# Patient Record
Sex: Male | Born: 1940
Health system: Southern US, Community
[De-identification: ages and names within clinical notes are randomized; demographics above are authoritative.]

## PROBLEM LIST (undated history)

## (undated) DIAGNOSIS — Z8719 Personal history of other diseases of the digestive system: Secondary | ICD-10-CM

## (undated) DIAGNOSIS — N2589 Other disorders resulting from impaired renal tubular function: Secondary | ICD-10-CM

## (undated) DIAGNOSIS — B029 Zoster without complications: Secondary | ICD-10-CM

## (undated) DIAGNOSIS — D696 Thrombocytopenia, unspecified: Secondary | ICD-10-CM

## (undated) DIAGNOSIS — D649 Anemia, unspecified: Secondary | ICD-10-CM

## (undated) DIAGNOSIS — N400 Enlarged prostate without lower urinary tract symptoms: Secondary | ICD-10-CM

## (undated) DIAGNOSIS — Z8614 Personal history of Methicillin resistant Staphylococcus aureus infection: Secondary | ICD-10-CM

## (undated) DIAGNOSIS — K5090 Crohn's disease, unspecified, without complications: Secondary | ICD-10-CM

## (undated) DIAGNOSIS — F32A Depression, unspecified: Secondary | ICD-10-CM

## (undated) DIAGNOSIS — T7840XA Allergy, unspecified, initial encounter: Secondary | ICD-10-CM

## (undated) DIAGNOSIS — N200 Calculus of kidney: Secondary | ICD-10-CM

## (undated) DIAGNOSIS — G4700 Insomnia, unspecified: Secondary | ICD-10-CM

## (undated) DIAGNOSIS — C4490 Unspecified malignant neoplasm of skin, unspecified: Secondary | ICD-10-CM

## (undated) DIAGNOSIS — R001 Bradycardia, unspecified: Secondary | ICD-10-CM

## (undated) DIAGNOSIS — Z860100 Personal history of colon polyps, unspecified: Secondary | ICD-10-CM

## (undated) DIAGNOSIS — R011 Cardiac murmur, unspecified: Secondary | ICD-10-CM

## (undated) DIAGNOSIS — I351 Nonrheumatic aortic (valve) insufficiency: Secondary | ICD-10-CM

## (undated) DIAGNOSIS — I712 Thoracic aortic aneurysm, without rupture: Secondary | ICD-10-CM

## (undated) DIAGNOSIS — F4310 Post-traumatic stress disorder, unspecified: Secondary | ICD-10-CM

## (undated) DIAGNOSIS — Z973 Presence of spectacles and contact lenses: Secondary | ICD-10-CM

## (undated) DIAGNOSIS — G629 Polyneuropathy, unspecified: Secondary | ICD-10-CM

## (undated) DIAGNOSIS — R82992 Hyperoxaluria: Secondary | ICD-10-CM

## (undated) DIAGNOSIS — L719 Rosacea, unspecified: Secondary | ICD-10-CM

## (undated) DIAGNOSIS — I10 Essential (primary) hypertension: Secondary | ICD-10-CM

## (undated) DIAGNOSIS — D689 Coagulation defect, unspecified: Secondary | ICD-10-CM

## (undated) DIAGNOSIS — H9190 Unspecified hearing loss, unspecified ear: Secondary | ICD-10-CM

## (undated) DIAGNOSIS — Z86711 Personal history of pulmonary embolism: Secondary | ICD-10-CM

## (undated) DIAGNOSIS — Z8619 Personal history of other infectious and parasitic diseases: Secondary | ICD-10-CM

## (undated) DIAGNOSIS — R911 Solitary pulmonary nodule: Secondary | ICD-10-CM

## (undated) DIAGNOSIS — K648 Other hemorrhoids: Secondary | ICD-10-CM

## (undated) DIAGNOSIS — A439 Nocardiosis, unspecified: Secondary | ICD-10-CM

## (undated) DIAGNOSIS — I2510 Atherosclerotic heart disease of native coronary artery without angina pectoris: Secondary | ICD-10-CM

## (undated) DIAGNOSIS — K90829 Short bowel syndrome, unspecified: Secondary | ICD-10-CM

## (undated) DIAGNOSIS — J189 Pneumonia, unspecified organism: Secondary | ICD-10-CM

## (undated) DIAGNOSIS — N2581 Secondary hyperparathyroidism of renal origin: Secondary | ICD-10-CM

## (undated) DIAGNOSIS — G2581 Restless legs syndrome: Secondary | ICD-10-CM

## (undated) DIAGNOSIS — K912 Postsurgical malabsorption, not elsewhere classified: Secondary | ICD-10-CM

## (undated) DIAGNOSIS — C189 Malignant neoplasm of colon, unspecified: Secondary | ICD-10-CM

## (undated) DIAGNOSIS — K859 Acute pancreatitis without necrosis or infection, unspecified: Secondary | ICD-10-CM

## (undated) DIAGNOSIS — E039 Hypothyroidism, unspecified: Secondary | ICD-10-CM

## (undated) DIAGNOSIS — Z9289 Personal history of other medical treatment: Secondary | ICD-10-CM

## (undated) DIAGNOSIS — E079 Disorder of thyroid, unspecified: Secondary | ICD-10-CM

## (undated) DIAGNOSIS — F329 Major depressive disorder, single episode, unspecified: Secondary | ICD-10-CM

## (undated) DIAGNOSIS — B1920 Unspecified viral hepatitis C without hepatic coma: Secondary | ICD-10-CM

## (undated) DIAGNOSIS — F419 Anxiety disorder, unspecified: Secondary | ICD-10-CM

## (undated) DIAGNOSIS — I4891 Unspecified atrial fibrillation: Secondary | ICD-10-CM

## (undated) DIAGNOSIS — M1990 Unspecified osteoarthritis, unspecified site: Secondary | ICD-10-CM

## (undated) DIAGNOSIS — K449 Diaphragmatic hernia without obstruction or gangrene: Secondary | ICD-10-CM

## (undated) DIAGNOSIS — Z87442 Personal history of urinary calculi: Secondary | ICD-10-CM

## (undated) DIAGNOSIS — G4730 Sleep apnea, unspecified: Secondary | ICD-10-CM

## (undated) DIAGNOSIS — K219 Gastro-esophageal reflux disease without esophagitis: Secondary | ICD-10-CM

## (undated) DIAGNOSIS — E538 Deficiency of other specified B group vitamins: Secondary | ICD-10-CM

## (undated) DIAGNOSIS — H269 Unspecified cataract: Secondary | ICD-10-CM

## (undated) DIAGNOSIS — I519 Heart disease, unspecified: Secondary | ICD-10-CM

## (undated) DIAGNOSIS — K922 Gastrointestinal hemorrhage, unspecified: Secondary | ICD-10-CM

## (undated) DIAGNOSIS — R319 Hematuria, unspecified: Secondary | ICD-10-CM

## (undated) DIAGNOSIS — N183 Chronic kidney disease, stage 3 (moderate): Secondary | ICD-10-CM

## (undated) DIAGNOSIS — I7121 Aneurysm of the ascending aorta, without rupture: Secondary | ICD-10-CM

## (undated) DIAGNOSIS — K56609 Unspecified intestinal obstruction, unspecified as to partial versus complete obstruction: Secondary | ICD-10-CM

## (undated) HISTORY — PX: ILEOSTOMY: SHX1783

## (undated) HISTORY — PX: EXTRACORPOREAL SHOCK WAVE LITHOTRIPSY: SHX1557

## (undated) HISTORY — DX: Hyperoxaluria: R82.992

## (undated) HISTORY — DX: Sleep apnea, unspecified: G47.30

## (undated) HISTORY — PX: ESOPHAGOGASTRODUODENOSCOPY: SHX1529

## (undated) HISTORY — PX: HEMICOLECTOMY: SHX854

## (undated) HISTORY — PX: EYE SURGERY: SHX253

## (undated) HISTORY — DX: Diaphragmatic hernia without obstruction or gangrene: K44.9

## (undated) HISTORY — DX: Essential (primary) hypertension: I10

## (undated) HISTORY — PX: COLONOSCOPY: SHX174

## (undated) HISTORY — DX: Restless legs syndrome: G25.81

## (undated) HISTORY — DX: Coagulation defect, unspecified: D68.9

## (undated) HISTORY — PX: OTHER SURGICAL HISTORY: SHX169

## (undated) HISTORY — PX: LIGAMENT REPAIR: SHX5444

## (undated) HISTORY — DX: Secondary hyperparathyroidism of renal origin: N25.81

## (undated) HISTORY — PX: BOWEL RESECTION: SHX1257

## (undated) HISTORY — DX: Gastrointestinal hemorrhage, unspecified: K92.2

## (undated) HISTORY — DX: Nocardiosis, unspecified: A43.9

## (undated) HISTORY — DX: Personal history of other diseases of the digestive system: Z87.19

## (undated) HISTORY — DX: Anxiety disorder, unspecified: F41.9

## (undated) HISTORY — PX: INGUINAL HERNIA REPAIR: SUR1180

## (undated) HISTORY — PX: CHOLECYSTECTOMY: SHX55

## (undated) HISTORY — PX: ANKLE SURGERY: SHX546

## (undated) HISTORY — DX: Post-traumatic stress disorder, unspecified: F43.10

## (undated) HISTORY — DX: Disorder of thyroid, unspecified: E07.9

## (undated) HISTORY — DX: Unspecified intestinal obstruction, unspecified as to partial versus complete obstruction: K56.609

## (undated) HISTORY — PX: COLON SURGERY: SHX602

## (undated) HISTORY — DX: Unspecified cataract: H26.9

## (undated) HISTORY — DX: Deficiency of other specified B group vitamins: E53.8

## (undated) HISTORY — PX: POLYPECTOMY: SHX149

## (undated) HISTORY — DX: Other hemorrhoids: K64.8

## (undated) HISTORY — DX: Zoster without complications: B02.9

## (undated) HISTORY — PX: FOOT SURGERY: SHX648

## (undated) HISTORY — DX: Gastro-esophageal reflux disease without esophagitis: K21.9

## (undated) HISTORY — DX: Allergy, unspecified, initial encounter: T78.40XA

## (undated) HISTORY — DX: Crohn's disease, unspecified, without complications: K50.90

## (undated) HISTORY — PX: KNEE ARTHROSCOPY: SUR90

## (undated) HISTORY — DX: Calculus of kidney: N20.0

## (undated) HISTORY — DX: Personal history of pulmonary embolism: Z86.711

## (undated) HISTORY — DX: Thrombocytopenia, unspecified: D69.6

---

## 1976-08-03 DIAGNOSIS — B1920 Unspecified viral hepatitis C without hepatic coma: Secondary | ICD-10-CM

## 1976-08-03 DIAGNOSIS — K56609 Unspecified intestinal obstruction, unspecified as to partial versus complete obstruction: Secondary | ICD-10-CM

## 1976-08-03 DIAGNOSIS — Z8619 Personal history of other infectious and parasitic diseases: Secondary | ICD-10-CM

## 1976-08-03 HISTORY — DX: Personal history of other infectious and parasitic diseases: Z86.19

## 1976-08-03 HISTORY — DX: Unspecified viral hepatitis C without hepatic coma: B19.20

## 1976-08-03 HISTORY — DX: Unspecified intestinal obstruction, unspecified as to partial versus complete obstruction: K56.609

## 1976-08-03 HISTORY — PX: APPENDECTOMY: SHX54

## 1976-08-03 HISTORY — PX: ILEOCECETOMY: SHX5857

## 1989-04-03 DIAGNOSIS — J189 Pneumonia, unspecified organism: Secondary | ICD-10-CM

## 1989-04-03 HISTORY — DX: Pneumonia, unspecified organism: J18.9

## 1998-01-03 ENCOUNTER — Other Ambulatory Visit: Admission: RE | Admit: 1998-01-03 | Discharge: 1998-01-03 | Payer: Self-pay | Admitting: Urology

## 1998-01-21 ENCOUNTER — Ambulatory Visit (HOSPITAL_COMMUNITY): Admission: RE | Admit: 1998-01-21 | Discharge: 1998-01-21 | Payer: Self-pay | Admitting: Urology

## 1998-05-21 ENCOUNTER — Encounter: Payer: Self-pay | Admitting: Surgery

## 1998-05-23 ENCOUNTER — Inpatient Hospital Stay (HOSPITAL_COMMUNITY): Admission: EM | Admit: 1998-05-23 | Discharge: 1998-05-26 | Payer: Self-pay | Admitting: Surgery

## 1999-06-29 ENCOUNTER — Emergency Department (HOSPITAL_COMMUNITY): Admission: EM | Admit: 1999-06-29 | Discharge: 1999-06-29 | Payer: Self-pay | Admitting: Emergency Medicine

## 1999-06-30 ENCOUNTER — Encounter: Admission: RE | Admit: 1999-06-30 | Discharge: 1999-06-30 | Payer: Self-pay | Admitting: Urology

## 1999-06-30 ENCOUNTER — Encounter: Payer: Self-pay | Admitting: Urology

## 1999-07-02 ENCOUNTER — Encounter: Payer: Self-pay | Admitting: Urology

## 1999-07-04 ENCOUNTER — Inpatient Hospital Stay (HOSPITAL_COMMUNITY): Admission: RE | Admit: 1999-07-04 | Discharge: 1999-07-06 | Payer: Self-pay | Admitting: Urology

## 1999-07-04 ENCOUNTER — Encounter (INDEPENDENT_AMBULATORY_CARE_PROVIDER_SITE_OTHER): Payer: Self-pay | Admitting: Specialist

## 1999-07-04 ENCOUNTER — Encounter: Payer: Self-pay | Admitting: Urology

## 1999-09-30 ENCOUNTER — Encounter (INDEPENDENT_AMBULATORY_CARE_PROVIDER_SITE_OTHER): Payer: Self-pay

## 1999-09-30 ENCOUNTER — Other Ambulatory Visit: Admission: RE | Admit: 1999-09-30 | Discharge: 1999-09-30 | Payer: Self-pay | Admitting: Internal Medicine

## 1999-11-15 ENCOUNTER — Encounter: Payer: Self-pay | Admitting: Emergency Medicine

## 1999-11-15 ENCOUNTER — Emergency Department (HOSPITAL_COMMUNITY): Admission: EM | Admit: 1999-11-15 | Discharge: 1999-11-15 | Payer: Self-pay | Admitting: Emergency Medicine

## 2000-05-10 ENCOUNTER — Inpatient Hospital Stay (HOSPITAL_COMMUNITY): Admission: EM | Admit: 2000-05-10 | Discharge: 2000-05-13 | Payer: Self-pay

## 2000-05-10 ENCOUNTER — Encounter: Payer: Self-pay | Admitting: Emergency Medicine

## 2000-08-23 ENCOUNTER — Encounter: Payer: Self-pay | Admitting: Urology

## 2000-08-23 ENCOUNTER — Encounter: Admission: RE | Admit: 2000-08-23 | Discharge: 2000-08-23 | Payer: Self-pay | Admitting: Urology

## 2001-01-01 ENCOUNTER — Emergency Department (HOSPITAL_COMMUNITY): Admission: EM | Admit: 2001-01-01 | Discharge: 2001-01-01 | Payer: Self-pay | Admitting: *Deleted

## 2001-04-05 ENCOUNTER — Ambulatory Visit (HOSPITAL_COMMUNITY): Admission: RE | Admit: 2001-04-05 | Discharge: 2001-04-05 | Payer: Self-pay | Admitting: Orthopedic Surgery

## 2001-04-05 ENCOUNTER — Encounter: Payer: Self-pay | Admitting: Orthopedic Surgery

## 2001-11-26 ENCOUNTER — Emergency Department (HOSPITAL_COMMUNITY): Admission: EM | Admit: 2001-11-26 | Discharge: 2001-11-27 | Payer: Self-pay | Admitting: *Deleted

## 2002-09-29 ENCOUNTER — Encounter: Payer: Self-pay | Admitting: Internal Medicine

## 2002-09-29 ENCOUNTER — Ambulatory Visit (HOSPITAL_COMMUNITY): Admission: RE | Admit: 2002-09-29 | Discharge: 2002-09-29 | Payer: Self-pay | Admitting: Internal Medicine

## 2002-10-13 ENCOUNTER — Ambulatory Visit (HOSPITAL_COMMUNITY): Admission: RE | Admit: 2002-10-13 | Discharge: 2002-10-13 | Payer: Self-pay | Admitting: Internal Medicine

## 2002-10-13 ENCOUNTER — Encounter: Payer: Self-pay | Admitting: Internal Medicine

## 2003-04-23 ENCOUNTER — Ambulatory Visit (HOSPITAL_COMMUNITY): Admission: RE | Admit: 2003-04-23 | Discharge: 2003-04-23 | Payer: Self-pay | Admitting: Family Medicine

## 2003-04-23 ENCOUNTER — Encounter: Payer: Self-pay | Admitting: Family Medicine

## 2004-01-23 ENCOUNTER — Encounter (INDEPENDENT_AMBULATORY_CARE_PROVIDER_SITE_OTHER): Payer: Self-pay | Admitting: *Deleted

## 2004-01-23 ENCOUNTER — Ambulatory Visit (HOSPITAL_COMMUNITY): Admission: RE | Admit: 2004-01-23 | Discharge: 2004-01-23 | Payer: Self-pay | Admitting: Specialist

## 2004-08-03 DIAGNOSIS — Z86711 Personal history of pulmonary embolism: Secondary | ICD-10-CM

## 2004-08-03 HISTORY — DX: Personal history of pulmonary embolism: Z86.711

## 2004-09-17 ENCOUNTER — Ambulatory Visit: Payer: Self-pay | Admitting: Internal Medicine

## 2004-09-22 ENCOUNTER — Other Ambulatory Visit: Admission: RE | Admit: 2004-09-22 | Discharge: 2004-09-22 | Payer: Self-pay | Admitting: Dermatology

## 2004-09-23 ENCOUNTER — Ambulatory Visit: Payer: Self-pay | Admitting: Internal Medicine

## 2004-10-02 ENCOUNTER — Ambulatory Visit: Payer: Self-pay | Admitting: Gastroenterology

## 2004-10-02 ENCOUNTER — Inpatient Hospital Stay (HOSPITAL_COMMUNITY): Admission: EM | Admit: 2004-10-02 | Discharge: 2004-10-12 | Payer: Self-pay | Admitting: Emergency Medicine

## 2004-10-03 ENCOUNTER — Encounter (INDEPENDENT_AMBULATORY_CARE_PROVIDER_SITE_OTHER): Payer: Self-pay | Admitting: *Deleted

## 2004-10-03 ENCOUNTER — Ambulatory Visit: Payer: Self-pay | Admitting: Oncology

## 2004-10-07 ENCOUNTER — Encounter: Payer: Self-pay | Admitting: Gastroenterology

## 2004-10-07 ENCOUNTER — Encounter (INDEPENDENT_AMBULATORY_CARE_PROVIDER_SITE_OTHER): Payer: Self-pay | Admitting: Specialist

## 2004-10-13 ENCOUNTER — Ambulatory Visit: Payer: Self-pay | Admitting: Oncology

## 2004-10-14 ENCOUNTER — Inpatient Hospital Stay (HOSPITAL_COMMUNITY): Admission: AD | Admit: 2004-10-14 | Discharge: 2004-10-20 | Payer: Self-pay | Admitting: Family Medicine

## 2004-10-22 ENCOUNTER — Ambulatory Visit: Payer: Self-pay | Admitting: Internal Medicine

## 2004-10-23 ENCOUNTER — Ambulatory Visit (HOSPITAL_COMMUNITY): Admission: RE | Admit: 2004-10-23 | Discharge: 2004-10-23 | Payer: Self-pay | Admitting: Family Medicine

## 2004-11-10 ENCOUNTER — Ambulatory Visit (HOSPITAL_COMMUNITY): Admission: RE | Admit: 2004-11-10 | Discharge: 2004-11-10 | Payer: Self-pay | Admitting: *Deleted

## 2005-02-05 ENCOUNTER — Ambulatory Visit: Payer: Self-pay | Admitting: Oncology

## 2005-02-16 ENCOUNTER — Ambulatory Visit: Payer: Self-pay | Admitting: Internal Medicine

## 2005-03-24 ENCOUNTER — Ambulatory Visit: Payer: Self-pay | Admitting: Internal Medicine

## 2005-05-07 ENCOUNTER — Ambulatory Visit: Payer: Self-pay | Admitting: Oncology

## 2005-05-29 ENCOUNTER — Ambulatory Visit: Payer: Self-pay | Admitting: Cardiology

## 2005-06-08 ENCOUNTER — Encounter (HOSPITAL_COMMUNITY): Admission: RE | Admit: 2005-06-08 | Discharge: 2005-07-08 | Payer: Self-pay | Admitting: Family Medicine

## 2005-06-08 ENCOUNTER — Ambulatory Visit (HOSPITAL_COMMUNITY): Admission: RE | Admit: 2005-06-08 | Discharge: 2005-06-08 | Payer: Self-pay | Admitting: Family Medicine

## 2005-06-08 ENCOUNTER — Ambulatory Visit (HOSPITAL_COMMUNITY): Payer: Self-pay | Admitting: Family Medicine

## 2005-07-05 ENCOUNTER — Emergency Department (HOSPITAL_COMMUNITY): Admission: EM | Admit: 2005-07-05 | Discharge: 2005-07-05 | Payer: Self-pay | Admitting: Emergency Medicine

## 2005-07-09 ENCOUNTER — Ambulatory Visit (HOSPITAL_COMMUNITY): Admission: RE | Admit: 2005-07-09 | Discharge: 2005-07-09 | Payer: Self-pay | Admitting: Urology

## 2005-07-09 ENCOUNTER — Ambulatory Visit (HOSPITAL_BASED_OUTPATIENT_CLINIC_OR_DEPARTMENT_OTHER): Admission: RE | Admit: 2005-07-09 | Discharge: 2005-07-09 | Payer: Self-pay | Admitting: Urology

## 2005-07-16 ENCOUNTER — Ambulatory Visit (HOSPITAL_COMMUNITY): Admission: RE | Admit: 2005-07-16 | Discharge: 2005-07-16 | Payer: Self-pay | Admitting: Urology

## 2005-07-17 ENCOUNTER — Ambulatory Visit: Payer: Self-pay | Admitting: Oncology

## 2005-10-12 ENCOUNTER — Ambulatory Visit: Payer: Self-pay | Admitting: Internal Medicine

## 2005-11-24 ENCOUNTER — Ambulatory Visit: Payer: Self-pay | Admitting: Cardiology

## 2005-12-18 ENCOUNTER — Encounter: Payer: Self-pay | Admitting: Cardiology

## 2005-12-18 ENCOUNTER — Ambulatory Visit: Payer: Self-pay

## 2006-01-08 ENCOUNTER — Ambulatory Visit (HOSPITAL_COMMUNITY): Admission: RE | Admit: 2006-01-08 | Discharge: 2006-01-08 | Payer: Self-pay | Admitting: Specialist

## 2006-01-08 ENCOUNTER — Ambulatory Visit: Payer: Self-pay | Admitting: Oncology

## 2006-01-15 LAB — PROTIME-INR
INR: 2.2 (ref 2.00–3.50)
Protime: 18.2 Seconds — ABNORMAL HIGH (ref 10.6–13.4)

## 2006-01-18 LAB — CBC WITH DIFFERENTIAL/PLATELET
Basophils Absolute: 0 10*3/uL (ref 0.0–0.1)
EOS%: 1.8 % (ref 0.0–7.0)
Eosinophils Absolute: 0.1 10*3/uL (ref 0.0–0.5)
HGB: 13.4 g/dL (ref 13.0–17.1)
MCV: 93.2 fL (ref 81.6–98.0)
MONO%: 6.7 % (ref 0.0–13.0)
NEUT#: 3.1 10*3/uL (ref 1.5–6.5)
RBC: 4.18 10*6/uL — ABNORMAL LOW (ref 4.20–5.71)
RDW: 15 % — ABNORMAL HIGH (ref 11.2–14.6)
lymph#: 0.9 10*3/uL (ref 0.9–3.3)

## 2006-01-18 LAB — COMPREHENSIVE METABOLIC PANEL
AST: 20 U/L (ref 0–37)
Albumin: 4.1 g/dL (ref 3.5–5.2)
Alkaline Phosphatase: 125 U/L — ABNORMAL HIGH (ref 39–117)
BUN: 20 mg/dL (ref 6–23)
Calcium: 8.6 mg/dL (ref 8.4–10.5)
Chloride: 111 mEq/L (ref 96–112)
Glucose, Bld: 101 mg/dL — ABNORMAL HIGH (ref 70–99)
Potassium: 4.6 mEq/L (ref 3.5–5.3)
Sodium: 143 mEq/L (ref 135–145)
Total Protein: 6.5 g/dL (ref 6.0–8.3)

## 2006-01-18 LAB — PROTIME-INR

## 2006-05-28 ENCOUNTER — Ambulatory Visit: Payer: Self-pay | Admitting: Cardiology

## 2006-06-08 ENCOUNTER — Encounter: Payer: Self-pay | Admitting: Cardiology

## 2006-07-20 ENCOUNTER — Ambulatory Visit: Payer: Self-pay | Admitting: Oncology

## 2006-07-21 ENCOUNTER — Ambulatory Visit: Payer: Self-pay | Admitting: Internal Medicine

## 2006-07-23 LAB — COMPREHENSIVE METABOLIC PANEL
ALT: 35 U/L (ref 0–53)
AST: 19 U/L (ref 0–37)
Albumin: 4.3 g/dL (ref 3.5–5.2)
Alkaline Phosphatase: 151 U/L — ABNORMAL HIGH (ref 39–117)
Potassium: 4.3 mEq/L (ref 3.5–5.3)
Sodium: 140 mEq/L (ref 135–145)
Total Bilirubin: 0.7 mg/dL (ref 0.3–1.2)
Total Protein: 6.5 g/dL (ref 6.0–8.3)

## 2006-07-23 LAB — CBC WITH DIFFERENTIAL/PLATELET
BASO%: 0.9 % (ref 0.0–2.0)
EOS%: 1.3 % (ref 0.0–7.0)
Eosinophils Absolute: 0.1 10*3/uL (ref 0.0–0.5)
LYMPH%: 16.8 % (ref 14.0–48.0)
MCH: 31.9 pg (ref 28.0–33.4)
MCHC: 34.8 g/dL (ref 32.0–35.9)
MCV: 91.6 fL (ref 81.6–98.0)
MONO%: 6.2 % (ref 0.0–13.0)
NEUT#: 3.1 10*3/uL (ref 1.5–6.5)
RBC: 4.35 10*6/uL (ref 4.20–5.71)
RDW: 14.8 % — ABNORMAL HIGH (ref 11.2–14.6)

## 2006-08-14 ENCOUNTER — Emergency Department (HOSPITAL_COMMUNITY): Admission: EM | Admit: 2006-08-14 | Discharge: 2006-08-14 | Payer: Self-pay | Admitting: Emergency Medicine

## 2006-12-09 ENCOUNTER — Ambulatory Visit: Payer: Self-pay | Admitting: Cardiology

## 2007-02-14 ENCOUNTER — Ambulatory Visit (HOSPITAL_BASED_OUTPATIENT_CLINIC_OR_DEPARTMENT_OTHER): Admission: RE | Admit: 2007-02-14 | Discharge: 2007-02-14 | Payer: Self-pay | Admitting: Specialist

## 2007-07-12 ENCOUNTER — Ambulatory Visit: Payer: Self-pay | Admitting: Cardiology

## 2007-07-26 ENCOUNTER — Encounter: Payer: Self-pay | Admitting: Physician Assistant

## 2007-07-26 ENCOUNTER — Ambulatory Visit: Payer: Self-pay | Admitting: Cardiology

## 2007-09-14 ENCOUNTER — Ambulatory Visit: Payer: Self-pay | Admitting: Internal Medicine

## 2007-09-20 ENCOUNTER — Ambulatory Visit (HOSPITAL_COMMUNITY): Admission: RE | Admit: 2007-09-20 | Discharge: 2007-09-20 | Payer: Self-pay | Admitting: Internal Medicine

## 2008-05-08 ENCOUNTER — Ambulatory Visit: Payer: Self-pay | Admitting: Cardiology

## 2008-05-09 ENCOUNTER — Ambulatory Visit: Payer: Self-pay | Admitting: Internal Medicine

## 2008-05-09 DIAGNOSIS — F4310 Post-traumatic stress disorder, unspecified: Secondary | ICD-10-CM

## 2008-05-09 DIAGNOSIS — E538 Deficiency of other specified B group vitamins: Secondary | ICD-10-CM | POA: Insufficient documentation

## 2008-05-09 DIAGNOSIS — M109 Gout, unspecified: Secondary | ICD-10-CM

## 2008-05-09 DIAGNOSIS — K219 Gastro-esophageal reflux disease without esophagitis: Secondary | ICD-10-CM | POA: Insufficient documentation

## 2008-05-09 DIAGNOSIS — Z8619 Personal history of other infectious and parasitic diseases: Secondary | ICD-10-CM

## 2008-05-09 DIAGNOSIS — Z86718 Personal history of other venous thrombosis and embolism: Secondary | ICD-10-CM

## 2008-05-09 DIAGNOSIS — D61818 Other pancytopenia: Secondary | ICD-10-CM

## 2008-05-09 DIAGNOSIS — Z8719 Personal history of other diseases of the digestive system: Secondary | ICD-10-CM | POA: Insufficient documentation

## 2008-05-09 HISTORY — DX: Post-traumatic stress disorder, unspecified: F43.10

## 2008-05-09 HISTORY — DX: Gout, unspecified: M10.9

## 2008-05-14 ENCOUNTER — Ambulatory Visit: Payer: Self-pay | Admitting: Internal Medicine

## 2008-05-15 ENCOUNTER — Encounter: Payer: Self-pay | Admitting: Internal Medicine

## 2008-05-16 ENCOUNTER — Encounter: Payer: Self-pay | Admitting: Internal Medicine

## 2008-05-17 ENCOUNTER — Ambulatory Visit (HOSPITAL_COMMUNITY): Admission: RE | Admit: 2008-05-17 | Discharge: 2008-05-17 | Payer: Self-pay | Admitting: Sports Medicine

## 2008-05-22 ENCOUNTER — Ambulatory Visit: Payer: Self-pay | Admitting: Internal Medicine

## 2008-05-24 ENCOUNTER — Telehealth: Payer: Self-pay | Admitting: Internal Medicine

## 2008-05-28 ENCOUNTER — Encounter: Payer: Self-pay | Admitting: Internal Medicine

## 2008-06-04 ENCOUNTER — Telehealth: Payer: Self-pay | Admitting: Internal Medicine

## 2008-06-21 ENCOUNTER — Telehealth: Payer: Self-pay | Admitting: Internal Medicine

## 2008-07-11 ENCOUNTER — Telehealth: Payer: Self-pay | Admitting: Internal Medicine

## 2008-07-13 ENCOUNTER — Ambulatory Visit (HOSPITAL_BASED_OUTPATIENT_CLINIC_OR_DEPARTMENT_OTHER): Admission: RE | Admit: 2008-07-13 | Discharge: 2008-07-13 | Payer: Self-pay | Admitting: Specialist

## 2008-07-13 ENCOUNTER — Encounter (INDEPENDENT_AMBULATORY_CARE_PROVIDER_SITE_OTHER): Payer: Self-pay | Admitting: Specialist

## 2008-08-03 DIAGNOSIS — Z8614 Personal history of Methicillin resistant Staphylococcus aureus infection: Secondary | ICD-10-CM

## 2008-08-03 DIAGNOSIS — K859 Acute pancreatitis without necrosis or infection, unspecified: Secondary | ICD-10-CM

## 2008-08-03 HISTORY — DX: Acute pancreatitis without necrosis or infection, unspecified: K85.90

## 2008-08-03 HISTORY — DX: Personal history of Methicillin resistant Staphylococcus aureus infection: Z86.14

## 2008-08-25 ENCOUNTER — Inpatient Hospital Stay (HOSPITAL_COMMUNITY): Admission: EM | Admit: 2008-08-25 | Discharge: 2008-08-29 | Payer: Self-pay | Admitting: Emergency Medicine

## 2008-08-25 ENCOUNTER — Telehealth: Payer: Self-pay | Admitting: Gastroenterology

## 2008-08-25 ENCOUNTER — Ambulatory Visit: Payer: Self-pay | Admitting: Gastroenterology

## 2008-08-26 ENCOUNTER — Encounter: Payer: Self-pay | Admitting: Gastroenterology

## 2008-08-27 ENCOUNTER — Encounter: Payer: Self-pay | Admitting: Gastroenterology

## 2008-09-03 ENCOUNTER — Encounter: Payer: Self-pay | Admitting: Internal Medicine

## 2008-09-21 ENCOUNTER — Ambulatory Visit: Payer: Self-pay | Admitting: Internal Medicine

## 2008-10-04 ENCOUNTER — Telehealth: Payer: Self-pay | Admitting: Internal Medicine

## 2008-10-09 ENCOUNTER — Telehealth: Payer: Self-pay | Admitting: Internal Medicine

## 2008-10-10 ENCOUNTER — Telehealth: Payer: Self-pay | Admitting: Internal Medicine

## 2008-10-12 ENCOUNTER — Telehealth: Payer: Self-pay | Admitting: Internal Medicine

## 2008-10-17 ENCOUNTER — Ambulatory Visit: Payer: Self-pay | Admitting: Gastroenterology

## 2008-11-12 ENCOUNTER — Ambulatory Visit: Payer: Self-pay | Admitting: Internal Medicine

## 2008-12-03 ENCOUNTER — Encounter: Payer: Self-pay | Admitting: Internal Medicine

## 2008-12-19 ENCOUNTER — Ambulatory Visit: Payer: Self-pay | Admitting: Cardiology

## 2008-12-27 ENCOUNTER — Inpatient Hospital Stay (HOSPITAL_COMMUNITY): Admission: AD | Admit: 2008-12-27 | Discharge: 2008-12-30 | Payer: Self-pay | Admitting: Internal Medicine

## 2008-12-27 ENCOUNTER — Ambulatory Visit: Payer: Self-pay | Admitting: Internal Medicine

## 2008-12-27 DIAGNOSIS — K5000 Crohn's disease of small intestine without complications: Secondary | ICD-10-CM

## 2008-12-28 ENCOUNTER — Encounter: Payer: Self-pay | Admitting: Internal Medicine

## 2008-12-28 ENCOUNTER — Encounter (INDEPENDENT_AMBULATORY_CARE_PROVIDER_SITE_OTHER): Payer: Self-pay | Admitting: *Deleted

## 2009-01-08 ENCOUNTER — Ambulatory Visit: Payer: Self-pay | Admitting: Internal Medicine

## 2009-01-08 DIAGNOSIS — K859 Acute pancreatitis without necrosis or infection, unspecified: Secondary | ICD-10-CM | POA: Insufficient documentation

## 2009-02-06 ENCOUNTER — Telehealth: Payer: Self-pay | Admitting: Internal Medicine

## 2009-02-08 ENCOUNTER — Encounter: Payer: Self-pay | Admitting: Internal Medicine

## 2009-02-13 ENCOUNTER — Encounter: Payer: Self-pay | Admitting: Internal Medicine

## 2009-02-27 ENCOUNTER — Ambulatory Visit: Payer: Self-pay | Admitting: Internal Medicine

## 2009-04-01 ENCOUNTER — Encounter: Payer: Self-pay | Admitting: Internal Medicine

## 2009-04-26 ENCOUNTER — Ambulatory Visit: Payer: Self-pay | Admitting: Internal Medicine

## 2009-05-21 ENCOUNTER — Ambulatory Visit: Payer: Self-pay | Admitting: Internal Medicine

## 2009-06-24 ENCOUNTER — Ambulatory Visit: Payer: Self-pay | Admitting: Internal Medicine

## 2009-06-26 ENCOUNTER — Ambulatory Visit: Payer: Self-pay | Admitting: Cardiology

## 2009-06-26 DIAGNOSIS — K5080 Crohn's disease of both small and large intestine without complications: Secondary | ICD-10-CM

## 2009-06-26 DIAGNOSIS — R079 Chest pain, unspecified: Secondary | ICD-10-CM | POA: Insufficient documentation

## 2009-06-26 DIAGNOSIS — I712 Thoracic aortic aneurysm, without rupture: Secondary | ICD-10-CM | POA: Insufficient documentation

## 2009-06-26 HISTORY — DX: Crohn's disease of both small and large intestine without complications: K50.80

## 2009-07-11 ENCOUNTER — Encounter: Payer: Self-pay | Admitting: Cardiology

## 2009-07-17 ENCOUNTER — Encounter (INDEPENDENT_AMBULATORY_CARE_PROVIDER_SITE_OTHER): Payer: Self-pay | Admitting: *Deleted

## 2009-08-08 ENCOUNTER — Telehealth: Payer: Self-pay | Admitting: Internal Medicine

## 2009-09-13 ENCOUNTER — Ambulatory Visit: Payer: Self-pay | Admitting: Internal Medicine

## 2009-11-21 ENCOUNTER — Encounter: Payer: Self-pay | Admitting: Internal Medicine

## 2009-12-12 ENCOUNTER — Ambulatory Visit: Payer: Self-pay | Admitting: Cardiology

## 2010-03-24 ENCOUNTER — Encounter: Payer: Self-pay | Admitting: Internal Medicine

## 2010-04-10 ENCOUNTER — Emergency Department (HOSPITAL_COMMUNITY): Admission: EM | Admit: 2010-04-10 | Discharge: 2010-04-10 | Payer: Self-pay | Admitting: Emergency Medicine

## 2010-04-17 ENCOUNTER — Ambulatory Visit: Payer: Self-pay | Admitting: Internal Medicine

## 2010-05-15 ENCOUNTER — Telehealth: Payer: Self-pay | Admitting: Internal Medicine

## 2010-06-04 ENCOUNTER — Encounter: Payer: Self-pay | Admitting: Cardiology

## 2010-06-16 ENCOUNTER — Ambulatory Visit: Payer: Self-pay | Admitting: Cardiology

## 2010-06-25 ENCOUNTER — Ambulatory Visit: Payer: Self-pay | Admitting: Cardiology

## 2010-06-25 ENCOUNTER — Encounter (INDEPENDENT_AMBULATORY_CARE_PROVIDER_SITE_OTHER): Payer: Self-pay | Admitting: *Deleted

## 2010-06-25 DIAGNOSIS — I2510 Atherosclerotic heart disease of native coronary artery without angina pectoris: Secondary | ICD-10-CM | POA: Insufficient documentation

## 2010-07-11 ENCOUNTER — Encounter: Payer: Self-pay | Admitting: Cardiology

## 2010-08-06 ENCOUNTER — Encounter: Payer: Self-pay | Admitting: Cardiology

## 2010-08-24 ENCOUNTER — Encounter: Payer: Self-pay | Admitting: *Deleted

## 2010-09-04 NOTE — Progress Notes (Signed)
Summary: Humira  Phone Note Outgoing Call   Call placed by: Vivia Ewing LPN,  August 08, 8754 9:58 AM Call placed to: Patient Summary of Call: Pt. was supposed to call me after he went to the [facility] appt. in 07/2009, he was to decide if he wanted to restart his Humira with the [facility] or through his private insurance. I called to get an update. Per Mrs.Stauder, pt. states he is doing fine, he wants to wait to start Humira, he wants to see Dr.Pearly Apachito in the office and discuss this with her, first. I offered several appts, pt. chose 09-11-09 at 1:30pm. Pt. instructed to call back as needed.  Initial call taken by: Vivia Ewing LPN,  August 08, 4330 10:01 AM  Follow-up for Phone Call        OK Follow-up by: Lafayette Dragon MD,  August 08, 2009 1:31 PM

## 2010-09-04 NOTE — Letter (Signed)
Summary: External Correspondence/ [facility]  External Correspondence/ [facility]   Imported By: Bartholomew Boards 08/21/2010 14:34:17  _____________________________________________________________________  External Attachment:    Type:   Image     Comment:   External Document

## 2010-09-04 NOTE — Letter (Signed)
Summary: [facility]  [facility]   Imported By: Bubba Hales 12/04/2009 10:43:23  _____________________________________________________________________  External Attachment:    Type:   Image     Comment:   External Document

## 2010-09-04 NOTE — Assessment & Plan Note (Signed)
Summary: 6 MO FU PER MAY REMINDER   Visit Type:  Follow-up Referring Provider:  n/a Primary Provider:  Bonne Dolores, MD  CC:  follow-up visit.  History of Present Illness: the patient is a 70 male with history of Crohn's disease, ascending aortic aneurysm, coronary artery disease and prior pulmonary emboli DVT. The patient had a recent CT scan done of the chest showing an ascending aortic aneurysm of 4.1 cm. The measurements have remained stable.  The patient denies any substernal chest pain, shortness of breath orthopnea PND he reports no palpitations or syncope.  Preventive Screening-Counseling & Management  Alcohol-Tobacco     Smoking Status: quit     Year Quit: 1985  Current Medications (verified): 1)  Terazosin Hcl 2 Mg Caps (Terazosin Hcl) .... One Tablet By Mouth At Bedtime 2)  Tums 500 Mg Chew (Calcium Carbonate Antacid) .... Chew 2 Tabs Two Times A Day 3)  Effer-K 10 Meq Tbef (Potassium Bicarb-Citric Acid) .Marland Kitchen.. 1 Tablet Two Times A Day 4)  Sm Aspirin Low Dose 81 Mg Tbec (Aspirin) .Marland Kitchen.. 1 Tablet Once Daily 5)  Trazodone Hcl 150 Mg Tabs (Trazodone Hcl) .Marland Kitchen.. 1 Tablet At Bedtime 6)  Atenolol 25 Mg Tabs (Atenolol) .Marland Kitchen.. 1 Tablet Once Daily 7)  Gabapentin 100 Mg Caps (Gabapentin) .Marland Kitchen.. 1 Tablet Two Times A Day 8)  Paxil 40 Mg Tabs (Paroxetine Hcl) .Marland Kitchen.. 1 Tablet At Bedtime 9)  Omeprazole 20 Mg Tbec (Omeprazole) .Marland Kitchen.. 1 Tablet Once Daily 10)  Magnesium Oxide 420 Mg Tabs (Magnesium Oxide) .... Take 1 Tablet By Mouth Two Times A Day 11)  Fish Oil   Oil (Fish Oil) .Marland Kitchen.. 1 Capsule Two Times A Day 12)  Multivitamins   Tabs (Multiple Vitamin) .Marland Kitchen.. 1 Tablet Once Daily 13)  Cyanocobalamin 1000 Mcg/ml Soln (Cyanocobalamin) .... Twice Per Month 14)  Colcrys 0.6 Mg Tabs (Colchicine) .... Take 1 Tablet By Mouth Once A Day 15)  Uloric 80 Mg Tabs (Febuxostat) .... One Tablet By Mouth Once Daily  Allergies (verified): 1)  ! Ativan  Comments:  Nurse/Medical Assistant: The patient's  medications and allergies were reviewed with the patient and were updated in the Medication and Allergy Lists. List reviewed.  Past History:  Past Medical History: Last updated: 06/26/2009 HIATAL HERNIA (ICD-553.3) GERD (ICD-530.81) GOUT(274.9) PULMONARY EMBOLISM, HX OF (ICD-V12.51) GASTRITIS, HX OF (ICD-V12.79) INTERNAL HEMORRHOIDS (ICD-455.0) POST TRAUMATIC STRESS SYNDROME (ICD-309.81) ABDOMINAL AORTIC ANEURYSM (ICD-441.4) HEPATITIS C, HX OF (ICD-V12.09) B12 DEFICIENCY (ICD-266.2) SMALL BOWEL OBSTRUCTION, HX OF (ICD-V12.79) Hx of PANCYTOPENIA (ICD-284.1) (ALLOPURINOL + 6MP) CROHN'S DISEASE (ICD-555.9) SMALL BOWEL RESTLESS LEG SYNDROME RENAL INSUFFICIENCY (STAGE 3) INTESTINAL HYPEROXALURIA HYPERTENSION NEPHROLITHIASIS 1. Ascending aortic aneurysm.     a.     Moderate aortic root dilatation, see details above.     b.     Dilatation at the sinus of Valsalva at 4.9 cm.     c.     Preserved left ventricular function.  No wall motion      abnormalities. 2. History of bilateral pulmonary emboli. 3. Crohn disease. 4. Renal insufficiency followed by Dr. Jimmy Footman.  Past Surgical History: Last updated: 12/27/2008 hernia repair cholecystectomy terminal ileum resection right hemi-colectomy and small bowel resectin left knee arthroscopic surgery left knee/LE ligament repair rigjht foot and ankle surgereis (gout)  Family History: Last updated: 02/27/2009 Family History of Kidney Disease: Father No FH of Colon Cancer:  Social History: Last updated: 12/27/2008 Alcohol Use - no Illicit Drug Use - no Patient is a former smoker.  Daily Caffeine Use  Patient does not get regular exercise.  Married [place] Veteran retired  Risk Factors: Exercise: no (05/09/2008)  Risk Factors: Smoking Status: quit (12/12/2009)  Review of Systems       The patient complains of fatigue.  The patient denies malaise, fever, weight gain/loss, vision loss, decreased hearing, hoarseness, chest  pain, palpitations, shortness of breath, prolonged cough, wheezing, sleep apnea, coughing up blood, abdominal pain, blood in stool, nausea, vomiting, diarrhea, heartburn, incontinence, blood in urine, muscle weakness, joint pain, leg swelling, rash, skin lesions, headache, fainting, dizziness, depression, anxiety, enlarged lymph nodes, easy bruising or bleeding, and environmental allergies.    Vital Signs:  Patient profile:   70 year old male Height:      72 inches Weight:      200 pounds Pulse rate:   65 / minute BP sitting:   107 / 73  (left arm) Cuff size:   large  Vitals Entered By: Georgina Peer (Dec 12, 2009 10:38 AM) CC: follow-up visit   Physical Exam  Additional Exam:  General: Well-developed, well-nourished in no distress head: Normocephalic and atraumatic eyes PERRLA/EOMI intact, conjunctiva and lids normal nose: No deformity or lesions mouth normal dentition, normal posterior pharynx neck: Supple, no JVD.  No masses, thyromegaly or abnormal cervical nodes lungs: Normal breath sounds bilaterally without wheezing.  Normal percussion heart: regular rate and rhythm with normal S1 and S2, no S3 or S4.  PMI is normal.  No pathological murmurs abdomen: Normal bowel sounds, abdomen is soft and nontender without masses, organomegaly or hernias noted.  No hepatosplenomegaly musculoskeletal: Back normal, normal gait muscle strength and tone normal pulsus: Pulse is normal in all 4 extremities Extremities: No peripheral pitting edema neurologic: Alert and oriented x 3 skin: Intact without lesions or rashes cervical nodes: No significant adenopathy psychologic: Normal affect    Impression & Recommendations:  Problem # 1:  ANEURYSM, THORACIC AORTIC (ICD-441.2) A. ascending aortic aneurysm measuring 4.1 cm. We'll continue to follow. Next study will be an echocardiographic study.  Problem # 2:  CROHN'S DISEASE (ICD-555.9) Assessment: Comment Only  Problem # 3:  CHEST PAIN  UNSPECIFIED (ICD-786.50) the patient has no recurrent chest pain. No ischemia workup is indicated. His updated medication list for this problem includes:    Sm Aspirin Low Dose 81 Mg Tbec (Aspirin) .Marland Kitchen... 1 tablet once daily    Atenolol 25 Mg Tabs (Atenolol) .Marland Kitchen... 1 tablet once daily  Patient Instructions: 1)  Your physician recommends that you continue on your current medications as directed. Please refer to the Current Medication list given to you today. 2)  Echo in 6 months just before next visit. 3)  Follow up in  6 months

## 2010-09-04 NOTE — Letter (Signed)
Summary: [facility] Patient Note   [facility] Patient Note   Imported By: Sallee Provencal 04/22/2010 15:58:39  _____________________________________________________________________  External Attachment:    Type:   Image     Comment:   External Document

## 2010-09-04 NOTE — Progress Notes (Signed)
Summary: and pain  Phone Note Call from Patient Call back at Home Phone [phone]   Caller: wife, [name] Call For: Dr. [name] Reason for Call: Talk to Nurse Summary of Call: pt experiencing a "stinging" sensation lower right abd Initial call taken by: Lucien Mons,  May 15, 2010 2:18 PM  Follow-up for Phone Call        Pts wife called to report patient has had a "stinging sensation in his surgical site off and on for 2 weeks." Patient  has had a respiratory infection with fever and cough that he saw his primary for on Monday. He is on a Z-pack and received Depomedrol IM.  no change in bowel habits. "Diarrhea no more than his usual." Denies nausea, vomiting, bloating, cramping or rectal bleeding. Dr. [name] please advise. Leone Payor, RN Follow-up by: Barb Merino RN, [place],  May 15, 2010 3:52 PM  Additional Follow-up for Phone Call Additional follow up Details #1::        Please give him Medrol pack , #1, follow instructions on the packet. I think his pain is from Crohn's disease. Additional Follow-up by: Lafayette Dragon MD,  May 17, 2010 10:15 PM    Additional Follow-up for Phone Call Additional follow up Details #2::    Patient  states "it feels like I am tearing inside." Patient  states he does not like to "take steriods because they make me irritable." Explained to patient the need to try this and that it is a tapering dose of steriods. Patient  states he will try taking them as orders. Rx sent to [facility]. Leone Payor, RN Follow-up by: Barb Merino RN, CGRN,  May 19, 2010 10:04 AM  New/Updated Medications: MEDROL (PAK) 4 MG TABS (METHYLPREDNISOLONE) Take as directed Prescriptions: MEDROL (PAK) 4 MG TABS (METHYLPREDNISOLONE) Take as directed  #1 pack x 0   Entered by:   Barb Merino RN, CGRN   Authorized by:   Lafayette Dragon MD   Signed by:   Barb Merino RN, CGRN on 05/19/2010   Method used:   Electronically to        [facility] (retail)       [address]       [place], [place]  [address]       Ph: [phone] or [phone]       Fax: [phone]   RxID:   [id number]

## 2010-09-04 NOTE — Letter (Signed)
Summary: Lexiscan or Dobutamine Adult nurse at [facility]. [address]   [place], [place] [address]   Phone: [phone]  Fax: [phone]      [place] or Dobutamine Cardiolite Strss Test    [facility]  Appointment Date:_  Appointment Time:_  Your doctor has ordered a CARDIOLITE STRESS TEST using a medication to stimulate exercise so that you will not have to walk on the treadmill to determine the condition of your heart during stress. If you take blood pressure medication, ask your doctor if you should take it the day of your test. You should not have anything to eat or drink at least 4 hours before your test is scheduled, and no caffeine, including decaffeinated tea and coffee, chocolate, and soft drinks for 24 hours before your test.  You will need to register at the Outpatient/Main Entrance at the hospital 15 minutes before your appointment time. It is a good idea to bring a copy of your order with you. They will direct you to the Diagnostic Imaging (Radiology) Department.  You will be asked to undress from the waist up and given a hospital gown to wear, so dress comfortably from the waist down for example: Sweat pants, shorts, or skirt Rubber soled lace up shoes (tennis shoes)  Plan on about three hours from registration to release from the hospital

## 2010-09-04 NOTE — Progress Notes (Signed)
Summary: Office Visit  Office Visit   Imported By: Delfino Lovett 12/12/2009 10:05:44  _____________________________________________________________________  External Attachment:    Type:   Image     Comment:   External Document

## 2010-09-04 NOTE — Miscellaneous (Signed)
Summary: Orders Update  Clinical Lists Changes  Orders: Added new Referral order of 2-D Echocardiogram (2D Echo) - Signed

## 2010-09-04 NOTE — Assessment & Plan Note (Signed)
Summary: 6 mof u per nov reminder   Visit Type:  Follow-up Referring Provider:  na Primary Provider:  [name], MD    History of Present Illness: patient has a history of ascending aortic aneurysm. Size has increased by recent echo from 4.1-4.86 cm. I recommended for the patient to undergo an MRI to measure the aorta accurately possible referral to vascular surgery for initial follow.the patient also has a history of dilatation of the sinuses of Valsalva although he has no clinical stigmata of Marfan's syndrome.creatinine is 1.69and the patient is followed by nephrology.we will discuss with radiology MRI with black blood imaging is adequate for measurement as the patient cannot get gadolinium. If this is not possible then we will perform a transesophageal echocardiogram for more accurate measurement. No scp. No sob. Worried about brother in Sports [place]. Will refer for MRI to measure aorta.  Lexiscan for sob and exposure to herbicides in [place].  Preventive Screening-Counseling & Management  Alcohol-Tobacco     Smoking Status: quit  Comments: quit smoking 30 yrs ago. Smoked for about 20 yrs  Current Medications (verified): 1)  Terazosin Hcl 2 Mg Caps (Terazosin Hcl) .... One Tablet By Mouth At Bedtime 2)  Tums 500 Mg Chew (Calcium Carbonate Antacid) .... Chew 2 Tabs Two Times A Day 3)  Effer-K 10 Meq Tbef (Potassium Bicarb-Citric Acid) .... 2 Tablet Two Times A Day 4)  Sm Aspirin Low Dose 81 Mg Tbec (Aspirin) .Marland Kitchen.. 1 Tablet Once Daily 5)  Trazodone Hcl 150 Mg Tabs (Trazodone Hcl) .Marland Kitchen.. 1 Tablet At Bedtime 6)  Atenolol 25 Mg Tabs (Atenolol) .Marland Kitchen.. 1 Tablet Once Daily 7)  Gabapentin 100 Mg Caps (Gabapentin) .Marland Kitchen.. 1 Tablet Two Times A Day 8)  Paxil 40 Mg Tabs (Paroxetine Hcl) .Marland Kitchen.. 1 Tablet At Bedtime 9)  Omeprazole 20 Mg Tbec (Omeprazole) .Marland Kitchen.. 1 Tablet Once Daily 10)  Magnesium Oxide 420 Mg Tabs (Magnesium Oxide) .... Take 1 Tablet By Mouth Two Times A Day 11)  Fish Oil   Oil (Fish Oil) .Marland Kitchen.. 1  Capsule Two Times A Day 12)  Multivitamins   Tabs (Multiple Vitamin) .Marland Kitchen.. 1 Tablet Once Daily 13)  Cyanocobalamin 1000 Mcg/ml Soln (Cyanocobalamin) .... Twice Per Month 14)  Colcrys 0.6 Mg Tabs (Colchicine) .... Take 1 Tablet By Mouth Once A Day 15)  Uloric 80 Mg Tabs (Febuxostat) .... One Tablet By Mouth Once Daily 16)  Medrol (Pak) 4 Mg Tabs (Methylprednisolone) .... Take As Directed  Allergies: 1)  ! Ativan  Comments:  Nurse/Medical Assistant: Pt could not find his medication list. He will call the office this afternoon with an updated list.  Gurney Maxin, RN, BSN (June 25, 2010 10:12 AM)  Past History:  Past Medical History: HIATAL HERNIA (ICD-553.3) GERD (ICD-530.81) GOUT(274.9) PULMONARY EMBOLISM, HX OF (ICD-V12.51) GASTRITIS, HX OF (ICD-V12.79) INTERNAL HEMORRHOIDS (ICD-455.0) POST TRAUMATIC STRESS SYNDROME (ICD-309.81) ABDOMINAL AORTIC ANEURYSM (ICD-441.4) HEPATITIS C, HX OF (ICD-V12.09) B12 DEFICIENCY (ICD-266.2) SMALL BOWEL OBSTRUCTION, HX OF (ICD-V12.79) Hx of PANCYTOPENIA (ICD-284.1) (ALLOPURINOL + 6MP) CROHN'S DISEASE (ICD-555.9) SMALL BOWEL RESTLESS LEG SYNDROME RENAL INSUFFICIENCY (STAGE 3) INTESTINAL HYPEROXALURIA HYPERTENSION NEPHROLITHIASIS 1. Ascending aortic aneurysm.     a.     Moderate aortic root dilatation, see details above.     b.     Dilatation at the sinus of Valsalva at 4.9 cm.     c.     Preserved left ventricular function.  No wall motion      abnormalities. 2. History of bilateral pulmonary emboli. 3. Crohn  disease. 4. Renal insufficiency followed by Dr. Jimmy Footman.creatinine 1.69. echocardiogram October 2011 descending aorta 4.86 cm.  Review of Systems       The patient complains of fatigue, shortness of breath, and abdominal pain.  The patient denies malaise, fever, weight gain/loss, vision loss, decreased hearing, hoarseness, chest pain, palpitations, prolonged cough, wheezing, sleep apnea, coughing up blood, blood in stool,  nausea, vomiting, diarrhea, heartburn, incontinence, blood in urine, muscle weakness, joint pain, leg swelling, rash, skin lesions, headache, fainting, dizziness, depression, anxiety, enlarged lymph nodes, easy bruising or bleeding, and environmental allergies.    Vital Signs:  Patient profile:   70 year old male Height:      72 inches Weight:      201.75 pounds Pulse rate:   54 / minute BP sitting:   131 / 84  (left arm) Cuff size:   regular  Vitals Entered By: Gurney Maxin, RN, BSN (June 25, 2010 10:05 AM) Comments Follow up office visit. No cardiac complaints   Physical Exam  Additional Exam:  General: Well-developed, well-nourished in no distress head: Normocephalic and atraumatic eyes PERRLA/EOMI intact, conjunctiva and lids normal nose: No deformity or lesions mouth normal dentition, normal posterior pharynx neck: Supple, no JVD.  No masses, thyromegaly or abnormal cervical nodes lungs: Normal breath sounds bilaterally without wheezing.  Normal percussion heart: regular rate and rhythm with normal S1 and S2, no S3 or S4.  PMI is normal.  No pathological murmurs abdomen: Normal bowel sounds, abdomen is soft and nontender without masses, organomegaly or hernias noted.  No hepatosplenomegaly musculoskeletal: Back normal, normal gait muscle strength and tone normal pulsus: Pulse is normal in all 4 extremities Extremities: No peripheral pitting edema neurologic: Alert and oriented x 3 skin: Intact without lesions or rashes cervical nodes: No significant adenopathy psychologic: Normal affect    Impression & Recommendations:  Problem # 1:  ANEURYSM, THORACIC AORTIC (ICD-441.2) there is a slight echocardiogram that the patient's descending aortic aneurysm has enlarged rapidly and 4.1 cm to 4.86 cm. We will obtain an MRI with white blood imaging for more accurate measurement. If indeed the patient's aorta is approaching 5 cm we will refer him to vascular surgery. He will  also be done more close followup. Orders: MRI (MRI)  Problem # 2:  CROHN'S DISEASE (ICD-555.9) Assessment: Comment Only  Problem # 3:  CORONARY ATHEROSCLEROSIS NATIVE CORONARY ARTERY (ICD-414.01) the patient reports some increasing shortness of breath. He has also been exposed herbicides in the [place] war which are risk factors for ischemic heart disease. The patient also has known peripheral vascular disease and will therefore order a Lexiscan to rule out ischemic heart disease. His updated medication list for this problem includes:    Sm Aspirin Low Dose 81 Mg Tbec (Aspirin) .Marland Kitchen... 1 tablet once daily    Atenolol 25 Mg Tabs (Atenolol) .Marland Kitchen... 1 tablet once daily  Orders: Nuclear Med ([place])  Other Orders: EKG w/ Interpretation (93[address])  Patient Instructions: 1)  MRI of chest  2)  Lexiscan stress test 3)  Follow up in  6 months  Appended Document: 6 mof u per nov reminder Positive cardiolite, Would explain to patient he has CAD. he questioned this because of exposure to herbicides in [place].In the absence of SCP and elevated creatine , I would not do cath, but follow for now. monitor for SCP. Would bring him back in next 2-3 months to discuss because he is eligble for financial reimbursement from [facility] for his exposure and CAD.  Appended Document: 6 mof u per nov reminder Patient notified.

## 2010-09-04 NOTE — Assessment & Plan Note (Signed)
Summary: f/u--ch.   History of Present Illness Visit Type: Follow-up Visit Primary GI MD: [name] MD Primary Provider: [name], MD  Requesting Provider: n/a Chief Complaint: F/u for Crohn's.  Pt denies any GI complaints  History of Present Illness:   This is a 70 year old white male with Crohn disease of the terminal ileum for which he is status post small bowel obstruction in 2010. He is currently doing very well. He is status post remote ileocolic anastomosis and was on Humira 40 mg every 2 weeks when he developed pancreatitis. He has been off  Humira for a year and has done well. He was on 6 MP but this was discontinued at the request of his nephrologist due to interference with the control of his uric acid medications. He has a history of hepatitis C but his viral RNA load is negative by PCR. His last colonoscopy in October 2009 showed multiple aphthous ulcerations at the anastomosis. A CT Scan of the abdomen in January 2010 at the time of his small bowel obstruction was consistent with obstruction.   GI Review of Systems      Denies abdominal pain, acid reflux, belching, bloating, chest pain, dysphagia with liquids, dysphagia with solids, heartburn, loss of appetite, nausea, vomiting, vomiting blood, weight loss, and  weight gain.        Denies anal fissure, black tarry stools, change in bowel habit, constipation, diarrhea, diverticulosis, fecal incontinence, heme positive stool, hemorrhoids, irritable bowel syndrome, jaundice, light color stool, liver problems, rectal bleeding, and  rectal pain.    Current Medications (verified): 1)  Terazosin Hcl 2 Mg Caps (Terazosin Hcl) .... One Tablet By Mouth At Bedtime 2)  Tums 500 Mg Chew (Calcium Carbonate Antacid) .... Chew 2 Tabs Two Times A Day 3)  Effer-K 10 Meq Tbef (Potassium Bicarb-Citric Acid) .... 2 Tablet Two Times A Day 4)  Sm Aspirin Low Dose 81 Mg Tbec (Aspirin) .Marland Kitchen.. 1 Tablet Once Daily 5)  Trazodone Hcl 150 Mg Tabs  (Trazodone Hcl) .Marland Kitchen.. 1 Tablet At Bedtime 6)  Atenolol 25 Mg Tabs (Atenolol) .Marland Kitchen.. 1 Tablet Once Daily 7)  Gabapentin 100 Mg Caps (Gabapentin) .Marland Kitchen.. 1 Tablet Two Times A Day 8)  Paxil 40 Mg Tabs (Paroxetine Hcl) .Marland Kitchen.. 1 Tablet At Bedtime 9)  Omeprazole 20 Mg Tbec (Omeprazole) .Marland Kitchen.. 1 Tablet Once Daily 10)  Magnesium Oxide 420 Mg Tabs (Magnesium Oxide) .... Take 1 Tablet By Mouth Two Times A Day 11)  Fish Oil   Oil (Fish Oil) .Marland Kitchen.. 1 Capsule Two Times A Day 12)  Multivitamins   Tabs (Multiple Vitamin) .Marland Kitchen.. 1 Tablet Once Daily 13)  Cyanocobalamin 1000 Mcg/ml Soln (Cyanocobalamin) .... Twice Per Month 14)  Colcrys 0.6 Mg Tabs (Colchicine) .... Take 1 Tablet By Mouth Once A Day 15)  Uloric 80 Mg Tabs (Febuxostat) .... One Tablet By Mouth Once Daily  Allergies (verified): 1)  ! Ativan  Past History:  Past Medical History: Reviewed history from 06/26/2009 and no changes required. HIATAL HERNIA (ICD-553.3) GERD (ICD-530.81) GOUT(274.9) PULMONARY EMBOLISM, HX OF (ICD-V12.51) GASTRITIS, HX OF (ICD-V12.79) INTERNAL HEMORRHOIDS (ICD-455.0) POST TRAUMATIC STRESS SYNDROME (ICD-309.81) ABDOMINAL AORTIC ANEURYSM (ICD-441.4) HEPATITIS C, HX OF (ICD-V12.09) B12 DEFICIENCY (ICD-266.2) SMALL BOWEL OBSTRUCTION, HX OF (ICD-V12.79) Hx of PANCYTOPENIA (ICD-284.1) (ALLOPURINOL + 6MP) CROHN'S DISEASE (ICD-555.9) SMALL BOWEL RESTLESS LEG SYNDROME RENAL INSUFFICIENCY (STAGE 3) INTESTINAL HYPEROXALURIA HYPERTENSION NEPHROLITHIASIS 1. Ascending aortic aneurysm.     a.     Moderate aortic root dilatation, see details above.  b.     Dilatation at the sinus of Valsalva at 4.9 cm.     c.     Preserved left ventricular function.  No wall motion      abnormalities. 2. History of bilateral pulmonary emboli. 3. Crohn disease. 4. Renal insufficiency followed by Dr. Jimmy Footman.  Past Surgical History: Reviewed history from 12/27/2008 and no changes required. hernia repair cholecystectomy terminal ileum  resection right hemi-colectomy and small bowel resectin left knee arthroscopic surgery left knee/LE ligament repair rigjht foot and ankle surgereis (gout)  Family History: Reviewed history from 02/27/2009 and no changes required. Family History of Kidney Disease: Father No FH of Colon Cancer:  Social History: Reviewed history from 12/27/2008 and no changes required. Alcohol Use - no Illicit Drug Use - no Patient is a former smoker.  Daily Caffeine Use Patient does not get regular exercise.  Married [place] Veteran retired  Review of Systems  The patient denies allergy/sinus, anemia, anxiety-new, arthritis/joint pain, back pain, blood in urine, breast changes/lumps, change in vision, confusion, cough, coughing up blood, depression-new, fainting, fatigue, fever, headaches-new, hearing problems, heart murmur, heart rhythm changes, itching, menstrual pain, muscle pains/cramps, night sweats, nosebleeds, pregnancy symptoms, shortness of breath, skin rash, sleeping problems, sore throat, swelling of feet/legs, swollen lymph glands, thirst - excessive , urination - excessive , urination changes/pain, urine leakage, vision changes, and voice change.         Pertinent positive and negative review of systems were noted in the above HPI. All other ROS was otherwise negative.   Vital Signs:  Patient profile:   70 year old male Height:      72 inches Weight:      196 pounds BMI:     26.68 BSA:     2.11 Pulse rate:   72 / minute Pulse rhythm:   regular BP sitting:   126 / 68  (left arm) Cuff size:   regular  Vitals Entered By: Hope Pigeon CMA (April 17, 2010 2:10 PM)  Physical Exam  General:  Well developed, well nourished, no acute distress. Eyes:  PERRLA, no icterus. Mouth:  No deformity or lesions, dentition normal. Neck:  Supple; no masses or thyromegaly. Lungs:  Clear throughout to auscultation. Heart:  Regular rate and rhythm; no murmurs, rubs,  or bruits. Abdomen:   multiple scars. Soft nontender with no palpable mass or tenderness. Msk:  Symmetrical with no gross deformities. Normal posture. Skin:  Intact without significant lesions or rashes. Psych:  Alert and cooperative. Normal mood and affect.   Impression & Recommendations:  Problem # 1:  CROHN'S DISEASE (ICD-555.9) Patient has stable Crohn's disease at the ileocolonic anastomosis. He has a hisotry of Humira-induced pancreatitis. Patient is unable to take 6-MP due to interference with his gout medications. He was on Entocort but this was discontinued. He will continue to be off medications and I'll see him in one year. A recall colonoscopy will be due in 5 years.  Problem # 2:  PANCREATITIS, ACUTE (ICD-577.0) due to Humira, not a pproblem  Patient Instructions: 1)  Patient will be due for a recall colonoscopy in October 2014. 2)  Office visit one year. 3)  Continue Vitamin B12 1,000 mcg monthly. 4)  Copy sent to : Dr J.Golding, Dr Deterding 5)  The medication list was reviewed and reconciled.  All changed / newly prescribed medications were explained.  A complete medication list was provided to the patient / caregiver.

## 2010-09-04 NOTE — Assessment & Plan Note (Signed)
Summary: PT. WANTS TO DISCUSS HUMIRA             Derek [name]   History of Present Illness Visit Type: Follow-up Visit Primary GI MD: [name] MD Primary Provider: Bonne Dolores, MD Requesting Provider: n/a Chief Complaint: F/u Crohns disease. Pt denies any GI sx at this time but wants to discuss Humira. History of Present Illness:   This is a 70 year old white male with Crohn's disease at the ileocolic anastomosis. His last office visit was in November 2010. He was on Humira until he developed acute pancreatitis in March 2010 which was attributed to the Humira. He has since been off the Humira and is doing well. He is not on any specific Crohn's disease treatment at this time. 6-MP was discontinued because of his use of Allopurinol and Uloric for gout. He has a history of hepatitis C but his liver function tests have been normal and the hepatitis C RNA by PCR has been negative.   GI Review of Systems      Denies abdominal pain, acid reflux, belching, bloating, chest pain, dysphagia with liquids, dysphagia with solids, heartburn, loss of appetite, nausea, vomiting, vomiting blood, weight loss, and  weight gain.        Denies anal fissure, black tarry stools, change in bowel habit, constipation, diarrhea, diverticulosis, fecal incontinence, heme positive stool, hemorrhoids, irritable bowel syndrome, jaundice, light color stool, liver problems, rectal bleeding, and  rectal pain.    Current Medications (verified): 1)  Terazosin Hcl 2 Mg Caps (Terazosin Hcl) .... One Tablet By Mouth At Bedtime 2)  Tums 500 Mg Chew (Calcium Carbonate Antacid) .... Chew 2 Tabs Two Times A Day 3)  Effer-K 10 Meq Tbef (Potassium Bicarb-Citric Acid) .Marland Kitchen.. 1 Tablet Two Times A Day 4)  Sm Aspirin Low Dose 81 Mg Tbec (Aspirin) .Marland Kitchen.. 1 Tablet Once Daily 5)  Trazodone Hcl 150 Mg Tabs (Trazodone Hcl) .Marland Kitchen.. 1 Tablet At Bedtime 6)  Atenolol 25 Mg Tabs (Atenolol) .Marland Kitchen.. 1 Tablet Once Daily 7)  Gabapentin 100 Mg Caps (Gabapentin)  .Marland Kitchen.. 1 Tablet Two Times A Day 8)  Paxil 40 Mg Tabs (Paroxetine Hcl) .Marland Kitchen.. 1 Tablet At Bedtime 9)  Omeprazole 20 Mg Tbec (Omeprazole) .Marland Kitchen.. 1 Tablet Once Daily 10)  Mag-Ox 400 400 Mg Tabs (Magnesium Oxide) .Marland Kitchen.. 1 Tablet Two Times A Day 11)  Fish Oil   Oil (Fish Oil) .Marland Kitchen.. 1 Capsule Two Times A Day 12)  Multivitamins   Tabs (Multiple Vitamin) .Marland Kitchen.. 1 Tablet Once Daily 13)  Cyanocobalamin 1000 Mcg/ml Soln (Cyanocobalamin) .... Twice Per Month 14)  Colchicine 0.6 Mg Tabs (Colchicine) .... Take 1 Tablet By Mouth Once A Day 15)  Uloric 80 Mg Tabs (Febuxostat) .... One Tablet By Mouth Once Daily  Allergies (verified): 1)  ! Ativan  Past History:  Past Medical History: Reviewed history from 06/26/2009 and no changes required. HIATAL HERNIA (ICD-553.3) GERD (ICD-530.81) GOUT(274.9) PULMONARY EMBOLISM, HX OF (ICD-V12.51) GASTRITIS, HX OF (ICD-V12.79) INTERNAL HEMORRHOIDS (ICD-455.0) POST TRAUMATIC STRESS SYNDROME (ICD-309.81) ABDOMINAL AORTIC ANEURYSM (ICD-441.4) HEPATITIS C, HX OF (ICD-V12.09) B12 DEFICIENCY (ICD-266.2) SMALL BOWEL OBSTRUCTION, HX OF (ICD-V12.79) Hx of PANCYTOPENIA (ICD-284.1) (ALLOPURINOL + 6MP) CROHN'S DISEASE (ICD-555.9) SMALL BOWEL RESTLESS LEG SYNDROME RENAL INSUFFICIENCY (STAGE 3) INTESTINAL HYPEROXALURIA HYPERTENSION NEPHROLITHIASIS 1. Ascending aortic aneurysm.     a.     Moderate aortic root dilatation, see details above.     b.     Dilatation at the sinus of Valsalva at 4.9 cm.     c.  Preserved left ventricular function.  No wall motion      abnormalities. 2. History of bilateral pulmonary emboli. 3. Crohn disease. 4. Renal insufficiency followed by Dr. Jimmy Footman.  Past Surgical History: Reviewed history from 12/27/2008 and no changes required. hernia repair cholecystectomy terminal ileum resection right hemi-colectomy and small bowel resectin left knee arthroscopic surgery left knee/LE ligament repair rigjht foot and ankle surgereis  (gout)  Family History: Reviewed history from 02/27/2009 and no changes required. Family History of Kidney Disease: Father No FH of Colon Cancer:  Social History: Reviewed history from 12/27/2008 and no changes required. Alcohol Use - no Illicit Drug Use - no Patient is a former smoker.  Daily Caffeine Use Patient does not get regular exercise.  Married [place] Veteran retired  Review of Systems  The patient denies allergy/sinus, anemia, anxiety-new, arthritis/joint pain, back pain, blood in urine, breast changes/lumps, change in vision, confusion, cough, coughing up blood, depression-new, fainting, fatigue, fever, headaches-new, hearing problems, heart murmur, heart rhythm changes, itching, menstrual pain, muscle pains/cramps, night sweats, nosebleeds, pregnancy symptoms, shortness of breath, skin rash, sleeping problems, sore throat, swelling of feet/legs, swollen lymph glands, thirst - excessive , urination - excessive , urination changes/pain, urine leakage, vision changes, and voice change.         Pertinent positive and negative review of systems were noted in the above HPI. All other ROS was otherwise negative.   Vital Signs:  Patient profile:   70 year old male Height:      72 inches Weight:      207.38 pounds BMI:     28.23 Pulse rate:   60 / minute Pulse rhythm:   regular BP sitting:   108 / 58  (left arm) Cuff size:   regular  Vitals Entered By: Marlon Pel CMA Deborra Medina) (September 13, 2009 1:38 PM)  Physical Exam  General:  Well developed, well nourished, no acute distress. Ears:  Normal auditory acuity. Mouth:  No deformity or lesions, dentition normal. Neck:  Supple; no masses or thyromegaly. Lungs:  Clear throughout to auscultation. Heart:  Regular rate and rhythm; no murmurs, rubs,  or bruits. Abdomen:  well-healed surgical scar in the right lower quadrant. Normoactive bowel sounds. No distention. No tenderness. Extremities:  No clubbing, cyanosis, edema  or deformities noted. Skin:  Intact without significant lesions or rashes. Psych:  Alert and cooperative. Normal mood and affect.   Impression & Recommendations:  Problem # 1:  CROHN'S DISEASE (ICD-555.9) Patient has Crohn's disease at the ileocolic anastomoses as per colonoscopy within the last 2 years. Patient developed pancreatitis after using Humira. He is unable to take immunomodulators because he is on a uric acid lowering agent  which interfers with the immunomodulators.  Problem # 2:  ANEURYSM, THORACIC AORTIC (ICD-441.2) Patient's last CT scan of the abdomen in March 2010 showed stable dilatation of the thoracic aorta at 4.1 cm.  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  Patient Instructions: 1)  Return in 6 months.  2)  Patient is to call us if he develops diarrhea or abdominal pain. At this time we are not planning to reinstitute Humira. 3)  The medication list was reviewed and reconciled.  All changed / newly prescribed medications were explained.  A complete medication list was provided to the patient / caregiver. 4)  Copy sent to : Dr Jimmy Footman,

## 2010-09-16 ENCOUNTER — Encounter: Payer: Self-pay | Admitting: Cardiology

## 2010-09-17 ENCOUNTER — Ambulatory Visit (INDEPENDENT_AMBULATORY_CARE_PROVIDER_SITE_OTHER): Payer: Medicare Other | Admitting: Cardiology

## 2010-09-17 ENCOUNTER — Encounter: Payer: Self-pay | Admitting: Cardiology

## 2010-09-17 DIAGNOSIS — I2510 Atherosclerotic heart disease of native coronary artery without angina pectoris: Secondary | ICD-10-CM

## 2010-09-24 NOTE — Assessment & Plan Note (Addendum)
Summary: F2M--AG[facility]/SRS   Visit Type:  Follow-up Referring Provider:  na Primary Provider:  [name], MD    History of Present Illness: Laboratory work done by [facility] was 36 and normal.  The patient had a follow-up CT scan for thoracic aortic aneurysm.  Aneurysm is stable at 4.1-cm there was no change. Nuclear stress test was suggestive of coronary artery disease.  There was mild decrease in activity at the base of the inferolateral wall.  There was reversibility that was significant.  There was also mild hypokinesis at the base of the inferior wall.  Dr. Ron Parker felt that the study was consistent with mild to moderate ischemia at the base of the inferior wall.  Ejection fraction was normal at 54% the patient is followed by nephrology and has moderate renal insufficiency.  There is last office visit the patient was rather asymptomatic.  The Lexi scan was ordered because of shortness of breath and exposure to herbicides in [place]  the patient reports no chest pain or shortness of breath.  He does have some reflux in the evening.  He has a hiatal hernia.  At this point he does not appear to have unstable symptoms of chest pain abd I do not think that he needs a further workup for a cardiac catheterizationcoma although he does appear to have  some degree of coronary artery  disease  confirmed by myocardial perfusion study.   Preventive Screening-Counseling & Management  Alcohol-Tobacco     Smoking Status: quit     Year Quit: 1982  Current Medications (verified): 1)  Terazosin Hcl 2 Mg Caps (Terazosin Hcl) .... One Tablet By Mouth At Bedtime 2)  Tums 500 Mg Chew (Calcium Carbonate Antacid) .... Chew 2 Tabs Two Times A Day 3)  Effer-K 10 Meq Tbef (Potassium Bicarb-Citric Acid) .... 2 Tablet Two Times A Day 4)  Sm Aspirin Low Dose 81 Mg Tbec (Aspirin) .Marland Kitchen.. 1 Tablet Once Daily 5)  Trazodone Hcl 150 Mg Tabs (Trazodone Hcl) .Marland Kitchen.. 1 Tablet At Bedtime 6)  Atenolol 25 Mg Tabs (Atenolol)  .Marland Kitchen.. 1 Tablet Once Daily 7)  Gabapentin 100 Mg Caps (Gabapentin) .Marland Kitchen.. 1 Tablet Two Times A Day 8)  Paxil 40 Mg Tabs (Paroxetine Hcl) .Marland Kitchen.. 1 Tablet At Bedtime 9)  Omeprazole 20 Mg Tbec (Omeprazole) .Marland Kitchen.. 1 Tablet Once Daily 10)  Magnesium Oxide 420 Mg Tabs (Magnesium Oxide) .... Take 1 Tablet By Mouth Two Times A Day 11)  Fish Oil   Oil (Fish Oil) .Marland Kitchen.. 1 Capsule Two Times A Day 12)  Multivitamins   Tabs (Multiple Vitamin) .Marland Kitchen.. 1 Tablet Once Daily 13)  Cyanocobalamin 1000 Mcg/ml Soln (Cyanocobalamin) .... Twice Per Month 14)  Colcrys 0.6 Mg Tabs (Colchicine) .... Take 1 Tablet By Mouth Once A Day 15)  Uloric 80 Mg Tabs (Febuxostat) .... One Tablet By Mouth Once Daily 16)  Aspir-Low 81 Mg Tbec (Aspirin) .... Take 1 Tablet By Mouth Once A Day  Allergies (verified): 1)  ! Ativan  Comments:  Nurse/Medical Assistant: The patient's medications and allergies were reviewed with the patient and were updated in the Medication and Allergy Lists. Tammi Romine CMA (September 17, 2010 10:16 AM)  Past History:  Past Surgical History: Last updated: 12/27/2008 hernia repair cholecystectomy terminal ileum resection right hemi-colectomy and small bowel resectin left knee arthroscopic surgery left knee/LE ligament repair rigjht foot and ankle surgereis (gout)  Family History: Last updated: 02/27/2009 Family History of Kidney Disease: Father No FH of Colon Cancer:  Social  History: Last updated: 12/27/2008 Alcohol Use - no Illicit Drug Use - no Patient is a former smoker.  Daily Caffeine Use Patient does not get regular exercise.  Married [place] Veteran retired  Risk Factors: Exercise: no (05/09/2008)  Risk Factors: Smoking Status: quit (09/17/2010)  Past Medical History: HIATAL HERNIA (ICD-553.3) GERD (ICD-530.81) GOUT(274.9) PULMONARY EMBOLISM, HX OF (ICD-V12.51) GASTRITIS, HX OF (ICD-V12.79) INTERNAL HEMORRHOIDS (ICD-455.0) POST TRAUMATIC STRESS SYNDROME  (ICD-309.81) ABDOMINAL AORTIC ANEURYSM (ICD-441.4) HEPATITIS C, HX OF (ICD-V12.09) B12 DEFICIENCY (ICD-266.2) SMALL BOWEL OBSTRUCTION, HX OF (ICD-V12.79) Hx of PANCYTOPENIA (ICD-284.1) (ALLOPURINOL + 6MP) CROHN'S DISEASE (ICD-555.9) SMALL BOWEL RESTLESS LEG SYNDROME RENAL INSUFFICIENCY (STAGE 3) INTESTINAL HYPEROXALURIA HYPERTENSION NEPHROLITHIASIS 1. Ascending aortic aneurysm.     a.     Moderate aortic root dilatation, see details above.     b.     Dilatation at the sinus of Valsalva at 4.9 cm.     c.     Preserved left ventricular function.  No wall motion      abnormalities. 2. History of bilateral pulmonary emboli. 3. Crohn disease. 4. Renal insufficiency followed by Dr. Jimmy Footman.creatinine 1.69. echocardiogram October 2011 descending aorta 4.86 cm. MRI in January 2012 aneurysm stable at 4.1-cm//thoracic aortic aneurysm January 2012 nuclear perfusion study mild to moderate ischemia at the base of the inferior wall  asymptomatic.  Ejection fraction 54%.  Review of Systems  The patient denies fatigue, malaise, fever, weight gain/loss, vision loss, decreased hearing, hoarseness, chest pain, palpitations, shortness of breath, prolonged cough, wheezing, sleep apnea, coughing up blood, abdominal pain, blood in stool, nausea, vomiting, diarrhea, heartburn, incontinence, blood in urine, muscle weakness, joint pain, leg swelling, rash, skin lesions, headache, fainting, dizziness, depression, anxiety, enlarged lymph nodes, easy bruising or bleeding, and environmental allergies.         chest pain is atypical..   The patient has reflux symptoms.  He eats late at night.  There are no exertional symptoms.  Vital Signs:  Patient profile:   70 year old male Height:      72 inches Weight:      199 pounds BMI:     27.09 Pulse rate:   57 / minute BP sitting:   104 / 76  (left arm) Cuff size:   large  Vitals Entered By: Annett Gula CMA (September 17, 2010 10:16 AM)  Physical  Exam  Additional Exam:  General: Well-developed, well-nourished in no distress head: Normocephalic and atraumatic eyes PERRLA/EOMI intact, conjunctiva and lids normal nose: No deformity or lesions mouth normal dentition, normal posterior pharynx neck: Supple, no JVD.  No masses, thyromegaly or abnormal cervical nodes lungs: Normal breath sounds bilaterally without wheezing.  Normal percussion heart: regular rate and rhythm with normal S1 and S2, no S3 or S4.  PMI is normal.  No pathological murmurs abdomen: Normal bowel sounds, abdomen is soft and nontender without masses, organomegaly or hernias noted.  No hepatosplenomegaly musculoskeletal: Back normal, normal gait muscle strength and tone normal pulsus: Pulse is normal in all 4 extremities Extremities: No peripheral pitting edema neurologic: Alert and oriented x 3 skin: Intact without lesions or rashes cervical nodes: No significant adenopathy psychologic: Normal affect    Impression & Recommendations:  Problem # 1:  CORONARY ATHEROSCLEROSIS NATIVE CORONARY ARTERY (ICD-414.01) the patient is asymptomatic.  He does report atypical chest pain but this appears to be related to esophageal spasm.  He also has a history of hiatal hernia.   However his myocardial perfusion study does demonstrate that the patient has  underlying coronary artery disease  and needs further risk factor modification.  The patient reports to me that his cholesterol has been checked at the [place] in by his kidney doctors.  His total cholesterol however is only137..   I asked the patient to consider statin drug therapy after talking with his physicians.   I have also restarted him on aspirin 81 mm p.o. daily His upated medication list for this problem includes:    Sm Aspirin Low Dose 81 Mg Tbec (Aspirin) .Marland Kitchen... 1 tablet once daily    Atenolol 25 Mg Tabs (Atenolol) .Marland Kitchen... 1 tablet once daily    Aspir-low 81 Mg Tbec (Aspirin) .Marland Kitchen... Take 1 tablet by mouth once a day  Problem  # 2:  ANEURYSM, THORACIC AORTIC (ICD-441.2) the patient had a follow-up MRI to measure his aneurysm.  It is stable at 4.1-cm.  There is no change from a year ago.  There was an increased reading on the aortic root  based on the echocardiogram..  I explained to the patient that the MRI study this is a better measurement.  Problem # 3:  CROHN'S DISEASE (ICD-555.9) the patient's Crohn disease appears to be stable.  Problem # 4:  PULMONARY EMBOLISM, HX OF (ICD-V12.51) the patient has a history of pulmonaryembolism and DVT but has been off Coumadin and there is no recurrence. His updated medication list for this problem includes:    Sm Aspirin Low Dose 81 Mg Tbec (Aspirin) .Marland Kitchen... 1 tablet once daily    Aspir-low 81 Mg Tbec (Aspirin) .Marland Kitchen... Take 1 tablet by mouth once a day  Patient Instructions: 1)  Aspirin 36m daily 2)  Follow up in  6 months  Appended Document: F2M--AGH/SRS patient has evidence of coronary artery disease which could be related to prior chemical exposure [place] copy can be forwarded to the patient and can be used for his documentation

## 2010-09-25 ENCOUNTER — Telehealth (INDEPENDENT_AMBULATORY_CARE_PROVIDER_SITE_OTHER): Payer: Self-pay | Admitting: *Deleted

## 2010-09-26 ENCOUNTER — Telehealth: Payer: Self-pay | Admitting: Cardiology

## 2010-10-09 NOTE — Progress Notes (Signed)
Summary: CHECKING STATUS OF PAPERWORK  Phone Note Call from Patient Call back at Home Phone [phone]   Caller: Patient Call For: Nurse Summary of Call: message left on nurse voicemail r/e status of paperwork that was left for MD to fill out. Initial call taken by: Georgina Peer,  September 25, 2010 4:25 PM  Follow-up for Phone Call        I am not sure where paperwork is. Follow-up by: Terald Sleeper, MD, [facility],  September 28, 2010 2:18 PM

## 2010-10-09 NOTE — Progress Notes (Signed)
Summary: Disability  Phone Note Call from Patient Call back at Home Phone [phone]   Summary of Call: Mr. Caul call and said you were to write a letter to [facility] for disability for him. Do you want to do a addendum to his office visit on 2/15 or write a letter for him? Initial call taken by: Bartholomew Boards,  September 26, 2010 8:46 AM  Follow-up for Phone Call        will make an addendum to the office note Follow-up by: Terald Sleeper, MD, [facility],  September 28, 2010 2:23 PM  Additional Follow-up for Phone Call Additional follow up Details #1::        Called patient on 10/01/10 to come pick up office note for [facility]. If I don't hear from him I will mail a copy to his home.  Left message to return call. Bartholomew Boards  October 01, 2010 2:48 PM   Wife picked up 10/03/10 Additional Follow-up by: Bartholomew Boards,  October 03, 2010 2:53 PM

## 2010-11-11 LAB — COMPREHENSIVE METABOLIC PANEL
ALT: 47 U/L (ref 0–53)
ALT: 63 U/L — ABNORMAL HIGH (ref 0–53)
AST: 30 U/L (ref 0–37)
Albumin: 3.1 g/dL — ABNORMAL LOW (ref 3.5–5.2)
Alkaline Phosphatase: 119 U/L — ABNORMAL HIGH (ref 39–117)
Alkaline Phosphatase: 121 U/L — ABNORMAL HIGH (ref 39–117)
BUN: 18 mg/dL (ref 6–23)
CO2: 25 mEq/L (ref 19–32)
CO2: 25 mEq/L (ref 19–32)
Calcium: 8.5 mg/dL (ref 8.4–10.5)
Chloride: 105 mEq/L (ref 96–112)
Creatinine, Ser: 1.57 mg/dL — ABNORMAL HIGH (ref 0.4–1.5)
GFR calc Af Amer: 54 mL/min — ABNORMAL LOW (ref >60)
GFR calc non Af Amer: 44 mL/min — ABNORMAL LOW (ref >60)
Glucose, Bld: 178 mg/dL — ABNORMAL HIGH (ref 70–99)
Glucose, Bld: 98 mg/dL (ref 70–99)
Potassium: 4.6 mEq/L (ref 3.5–5.1)
Potassium: 4.9 mEq/L (ref 3.5–5.1)
Sodium: 139 mEq/L (ref 135–145)
Sodium: 139 mEq/L (ref 135–145)
Total Bilirubin: 1.3 mg/dL — ABNORMAL HIGH (ref 0.3–1.2)
Total Protein: 5.8 g/dL — ABNORMAL LOW (ref 6.0–8.3)
Total Protein: 6 g/dL (ref 6.0–8.3)
Total Protein: 6.5 g/dL (ref 6.0–8.3)

## 2010-11-11 LAB — CBC
HCT: 43.1 % (ref 39.0–52.0)
Hemoglobin: 14.5 g/dL (ref 13.0–17.0)
MCHC: 33.6 g/dL (ref 30.0–36.0)
MCV: 93 fL (ref 78.0–100.0)
RBC: 4.47 MIL/uL (ref 4.22–5.81)
RBC: 4.64 MIL/uL (ref 4.22–5.81)
RDW: 14.3 % (ref 11.5–15.5)
RDW: 14.8 % (ref 11.5–15.5)
WBC: 7.5 10*3/uL (ref 4.0–10.5)

## 2010-11-11 LAB — LIPASE, BLOOD
Lipase: 32 U/L (ref 11–59)
Lipase: 695 U/L — ABNORMAL HIGH (ref 11–59)
Lipase: 77 U/L — ABNORMAL HIGH (ref 11–59)

## 2010-11-11 LAB — AMYLASE
Amylase: 110 U/L (ref 27–131)
Amylase: 709 U/L — ABNORMAL HIGH (ref 27–131)

## 2010-11-17 LAB — BASIC METABOLIC PANEL
CO2: 28 mEq/L (ref 19–32)
Calcium: 8.2 mg/dL — ABNORMAL LOW (ref 8.4–10.5)
Calcium: 8.4 mg/dL (ref 8.4–10.5)
Creatinine, Ser: 1.91 mg/dL — ABNORMAL HIGH (ref 0.4–1.5)
GFR calc Af Amer: 43 mL/min — ABNORMAL LOW (ref >60)
GFR calc Af Amer: 44 mL/min — ABNORMAL LOW (ref >60)
GFR calc non Af Amer: 36 mL/min — ABNORMAL LOW (ref >60)
GFR calc non Af Amer: 37 mL/min — ABNORMAL LOW (ref >60)
Glucose, Bld: 109 mg/dL — ABNORMAL HIGH (ref 70–99)
Potassium: 4.3 mEq/L (ref 3.5–5.1)
Sodium: 138 mEq/L (ref 135–145)
Sodium: 141 mEq/L (ref 135–145)

## 2010-11-17 LAB — CBC
HCT: 39 % (ref 39.0–52.0)
Hemoglobin: 13.2 g/dL (ref 13.0–17.0)
Hemoglobin: 15.4 g/dL (ref 13.0–17.0)
MCHC: 33.7 g/dL (ref 30.0–36.0)
RBC: 4.16 MIL/uL — ABNORMAL LOW (ref 4.22–5.81)
RBC: 4.26 MIL/uL (ref 4.22–5.81)
RBC: 4.86 MIL/uL (ref 4.22–5.81)
RDW: 14.4 % (ref 11.5–15.5)
WBC: 6.6 10*3/uL (ref 4.0–10.5)

## 2010-11-17 LAB — COMPREHENSIVE METABOLIC PANEL
ALT: 35 U/L (ref 0–53)
AST: 31 U/L (ref 0–37)
Alkaline Phosphatase: 118 U/L — ABNORMAL HIGH (ref 39–117)
CO2: 25 mEq/L (ref 19–32)
Calcium: 9.9 mg/dL (ref 8.4–10.5)
Chloride: 103 mEq/L (ref 96–112)
GFR calc Af Amer: 40 mL/min — ABNORMAL LOW (ref >60)
GFR calc non Af Amer: 33 mL/min — ABNORMAL LOW (ref >60)
Glucose, Bld: 107 mg/dL — ABNORMAL HIGH (ref 70–99)
Potassium: 4.5 mEq/L (ref 3.5–5.1)
Sodium: 140 mEq/L (ref 135–145)

## 2010-11-17 LAB — DIFFERENTIAL
Basophils Relative: 0 % (ref 0–1)
Eosinophils Absolute: 0 10*3/uL (ref 0.0–0.7)
Eosinophils Relative: 1 % (ref 0–5)
Lymphs Abs: 0.6 10*3/uL — ABNORMAL LOW (ref 0.7–4.0)
Neutrophils Relative %: 86 % — ABNORMAL HIGH (ref 43–77)

## 2010-12-08 ENCOUNTER — Inpatient Hospital Stay (HOSPITAL_COMMUNITY)
Admission: EM | Admit: 2010-12-08 | Discharge: 2010-12-12 | DRG: 386 | Disposition: A | Discharge status: Home / Self Care | Payer: Medicare Other | Attending: Internal Medicine | Admitting: Internal Medicine

## 2010-12-08 ENCOUNTER — Emergency Department (HOSPITAL_COMMUNITY): Payer: Medicare Other

## 2010-12-08 DIAGNOSIS — D696 Thrombocytopenia, unspecified: Secondary | ICD-10-CM | POA: Diagnosis present

## 2010-12-08 DIAGNOSIS — Z79899 Other long term (current) drug therapy: Secondary | ICD-10-CM

## 2010-12-08 DIAGNOSIS — I714 Abdominal aortic aneurysm, without rupture, unspecified: Secondary | ICD-10-CM | POA: Diagnosis present

## 2010-12-08 DIAGNOSIS — I129 Hypertensive chronic kidney disease with stage 1 through stage 4 chronic kidney disease, or unspecified chronic kidney disease: Secondary | ICD-10-CM | POA: Diagnosis present

## 2010-12-08 DIAGNOSIS — N189 Chronic kidney disease, unspecified: Secondary | ICD-10-CM | POA: Diagnosis present

## 2010-12-08 DIAGNOSIS — Z86718 Personal history of other venous thrombosis and embolism: Secondary | ICD-10-CM

## 2010-12-08 DIAGNOSIS — K565 Intestinal adhesions [bands], unspecified as to partial versus complete obstruction: Secondary | ICD-10-CM | POA: Diagnosis present

## 2010-12-08 DIAGNOSIS — G589 Mononeuropathy, unspecified: Secondary | ICD-10-CM | POA: Diagnosis present

## 2010-12-08 DIAGNOSIS — K5000 Crohn's disease of small intestine without complications: Principal | ICD-10-CM | POA: Diagnosis present

## 2010-12-08 DIAGNOSIS — Z87442 Personal history of urinary calculi: Secondary | ICD-10-CM

## 2010-12-08 DIAGNOSIS — Z9049 Acquired absence of other specified parts of digestive tract: Secondary | ICD-10-CM

## 2010-12-08 DIAGNOSIS — M109 Gout, unspecified: Secondary | ICD-10-CM | POA: Diagnosis present

## 2010-12-08 DIAGNOSIS — D72819 Decreased white blood cell count, unspecified: Secondary | ICD-10-CM | POA: Diagnosis present

## 2010-12-08 DIAGNOSIS — F4310 Post-traumatic stress disorder, unspecified: Secondary | ICD-10-CM | POA: Diagnosis present

## 2010-12-08 DIAGNOSIS — G2581 Restless legs syndrome: Secondary | ICD-10-CM | POA: Diagnosis present

## 2010-12-08 DIAGNOSIS — N400 Enlarged prostate without lower urinary tract symptoms: Secondary | ICD-10-CM | POA: Diagnosis present

## 2010-12-08 DIAGNOSIS — K219 Gastro-esophageal reflux disease without esophagitis: Secondary | ICD-10-CM | POA: Diagnosis present

## 2010-12-08 DIAGNOSIS — B1920 Unspecified viral hepatitis C without hepatic coma: Secondary | ICD-10-CM | POA: Diagnosis present

## 2010-12-08 LAB — COMPREHENSIVE METABOLIC PANEL
Alkaline Phosphatase: 138 U/L — ABNORMAL HIGH (ref 39–117)
BUN: 22 mg/dL (ref 6–23)
Glucose, Bld: 101 mg/dL — ABNORMAL HIGH (ref 70–99)
Potassium: 4 mEq/L (ref 3.5–5.1)
Total Bilirubin: 0.8 mg/dL (ref 0.3–1.2)
Total Protein: 6.8 g/dL (ref 6.0–8.3)

## 2010-12-08 LAB — DIFFERENTIAL
Eosinophils Relative: 1 % (ref 0–5)
Lymphocytes Relative: 17 % (ref 12–46)
Lymphs Abs: 1.1 10*3/uL (ref 0.7–4.0)
Monocytes Absolute: 0.3 10*3/uL (ref 0.1–1.0)
Monocytes Relative: 5 % (ref 3–12)

## 2010-12-08 LAB — POCT I-STAT, CHEM 8
BUN: 29 mg/dL — ABNORMAL HIGH (ref 6–23)
Chloride: 107 mEq/L (ref 96–112)
Creatinine, Ser: 2.1 mg/dL — ABNORMAL HIGH (ref 0.4–1.5)
Glucose, Bld: 102 mg/dL — ABNORMAL HIGH (ref 70–99)
HCT: 48 % (ref 39.0–52.0)
Potassium: 4 mEq/L (ref 3.5–5.1)

## 2010-12-08 LAB — CBC
HCT: 45.1 % (ref 39.0–52.0)
MCH: 29.2 pg (ref 26.0–34.0)
MCV: 83.8 fL (ref 78.0–100.0)
RDW: 13.9 % (ref 11.5–15.5)
WBC: 6.7 10*3/uL (ref 4.0–10.5)

## 2010-12-09 DIAGNOSIS — K56609 Unspecified intestinal obstruction, unspecified as to partial versus complete obstruction: Secondary | ICD-10-CM

## 2010-12-09 LAB — COMPREHENSIVE METABOLIC PANEL
AST: 33 U/L (ref 0–37)
Albumin: 3.6 g/dL (ref 3.5–5.2)
Alkaline Phosphatase: 131 U/L — ABNORMAL HIGH (ref 39–117)
Chloride: 107 mEq/L (ref 96–112)
GFR calc Af Amer: 48 mL/min — ABNORMAL LOW (ref >60)
Potassium: 3.7 mEq/L (ref 3.5–5.1)
Sodium: 139 mEq/L (ref 135–145)
Total Bilirubin: 0.7 mg/dL (ref 0.3–1.2)

## 2010-12-09 LAB — DIFFERENTIAL
Basophils Relative: 0 % (ref 0–1)
Eosinophils Absolute: 0 10*3/uL (ref 0.0–0.7)
Lymphs Abs: 0.3 10*3/uL — ABNORMAL LOW (ref 0.7–4.0)
Neutrophils Relative %: 91 % — ABNORMAL HIGH (ref 43–77)

## 2010-12-09 LAB — LACTIC ACID, PLASMA: Lactic Acid, Venous: 0.8 mmol/L (ref 0.5–2.2)

## 2010-12-09 LAB — CBC
Platelets: 80 10*3/uL — ABNORMAL LOW (ref 150–400)
RBC: 5.08 MIL/uL (ref 4.22–5.81)
WBC: 8 10*3/uL (ref 4.0–10.5)

## 2010-12-09 NOTE — Assessment & Plan Note (Signed)
[facility]                                ON-CALL NOTE  ROME, ECHAVARRIA                     MRN:          [id number] DATE:12/08/2010                            DOB:          1940/11/27   Mr. Dolinski called tonight, stated he has not had a bowel movement nor has he passed gas in the last 24 hours.  His abdomen is distended.  He has a history of Crohn's disease and has had obstruction in the past. He is without pain, nausea, and vomiting.  I instructed the patient to remain n.p.o. and to see how he does over the next 12 hours. He felt that he wanted to go to the emergency room.  Accordingly, I sent him to [facility] ER for evaluation.    Sandy Salaam. Deatra Ina, MD,FACG    RDK/MedQ  DD: 12/08/2010  DT: 12/09/2010  Job #: [id number]

## 2010-12-10 ENCOUNTER — Encounter: Payer: Self-pay | Admitting: Internal Medicine

## 2010-12-10 ENCOUNTER — Inpatient Hospital Stay (HOSPITAL_COMMUNITY): Payer: Medicare Other

## 2010-12-10 DIAGNOSIS — K56609 Unspecified intestinal obstruction, unspecified as to partial versus complete obstruction: Secondary | ICD-10-CM

## 2010-12-10 LAB — BASIC METABOLIC PANEL
Chloride: 104 mEq/L (ref 96–112)
GFR calc non Af Amer: 41 mL/min — ABNORMAL LOW (ref >60)
Glucose, Bld: 144 mg/dL — ABNORMAL HIGH (ref 70–99)
Potassium: 3.8 mEq/L (ref 3.5–5.1)
Sodium: 137 mEq/L (ref 135–145)

## 2010-12-10 LAB — CBC
HCT: 44.7 % (ref 39.0–52.0)
Platelets: 82 10*3/uL — ABNORMAL LOW (ref 150–400)
RBC: 5.23 MIL/uL (ref 4.22–5.81)
RDW: 14.1 % (ref 11.5–15.5)
WBC: 4.3 10*3/uL (ref 4.0–10.5)

## 2010-12-10 LAB — DIFFERENTIAL
Eosinophils Absolute: 0.1 10*3/uL (ref 0.0–0.7)
Lymphs Abs: 0.4 10*3/uL — ABNORMAL LOW (ref 0.7–4.0)
Monocytes Relative: 15 % — ABNORMAL HIGH (ref 3–12)
Neutro Abs: 3.2 10*3/uL (ref 1.7–7.7)

## 2010-12-10 LAB — MAGNESIUM: Magnesium: 1.7 mg/dL (ref 1.5–2.5)

## 2010-12-11 ENCOUNTER — Inpatient Hospital Stay (HOSPITAL_COMMUNITY): Payer: Medicare Other

## 2010-12-11 DIAGNOSIS — K56609 Unspecified intestinal obstruction, unspecified as to partial versus complete obstruction: Secondary | ICD-10-CM

## 2010-12-11 LAB — CBC
MCH: 28.6 pg (ref 26.0–34.0)
MCHC: 32.8 g/dL (ref 30.0–36.0)
MCV: 87.1 fL (ref 78.0–100.0)
Platelets: 71 10*3/uL — ABNORMAL LOW (ref 150–400)
RBC: 4.51 MIL/uL (ref 4.22–5.81)

## 2010-12-11 LAB — BASIC METABOLIC PANEL
BUN: 21 mg/dL (ref 6–23)
Calcium: 8.3 mg/dL — ABNORMAL LOW (ref 8.4–10.5)
Chloride: 106 mEq/L (ref 96–112)
Creatinine, Ser: 1.65 mg/dL — ABNORMAL HIGH (ref 0.4–1.5)

## 2010-12-12 LAB — DIFFERENTIAL
Basophils Absolute: 0 10*3/uL (ref 0.0–0.1)
Basophils Relative: 0 % (ref 0–1)
Eosinophils Relative: 4 % (ref 0–5)
Monocytes Absolute: 0.3 10*3/uL (ref 0.1–1.0)

## 2010-12-12 LAB — BASIC METABOLIC PANEL
Calcium: 7.9 mg/dL — ABNORMAL LOW (ref 8.4–10.5)
Creatinine, Ser: 1.55 mg/dL — ABNORMAL HIGH (ref 0.4–1.5)
GFR calc Af Amer: 54 mL/min — ABNORMAL LOW (ref >60)
GFR calc non Af Amer: 45 mL/min — ABNORMAL LOW (ref >60)
Glucose, Bld: 110 mg/dL — ABNORMAL HIGH (ref 70–99)
Sodium: 139 mEq/L (ref 135–145)

## 2010-12-12 LAB — CBC
HCT: 37.9 % — ABNORMAL LOW (ref 39.0–52.0)
MCH: 28.9 pg (ref 26.0–34.0)
MCHC: 33 g/dL (ref 30.0–36.0)
RDW: 14 % (ref 11.5–15.5)

## 2010-12-12 NOTE — Consult Note (Signed)
NAMEJESSELEE, POTH            ACCOUNT NO.:  [id number]  MEDICAL RECORD NO.:  [id number]           PATIENT TYPE:  I  LOCATION:  2841                         FACILITY:  [facility]  PHYSICIAN:  Fenton Malling. [name], M.D.  DATE OF BIRTH:  10-29-1940  DATE OF CONSULTATION:  12/09/2010                                CONSULTATION   TIME OF CONSULTATION:  12:08 p.m.  REQUESTING PHYSICIAN:  Vernell Leep, MD.  CONSULTING SURGEON:  Fenton Malling. [name], M.D.  PRIMARY GASTROENTEROLOGIST:  [name]. [name], MD.  PRIMARY CARDIOLOGIST:  Ernestine Mcmurray, MD,FACC.  PRIMARY DOCTOR:  Dr. Hilma Favors  REASON FOR CONSULTATION:  Small-bowel obstruction.  HISTORY OF PRESENT ILLNESS:  Mr. Risinger is a 70 year old white male with multiple medical problems, who has a history of Crohn disease with active ileitis per the Gastroenterology PA.  He had an ileocecectomy in 1978 with multiple complications.  The patient has had intermittent bowel obstructions in the past but no other surgeries have been required.  He has recently been on Humira; however, he developed pancreatitis and that was stopped.  He was then placed on 6-MP, but this caused kidney problems and therefore this was also stopped.  Therefore, he has been untreated for the last several months secondary to side effects from different medications.  He has had a colonoscopy within the last 2-3 years that showed active Crohn ileitis.  He otherwise also had a small-bowel follow-through in 2009, which was essentially negative.  Yesterday, the patient was at home and developed abdominal distention. He has not passed any flatus since yesterday and has not had a bowel movement since yesterday morning.  He has had some mild nausea but no emesis.  He complains of diffuse abdominal pain that is worse secondary to distention.  Because of the feeling that seemed consistent with prior bowel obstructions, he presented to the emergency department where he had an  abdominal x-ray which revealed small-bowel obstruction.  At this time, he was admitted and NG tube was placed for decompression.  We have been asked to evaluate the patient at this time for further recommendations.  REVIEW OF SYSTEMS:  Please see HPI.  Otherwise, all other systems have been reviewed and are negative.  PAST MEDICAL HISTORY: 1. Gout. 2. GERD. 3. History of pulmonary embolism. 4. History of DVTs. 5. Post-traumatic stress disorder. 6. Abdominal aortic aneurysm. 7. History of hepatitis C. 8. History of bowel obstructions. 9. History of pancreatitis. 10.Crohn disease. 11.Restless legs syndrome. 12.Renal insufficiency. 13.Hypertension. 14.Nephrolithiasis.  PAST SURGICAL HISTORY: 1. Laparotomy with ileocecostomy in 1978. 2. Cholecystectomy. 3. Multiple orthopedic surgeries.  FAMILY HISTORY:  Noncontributory.  SOCIAL HISTORY:  The patient is married.  He has 2 children.  He is retired.  He denies any alcohol, tobacco or illicit drug abuse.  ALLERGIES:  ATIVAN.  MEDICATIONS AT HOME:  Include: 1. Vicodin 10/325 p.r.n. 2. Sulfamethoxazole/trimethoprim 400/80 mg daily. 3. Uloric. 4. Tums. 5. Trazodone. 6. Terazosin. 7. Paxil. 8. Omeprazole. 9. Multivitamins. 10.Mag-Ox. 11.Gabapentin. 12.Fish oil. 13.Vitamin B12. 14.Colchicine. 15.Atenolol.  PHYSICAL EXAMINATION:  GENERAL:  Mr. Shiffler is a very pleasant, well- developed, well-nourished 70 year old white male who is currently  laying in bed in no acute distress. VITAL SIGNS:  Temperature 97.8, pulse 67, respirations 18, blood pressure 109/73. HEENT:  Head is normocephalic, atraumatic.  Sclerae noninjected.  Pupils are equal, round and reactive to light.  Ears and nose without any obvious masses or lesions.  No rhinorrhea.  However, he does have an NG tube in place with bilious output.  Mouth is pink.  Throat shows no exudate. HEART:  Has regular rate and rhythm.  Normal S1, S2.  No murmurs, gallops  or rubs are noted.  He does have palpable carotid, radial and pedal pulses bilaterally. LUNGS:  Clear to auscultation bilaterally with no wheezes, rhonchi or rales noted. RESPIRATORY:  Effort is nonlabored. ABDOMEN:  Soft, distended with absent to hypoactive bowel sounds.  He is diffusely tender with voluntary guarding.  He does have a paramedian laparotomy scar noted from his prior surgery.  Otherwise, no masses, hernias or organomegaly are noted.   MUSCULOSKELETAL:  All four extremities are symmetrical with no cyanosis, clubbing or edema. SKIN:  Warm and dry with no obvious masses, lesions or rashes. PSYCHIATRIC:  The patient is alert and oriented x3 with an appropriate affect.  LABORATORY DATA:  Sodium 139, potassium 3.7, glucose 121, BUN 21, creatinine 1.73.  LFTs are all essentially unremarkable except for an alkaline phosphatase level of 131, white blood cell count is 8000, hemoglobin 14.9, hematocrit 43.6, platelet count is 80,000 with a left shift and neutrophil count of 91%.  Lactic acid is 0.8.  Of note, his neutrophil count yesterday was 77 and is up to 91 today.  DIAGNOSTICS:  Abdominal x-ray reveals no active pulmonary disease with changes consistent with a small-bowel obstruction.  IMPRESSION: 1. Small-bowel obstruction secondary to Crohn disease versus adhesive     disease. 2. Abdominal aortic aneurysm followed by Dr. Dannielle Burn. 3. Chronic renal insufficiency. 4. Gout. 5. History of Crohn disease. 6. History of pulmonary embolism/deep venous thrombosis. 7. Pancytopenia related to allopurinol. 8. History of hepatitis C.  PLAN:  At this time, we agree with NG tube management for decompression and bowel rest.  We will repeat abdominal films in the morning.  If these films do not show much improvement, then the patient may need to get a CT scan of the abdomen and pelvis to further evaluate this obstruction.    We will repeat labs in the morning as well to follow  his white count to make sure this does not begin trending upwards.  Per Dr. Algis Liming, if the patient requires surgical intervention, we will need to have a cardiologist evaluate him for clearance based on his AAA as well as his coronary artery disease.  Dr. Arlina Robes most recent note from his office is in the chart and was stable at that time.  We will follow the patient closely along with you.  Thank you for this consultation.  Henreitta Cea, PA   Fenton Malling. [name], M.D., FACS  KEO/MEDQ  D:  12/09/2010  T:  12/09/2010  Job:  [id number]  cc:   Vernell Leep, MD  [name]. [name], [place] [id number]  [address], [place] [address] [place], [place] [address]  Electronically Signed by Saverio Danker PA on 12/12/2010 11:09:30 AM Electronically Signed by [name] M.D. on 12/12/2010 03:35:38 PM

## 2010-12-16 NOTE — Op Note (Signed)
[name], Derek [name]            ACCOUNT NO.:  [id number]   MEDICAL RECORD NO.:  [id number]          PATIENT TYPE:  AMB   LOCATION:  NESC                         FACILITY:  [facility]   PHYSICIAN:  Susa Day, M.D.    DATE OF BIRTH:  Nov 30, 1940   DATE OF PROCEDURE:  02/14/2007  DATE OF DISCHARGE:                               OPERATIVE REPORT   PREOPERATIVE DIAGNOSIS:  Medial meniscus tear, degenerative joint  disease, gouty arthropathy, left knee.   POSTOPERATIVE DIAGNOSIS:  Medial meniscus tear, degenerative joint  disease, gouty arthropathy, left knee.   PROCEDURE PERFORMED:  Left knee arthroscopy, partial medial  meniscectomy, debridement of tophaceous gouty deposits, all  compartments.   ANESTHESIA:  General.   No assistant.   BRIEF HISTORY INDICATION:  A 70 year old refractory knee pain, medial  meniscus tear, gouty arthropathy, is indicated for repeat debridement of  the knee and partial medial meniscectomy, locking, giving way mechanical  symptoms.  Risks and benefits discussed including bleeding, stress,  infection, damage to neurovascular structures, no change in symptoms,  worsening symptoms, need for repeat debridement, anesthetic  complications etc.   TECHNIQUE:  The patient in supine position after induction of adequate  anesthesia 1 gram of Kefzol, left lower extremity prepped draped in the  usual sterile fashion.  A lateral parapatellar portal and superomedial  parapatellar portal was fashioned with a #11 blade.  Ingress cannula  atraumatically placed.  Irrigant was utilized to insufflate the joint.  Noted was extensive tophaceous gouty deposits throughout the joint.  There were some grade 3 changes of the patella.  This was shaved with a  42 Cuda shaver introduced via the medial parapatellar portal.  There was  normal patellofemoral tracking.  Knee was copiously lavaged.  I  mechanically debrided the medial and lateral tibial plateau, medial and  lateral  femoral condyle, the menisci on the ACL of tophaceous gouty  deposits as best possible.  There was some recurrent tear actually the  anterior midportion of the medial meniscus and a straight basket was  introduced, utilized to perform partial medial meniscectomy was stable  base, further contoured with the 42 Cuda shaver, remnant of the medial  lateral meniscus were stable to probe palpation.  Probed posteriorly  where the entry to the meniscal cyst was then debrided that and palpated  the posterior aspect of the popliteus, tried to decompress that.  Continued meticulously to debride as much of the tophaceous gouty  deposits as possible.  Knee was then copiously lavaged and all  compartments including the gutter were reexamined.  There was no further  pathology amenable to arthroscopic intervention.  All instrumentation  was then removed.  Portals were closed with 4-0 nylon simple  sutures.  0.25% Marcaine with epinephrine was infiltrated in the joint  and wound was dressed sterilely.  He was awoken without difficulty  transported to recovery room in satisfactory condition.  The patient  tolerated the procedure well, no complications.      Susa Day, M.D.  Electronically Signed     JB/MEDQ  D:  02/14/2007  T:  02/15/2007  Job:  [id number]

## 2010-12-16 NOTE — Assessment & Plan Note (Signed)
[facility] OFFICE NOTE   DARY, DILAURO                   MRN:          [id number]  DATE:09/14/2007                            DOB:          [birth year]    The patient is a 70 year old white male with Crohn's disease status post  terminal ileum resection in 1978, status post partial small bowel  obstruction.  He had a hospitalization in March of 2006 for pancytopenia  related to combination of allopurinol and 6-mercaptopurin.  He since  then has done quite well off allopurinol.  He continues on 6-MP 50 mg a  day and Colchicine 0.6 mg two a day.  He is having diarrhea 5-6 times a  day.  Since his last appointment one year ago he has done quite well  with exception of one episode of what sounds like partial small bowel  obstruction.  Around Christmas 2008 after eating more than usual amount  of food.  He was distended for several days, constipation, and finally  had a breakthrough bowel movement and has been feeling just fine since  them.  Barium enema in 2006 done at [facility]  confirmed thickening of the neo terminal ileum consistent with Crohn's  disease.  He gets most of his medications at  the [facility]:  1. Beta blocker one daily.  2. Trazodone HCL 100 mg daily.  3. Terazosin 5 mg daily.  4. 6-mercaptopurin 50 mg p.o. daily.  5. Potassium supplements.  6. Paxil 30 mg daily.  7. Colchicine 6 mg two daily.  8. Fish Oil.  9. Vitamins.  10.Neurontin 100 mg p.o. b.i.d.  11.Omeprazole 20 mg p.o. daily.  12.B12 injections twice a month.  13.Atenolol 25 mg p.o. daily.   PHYSICAL EXAMINATION:  VITAL SIGNS:  Blood pressure 106/52, pulse 60,  weight 201 pounds and stable.  GENERAL:  The patient was alert and oriented in no acute distress.  HEENT:  Normocephalic and atraumatic.  NECK:  Supple.  LUNGS:  Clear to auscultation.  HEART:   Normal S1 and normal S2.  ABDOMEN:  Soft and scaphoid with a well-healed surgical scar in the  right lower quadrant.  Bowel sounds were normal active.  There was no  distention.  Liver edge at costal margin.  RECTAL:  Somewhat tender anal canal.  No external hemorrhoids.  Stool  was soft and hemoccult negative.  The patient has a history of internal  hemorrhoids.   IMPRESSION:  60. A 70 year old white male with narrow terminal neo ileum status post      terminal ileal resection in 1978 for Crohn's disease.  He had an      episode of partial small bowel obstruction recently precipitated by      an unusual amount of food.  Since then he has done well.  2. Chronic immunosuppression by 6-MP.  3. History of pancytopenia related to combination of allopurinol and 6-      MP.  4. Gastroesophageal reflux disease, well controlled.  5. Symptomatic internal hemorrhoids.   PLAN:  1. Small bowel follow  through to assess terminal ileum narrowing.  2. 6-MP.  3. Analpram cream samples 2.5% to use on p.r.n. basis.  4. CBC, CMET, and sed rate will be checked at the [facility]      tomorrow.  I will see her again in one year.     [name]. [name], MD  Electronically Signed    DMB/MedQ  DD: 09/14/2007  DT: 09/15/2007  Job #: [id number]   cc:   Bonne Dolores, M.D.

## 2010-12-16 NOTE — Op Note (Signed)
Derek [name], Derek [name]            ACCOUNT NO.:  [id number]   MEDICAL RECORD NO.:  [id number]          PATIENT TYPE:  AMB   LOCATION:  [facility]C                         FACILITY:  [facility]   PHYSICIAN:  Susa Day, M.D.    DATE OF BIRTH:  1940-12-02   DATE OF PROCEDURE:  07/13/2008  DATE OF DISCHARGE:                               OPERATIVE REPORT   PREOPERATIVE DIAGNOSES:  1. Gouty olecranon bursitis.  2. Small olecranon osteophyte   POSTOPERATIVE DIAGNOSIS:  Gouty olecranon bursitis.   PROCEDURE PERFORMED:  1. Excision of the olecranon bursa, left elbow.  2. Removal of gouty tophaceous deposit, left elbow.  3. Excision of small olecranon osteophyte.   SPECIMENS:  Bursa to pathology.   ANESTHESIA:  General.   ASSISTANT:  Rometta Emery, P.A.   BRIEF HISTORY:  A 70 year old with a history gap, unable to tolerate  allopurinol.  Persistent painful bursa despite injection, aspiration,  corticosteroid injection.  Indicated for excision of bursa.  Risks and  discussed including bleeding, infection, returned bursa, etc.   TECHNIQUE:  The patient in supine position.  After administration of  adequate anesthesia and 1 gram of Kefzol, left upper extremity was  prepped and draped in the usual sterile fashion with an arm tourniquet  inflated to 200 mmHg.  The elbow was flexed.  I made a transverse  incision over the olecranon, bluntly dissected.  Exuberant clear  synovial fluid was evacuated that had gouty tophaceous deposits in it.  We then spent the majority of time excising the bursa which was  infiltrated with gouty deposits.  This was sent to pathology.  The  tendon was intact.  This was down to the bone.  A small osteophyte  removed from the olecranon.  Tendon was intact.  We did not excise over  towards the cubital fossa.  Following this, there was good excision of  the bursa.  This was copiously irrigated, and the bursa was closed with  4-0 nylon running suture.  Wound was  dressed sterilely and injected with  Marcaine.  Compression dressing applied, placing a sling, extubated without  difficulty, transferred to recovery in satisfactory condition.   The patient tolerated the procedure well with no complication.  Tourniquet time 15 minutes.      Susa Day, M.D.  Electronically Signed     JB/MEDQ  D:  07/13/2008  T:  07/13/2008  Job:  [id number]

## 2010-12-16 NOTE — Letter (Signed)
May 01, 2008    Hemet Rutland, MD  [address]  [place], [place] [address]   RE:  Derek [name], Derek [name]  MRN:  [id number]  /  DOB:  Nov 13, 1940   Dear Clair Gulling:   Thank you so much for your letter concerning Mr. Shrout  renal  insufficiency and possible detrimental effect of 6-Mercaptopurine. As  you know, I have followed Mr. Bertoni for his Crohn disease for at  least 14 years. He has a recurrence of the Crohn disease at the  ileocolic anastomosis which was created after terminal ileal resection  for obstruction in 1978.  He has a mild narrowing of the anastomosis and  low-grade small bowel obstruction which makes it very important that we  treat his Crohn disease aggressively to prevent him from needing another  surgery. His 6-mercaptopurine has been vital to his treatment of the  Crohn disease.  If he has to stop it, the only alternative would be use  of biological such as Remicade or Humira.  As you know, these are quite  expensive and require a lot of patient's time for administration and  potentially serious sideeffects providing his insurance approves him.  He gets most of his medications from the [facility] at [facility].  I would like to talk to you about your  thoughts about continuing his 6-mercaptopurine, in terms of the benefits  vs risks of further renal disease so we both can weigh the benefit and  the risk of  his medication. I would like to discuss this with you  before you discontinue his 6-MP. I have asked hit to set up an  appointment with me.    Sincerely,      [name]. [name], MD  Electronically Signed    DMB/MedQ  DD: 05/01/2008  DT: 05/02/2008  Job #: [id number]

## 2010-12-16 NOTE — Discharge Summary (Signed)
Derek [name], Derek [name] NO.:  [id number]   MEDICAL RECORD NO.:  [id number]          PATIENT TYPE:  INP   LOCATION:  1509                         FACILITY:  [facility]   PHYSICIAN:  Milus Banister, MD   DATE OF BIRTH:  31-Oct-1940   DATE OF ADMISSION:  08/25/2008  DATE OF DISCHARGE:  08/29/2008                               DISCHARGE SUMMARY   DISCHARGE DIAGNOSES:  1. Small bowel obstruction, resolved.  2. Crohn's ileitis.  3. History of pancytopenia related to combination of allopurinol and 6-      MP.  4. History of deep venous thrombosis and pulmonary embolism in 2006.  5. Coronary artery disease.  6. History of hepatitis C.  7. Benign prostatic hypertrophy.  8. Chronic renal insufficiency.  9. History of incarcerated hernia.   DISCHARGE MEDICATIONS:  The patient to continue home medications  including:   1. Magnesium oxide.  2. Colchicine 0.6 mg daily.  3. Trazodone 150 mg at bedtime.  4. Potassium citrate 10 mEq daily.  5. Atenolol 25 mg daily.  6. Paxil 40 mg at bedtime.  7. Aspirin 81 mg daily.  8. 6-MP 50 mg daily.  9. Gabapentin 100 mg b.i.d.  10.Omeprazole 20 mg daily.  11.Terazosin 5 mg daily.  12.Multiple vitamin.  13.Fish oil.  14.Calcium carbonate twice a day.  15.Minocycline 100 mg daily.  16.Additionally the patient will take 20 mg of prednisone tomorrow      followed by 10 mg of prednisone the next day and then discontinue.   DISPOSITION:  Home in stable condition.   CONSULTATIONS:  None requested this admission.   PROCEDURES:  None performed this admission.   HOSPITAL COURSE:  [name] is a 70 year old male known to Dr. Delfin [name] in our practice for history of longstanding Crohn's ileitis.  He  was in the process of qualifying for Humira injections, but has  currently been maintained on 6-MP.  On the day of admission Mr.  Derek [name] developed nausea, vomiting, crampy abdominal pain, and  abdominal distention.  In the  emergency department an acute abdominal  series was compatible with small bowel obstruction.  NG tube was placed,  and low intermittent wall suction was started.  The patient had a known  history of chronic kidney disease, but his creatinine was higher than  baseline.  A Foley catheter was placed to monitor output.  Shortly into  his admission the patient developed mental status changes.  He dislodged  the NG tube and IV.  The patient and his wife requested the NG tube not  be replaced and since the patient had several hours of no nausea or  vomiting without the NG tube, we left it out.  The patient was  subsequently started on clear liquids which he tolerated well.  His 6-MP  which was initially held due to n.p.o. status was restarted along with  his other oral medications.  IV steroids which had been started on  admission were also discontinued.  The patient's urine output increased  his admission, and his creatinine fell from 2.02 on admission to 1.86.  By the  2nd day of admission [name] diet was advanced to full  liquids and subsequently to solids which he tolerated without  difficulty.  The patient was transferred to medical bed where he  remained stable.  On August 29, 2008 the patient was stable for  discharge.  Because he had received 3 days of IV steroids it was decided  to wean him with a couple of days of prednisone.  [name] was  given a hospital follow-up appointment with his primary care doctor, Dr.  Olevia [name].  He left in stable condition.  The patient was given a follow-up  appointment to see Dr. Olevia [name], his primary gastroenterologist.  While he  was supposed to be following up with the [facility] to try and qualify  for Humira, the patient would like to see Dr. Olevia [name].      Tye Savoy, NP      Milus Banister, MD  Electronically Signed    PG/MEDQ  D:  08/29/2008  T:  08/29/2008  Job:  [id number]

## 2010-12-16 NOTE — H&P (Signed)
[name], [name]            ACCOUNT NO.:  [id number]   MEDICAL RECORD NO.:  [id number]          PATIENT TYPE:  INP   LOCATION:  [place]                         FACILITY:  [facility]   PHYSICIAN:  Derek Banister, MD   DATE OF BIRTH:  May 28, 1941   DATE OF ADMISSION:  08/25/2008  DATE OF DISCHARGE:                              HISTORY & PHYSICAL   CHIEF COMPLAINT:  Acute onset of diffuse abdominal pain with nausea and  vomiting.   HISTORY:  [name] is a pleasant 70 year old white male known to  Dr. Delfin [name], primary patient of Dr. Caron [name], and also known to  [facility].  The patient has history of longstanding Crohn's  ileitis. He is status post resection of the terminal ileum in 1978 and  has had prior small bowel obstructions.  He was hospitalized in March of  2006 for pancytopenia which was related to a combination of allopurinol  and 6MP.  Since that time he had been maintained on 6MP at 50 mg a day.  He also has history of gout, bursitis, BPH, hep C, and chronic renal  insufficiency.  He has a baseline creatinine of 1.9.  He also has a  history of coronary artery disease, mild aortic regurgitation, and a  stable dilated aortic root.  He has a history of bilateral DVTs and PE  in 2006.  He is status post repair of an incarcerated hernia and is  status post cholecystectomy.   The patient presents at this time in his usual state of health with  acute onset of mid abdominal pain and cramping yesterday on the  afternoon of admission.  This progressed to abdominal distention,  bloating, pain and then nausea and vomiting.  He presented to the  emergency room, was seen and evaluated by Dr. Ardis [name], who is covering on-  call, and admitted with concern for a partial small bowel obstruction,  felt more likely related to mechanical cause i.e. adhesions than active  IBD.   CURRENT MEDICATIONS:  Magnesium oxide 400 b.i.d., colchicine 0.6 daily,  trazodone 150 q.h.s.,  potassium citrate 10 mEq daily, atenolol 25 daily,  Paxil 40 q.h.s., aspirin 81 mg daily, 6MP 50 daily, gabapentin 1000 mg  b.i.d. and omeprazole 20 daily, terazosin 5 mg daily. He takes  multivitamin, fish oil, and calcium.   ALLERGIES:  ALLOPURINOL.   PAST HISTORY:  As outlined above.   SOCIAL HISTORY:  The patient is married.  He is a retired Clinical biochemist.  He is a [place] vet, served [phone]. He is an ex-smoker.  No EtOH.   FAMILY HISTORY:  Negative for GI disease.   REVIEW OF SYSTEMS:  CARDIOVASCULAR:  Denies any recent chest pain or  anginal symptoms.  PULMONARY:  Negative for cough, shortness of breath  or sputum production.  GENITOURINARY:  Denies GI as outlined above.  MUSCULOSKELETAL:  Periodic bursitis.  NEURO:  History of depression.   REVIEW OF SYSTEMS:  Otherwise as in HPI.   PHYSICAL EXAM:  GENERAL APPEARANCE:  A well-developed white male in no  acute distress.  He is uncomfortable.  VITAL SIGNS: 97.2, 168/75, pulse  98, respirations 24.  HEENT: Nontraumatic, normocephalic.  EOMI, PERRLA.  Sclerae anicteric.  Neck is supple without nodes.  CARDIOVASCULAR:  Regular rate and rhythm with S1 and S2.  No murmur,  rub, or gallop.  PULMONARY:  Clear to A and P.  ABDOMEN: Firm, distended, tympanitic.  Bowel sounds are very quiet, and  he is diffusely tender. He has a midline incisional scar.  RECTAL:  Exam is not done at this time.  EXTREMITIES:  Without clubbing, cyanosis or edema.  NEURO:  The patient is alert and oriented x3, and exam is nonfocal.   IMPRESSION:  42. A 70 year old white male with acute small bowel obstruction, felt      more likely related to mechanical etiology, i.e. adhesions versus      exacerbation of inflammatory bowel disease.  2. Longstanding Crohn's ileitis status post a resection of the      terminal ileum in 1978.  3. Prior history of small bowel obstructions.  4. History of pancytopenia related to allopurinol and 6MP.  5. History of deep  vein thrombosis and pulmonary embolus 2006.  6. Coronary artery disease.  7. Mild aortic regurgitation.  8. History of hepatitis C.  9. Benign prostatic hypertrophy. .  10.Chronic renal insufficiency with baseline creatinine 1.9.  11.Status post cholecystectomy.  12.Status post repair of incarcerated hernia.   PLAN:  The patient is admitted to the service of Dr. Oretha [name] for IV  fluid hydration, pain control, NG decompression and will plan CT scan of  the abdomen and pelvis to further delineate. For details please see the  orders.      Derek Ba, PA-C      Derek Banister, MD  Electronically Signed    AE/MEDQ  D:  08/26/2008  T:  08/26/2008  Job:  [id number]   cc:   Derek Banister, MD  [place], [place] [address]   Derek [name], M.D.  Fax: [phone]

## 2010-12-16 NOTE — Assessment & Plan Note (Signed)
[place] OFFICE NOTE   Derek [name], FORTENBERRY                   MRN:          [id number]  DATE:12/19/2008                            DOB:          [birth year]    REFERRING PHYSICIAN:  Bonne Dolores, M.D.   HISTORY OF PRESENT ILLNESS:  The patient is a 70 year old male with a  history of Crohn disease followed by Dr. [name].  The patient has no  known history of coronary artery disease, but he has history of  bilateral pulmonary emboli with associated DVT and treated with Coumadin  in 2006.  He also has aortic root dilatation with mild-to-moderate  aortic regurgitation.  The patient's last MRI was on May 17, 2008.  At the sinotubular junction, the measurement was 3.9 cm at the level of  the sinus of Valsalva.  The maximum transverse diameter was 4.9 cm,  the  ascending aorta was 4.1 x 4.1 which was unchanged from prior readings.  The patient states that he is doing well.  He denies any chest pain,  shortness of breath, orthopnea, or PND.   MEDICATIONS:  1. Paxil 40 mg p.o. daily.  2. Colchicine 0.6 mg p.o. daily.  3. Omeprazole 20 mg p.o. daily.  4. Centrum multivitamin.  5. Atenolol 25 mg p.o. daily.  6. Gabapentin 100 mg p.o. b.i.d.  7. Aspirin 81 mg a day.  8. B12 every other week.  9. Potassium citrate 10 mg p.o. daily x2 tablets p.o. b.i.d.  10.Trazodone 150 mg p.o. nightly.  11.Fish oil 1 tablet p.o. b.i.d.  12.Terazosin 2 mg p.o. nightly.  13.Magnesium oxide 420 mg p.o. b.i.d.  14.Allopurinol 200 mg p.o. daily.  15.Entocort EC 3 mg p.o. daily.  16.Humira 40 mg injection 2 times a month.   PHYSICAL EXAMINATION:  VITAL SIGNS:  Blood pressure 114/76, heart rate  54, weight 239 pounds.  NECK:  Normal carotid upstroke.  No carotid bruits.  LUNGS:  Clear breath sounds bilaterally.  HEART:  Regular rate and rhythm.  Normal S1 and S2.  No murmurs, rubs,  or gallops.  ABDOMEN:  Soft,  nontender.  No rebound or guarding.  Good bowel sounds.  EXTREMITIES:  No cyanosis, clubbing, or edema.  NEURO:  The patient is alert, oriented, and grossly nonfocal.   PROBLEMS:  1. Ascending aortic aneurysm.      a.     Moderate aortic root dilatation, see details above.      b.     Dilatation at the sinus of Valsalva at 4.9 cm.      c.     Preserved left ventricular function.  No wall motion       abnormalities.  2. History of bilateral pulmonary emboli.  3. Crohn disease.  4. Renal insufficiency followed by Dr. Jimmy Footman.   PLAN:  1. The patient will be seen back in 6 months at which time a repeat      MRI is indicated particularly to follow the measurement at the      level of the sinus of Valsalva.  2. The patient clinically does not have any  significant aortic      regurgitation, and I did not repeat his echocardiographic study.      This may be done on the next visit.  3. The patient can follow up with [place] in 6 months as outlined above.     Ernestine Mcmurray, MD,FACC  Electronically Signed    GED/MedQ  DD: 12/19/2008  DT: 12/20/2008  Job #: [id number]   cc:   Bonne Dolores, M.D.

## 2010-12-16 NOTE — Assessment & Plan Note (Signed)
[place] OFFICE NOTE   Derek [name], Derek [name]                   MRN:          [id number]  DATE:12/09/2006                            DOB:          1940/09/14    REFERRING PHYSICIAN:  Bonne Dolores, M.D.   HISTORY OF PRESENT ILLNESS:  Patient is a 70 year old male from  [place] with an asymptomatic thoracic aortic aneurysm that is being  monitored.  The patient also has mild aortic insufficiency.  Patient has  a history of bilateral pulmonary emboli and DVT.  He has been doing,  however, quite well.  He has no substernal chest pain, orthopnea, or  PND.  An MRI study was done at the end of 2007, and demonstrated very  stable measurements of his left aneurysm.  The ascending aorta measured  4.15 cm x 4.13 cm, essentially unchanged from a prior study.   The patient presents now for followup.  He has no specific complaints.  He does report some right ankle edema, but he has no definite lower  extremity edema otherwise.   MEDICATIONS:  1. Paxil 40 mg a day.  2. Potassium 20 mEq p.o. b.i.d.  3. Trazodone 10 mg p.o. daily.  4. Mercaptopurine 50 mg p.o. daily.  5. Colchicine 0.6 daily.  6. Omeprazole 20 mg p.o. daily.  7. Centrum Silver.  8. Fish oil.  9. B-12.  10.Atenolol 25 a day.  11.Terazosin 5 mg p.o. nightly.  12.Gabapentin 100 mg p.o. b.i.d.  13.Aspirin 81 mg a day.   PHYSICAL EXAMINATION:  VITAL SIGNS:  Blood pressure is 113/64.  Heart  rate is 67 beats per minute.  Weight is 213 pounds.  NECK EXAM:  Normal carotid upstroke.  No carotid bruits.  LUNGS:  Clear breath sounds bilaterally.  HEART:  Regular rate and rhythm.  Normal S1 and S2.  No murmurs or  gallops.  ABDOMEN:  Soft.  EXTREMITY EXAM:  No cyanosis, clubbing or edema.   PROBLEM LIST:  1. Stable ascending aortic aneurysm.  2. Preserved left ventricular function.  3. History of bilateral pulmonary emboli and deep venous  thrombosis.  4. Recurrent gout.  5. Crohn's disease.   PLAN:  1. The patient will have a followup echo done for aortic root      dimension measurements in November of 2007.  2. No further cardiovascular testing appears to be indicated.  3. The patient can follow up with [place] in 6 months.     Ernestine Mcmurray, MD,FACC     GED/MedQ  DD: 12/09/2006  DT: 12/09/2006  Job #: [id number]   cc:   Bonne Dolores, M.D.

## 2010-12-16 NOTE — Assessment & Plan Note (Signed)
[facility]                          [facility] OFFICE NOTE   Derek [name], Derek [name]                   MRN:          [id number]  DATE:07/12/2007                            DOB:          1940-11-20    PRIMARY CARDIOLOGIST:  Derek Mcmurray, MD, [facility]   REASON FOR VISIT:  Six month followup.   Derek [name] returns to the clinic for continued monitoring of aortic  root dilatation. His last echocardiogram, in May 2007, suggested that  this was moderate, with mild aortic arch dilation and mild aortic  regurgitation. Left ventricular function was normal (55%), with no wall  motion abnormalities.   The patient has no history of coronary artery disease. He is on low dose  aspirin and atenolol.   The patient also has a history of bilateral pulmonary emboli with  associated DVT, treated with Coumadin in 2006.   Clinically, the patient denies any interim development of chest pain,  dyspnea, tachy palpitations, dizziness, or pre-syncope.   The patient does indicate that he has hypertriglyceridemia and is on  fish oil. We do not, however, have any pertinent laboratory data. He  states that this has not been checked in quite some time, but that he is  due for some followup blood work.   CURRENT MEDICATIONS:  1. Paxil.  2. Potassium 20 b.i.d.  3. Trazodone.  4. Mercaptopurine.  5. Colchicine 0.6 daily.  6. Omeprazole 20 daily.  7. Fish oil.  8. Atenolol 25 daily.  9. Terazosin 5 q.h.s.  10.Gabapentin.  11.Aspirin 81 daily.  12.B12 injections q weekly.   PHYSICAL EXAMINATION:  Blood pressure 105/72, pulse 54 and regular.  Weight 217.  GENERAL: A 70 year old male, mildly obese, sitting upright in no  distress.  HEENT: Normocephalic, atraumatic.  NECK: Palpable bilateral carotid pulses without bruits. Unable to assess  JVD secondary to neck girth.  LUNGS:  Diminished breath sounds at the bases, but without crackles or  wheezes.  HEART:  Regular rate and rhythm (S1, S2). No murmurs. No rubs.  ABDOMEN: Protuberant, nontender.  EXTREMITIES: Palpable distal pulses without significant edema.  NEURO: No focal deficit.   IMPRESSION:  1. Ascending aortic aneurysm.      a.     Moderate aortic root diltation by 2D echocardiogram, May       2007.      b.     Associated mild aortic regurgitation.      c.     Preserved left ventricular function, no wall motion       abnormalities.  2. History of bilateral pulmonary emboli/deep vein thrombosis.  3. Recurrent gout.  4. Crohn's disease.   PLAN:  Surveillance 2D echocardiogram for continued monitoring of aortic  root dilatation. If this shows no significant progression, then we will  continue monitoring this with followup 2D echocardiograms and the  patient can return to our clinic in approximately six months.      Derek Serpe, PA-C  Electronically Signed      Derek Mcmurray, MD,FACC  Electronically Signed   GS/MedQ  DD: 07/12/2007  DT: 07/12/2007  Job #: [id number]  cc:   Derek [name], M.D.

## 2010-12-16 NOTE — Assessment & Plan Note (Signed)
[place] OFFICE NOTE   Derek [name], Derek [name]                   MRN:          [id number]  DATE:05/08/2008                            DOB:          10/13/1940    HISTORY OF PRESENT ILLNESS:  The patient is a 70 year old male with a  history of Crohn disease followed by Dr. [name].   The patient also has known history of coronary artery disease.  He has a  history of bilateral pulmonary emboli with associated DVT and treated  with Coumadin in 2006.  He also has aortic root dilatation with mild-to-  moderate aortic regurgitation.  We are closely following this and the  patient is due for an MRI.   MEDICATIONS:  1. Minocycline 100 mg  p.o. daily.  2. Magnesium oxide 400 mg p.o. b.i.d.  3. Fish oil b.i.d.  4. Paxil 40 mg p.o. daily.  5. __________ 50 mg p.o. daily.  6. Colchicine 0.6 daily.  7. Omeprazole 20 mg daily.  8. Centrum Silver.  9. Atenolol 25 mg a day.  10.__________q.h.s.  11.Gabapentin.  12.Potassium.  13.Tums.  14.Trazodone 150 mg p.o. nightly.   PHYSICAL EXAMINATION:  VITAL SIGNS:  Blood pressure blood pressure  101/78, heart rate 58, weight 208 pounds.  NECK:  Normal carotid upstroke.  No carotid bruits.  LUNGS:  Clear breath sounds bilaterally.  HEART:  Regular rate and rhythm.  Normal S1 and S2.  No murmurs, rubs,  or gallops.  ABDOMEN:  Soft, nontender.  No rebound or guarding.  Good bowel sounds.  EXTREMITIES:  No cyanosis, clubbing, or edema.  NEURO:  The patient is alert, oriented, and grossly nonfocal.   PROBLEMS:  1. Ascending aortic aneurysm.      a.     Moderate aortic root dilatation.  Last MRI 2007.      b.     Associated mild-to-moderate aortic regurgitation.      c.     Preserved LV function.  No wall motion abnormalities.  2. History of bilateral pulmonary emboli.  3. Recurrent __________  4. Renal insufficiency, creatinine 1.9, now followed by Dr. Jimmy Footman.   PLAN:  1. The patient will have an MRI done to __________ aortic root, and      this is suspicious that a 2-D echo is __________ the size of aorta.      His aortic regurgitation seems to have slightly worsened __________      .  EKG in the office was done and is within normal limits.  2. The patient reports atypical chest pain but I do not think this      represents angina.  Probably in a year, we will plan on doing a      stress test.     Ernestine Mcmurray, MD,FACC  Electronically Signed    GED/MedQ  DD: 05/08/2008  DT: 05/09/2008  Job #: [id number]

## 2010-12-19 NOTE — Consult Note (Signed)
[name], [name] NO.:  [id number]   MEDICAL RECORD NO.:  [id number]          PATIENT TYPE:  INP   LOCATION:  4705                         FACILITY:  [facility]   PHYSICIAN:  Jeanie Cooks, M.D.DATE OF BIRTH:  1940-11-02   DATE OF CONSULTATION:  10/04/2004  DATE OF DISCHARGE:                                   CONSULTATION   HISTORY OF PRESENT ILLNESS:  Derek [name] is a 70 year old white  married male who I am asked to see in consultation on the evening of October 03, 2004 by Dr. Einar Gip to assist with this patient's hematologic management of  several complex problems. The patient was admitted to the hospital with  chest discomfort and suspected coronary syndrome. In addition, he was found  to have severe pancytopenia, which was felt to be due to 6-mercaptopurine  toxicity exacerbated by co-administration of Allopurinol. The patient  underwent an angio CT scan of the chest on the evening of October 03, 2004  after having basically a negative cardiac workup. The CT scan showed  multiple pulmonary emboli within branches of the right low, right middle,  and left lower lobes. At the time of the consultation, the patient's  platelet count was 54,000 and we were being consulted to help in the  management of this patient's anticoagulation in the face of moderately  severe pancytopenia, particularly thrombocytopenia.   Circumstances of this patient's admission relate to the fact that he has had  anorexia, nausea and vomiting for about 4 weeks. These symptoms apparently  started shortly after Allopurinol 300 mg daily was started, along with his  usual dose of 6-mercaptopurine 50 mg, which he has been on for about 6 or 7  years for his Crohns disease, which was diagnosed in the late 1970's. A  couple of days before admission, the patient experienced some left sided  chest pressure and heaviness. He had been having severe weakness and some  dyspnea on exertion. He was  seen by his primary care physician and referred  to a cardiologist and subsequently admitted to [facility] on October 02, 2004.   PAST MEDICAL HISTORY:  Notable for the history of Crohns disease diagnosed  in 3. The patient underwent an ileal and cecal resection. Apparently, he  had a very complicated course requiring a stay in the hospital of about 6  months at several different hospitals. He had an abdominal staph infection.  He apparently developed jaundice and hepatitis from blood transfusions  during that surgery. He later was found to have hepatitis C antibody but has  not required any treatment for this. The patient may have required  additional surgery at that time. He continues to have frequent diarrhea and  occasional bouts of gastrointestinal bleeding associated with his Crohns  disease, which has been under the care of Dr. [name]. Other problems  include ureterolithiasis requiring retrograde stone retrieval and  lithotripsy, history of post-traumatic stress disorder, depression and  anxiety. A history of gout for the past 7 or 8 years. Benign prostatic  hypertrophy and some gastroesophageal reflux disease in association with  hiatal hernia. The patient has also had an incarcerated umbilical hernia  with lysis of adhesions, a laparoscopic cholecystectomy by Dr. Johnathan Hausen in 1999. He suffered some shrapnel injuries which were relatively  minor in relationship to service in [place].   ALLERGIES:  No true allergies to any medicines. When he was on oral  steroids, there were some personality changes.   CURRENT MEDICATIONS:  Colchicine 0.6 mg q.d., 6-mercaptopurine 50 mg q.d.  This has been stopped in view of the pancytopenia. Trazodone 100 mg q.h.s.,  Terazosin 5 mg q.d., Paxil 40 mg at bedtime, Valium 5 mg as needed,  mesalamine 400 mg q.d., Allopurinol 300 mg q.d. (this was stopped a couple  of days ago). Nexium 40 mg q.d. (started September 18, 2004).  Ranitidine 150  mg q.d. and vitamin B12 shots given monthly.   FAMILY HISTORY:  Father died from a heart attack in his 81's. Mother  apparnetly had a ruptured abdominal aortic aneurysm. A sister died sudden of  a ruptured cerebral aneurysm. Strong family history of hypertension. No  history of inflammatory bowel disease, blood clots, or cancer.   SOCIAL HISTORY:  The patient had been a smoker, 1 pack per day for 30 years.  Stopped smoking 15 years ago. He occasionally chews tobacco. No significant  use of alcohol in the past and he does not drink alcohol at present. The  patient has been married for 33 years. Lives in [place] with his wife. He is  on disability. 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 The patient remains active with volunteer work for [employer] for  [employer] on [employer]. He drives and has been active and in fairly  good health in recent years. Last admission to the hospital apparently was  October 2001, when the patient presented with either a flare of his Crohns  disease or viral gastroenteritis with severe diarrhea and light headedness.   REVIEW OF SYSTEMS:  No prior history of blood clots. The patient denies any  neurologic problems. Vision is good. Hearing may be slightly decreased. The  patient does wear glasses. No sinus problems or hay fever. No apparent  cardiac or pulmonary problems in the past. He was hospitalized for pneumonia  at one time in [place]. Weight has decreased over the past couple of  weeks, about 9 pounds in association with the symptoms referred to above of  anorexia, nausea, vomiting, and weakness. He has chronic diarrhea with about  10 stools of watery diarrhea a day. Occasionally there will be some blood  but there has been no massive bleeding or any need for recent blood  transfusions. The patient does have some dyspepsia. There is also a history of hepatitic C antibody from blood transfusions. He has a  history of benign  prostatic hypertrophy and kidney stones. No peripheral edema, previously  blood clots. He does have some arthritis in his ankles related to gout. No  fever. He did have a skin cancer removed on the side of his nose. He does  have some rash on his face occasionally. He does have a history of  depression and post traumatic stress disorder.   PHYSICAL EXAMINATION:  GENERAL:  Mr. Salomon is a fairly well developed  gentleman. Looks generally healthy.  VITAL SIGNS:  Weight is about 200 pounds. Height 6 feet. Blood pressure  92/54, pulse 71 and regular. Respirations regular and non-labored.  Temperature 97.8. O2 saturation on room air is 95%.  HEENT:  No scleral icterus. Pupil extraocular movements are normal. There is  a recent healing abrasive scar on the right side of his nose where he had a  skin cancer removed. Mouth and pharynx are benign.  NECK:  Without adenopathy, thyroid enlargement or bruit.  HEART/LUNGS:  Normal.  ABDOMEN:  He does have a large scar from surgery in the right abdomen  running vertically. This is well healed. No organomegaly or masses palpable.  No distention or ascites.  EXTREMITIES:  No peripheral edema, clubbing, palmar erythema, petechia, or  purpura.  NEUROLOGIC:  Examination is grossly normal.   LABORATORY DATA:  On admission on October 02, 2004 white count 2.7, ANC 2.3  with 85% neutrophils. Hemoglobin and hematocrit 8.1 and 22.7. Platelets  67,000. Chemistries were notable for bilirubin of 2.3, BUN 18, creatinine  1.9, albumin 3.7. Cardiac enzymes were negative. D-dimer was 3.17.  Subsequently on the morning of October 03, 2004 the white count was 2.1,  hemoglobin and hematocrit 7.2 and 20.2 with platelets 54,000. Later that  evening at about 11:30 p.m. on October 03, 2004 white count was 1.6, hemoglobin  and hematocrit 6.3 and 17.5 with platelets of 47,000 and ANC 1.3. It should  be noted that the patient was started on unfractionated heparin  1,400 units  intravenously with bolus loading dose late last night. The patient also  received 2 units of packed red cells and 1 unit of pheresed platelets. This  morning on October 04, 2004, the white count is 2.0, hemoglobin and hematocrit  7.0 and 19.6 with platelets of 61,000. Sodium 140, potassium 3.6, chloride  109, CO2 25, glucose 103, BUN 14, creatinine 1.8, and calcium 8.3. It should  be noted that on September 17, 2004 white count was 3.3, hemoglobin and  hematocrit 11.4 and 32.9 with platelets of 186,000. Neutrophils were 70.6  with an ANC of 2.4.   IMPRESSION/PLAN:  1.  Pancytopenia most likely secondary to Allopurinol and 6-mercaptopurine      interaction with decreased metabolism of 6-MP, causing increased bone      marrow toxicity. As noted above, the patient's 6-MP and Allopurinol have      been discontinued. Note that the patient is on aspirin 81 mg daily. We     will discontinue that in view of the thrombocytopenia. Unfortunately,      the patient's blood counts may continue to decrease, as is certainly      suggested by the CBC's in the last couple of days. Since this patient is      in need of anticoagulation with heparin, we will plan to transfuse      liberally to maintain a platelet count of at least 60,000 and a      hemoglobin of at least 8 grams percent. Of concern is the fact that      today, after 2 units of packed red cells, the patient's hemoglobin is      only 7.7. Even though he is not having any obvious gastrointestinal      bleeding, this is certainly a concern. His stools are being monitored.      We will plan to give him Aranesp 100 mcg subcutaneous on a weekly basis      in the hopes of stimulating some red cell production. Stools will need      to be carefully monitored. We will also check LDH and haptoglobin to      rule out  the possibility of hemolysis, which  I think is unlikely. The      patient will receive 2 more units today.  2.  Pulmonary emboli  of uncertain origin and etiology. There are no risk      factors for pulmonary embolism and the patient does not appear to have      clinic deep vein thrombosis. Certainly his Crohns disease may be a      factor. A hyper-coagulable panel was drawn prior to the start of      heparin. The patient will have Doppler's that have been ordered. One may      need to look for a cardiac source. In view of the problems with the      patient's platelets, we will obtain a baseline HIT panel. I wound      recommend that Coumadin be held until the patient's blood counts      stabilize and we see some increase in his platelet count. I certainly      would not use low molecular weight heparin under these circumstances.      Unfortunately, the risk for bleeding is increased under these      circumstances. We will follow the patient with you.   Thank you for this interesting consultation. I have spoken extensively with  the patient and his family.      DSM/MEDQ  D:  10/04/2004  T:  10/04/2004  Job:  [id number]   cc:   Leslye Peer, MD  Fax: [place]. Einar Gip, [place], [address]  [place] [address]  Fax: [phone]

## 2010-12-19 NOTE — H&P (Signed)
Derek [name], Derek [name]            ACCOUNT NO.:  [id number]   MEDICAL RECORD NO.:  [id number]          PATIENT TYPE:  INP   LOCATION:  A320                          FACILITY:  [facility]   PHYSICIAN:  Bonne Dolores, M.D.    DATE OF BIRTH:  [birth year]   DATE OF ADMISSION:  10/14/2004  DATE OF DISCHARGE:  LH                                HISTORY & PHYSICAL   CHIEF COMPLAINT:  Fever/sepsis.   HISTORY OF PRESENT ILLNESS:  This is a 70 year old male with a relatively  complicated history.  He has a longstanding history of Crohn's disease and  is status post two abdominal procedures for this.  This has been quiescent  for some time now.  He also has post-traumatic stress disorder and is  followed by [facility] psychiatry for this; this has been under excellent control.  He has a longstanding history of gout as well as recurrent nephrolithiasis.  He is status post cholecystectomy in the remote past.   Most recently the patient was admitted to [facility] (approximately 10  days ago) when a chest pain workup revealed bilateral pulmonary emboli  secondary to DVT of the lower extremity.  He was heparinized with Coumadin  crossover and did relatively well during his hospitalization.  The patient  was found to have significant pancytopenia.  Hematology (Dr. Ralene Ok) was  consulted.  It was found that the pancytopenia was secondary to combination  of allopurinol and mercaptopurine.  These medications were held and his  counts slowly rose.  He was discharged on October 11, 2004.   Patient presented to the office on October 12, 2004 complaining of pain and  fever at IV site of the left wrist (dorsum).  It was clinically evident  this was cellulitis.  He was also noted to be febrile though nontoxic.  A  CBC was obtained as well as blood cultures.  CBC revealed a white count of  2100 with 85% segs and a hemoglobin of 12 and platelet count 150,000.  He  was treated with Claforan as well as Keflex.  He was  instructed to return  the following morning which he did which was this morning.  He was found to  be febrile.  Another Claforan was administered.  He refused hospitalization.  Soon after he left the office his blood culture from the previous day was  reported to be positive for gram-positive cocci in clusters.   The patient was contacted and instructed to come immediately to the hospital  for IV antibiotics.  I have discussed this case with Dr. Ralene Ok who agreed  with local hospitalization.   There is no history of headache, neurologic deficits, chest pain or  recurrent shortness of breath.  He has had some nausea and vomiting but no  melena, hematemesis, hematochezia.  He does have mild dysuria.   The patient is admitted with gram-positive sepsis which is very possibly  hospital acquired and apparently from an infected IV site as described.   CURRENT MEDICATIONS:  Colchicine 0.6 b.i.d., trazodone 100 once daily,  terazosin 5 mg at bedtime, Paxil 40 once daily, B12 every month,  Asacol 400  mg t.i.d., Nexium 40 mg once daily and Coumadin current dose at 2.5  alternating with 5 mg.  He also takes fish oil.   ALLERGIES:  None reported.   PAST MEDICAL HISTORY:  Reviewed above.   FAMILY HISTORY:  Noncontributory.   REVIEW OF SYSTEMS:  Negative except as mentioned.   SOCIAL HISTORY:  Nonsmoker, nondrinker, retired Clinical biochemist.   PHYSICAL EXAMINATION:  GENERAL:  This is a very pleasant fully alert but  acutely ill male.  VITAL SIGNS IN THE OFFICE:  Blood pressure 140/80, heart rate approximately  90, temperature is 102 orally.  HEENT:  Normocephalic, atraumatic.  The pupils are equal.  Ears, nose and  throat are benign.  Neck is supple without masses or bruits.  CHEST:  Lungs are clear to A&P.  Heart sounds normal.  ABDOMEN:  Nontender, nondistended, bowel sounds are intact.  EXTREMITIES:  No clubbing, cyanosis or edema.  Erythema of the dorsum of the  left wrist has improved  since institution of antibiotic therapy  approximately 36 hours ago.  It is still somewhat erythematous and tender.   ASSESSMENT:  Gram-positive sepsis most likely secondary to infected  intravenous site.  Strongly consider methicillin-resistant Staphylococcus  aureus.  Final culture and sensitivities are currently pending.  He is  moderately leukopenic though his absolute neutrophil count is reasonable.   PLAN:  Vancomycin to be administered promptly after repeat blood cultures,  we will continue home meds and comfort measures and so forth, we will follow  and treat expectantly.      MC/MEDQ  D:  10/14/2004  T:  10/14/2004  Job:  [id number]

## 2010-12-19 NOTE — Consult Note (Signed)
Derek [name], Derek [name]            ACCOUNT NO.:  [id number]   MEDICAL RECORD NO.:  [id number]          PATIENT TYPE:  INP   LOCATION:  A320                          FACILITY:  [facility]   PHYSICIAN:  Alison Murray, M.D.DATE OF BIRTH:  [birth year]   DATE OF CONSULTATION:  DATE OF DISCHARGE:                                   CONSULTATION   REASON FOR CONSULTATION:  Renal failure and metabolic acidosis.   HISTORY OF PRESENT ILLNESS:  [name] is a 70 year old white male who  has multiple medical problems including a history of Crohn's disease,  history of DVT, history of kidney stones, gout, and pancytopenia presently  admitted to the hospital because of cellulitis of his hand and sepsis.  After the patient was admitted and put on antibiotics, he was found to have  azotemia and severe metabolic acidosis.  Hence a consult is called.  According to the patient, he was diagnosed with Crohn's disease in 1978 and  initially he had ileal removal but after awhile they went back to do a  removal of a part of the small intestine and the large intestine.  Since  then he has been doing fairly well with Imuran and also Asacol until  recently when he was put on Allopurinol because of gout.  Following that the  patient developed pancytopenia, where the Allopurinol was discontinued.  At  this moment as stated above, the patient is admitted with sepsis and  __________ azotemia.  He seems to be improving but he has severe metabolic  acidosis.  Hence a consult is called.   PAST MEDICAL HISTORY:  1.  He has a history of Crohn's disease.  2.  History of DVT.  3.  History of multiple kidney stones.  4.  History of gout.  5.  He also has a history of depression.  6.  Post traumatic syndrome.  7.  GERD.  8.  History of pancytopenia.  9.  Questionable history of also PE.   PAST SURGICAL HISTORY:  1.  He has a history of ileo and cecal removal.  2.  History of cholecystectomy.  3.  History of  multiple kidney stones.  4.  Also history of __________ hernia status post lysis of adhesions in      1998.   ALLERGIES:  No known allergies.   MEDICATIONS:  1.  He is getting IV fluids with 2 amps of sodium bicarbonate at 40 mL/hour.  2.  He is on Colchicine 0.6 mg b.i.d.  3.  Folic acid 1 mg p.o. daily.  4.  Asacol 400 mg p.o. t.i.d.  5.  Protonix 40 mg p.o. daily.  6.  Paxil 40 mg p.o. daily.  7.  He is also getting vancomycin 1000 g IV q.24h.  8.  Codeine 7.5 mg p.o. daily.  9.  Imodium on a p.r.n. basis.   SOCIAL HISTORY:  No history of smoking or alcohol abuse.   REVIEW OF SYSTEMS:  Nausea but no vomiting.  He feels somewhat weak.  He  still has some diarrhea.  On normal days he says that he has diarrhea four-  five.  However, since his time in the hospital he has multiple watery bowel  movements, which are basically his baseline.  He denies any urgency or  frequency.   PHYSICAL EXAMINATION:  VITAL SIGNS:  Blood pressure today is 104/65,  temperature 98.9, pulse of 89, respiratory rate is 20.  HEENT:  No conjunctival pallor.  No icterus.  Oral mucosa seems to be dry.  LUNGS:  No rales, no rhonchi, no egophony.  HEART:  Reveals a regular rate and rhythm.  No murmurs.  ABDOMEN:  Soft.  Positive bowel sounds.  EXTREMITIES:  No edema.  His input is 1800, output of 400.   BLOOD WORK:  His BUN is 15, creatinine 1.7, sodium 173, potassium 2.9, CO2  of 17, glucose 117.  His creatinine has been as high as 1.9.  Prior to that  it was 1.6.  His white blood cell count is 2.7.  It has been as low as 1.  Hence it is improving.  Hemoglobin 10.3, hematocrit 28.2, platelets of 172.  His blood gas pH is 7.347, PCO2 of 24, and O2 saturation is 97%.  His UA the  specific gravity is 1.02.  He has __________ small protein and ductal  squamous.  Blood culture preliminary no growth on October 14, 2004.   ASSESSMENT:  1.  Renal insufficiency probably acute.  However, since the patient has a       recurrent history of a kidney stone, I am not sure if he has underlying      renal insufficiency.  The last creatinine, which was __________, is 1.6.      Presently that seems to be improving.  Hence, probably he might have      __________ syndrome superimposed on chronic.  2.  Metabolic acidosis.  Calculated anion gap is 13.  At this moment we do      not have any albumin.  Hence,  probably anion gap, if that is the case,      this could be probably related to bicarbonate losses through      gastrointestinal.  However, we do not have a urine osmolality to      determine that.  3.  Anemia.  Probably part of pancytopenia related to his medication.  It is      a combination probably of Allopurinol and Imuran.  The Allopurinol is      discontinued.  The white blood cell count is improving.  The anemia      still remains the same.  4.  Pancytopenia.  As stated above.  5.  History of deep venous thrombosis.  6.  History of hypokalemia.  Probably this also could be secondary to renal      loss.  7.  History of gout.  8.  History of kidney stones.  9.  History of gastroesophageal reflux disease.   RECOMMENDATIONS:  We will do a urine osmolality, urine sodium, urine also  chloride and potassium.  IV with sodium bicarbonate supplement in his IV  fluids.  We will increase his IV fluids.  We will start him also on  potassium.  Probably we will give him a run of potassium.  We will continue  his other medication as before and will follow the patient.      BB/MEDQ  D:  10/16/2004  T:  10/16/2004  Job:  [id number]

## 2010-12-19 NOTE — Letter (Signed)
May 28, 2006    Weber Cooks, M.D.  [place], [place] [address]   RE:  WHITNEY, BINGAMAN  MRN:  [id number]  /  DOB:  1940-12-14   Dear Dr. Beola Cord:   Mr. Callaham was seen in our clinic today for scheduled follow-up.  He  indicated that he is awaiting clearance to undergo right foot surgery.  He  is stable from a cardiovascular standpoint and does not require any  additional cardiac workup at this time.   Therefore, Mr. Gutridge is clear to proceed with right foot surgery, as  planned, with no further cardiac recommendations.   We will plan on having the patient follow up with [place] in the clinic, as  scheduled.    Sincerely,      ______________________________  Mannie Stabile, PA-C    ______________________________  Ernestine Mcmurray, MD,FACC   GS/MedQ  DD: 05/28/2006  DT: 05/30/2006  Job #: [id number]

## 2010-12-19 NOTE — Discharge Summary (Signed)
[facility]  Patient:    Derek [name], Derek [name]                   MRN: [id number] Adm. Date:  [id number] Disc. Date: [id number] Attending:  Neita Garnet Dictator:   Derek [name], P.A.-C                           Discharge Summary  ADMITTING DIAGNOSES: 44. A 70 year old white male with Crohns ileocolitis, stable with acute    diarrheal illness with volume depletion, rule out infectious colitis    superimposed on Crohns, rule out possible Clostridium difficile. 2. Status post multiple abdominal surgeries including lysis of adhesions,    small-bowel resection, small-bowel obstructions, cholecystectomy, and    repair of incarcerated hernia. 3. Ureterolithiasis. 4. Post-traumatic stress disorder. 5. Anxiety. 6. Hepatitis C. 7. Gout. 8. Benign prostatic hypertrophy. 9. New complaint of calf pain, bilateral, etiology not clear.  DISCHARGE DIAGNOSES: 1. Crohns ileocolitis. 2. Acute diarrheal illness, suspect acute infectious etiology, possibly viral    superimposed on Crohns. 3. Status post multiple abdominal surgeries including lysis of adhesions,    small-bowel resection, small-bowel obstructions, cholecystectomy, and    repair of incarcerated hernia. 4. Ureterolithiasis. 5. Post-traumatic stress disorder. 6. Anxiety. 7. Hepatitis C. 8. Gout. 9. Benign prostatic hypertrophy.  CONSULTATIONS:  None.  PROCEDURES:  Abdominal films.  Arterial and venous Dopplers.  BRIEF HISTORY:  Derek [name] is a pleasant 71 year old white male known to Dr. Delfin [name] with history as described above.  He does have a significant history of post-traumatic stress disorder and depression as well as anxiety.  He has been stable as far as his Crohns has been concerned and has been relatively quiescent for some time on Asacol and 6-MP.  He was seen for routine follow-up in the office last week and at that time was not having any difficulty.  His 6-MP was increased to 75 mg  per day, but he had not actually switched over at the time of his presentation to the emergency room.  At this time, he has had onset on May 08, 2000, of severe watery diarrhea with up to 20 bowel movements per day which has continued over the past three days.  He had not noted any melena but has seen a little bit of bright red blood on the tissue. No fever or chills, no nausea or vomiting, but says his appetite has been poor, and he has not eaten anything as everything "goes straight through him." He also complained of some burning abdominal discomfort and rectal discomfort secondary to diarrhea.  No other family members have been ill.  The patient did take a course of sulfa about six weeks ago for a urinary tract infection. The patients usual habits are to have 7-8 bowel movements per day since he has had a couple of small-bowel resections.  The patient presented to the office today without an appointment, was felt to be acutely ill, was light-headed with standing, and was brought over to the emergency room for hydration and evaluation.  At this time, he is admitted to the hospital for continued hydration, stool culture, symptom control, and suspect that he has an acute infectious diarrhea superimposed on his Crohns.  LABORATORY STUDIES:  Stool for C. diff negative, stool for O&P negative, stool cultures negative.  On admission, WBC 5.6, hemoglobin 12.9, hematocrit 36.2, MCV 85.5, platelets 126.  Follow-up on May 12, 2000, WBC 4.6, hemoglobin  13, hematocrit 36.8, MCV 86, platelets 108.  Electrolytes normal on admission with potassium 4.4, BUN 17, creatinine 0.6, glucose 96, albumin 3.7.  Liver function studies are entirely normal.  Sed rate was 11.  Abdominal films on admission - normal gas pattern.  HOSPITAL COURSE:  The patient was admitted to the service of Dr. Henrene [name], who was covering the hospital.  He was placed on IV fluids and started empirically on oral Flagyl.  We placed  him on Demerol and Phenergan as needed, as well as Levsin, also started him on Rowasa suppositories b.i.d.  He was continued on his home medications and obtained stool cultures.  Initial abdominal films were negative for any evidence of obstruction.  The patient continued to complain of bilateral calf pain, worse with ambulation despite having normal electrolytes.  Therefore, he underwent arterial and venous Dopplers which were negative.  He had a benign hospital course, continued to complain of multiple bowel movements per day which gradually improved.  We were able to advance his diet, also started him on antidiarrheals which he refused to take.  By October 11, he was having perhaps 12 bowel movements a day, perhaps less.  His normal is about 8 bowel movements per day.  He was able to tolerate solid food.  He had no further complaints of abdominal pain.  No fever and negative cultures and was allowed discharge to home with instructions to follow up with Dr. Olevia [name] on June 14, 2000, at 10:40 a.m. and to call if any problems in the interim.  DISCHARGE MEDICATIONS:  1. Flagyl 250 q.i.d. x 7 days.  2. Imodium or Lomotil p.r.n.  3. Rowasa suppositories q.h.s. x 1 week for complaints of rectal discomfort.  4. Asacol 400 increase to 3 p.o. b.i.d.  5. Colchicine 0.6 q.d.  6. Valium 5 mg q.d. as previous.  7. Paxil 40 mg q.d. as previous.  8. Terazosin 5 mg p.o. q.h.s. as previous.  9. Trazodone 25 q h.s. as previous. 10. 6-MP 75 mg p.o. q.d. 11. Zantac 150 q.d. as previous.  DIET:  Low-residue until diarrhea resolves.  CONDITION ON DISCHARGE:  Stable and improved. DD:  05/13/00 TD:  05/15/00 Job: [id number]

## 2010-12-19 NOTE — Discharge Summary (Signed)
Derek [name], Derek [name]            ACCOUNT NO.:  [id number]   MEDICAL RECORD NO.:  [id number]          PATIENT TYPE:  INP   LOCATION:  2035                         FACILITY:  [facility]   PHYSICIAN:  Leslye Peer, MD       DATE OF BIRTH:  [birth year]   DATE OF ADMISSION:  10/02/2004  DATE OF DISCHARGE:  10/12/2004                                 DISCHARGE SUMMARY   ADMISSION DIAGNOSES:  1.  Chest discomfort.  2.  Intolerance to allopurinol with symptoms of nausea, vomiting, dry      heaves, anorexia, subsequently found to have 6-MP toxicity and      pancytopenia.  3.  Orthostatic.  4.  Palpitations.  5.  Dyspnea on exertion.  6.  Gout.  7.  Gastroesophageal reflux disease.  8.  Benign prostatic hypertrophy.  9.  Depression.  10  Crohn's disease, status post surgery.  1.  Hepatitis C, in remission.  2.  Nephrolithiasis.  3.  Hiatal hernia.  4.  Hyperlipidemia.   DISCHARGE DIAGNOSES:  1.  Chest discomfort.  2.  Intolerance to allopurinol with symptoms of nausea, vomiting, dry      heaves, anorexia, subsequently found to have 6-MP toxicity and      pancytopenia.  3.  Orthostatic.  4.  Palpitations.  5.  Dyspnea on exertion.  6.  Gout.  7.  Gastroesophageal reflux disease.  8.  Benign prostatic hypertrophy.  9.  Depression.  10  Crohn's disease, status post surgery.  1.  Hepatitis C, in remission.  2.  Nephrolithiasis.  3.  Hiatal hernia.  4.  Hyperlipidemia.  5.  Bilateral pulmonary emboli this admission.  6.  Add left deep vein thrombosis, acute, this admission.  7.  Has 6-MP toxicity secondary to allopurinol.  8.  Pancytopenia secondary to above status post transfusions of red blood      cells and platelets this admission status post hematology consultation.  9.  Recurrent hypokalemia status post multiple repletions this admission.   HISTORY OF PRESENT ILLNESS:  Mr. Broner is a 70 year old white male with  past history as listed above.  He is followed by Dr. [name] for his GI  issues.  He saw Dr. Mathis Bud for office evaluation on October 02, 2004.  He  reported that he had been on allopurinol which had been started about three  weeks prior for his gout.  Since that time, he developed marked nausea,  vomiting, dry heaves, anorexia and a 9 pound weight loss and very little  intake over the past 7-10 days.  He stopped the allopurinol on March 2 and  subsequently received a letter from Dr. [name] to discontinue the medication  as well.  He continued to feel quite poorly.   As well, he reported to Dr. Mathis Bud that he was awakened that morning with  chest discomfort.  He described this as a tightness and a band across the  lower bilateral chest and up in the bilateral epigastrium.  He also noted it  in the left chest.  There was no radiation of the discomfort.  No true  shortness of breath.  He was unable to tell if there was any difference in  his nausea or emesis as he had been having problems with that anyway.  He  denied diaphoresis.  He reported having some indigestion which occurred  every few months but resolved promptly with Tums.  This does not necessarily  occur after eating, but not necessarily exertion related either.  He says  that his chest discomfort that morning was different from his normal  indigestion.  The duration was approximately one hour.  It was not  exertionally related.  He has spontaneous resolution.  At the time of Dr.  Martie Round evaluation, he was chest pain free.  He had not had significant  increase dyspnea on exertion.  He had some lightheadedness, but it was felt  to be secondary to his poor p.o. intake.  He had had frank syncope or  presyncope.  No palpitations.   On physical examination, vital signs were blood pressure 100/60, heart rate  96.  EKG showed sinus without ischemic changes.  At that time, Dr. Mathis Bud  planned hospital admission for rehydration and for evaluation of chest pain  including checking  enzymes to rule out MI.  He would need lab work, and at  some point would need an echo and a Cardiolite.  If he were to rule in, then  we would plan for cardiac catheterization instead.  He will need a chest x-  ray.   On October 03, 2004, he underwent GI consultation and was seen by Dr. Fuller Plan.  At that point, they felt that the nausea/vomiting might be secondary to  toxicity from 6-MP.  Also, was found to be pancytopenic secondary to 6-MP  toxicity.  They noted that he has a long history of Crohn's which is  generally stable until recently.  Note that the labs were notable for white  count 2.1, hemoglobin 7.2, hematocrit 20.2, platelets 54.  It was felt by  Dr. Fuller Plan that the pancytopenia, nausea and vomiting were all likely side  effects from 6-MP metabolite toxicity.   On October 03, 2004, CT of the chest showed multiple pulmonary emboli.  There  was some in the left lower lobe, left middle lobe and right lower lobe.  Dr.  Einar Gip discussed the findings with the patient and also discussed the fact  that this was a difficult situation, given the need for anticoagulation and  the patient's pancytopenia.  He discussed the case with Dr. Ralene Ok who was  on call for hematology.  He discussed the case with Dr. Ralene Ok.  He agreed  that the patient would need full anticoagulation.  They reviewed that the  platelets and white count were down over the last two days and would expect  him to further decrease until the 6-MP effects subside.  Thus, the patient  would need Coumadin.  We plan to discuss with GI whether they need to do any  procedures prior to Coumadin load.   Dr. Ralene Ok planned to obtain hypercoagulable panel prior to starting  anticoagulation.  He planned for baseline HIT panel.  He planned to be  cautious with heparin and start tonight without bolus.  He would use  unfractionated heparin, not low molecular weight heparin.  He would transfuse packed red blood cells two units and one  bag of platelets.  He  would check daily CBCs, try to keep platelet count greater than 60,000 with  platelet transfusions.  He planned for no aspirin or NSAIDs and he  would  start Coumadin when platelets count stable.  He subsequently received the  above transfusions.   On October 04, 2004, he was without chest pain.  Nausea was improved.  Platelets were up to 61,000.  Hemoglobin was 70.  He was continued on  heparin at that point.   On September 06, 2004, he was seen by Dr. Ralene Ok again.  He planned for  further units of packed red blood cells.  Planned to discontinue aspirin.   On October 05, 2004, he was on heparin.  He was having no spontaneous bleeding.  Platelets were at 52,000.  White count down to 1.9.   He was seen in follow up by Hematology on October 05, 2004.  We planned to give  further platelets that day, secondary to high risk bleed with heparin  therapy and platelets 52,000.   On October 06, 2004, was having no specific complaints.  He as seen by Dr.  Einar Gip.  He planned to have GI see the patient again to see whether we needed  to do further studies prior to loading Coumadin.  He underwent Duplex of the  lower extremity on October 06, 2004.  This shows acute DVT throughout the  entire popliteal vein.  Mild flow noted.  All other deep veins appeared  patent.  No evidence of superficial thrombosis or Baker's cyst.   He was seen in followup by GI later that day.  They planned to proceed with  colonoscopy, EGD the following day prior to loading Coumadin.   On October 07, 2004, he underwent colonoscopy.  This showed enteritis of the  small intestine and hemorrhoids internal.   EGD on October 07, 2004, showed gastritis, unspecified.  Tolerated procedures  without complications.  GI signed off and planned for the patient to see Dr.  [name] back in the office October 22, 2004.   He was seen in follow up by hematology on the evening of October 07, 2004.  That is when IV heparin was being resumed  post EGD colonoscopy.  They noted  the acute DVT findings, and also reviewed CBC.  Therefore, the  hypercoagulable panel was negative.  At that point, they felt it was okay to  start Coumadin from their perspective.   On October 08, 2004, Coumadin was started.  Continue on heparin crossover.   Over the next several days, he remained fairly stable.  His CBC slightly  improved.  He was continued on heparin and Coumadin load awaiting  therapeutic INR.   By October 12, 2004, his INR was therapeutic at 2.9.  At this point, he is  having no chest pain or shortness of breath.  He has been ambulating without  difficulty.  He is afebrile at 98.7, pulse in the 50s-60s, blood pressure  126/76, oxygen saturation 98% on room air.  Labs show white count 14,  hemoglobin 9.8, hematocrit 27.3, platelets 129.  Sodium 143, potassium down  at 2.9 which is repleted prior to discharge home.  BUN 7, creatinine 1.6. INR therapeutic at 2.9.  Lungs are clear.  Heart in regular rhythm.  There  is no lower extremity edema.  Telemetry shows sinus rhythm without  arrhythmia.  At this point, he is seen and evaluated by Dr. Odette Fraction.  We plan to replete potassium, follow up potassium and magnesium levels, and  once they are repleted, plan for discharge home.   He is deemed stable for discharge home by Dr. Odette Fraction.   CONSULTATIONS:  1.  Hematology consultation by Dr. Ralene Ok, October 04, 2004, for evaluation      of pancytopenia.  This was felt most likely secondary to allopurinol and      6-mercaptopurine interaction with decreased metabolism and 6-MP causing      increased bone marrow toxicity.  2.  Pulmonary emboli of uncertain etiology.  He is also evaluated for this.  3.  GI consultation was also obtained, and the patient was seen in GI      consultation with Dr Fuller Plan on October 03, 2004, for followup evaluation of      his Crohn's.   PROCEDURES:  EGD and colonoscopy on October 07, 2004, with Dr.  Fuller Plan.    LABORATORY DATA:  On admission, October 02, 2004, white count is 2.7,  hemoglobin 8.1, hematocrit 22.7, platelets 67.  AT discharge, on March 12,  white count 1.4, hemoglobin 9.8, hematocrit 27.3, platelets 129.  Again, see  above.   HOSPITAL COURSE:  For other details.  On admission, PT 13.0, INR 1.0, PTT  29, D. dimer 3.17.  By time of discharge home on October 12, 2004, INR 2.9.  On admission, sodium 135, potassium 4.0, glucose 112, BUN 18, creatinine  1.9.  At discharge home, sodium 143, potassium 3.3.  And he was given  another 40 of potassium after that prior to discharge.  Glucose 90, BUN 7,  creatinine 1.6.  On October 02, 2004, LFT's were abnormal with an ALT of 43,  ALP 121, total bilirubin 2.3.  However, repeat on October 06, 2004, shows AST  normal 29, ALT normal 37, ALP normal 109, total bilirubin 1.6.  Magnesium  levels on March 8, was 1.4, and on March 12 was 1.4.  Hemoglobin A1C is 4.6,  cardiac enzymes were negative.  CKs of 26, 26, 27.  MB less than 0.3, less  than 0.3, less than 0.3.  Troponin 0.01 x3.  TSH normal at 1.925.  For all  of the coagulation studies, please see the computer.  It is quite lengthy.   RADIOLOGY:  Chest x-ray, October 03, 2004, shows persistent bibasilar aeration  disturbance with some improvement on the left.  They represent atelectasis,  although, infection cannot be excluded.  I do not actually have the printout  report of the CT scan that showed the pulmonary emboli.   Also note that the duplex scan on October 06, 2004, shows acute DVT throughout  the entire popliteal vein.   A 2-D echocardiogram on October 03, 2004, shows EF 55-65%.  Mild aortic  regurgitation.  Moderate aortic dilatation.  Mild mitral regurgitation.  Right ventricle moderately to markedly dilated.  Right ventricular systolic  function mildly reduced.  Estimated peak pulmonary artery systolic pressure  40 mmHg.   DISCHARGE MEDICATIONS: 1.  Asacol 400 mg three tablets three times a day.   2.  Nexium 40 mg daily.  3.  Coumadin 2.5 mg on Monday the 13th,  5 mg on Tuesday the 14th.  4.  Hytrin 5 mg at night.  5.  Paxil 40 mg daily.  6.  Trazodone 100 mg at night.  7.  Aranesp injection once a week.  8.  Colchicine 0.6 mg daily.  9.  Zantac, stop while on Nexium.  10. Valium 5 mg at night.  11. Lamictal and Lomotil as needed.  12. B12.  13. Fish oil capsules.   DISCHARGE INSTRUCTIONS:  Have blood drawn on Wednesday, March 15, to check a  state PT/INR, CBC, and BMET.  I  told him we would get the results by  Wednesday at 12 noon.   On Monday, call __________ to make an appointment to see Dr. Mathis Bud in 1-2  weeks.  On Monday, also, call Hematology [phone] to make and appointment to  see Dr. Ralene Ok in 1-2 weeks.   He has an appointment to see Dr. [name] March 22 at 10:30 a.m.      MBE/MEDQ  D:  10/15/2004  T:  10/15/2004  Job:  [id number]   cc:   [name], M.D. [facility]   Leslye Peer, MD  Fax: [phone]   Jeanie Cooks, M.D.  [employer] [place] [address]  Fax: [phone]   [name], M.D.  Fax: [phone]   Bonne Dolores, M.D.  [address], [place]  [place] [address]  Fax: [phone]

## 2010-12-19 NOTE — Consult Note (Signed)
Derek [name], Derek [name]            ACCOUNT NO.:  [id number]   MEDICAL RECORD NO.:  [id number]          PATIENT TYPE:  INP   LOCATION:  A320                          FACILITY:  [facility]   PHYSICIAN:  Gaston Islam. Tressie Stalker, MD  DATE OF BIRTH:  Apr 21, 1941   DATE OF CONSULTATION:  10/15/2004  DATE OF DISCHARGE:                                   CONSULTATION   DIAGNOSES:  1.  Cellulitis of the left forearm at the site of a venipuncture site used      for an IV.  2.  Recent diagnosis of pulmonary emboli.  3.  Longstanding history of Crohn's disease, status post two surgical      procedures many years ago.  4.  Recent knee operation approximately 2-1/2 years ago by Dr. Susa Day      for cartilage damage.  5.  History of gout for many years.  On Allopurinol as well as colchicine.  6.  Use of 6MP, 6-mercaptopurine for Crohn's disease for many years.  7.  Longstanding smoking history, although he quit years ago, but he smoked      as much as a pack to two packs of cigarettes a day for approximately 30      years.   HISTORY:  This is a pleasant 70 year old gentleman with a longstanding  history of Crohn's disease who was admitted to the hospital about 10 days  ago with chest pain.  Workup revealed bilateral pulmonary emboli secondary  to DVT of the left lower extremity, he states.  He was treated with heparin  as well as Coumadin.  He was found to have pancytopenia at that time, and  Dr. Laurey Morale saw him in consultation.  The 6MP and the Allopurinol, I  believe, were stopped.  Colchicine was continued.   He presented to Dr. Hulen Luster office complaining of pain at the left wrist.  He was started on some antibiotics.  He was subsequently seen on the 14th  with fever, and he declined hospitalization until yesterday.  One blood  culture done in his office came back showing gram-positive cocci.   He came in with a white count that was low at 1000, total white cells.  His  platelets were  119,000.  His hemoglobin was mildly low.   His PT was therapeutic at the time it was checked on admission, but  yesterday's value was 1.9 INR.  That was slightly low.   He was started on vancomycin IV antibiotics.   His lab work today shows that his white count is 2000, after a dose of  Neulasta, given yesterday.  Hemoglobin 10.9 gm.  Platelets were up to  142,000.  Differential showed still a mild left shift.  His PT was not  checked today.  His creatinine was still high at 1.9.  BUN 17.  His CO2 was  low at 14.  Potassium was 3.2, better than 2.7 on admission.   His urinalysis was unremarkable except for small amount of protein.   His vital signs while here have showed that he was 99.5 yesterday afternoon.  He was 100.2 degrees this  morning at noon.  His blood pressure was 100/70.  Respirations are actually 30-32, and they have varied since he has been here  to as high as 36 and as low as 20.  Pulse remains around 80-96.  His O2  saturations this afternoon were 97% on room air.  He states that his left  wrist is much better since being started on the antibiotics.  He is feeling  more short of breath than he was two week ago when he was in the [facility] system.   He has been a smoker, as I mentioned.  He has not been coughing up anything.  He has not had an exacerbation of his gout lately, since being off the  Allopurinol.  He has also used anti-inflammatories such as Indocin in the  past for this.   He had a chest x-ray this morning that showed mild atelectasis of the left  lower lung. There were also some subtle changes, according to Dr. Melanie Crazier, consistent with COPD.   He and his wife have been married many years.  They have two children.  He  was a [place] veteran.  Worked for many years also for [employer] but  eventually retired with post-traumatic stress disorder with a pension from  [employer] and some retirement from [employer].  He has not smoked for many   years.   REVIEW OF SYSTEMS:  He has not had any chest pain, per say, right now, but  he is short-winded.  His left leg does not bother him, which is where he  states he was found to have a clot.  He states that his bowels have been  working fairly well at times.   His medications have included his antibiotics, including vancomycin.  His  antibiotics also included ticarcillin at the time he was admitted for gram-  negative coverage, pending the results of the cultures.  I suspect Dr.  Caron Presume will stop the ticarcillin at this time.  He is also on Coumadin.  He has been on Imodium on a p.r.n. basis and Guaifenesin.  He is also on  Xanax on a p.r.n. basis.  He states that also, since having part of his  small bowel removed, has been on B12 shots twice a month for a number of  years.  He used to be on folic acid while taking the 6-mercaptopurine,  although that was stopped several months ago, he states.   PHYSICAL EXAMINATION:  VITAL SIGNS:  Febrile.  Respirations are about 30-32  times per minute and somewhat shallow.  GENERAL:  He appears to be alert and oriented.  SKIN:  Warm to the touch.  It is dry.  HEENT:  Throat is clear.  His left eye has been damaged in the past, and his  pupil does not respond to light at this time.  The right appeared to respond  to light normally.  Facial asymmetry appeared intact otherwise.  I should  also mention that the left pupil was dilated to about 4 mm, perhaps 5.  NECK:  He has no obvious adenopathy.  LUNGS:  Clear to auscultation.  There are no rubs anteriorly, posteriorly,  or laterally.  HEART:  Regular rate and rhythm without murmur or gallop.  ABDOMEN:  Soft and nontender without obvious hepatosplenomegaly.  Bowel  sounds were present.  He was in no acute distress really.  EXTREMITIES:  His left wrist is definitely slightly swollen and erythematous.  He moves his wrist quite  readily at this time at the joint.  His legs are without cyanosis or  edema.  The nails on the hands look  slightly cyanotic but his O2 sats at the completion of my exam were 97% on  room air.   This gentleman has pancytopenia, which is improving with the Neulasta, and I  will give him a shot of B12, which he has been taking in the past on a two-  times-per-month basis.  We will also restart the folic acid.  I have  reviewed his chest x-ray, but he is short-winded, and I have discussed his  case this afternoon with Dr. Caron Presume, who will look at  him.  We will put him on Xopenex nebulizer treatment.  We will get an ABG.  We will give him an incentive spirometer to use to reexpand that atelectatic  area.  If there is any question, I might as well repeat a CT, but we would  rather not do that in the face of a creatinine that is 1.9 at this time.      ESN/MEDQ  D:  10/15/2004  T:  10/15/2004  Job:  [id number]   cc:   Bonne Dolores, M.D.  [address] [place] [address]  Fax: [phone]   Jeanie Cooks, M.D.  [address] [place]  [place] [address]  Fax: [phone]

## 2010-12-19 NOTE — H&P (Signed)
[facility]  Patient:    Derek [name], Derek [name]              MRN: [id number] Adm. Date:  05/10/00 Attending:  Docia Chuck. Geri Seminole., M.D. [facility] Dictator:   Amy Esterwood, P.A.C.                         History and Physical  CHIEF COMPLAINT:  Severe watery diarrhea and weakness.  HISTORY:  Derek [name] is a pleasant 70 year old white male, known to Dr. [name]. [name], who has a history of Crohns disease.  He is status post ileocectomy in 1978 and has had several subsequent small-bowel obstructions, one of which required a laparotomy in 1978 or 1979.  He also has a history of an incarcerated hernia in 1999 and underwent repair of that with Dr. Rodman Key B. Hassell Done, and at the same time, laparoscopic cholecystectomy and lysis of adhesions.  He has a history of posttraumatic stress disorder -- he is a [place] veteran -- BPH, gout, hepatitis C.  He has been stable recently with a relatively quiescent Crohns disease on Asacol and 6-MP.  He was seen in the office last week and at that time was stable.  At this time, he had acute onset on Saturday, May 08, 2000, with severe watery diarrhea with up to 20 bowel movements per day, which has continued over the past three days. He is not having any melena but has seen some bright red blood on the tissue. No fever or chills.  No nausea or vomiting but has a poor appetite and says he has not eaten anything at all today, as it goes "straight through him."  He complains of burning abdominal discomfort with p.o. intake as well and rectal discomfort secondary to diarrhea.  No other family members have been ill. Patient did take a course of sulfa about six weeks ago for a urinary tract infection.  Patient came to the office without an appointment today, felt to be acutely ill, light-headed with standing and was brought to the emergency room for hydration and evaluation.  At this time, he is admitted for hydration, stool  cultures and symptom control and suspect he has an acute infectious colitis superimposed on his Crohns.  CURRENT MEDICATIONS 1. Colchicine 0.6 mg q.d. 2. Pepcid 20 mg q.d. 3. Asacol 400 mg two p.o. b.i.d. 4. Flomax one daily. 5. Valium 5 mg q.d. 6. Paxil 40 mg q.h.s. 7. Terazosin 5 mg q.d. 8. Trazodone 25 mg q.h.s. 9. 6-MP 50 mg per day, which was increased to 75 mg on May 07, 2000 but he    has not started that yet.  ALLERGIES:  Intolerant to steroids with personality changes.  PAST HISTORY:  As above; also with history of ureterolithiasis.  SOCIAL HISTORY:  The patient is married and says he is semiretired.  ______, he does chew tobacco and no alcohol.  FAMILY HISTORY:  Negative for GI disease.  REVIEW OF SYSTEMS:  CARDIOVASCULAR:  Negative for chest pain or anginal symptoms.  PULMONARY:  Negative for cough, shortness of breath, sputum production.  GENITOURINARY:  Negative for dysuria, urgency or frequency. MUSCULOSKELETAL:  Pertinent for bilateral calf discomfort with ambulation over the past week.  He says this eases off with rest.  He has not had any swelling, etc, and that the discomfort is equal bilaterally.  PHYSICAL EXAMINATION  GENERAL:  Well-developed white male in no acute distress.  VITAL SIGNS:  Temperature is 96.6,  blood pressure 117/76, pulse is 70, respirations 18.  HEENT:  Buccal mucosa is dry.  NECK:  There is no JVD or nodes.  CARDIOVASCULAR:  Regular rate and rhythm with S1 and S2.  PULMONARY:  Clear to A&P.  ABDOMEN:  Soft.  No focal abdominal tenderness.  No mass or hepatosplenomegaly.  He has multiple incisional scars.  Bowel sounds are active.  RECTAL:  Exam not done.  EXTREMITIES:  Without clubbing, cyanosis, or edema.  Pulses are intact.  There is no swelling or erythema of the calves.  LABORATORY STUDIES:  Laboratory studies are pending at this time.  IMPRESSION 1. [age]-year-old white male with Crohns ileocolitis, stable,  with    acute diarrheal illness with volume depletion; rule out infectious colitis    superimposed on Crohns; rule out Clostridium difficile. 2. Status post multiple abdominal surgeries including lysis of adhesions,    resection for small bowel obstruction, cholecystectomy, repair of    incarcerated hernia. 3. Ureterolithiasis. 4. Posttraumatic stress disorder. 5. Anxiety. 6. Hepatitis C. 7. Gout. 8. Benign prostatic hypertrophy. 9. Calf discomfort with ambulation, ? secondary to electrolyte disturbance,    rule out claudication, no evidence for deep venous thrombosis but cannot    rule out this either, although feel less likely.  PLAN:  Patient is admitted to the service of Dr. Docia Chuck. Geri Seminole.  He will be hydrated, place him on clear liquids and check abdominal films.  We will check a stool for C&S, O&P and C. difficile.  He will be placed on empiric Flagyl.  Will maintain his Asacol and 6-MP.  If his electrolytes are not significantly deranged, he may need a Doppler study of the lower extremities in the a.m.  For details, please see the orders. DD:  05/10/00 TD:  05/11/00 Job: [id number]

## 2010-12-19 NOTE — Op Note (Signed)
NAME:  Derek [name], Derek [name]                      ACCOUNT NO.:  [id number]   MEDICAL RECORD NO.:  [id number]                   PATIENT TYPE:  AMB   LOCATION:  DAY                                  FACILITY:  [facility]   PHYSICIAN:  Susa Day, M.D.                 DATE OF BIRTH:  [birth year]   DATE OF PROCEDURE:  01/23/2004  DATE OF DISCHARGE:                                 OPERATIVE REPORT   PREOPERATIVE DIAGNOSIS:  Tophaceous ganglion cyst on the right foot.   POSTOPERATIVE DIAGNOSIS:  Tophaceous deposit of the right foot, metatarsals  4 and 5.   OPERATION/PROCEDURE:  Excision of tophaceous gouty deposit of the right  foot.   ANESTHESIA:  General.   BRIEF HISTORY:  The patient is a 70 year old who has had an aspirate of a  mass of the right foot indicating a gelatinous material in concert with  tophaceous material. The patient has a history of gout that had recurred and  is quite painful for the patient for ambulation and shoe wear.  Operative  intervention was indicated for excision.  Risks and benefits discussed  including bleeding and infection. Recurrence of the cyst, etc.   DESCRIPTION OF PROCEDURE:  The patient in the supine position.  After  satisfactory general anesthesia, 1 g of Kefzol, the right lower extremity  was prepped and draped in the usual sterile fashion.  We made a linear  incision over the base of the fourth and fifth metatarsals.  Subcutaneous  tissue was dissected and electrocautery used to achieve hemostasis.  There  was a mass measuring 1 cm x 1 cm x 1.5 cm.  There was no clear margins.  Therefore, incised the capsule and extended down into the rays in between  the fourth and fifth metatarsal bases.  It was predominantly a tophaceous  gouty material.  Slight bloody material was noted.  No evidence of  infection.  This was incised and debrided and sent to pathology for  appropriate analysis.  Dissection was curetted and moved down to the bone.  We protected  the neurovascular structures at times.  The bone was curetted  as well at the base of the 5th on its medial side.  After removing the  tophaceous material and achieving hemostasis with electrocautery, copiously  irrigated with antibiotic irrigation.  I then repaired the capsule with a #1  Vicryl interrupted figure-of-eight suture after excising the redundant  portion of the capsule.  The mass had been completely removed.  I then  copiously irrigated and closed the skin with 4-0 nylon vertical mattress  sutures.  The wound was reinforced with Steri-Strips.  Sterile dressing  applied and secured with an Ace bandage.  The patient was then awakened  without difficulty and was transported to the recovery room.  The patient  tolerated the procedure well without complications.  Susa Day, M.D.    Geralynn Rile  D:  01/23/2004  T:  01/23/2004  Job:  [id number]

## 2010-12-19 NOTE — Op Note (Signed)
NAMEJAVIS, ABBOUD            ACCOUNT NO.:  [id number]   MEDICAL RECORD NO.:  [id number]          PATIENT TYPE:  AMB   LOCATION:  DAY                          FACILITY:  [facility]   PHYSICIAN:  Susa Day, M.D.    DATE OF BIRTH:  Nov 25, 1940   DATE OF PROCEDURE:  01/08/2006  DATE OF DISCHARGE:                                 OPERATIVE REPORT   PREOPERATIVE DIAGNOSES:  1.  Medial meniscal tear, left knee.  2.  Gout, left knee.  3.  Degenerative joint disease.   POSTOPERATIVE DIAGNOSES:  1.  Medial meniscal tear, left knee.  2.  Gout, left knee.  3.  Degenerative joint disease.   PROCEDURE PERFORMED:  Left knee arthroscopy, posterior medial meniscectomy,  debridement of a patellofemoral joint medial compartment, lateral  compartment.   ANESTHESIA:  General.   BRIEF HISTORY/INDICATIONS:  A 70 year old with osteoarthritic pain of the  left knee, gout, medial meniscal tear by MRI, degenerative changes.  Operative intervention was indicated for debridement and posterior medial  meniscectomy.  The risks and benefits have been discussed, including  bleeding, infection, damage to vascular structures, no change in symptoms,  worsening symptoms, need for repeat debridement or total knee arthroplasty  in the future.   TECHNIQUE:  With the patient in supine position after the induction of  adequate anesthesia and 500 mg vancomycin, the left lower extremity was  prepped and draped in the usual sterile fashion.  A lateral parapatellar  portal and superior medial parapatellar portal was fashioned with a #11  blade.  Ingress cannula atraumatically placed.  Irrigant was utilized to  insufflate the joint.  Under direct visualization, a medial parapatellar  portal was fashioned with a #11 blade after localization with an 18 gauge  needle sparing the medial meniscal.  There initially was extensive gouty  deposits in all three compartments.  The medial meniscus showed a posterior  horn tear  with a torn posterior medial meniscectomy that was stable based.  Further contoured with a 4.2 coude shaver.  Additionally, there was  posterolateral tearing of the lateral meniscus.  This was shaved as well.  I  debrided the femoral condyles bilaterally and tibial plateaus bilaterally.  The meniscus and the sulcus and the patellofemoral joint to free as much of  the gouty deposits as possible and __________ the loose cartilaginous debris  as well as the loose gouty deposits.  Gutters were lavaged as well.  ACL and  PCL showed degenerative changes and deposits as well.  There was good  patellofemoral tracking.  The remainder of the menisci were stable to probe  palpation without any evidence of displacement at the joint.  Again, the  knee was copiously lavaged.  All instrumentation was removed.  Portals were  closed with 4-0 nylon and simple sutures.  Marcaine 0.25% with epinephrine  was infiltrated into the joint.  The  wound was dressed sterilely and secured with an Ace bandage.  The patient  was extubated without difficulty and transported to the recovery room in  satisfactory condition.  The patient tolerated the procedure well with no  complications.  Susa Day, M.D.  Electronically Signed     JB/MEDQ  D:  01/08/2006  T:  01/08/2006  Job:  [id number]

## 2010-12-19 NOTE — Discharge Summary (Signed)
Derek [name], Derek [name]            ACCOUNT NO.:  [id number]   MEDICAL RECORD NO.:  [id number]          PATIENT TYPE:  INP   LOCATION:  A320                          FACILITY:  [facility]   PHYSICIAN:  Bonne Dolores, M.D.    DATE OF BIRTH:  January 23, 1941   DATE OF ADMISSION:  10/14/2004  DATE OF DISCHARGE:  03/20/2006LH                                 DISCHARGE SUMMARY   DISCHARGE DIAGNOSES:  1.  Gram-positive sepsis.  Culture nonspecific.  2.  Recent pulmonary embolism.  Subtherapeutic INR on presentation.  No      evidence of recurrence.  3.  Pancytopenia, most likely Indocin/allopurinol induced, resolving with      time and Neulasta therapy.  4.  Post traumatic stress syndrome.  5.  Metabolic acidosis of questionable etiology, resolving.  6.  History of gout.  7.  History of nephrolithiasis.  8.  Status post cholecystectomy in the remote past.   For details regarding admission, please refer to the admission note.  Briefly, this 70 year old male with a complicated history as noted above had  been discharged from [facility] approximately three days prior to this  admission.  He presented to the office on March 12 and complained of pain  and fever at the IV site, left wrist.  He had cellulitis and was febrile  though nontoxic at the time.  A CB was obtained, as well as blood cultures.  CBC revealed a white count of 21,000 with 85% segs, a hemoglobin of 12 and  platelet count of 150,000.  He was treated with Claforan, as well as Keflex  and instructed to return the following morning.  The day of admission, he  returned and was found to be febrile.  Another dose of Claforan was  administered.  Hospitalization was refused.  Soon after he left the office,  his blood culture from the previous day was reported to be positive for gram-  positive cocci in clusters.  He was caught and actually told to come back to  the hospital.   The patient was admitted with gram-positive sepsis which is very  possibly  hospital acquired, apparently from an infected IV site.   HOSPITAL COURSE:  The patient was treated with vancomycin.  Repeat blood  cultures revealed no growth.  His pro time was slightly depressed and  heparin was begun and Coumadin dose increased.  He clinically improved over  a period of time.  He developed a metabolic acidosis with bicarbonate of  approximately 13.  He had appropriate respiratory compensation.  Dr.  Lowanda Foster was consulted who felt it may be from GI blood loss.  This  corrected spontaneously.  He was given bicarb briefly.  Dr. Tressie Stalker was  also consulted who felt that his pancytopenia was from Indocin and  allopurinol, and was improving.  No further intervention was entertained at  that time.   The patient continued to improve on antibiotics.  Repeat blood cultures were  negative.  He defervesced and did very well and was stable for discharge on  the sixth hospital day.   DISCHARGE MEDICATIONS:  1.  Clindamycin 300  mg q.6h.  2.  Levaquin 500 mg daily x7 days.  He will be continued on his prehospitalization medications which include  Coumadin at 5 mg alternating with 2.5 mg, colchicine 0.6 b.i.d., trazodone  100 daily, terazosin 5 mg at bedtime, Paxil 40 mg daily, B12 every month,  Asacol 400 mg t.i.d. and Nexium 40 mg daily.  He will be followed and  treated expectantly as an outpatient.      MC/MEDQ  D:  11/07/2004  T:  11/07/2004  Job:  [id number]

## 2010-12-19 NOTE — Op Note (Signed)
NAMEJASON, Derek [name]            ACCOUNT NO.:  [id number]   MEDICAL RECORD NO.:  [id number]          PATIENT TYPE:  AMB   LOCATION:  [facility]                         FACILITY:  [facility]   PHYSICIAN:  Synthia Innocent, M.D.  DATE OF BIRTH:  May 27, 1941   DATE OF PROCEDURE:  07/09/2005  DATE OF DISCHARGE:                                 OPERATIVE REPORT   PREOPERATIVE DIAGNOSES:  1.  A 5 mm distal left ureteral calculus with renal colic.  2.  History of nephrolithiasis.   POSTOPERATIVE DIAGNOSES:  1.  A 5 mm distal left ureteral calculus with renal colic.  2.  History of nephrolithiasis.   OPERATIONS:  Cystoscopy and rigid ureteroscopy left ureteral stone with  attempted holmium laser lithotripsy stone and then attempted stone basket  and insertion of a 6-French 26 cm double-J stent.   SURGEON:  Synthia Innocent, M.D.   DESCRIPTION OF PROCEDURE:  The patient was prepped and draped in the dorsal  lithotomy position under general LMA anesthesia. Cystoscopy was performed  with a 22-French rigid cystoscope. He had a normal anterior urethra. He had  a rather elevated posterior lip but a small prostate. The bladder itself was  grossly normal. No tumors, no calculi, normal ureteral orifices.   Initially through a 6-French open-ended ureteral catheter, I passed up a  0.038 sensor dual flex guidewire. This went up using fluoroscopic control. I  then backed out the open-ended catheter and also backed out the cystoscope  and then under direct vision I passed a 6.5 [place] short rigid ureteroscope.  Under direct vision, this was passed up into the distal ureter without much  difficulty. We did engage the stone but it was rather a small stone and it  kept moving about. It was hard to trap. Initially I tried to laser the stone  once I had it trapped against the guidewire at 2.5 watts. However, the stone  kept bouncing around and also proved to be a rather hard stone so then I  converted to a nitinol  stone basket, but during this maneuver the stone had  gone back up the ureter. At this point, it was hard for me to get over the  iliac vessels so I thought I would put in a double-J stent at this point.  Therefore I backed out the rigid ureteroscope, back loaded the guidewire  through a cystoscope and then passed up a 6-French 26 cm double-J stent  again using fluoroscopic control. The distal end was curled up in the  bladder. The bladder was emptied, all instruments were removed.   A B&O suppository was placed for anesthetic purposes as well as 10 mL  of  Xylocaine jelly for anesthetic purposes. Appropriate pictures were taken and  the patient tolerated the procedure well.     Synthia Innocent, M.D.  Electronically Signed    RFS/MEDQ  D:  07/09/2005  T:  07/09/2005  Job:  [id number]

## 2010-12-19 NOTE — Discharge Summary (Signed)
NAMELEVIS, NAZIR            ACCOUNT NO.:  [id number]   MEDICAL RECORD NO.:  [id number]          PATIENT TYPE:  INP   LOCATION:  1508                         FACILITY:  [facility]   PHYSICIAN:  [name], MD,FACGDATE OF BIRTH:  May 05, 1941   DATE OF ADMISSION:  12/27/2008  DATE OF DISCHARGE:  12/30/2008                               DISCHARGE SUMMARY   DISPOSITION:  Home in stable condition.   DISCHARGE MEDICATIONS:  The patient will continue his home medications  that is including:  1. Terazosin 2 mg 1 tablet at bedtime.  2. Potassium 10 mEq 1 tablet twice daily.  3. Aspirin 81 mg daily.  4. Trazodone 150 mg at bedtime.  5. Atenolol 25 mg once daily.  6. Colchicine 0.6 mg daily.  7. Gabapentin 100 mg twice daily.  8. Paxil 40 mg at bedtime.  9. Mag oxide 400 mg twice daily.  10.Allopurinol 100 mg 2 tablets daily.  11.Tums 500 mg 2 tablets twice a day.  12.Omeprazole 20 mg once daily.  13.Humira was put on hold.  14.The patient was started on Entocort 3 mg 3 tablets daily.   CONSULTATIONS:  None requested this admission.   PROCEDURES:  None performed this admission.   ADMITTING DIAGNOSES:  1. Small-bowel obstruction based on abdominal distention, pain.  2. History of Crohn disease with similar problems in the past.  3. Chronic kidney disease.  4. Gout.  5. History of pulmonary embolism.   HOSPITAL COURSE:  Mr. Fok called Dr. Silvano Rusk, who was on-call  for our practice, the evening of Dec 27, 2008.  It was actually the  patient's wife that called and reported the patient was having  increasing abdominal distention, predominantly upper abdomen.  He was  having pain similar to his prior bowel obstruction in February 2010.  The patient had recently started Humira and was reportedly doing well.  On the evening of admission, the patient began having loose bowel  movements, progressive abdominal distention with increasing pressure and  pain.  He had some  nausea but no vomiting.  Dr. Carlean Purl advised Mr.  Dupre to go to the hospital for admission.  He was started on IV  fluids, and given p.r.n. antiemetics and pain medication.  For suspected  bowel obstruction, the patient was started on Solu-Medrol in case the  obstruction was secondary to active Crohn disease.  On admission, the  patient's white count was normal.  Hemoglobin 14.5, hematocrit 43,  platelets were low at 111.  The patient's BUN was 18, creatinine 1.90.  LFTs showed total bilirubin of 1.3, alkaline phosphatase 119,  transaminases were normal.  Surprisingly, an amylase and lipase were  drawn and were elevated at 709 and 695, respectively.  Acute abdominal  series showed fluid-filled small bowel loops with a few scattered air-  fluid levels, questionable ileus versus small bowel obstruction.  On  admission, an NG-tube was placed, but by the following day because of an  insignificant amount of output, the NG-tube was clamped and then  discontinued.  By the second day of admission, the patient was feeling  better.  It was  felt that abdominal distention was likely secondary to  any ileus related to pancreatitis, however.  The etiology of the  pancreatitis was not immediately clear.  Triglycerides were only 240.  The patient had a remote history of cholecystectomy.  Ultrasound of the  abdomen showed common bile duct to be only 5.6 mm.  As a side note, the  spleen was prominent at 9 cm.  CT of the abdomen was performed without  IV contrast secondary to chronic kidney disease.  Stranding around the  tail of the pancreas extending into the hilum of the spleen was seen and  compatible with pancreatitis.  No fluid collections were visualized.  There was also wall thickening of the descending colon.  By Dec 30, 2008, the patient was tolerating full liquids.  Pain had resolved.  The  patient was started on 9 mg of Entocort daily and Humira was put on hold  for the time being.  Mr.  Gilliand was discharged home in stable  condition with plans to see Dr. [name] in the office.  On the day of  discharge, his BUN was 14, creatinine 1.76.  Total bilirubin 1.8,  alkaline phosphatase 121, AST 38, ALT 63.  Albumin 3.1.  Amylase was  down to 110 and lipase at 32.   DISCHARGE DIAGNOSES:  1. Resolving acute pancreatitis, possibly secondary to Humira started      2 months ago.  2. Ileus, probably secondary to acute pancreatitis, resolved.  3. Crohn disease.  4. Chronic kidney disease, stable.  5. History of pulmonary embolism.  6. Gout.      Tye Savoy, NP      [name], MD,FACG  Electronically Signed    PG/MEDQ  D:  01/15/2009  T:  01/16/2009  Job:  [id number]

## 2010-12-19 NOTE — Assessment & Plan Note (Signed)
[name]   TRACKER, MANCE                   MRN:          [id number]  DATE:07/21/2006                            DOB:          1940/09/28    [name] is a 70 year old gentleman with Crohn's disease status  post terminal ileal resection in 1978.  Last colonoscopy in March 2006  showed internal hemorrhoids and erosions and friability of the ileocolic  anastomosis as described by Dr. Fuller Plan.  He has been on 6-mercaptopurine  50 mg a day and has been asymptomatic.  He has a history of B-12  deficiency for which he takes B-12 supplements twice a month.  Patient  also has a history of hepatitis C but his hepatitis C RNA by PCR has  been consistently negative on measure the last time in March 2007.  His  hemoglobin was normal except for low platelet count attributed to 6-MP.   MEDICATIONS:  1. Trazodone 100 mg q.d. for posttraumatic stress syndrome.  2. Terazosin 5 mg p.o. q.d.  3. 6-MP 50 mg p.o. q.d.  4. Potassium supplement 20 mEq b.i.d.  5. Nexium 20 mg p.o. q.d.  6. Paxil 30 mg q.p.m.  7. Colchicine 0.6 mg 2 q.d.  8. Fish.  9. Oil vitamins.  10.Coumadin 5 mg a day since arthroscopy of the right knee.  He will      continue Coumadin through this month.  11.Neurontin 100 mg p.o. b.i.d.   PHYSICAL EXAMINATION:  Blood pressure 102/64, pulse 16, weight 201  pounds which is 10 pounds less than the last visit 6 months ago.  He was  alert and oriented in no distress.  LUNGS:  Clear to auscultation.  COR:  Normal S1, normal S2.  ABDOMEN:  Soft, nontender with normoactive bowel sounds, well-healed  surgical scars in the midline, no fullness, no tenderness in right lower  quadrant.  EXTREMITIES:  No edema.   IMPRESSION:  A 70 year old white male with Crohn's disease of the  ileocolic anastomosis as evidenced by lesion at colonoscopy in March  2006.  Patient remains asymptomatic.  His  diarrhea may be due to  colchicine which he takes for synovial gout or possibly due to bacteria  overgrowth.  There is no evidence of obstructive symptoms.   PLAN:  1. Next colonoscopy 2011.  2. Continue 6-mercaptopurine 50 mg a day.  3. Blood test every 6 months, patient gets them at the [facility] hospital in      [place]. [name], MD  Electronically Signed    DMB/MedQ  DD: 07/21/2006  DT: 07/21/2006  Job #: [id number]   cc:   Bonne Dolores, M.D.

## 2010-12-19 NOTE — Assessment & Plan Note (Signed)
[facility] OFFICE NOTE   [name], Derek                   MRN:          [id number]  DATE:05/28/2006                            DOB:          Jan 15, 1941    REASON FOR OFFICE VISIT:  Scheduled six-month followup.   [name] is a 70 year old male, with history of asymptomatic thoracic  aortic aneurysm, with no known coronary artery disease, and history of  bilateral pulmonary embolic/DVT.  He was last seen here in the clinic in  April 2007, by Dr. Dannielle Burn, and now presents following a scheduled six-month  followup 2D echocardiogram.   The repeat echocardiogram shows a stable aortic root with moderate aortic  root dilatation, mild aortic arch dilatation, and mild aortic valvular  regurgitation.  Left ventricular function remains normal with no wall motion  abnormalities.   These results were reviewed by Dr. Dannielle Burn with recommendation to follow up  with an MRI of the chest for further clarification.   From a clinical standpoint, Derek [name] presents with no interim  development of chest pain, exertional dyspnea, presyncope/syncope, or tachy  palpitations.   The patient is awaiting clearance to proceed with some minor right foot  surgery, for which he needs to remain hospitalized overnight.  It will be  performed under general anesthesia.   CURRENT MEDICATIONS:  1. Paxil 40 mg q.d.  2. Potassium 20 mEq b.i.d.  3. Trazodone 100 mg q.d.  4. Mercaptopurine 50 mg q.d.  5. Colchicine 0.6 mg q.d.  6. Omeprazole 20 mg q.d.  7. Fish oil q.d.  8. Vitamin B12 injection q. monthly.  9. Atenolol 25 mg q.d.  10.Terazosin 5 mg q.h.s.  11.Gabapentin 100 mg b.i.d.   PHYSICAL EXAM:  VITAL SIGNS:  Blood pressure 112/78.  Pulse 60, regular.  Weight 200.  GENERAL:  [age]-year-old male in no apparent distress.  NECK:  Palpable carotid pulses without bruits.  LUNGS:  Clear to auscultation in all  fields.  HEART:  Regular rate and rhythm (S1, S2).  There is no audible diastolic  blow across the upper sternal border.  ABDOMEN:  Soft, nontender.  Intact bowel sounds.  EXTREMITIES:  Palpable pulses.  There is no significant edema.  NEURO:  No focal deficits.   IMPRESSION:  1. Thoracic aortic aneurysm.      a.     Stable by repeat echocardiogram, May 2007.      b.     Mild aortic regurgitation.      c.     Preserved left ventricular function.  2. History of bilateral pulmonary emboli/DVT.  3. Recurrent gout.  4. Crohn's disease.      a.     Followed by Dr. [name].   PLAN:  I reviewed with Dr. Dannielle Burn, Derek [name] most recent  echocardiogram.  The plan is to proceed with an MRI of the chest for  followup and further clarification.  We will schedule this today and have  the patient return in six months for continued monitoring of his thoracic  aortic aneurysm.  No further cardiac workup is indicated at  this time.  The  patient has no known history of coronary artery disease and has had a  previous negative workup for ischemic heart disease with negative stress  Cardiolite.  Therefore, he is cleared to proceed with right foot surgery as  planned.     ______________________________  Mannie Stabile, PA-C    ______________________________  Ernestine Mcmurray, MD,FACC   GS/MedQ  DD: 05/28/2006  DT: 05/30/2006  Job #: [id number]

## 2010-12-25 NOTE — Discharge Summary (Signed)
NAMEELSIE, SAKUMA            ACCOUNT NO.:  [id number]  MEDICAL RECORD NO.:  [id number]           PATIENT TYPE:  I  LOCATION:  8099                         FACILITY:  [facility]  PHYSICIAN:  Rithik Odea I Mailey Landstrom, MD      DATE OF BIRTH:  1940/08/13  DATE OF ADMISSION:  12/08/2010 DATE OF DISCHARGE:  12/12/2010                              DISCHARGE SUMMARY   DISCHARGE DIAGNOSES: 1. Partial small bowel obstruction, resolved. 2. Leukopenia, thrombocytopenia. 3. History of pancytopenia in the past, related to allopurinol, 6-MP.     Currently, we felt it could be related to history of hepatitis C. 4. The patient would repeat his CBC on May 15th and further followup     will be addressed as an outpatient. 5. History of deep venous thrombosis and pulmonary embolism, not on     any medication currently. 6. History of hepatitis C. 7. History of coronary artery disease. 8. History of chronic renal insufficiency with baseline creatinine     around 1.6. 9. History of incarcerated hernia.  DISCHARGE MEDICATIONS: 1. Colchicine 0.3 p.o. daily. 2. Atenolol 25 mg daily. 3. Vitamin B12, 1000 mcg twice per month. 4. Potassium bicarb and citric acid 10 mEq 2 tablets twice daily. 5. Fish oil. 6. Neurontin 100 mg twice daily. 7. Hydrocodone/APAP 4 times daily. 8. Magnesium oxide 1 tablet twice daily. 9. Multivitamin 1 tablet daily. 10.Omeprazole 20 mg p.o. daily. 11.Paxil 40 mg daily. 12.Terazosin 2 mg daily. 13.Trazodone 150 mg at bedtime. 14.Uloric 80 mg daily.  CONSULTATION:  Surgery consulted and Gastroenterology consulted.  PROCEDURE: 1. Abdominal x-ray.  No evidence of active pulmonary disease, small     bowel obstruction. 2. CT abdomen and pelvis.  Small bowel obstruction with transition     point within the right lower quadrant.  Obstruction appeared     partial in that there is moderate-volume fluid and stool within the     colon. 3. Abdominal x-ray.  Progression of oral contrast  into the colon.  No     air fluid level of the small bowel obstruction seen.  HISTORY OF PRESENT ILLNESS:  This is a 70 year old male with a history of Crohn disease and history of several different abdominal surgeries. The patient followup with [facility], has multiple admissions for small bowel obstruction and chronic ileitis, admitted to the hospital with abdominal pain, had an x-ray performed and showed no active evidence of acute pulmonary process, but did show evidence of small bowel obstruction. 1. Partial small bowel obstruction, likely secondary to adhesions     versus Crohn's.  Gastroenterology and surgical team consulted and     patient treated conservatively with keeping the patient NPO and     bowel rest and IV fluids.  Repeat CT abdomen and pelvis did show     transition area and another repeat x-ray did show passing of the     oral contrast into the colon.  The patient has 2-3 bowel movements     and he feels much better at this time. 2. Leukopenia.  The patient noticed he has dropping on his white blood cells to 2.9.  The patient and wife informed.  This could be     secondary to history of hepatitis C or medications.  Colchicine     dose was decreased to 0.3 mg p.o. daily.  Also, Bactrim was     discontinued, but the patient was not on Bactrim, here, Bactrim     dose will be discontinued at the time being and will be started if     white blood cells improve.  The patient will repeat CBC in the next     week if persistently low, Hematology consult will be arranged as     outpatient. 3. Chronic kidney disease, at baseline. 4. Thrombocytopenia.  Per discussion with the wife, the patient at     baseline with platelet around 67. 5. History of gout.  No active gout exacerbation.  We will decrease     the dose of colchicine. 6. History of pulmonary embolism and deep venous thrombosis in the     past, not on any medication at this time.  Currently, the patient  will be discharged with healthy-heart diet. Activity as tolerated.  The patient will follow up with his physician on May 15th with another CBC with differential.  The patient will follow up with [facility].     Adria Costley Franco Collet, MD     HIE/MEDQ  D:  12/12/2010  T:  12/13/2010  Job:  [id number]  Electronically Signed by Donia Ast MD on 12/25/2010 09:44:54 AM

## 2011-01-11 NOTE — H&P (Signed)
Derek [name], Derek [name] NO.:  [id number]  MEDICAL RECORD NO.:  [id number]           PATIENT TYPE:  E  LOCATION:  WLED                         FACILITY:  [facility]  PHYSICIAN:  Arlyss Repress, MD        DATE OF BIRTH:  Feb 27, 1941  DATE OF ADMISSION:  12/08/2010 DATE OF DISCHARGE:                             HISTORY & PHYSICAL   CHIEF COMPLAINT:  Small bowel obstruction.  HISTORY OF PRESENT ILLNESS:  70 year old male with Crohn disease, history of several different abdominal surgeries, apparently complains of terrible epigastric sharp pain starting today about 4:00 p.m..  The patient denies any fever, chills, nausea, vomiting, diarrhea, constipation, bright red blood rectum or black stool.  The patient present to the ED because this was similar to his prior small-bowel obstruction discomfort.  An abdominal x-ray was performed and showed no active evidence of acute pulmonary disease.  Changes were consistent with small-bowel obstruction.  White count was not elevated. Bicarbonate was within normal limits.  Liver function was slightly abnormal.  The patient will be admitted for partial small bowel obstruction. In the past this is resolved with bowel rest. I discussed with Dr. Fanny Skates who recommended to admit the patient with no acute stressor in the morning.  PAST MEDICAL HISTORY: 1. Small bowel obstruction. 2. History of Crohn disease. 3. Chronic kidney disease or nephrolithiasis. 4. Gout. 5. History of pulmonary embolism. 6. DVT in 2006. 7. Benign prostatic hypertrophy. 8. Incarcerated hernia. 9. Pancytopenia related to allopurinol. 10.Heart disease. 11.Hepatitis C.  PAST SURGICAL HISTORY: 1. Colon surgery x2 in 1979. 2. Appendectomy. 3. Cholecystectomy. 4. Hernia surgery.  SOCIAL HISTORY:  The patient is a [place] veteran.  He lives with spouse. He does not smoke or drink at present time.  He does attend the [employer].  ALLERGIES:   ALLOPURINOL. ATIVAN.  MEDICATIONS:  Please see med rec.  REVIEW OF SYSTEMS:  Negative for all 10 organ systems, except for pertinent positives stated above.  PHYSICAL EXAM:  VITAL SIGNS:  Temperature 97.3, pulse 51, blood pressure 146/74, pulse ox 100% on room air.  HEENT: Anicteric, EOMI, no nystagmus, pupils 1.5 mm, symmetric, direct consensual near reflexes intact.  Mucous membranes moist. NECK: No JVD, no bruit, no thyromegaly, no adenopathy. HEART:  Regular rate and rhythm.  S1, S2 no murmurs, gallops or rubs. LUNGS: Clear to auscultation bilaterally. ABDOMEN: Soft, nontender, nondistended.  Positive bowel sounds. EXTREMITIES: No cyanosis, clubbing, or edema.  LABORATORY DATA:  Sodium 137, potassium 4.0, BUN 22, creatinine 1.95, AST 41, ALT 37, alk phosphatase 138, total bilirubin 2.8, WBC 6.2, hemoglobin 15.7, and platelet count 108.  ASSESSMENT/PLAN: 1. Small-bowel obstruction.  NG tube to low intermittent suction     n.p.o., surgery consult in a.m. as per Dr. Dalbert Batman suggestion.  GI     will come by and evaluate the patient morning as well.  We     appreciate their input. 2. Pain control with Dilaudid 1 mg IV q.4 h p.r.n. pain. 3. Gout.  Continue Colcrys and Uloric. 4. Hypertension.  Continue atenolol and trazodone. 5. Neuropathy.  Continue gabapentin. 6. GERD:  Continue Protonix.  7. DVT prophylaxis, Lovenox.     Arlyss Repress, MD     JYK/MEDQ  D:  12/09/2010  T:  12/09/2010  Job:  [id number]  cc:   [name]. [name], [facility] [address] [place] [address]  [name], M.D. Fax: [phone]  Electronically Signed by Jani Gravel MD on 01/11/2011 06:57:13 PM

## 2011-04-29 ENCOUNTER — Encounter: Payer: Self-pay | Admitting: Cardiology

## 2011-04-30 ENCOUNTER — Ambulatory Visit: Payer: Medicare Other | Admitting: Cardiology

## 2011-05-01 ENCOUNTER — Encounter: Payer: Self-pay | Admitting: Cardiology

## 2011-05-01 ENCOUNTER — Ambulatory Visit: Payer: Medicare Other | Admitting: Cardiology

## 2011-05-08 LAB — POCT I-STAT 4, (NA,K, GLUC, HGB,HCT)
HCT: 41 % (ref 39.0–52.0)
Hemoglobin: 13.9 g/dL (ref 13.0–17.0)
Potassium: 4 mEq/L (ref 3.5–5.1)
Sodium: 141 mEq/L (ref 135–145)

## 2011-05-19 LAB — I-STAT 8, (EC8 V) (CONVERTED LAB)
Chloride: 111
Glucose, Bld: 99
HCT: 41
Hemoglobin: 13.9
TCO2: 24
pCO2, Ven: 48.8
pH, Ven: 7.271

## 2011-07-01 ENCOUNTER — Ambulatory Visit: Payer: Medicare Other | Admitting: Cardiology

## 2011-08-10 ENCOUNTER — Ambulatory Visit: Payer: Medicare Other | Admitting: Cardiology

## 2011-08-21 ENCOUNTER — Encounter (HOSPITAL_COMMUNITY): Payer: Self-pay

## 2011-08-21 ENCOUNTER — Emergency Department (HOSPITAL_COMMUNITY): Payer: Medicare Other

## 2011-08-21 ENCOUNTER — Inpatient Hospital Stay (HOSPITAL_COMMUNITY)
Admission: EM | Admit: 2011-08-21 | Discharge: 2011-08-25 | DRG: 389 | Disposition: A | Discharge status: Home / Self Care | Payer: Medicare Other | Attending: Internal Medicine | Admitting: Internal Medicine

## 2011-08-21 DIAGNOSIS — K5000 Crohn's disease of small intestine without complications: Secondary | ICD-10-CM | POA: Diagnosis not present

## 2011-08-21 DIAGNOSIS — M109 Gout, unspecified: Secondary | ICD-10-CM | POA: Diagnosis present

## 2011-08-21 DIAGNOSIS — K5669 Other intestinal obstruction: Principal | ICD-10-CM | POA: Diagnosis present

## 2011-08-21 DIAGNOSIS — K5090 Crohn's disease, unspecified, without complications: Secondary | ICD-10-CM

## 2011-08-21 DIAGNOSIS — Z9049 Acquired absence of other specified parts of digestive tract: Secondary | ICD-10-CM | POA: Diagnosis not present

## 2011-08-21 DIAGNOSIS — N179 Acute kidney failure, unspecified: Secondary | ICD-10-CM | POA: Diagnosis present

## 2011-08-21 DIAGNOSIS — Z86718 Personal history of other venous thrombosis and embolism: Secondary | ICD-10-CM

## 2011-08-21 DIAGNOSIS — N183 Chronic kidney disease, stage 3 unspecified: Secondary | ICD-10-CM | POA: Diagnosis present

## 2011-08-21 DIAGNOSIS — N189 Chronic kidney disease, unspecified: Secondary | ICD-10-CM | POA: Diagnosis not present

## 2011-08-21 DIAGNOSIS — I129 Hypertensive chronic kidney disease with stage 1 through stage 4 chronic kidney disease, or unspecified chronic kidney disease: Secondary | ICD-10-CM | POA: Diagnosis present

## 2011-08-21 DIAGNOSIS — K56609 Unspecified intestinal obstruction, unspecified as to partial versus complete obstruction: Secondary | ICD-10-CM | POA: Diagnosis not present

## 2011-08-21 DIAGNOSIS — I2510 Atherosclerotic heart disease of native coronary artery without angina pectoris: Secondary | ICD-10-CM | POA: Diagnosis present

## 2011-08-21 DIAGNOSIS — Z09 Encounter for follow-up examination after completed treatment for conditions other than malignant neoplasm: Secondary | ICD-10-CM | POA: Diagnosis not present

## 2011-08-21 DIAGNOSIS — K5080 Crohn's disease of both small and large intestine without complications: Secondary | ICD-10-CM | POA: Diagnosis present

## 2011-08-21 DIAGNOSIS — F411 Generalized anxiety disorder: Secondary | ICD-10-CM | POA: Diagnosis present

## 2011-08-21 DIAGNOSIS — J984 Other disorders of lung: Secondary | ICD-10-CM | POA: Diagnosis not present

## 2011-08-21 DIAGNOSIS — N184 Chronic kidney disease, stage 4 (severe): Secondary | ICD-10-CM | POA: Diagnosis present

## 2011-08-21 DIAGNOSIS — R109 Unspecified abdominal pain: Secondary | ICD-10-CM | POA: Diagnosis not present

## 2011-08-21 DIAGNOSIS — I2782 Chronic pulmonary embolism: Secondary | ICD-10-CM | POA: Diagnosis present

## 2011-08-21 DIAGNOSIS — Z7901 Long term (current) use of anticoagulants: Secondary | ICD-10-CM | POA: Diagnosis not present

## 2011-08-21 DIAGNOSIS — N1832 Chronic kidney disease, stage 3b: Secondary | ICD-10-CM | POA: Diagnosis present

## 2011-08-21 DIAGNOSIS — D696 Thrombocytopenia, unspecified: Secondary | ICD-10-CM | POA: Diagnosis present

## 2011-08-21 DIAGNOSIS — R1110 Vomiting, unspecified: Secondary | ICD-10-CM | POA: Diagnosis not present

## 2011-08-21 DIAGNOSIS — F419 Anxiety disorder, unspecified: Secondary | ICD-10-CM | POA: Diagnosis present

## 2011-08-21 DIAGNOSIS — D61818 Other pancytopenia: Secondary | ICD-10-CM | POA: Diagnosis not present

## 2011-08-21 HISTORY — DX: Rosacea, unspecified: L71.9

## 2011-08-21 HISTORY — DX: Chronic kidney disease, stage 3 (moderate): N18.3

## 2011-08-21 HISTORY — DX: Acute pancreatitis without necrosis or infection, unspecified: K85.90

## 2011-08-21 HISTORY — DX: Post-traumatic stress disorder, unspecified: F43.10

## 2011-08-21 LAB — BASIC METABOLIC PANEL
GFR calc Af Amer: 39 mL/min — ABNORMAL LOW (ref >90)
GFR calc non Af Amer: 34 mL/min — ABNORMAL LOW (ref >90)
Potassium: 4.1 mEq/L (ref 3.5–5.1)
Sodium: 139 mEq/L (ref 135–145)

## 2011-08-21 LAB — CBC
MCH: 29.7 pg (ref 26.0–34.0)
MCHC: 35.1 g/dL (ref 30.0–36.0)
Platelets: 114 10*3/uL — ABNORMAL LOW (ref 150–400)
RBC: 5.19 MIL/uL (ref 4.22–5.81)

## 2011-08-21 LAB — DIFFERENTIAL
Basophils Relative: 0 % (ref 0–1)
Eosinophils Absolute: 0.1 10*3/uL (ref 0.0–0.7)
Lymphs Abs: 0.9 10*3/uL (ref 0.7–4.0)
Neutrophils Relative %: 77 % (ref 43–77)

## 2011-08-21 MED ORDER — MORPHINE SULFATE 4 MG/ML IJ SOLN
4.0000 mg | Freq: Once | INTRAMUSCULAR | Status: AC
  Administered 2011-08-21: 4 mg via INTRAVENOUS
  Filled 2011-08-21: qty 1

## 2011-08-21 MED ORDER — HYDROMORPHONE HCL PF 2 MG/ML IJ SOLN
2.0000 mg | INTRAMUSCULAR | Status: DC | PRN
  Administered 2011-08-22 (×3): 2 mg via INTRAVENOUS
  Filled 2011-08-21 (×3): qty 1

## 2011-08-21 MED ORDER — ONDANSETRON HCL 4 MG/2ML IJ SOLN
4.0000 mg | Freq: Once | INTRAMUSCULAR | Status: AC
  Administered 2011-08-21: 4 mg via INTRAVENOUS
  Filled 2011-08-21: qty 2

## 2011-08-21 MED ORDER — ONDANSETRON HCL 4 MG/2ML IJ SOLN
4.0000 mg | Freq: Once | INTRAMUSCULAR | Status: AC
Start: 2011-08-21 — End: 2011-08-21
  Administered 2011-08-21: 4 mg via INTRAVENOUS
  Filled 2011-08-21: qty 2

## 2011-08-21 MED ORDER — HYDROMORPHONE HCL PF 1 MG/ML IJ SOLN
1.0000 mg | Freq: Once | INTRAMUSCULAR | Status: AC
  Administered 2011-08-21: 1 mg via INTRAVENOUS
  Filled 2011-08-21: qty 1

## 2011-08-21 MED ORDER — SODIUM CHLORIDE 0.9 % IV SOLN
INTRAVENOUS | Status: DC
  Administered 2011-08-22: 75 mL/h via INTRAVENOUS

## 2011-08-21 MED ORDER — COLCHICINE 0.6 MG PO TABS
0.6000 mg | ORAL_TABLET | Freq: Every day | ORAL | Status: DC
  Administered 2011-08-22 – 2011-08-25 (×4): 0.6 mg via ORAL
  Filled 2011-08-21 (×5): qty 1

## 2011-08-21 MED ORDER — PANTOPRAZOLE SODIUM 40 MG IV SOLR
40.0000 mg | Freq: Once | INTRAVENOUS | Status: AC
  Administered 2011-08-21: 40 mg via INTRAVENOUS
  Filled 2011-08-21: qty 40

## 2011-08-21 MED ORDER — SODIUM CHLORIDE 0.9 % IV SOLN
Freq: Once | INTRAVENOUS | Status: AC
  Administered 2011-08-21 (×2): via INTRAVENOUS

## 2011-08-21 MED ORDER — SODIUM CHLORIDE 0.9 % IV SOLN: Freq: Once | INTRAVENOUS | Status: DC

## 2011-08-21 MED ORDER — PROMETHAZINE HCL 25 MG RE SUPP
25.0000 mg | Freq: Four times a day (QID) | RECTAL | Status: DC | PRN
  Administered 2011-08-22 – 2011-08-23 (×2): 25 mg via RECTAL
  Filled 2011-08-21 (×4): qty 1

## 2011-08-21 NOTE — ED Provider Notes (Signed)
Medical screening examination/treatment/procedure(s) were conducted as a shared visit with non-physician practitioner(s) and myself.  I personally evaluated the patient during the encounter  Threasa Beards, MD 08/21/11 [id number]

## 2011-08-21 NOTE — ED Notes (Signed)
To CT

## 2011-08-21 NOTE — ED Provider Notes (Signed)
History     CSN: [id number]  Arrival date & time 08/21/11  1627   First MD Initiated Contact with Patient 08/21/11 1652      Chief Complaint  Patient presents with  . Abdominal Pain    (Consider location/radiation/quality/duration/timing/severity/associated sxs/prior treatment) HPI  Past Medical History  Diagnosis Date  . Hiatal hernia   . GERD (gastroesophageal reflux disease)   . Gout   . History of pulmonary embolism   . History of gastritis   . Internal hemorrhoids   . Post-traumatic stress syndrome   . AAA (abdominal aortic aneurysm)   . History of hepatitis C   . B12 deficiency   . History of small bowel obstruction   . Pancytopenia     Hx of  . Crohn's disease   . RLS (restless legs syndrome)   . Hyperoxaluria     Intestinal  . Chronic renal insufficiency, stage III (moderate)   . Hypertension   . Nephrolithiasis     Past Surgical History  Procedure Date  . Hernia repair   . Cholecystectomy   . Hemicolectomy     Right  . Bowel resection   . Knee arthroscopy     Left  . Ligament repair     Left knee  . Ankle surgery     Right  . Foot surgery     Right  . Terminal ileum resection     Family History  Problem Relation Age of Onset  . Kidney disease Father   . Colon cancer Neg Hx     History  Substance Use Topics  . Smoking status: Former Research scientist (life sciences)  . Smokeless tobacco: Not on file  . Alcohol Use: No      Review of Systems  Allergies  Lorazepam  Home Medications   Current Outpatient Rx  Name Route Sig Dispense Refill  . ATENOLOL 25 MG PO TABS Oral Take 25 mg by mouth daily.      . COLCHICINE 0.6 MG PO TABS Oral Take 0.6 mg by mouth daily.      . CYANOCOBALAMIN 1000 MCG PO TABS Oral Take 100 mcg by mouth every 14 (fourteen) days.      . FEBUXOSTAT 40 MG PO TABS Oral Take 80 mg by mouth daily.      . OMEGA-3 FATTY ACIDS 1000 MG PO CAPS Oral Take 1 capsule by mouth 2 (two) times daily.      Marland Kitchen GABAPENTIN 100 MG PO CAPS Oral Take 100  mg by mouth 2 (two) times daily.      Marland Kitchen MAGNESIUM OXIDE 420 MG PO TABS Oral Take 1 tablet by mouth 2 (two) times daily.     Marland Kitchen ONE-DAILY MULTI VITAMINS PO TABS Oral Take 1 tablet by mouth daily.      Marland Kitchen OMEPRAZOLE 20 MG PO CPDR Oral Take 20 mg by mouth daily.      Marland Kitchen PAROXETINE HCL 40 MG PO TABS Oral Take 40 mg by mouth at bedtime.      Marland Kitchen POTASSIUM CITRATE ER 10 MEQ (1080 MG) PO TBCR Oral Take 10 mEq by mouth 2 (two) times daily.    Marland Kitchen TERAZOSIN HCL 2 MG PO CAPS Oral Take 2 mg by mouth at bedtime.     . TRAZODONE HCL 150 MG PO TABS Oral Take 150 mg by mouth at bedtime.        BP 102/74  Pulse 68  Temp(Src) 98.3 F (36.8 C) (Oral)  Resp 20  SpO2 99%  Physical  Exam  ED Course  Procedures (including critical care time)  Labs Reviewed  CBC - Abnormal; Notable for the following:    Platelets 114 (*)    All other components within normal limits  BASIC METABOLIC PANEL - Abnormal; Notable for the following:    BUN 36 (*)    Creatinine, Ser 1.93 (*)    GFR calc non Af Amer 34 (*)    GFR calc Af Amer 39 (*)    All other components within normal limits  DIFFERENTIAL   Ct Abdomen Pelvis Wo Contrast  08/21/2011  *RADIOLOGY REPORT*  Clinical Data: Mid abdominal pain  CT ABDOMEN AND PELVIS WITHOUT CONTRAST  Technique:  Multidetector CT imaging of the abdomen and pelvis was performed following the standard protocol without intravenous contrast.  Comparison: Abdominal radiograph same date, CT 12/10/2010  Findings: Partially imaged nasogastric tube is coiled upon itself within the distal esophagus, with the tip oriented in a cephalad direction.  Heart size is normal.  No pericardial pleural effusion.  Minimal dependent atelectasis is noted at the lung bases bilaterally.  Cholecystectomy clips noted.  Unenhanced liver, spleen, adrenal glands, pancreas are unremarkable.  Splenic arterial calcification noted without aneurysm allowing for noncontrast technique.  A lobulated contour deformities to both  kidneys suggest cysts as previously seen, but some are too small to be definitively characterized.  Nonobstructing 1 mm bilateral renal calculi are noted.  No hydronephrosis.  No radiopaque ureteral or bladder calculi.  No ascites or free air.  Absence or effusion of the right rectus muscle is noted with overlying presumed surgical defect.  There is dilatation of the mid and distal small bowel to the level of the right lower quadrant. There is an ileocolic anastomosis.  Small bowel feces sign is present within the distal small bowel.  The colon is decompressed. No mass lesion is identified allowing for technique.  Transition point to nondilated distal small bowel is identified in the right lower quadrant, image 56, immediately subjacent to the anterior abdominal wall postsurgical change.  Pelvic viscera are unremarkable otherwise.  No acute osseous finding.  IMPRESSION: Nasogastric tube malposition.  Repositioning and re-imaging recommended.  Mid and distal small bowel dilatation to the level of a transition point in the right lower quadrant leading to decompressed distal - most small bowel, subjacent to the anterior abdominal wall postsurgical change, suggesting adhesion as the most likely etiology of this small bowel obstruction.  No free air or fluid.  Original Report Authenticated By: Arline Asp, M.D.   Dg Abd Acute W/chest  08/21/2011  *RADIOLOGY REPORT*  Clinical Data: Question small bowel obstruction.  Abdominal pain, vomiting, and history of small bowel obstruction.  ACUTE ABDOMEN SERIES (ABDOMEN 2 VIEW & CHEST 1 VIEW)  Comparison: Abdomen radiographs 12/11/2010 and 12/10/2010, acute abdominal series 12/08/2010,and CT abdomen pelvis 12/10/2010.  Findings: Stable borderline cardiomegaly.  Stable mildly ectatic thoracic aorta.  Subsegmental atelectasis versus scar at the left lung base.  Remainder of the lung fields clear.  No visible pleural effusion.  No free intraperitoneal air identified.   Cholecystectomy clips are present.  Surgical suture is seen in the right lower quadrant.  There are multiple scattered air-fluid levels in small bowel loops on the upright view of the abdomen.  On the supine view, there is suggestion of multiple dilated and fluid-filled bowel loops.  No definite dilatation of the colon.  Atherosclerotic calcification of the splenic artery.  No acute or suspicious bony abnormality.  IMPRESSION:  1.  Probable  partial small bowel obstruction. 2.  No acute cardiopulmonary disease.  Original Report Authenticated By: Curlene Dolphin, M.D.     Small Bowel Obstruction    MDM  Took over care of the patient from E. [place], PA-C - patient with a history of crohn's disease followed by Dr. [name] with history of recurrent SBO managed medically, presents with a several day history of the same.  Pain and nausea - no recent surgeries - spoke with Dr. Ernesto Rutherford with IM who will admit, she asks that we consult surgery for this, I am awaiting call from Dr. Ninfa Linden.      Spoke with Dr. Hassell Done, he will see the patient in consult, NG tube repositioned and patient feels a bit more comfortable.  Idalia Needle [place], [place] 08/21/11 2144

## 2011-08-21 NOTE — ED Notes (Signed)
Dr Claria Dice in to see for admission. Medicated with dilaudid /zofran and protonix

## 2011-08-21 NOTE — ED Notes (Signed)
Known for having SBO's abd is tender from epigastric area to LUQ, where bowel sounds are not auscultated

## 2011-08-21 NOTE — H&P (Signed)
PCP:   Purvis Kilts, MD, MD  Gastroenterology; Dr. Maurene Capes  Chief Complaint:  Abdominal pain  HPI: This is a 71 year old gentleman, with known history of recurrent small bowel obstructions and Crohn's disease he stated that today he developed abdominal pain that became progressively worse. His typical symptoms when obstructed. His abdominal pain was generalized, severe, continuous, crampy, 10/10. He developed abdominal distention. At approximately 5 PM he developed nausea and vomiting once no hematemesis. He states he's not had any bowel movements all day. He came to the ER. In the ER an NG tube was placed, he had immediate return off from 500 cc of biliary fluid. As stated he has had recurrent small bowel obstructions which have always been medically managed. History provided by patient and his wife is at the bedside.  Review of Systems: Positives bolded   anorexia, fever, weight loss,, vision loss, decreased hearing, hoarseness, chest pain, syncope, dyspnea on exertion, peripheral edema, balance deficits, hemoptysis, abdominal pain, melena, hematochezia, severe indigestion/heartburn, hematuria, incontinence, genital sores, muscle weakness, suspicious skin lesions, transient blindness, difficulty walking, depression, unusual weight change, abnormal bleeding, enlarged lymph nodes, angioedema, and breast masses.  Past Medical History: Past Medical History  Diagnosis Date  . Hiatal hernia   . GERD (gastroesophageal reflux disease)   . Gout   . History of pulmonary embolism   . History of gastritis   . Internal hemorrhoids   . Post-traumatic stress syndrome   . AAA (abdominal aortic aneurysm)   . History of hepatitis C   . B12 deficiency   . History of small bowel obstruction   . Pancytopenia     Hx of  . Crohn's disease   . RLS (restless legs syndrome)   . Hyperoxaluria     Intestinal  . Chronic renal insufficiency, stage III (moderate)   . Hypertension   . Nephrolithiasis     Past Surgical History  Procedure Date  . Hernia repair   . Cholecystectomy   . Hemicolectomy     Right  . Bowel resection   . Knee arthroscopy     Left  . Ligament repair     Left knee  . Ankle surgery     Right  . Foot surgery     Right  . Terminal ileum resection     Medications: Prior to Admission medications   Medication Sig Start Date End Date Taking? Authorizing Provider  atenolol (TENORMIN) 25 MG tablet Take 25 mg by mouth daily.     Yes Historical Provider, MD  colchicine 0.6 MG tablet Take 0.6 mg by mouth daily.     Yes Historical Provider, MD  cyanocobalamin 1000 MCG tablet Take 100 mcg by mouth every 14 (fourteen) days.     Yes Historical Provider, MD  febuxostat (ULORIC) 40 MG tablet Take 80 mg by mouth daily.     Yes Historical Provider, MD  fish oil-omega-3 fatty acids 1000 MG capsule Take 1 capsule by mouth 2 (two) times daily.     Yes Historical Provider, MD  gabapentin (NEURONTIN) 100 MG capsule Take 100 mg by mouth 2 (two) times daily.     Yes Historical Provider, MD  Magnesium Oxide 420 MG TABS Take 1 tablet by mouth 2 (two) times daily.    Yes Historical Provider, MD  Multiple Vitamin (MULTIVITAMIN) tablet Take 1 tablet by mouth daily.     Yes Historical Provider, MD  omeprazole (PRILOSEC) 20 MG capsule Take 20 mg by mouth daily.     Yes  Historical Provider, MD  PARoxetine (PAXIL) 40 MG tablet Take 40 mg by mouth at bedtime.     Yes Historical Provider, MD  potassium citrate (UROCIT-K) 10 MEQ (1080 MG) SR tablet Take 10 mEq by mouth 2 (two) times daily.   Yes Historical Provider, MD  terazosin (HYTRIN) 2 MG capsule Take 2 mg by mouth at bedtime.    Yes Historical Provider, MD  traZODone (DESYREL) 150 MG tablet Take 150 mg by mouth at bedtime.     Yes Historical Provider, MD    Allergies:   Allergies  Allergen Reactions  . Allopurinol   . Lorazepam     REACTION: hallucination    Social History:  reports that he has quit smoking. He does not have  any smokeless tobacco history on file. He reports that he does not drink alcohol or use illicit drugs.  Family History: Family History  Problem Relation Age of Onset  . Kidney disease Father   . Colon cancer Neg Hx     Physical Exam: Filed Vitals:   08/21/11 1633 08/21/11 2016  BP: 103/76 102/74  Pulse: 57 68  Temp: 97.4 F (36.3 C) 98.3 F (36.8 C)  TempSrc: Oral Oral  Resp: 18 20  SpO2: 100% 99%    General:  Alert and oriented times three, well developed and nourished, moderate discomfort Eyes: PERRLA, pink conjunctiva, no scleral icterus ENT: dry oral mucosa, neck supple, no thyromegaly Lungs: clear to ascultation, no wheeze, no crackles, no use of accessory muscles Cardiovascular: regular rate and rhythm, no regurgitation, no gallops, no murmurs. No carotid bruits, no JVD Abdomen: soft, no BS, distended, generalized tenderness to palpation  GU: not examined Neuro: CN II - XII grossly intact, sensation intact Musculoskeletal: strength 5/5 all extremities, no clubbing, cyanosis or edema Skin: no rash, no subcutaneous crepitation, no decubitus Psych: appropriate patient   Labs on Admission:   Basename 08/21/11 1705  NA 139  K 4.1  CL 102  CO2 26  GLUCOSE 93  BUN 36*  CREATININE 1.93*  CALCIUM 9.8  MG --  PHOS --   No results found for this basename: AST:2,ALT:2,ALKPHOS:2,BILITOT:2,PROT:2,ALBUMIN:2 in the last 72 hours No results found for this basename: LIPASE:2,AMYLASE:2 in the last 72 hours  Basename 08/21/11 1705  WBC 5.5  NEUTROABS 4.2  HGB 15.4  HCT 43.9  MCV 84.6  PLT 114*   No results found for this basename: CKTOTAL:3,CKMB:3,CKMBINDEX:3,TROPONINI:3 in the last 72 hours No components found with this basename: POCBNP:3 No results found for this basename: DDIMER:2 in the last 72 hours No results found for this basename: HGBA1C:2 in the last 72 hours No results found for this basename: CHOL:2,HDL:2,LDLCALC:2,TRIG:2,CHOLHDL:2,LDLDIRECT:2 in the last  72 hours No results found for this basename: TSH,T4TOTAL,FREET3,T3FREE,THYROIDAB in the last 72 hours No results found for this basename: VITAMINB12:2,FOLATE:2,FERRITIN:2,TIBC:2,IRON:2,RETICCTPCT:2 in the last 72 hours  Micro Results: No results found for this or any previous visit (from the past 240 hour(s)).   Radiological Exams on Admission: Ct Abdomen Pelvis Wo Contrast  08/21/2011  *RADIOLOGY REPORT*  Clinical Data: Mid abdominal pain  CT ABDOMEN AND PELVIS WITHOUT CONTRAST  Technique:  Multidetector CT imaging of the abdomen and pelvis was performed following the standard protocol without intravenous contrast.  Comparison: Abdominal radiograph same date, CT 12/10/2010  Findings: Partially imaged nasogastric tube is coiled upon itself within the distal esophagus, with the tip oriented in a cephalad direction.  Heart size is normal.  No pericardial pleural effusion.  Minimal dependent atelectasis is noted  at the lung bases bilaterally.  Cholecystectomy clips noted.  Unenhanced liver, spleen, adrenal glands, pancreas are unremarkable.  Splenic arterial calcification noted without aneurysm allowing for noncontrast technique.  A lobulated contour deformities to both kidneys suggest cysts as previously seen, but some are too small to be definitively characterized.  Nonobstructing 1 mm bilateral renal calculi are noted.  No hydronephrosis.  No radiopaque ureteral or bladder calculi.  No ascites or free air.  Absence or effusion of the right rectus muscle is noted with overlying presumed surgical defect.  There is dilatation of the mid and distal small bowel to the level of the right lower quadrant. There is an ileocolic anastomosis.  Small bowel feces sign is present within the distal small bowel.  The colon is decompressed. No mass lesion is identified allowing for technique.  Transition point to nondilated distal small bowel is identified in the right lower quadrant, image 56, immediately subjacent to the  anterior abdominal wall postsurgical change.  Pelvic viscera are unremarkable otherwise.  No acute osseous finding.  IMPRESSION: Nasogastric tube malposition.  Repositioning and re-imaging recommended.  Mid and distal small bowel dilatation to the level of a transition point in the right lower quadrant leading to decompressed distal - most small bowel, subjacent to the anterior abdominal wall postsurgical change, suggesting adhesion as the most likely etiology of this small bowel obstruction.  No free air or fluid.  Original Report Authenticated By: Arline Asp, M.D.   Dg Abd Acute W/chest  08/21/2011  *RADIOLOGY REPORT*  Clinical Data: Question small bowel obstruction.  Abdominal pain, vomiting, and history of small bowel obstruction.  ACUTE ABDOMEN SERIES (ABDOMEN 2 VIEW & CHEST 1 VIEW)  Comparison: Abdomen radiographs 12/11/2010 and 12/10/2010, acute abdominal series 12/08/2010,and CT abdomen pelvis 12/10/2010.  Findings: Stable borderline cardiomegaly.  Stable mildly ectatic thoracic aorta.  Subsegmental atelectasis versus scar at the left lung base.  Remainder of the lung fields clear.  No visible pleural effusion.  No free intraperitoneal air identified.  Cholecystectomy clips are present.  Surgical suture is seen in the right lower quadrant.  There are multiple scattered air-fluid levels in small bowel loops on the upright view of the abdomen.  On the supine view, there is suggestion of multiple dilated and fluid-filled bowel loops.  No definite dilatation of the colon.  Atherosclerotic calcification of the splenic artery.  No acute or suspicious bony abnormality.  IMPRESSION:  1.  Probable partial small bowel obstruction. 2.  No acute cardiopulmonary disease.  Original Report Authenticated By: Curlene Dolphin, M.D.    Assessment/Plan Present on Admission:  .SMALL BOWEL OBSTRUCTION -recurrent History of Crohn's disease -not a medication Admit to MedSurg N.p.o., NG tube, IV Protonix, pain  medications as needed and IV fluids for hydration  Surgeon Dr. Hassell Done is aware and will see patient in a.m.  Likely due to adhesion  .Pancytopenia .HEPATITIS C, HX OF .Gout, unspecified Hypertension  .CORONARY ATHEROSCLEROSIS NATIVE CORONARY ARTERY Stable, resume home medications  When necessary blood pressure medications Most by mouth medication held   Full code DVT prophylaxis Team 4/Dr. Clare Gandy, Chitara Clonch 08/21/2011, 8:54 PM

## 2011-08-21 NOTE — ED Provider Notes (Signed)
History     CSN: [id number]  Arrival date & time 08/21/11  1627   First MD Initiated Contact with Patient 08/21/11 1652      Chief Complaint  Patient presents with  . Abdominal Pain    (Consider location/radiation/quality/duration/timing/severity/associated sxs/prior treatment) HPI Comments: Patient with hx multiple abdominal surgeries, crohn's disease, recurrent SBO, reports diffuse abdominal pain, distension, inability to pass flatus, and nausea.  Reports this feels like a prior SBO.  Had a small bowel movement this morning prior to symptoms starting.  No vomiting yet, states he has not reached that stage yet.    Patient is a 71 y.o. male presenting with abdominal pain. The history is provided by the patient.  Abdominal Pain The primary symptoms of the illness include abdominal pain. The primary symptoms of the illness do not include shortness of breath or dysuria.  Symptoms associated with the illness do not include urgency or frequency.    Past Medical History  Diagnosis Date  . Hiatal hernia   . GERD (gastroesophageal reflux disease)   . Gout   . History of pulmonary embolism   . History of gastritis   . Internal hemorrhoids   . Post-traumatic stress syndrome   . AAA (abdominal aortic aneurysm)   . History of hepatitis C   . B12 deficiency   . History of small bowel obstruction   . Pancytopenia     Hx of  . Crohn's disease   . RLS (restless legs syndrome)   . Hyperoxaluria     Intestinal  . Chronic renal insufficiency, stage III (moderate)   . Hypertension   . Nephrolithiasis     Past Surgical History  Procedure Date  . Hernia repair   . Cholecystectomy   . Hemicolectomy     Right  . Bowel resection   . Knee arthroscopy     Left  . Ligament repair     Left knee  . Ankle surgery     Right  . Foot surgery     Right  . Terminal ileum resection     Family History  Problem Relation Age of Onset  . Kidney disease Father   . Colon cancer Neg Hx      History  Substance Use Topics  . Smoking status: Former Research scientist (life sciences)  . Smokeless tobacco: Not on file  . Alcohol Use: No      Review of Systems  Respiratory: Negative for shortness of breath.   Cardiovascular: Negative for chest pain.  Gastrointestinal: Positive for abdominal pain.  Genitourinary: Negative for dysuria, urgency and frequency.  All other systems reviewed and are negative.    Allergies  Lorazepam  Home Medications   Current Outpatient Rx  Name Route Sig Dispense Refill  . ATENOLOL 25 MG PO TABS Oral Take 25 mg by mouth daily.      . COLCHICINE 0.6 MG PO TABS Oral Take 0.6 mg by mouth daily.      . CYANOCOBALAMIN 1000 MCG PO TABS Oral Take 100 mcg by mouth every 14 (fourteen) days.      . FEBUXOSTAT 40 MG PO TABS Oral Take 80 mg by mouth daily.      . OMEGA-3 FATTY ACIDS 1000 MG PO CAPS Oral Take 1 capsule by mouth 2 (two) times daily.      Marland Kitchen GABAPENTIN 100 MG PO CAPS Oral Take 100 mg by mouth 2 (two) times daily.      Marland Kitchen MAGNESIUM OXIDE 420 MG PO TABS Oral Take  1 tablet by mouth 2 (two) times daily.     Marland Kitchen ONE-DAILY MULTI VITAMINS PO TABS Oral Take 1 tablet by mouth daily.      Marland Kitchen OMEPRAZOLE 20 MG PO CPDR Oral Take 20 mg by mouth daily.      Marland Kitchen PAROXETINE HCL 40 MG PO TABS Oral Take 40 mg by mouth at bedtime.      Marland Kitchen POTASSIUM CITRATE ER 10 MEQ (1080 MG) PO TBCR Oral Take 10 mEq by mouth 2 (two) times daily.    Marland Kitchen TERAZOSIN HCL 2 MG PO CAPS Oral Take 2 mg by mouth at bedtime.     . TRAZODONE HCL 150 MG PO TABS Oral Take 150 mg by mouth at bedtime.        BP 103/76  Pulse 57  Temp(Src) 97.4 F (36.3 C) (Oral)  Resp 18  SpO2 100%  Physical Exam  Constitutional: He is oriented to person, place, and time. He appears well-developed and well-nourished.  HENT:  Head: Normocephalic and atraumatic.  Neck: Neck supple.  Cardiovascular: Normal rate, regular rhythm and normal heart sounds.   Pulmonary/Chest: Breath sounds normal. No respiratory distress. He has no  wheezes. He has no rales. He exhibits no tenderness.  Abdominal: Soft. Bowel sounds are normal. He exhibits distension. There is tenderness. There is no rebound and no guarding.       Diffuse tenderness, worst in suprapubic area  Neurological: He is alert and oriented to person, place, and time.    ED Course  Procedures (including critical care time)   Labs Reviewed  CBC  DIFFERENTIAL  I-STAT, CHEM 8   No results found.  5:44 PM Patient discussed with Dr Canary Brim.   7:12 PM Patient reports increasing pain, distension, and nausea.  NG tube is in place but not turned on pending CT abd/pelvis.  Per CT tech, patient to have CT after 7:50.  Pt requests pain and nausea medications  No diagnosis found.    MDM  Patient with hx of crohn's disease and multiple abdominal surgeries, hx SBO.  Here with same.  Patient discussed with Ignacia Felling, PA-C, who assumes care of patient at change of shift.  Plan is for admission for SBO pending CT results.          [place], [place] 08/21/11 2016

## 2011-08-21 NOTE — ED Notes (Signed)
Placed 14 fr. Ng tube to right nostril, pt tol well, positive bowel tones

## 2011-08-21 NOTE — ED Notes (Signed)
NGT not draining CT showed curled in stomach attempted to reposition w/o success. Pulled and replaced with 14FR  [place] NGT.  Verified placement x 2 methods. Bile stained fluid 1000cc retuemed under low suction intermittant

## 2011-08-21 NOTE — ED Notes (Signed)
Pt with history of Crohn's disease and 2 previous sx started having abdominal cramping and pain this am. Last Bowel movement was this am. Pt States "I feel like I have a blockage again". Was seen in Apr/May for a blockage that resolved without surgery.

## 2011-08-21 NOTE — ED Provider Notes (Signed)
Medical screening examination/treatment/procedure(s) were conducted as a shared visit with non-physician practitioner(s) and myself.  I personally evaluated the patient during the encounter  Pt seen and evaluated.  Pt with diffuse abdominal pain, nontender abdomen after pain control.  Pt with likely SBO on xray.  CT scan obtained.  NG tube has been placed.  Plan is for admission.  Pt is agreeable with this plan.   Threasa Beards, MD 08/21/11 2116

## 2011-08-21 NOTE — ED Notes (Signed)
Patient returned from CT .  NGT placement checked and connected to Intermitant suction

## 2011-08-22 ENCOUNTER — Encounter (HOSPITAL_COMMUNITY): Payer: Self-pay | Admitting: Internal Medicine

## 2011-08-22 DIAGNOSIS — N179 Acute kidney failure, unspecified: Secondary | ICD-10-CM | POA: Diagnosis present

## 2011-08-22 DIAGNOSIS — N183 Chronic kidney disease, stage 3 unspecified: Secondary | ICD-10-CM

## 2011-08-22 DIAGNOSIS — F419 Anxiety disorder, unspecified: Secondary | ICD-10-CM | POA: Diagnosis present

## 2011-08-22 DIAGNOSIS — K5000 Crohn's disease of small intestine without complications: Secondary | ICD-10-CM | POA: Diagnosis not present

## 2011-08-22 DIAGNOSIS — K56609 Unspecified intestinal obstruction, unspecified as to partial versus complete obstruction: Secondary | ICD-10-CM

## 2011-08-22 DIAGNOSIS — N1832 Chronic kidney disease, stage 3b: Secondary | ICD-10-CM | POA: Diagnosis present

## 2011-08-22 DIAGNOSIS — D696 Thrombocytopenia, unspecified: Secondary | ICD-10-CM | POA: Diagnosis present

## 2011-08-22 DIAGNOSIS — N189 Chronic kidney disease, unspecified: Secondary | ICD-10-CM | POA: Diagnosis not present

## 2011-08-22 DIAGNOSIS — K5080 Crohn's disease of both small and large intestine without complications: Secondary | ICD-10-CM | POA: Diagnosis not present

## 2011-08-22 DIAGNOSIS — D61818 Other pancytopenia: Secondary | ICD-10-CM | POA: Diagnosis not present

## 2011-08-22 DIAGNOSIS — N184 Chronic kidney disease, stage 4 (severe): Secondary | ICD-10-CM | POA: Diagnosis present

## 2011-08-22 HISTORY — DX: Chronic kidney disease, stage 3 unspecified: N18.30

## 2011-08-22 LAB — BASIC METABOLIC PANEL
Calcium: 9.1 mg/dL (ref 8.4–10.5)
Chloride: 105 mEq/L (ref 96–112)
Creatinine, Ser: 2.11 mg/dL — ABNORMAL HIGH (ref 0.50–1.35)
GFR calc Af Amer: 35 mL/min — ABNORMAL LOW (ref >90)
GFR calc non Af Amer: 31 mL/min — ABNORMAL LOW (ref >90)

## 2011-08-22 LAB — CBC
Platelets: 111 10*3/uL — ABNORMAL LOW (ref 150–400)
RDW: 14.1 % (ref 11.5–15.5)
WBC: 7.1 10*3/uL (ref 4.0–10.5)

## 2011-08-22 MED ORDER — PANTOPRAZOLE SODIUM 40 MG IV SOLR
40.0000 mg | Freq: Every day | INTRAVENOUS | Status: DC
  Administered 2011-08-22 – 2011-08-24 (×4): 40 mg via INTRAVENOUS
  Filled 2011-08-22 (×6): qty 40

## 2011-08-22 MED ORDER — ENOXAPARIN SODIUM 40 MG/0.4ML ~~LOC~~ SOLN
40.0000 mg | SUBCUTANEOUS | Status: DC
  Administered 2011-08-22 – 2011-08-25 (×4): 40 mg via SUBCUTANEOUS
  Filled 2011-08-22 (×5): qty 0.4

## 2011-08-22 MED ORDER — DEXTROSE-NACL 5-0.9 % IV SOLN
INTRAVENOUS | Status: DC
  Administered 2011-08-22: 75 mL/h via INTRAVENOUS
  Administered 2011-08-23: 100 mL/h via INTRAVENOUS
  Administered 2011-08-23 – 2011-08-24 (×3): via INTRAVENOUS

## 2011-08-22 MED ORDER — PAROXETINE HCL 20 MG PO TABS
40.0000 mg | ORAL_TABLET | Freq: Every day | ORAL | Status: DC
  Administered 2011-08-22 – 2011-08-24 (×2): 40 mg via ORAL
  Filled 2011-08-22 (×5): qty 2

## 2011-08-22 MED ORDER — PNEUMOCOCCAL VAC POLYVALENT 25 MCG/0.5ML IJ INJ
0.5000 mL | INJECTION | INTRAMUSCULAR | Status: AC
  Filled 2011-08-22: qty 0.5

## 2011-08-22 NOTE — Progress Notes (Signed)
PATIENT DETAILS Name: Derek [name] Age: 71 y.o. Sex: male Date of Birth: [birth year] Admit Date: 08/21/2011 ZOX:[id number] CABOT, MD, MD Emergency contact:   CONSULTS: 1.  Dr. Johnathan Hausen, Surgery 2.  Dr. Silvano Rusk, Gastroenterology  Interval History: Derek [name] is a 71 year old male with a PMH of Crohn's disease s/p small bowel resection and recurrent SBO who presented to the hospital on 08/21/11 with abdominal pain.  A CT scan of the abdomen done on admission showed SBO.  Surgical consultation was performed on 08/22/11 and was felt to have a possible distal stricture at the ileocolonic anastomosis.    ROS: *Derek [name] still feels bloated up "like a tick", but states this has improved since the NG tube was placed.  No flatus or BMs.  Occasional crampy abdominal pain.   Objective: Vital signs in last 24 hours: Temp:  [97.4 F (36.3 C)-99.9 F (37.7 C)] 97.9 F (36.6 C) (01/19 1410) Pulse Rate:  [57-76] 67  (01/19 1410) Resp:  [18-20] 18  (01/19 1410) BP: (98-114)/(61-77) 98/64 mmHg (01/19 1410) SpO2:  [92 %-100 %] 92 % (01/19 1410) Weight:  [89.9 kg (198 lb 3.1 oz)] 89.9 kg (198 lb 3.1 oz) (01/18 2325) Weight change:  Last BM Date: 08/21/11  Intake/Output from previous day:  Intake/Output Summary (Last 24 hours) at 08/22/11 1519 Last data filed at 08/22/11 1100  Gross per 24 hour  Intake 593.75 ml  Output    800 ml  Net -206.25 ml     Physical Exam:  Gen:  NAD Cardiovascular:  RRR, No M/R/G Respiratory: Lungs CTAB Gastrointestinal: Abdomen distended, NT, no rebound or guarding.  + BS Extremities: No C/E/C     Lab Results: Basic Metabolic Panel:  Lab 19/14/78 0352 08/21/11 1705  NA 140 139  K 5.0 4.1  CL 105 102  CO2 24 26  GLUCOSE 121* 93  BUN 40* 36*  CREATININE 2.11* 1.93*  CALCIUM 9.1 9.8  MG -- --  PHOS -- --   GFR Estimated Creatinine Clearance: 36.8 ml/min (by C-G formula based on Cr of 2.11).  CBC:  Lab 08/22/11 0352  08/21/11 1705  WBC 7.1 5.5  NEUTROABS -- 4.2  HGB 15.3 15.4  HCT 45.3 43.9  MCV 85.0 84.6  PLT 111* 114*    Studies/Results: Ct Abdomen Pelvis Wo Contrast  08/21/2011  *RADIOLOGY REPORT*  Clinical Data: Mid abdominal pain  CT ABDOMEN AND PELVIS WITHOUT CONTRAST  Technique:  Multidetector CT imaging of the abdomen and pelvis was performed following the standard protocol without intravenous contrast.  Comparison: Abdominal radiograph same date, CT 12/10/2010  Findings: Partially imaged nasogastric tube is coiled upon itself within the distal esophagus, with the tip oriented in a cephalad direction.  Heart size is normal.  No pericardial pleural effusion.  Minimal dependent atelectasis is noted at the lung bases bilaterally.  Cholecystectomy clips noted.  Unenhanced liver, spleen, adrenal glands, pancreas are unremarkable.  Splenic arterial calcification noted without aneurysm allowing for noncontrast technique.  A lobulated contour deformities to both kidneys suggest cysts as previously seen, but some are too small to be definitively characterized.  Nonobstructing 1 mm bilateral renal calculi are noted.  No hydronephrosis.  No radiopaque ureteral or bladder calculi.  No ascites or free air.  Absence or effusion of the right rectus muscle is noted with overlying presumed surgical defect.  There is dilatation of the mid and distal small bowel to the level of the right lower quadrant. There is an ileocolic  anastomosis.  Small bowel feces sign is present within the distal small bowel.  The colon is decompressed. No mass lesion is identified allowing for technique.  Transition point to nondilated distal small bowel is identified in the right lower quadrant, image 56, immediately subjacent to the anterior abdominal wall postsurgical change.  Pelvic viscera are unremarkable otherwise.  No acute osseous finding.  IMPRESSION: Nasogastric tube malposition.  Repositioning and re-imaging recommended.  Mid and distal  small bowel dilatation to the level of a transition point in the right lower quadrant leading to decompressed distal - most small bowel, subjacent to the anterior abdominal wall postsurgical change, suggesting adhesion as the most likely etiology of this small bowel obstruction.  No free air or fluid.  Original Report Authenticated By: Arline Asp, M.D.   Dg Abd Acute W/chest  08/21/2011  *RADIOLOGY REPORT*  Clinical Data: Question small bowel obstruction.  Abdominal pain, vomiting, and history of small bowel obstruction.  ACUTE ABDOMEN SERIES (ABDOMEN 2 VIEW & CHEST 1 VIEW)  Comparison: Abdomen radiographs 12/11/2010 and 12/10/2010, acute abdominal series 12/08/2010,and CT abdomen pelvis 12/10/2010.  Findings: Stable borderline cardiomegaly.  Stable mildly ectatic thoracic aorta.  Subsegmental atelectasis versus scar at the left lung base.  Remainder of the lung fields clear.  No visible pleural effusion.  No free intraperitoneal air identified.  Cholecystectomy clips are present.  Surgical suture is seen in the right lower quadrant.  There are multiple scattered air-fluid levels in small bowel loops on the upright view of the abdomen.  On the supine view, there is suggestion of multiple dilated and fluid-filled bowel loops.  No definite dilatation of the colon.  Atherosclerotic calcification of the splenic artery.  No acute or suspicious bony abnormality.  IMPRESSION:  1.  Probable partial small bowel obstruction. 2.  No acute cardiopulmonary disease.  Original Report Authenticated By: Curlene Dolphin, M.D.    Medications: Scheduled Meds:    . sodium chloride   Intravenous Once  . colchicine  0.6 mg Oral Daily  . enoxaparin  40 mg Subcutaneous Q24H  .  HYDROmorphone (DILAUDID) injection  1 mg Intravenous Once  .  HYDROmorphone (DILAUDID) injection  1 mg Intravenous Once  .  morphine injection  4 mg Intravenous Once  .  morphine injection  4 mg Intravenous Once  . ondansetron (ZOFRAN) IV  4 mg  Intravenous Once  . ondansetron (ZOFRAN) IV  4 mg Intravenous Once  . ondansetron (ZOFRAN) IV  4 mg Intravenous Once  . pantoprazole (PROTONIX) IV  40 mg Intravenous QHS  . pantoprazole (PROTONIX) IV  40 mg Intravenous Once  . PARoxetine  40 mg Oral QHS  . pneumococcal 23 valent vaccine  0.5 mL Intramuscular Tomorrow-1000  . DISCONTD: sodium chloride   Intravenous Once   Continuous Infusions:    . sodium chloride 75 mL/hr at 08/21/11 2230   PRN Meds:.HYDROmorphone (DILAUDID) injection, promethazine Antibiotics: Anti-infectives    None       Assessment/Plan:  Principal Problem:  *SMALL BOWEL OBSTRUCTION The patient was admitted and a diagnostic evaluation confirmed SBO.  Surgical consultation was requested and kindly provided by Dr. Hassell Done, who felt the obstruction was due to a stricture at the ileocolonic anastomosis.  He recommended GI consultation for possible colonoscopic dilatation.  The patient was placed on bowel rest and an NG tube was placed for gastric decompression. Active Problems:  Gout, unspecified The patient has been given his usual dose of colchicine, with clamping of the NG tube x 30  minutes post dosing.   CORONARY ATHEROSCLEROSIS NATIVE CORONARY ARTERY Does not appear to be on ASA PTA.  Patient states he stopped taking aspirin due to problems with bleeding.  Beta blocker currently on hold.  No c/o chest pain.  CROHN'S DISEASE The patient states that he has not had any recent flares, and that he is not currently on any treatment.  Has been on Humira in the past.  GI consulted.  PULMONARY EMBOLISM, HX OF On prophylactic dose lovenox.  Anxiety Resume paxil to prevent withdrawal phenomenon.  Clamp NG tube x 30 minutes post dosing.  CKD (chronic kidney disease) stage 3, GFR 30-59 ml/min / ARF Baseline creatinine 1.55 (12/12/10).  Current creatinine elevated over usual baseline.  Likely pre-renal.  Hydrate.  Thrombocytopenia H/O pancytopenia.  Platelet count only  mildly low.  Monitor.    LOS: 1 day   Jacquelynn Cree, MD Pager [phone]  08/22/2011, 3:19 PM

## 2011-08-22 NOTE — Consult Note (Signed)
Chief Complaint:  Recurrent small intestinal blockage  History of Present Illness:  Derek [name] is an 71 y.o. male was admitted last night with a recurrent SBO by CT scan.  An NG tube has returned 3 cannisters of enteral contents.  On Thursday night he ate raw baby carrots.  The last SBO he had eaten peanuts.  Apparently he has developed a distal stricture related to his Crohn's resection many years ago done in [place], [place].    Past Medical History  Diagnosis Date  . Hiatal hernia   . GERD (gastroesophageal reflux disease)   . Gout   . History of pulmonary embolism   . History of gastritis   . Internal hemorrhoids   . Post-traumatic stress syndrome   . AAA (abdominal aortic aneurysm)   . History of hepatitis C   . B12 deficiency   . History of small bowel obstruction   . Pancytopenia     Hx of  . Crohn's disease   . RLS (restless legs syndrome)   . Hyperoxaluria     Intestinal  . Chronic renal insufficiency, stage III (moderate)   . Hypertension   . Nephrolithiasis     Past Surgical History  Procedure Date  . Hernia repair   . Cholecystectomy   . Hemicolectomy     Right  . Bowel resection   . Knee arthroscopy     Left  . Ligament repair     Left knee  . Ankle surgery     Right  . Foot surgery     Right  . Terminal ileum resection     Current Facility-Administered Medications  Medication Dose Route Frequency Provider Last Rate Last Dose  . 0.9 %  sodium chloride infusion   Intravenous Once Otis Brace, PA      . 0.9 %  sodium chloride infusion   Intravenous Continuous Debby Crosley, MD 75 mL/hr at 08/21/11 2230    . colchicine tablet 0.6 mg  0.6 mg Oral Daily Debby Crosley, MD      . enoxaparin (LOVENOX) injection 40 mg  40 mg Subcutaneous Q24H Debby Crosley, MD      . HYDROmorphone (DILAUDID) injection 1 mg  1 mg Intravenous Once Otis Brace, PA   1 mg at 08/21/11 1920  . HYDROmorphone (DILAUDID) injection 1 mg  1 mg Intravenous Once Joaquim Lai C. Sanford,  PA   1 mg at 08/21/11 2200  . HYDROmorphone (DILAUDID) injection 2 mg  2 mg Intravenous Q4H PRN Quintella Baton, MD   2 mg at 08/22/11 0213  . morphine 4 MG/ML injection 4 mg  4 mg Intravenous Once Otis Brace, PA   4 mg at 08/21/11 1730  . morphine 4 MG/ML injection 4 mg  4 mg Intravenous Once Otis Brace, PA   4 mg at 08/21/11 1818  . ondansetron (ZOFRAN) injection 4 mg  4 mg Intravenous Once Otis Brace, PA   4 mg at 08/21/11 1730  . ondansetron (ZOFRAN) injection 4 mg  4 mg Intravenous Once Otis Brace, PA   4 mg at 08/21/11 1922  . ondansetron (ZOFRAN) injection 4 mg  4 mg Intravenous Once Joaquim Lai C. Sanford, [place]   4 mg at 08/21/11 2201  . pantoprazole (PROTONIX) injection 40 mg  40 mg Intravenous QHS Debby Crosley, MD   40 mg at 08/22/11 0123  . pantoprazole (PROTONIX) injection 40 mg  40 mg Intravenous Once Debby Crosley, MD   40 mg at  08/21/11 2200  . pneumococcal 23 valent vaccine (PNU-IMMUNE) injection 0.5 mL  0.5 mL Intramuscular Tomorrow-1000 Debby Crosley, MD      . promethazine (PHENERGAN) suppository 25 mg  25 mg Rectal Q6H PRN Quintella Baton, MD      . DISCONTD: 0.9 %  sodium chloride infusion   Intravenous Once Otis Brace, PA       Allopurinol and Lorazepam Family History  Problem Relation Age of Onset  . Kidney disease Father   . Colon cancer Neg Hx    Social History:   reports that he has quit smoking. He does not have any smokeless tobacco history on file. He reports that he does not drink alcohol or use illicit drugs.   REVIEW OF SYSTEMS - PERTINENT POSITIVES ONLY: Recurrent SBO related to PO fiber intake  Physical Exam:   Blood pressure 101/65, pulse 76, temperature 99.9 F (37.7 C), temperature source Oral, resp. rate 18, height 6' (1.829 m), weight 198 lb 3.1 oz (89.9 kg), SpO2 94.00%. Body mass index is 26.88 kg/(m^2).  Gen:  WDWN white male NAD  Neurological: Alert and oriented to person, place, and time. Motor and sensory function is grossly intact  Head:  Normocephalic and atraumatic.  Eyes: Conjunctivae are normal. Pupils are equal, round, and reactive to light. No scleral icterus.  Neck: Normal range of motion. Neck supple. No tracheal deviation or thyromegaly present.  Cardiovascular:  SR without murmurs or gallops.  No carotid bruits Respiratory: Effort normal.  No respiratory distress. No chest wall tenderness. Breath sounds normal.  No wheezes, rales or rhonchi.  Abdomen:  Multiple well healed scars.  dEcompressed now according to patient.  Nontender with no rebound GU: Musculoskeletal: Normal range of motion. Extremities are nontender. No cyanosis, edema or clubbing noted Lymphadenopathy: No cervical, preauricular, postauricular or axillary adenopathy is present Skin: Skin is warm and dry. No rash noted. No diaphoresis. No erythema. No pallor. Pscyh: Normal mood and affect. Behavior is normal. Judgment and thought content normal.   LABORATORY RESULTS: Results for orders placed during the hospital encounter of 08/21/11 (from the past 48 hour(s))  CBC     Status: Abnormal   Collection Time   08/21/11  5:05 PM      Component Value Range Comment   WBC 5.5  4.0 - 10.5 (K/uL)    RBC 5.19  4.22 - 5.81 (MIL/uL)    Hemoglobin 15.4  13.0 - 17.0 (g/dL)    HCT 43.9  39.0 - 52.0 (%)    MCV 84.6  78.0 - 100.0 (fL)    MCH 29.7  26.0 - 34.0 (pg)    MCHC 35.1  30.0 - 36.0 (g/dL)    RDW 14.1  11.5 - 15.5 (%)    Platelets 114 (*) 150 - 400 (K/uL)   DIFFERENTIAL     Status: Normal   Collection Time   08/21/11  5:05 PM      Component Value Range Comment   Neutrophils Relative 77  43 - 77 (%)    Neutro Abs 4.2  1.7 - 7.7 (K/uL)    Lymphocytes Relative 16  12 - 46 (%)    Lymphs Abs 0.9  0.7 - 4.0 (K/uL)    Monocytes Relative 6  3 - 12 (%)    Monocytes Absolute 0.3  0.1 - 1.0 (K/uL)    Eosinophils Relative 2  0 - 5 (%)    Eosinophils Absolute 0.1  0.0 - 0.7 (K/uL)    Basophils Relative 0  0 - 1 (%)    Basophils Absolute 0.0  0.0 - 0.1 (K/uL)     BASIC METABOLIC PANEL     Status: Abnormal   Collection Time   08/21/11  5:05 PM      Component Value Range Comment   Sodium 139  135 - 145 (mEq/L)    Potassium 4.1  3.5 - 5.1 (mEq/L)    Chloride 102  96 - 112 (mEq/L)    CO2 26  19 - 32 (mEq/L)    Glucose, Bld 93  70 - 99 (mg/dL)    BUN 36 (*) 6 - 23 (mg/dL)    Creatinine, Ser 1.93 (*) 0.50 - 1.35 (mg/dL)    Calcium 9.8  8.4 - 10.5 (mg/dL)    GFR calc non Af Amer 34 (*) >90 (mL/min)    GFR calc Af Amer 39 (*) >90 (mL/min)   BASIC METABOLIC PANEL     Status: Abnormal   Collection Time   08/22/11  3:52 AM      Component Value Range Comment   Sodium 140  135 - 145 (mEq/L)    Potassium 5.0  3.5 - 5.1 (mEq/L)    Chloride 105  96 - 112 (mEq/L)    CO2 24  19 - 32 (mEq/L)    Glucose, Bld 121 (*) 70 - 99 (mg/dL)    BUN 40 (*) 6 - 23 (mg/dL)    Creatinine, Ser 2.11 (*) 0.50 - 1.35 (mg/dL)    Calcium 9.1  8.4 - 10.5 (mg/dL)    GFR calc non Af Amer 31 (*) >90 (mL/min)    GFR calc Af Amer 35 (*) >90 (mL/min)   CBC     Status: Abnormal   Collection Time   08/22/11  3:52 AM      Component Value Range Comment   WBC 7.1  4.0 - 10.5 (K/uL)    RBC 5.33  4.22 - 5.81 (MIL/uL)    Hemoglobin 15.3  13.0 - 17.0 (g/dL)    HCT 45.3  39.0 - 52.0 (%)    MCV 85.0  78.0 - 100.0 (fL)    MCH 28.7  26.0 - 34.0 (pg)    MCHC 33.8  30.0 - 36.0 (g/dL)    RDW 14.1  11.5 - 15.5 (%)    Platelets 111 (*) 150 - 400 (K/uL)     RADIOLOGY RESULTS: Ct Abdomen Pelvis Wo Contrast  08/21/2011  *RADIOLOGY REPORT*  Clinical Data: Mid abdominal pain  CT ABDOMEN AND PELVIS WITHOUT CONTRAST  Technique:  Multidetector CT imaging of the abdomen and pelvis was performed following the standard protocol without intravenous contrast.  Comparison: Abdominal radiograph same date, CT 12/10/2010  Findings: Partially imaged nasogastric tube is coiled upon itself within the distal esophagus, with the tip oriented in a cephalad direction.  Heart size is normal.  No pericardial pleural  effusion.  Minimal dependent atelectasis is noted at the lung bases bilaterally.  Cholecystectomy clips noted.  Unenhanced liver, spleen, adrenal glands, pancreas are unremarkable.  Splenic arterial calcification noted without aneurysm allowing for noncontrast technique.  A lobulated contour deformities to both kidneys suggest cysts as previously seen, but some are too small to be definitively characterized.  Nonobstructing 1 mm bilateral renal calculi are noted.  No hydronephrosis.  No radiopaque ureteral or bladder calculi.  No ascites or free air.  Absence or effusion of the right rectus muscle is noted with overlying presumed surgical defect.  There is dilatation of the mid and distal small  bowel to the level of the right lower quadrant. There is an ileocolic anastomosis.  Small bowel feces sign is present within the distal small bowel.  The colon is decompressed. No mass lesion is identified allowing for technique.  Transition point to nondilated distal small bowel is identified in the right lower quadrant, image 56, immediately subjacent to the anterior abdominal wall postsurgical change.  Pelvic viscera are unremarkable otherwise.  No acute osseous finding.  IMPRESSION: Nasogastric tube malposition.  Repositioning and re-imaging recommended.  Mid and distal small bowel dilatation to the level of a transition point in the right lower quadrant leading to decompressed distal - most small bowel, subjacent to the anterior abdominal wall postsurgical change, suggesting adhesion as the most likely etiology of this small bowel obstruction.  No free air or fluid.  Original Report Authenticated By: Arline Asp, M.D.   Dg Abd Acute W/chest  08/21/2011  *RADIOLOGY REPORT*  Clinical Data: Question small bowel obstruction.  Abdominal pain, vomiting, and history of small bowel obstruction.  ACUTE ABDOMEN SERIES (ABDOMEN 2 VIEW & CHEST 1 VIEW)  Comparison: Abdomen radiographs 12/11/2010 and 12/10/2010, acute  abdominal series 12/08/2010,and CT abdomen pelvis 12/10/2010.  Findings: Stable borderline cardiomegaly.  Stable mildly ectatic thoracic aorta.  Subsegmental atelectasis versus scar at the left lung base.  Remainder of the lung fields clear.  No visible pleural effusion.  No free intraperitoneal air identified.  Cholecystectomy clips are present.  Surgical suture is seen in the right lower quadrant.  There are multiple scattered air-fluid levels in small bowel loops on the upright view of the abdomen.  On the supine view, there is suggestion of multiple dilated and fluid-filled bowel loops.  No definite dilatation of the colon.  Atherosclerotic calcification of the splenic artery.  No acute or suspicious bony abnormality.  IMPRESSION:  1.  Probable partial small bowel obstruction. 2.  No acute cardiopulmonary disease.  Original Report Authenticated By: Curlene Dolphin, M.D.    Problem List: Patient Active Problem List  Diagnoses  . B12 DEFICIENCY  . Gout, unspecified  . Pancytopenia  . POST TRAUMATIC STRESS SYNDROME  . CORONARY ATHEROSCLEROSIS NATIVE CORONARY ARTERY  . ANEURYSM, THORACIC AORTIC  . ABDOMINAL AORTIC ANEURYSM  . INTERNAL HEMORRHOIDS  . GERD  . HIATAL HERNIA  . CROHN'S DISEASE, SMALL INTESTINE  . CROHN'S DISEASE  . SMALL BOWEL OBSTRUCTION  . PANCREATITIS, ACUTE  . RENAL DISEASE, CHRONIC, MILD  . CHEST PAIN UNSPECIFIED  . HEPATITIS C, HX OF  . PULMONARY EMBOLISM, HX OF  . GASTRITIS, HX OF    Assessment & Plan: Likely distal stricture at ileocolonic anastomosis.  Would discuss with Dr. [name] as colonoscopic dilatation may be required.  He may need to be careful regarding PO fiber intake to avoid food bezoars.      Matt B. Hassell Done, MD, [facility] [phone] beeper [phone]  08/22/2011 9:47 AM

## 2011-08-22 NOTE — Progress Notes (Signed)
Patient unable to void since coming to floor, has hx of prostate problems, takes Flomax. Patient has tried to urinate using various ways to stimulate urination.  Sitting up on [facility], wife next to him. Has no trouble urinating at home, per patient. Will let oncoming nurse know the situation.

## 2011-08-22 NOTE — Consult Note (Addendum)
[facility] Consultation  Referring Provider: Hospitalist and Dr. Kaylyn Lim Primary Care Physician:  Derek Kilts, MD, MD Primary Gastroenterologist:  Dr. [name]  Reason for Consultation:  Crohn's disease, admitted with small bowel obstruction, question related to Crohn's stricture  HPI: Derek [name] is a 71 y.o. male with a long history of small bowel Crohn's disease. He is status post a small bowel and cecal resection years ago. He has had intermittent small bowel obstructions over the years. His Crohn's therapy has included steroids, 6 MP, and Humira. He had to stop 6-MP because of neutropenia associated with allopurinol therapy in the past as well as hyper oxaluria problems. Humira was stopped in 2010, after he had been taking it for about 3 months and developed pancreatitis in the tail of his pancreas. He took a course of Entocort for several months that year and eventually stopped and has been off therapy for some time. He has done well since without small bowel obstruction. He denies diarrhea or abdominal pain on an ongoing basis. He was admitted yesterday. He developed increasing abdominal distention and pain and nausea and vomiting. The night before he eaten several baby carrots. Last bowel movement was the day or morning of admission. Since being admitted a CT scan and x-rays have showed findings consistent with a small bowel obstruction. An NG tube was placed and he is having good NG output, and has improvement but not complete relief of his abdominal distention and pain.  I was asked to assess the patient regarding management of the Crohn's disease, specifically would he be a candidate for balloon dilation of possible ilio colonic stricture. Past Medical History  Diagnosis Date  . Hiatal hernia   . GERD (gastroesophageal reflux disease)   . Gout   . History of pulmonary embolism 2006    from DVT lower extremity  . History of gastritis   . Internal hemorrhoids    . Post-traumatic stress syndrome   . AAA (abdominal aortic aneurysm)   . History of hepatitis C   . B12 deficiency   . History of small bowel obstruction   . Pancytopenia     Hx of  . Crohn's disease   . RLS (restless legs syndrome)   . Hyperoxaluria     Intestinal  . Hypertension   . Nephrolithiasis   . CKD (chronic kidney disease) stage 3, GFR 30-59 ml/min 08/22/2011  . Pancreatitis 2010    elevated lipase and amylase, stranding in tail of pancreas, ? from Humira  . Rosacea conjunctivitis   . PTSD (post-traumatic stress disorder)     Past Surgical History  Procedure Date  . Hernia repair   . Cholecystectomy   . Hemicolectomy     Right  . Bowel resection   . Knee arthroscopy     Left  . Ligament repair     Left knee  . Ankle surgery     Right  . Foot surgery     Right  . Terminal ileum resection     Prior to Admission medications   Medication Sig Start Date End Date Taking? Authorizing Provider  atenolol (TENORMIN) 25 MG tablet Take 25 mg by mouth daily.     Yes Historical Provider, MD  colchicine 0.6 MG tablet Take 0.6 mg by mouth daily.     Yes Historical Provider, MD  cyanocobalamin 1000 MCG tablet Take 100 mcg by mouth every 14 (fourteen) days.     Yes Historical Provider, MD  febuxostat (ULORIC) 40 MG  tablet Take 80 mg by mouth daily.     Yes Historical Provider, MD  fish oil-omega-3 fatty acids 1000 MG capsule Take 1 capsule by mouth 2 (two) times daily.     Yes Historical Provider, MD  gabapentin (NEURONTIN) 100 MG capsule Take 100 mg by mouth 2 (two) times daily.     Yes Historical Provider, MD  Magnesium Oxide 420 MG TABS Take 1 tablet by mouth 2 (two) times daily.    Yes Historical Provider, MD  Multiple Vitamin (MULTIVITAMIN) tablet Take 1 tablet by mouth daily.     Yes Historical Provider, MD  omeprazole (PRILOSEC) 20 MG capsule Take 20 mg by mouth daily.     Yes Historical Provider, MD  PARoxetine (PAXIL) 40 MG tablet Take 40 mg by mouth at bedtime.      Yes Historical Provider, MD  potassium citrate (UROCIT-K) 10 MEQ (1080 MG) SR tablet Take 10 mEq by mouth 2 (two) times daily.   Yes Historical Provider, MD  terazosin (HYTRIN) 2 MG capsule Take 2 mg by mouth at bedtime.    Yes Historical Provider, MD  traZODone (DESYREL) 150 MG tablet Take 150 mg by mouth at bedtime.     Yes Historical Provider, MD    Current Facility-Administered Medications  Medication Dose Route Frequency Provider Last Rate Last Dose  . 0.9 %  sodium chloride infusion   Intravenous Once Otis Brace, PA      . 0.9 %  sodium chloride infusion   Intravenous Continuous Debby Crosley, MD 75 mL/hr at 08/22/11 1556 75 mL/hr at 08/22/11 1556  . colchicine tablet 0.6 mg  0.6 mg Oral Daily Debby Crosley, MD   0.6 mg at 08/22/11 1005  . enoxaparin (LOVENOX) injection 40 mg  40 mg Subcutaneous Q24H Debby Crosley, MD   40 mg at 08/22/11 1005  . HYDROmorphone (DILAUDID) injection 1 mg  1 mg Intravenous Once Otis Brace, PA   1 mg at 08/21/11 1920  . HYDROmorphone (DILAUDID) injection 1 mg  1 mg Intravenous Once Joaquim Lai C. [place], PA   1 mg at 08/21/11 2200  . HYDROmorphone (DILAUDID) injection 2 mg  2 mg Intravenous Q4H PRN Debby Crosley, MD   2 mg at 08/22/11 1057  . morphine 4 MG/ML injection 4 mg  4 mg Intravenous Once Otis Brace, PA   4 mg at 08/21/11 1730  . morphine 4 MG/ML injection 4 mg  4 mg Intravenous Once Otis Brace, PA   4 mg at 08/21/11 1818  . ondansetron (ZOFRAN) injection 4 mg  4 mg Intravenous Once Otis Brace, PA   4 mg at 08/21/11 1730  . ondansetron (ZOFRAN) injection 4 mg  4 mg Intravenous Once Otis Brace, PA   4 mg at 08/21/11 1922  . ondansetron (ZOFRAN) injection 4 mg  4 mg Intravenous Once Joaquim Lai C. [place], [place]   4 mg at 08/21/11 2201  . pantoprazole (PROTONIX) injection 40 mg  40 mg Intravenous QHS Debby Crosley, MD   40 mg at 08/22/11 0123  . pantoprazole (PROTONIX) injection 40 mg  40 mg Intravenous Once Debby Crosley, MD   40 mg at 08/21/11 2200    . PARoxetine (PAXIL) tablet 40 mg  40 mg Oral QHS Christina Rama, MD      . pneumococcal 23 valent vaccine (PNU-IMMUNE) injection 0.5 mL  0.5 mL Intramuscular Tomorrow-1000 Debby Crosley, MD      . promethazine (PHENERGAN) suppository 25 mg  25 mg Rectal  Q6H PRN Quintella Baton, MD      . DISCONTD: 0.9 %  sodium chloride infusion   Intravenous Once Otis Brace, PA        Allergies as of 08/21/2011 - Review Complete 08/21/2011  Allergen Reaction Noted  . Allopurinol  08/21/2011  . Lorazepam      Family History  Problem Relation Age of Onset  . Kidney disease Father   . Colon cancer Neg Hx     History   Social History  . Marital Status: Married            .     Occupational History  . Retired  English as a second language teacher - [place] History Main Topics  . Smoking status: Former Research scientist (life sciences)  . Smokeless tobacco: Not on file  . Alcohol Use: No  . Drug Use: No          Social History Narrative   MarriedVeitnam VeteranDaily caffeineDoes not exercise regularly    Review of Systems: Positive for irritation in the eyes with rosacea. He wears eyeglasses. Recent had problems with bilateral hallux gout. All other review systems are negative or except as mentioned above in history of present illness.  Physical Exam: Vital signs in last 24 hours: Temp:  [97.9 F (36.6 C)-99.9 F (37.7 C)] 97.9 F (36.6 C) (01/19 1410) Pulse Rate:  [67-76] 67  (01/19 1410) Resp:  [18-20] 18  (01/19 1410) BP: (98-114)/(61-77) 98/64 mmHg (01/19 1410) SpO2:  [92 %-99 %] 92 % (01/19 1410) Weight:  [198 lb 3.1 oz (89.9 kg)] 198 lb 3.1 oz (89.9 kg) (01/18 2325) Last BM Date: 08/21/11 General:   Alert,  Well-developed, well-nourished, pleasant and cooperative - mildly acutely ill Head:  Normocephalic and atraumatic. Eyes:  Sclera injected left greater than right with some conjunctival injection, no icterus. Anisocoria with left pupil greater than right, chronic since sharp object injury  Mouth:  No deformity  or lesions.  Oropharynx mucosa is somewhat pale and dry Neck:  Supple; no masses or thyromegaly. Lungs: Faint crackles at the left base, otherwise clear with diffusely decreased breath sounds Heart:  Regular rate and rhythm; no murmurs, clicks, rubs,  or gallops. Somewhat distant Abdomen:  Distended, there is a right lower quadrant vertical scar. He is tender diffusely with guarding..   Msk:  No gouty changes of the feet or toes detected  Extremities:  Without clubbing or edema. Lymph nodes: No cervical, supraclavicular or inguinal adenopathy detected Neurologic:  Alert and  oriented x4;  grossly normal neurologically. Psych:  Alert and cooperative. Normal mood and affect.  Intake/Output from previous day: 01/18 0701 - 01/19 0700 In: 593.8 [I.V.:593.8] Out: 600 [Emesis/NG output:600] Intake/Output this shift: Total I/O In: -  Out: 200 [Emesis/NG output:200]  Lab Results:  Basename 08/22/11 0352 08/21/11 1705  WBC 7.1 5.5  HGB 15.3 15.4  HCT 45.3 43.9  PLT 111* 114*   BMET  Basename 08/22/11 0352 08/21/11 1705  NA 140 139  K 5.0 4.1  CL 105 102  CO2 24 26  GLUCOSE 121* 93  BUN 40* 36*  CREATININE 2.11* 1.93*  CALCIUM 9.1 9.8   Lab Results  Component Value Date   ALT 44 12/09/2010   AST 33 12/09/2010   ALKPHOS 131* 12/09/2010   BILITOT 0.7 12/09/2010     Studies/Results: Ct Abdomen Pelvis Wo Contrast  08/21/2011  *RADIOLOGY REPORT*  Clinical Data: Mid abdominal pain  CT ABDOMEN AND PELVIS WITHOUT CONTRAST  Technique:  Multidetector CT  imaging of the abdomen and pelvis was performed following the standard protocol without intravenous contrast.  Comparison: Abdominal radiograph same date, CT 12/10/2010  Findings: Partially imaged nasogastric tube is coiled upon itself within the distal esophagus, with the tip oriented in a cephalad direction.  Heart size is normal.  No pericardial pleural effusion.  Minimal dependent atelectasis is noted at the lung bases bilaterally.   Cholecystectomy clips noted.  Unenhanced liver, spleen, adrenal glands, pancreas are unremarkable.  Splenic arterial calcification noted without aneurysm allowing for noncontrast technique.  A lobulated contour deformities to both kidneys suggest cysts as previously seen, but some are too small to be definitively characterized.  Nonobstructing 1 mm bilateral renal calculi are noted.  No hydronephrosis.  No radiopaque ureteral or bladder calculi.  No ascites or free air.  Absence or effusion of the right rectus muscle is noted with overlying presumed surgical defect.  There is dilatation of the mid and distal small bowel to the level of the right lower quadrant. There is an ileocolic anastomosis.  Small bowel feces sign is present within the distal small bowel.  The colon is decompressed. No mass lesion is identified allowing for technique.  Transition point to nondilated distal small bowel is identified in the right lower quadrant, image 56, immediately subjacent to the anterior abdominal wall postsurgical change.  Pelvic viscera are unremarkable otherwise.  No acute osseous finding.  IMPRESSION: Nasogastric tube malposition.  Repositioning and re-imaging recommended.  Mid and distal small bowel dilatation to the level of a transition point in the right lower quadrant leading to decompressed distal - most small bowel, subjacent to the anterior abdominal wall postsurgical change, suggesting adhesion as the most likely etiology of this small bowel obstruction.  No free air or fluid.  Original Report Authenticated By: Arline Asp, M.D.   Dg Abd Acute W/chest  08/21/2011  *RADIOLOGY REPORT*  Clinical Data: Question small bowel obstruction.  Abdominal pain, vomiting, and history of small bowel obstruction.  ACUTE ABDOMEN SERIES (ABDOMEN 2 VIEW & CHEST 1 VIEW)  Comparison: Abdomen radiographs 12/11/2010 and 12/10/2010, acute abdominal series 12/08/2010,and CT abdomen pelvis 12/10/2010.  Findings: Stable  borderline cardiomegaly.  Stable mildly ectatic thoracic aorta.  Subsegmental atelectasis versus scar at the left lung base.  Remainder of the lung fields clear.  No visible pleural effusion.  No free intraperitoneal air identified.  Cholecystectomy clips are present.  Surgical suture is seen in the right lower quadrant.  There are multiple scattered air-fluid levels in small bowel loops on the upright view of the abdomen.  On the supine view, there is suggestion of multiple dilated and fluid-filled bowel loops.  No definite dilatation of the colon.  Atherosclerotic calcification of the splenic artery.  No acute or suspicious bony abnormality.  IMPRESSION:  1.  Probable partial small bowel obstruction. 2.  No acute cardiopulmonary disease.  Original Report Authenticated By: Curlene Dolphin, M.D.     Previous Endoscopies: Last colonoscopy 05/15/2008 There was active ileitis in the neoterminal ileum, and at the ileocolonic anastomosis. 2 small tubular adenomas.     Impression / Plan:    #1 small bowel obstruction, imaging is most suggestive of adhesive disease though he could have an ileo-clonic stricture.  #2 Crohn's disease of the small intestine status post ileocolonic resection, active disease on last colonoscopy 2009 but no reports of previous stricture  #3 inability to take 6-MP, immunomodulators, previous history of pancreatitis thought to the Humira   He is responding to the nasogastric  tube suction. Would continue that for the time being. It is not clear to me that he needs dilation of a small bowel stricture because I'm not sure we have proven that. When he recovers from this is certainly seems reasonable to consider having a colonoscopy to sort things out with the possibility of balloon dilation being entertained.  He is on no therapy for his Crohn's disease at this time, I have not initiated steroids yet as she seems to be responding pretty well to as a gastric suction. He had a very  acute onset of his problems within 12 hours of ingesting raw vegetables, namely character. I suspect that had something to do with this problem. He does not have signs of active bowel inflammation such as a Crohn's flare. Thus, I think it's okay to withhold any active Crohn's therapy at this time but that could change depending upon the clinical course.  We will followup again tomorrow and take this day by day as you are. Further plans pending the clinical course.  Long-term, may need to revisit chronic therapy for his Crohn's, though his his previous problems with medications make this difficult. Colonoscopy would help sort this out as well, possibly.   LOS: 1 day   Silvano Rusk  08/22/2011, 4:39 PM    Note that the patient is on normal saline infusion. He is n.p.o., I will change his fluids to D5 normal saline. He needs glucose for calories.  [name], MD, [facility] [phone] (pager) 08/22/2011 5:02 PM

## 2011-08-22 NOTE — Progress Notes (Signed)
Arrived to room 1527 via stretcher, ngt in place to right nare, suction to LCS set up after auscultating placement of NGT in stomach, air noted. Pt stated pain level at a 5 and is comfortable with that level. Encouraged patient to call for pain medicine as we want to keep his pain level under control and also with any other needs. Patient and wife were oriented to room, hospital information booklet given, encouraged to watch medical information on tv, wife acknowledged. Will continue to monitor.

## 2011-08-23 ENCOUNTER — Inpatient Hospital Stay (HOSPITAL_COMMUNITY): Payer: Medicare Other

## 2011-08-23 DIAGNOSIS — K56609 Unspecified intestinal obstruction, unspecified as to partial versus complete obstruction: Secondary | ICD-10-CM | POA: Diagnosis not present

## 2011-08-23 DIAGNOSIS — N189 Chronic kidney disease, unspecified: Secondary | ICD-10-CM | POA: Diagnosis not present

## 2011-08-23 DIAGNOSIS — J984 Other disorders of lung: Secondary | ICD-10-CM | POA: Diagnosis not present

## 2011-08-23 DIAGNOSIS — D61818 Other pancytopenia: Secondary | ICD-10-CM | POA: Diagnosis not present

## 2011-08-23 DIAGNOSIS — K5080 Crohn's disease of both small and large intestine without complications: Secondary | ICD-10-CM | POA: Diagnosis not present

## 2011-08-23 DIAGNOSIS — K5000 Crohn's disease of small intestine without complications: Secondary | ICD-10-CM | POA: Diagnosis not present

## 2011-08-23 MED ORDER — PHENOL 1.4 % MT LIQD
1.0000 | OROMUCOSAL | Status: DC | PRN
  Administered 2011-08-23: 1 via OROMUCOSAL
  Filled 2011-08-23: qty 177

## 2011-08-23 NOTE — Progress Notes (Signed)
PATIENT DETAILS Name: Derek [name] Age: 71 y.o. Sex: male Date of Birth: [birth year] Admit Date: 08/21/2011 [id number] CABOT, MD, [name] Emergency contact:   CONSULTS: 1.  Derek [name], Surgery 2.  Derek [name], Gastroenterology  Interval History: Derek [name] is a 71 year old male with a PMH of Crohn's disease s/p small bowel resection and recurrent SBO who presented to the hospital on 08/21/11 with abdominal pain.  A CT scan of the abdomen done on admission showed SBO.  Surgical consultation was performed on 08/22/11 and was felt to have a possible distal stricture at the ileocolonic anastomosis.    ROS: *Derek [name] had a large BM today, per his report.  He denies nausea and has throat irritation from the NG tube.  Otherwise, denies abdominal pain.  Feels weak.    Objective: Vital signs in last 24 hours: Temp:  [98 F (36.7 C)-98.3 F (36.8 C)] 98 F (36.7 C) (01/20 1350) Pulse Rate:  [71-82] 71  (01/20 1350) Resp:  [18] 18  (01/20 1350) BP: (94-120)/(58-74) 120/74 mmHg (01/20 1350) SpO2:  [91 %-95 %] 95 % (01/20 1350) Weight change:  Last BM Date: 08/21/11  Intake/Output from previous day:  Intake/Output Summary (Last 24 hours) at 08/23/11 1539 Last data filed at 08/23/11 1400  Gross per 24 hour  Intake    900 ml  Output   2251 ml  Net  -1351 ml     Physical Exam:  Gen:  NAD Cardiovascular:  RRR, No M/R/G Respiratory: Lungs CTAB Gastrointestinal: Abdomen distended, NT, no rebound or guarding.  + BS Extremities: No C/E/C     Lab Results: Basic Metabolic Panel:  Lab 81/82/99 0352 08/21/11 1705  NA 140 139  K 5.0 4.1  CL 105 102  CO2 24 26  GLUCOSE 121* 93  BUN 40* 36*  CREATININE 2.11* 1.93*  CALCIUM 9.1 9.8  MG -- --  PHOS -- --   GFR Estimated Creatinine Clearance: 36.8 ml/min (by C-G formula based on Cr of 2.11).  CBC:  Lab 08/22/11 0352 08/21/11 1705  WBC 7.1 5.5  NEUTROABS -- 4.2  HGB 15.3 15.4  HCT 45.3 43.9  MCV  85.0 84.6  PLT 111* 114*    Studies/Results: Ct Abdomen Pelvis Wo Contrast  08/21/2011  *RADIOLOGY REPORT*  Clinical Data: Mid abdominal pain  CT ABDOMEN AND PELVIS WITHOUT CONTRAST  Technique:  Multidetector CT imaging of the abdomen and pelvis was performed following the standard protocol without intravenous contrast.  Comparison: Abdominal radiograph same date, CT 12/10/2010  Findings: Partially imaged nasogastric tube is coiled upon itself within the distal esophagus, with the tip oriented in a cephalad direction.  Heart size is normal.  No pericardial pleural effusion.  Minimal dependent atelectasis is noted at the lung bases bilaterally.  Cholecystectomy clips noted.  Unenhanced liver, spleen, adrenal glands, pancreas are unremarkable.  Splenic arterial calcification noted without aneurysm allowing for noncontrast technique.  A lobulated contour deformities to both kidneys suggest cysts as previously seen, but some are too small to be definitively characterized.  Nonobstructing 1 mm bilateral renal calculi are noted.  No hydronephrosis.  No radiopaque ureteral or bladder calculi.  No ascites or free air.  Absence or effusion of the right rectus muscle is noted with overlying presumed surgical defect.  There is dilatation of the mid and distal small bowel to the level of the right lower quadrant. There is an ileocolic anastomosis.  Small bowel feces sign is present within the distal small  bowel.  The colon is decompressed. No mass lesion is identified allowing for technique.  Transition point to nondilated distal small bowel is identified in the right lower quadrant, image 56, immediately subjacent to the anterior abdominal wall postsurgical change.  Pelvic viscera are unremarkable otherwise.  No acute osseous finding.  IMPRESSION: Nasogastric tube malposition.  Repositioning and re-imaging recommended.  Mid and distal small bowel dilatation to the level of a transition point in the right lower quadrant  leading to decompressed distal - most small bowel, subjacent to the anterior abdominal wall postsurgical change, suggesting adhesion as the most likely etiology of this small bowel obstruction.  No free air or fluid.  Original Report Authenticated By: Arline Asp, M.D.   Dg Abd 2 Views  08/23/2011  *RADIOLOGY REPORT*  Clinical Data: Small bowel obstruction, follow-up  ABDOMEN - 2 VIEW  Comparison: CT and plain radiographs 08/21/2011  Findings: No significant change in multiple differential air-fluid levels since previous exam. Increased apparent retrocardiac airspace opacity is noted.  No free air.  IMPRESSION: No change in multiple differential air-fluid levels most compatible with previously seen small bowel obstruction.  New retrocardiac airspace disease, most likely atelectasis, although developing pneumonia could have a similar appearance.  Original Report Authenticated By: Arline Asp, M.D.   Dg Abd Acute W/chest  08/21/2011  *RADIOLOGY REPORT*  Clinical Data: Question small bowel obstruction.  Abdominal pain, vomiting, and history of small bowel obstruction.  ACUTE ABDOMEN SERIES (ABDOMEN 2 VIEW & CHEST 1 VIEW)  Comparison: Abdomen radiographs 12/11/2010 and 12/10/2010, acute abdominal series 12/08/2010,and CT abdomen pelvis 12/10/2010.  Findings: Stable borderline cardiomegaly.  Stable mildly ectatic thoracic aorta.  Subsegmental atelectasis versus scar at the left lung base.  Remainder of the lung fields clear.  No visible pleural effusion.  No free intraperitoneal air identified.  Cholecystectomy clips are present.  Surgical suture is seen in the right lower quadrant.  There are multiple scattered air-fluid levels in small bowel loops on the upright view of the abdomen.  On the supine view, there is suggestion of multiple dilated and fluid-filled bowel loops.  No definite dilatation of the colon.  Atherosclerotic calcification of the splenic artery.  No acute or suspicious bony  abnormality.  IMPRESSION:  1.  Probable partial small bowel obstruction. 2.  No acute cardiopulmonary disease.  Original Report Authenticated By: Curlene Dolphin, M.D.    Medications: Scheduled Meds:    . colchicine  0.6 mg Oral Daily  . enoxaparin  40 mg Subcutaneous Q24H  . pantoprazole (PROTONIX) IV  40 mg Intravenous QHS  . PARoxetine  40 mg Oral QHS  . pneumococcal 23 valent vaccine  0.5 mL Intramuscular Tomorrow-1000   Continuous Infusions:    . dextrose 5 % and 0.9% NaCl 100 mL/hr (08/23/11 1020)  . DISCONTD: sodium chloride 75 mL/hr (08/22/11 1556)   PRN Meds:.HYDROmorphone (DILAUDID) injection, promethazine Antibiotics: Anti-infectives    None       Assessment/Plan:  Principal Problem:  *SMALL BOWEL OBSTRUCTION The patient was admitted and a diagnostic evaluation confirmed SBO.  Surgical consultation was requested and kindly provided by Dr. Hassell Done, who felt the obstruction was due to a stricture at the ileocolonic anastomosis.  He recommended GI consultation which was kindly provided by Dr. Carlean Purl on 08/22/11.  The patient was placed on bowel rest and an NG tube was placed for gastric decompression.  The tube became dislodged this morning, but has now been replaced.  Repeat abdominal films in the morning. Active Problems:  Gout, unspecified The patient has been given his usual dose of colchicine, with clamping of the NG tube x 30 minutes post dosing.   CORONARY ATHEROSCLEROSIS NATIVE CORONARY ARTERY Does not appear to be on ASA PTA.  Patient states he stopped taking aspirin due to problems with bleeding.  Beta blocker currently on hold.  No c/o chest pain.  CROHN'S DISEASE The patient states that he has not had any recent flares, and that he is not currently on any treatment.  Has been on Humira in the past.  GI consulted.  PULMONARY EMBOLISM, HX OF On prophylactic dose lovenox.  Anxiety Resume paxil to prevent withdrawal phenomenon.  Clamp NG tube x 30 minutes post  dosing.  CKD (chronic kidney disease) stage 3, GFR 30-59 ml/min / ARF Baseline creatinine 1.55 (12/12/10).  Current creatinine elevated over usual baseline.  Likely pre-renal.  Continue to hydrate.  Thrombocytopenia H/O pancytopenia.  Platelet count only mildly low.  Monitor.    LOS: 2 days   Jacquelynn Cree, [name] Pager [phone]  08/23/2011, 3:39 PM

## 2011-08-23 NOTE — Progress Notes (Signed)
Patient ID: Derek [name], male   DOB: 07/12/1941, 71 y.o.   MRN: [id number] [facility] Progress Note  Subjective: Denies pain, no flatus as yet. No vomiting. Ng came out last night. He feels weak,thirsty. His left leg is uncomfortable,twitching some..  Did have a bowel movement this AM after PA saw him [name], MD, [facility]   Objective:  Vital signs in last 24 hours: Temp:  [97.9 F (36.6 C)-98.3 F (36.8 C)] 98.3 F (36.8 C) (01/20 0500) Pulse Rate:  [67-82] 82  (01/20 0500) Resp:  [18] 18  (01/20 0500) BP: (94-107)/(58-68) 107/68 mmHg (01/20 0500) SpO2:  [91 %-92 %] 91 % (01/20 0500) Last BM Date: 08/21/11 General:   Alert,  Well-developed,    in NAD Heart:  Regular rate and rhythm; no murmurs Pulm;clear ant Abdomen:  Soft, mildly distended, BS+  Diffusely tender , no rebound Extremities:  Without edema. Neurologic:  Alert and  oriented x4;  grossly normal neurologically,left leg tremulous Psych:  Alert and cooperative. Normal mood and affect. Agree [name], MD, FACG  Intake/Output from previous day: 01/19 0701 - 01/20 0700 In: 1500 [I.V.:1500] Out: 2950 [Urine:1000; Emesis/NG output:1950] Intake/Output this shift:    Lab Results:  Basename 08/22/11 0352 08/21/11 1705  WBC 7.1 5.5  HGB 15.3 15.4  HCT 45.3 43.9  PLT 111* 114*   BMET  Basename 08/22/11 0352 08/21/11 1705  NA 140 139  K 5.0 4.1  CL 105 102  CO2 24 26  GLUCOSE 121* 93  BUN 40* 36*  CREATININE 2.11* 1.93*  CALCIUM 9.1 9.8          Assessment / Plan: #1 71 yo male with SBO in setting of Crohn's and prior surgery -today's folms show persistent obstructive pattern-will replace NG tube. Obstruction felt most likely adhesive. #2 Acute renal insufficiency-increase fluids #3 Hx PE- On lovenox     LOS: 2 days   Amy Esterwood  08/23/2011, 9:50 AM  I have also seen and assess the patient and made notes above.  He is improving as he has had a bowel movement.  Still getting significant NG drainage. Will continue NG suction and follow-up tomorrow - ? If he could be clamped tomorrow. Timing of further endoscopic evaluation not certain yet - will depend upon clinical course.  [name], MD, [facility] [phone] (pager) 08/23/2011 3:26 PM

## 2011-08-23 NOTE — Progress Notes (Signed)
Subjective: He accidentally pulled the ng tube out last night.  He feels much better.  He denies pain, n/v.  No flatus or BM.  Objective: Vital signs in last 24 hours: Temp:  [97.9 F (36.6 C)-98.3 F (36.8 C)] 98.3 F (36.8 C) (01/20 0500) Pulse Rate:  [67-82] 82  (01/20 0500) Resp:  [18] 18  (01/20 0500) BP: (94-107)/(58-68) 107/68 mmHg (01/20 0500) SpO2:  [91 %-92 %] 91 % (01/20 0500) Last BM Date: 08/21/11  Intake/Output from previous day: 01/19 0701 - 01/20 0700 In: 1500 [I.V.:1500] Out: 2950 [Urine:1000; Emesis/NG output:1950] Intake/Output this shift:    PE: Abd-soft, nontender, few bowel sounds heard  Lab Results:   Basename 08/22/11 0352 08/21/11 1705  WBC 7.1 5.5  HGB 15.3 15.4  HCT 45.3 43.9  PLT 111* 114*   BMET  Basename 08/22/11 0352 08/21/11 1705  NA 140 139  K 5.0 4.1  CL 105 102  CO2 24 26  GLUCOSE 121* 93  BUN 40* 36*  CREATININE 2.11* 1.93*  CALCIUM 9.1 9.8   PT/INR No results found for this basename: LABPROT:2,INR:2 in the last 72 hours Comprehensive Metabolic Panel:    Component Value Date/Time   NA 140 08/22/2011 0352   K 5.0 08/22/2011 0352   CL 105 08/22/2011 0352   CO2 24 08/22/2011 0352   BUN 40* 08/22/2011 0352   CREATININE 2.11* 08/22/2011 0352   GLUCOSE 121* 08/22/2011 0352   CALCIUM 9.1 08/22/2011 0352   AST 33 12/09/2010 0527   ALT 44 12/09/2010 0527   ALKPHOS 131* 12/09/2010 0527   BILITOT 0.7 12/09/2010 0527   PROT 6.0 12/09/2010 0527   ALBUMIN 3.6 12/09/2010 0527     Studies/Results: Ct Abdomen Pelvis Wo Contrast  08/21/2011  *RADIOLOGY REPORT*  Clinical Data: Mid abdominal pain  CT ABDOMEN AND PELVIS WITHOUT CONTRAST  Technique:  Multidetector CT imaging of the abdomen and pelvis was performed following the standard protocol without intravenous contrast.  Comparison: Abdominal radiograph same date, CT 12/10/2010  Findings: Partially imaged nasogastric tube is coiled upon itself within the distal esophagus, with the tip oriented  in a cephalad direction.  Heart size is normal.  No pericardial pleural effusion.  Minimal dependent atelectasis is noted at the lung bases bilaterally.  Cholecystectomy clips noted.  Unenhanced liver, spleen, adrenal glands, pancreas are unremarkable.  Splenic arterial calcification noted without aneurysm allowing for noncontrast technique.  A lobulated contour deformities to both kidneys suggest cysts as previously seen, but some are too small to be definitively characterized.  Nonobstructing 1 mm bilateral renal calculi are noted.  No hydronephrosis.  No radiopaque ureteral or bladder calculi.  No ascites or free air.  Absence or effusion of the right rectus muscle is noted with overlying presumed surgical defect.  There is dilatation of the mid and distal small bowel to the level of the right lower quadrant. There is an ileocolic anastomosis.  Small bowel feces sign is present within the distal small bowel.  The colon is decompressed. No mass lesion is identified allowing for technique.  Transition point to nondilated distal small bowel is identified in the right lower quadrant, image 56, immediately subjacent to the anterior abdominal wall postsurgical change.  Pelvic viscera are unremarkable otherwise.  No acute osseous finding.  IMPRESSION: Nasogastric tube malposition.  Repositioning and re-imaging recommended.  Mid and distal small bowel dilatation to the level of a transition point in the right lower quadrant leading to decompressed distal - most small bowel, subjacent to  the anterior abdominal wall postsurgical change, suggesting adhesion as the most likely etiology of this small bowel obstruction.  No free air or fluid.  Original Report Authenticated By: Arline Asp, M.D.   Dg Abd Acute W/chest  08/21/2011  *RADIOLOGY REPORT*  Clinical Data: Question small bowel obstruction.  Abdominal pain, vomiting, and history of small bowel obstruction.  ACUTE ABDOMEN SERIES (ABDOMEN 2 VIEW & CHEST 1 VIEW)   Comparison: Abdomen radiographs 12/11/2010 and 12/10/2010, acute abdominal series 12/08/2010,and CT abdomen pelvis 12/10/2010.  Findings: Stable borderline cardiomegaly.  Stable mildly ectatic thoracic aorta.  Subsegmental atelectasis versus scar at the left lung base.  Remainder of the lung fields clear.  No visible pleural effusion.  No free intraperitoneal air identified.  Cholecystectomy clips are present.  Surgical suture is seen in the right lower quadrant.  There are multiple scattered air-fluid levels in small bowel loops on the upright view of the abdomen.  On the supine view, there is suggestion of multiple dilated and fluid-filled bowel loops.  No definite dilatation of the colon.  Atherosclerotic calcification of the splenic artery.  No acute or suspicious bony abnormality.  IMPRESSION:  1.  Probable partial small bowel obstruction. 2.  No acute cardiopulmonary disease.  Original Report Authenticated By: Curlene Dolphin, M.D.    Anti-infectives: Anti-infectives    None      Assessment Principal Problem:  *SMALL BOWEL OBSTRUCTION-most likely due to adhesions but could be due to a carrot bezoar Active Problems:  Gout, unspecified  CORONARY ATHEROSCLEROSIS NATIVE CORONARY ARTERY  CROHN'S DISEASE  PULMONARY EMBOLISM, HX OF  Anxiety  CKD (chronic kidney disease) stage 3, GFR 30-59 ml/min  Thrombocytopenia  ARF (acute renal failure)    LOS: 2 days   Plan: Check abdominal x-rays.  If intestine is still dilated or he has n/v, would reinsert ng tube.   Derek [name] 08/23/2011

## 2011-08-23 NOTE — Progress Notes (Signed)
Pt pulled out NG tube. Pt does not want NG tube reinserted until MD rounds. Derek [name]

## 2011-08-24 ENCOUNTER — Inpatient Hospital Stay (HOSPITAL_COMMUNITY): Payer: Medicare Other

## 2011-08-24 DIAGNOSIS — K5000 Crohn's disease of small intestine without complications: Secondary | ICD-10-CM | POA: Diagnosis not present

## 2011-08-24 DIAGNOSIS — K56609 Unspecified intestinal obstruction, unspecified as to partial versus complete obstruction: Secondary | ICD-10-CM | POA: Diagnosis not present

## 2011-08-24 DIAGNOSIS — Z09 Encounter for follow-up examination after completed treatment for conditions other than malignant neoplasm: Secondary | ICD-10-CM | POA: Diagnosis not present

## 2011-08-24 DIAGNOSIS — K5090 Crohn's disease, unspecified, without complications: Secondary | ICD-10-CM

## 2011-08-24 DIAGNOSIS — K5080 Crohn's disease of both small and large intestine without complications: Secondary | ICD-10-CM | POA: Diagnosis not present

## 2011-08-24 DIAGNOSIS — D61818 Other pancytopenia: Secondary | ICD-10-CM | POA: Diagnosis not present

## 2011-08-24 DIAGNOSIS — N189 Chronic kidney disease, unspecified: Secondary | ICD-10-CM | POA: Diagnosis not present

## 2011-08-24 DIAGNOSIS — R109 Unspecified abdominal pain: Secondary | ICD-10-CM | POA: Diagnosis not present

## 2011-08-24 LAB — BASIC METABOLIC PANEL
Chloride: 107 mEq/L (ref 96–112)
GFR calc Af Amer: 48 mL/min — ABNORMAL LOW (ref >90)
Potassium: 3.8 mEq/L (ref 3.5–5.1)

## 2011-08-24 NOTE — Progress Notes (Signed)
CARE MANAGEMENT NOTE 08/24/2011  Patient:  Derek [name], Derek [name]   Account Number:  [id number]  Date Initiated:  08/24/2011  Documentation initiated by:  Trace Cederberg  Subjective/Objective Assessment:   admitted with sbo and hx of crohns     Action/Plan:   lives at home   Anticipated DC Date:  08/27/2011   Anticipated DC Plan:  HOME/SELF CARE  In-house referral  NA      DC Planning Services  NA      [facility] Choice  NA   Choice offered to / List presented to:  NA   DME arranged  NA      DME agency  NA     [place] arranged  NA      [place] agency  NA   Status of service:  In process, will continue to follow Medicare Important Message given?  NA - LOS <3 / Initial given by admissions (If response is "NO", the following Medicare IM given date fields will be blank) Date Medicare IM given:   Date Additional Medicare IM given:    Discharge Disposition:    Per UR Regulation:  Reviewed for med. necessity/level of care/duration of stay  Comments:  [id number]/Jacqueline Spofford,RN,BSn,CCM

## 2011-08-24 NOTE — Progress Notes (Signed)
Agree with clamping NGT.  Improving.

## 2011-08-24 NOTE — Progress Notes (Signed)
PATIENT DETAILS Name: Derek [name] Age: 71 y.o. Sex: male Date of Birth: 05-28-1941 Admit Date: 08/21/2011 GBT:[id number],VPXT CABOT, MD, MD Emergency contact:   CONSULTS: 1.  Dr. Johnathan Hausen, Surgery 2.  Dr. Silvano Rusk, Gastroenterology  Interval History: [name] is a 71 year old male with a PMH of Crohn's disease s/p small bowel resection and recurrent SBO who presented to the hospital on 08/21/11 with abdominal pain.  A CT scan of the abdomen done on admission showed SBO.  Surgical consultation was performed on 08/22/11 and was felt to have a possible distal stricture at the ileocolonic anastomosis.  GI consultation was performed on 08/22/11 as well.  The patient has continued to improve with conservative therapy.  ROS: [name] had a large BM yesterday and another BM today.  He is passing flatus.  He feels better.  No N/V since NGT removed.  Objective: Vital signs in last 24 hours: Temp:  [97.6 F (36.4 C)-98 F (36.7 C)] 98 F (36.7 C) 09-09-2022 1400) Pulse Rate:  [64-71] 71  09-09-2022 1400) Resp:  [16-18] 16  09/09/22 1400) BP: (110-128)/(68-75) 116/74 mmHg 09/09/2022 1400) SpO2:  [94 %-97 %] 97 % 09-09-22 1400) Weight change:  Last BM Date: 2011/09/10  Intake/Output from previous day:  Intake/Output Summary (Last 24 hours) at 09/10/2011 1622 Last data filed at 09/10/2011 0900  Gross per 24 hour  Intake   1500 ml  Output   1250 ml  Net    250 ml     Physical Exam:  Gen:  NAD Cardiovascular:  RRR, No M/R/G Respiratory: Lungs CTAB Gastrointestinal: Abdomen less distended, NT, no rebound or guarding.  + BS Extremities: No C/E/C     Lab Results: Basic Metabolic Panel:  Lab 01/26/93 0402 08/22/11 0352 08/21/11 1705  NA 141 140 139  K 3.8 5.0 --  CL 107 105 102  CO2 25 24 26   GLUCOSE 128* 121* 93  BUN 29* 40* 36*  CREATININE 1.64* 2.11* 1.93*  CALCIUM 8.5 9.1 9.8  MG -- -- --  PHOS -- -- --   GFR Estimated Creatinine Clearance: 47.3 ml/min (by C-G  formula based on Cr of 1.64).  CBC:  Lab 08/22/11 0352 08/21/11 1705  WBC 7.1 5.5  NEUTROABS -- 4.2  HGB 15.3 15.4  HCT 45.3 43.9  MCV 85.0 84.6  PLT 111* 114*    Studies/Results: Dg Abd 2 Views  September 10, 2011  *RADIOLOGY REPORT*  Clinical Data: Small bowel obstruction.  Abdominal pain.  ABDOMEN - 2 VIEW  Comparison: 08/23/2011  Findings: A nasogastric tube is now seen with the tip in the gastric antrum.  Surgical clips and staples again noted in the right upper and lower quadrants.  There is no evidence of free air. Decreased small bowel dilatation and air-fluid levels demonstrated since prior study, consistent with resolving small bowel obstruction.  Persistent pulmonary infiltrate seen in the left lung base.  IMPRESSION:  1.  Resolving small bowel obstruction, with nasogastric tube now seen with tip in gastric antrum. 2.  Persistent left lower lobe infiltrate.  Original Report Authenticated By: Marlaine Hind, M.D.   Dg Abd 2 Views  08/23/2011  *RADIOLOGY REPORT*  Clinical Data: Small bowel obstruction, follow-up  ABDOMEN - 2 VIEW  Comparison: CT and plain radiographs 08/21/2011  Findings: No significant change in multiple differential air-fluid levels since previous exam. Increased apparent retrocardiac airspace opacity is noted.  No free air.  IMPRESSION: No change in multiple differential air-fluid levels most compatible with  previously seen small bowel obstruction.  New retrocardiac airspace disease, most likely atelectasis, although developing pneumonia could have a similar appearance.  Original Report Authenticated By: Arline Asp, M.D.    Medications: Scheduled Meds:    . colchicine  0.6 mg Oral Daily  . enoxaparin  40 mg Subcutaneous Q24H  . pantoprazole (PROTONIX) IV  40 mg Intravenous QHS  . PARoxetine  40 mg Oral QHS  . pneumococcal 23 valent vaccine  0.5 mL Intramuscular Tomorrow-1000   Continuous Infusions:    . dextrose 5 % and 0.9% NaCl 100 mL/hr at 08/24/11 1310    PRN Meds:.HYDROmorphone (DILAUDID) injection, phenol, promethazine Antibiotics: Anti-infectives    None       Assessment/Plan:  Principal Problem:  *SMALL BOWEL OBSTRUCTION The patient was admitted and a diagnostic evaluation confirmed SBO.  Surgical consultation was requested and kindly provided by Dr. Hassell Done, who felt the obstruction was due to a stricture at the ileocolonic anastomosis.  He recommended GI consultation which was kindly provided by Dr. Carlean Purl on 08/22/11.  The patient was placed on bowel rest and an NG tube was placed for gastric decompression.  The tube became dislodged 08/23/11, but was  replaced.  Repeat abdominal films 08/24/11 show improvement in SBO pattern.  NGT was now clamped and subsequently removed on 08/24/11.  His diet has been advanced to CL. Active Problems:  Gout, unspecified The patient has been given his usual dose of colchicine, with clamping of the NG tube x 30 minutes post dosing.   CORONARY ATHEROSCLEROSIS NATIVE CORONARY ARTERY Does not appear to be on ASA PTA.  Patient states he stopped taking aspirin due to problems with bleeding.  Beta blocker currently on hold.  No c/o chest pain.  CROHN'S DISEASE The patient states that he has not had any recent flares, and that he is not currently on any treatment.  Has been on Humira in the past.  S/P GI consultation with recommendations noted.    PULMONARY EMBOLISM, HX OF On prophylactic dose lovenox.  Anxiety Resume paxil to prevent withdrawal phenomenon.  Clamp NG tube x 30 minutes post dosing.  CKD (chronic kidney disease) stage 3, GFR 30-59 ml/min / ARF Baseline creatinine 1.55 (12/12/10).  Current creatinine approaching usual baseline.  Likely pre-renal.  Continue to hydrate.  Thrombocytopenia H/O pancytopenia.  Platelet count only mildly low.  Monitor.    LOS: 3 days   Jacquelynn Cree, MD Pager [phone]  08/24/2011, 4:22 PM

## 2011-08-24 NOTE — Progress Notes (Signed)
[facility] Progress Note  SUBJECTIVE: feels okay. NGT clamped for 2 hours now. Had BM last night and this am.  OBJECTIVE:  Vital signs in last 24 hours: Temp:  [97.6 F (36.4 C)-98 F (36.7 C)] 98 F (36.7 C) 08/29/22 0400) Pulse Rate:  [64-71] 67  2022-08-29 0400) Resp:  [16-18] 16  2022/08/29 0400) BP: (110-128)/(68-75) 110/68 mmHg 2022-08-29 0400) SpO2:  [94 %-95 %] 94 % 08-29-2022 0400) Last BM Date: 08-30-11 General:    white male in NAD Heart:  Regular rate and rhythm Lungs: Respirations even and unlabored Abdomen:  Soft, nontender and nondistended. Normal bowel sounds. Extremities:  Without edema. Neurologic:  Alert and oriented,  grossly normal neurologically. Psych:  Cooperative. Normal mood and affect.   Lab Results:  Basename 08/22/11 0352 08/21/11 1705  WBC 7.1 5.5  HGB 15.3 15.4  HCT 45.3 43.9  PLT 111* 114*   BMET  Basename 08-30-2011 0402 08/22/11 0352 08/21/11 1705  NA 141 140 139  K 3.8 5.0 4.1  CL 107 105 102  CO2 25 24 26   GLUCOSE 128* 121* 93  BUN 29* 40* 36*  CREATININE 1.64* 2.11* 1.93*  CALCIUM 8.5 9.1 9.8   Studies/Results: Dg Abd 2 Views  08/30/11  *RADIOLOGY REPORT*  Clinical Data: Small bowel obstruction.  Abdominal pain.  ABDOMEN - 2 VIEW  Comparison: 08/23/2011  Findings: A nasogastric tube is now seen with the tip in the gastric antrum.  Surgical clips and staples again noted in the right upper and lower quadrants.  There is no evidence of free air. Decreased small bowel dilatation and air-fluid levels demonstrated since prior study, consistent with resolving small bowel obstruction.  Persistent pulmonary infiltrate seen in the left lung base.  IMPRESSION:  1.  Resolving small bowel obstruction, with nasogastric tube now seen with tip in gastric antrum. 2.  Persistent left lower lobe infiltrate.  Original Report Authenticated By: Marlaine Hind, M.D.   Dg Abd 2 Views  08/23/2011  *RADIOLOGY REPORT*  Clinical Data: Small bowel obstruction,  follow-up  ABDOMEN - 2 VIEW  Comparison: CT and plain radiographs 08/21/2011  Findings: No significant change in multiple differential air-fluid levels since previous exam. Increased apparent retrocardiac airspace opacity is noted.  No free air.  IMPRESSION: No change in multiple differential air-fluid levels most compatible with previously seen small bowel obstruction.  New retrocardiac airspace disease, most likely atelectasis, although developing pneumonia could have a similar appearance.  Original Report Authenticated By: Arline Asp, M.D.     ASSESSMENT / PLAN:  1. Recurrent small bowel obstruction, probably secondary to adhesions but an ileocolonic stricture is also a possibility.He is responding to NG decompression Abdominal films today c/w resolving SBO. His NGT was clamped a couple of hours ago, so far no nausea or abdominal pain.  Hopefully NGT can be discontinued in a few hours, and begin clears. Patient due for next colonoscopy in  2014 I believe. Dr. [name], patient's primary gastroenterologist, can decide if there is any need to perform the colonoscopy before previously scheduled date.   2.  Crohn's disease, s/p ileocolonic resection.  Followed closely by Dr. [name]. Patient hasn't been on any treatment for 2 years. He can't take 6MP, it interferes with Allopurinol.Humira may have been cause of acute pancreatitis in the past. No Crohn's symptoms at home. Patient does well in between these episodes of small bowel obstructions.  3. Chronic thrombocytopenia. Spleen not noted to be enlarged on recent imagine. Medication related thrombocytopenia ??  LOS: 3 days   Tye Savoy  08/24/2011, 10:10 AM   GI ATTENDING PATIENT SEEN AND EXAMINED. XRAYS REVIEWED. IMPROVING CLINICALLY AND RADIOGRAPHICALLY. CURRENTLY WITH CLAMPED TUBE. SUSPECT ADHESIVE DISEASE (AS OPPOSED TO ACTIVE CROHNS). DISCUSSED WITH PATIENT AND WIFE. JNP

## 2011-08-24 NOTE — Progress Notes (Signed)
Patient ID: Derek [name], male   DOB: 07-Aug-1940, 71 y.o.   MRN: [id number]    Subjective: Pt feels better today.  Had 2 BMs yesterday and 1 this am.    Objective: Vital signs in last 24 hours: Temp:  [97.6 F (36.4 C)-98 F (36.7 C)] 98 F (36.7 C) (01/21 0400) Pulse Rate:  [64-71] 67  (01/21 0400) Resp:  [16-18] 16  (01/21 0400) BP: (110-128)/(68-75) 110/68 mmHg (01/21 0400) SpO2:  [94 %-95 %] 94 % (01/21 0400) Last BM Date: 08/21/11  Intake/Output from previous day: 2022/09/16 0701 - 01/21 0700 In: 2204.2 [I.V.:2184.2; NG/GT:20] Out: 1251 [Urine:650; Emesis/NG output:600; Stool:1] Intake/Output this shift:    PE: Abd: soft, mild distention, +BS, nontender.  When NGT returned to suction no residual was noted after being clamped for his x-ray this am.  Lab Results:   Basename 08/22/11 0352 08/21/11 1705  WBC 7.1 5.5  HGB 15.3 15.4  HCT 45.3 43.9  PLT 111* 114*   BMET  Basename 08/24/11 0402 08/22/11 0352  NA 141 140  K 3.8 5.0  CL 107 105  CO2 25 24  GLUCOSE 128* 121*  BUN 29* 40*  CREATININE 1.64* 2.11*  CALCIUM 8.5 9.1   PT/INR No results found for this basename: LABPROT:2,INR:2 in the last 72 hours   Studies/Results: Dg Abd 2 Views  09-17-2011  *RADIOLOGY REPORT*  Clinical Data: Small bowel obstruction, follow-up  ABDOMEN - 2 VIEW  Comparison: CT and plain radiographs 08/21/2011  Findings: No significant change in multiple differential air-fluid levels since previous exam. Increased apparent retrocardiac airspace opacity is noted.  No free air.  IMPRESSION: No change in multiple differential air-fluid levels most compatible with previously seen small bowel obstruction.  New retrocardiac airspace disease, most likely atelectasis, although developing pneumonia could have a similar appearance.  Original Report Authenticated By: Arline Asp, M.D.    Anti-infectives: Anti-infectives    None       Assessment/Plan  1. PSBO  Plan: 1. His x-rays  showed air in colon and appears to be resolving PSBO.  He has had several BMs.  I will clamp his NG for only 4 hours.  If he tolerates this, then we will d/c and start on clears.   LOS: 3 days    Londyn Hotard E 08/24/2011

## 2011-08-25 DIAGNOSIS — K5090 Crohn's disease, unspecified, without complications: Secondary | ICD-10-CM | POA: Diagnosis not present

## 2011-08-25 DIAGNOSIS — K5080 Crohn's disease of both small and large intestine without complications: Secondary | ICD-10-CM | POA: Diagnosis not present

## 2011-08-25 DIAGNOSIS — D61818 Other pancytopenia: Secondary | ICD-10-CM | POA: Diagnosis not present

## 2011-08-25 DIAGNOSIS — K56609 Unspecified intestinal obstruction, unspecified as to partial versus complete obstruction: Secondary | ICD-10-CM | POA: Diagnosis not present

## 2011-08-25 DIAGNOSIS — N189 Chronic kidney disease, unspecified: Secondary | ICD-10-CM | POA: Diagnosis not present

## 2011-08-25 LAB — BASIC METABOLIC PANEL
CO2: 24 mEq/L (ref 19–32)
Calcium: 8.7 mg/dL (ref 8.4–10.5)
Chloride: 110 mEq/L (ref 96–112)
GFR calc Af Amer: 53 mL/min — ABNORMAL LOW (ref >90)
Sodium: 142 mEq/L (ref 135–145)

## 2011-08-25 LAB — CBC
HCT: 36.3 % — ABNORMAL LOW (ref 39.0–52.0)
MCV: 86.2 fL (ref 78.0–100.0)
Platelets: 101 10*3/uL — ABNORMAL LOW (ref 150–400)
RBC: 4.21 MIL/uL — ABNORMAL LOW (ref 4.22–5.81)
WBC: 3.5 10*3/uL — ABNORMAL LOW (ref 4.0–10.5)

## 2011-08-25 MED ORDER — POTASSIUM CHLORIDE CRYS ER 20 MEQ PO TBCR
40.0000 meq | EXTENDED_RELEASE_TABLET | Freq: Once | ORAL | Status: DC
  Filled 2011-08-25: qty 2

## 2011-08-25 MED ORDER — PANTOPRAZOLE SODIUM 40 MG PO TBEC
40.0000 mg | DELAYED_RELEASE_TABLET | Freq: Every day | ORAL | Status: DC
Start: 2011-08-25 — End: 2011-08-25
  Filled 2011-08-25: qty 1

## 2011-08-25 NOTE — Discharge Summary (Signed)
Physician Discharge Summary  Patient ID: Derek [name] MRN: [id number] DOB/AGE: [birth year] 71 y.o.  Admit date: 08/21/2011 Discharge date: 08/25/2011  Primary Care Physician:  Purvis Kilts, MD, MD   Discharge Diagnoses:    Present on Admission:  .SMALL BOWEL OBSTRUCTION .CROHN'S DISEASE .CORONARY ATHEROSCLEROSIS NATIVE CORONARY ARTERY .Anxiety .Gout, unspecified .CKD (chronic kidney disease) stage 3, GFR 30-59 ml/min .Thrombocytopenia .ARF (acute renal failure)  Discharge Medications:  Medication List  As of 08/25/2011  4:46 PM   TAKE these medications         atenolol 25 MG tablet   Commonly known as: TENORMIN   Take 25 mg by mouth daily.      colchicine 0.6 MG tablet   Take 0.6 mg by mouth daily.      cyanocobalamin 1000 MCG tablet   Take 100 mcg by mouth every 14 (fourteen) days.      febuxostat 40 MG tablet   Commonly known as: ULORIC   Take 80 mg by mouth daily.      fish oil-omega-3 fatty acids 1000 MG capsule   Take 1 capsule by mouth 2 (two) times daily.      gabapentin 100 MG capsule   Commonly known as: NEURONTIN   Take 100 mg by mouth 2 (two) times daily.      Magnesium Oxide 420 MG Tabs   Take 1 tablet by mouth 2 (two) times daily.      multivitamin tablet   Take 1 tablet by mouth daily.      omeprazole 20 MG capsule   Commonly known as: PRILOSEC   Take 20 mg by mouth daily.      PARoxetine 40 MG tablet   Commonly known as: PAXIL   Take 40 mg by mouth at bedtime.      potassium citrate 10 MEQ (1080 MG) SR tablet   Commonly known as: UROCIT-K   Take 10 mEq by mouth 2 (two) times daily.      terazosin 2 MG capsule   Commonly known as: HYTRIN   Take 2 mg by mouth at bedtime.      traZODone 150 MG tablet   Commonly known as: DESYREL   Take 150 mg by mouth at bedtime.             Disposition and Follow-up: The patient is being discharged home.  He has follow up scheduled with Dr. [name] next month.  Consults:  1. Dr.  Johnathan Hausen, Surgery  2. Dr. Silvano Rusk, Gastroenterology   Significant Diagnostic Studies:  Ct Abdomen Pelvis Wo Contrast  08/21/2011  *RADIOLOGY REPORT*  Clinical Data: Mid abdominal pain  CT ABDOMEN AND PELVIS WITHOUT CONTRAST  Technique:  Multidetector CT imaging of the abdomen and pelvis was performed following the standard protocol without intravenous contrast.  Comparison: Abdominal radiograph same date, CT 12/10/2010  Findings: Partially imaged nasogastric tube is coiled upon itself within the distal esophagus, with the tip oriented in a cephalad direction.  Heart size is normal.  No pericardial pleural effusion.  Minimal dependent atelectasis is noted at the lung bases bilaterally.  Cholecystectomy clips noted.  Unenhanced liver, spleen, adrenal glands, pancreas are unremarkable.  Splenic arterial calcification noted without aneurysm allowing for noncontrast technique.  A lobulated contour deformities to both kidneys suggest cysts as previously seen, but some are too small to be definitively characterized.  Nonobstructing 1 mm bilateral renal calculi are noted.  No hydronephrosis.  No radiopaque ureteral or bladder calculi.  No ascites or free  air.  Absence or effusion of the right rectus muscle is noted with overlying presumed surgical defect.  There is dilatation of the mid and distal small bowel to the level of the right lower quadrant. There is an ileocolic anastomosis.  Small bowel feces sign is present within the distal small bowel.  The colon is decompressed. No mass lesion is identified allowing for technique.  Transition point to nondilated distal small bowel is identified in the right lower quadrant, image 56, immediately subjacent to the anterior abdominal wall postsurgical change.  Pelvic viscera are unremarkable otherwise.  No acute osseous finding.  IMPRESSION: Nasogastric tube malposition.  Repositioning and re-imaging recommended.  Mid and distal small bowel dilatation to the  level of a transition point in the right lower quadrant leading to decompressed distal - most small bowel, subjacent to the anterior abdominal wall postsurgical change, suggesting adhesion as the most likely etiology of this small bowel obstruction.  No free air or fluid.  Original Report Authenticated By: Arline Asp, M.D.   Dg Abd 2 Views  08/24/2011  *RADIOLOGY REPORT*  Clinical Data: Small bowel obstruction.  Abdominal pain.  ABDOMEN - 2 VIEW  Comparison: 08/23/2011  Findings: A nasogastric tube is now seen with the tip in the gastric antrum.  Surgical clips and staples again noted in the right upper and lower quadrants.  There is no evidence of free air. Decreased small bowel dilatation and air-fluid levels demonstrated since prior study, consistent with resolving small bowel obstruction.  Persistent pulmonary infiltrate seen in the left lung base.  IMPRESSION:  1.  Resolving small bowel obstruction, with nasogastric tube now seen with tip in gastric antrum. 2.  Persistent left lower lobe infiltrate.  Original Report Authenticated By: Marlaine Hind, M.D.   Dg Abd 2 Views  08/23/2011  *RADIOLOGY REPORT*  Clinical Data: Small bowel obstruction, follow-up  ABDOMEN - 2 VIEW  Comparison: CT and plain radiographs 08/21/2011  Findings: No significant change in multiple differential air-fluid levels since previous exam. Increased apparent retrocardiac airspace opacity is noted.  No free air.  IMPRESSION: No change in multiple differential air-fluid levels most compatible with previously seen small bowel obstruction.  New retrocardiac airspace disease, most likely atelectasis, although developing pneumonia could have a similar appearance.  Original Report Authenticated By: Arline Asp, M.D.   Dg Abd Acute W/chest  08/21/2011  *RADIOLOGY REPORT*  Clinical Data: Question small bowel obstruction.  Abdominal pain, vomiting, and history of small bowel obstruction.  ACUTE ABDOMEN SERIES (ABDOMEN 2 VIEW &  CHEST 1 VIEW)  Comparison: Abdomen radiographs 12/11/2010 and 12/10/2010, acute abdominal series 12/08/2010,and CT abdomen pelvis 12/10/2010.  Findings: Stable borderline cardiomegaly.  Stable mildly ectatic thoracic aorta.  Subsegmental atelectasis versus scar at the left lung base.  Remainder of the lung fields clear.  No visible pleural effusion.  No free intraperitoneal air identified.  Cholecystectomy clips are present.  Surgical suture is seen in the right lower quadrant.  There are multiple scattered air-fluid levels in small bowel loops on the upright view of the abdomen.  On the supine view, there is suggestion of multiple dilated and fluid-filled bowel loops.  No definite dilatation of the colon.  Atherosclerotic calcification of the splenic artery.  No acute or suspicious bony abnormality.  IMPRESSION:  1.  Probable partial small bowel obstruction. 2.  No acute cardiopulmonary disease.  Original Report Authenticated By: Curlene Dolphin, M.D.    Discharge Laboratory Values: Basic Metabolic Panel:  Lab 23/55/73 0412 08/24/11  0402 08/22/11 0352 08/21/11 1705  NA 142 141 140 139  K 3.3* 3.8 -- --  CL 110 107 105 102  CO2 24 25 24 26   GLUCOSE 114* 128* 121* 93  BUN 16 29* 40* 36*  CREATININE 1.52* 1.64* 2.11* 1.93*  CALCIUM 8.7 8.5 9.1 9.8  MG -- -- -- --  PHOS -- -- -- --   GFR Estimated Creatinine Clearance: 51.1 ml/min (by C-G formula based on Cr of 1.52).  CBC:  Lab 08/25/11 0412 08/22/11 0352 08/21/11 1705  WBC 3.5* 7.1 5.5  NEUTROABS -- -- 4.2  HGB 12.0* 15.3 15.4  HCT 36.3* 45.3 43.9  MCV 86.2 85.0 84.6  PLT 101* 111* 114*     Brief H and P: For complete details please refer to admission H and P, but in brief, Mr. Vanoverbeke is a 71 year old male with a PMH of Crohn's disease s/p small bowel resection and recurrent SBO who presented to the hospital on 08/21/11 with abdominal pain. A CT scan of the abdomen done on admission showed SBO.    Physical Exam at Discharge: BP  117/72  Pulse 70  Temp(Src) 98.6 F (37 C) (Oral)  Resp 18  Ht 6' (1.829 m)  Wt 89.9 kg (198 lb 3.1 oz)  BMI 26.88 kg/m2  SpO2 98% Gen:  NAD Cardiovascular:  RRR, No M/R/G Respiratory: Lungs CTAB Gastrointestinal: Abdomen soft, NT/ND with normal active bowel sounds. Extremities: No C/E/C     Hospital Course:  Principal Problem:  *SMALL BOWEL OBSTRUCTION  The patient was admitted and a diagnostic evaluation confirmed SBO. Surgical consultation was requested and kindly provided by Dr. Hassell Done, who felt the obstruction was due to a stricture at the ileocolonic anastomosis. He recommended GI consultation which was kindly provided by Dr. Carlean Purl on 08/22/11. The patient was placed on bowel rest and an NG tube was placed for gastric decompression. The tube became dislodged 08/23/11, but was replaced. Repeat abdominal films 08/24/11 show improvement in SBO pattern. NGT was now clamped and subsequently removed on 08/24/11. His diet was gradually advanced, which he tolerated well, and he was deemed stable for d/c by GI on 08/25/11.    Active Problems:  Gout, unspecified  The patient has been given his usual dose of colchicine, with clamping of the NG tube x 30 minutes post dosing.  CORONARY ATHEROSCLEROSIS NATIVE CORONARY ARTERY  Does not appear to be on ASA PTA. Patient states he stopped taking aspirin due to problems with bleeding. Resume beta blocker at discharge. CROHN'S DISEASE  The patient states that he has not had any recent flares, and that he is not currently on any treatment. Has been on Humira in the past. S/P GI consultation with recommendations noted. He has follow up scheduled with Dr. [name]. PULMONARY EMBOLISM, HX OF  Was maintained on prophylactic dose lovenox.  Anxiety  Maintained on paxil. CKD (chronic kidney disease) stage 3, GFR 30-59 ml/min / ARF  Baseline creatinine 1.55 (12/12/10). Current creatinine back to baseline with IV hydration. Thrombocytopenia  H/O pancytopenia.  Platelet count only mildly low. F/U with PCP.   Diet:  Heart healthy  Activity:  Increase activity slowly  Condition at Discharge:   Improved  Time spent on Discharge:  35 minutes  Signed: Dr. Margreta Journey Ramces Shomaker Pager [phone] 08/25/2011, 4:46 PM

## 2011-08-25 NOTE — Progress Notes (Signed)
Patient ID: Derek [name], male   DOB: July 26, 1941, 71 y.o.   MRN: [id number]    Subjective: Pt feels ok.  Tolerating clear liquids.  +BMs  Objective: Vital signs in last 24 hours: Temp:  [98 F (36.7 C)-98.6 F (37 C)] 98.3 F (36.8 C) (01/22 0616) Pulse Rate:  [62-71] 62  (01/22 0616) Resp:  [16] 16  (01/22 0616) BP: (116-139)/(73-75) 139/73 mmHg (01/22 0616) SpO2:  [96 %-98 %] 96 % (01/22 0616) Last BM Date: 08/25/11  Intake/Output from previous day: 08-24-2022 0701 - 01/22 0700 In: 2825 [P.O.:290; I.V.:2535] Out: -  Intake/Output this shift: Total I/O In: 240 [P.O.:240] Out: -   PE: Abd: soft, NT, ND, +BS Heart: regular Lungs: CTAB  Lab Results:   Basename 08/25/11 0412  WBC 3.5*  HGB 12.0*  HCT 36.3*  PLT 101*   BMET  Basename 08/25/11 0412 2011/08/25 0402  NA 142 141  K 3.3* 3.8  CL 110 107  CO2 24 25  GLUCOSE 114* 128*  BUN 16 29*  CREATININE 1.52* 1.64*  CALCIUM 8.7 8.5   PT/INR No results found for this basename: LABPROT:2,INR:2 in the last 72 hours   Studies/Results: Dg Abd 2 Views  08-25-11  *RADIOLOGY REPORT*  Clinical Data: Small bowel obstruction.  Abdominal pain.  ABDOMEN - 2 VIEW  Comparison: 08/23/2011  Findings: A nasogastric tube is now seen with the tip in the gastric antrum.  Surgical clips and staples again noted in the right upper and lower quadrants.  There is no evidence of free air. Decreased small bowel dilatation and air-fluid levels demonstrated since prior study, consistent with resolving small bowel obstruction.  Persistent pulmonary infiltrate seen in the left lung base.  IMPRESSION:  1.  Resolving small bowel obstruction, with nasogastric tube now seen with tip in gastric antrum. 2.  Persistent left lower lobe infiltrate.  Original Report Authenticated By: Derek [name], M.D.   Dg Abd 2 Views  08/23/2011  *RADIOLOGY REPORT*  Clinical Data: Small bowel obstruction, follow-up  ABDOMEN - 2 VIEW  Comparison: CT and plain  radiographs 08/21/2011  Findings: No significant change in multiple differential air-fluid levels since previous exam. Increased apparent retrocardiac airspace opacity is noted.  No free air.  IMPRESSION: No change in multiple differential air-fluid levels most compatible with previously seen small bowel obstruction.  New retrocardiac airspace disease, most likely atelectasis, although developing pneumonia could have a similar appearance.  Original Report Authenticated By: Derek Asp, M.D.    Anti-infectives: Anti-infectives    None       Assessment/Plan  1. PSBO, resolved  Plan: 1. Will advance diet as tolerates.  Fulls for breakfast and solid diet for lunch.  If he tolerates this then he can be d/c from our standpoint. 2. No surgical follow up needed   LOS: 4 days    Derek [name] E 08/25/2011

## 2011-08-25 NOTE — Progress Notes (Signed)
Improving.  Increasing diet.

## 2011-08-25 NOTE — Progress Notes (Signed)
[facility] Progress Note  SUBJECTIVE: NGT out, feels fine. No nausea, distention or abdominal pain. Tolerating full liquids  OBJECTIVE:  Vital signs in last 24 hours: Temp:  [98 F (36.7 C)-98.6 F (37 C)] 98.3 F (36.8 C) (01/22 0616) Pulse Rate:  [62-71] 62  (01/22 0616) Resp:  [16] 16  (01/22 0616) BP: (116-139)/(73-75) 139/73 mmHg (01/22 0616) SpO2:  [96 %-98 %] 96 % (01/22 0616) Last BM Date: 09/05/2011 General:    white male in NAD Heart:  Regular rate and rhythm Lungs: Respirations even and unlabored, lungs CTA bilaterally Abdomen:  Soft, nontender and nondistended. Normal bowel sounds. Extremities:  Without edema. Neurologic:  Alert and oriented,  grossly normal neurologically. Psych:  Cooperative. Normal mood and affect.   Lab Results:  [facility] 08/25/11 0412  WBC 3.5*  HGB 12.0*  HCT 36.3*  PLT 101*   BMET  Basename 08/25/11 0412 09-05-2011 0402  NA 142 141  K 3.3* 3.8  CL 110 107  CO2 24 25  GLUCOSE 114* 128*  BUN 16 29*  CREATININE 1.52* 1.64*  CALCIUM 8.7 8.5   Studies/Results: Dg Abd 2 Views  2011-09-05  *RADIOLOGY REPORT*  Clinical Data: Small bowel obstruction.  Abdominal pain.  ABDOMEN - 2 VIEW  Comparison: 08/23/2011  Findings: A nasogastric tube is now seen with the tip in the gastric antrum.  Surgical clips and staples again noted in the right upper and lower quadrants.  There is no evidence of free air. Decreased small bowel dilatation and air-fluid levels demonstrated since prior study, consistent with resolving small bowel obstruction.  Persistent pulmonary infiltrate seen in the left lung base.  IMPRESSION:  1.  Resolving small bowel obstruction, with nasogastric tube now seen with tip in gastric antrum. 2.  Persistent left lower lobe infiltrate.  Original Report Authenticated By: Marlaine Hind, M.D.    ASSESSMENT / PLAN:  1. Recurrent small bowel obstruction, resolving. Obstruction felt to be secondary to adhesions or maybe an  ileocolonic stricture. Doubt active Crohn's. Can try low residue diet. Patient due for next colonoscopy in 2014 I believe. Dr. [name], patient's primary gastroenterologist, can decide if there is any need to perform the colonoscopy before previously scheduled date. I have scheduled patient a hospital follow up with Dr. [name] on 09/11/11 at 3:30pm 2. Crohn's disease, s/p ileocolonic resection. Followed closely by Dr. [name]. Patient hasn't been on any treatment for 2 years. He can't take 6MP, it interferes with Allopurinol.Humira may have been cause of acute pancreatitis in the past. No Crohn's symptoms at home. Patient does well in between these episodes of small bowel obstructions.  3. Chronic thrombocytopenia, stable. No splenomegaly noted on recent imaging. Medication related thrombocytopenia ??     LOS: 4 days   Tye Savoy  08/25/2011, 10:26 AM   GI ATTENDING  SEEN AND EXAMINED MYSELF. LOOKS FINE. BENIGN ABDOMEN W/O NGT. SHOULD BE READY FOR D/C LATER TODAY. OUT PT F/U DR BRODIE.  Docia Chuck. Geri Seminole., M.D. [facility] Division of Gastroenterology

## 2011-08-25 NOTE — Progress Notes (Signed)
Pt is alert and oriented, vital signs are stable, discharge instructions reviewede with pt and wife, pt refused pneumo vaccine Means, Derek [name] N 08-25-11 17:20pm

## 2011-09-02 ENCOUNTER — Encounter: Payer: Self-pay | Admitting: *Deleted

## 2011-09-11 ENCOUNTER — Ambulatory Visit (INDEPENDENT_AMBULATORY_CARE_PROVIDER_SITE_OTHER): Payer: Medicare Other | Admitting: Internal Medicine

## 2011-09-11 ENCOUNTER — Encounter: Payer: Self-pay | Admitting: Internal Medicine

## 2011-09-11 VITALS — BP 104/60 | HR 60 | Ht 72.0 in | Wt 199.4 lb

## 2011-09-11 DIAGNOSIS — K56609 Unspecified intestinal obstruction, unspecified as to partial versus complete obstruction: Secondary | ICD-10-CM

## 2011-09-11 DIAGNOSIS — K5090 Crohn's disease, unspecified, without complications: Secondary | ICD-10-CM

## 2011-09-11 DIAGNOSIS — K5000 Crohn's disease of small intestine without complications: Secondary | ICD-10-CM | POA: Diagnosis not present

## 2011-09-11 MED ORDER — PEG-KCL-NACL-NASULF-NA ASC-C 100 G PO SOLR: 1.0000 | Freq: Once | ORAL | Status: DC

## 2011-09-11 NOTE — Patient Instructions (Signed)
You have been scheduled for a colonoscopy with propofol. Please follow written instructions given to you at your visit today.  Please pick up your prep kit at the pharmacy within the next 2-3 days.   Low Fiber and Residue Restricted Diet A low fiber diet restricts foods that contain carbohydrates that are not digested in the small intestine. A diet containing about 10 g of fiber is considered low fiber. The diet needs to be individualized to suit patient tolerances and preferences and to avoid unnecessary restrictions. Generally, the foods emphasized in a low fiber diet have no skins or seeds. They may have been processed to remove bran, germ, or husks. Cooking may not necessarily eliminate the fiber. Cooking may, in fact, enable a greater quantity of fiber to be consumed in a lesser volume. Legumes and nuts are also restricted. The term low residue has also been used to describe low fiber diets, although the two are not the same. Residue refers to any substance that adds to bowel (colonic) contents, such as sloughed cells and intestinal bacteria, in addition to fiber. Residue-containing foods, prunes and prune juice, milk, and connective tissue from meats may also need to be eliminated. It is important to eliminate these foods during sudden (acute) attacks of inflammatory bowel disease, when there is a partial obstruction due to another reason, or when minimal fecal output is desired. When these problems are gone, a more normal diet may be used. PURPOSE  Prevent blockage of a partially obstructed or narrowed gastrointestinal tract.   Reduce stool weight and volume.   Slow the movement of waste.  WHEN IS THIS DIET USED?  Acute phase of Crohn's disease, ulcerative colitis, regional enteritis, or diverticulitis.   Narrowing (stenosis) of intestinal or esophageal tubes (lumina).   Transitional diet following surgery, injury (trauma), or illness.  ADEQUACY This diet is nutritionally adequate based  on individual food choices according to the Recommended Dietary Allowances of the [employer]. CHOOSING FOODS Check labels, especially on foods from the starch list. Often, dietary fiber content is listed with the Nutrition Facts panel.  Breads and Starches  Allowed: White, [place], and pita breads, plain rolls, buns, or sweet rolls, doughnuts, waffles, pancakes, bagels. Plain muffins, sweet breads, biscuits, matzoth. Flour. Soda, saltine, or graham crackers. Pretzels, rusks, melba toast, zwieback. Cooked cereals: cornmeal, farina, cream cereals. Dry cereals: refined corn, wheat, rice, and oat cereals (check label). Potatoes prepared any way without skins, refined macaroni, spaghetti, noodles, refined rice.   Avoid: Bread, rolls, or crackers made with whole-wheat, multigrains, rye, bran seeds, nuts, or coconut. Corn tortillas, table-shells. Corn chips, tortilla chips. Cereals containing whole-grains, multigrains, bran, coconut, nuts, or raisins. Cooked or dry oatmeal. Coarse wheat cereals, granola. Cereals advertised as "high fiber." Potato skins. Whole-grain pasta, wild or brown rice. Popcorn.  Vegetables  Allowed:  Strained tomato and vegetable juices. Fresh: tender lettuce, cucumber, cabbage, spinach, bean sprouts. Cooked, canned: asparagus, bean sprouts, cut green or wax beans, cauliflower, pumpkin, beets, mushrooms, olives, spinach, yellow squash, tomato, tomato sauce (no seeds), zucchini (peeled), turnips. Canned sweet potatoes. Small amounts of celery, onion, radish, and green pepper may be used. Keep servings limited to  cup.   Avoid: Fresh, cooked, or canned: artichokes, baked beans, beet greens, broccoli, Brussels sprouts, French-style green beans, corn, kale, legumes, peas, sweet potatoes. Cooked: green or red cabbage, spinach. Avoid large servings of any vegetables.  Fruit  Allowed:  All fruit juices except prune juice. Cooked or canned: apricots applesauce, cantaloupe,  cherries,  grapefruit, grapes, kiwi, mandarin oranges, peaches, pears, fruit cocktail, pineapple, plums, watermelon. Fresh: banana, grapes, cantaloupe, avocado, cherries, pineapple, grapefruit, kiwi, nectarines, peaches, oranges, blueberries, plums. Keep servings limited to  cup or 1 piece.   Avoid: Fresh: apple with or without skin, apricots, mango, pears, raspberries, strawberries. Prune juice, stewed or dried prunes. Dried fruits, raisins, dates. Avoid large servings of all fresh fruits.  Meat and Meat Substitutes  Allowed:  Ground or well-cooked tender beef, ham, veal, lamb, pork, or poultry. Eggs, plain cheese. Fish, oysters, shrimp, lobster, other seafood. Liver, organ meats.   Avoid: Tough, fibrous meats with gristle. Peanut butter, smooth or chunky. Cheese with seeds, nuts, or other foods not allowed. Nuts, seeds, legumes, dried peas, beans, lentils.  Milk  Allowed:  All milk products except those not allowed. Milk and milk product consumption should be minimal when low residue is desired.   Avoid: Yogurt that contains nuts or seeds.  Soups and Combination Foods  Allowed:  Bouillon, broth, or cream soups made from allowed foods. Any strained soup. Casseroles or mixed dishes made with allowed foods.   Avoid: Soups made from vegetables that are not allowed or that contain other foods not allowed.  Desserts and Sweets  Allowed:  Plain cakes and cookies, pie made with allowed fruit, pudding, custard, cream pie. Gelatin, fruit, ice, sherbet, frozen ice pops. Ice cream, ice milk without nuts. Plain hard candy, honey, jelly, molasses, syrup, sugar, chocolate syrup, gumdrops, marshmallows.   Avoid: Desserts, cookies, or candies that contain nuts, peanut butter, or dried fruits. Jams, preserves with seeds, marmalade.  Fats and Oils  Allowed:  Margarine, butter, cream, mayonnaise, salad oils, plain salad dressings made from allowed foods. Plain gravy, crisp bacon without rind.   Avoid:  Seeds, nuts, olives. Avocados.  Beverages  Allowed:  All, except those listed to avoid.   Avoid: Fruit juices with high pulp, prune juice.  Condiments  Allowed:  Ketchup, mustard, horseradish, vinegar, cream sauce, cheese sauce, cocoa powder. Spices in moderation: allspice, basil, bay leaves, celery powder or leaves, cinnamon, cumin powder, curry powder, ginger, mace, marjoram, onion or garlic powder, oregano, paprika, parsley flakes, ground pepper, rosemary, sage, savory, tarragon, thyme, turmeric.   Avoid: Coconut, pickles.  SAMPLE MEAL PLAN The following menu is provided as a sample. Your daily menu plans will vary. Be sure to include a minimum of the following each day in order to provide essential nutrients for the adult:  Starch/Bread/Cereal Group, 6 servings.   Fruit/Vegetable Group, 5 servings.   Meat/Meat Substitute Group, 2 servings.   Milk/Milk Substitute Group, 2 servings.  A serving is equal to  cup for fruits, vegetables, and cooked cereals or 1 piece for foods such as a piece of bread, 1 orange, or 1 apple. For dry cereals and crackers, use serving sizes listed on the label. Combination foods may count as full or partial servings from various food groups. Fats, desserts, and sweets may be added to the meal plan after the requirements for essential nutrients are met. SAMPLE MENU Breakfast   cup orange juice.   1 boiled egg.   1 slice white toast.   Margarine.    cup cornflakes.   1 cup milk.   Beverage.  Lunch   cup chicken noodle soup.   2 to 3 oz sliced roast beef.   2 slices seedless rye bread.   Mayonnaise.    cup tomato juice.   1 small banana.   Beverage.  Dinner  3 oz baked  chicken.    cup scalloped potatoes.    cup cooked beets.   White dinner roll.   Margarine.    cup canned peaches.   Beverage.  Document Released: 01/09/2002 Document Revised: 04/01/2011 Document Reviewed: 12/03/2008 [facility] Patient Information  2012 [employer].  CC: Dr Sharilyn Sites

## 2011-09-11 NOTE — Progress Notes (Signed)
Derek [name] [birth year] MRN [id number]   History of Present Illness:  This is a 71 year old white male with Crohn's disease who is post hospitalization for recurrent small bowel obstruction which resolved on NG suction. A prior SB obstruction occurred in 2010. His recent CT scan of the abdomen showed a dilated mid and distal small bowel with transition point in the lower right quadrant. He is post terminal ileum resection for Crohn's disease in 1980 when he presented with an inflammatory mass. He iscurrently doing well and denies abdominal pain or diarrhea. He has active Crohn's disease at the ileo-colic anastomosis. He had complications from Humira when he developed pancreatitis after the induction dose.Marland Kitchen He is unable to take immunomodulators due interference with medications for gout. His last colonoscopy in October 2009 showed active Crohn's disease at the ileocolic anastomosis, with widely patent anastomosis and 3 mm polyp which was a tubular adenoma. There is a history of hepatitis C with negative viral RNA by PCR. He has post traumatic stress syndrome, chronic insufficiency creat.1.8 and 4.9 cm aortic aneurysm which is followed by Dr Dannielle Burn.   Past Medical History  Diagnosis Date  . Hiatal hernia   . GERD (gastroesophageal reflux disease)   . Gout   . History of pulmonary embolism 2006    from DVT lower extremity  . History of gastritis   . Internal hemorrhoids   . Post-traumatic stress syndrome   . AAA (abdominal aortic aneurysm)   . History of hepatitis C   . B12 deficiency   . History of small bowel obstruction   . Pancytopenia     Hx of  . Crohn's disease   . RLS (restless legs syndrome)   . Hyperoxaluria     Intestinal  . Hypertension   . Nephrolithiasis   . CKD (chronic kidney disease) stage 3, GFR 30-59 ml/min 08/22/2011  . Pancreatitis 2010    elevated lipase and amylase, stranding in tail of pancreas, ? from Humira  . Rosacea conjunctivitis   . PTSD  (post-traumatic stress disorder)   . Small bowel obstruction   . Anxiety   . Thrombocytopenia    Past Surgical History  Procedure Date  . Hernia repair   . Cholecystectomy   . Hemicolectomy     Right  . Bowel resection   . Knee arthroscopy     Left  . Ligament repair     Left knee  . Ankle surgery     Right  . Foot surgery     Right  . Terminal ileum resection     reports that he has quit smoking. He has never used smokeless tobacco. He reports that he does not drink alcohol or use illicit drugs. family history includes Kidney disease in his father.  There is no history of Colon cancer. Allergies  Allergen Reactions  . Allopurinol   . Lorazepam     REACTION: hallucination        Review of Systems:Denies abdominal pain, shortness of breath, dysphagia or heartburn  The remainder of the 10 point ROS is negative except as outlined in H&P   Physical Exam: General appearance  Well developed, in no distress. Eyes- non icteric. HEENT nontraumatic, normocephalic. Mouth no lesions, tongue papillated, no cheilosis. Neck supple without adenopathy, thyroid not enlarged, no carotid bruits, no JVD. Lungs Clear to auscultation bilaterally. Cor normal S1, normal S2, regular rhythm, no murmur,  quiet precordium. Abdomen: Soft nontender abdomen with well-healed surgical scars in the right middle quadrant. Normal active  bowel sounds. No distention.  Rectal: Extremities no pedal edema. Skin no lesions. Neurological alert and oriented x 3. Psychological normal mood and affect.  Assessment and Plan:  Problem #1 Status post recurrent small bowel obstruction which has resolved with nasogastric suction. A CT scan indicates a partial obstruction to be in the distal small bowel at the level of the anastomosis. His last colonoscopy 3 years ago did not show any obstruction at the anastomosis but it showed active Crohn's disease.Marland Kitchen He either has  a  stricture or an adhesive disease in  thedistal small bowel with transition point in the RLQ.Marland Kitchen When he was in the hospital it was suggested to him to undergo a repeat colonoscopy and  a balloon dilatation of the ileocolic anastomosis if indicated before considering surgery. He has been having partial small bowel obstructions at least once a year and he would like to take a more aggressive approach to this to prevent further attacks. He has not been following low residue diet like he was instructed. The last episode occurred after he ate raw carrots. We will schedule a colonoscopy with balloon dilatation at this time.   09/11/2011 Derek [name]

## 2011-09-12 ENCOUNTER — Encounter: Payer: Self-pay | Admitting: Internal Medicine

## 2011-10-05 DIAGNOSIS — N2581 Secondary hyperparathyroidism of renal origin: Secondary | ICD-10-CM | POA: Diagnosis not present

## 2011-10-05 DIAGNOSIS — M109 Gout, unspecified: Secondary | ICD-10-CM | POA: Diagnosis not present

## 2011-10-05 DIAGNOSIS — I129 Hypertensive chronic kidney disease with stage 1 through stage 4 chronic kidney disease, or unspecified chronic kidney disease: Secondary | ICD-10-CM | POA: Diagnosis not present

## 2011-10-05 DIAGNOSIS — E213 Hyperparathyroidism, unspecified: Secondary | ICD-10-CM | POA: Diagnosis not present

## 2011-10-05 DIAGNOSIS — D649 Anemia, unspecified: Secondary | ICD-10-CM | POA: Diagnosis not present

## 2011-10-05 DIAGNOSIS — N183 Chronic kidney disease, stage 3 unspecified: Secondary | ICD-10-CM | POA: Diagnosis not present

## 2011-11-04 ENCOUNTER — Ambulatory Visit (AMBULATORY_SURGERY_CENTER): Payer: Medicare Other | Admitting: Internal Medicine

## 2011-11-04 ENCOUNTER — Encounter: Payer: Self-pay | Admitting: Internal Medicine

## 2011-11-04 VITALS — BP 118/63 | HR 70 | Temp 96.0°F | Resp 23 | Ht 72.0 in | Wt 199.0 lb

## 2011-11-04 DIAGNOSIS — I2510 Atherosclerotic heart disease of native coronary artery without angina pectoris: Secondary | ICD-10-CM | POA: Diagnosis not present

## 2011-11-04 DIAGNOSIS — Z86010 Personal history of colonic polyps: Secondary | ICD-10-CM

## 2011-11-04 DIAGNOSIS — F411 Generalized anxiety disorder: Secondary | ICD-10-CM | POA: Diagnosis not present

## 2011-11-04 DIAGNOSIS — I10 Essential (primary) hypertension: Secondary | ICD-10-CM | POA: Diagnosis not present

## 2011-11-04 DIAGNOSIS — D649 Anemia, unspecified: Secondary | ICD-10-CM | POA: Diagnosis not present

## 2011-11-04 DIAGNOSIS — D1330 Benign neoplasm of unspecified part of small intestine: Secondary | ICD-10-CM | POA: Diagnosis not present

## 2011-11-04 DIAGNOSIS — K219 Gastro-esophageal reflux disease without esophagitis: Secondary | ICD-10-CM | POA: Diagnosis not present

## 2011-11-04 DIAGNOSIS — K5090 Crohn's disease, unspecified, without complications: Secondary | ICD-10-CM | POA: Diagnosis not present

## 2011-11-04 DIAGNOSIS — K56609 Unspecified intestinal obstruction, unspecified as to partial versus complete obstruction: Secondary | ICD-10-CM

## 2011-11-04 DIAGNOSIS — K5000 Crohn's disease of small intestine without complications: Secondary | ICD-10-CM

## 2011-11-04 MED ORDER — SODIUM CHLORIDE 0.9 % IV SOLN: 500.0000 mL | INTRAVENOUS | Status: DC

## 2011-11-04 NOTE — Progress Notes (Signed)
Patient did not experience any of the following events: a burn prior to discharge; a fall within the facility; wrong site/side/patient/procedure/implant event; or a hospital transfer or hospital admission upon discharge from the facility. (G8907) Patient did not have preoperative order for IV antibiotic SSI prophylaxis. (G8918)  

## 2011-11-04 NOTE — Patient Instructions (Signed)
YOU HAD AN ENDOSCOPIC PROCEDURE TODAY AT THE [facility]: Refer to the procedure report that was given to you for any specific questions about what was found during the examination.  If the procedure report does not answer your questions, please call your gastroenterologist to clarify.  If you requested that your care partner not be given the details of your procedure findings, then the procedure report has been included in a sealed envelope for you to review at your convenience later.  YOU SHOULD EXPECT: Some feelings of bloating in the abdomen. Passage of more gas than usual.  Walking can help get rid of the air that was put into your GI tract during the procedure and reduce the bloating. If you had a lower endoscopy (such as a colonoscopy or flexible sigmoidoscopy) you may notice spotting of blood in your stool or on the toilet paper. If you underwent a bowel prep for your procedure, then you may not have a normal bowel movement for a few days.  DIET: Your first meal following the procedure should be a light meal and then it is ok to progress to your normal diet.  A half-sandwich or bowl of soup is an example of a good first meal.  Heavy or fried foods are harder to digest and may make you feel nauseous or bloated.  Likewise meals heavy in dairy and vegetables can cause extra gas to form and this can also increase the bloating.  Drink plenty of fluids but you should avoid alcoholic beverages for 24 hours.  ACTIVITY: Your care partner should take you home directly after the procedure.  You should plan to take it easy, moving slowly for the rest of the day.  You can resume normal activity the day after the procedure however you should NOT DRIVE or use heavy machinery for 24 hours (because of the sedation medicines used during the test).    SYMPTOMS TO REPORT IMMEDIATELY: A gastroenterologist can be reached at any hour.  During normal business hours, 8:30 AM to 5:00 PM Monday through Friday,  call [phone].  After hours and on weekends, please call the GI answering service at [phone] who will take a message and have the physician on call contact you.   Following lower endoscopy (colonoscopy or flexible sigmoidoscopy):  Excessive amounts of blood in the stool  Significant tenderness or worsening of abdominal pains  Swelling of the abdomen that is new, acute  Fever of 100F or higher    FOLLOW UP: If any biopsies were taken you will be contacted by phone or by letter within the next 1-3 weeks.  Call your gastroenterologist if you have not heard about the biopsies in 3 weeks.  Our staff will call the home number listed on your records the next business day following your procedure to check on you and address any questions or concerns that you may have at that time regarding the information given to you following your procedure. This is a courtesy call and so if there is no answer at the home number and we have not heard from you through the emergency physician on call, we will assume that you have returned to your regular daily activities without incident.  SIGNATURES/CONFIDENTIALITY: You and/or your care partner have signed paperwork which will be entered into your electronic medical record.  These signatures attest to the fact that that the information above on your After Visit Summary has been reviewed and is understood.  Full responsibility of the confidentiality   of this discharge information lies with you and/or your care-partner.     

## 2011-11-04 NOTE — Progress Notes (Signed)
The pt tolerated the colonoscopy with dilatation very well. Maw

## 2011-11-04 NOTE — Op Note (Signed)
[facility] [address] [place], [place]  [id number]  COLONOSCOPY PROCEDURE REPORT  PATIENT:  Derek [name], Derek [name]  MR#:  [id number] BIRTHDATE:  Jul 27, 1941, 71 yrs. old  GENDER:  male ENDOSCOPIST:  [name]. [name], MD REF. BY:  Sharilyn Sites, M.D. PROCEDURE DATE:  11/04/2011 PROCEDURE:  Colonoscopy with biopsy, Colonoscopy with Balloon Dilation of Stricture ASA CLASS:  Class II INDICATIONS:  Crohn's disease, history of pre-cancerous (adenomatous) colon polyps Crohn's since 1978, s/p TI resection 1978, recurrent disease at the anastomosis 2006 and 2009 - stricture dilated in 2009, 2 episodes of " obstruction" last year MEDICATIONS:   MAC sedation, administered by CRNA, propofol (Diprivan) 250 mg  DESCRIPTION OF PROCEDURE:   After the risks and benefits and of the procedure were explained, informed consent was obtained. Digital rectal exam was performed and revealed no rectal masses. The LB 180AL [id number] endoscope was introduced through the anus and advanced to the ileum.  The quality of the prep was good, using MoviPrep.  The instrument was then slowly withdrawn as the colon was fully examined. <<PROCEDUREIMAGES>>  FINDINGS:  There were mucosal changes consistent with Crohn's disease. anastamosis several discrete ulcerations at the ileo colic anastomosis, shallow 4-5 mm ulcers With standard forceps, biopsy was obtained and sent to pathology (see image1, image2, and image5).  A stricture was found anastamosis open anastomosis, admitted 15 mm scope without difficulty Balloon dilatation was performed. 15,16,17,18 mm BS colonic ballon inflated in the anastomosis and held for 1 minute, small amount of bleeding from the anastomosis  Remainder of colon normal. normal T Ileum, no stricture or ulcers With standard forceps, biopsy was obtained and sent to pathology (see image4, image3, and image6).   Retroflexed views in the rectum revealed no abnormalities.    The scope was then  withdrawn from the patient and the procedure completed.  COMPLICATIONS:  None ENDOSCOPIC IMPRESSION: 1) Colitis - Crohn's in the anastamosis 2) Stricture in the anastamosis 3) Normal remainder anastomotic stricture much improved from the 2009 exam, dilated to 18 mm, sognificant decrease in the ulcerations - none found in the TI, several anastomotic ulcers present RECOMMENDATIONS: 1) Await biopsy results the obstructive episodes are not likely caused by the anastomotic stricture, they may be due to an adhesive disease elsewhere  REPEAT EXAM:  In 5 year(s) for.  ______________________________ [name]. [name], MD  CC:  n. eSIGNED:   [name]. Cashae Weich at 11/04/2011 08:49 AM  Deloris Ping, [phone]

## 2011-11-05 ENCOUNTER — Telehealth: Payer: Self-pay | Admitting: *Deleted

## 2011-11-05 NOTE — Telephone Encounter (Signed)
  Follow up Call-  Call back number 11/04/2011  Post procedure Call Back phone  # [phone]  Permission to leave phone message Yes     Patient questions:  Do you have a fever, pain , or abdominal swelling? no Pain Score  0 *  Have you tolerated food without any problems? yes  Have you been able to return to your normal activities? yes  Do you have any questions about your discharge instructions: Diet   no Medications  no Follow up visit  no  Do you have questions or concerns about your Care? no  Actions: * If pain score is 4 or above: No action needed, pain <4.

## 2011-11-09 ENCOUNTER — Encounter: Payer: Self-pay | Admitting: Internal Medicine

## 2011-12-02 DIAGNOSIS — L570 Actinic keratosis: Secondary | ICD-10-CM | POA: Diagnosis not present

## 2011-12-02 DIAGNOSIS — D235 Other benign neoplasm of skin of trunk: Secondary | ICD-10-CM | POA: Diagnosis not present

## 2011-12-11 DIAGNOSIS — M109 Gout, unspecified: Secondary | ICD-10-CM | POA: Diagnosis not present

## 2011-12-11 DIAGNOSIS — Z6826 Body mass index (BMI) 26.0-26.9, adult: Secondary | ICD-10-CM | POA: Diagnosis not present

## 2011-12-21 ENCOUNTER — Ambulatory Visit (INDEPENDENT_AMBULATORY_CARE_PROVIDER_SITE_OTHER): Payer: Medicare Other | Admitting: Cardiology

## 2011-12-21 ENCOUNTER — Encounter: Payer: Self-pay | Admitting: Cardiology

## 2011-12-21 VITALS — BP 97/64 | HR 67 | Ht 72.0 in | Wt 197.0 lb

## 2011-12-21 DIAGNOSIS — K5090 Crohn's disease, unspecified, without complications: Secondary | ICD-10-CM | POA: Diagnosis not present

## 2011-12-21 DIAGNOSIS — R079 Chest pain, unspecified: Secondary | ICD-10-CM

## 2011-12-21 DIAGNOSIS — I712 Thoracic aortic aneurysm, without rupture, unspecified: Secondary | ICD-10-CM | POA: Diagnosis not present

## 2011-12-21 DIAGNOSIS — N182 Chronic kidney disease, stage 2 (mild): Secondary | ICD-10-CM

## 2011-12-21 NOTE — Assessment & Plan Note (Addendum)
The patient reports no chest pain. He did have an abnormal Cardiolite study in 2011. However this did not appear to be high risk and therefore we will continue with medical therapy for now. If the patient needs surgery for his ascending aortic aneurysm further evaluation may be necessary. However we will hold off as the patient also has renal insufficiency. He does report some atypical chest pain but I really do not think an ischemia workup is necessary at this point in time. The pain is right-sided and is nonexertional. It mainly occurs when he is lying in bed

## 2011-12-21 NOTE — Patient Instructions (Signed)
   Please call the office in July when ready to do MRI and Echo If the results of your test are normal or stable, you will receive a letter.  If they are abnormal, the nurse will contact you by phone. Your physician wants you to follow up in: 6 months.  You will receive a reminder letter in the mail one-two months in advance.  If you don't receive a letter, please call our office to schedule the follow up appointment

## 2011-12-21 NOTE — Progress Notes (Signed)
Derek Real, MD, [facility] ABIM Board Certified in Adult Cardiovascular Medicine,Internal Medicine and Critical Care Medicine    CC:  Followup patient with ascending aortic aneurysm.  HPI:  Patient presents for routine followup of an ascending aortic aneurysm. A prior ultrasound showed that the aneurysm was 4.8 cm and by MRI this was only 4.1 cm and has remained stable since 2008. The patient reports atypical chest pain which is right-sided. His main occurs in bed when rolling over. He denies any exertional chest pain. He does have some occasional orthostatic symptoms but they only last seconds. His baseline blood pressures low relatively low. The patient is a significant Crohn's disease with ileocolic anastomosis and is followed by Dr. Maurene Capes. He has a prior history of pancreatitis from the Humira. He denies any orthopnea PND palpitations or syncope. Otherwise he has remained stable from a cardiac perspective. He did have a prior loss of Cardiolite study but we deferred further workup because he was stable in regards to his symptoms and also because of underlying renal insufficiency.   PMH: reviewed and listed in Problem List in Electronic Records (and see below) Past Medical History  Diagnosis Date  . Hiatal hernia   . GERD (gastroesophageal reflux disease)   . Gout   . History of pulmonary embolism 2006    from DVT lower extremity  . History of gastritis   . Internal hemorrhoids   . Post-traumatic stress syndrome   . History of hepatitis C   . B12 deficiency   . History of small bowel obstruction   . Pancytopenia     Hx of  . Crohn's disease   . RLS (restless legs syndrome)   . Hyperoxaluria     Intestinal  . Hypertension   . Nephrolithiasis   . CKD (chronic kidney disease) stage 3, GFR 30-59 ml/min 08/22/2011  . Pancreatitis 2010    elevated lipase and amylase, stranding in tail of pancreas, ? from Humira  . Rosacea conjunctivitis   . PTSD (post-traumatic stress disorder)     . Small bowel obstruction   . Anxiety   . Thrombocytopenia    Past Surgical History  Procedure Date  . Hernia repair   . Cholecystectomy   . Hemicolectomy     Right  . Bowel resection   . Knee arthroscopy     Left  . Ligament repair     Left knee  . Ankle surgery     Right  . Foot surgery     Right  . Terminal ileum resection     Allergies/SH/FHX : available in Electronic Records for review  Allergies  Allergen Reactions  . Allopurinol     "poisonous to me"  . Lorazepam     REACTION: hallucination   History   Social History  . Marital Status: Married    Spouse Name: N/A    Number of Children: 2  . Years of Education: N/A   Occupational History  . Retired    Social History Main Topics  . Smoking status: Former Research scientist (life sciences)  . Smokeless tobacco: Never Used  . Alcohol Use: No  . Drug Use: No  . Sexually Active: Not on file   Other Topics Concern  . Not on file   Social History Narrative   MarriedVeitnam VeteranDaily caffeineDoes not exercise regularly   Family History  Problem Relation Age of Onset  . Kidney disease Father   . Colon cancer Neg Hx   . Esophageal cancer Neg Hx   .  Stomach cancer Neg Hx   . Rectal cancer Neg Hx     Medications: Current Outpatient Prescriptions  Medication Sig Dispense Refill  . atenolol (TENORMIN) 25 MG tablet Take 25 mg by mouth daily.        . Calcium Carbonate (CALCIUM 500 PO) Take 2 tablets by mouth at bedtime.      . colchicine 0.6 MG tablet Take 0.6 mg by mouth daily.        . cyanocobalamin (,VITAMIN B-12,) 1000 MCG/ML injection Inject 1,000 mcg into the muscle every 14 (fourteen) days.      . febuxostat (ULORIC) 40 MG tablet Take 80 mg by mouth daily.        . fish oil-omega-3 fatty acids 1000 MG capsule Take 1 capsule by mouth 2 (two) times daily.        Marland Kitchen gabapentin (NEURONTIN) 100 MG capsule Take 100 mg by mouth 2 (two) times daily.        . Magnesium Oxide 420 MG TABS Take 1 tablet by mouth 2 (two) times  daily.       . Multiple Vitamin (MULTIVITAMIN) tablet Take 1 tablet by mouth daily.        Marland Kitchen omeprazole (PRILOSEC) 20 MG capsule Take 20 mg by mouth daily.        Marland Kitchen PARoxetine (PAXIL) 40 MG tablet Take 40 mg by mouth at bedtime.        . potassium citrate (UROCIT-K) 10 MEQ (1080 MG) SR tablet Take 10 mEq by mouth 2 (two) times daily.      Marland Kitchen terazosin (HYTRIN) 2 MG capsule Take 2 mg by mouth at bedtime.       . traZODone (DESYREL) 150 MG tablet Take 150 mg by mouth at bedtime.          ROS: No nausea or vomiting. No fever or chills.No melena or hematochezia.No bleeding.No claudication  Physical Exam: BP 97/64  Pulse 67  Ht 6' (1.829 m)  Wt 197 lb (89.359 kg)  BMI 26.72 kg/m2 General: well-nourished white male in no distress. Neck: normal carotid upstroke no carotid bruits. No thyromegaly. Nonnodular thyroid. JVP 6 cm Lungs: clear breath sounds bilaterally with no wheezing. Cardiac: regular rate and rhythm with normal S1-S2 no murmur rubs or gallops. I could not hear a murmur of aortic insufficiency. Vascular: no edema. Normal distal pulses Skin: warm and dry Physcologic: normal affect  12lead ECG: Limited bedside ECHO:N/A No images are attached to the encounter.   Following studies were reviewed:        Assessment and Plan  CHEST PAIN UNSPECIFIED The patient reports no chest pain. He did have an abnormal Cardiolite study in 2011. However this did not appear to be high risk and therefore we will continue with medical therapy for now. If the patient needs surgery for his ascending aortic aneurysm further evaluation may be necessary. However we will hold off as the patient also has renal insufficiency. He does report some atypical chest pain but I really do not think an ischemia workup is necessary at this point in time. The pain is right-sided and is nonexertional. It mainly occurs when he is lying in bed  Ascending aortic aneurysm The patient reports no chest pain or patent  radiating to the back. We will plan on a followup MRI with black blood imaging and avoid contrast to 2 weeks renal insufficiency. This time we will obtain an MRI both of the chest and the abdomen to make sure that the  patient has no other focal aneurysmal. disease. Because of the patient's history of dilatation of the sinus of Valsalva we will also order an echocardiogram for followup.  CROHN'S DISEASE Followed by Dr. Maurene Capes.  RENAL DISEASE, CHRONIC, MILD Creatinine 1.8 followed by Dr. Jimmy Footman.    Patient Active Problem List  Diagnoses  . B12 DEFICIENCY  . Gout, unspecified  . Pancytopenia  . POST TRAUMATIC STRESS SYNDROME  . CORONARY ATHEROSCLEROSIS NATIVE CORONARY ARTERY  . Ascending aortic aneurysm  . INTERNAL HEMORRHOIDS  . GERD  . HIATAL HERNIA  . CROHN'S DISEASE  . SMALL BOWEL OBSTRUCTION  . PANCREATITIS, ACUTE  . RENAL DISEASE, CHRONIC, MILD  . CHEST PAIN UNSPECIFIED  . HEPATITIS C, HX OF  . PULMONARY EMBOLISM, HX OF  . GASTRITIS, HX OF  . Anxiety  . CKD (chronic kidney disease) stage 3, GFR 30-59 ml/min  . Thrombocytopenia  . ARF (acute renal failure)

## 2011-12-21 NOTE — Assessment & Plan Note (Signed)
Creatinine 1.8 followed by Dr. Jimmy Footman.

## 2011-12-21 NOTE — Assessment & Plan Note (Signed)
Followed by Dr. Maurene Capes.

## 2011-12-21 NOTE — Assessment & Plan Note (Addendum)
The patient reports no chest pain or patent radiating to the back. We will plan on a followup MRI with black blood imaging and avoid contrast to 2 weeks renal insufficiency. This time we will obtain an MRI both of the chest and the abdomen to make sure that the patient has no other focal aneurysmal disease.

## 2012-02-09 ENCOUNTER — Telehealth: Payer: Self-pay | Admitting: Cardiology

## 2012-02-09 DIAGNOSIS — I712 Thoracic aortic aneurysm, without rupture: Secondary | ICD-10-CM

## 2012-02-09 NOTE — Telephone Encounter (Signed)
Please call the office in July when ready to do MRI and Echo Derek [name] is ready to set up his test. Do not set up on Tuesdays and Thursdays. Call [phone]

## 2012-02-09 NOTE — Telephone Encounter (Signed)
Orders are in

## 2012-02-11 ENCOUNTER — Other Ambulatory Visit: Payer: Self-pay | Admitting: Cardiology

## 2012-02-11 DIAGNOSIS — I712 Thoracic aortic aneurysm, without rupture: Secondary | ICD-10-CM

## 2012-02-11 NOTE — Telephone Encounter (Signed)
Called Mr. Rao and left message for him to return my call. Will schedule test.

## 2012-02-15 ENCOUNTER — Telehealth: Payer: Self-pay

## 2012-02-15 NOTE — Telephone Encounter (Signed)
MRI Chest and Abdomen scheduled for 02-19-2012 @ [facility] Checking percert

## 2012-02-15 NOTE — Telephone Encounter (Signed)
[employer] no precert required per [employer] website [employer] - no precert required

## 2012-02-19 DIAGNOSIS — I712 Thoracic aortic aneurysm, without rupture, unspecified: Secondary | ICD-10-CM | POA: Diagnosis not present

## 2012-02-19 DIAGNOSIS — I77810 Thoracic aortic ectasia: Secondary | ICD-10-CM | POA: Diagnosis not present

## 2012-02-23 ENCOUNTER — Telehealth: Payer: Self-pay | Admitting: *Deleted

## 2012-02-23 NOTE — Telephone Encounter (Signed)
Notes Recorded by Laurine Blazer, LPN on 5/73/2256 at 72:09 AM Wife notified of below.

## 2012-02-23 NOTE — Telephone Encounter (Signed)
Message copied by Laurine Blazer on Tue Feb 23, 2012 10:32 AM ------      Message from: Aurora Mask C      Created: Mon Feb 22, 2012  4:51 PM       Stable ascending aortic aneurysm, with 4.4 cm at greatest diameter.

## 2012-03-09 ENCOUNTER — Other Ambulatory Visit: Payer: Medicare Other

## 2012-03-09 DIAGNOSIS — N401 Enlarged prostate with lower urinary tract symptoms: Secondary | ICD-10-CM | POA: Diagnosis not present

## 2012-03-09 DIAGNOSIS — N200 Calculus of kidney: Secondary | ICD-10-CM | POA: Diagnosis not present

## 2012-03-11 ENCOUNTER — Telehealth: Payer: Self-pay | Admitting: *Deleted

## 2012-03-11 NOTE — Telephone Encounter (Signed)
Patient had questions about seeing GD, states he had discussed at last OV about sending him to [facility] for some type of surgery for aneurysm.  Informed patient that most recent MRI did show stable ascending aortic aneurysm.  Also, informed patient that GD did not mention anything for [facility] in his dictation.  He was given 6 month follow up from 12/21/2011 visit.

## 2012-03-23 ENCOUNTER — Other Ambulatory Visit (INDEPENDENT_AMBULATORY_CARE_PROVIDER_SITE_OTHER): Payer: Medicare Other

## 2012-03-23 ENCOUNTER — Other Ambulatory Visit: Payer: Self-pay

## 2012-03-23 DIAGNOSIS — I2510 Atherosclerotic heart disease of native coronary artery without angina pectoris: Secondary | ICD-10-CM

## 2012-03-23 DIAGNOSIS — I712 Thoracic aortic aneurysm, without rupture: Secondary | ICD-10-CM

## 2012-03-23 DIAGNOSIS — I359 Nonrheumatic aortic valve disorder, unspecified: Secondary | ICD-10-CM | POA: Diagnosis not present

## 2012-03-23 NOTE — Telephone Encounter (Signed)
Aortic root 48 mm- slightly larger then MRI, but latter is more accurate. I do think its reasonable to refer patient to [facility] for future F/U - no indication for surgery currently however.

## 2012-03-28 ENCOUNTER — Telehealth: Payer: Self-pay | Admitting: *Deleted

## 2012-03-28 NOTE — Telephone Encounter (Signed)
Notes Recorded by Laurine Blazer, LPN on 6/43/8377 at 93:96 AM Wife [name]) notified of below.

## 2012-03-28 NOTE — Telephone Encounter (Signed)
Wife ([name]) notified of below.  Will wait to discuss further at follow up in November.

## 2012-03-28 NOTE — Telephone Encounter (Signed)
Message copied by Laurine Blazer on Mon Mar 28, 2012 11:31 AM ------      Message from: Derek [name]      Created: Sat Mar 26, 2012 11:55 AM       Aneurysm stable f/u 6 months

## 2012-03-30 DIAGNOSIS — K5090 Crohn's disease, unspecified, without complications: Secondary | ICD-10-CM | POA: Diagnosis not present

## 2012-03-30 DIAGNOSIS — N008 Acute nephritic syndrome with other morphologic changes: Secondary | ICD-10-CM | POA: Diagnosis not present

## 2012-03-30 DIAGNOSIS — N183 Chronic kidney disease, stage 3 unspecified: Secondary | ICD-10-CM | POA: Diagnosis not present

## 2012-05-04 DIAGNOSIS — M109 Gout, unspecified: Secondary | ICD-10-CM | POA: Diagnosis not present

## 2012-05-04 DIAGNOSIS — Z7182 Exercise counseling: Secondary | ICD-10-CM | POA: Diagnosis not present

## 2012-05-04 DIAGNOSIS — N189 Chronic kidney disease, unspecified: Secondary | ICD-10-CM | POA: Diagnosis not present

## 2012-05-04 DIAGNOSIS — Z79899 Other long term (current) drug therapy: Secondary | ICD-10-CM | POA: Diagnosis not present

## 2012-05-04 DIAGNOSIS — Z125 Encounter for screening for malignant neoplasm of prostate: Secondary | ICD-10-CM | POA: Diagnosis not present

## 2012-05-04 DIAGNOSIS — Z713 Dietary counseling and surveillance: Secondary | ICD-10-CM | POA: Diagnosis not present

## 2012-05-09 ENCOUNTER — Ambulatory Visit (INDEPENDENT_AMBULATORY_CARE_PROVIDER_SITE_OTHER): Payer: Medicare Other | Admitting: Cardiology

## 2012-05-09 ENCOUNTER — Encounter: Payer: Self-pay | Admitting: Cardiology

## 2012-05-09 VITALS — BP 115/71 | HR 52 | Ht 72.0 in | Wt 209.8 lb

## 2012-05-09 DIAGNOSIS — I712 Thoracic aortic aneurysm, without rupture, unspecified: Secondary | ICD-10-CM | POA: Diagnosis not present

## 2012-05-09 DIAGNOSIS — D696 Thrombocytopenia, unspecified: Secondary | ICD-10-CM | POA: Diagnosis not present

## 2012-05-09 NOTE — Patient Instructions (Addendum)
Your physician recommends that you have  lab work today--BMET/CBC  Schedule an appointment for an MRA of your chest in February 2014.  Your physician recommends that you schedule a follow-up appointment with Dr Aundra Dubin in February 2014 a few days after you have the MRA of your chest.

## 2012-05-10 LAB — CBC WITH DIFFERENTIAL/PLATELET
Basophils Absolute: 0 10*3/uL (ref 0.0–0.1)
Eosinophils Absolute: 0.2 10*3/uL (ref 0.0–0.7)
MCHC: 32.9 g/dL (ref 30.0–36.0)
MCV: 89.3 fl (ref 78.0–100.0)
Monocytes Absolute: 0.3 10*3/uL (ref 0.1–1.0)
Neutrophils Relative %: 78.6 % — ABNORMAL HIGH (ref 43.0–77.0)
Platelets: 115 10*3/uL — ABNORMAL LOW (ref 150.0–400.0)
RDW: 14.4 % (ref 11.5–14.6)

## 2012-05-10 LAB — BASIC METABOLIC PANEL
BUN: 20 mg/dL (ref 6–23)
CO2: 26 mEq/L (ref 19–32)
Calcium: 8.5 mg/dL (ref 8.4–10.5)
Chloride: 108 mEq/L (ref 96–112)
Creatinine, Ser: 2 mg/dL — ABNORMAL HIGH (ref 0.4–1.5)

## 2012-05-10 NOTE — Progress Notes (Signed)
Patient ID: Derek [name], male   DOB: 07/17/1941, 71 y.o.   MRN: [id number] PCP: Dr. Hilma Favors  71 yo with history of ascending aortic and aortic root aneurysm as well as Crohns disease presents for cardiology followup.  I am seeing him for the first time today.  Last study was an echo in 8/13, showing a 4.8 cm aortic root.  He also had an MRA in 7/13.  He is on a beta blocker but no ARB due to CKD.  He has been doing well in general.  He has rare non-exertional mild chest tightness about once a month. This is a chronic pattern and has not changed.  BP is well-controlled on just atenolol.  No exertional dyspnea.  He is retired but remains active, likes to make furniture.  He bruises easily with chronic thrombocytopenia and is not on aspirin.   ECG: NSR, borderline LVH  Labs (1/13): K 3.3, creatinine 1.52, plts 96,000 Labs (3/13): K 4.9, creatinine 1.99  PMH: 1. Gout 2. Depression 3. BPH 4. Ascending aortic aneurysm: MRA chest (7/13): 4.4 cm ascending aorta at sinotubular junction.  Echo (8/13) with EF 55-60%, mild AI, aortic root 4.8 cm.  5. Crohns Disease: H/o SBOs.  Patient has had resection of the terminal ileum.  H/o active Crohns at the ileo-colic anastomosis.  6. H/o PE/DVT in 2006.  7. GERD/hiatal hernia 8. PTSD 9. HCV antibody positive but viral RNA negative by PCR 10. B12 deficiency 11. Restless leg syndrome.  12. HTN 13. Nephrolithiasis. 14. CKD: followed by Dr. Jimmy Footman.  15. H/o pancreatitis from Humira 16. Chronic thrombocytopenia 17. H/o CCY 18. Per report, abnormal cardiolite in 2011.  No cath because not particularly symptomatic and has CKD.   SH: Married with 2 children, lives in [place], retired Chief Financial Officer, prior smoker.   FH: Mother died from ruptured AAA, sister with SAH, father with "hole in his heart."   ROS: All systems reviewed and negative except as per HPI.   Current Outpatient Prescriptions  Medication Sig Dispense Refill  .  atenolol (TENORMIN) 25 MG tablet Take 25 mg by mouth daily.        . Calcium Carbonate (CALCIUM 500 PO) Take 2 tablets by mouth at bedtime.      . colchicine 0.6 MG tablet Take 0.6 mg by mouth daily.        . cyanocobalamin (,VITAMIN B-12,) 1000 MCG/ML injection Inject 1,000 mcg into the muscle every 14 (fourteen) days.      . febuxostat (ULORIC) 40 MG tablet Take 80 mg by mouth daily.        . fish oil-omega-3 fatty acids 1000 MG capsule Take 1 capsule by mouth 2 (two) times daily.        Marland Kitchen gabapentin (NEURONTIN) 100 MG capsule Take 100 mg by mouth 3 (three) times daily.       . Magnesium Oxide 420 MG TABS Take 1 tablet by mouth 2 (two) times daily.       . Multiple Vitamin (MULTIVITAMIN) tablet Take 1 tablet by mouth daily.        Marland Kitchen omeprazole (PRILOSEC) 20 MG capsule Take 20 mg by mouth daily.        Marland Kitchen PARoxetine (PAXIL) 40 MG tablet Take 40 mg by mouth at bedtime.        . potassium citrate (UROCIT-K) 10 MEQ (1080 MG) SR tablet Take 10 mEq by mouth 2 (two) times daily.      Marland Kitchen terazosin (HYTRIN) 2 MG  capsule Take 2 mg by mouth at bedtime.       . traZODone (DESYREL) 150 MG tablet Take 150 mg by mouth at bedtime.          BP 115/71  Pulse 52  Ht 6' (1.829 m)  Wt 209 lb 12.8 oz (95.165 kg)  BMI 28.45 kg/m2 General: NAD Neck: No JVD, no thyromegaly or thyroid nodule.  Lungs: Clear to auscultation bilaterally with normal respiratory effort. CV: Nondisplaced PMI.  Heart regular S1/S2, no S3/S4, no murmur.  No peripheral edema.  No carotid bruit.  Normal pedal pulses.  Abdomen: Soft, nontender, no hepatosplenomegaly, no distention.  Neurologic: Alert and oriented x 3.  Psych: Normal affect. Extremities: No clubbing or cyanosis.   Assessment/Plan: 1. Ascending aortic aneurysm: 4.8 cm root on 8/13 echo.  Needs repeat study in 6 months: would get MRA chest in 2/13.  I will need to go back and re-assess the echo for bicuspid aortic valve given the aneurysm but my impression from the record is  that it is trileaflet.  Continue atenolol.  Not on ARB because of CKD.  2. CKD: Will get BMET today.  Will need to be careful with MRA contrast in the future.  3. History of thrombocytopenia: Not on ASA.  Will get CBC.  4. I will get a copy of his lipids from PCP.   [name] 05/10/2012 8:44 AM

## 2012-05-11 ENCOUNTER — Encounter: Payer: Medicare Other | Admitting: Cardiology

## 2012-05-17 DIAGNOSIS — M545 Low back pain, unspecified: Secondary | ICD-10-CM | POA: Diagnosis not present

## 2012-05-17 DIAGNOSIS — S335XXA Sprain of ligaments of lumbar spine, initial encounter: Secondary | ICD-10-CM | POA: Diagnosis not present

## 2012-05-17 DIAGNOSIS — Z6828 Body mass index (BMI) 28.0-28.9, adult: Secondary | ICD-10-CM | POA: Diagnosis not present

## 2012-05-23 DIAGNOSIS — D235 Other benign neoplasm of skin of trunk: Secondary | ICD-10-CM | POA: Diagnosis not present

## 2012-05-23 DIAGNOSIS — D485 Neoplasm of uncertain behavior of skin: Secondary | ICD-10-CM | POA: Diagnosis not present

## 2012-05-23 DIAGNOSIS — B079 Viral wart, unspecified: Secondary | ICD-10-CM | POA: Diagnosis not present

## 2012-07-07 DIAGNOSIS — J069 Acute upper respiratory infection, unspecified: Secondary | ICD-10-CM | POA: Diagnosis not present

## 2012-07-21 DIAGNOSIS — L719 Rosacea, unspecified: Secondary | ICD-10-CM | POA: Diagnosis not present

## 2012-07-21 DIAGNOSIS — L708 Other acne: Secondary | ICD-10-CM | POA: Diagnosis not present

## 2012-08-22 ENCOUNTER — Other Ambulatory Visit (INDEPENDENT_AMBULATORY_CARE_PROVIDER_SITE_OTHER): Payer: Medicare Other

## 2012-08-22 DIAGNOSIS — I712 Thoracic aortic aneurysm, without rupture, unspecified: Secondary | ICD-10-CM | POA: Diagnosis not present

## 2012-08-22 LAB — BASIC METABOLIC PANEL
BUN: 23 mg/dL (ref 6–23)
GFR: 40.91 mL/min — ABNORMAL LOW (ref >60.00)
Potassium: 4 mEq/L (ref 3.5–5.1)
Sodium: 138 mEq/L (ref 135–145)

## 2012-09-05 ENCOUNTER — Ambulatory Visit (HOSPITAL_COMMUNITY)
Admission: RE | Admit: 2012-09-05 | Discharge: 2012-09-05 | Disposition: A | Discharge status: Home / Self Care | Payer: Medicare Other | Source: Ambulatory Visit | Attending: Cardiology | Admitting: Cardiology

## 2012-09-05 DIAGNOSIS — N401 Enlarged prostate with lower urinary tract symptoms: Secondary | ICD-10-CM | POA: Diagnosis not present

## 2012-09-05 DIAGNOSIS — I359 Nonrheumatic aortic valve disorder, unspecified: Secondary | ICD-10-CM | POA: Insufficient documentation

## 2012-09-05 DIAGNOSIS — I712 Thoracic aortic aneurysm, without rupture, unspecified: Secondary | ICD-10-CM | POA: Insufficient documentation

## 2012-09-05 DIAGNOSIS — N183 Chronic kidney disease, stage 3 unspecified: Secondary | ICD-10-CM | POA: Diagnosis not present

## 2012-09-05 DIAGNOSIS — N200 Calculus of kidney: Secondary | ICD-10-CM | POA: Diagnosis not present

## 2012-09-05 MED ORDER — GADOBENATE DIMEGLUMINE 529 MG/ML IV SOLN
10.0000 mL | Freq: Once | INTRAVENOUS | Status: AC
  Administered 2012-09-05: 10 mL via INTRAVENOUS

## 2012-09-07 ENCOUNTER — Telehealth: Payer: Self-pay | Admitting: *Deleted

## 2012-09-07 NOTE — Telephone Encounter (Signed)
Notes Recorded by Laurine Blazer, LPN on 0/01/8165 at 1:96 AM Patient notified. Already has follow up scheduled for 2/28 with Dr. Aundra Dubin in our [facility] office.

## 2012-09-07 NOTE — Telephone Encounter (Signed)
Message copied by Laurine Blazer on Wed Sep 07, 2012  9:23 AM ------      Message from: Larey Dresser      Created: Tue Sep 06, 2012 12:17 PM       Stable 4.7 cm aortic root aneurysm.  Repeat MRA chest in 1 year.

## 2012-09-12 ENCOUNTER — Ambulatory Visit: Payer: Medicare Other | Admitting: Cardiology

## 2012-09-17 ENCOUNTER — Other Ambulatory Visit: Payer: Self-pay

## 2012-09-19 DIAGNOSIS — N2581 Secondary hyperparathyroidism of renal origin: Secondary | ICD-10-CM | POA: Diagnosis not present

## 2012-09-19 DIAGNOSIS — N200 Calculus of kidney: Secondary | ICD-10-CM | POA: Diagnosis not present

## 2012-09-19 DIAGNOSIS — E213 Hyperparathyroidism, unspecified: Secondary | ICD-10-CM | POA: Diagnosis not present

## 2012-09-19 DIAGNOSIS — N183 Chronic kidney disease, stage 3 unspecified: Secondary | ICD-10-CM | POA: Diagnosis not present

## 2012-09-19 DIAGNOSIS — M109 Gout, unspecified: Secondary | ICD-10-CM | POA: Diagnosis not present

## 2012-09-30 ENCOUNTER — Encounter: Payer: Self-pay | Admitting: Cardiology

## 2012-09-30 ENCOUNTER — Ambulatory Visit (INDEPENDENT_AMBULATORY_CARE_PROVIDER_SITE_OTHER): Payer: Medicare Other | Admitting: Cardiology

## 2012-09-30 VITALS — BP 110/76 | HR 70 | Ht 72.0 in | Wt 203.0 lb

## 2012-09-30 DIAGNOSIS — N183 Chronic kidney disease, stage 3 unspecified: Secondary | ICD-10-CM

## 2012-09-30 DIAGNOSIS — I712 Thoracic aortic aneurysm, without rupture, unspecified: Secondary | ICD-10-CM

## 2012-09-30 DIAGNOSIS — I351 Nonrheumatic aortic (valve) insufficiency: Secondary | ICD-10-CM

## 2012-09-30 DIAGNOSIS — D696 Thrombocytopenia, unspecified: Secondary | ICD-10-CM

## 2012-09-30 DIAGNOSIS — I359 Nonrheumatic aortic valve disorder, unspecified: Secondary | ICD-10-CM | POA: Diagnosis not present

## 2012-09-30 DIAGNOSIS — I7121 Aneurysm of the ascending aorta, without rupture: Secondary | ICD-10-CM

## 2012-09-30 LAB — LIPID PANEL
Cholesterol: 150 mg/dL (ref 0–200)
HDL: 45.2 mg/dL (ref >39.00)
Triglycerides: 329 mg/dL — ABNORMAL HIGH (ref 0.0–149.0)
VLDL: 65.8 mg/dL — ABNORMAL HIGH (ref 0.0–40.0)

## 2012-09-30 NOTE — Patient Instructions (Addendum)
Take aspirin 85m every other day. Take this with food.  Your physician recommends that you have a FASTING lipid profile today-  Your physician has requested that you have an echocardiogram. Echocardiography is a painless test that uses sound waves to create images of your heart. It provides your doctor with information about the size and shape of your heart and how well your heart's chambers and valves are working. This procedure takes approximately one hour. There are no restrictions for this procedure. February 2015.  Schedule an appointment for an MRA of your chest in February 2015.  Your physician wants you to follow-up in: 1 year with Dr MAundra Dubinafter you have echocardiogram and MRA of your chest. You will receive a reminder letter in the mail two months in advance. If you don't receive a letter, please call our office to schedule the follow-up appointment.

## 2012-09-30 NOTE — Progress Notes (Signed)
Patient ID: Derek [name], male   DOB: [birth year], 72 y.o.   MRN: [id number] PCP: Dr. Hilma Favors  72 yo with history of ascending aortic and aortic root aneurysm as well as Crohns disease presents for cardiology followup. MRA chest in 2/14 showed 4.7 cm aortic root at the Sinuses of Valsalva.  He is on a beta blocker but no ARB due to CKD.  He has been doing well in general.  He has rare non-exertional mild chest tightness about once a month. This is a chronic pattern and has not changed.  BP is well-controlled on just atenolol.  No exertional dyspnea.  He is retired but remains active, likes to make furniture.  He bruises easily with chronic thrombocytopenia and is not on aspirin.   Labs (1/13): K 3.3, creatinine 1.52, plts 96,000 Labs (3/13): K 4.9, creatinine 1.99 Labs (10/13): plts 115 Labs (1/14): K 4, creatinine 1.8  PMH: 1. Gout 2. Depression 3. BPH 4. Ascending aortic aneurysm: MRA chest (7/13): 4.4 cm ascending aorta at sinotubular junction.  Echo (8/13) with EF 55-60%, mild AI, aortic root 4.8 cm.  MRA chest (2/14) with 4.7 cm aortic root at the Sinuses of Valsalva, 4.0 cm ascending aorta.  Aortic valve is trileaflet.  5. Crohns Disease: H/o SBOs.  Patient has had resection of the terminal ileum.  H/o active Crohns at the ileo-colic anastomosis.  6. H/o PE/DVT in 2006.  7. GERD/hiatal hernia 8. PTSD 9. HCV antibody positive but viral RNA negative by PCR 10. B12 deficiency 11. Restless leg syndrome.  12. HTN 13. Nephrolithiasis. 14. CKD: followed by Dr. Jimmy Footman.  15. H/o pancreatitis from Humira 16. Chronic thrombocytopenia 17. H/o CCY 18. Per report, abnormal cardiolite in 2011.  No cath because not particularly symptomatic and has CKD.  19. Abdominal CT in 5/12 showed no AAA.   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   FH: Mother died from ruptured AAA, sister with SAH, father with "hole in his heart."   ROS:  All systems reviewed and negative except as per HPI.   Current Outpatient Prescriptions  Medication Sig Dispense Refill  . atenolol (TENORMIN) 25 MG tablet Take 25 mg by mouth daily.        . Calcium Carbonate (CALCIUM 500 PO) Take 2 tablets by mouth at bedtime.      . colchicine 0.6 MG tablet Take 0.6 mg by mouth daily.        . cyanocobalamin (,VITAMIN B-12,) 1000 MCG/ML injection Inject 1,000 mcg into the muscle every 14 (fourteen) days.      . febuxostat (ULORIC) 40 MG tablet Take 80 mg by mouth daily.        . fish oil-omega-3 fatty acids 1000 MG capsule Take 1 capsule by mouth 2 (two) times daily.        Marland Kitchen gabapentin (NEURONTIN) 100 MG capsule Take 100 mg by mouth 3 (three) times daily.       . Magnesium Oxide 420 MG TABS Take 1 tablet by mouth 2 (two) times daily.       . Multiple Vitamin (MULTIVITAMIN) tablet Take 1 tablet by mouth daily.        Marland Kitchen omeprazole (PRILOSEC) 20 MG capsule Take 20 mg by mouth daily.        Marland Kitchen PARoxetine (PAXIL) 40 MG tablet Take 40 mg by mouth at bedtime.        . potassium citrate (UROCIT-K) 10 MEQ (1080 MG) SR tablet  Take 10 mEq by mouth 2 (two) times daily.      Marland Kitchen terazosin (HYTRIN) 2 MG capsule Take 2 mg by mouth at bedtime.       . traZODone (DESYREL) 150 MG tablet Take 150 mg by mouth at bedtime.        Marland Kitchen aspirin EC 81 MG tablet 1 every other day       No current facility-administered medications for this visit.    BP 110/76  Pulse 70  Ht 6' (1.829 m)  Wt 203 lb (92.08 kg)  BMI 27.53 kg/m2  SpO2 97% General: NAD Neck: No JVD, no thyromegaly or thyroid nodule.  Lungs: Clear to auscultation bilaterally with normal respiratory effort. CV: Nondisplaced PMI.  Heart regular S1/S2, no S3/S4, no murmur.  No peripheral edema.  No carotid bruit.  Normal pedal pulses.  Abdomen: Soft, nontender, no hepatosplenomegaly, no distention.  Neurologic: Alert and oriented x 3.  Psych: Normal affect. Extremities: No clubbing or cyanosis.   Assessment/Plan: 1.  Aortic root aneurysm: 4.7 cm aortic root at Sinuses of Valsalva, stable. Continue atenolol.  Not on ARB because of CKD.  The aortic valve is trileaflet with mild AI.  Will repeat echo to reassess aortic regurgitation and MRA of chest for aortic root/ascending aorta dimensions in 2/15.  2. CKD: Stable CKD on last BMET.  3. History of thrombocytopenia: Not on ASA.  Last CBC showed mild thrombocytopenia.  He will try to take ASA 81 mg every other day.  4. Check lipids today.   [name] 09/30/2012

## 2012-10-04 ENCOUNTER — Other Ambulatory Visit: Payer: Self-pay | Admitting: *Deleted

## 2012-10-10 DIAGNOSIS — N183 Chronic kidney disease, stage 3 unspecified: Secondary | ICD-10-CM | POA: Diagnosis not present

## 2012-10-17 DIAGNOSIS — D649 Anemia, unspecified: Secondary | ICD-10-CM | POA: Diagnosis not present

## 2012-10-17 DIAGNOSIS — N183 Chronic kidney disease, stage 3 unspecified: Secondary | ICD-10-CM | POA: Diagnosis not present

## 2012-10-25 DIAGNOSIS — J019 Acute sinusitis, unspecified: Secondary | ICD-10-CM | POA: Diagnosis not present

## 2012-10-25 DIAGNOSIS — Z6828 Body mass index (BMI) 28.0-28.9, adult: Secondary | ICD-10-CM | POA: Diagnosis not present

## 2012-10-25 DIAGNOSIS — D6949 Other primary thrombocytopenia: Secondary | ICD-10-CM | POA: Diagnosis not present

## 2012-11-04 DIAGNOSIS — M25519 Pain in unspecified shoulder: Secondary | ICD-10-CM | POA: Diagnosis not present

## 2012-11-15 DIAGNOSIS — J019 Acute sinusitis, unspecified: Secondary | ICD-10-CM | POA: Diagnosis not present

## 2012-11-15 DIAGNOSIS — M109 Gout, unspecified: Secondary | ICD-10-CM | POA: Diagnosis not present

## 2012-11-15 DIAGNOSIS — J209 Acute bronchitis, unspecified: Secondary | ICD-10-CM | POA: Diagnosis not present

## 2012-11-15 DIAGNOSIS — Z6828 Body mass index (BMI) 28.0-28.9, adult: Secondary | ICD-10-CM | POA: Diagnosis not present

## 2012-11-22 ENCOUNTER — Other Ambulatory Visit: Payer: Self-pay | Admitting: Oncology

## 2012-11-22 DIAGNOSIS — D696 Thrombocytopenia, unspecified: Secondary | ICD-10-CM

## 2012-11-22 DIAGNOSIS — E538 Deficiency of other specified B group vitamins: Secondary | ICD-10-CM

## 2012-11-23 ENCOUNTER — Telehealth: Payer: Self-pay | Admitting: Oncology

## 2012-11-23 ENCOUNTER — Ambulatory Visit (HOSPITAL_BASED_OUTPATIENT_CLINIC_OR_DEPARTMENT_OTHER): Payer: Medicare Other | Admitting: Oncology

## 2012-11-23 ENCOUNTER — Encounter: Payer: Self-pay | Admitting: Oncology

## 2012-11-23 ENCOUNTER — Other Ambulatory Visit (HOSPITAL_BASED_OUTPATIENT_CLINIC_OR_DEPARTMENT_OTHER): Payer: Medicare Other | Admitting: Lab

## 2012-11-23 ENCOUNTER — Ambulatory Visit: Payer: Medicare Other

## 2012-11-23 VITALS — BP 106/71 | HR 51 | Temp 97.3°F | Resp 18 | Ht 71.0 in | Wt 208.3 lb

## 2012-11-23 DIAGNOSIS — E538 Deficiency of other specified B group vitamins: Secondary | ICD-10-CM

## 2012-11-23 DIAGNOSIS — D696 Thrombocytopenia, unspecified: Secondary | ICD-10-CM | POA: Diagnosis not present

## 2012-11-23 LAB — COMPREHENSIVE METABOLIC PANEL (CC13)
ALT: 43 U/L (ref 0–55)
Albumin: 3.4 g/dL — ABNORMAL LOW (ref 3.5–5.0)
CO2: 26 mEq/L (ref 22–29)
Calcium: 8.8 mg/dL (ref 8.4–10.4)
Chloride: 103 mEq/L (ref 98–107)
Creatinine: 1.9 mg/dL — ABNORMAL HIGH (ref 0.7–1.3)
Potassium: 4.5 mEq/L (ref 3.5–5.1)
Total Protein: 6.7 g/dL (ref 6.4–8.3)

## 2012-11-23 LAB — CBC WITH DIFFERENTIAL/PLATELET
Basophils Absolute: 0 10*3/uL (ref 0.0–0.1)
EOS%: 3.7 % (ref 0.0–7.0)
HGB: 14.1 g/dL (ref 13.0–17.1)
MCH: 29.3 pg (ref 27.2–33.4)
MCV: 86.7 fL (ref 79.3–98.0)
MONO%: 6.6 % (ref 0.0–14.0)
RBC: 4.82 10*6/uL (ref 4.20–5.82)
RDW: 13.9 % (ref 11.0–14.6)

## 2012-11-23 LAB — MORPHOLOGY

## 2012-11-23 LAB — LACTATE DEHYDROGENASE (CC13): LDH: 153 U/L (ref 125–245)

## 2012-11-23 LAB — CHCC SMEAR

## 2012-11-23 NOTE — Progress Notes (Signed)
Checked in patient. No financial issues.

## 2012-11-23 NOTE — Progress Notes (Signed)
This office note has been dictated.  #[id number]

## 2012-11-23 NOTE — Telephone Encounter (Signed)
gv and printed pt appt sched and avs for OCT

## 2012-11-24 NOTE — Progress Notes (Signed)
CC:   Derek [name]. Derek Perches, MD Derek [name], M.D. Derek [name], M.D. Derek Champagne, MD  PROBLEM LIST: 1. Thrombocytopenia dating back to 2006 with some variability,     suggesting immune dysregulation. 2. Crohn's disease diagnosed in the late 1970s. 3. Status post resections of the small and large intestine in 1978,     two separate surgeries. 4. History of multiple small-bowel obstructions, felt to be due to     adhesions.  Treated nonsurgically. 5. History of vitamin B12 deficiency secondary to resection of the     ileum.  The patient receives parental vitamin B12 injections from     his wife twice weekly. 6. History of pancytopenia due to interaction between 6-mercaptopurine     and allopurinol for which the patient was hospitalized in 2006. 7. Chronic renal disease, stage III, with estimated GFR between 35 and     45. 8. History of gastroesophageal reflux disease and hiatal hernia. 9. History of tophaceous gout. 10. History of uric acid kidney stones treated with lithotripsy. 11. History of bilateral pulmonary emboli and left lower extremity deep     venous thrombosis in 2006 while patient was hospitalized. 12. History of hepatitis C dating back to 62. 13. History of posttraumatic stress disorder. 14. Ascending abdominal aneurysm. 15. Rosacea. 16. Benign prostatic hypertrophy.   MEDICATIONS:  Reviewed and recorded. Current Outpatient Prescriptions  Medication Sig Dispense Refill  . atenolol (TENORMIN) 25 MG tablet Take 25 mg by mouth daily.        . Calcium Carbonate (CALCIUM 500 PO) Take 2 tablets by mouth at bedtime.      . colchicine 0.6 MG tablet Take 0.6 mg by mouth daily.        . cyanocobalamin (,VITAMIN B-12,) 1000 MCG/ML injection Inject 1,000 mcg into the muscle every 14 (fourteen) days.      Marland Kitchen doxycycline (DORYX) 100 MG EC tablet Take 100 mg by mouth 2 (two) times daily. Long term for face breakout      . fish oil-omega-3 fatty acids 1000 MG capsule Take 1  capsule (1 g total) by mouth 2 (two) times daily.      Marland Kitchen gabapentin (NEURONTIN) 100 MG capsule Take 100 mg by mouth 2 (two) times daily.       . Magnesium Oxide 420 MG TABS Take 1 tablet by mouth 2 (two) times daily.       . Multiple Vitamin (MULTIVITAMIN) tablet Take 1 tablet by mouth daily.        Marland Kitchen omeprazole (PRILOSEC) 20 MG capsule Take 20 mg by mouth daily.        Marland Kitchen PARoxetine (PAXIL) 40 MG tablet Take 40 mg by mouth at bedtime.        . potassium citrate (UROCIT-K) 10 MEQ (1080 MG) SR tablet Take 10 mEq by mouth 2 (two) times daily.      Marland Kitchen terazosin (HYTRIN) 2 MG capsule Take 2 mg by mouth at bedtime.       . traZODone (DESYREL) 150 MG tablet Take 150 mg by mouth at bedtime.        Marland Kitchen HYDROMET 5-1.5 MG/5ML syrup Take 5 mLs by mouth at bedtime as needed.       No current facility-administered medications for this visit.    ALLERGIES:  Ativan caused agitation.  Allopurinol apparently caused abnormal lab values.   SMOKING HISTORY:  The patient has smoked 1 pack of cigarettes a day for 30 years.  He stopped smoking at  about age 81.   HISTORY:  Derek [name] is a 72 year old white married male whom I am asked to see in consultation by Dr. Jeneen Rinks [name] for evaluation of thrombocytopenia.  This patient has had thrombocytopenia documented since 2006.  I saw this patient during a hospitalization in early March 2006 when he was admitted for pancytopenia which we determined to be due to the potentiation of his chronic 6MP therapy by allopurinol. During that hospitalization, the patient also had a pulmonary embolism from a small DVT in his left leg.  His hypercoagulable workup at that time was negative.  However, I do not think he ever had a prothrombin II gene mutational analysis.  His lupus anticoagulant and ANA were negative.  I do not think he had cardiolipin antibodies or the beta 2 glycoprotein I antibodies checked.  Nevertheless, the patient came off Coumadin after  9 months of therapy and has done well without any recurrent thromboembolic events.  When I saw the patient back in April of 2006, his platelet count was 126,000.  On July 17, 2005 platelet count was 184,000 and on 07/23/2005 114,000.  If we go back to May of 2012 platelet counts were 71,000 and 67,000.  On August 21, 2011 platelet count was 111,000.  On 08/25/2011, 101,000. On 08/25/2011 101,000 and on 05/09/2012, 115,000. Platelet counts from earlier this year, specifically February and March of 2014, have been running in the 70,000 to 90,000 range.  SURGICAL HISTORY: 1. The patient underwent 2 operations with bowel resections in 1978     for Crohn's disease. 2. Right inguinal hernia repair in the mid 1990s. 3. Laparoscopic cholecystectomy in the mid 1990s. 4. Right foot surgery for tophaceous gout in the mid 1990s. 5. Lithotripsy several times most recently in the mid 1990s. 6. Left knee arthroscopic surgery in early 2000. No history of injuries.  The patient has had blood transfusions and that is apparently the etiology of his hepatitis C infection.  Blood transfusions were given in the late 1970s.   FAMILY HISTORY: Mother died at age 68 of a ruptured abdominal aortic aneurysm.  She had respiratory illness.  She was a smoker.  Father died at age 68 of kidney failure.  Apparently he had heart problems.  A sister died at age 39 of a ruptured cerebral aneurysm.  A sister is alive at age 44.  She has hypertension.  The patient has 2 adult children, a daughter age 13 and a son age 43 in good health.  There are 4 grandchildren in good health. There is a strong family history of hypertension.  No history of Crohn's disease, blood clots, cancer or blood disorders, specifically Thrombocytopenia.   SOCIAL HISTORY: Patient smoked a pack of cigarettes a day for 30 years.  He stopped smoking at about age 21.  No use of alcohol.  He has been married for 41 years to his wife, [name].   They live in [place].  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  He volunteers with [employer].  He likes to build furniture.  He is fairly active.  No limitations of activity.  REVIEW OF SYSTEMS:  Patient has a mild upper respiratory infection.  He denies any fever.  He completed a course of Levaquin recently.  Aside from his cold, the patient feels generally well.  Energy is good.  No neurologic problems.  He wears glasses.  He suffered trauma to his left eye  many years ago, has a dilated pupil.  Also has a developing cataract.  Vision is good.  He has some bilateral hearing loss, will be needing hearing aids soon.  He has had some sinus problems.  He also has had some recent hay fever with itching of his eyes.  Weight has been stable.  Appetite is good.  He has some mild dyspepsia controlled with Protonix.  He has about 5 bowel movements, semi-formed.  No nocturnal stools.  No blood or melena.  No abdominal pain.  He had jaundice in the late 1970s and has a history of hepatitis C infection.  He denies any cardiac symptoms, specifically arrhythmias or exertional chest discomfort.  No respiratory symptoms.  He was on Coumadin for 9 months following his history of pulmonary emboli and DVT.  GU:  He has some decrease in his stream.  He has 1-time nocturia.  He also has a history of kidney stones.  No swelling of the legs.  He has the history of left lower extremity DVT which developed in the hospital in 2006.  Also had a pulmonary emboli at that time.  No history of intermittent claudication. He bleeds easily.  Denies any epistaxis.  He also bruises easily.  When he takes aspirin, bleeding and bruising is somewhat worse.  He has some bursitis involving his left shoulder, under the care of Dr. Erasmo Score. He has had injections.  No history of fever.  He has chronic sweating at night but uses an electric blanket year around.  No history  of infections.  He has some rosacea involving his face for which he takes doxycycline.  He has a history of PTSD.  I believe he served in the [place] War.   PHYSICAL EXAMINATION:  On physical exam, the patient is well developed and well nourished, looks healthy.  He sounds a little congested. Weight is 208 pounds, 4.8 ounces.  Height 5 feet 11 inches. Body surface area 2.18 sq m.  Blood pressure 106/71.  Pulse 51 and regular. Respirations regular and unlabored.  He is afebrile.  He wears glasses. The left pupil is round, regular but larger than the right pupil.  I could never determine that the left pupil reacted to light.  Mouth and pharynx are benign.  Dentition is good.  There is no peripheral adenopathy palpable in the neck.  No obvious thyroid enlargement.  Back: No skeletal tenderness or deformity.  Heart/lungs:  Normal.  Abdomen somewhat resistant but benign.  The patient has had multiple surgeries, particularly on the right side.  No organomegaly or masses or tenderness.  No obvious ascites.  No axillary or inguinal adenopathy. Extremities:  No peripheral edema, clubbing, palmar erythema, petechiae, or purpura.  Neurologic:  Grossly normal.  LABORATORY DATA:  Today, white count 4.6, ANC 3.2, hemoglobin 14.1, hematocrit 41.8, platelets 74,000.  Inspection of the peripheral smear discloses large platelets in general, and some giant platelets were seen.  Red cells were round, regular, normal size.  No myeloid or erythroid precursors and no dyspoietic changes.  I did not see any significant platelet clumping.  Platelet count from 05/09/2012 was 115,000.  Chemistries today are pending, including an LDH.  Vitamin B12 level today is also pending.  A BMET from 08/22/2012 showed a BUN of 23, creatinine 1.8, and an estimated GFR of 41.  IMAGING STUDIES:   1. CT scan of the abdomen and pelvis carried out without IV contrast on 08/21/2011 showed a nasogastric tube in place but  coiled upon itself and thus needed to be repositioned.  There was mid and distal small bowel dilatation to the level of a transition point in the right lower quadrant leading to decompressed distal-most small bowel. Unenhanced liver, spleen, adrenal glands, pancreas were all unremarkable. 2. MRA of the chest with or without contrast on 09/05/2012 showed aortic root aneurysm measuring 4.7 cm.  Ascending aortic aorta was mildly dilated.  There was mild aortic insufficiency. 3. Aortic MRA showed dilatation of the sinus of Valsalva.  There was moderate dilatation of the ascending aorta measuring 4.3 x 4.4 cm. There was significant intimal thickening of the descending thoracic aorta with no intramural hematoma, measuring 2.9 x 3.1 cm.   IMPRESSION/PLAN:  This patient has stable chronic thrombocytopenia dating back to early part of 2006.  There have been some fluctuations in the platelet count.  I had seen the patient on a few occasions during 2006.  On my last visit with the patient in late December 2007, I was thinking that the patient may have immune dysregulation, i.e. chronic idiopathic thrombocytopenic purpura.  The possibility that hepatitis C may also be playing a role in this thrombocytopenia cannot be excluded. It is of interest to note that in December 2006 the platelet count was 184,000.  The patient was reassured.  I do not think he needs any further workup at this time.  He has never had a bone marrow exam nor do I think that one is indicated.  His spleen was not enlarged on the CT scan of May 2012.  If the patient's platelet count were to drop then I might would consider a trial of prednisone.  As stated, the patient was reassured.  I have asked Derek [name] to return in approximately 6 months, at which time we will check CBC and chemistries.    ______________________________ Jeanie Cooks, M.D. DSM/MEDQ  D:  11/23/2012  T:  11/24/2012  Job:  [id number]

## 2012-11-24 NOTE — Progress Notes (Signed)
Derek [name], thanks for the excellent note. I used to check Derek [name] Hep C RNA by PCR on regular basis, but it always showed undetectable levels of the viral RNA, so I stopped checking it, but we can check it again when he comes for next appointment.He has had normal synthetic function of the liver, so I don't think he has  any liver disease, specifically not causing thrombocytopenia. Thanx DB

## 2012-11-25 ENCOUNTER — Ambulatory Visit (HOSPITAL_COMMUNITY)
Admission: RE | Admit: 2012-11-25 | Discharge: 2012-11-25 | Disposition: A | Discharge status: Home / Self Care | Payer: Medicare Other | Source: Ambulatory Visit | Attending: Family Medicine | Admitting: Family Medicine

## 2012-11-25 ENCOUNTER — Other Ambulatory Visit (HOSPITAL_COMMUNITY): Payer: Self-pay | Admitting: Family Medicine

## 2012-11-25 DIAGNOSIS — R05 Cough: Secondary | ICD-10-CM | POA: Diagnosis not present

## 2012-11-25 DIAGNOSIS — R0602 Shortness of breath: Secondary | ICD-10-CM | POA: Insufficient documentation

## 2012-11-25 DIAGNOSIS — R0989 Other specified symptoms and signs involving the circulatory and respiratory systems: Secondary | ICD-10-CM | POA: Insufficient documentation

## 2012-11-25 DIAGNOSIS — J309 Allergic rhinitis, unspecified: Secondary | ICD-10-CM | POA: Diagnosis not present

## 2012-11-25 DIAGNOSIS — R059 Cough, unspecified: Secondary | ICD-10-CM | POA: Diagnosis not present

## 2012-11-25 DIAGNOSIS — J069 Acute upper respiratory infection, unspecified: Secondary | ICD-10-CM | POA: Diagnosis not present

## 2012-11-25 DIAGNOSIS — Z6827 Body mass index (BMI) 27.0-27.9, adult: Secondary | ICD-10-CM | POA: Diagnosis not present

## 2012-12-07 DIAGNOSIS — L723 Sebaceous cyst: Secondary | ICD-10-CM | POA: Diagnosis not present

## 2012-12-07 DIAGNOSIS — L570 Actinic keratosis: Secondary | ICD-10-CM | POA: Diagnosis not present

## 2012-12-30 DIAGNOSIS — M25519 Pain in unspecified shoulder: Secondary | ICD-10-CM | POA: Diagnosis not present

## 2012-12-30 DIAGNOSIS — M25819 Other specified joint disorders, unspecified shoulder: Secondary | ICD-10-CM | POA: Diagnosis not present

## 2013-01-06 DIAGNOSIS — M25519 Pain in unspecified shoulder: Secondary | ICD-10-CM | POA: Diagnosis not present

## 2013-01-23 DIAGNOSIS — M109 Gout, unspecified: Secondary | ICD-10-CM | POA: Diagnosis not present

## 2013-01-23 DIAGNOSIS — B1710 Acute hepatitis C without hepatic coma: Secondary | ICD-10-CM | POA: Diagnosis not present

## 2013-01-23 DIAGNOSIS — D696 Thrombocytopenia, unspecified: Secondary | ICD-10-CM | POA: Diagnosis not present

## 2013-01-23 DIAGNOSIS — N183 Chronic kidney disease, stage 3 unspecified: Secondary | ICD-10-CM | POA: Diagnosis not present

## 2013-01-25 DIAGNOSIS — M25819 Other specified joint disorders, unspecified shoulder: Secondary | ICD-10-CM | POA: Diagnosis not present

## 2013-01-25 DIAGNOSIS — M19019 Primary osteoarthritis, unspecified shoulder: Secondary | ICD-10-CM | POA: Diagnosis not present

## 2013-01-27 ENCOUNTER — Other Ambulatory Visit (HOSPITAL_COMMUNITY): Payer: Self-pay | Admitting: Urology

## 2013-01-27 DIAGNOSIS — N2889 Other specified disorders of kidney and ureter: Secondary | ICD-10-CM

## 2013-01-30 DIAGNOSIS — M25819 Other specified joint disorders, unspecified shoulder: Secondary | ICD-10-CM | POA: Diagnosis not present

## 2013-02-01 DIAGNOSIS — H1045 Other chronic allergic conjunctivitis: Secondary | ICD-10-CM | POA: Diagnosis not present

## 2013-02-01 DIAGNOSIS — H01009 Unspecified blepharitis unspecified eye, unspecified eyelid: Secondary | ICD-10-CM | POA: Diagnosis not present

## 2013-02-01 DIAGNOSIS — H10029 Other mucopurulent conjunctivitis, unspecified eye: Secondary | ICD-10-CM | POA: Diagnosis not present

## 2013-02-02 ENCOUNTER — Encounter (HOSPITAL_COMMUNITY): Payer: Self-pay | Admitting: Emergency Medicine

## 2013-02-02 ENCOUNTER — Emergency Department (HOSPITAL_COMMUNITY)
Admission: EM | Admit: 2013-02-02 | Discharge: 2013-02-02 | Disposition: A | Discharge status: Home / Self Care | Payer: Medicare Other | Attending: Emergency Medicine | Admitting: Emergency Medicine

## 2013-02-02 DIAGNOSIS — N183 Chronic kidney disease, stage 3 unspecified: Secondary | ICD-10-CM | POA: Diagnosis not present

## 2013-02-02 DIAGNOSIS — S61209A Unspecified open wound of unspecified finger without damage to nail, initial encounter: Secondary | ICD-10-CM | POA: Diagnosis not present

## 2013-02-02 DIAGNOSIS — S61219A Laceration without foreign body of unspecified finger without damage to nail, initial encounter: Secondary | ICD-10-CM

## 2013-02-02 DIAGNOSIS — Z86711 Personal history of pulmonary embolism: Secondary | ICD-10-CM | POA: Insufficient documentation

## 2013-02-02 DIAGNOSIS — Z87442 Personal history of urinary calculi: Secondary | ICD-10-CM | POA: Diagnosis not present

## 2013-02-02 DIAGNOSIS — Z87891 Personal history of nicotine dependence: Secondary | ICD-10-CM | POA: Insufficient documentation

## 2013-02-02 DIAGNOSIS — Z862 Personal history of diseases of the blood and blood-forming organs and certain disorders involving the immune mechanism: Secondary | ICD-10-CM | POA: Diagnosis not present

## 2013-02-02 DIAGNOSIS — I129 Hypertensive chronic kidney disease with stage 1 through stage 4 chronic kidney disease, or unspecified chronic kidney disease: Secondary | ICD-10-CM | POA: Diagnosis not present

## 2013-02-02 DIAGNOSIS — K219 Gastro-esophageal reflux disease without esophagitis: Secondary | ICD-10-CM | POA: Insufficient documentation

## 2013-02-02 DIAGNOSIS — Z8719 Personal history of other diseases of the digestive system: Secondary | ICD-10-CM | POA: Diagnosis not present

## 2013-02-02 DIAGNOSIS — G2581 Restless legs syndrome: Secondary | ICD-10-CM | POA: Diagnosis not present

## 2013-02-02 DIAGNOSIS — Z79899 Other long term (current) drug therapy: Secondary | ICD-10-CM | POA: Insufficient documentation

## 2013-02-02 DIAGNOSIS — Z8679 Personal history of other diseases of the circulatory system: Secondary | ICD-10-CM | POA: Diagnosis not present

## 2013-02-02 DIAGNOSIS — Y929 Unspecified place or not applicable: Secondary | ICD-10-CM | POA: Insufficient documentation

## 2013-02-02 DIAGNOSIS — Y9389 Activity, other specified: Secondary | ICD-10-CM | POA: Insufficient documentation

## 2013-02-02 DIAGNOSIS — W268XXA Contact with other sharp object(s), not elsewhere classified, initial encounter: Secondary | ICD-10-CM | POA: Insufficient documentation

## 2013-02-02 DIAGNOSIS — Z8659 Personal history of other mental and behavioral disorders: Secondary | ICD-10-CM | POA: Diagnosis not present

## 2013-02-02 DIAGNOSIS — Z8639 Personal history of other endocrine, nutritional and metabolic disease: Secondary | ICD-10-CM | POA: Insufficient documentation

## 2013-02-02 DIAGNOSIS — Z8619 Personal history of other infectious and parasitic diseases: Secondary | ICD-10-CM | POA: Diagnosis not present

## 2013-02-02 MED ORDER — LIDOCAINE HCL (PF) 1 % IJ SOLN
INTRAMUSCULAR | Status: AC
  Administered 2013-02-02: 11:00:00
  Filled 2013-02-02: qty 10

## 2013-02-02 MED ORDER — BACITRACIN-NEOMYCIN-POLYMYXIN 400-5-5000 EX OINT
TOPICAL_OINTMENT | Freq: Once | CUTANEOUS | Status: AC
  Administered 2013-02-02: 11:00:00 via TOPICAL
  Filled 2013-02-02: qty 1

## 2013-02-02 NOTE — ED Provider Notes (Signed)
History    CSN: [id number] Arrival date & time 02/02/13  1019  First MD Initiated Contact with Patient 02/02/13 1027     Chief Complaint  Patient presents with  . Laceration   (Consider location/radiation/quality/duration/timing/severity/associated sxs/prior Treatment) Patient is a 72 y.o. male presenting with skin laceration. The history is provided by the patient.  Laceration Location:  Finger Finger laceration location:  L index finger Length (cm):  1.5 Depth:  Through dermis Quality: straight   Bleeding: controlled   Time since incident:  3 minutes Injury mechanism: saw. Pain details:    Quality:  Burning   Severity:  Mild   Timing:  Constant   Progression:  Unchanged Foreign body present:  No foreign bodies Relieved by:  None tried Worsened by:  Nothing tried Ineffective treatments:  None tried Tetanus status:  Up to date  Derek [name] is a 72 y.o. male who presents to the ED with a laceration to the left index finger. He was using a saw to do working and it slipped and he cut his finger. Denies any other injuries.   Past Medical History  Diagnosis Date  . Hiatal hernia   . GERD (gastroesophageal reflux disease)   . Gout   . History of pulmonary embolism 2006    from DVT lower extremity  . History of gastritis   . Internal hemorrhoids   . Post-traumatic stress syndrome   . History of hepatitis C   . B12 deficiency   . History of small bowel obstruction   . Pancytopenia     Hx of  . Crohn's disease   . RLS (restless legs syndrome)   . Hyperoxaluria     Intestinal  . Hypertension   . Nephrolithiasis   . CKD (chronic kidney disease) stage 3, GFR 30-59 ml/min 08/22/2011  . Pancreatitis 2010    elevated lipase and amylase, stranding in tail of pancreas, ? from Humira  . Rosacea conjunctivitis(372.31)   . PTSD (post-traumatic stress disorder)   . Small bowel obstruction   . Anxiety   . Thrombocytopenia    Past Surgical History  Procedure  Laterality Date  . Hernia repair    . Cholecystectomy    . Hemicolectomy      Right  . Bowel resection    . Knee arthroscopy      Left  . Ligament repair      Left knee  . Ankle surgery      Right  . Foot surgery      Right  . Terminal ileum resection     Family History  Problem Relation Age of Onset  . Kidney disease Father   . Colon cancer Neg Hx   . Esophageal cancer Neg Hx   . Stomach cancer Neg Hx   . Rectal cancer Neg Hx    History  Substance Use Topics  . Smoking status: Former Research scientist (life sciences)  . Smokeless tobacco: Never Used     Comment: quit in late 1970's  . Alcohol Use: No    Review of Systems  Constitutional: Negative for fever.  Respiratory: Negative for shortness of breath.   Cardiovascular: Negative for chest pain.  Gastrointestinal: Negative for nausea and vomiting.  Skin: Positive for wound.  Psychiatric/Behavioral: The patient is not nervous/anxious.     Allergies  Allopurinol and Lorazepam  Home Medications   Current Outpatient Rx  Name  Route  Sig  Dispense  Refill  . atenolol (TENORMIN) 25 MG tablet   Oral  Take 25 mg by mouth daily.           . Calcium Carbonate (CALCIUM 500 PO)   Oral   Take 2 tablets by mouth at bedtime.         . colchicine 0.6 MG tablet   Oral   Take 0.6 mg by mouth daily.           . cyanocobalamin (,VITAMIN B-12,) 1000 MCG/ML injection   Intramuscular   Inject 1,000 mcg into the muscle every 14 (fourteen) days.         Marland Kitchen doxycycline (DORYX) 100 MG EC tablet   Oral   Take 100 mg by mouth 2 (two) times daily. Long term for face breakout         . fish oil-omega-3 fatty acids 1000 MG capsule   Oral   Take 1 capsule (1 g total) by mouth 2 (two) times daily.         Marland Kitchen gabapentin (NEURONTIN) 100 MG capsule   Oral   Take 100 mg by mouth 2 (two) times daily.          Marland Kitchen HYDROMET 5-1.5 MG/5ML syrup   Oral   Take 5 mLs by mouth at bedtime as needed.         . Magnesium Oxide 420 MG TABS   Oral    Take 1 tablet by mouth 2 (two) times daily.          . Multiple Vitamin (MULTIVITAMIN) tablet   Oral   Take 1 tablet by mouth daily.           Marland Kitchen omeprazole (PRILOSEC) 20 MG capsule   Oral   Take 20 mg by mouth daily.           Marland Kitchen PARoxetine (PAXIL) 40 MG tablet   Oral   Take 40 mg by mouth at bedtime.           . potassium citrate (UROCIT-K) 10 MEQ (1080 MG) SR tablet   Oral   Take 10 mEq by mouth 2 (two) times daily.         Marland Kitchen terazosin (HYTRIN) 2 MG capsule   Oral   Take 2 mg by mouth at bedtime.          . traZODone (DESYREL) 150 MG tablet   Oral   Take 150 mg by mouth at bedtime.            BP 115/84  Pulse 78  Temp(Src) 97.5 F (36.4 C) (Oral)  Resp 20  Ht 6' (1.829 m)  Wt 200 lb (90.719 kg)  BMI 27.12 kg/m2  SpO2 100% Physical Exam  Nursing note and vitals reviewed. Constitutional: He is oriented to person, place, and time. He appears well-developed and well-nourished. No distress.  HENT:  Head: Normocephalic.  Eyes: EOM are normal.  Neck: Neck supple.  Cardiovascular: Normal rate.   Pulmonary/Chest: Effort normal.  Abdominal: Soft. There is no tenderness.  Musculoskeletal: Normal range of motion.       Left hand: He exhibits laceration. He exhibits normal range of motion, no tenderness, normal capillary refill and no swelling. Normal sensation noted. Normal strength noted.       Hands: Neurological: He is alert and oriented to person, place, and time. He has normal strength. No cranial nerve deficit or sensory deficit.  Skin: Skin is warm and dry.  Psychiatric: He has a normal mood and affect. His behavior is normal.    ED Course  Procedures  LACERATION REPAIR Performed by: NEESE,HOPE Authorized by: NEESE,HOPE Consent: Verbal consent obtained. Risks and benefits: risks, benefits and alternatives were discussed Consent given by: patient Patient identity confirmed: provided demographic data Prepped and Draped in normal sterile  fashion Wound explored  Laceration Location: left index finger  Laceration Length: 1.5cm  No Foreign Bodies seen or palpated  Anesthesia: local infiltration  Local anesthetic: lidocaine 1% without epinephrine  Anesthetic total: 2 ml  Irrigation method: syringe Amount of cleaning: standard  Skin closure: 5-0 prolene  Number of sutures: 3  Technique: interrupted  Patient tolerance: Patient tolerated the procedure well with no immediate complications.  MDM  72 y.o. male with laceration to the left index finger. He will return for any problems. Sutures to be removed in 7 days by his PCP.  Discussed with the patient plan of care and all questioned fully answered. He will return if any problems arise.   [place], [place] 02/02/13 [phone]

## 2013-02-02 NOTE — ED Notes (Signed)
States that he was using a new saw and cut his left first digit.

## 2013-02-03 NOTE — ED Provider Notes (Signed)
Medical screening examination/treatment/procedure(s) were performed by non-physician practitioner and as supervising physician I was immediately available for consultation/collaboration.   Noe Pittsley B. Karle Starch, MD 02/03/13 [phone]

## 2013-03-03 ENCOUNTER — Ambulatory Visit (HOSPITAL_COMMUNITY)
Admission: RE | Admit: 2013-03-03 | Discharge: 2013-03-03 | Disposition: A | Discharge status: Home / Self Care | Payer: Medicare Other | Source: Ambulatory Visit | Attending: Urology | Admitting: Urology

## 2013-03-03 DIAGNOSIS — N2889 Other specified disorders of kidney and ureter: Secondary | ICD-10-CM

## 2013-03-03 DIAGNOSIS — N281 Cyst of kidney, acquired: Secondary | ICD-10-CM | POA: Diagnosis not present

## 2013-03-03 DIAGNOSIS — Z9089 Acquired absence of other organs: Secondary | ICD-10-CM | POA: Insufficient documentation

## 2013-03-03 DIAGNOSIS — R161 Splenomegaly, not elsewhere classified: Secondary | ICD-10-CM | POA: Insufficient documentation

## 2013-03-03 DIAGNOSIS — K7689 Other specified diseases of liver: Secondary | ICD-10-CM | POA: Insufficient documentation

## 2013-03-03 DIAGNOSIS — N269 Renal sclerosis, unspecified: Secondary | ICD-10-CM | POA: Insufficient documentation

## 2013-03-03 LAB — CREATININE, SERUM: Creatinine, Ser: 1.84 mg/dL — ABNORMAL HIGH (ref 0.50–1.35)

## 2013-03-03 MED ORDER — GADOBENATE DIMEGLUMINE 529 MG/ML IV SOLN
10.0000 mL | Freq: Once | INTRAVENOUS | Status: AC | PRN
  Administered 2013-03-03: 10 mL via INTRAVENOUS

## 2013-03-06 DIAGNOSIS — N183 Chronic kidney disease, stage 3 unspecified: Secondary | ICD-10-CM | POA: Diagnosis not present

## 2013-03-08 ENCOUNTER — Other Ambulatory Visit: Payer: Self-pay

## 2013-03-13 DIAGNOSIS — M25519 Pain in unspecified shoulder: Secondary | ICD-10-CM | POA: Diagnosis not present

## 2013-03-13 DIAGNOSIS — M25819 Other specified joint disorders, unspecified shoulder: Secondary | ICD-10-CM | POA: Diagnosis not present

## 2013-03-16 DIAGNOSIS — B9689 Other specified bacterial agents as the cause of diseases classified elsewhere: Secondary | ICD-10-CM | POA: Diagnosis not present

## 2013-03-16 DIAGNOSIS — L708 Other acne: Secondary | ICD-10-CM | POA: Diagnosis not present

## 2013-03-16 DIAGNOSIS — A499 Bacterial infection, unspecified: Secondary | ICD-10-CM | POA: Diagnosis not present

## 2013-04-19 DIAGNOSIS — M25819 Other specified joint disorders, unspecified shoulder: Secondary | ICD-10-CM | POA: Diagnosis not present

## 2013-05-08 DIAGNOSIS — L708 Other acne: Secondary | ICD-10-CM | POA: Diagnosis not present

## 2013-05-12 DIAGNOSIS — M25819 Other specified joint disorders, unspecified shoulder: Secondary | ICD-10-CM | POA: Diagnosis not present

## 2013-05-22 DIAGNOSIS — E7489 Other specified disorders of carbohydrate metabolism: Secondary | ICD-10-CM | POA: Diagnosis not present

## 2013-05-22 DIAGNOSIS — M109 Gout, unspecified: Secondary | ICD-10-CM | POA: Diagnosis not present

## 2013-05-22 DIAGNOSIS — N183 Chronic kidney disease, stage 3 unspecified: Secondary | ICD-10-CM | POA: Diagnosis not present

## 2013-05-22 DIAGNOSIS — E213 Hyperparathyroidism, unspecified: Secondary | ICD-10-CM | POA: Diagnosis not present

## 2013-05-22 DIAGNOSIS — R82998 Other abnormal findings in urine: Secondary | ICD-10-CM | POA: Diagnosis not present

## 2013-05-26 ENCOUNTER — Ambulatory Visit: Payer: Medicare Other

## 2013-05-26 ENCOUNTER — Other Ambulatory Visit: Payer: Self-pay | Admitting: Internal Medicine

## 2013-05-26 ENCOUNTER — Other Ambulatory Visit: Payer: Medicare Other | Admitting: Lab

## 2013-05-26 ENCOUNTER — Other Ambulatory Visit: Payer: Self-pay

## 2013-05-26 DIAGNOSIS — D61818 Other pancytopenia: Secondary | ICD-10-CM

## 2013-05-29 ENCOUNTER — Telehealth: Payer: Self-pay | Admitting: Internal Medicine

## 2013-05-29 NOTE — Telephone Encounter (Signed)
Per POF tried to rs missed Lab and OV but wife refuses appt for pt bc pt seeing his kidney doctor and having labs there and kidney dr told them everything was ok.  Pts wife wants to wait until a return visit to kidney dr in couple of weeks and will then call if feels he needs return appt here.  Kidney Dr is Dr.  Jimmy Footman. Emailed Dr. Juliann Mule and Riesa Pope. [facility]

## 2013-05-30 ENCOUNTER — Telehealth: Payer: Self-pay | Admitting: Internal Medicine

## 2013-05-30 NOTE — Telephone Encounter (Signed)
Per Dr Sande Rives 10/28 staff message I called pt and adv he should have his outside labs faxed to this ofc and that  Dr Juliann Mule requests he be followed here.  Pt stated he does not wish to make an appt at this time but did agree to call and get an appt if his kidney dr said he needs to.  Notified Dr Juliann Mule via email...[facility]

## 2013-05-31 DIAGNOSIS — M25819 Other specified joint disorders, unspecified shoulder: Secondary | ICD-10-CM | POA: Diagnosis not present

## 2013-06-02 ENCOUNTER — Ambulatory Visit: Payer: Medicare Other

## 2013-06-02 ENCOUNTER — Other Ambulatory Visit: Payer: Medicare Other | Admitting: Lab

## 2013-06-08 ENCOUNTER — Other Ambulatory Visit: Payer: Self-pay

## 2013-06-12 DIAGNOSIS — N183 Chronic kidney disease, stage 3 unspecified: Secondary | ICD-10-CM | POA: Diagnosis not present

## 2013-08-07 DIAGNOSIS — M19019 Primary osteoarthritis, unspecified shoulder: Secondary | ICD-10-CM | POA: Diagnosis not present

## 2013-08-14 ENCOUNTER — Telehealth: Payer: Self-pay | Admitting: *Deleted

## 2013-08-14 NOTE — Telephone Encounter (Signed)
Received surgical clearance request from Dr Onnie Graham for  shoulder surgery scheduled for 09/21/13-per Dr Orpah Greek should have MRA chest/Echo and follow up appt with him after testing has been done prior to surgery. Pt's wife advised, verbalized understanding, will forward to [facility] to call pt to schedule appts prior to surgery.

## 2013-08-14 NOTE — Telephone Encounter (Signed)
The orders for echo and MRA of chest are already in Epic.

## 2013-08-15 ENCOUNTER — Telehealth: Payer: Self-pay | Admitting: Cardiology

## 2013-08-15 NOTE — Telephone Encounter (Signed)
Received request from Nurse fax box, documents faxed for surgical clearance. To: [place] Fax number: [phone] [place]

## 2013-08-16 ENCOUNTER — Encounter: Payer: Self-pay | Admitting: Cardiology

## 2013-08-16 ENCOUNTER — Ambulatory Visit (HOSPITAL_COMMUNITY): Payer: Medicare Other | Attending: Cardiology | Admitting: Radiology

## 2013-08-16 DIAGNOSIS — I712 Thoracic aortic aneurysm, without rupture, unspecified: Secondary | ICD-10-CM | POA: Diagnosis not present

## 2013-08-16 DIAGNOSIS — N183 Chronic kidney disease, stage 3 unspecified: Secondary | ICD-10-CM

## 2013-08-16 DIAGNOSIS — I129 Hypertensive chronic kidney disease with stage 1 through stage 4 chronic kidney disease, or unspecified chronic kidney disease: Secondary | ICD-10-CM | POA: Diagnosis not present

## 2013-08-16 DIAGNOSIS — I359 Nonrheumatic aortic valve disorder, unspecified: Secondary | ICD-10-CM | POA: Insufficient documentation

## 2013-08-16 DIAGNOSIS — I7121 Aneurysm of the ascending aorta, without rupture: Secondary | ICD-10-CM

## 2013-08-16 DIAGNOSIS — N189 Chronic kidney disease, unspecified: Secondary | ICD-10-CM | POA: Diagnosis not present

## 2013-08-16 DIAGNOSIS — I351 Nonrheumatic aortic (valve) insufficiency: Secondary | ICD-10-CM

## 2013-08-16 DIAGNOSIS — D696 Thrombocytopenia, unspecified: Secondary | ICD-10-CM

## 2013-08-16 NOTE — Progress Notes (Signed)
Echocardiogram performed.  

## 2013-08-28 ENCOUNTER — Ambulatory Visit (HOSPITAL_COMMUNITY)
Admission: RE | Admit: 2013-08-28 | Discharge: 2013-08-28 | Disposition: A | Discharge status: Home / Self Care | Payer: Medicare Other | Source: Ambulatory Visit | Attending: Cardiology | Admitting: Cardiology

## 2013-08-28 DIAGNOSIS — I351 Nonrheumatic aortic (valve) insufficiency: Secondary | ICD-10-CM

## 2013-08-28 DIAGNOSIS — D696 Thrombocytopenia, unspecified: Secondary | ICD-10-CM

## 2013-08-28 DIAGNOSIS — I712 Thoracic aortic aneurysm, without rupture, unspecified: Secondary | ICD-10-CM | POA: Diagnosis not present

## 2013-08-28 DIAGNOSIS — I719 Aortic aneurysm of unspecified site, without rupture: Secondary | ICD-10-CM | POA: Diagnosis not present

## 2013-08-28 DIAGNOSIS — N183 Chronic kidney disease, stage 3 unspecified: Secondary | ICD-10-CM

## 2013-08-28 DIAGNOSIS — I7121 Aneurysm of the ascending aorta, without rupture: Secondary | ICD-10-CM

## 2013-08-28 LAB — CREATININE, SERUM
CREATININE: 1.69 mg/dL — AB (ref 0.50–1.35)
GFR calc Af Amer: 46 mL/min — ABNORMAL LOW (ref >90)
GFR calc non Af Amer: 39 mL/min — ABNORMAL LOW (ref >90)

## 2013-08-28 MED ORDER — GADOBENATE DIMEGLUMINE 529 MG/ML IV SOLN
20.0000 mL | Freq: Once | INTRAVENOUS | Status: AC
  Administered 2013-08-28: 17 mL via INTRAVENOUS

## 2013-09-01 DIAGNOSIS — M19019 Primary osteoarthritis, unspecified shoulder: Secondary | ICD-10-CM | POA: Diagnosis not present

## 2013-09-05 ENCOUNTER — Encounter (HOSPITAL_COMMUNITY): Payer: Self-pay | Admitting: Pharmacy Technician

## 2013-09-06 ENCOUNTER — Encounter: Payer: Self-pay | Admitting: *Deleted

## 2013-09-06 ENCOUNTER — Ambulatory Visit (INDEPENDENT_AMBULATORY_CARE_PROVIDER_SITE_OTHER): Payer: Medicare Other | Admitting: Cardiology

## 2013-09-06 ENCOUNTER — Encounter: Payer: Self-pay | Admitting: Cardiology

## 2013-09-06 VITALS — BP 118/62 | HR 50 | Ht 72.0 in | Wt 207.0 lb

## 2013-09-06 DIAGNOSIS — R0602 Shortness of breath: Secondary | ICD-10-CM

## 2013-09-06 DIAGNOSIS — N183 Chronic kidney disease, stage 3 unspecified: Secondary | ICD-10-CM

## 2013-09-06 DIAGNOSIS — Z8489 Family history of other specified conditions: Secondary | ICD-10-CM | POA: Diagnosis not present

## 2013-09-06 DIAGNOSIS — Z8249 Family history of ischemic heart disease and other diseases of the circulatory system: Secondary | ICD-10-CM

## 2013-09-06 DIAGNOSIS — I712 Thoracic aortic aneurysm, without rupture, unspecified: Secondary | ICD-10-CM | POA: Diagnosis not present

## 2013-09-06 DIAGNOSIS — I429 Cardiomyopathy, unspecified: Secondary | ICD-10-CM

## 2013-09-06 DIAGNOSIS — I359 Nonrheumatic aortic valve disorder, unspecified: Secondary | ICD-10-CM

## 2013-09-06 DIAGNOSIS — I428 Other cardiomyopathies: Secondary | ICD-10-CM

## 2013-09-06 DIAGNOSIS — D696 Thrombocytopenia, unspecified: Secondary | ICD-10-CM

## 2013-09-06 DIAGNOSIS — R109 Unspecified abdominal pain: Secondary | ICD-10-CM

## 2013-09-06 DIAGNOSIS — I7121 Aneurysm of the ascending aorta, without rupture: Secondary | ICD-10-CM

## 2013-09-06 NOTE — Patient Instructions (Signed)
Your physician has requested that you have a lexiscan myoview. For further information please visit [email or web address]. Please follow instruction sheet, as given.  Your physician has requested that you have an abdominal aorta duplex. During this test, an ultrasound is used to evaluate the aorta. Allow 30 minutes for this exam. Do not eat after midnight the day before and avoid carbonated beverages  You have been referred to Dr Servando Snare at [facility].  Your physician recommends that you schedule a follow-up appointment in: 4 months with Dr Aundra Dubin.  Take aspirin 75m every other day.

## 2013-09-07 ENCOUNTER — Other Ambulatory Visit (HOSPITAL_COMMUNITY): Payer: Self-pay | Admitting: Cardiology

## 2013-09-07 DIAGNOSIS — I428 Other cardiomyopathies: Secondary | ICD-10-CM | POA: Insufficient documentation

## 2013-09-07 DIAGNOSIS — I700 Atherosclerosis of aorta: Secondary | ICD-10-CM

## 2013-09-07 NOTE — Progress Notes (Signed)
Patient ID: Derek [name], male   DOB: Jun 07, 1941, 73 y.o.   MRN: [id number] PCP: Dr. Hilma Favors  73 yo with history of ascending aortic and aortic root aneurysm as well as Crohns disease presents for cardiology followup. MRA chest in 2/14 showed 4.7 cm aortic root at the sinuses of Valsalva.  Repeat MRA chest 1/15 showed 5.1 cm aortic root at the sinuses of Valsalva.  Echo (1/15) showed 4.7 cm aortic root, 5.0 cm ascending aorta with mild AI. He is on a beta blocker but no ARB due to CKD.  The echo in 1/15 also showed EF decreased to 45-50% (had been normal prior).  Patient denies chest pain or exertional dyspnea.  He can walk up a flight of steps without problems.  He has severe left shoulder pain and needs left shoulder replacement. He is not taking aspirin because of easy bruising.   Labs (1/13): K 3.3, creatinine 1.52, plts 96,000 Labs (3/13): K 4.9, creatinine 1.99 Labs (10/13): plts 115 Labs (1/14): K 4, creatinine 1.8 Labs (2/14): LDL 61, HDL 45 Labs (4/14): plts 74,000 Labs (1/15): creatinine 1.69  ECG: NSR rate 50  PMH: 1. Gout 2. Depression 3. BPH 4. Ascending aortic aneurysm: MRA chest (7/13): 4.4 cm ascending aorta at sinotubular junction.  Echo (8/13) with EF 55-60%, mild AI, aortic root 4.8 cm.  MRA chest (2/14) with 4.7 cm aortic root at the sinuses of Valsalva, 4.0 cm ascending aorta.  Aortic valve is trileaflet.  Echo (1/15) with EF 45-50%, diffuse hypokinesis, 4.7 cm aortic root and 5.0 cm ascending aorta.  MRA (1/15) with 5.1 cm aneurysm at sinuses of Valsalva, 4.2 cm ascending aortic aneurysm.   5. Crohns Disease: H/o SBOs.  Patient has had resection of the terminal ileum.  H/o active Crohns at the ileo-colic anastomosis.  6. H/o PE/DVT in 2006.  7. GERD/hiatal hernia 8. PTSD 9. HCV antibody positive but viral RNA negative by PCR 10. B12 deficiency 11. Restless leg syndrome.  12. HTN 13. Nephrolithiasis. 14. CKD: followed by Dr. Jimmy Footman.  15. H/o pancreatitis from  Humira 16. Chronic thrombocytopenia 17. H/o CCY 18. Per report, abnormal cardiolite in 2011.  No cath because not particularly symptomatic and has CKD.  19. Abdominal CT in 5/12 showed no AAA.  20. Cardiomyopathy: EF 45-50% by echo in 1/15 (diffuse hypokinesis).  21. OA shoulder.   SH: Married with 2 children, lives in [place], retired Chief Financial Officer, prior smoker.   FH: Mother died from ruptured AAA, sister with SAH, father with "hole in his heart."   ROS: All systems reviewed and negative except as per HPI.   Current Outpatient Prescriptions  Medication Sig Dispense Refill  . atenolol (TENORMIN) 25 MG tablet Take 25 mg by mouth daily.        . Calcium Carbonate (CALCIUM 500 PO) Take 2 tablets by mouth 2 (two) times daily.       . colchicine 0.6 MG tablet Take 0.6 mg by mouth daily.        . cyanocobalamin (,VITAMIN B-12,) 1000 MCG/ML injection Inject 1,000 mcg into the muscle every 14 (fourteen) days.      . febuxostat (ULORIC) 40 MG tablet Take 80 mg by mouth daily.      . fish oil-omega-3 fatty acids 1000 MG capsule Take 1 capsule (1 g total) by mouth 2 (two) times daily.      Marland Kitchen gabapentin (NEURONTIN) 100 MG capsule Take 100 mg by mouth 2 (two) times daily.       Marland Kitchen  hydrOXYzine (VISTARIL) 25 MG capsule Take 25-50 mg by mouth 3 (three) times daily as needed for anxiety (May take 2 capsules at bedtime if needed).      Marland Kitchen loteprednol (LOTEMAX) 0.5 % ophthalmic suspension Place 1 drop into both eyes 2 (two) times daily.      . Magnesium Oxide 420 MG TABS Take 1 tablet by mouth 2 (two) times daily.       . minocycline (MINOCIN,DYNACIN) 100 MG capsule Take 100 mg by mouth daily.      . Multiple Vitamin (MULTIVITAMIN) tablet Take 1 tablet by mouth daily.        Marland Kitchen omeprazole (PRILOSEC) 20 MG capsule Take 20 mg by mouth daily.        Marland Kitchen PARoxetine (PAXIL) 40 MG tablet Take 40 mg by mouth at bedtime.        . potassium citrate (UROCIT-K) 10 MEQ (1080 MG) SR tablet Take 10 mEq by  mouth 2 (two) times daily.      Marland Kitchen terazosin (HYTRIN) 2 MG capsule Take 2 mg by mouth at bedtime.       . traZODone (DESYREL) 150 MG tablet Take 150 mg by mouth at bedtime.        Marland Kitchen aspirin EC 81 MG tablet 1 tablet every other day       No current facility-administered medications for this visit.    BP 118/62  Pulse 50  Ht 6' (1.829 m)  Wt 93.895 kg (207 lb)  BMI 28.07 kg/m2 General: NAD Neck: No JVD, no thyromegaly or thyroid nodule.  Lungs: Clear to auscultation bilaterally with normal respiratory effort. CV: Nondisplaced PMI.  Heart regular S1/S2, no S3/S4, no murmur.  1+ ankle edema.  No carotid bruit.  Normal pedal pulses.  Abdomen: Soft, nontender, no hepatosplenomegaly, no distention.  Neurologic: Alert and oriented x 3.  Psych: Normal affect. Extremities: No clubbing or cyanosis.   Assessment/Plan: 1. Sinus of valsalva aneurysm: 5.1 cm aortic root at sinuses of Valsalva, increased from last year. The aortic valve is trileaflet with mild AI. Continue atenolol.  Not on ARB because of CKD.  His aneurysm is now > 5 cm, I will refer him to Dr. Servando Snare (CVTS) for evaluation.  2. CKD: Stable CKD on last BMET.  3. History of thrombocytopenia: Not on ASA.  Last CBC showed mild thrombocytopenia.  He will try to take ASA 81 mg every other day.  4. Family history of ruptured abdominal aortic aneurysm: Will get abdominal US to evaluate abdominal aorta.  5. Cardiomyopathy: EF 45-50% on recent echo with diffuse hypokinesis.  No chest pain or exertional dyspnea.  He does not look volume overloaded on exam.  Last echo in 8/13 showed normal EF.  As he is being set up for shoulder surgery, I think that he will need a Lexiscan Cardiolite (will use pharmacological stress given aneurysm).  This will allow [place] to reassess his EF and assess for ischemia.  6. Preoperative cardiac evaluation: Patient has severe left shoulder arthritis and needs shoulder surgery (total replacement).  As above, will get  Cardiolite given some fall in EF compared to prior echo (though he is asymptomatic).  I am also going to have him evaluated by CVTS first given his > 5 cm aortic root aneurysm.    [name] 09/07/2013

## 2013-09-08 ENCOUNTER — Institutional Professional Consult (permissible substitution) (INDEPENDENT_AMBULATORY_CARE_PROVIDER_SITE_OTHER): Payer: Medicare Other | Admitting: Cardiothoracic Surgery

## 2013-09-08 ENCOUNTER — Ambulatory Visit (HOSPITAL_BASED_OUTPATIENT_CLINIC_OR_DEPARTMENT_OTHER): Payer: Medicare Other

## 2013-09-08 ENCOUNTER — Encounter (HOSPITAL_COMMUNITY): Payer: Self-pay

## 2013-09-08 ENCOUNTER — Encounter: Payer: Self-pay | Admitting: Cardiothoracic Surgery

## 2013-09-08 VITALS — BP 107/70 | HR 53 | Resp 20 | Ht 72.0 in | Wt 197.0 lb

## 2013-09-08 DIAGNOSIS — I700 Atherosclerosis of aorta: Secondary | ICD-10-CM

## 2013-09-08 DIAGNOSIS — I712 Thoracic aortic aneurysm, without rupture, unspecified: Secondary | ICD-10-CM

## 2013-09-08 DIAGNOSIS — I10 Essential (primary) hypertension: Secondary | ICD-10-CM | POA: Diagnosis not present

## 2013-09-08 DIAGNOSIS — Z01812 Encounter for preprocedural laboratory examination: Secondary | ICD-10-CM | POA: Diagnosis not present

## 2013-09-08 DIAGNOSIS — Z01818 Encounter for other preprocedural examination: Secondary | ICD-10-CM | POA: Diagnosis not present

## 2013-09-08 DIAGNOSIS — Z01810 Encounter for preprocedural cardiovascular examination: Secondary | ICD-10-CM | POA: Diagnosis not present

## 2013-09-08 LAB — CBC WITH DIFFERENTIAL/PLATELET
Basophils Absolute: 0 10*3/uL (ref 0.0–0.1)
Basophils Relative: 1 % (ref 0–1)
EOS ABS: 0.1 10*3/uL (ref 0.0–0.7)
EOS PCT: 2 % (ref 0–5)
HCT: 43 % (ref 39.0–52.0)
Hemoglobin: 14.1 g/dL (ref 13.0–17.0)
Lymphocytes Relative: 19 % (ref 12–46)
Lymphs Abs: 0.9 10*3/uL (ref 0.7–4.0)
MCH: 29 pg (ref 26.0–34.0)
MCHC: 32.8 g/dL (ref 30.0–36.0)
MCV: 88.5 fL (ref 78.0–100.0)
Monocytes Absolute: 0.3 10*3/uL (ref 0.1–1.0)
Monocytes Relative: 5 % (ref 3–12)
NEUTROS PCT: 74 % (ref 43–77)
Neutro Abs: 3.6 10*3/uL (ref 1.7–7.7)
PLATELETS: 91 10*3/uL — AB (ref 150–400)
RBC: 4.86 MIL/uL (ref 4.22–5.81)
RDW: 14.3 % (ref 11.5–15.5)
WBC: 4.8 10*3/uL (ref 4.0–10.5)

## 2013-09-08 LAB — TYPE AND SCREEN
ABO/RH(D): O POS
Antibody Screen: NEGATIVE

## 2013-09-08 LAB — COMPREHENSIVE METABOLIC PANEL
ALT: 37 U/L (ref 0–53)
AST: 29 U/L (ref 0–37)
Albumin: 3.7 g/dL (ref 3.5–5.2)
Alkaline Phosphatase: 129 U/L — ABNORMAL HIGH (ref 39–117)
BILIRUBIN TOTAL: 0.8 mg/dL (ref 0.3–1.2)
BUN: 23 mg/dL (ref 6–23)
CHLORIDE: 104 meq/L (ref 96–112)
CO2: 25 mEq/L (ref 19–32)
CREATININE: 1.84 mg/dL — AB (ref 0.50–1.35)
Calcium: 9.2 mg/dL (ref 8.4–10.5)
GFR, EST AFRICAN AMERICAN: 41 mL/min — AB (ref >90)
GFR, EST NON AFRICAN AMERICAN: 35 mL/min — AB (ref >90)
GLUCOSE: 94 mg/dL (ref 70–99)
Potassium: 5.8 mEq/L — ABNORMAL HIGH (ref 3.7–5.3)
Sodium: 142 mEq/L (ref 137–147)
Total Protein: 6.8 g/dL (ref 6.0–8.3)

## 2013-09-08 LAB — PROTIME-INR
INR: 0.94 (ref 0.00–1.49)
PROTHROMBIN TIME: 12.4 s (ref 11.6–15.2)

## 2013-09-08 LAB — SURGICAL PCR SCREEN
MRSA, PCR: NEGATIVE
Staphylococcus aureus: NEGATIVE

## 2013-09-08 LAB — ABO/RH: ABO/RH(D): O POS

## 2013-09-08 LAB — APTT: aPTT: 32 seconds (ref 24–37)

## 2013-09-08 MED ORDER — CHLORHEXIDINE GLUCONATE 4 % EX LIQD: 60.0000 mL | Freq: Once | CUTANEOUS | Status: DC

## 2013-09-08 NOTE — Progress Notes (Signed)
[name]       [name]             [phone]                    Derek [name] [facility] Medical Record #[id number] Date of Birth: [birth year]  Referring: Larey Dresser, MD Primary Care: Purvis Kilts, MD Renal : Dr Santiago Bur: Supple Chief Complaint:    Chief Complaint  Patient presents with  . Thoracic Aortic Aneurysm    Surgical eval, 2D Echo 08/16/2013, MRA Chest on 08/28/13     History of Present Illness:    Derek [name] 73 y.o. male is seen in the office  today for dilated ascending aorta and mild AI. He has no history of MI, CAD, denies any  anginal symptoms currently The patient has been followed by cardiology since 2006 with serial chest imaging with known dilated ascending aorta. First noted when he had reaction to [facility] and allopurinol in 2006 and ct of chest was done to ro PE. He has a distant history of pulmonary emboli and was treated with coumadin.  At the mid acendinding  aorta measurements  2006   4.1 cm 2010   4.1 cm 2013   4.4 cm 2015   4.5 cm  At the greatest dimension at the sinus of valsalva is 5.0-5.1 cm  And patient referred to cardiac surgery  Past medical history is significant for Crohn's Disease, intermittent bowel obstructions, episodic pancreatitis , gout, stage 3a chronic renal disease.   Current Activity/ Functional Status:  Patient is independent with mobility/ambulation, transfers, ADL's, IADL's.   Zubrod Score: At the time of surgery this patient's most appropriate activity status/level should be described as: [x]     0    Normal activity, no symptoms []     1    Restricted in physical strenuous activity but ambulatory, able to do out light work []     2    Ambulatory and capable of self care, unable to do work activities, up and about               >50 % of waking hours                              []     3    Only limited self care, in bed greater than 50% of waking hours []     4     Completely disabled, no self care, confined to bed or chair []     5    Moribund   Past Medical History  Diagnosis Date  . Hiatal hernia   . Gout     takes Uloric and Colchicine daily  . History of pulmonary embolism 7+yrs ago    both legs and both lungs   . History of gastritis   . Internal hemorrhoids   . History of hepatitis C 1978  . B12 deficiency     takes Vit 12 shot every 14days   . History of small bowel obstruction   . Pancytopenia     Hx of  . Crohn's disease   . RLS (restless legs syndrome)   . Pancreatitis 2010    elevated lipase and amylase, stranding in tail of pancreas, ? from Humira  . Rosacea conjunctivitis(372.31)     takes Minocin daily  . Small bowel obstruction   .  Anxiety   . Thrombocytopenia     hx of  . GERD (gastroesophageal reflux disease)     takes Omeprazole daily  . Post-traumatic stress syndrome     takes Paxil nightly  . PTSD (post-traumatic stress disorder)   . Aorta aneurysm     takes Atenolol daily  . Heart murmur   . Pneumonia     hx of;last time about 81yr ago  . Peripheral neuropathy     takes Gabapentin daily  . Arthritis   . Joint pain   . Joint swelling   . History of blood transfusion   . History of colon polyps   . Hyperoxaluria     Intestinal  . Nephrolithiasis   . CKD (chronic kidney disease) stage 3, GFR 30-59 ml/min 08/22/2011  . Enlarged prostate   . Insomnia     takes Trazodone nightly  . History of MRSA infection 2010  . History of staph infection 1978    Past Surgical History  Procedure Laterality Date  . Hernia repair    . Cholecystectomy    . Hemicolectomy      Right  . Bowel resection    . Knee arthroscopy      Left  . Ligament repair      Left knee  . Ankle surgery      Right  . Foot surgery      Right  . Terminal ileum resection    . Appendectomy    . Colonoscopy    . Esophagogastroduodenoscopy      Family History  Problem Relation Age of Onset  . Kidney disease Father   . Colon  cancer Neg Hx   . Esophageal cancer Neg Hx   . Stomach cancer Neg Hx   . Rectal cancer Neg Hx   No family history of Aortic dissection, sister died age 3473with brain aneurysm, mother died 720with ruptured abdominal aneurysm, father died 736"bad heart" and gout  History   Social History  . Marital Status: Married    Spouse Name: N/A    Number of Children: 2  . Years of Education: N/A   Occupational History  . Retired    Social History Main Topics  . Smoking status: Former SResearch scientist (life sciences) . Smokeless tobacco: Never Used     Comment: quit smoking 38yrago  . Alcohol Use: No  . Drug Use: No  . Sexual Activity: Yes   Other Topics Concern  . Not on file   Social History Narrative   Married   VeLeisure centre manager Daily caffeine   Does not exercise regularly    History  Smoking status  . Former Smoker  Smokeless tobacco  . Never Used    Comment: quit smoking 3033yrgo    History  Alcohol Use No     Allergies  Allergen Reactions  . Lorazepam     REACTION: hallucination    Current Outpatient Prescriptions  Medication Sig Dispense Refill  . aspirin EC 81 MG tablet 1 tablet every other day      . atenolol (TENORMIN) 25 MG tablet Take 25 mg by mouth daily.        . Calcium Carbonate (CALCIUM 500 PO) Take 2 tablets by mouth 2 (two) times daily.       . colchicine 0.6 MG tablet Take 0.6 mg by mouth daily.        . cyanocobalamin (,VITAMIN B-12,) 1000 MCG/ML injection Inject 1,000 mcg into the muscle every  14 (fourteen) days.      . febuxostat (ULORIC) 40 MG tablet Take 80 mg by mouth daily.      . fish oil-omega-3 fatty acids 1000 MG capsule Take 1 capsule (1 g total) by mouth 2 (two) times daily.      Marland Kitchen gabapentin (NEURONTIN) 100 MG capsule Take 100 mg by mouth 2 (two) times daily.       . hydrOXYzine (VISTARIL) 25 MG capsule Take 25-50 mg by mouth 3 (three) times daily as needed for anxiety (May take 2 capsules at bedtime if needed).      Marland Kitchen loteprednol (LOTEMAX) 0.5 %  ophthalmic suspension Place 1 drop into both eyes 2 (two) times daily.      . Magnesium Oxide 420 MG TABS Take 1 tablet by mouth 2 (two) times daily.       . minocycline (MINOCIN,DYNACIN) 100 MG capsule Take 100 mg by mouth daily.      . Multiple Vitamin (MULTIVITAMIN) tablet Take 1 tablet by mouth daily.        Marland Kitchen omeprazole (PRILOSEC) 20 MG capsule Take 20 mg by mouth daily.        Marland Kitchen PARoxetine (PAXIL) 40 MG tablet Take 40 mg by mouth at bedtime.        . potassium citrate (UROCIT-K) 10 MEQ (1080 MG) SR tablet Take 10 mEq by mouth 2 (two) times daily.      Marland Kitchen terazosin (HYTRIN) 2 MG capsule Take 2 mg by mouth at bedtime.       . traZODone (DESYREL) 150 MG tablet Take 150 mg by mouth at bedtime.         No current facility-administered medications for this visit.     Review of Systems:     Cardiac Review of Systems: Y or N  Chest Pain [  n  ]  Resting SOB [  n ] Exertional SOB  [n  ]  Orthopnea [n  ]   Pedal Edema [ n  ]    Palpitations [ n ] Syncope  [ n ]   Presyncope [ n  ]  General Review of Systems: [Y] = yes [  ]=no Constitional: recent weight change [ n ];  Wt loss over the last 3 months [ n  ] anorexia [  ]; fatigue [n  ]; nausea [ n ]; night sweats [n  ]; fever [ n ]; or chills [  n];          Dental: poor dentition[  ]; Last Dentist visit:   Eye : blurred vision [  ]; diplopia [   ]; vision changes [  ];  Amaurosis fugax[  ]; Resp: cough [n  ];  wheezing[  ];  hemoptysis[  ]; shortness of breath[  ]; paroxysmal nocturnal dyspnea[  ]; dyspnea on exertion[  ]; or orthopnea[  ];  GI:  gallstones[  ], vomiting[  ];  dysphagia[  ]; melena[  ];  hematochezia [  ]; heartburn[  ];   Hx of  Colonoscopy[ yes ]; GU: kidney stones [  ]; hematuria[  ];   dysuria [  ];  nocturia[  ];  history of     obstruction [  ]; urinary frequency [  ]             Skin: rash, swelling[  ];, hair loss[  ];  peripheral edema[  ];  or itching[  ]; Musculosketetal: myalgias[  ];  joint swelling[  ];  joint  erythema[  ];  joint pain[  ];  back pain[  ];  Heme/Lymph: bruising[  ];  bleeding[  ];  anemia[  ];  Neuro: TIA[  ];  headaches[  ];  stroke[  ];  vertigo[  ];  seizures[  ];   paresthesias[  ];  difficulty walking[  ];  Psych:depression[y  ]; anxiety[ y ];hx PTSD service connected  [place] [phone]  Endocrine: diabetes[  ];  thyroid dysfunction[  ];  Immunizations: Flu up to date [place]  ]; Pneumococcal up to date [  n];  Other:  Physical Exam: BP 107/70  Pulse 53  Resp 20  Ht 6' (1.829 m)  Wt 197 lb (89.359 kg)  BMI 26.71 kg/m2  SpO2 97%  PHYSICAL EXAMINATION:  General appearance: alert, cooperative, appears stated age and no distress Neurologic: intact Heart: regular rate and rhythm, S1, S2 normal, no murmur, click, rub or gallop Lungs: clear to auscultation bilaterally Abdomen: soft, non-tender; bowel sounds normal; no masses,  no organomegaly and healed midline scar from bowel resection  Extremities: extremities normal, atraumatic, no cyanosis or edema, Homans sign is negative, no sign of DVT and no edema, redness or tenderness in the calves or thighs Wound: well healed No carotid bruits, full radial femoral pedal pulses , 2+ dp and pt bilaterial  Diagnostic Studies & Laboratory data:     Recent Radiology Findings:   Mr Angiogram Chest W Wo Contrast  08/28/2013   CLINICAL DATA:  Aortic aneurysm  EXAM: MRA CHEST WITH OR WITHOUT CONTRAST  TECHNIQUE: Angiographic images of the chest were obtained using MRA technique without and with intravenous contrast. Gated sagittal oblique images through the aortic arch were obtained.  CONTRAST:  24m MULTIHANCE GADOBENATE DIMEGLUMINE 529 MG/ML IV SOLN  COMPARISON:  09/05/2012 and earlier studies  FINDINGS: The thoracic aorta is dilated to a diameter of 5.1 cm at the level the sinuses of Valsalva (stable by my measurement), 4.5 cm at the sino-tubular junction, 4.2 cm mid ascending, 3.6 cm proximal arch, 3.2 cm distal arch, 3.0 cm proximal  descending, 2.6 cm distal descending above the diaphragm. No evidence of dissection or stenosis. Classic 3 vessel brachiocephalic arterial origin anatomy without proximal stenosis. No significant atheromatous irregularity.  No pleural or pericardial effusion. No hilar or mediastinal adenopathy. Visualized portions of upper abdomen and proximal abdominal aorta unremarkable. Usual endplate spurring at multiple contiguous levels in the mid thoracic spine.  IMPRESSION: 1. Stable ascending aortic aneurysm without complicating features.   Electronically Signed   By: DArne ClevelandM.D.   On: 08/28/2013 12:29      Recent Lab Findings: Lab Results  Component Value Date   WBC 4.8 09/08/2013   HGB 14.1 09/08/2013   HCT 43.0 09/08/2013   PLT 91* 09/08/2013   GLUCOSE 94 09/08/2013   CHOL 150 09/30/2012   TRIG 329.0* 09/30/2012   HDL 45.20 09/30/2012   LDLDIRECT 60.7 09/30/2012   ALT 37 09/08/2013   AST 29 09/08/2013   NA 142 09/08/2013   K 5.8* 09/08/2013   CL 104 09/08/2013   CREATININE 1.84* 09/08/2013   BUN 23 09/08/2013   CO2 25 09/08/2013   INR 0.94 09/08/2013   ECHO: Patient: HFaye, SanfilippoMR #: [id number] Date: 08/16/2013 Gender: M Age: [age]Height: 182.9cm Weight: 88.5kg BSA: 2.131m Pt. Status: Room:  SONOGRAPHER JoCharlann NossRDCS ATTENDING McLoralie ChampagneRHalina AndreasDalton REAlexandria LodgeDalton PERFORMING [facility], Outpatient cc:  ------------------------------------------------------------ LV EF: 45% - 50%  ------------------------------------------------------------  Indications: 424.1 Aortic valve disorders. Aneurysm - thoracic 441.2.  ------------------------------------------------------------ History: PMH: Acquired from the patient and from the patient's chart. Aortic valve disease. Chronickidney failure. Risk factors: Hypertension.  ------------------------------------------------------------ Study Conclusions  - Left ventricle: The cavity size was normal. Wall  thickness was normal. Systolic function was mildly reduced. The estimated ejection fraction was in the range of 45% to 50%. Diffuse hypokinesis. Doppler parameters are consistent with abnormal left ventricular relaxation (grade 1 diastolic dysfunction). - Aortic valve: Trileaflet. There was no stenosis. Mild regurgitation. - Aorta: Dilated aortic root and ascending aorta. Ascending aorta dimension:50 mm. Aortic root dimension: 34m (ED). - Mitral valve: No significant regurgitation. - Left atrium: The atrium was mildly dilated. - Right ventricle: The cavity size was normal. Systolic function was normal. - Pulmonary arteries: No complete TR doppler jet so unable to estimate PA systolic pressure. - Inferior vena cava: The vessel was normal in size; the respirophasic diameter changes were in the normal range (= 50%); findings are consistent with normal central venous pressure. Impressions:  - Normal LV size with mild diffuse hypokinesis, EF 45-50%. Normal RV size and systolic function. Trileaflet aortic valve with mild aortic insufficiency. Dilated ascending aorta to 5 cm, dilated aortic root at sinuses of valsalva to 4.7 cm. Transthoracic echocardiography. M-mode, complete 2D, spectral Doppler, and color Doppler. Height: Height: 182.9cm. Height: 72in. Weight: Weight: 88.5kg. Weight: 194.6lb. Body mass index: BMI: 26.4kg/m^2. Body surface area: BSA: 2.116m. Blood pressure: 114/80. Patient status: Outpatient. Location: [place]  ------------------------------------------------------------  ------------------------------------------------------------ Left ventricle: The cavity size was normal. Wall thickness was normal. Systolic function was mildly reduced. The estimated ejection fraction was in the range of 45% to 50%. Diffuse hypokinesis. Doppler parameters are consistent with abnormal left ventricular relaxation (grade 1  diastolic dysfunction).  ------------------------------------------------------------ Aortic valve: Trileaflet. Doppler: There was no stenosis. Mild regurgitation.  ------------------------------------------------------------ Aorta: Dilated aortic root and ascending aorta. Ascending aorta dimension:50 mm.  ------------------------------------------------------------ Mitral valve: Normal thickness leaflets . Doppler: There was no evidence for stenosis. No significant regurgitation.  ------------------------------------------------------------ Left atrium: The atrium was mildly dilated.  ------------------------------------------------------------ Right ventricle: The cavity size was normal. Systolic function was normal.  ------------------------------------------------------------ Pulmonic valve: Structurally normal valve. Cusp separation was normal. Doppler: Transvalvular velocity was within the normal range. No regurgitation.  ------------------------------------------------------------ Tricuspid valve: Doppler: No significant regurgitation.  ------------------------------------------------------------ Pulmonary artery: No complete TR doppler jet so unable to estimate PA systolic pressure.  ------------------------------------------------------------ Right atrium: The atrium was normal in size.  ------------------------------------------------------------ Pericardium: There was no pericardial effusion.  ------------------------------------------------------------ Systemic veins: Inferior vena cava: The vessel was normal in size; the respirophasic diameter changes were in the normal range (= 50%); findings are consistent with normal central venous pressure.  ------------------------------------------------------------ Post procedure conclusions Ascending Aorta:  - Dilated aortic root and ascending aorta. Ascending aorta dimension:50  mm.  ------------------------------------------------------------  2D measurements Normal Doppler measurements Norma Left ventricle l LVID ED, 50.5 mm 43-52 Left ventricle chord, Ea, lat 8.6 cm/s ----- PLAX ann, tiss 6 LVID ES, 32.1 mm 23-38 DP chord, E/Ea, lat 6.1 ----- PLAX ann, tiss FS, chord, 36 % >29 DP PLAX Ea, med 4.1 cm/s ----- LVPW, ED 11.7 mm ------ ann, tiss 7 IVS/LVPW 0.76 <1.3 DP ratio, ED E/Ea, med 12. ----- Ventricular septum ann, tiss 66 IVS, ED 8.94 mm ------ DP LVOT LVOT Diam, S 25 mm ------ Peak vel, 76. cm/s ----- Area 4.91 cm^2 ------ S 7 Diam 25 mm ------ VTI, S 20. cm ----- Aorta 5  Root diam, 46 mm ------ HR 51 bpm ----- ED Stroke 100 ml ----- Left atrium vol .6 AP dim 40 mm ------ Cardiac 5.1 L/min ----- AP dim 1.9 cm/m^2 <2.2 output index Cardiac 2.4 L/(min-m ----- index ^2) Stroke 47. ml/m^2 ----- index 7 Mitral valve Peak E 52. cm/s ----- vel 8 Peak A 51. cm/s ----- vel 8 Decelerat 324 ms 150-2 ion time 30 Peak E/A 1 ----- ratio Systemic veins Estimated 5 mm Hg ----- CVP Right ventricle Sa vel, 10. cm/s ----- lat ann, 1 tiss DP  ------------------------------------------------------------ Prepared and Electronically Authenticated by  [name] 2015-01-14T11:39:07.380   Assessment / Plan:   Dilated Aortic root ascending aorta with out history of Bicuspid aortic vale or Marfan's or associated genetic disease, no family history of Aortic dissection or sudden unexplained death at young age.(sister with brain aneurysm and mother ruptured AAA). There has been very slow enlargement of aortic root over the past 9 years that patient has been followed. Patient should continue on beta blocker, BP control (which appears good ) good dental care (decrease risk of endocarditis). Consider elective root replacement at 5.5 cm. The dx and radiographic findings have been discussed with the patient and wife in detail.   Aortic Size Index=      5.0    /Body surface area is 2.13 meters squared. =2.34  < 2.75 cm/m2      4% risk per year 2.75 to 4.25          8% risk per year > 4.25 cm/m2    20% risk per year  cross sectional area of aorta cm2/height in meters > 10.7 I will plan to see patient back in 6 months with follow up MRA of chest ( instead of CT due to Stage IIIa CKD) Portions of this note have been generated with the assistance of voice recognition software. I apologize for any typographical/grammatical errors that may be present in this note as it has not been reviewed by a professional transcriptionist    Grace Isaac MD      [address] [place] [address] Office [phone]   Beeper [phone]

## 2013-09-08 NOTE — Patient Instructions (Signed)
Thoracic Aortic Aneurysm An aneurysm is a bulge in an artery. It happens when the wall of the artery is weakened or damaged. If the aneurysm gets too big, it bursts (ruptures) and severe bleeding occurs. A thoracic aortic aneurysm is an aneurysm that occurs in the first part of the aorta, between the heart and the diaphragm. The aorta is the main artery and supplies blood from the heart to the rest of the body. A thoracic aortic aneurysm can enlarge and rupture or blood can flow between the layers of the wall of the aorta through a tear (aorticdissection). Both of these conditions can cause bleeding inside the body and can be life threatening unless diagnosed and treated promptly. CAUSES  The exact cause of a thoracic aortic aneurysm is often unknown. Some contributing factors are:   A hardening of the arteries caused by the buildup of fat and other substances in the lining of a blood vessel (arteriosclerosis).  Inflammation of the walls of an artery (arteritis).  Connective tissue diseases, such as Marfan syndrome.  Injury or trauma to the aorta.  An infection, such as syphilis or staphylococcus, in the wall of the aorta (infectious aortitis) caused by bacteria. RISK FACTORS  Risk factors that contribute to a thoracic aortic aneurysm may include:  Age older than 72 years.  High blood pressure (hypertension).  Male gender.  Ethnicity (white race).  Obesity.  Family history of aneurysm (first degree relatives only).  Tobacco use. PREVENTION  The following healthy lifestyle habits may help decrease your risk of a thoracic aortic aneurysm:  Quitting smoking. Smoking can raise your blood pressure and cause arteriosclerosis.  Limiting or avoiding alcohol.  Keeping your blood pressure, blood sugar level, and cholesterol levels within normal limits.  Decreasing your salt intake. In some people, too much salt can raise blood pressure and increase your risk of abdominal aortic  aneurysm.  Eating a diet low in saturated fats and cholesterol.  Increasing your fiber intake by including whole grains, vegetables, and fruits in your diet. Eating these foods may help lower blood pressure.  Maintaining a healthy weight.  Staying physically active and exercising regularly. SYMPTOMS  The symptoms of thoracic aortic aneurysm may vary depending on the size and rate of growth of the aneurysm. Most grow slowly and do not have any symptoms. When symptoms do occur, they may include:  Pain (chest, back, sides, or abdomen). The pain may vary in intensity. A sudden onset of severe pain may indicate that the aneurysm has ruptured.  Hoarseness.  Cough.  Shortness of breath.  Swallowing problems.  Nausea or vomiting or both. DIAGNOSIS  Since most unruptured thoracic aortic aneurysms have no symptoms, they are often discovered during diagnostic exams for other conditions. An aneurysm may be found during the following procedures:  Ultrasonography (a one-time screening for thoracic aortic aneurysm by ultrasonography is also recommended for all men aged 30 75 years who have ever smoked).  X-ray exams.  A CT scan.  An MRI.  Angiography or arteriography. TREATMENT  Treatment of a thoracic aortic aneurysm depends on the size of your aneurysm, your age, and risk factors for rupture. Medicine to control blood pressure and pain may be used to manage aneurysms smaller than 2.3 in (6 cm). Regular monitoring for enlargement may be recommended by your health care provider if:  The aneurysm is 1.2 1.5 in (3 4 cm) in size (an annual ultrasonography may be recommended).  The aneurysm is 1.5 1.8 in (4 4.5 cm) in  size (an ultrasonography every 6 months may be recommended).  The aneurysm is larger than 1.8 in (4.5 cm) in size (your health care provider may ask that you be examined by a vascular surgeon). If your aneurysm is larger than 2.2 in (5.5 cm) or if it is enlarging quickly,  surgical repair may be recommended. There are two main methods for repair of an aneurysm:   Endovascular repair (a minimally invasive surgery).  Open repair. This method is used if an endovascular repair is not possible. Document Released: 07/20/2005 Document Revised: 05/10/2013 Document Reviewed: 01/30/2013 [facility] Patient Information 2014 [place].

## 2013-09-08 NOTE — Progress Notes (Signed)
Dr.McLean is cardiologist with last visit a couple of days ago with Labeuer  Multiple echo reports in epic with last one in 2015  Stress test done about 65yr ago but getting ready to have another one on 09/11/13  Denies ever having a heart cath  Medical Md is Dr.Golding   CXR report in epic from 11-25-12  EKG done on 09/06/13 with MAundra Dubin

## 2013-09-08 NOTE — Pre-Procedure Instructions (Signed)
Derek [name]  09/08/2013   Your procedure is scheduled on:  Thurs, Feb 19 @ 7:30 AM  Report to [facility] Entrance A  at 5:30 AM.  Call this number if you have problems the morning of surgery: [phone]   Remember:   Do not eat food or drink liquids after midnight.   Take these medicines the morning of surgery with A SIP OF WATER: Atenolol(Tenormin),Colchicine,Uloric(Febuxostat),Gabapentin(Neurontin),Eye Drops,Prilosec(Omeprazole),and Minocin(Minocycline)               Stop taking your Aspirin and Fish Oil. No Goody's,BC's,Aleve,Ibuprofen,or any Herbal Medications   Do not wear jewelry  Do not wear lotions, powders, or colognes. You may wear deodorant.  Men may shave face and neck.  Do not bring valuables to the hospital.  [facility] is not responsible                  for any belongings or valuables.               Contacts, dentures or bridgework may not be worn into surgery.  Leave suitcase in the car. After surgery it may be brought to your room.  For patients admitted to the hospital, discharge time is determined by your                treatment team.                Special Instructions:  [facility] - Preparing for Surgery  Before surgery, you can play an important role.  Because skin is not sterile, your skin needs to be as free of germs as possible.  You can reduce the number of germs on you skin by washing with CHG (chlorahexidine gluconate) soap before surgery.  CHG is an antiseptic cleaner which kills germs and bonds with the skin to continue killing germs even after washing.  Please DO NOT use if you have an allergy to CHG or antibacterial soaps.  If your skin becomes reddened/irritated stop using the CHG and inform your nurse when you arrive at Short Stay.  Do not shave (including legs and underarms) for at least 48 hours prior to the first CHG shower.  You may shave your face.  Please follow these instructions carefully:   1.  Shower with CHG Soap  the night before surgery and the                                morning of Surgery.  2.  If you choose to wash your hair, wash your hair first as usual with your       normal shampoo.  3.  After you shampoo, rinse your hair and body thoroughly to remove the                      Shampoo.  4.  Use CHG as you would any other liquid soap.  You can apply chg directly       to the skin and wash gently with scrungie or a clean washcloth.  5.  Apply the CHG Soap to your body ONLY FROM THE NECK DOWN.        Do not use on open wounds or open sores.  Avoid contact with your eyes,       ears, mouth and genitals (private parts).  Wash genitals (private parts)       with your normal  soap.  6.  Wash thoroughly, paying special attention to the area where your surgery        will be performed.  7.  Thoroughly rinse your body with warm water from the neck down.  8.  DO NOT shower/wash with your normal soap after using and rinsing off       the CHG Soap.  9.  Pat yourself dry with a clean towel.            10.  Wear clean pajamas.            11.  Place clean sheets on your bed the night of your first shower and do not        sleep with pets.  Day of Surgery  Do not apply any lotions/deoderants the morning of surgery.  Please wear clean clothes to the hospital/surgery center.     Please read over the following fact sheets that you were given: Pain Booklet, Coughing and Deep Breathing, Blood Transfusion Information and Surgical Site Infection Prevention

## 2013-09-08 NOTE — Progress Notes (Signed)
Not seeing the EKG in epic-called Dr.McLean to find out if it has been read

## 2013-09-11 ENCOUNTER — Ambulatory Visit (HOSPITAL_BASED_OUTPATIENT_CLINIC_OR_DEPARTMENT_OTHER): Payer: Medicare Other | Admitting: Radiology

## 2013-09-11 ENCOUNTER — Encounter (HOSPITAL_COMMUNITY)
Admission: RE | Admit: 2013-09-11 | Discharge: 2013-09-11 | Disposition: A | Discharge status: Home / Self Care | Payer: Medicare Other | Source: Ambulatory Visit | Attending: Orthopedic Surgery | Admitting: Orthopedic Surgery

## 2013-09-11 VITALS — BP 101/72 | HR 48 | Ht 72.0 in | Wt 205.0 lb

## 2013-09-11 DIAGNOSIS — I712 Thoracic aortic aneurysm, without rupture, unspecified: Secondary | ICD-10-CM

## 2013-09-11 DIAGNOSIS — R0609 Other forms of dyspnea: Secondary | ICD-10-CM | POA: Diagnosis not present

## 2013-09-11 DIAGNOSIS — R079 Chest pain, unspecified: Secondary | ICD-10-CM

## 2013-09-11 DIAGNOSIS — I359 Nonrheumatic aortic valve disorder, unspecified: Secondary | ICD-10-CM

## 2013-09-11 DIAGNOSIS — I2510 Atherosclerotic heart disease of native coronary artery without angina pectoris: Secondary | ICD-10-CM

## 2013-09-11 DIAGNOSIS — Z8249 Family history of ischemic heart disease and other diseases of the circulatory system: Secondary | ICD-10-CM

## 2013-09-11 DIAGNOSIS — R109 Unspecified abdominal pain: Secondary | ICD-10-CM

## 2013-09-11 DIAGNOSIS — R0602 Shortness of breath: Secondary | ICD-10-CM

## 2013-09-11 DIAGNOSIS — I4949 Other premature depolarization: Secondary | ICD-10-CM | POA: Diagnosis not present

## 2013-09-11 DIAGNOSIS — Z01818 Encounter for other preprocedural examination: Secondary | ICD-10-CM | POA: Insufficient documentation

## 2013-09-11 DIAGNOSIS — R0989 Other specified symptoms and signs involving the circulatory and respiratory systems: Secondary | ICD-10-CM | POA: Diagnosis not present

## 2013-09-11 DIAGNOSIS — I10 Essential (primary) hypertension: Secondary | ICD-10-CM | POA: Insufficient documentation

## 2013-09-11 DIAGNOSIS — Z01812 Encounter for preprocedural laboratory examination: Secondary | ICD-10-CM | POA: Insufficient documentation

## 2013-09-11 DIAGNOSIS — Z01810 Encounter for preprocedural cardiovascular examination: Secondary | ICD-10-CM | POA: Insufficient documentation

## 2013-09-11 HISTORY — DX: Insomnia, unspecified: G47.00

## 2013-09-11 HISTORY — DX: Personal history of Methicillin resistant Staphylococcus aureus infection: Z86.14

## 2013-09-11 HISTORY — DX: Unspecified osteoarthritis, unspecified site: M19.90

## 2013-09-11 HISTORY — DX: Personal history of colonic polyps: Z86.010

## 2013-09-11 HISTORY — DX: Benign prostatic hyperplasia without lower urinary tract symptoms: N40.0

## 2013-09-11 HISTORY — DX: Polyneuropathy, unspecified: G62.9

## 2013-09-11 HISTORY — DX: Personal history of other infectious and parasitic diseases: Z86.19

## 2013-09-11 HISTORY — DX: Personal history of colon polyps, unspecified: Z86.0100

## 2013-09-11 HISTORY — DX: Personal history of other medical treatment: Z92.89

## 2013-09-11 HISTORY — DX: Cardiac murmur, unspecified: R01.1

## 2013-09-11 HISTORY — DX: Pneumonia, unspecified organism: J18.9

## 2013-09-11 MED ORDER — TECHNETIUM TC 99M SESTAMIBI GENERIC - CARDIOLITE
33.0000 | Freq: Once | INTRAVENOUS | Status: AC | PRN
  Administered 2013-09-11: 33 via INTRAVENOUS

## 2013-09-11 MED ORDER — REGADENOSON 0.4 MG/5ML IV SOLN
0.4000 mg | Freq: Once | INTRAVENOUS | Status: AC
  Administered 2013-09-11: 0.4 mg via INTRAVENOUS

## 2013-09-11 MED ORDER — TECHNETIUM TC 99M SESTAMIBI GENERIC - CARDIOLITE
10.8000 | Freq: Once | INTRAVENOUS | Status: AC | PRN
  Administered 2013-09-11: 11 via INTRAVENOUS

## 2013-09-11 NOTE — Progress Notes (Addendum)
Anesthesia Chart Review:  Patient is a 73 year old male scheduled for left total shoulder on 09/21/13 by Dr. Onnie Graham.   History includes former smoker, murmur, ascending aortic aneurysm, hiatal hernia, DVT/PE '06, hepatitis C ("HCV antibody positive but viral RNA negative by PCR"), thrombocytopenia/pancytopenia, pancreatitis '10, Crohn's disease, anxiety, PTSD, GERD, SBO with history of colon resection, peripheral neuropathy, CKD stage III (Dr. Jimmy Footman), BPH, MRSA '10, RLS, nephrolithiasis.  Cardiologist is Dr. Aundra Dubin.  He recommended preoperative nuclear stress test due to decease in EF on Aug 24, 2013 echo and CT referral due to ascending aortic aneurysm.  Nuclear stress test is today.  He saw Dr. Servando Snare 09/08/13 who did not recommend repair until aneurysm is 5.5 cm.   EKG on 09/06/13 showed SB at 50 bpm.  Echo on Aug 24, 2013 showed: Normal LV size with mild diffuse hypokinesis, EF 45-50%. Normal RV size and systolic function. Trileaflet aortic valve with mild aortic insufficiency. Dilated ascending aorta to 5 cm, dilated aortic root at sinuses of valsalva to 4.7 cm.  He had an MRA of the chest on 08/28/13 which showed: The thoracic aorta is dilated to a diameter of 5.1 cm at the level the sinuses of Valsalva (stable by my measurement), 4.5 cm at the sino-tubular junction, 4.2 cm mid ascending, 3.6 cm proximal arch, 3.2 cm distal arch, 3.0 cm proximal descending, 2.6 cm distal descending above the diaphragm. No evidence of dissection or stenosis. Classic 3 vessel brachiocephalic arterial origin anatomy without proximal stenosis. No significant atheromatous irregularity. It was felt stable since earlier study on 09/05/12. Patient was subsequently referred to CT surgeon Dr. Servando Snare and seen on 09/08/13. He is not recommending elective repair unless aneurysm is 5.5 cm or larger.  He will see patient back in six months with follow-up MRA of the chest (instead of CT due to CKD). (There was no evidence of AAA or iliac  artery aneurysm by abdominal ultrasound on 09/08/13.)  CXR on 11/25/12 showed no acute cardiopulmonary abnormality seen.  Preoperative labs noted.  K 5.8, Cr 1.84 (stable).  AST/ALT WNL. PLT count 91K (previously 74-115K since 08/2011). PT/PTT WNL. T&S done. ISTAT on arrival to re-evaluate for hyperkalemia.    I'll follow-up stress test results once available.  George Hugh [facility] Short Stay Center/Anesthesiology Phone [phone] 09/11/2013 4:47 PM  Addendum: 09/12/2013 1:55 PM Nuclear stress test on 09/11/13 showed: Overall Impression: Normal stress nuclear study. Normal apical thinning seen at rest and with stress. LV Ejection Fraction: 50%. LV Wall Motion: NL LV Function; NL Wall Motion.

## 2013-09-11 NOTE — Progress Notes (Addendum)
  [facility] [address] [place], [place] [address] [phone]    Cardiology Nuclear Med Study  Derek [name] is a 73 y.o. male     MRN : [id number]     DOB: 12-10-1940  Procedure Date: 09/11/2013  Nuclear Med Background Indication for Stress Test:  Evaluation for Ischemia and Surgical Clearance:09/21/13 Pre op clearance for Left total shoulder replacement with Dr. Onnie Graham and Recent ECHO with decreased EF History:  No known CAD, Echo 2015 EF 45-50%, MPI 2011 (ischemia, scar), Hx PE  Cardiac Risk Factors: History of Smoking, Hypertension and PVD  Symptoms:  DOE and Palpitations   Nuclear Pre-Procedure Caffeine/Decaff Intake:  8:00pm NPO After: 8:00pm   Lungs:  clear O2 Sat: 97% on room air. IV 0.9% NS with Angio Cath:  20g  IV Site: L Hand  IV Started by:  Derek [name], CNMT  Chest Size (in):  44 Cup Size: n/a  Height: 6' (1.829 m)  Weight:  205 lb (92.987 kg)  BMI:  Body mass index is 27.8 kg/(m^2). Tech Comments:  No Tenormin x 12 hrs.    Nuclear Med Study 1 or 2 day study: 1 day  Stress Test Type:  Lexiscan  Reading MD: n/a  Order Authorizing Provider:  Theador Hawthorne  Resting Radionuclide: Technetium 40mSestamibi  Resting Radionuclide Dose: 11.0 mCi   Stress Radionuclide:  Technetium 985mestamibi  Stress Radionuclide Dose: 33.0 mCi           Stress Protocol Rest HR: 48 Stress HR: 65  Rest BP: 101/72 Stress BP: 96/66  Exercise Time (min): n/a METS: n/a           Dose of Adenosine (mg):  n/a Dose of Lexiscan: 0.4 mg  Dose of Atropine (mg): n/a Dose of Dobutamine: n/a mcg/kg/min (at max HR)  Stress Test Technologist: Derek [name]  Nuclear Technologist:  Derek [name]     Rest Procedure:  Myocardial perfusion imaging was performed at rest 45 minutes following the intravenous administration of Technetium 9937mstamibi. Rest ECG: NSR - Normal EKG  Stress Procedure:  The patient received IV Lexiscan 0.4 mg  over 15-seconds.  Technetium 34m68mtamibi injected at 30-seconds.  Quantitative spect images were obtained after a 45 minute delay.  Patient reported symptoms of feeling weird and SOB.  Stress ECG: No significant change from baseline ECG  QPS Raw Data Images:  Normal; no motion artifact; normal heart/lung ratio. Stress Images:  Normal homogeneous uptake in all areas of the myocardium. Rest Images:  Normal homogeneous uptake in all areas of the myocardium. Subtraction (SDS):  No evidence of ischemia. Transient Ischemic Dilatation (Normal <1.22):  1.00 Lung/Heart Ratio (Normal <0.45):  0.38  Quantitative Gated Spect Images QGS EDV:  125 ml QGS ESV:  63 ml  Impression Exercise Capacity:  Lexiscan with no exercise. BP Response:  Hypotensive blood pressure response. Clinical Symptoms:  There is dyspnea. ECG Impression:  No significant ST segment change suggestive of ischemia. Comparison with Prior Nuclear Study: No images to compare  Overall Impression:  Normal stress nuclear study. Normal apical thinning seen at rest and with stress.  LV Ejection Fraction: 50%.  LV Wall Motion:  NL LV Function; NL Wall Motion  Derek [name]  EF 50%, no ischemia or infarction.  OK for shoulder surgery.    DaltLoralie Champagne0/2015

## 2013-09-12 ENCOUNTER — Encounter: Payer: Self-pay | Admitting: Cardiology

## 2013-09-12 ENCOUNTER — Telehealth: Payer: Self-pay

## 2013-09-12 NOTE — Progress Notes (Signed)
Pt notified OK for surgery

## 2013-09-12 NOTE — Telephone Encounter (Signed)
Dr Deterding requesting that no IVP dyes be used with the upcoming MRA Chest scheduled for 6 months eval. Due to Derek [name] CKD.  Will send information to Dr Servando Snare.

## 2013-09-13 ENCOUNTER — Telehealth: Payer: Self-pay | Admitting: Cardiology

## 2013-09-13 NOTE — Telephone Encounter (Signed)
Received request from Nurse fax box, documents faxed for surgical clearance. To: [place] Fax number: [phone] Attention: 2.11.15/kdm

## 2013-09-19 ENCOUNTER — Encounter: Payer: Self-pay | Admitting: Cardiology

## 2013-09-20 MED ORDER — CEFAZOLIN SODIUM-DEXTROSE 2-3 GM-% IV SOLR
2.0000 g | INTRAVENOUS | Status: AC
  Administered 2013-09-21: 2 g via INTRAVENOUS
  Filled 2013-09-20: qty 50

## 2013-09-21 ENCOUNTER — Inpatient Hospital Stay (HOSPITAL_COMMUNITY)
Admission: RE | Admit: 2013-09-21 | Discharge: 2013-09-22 | DRG: 483 | Disposition: A | Discharge status: Home / Self Care | Payer: Medicare Other | Source: Ambulatory Visit | Attending: Orthopedic Surgery | Admitting: Orthopedic Surgery

## 2013-09-21 ENCOUNTER — Encounter (HOSPITAL_COMMUNITY)
Admission: RE | Disposition: A | Discharge status: Home / Self Care | Payer: Self-pay | Source: Ambulatory Visit | Attending: Orthopedic Surgery

## 2013-09-21 ENCOUNTER — Encounter (HOSPITAL_COMMUNITY): Payer: Medicare Other | Admitting: Vascular Surgery

## 2013-09-21 ENCOUNTER — Encounter (HOSPITAL_COMMUNITY): Payer: Self-pay | Admitting: *Deleted

## 2013-09-21 ENCOUNTER — Inpatient Hospital Stay (HOSPITAL_COMMUNITY): Payer: Medicare Other | Admitting: Anesthesiology

## 2013-09-21 DIAGNOSIS — G4700 Insomnia, unspecified: Secondary | ICD-10-CM | POA: Diagnosis present

## 2013-09-21 DIAGNOSIS — N183 Chronic kidney disease, stage 3 unspecified: Secondary | ICD-10-CM | POA: Diagnosis not present

## 2013-09-21 DIAGNOSIS — F4310 Post-traumatic stress disorder, unspecified: Secondary | ICD-10-CM | POA: Diagnosis present

## 2013-09-21 DIAGNOSIS — M19019 Primary osteoarthritis, unspecified shoulder: Secondary | ICD-10-CM | POA: Diagnosis not present

## 2013-09-21 DIAGNOSIS — Z7982 Long term (current) use of aspirin: Secondary | ICD-10-CM | POA: Diagnosis not present

## 2013-09-21 DIAGNOSIS — Z8614 Personal history of Methicillin resistant Staphylococcus aureus infection: Secondary | ICD-10-CM

## 2013-09-21 DIAGNOSIS — Z86711 Personal history of pulmonary embolism: Secondary | ICD-10-CM

## 2013-09-21 DIAGNOSIS — Z79899 Other long term (current) drug therapy: Secondary | ICD-10-CM

## 2013-09-21 DIAGNOSIS — M109 Gout, unspecified: Secondary | ICD-10-CM | POA: Diagnosis not present

## 2013-09-21 DIAGNOSIS — K219 Gastro-esophageal reflux disease without esophagitis: Secondary | ICD-10-CM | POA: Diagnosis present

## 2013-09-21 DIAGNOSIS — Z96619 Presence of unspecified artificial shoulder joint: Secondary | ICD-10-CM

## 2013-09-21 DIAGNOSIS — I714 Abdominal aortic aneurysm, without rupture, unspecified: Secondary | ICD-10-CM | POA: Diagnosis present

## 2013-09-21 DIAGNOSIS — K5090 Crohn's disease, unspecified, without complications: Secondary | ICD-10-CM | POA: Diagnosis not present

## 2013-09-21 DIAGNOSIS — Z87891 Personal history of nicotine dependence: Secondary | ICD-10-CM

## 2013-09-21 DIAGNOSIS — G609 Hereditary and idiopathic neuropathy, unspecified: Secondary | ICD-10-CM | POA: Diagnosis present

## 2013-09-21 DIAGNOSIS — G8918 Other acute postprocedural pain: Secondary | ICD-10-CM | POA: Diagnosis not present

## 2013-09-21 DIAGNOSIS — E538 Deficiency of other specified B group vitamins: Secondary | ICD-10-CM | POA: Diagnosis not present

## 2013-09-21 HISTORY — DX: Unspecified viral hepatitis C without hepatic coma: B19.20

## 2013-09-21 HISTORY — PX: TOTAL SHOULDER ARTHROPLASTY: SHX126

## 2013-09-21 HISTORY — DX: Anemia, unspecified: D64.9

## 2013-09-21 HISTORY — DX: Unspecified malignant neoplasm of skin, unspecified: C44.90

## 2013-09-21 LAB — POCT I-STAT 4, (NA,K, GLUC, HGB,HCT)
Glucose, Bld: 96 mg/dL (ref 70–99)
HEMATOCRIT: 41 % (ref 39.0–52.0)
Hemoglobin: 13.9 g/dL (ref 13.0–17.0)
Potassium: 5 mEq/L (ref 3.7–5.3)
Sodium: 141 mEq/L (ref 137–147)

## 2013-09-21 SURGERY — ARTHROPLASTY, SHOULDER, TOTAL
Anesthesia: General | Site: Shoulder | Laterality: Left

## 2013-09-21 MED ORDER — GLYCOPYRROLATE 0.2 MG/ML IJ SOLN
INTRAMUSCULAR | Status: AC
  Filled 2013-09-21: qty 1

## 2013-09-21 MED ORDER — HYDROMORPHONE HCL PF 1 MG/ML IJ SOLN: 0.2500 mg | INTRAMUSCULAR | Status: DC | PRN

## 2013-09-21 MED ORDER — LACTATED RINGERS IV SOLN
INTRAVENOUS | Status: DC
  Administered 2013-09-21 – 2013-09-22 (×2): via INTRAVENOUS

## 2013-09-21 MED ORDER — GABAPENTIN 100 MG PO CAPS
100.0000 mg | ORAL_CAPSULE | Freq: Two times a day (BID) | ORAL | Status: DC
  Administered 2013-09-21 – 2013-09-22 (×2): 100 mg via ORAL
  Filled 2013-09-21 (×4): qty 1

## 2013-09-21 MED ORDER — LIDOCAINE HCL (CARDIAC) 20 MG/ML IV SOLN
INTRAVENOUS | Status: DC | PRN
  Administered 2013-09-21: 80 mg via INTRAVENOUS

## 2013-09-21 MED ORDER — ATENOLOL 25 MG PO TABS
25.0000 mg | ORAL_TABLET | Freq: Every day | ORAL | Status: DC
  Administered 2013-09-22: 25 mg via ORAL
  Filled 2013-09-21: qty 1

## 2013-09-21 MED ORDER — BISACODYL 5 MG PO TBEC: 5.0000 mg | DELAYED_RELEASE_TABLET | Freq: Every day | ORAL | Status: DC | PRN

## 2013-09-21 MED ORDER — COLCHICINE 0.6 MG PO TABS
0.6000 mg | ORAL_TABLET | Freq: Every day | ORAL | Status: DC
  Administered 2013-09-22: 0.6 mg via ORAL
  Filled 2013-09-21: qty 1

## 2013-09-21 MED ORDER — ROCURONIUM BROMIDE 50 MG/5ML IV SOLN
INTRAVENOUS | Status: AC
  Filled 2013-09-21: qty 1

## 2013-09-21 MED ORDER — ROCURONIUM BROMIDE 100 MG/10ML IV SOLN
INTRAVENOUS | Status: DC | PRN
  Administered 2013-09-21: 10 mg via INTRAVENOUS
  Administered 2013-09-21: 40 mg via INTRAVENOUS

## 2013-09-21 MED ORDER — DIPHENHYDRAMINE HCL 12.5 MG/5ML PO ELIX: 12.5000 mg | ORAL_SOLUTION | ORAL | Status: DC | PRN

## 2013-09-21 MED ORDER — TRAZODONE HCL 150 MG PO TABS
150.0000 mg | ORAL_TABLET | Freq: Every day | ORAL | Status: DC
  Administered 2013-09-21: 150 mg via ORAL
  Filled 2013-09-21 (×2): qty 1

## 2013-09-21 MED ORDER — POTASSIUM CITRATE ER 10 MEQ (1080 MG) PO TBCR
10.0000 meq | EXTENDED_RELEASE_TABLET | Freq: Two times a day (BID) | ORAL | Status: DC
  Administered 2013-09-21 – 2013-09-22 (×3): 10 meq via ORAL
  Filled 2013-09-21 (×5): qty 1

## 2013-09-21 MED ORDER — METHOCARBAMOL 100 MG/ML IJ SOLN
500.0000 mg | Freq: Four times a day (QID) | INTRAVENOUS | Status: DC | PRN
  Filled 2013-09-21: qty 5

## 2013-09-21 MED ORDER — MENTHOL 3 MG MT LOZG
1.0000 | LOZENGE | OROMUCOSAL | Status: DC | PRN
  Filled 2013-09-21: qty 9

## 2013-09-21 MED ORDER — PROPOFOL 10 MG/ML IV BOLUS
INTRAVENOUS | Status: AC
  Filled 2013-09-21: qty 20

## 2013-09-21 MED ORDER — ONDANSETRON HCL 4 MG/2ML IJ SOLN: 4.0000 mg | Freq: Once | INTRAMUSCULAR | Status: DC | PRN

## 2013-09-21 MED ORDER — PHENYLEPHRINE 40 MCG/ML (10ML) SYRINGE FOR IV PUSH (FOR BLOOD PRESSURE SUPPORT)
PREFILLED_SYRINGE | INTRAVENOUS | Status: AC
  Filled 2013-09-21: qty 10

## 2013-09-21 MED ORDER — METOCLOPRAMIDE HCL 5 MG/ML IJ SOLN: 5.0000 mg | Freq: Three times a day (TID) | INTRAMUSCULAR | Status: DC | PRN

## 2013-09-21 MED ORDER — TERAZOSIN HCL 2 MG PO CAPS
2.0000 mg | ORAL_CAPSULE | Freq: Every day | ORAL | Status: DC
  Administered 2013-09-21: 2 mg via ORAL
  Filled 2013-09-21 (×2): qty 1

## 2013-09-21 MED ORDER — ASPIRIN EC 81 MG PO TBEC
81.0000 mg | DELAYED_RELEASE_TABLET | Freq: Once | ORAL | Status: AC
  Administered 2013-09-21: 81 mg via ORAL
  Filled 2013-09-21: qty 1

## 2013-09-21 MED ORDER — PAROXETINE HCL 20 MG PO TABS
40.0000 mg | ORAL_TABLET | Freq: Every day | ORAL | Status: DC
  Filled 2013-09-21 (×2): qty 2

## 2013-09-21 MED ORDER — ACETAMINOPHEN 650 MG RE SUPP: 650.0000 mg | Freq: Four times a day (QID) | RECTAL | Status: DC | PRN

## 2013-09-21 MED ORDER — FLEET ENEMA 7-19 GM/118ML RE ENEM: 1.0000 | ENEMA | Freq: Once | RECTAL | Status: AC | PRN

## 2013-09-21 MED ORDER — NEOSTIGMINE METHYLSULFATE 1 MG/ML IJ SOLN
INTRAMUSCULAR | Status: DC | PRN
  Administered 2013-09-21: 3 mg via INTRAVENOUS

## 2013-09-21 MED ORDER — POLYETHYLENE GLYCOL 3350 17 G PO PACK: 17.0000 g | PACK | Freq: Every day | ORAL | Status: DC | PRN

## 2013-09-21 MED ORDER — KETOROLAC TROMETHAMINE 15 MG/ML IJ SOLN
15.0000 mg | Freq: Four times a day (QID) | INTRAMUSCULAR | Status: DC
  Administered 2013-09-21 – 2013-09-22 (×4): 15 mg via INTRAVENOUS
  Filled 2013-09-21 (×8): qty 1

## 2013-09-21 MED ORDER — ONDANSETRON HCL 4 MG/2ML IJ SOLN
INTRAMUSCULAR | Status: DC | PRN
  Administered 2013-09-21: 4 mg via INTRAVENOUS

## 2013-09-21 MED ORDER — LACTATED RINGERS IV SOLN
INTRAVENOUS | Status: DC
  Administered 2013-09-21: 07:00:00 via INTRAVENOUS

## 2013-09-21 MED ORDER — PHENOL 1.4 % MT LIQD
1.0000 | OROMUCOSAL | Status: DC | PRN
  Filled 2013-09-21: qty 177

## 2013-09-21 MED ORDER — OXYCODONE-ACETAMINOPHEN 5-325 MG PO TABS
1.0000 | ORAL_TABLET | ORAL | Status: DC | PRN
  Administered 2013-09-21 – 2013-09-22 (×4): 1 via ORAL
  Filled 2013-09-21 (×4): qty 1

## 2013-09-21 MED ORDER — DOCUSATE SODIUM 100 MG PO CAPS
100.0000 mg | ORAL_CAPSULE | Freq: Two times a day (BID) | ORAL | Status: DC
  Administered 2013-09-21 – 2013-09-22 (×2): 100 mg via ORAL
  Filled 2013-09-21 (×3): qty 1

## 2013-09-21 MED ORDER — ONDANSETRON HCL 4 MG/2ML IJ SOLN
INTRAMUSCULAR | Status: AC
  Filled 2013-09-21: qty 2

## 2013-09-21 MED ORDER — GLYCOPYRROLATE 0.2 MG/ML IJ SOLN
INTRAMUSCULAR | Status: AC
  Filled 2013-09-21: qty 3

## 2013-09-21 MED ORDER — LOTEPREDNOL ETABONATE 0.5 % OP SUSP
1.0000 [drp] | Freq: Two times a day (BID) | OPHTHALMIC | Status: DC
  Administered 2013-09-21 – 2013-09-22 (×2): 1 [drp] via OPHTHALMIC
  Filled 2013-09-21: qty 5

## 2013-09-21 MED ORDER — FEBUXOSTAT 40 MG PO TABS
80.0000 mg | ORAL_TABLET | Freq: Every day | ORAL | Status: DC
  Administered 2013-09-22: 80 mg via ORAL
  Filled 2013-09-21: qty 2

## 2013-09-21 MED ORDER — SODIUM CHLORIDE 0.9 % IR SOLN
Status: DC | PRN
  Administered 2013-09-21: 1000 mL

## 2013-09-21 MED ORDER — ONDANSETRON HCL 4 MG PO TABS: 4.0000 mg | ORAL_TABLET | Freq: Four times a day (QID) | ORAL | Status: DC | PRN

## 2013-09-21 MED ORDER — MIDAZOLAM HCL 2 MG/2ML IJ SOLN
INTRAMUSCULAR | Status: AC
  Filled 2013-09-21: qty 2

## 2013-09-21 MED ORDER — CEFAZOLIN SODIUM-DEXTROSE 2-3 GM-% IV SOLR
2.0000 g | Freq: Four times a day (QID) | INTRAVENOUS | Status: AC
  Administered 2013-09-21 – 2013-09-22 (×3): 2 g via INTRAVENOUS
  Filled 2013-09-21 (×3): qty 50

## 2013-09-21 MED ORDER — METHOCARBAMOL 500 MG PO TABS: 500.0000 mg | ORAL_TABLET | Freq: Four times a day (QID) | ORAL | Status: DC | PRN

## 2013-09-21 MED ORDER — FENTANYL CITRATE 0.05 MG/ML IJ SOLN
INTRAMUSCULAR | Status: DC | PRN
  Administered 2013-09-21 (×5): 50 ug via INTRAVENOUS

## 2013-09-21 MED ORDER — PHENYLEPHRINE HCL 10 MG/ML IJ SOLN
INTRAMUSCULAR | Status: DC | PRN
  Administered 2013-09-21: 40 ug via INTRAVENOUS
  Administered 2013-09-21 (×3): 80 ug via INTRAVENOUS

## 2013-09-21 MED ORDER — EPHEDRINE SULFATE 50 MG/ML IJ SOLN
INTRAMUSCULAR | Status: DC | PRN
  Administered 2013-09-21: 5 mg via INTRAVENOUS
  Administered 2013-09-21: 10 mg via INTRAVENOUS
  Administered 2013-09-21: 5 mg via INTRAVENOUS
  Administered 2013-09-21: 10 mg via INTRAVENOUS

## 2013-09-21 MED ORDER — PANTOPRAZOLE SODIUM 40 MG PO TBEC
40.0000 mg | DELAYED_RELEASE_TABLET | Freq: Every day | ORAL | Status: DC
  Administered 2013-09-21 – 2013-09-22 (×2): 40 mg via ORAL
  Filled 2013-09-21: qty 1

## 2013-09-21 MED ORDER — SODIUM CHLORIDE 0.9 % IV SOLN
10.0000 mg | INTRAVENOUS | Status: DC | PRN
  Administered 2013-09-21: 10 ug/min via INTRAVENOUS

## 2013-09-21 MED ORDER — ONDANSETRON HCL 4 MG/2ML IJ SOLN: 4.0000 mg | Freq: Four times a day (QID) | INTRAMUSCULAR | Status: DC | PRN

## 2013-09-21 MED ORDER — FENTANYL CITRATE 0.05 MG/ML IJ SOLN
INTRAMUSCULAR | Status: AC
  Filled 2013-09-21: qty 5

## 2013-09-21 MED ORDER — METOCLOPRAMIDE HCL 10 MG PO TABS: 5.0000 mg | ORAL_TABLET | Freq: Three times a day (TID) | ORAL | Status: DC | PRN

## 2013-09-21 MED ORDER — PROPOFOL 10 MG/ML IV BOLUS
INTRAVENOUS | Status: DC | PRN
  Administered 2013-09-21: 200 mg via INTRAVENOUS

## 2013-09-21 MED ORDER — LIDOCAINE HCL (CARDIAC) 20 MG/ML IV SOLN
INTRAVENOUS | Status: AC
  Filled 2013-09-21: qty 5

## 2013-09-21 MED ORDER — ALUM & MAG HYDROXIDE-SIMETH 200-200-20 MG/5ML PO SUSP: 30.0000 mL | ORAL | Status: DC | PRN

## 2013-09-21 MED ORDER — ACETAMINOPHEN 325 MG PO TABS
650.0000 mg | ORAL_TABLET | Freq: Four times a day (QID) | ORAL | Status: DC | PRN
Start: 2013-09-21 — End: 2013-09-22

## 2013-09-21 MED ORDER — GLYCOPYRROLATE 0.2 MG/ML IJ SOLN
INTRAMUSCULAR | Status: DC | PRN
  Administered 2013-09-21 (×2): 0.4 mg via INTRAVENOUS

## 2013-09-21 SURGICAL SUPPLY — 63 items
BLADE SAW SGTL 83.5X18.5 (BLADE) ×2 IMPLANT
CEMENT BONE DEPUY (Cement) ×1 IMPLANT
CLOTH BEACON ORANGE TIMEOUT ST (SAFETY) ×2 IMPLANT
CLSR STERI-STRIP ANTIMIC 1/2X4 (GAUZE/BANDAGES/DRESSINGS) ×1 IMPLANT
COVER SURGICAL LIGHT HANDLE (MISCELLANEOUS) ×2 IMPLANT
DRAPE INCISE IOBAN 66X45 STRL (DRAPES) ×2 IMPLANT
DRAPE SURG 17X11 SM STRL (DRAPES) ×2 IMPLANT
DRAPE SURG 17X23 STRL (DRAPES) ×2 IMPLANT
DRAPE U-SHAPE 47X51 STRL (DRAPES) ×2 IMPLANT
DRILL BIT 7/64X5 (BIT) IMPLANT
DRSG AQUACEL AG ADV 3.5X10 (GAUZE/BANDAGES/DRESSINGS) ×2 IMPLANT
DRSG MEPILEX BORDER 4X4 (GAUZE/BANDAGES/DRESSINGS) ×1 IMPLANT
DRSG MEPILEX BORDER 4X8 (GAUZE/BANDAGES/DRESSINGS) ×2 IMPLANT
DURAPREP 26ML APPLICATOR (WOUND CARE) ×4 IMPLANT
ELECT BLADE 4.0 EZ CLEAN MEGAD (MISCELLANEOUS) ×2
ELECT CAUTERY BLADE 6.4 (BLADE) ×2 IMPLANT
ELECT REM PT RETURN 9FT ADLT (ELECTROSURGICAL) ×2
ELECTRODE BLDE 4.0 EZ CLN MEGD (MISCELLANEOUS) ×1 IMPLANT
ELECTRODE REM PT RTRN 9FT ADLT (ELECTROSURGICAL) ×1 IMPLANT
FACESHIELD LNG OPTICON STERILE (SAFETY) ×6 IMPLANT
GLENOID ANCHOR PEG CROSSLK 48 (Orthopedic Implant) ×1 IMPLANT
GLOVE BIO SURGEON STRL SZ7.5 (GLOVE) ×2 IMPLANT
GLOVE BIO SURGEON STRL SZ8 (GLOVE) ×2 IMPLANT
GLOVE EUDERMIC 7 POWDERFREE (GLOVE) ×2 IMPLANT
GLOVE SS BIOGEL STRL SZ 7.5 (GLOVE) ×1 IMPLANT
GLOVE SUPERSENSE BIOGEL SZ 7.5 (GLOVE) ×1
GOWN STRL NON-REIN LRG LVL3 (GOWN DISPOSABLE) ×2 IMPLANT
GOWN STRL REIN XL XLG (GOWN DISPOSABLE) ×4 IMPLANT
HEAD HUMERAL 52MMX18MM (Trauma) IMPLANT
HUMERAL HEAD 52MMX18MM (Trauma) ×2 IMPLANT
HUMERAL STEM 14MM (Trauma) ×1 IMPLANT
KIT BASIN OR (CUSTOM PROCEDURE TRAY) ×2 IMPLANT
KIT ROOM TURNOVER OR (KITS) ×2 IMPLANT
MANIFOLD NEPTUNE II (INSTRUMENTS) ×2 IMPLANT
NDL HYPO 25GX1X1/2 BEV (NEEDLE) IMPLANT
NDL SUT 6 .5 CRC .975X.05 MAYO (NEEDLE) ×1 IMPLANT
NEEDLE HYPO 25GX1X1/2 BEV (NEEDLE) IMPLANT
NEEDLE MAYO TAPER (NEEDLE) ×2
NS IRRIG 1000ML POUR BTL (IV SOLUTION) ×2 IMPLANT
PACK SHOULDER (CUSTOM PROCEDURE TRAY) ×2 IMPLANT
PAD ARMBOARD 7.5X6 YLW CONV (MISCELLANEOUS) ×4 IMPLANT
PASSER SUT SWANSON 36MM LOOP (INSTRUMENTS) IMPLANT
PIN METAGLENE 2.5 (PIN) ×2 IMPLANT
SLING ARM LRG ADULT FOAM STRAP (SOFTGOODS) IMPLANT
SLING ARM XL FOAM STRAP (SOFTGOODS) ×2 IMPLANT
SMARTMIX MINI TOWER (MISCELLANEOUS) ×2
SPONGE LAP 18X18 X RAY DECT (DISPOSABLE) ×2 IMPLANT
SPONGE LAP 4X18 X RAY DECT (DISPOSABLE) ×2 IMPLANT
STRIP CLOSURE SKIN 1/2X4 (GAUZE/BANDAGES/DRESSINGS) ×2 IMPLANT
SUCTION FRAZIER TIP 10 FR DISP (SUCTIONS) ×2 IMPLANT
SUT BONE WAX W31G (SUTURE) IMPLANT
SUT FIBERWIRE #2 38 T-5 BLUE (SUTURE) ×4
SUT MNCRL AB 3-0 PS2 18 (SUTURE) ×2 IMPLANT
SUT VIC AB 1 CT1 27 (SUTURE) ×6
SUT VIC AB 1 CT1 27XBRD ANBCTR (SUTURE) ×3 IMPLANT
SUT VIC AB 2-0 CT1 27 (SUTURE) ×4
SUT VIC AB 2-0 CT1 TAPERPNT 27 (SUTURE) ×2 IMPLANT
SUTURE FIBERWR #2 38 T-5 BLUE (SUTURE) ×2 IMPLANT
SYR CONTROL 10ML LL (SYRINGE) IMPLANT
TOWEL OR 17X24 6PK STRL BLUE (TOWEL DISPOSABLE) ×2 IMPLANT
TOWEL OR 17X26 10 PK STRL BLUE (TOWEL DISPOSABLE) ×2 IMPLANT
TOWER SMARTMIX MINI (MISCELLANEOUS) ×1 IMPLANT
WATER STERILE IRR 1000ML POUR (IV SOLUTION) ×2 IMPLANT

## 2013-09-21 NOTE — Anesthesia Preprocedure Evaluation (Addendum)
Anesthesia Evaluation  Patient identified by MRN, date of birth, ID band Patient awake    Reviewed: Allergy & Precautions, H&P , NPO status , Patient's Chart, lab work & pertinent test results  Airway Mallampati: I      Dental  (+) Teeth Intact   Pulmonary former smoker,          Cardiovascular + CAD and + Peripheral Vascular Disease     Neuro/Psych Anxiety  Neuromuscular disease    GI/Hepatic hiatal hernia, GERD-  ,(+) Hepatitis -, C  Endo/Other    Renal/GU CRFRenal disease     Musculoskeletal   Abdominal   Peds  Hematology   Anesthesia Other Findings   Reproductive/Obstetrics                          Anesthesia Physical Anesthesia Plan  ASA: III  Anesthesia Plan: General   Post-op Pain Management:    Induction: Intravenous  Airway Management Planned: Oral ETT  Additional Equipment:   Intra-op Plan:   Post-operative Plan: Extubation in OR  Informed Consent: I have reviewed the patients History and Physical, chart, labs and discussed the procedure including the risks, benefits and alternatives for the proposed anesthesia with the patient or authorized representative who has indicated his/her understanding and acceptance.     Plan Discussed with:   Anesthesia Plan Comments:         Anesthesia Quick Evaluation

## 2013-09-21 NOTE — Transfer of Care (Signed)
Immediate Anesthesia Transfer of Care Note  Patient: Derek [name]  Procedure(s) Performed: Procedure(s): LEFT TOTAL SHOULDER ARTHROPLASTY (Left)  Patient Location: PACU  Anesthesia Type:General  Level of Consciousness: awake, alert  and oriented  Airway & Oxygen Therapy: Patient Spontanous Breathing and Patient connected to nasal cannula oxygen  Post-op Assessment: Report given to PACU RN, Post -op Vital signs reviewed and stable and Patient moving all extremities X 4  Post vital signs: Reviewed and stable  Complications: No apparent anesthesia complications

## 2013-09-21 NOTE — Anesthesia Procedure Notes (Addendum)
Anesthesia Regional Block:  Interscalene brachial plexus block  Pre-Anesthetic Checklist: ,, timeout performed, Correct Patient, Correct Site, Correct Laterality, Correct Procedure, Correct Position, site marked, Risks and benefits discussed,  Surgical consent,  Pre-op evaluation,  At surgeon's request and post-op pain management  Laterality: Left  Prep: chloraprep and alcohol swabs       Needles:  Injection technique: Single-shot  Needle Type: Stimulator Needle - 40        Needle insertion depth: 3 cm   Additional Needles:  Procedures: nerve stimulator Interscalene brachial plexus block  Nerve Stimulator or Paresthesia:  Response: 0.5 mA, 0.1 ms, 3 cm  Additional Responses:   Narrative:  Start time: 09/21/2013 7:05 AM End time: 09/21/2013 7:10 AM Injection made incrementally with aspirations every 5 mL.  Performed by: Personally  Anesthesiologist: Sharolyn Douglas MD  Additional Notes: Pt accepts procedure w/ risks. 15cc 0.5% Marcaine w/ epi w/o difficulty or discomfort. GES   Procedure Name: Intubation Date/Time: 09/21/2013 7:42 AM Performed by: Rush Farmer E Pre-anesthesia Checklist: Patient identified, Emergency Drugs available, Suction available, Patient being monitored and Timeout performed Patient Re-evaluated:Patient Re-evaluated prior to inductionOxygen Delivery Method: Circle system utilized Preoxygenation: Pre-oxygenation with 100% oxygen Intubation Type: IV induction Ventilation: Mask ventilation without difficulty Laryngoscope Size: Mac and 4 Grade View: Grade I Tube type: Oral Tube size: 7.5 mm Number of attempts: 1 Airway Equipment and Method: Stylet Placement Confirmation: ETT inserted through vocal cords under direct vision,  positive ETCO2 and breath sounds checked- equal and bilateral Secured at: 22 cm Tube secured with: Tape Dental Injury: Teeth and Oropharynx as per pre-operative assessment

## 2013-09-21 NOTE — Anesthesia Postprocedure Evaluation (Signed)
  Anesthesia Post-op Note  Patient: Derek [name]  Procedure(s) Performed: Procedure(s): LEFT TOTAL SHOULDER ARTHROPLASTY (Left)  Patient Location: PACU  Anesthesia Type:General and GA combined with regional for post-op pain  Level of Consciousness: awake, alert , oriented and patient cooperative  Airway and Oxygen Therapy: Patient Spontanous Breathing  Post-op Pain: mild  Post-op Assessment: Post-op Vital signs reviewed, Patient's Cardiovascular Status Stable, Respiratory Function Stable, Patent Airway, No signs of Nausea or vomiting and Pain level controlled  Post-op Vital Signs: stable  Complications: No apparent anesthesia complications

## 2013-09-21 NOTE — H&P (Signed)
[name]    Chief Complaint: Left Shoulder Osteoarthritis HPI: The patient is a 73 y.o. male with end stage left shoulder OA  Past Medical History  Diagnosis Date  . Hiatal hernia   . Gout     takes Uloric and Colchicine daily  . History of pulmonary embolism 7+yrs ago    both legs and both lungs   . History of gastritis   . Internal hemorrhoids   . History of hepatitis C 1978  . B12 deficiency     takes Vit 12 shot every 14days   . History of small bowel obstruction   . Pancytopenia     Hx of  . Crohn's disease   . RLS (restless legs syndrome)   . Pancreatitis 2010    elevated lipase and amylase, stranding in tail of pancreas, ? from Humira  . Rosacea conjunctivitis(372.31)     takes Minocin daily  . Small bowel obstruction   . Anxiety   . Thrombocytopenia     hx of  . GERD (gastroesophageal reflux disease)     takes Omeprazole daily  . Post-traumatic stress syndrome     takes Paxil nightly  . PTSD (post-traumatic stress disorder)   . Aorta aneurysm     takes Atenolol daily  . Heart murmur   . Pneumonia     hx of;last time about 65yr ago  . Peripheral neuropathy     takes Gabapentin daily  . Arthritis   . Joint pain   . Joint swelling   . History of blood transfusion   . History of colon polyps   . Hyperoxaluria     Intestinal  . Nephrolithiasis   . CKD (chronic kidney disease) stage 3, GFR 30-59 ml/min 08/22/2011  . Enlarged prostate   . Insomnia     takes Trazodone nightly  . History of MRSA infection 2010  . History of staph infection 1978    Past Surgical History  Procedure Laterality Date  . Hernia repair    . Cholecystectomy    . Hemicolectomy      Right  . Bowel resection    . Knee arthroscopy      Left  . Ligament repair      Left knee  . Ankle surgery      Right  . Foot surgery      Right  . Terminal ileum resection    . Appendectomy    . Colonoscopy    . Esophagogastroduodenoscopy      Family History  Problem  Relation Age of Onset  . Kidney disease Father   . Colon cancer Neg Hx   . Esophageal cancer Neg Hx   . Stomach cancer Neg Hx   . Rectal cancer Neg Hx     Social History:  reports that he has quit smoking. He has never used smokeless tobacco. He reports that he does not drink alcohol or use illicit drugs.  Allergies:  Allergies  Allergen Reactions  . Lorazepam     REACTION: hallucination    Medications Prior to Admission  Medication Sig Dispense Refill  . aspirin EC 81 MG tablet 1 tablet every other day      . atenolol (TENORMIN) 25 MG tablet Take 25 mg by mouth daily.        . Calcium Carbonate (CALCIUM 500 PO) Take 2 tablets by mouth 2 (two) times daily.       . colchicine 0.6 MG tablet Take 0.6 mg by mouth  daily.        . cyanocobalamin (,VITAMIN B-12,) 1000 MCG/ML injection Inject 1,000 mcg into the muscle every 14 (fourteen) days.      . febuxostat (ULORIC) 40 MG tablet Take 80 mg by mouth daily.      . fish oil-omega-3 fatty acids 1000 MG capsule Take 1 capsule (1 g total) by mouth 2 (two) times daily.      Marland Kitchen gabapentin (NEURONTIN) 100 MG capsule Take 100 mg by mouth 2 (two) times daily.       . hydrOXYzine (VISTARIL) 25 MG capsule Take 25-50 mg by mouth 3 (three) times daily as needed for anxiety (May take 2 capsules at bedtime if needed).      Marland Kitchen loteprednol (LOTEMAX) 0.5 % ophthalmic suspension Place 1 drop into both eyes 2 (two) times daily.      . Magnesium Oxide 420 MG TABS Take 1 tablet by mouth 2 (two) times daily.       . minocycline (MINOCIN,DYNACIN) 100 MG capsule Take 100 mg by mouth daily.      . Multiple Vitamin (MULTIVITAMIN) tablet Take 1 tablet by mouth daily.        Marland Kitchen omeprazole (PRILOSEC) 20 MG capsule Take 20 mg by mouth daily.        Marland Kitchen PARoxetine (PAXIL) 40 MG tablet Take 40 mg by mouth at bedtime.        . potassium citrate (UROCIT-K) 10 MEQ (1080 MG) SR tablet Take 10 mEq by mouth 2 (two) times daily.      Marland Kitchen terazosin (HYTRIN) 2 MG capsule Take 2 mg by  mouth at bedtime.       . traZODone (DESYREL) 150 MG tablet Take 150 mg by mouth at bedtime.           Physical Exam: ;eft shoulder with painful and restricted motion as noted at recent office visits  Vitals  Temp:  [97.4 F (36.3 C)] 97.4 F (36.3 C) (02/19 0600) Pulse Rate:  [50] 50 (02/19 0555) Resp:  [18] 18 (02/19 0555) BP: (132)/(72) 132/72 mmHg (02/19 0555) SpO2:  [100 %] 100 % (02/19 0555)  Assessment/Plan  Impression: Left Shoulder Osteoarthritis  Plan of Action: Procedure(s): LEFT TOTAL SHOULDER ARTHROPLASTY  Nahsir Venezia M 09/21/2013, 7:33 AM

## 2013-09-21 NOTE — Preoperative (Signed)
Beta Blockers   Reason not to administer Beta Blockers:BB taken at 4:30 am.

## 2013-09-21 NOTE — Discharge Instructions (Signed)
° °  Metta Clines. Supple, M.D., F.A.A.O.S. Orthopaedic Surgery Specializing in Arthroscopic and Reconstructive Surgery of the Shoulder and Knee [phone] [address] [place], [place] [address] - Fax [phone]   POST-OP TOTAL SHOULDER REPLACEMENT/SHOULDER HEMIARTHROPLASTY INSTRUCTIONS  1. Call the office at [phone] to schedule your first post-op appointment 10-14 days from the date of your surgery.  2. The bandage over your incision is waterproof. You may begin showering with this dressing on. You may leave this dressing on until first follow up appointment within 2 weeks. If you would like to remove it you may do so after the 5th day. Go slow and tug at the borders gently to break the bond the dressing has with the skin. The steri strips may come off with the dressing. At this point if there is no drainage it is okay to go without a bandage or you may cover it with a light guaze and tape. Leave the steri-strips in place over your incision. You can expect drainage that is bloody or yellow in nature that should gradually decrease from day of surgery. Change your dressing daily until drainage is completely resolved, then you may feel free to go without a bandage. You can also expect significant bruising around your shoulder that will drift down your arm and into your chest wall. This is very normal and should resolve over several days.   3. Wear your sling/immobilizer at all times except to perform the exercises below or to occasionally let your arm dangle by your side to stretch your elbow. You also need to sleep in your sling immobilizer until instructed otherwise.  4. Range of motion to your elbow, wrist, and hand are encouraged 3-5 times daily. Exercise to your hand and fingers helps to reduce swelling you may experience.  5. Utilize ice to the shoulder 3-5 times minimum a day and additionally if you are experiencing pain.  6. Prescriptions for a pain medication and a muscle  relaxant are provided for you. It is recommended that if you are experiencing pain that you pain medication alone is not controlling, add the muscle relaxant along with the pain medication which can give additional pain relief. The first 1-2 days is generally the most severe of your pain and then should gradually decrease. As your pain lessens it is recommended that you decrease your use of the pain medications to an "as needed basis'" only and to always comply with the recommended dosages of the pain medications.  7. Pain medications can produce constipation along with their use. If you experience this, the use of an over the counter stool softener or laxative daily is recommended.   8. For most patients, if insurance allows, home health services to include therapy has been arranged.  9. For additional questions or concerns, please do not hesitate to call the office. If after hours there is an answering service to forward your concerns to the physician on call.  POST-OP EXERCISES  Pendulum Exercises  Perform pendulum exercises while standing and bending at the waist. Support your uninvolved arm on a table or chair and allow your operated arm to hang freely. Make sure to do these exercises passively - not using you shoulder muscles.  Repeat 20 times. Do 3 sessions per day.

## 2013-09-21 NOTE — Op Note (Signed)
09/21/2013  9:43 AM  PATIENT:   Derek [name]  73 y.o. male  PRE-OPERATIVE DIAGNOSIS:  Left Shoulder Osteoarthritis  POST-OPERATIVE DIAGNOSIS:  same  PROCEDURE:  L TSA #14 stem, 52X18 eccentric head, 48 glenoid  SURGEON:  Karimah Winquist, Metta Clines M.D.  ASSISTANTS: Shuford pac   ANESTHESIA:   GET + ISB   EBL: 300  SPECIMEN:  none  Drains: hemovac X1   PATIENT DISPOSITION:  PACU - hemodynamically stable.    PLAN OF CARE: Admit to inpatient   Dictation# [id number]

## 2013-09-21 NOTE — Progress Notes (Signed)
Floor called Short Stay concerned about missing ring. Advised that per our pre-op check list that ring was given to [facility]- wife. Checked room 42 where patient was per patient list and did not locate ring. I checked floor, in cabinets, and drawers. Floor advised of same.

## 2013-09-21 NOTE — Progress Notes (Signed)
Utilization review completed.  

## 2013-09-22 ENCOUNTER — Encounter (HOSPITAL_COMMUNITY): Payer: Self-pay | Admitting: Orthopedic Surgery

## 2013-09-22 MED ORDER — HYDROMORPHONE HCL 2 MG PO TABS: 2.0000 mg | ORAL_TABLET | ORAL | Status: DC | PRN

## 2013-09-22 MED ORDER — HYDROCODONE-ACETAMINOPHEN 5-325 MG PO TABS: 1.0000 | ORAL_TABLET | ORAL | Status: DC | PRN

## 2013-09-22 NOTE — Op Note (Signed)
Derek [name], Derek [name]            ACCOUNT NO.:  [id number]  MEDICAL RECORD NO.:  [id number]  LOCATION:  [facility]IN[facility]                         FACILITY:  [facility]  PHYSICIAN:  Metta Clines. Keaja Reaume, M.D.  DATE OF BIRTH:  27-Dec-1940  DATE OF PROCEDURE:  09/21/2013 DATE OF DISCHARGE:  09/08/2013                              OPERATIVE REPORT   PREOPERATIVE DIAGNOSIS:  End-stage left shoulder osteoarthrosis.  POSTOPERATIVE DIAGNOSIS:  End-stage left shoulder osteoarthrosis.  PROCEDURE:  Left total shoulder arthroplasty utilizing a press-fit size 14 DePuy global stem with a 52 x 18 eccentric head and a cemented pegged 48 glenoid.  SURGEON:  Metta Clines. Amere Iott, M.D.  Terrence DupontOlivia Mackie A. Shuford, PA-C.  ANESTHESIA:  General endotracheal as well as an interscalene block.  ESTIMATED BLOOD LOSS:  300 mL.  DRAINS:  Hemovac x1.  HISTORY:  Derek [name] is a 73 year old gentleman, who has had chronic and progressive increasing left shoulder pain with significant functional limitations.  He was brought to the operating room at this time for planned left total shoulder arthroplasty.  Preoperatively, I counseled Derek [name] regarding treatment options and risks versus benefits thereof.  Possible surgical complications were all reviewed including the potential for bleeding, infection, neurovascular injury, persistent pain, loss of motion, anesthetic complication, failure of the implant and possible need for additional surgery.  He understands and accepts and agrees with our planned procedure.  PROCEDURE IN DETAIL:  After undergoing routine preop evaluation, the patient received prophylactic antibiotics and a interscalene block was established in the holding area by the Anesthesia Department.  He was placed supine on the operative table, underwent smooth induction of a general endotracheal anesthesia.  Placed in beach-chair position and appropriately padded and protected.  The right shoulder girdle  region was then sterilely prepped and draped in standard fashion.  Time-out was called.  An anterior approach of the left shoulder was made through a 12 cm incision along the deltopectoral interval.  Skin flaps were elevated and electrocautery was used for hemostasis.  The cephalic vein was identified and retracted laterally with the deltoid.  The upper cm of the pec major was tenotomized to enhance exposure.  Adhesions were divided in the subdeltoid bursal region, and the conjoined tendon was identified, mobilized, and retracted medially and self-retaining retractors were placed.  At this point, we unroofed the biceps tendon at the bicipital groove, tenotomized this for later tenodesis and then split the rotator interval from the bicipital groove through the base of the coracoid and then divided the subscapularis away from the lesser tuberosity leaving a 1 cm cuff in the distal tissue for later repair.  I then mobilized the subscapularis and then divided the capsule tissues from the anteroinferior, posteroinferior aspects of the humeral head to allow to deliver the head through the wound.  We then carefully protected the rotator cuff and marked our proposed humeral head resection using the extramedullary guide.  I then performed the humeral head resection with an oscillating saw.  We then used hand reaming to gain access to the humeral medullary canal up to size 14.  We used a cookie cutter broach at 30 degrees of retroversion and broached up to size 14 with fixation.  I then used a rongeur to remove the osteophytes on the anterior and inferior aspects of the humeral head.  Metal cap was placed to the cut surface of the proximal humerus.  At this point, we then exposed the glenoid with combination of Fukuda, pitchfork, and snake tongue retractors.  Divided the anterior capsular tissues to allow mobilization of the subscapularis.  I then performed a circumferential labral resection.  Once  I complete exposure of the periphery of the glenoid, we then placed a guide pin in the center of the glenoid and then reamed the glenoid with a 48 reamer to a stable solid subchondral bony bed.  Residual soft tissues of the periphery of the glenoid were carefully removed.  We then placed our central drill hole, followed by the peripheral peg holes on the glenoid and the trial components showed excellent fixation.  The glenoid was then meticulously cleaned and dried.  Cement was mixed in the appropriate consistency.  We introduced the cement into the peripheral peg holes.  The 48 peg glenoid was then impacted in position and all the cement was allowed to harden with excellent fit and fixation.  We then returned our attention to the proximal humerus, where we placed the humeral stem with abundant bone grafting harvested from the humeral head, and the terminal seeding showed excellent fit and fixation at approximately 30 degrees of retroversion and then making the native retroversion.  I then performed a series of trial reductions and the best soft tissue balance and stability was achieved with the 52 x 18 eccentric head.  The final 52 x 18 head was impacted after the [facility] taper was meticulously cleaned and dried.  Final reduction showed good stability of the implant, good position and good stability of the soft tissues.  At this point, the subscapularis was then repaired back to the lesser tuberosity through the soft tissue cuff of tissue with a #2 Fiber wires and then we closed the rotator interval with repair of figure-of-eight #2 FiberWire sutures.  The biceps was then tenodesed at the upper level of the pec major with a #2 FiberWire.  The wound was copiously irrigated. Hemostasis was obtained.  We did place a Hemovac drain, which was brought out inferolaterally.  The deltopectoral interval was then reapproximated with a series of figure-of-eight #1 Vicryl sutures, 2-0 Vicryl were used  for the subcu layer and intracuticular 3-0 Monocryl for the skin followed by Steri-Strips.  Dry dressings were applied.  The left arm was placed into a sling, and the patient was awakened, extubated, and taken to recovery room in stable condition.  Jenetta Loges, PA-C was used as Environmental consultant throughout this case essential for help with positioning of the patient, positioning of the extremity, management of the retractors, tissue manipulation, suture management, implantation of the prosthesis, wound closer, and intraoperative decision making.     Metta Clines. Janelle Culton, M.D.     KMS/MEDQ  D:  09/21/2013  T:  09/22/2013  Job:  [id number]
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
## 2013-09-22 NOTE — Evaluation (Signed)
Occupational Therapy Evaluation and Discharge Patient Details Name: Derek [name] MRN: [id number] DOB: 31-Aug-1940 Today's Date: 09/22/2013 Time: 5027-7412 OT Time Calculation (min): 34 min  OT Assessment / Plan / Recommendation History of present illness Left TSA   Clinical Impression   This 73 yo male admitted and underwent above presents to acute OT with all education completed with pt and wife. Acute OT will sign off.    OT Assessment  Progress rehab of shoulder as ordered by MD at follow-up appointment    Follow Up Recommendations  No OT follow up       Equipment Recommendations  None recommended by OT          Precautions / Restrictions Precautions Precautions: Shoulder Shoulder Interventions: Shoulder sling/immobilizer;At all times;Off for dressing/bathing/exercises Required Braces or Orthoses: Sling Restrictions Weight Bearing Restrictions: Yes LUE Weight Bearing: Non weight bearing   Pertinent Vitals/Pain 3/10 shoulder; repositioned     Acute Rehab OT Goals Patient Stated Goal: Home today  Visit Information  Last OT Received On: 09/22/13 Assistance Needed: +1 History of Present Illness: Left TSA       Prior Functioning     Home Living Family/patient expects to be discharged to:: Private residence Living Arrangements: Spouse/significant other Available Help at Discharge: Family;Available 24 hours/day Prior Function Level of Independence: Independent Communication Communication: No difficulties Dominant Hand: Right         Vision/Perception Vision - History Patient Visual Report: No change from baseline   Cognition  Cognition Arousal/Alertness: Awake/alert Behavior During Therapy: WFL for tasks assessed/performed Overall Cognitive Status: Within Functional Limits for tasks assessed    Extremity/Trunk Assessment Upper Extremity Assessment Upper Extremity Assessment: LUE deficits/detail LUE Deficits / Details: This admission for  shoulder sx; elbow to hand WNL LUE Coordination: decreased gross motor     Mobility Bed Mobility Overal bed mobility: Independent Transfers Overall transfer level: Needs assistance Transfers: Sit to/from Stand Sit to Stand: Supervision     Exercise Other Exercises Other Exercises: Educated on Dr. Susie Cassette total shoulder protocol exercises with pt/wife return demonstration. (30,60,90, pendulums) Donning/doffing shirt without moving shoulder: Caregiver independent with task Method for sponge bathing under operated UE: Set-up;Supervision/safety Donning/doffing sling/immobilizer: Caregiver independent with task Correct positioning of sling/immobilizer: Caregiver independent with task Pendulum exercises (written home exercise program): Min-guard ROM for elbow, wrist and digits of operated UE: Supervision/safety Sling wearing schedule (on at all times/off for ADL's): Independent Proper positioning of operated UE when showering: Independent Dressing change:  (NA) Positioning of UE while sleeping: Independent      End of Session OT - End of Session Equipment Utilized During Treatment:  (sling) Activity Tolerance: Patient tolerated treatment well Patient left: with family/visitor present;with call bell/phone within reach (sitting EOB with wife to A him with getting dressed) Nurse Communication:  (Pt ready from OT standpoint, pt/wife will let nurse know when they are ready to go)       Almon Register [phone] 09/22/2013, 10:43 AM

## 2013-10-06 DIAGNOSIS — M19019 Primary osteoarthritis, unspecified shoulder: Secondary | ICD-10-CM | POA: Diagnosis not present

## 2013-10-12 ENCOUNTER — Ambulatory Visit (HOSPITAL_COMMUNITY)
Admission: RE | Admit: 2013-10-12 | Discharge: 2013-10-12 | Disposition: A | Discharge status: Home / Self Care | Payer: Medicare Other | Source: Ambulatory Visit | Attending: Orthopedic Surgery | Admitting: Orthopedic Surgery

## 2013-10-12 DIAGNOSIS — M2560 Stiffness of unspecified joint, not elsewhere classified: Secondary | ICD-10-CM

## 2013-10-12 DIAGNOSIS — N183 Chronic kidney disease, stage 3 unspecified: Secondary | ICD-10-CM | POA: Insufficient documentation

## 2013-10-12 DIAGNOSIS — M25519 Pain in unspecified shoulder: Secondary | ICD-10-CM | POA: Diagnosis not present

## 2013-10-12 DIAGNOSIS — IMO0001 Reserved for inherently not codable concepts without codable children: Secondary | ICD-10-CM | POA: Insufficient documentation

## 2013-10-12 DIAGNOSIS — R29898 Other symptoms and signs involving the musculoskeletal system: Secondary | ICD-10-CM

## 2013-10-12 DIAGNOSIS — M25619 Stiffness of unspecified shoulder, not elsewhere classified: Secondary | ICD-10-CM | POA: Insufficient documentation

## 2013-10-12 DIAGNOSIS — M25612 Stiffness of left shoulder, not elsewhere classified: Secondary | ICD-10-CM | POA: Insufficient documentation

## 2013-10-12 DIAGNOSIS — M6281 Muscle weakness (generalized): Secondary | ICD-10-CM | POA: Diagnosis not present
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
## 2013-10-16 DIAGNOSIS — D631 Anemia in chronic kidney disease: Secondary | ICD-10-CM | POA: Diagnosis not present

## 2013-10-16 DIAGNOSIS — N2581 Secondary hyperparathyroidism of renal origin: Secondary | ICD-10-CM | POA: Diagnosis not present

## 2013-10-16 DIAGNOSIS — K5090 Crohn's disease, unspecified, without complications: Secondary | ICD-10-CM | POA: Diagnosis not present

## 2013-10-16 DIAGNOSIS — N183 Chronic kidney disease, stage 3 unspecified: Secondary | ICD-10-CM | POA: Diagnosis not present

## 2013-10-16 DIAGNOSIS — M109 Gout, unspecified: Secondary | ICD-10-CM | POA: Diagnosis not present

## 2013-10-17 ENCOUNTER — Ambulatory Visit (HOSPITAL_COMMUNITY)
Admission: RE | Admit: 2013-10-17 | Discharge: 2013-10-17 | Disposition: A | Discharge status: Home / Self Care | Payer: Medicare Other | Source: Ambulatory Visit | Attending: Family Medicine | Admitting: Family Medicine

## 2013-10-17 DIAGNOSIS — M6281 Muscle weakness (generalized): Secondary | ICD-10-CM

## 2013-10-17 DIAGNOSIS — IMO0001 Reserved for inherently not codable concepts without codable children: Secondary | ICD-10-CM | POA: Diagnosis not present

## 2013-10-17 DIAGNOSIS — M25519 Pain in unspecified shoulder: Secondary | ICD-10-CM

## 2013-10-17 DIAGNOSIS — M25619 Stiffness of unspecified shoulder, not elsewhere classified: Secondary | ICD-10-CM | POA: Diagnosis not present

## 2013-10-17 DIAGNOSIS — M25612 Stiffness of left shoulder, not elsewhere classified: Secondary | ICD-10-CM

## 2013-10-17 DIAGNOSIS — N183 Chronic kidney disease, stage 3 unspecified: Secondary | ICD-10-CM | POA: Diagnosis not present

## 2013-10-17 NOTE — Progress Notes (Signed)
Occupational Therapy Treatment Patient Details  Name: Derek [name] MRN: [id number] Date of Birth: 1941/03/19  Today's Date: 10/17/2013 Time: 8563-1497 OT Time Calculation (min): 35 min MFR 855-905 10' Therex 026-378 25'  Visit#: 2 of 24  Re-eval: 11/09/13    Authorization: Medicare A, Fanny Bien, and Generic Commercial  Authorization Time Period: Before 10th visit  Authorization Visit#: 2 of 10  Subjective Symptoms/Limitations Symptoms: S: I use ice a lot for the pain. I need to find out where I can buy an ice pack.  Pain Assessment Currently in Pain?: Yes Pain Score: 3  Pain Location: Shoulder Pain Orientation: Left Pain Type: Acute pain;Surgical pain  Precautions/Restrictions  Precautions Precautions: Shoulder Type of Shoulder Precautions: Per MD Supple Protocol: Weeks 3-6 (3/12-4/2): PROM, initate AAROM, rhytymic stabiliztion, isometrics, pulleys Weeks 6-10 (4/2-4/30): AAROM, pulleys, AROM, strengthening Weeks 9-12 (4/23-5/14) prone, progress strengthening  Exercise/Treatments Supine Protraction: PROM;10 reps Horizontal ABduction: PROM;10 reps External Rotation: PROM;10 reps Internal Rotation: PROM;10 reps Flexion: PROM;10 reps ABduction: PROM;10 reps Seated Elevation: AROM;10 reps Extension: AROM;10 reps Row: AROM;10 reps Therapy Ball Flexion: 15 reps ABduction: 15 reps ROM / Strengthening / Isometric Strengthening   Flexion: 3X3" Extension: 3X3" External Rotation: 3X3" Internal Rotation: 3X3" ABduction: 3X3" ADduction: 3X3"      Manual Therapy Manual Therapy: Myofascial release Myofascial Release: MFR and manual stretching to left upper arm, scapularis and trapezius region to decrease fascial restrictions and increase joint mobility in a pain free zone.   Occupational Therapy Assessment and Plan OT Assessment and Plan Clinical Impression Statement: A: Patient tolerated passive stretching to approx. 90 degrees. Increased pain at end of  stretch. Patient tolerated well. OT Plan: P: Continue to work on increasing PROM to [facility].    Goals Home Exercise Program Pt/caregiver will Perform Home Exercise Program: For increased ROM;For increased strengthening Short Term Goals Time to Complete Short Term Goals: 6 weeks Short Term Goal 1: Pt will be educated on HEP Short Term Goal 1 Progress: Progressing toward goal Short Term Goal 2: Pt will achieve PROM to [facility] in working towards improved overhead reaching Short Term Goal 2 Progress: Progressing toward goal Short Term Goal 3: Pt will have min-mod fascial restrictions in LUE to promote pain-free ROM Short Term Goal 3 Progress: Progressing toward goal Short Term Goal 4: Pt will have pain of less than 5/10 with ADL movements. Short Term Goal 4 Progress: Progressing toward goal Long Term Goals Time to Complete Long Term Goals: 12 weeks Long Term Goal 1: Pt will achieve highest level of functioning in all ADL, IADL, and leisure tasks. Long Term Goal 1 Progress: Progressing toward goal Long Term Goal 2: Pt will achieve AROM WNL in order to don pants and shirt Long Term Goal 2 Progress: Progressing toward goal Long Term Goal 3: Pt will have strength in his LUE of atleast 4+/5 in order to complete tasks necessary for woodworking tasks. Long Term Goal 3 Progress: Progressing toward goal Long Term Goal 4: Pt will have atleast min fascial restrictions in LUE. Long Term Goal 4 Progress: Progressing toward goal Long Term Goal 5: Pt will have pain of less than 2/10 during driving longer than 20 minutes. Long Term Goal 5 Progress: Progressing toward goal  Problem List Patient Active Problem List   Diagnosis Date Noted  . Pain in joint, shoulder region 10/12/2013  . Decreased range of motion of left shoulder 10/12/2013  . Muscle weakness (generalized) 10/12/2013  . Restriction of joint motion 10/12/2013  . S/P shoulder  replacement 09/21/2013  . Cardiomyopathy 09/07/2013  . Anxiety  08/22/2011  . CKD (chronic kidney disease) stage 3, GFR 30-59 ml/min 08/22/2011  . Thrombocytopenia 08/22/2011  . ARF (acute renal failure) 08/22/2011  . CORONARY ATHEROSCLEROSIS NATIVE CORONARY ARTERY 06/25/2010  . Ascending aortic aneurysm 06/26/2009  . CROHN'S DISEASE 06/26/2009  . CHEST PAIN UNSPECIFIED 06/26/2009  . PANCREATITIS, ACUTE 01/08/2009  . SMALL BOWEL OBSTRUCTION 12/27/2008  . B12 DEFICIENCY 05/09/2008  . Gout, unspecified 05/09/2008  . Pancytopenia 05/09/2008  . POST TRAUMATIC STRESS SYNDROME 05/09/2008  . INTERNAL HEMORRHOIDS 05/09/2008  . GERD 05/09/2008  . HIATAL HERNIA 05/09/2008  . RENAL DISEASE, CHRONIC, MILD 05/09/2008  . HEPATITIS C, HX OF 05/09/2008  . PULMONARY EMBOLISM, HX OF 05/09/2008  . GASTRITIS, HX OF 05/09/2008    End of Session Activity Tolerance: Patient tolerated treatment well General Behavior During Therapy: [facility] for tasks assessed/performed   Ailene Ravel, OTR/L,CBIS   10/17/2013, 9:30 AM

## 2013-10-19 ENCOUNTER — Ambulatory Visit (HOSPITAL_COMMUNITY): Payer: Medicare Other

## 2013-10-20 ENCOUNTER — Ambulatory Visit (HOSPITAL_COMMUNITY)
Admission: RE | Admit: 2013-10-20 | Discharge: 2013-10-20 | Disposition: A | Discharge status: Home / Self Care | Payer: Medicare Other | Source: Ambulatory Visit | Attending: Family Medicine | Admitting: Family Medicine

## 2013-10-20 DIAGNOSIS — M25519 Pain in unspecified shoulder: Secondary | ICD-10-CM | POA: Diagnosis not present

## 2013-10-20 DIAGNOSIS — IMO0001 Reserved for inherently not codable concepts without codable children: Secondary | ICD-10-CM | POA: Diagnosis not present

## 2013-10-20 DIAGNOSIS — M25619 Stiffness of unspecified shoulder, not elsewhere classified: Secondary | ICD-10-CM | POA: Diagnosis not present

## 2013-10-20 DIAGNOSIS — M6281 Muscle weakness (generalized): Secondary | ICD-10-CM | POA: Diagnosis not present

## 2013-10-20 DIAGNOSIS — N183 Chronic kidney disease, stage 3 unspecified: Secondary | ICD-10-CM | POA: Diagnosis not present

## 2013-10-20 NOTE — Progress Notes (Signed)
Occupational Therapy Treatment Patient Details  Name: Derek [name] MRN: [id number] Date of Birth: 09-05-1940  Today's Date: 10/20/2013 Time: 4765-4650 OT Time Calculation (min): 35 min Manual 813-830 (17') Therapeutic Exercises 354-656 (18')  Visit#: 3 of 24  Re-eval: 11/09/13    Authorization: Medicare A, [employer], and Generic Commercial  Authorization Time Period: Before 10th visit  Authorization Visit#: 3 of 10  Subjective Symptoms/Limitations Symptoms: S: "I haven't done anything much to it this morning. the dampness might be doing it." Limitations: Per MD Supple Protocol: Weeks 3-6 (3/12-4/2): PROM, initate AAROM, rhytymic stabiliztion, isometrics, pulleys       Weeks 6-10 (4/2-4/30): AAROM, pulleys, AROM, strengthening       Weeks 9-12 (4/23-5/14) prone, progress strengthening Pain Assessment Currently in Pain?: Yes Pain Score: 3  Pain Location: Shoulder Pain Orientation: Left Pain Type: Acute pain   Exercise/Treatments Supine Protraction: PROM;10 reps Horizontal ABduction: PROM;10 reps External Rotation: PROM;10 reps Internal Rotation: PROM;10 reps Flexion: PROM;10 reps ABduction: PROM;10 reps Seated Elevation: AROM;12 reps Extension: AROM;12 reps Row: AROM;12 reps Therapy Ball Flexion: 15 reps ABduction: 15 reps ROM / Strengthening / Isometric Strengthening   Flexion: 3X3" Extension: 3X3" External Rotation: 3X3" Internal Rotation: 3X3" ABduction: 3X3" ADduction: 3X3"    Manual Therapy Manual Therapy: Myofascial release Myofascial Release: MFR and manual stretching to left upper arm, scapularis and trapezius region to decrease fascial restrictions and increase joint mobility in a pain free zone  Occupational Therapy Assessment and Plan OT Assessment and Plan Clinical Impression Statement: Increaed tenderness this session with exericses - pt states likely due to dampnes outside. tolerated well, but still had pain getting to 90 degrees  flexion/abduction.   OT Plan: P: Continue to work on increasing PROM to [facility].    Goals Short Term Goals Short Term Goal 1: Pt will be educated on HEP Short Term Goal 1 Progress: Progressing toward goal Short Term Goal 2: Pt will achieve PROM to [facility] in working towards improved overhead reaching Short Term Goal 2 Progress: Progressing toward goal Short Term Goal 3: Pt will have min-mod fascial restrictions in LUE to promote pain-free ROM Short Term Goal 3 Progress: Progressing toward goal Short Term Goal 4: Pt will have pain of less than 5/10 with ADL movements. Short Term Goal 4 Progress: Progressing toward goal Long Term Goals Long Term Goal 1: Pt will achieve highest level of functioning in all ADL, IADL, and leisure tasks. Long Term Goal 1 Progress: Progressing toward goal Long Term Goal 2: Pt will achieve AROM WNL in order to don pants and shirt Long Term Goal 2 Progress: Progressing toward goal Long Term Goal 3: Pt will have strength in his LUE of atleast 4+/5 in order to complete tasks necessary for woodworking tasks. Long Term Goal 3 Progress: Progressing toward goal Long Term Goal 4: Pt will have atleast min fascial restrictions in LUE. Long Term Goal 4 Progress: Progressing toward goal Long Term Goal 5: Pt will have pain of less than 2/10 during driving longer than 20 minutes. Long Term Goal 5 Progress: Progressing toward goal  Problem List Patient Active Problem List   Diagnosis Date Noted  . Pain in joint, shoulder region 10/12/2013  . Decreased range of motion of left shoulder 10/12/2013  . Muscle weakness (generalized) 10/12/2013  . Restriction of joint motion 10/12/2013  . S/P shoulder replacement 09/21/2013  . Cardiomyopathy 09/07/2013  . Anxiety 08/22/2011  . CKD (chronic kidney disease) stage 3, GFR 30-59 ml/min 08/22/2011  . Thrombocytopenia 08/22/2011  .  ARF (acute renal failure) 08/22/2011  . CORONARY ATHEROSCLEROSIS NATIVE CORONARY ARTERY 06/25/2010  .  Ascending aortic aneurysm 06/26/2009  . CROHN'S DISEASE 06/26/2009  . CHEST PAIN UNSPECIFIED 06/26/2009  . PANCREATITIS, ACUTE 01/08/2009  . SMALL BOWEL OBSTRUCTION 12/27/2008  . B12 DEFICIENCY 05/09/2008  . Gout, unspecified 05/09/2008  . Pancytopenia 05/09/2008  . POST TRAUMATIC STRESS SYNDROME 05/09/2008  . INTERNAL HEMORRHOIDS 05/09/2008  . GERD 05/09/2008  . HIATAL HERNIA 05/09/2008  . RENAL DISEASE, CHRONIC, MILD 05/09/2008  . HEPATITIS C, HX OF 05/09/2008  . PULMONARY EMBOLISM, HX OF 05/09/2008  . GASTRITIS, HX OF 05/09/2008    End of Session Activity Tolerance: Patient tolerated treatment well General Behavior During Therapy: [facility] for tasks assessed/performed  GO    Bea Graff, MS, OTR/L [phone]  10/20/2013, 12:06 PM

## 2013-10-23 ENCOUNTER — Ambulatory Visit (HOSPITAL_COMMUNITY)
Admission: RE | Admit: 2013-10-23 | Discharge: 2013-10-23 | Disposition: A | Discharge status: Home / Self Care | Payer: Medicare Other | Source: Ambulatory Visit | Attending: Family Medicine | Admitting: Family Medicine

## 2013-10-23 DIAGNOSIS — M25519 Pain in unspecified shoulder: Secondary | ICD-10-CM | POA: Diagnosis not present

## 2013-10-23 DIAGNOSIS — M6281 Muscle weakness (generalized): Secondary | ICD-10-CM | POA: Diagnosis not present

## 2013-10-23 DIAGNOSIS — IMO0001 Reserved for inherently not codable concepts without codable children: Secondary | ICD-10-CM | POA: Diagnosis not present

## 2013-10-23 DIAGNOSIS — M25619 Stiffness of unspecified shoulder, not elsewhere classified: Secondary | ICD-10-CM | POA: Diagnosis not present

## 2013-10-23 DIAGNOSIS — N183 Chronic kidney disease, stage 3 unspecified: Secondary | ICD-10-CM | POA: Diagnosis not present

## 2013-10-23 NOTE — Progress Notes (Signed)
Occupational Therapy Treatment Patient Details  Name: Derek [name] MRN: [id number] Date of Birth: Jan 24, 1941  Today's Date: 10/23/2013 Time: 1224-8250 OT Time Calculation (min): 38 min Manual [phone] (23') Therapeutic Exercises 1000-1015 (15')  Visit#: 4 of 24  Re-eval: 11/09/13    Authorization: Medicare A, [employer], and Generic Commercial  Authorization Time Period: Before 10th visit  Authorization Visit#: 4 of 10  Subjective Symptoms/Limitations Symptoms: Everything's coming right along I think. Just sore and stiff today." Pain Assessment Currently in Pain?: Yes Pain Score: 4  Pain Location: Shoulder Pain Orientation: Left Pain Type: Acute pain  Precautions/Restrictions     Exercise/Treatments Supine Protraction: PROM;10 reps Horizontal ABduction: PROM;10 reps External Rotation: PROM;10 reps Internal Rotation: PROM;10 reps Flexion: PROM;10 reps ABduction: PROM;10 reps Seated Elevation: AROM;15 reps Extension: AROM;15 reps Row: AROM;15 reps Therapy Ball Flexion: 20 reps ABduction: 20 reps ROM / Strengthening / Isometric Strengthening Prot/Ret//Elev/Dep: 10 reps (max verbal cues for 3 up, 3 back) Flexion: 3X5" Extension: 3X5" External Rotation: 3X5" Internal Rotation: 3X5" ABduction: 3X5" ADduction: 3X5"    Manual Therapy Manual Therapy: Myofascial release Myofascial Release: MFR and manual stretching to left upper arm, scapularis and trapezius region to decrease fascial restrictions and increase joint mobility in a pain free zone.    Occupational Therapy Assessment and Plan OT Assessment and Plan Clinical Impression Statement: Pt still having pain with PROM - able to reach beyond 90 degrees in flexion this date, but only reached 90 degrees with multiple reps of stretching. Tolerated well addition of pre/ret/elev/dep - pt siwht some pain, but decreased after 10 reps. OT Plan: P: Continue to work on increasing PROM to [facility]. Follow up on  pro-ret-elev-dep   Goals Short Term Goals Short Term Goal 1: Pt will be educated on HEP Short Term Goal 1 Progress: Progressing toward goal Short Term Goal 2: Pt will achieve PROM to [facility] in working towards improved overhead reaching Short Term Goal 2 Progress: Progressing toward goal Short Term Goal 3: Pt will have min-mod fascial restrictions in LUE to promote pain-free ROM Short Term Goal 3 Progress: Progressing toward goal Short Term Goal 4: Pt will have pain of less than 5/10 with ADL movements. Short Term Goal 4 Progress: Progressing toward goal Long Term Goals Long Term Goal 1: Pt will achieve highest level of functioning in all ADL, IADL, and leisure tasks. Long Term Goal 1 Progress: Progressing toward goal Long Term Goal 2: Pt will achieve AROM WNL in order to don pants and shirt Long Term Goal 2 Progress: Progressing toward goal Long Term Goal 3: Pt will have strength in his LUE of atleast 4+/5 in order to complete tasks necessary for woodworking tasks. Long Term Goal 3 Progress: Progressing toward goal Long Term Goal 4: Pt will have atleast min fascial restrictions in LUE. Long Term Goal 4 Progress: Progressing toward goal Long Term Goal 5: Pt will have pain of less than 2/10 during driving longer than 20 minutes. Long Term Goal 5 Progress: Progressing toward goal  Problem List Patient Active Problem List   Diagnosis Date Noted  . Pain in joint, shoulder region 10/12/2013  . Decreased range of motion of left shoulder 10/12/2013  . Muscle weakness (generalized) 10/12/2013  . Restriction of joint motion 10/12/2013  . S/P shoulder replacement 09/21/2013  . Cardiomyopathy 09/07/2013  . Anxiety 08/22/2011  . CKD (chronic kidney disease) stage 3, GFR 30-59 ml/min 08/22/2011  . Thrombocytopenia 08/22/2011  . ARF (acute renal failure) 08/22/2011  . CORONARY ATHEROSCLEROSIS NATIVE  CORONARY ARTERY 06/25/2010  . Ascending aortic aneurysm 06/26/2009  . CROHN'S DISEASE 06/26/2009   . CHEST PAIN UNSPECIFIED 06/26/2009  . PANCREATITIS, ACUTE 01/08/2009  . SMALL BOWEL OBSTRUCTION 12/27/2008  . B12 DEFICIENCY 05/09/2008  . Gout, unspecified 05/09/2008  . Pancytopenia 05/09/2008  . POST TRAUMATIC STRESS SYNDROME 05/09/2008  . INTERNAL HEMORRHOIDS 05/09/2008  . GERD 05/09/2008  . HIATAL HERNIA 05/09/2008  . RENAL DISEASE, CHRONIC, MILD 05/09/2008  . HEPATITIS C, HX OF 05/09/2008  . PULMONARY EMBOLISM, HX OF 05/09/2008  . GASTRITIS, HX OF 05/09/2008    End of Session Activity Tolerance: Patient tolerated treatment well General Behavior During Therapy: [facility] for tasks assessed/performed  GO    Bea Graff, MS, OTR/L [phone]  10/23/2013, 11:59 AM

## 2013-10-26 ENCOUNTER — Ambulatory Visit (HOSPITAL_COMMUNITY)
Admission: RE | Admit: 2013-10-26 | Discharge: 2013-10-26 | Disposition: A | Discharge status: Home / Self Care | Payer: Medicare Other | Source: Ambulatory Visit | Attending: Family Medicine | Admitting: Family Medicine

## 2013-10-26 DIAGNOSIS — M25519 Pain in unspecified shoulder: Secondary | ICD-10-CM | POA: Diagnosis not present

## 2013-10-26 DIAGNOSIS — M6281 Muscle weakness (generalized): Secondary | ICD-10-CM | POA: Diagnosis not present

## 2013-10-26 DIAGNOSIS — IMO0001 Reserved for inherently not codable concepts without codable children: Secondary | ICD-10-CM | POA: Diagnosis not present

## 2013-10-26 DIAGNOSIS — M25619 Stiffness of unspecified shoulder, not elsewhere classified: Secondary | ICD-10-CM | POA: Diagnosis not present

## 2013-10-26 DIAGNOSIS — N183 Chronic kidney disease, stage 3 unspecified: Secondary | ICD-10-CM | POA: Diagnosis not present

## 2013-10-26 NOTE — Progress Notes (Signed)
Occupational Therapy Treatment Patient Details  Name: Derek [name] MRN: [id number] Date of Birth: Nov 08, 1940  Today's Date: 10/26/2013 Time: 1012-1105 OT Time Calculation (min): 53 min Manual 1012-1030 (18') Therapeutic Exercises 1030-1055 (25') Moist Heat pack 1055-1105 (10')  Visit#: 5 of 24  Re-eval: 11/09/13    Authorization: Medicare A, [employer], and Generic Commercial  Authorization Time Period: Before 10th visit  Authorization Visit#: 5 of 10  Subjective Symptoms/Limitations Symptoms: "I hadn't take any pain pills since last night. I'm trying to wean off them as much as possible." Limitations: Per MD Supple Protocol: Weeks 3-6 (3/12-4/2): PROM, initate AAROM, rhytymic stabiliztion, isometrics, pulleys       Weeks 6-10 (4/2-4/30): AAROM, pulleys, AROM, strengthening       Weeks 9-12 (4/23-5/14) prone, progress strengthening Pain Assessment Currently in Pain?: Yes Pain Score: 2  Pain Location: Shoulder Pain Orientation: Left Pain Type: Acute pain  Precautions/Restrictions     Exercise/Treatments Supine Protraction: PROM;10 reps Horizontal ABduction: PROM;10 reps External Rotation: PROM;10 reps Internal Rotation: PROM;10 reps Flexion: PROM;10 reps ABduction: PROM;10 reps Other Supine Exercises: Elbow flexion/extension 10 reps AROM Seated Elevation: AROM;15 reps Extension: AROM;15 reps Row: AROM;15 reps Pulleys Flexion: Other (comment) (10 reps) Therapy Ball Flexion: 20 reps ABduction: 20 reps ROM / Strengthening / Isometric Strengthening   Flexion: 3X5" Extension: 3X5" External Rotation: 3X5" Internal Rotation: 3X5" ABduction: 3X5" ADduction: 3X5"    Modalities Modalities: Moist Heat Manual Therapy Manual Therapy: Myofascial release Myofascial Release: MFR and manual stretching to left upper arm, scapularis and trapezius region to decrease fascial restrictions and increase joint mobility in a pain free zone.    Moist Heat  Therapy Number Minutes Moist Heat: 10 Minutes Moist Heat Location: Shoulder  Occupational Therapy Assessment and Plan OT Assessment and Plan Clinical Impression Statement: Pt had increaed soreness with elbow movements this date - pt reported pain with elbow flexion is new. pt had some pain with supination this date as well. Recommended elbow HEP for home use - pt verbalized understanding. Pt had overall continued increased tigghtness with supine PROM, but increased pain with end range stretching this session - applied heat pack at end of session to alleviate pain.  Added pulleys this session  - pt tolerated well few reps. OT Plan: P: Continue to work on increasing PROM to [facility]. Follow up on pro-ret-elev-dep and pulleys.   Goals Short Term Goals Short Term Goal 1: Pt will be educated on HEP Short Term Goal 1 Progress: Progressing toward goal Short Term Goal 2: Pt will achieve PROM to [facility] in working towards improved overhead reaching Short Term Goal 2 Progress: Progressing toward goal Short Term Goal 3: Pt will have min-mod fascial restrictions in LUE to promote pain-free ROM Short Term Goal 3 Progress: Progressing toward goal Short Term Goal 4: Pt will have pain of less than 5/10 with ADL movements. Short Term Goal 4 Progress: Progressing toward goal Long Term Goals Long Term Goal 1: Pt will achieve highest level of functioning in all ADL, IADL, and leisure tasks. Long Term Goal 1 Progress: Progressing toward goal Long Term Goal 2: Pt will achieve AROM WNL in order to don pants and shirt Long Term Goal 2 Progress: Progressing toward goal Long Term Goal 3: Pt will have strength in his LUE of atleast 4+/5 in order to complete tasks necessary for woodworking tasks. Long Term Goal 3 Progress: Progressing toward goal Long Term Goal 4: Pt will have atleast min fascial restrictions in LUE. Long Term Goal 4  Progress: Progressing toward goal Long Term Goal 5: Pt will have pain of less than 2/10  during driving longer than 20 minutes. Long Term Goal 5 Progress: Progressing toward goal  Problem List Patient Active Problem List   Diagnosis Date Noted  . Pain in joint, shoulder region 10/12/2013  . Decreased range of motion of left shoulder 10/12/2013  . Muscle weakness (generalized) 10/12/2013  . Restriction of joint motion 10/12/2013  . S/P shoulder replacement 09/21/2013  . Cardiomyopathy 09/07/2013  . Anxiety 08/22/2011  . CKD (chronic kidney disease) stage 3, GFR 30-59 ml/min 08/22/2011  . Thrombocytopenia 08/22/2011  . ARF (acute renal failure) 08/22/2011  . CORONARY ATHEROSCLEROSIS NATIVE CORONARY ARTERY 06/25/2010  . Ascending aortic aneurysm 06/26/2009  . CROHN'S DISEASE 06/26/2009  . CHEST PAIN UNSPECIFIED 06/26/2009  . PANCREATITIS, ACUTE 01/08/2009  . SMALL BOWEL OBSTRUCTION 12/27/2008  . B12 DEFICIENCY 05/09/2008  . Gout, unspecified 05/09/2008  . Pancytopenia 05/09/2008  . POST TRAUMATIC STRESS SYNDROME 05/09/2008  . INTERNAL HEMORRHOIDS 05/09/2008  . GERD 05/09/2008  . HIATAL HERNIA 05/09/2008  . RENAL DISEASE, CHRONIC, MILD 05/09/2008  . HEPATITIS C, HX OF 05/09/2008  . PULMONARY EMBOLISM, HX OF 05/09/2008  . GASTRITIS, HX OF 05/09/2008    End of Session Activity Tolerance: Patient tolerated treatment well General Behavior During Therapy: [facility] for tasks assessed/performed  GO    Bea Graff, MS, OTR/L [phone]  10/26/2013, 11:00 AM

## 2013-10-30 ENCOUNTER — Ambulatory Visit (HOSPITAL_COMMUNITY)
Admission: RE | Admit: 2013-10-30 | Discharge: 2013-10-30 | Disposition: A | Discharge status: Home / Self Care | Payer: Medicare Other | Source: Ambulatory Visit | Attending: Family Medicine | Admitting: Family Medicine

## 2013-10-30 DIAGNOSIS — IMO0001 Reserved for inherently not codable concepts without codable children: Secondary | ICD-10-CM | POA: Diagnosis not present

## 2013-10-30 DIAGNOSIS — N183 Chronic kidney disease, stage 3 unspecified: Secondary | ICD-10-CM | POA: Diagnosis not present

## 2013-10-30 DIAGNOSIS — M25519 Pain in unspecified shoulder: Secondary | ICD-10-CM | POA: Diagnosis not present

## 2013-10-30 DIAGNOSIS — M25619 Stiffness of unspecified shoulder, not elsewhere classified: Secondary | ICD-10-CM | POA: Diagnosis not present

## 2013-10-30 DIAGNOSIS — M6281 Muscle weakness (generalized): Secondary | ICD-10-CM | POA: Diagnosis not present

## 2013-10-30 NOTE — Progress Notes (Signed)
Occupational Therapy Treatment Patient Details  Name: Derek [name] MRN: [id number] Date of Birth: [birth year]  Today's Date: 10/30/2013 Time: 1023-1102 OT Time Calculation (min): 39 min Manual 1023-1038 (15') Therapeutic Exercises 1038-1102 (24')  Visit#: 6 of 24  Re-eval: 11/09/13    Authorization: Medicare A, [employer], and Generic Commercial  Authorization Time Period: Before 10th visit  Authorization Visit#: 6 of 10  Subjective Symptoms/Limitations Symptoms: "It doesn't hurt much unless I move it around." Limitations: Per MD Supple Protocol: Weeks 3-6 (3/12-4/2): PROM, initiate AAROM, rhythmic stabilization, isometrics, pulleys       Weeks 6-10 (4/2-4/30): AAROM, pulleys, AROM, strengthening       Weeks 9-12 (4/23-5/14) prone, progress strengthening Pain Assessment Currently in Pain?: Yes Pain Score: 2  Pain Location: Shoulder Pain Orientation: Left Pain Type: Acute pain   Exercise/Treatments Supine Protraction: PROM;10 reps;AAROM;5 reps Horizontal ABduction: PROM;10 reps;AAROM;5 reps External Rotation: PROM;10 reps;AAROM;5 reps Internal Rotation: PROM;10 reps;AAROM;5 reps Flexion: PROM;10 reps;AAROM;5 reps ABduction: PROM;10 reps;AAROM;5 reps  Therapy Ball Flexion: 20 reps ABduction: 20 reps ROM / Strengthening / Isometric Strengthening   Flexion: 5X5" Extension: 5X5" External Rotation: 5X5" Internal Rotation: 5X5" ABduction: 5X5" ADduction: 5X5"    Manual Therapy Manual Therapy: Myofascial release Myofascial Release: MFR and manual stretching to left upper arm, scapularis and trapezius region to decrease fascial restrictions and increase joint mobility in a pain free zone.    Occupational Therapy Assessment and Plan OT Assessment and Plan Clinical Impression Statement: Pt had increased tightness today and had pain with reching 90 degrees abduction and flexion. Tolerated well, but with pain, addition of few reps of AAROM in supine. OT Plan:  Continue to work on increasing PROM to [facility]. Follow up on addition of AAROM and pain - progress as tolerated.   Goals Short Term Goals Short Term Goal 1: Pt will be educated on HEP Short Term Goal 1 Progress: Progressing toward goal Short Term Goal 2: Pt will achieve PROM to [facility] in working towards improved overhead reaching Short Term Goal 2 Progress: Progressing toward goal Short Term Goal 3: Pt will have min-mod fascial restrictions in LUE to promote pain-free ROM Short Term Goal 3 Progress: Progressing toward goal Short Term Goal 4: Pt will have pain of less than 5/10 with ADL movements. Short Term Goal 4 Progress: Progressing toward goal Long Term Goals Long Term Goal 1: Pt will achieve highest level of functioning in all ADL, IADL, and leisure tasks. Long Term Goal 1 Progress: Progressing toward goal Long Term Goal 2: Pt will achieve AROM WNL in order to don pants and shirt Long Term Goal 2 Progress: Progressing toward goal Long Term Goal 3: Pt will have strength in his LUE of atleast 4+/5 in order to complete tasks necessary for woodworking tasks. Long Term Goal 3 Progress: Progressing toward goal Long Term Goal 4: Pt will have atleast min fascial restrictions in LUE. Long Term Goal 4 Progress: Progressing toward goal Long Term Goal 5: Pt will have pain of less than 2/10 during driving longer than 20 minutes. Long Term Goal 5 Progress: Progressing toward goal  Problem List Patient Active Problem List   Diagnosis Date Noted  . Pain in joint, shoulder region 10/12/2013  . Decreased range of motion of left shoulder 10/12/2013  . Muscle weakness (generalized) 10/12/2013  . Restriction of joint motion 10/12/2013  . S/P shoulder replacement 09/21/2013  . Cardiomyopathy 09/07/2013  . Anxiety 08/22/2011  . CKD (chronic kidney disease) stage 3, GFR 30-59 ml/min 08/22/2011  .  Thrombocytopenia 08/22/2011  . ARF (acute renal failure) 08/22/2011  . CORONARY ATHEROSCLEROSIS NATIVE  CORONARY ARTERY 06/25/2010  . Ascending aortic aneurysm 06/26/2009  . CROHN'S DISEASE 06/26/2009  . CHEST PAIN UNSPECIFIED 06/26/2009  . PANCREATITIS, ACUTE 01/08/2009  . SMALL BOWEL OBSTRUCTION 12/27/2008  . B12 DEFICIENCY 05/09/2008  . Gout, unspecified 05/09/2008  . Pancytopenia 05/09/2008  . POST TRAUMATIC STRESS SYNDROME 05/09/2008  . INTERNAL HEMORRHOIDS 05/09/2008  . GERD 05/09/2008  . HIATAL HERNIA 05/09/2008  . RENAL DISEASE, CHRONIC, MILD 05/09/2008  . HEPATITIS C, HX OF 05/09/2008  . PULMONARY EMBOLISM, HX OF 05/09/2008  . GASTRITIS, HX OF 05/09/2008    End of Session Activity Tolerance: Patient tolerated treatment well General Behavior During Therapy: [facility] for tasks assessed/performed  GO    Bea Graff, MS, OTR/L [phone]  10/30/2013, 11:59 AM

## 2013-11-02 ENCOUNTER — Ambulatory Visit (HOSPITAL_COMMUNITY): Payer: Medicare Other

## 2013-11-03 ENCOUNTER — Ambulatory Visit (HOSPITAL_COMMUNITY)
Admission: RE | Admit: 2013-11-03 | Discharge: 2013-11-03 | Disposition: A | Discharge status: Home / Self Care | Payer: Medicare Other | Source: Ambulatory Visit | Attending: Family Medicine | Admitting: Family Medicine

## 2013-11-03 DIAGNOSIS — N183 Chronic kidney disease, stage 3 unspecified: Secondary | ICD-10-CM | POA: Insufficient documentation

## 2013-11-03 DIAGNOSIS — M6281 Muscle weakness (generalized): Secondary | ICD-10-CM | POA: Diagnosis not present

## 2013-11-03 DIAGNOSIS — M25619 Stiffness of unspecified shoulder, not elsewhere classified: Secondary | ICD-10-CM | POA: Insufficient documentation

## 2013-11-03 DIAGNOSIS — M25519 Pain in unspecified shoulder: Secondary | ICD-10-CM | POA: Insufficient documentation

## 2013-11-03 DIAGNOSIS — IMO0001 Reserved for inherently not codable concepts without codable children: Secondary | ICD-10-CM | POA: Insufficient documentation

## 2013-11-03 DIAGNOSIS — M25612 Stiffness of left shoulder, not elsewhere classified: Secondary | ICD-10-CM
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
## 2013-11-09 ENCOUNTER — Ambulatory Visit (HOSPITAL_COMMUNITY)
Admission: RE | Admit: 2013-11-09 | Discharge: 2013-11-09 | Disposition: A | Discharge status: Home / Self Care | Payer: Medicare Other | Source: Ambulatory Visit | Attending: Family Medicine | Admitting: Family Medicine

## 2013-11-09 DIAGNOSIS — M25619 Stiffness of unspecified shoulder, not elsewhere classified: Secondary | ICD-10-CM | POA: Diagnosis not present

## 2013-11-09 DIAGNOSIS — N183 Chronic kidney disease, stage 3 unspecified: Secondary | ICD-10-CM | POA: Diagnosis not present

## 2013-11-09 DIAGNOSIS — IMO0001 Reserved for inherently not codable concepts without codable children: Secondary | ICD-10-CM | POA: Diagnosis not present

## 2013-11-09 DIAGNOSIS — M25519 Pain in unspecified shoulder: Secondary | ICD-10-CM | POA: Diagnosis not present

## 2013-11-09 DIAGNOSIS — M6281 Muscle weakness (generalized): Secondary | ICD-10-CM | POA: Diagnosis not present

## 2013-11-09 NOTE — Progress Notes (Signed)
Occupational Therapy Treatment Patient Details  Name: Derek [name] MRN: [id number] Date of Birth: 10-10-1940  Today's Date: 11/09/2013 Time: 7672-0947 OT Time Calculation (min): 47 min Manual [phone] (13') Therapeutic Exercises 900-934 (46')  Visit#: 8 of 24  Re-eval: 12/01/13    Authorization: Medicare A, [employer]anny [employer], and Generic Commercial  Authorization Time Period: Before 17th visit  Authorization Visit#: 8 of 17  Subjective Symptoms/Limitations Symptoms: "I went to the Doctor on Friday. I told him I was thinking aboutgoing back to work, my wood thing, to do what I can. Just light stuff - maybe next week, the week after." Limitations: Per MD Supple Protocol: Weeks 3-6 (3/12-4/2): PROM, initiate AAROM, rhythmic stabilization, isometrics, pulleys       Weeks 6-10 (4/2-4/30): AAROM, pulleys, AROM, strengthening       Weeks 9-12 (4/23-5/14) prone, progress strengthening Pain Assessment Currently in Pain?: Yes Pain Score: 1  Pain Location: Shoulder Pain Orientation: Left Pain Type: Acute pain  Precautions/Restrictions     Exercise/Treatments Supine Protraction: PROM;AAROM;10 reps Horizontal ABduction: PROM;AAROM;10 reps External Rotation: PROM;AAROM;10 reps Internal Rotation: PROM;AAROM;10 reps Flexion: PROM;AAROM;10 reps ABduction: PROM;AAROM;10 reps Other Supine Exercises: Elbow flexion/extension, supination/pronation, 15 reps 1# Seated Elevation: AROM;20 reps Extension: AROM;20 reps Row: AROM;20 reps Pulleys Flexion: 2 minutes ABduction: 2 minutes ROM / Strengthening / Isometric Strengthening Thumb Tacks: 1 min Prot/Ret//Elev/Dep: 10 reps      Manual Therapy Manual Therapy: Myofascial release Myofascial Release: MFR and manual stretching to left upper arm, scapularis and trapezius region to decrease fascial restrictions and increase joint mobility in a pain free zone.    Occupational Therapy Assessment and Plan OT Assessment and Plan Clinical  Impression Statement: Pt with increased pain and tightness this session from last. pt toleraeted increased reps of AAROM and pulleys, but with increased pain. Gave pt dowel rod exercsies for home use. OT Plan: P: Continue to work on increasing PROM to [facility].  AAROM seated as able. Follow up on HEP.   Goals Short Term Goals Short Term Goal 1: Pt will be educated on HEP Short Term Goal 1 Progress: Met Short Term Goal 2: Pt will achieve PROM to [facility] in working towards improved overhead reaching Short Term Goal 2 Progress: Progressing toward goal Short Term Goal 3: Pt will have min-mod fascial restrictions in LUE to promote pain-free ROM Short Term Goal 3 Progress: Met Short Term Goal 4: Pt will have pain of less than 5/10 with ADL movements. Short Term Goal 4 Progress: Met Long Term Goals Long Term Goal 1: Pt will achieve highest level of functioning in all ADL, IADL, and leisure tasks. Long Term Goal 1 Progress: Progressing toward goal Long Term Goal 2: Pt will achieve AROM WNL in order to don pants and shirt Long Term Goal 2 Progress: Progressing toward goal Long Term Goal 3: Pt will have strength in his LUE of at least 4+/5 in order to complete tasks necessary for woodworking tasks. Long Term Goal 3 Progress: Progressing toward goal Long Term Goal 4: Pt will have at least min fascial restrictions in LUE. Long Term Goal 4 Progress: Progressing toward goal Long Term Goal 5: Pt will have pain of less than 2/10 during driving longer than 20 minutes. Long Term Goal 5 Progress: Progressing toward goal  Problem List Patient Active Problem List   Diagnosis Date Noted  . Pain in joint, shoulder region 10/12/2013  . Decreased range of motion of left shoulder 10/12/2013  . Muscle weakness (generalized) 10/12/2013  . Restriction of  joint motion 10/12/2013  . S/P shoulder replacement 09/21/2013  . Cardiomyopathy 09/07/2013  . Anxiety 08/22/2011  . CKD (chronic kidney disease) stage 3, GFR 30-59  ml/min 08/22/2011  . Thrombocytopenia 08/22/2011  . ARF (acute renal failure) 08/22/2011  . CORONARY ATHEROSCLEROSIS NATIVE CORONARY ARTERY 06/25/2010  . Ascending aortic aneurysm 06/26/2009  . CROHN'S DISEASE 06/26/2009  . CHEST PAIN UNSPECIFIED 06/26/2009  . PANCREATITIS, ACUTE 01/08/2009  . SMALL BOWEL OBSTRUCTION 12/27/2008  . B12 DEFICIENCY 05/09/2008  . Gout, unspecified 05/09/2008  . Pancytopenia 05/09/2008  . POST TRAUMATIC STRESS SYNDROME 05/09/2008  . INTERNAL HEMORRHOIDS 05/09/2008  . GERD 05/09/2008  . HIATAL HERNIA 05/09/2008  . RENAL DISEASE, CHRONIC, MILD 05/09/2008  . HEPATITIS C, HX OF 05/09/2008  . PULMONARY EMBOLISM, HX OF 05/09/2008  . GASTRITIS, HX OF 05/09/2008    End of Session Activity Tolerance: Patient tolerated treatment well General Behavior During Therapy: [facility] for tasks assessed/performed OT Plan of Care OT Home Exercise Plan: Dowel exercises  OT Patient Instructions: handout given, pt verbalized understanding.  Rafael Hernandez, [place], OTR/L [phone]  11/09/2013, 12:05 PM

## 2013-11-15 ENCOUNTER — Telehealth (HOSPITAL_COMMUNITY): Payer: Self-pay

## 2013-11-15 ENCOUNTER — Ambulatory Visit (HOSPITAL_COMMUNITY): Payer: PRIVATE HEALTH INSURANCE | Admitting: Specialist

## 2013-11-17 ENCOUNTER — Ambulatory Visit (HOSPITAL_COMMUNITY)
Admission: RE | Admit: 2013-11-17 | Discharge: 2013-11-17 | Disposition: A | Discharge status: Home / Self Care | Payer: Medicare Other | Source: Ambulatory Visit | Attending: Family Medicine | Admitting: Family Medicine

## 2013-11-17 DIAGNOSIS — M25619 Stiffness of unspecified shoulder, not elsewhere classified: Secondary | ICD-10-CM | POA: Diagnosis not present

## 2013-11-17 DIAGNOSIS — R29898 Other symptoms and signs involving the musculoskeletal system: Secondary | ICD-10-CM

## 2013-11-17 DIAGNOSIS — Z96619 Presence of unspecified artificial shoulder joint: Secondary | ICD-10-CM

## 2013-11-17 DIAGNOSIS — M25519 Pain in unspecified shoulder: Secondary | ICD-10-CM | POA: Diagnosis not present

## 2013-11-17 DIAGNOSIS — M6281 Muscle weakness (generalized): Secondary | ICD-10-CM | POA: Diagnosis not present

## 2013-11-17 DIAGNOSIS — M2560 Stiffness of unspecified joint, not elsewhere classified: Secondary | ICD-10-CM

## 2013-11-17 DIAGNOSIS — IMO0001 Reserved for inherently not codable concepts without codable children: Secondary | ICD-10-CM | POA: Diagnosis not present

## 2013-11-17 DIAGNOSIS — M25612 Stiffness of left shoulder, not elsewhere classified: Secondary | ICD-10-CM

## 2013-11-17 DIAGNOSIS — N183 Chronic kidney disease, stage 3 unspecified: Secondary | ICD-10-CM | POA: Diagnosis not present

## 2013-11-17 NOTE — Progress Notes (Signed)
Occupational Therapy Treatment Patient Details  Name: Derek [name] MRN: [id number] Date of Birth: [birth year]  Today's Date: 11/17/2013 Time: 1700-1749 OT Time Calculation (min): 56 min Manual therapy [phone] 21' Therapeutic exercises [phone] 25'  Ice 10' Visit#: 9 of 24  Re-eval: 12/01/13    Authorization: Medicare A, [employer]anny [employer]ien, and Generic Commercial  Authorization Time Period: Before 17th visit  Authorization Visit#: 9 of 17  Subjective Symptoms/Limitations Symptoms: S:  I feel pretty good. Limitations: Per MD Supple Protocol: Weeks 3-6 (3/12-4/2): PROM, initiate AAROM, rhythmic stabilization, isometrics, pulleys       Weeks 6-10 (4/2-4/30): AAROM, pulleys, AROM, strengthening       Weeks 9-12 (4/23-5/14) prone, progress strengthening Pain Assessment Currently in Pain?: Yes Pain Score: 1  Pain Location: Shoulder Pain Orientation: Left Pain Type: Acute pain  Precautions/Restrictions    Per MD Supple Protocol: Weeks 3-6 (3/12-4/2): PROM, initiate AAROM, rhythmic stabilization, isometrics, pulleys       Weeks 6-10 (4/2-4/30): AAROM, pulleys, AROM, strengthening       Weeks 9-12 (4/23-5/14) prone, progress strengthening Exercise/Treatments Supine Protraction: PROM;AAROM;10 reps Horizontal ABduction: PROM;AAROM;10 reps External Rotation: PROM;AAROM;10 reps Internal Rotation: PROM;AAROM;10 reps Flexion: PROM;AAROM;10 reps ABduction: PROM;AAROM;10 reps Seated Elevation: AROM;20 reps Extension: AROM;20 reps Row: AROM;20 reps Pulleys Flexion: 2 minutes ABduction: 2 minutes Therapy Ball Flexion: 20 reps ABduction: 20 reps Isometric Strengthening   Flexion: Supine (3 X 10") External Rotation: Supine (3 X 10") Internal Rotation: Supine (3 X 10") ABduction: Supine (3 X 10" )    Modalities Modalities: Cryotherapy Manual Therapy Manual Therapy: Myofascial release Myofascial Release: Myofascial release (MFR) and manual stretching to left upper arm, scapularis  and trapezius region to decrease fascial restrictions and increase joint mobility in a pain free zone.  Cryotherapy Number Minutes Cryotherapy: 10 Minutes Cryotherapy Location: Shoulder Type of Cryotherapy: Ice pack  Occupational Therapy Assessment and Plan OT Assessment and Plan Clinical Impression Statement: A:  Patient with heightened pain above 90 degrees of flexion or abduction.  Recommended he take pain meds before coming to therapy.  added 3 X 10" isometrics for continued shoudler strengthening.  OT Plan: P:  Improve PROM by 10 degrees.  Add ball circles as able.   Goals Short Term Goals Short Term Goal 1: Pt will be educated on HEP Short Term Goal 2: Pt will achieve PROM to [facility] in working towards improved overhead reaching Short Term Goal 2 Progress: Progressing toward goal Short Term Goal 3: Pt will have min-mod fascial restrictions in LUE to promote pain-free ROM Short Term Goal 4: Pt will have pain of less than 5/10 with ADL movements. Long Term Goals Long Term Goal 1: Pt will achieve highest level of functioning in all ADL, IADL, and leisure tasks. Long Term Goal 1 Progress: Progressing toward goal Long Term Goal 2: Pt will achieve AROM WNL in order to don pants and shirt Long Term Goal 2 Progress: Progressing toward goal Long Term Goal 3: Pt will have strength in his LUE of at least 4+/5 in order to complete tasks necessary for woodworking tasks. Long Term Goal 3 Progress: Progressing toward goal Long Term Goal 4: Pt will have at least min fascial restrictions in LUE. Long Term Goal 4 Progress: Progressing toward goal Long Term Goal 5: Pt will have pain of less than 2/10 during driving longer than 20 minutes. Long Term Goal 5 Progress: Progressing toward goal  Problem List Patient Active Problem List   Diagnosis Date Noted  . Pain in joint, shoulder  region 10/12/2013  . Decreased range of motion of left shoulder 10/12/2013  . Muscle weakness (generalized) 10/12/2013   . Restriction of joint motion 10/12/2013  . S/P shoulder replacement 09/21/2013  . Cardiomyopathy 09/07/2013  . Anxiety 08/22/2011  . CKD (chronic kidney disease) stage 3, GFR 30-59 ml/min 08/22/2011  . Thrombocytopenia 08/22/2011  . ARF (acute renal failure) 08/22/2011  . CORONARY ATHEROSCLEROSIS NATIVE CORONARY ARTERY 06/25/2010  . Ascending aortic aneurysm 06/26/2009  . CROHN'S DISEASE 06/26/2009  . CHEST PAIN UNSPECIFIED 06/26/2009  . PANCREATITIS, ACUTE 01/08/2009  . SMALL BOWEL OBSTRUCTION 12/27/2008  . B12 DEFICIENCY 05/09/2008  . Gout, unspecified 05/09/2008  . Pancytopenia 05/09/2008  . POST TRAUMATIC STRESS SYNDROME 05/09/2008  . INTERNAL HEMORRHOIDS 05/09/2008  . GERD 05/09/2008  . HIATAL HERNIA 05/09/2008  . RENAL DISEASE, CHRONIC, MILD 05/09/2008  . HEPATITIS C, HX OF 05/09/2008  . PULMONARY EMBOLISM, HX OF 05/09/2008  . GASTRITIS, HX OF 05/09/2008    End of Session Activity Tolerance: Patient tolerated treatment well General Behavior During Therapy: [facility] for tasks assessed/performed  [place], OTR/L [phone]  11/17/2013, 10:17 AM

## 2013-11-20 ENCOUNTER — Ambulatory Visit (HOSPITAL_COMMUNITY)
Admission: RE | Admit: 2013-11-20 | Discharge: 2013-11-20 | Disposition: A | Discharge status: Home / Self Care | Payer: Medicare Other | Source: Ambulatory Visit | Attending: Family Medicine | Admitting: Family Medicine

## 2013-11-20 DIAGNOSIS — M6281 Muscle weakness (generalized): Secondary | ICD-10-CM | POA: Diagnosis not present

## 2013-11-20 DIAGNOSIS — N183 Chronic kidney disease, stage 3 unspecified: Secondary | ICD-10-CM | POA: Diagnosis not present

## 2013-11-20 DIAGNOSIS — M25519 Pain in unspecified shoulder: Secondary | ICD-10-CM | POA: Diagnosis not present

## 2013-11-20 DIAGNOSIS — M25619 Stiffness of unspecified shoulder, not elsewhere classified: Secondary | ICD-10-CM | POA: Diagnosis not present

## 2013-11-20 DIAGNOSIS — IMO0001 Reserved for inherently not codable concepts without codable children: Secondary | ICD-10-CM | POA: Diagnosis not present

## 2013-11-20 NOTE — Progress Notes (Addendum)
Occupational Therapy Treatment Patient Details  Name: Derek [name] MRN: [id number] Date of Birth: [birth year]  Today's Date: 11/20/2013 Time: 4782-9562 OT Time Calculation (min): 40 min Manual [phone] (15') Therapeutic Exercises [phone] (25')  Visit#: 10 of 24  Re-eval: 12/01/13    Authorization: Medicare A, [employer], and Generic Commercial  Authorization Time Period: Before 17th visit  Authorization Visit#: 10 of 17  Subjective Symptoms/Limitations Symptoms: "I took a pain pill before I left the house, before it got started." Limitations: Per MD Supple Protocol: Weeks 3-6 (3/12-4/2): PROM, initiate AAROM, rhythmic stabilization, isometrics, pulleys       Weeks 6-10 (4/2-4/30): AAROM, pulleys, AROM, strengthening       Weeks 9-12 (4/23-5/14) prone, progress strengthening Pain Assessment Currently in Pain?: No/denies  Precautions/Restrictions     Exercise/Treatments Supine Protraction: PROM;10 reps;AAROM;12 reps Horizontal ABduction: PROM;10 reps;AAROM;12 reps External Rotation: PROM;10 reps;AAROM;12 reps Internal Rotation: PROM;10 reps;AAROM;12 reps Flexion: PROM;10 reps;AAROM;12 reps ABduction: PROM;10 reps;AAROM;12 reps Pulleys Flexion: 2 minutes ABduction: 2 minutes ROM / Strengthening / Isometric Strengthening Other ROM/Strengthening Exercises: Finger ladder in flexion to top rung 5x, to number 12 in abduction with increased pain Flexion:  (3x10) Extension:  (3x20) External Rotation:  (3x10) Internal Rotation:  (3x10) ABduction:  (3x10) ADduction:  (3x10)    Manual Therapy Manual Therapy: Myofascial release Myofascial Release: Myofascial release (MFR) and manual stretching to left upper arm, scapularis and trapezius region to decrease fascial restrictions and increase joint mobility in a pain free zone.    Occupational Therapy Assessment and Plan OT Assessment and Plan Clinical Impression Statement: pt had some increased tolerance of PROM above 90  degrees flexion and abduction.  Pt did take pain meds prior to therapy this session.  Added wall walks on finger ladder witth good tolerance.  Discussed pt increaseing to 3x per week - pt is going to return to woodoworking next week - OT and pt will discuss pt's progress and 'therapy' of woodworking to determine need to increase OT sessions during week. Discussed buldling of pulley at ome for HEP use. did not add all circles doe to use of ball by toher therapist. OT Plan: Add ball circles. Re-attempt finger ladder.   Goals Short Term Goals Short Term Goal 1: Pt will be educated on HEP Short Term Goal 1 Progress: Met Short Term Goal 2: Pt will achieve PROM to [facility] in working towards improved overhead reaching Short Term Goal 2 Progress: Progressing toward goal Short Term Goal 3: Pt will have min-mod fascial restrictions in LUE to promote pain-free ROM Short Term Goal 3 Progress: Met Short Term Goal 4: Pt will have pain of less than 5/10 with ADL movements. Short Term Goal 4 Progress: Met Long Term Goals Long Term Goal 1: Pt will achieve highest level of functioning in all ADL, IADL, and leisure tasks. Long Term Goal 1 Progress: Progressing toward goal Long Term Goal 2: Pt will achieve AROM WNL in order to don pants and shirt Long Term Goal 2 Progress: Progressing toward goal Long Term Goal 3: Pt will have strength in his LUE of at least 4+/5 in order to complete tasks necessary for woodworking tasks. Long Term Goal 3 Progress: Progressing toward goal Long Term Goal 4: Pt will have at least min fascial restrictions in LUE. Long Term Goal 4 Progress: Progressing toward goal Long Term Goal 5: Pt will have pain of less than 2/10 during driving longer than 20 minutes. Long Term Goal 5 Progress: Progressing toward goal  Problem List  Patient Active Problem List   Diagnosis Date Noted  . Pain in joint, shoulder region 10/12/2013  . Decreased range of motion of left shoulder 10/12/2013  . Muscle  weakness (generalized) 10/12/2013  . Restriction of joint motion 10/12/2013  . S/P shoulder replacement 09/21/2013  . Cardiomyopathy 09/07/2013  . Anxiety 08/22/2011  . CKD (chronic kidney disease) stage 3, GFR 30-59 ml/min 08/22/2011  . Thrombocytopenia 08/22/2011  . ARF (acute renal failure) 08/22/2011  . CORONARY ATHEROSCLEROSIS NATIVE CORONARY ARTERY 06/25/2010  . Ascending aortic aneurysm 06/26/2009  . CROHN'S DISEASE 06/26/2009  . CHEST PAIN UNSPECIFIED 06/26/2009  . PANCREATITIS, ACUTE 01/08/2009  . SMALL BOWEL OBSTRUCTION 12/27/2008  . B12 DEFICIENCY 05/09/2008  . Gout, unspecified 05/09/2008  . Pancytopenia 05/09/2008  . POST TRAUMATIC STRESS SYNDROME 05/09/2008  . INTERNAL HEMORRHOIDS 05/09/2008  . GERD 05/09/2008  . HIATAL HERNIA 05/09/2008  . RENAL DISEASE, CHRONIC, MILD 05/09/2008  . HEPATITIS C, HX OF 05/09/2008  . PULMONARY EMBOLISM, HX OF 05/09/2008  . GASTRITIS, HX OF 05/09/2008    End of Session Activity Tolerance: Patient tolerated treatment well General Behavior During Therapy: [facility] for tasks assessed/performed  GO    Bea Graff, MS, OTR/L [phone]  11/20/2013, 12:03 PM

## 2013-11-24 ENCOUNTER — Ambulatory Visit (HOSPITAL_COMMUNITY)
Admission: RE | Admit: 2013-11-24 | Discharge: 2013-11-24 | Disposition: A | Discharge status: Home / Self Care | Payer: Medicare Other | Source: Ambulatory Visit | Attending: Family Medicine | Admitting: Family Medicine

## 2013-11-24 DIAGNOSIS — M25619 Stiffness of unspecified shoulder, not elsewhere classified: Secondary | ICD-10-CM | POA: Diagnosis not present

## 2013-11-24 DIAGNOSIS — N183 Chronic kidney disease, stage 3 unspecified: Secondary | ICD-10-CM | POA: Diagnosis not present

## 2013-11-24 DIAGNOSIS — M6281 Muscle weakness (generalized): Secondary | ICD-10-CM | POA: Diagnosis not present

## 2013-11-24 DIAGNOSIS — IMO0001 Reserved for inherently not codable concepts without codable children: Secondary | ICD-10-CM | POA: Diagnosis not present

## 2013-11-24 DIAGNOSIS — M25519 Pain in unspecified shoulder: Secondary | ICD-10-CM | POA: Diagnosis not present

## 2013-11-24 NOTE — Progress Notes (Signed)
Occupational Therapy Treatment Patient Details  Name: Derek [name] MRN: [id number] Date of Birth: Dec 09, 1940  Today's Date: 11/24/2013 Time: 0626-9485 OT Time Calculation (min): 46 min Manual [phone] (20') Therapeutic Exercises [id number] (45')  Visit#: 11 of 24  Re-eval: 12/01/13    Authorization: Medicare A, [employer], and Generic Commercial  Authorization Time Period: Before 17th visit  Authorization Visit#: 11 of 17  Subjective Symptoms/Limitations Symptoms: "I did a little woodworking yesterday - it worked pretty good. Stayed about 4 hours, I didnt watn to overdo it. I think I'll be alright as long as it isn't too heavy." Limitations: Per MD Supple Protocol: Weeks 3-6 (3/12-4/2): PROM, initiate AAROM, rhythmic stabilization, isometrics, pulleys       Weeks 6-10 (4/2-4/30): AAROM, pulleys, AROM, strengthening       Weeks 9-12 (4/23-5/14) prone, progress strengthening Pain Assessment Currently in Pain?: No/denies (pt took a pain pill this AM)  Exercise/Treatments Supine Protraction: PROM;10 reps;AAROM;15 reps Horizontal ABduction: PROM;10 reps;AAROM;15 reps External Rotation: PROM;10 reps;AAROM;15 reps Internal Rotation: PROM;10 reps;AAROM;15 reps Flexion: PROM;10 reps;AAROM;15 reps ABduction: PROM;10 reps;AAROM;15 reps Seated Protraction: AAROM;5 reps Horizontal ABduction: AAROM;5 reps External Rotation: AAROM;5 reps Internal Rotation: AAROM;5 reps Flexion: AAROM;5 reps Abduction: AAROM;5 reps Pulleys Flexion: 2 minutes ABduction: 2 minutes Therapy Ball Right/Left: 5 reps ROM / Strengthening / Isometric Strengthening Wall Wash: 1 min Other ROM/Strengthening Exercises: figner ladder in flexion to top rung x5 reps, in abduction to 14 x3reps and 16 x 2 reps   Manual Therapy Manual Therapy: Myofascial release Myofascial Release: Myofascial release (MFR) and manual stretching to left upper arm, scapularis and trapezius region to decrease fascial restrictions  and increase joint mobility in a pain free zone.    Occupational Therapy Assessment and Plan OT Assessment and Plan Clinical Impression Statement: Added ball circles, wall wash, and seated AAROM this session. Pt with some increased pain during reps, but was able to tolerate all new exercises well  OT Plan: Increase reps with seated AAROM - Add to HEP PRN.     Goals Short Term Goals Short Term Goal 1: Pt will be educated on HEP Short Term Goal 1 Progress: Met Short Term Goal 2: Pt will achieve PROM to [facility] in working towards improved overhead reaching Short Term Goal 2 Progress: Progressing toward goal Short Term Goal 3: Pt will have min-mod fascial restrictions in LUE to promote pain-free ROM Short Term Goal 3 Progress: Met Short Term Goal 4: Pt will have pain of less than 5/10 with ADL movements. Short Term Goal 4 Progress: Met Long Term Goals Long Term Goal 1: Pt will achieve highest level of functioning in all ADL, IADL, and leisure tasks. Long Term Goal 1 Progress: Progressing toward goal Long Term Goal 2: Pt will achieve AROM WNL in order to don pants and shirt Long Term Goal 2 Progress: Progressing toward goal Long Term Goal 3: Pt will have strength in his LUE of at least 4+/5 in order to complete tasks necessary for woodworking tasks. Long Term Goal 3 Progress: Progressing toward goal Long Term Goal 4: Pt will have at least min fascial restrictions in LUE. Long Term Goal 4 Progress: Progressing toward goal Long Term Goal 5: Pt will have pain of less than 2/10 during driving longer than 20 minutes. Long Term Goal 5 Progress: Progressing toward goal  Problem List Patient Active Problem List   Diagnosis Date Noted  . Pain in joint, shoulder region 10/12/2013  . Decreased range of motion of left shoulder 10/12/2013  .  Muscle weakness (generalized) 10/12/2013  . Restriction of joint motion 10/12/2013  . S/P shoulder replacement 09/21/2013  . Cardiomyopathy 09/07/2013  . Anxiety  08/22/2011  . CKD (chronic kidney disease) stage 3, GFR 30-59 ml/min 08/22/2011  . Thrombocytopenia 08/22/2011  . ARF (acute renal failure) 08/22/2011  . CORONARY ATHEROSCLEROSIS NATIVE CORONARY ARTERY 06/25/2010  . Ascending aortic aneurysm 06/26/2009  . CROHN'S DISEASE 06/26/2009  . CHEST PAIN UNSPECIFIED 06/26/2009  . PANCREATITIS, ACUTE 01/08/2009  . SMALL BOWEL OBSTRUCTION 12/27/2008  . B12 DEFICIENCY 05/09/2008  . Gout, unspecified 05/09/2008  . Pancytopenia 05/09/2008  . POST TRAUMATIC STRESS SYNDROME 05/09/2008  . INTERNAL HEMORRHOIDS 05/09/2008  . GERD 05/09/2008  . HIATAL HERNIA 05/09/2008  . RENAL DISEASE, CHRONIC, MILD 05/09/2008  . HEPATITIS C, HX OF 05/09/2008  . PULMONARY EMBOLISM, HX OF 05/09/2008  . GASTRITIS, HX OF 05/09/2008    End of Session Activity Tolerance: Patient tolerated treatment well General Behavior During Therapy: [facility] for tasks assessed/performed  GO   Bea Graff, MS, OTR/L [phone]  11/24/2013, 12:12 PM

## 2013-11-27 ENCOUNTER — Ambulatory Visit (HOSPITAL_COMMUNITY)
Admission: RE | Admit: 2013-11-27 | Discharge: 2013-11-27 | Disposition: A | Discharge status: Home / Self Care | Payer: Medicare Other | Source: Ambulatory Visit | Attending: Family Medicine | Admitting: Family Medicine

## 2013-11-27 DIAGNOSIS — M25619 Stiffness of unspecified shoulder, not elsewhere classified: Secondary | ICD-10-CM | POA: Diagnosis not present

## 2013-11-27 DIAGNOSIS — M25519 Pain in unspecified shoulder: Secondary | ICD-10-CM

## 2013-11-27 DIAGNOSIS — IMO0001 Reserved for inherently not codable concepts without codable children: Secondary | ICD-10-CM | POA: Diagnosis not present

## 2013-11-27 DIAGNOSIS — M6281 Muscle weakness (generalized): Secondary | ICD-10-CM

## 2013-11-27 DIAGNOSIS — M25612 Stiffness of left shoulder, not elsewhere classified: Secondary | ICD-10-CM

## 2013-11-27 DIAGNOSIS — N183 Chronic kidney disease, stage 3 unspecified: Secondary | ICD-10-CM | POA: Diagnosis not present

## 2013-11-27 NOTE — Progress Notes (Signed)
Occupational Therapy Treatment Patient Details  Name: Derek [name] MRN: [id number] Date of Birth: 1940/10/22  Today's Date: 11/27/2013 Time: 7829-5621 OT Time Calculation (min): 43 min MFR [phone] 13' Therex [phone] 30'  Visit#: 12 of    Re-eval: 12/01/13    Authorization: Medicare A, Fanny Bien, and Generic Commercial  Authorization Time Period: Before 17th visit  Authorization Visit#: 12 of 17  Subjective Symptoms/Limitations Symptoms: S: I didn't do much this weekend. My shoulder doesn't hurt right now.  Pain Assessment Currently in Pain?: No/denies  Precautions/Restrictions  Precautions Precautions: Shoulder Type of Shoulder Precautions: Per MD Supple Protocol: Weeks 3-6 (3/12-4/2): PROM, initate AAROM, rhytymic stabiliztion, isometrics, pulleys Weeks 6-10 (4/2-4/30): AAROM, pulleys, AROM, strengthening Weeks 9-12 (4/23-5/14) prone, progress strengthening  Exercise/Treatments Supine Protraction: PROM;10 reps;AAROM;15 reps Horizontal ABduction: PROM;10 reps;AAROM;15 reps External Rotation: PROM;10 reps;AAROM;15 reps Internal Rotation: PROM;10 reps;AAROM;15 reps Flexion: PROM;10 reps;AAROM;15 reps ABduction: PROM;10 reps;AAROM;15 reps Seated Protraction: AAROM;12 reps Horizontal ABduction: AAROM;12 reps External Rotation: AAROM;12 reps Internal Rotation: AAROM;12 reps Flexion: AAROM;12 reps Abduction: AAROM;12 reps Pulleys Flexion: 1 minute ABduction: 1 minute    Manual Therapy Manual Therapy: Myofascial release Myofascial Release: Myofascial release (MFR) and manual stretching to left upper arm, scapularis and trapezius region to decrease fascial restrictions and increase joint mobility in a pain free zone  Occupational Therapy Assessment and Plan OT Assessment and Plan Clinical Impression Statement: A: Patient experienced pain during manual stretching and some pulling uring AAROM seated. Increased reps with AAROM.  OT Plan: P: Continue with  exercises that were missed last session due to time constraint.    Goals Short Term Goals Short Term Goal 1: Pt will be educated on HEP Short Term Goal 2: Pt will achieve PROM to [facility] in working towards improved overhead reaching Short Term Goal 2 Progress: Progressing toward goal Short Term Goal 3: Pt will have min-mod fascial restrictions in LUE to promote pain-free ROM Short Term Goal 4: Pt will have pain of less than 5/10 with ADL movements. Long Term Goals Long Term Goal 1: Pt will achieve highest level of functioning in all ADL, IADL, and leisure tasks. Long Term Goal 1 Progress: Progressing toward goal Long Term Goal 2: Pt will achieve AROM WNL in order to don pants and shirt Long Term Goal 2 Progress: Progressing toward goal Long Term Goal 3: Pt will have strength in his LUE of at least 4+/5 in order to complete tasks necessary for woodworking tasks. Long Term Goal 3 Progress: Progressing toward goal Long Term Goal 4: Pt will have at least min fascial restrictions in LUE. Long Term Goal 4 Progress: Progressing toward goal Long Term Goal 5: Pt will have pain of less than 2/10 during driving longer than 20 minutes. Long Term Goal 5 Progress: Progressing toward goal  Problem List Patient Active Problem List   Diagnosis Date Noted  . Pain in joint, shoulder region 10/12/2013  . Decreased range of motion of left shoulder 10/12/2013  . Muscle weakness (generalized) 10/12/2013  . Restriction of joint motion 10/12/2013  . S/P shoulder replacement 09/21/2013  . Cardiomyopathy 09/07/2013  . Anxiety 08/22/2011  . CKD (chronic kidney disease) stage 3, GFR 30-59 ml/min 08/22/2011  . Thrombocytopenia 08/22/2011  . ARF (acute renal failure) 08/22/2011  . CORONARY ATHEROSCLEROSIS NATIVE CORONARY ARTERY 06/25/2010  . Ascending aortic aneurysm 06/26/2009  . CROHN'S DISEASE 06/26/2009  . CHEST PAIN UNSPECIFIED 06/26/2009  . PANCREATITIS, ACUTE 01/08/2009  . SMALL BOWEL OBSTRUCTION  12/27/2008  . B12 DEFICIENCY 05/09/2008  .  Gout, unspecified 05/09/2008  . Pancytopenia 05/09/2008  . POST TRAUMATIC STRESS SYNDROME 05/09/2008  . INTERNAL HEMORRHOIDS 05/09/2008  . GERD 05/09/2008  . HIATAL HERNIA 05/09/2008  . RENAL DISEASE, CHRONIC, MILD 05/09/2008  . HEPATITIS C, HX OF 05/09/2008  . PULMONARY EMBOLISM, HX OF 05/09/2008  . GASTRITIS, HX OF 05/09/2008    End of Session Activity Tolerance: Patient tolerated treatment well General Behavior During Therapy: [facility] for tasks assessed/performed   Ailene Ravel, OTR/L,CBIS   11/27/2013, 10:47 AM

## 2013-12-01 ENCOUNTER — Ambulatory Visit (HOSPITAL_COMMUNITY)
Admission: RE | Admit: 2013-12-01 | Discharge: 2013-12-01 | Disposition: A | Discharge status: Home / Self Care | Payer: Medicare Other | Source: Ambulatory Visit | Attending: Family Medicine | Admitting: Family Medicine

## 2013-12-01 DIAGNOSIS — M6281 Muscle weakness (generalized): Secondary | ICD-10-CM | POA: Diagnosis not present

## 2013-12-01 DIAGNOSIS — M25619 Stiffness of unspecified shoulder, not elsewhere classified: Secondary | ICD-10-CM | POA: Insufficient documentation

## 2013-12-01 DIAGNOSIS — N183 Chronic kidney disease, stage 3 unspecified: Secondary | ICD-10-CM | POA: Insufficient documentation

## 2013-12-01 DIAGNOSIS — IMO0001 Reserved for inherently not codable concepts without codable children: Secondary | ICD-10-CM | POA: Diagnosis not present

## 2013-12-01 DIAGNOSIS — M25519 Pain in unspecified shoulder: Secondary | ICD-10-CM | POA: Insufficient documentation

## 2013-12-01 DIAGNOSIS — M25612 Stiffness of left shoulder, not elsewhere classified: Secondary | ICD-10-CM
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
## 2013-12-04 ENCOUNTER — Ambulatory Visit (HOSPITAL_COMMUNITY): Payer: PRIVATE HEALTH INSURANCE

## 2013-12-06 ENCOUNTER — Ambulatory Visit (HOSPITAL_COMMUNITY): Payer: PRIVATE HEALTH INSURANCE

## 2013-12-06 DIAGNOSIS — Z96619 Presence of unspecified artificial shoulder joint: Secondary | ICD-10-CM | POA: Diagnosis not present

## 2013-12-08 ENCOUNTER — Ambulatory Visit (HOSPITAL_COMMUNITY)
Admission: RE | Admit: 2013-12-08 | Discharge: 2013-12-08 | Disposition: A | Discharge status: Home / Self Care | Payer: Medicare Other | Source: Ambulatory Visit | Attending: Family Medicine | Admitting: Family Medicine

## 2013-12-08 NOTE — Progress Notes (Signed)
Occupational Therapy Treatment Patient Details  Name: Derek [name] MRN: [id number] Date of Birth: [birth year]  Today's Date: 12/08/2013 Time: 7425-9563 OT Time Calculation (min): 40 min Manual [phone] (15') Therapeutic Exercises [phone] (25')  Visit#: 14 of 24  Re-eval: 12/29/13    Authorization: Medicare A, [employer], and Generic Commercial  Authorization Time Period: Before 23rd visit  Authorization Visit#: 14 of 23  Subjective Symptoms/Limitations Symptoms: "This is what the doctor gave me, and its not really hurting today." Limitations: Per MD Supple Protocol: Weeks 3-6 (3/12-4/2): PROM, initiate AAROM, rhythmic stabilization, isometrics, pulleys       Weeks 6-10 (4/2-4/30): AAROM, pulleys, AROM, strengthening       Weeks 9-12 (4/23-5/14) prone, progress strengthening Pain Assessment Currently in Pain?: Yes Pain Score: 1  Pain Location: Shoulder Pain Orientation: Left  Precautions/Restrictions     Exercise/Treatments Supine Protraction: PROM;AROM;10 reps Horizontal ABduction: PROM;AROM;10 reps External Rotation: PROM;AROM;10 reps Internal Rotation: PROM;AROM;10 reps Flexion: PROM;AROM;10 reps ABduction: PROM;AROM;10 reps Standing Protraction: AROM;10 reps Horizontal ABduction: AROM;10 reps External Rotation: AROM;10 reps Internal Rotation: AROM;10 reps Flexion: AROM;10 reps ABduction: AROM;10 reps     Manual Therapy Manual Therapy: Myofascial release Myofascial Release: Myofascial release (MFR) and manual stretching to left upper arm, scapularis and trapezius region to decrease fascial restrictions and increase joint mobility in a pain free zone.    Occupational Therapy Assessment and Plan OT Assessment and Plan Clinical Impression Statement: Added AROm in supine and standing this session. pt fatigued and 'felt the burn,' but tolerated well. Pt brought new order from MD for 1x per week for next 4 weeks - OTR to call MD to request 2x per week for  further therapeutic benefits for pt. OT Plan: Continue AROM in supine and standing.  Add to HEP. Add proximal shoulder strengthening.   Goals Short Term Goals Short Term Goal 1: Pt will be educated on HEP Short Term Goal 1 Progress: Met Short Term Goal 2: Pt will achieve PROM to [facility] in working towards improved overhead reaching Short Term Goal 2 Progress: Progressing toward goal Short Term Goal 3: Pt will have min-mod fascial restrictions in LUE to promote pain-free ROM Short Term Goal 3 Progress: Met Short Term Goal 4: Pt will have pain of less than 5/10 with ADL movements. Short Term Goal 4 Progress: Met Long Term Goals Long Term Goal 1: Pt will achieve highest level of functioning in all ADL, IADL, and leisure tasks. Long Term Goal 1 Progress: Progressing toward goal Long Term Goal 2: Pt will achieve AROM WNL in order to don pants and shirt Long Term Goal 2 Progress: Met Long Term Goal 3: Pt will have strength in his LUE of at least 4+/5 in order to complete tasks necessary for woodworking tasks. Long Term Goal 3 Progress: Progressing toward goal Long Term Goal 4: Pt will have at least min fascial restrictions in LUE. Long Term Goal 4 Progress: Met Long Term Goal 5: Pt will have pain of less than 2/10 during driving longer than 20 minutes. Long Term Goal 5 Progress: Met  Problem List Patient Active Problem List   Diagnosis Date Noted  . Pain in joint, shoulder region 10/12/2013  . Decreased range of motion of left shoulder 10/12/2013  . Muscle weakness (generalized) 10/12/2013  . Restriction of joint motion 10/12/2013  . S/P shoulder replacement 09/21/2013  . Cardiomyopathy 09/07/2013  . Anxiety 08/22/2011  . CKD (chronic kidney disease) stage 3, GFR 30-59 ml/min 08/22/2011  . Thrombocytopenia 08/22/2011  .  ARF (acute renal failure) 08/22/2011  . CORONARY ATHEROSCLEROSIS NATIVE CORONARY ARTERY 06/25/2010  . Ascending aortic aneurysm 06/26/2009  . CROHN'S DISEASE 06/26/2009   . CHEST PAIN UNSPECIFIED 06/26/2009  . PANCREATITIS, ACUTE 01/08/2009  . SMALL BOWEL OBSTRUCTION 12/27/2008  . B12 DEFICIENCY 05/09/2008  . Gout, unspecified 05/09/2008  . Pancytopenia 05/09/2008  . POST TRAUMATIC STRESS SYNDROME 05/09/2008  . INTERNAL HEMORRHOIDS 05/09/2008  . GERD 05/09/2008  . HIATAL HERNIA 05/09/2008  . RENAL DISEASE, CHRONIC, MILD 05/09/2008  . HEPATITIS C, HX OF 05/09/2008  . PULMONARY EMBOLISM, HX OF 05/09/2008  . GASTRITIS, HX OF 05/09/2008    End of Session Activity Tolerance: Patient tolerated treatment well General Behavior During Therapy: [facility] for tasks assessed/performed  GO    Bea Graff, MS, OTR/L [phone]  12/08/2013, 11:05 AM

## 2013-12-11 ENCOUNTER — Ambulatory Visit (HOSPITAL_COMMUNITY)
Admission: RE | Admit: 2013-12-11 | Discharge: 2013-12-11 | Disposition: A | Discharge status: Home / Self Care | Payer: Medicare Other | Source: Ambulatory Visit | Attending: Family Medicine | Admitting: Family Medicine

## 2013-12-11 NOTE — Progress Notes (Signed)
  Patient Details  Name: Derek [name] MRN: [id number] Date of Birth: 1941/03/26  Today's Date: 12/11/2013 Time:  [address] Orthopaedics - Dr. Onnie Graham gave verbal approval for extension of current order for 1x per week to 2x per week for the next month.  Bea Graff, [place], OTR/L [phone]  12/11/2013, 9:24 AM

## 2013-12-11 NOTE — Progress Notes (Signed)
Occupational Therapy Treatment Patient Details  Name: Derek [name] MRN: [id number] Date of Birth: November 13, 1940  Today's Date: 12/11/2013 Time: 9983-3825 OT Time Calculation (min): 37 min MFR [phone] 17' Therex [phone] 20'  Visit#: 15 of 24  Re-eval: 12/29/13    Authorization: Medicare A, Fanny Bien, and Generic Commercial  Authorization Time Period: Before 23rd visit  Authorization Visit#: 25 of 23  Subjective Symptoms/Limitations Symptoms: S: I fell this weekend on my left shoulder. I guess I just tripped over my feet. It swelled up yesterday but it's still sore.  Pain Assessment Currently in Pain?: No/denies  Precautions/Restrictions  Precautions Precautions: Shoulder Type of Shoulder Precautions: Per MD Supple Protocol: Weeks 3-6 (3/12-4/2): PROM, initate AAROM, rhytymic stabiliztion, isometrics, pulleys Weeks 6-10 (4/2-4/30): AAROM, pulleys, AROM, strengthening Weeks 9-12 (4/23-5/14) prone, progress strengthening  Exercise/Treatments Supine Protraction: PROM;AROM;10 reps Horizontal ABduction: PROM;AROM;10 reps External Rotation: PROM;AROM;10 reps Internal Rotation: PROM;AROM;10 reps Flexion: PROM;AROM;10 reps ABduction: PROM;AROM;10 reps Standing Protraction: AROM;10 reps Horizontal ABduction: AROM;10 reps External Rotation: AROM;10 reps Internal Rotation: AROM;10 reps Flexion: AROM;10 reps ABduction: AROM;10 reps ROM / Strengthening / Isometric Strengthening Wall Wash: 1 min Thumb Tacks: 1 min Proximal Shoulder Strengthening, Supine: 10X Proximal Shoulder Strengthening, Seated: 10X       Manual Therapy Manual Therapy: Myofascial release Myofascial Release: Myofascial release (MFR) and manual stretching to left upper arm, scapularis and trapezius region to decrease fascial restrictions and increase joint mobility in a pain free zone.  Occupational Therapy Assessment and Plan OT Assessment and Plan Clinical Impression Statement: A: Increased pain  with shoulder exercises this date. Recommended that if pain doesn't subside he should call the doctor and let him know about the fall. Added proximal shoudler strengtheing. Patient tolerated well. Added AROM exercises to HEP.  OT Plan: P: Cont. to increase shoulder stability. Add UBE.    Goals Short Term Goals Time to Complete Short Term Goals: 6 weeks Short Term Goal 1: Pt will be educated on HEP Short Term Goal 2: Pt will achieve PROM to [facility] in working towards improved overhead reaching Short Term Goal 2 Progress: Progressing toward goal Short Term Goal 3: Pt will have min-mod fascial restrictions in LUE to promote pain-free ROM Short Term Goal 4: Pt will have pain of less than 5/10 with ADL movements. Long Term Goals Time to Complete Long Term Goals: 12 weeks Long Term Goal 1: Pt will achieve highest level of functioning in all ADL, IADL, and leisure tasks. Long Term Goal 1 Progress: Progressing toward goal Long Term Goal 2: Pt will achieve AROM WNL in order to don pants and shirt Long Term Goal 3: Pt will have strength in his LUE of at least 4+/5 in order to complete tasks necessary for woodworking tasks. Long Term Goal 3 Progress: Progressing toward goal Long Term Goal 4: Pt will have at least min fascial restrictions in LUE. Long Term Goal 5: Pt will have pain of less than 2/10 during driving longer than 20 minutes.  Problem List Patient Active Problem List   Diagnosis Date Noted  . Pain in joint, shoulder region 10/12/2013  . Decreased range of motion of left shoulder 10/12/2013  . Muscle weakness (generalized) 10/12/2013  . Restriction of joint motion 10/12/2013  . S/P shoulder replacement 09/21/2013  . Cardiomyopathy 09/07/2013  . Anxiety 08/22/2011  . CKD (chronic kidney disease) stage 3, GFR 30-59 ml/min 08/22/2011  . Thrombocytopenia 08/22/2011  . ARF (acute renal failure) 08/22/2011  . CORONARY ATHEROSCLEROSIS NATIVE CORONARY ARTERY 06/25/2010  .  Ascending aortic  aneurysm 06/26/2009  . CROHN'S DISEASE 06/26/2009  . CHEST PAIN UNSPECIFIED 06/26/2009  . PANCREATITIS, ACUTE 01/08/2009  . SMALL BOWEL OBSTRUCTION 12/27/2008  . B12 DEFICIENCY 05/09/2008  . Gout, unspecified 05/09/2008  . Pancytopenia 05/09/2008  . POST TRAUMATIC STRESS SYNDROME 05/09/2008  . INTERNAL HEMORRHOIDS 05/09/2008  . GERD 05/09/2008  . HIATAL HERNIA 05/09/2008  . RENAL DISEASE, CHRONIC, MILD 05/09/2008  . HEPATITIS C, HX OF 05/09/2008  . PULMONARY EMBOLISM, HX OF 05/09/2008  . GASTRITIS, HX OF 05/09/2008    End of Session Activity Tolerance: Patient tolerated treatment well General Behavior During Therapy: [facility] for tasks assessed/performed OT Plan of Care OT Home Exercise Plan: AROM exercises OT Patient Instructions: handout (scanned) Consulted and Agree with Plan of Care: Patient   Ailene Ravel, OTR/L,CBIS   12/11/2013, 10:17 AM

## 2013-12-15 ENCOUNTER — Ambulatory Visit (HOSPITAL_COMMUNITY)
Admission: RE | Admit: 2013-12-15 | Discharge: 2013-12-15 | Disposition: A | Discharge status: Home / Self Care | Payer: Medicare Other | Source: Ambulatory Visit | Attending: Family Medicine | Admitting: Family Medicine

## 2013-12-15 NOTE — Progress Notes (Signed)
Occupational Therapy Treatment Patient Details  Name: Derek [name] MRN: [id number] Date of Birth: November 07, 1940  Today's Date: 12/15/2013 Time: 8257-4935 OT Time Calculation (min): 40 min Manual [phone] (13') Therapeutic Exercises [phone] (51')  Visit#: 16 of 24  Re-eval: 12/29/13    Authorization: Medicare A, [employer], and [employer]  Authorization Time Period: Before 23rd visit  Authorization Visit#: 16 of 23  Subjective Symptoms/Limitations Symptoms: "No much, i took a pain pill.  It was swelled up over teh weekend, but I htink its better now. Just a little sore." Pain Assessment Currently in Pain?: Yes Pain Score: 1  Pain Location: Shoulder Pain Orientation: Left Pain Type: Acute pain  Exercise/Treatments Supine Protraction: PROM;10 reps;AROM;12 reps Horizontal ABduction: PROM;10 reps;AROM;12 reps External Rotation: PROM;10 reps;AROM;12 reps Internal Rotation: PROM;10 reps;AROM;12 reps Flexion: PROM;10 reps;12 reps;AROM ABduction: PROM;10 reps;AROM;12 reps Standing Protraction: AROM;10 reps Horizontal ABduction: AROM;10 reps External Rotation: AROM;10 reps Internal Rotation: AROM;10 reps Flexion: AROM;10 reps ABduction: AROM;10 reps Extension: Theraband;12 reps Theraband Level (Shoulder Extension): Level 3 (Green) Row: Theraband;12 reps Theraband Level (Shoulder Row): Level 3 (Green) Other Standing Exercises: shoulder elevation 115 reps ROM / Strengthening / Isometric Strengthening UBE (Upper Arm Bike): 2 min forward and back at 1.0 Wall Wash: 1 min Thumb Tacks: 1 min Proximal Shoulder Strengthening, Supine: 10X Proximal Shoulder Strengthening, Seated: 10X   Manual Therapy Manual Therapy: Myofascial release Myofascial Release: Myofascial release (MFR) and manual stretching to left upper arm, scapularis and trapezius region to decrease fascial restrictions and increase joint mobility in a pain free zone.    Occupational Therapy  Assessment and Plan OT Assessment and Plan Clinical Impression Statement: pt with some pain at beginning of session with PROM, but verbalized feeling looser at end of session. Added theraband and UBE this session - pt had good toelrance with little pain with both. OT Plan: Continue to increase shoulder stability. Increase UBE resistance.  attempt ball on wall (with unweighted ball if needed)   Goals Short Term Goals Short Term Goal 1: Pt will be educated on HEP Short Term Goal 1 Progress: Met Short Term Goal 2: Pt will achieve PROM to [facility] in working towards improved overhead reaching Short Term Goal 2 Progress: Progressing toward goal Short Term Goal 3: Pt will have min-mod fascial restrictions in LUE to promote pain-free ROM Short Term Goal 3 Progress: Met Short Term Goal 4: Pt will have pain of less than 5/10 with ADL movements. Short Term Goal 4 Progress: Met Long Term Goals Long Term Goal 1: Pt will achieve highest level of functioning in all ADL, IADL, and leisure tasks. Long Term Goal 1 Progress: Progressing toward goal Long Term Goal 2: Pt will achieve AROM WNL in order to don pants and shirt Long Term Goal 2 Progress: Met Long Term Goal 3: Pt will have strength in his LUE of at least 4+/5 in order to complete tasks necessary for woodworking tasks. Long Term Goal 3 Progress: Progressing toward goal Long Term Goal 4: Pt will have at least min fascial restrictions in LUE. Long Term Goal 4 Progress: Met Long Term Goal 5: Pt will have pain of less than 2/10 during driving longer than 20 minutes. Long Term Goal 5 Progress: Met  Problem List Patient Active Problem List   Diagnosis Date Noted  . Pain in joint, shoulder region 10/12/2013  . Decreased range of motion of left shoulder 10/12/2013  . Muscle weakness (generalized) 10/12/2013  . Restriction of joint motion 10/12/2013  . S/P shoulder  replacement 09/21/2013  . Cardiomyopathy 09/07/2013  . Anxiety 08/22/2011  . CKD  (chronic kidney disease) stage 3, GFR 30-59 ml/min 08/22/2011  . Thrombocytopenia 08/22/2011  . ARF (acute renal failure) 08/22/2011  . CORONARY ATHEROSCLEROSIS NATIVE CORONARY ARTERY 06/25/2010  . Ascending aortic aneurysm 06/26/2009  . CROHN'S DISEASE 06/26/2009  . CHEST PAIN UNSPECIFIED 06/26/2009  . PANCREATITIS, ACUTE 01/08/2009  . SMALL BOWEL OBSTRUCTION 12/27/2008  . B12 DEFICIENCY 05/09/2008  . Gout, unspecified 05/09/2008  . Pancytopenia 05/09/2008  . POST TRAUMATIC STRESS SYNDROME 05/09/2008  . INTERNAL HEMORRHOIDS 05/09/2008  . GERD 05/09/2008  . HIATAL HERNIA 05/09/2008  . RENAL DISEASE, CHRONIC, MILD 05/09/2008  . HEPATITIS C, HX OF 05/09/2008  . PULMONARY EMBOLISM, HX OF 05/09/2008  . GASTRITIS, HX OF 05/09/2008    End of Session Activity Tolerance: Patient tolerated treatment well General Behavior During Therapy: [facility] for tasks assessed/performed  GO    Bea Graff, MS, OTR/L [phone]  12/15/2013, 10:22 AM

## 2013-12-18 ENCOUNTER — Ambulatory Visit (HOSPITAL_COMMUNITY)
Admission: RE | Admit: 2013-12-18 | Discharge: 2013-12-18 | Disposition: A | Discharge status: Home / Self Care | Payer: Medicare Other | Source: Ambulatory Visit | Attending: Family Medicine | Admitting: Family Medicine

## 2013-12-18 DIAGNOSIS — M25612 Stiffness of left shoulder, not elsewhere classified: Secondary | ICD-10-CM

## 2013-12-18 DIAGNOSIS — M25519 Pain in unspecified shoulder: Secondary | ICD-10-CM

## 2013-12-18 DIAGNOSIS — M6281 Muscle weakness (generalized): Secondary | ICD-10-CM

## 2013-12-18 NOTE — Progress Notes (Signed)
Occupational Therapy Treatment Patient Details  Name: Derek [name] MRN: [id number] Date of Birth: 1940-09-21  Today's Date: 12/18/2013 Time: 2458-0998 OT Time Calculation (min): 35 min Manual therapy [phone] 12' Therapeutic exercises [phone] 23'  Visit#: 17 of 24  Re-eval: 12/29/13    Authorization: Medicare A, [employer], and [employer]  Authorization Time Period: Before 23rd visit  Authorization Visit#: 17 of 23  Subjective  S:  I have the most difficulty with lateral moves Limitations: Per MD Supple Protocol: Weeks 3-6 (3/12-4/2): PROM, initiate AAROM, rhythmic stabilization, isometrics, pulleys       Weeks 6-10 (4/2-4/30): AAROM, pulleys, AROM, strengthening       Weeks 9-12 (4/23-5/14) prone, progress strengthening Pain Assessment Currently in Pain?: No/denies Pain Score: 0-No pain  Precautions/Restrictions   see protocol above  Exercise/Treatments Supine Protraction: PROM;10 reps;AROM;15 reps Horizontal ABduction: PROM;10 reps;AROM;15 reps External Rotation: PROM;10 reps;AROM;15 reps Internal Rotation: PROM;10 reps;AROM;15 reps Flexion: PROM;10 reps;AROM;15 reps ABduction: PROM;10 reps;AROM;15 reps Standing Protraction: AROM;10 reps Horizontal ABduction: AROM;10 reps External Rotation: AROM;10 reps Internal Rotation: AROM;10 reps Flexion: AROM;10 reps ABduction: AROM;10 reps ROM / Strengthening / Isometric Strengthening UBE (Upper Arm Bike): 3' forward and 3' backward 1.0 intensity Wall Wash: 2' Thumb Tacks: 1' Proximal Shoulder Strengthening, Supine: 10X Proximal Shoulder Strengthening, Seated: 10X Ball on Wall: 1' with arm flexed to 90, add abducted next visit      Manual Therapy Manual Therapy: Myofascial release Myofascial Release: Myofascial release (MFR) and manual stretching to left upper arm, scapularis and trapezius region to decrease fascial restrictions and increase joint mobility in a pain free zone.   Occupational Therapy  Assessment and Plan OT Assessment and Plan Clinical Impression Statement: A:  Pateint with WFL AROM in supine, seated however, is a bit more restricted.  Added scapular stability exercise ball on wall in flexed position, which was difficult for patient.  OT Plan: P:  attempt ball on wall in abducted position for 15 seconds and add time as able.    Goals Short Term Goals Short Term Goal 1: Pt will be educated on HEP Short Term Goal 2: Pt will achieve PROM to [facility] in working towards improved overhead reaching Short Term Goal 3: Pt will have min-mod fascial restrictions in LUE to promote pain-free ROM Short Term Goal 4: Pt will have pain of less than 5/10 with ADL movements. Long Term Goals Long Term Goal 1: Pt will achieve highest level of functioning in all ADL, IADL, and leisure tasks. Long Term Goal 2: Pt will achieve AROM WNL in order to don pants and shirt Long Term Goal 3: Pt will have strength in his LUE of at least 4+/5 in order to complete tasks necessary for woodworking tasks. Long Term Goal 4: Pt will have at least min fascial restrictions in LUE. Long Term Goal 5: Pt will have pain of less than 2/10 during driving longer than 20 minutes.  Problem List Patient Active Problem List   Diagnosis Date Noted  . Pain in joint, shoulder region 10/12/2013  . Decreased range of motion of left shoulder 10/12/2013  . Muscle weakness (generalized) 10/12/2013  . Restriction of joint motion 10/12/2013  . S/P shoulder replacement 09/21/2013  . Cardiomyopathy 09/07/2013  . Anxiety 08/22/2011  . CKD (chronic kidney disease) stage 3, GFR 30-59 ml/min 08/22/2011  . Thrombocytopenia 08/22/2011  . ARF (acute renal failure) 08/22/2011  . CORONARY ATHEROSCLEROSIS NATIVE CORONARY ARTERY 06/25/2010  . Ascending aortic aneurysm 06/26/2009  . CROHN'S DISEASE 06/26/2009  .  CHEST PAIN UNSPECIFIED 06/26/2009  . PANCREATITIS, ACUTE 01/08/2009  . SMALL BOWEL OBSTRUCTION 12/27/2008  . B12 DEFICIENCY  05/09/2008  . Gout, unspecified 05/09/2008  . Pancytopenia 05/09/2008  . POST TRAUMATIC STRESS SYNDROME 05/09/2008  . INTERNAL HEMORRHOIDS 05/09/2008  . GERD 05/09/2008  . HIATAL HERNIA 05/09/2008  . RENAL DISEASE, CHRONIC, MILD 05/09/2008  . HEPATITIS C, HX OF 05/09/2008  . PULMONARY EMBOLISM, HX OF 05/09/2008  . GASTRITIS, HX OF 05/09/2008    End of Session Activity Tolerance: Patient tolerated treatment well General Behavior During Therapy: [facility] for tasks assessed/performed  [place], OTR/L 12/18/2013, 10:08 AM

## 2013-12-22 ENCOUNTER — Ambulatory Visit (HOSPITAL_COMMUNITY): Payer: PRIVATE HEALTH INSURANCE | Admitting: Specialist

## 2013-12-26 ENCOUNTER — Ambulatory Visit (HOSPITAL_COMMUNITY): Payer: PRIVATE HEALTH INSURANCE

## 2013-12-27 DIAGNOSIS — L708 Other acne: Secondary | ICD-10-CM | POA: Diagnosis not present

## 2013-12-27 DIAGNOSIS — L719 Rosacea, unspecified: Secondary | ICD-10-CM | POA: Diagnosis not present

## 2013-12-29 ENCOUNTER — Ambulatory Visit (HOSPITAL_COMMUNITY): Payer: PRIVATE HEALTH INSURANCE

## 2014-02-06 NOTE — Progress Notes (Signed)
  Patient Details  Name: Derek [name] MRN: [id number] Date of Birth: 08/20/1940  Today's Date: 02/06/2014  The above patient was discharged from OT on 02/06/2014 secondary to failure to return to clinic since 12/18/2013.  Ailene Ravel, OTR/L,CBIS   02/06/2014, 3:52 PM

## 2014-02-06 NOTE — Addendum Note (Signed)
Encounter addended by: Debby Bud, OT on: 02/06/2014  3:53 PM<BR>     Documentation filed: Episodes, Clinical Notes, Letters

## 2014-02-19 ENCOUNTER — Other Ambulatory Visit: Payer: Self-pay | Admitting: *Deleted

## 2014-02-19 DIAGNOSIS — I712 Thoracic aortic aneurysm, without rupture, unspecified: Secondary | ICD-10-CM

## 2014-03-19 DIAGNOSIS — J069 Acute upper respiratory infection, unspecified: Secondary | ICD-10-CM | POA: Diagnosis not present

## 2014-03-19 DIAGNOSIS — J029 Acute pharyngitis, unspecified: Secondary | ICD-10-CM | POA: Diagnosis not present

## 2014-03-19 DIAGNOSIS — Z6828 Body mass index (BMI) 28.0-28.9, adult: Secondary | ICD-10-CM | POA: Diagnosis not present

## 2014-03-22 ENCOUNTER — Ambulatory Visit
Admission: RE | Admit: 2014-03-22 | Discharge: 2014-03-22 | Disposition: A | Discharge status: Home / Self Care | Payer: Medicare Other | Source: Ambulatory Visit | Attending: Cardiothoracic Surgery | Admitting: Cardiothoracic Surgery

## 2014-03-22 ENCOUNTER — Encounter: Payer: Self-pay | Admitting: Cardiothoracic Surgery

## 2014-03-22 ENCOUNTER — Ambulatory Visit (INDEPENDENT_AMBULATORY_CARE_PROVIDER_SITE_OTHER): Payer: Medicare Other | Admitting: Cardiothoracic Surgery

## 2014-03-22 VITALS — BP 116/77 | HR 60 | Ht 72.0 in | Wt 205.0 lb

## 2014-03-22 DIAGNOSIS — I712 Thoracic aortic aneurysm, without rupture, unspecified: Secondary | ICD-10-CM

## 2014-03-22 DIAGNOSIS — I7121 Aneurysm of the ascending aorta, without rupture: Secondary | ICD-10-CM

## 2014-03-22 DIAGNOSIS — I2510 Atherosclerotic heart disease of native coronary artery without angina pectoris: Secondary | ICD-10-CM

## 2014-03-22 MED ORDER — GADOBENATE DIMEGLUMINE 529 MG/ML IV SOLN
9.0000 mL | Freq: Once | INTRAVENOUS | Status: AC | PRN
  Administered 2014-03-22: 9 mL via INTRAVENOUS
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
## 2014-03-26 ENCOUNTER — Ambulatory Visit (INDEPENDENT_AMBULATORY_CARE_PROVIDER_SITE_OTHER): Payer: Medicare Other | Admitting: Cardiology

## 2014-03-26 ENCOUNTER — Other Ambulatory Visit: Payer: Self-pay | Admitting: *Deleted

## 2014-03-26 ENCOUNTER — Other Ambulatory Visit: Payer: Medicare Other

## 2014-03-26 ENCOUNTER — Encounter: Payer: Self-pay | Admitting: Cardiology

## 2014-03-26 ENCOUNTER — Encounter: Payer: Self-pay | Admitting: *Deleted

## 2014-03-26 VITALS — BP 118/73 | HR 55 | Ht 72.0 in | Wt 212.0 lb

## 2014-03-26 DIAGNOSIS — N183 Chronic kidney disease, stage 3 unspecified: Secondary | ICD-10-CM | POA: Diagnosis not present

## 2014-03-26 DIAGNOSIS — I428 Other cardiomyopathies: Secondary | ICD-10-CM | POA: Diagnosis not present

## 2014-03-26 DIAGNOSIS — I712 Thoracic aortic aneurysm, without rupture, unspecified: Secondary | ICD-10-CM | POA: Diagnosis not present

## 2014-03-26 DIAGNOSIS — I7121 Aneurysm of the ascending aorta, without rupture: Secondary | ICD-10-CM

## 2014-03-26 DIAGNOSIS — I2510 Atherosclerotic heart disease of native coronary artery without angina pectoris: Secondary | ICD-10-CM

## 2014-03-26 DIAGNOSIS — I429 Cardiomyopathy, unspecified: Secondary | ICD-10-CM

## 2014-03-26 DIAGNOSIS — I359 Nonrheumatic aortic valve disorder, unspecified: Secondary | ICD-10-CM

## 2014-03-26 NOTE — Patient Instructions (Signed)
Stop KCL (potassium).  Your physician recommends that you return for lab work today-BMET.   Your physician has requested that you have an echocardiogram. Echocardiography is a painless test that uses sound waves to create images of your heart. It provides your doctor with information about the size and shape of your heart and how well your heart's chambers and valves are working. This procedure takes approximately one hour. There are no restrictions for this procedure. February 2016  Your physician wants you to follow-up in: 1 year with Dr Aundra Dubin. (August 2016). You will receive a reminder letter in the mail two months in advance. If you don't receive a letter, please call our office to schedule the follow-up appointment.

## 2014-03-27 ENCOUNTER — Telehealth: Payer: Self-pay | Admitting: Cardiology

## 2014-03-27 LAB — BASIC METABOLIC PANEL
BUN: 14 mg/dL (ref 6–23)
CALCIUM: 8.8 mg/dL (ref 8.4–10.5)
CO2: 26 mEq/L (ref 19–32)
CREATININE: 1.6 mg/dL — AB (ref 0.4–1.5)
Chloride: 110 mEq/L (ref 96–112)
GFR: 44.81 mL/min — ABNORMAL LOW (ref >60.00)
GLUCOSE: 82 mg/dL (ref 70–99)
Potassium: 5.4 mEq/L — ABNORMAL HIGH (ref 3.5–5.1)
Sodium: 140 mEq/L (ref 135–145)

## 2014-03-27 NOTE — Progress Notes (Signed)
Patient ID: Derek [name], male   DOB: 24-Apr-1941, 73 y.o.   MRN: [id number] PCP: Dr. Hilma Favors  73 yo with history of ascending aortic and aortic root aneurysm as well as Crohns disease presents for cardiology followup. MRA chest in 2/14 showed 4.7 cm aortic root at the sinuses of Valsalva.  MRA chest 1/15 showed 5.1 cm aortic root at the sinuses of Valsalva.  Echo (1/15) showed 4.7 cm aortic root, 5.0 cm ascending aorta with mild AI. He is on a beta blocker but no ARB due to CKD.  The echo in 1/15 also showed EF decreased to 45-50% (had been normal prior).  Lexiscan Cardiolite in 2/15 done pre-op for shoulder showed EF 50%, no ischemia or infarction.  He has seen Dr Servando Snare for his aortic aneurysm and had another MRA in 8/15 with stable 5.1 cm aneurysm at the sinuses of valsalva.   Patient has been doing well in general.  He had left shoulder surgery in 0/93 without complication.  No chest pain, no exertional dyspnea.  He tries to avoid heavy lifting.   Labs (1/13): K 3.3, creatinine 1.52, plts 96,000 Labs (3/13): K 4.9, creatinine 1.99 Labs (10/13): plts 115 Labs (1/14): K 4, creatinine 1.8 Labs (2/14): LDL 61, HDL 45 Labs (4/14): plts 74,000 Labs (1/15): creatinine 1.69 Labs (2/15): K 5.8, creatinine 1.8  ECG: NSR, LVH  PMH: 1. Gout 2. Depression 3. BPH 4. Ascending aortic aneurysm: MRA chest (7/13): 4.4 cm ascending aorta at sinotubular junction.  Echo (8/13) with EF 55-60%, mild AI, aortic root 4.8 cm.  MRA chest (2/14) with 4.7 cm aortic root at the sinuses of Valsalva, 4.0 cm ascending aorta.  Aortic valve is trileaflet.  Echo (1/15) with EF 45-50%, diffuse hypokinesis, 4.7 cm aortic root and 5.0 cm ascending aorta.  MRA (1/15) with 5.1 cm aneurysm at sinuses of Valsalva, 4.2 cm ascending aortic aneurysm.  MRA (6/15) with 5.1 cm aneurysm at sinuses of Valsalva.  5. Crohns Disease: H/o SBOs.  Patient has had resection of the terminal ileum.  H/o active Crohns at the ileo-colic  anastomosis.  6. H/o PE/DVT in 2006.  7. GERD/hiatal hernia 8. PTSD 9. HCV antibody positive but viral RNA negative by PCR 10. B12 deficiency 11. Restless leg syndrome.  12. HTN 13. Nephrolithiasis.  14. CKD: followed by Dr. Jimmy Footman.  15. H/o pancreatitis from Humira 16. Chronic thrombocytopenia 17. H/o CCY 18. Per report, abnormal cardiolite in 2011.  No cath because not particularly symptomatic and has CKD.  Lexiscan Cardiolite (2/15) with EF 50%, no ischemia or infarction. 19. Abdominal CT in 5/12 showed no AAA.  20. Cardiomyopathy: EF 45-50% by echo in 1/15 (diffuse hypokinesis). EF 50% on Cardiolite 2/15.  21. OA shoulder.  22. Abdominal US (2/15): No AAA.   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   FH: Mother died from ruptured AAA, sister with SAH, father with "hole in his heart."   ROS: All systems reviewed and negative except as per HPI.   Current Outpatient Prescriptions  Medication Sig Dispense Refill  . atenolol (TENORMIN) 25 MG tablet Take 25 mg by mouth daily.        . Calcium Carbonate (CALCIUM 500 PO) Take 2 tablets by mouth 2 (two) times daily.       . cephALEXin (KEFLEX) 500 MG capsule Take 500 mg by mouth 4 (four) times daily.      . colchicine 0.6 MG tablet Take 0.6 mg  by mouth daily.        . cyanocobalamin (,VITAMIN B-12,) 1000 MCG/ML injection Inject 1,000 mcg into the muscle every 14 (fourteen) days.      . febuxostat (ULORIC) 40 MG tablet Take 80 mg by mouth daily.      . fish oil-omega-3 fatty acids 1000 MG capsule Take 1 capsule by mouth daily.       Marland Kitchen gabapentin (NEURONTIN) 100 MG capsule Take 100 mg by mouth daily. 1 tab in the am and 2 tabs in the pm      . HYDROcodone-acetaminophen (NORCO) 5-325 MG per tablet Take 1-2 tablets by mouth every 4 (four) hours as needed for moderate pain.  50 tablet  0  . HYDROmorphone (DILAUDID) 2 MG tablet Take 1-2 tablets (2-4 mg total) by mouth every 4  (four) hours as needed for severe pain.  30 tablet  0  . hydrOXYzine (VISTARIL) 25 MG capsule Take 25-50 mg by mouth 3 (three) times daily as needed for anxiety (May take 2 capsules at bedtime if needed).      . Magnesium Oxide 420 MG TABS Take 1 tablet by mouth 2 (two) times daily.       . Multiple Vitamin (MULTIVITAMIN) tablet Take 1 tablet by mouth daily.        Marland Kitchen omeprazole (PRILOSEC) 20 MG capsule Take 20 mg by mouth daily.        Marland Kitchen PARoxetine (PAXIL) 40 MG tablet Take 40 mg by mouth at bedtime.        Marland Kitchen terazosin (HYTRIN) 2 MG capsule Take 2 mg by mouth at bedtime.       . traZODone (DESYREL) 150 MG tablet Take 150 mg by mouth at bedtime.         No current facility-administered medications for this visit.    BP 118/73  Pulse 55  Ht 6' (1.829 m)  Wt 212 lb (96.163 kg)  BMI 28.75 kg/m2 General: NAD Neck: No JVD, no thyromegaly or thyroid nodule.  Lungs: Clear to auscultation bilaterally with normal respiratory effort. CV: Nondisplaced PMI.  Heart regular S1/S2, no S3/S4, no murmur.  No edema.  No carotid bruit.  Normal pedal pulses.  Abdomen: Soft, nontender, no hepatosplenomegaly, no distention.  Neurologic: Alert and oriented x 3.  Psych: Normal affect. Extremities: No clubbing or cyanosis.   Assessment/Plan: 1. Sinus of valsalva aneurysm: 5.1 cm aortic root at sinuses of Valsalva on 8/15 MRA, stable from 1/15. The aortic valve is trileaflet with mild AI. Continue atenolol.  Not on ARB because of CKD.  He has been seen by Dr Servando Snare with plan for ongoing surveillance of the ascending aorta/aortic root and surgery for dilation > 5.5 cm.   2. CKD: Stable CKD on last BMET but K was high.  Needs repeat BMET today.  3. History of thrombocytopenia: Not on ASA.  Last CBC showed mild thrombocytopenia.  I recommended that he take ASA 81 every other day.  4. Cardiomyopathy: EF 45-50% on 1/15 echo with diffuse hypokinesis, EF 50% on Cardiolite.  No evidence for ischemia or infarction on  Cardiolite.  He does not look volume overloaded on exam.  I will repeat echo in 2/16 to follow (1 year).    [name] 03/27/2014

## 2014-03-27 NOTE — Telephone Encounter (Signed)
Spoke with patient about recent lab results 

## 2014-03-27 NOTE — Telephone Encounter (Signed)
Follow up  ° ° ° °Returning call back to nurse  °

## 2014-04-20 DIAGNOSIS — N289 Disorder of kidney and ureter, unspecified: Secondary | ICD-10-CM | POA: Diagnosis not present

## 2014-04-20 DIAGNOSIS — N401 Enlarged prostate with lower urinary tract symptoms: Secondary | ICD-10-CM | POA: Diagnosis not present

## 2014-04-20 DIAGNOSIS — N183 Chronic kidney disease, stage 3 unspecified: Secondary | ICD-10-CM | POA: Diagnosis not present

## 2014-04-20 DIAGNOSIS — N200 Calculus of kidney: Secondary | ICD-10-CM | POA: Diagnosis not present

## 2014-04-30 DIAGNOSIS — N039 Chronic nephritic syndrome with unspecified morphologic changes: Secondary | ICD-10-CM | POA: Diagnosis not present

## 2014-04-30 DIAGNOSIS — E538 Deficiency of other specified B group vitamins: Secondary | ICD-10-CM | POA: Diagnosis not present

## 2014-04-30 DIAGNOSIS — N2581 Secondary hyperparathyroidism of renal origin: Secondary | ICD-10-CM | POA: Diagnosis not present

## 2014-04-30 DIAGNOSIS — D631 Anemia in chronic kidney disease: Secondary | ICD-10-CM | POA: Diagnosis not present

## 2014-04-30 DIAGNOSIS — N183 Chronic kidney disease, stage 3 unspecified: Secondary | ICD-10-CM | POA: Diagnosis not present

## 2014-04-30 DIAGNOSIS — M109 Gout, unspecified: Secondary | ICD-10-CM | POA: Diagnosis not present

## 2014-05-21 DIAGNOSIS — H109 Unspecified conjunctivitis: Secondary | ICD-10-CM | POA: Diagnosis not present

## 2014-05-21 DIAGNOSIS — H25813 Combined forms of age-related cataract, bilateral: Secondary | ICD-10-CM | POA: Diagnosis not present

## 2014-05-21 DIAGNOSIS — L719 Rosacea, unspecified: Secondary | ICD-10-CM | POA: Diagnosis not present

## 2014-08-20 DIAGNOSIS — E663 Overweight: Secondary | ICD-10-CM | POA: Diagnosis not present

## 2014-08-20 DIAGNOSIS — Z6828 Body mass index (BMI) 28.0-28.9, adult: Secondary | ICD-10-CM | POA: Diagnosis not present

## 2014-08-20 DIAGNOSIS — M1000 Idiopathic gout, unspecified site: Secondary | ICD-10-CM | POA: Diagnosis not present

## 2014-09-10 ENCOUNTER — Other Ambulatory Visit (HOSPITAL_COMMUNITY): Payer: Self-pay | Admitting: Family Medicine

## 2014-09-10 ENCOUNTER — Ambulatory Visit (HOSPITAL_COMMUNITY)
Admission: RE | Admit: 2014-09-10 | Discharge: 2014-09-10 | Disposition: A | Discharge status: Home / Self Care | Payer: Medicare Other | Source: Ambulatory Visit | Attending: Family Medicine | Admitting: Family Medicine

## 2014-09-10 DIAGNOSIS — R0789 Other chest pain: Secondary | ICD-10-CM

## 2014-09-10 DIAGNOSIS — S2231XA Fracture of one rib, right side, initial encounter for closed fracture: Secondary | ICD-10-CM | POA: Diagnosis not present

## 2014-09-10 DIAGNOSIS — S299XXA Unspecified injury of thorax, initial encounter: Secondary | ICD-10-CM | POA: Diagnosis not present

## 2014-09-10 DIAGNOSIS — W109XXA Fall (on) (from) unspecified stairs and steps, initial encounter: Secondary | ICD-10-CM | POA: Insufficient documentation

## 2014-09-10 DIAGNOSIS — Z6828 Body mass index (BMI) 28.0-28.9, adult: Secondary | ICD-10-CM | POA: Diagnosis not present

## 2014-09-10 DIAGNOSIS — E663 Overweight: Secondary | ICD-10-CM | POA: Diagnosis not present

## 2014-09-10 DIAGNOSIS — S2232XA Fracture of one rib, left side, initial encounter for closed fracture: Secondary | ICD-10-CM | POA: Insufficient documentation

## 2014-09-21 ENCOUNTER — Ambulatory Visit (HOSPITAL_COMMUNITY): Payer: Medicare Other | Attending: Cardiovascular Disease | Admitting: Radiology

## 2014-09-21 DIAGNOSIS — Z87891 Personal history of nicotine dependence: Secondary | ICD-10-CM | POA: Diagnosis not present

## 2014-09-21 DIAGNOSIS — I712 Thoracic aortic aneurysm, without rupture, unspecified: Secondary | ICD-10-CM

## 2014-09-21 DIAGNOSIS — I359 Nonrheumatic aortic valve disorder, unspecified: Secondary | ICD-10-CM | POA: Diagnosis not present

## 2014-09-21 NOTE — Progress Notes (Signed)
Echocardiogram performed.  

## 2014-09-26 DIAGNOSIS — L719 Rosacea, unspecified: Secondary | ICD-10-CM | POA: Diagnosis not present

## 2014-10-24 ENCOUNTER — Other Ambulatory Visit: Payer: Self-pay | Admitting: Dermatology

## 2014-10-24 DIAGNOSIS — L821 Other seborrheic keratosis: Secondary | ICD-10-CM | POA: Diagnosis not present

## 2014-10-24 DIAGNOSIS — L820 Inflamed seborrheic keratosis: Secondary | ICD-10-CM | POA: Diagnosis not present

## 2014-10-24 DIAGNOSIS — L708 Other acne: Secondary | ICD-10-CM | POA: Diagnosis not present

## 2015-01-28 ENCOUNTER — Other Ambulatory Visit: Payer: Self-pay

## 2015-02-08 ENCOUNTER — Other Ambulatory Visit: Payer: Self-pay | Admitting: *Deleted

## 2015-02-08 DIAGNOSIS — I712 Thoracic aortic aneurysm, without rupture, unspecified: Secondary | ICD-10-CM

## 2015-02-22 DIAGNOSIS — D3131 Benign neoplasm of right choroid: Secondary | ICD-10-CM | POA: Diagnosis not present

## 2015-02-22 DIAGNOSIS — H02402 Unspecified ptosis of left eyelid: Secondary | ICD-10-CM | POA: Diagnosis not present

## 2015-02-22 DIAGNOSIS — H25813 Combined forms of age-related cataract, bilateral: Secondary | ICD-10-CM | POA: Diagnosis not present

## 2015-03-11 DIAGNOSIS — H2512 Age-related nuclear cataract, left eye: Secondary | ICD-10-CM | POA: Diagnosis not present

## 2015-03-11 DIAGNOSIS — H25812 Combined forms of age-related cataract, left eye: Secondary | ICD-10-CM | POA: Diagnosis not present

## 2015-03-28 ENCOUNTER — Ambulatory Visit (INDEPENDENT_AMBULATORY_CARE_PROVIDER_SITE_OTHER): Payer: Medicare Other | Admitting: Cardiothoracic Surgery

## 2015-03-28 ENCOUNTER — Encounter: Payer: Self-pay | Admitting: Cardiothoracic Surgery

## 2015-03-28 ENCOUNTER — Ambulatory Visit
Admission: RE | Admit: 2015-03-28 | Discharge: 2015-03-28 | Disposition: A | Discharge status: Home / Self Care | Payer: Medicare Other | Source: Ambulatory Visit | Attending: Cardiothoracic Surgery | Admitting: Cardiothoracic Surgery

## 2015-03-28 VITALS — BP 110/70 | HR 60 | Resp 20 | Ht 72.0 in | Wt 212.0 lb

## 2015-03-28 DIAGNOSIS — I7121 Aneurysm of the ascending aorta, without rupture: Secondary | ICD-10-CM

## 2015-03-28 DIAGNOSIS — I712 Thoracic aortic aneurysm, without rupture, unspecified: Secondary | ICD-10-CM

## 2015-03-28 MED ORDER — GADOBENATE DIMEGLUMINE 529 MG/ML IV SOLN
9.0000 mL | Freq: Once | INTRAVENOUS | Status: AC | PRN
  Administered 2015-03-28: 9 mL via INTRAVENOUS
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
## 2015-04-05 DIAGNOSIS — H2511 Age-related nuclear cataract, right eye: Secondary | ICD-10-CM | POA: Diagnosis not present

## 2015-04-15 DIAGNOSIS — H2511 Age-related nuclear cataract, right eye: Secondary | ICD-10-CM | POA: Diagnosis not present

## 2015-04-15 DIAGNOSIS — H25811 Combined forms of age-related cataract, right eye: Secondary | ICD-10-CM | POA: Diagnosis not present

## 2015-04-19 ENCOUNTER — Encounter: Payer: Self-pay | Admitting: Cardiology

## 2015-04-19 ENCOUNTER — Encounter: Payer: Self-pay | Admitting: *Deleted

## 2015-04-19 ENCOUNTER — Ambulatory Visit (INDEPENDENT_AMBULATORY_CARE_PROVIDER_SITE_OTHER): Payer: Medicare Other | Admitting: Cardiology

## 2015-04-19 VITALS — BP 112/76 | HR 55 | Ht 72.0 in | Wt 208.1 lb

## 2015-04-19 DIAGNOSIS — Z0000 Encounter for general adult medical examination without abnormal findings: Secondary | ICD-10-CM | POA: Diagnosis not present

## 2015-04-19 DIAGNOSIS — I359 Nonrheumatic aortic valve disorder, unspecified: Secondary | ICD-10-CM

## 2015-04-19 DIAGNOSIS — I712 Thoracic aortic aneurysm, without rupture: Secondary | ICD-10-CM | POA: Diagnosis not present

## 2015-04-19 DIAGNOSIS — N183 Chronic kidney disease, stage 3 unspecified: Secondary | ICD-10-CM

## 2015-04-19 DIAGNOSIS — I10 Essential (primary) hypertension: Secondary | ICD-10-CM | POA: Diagnosis not present

## 2015-04-19 DIAGNOSIS — I429 Cardiomyopathy, unspecified: Secondary | ICD-10-CM

## 2015-04-19 DIAGNOSIS — I7121 Aneurysm of the ascending aorta, without rupture: Secondary | ICD-10-CM

## 2015-04-19 LAB — BASIC METABOLIC PANEL
BUN: 19 mg/dL (ref 6–23)
CHLORIDE: 110 meq/L (ref 96–112)
CO2: 25 mEq/L (ref 19–32)
CREATININE: 1.79 mg/dL — AB (ref 0.40–1.50)
Calcium: 8.9 mg/dL (ref 8.4–10.5)
GFR: 39.82 mL/min — ABNORMAL LOW (ref >60.00)
Glucose, Bld: 83 mg/dL (ref 70–99)
POTASSIUM: 5 meq/L (ref 3.5–5.1)
SODIUM: 141 meq/L (ref 135–145)

## 2015-04-19 LAB — LIPID PANEL
CHOL/HDL RATIO: 3
Cholesterol: 93 mg/dL (ref 0–200)
HDL: 31.6 mg/dL — ABNORMAL LOW (ref >39.00)
NONHDL: 61.32
TRIGLYCERIDES: 377 mg/dL — AB (ref 0.0–149.0)
VLDL: 75.4 mg/dL — ABNORMAL HIGH (ref 0.0–40.0)

## 2015-04-19 LAB — LDL CHOLESTEROL, DIRECT: LDL DIRECT: 28 mg/dL

## 2015-04-19 NOTE — Patient Instructions (Signed)
Medication Instructions:  No changes today  Labwork: Lipid /BMET today  Testing/Procedures: Your physician has requested that you have an echocardiogram. Echocardiography is a painless test that uses sound waves to create images of your heart. It provides your doctor with information about the size and shape of your heart and how well your heart's chambers and valves are working. This procedure takes approximately one hour. There are no restrictions for this procedure. February 2017    Follow-Up: Your physician wants you to follow-up in: 1 year with Dr Aundra Dubin. (September 2017). You will receive a reminder letter in the mail two months in advance. If you don't receive a letter, please call our office to schedule the follow-up appointment.

## 2015-04-21 NOTE — Progress Notes (Signed)
Patient ID: Derek [name], male   DOB: 12-30-1940, 74 y.o.   MRN: [id number] PCP: Dr. Hilma Favors  74 yo with history of ascending aortic and aortic root aneurysm as well as Crohns disease presents for cardiology followup. MRA chest in 2/14 showed 4.7 cm aortic root at the sinuses of Valsalva.  MRA chest 1/15 showed 5.1 cm aortic root at the sinuses of Valsalva.  Lexiscan Cardiolite in 2/15 done pre-op for shoulder showed EF 50%, no ischemia or infarction.  Echo (2/16) showed EF 50-55%, 5.1 cm sinus of valsalva dimension.  MRA chest 8/16 with stable 5.0 cm aneurysm at the sinuses of valsalva.    Patient has been doing well in general.  He has occasional chest tightness, usually in the evenings and not related to exertion.  No exertional dyspnea.  Weight is down 4 lbs.    Labs (1/13): K 3.3, creatinine 1.52, plts 96,000 Labs (3/13): K 4.9, creatinine 1.99 Labs (10/13): plts 115 Labs (1/14): K 4, creatinine 1.8 Labs (2/14): LDL 61, HDL 45 Labs (4/14): plts 74,000 Labs (1/15): creatinine 1.69 Labs (2/15): K 5.8, creatinine 1.8 Labs (8/15): K 5.4, creatinine 1.6  ECG: NSR, LVH, left axis deviation  PMH: 1. Gout 2. Depression 3. BPH 4. Ascending aortic aneurysm: MRA chest (7/13): 4.4 cm ascending aorta at sinotubular junction.  Echo (8/13) with EF 55-60%, mild AI, aortic root 4.8 cm.  MRA chest (2/14) with 4.7 cm aortic root at the sinuses of Valsalva, 4.0 cm ascending aorta.  Aortic valve is trileaflet.  Echo (1/15) with EF 45-50%, diffuse hypokinesis, 4.7 cm aortic root and 5.0 cm ascending aorta.  MRA (1/15) with 5.1 cm aneurysm at sinuses of Valsalva, 4.2 cm ascending aortic aneurysm.  MRA (6/15) with 5.1 cm aneurysm at sinuses of Valsalva.  MRA chest (8/16) with 5.0 cm dilation at sinuses of valsalva.  5. Crohns Disease: H/o SBOs.  Patient has had resection of the terminal ileum.  H/o active Crohns at the ileo-colic anastomosis.  6. H/o PE/DVT in 2006.  7. GERD/hiatal hernia 8. PTSD 9. HCV  antibody positive but viral RNA negative by PCR 10. B12 deficiency 11. Restless leg syndrome.  12. HTN 13. Nephrolithiasis.  14. CKD: followed by Dr. Jimmy Footman.  15. H/o pancreatitis from Humira 16. Chronic thrombocytopenia 17. H/o CCY 18. Per report, abnormal cardiolite in 2011.  No cath because not particularly symptomatic and has CKD.  Lexiscan Cardiolite (2/15) with EF 50%, no ischemia or infarction. 19. Abdominal CT in 5/12 showed no AAA.  20. Cardiomyopathy: EF 45-50% by echo in 1/15 (diffuse hypokinesis). EF 50% on Cardiolite 2/15.  Echo (2/16) with EF 50-55%, 5.1 cm dimension at sinuses of Valsalva, trileaflet aortic valve with mild to moderate AI.  21. OA shoulder.  22. Abdominal US (2/15): No AAA.   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   FH: Mother died from ruptured AAA, sister with SAH, father with "hole in his heart."   ROS: All systems reviewed and negative except as per HPI.   Current Outpatient Prescriptions  Medication Sig Dispense Refill  . atenolol (TENORMIN) 25 MG tablet Take 25 mg by mouth daily.      . Calcium Carbonate (CALCIUM 500 PO) Take 2 tablets by mouth 2 (two) times daily.     . cephALEXin (KEFLEX) 500 MG capsule Take 500 mg by mouth 4 (four) times daily.    . colchicine 0.6 MG tablet Take 0.6 mg by mouth daily.      Marland Kitchen  cyanocobalamin (,VITAMIN B-12,) 1000 MCG/ML injection Inject 1,000 mcg into the muscle every 14 (fourteen) days.    . febuxostat (ULORIC) 40 MG tablet Take 80 mg by mouth daily.    . fish oil-omega-3 fatty acids 1000 MG capsule Take 1 capsule by mouth daily.     Marland Kitchen gabapentin (NEURONTIN) 100 MG capsule Take 100 mg by mouth daily. 1 tab in the am and 2 tabs in the pm    . hydrOXYzine (VISTARIL) 25 MG capsule Take 25-50 mg by mouth 3 (three) times daily as needed for anxiety (May take 2 capsules at bedtime if needed).    . Magnesium Oxide 420 MG TABS Take 1 tablet by mouth 2 (two)  times daily.     . Multiple Vitamin (MULTIVITAMIN) tablet Take 1 tablet by mouth daily.      Marland Kitchen omeprazole (PRILOSEC) 20 MG capsule Take 20 mg by mouth daily.      Marland Kitchen PARoxetine (PAXIL) 40 MG tablet Take 40 mg by mouth at bedtime.      Marland Kitchen terazosin (HYTRIN) 2 MG capsule Take 2 mg by mouth at bedtime.     . traZODone (DESYREL) 150 MG tablet Take 150 mg by mouth at bedtime.       No current facility-administered medications for this visit.    BP 112/76 mmHg  Pulse 55  Ht 6' (1.829 m)  Wt 208 lb 1.9 oz (94.403 kg)  BMI 28.22 kg/m2 General: NAD Neck: No JVD, no thyromegaly or thyroid nodule.  Lungs: Clear to auscultation bilaterally with normal respiratory effort. CV: Nondisplaced PMI.  Heart regular S1/S2, no S3/S4, 1/6 SEM RUSB.  No edema.  No carotid bruit.  Normal pedal pulses.  Abdomen: Soft, nontender, no hepatosplenomegaly, no distention.  Neurologic: Alert and oriented x 3.  Psych: Normal affect. Extremities: No clubbing or cyanosis.   Assessment/Plan: 1. Sinus of valsalva aneurysm: 5.0 cm aortic root at sinuses of Valsalva on 8/16 MRA, stable from 8/15. The aortic valve is trileaflet with mild-moderate AI. Continue atenolol.  Not on ARB because of CKD.  He has been seen by Dr Servando Snare with plan for ongoing surveillance of the ascending aorta/aortic root and surgery for dilation > 5.5 cm.  Repeat echo in 2/16 to assess aortic insufficiency.  Repeat MRA in 8/16 to look at aortic root.  2. CKD: Stable CKD on last BMET but K was high.  Needs repeat BMET today.  3. History of thrombocytopenia: Not on ASA.  4. Cardiomyopathy: EF 45-50% on 1/15 echo with diffuse hypokinesis but 50-55% on 2/16 echo, EF 50% on Cardiolite.  No evidence for ischemia or infarction on Cardiolite.  He does not look volume overloaded on exam.   [name] 04/21/2015

## 2015-04-24 DIAGNOSIS — N200 Calculus of kidney: Secondary | ICD-10-CM | POA: Diagnosis not present

## 2015-04-24 DIAGNOSIS — N390 Urinary tract infection, site not specified: Secondary | ICD-10-CM | POA: Diagnosis not present

## 2015-04-24 DIAGNOSIS — N183 Chronic kidney disease, stage 3 (moderate): Secondary | ICD-10-CM | POA: Diagnosis not present

## 2015-04-24 DIAGNOSIS — Q6102 Congenital multiple renal cysts: Secondary | ICD-10-CM | POA: Diagnosis not present

## 2015-04-25 DIAGNOSIS — M25562 Pain in left knee: Secondary | ICD-10-CM | POA: Diagnosis not present

## 2015-05-20 DIAGNOSIS — N200 Calculus of kidney: Secondary | ICD-10-CM | POA: Diagnosis not present

## 2015-05-20 DIAGNOSIS — D631 Anemia in chronic kidney disease: Secondary | ICD-10-CM | POA: Diagnosis not present

## 2015-05-20 DIAGNOSIS — N2581 Secondary hyperparathyroidism of renal origin: Secondary | ICD-10-CM | POA: Diagnosis not present

## 2015-05-20 DIAGNOSIS — N183 Chronic kidney disease, stage 3 (moderate): Secondary | ICD-10-CM | POA: Diagnosis not present

## 2015-05-20 DIAGNOSIS — N189 Chronic kidney disease, unspecified: Secondary | ICD-10-CM | POA: Diagnosis not present

## 2015-05-20 DIAGNOSIS — M109 Gout, unspecified: Secondary | ICD-10-CM | POA: Diagnosis not present

## 2015-06-17 DIAGNOSIS — M109 Gout, unspecified: Secondary | ICD-10-CM | POA: Diagnosis not present

## 2015-06-17 DIAGNOSIS — N183 Chronic kidney disease, stage 3 (moderate): Secondary | ICD-10-CM | POA: Diagnosis not present

## 2015-07-24 DIAGNOSIS — Z1389 Encounter for screening for other disorder: Secondary | ICD-10-CM | POA: Diagnosis not present

## 2015-07-24 DIAGNOSIS — J329 Chronic sinusitis, unspecified: Secondary | ICD-10-CM | POA: Diagnosis not present

## 2015-07-24 DIAGNOSIS — J209 Acute bronchitis, unspecified: Secondary | ICD-10-CM | POA: Diagnosis not present

## 2015-07-24 DIAGNOSIS — N183 Chronic kidney disease, stage 3 (moderate): Secondary | ICD-10-CM | POA: Diagnosis not present

## 2015-07-24 DIAGNOSIS — Z6829 Body mass index (BMI) 29.0-29.9, adult: Secondary | ICD-10-CM | POA: Diagnosis not present

## 2015-07-30 DIAGNOSIS — N183 Chronic kidney disease, stage 3 (moderate): Secondary | ICD-10-CM | POA: Diagnosis not present

## 2015-08-28 ENCOUNTER — Other Ambulatory Visit: Payer: Self-pay | Admitting: Cardiothoracic Surgery

## 2015-08-28 DIAGNOSIS — I7121 Aneurysm of the ascending aorta, without rupture: Secondary | ICD-10-CM

## 2015-08-28 DIAGNOSIS — I712 Thoracic aortic aneurysm, without rupture, unspecified: Secondary | ICD-10-CM

## 2015-09-05 ENCOUNTER — Telehealth: Payer: Self-pay | Admitting: Cardiology

## 2015-09-05 NOTE — Telephone Encounter (Signed)
Pt's wife advised.

## 2015-09-05 NOTE — Telephone Encounter (Signed)
Derek [name] is calling to see if he still need to have the echo on 09/06/15 , if he is having the MRA on 09/27/15 .

## 2015-09-05 NOTE — Telephone Encounter (Signed)
Pt's wife is asking if pt needs to have both echocardiogram and MRA of the chest. I will forward to Dr Aundra Dubin for review.

## 2015-09-05 NOTE — Telephone Encounter (Signed)
He should have echo in 2/17 to followup aortic insufficiency.  Should have MRA chest in 8/17 to look at aortic root.

## 2015-09-06 ENCOUNTER — Other Ambulatory Visit: Payer: Self-pay | Admitting: Cardiothoracic Surgery

## 2015-09-06 ENCOUNTER — Other Ambulatory Visit: Payer: Self-pay

## 2015-09-06 ENCOUNTER — Ambulatory Visit (HOSPITAL_COMMUNITY): Payer: Medicare Other | Attending: Cardiology

## 2015-09-06 DIAGNOSIS — I712 Thoracic aortic aneurysm, without rupture: Secondary | ICD-10-CM

## 2015-09-06 DIAGNOSIS — I7121 Aneurysm of the ascending aorta, without rupture: Secondary | ICD-10-CM

## 2015-09-06 DIAGNOSIS — I517 Cardiomegaly: Secondary | ICD-10-CM | POA: Diagnosis not present

## 2015-09-06 DIAGNOSIS — I77810 Thoracic aortic ectasia: Secondary | ICD-10-CM | POA: Insufficient documentation

## 2015-09-06 DIAGNOSIS — N183 Chronic kidney disease, stage 3 unspecified: Secondary | ICD-10-CM

## 2015-09-06 DIAGNOSIS — I429 Cardiomyopathy, unspecified: Secondary | ICD-10-CM

## 2015-09-06 DIAGNOSIS — I351 Nonrheumatic aortic (valve) insufficiency: Secondary | ICD-10-CM | POA: Insufficient documentation

## 2015-09-06 DIAGNOSIS — Z87891 Personal history of nicotine dependence: Secondary | ICD-10-CM | POA: Insufficient documentation

## 2015-09-06 DIAGNOSIS — I359 Nonrheumatic aortic valve disorder, unspecified: Secondary | ICD-10-CM | POA: Diagnosis not present

## 2015-09-09 ENCOUNTER — Other Ambulatory Visit (HOSPITAL_COMMUNITY): Payer: Medicare Other

## 2015-09-10 ENCOUNTER — Telehealth: Payer: Self-pay | Admitting: Cardiology

## 2015-09-10 NOTE — Telephone Encounter (Signed)
Discussed recent echo results with pt.

## 2015-09-10 NOTE — Telephone Encounter (Signed)
Follow Up   Pt called request a call back to discuss results. Please call

## 2015-09-10 NOTE — Telephone Encounter (Signed)
LMTCB

## 2015-09-25 DIAGNOSIS — I712 Thoracic aortic aneurysm, without rupture: Secondary | ICD-10-CM | POA: Diagnosis not present

## 2015-09-25 LAB — CREATININE, ISTAT: Creatinine, IStat: 1.8 mg/dL — ABNORMAL HIGH (ref 0.6–1.3)

## 2015-09-26 ENCOUNTER — Ambulatory Visit: Payer: Medicare Other | Admitting: Cardiothoracic Surgery

## 2015-09-26 ENCOUNTER — Other Ambulatory Visit: Payer: Medicare Other

## 2015-09-27 ENCOUNTER — Ambulatory Visit (INDEPENDENT_AMBULATORY_CARE_PROVIDER_SITE_OTHER): Payer: Medicare Other | Admitting: Cardiothoracic Surgery

## 2015-09-27 ENCOUNTER — Ambulatory Visit
Admission: RE | Admit: 2015-09-27 | Discharge: 2015-09-27 | Disposition: A | Discharge status: Home / Self Care | Payer: Medicare Other | Source: Ambulatory Visit | Attending: Cardiothoracic Surgery | Admitting: Cardiothoracic Surgery

## 2015-09-27 ENCOUNTER — Encounter: Payer: Self-pay | Admitting: Cardiothoracic Surgery

## 2015-09-27 VITALS — BP 108/70 | HR 72 | Resp 20 | Ht 72.0 in | Wt 212.0 lb

## 2015-09-27 DIAGNOSIS — I712 Thoracic aortic aneurysm, without rupture: Secondary | ICD-10-CM

## 2015-09-27 DIAGNOSIS — I7121 Aneurysm of the ascending aorta, without rupture: Secondary | ICD-10-CM

## 2015-09-27 MED ORDER — GADOBENATE DIMEGLUMINE 529 MG/ML IV SOLN
10.0000 mL | Freq: Once | INTRAVENOUS | Status: AC | PRN
Start: 2015-09-27 — End: 2015-09-27
  Administered 2015-09-27: 10 mL via INTRAVENOUS

## 2015-09-27 NOTE — Progress Notes (Addendum)
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  ------------------------------------------------------------------- Pulmonic  valve:  Structurally normal valve.  Cusp separation was normal. Doppler: Transvalvular velocity was within the normal range. There was no regurgitation.  ------------------------------------------------------------------- Tricuspid valve:  Structurally normal valve.  Leaflet separation was normal. Doppler: Transvalvular velocity was within the normal range. There was no regurgitation.  ------------------------------------------------------------------- Right atrium: The atrium was normal in size.  ------------------------------------------------------------------- Pericardium: There was no pericardial effusion.    Assessment / Plan:   Dilated Aortic root ascending aorta without history of Bicuspid aortic valve  or Marfan's or associated genetic disease, no family history of Aortic dissection or sudden unexplained death at young age.(sister with brain aneurysm and mother ruptured AAA). There has been very slow enlargement of aortic root over the past 9 years that patient has been followed. Patient should continue on beta blocker, BP control (which appears good ) good dental care (decrease risk of endocarditis). Consider elective root replacement at 5.5 cm. The dx and radiographic findings have been discussed with the patient and wife in detail.    Aortic Size Index=      5.0   /Body surface area is 2.13 meters squared. =2.34  < 2.75 cm/m2      4% risk per year 2.75 to 4.25          8% risk per year > 4.25 cm/m2    20% risk per year  Chronic Kidney Disease  Cr 1.79    The natural history of dilated ascending aorta is discussed again with the patient and his wife in detail. We reviewed the signs and symptoms of aortic  dissection. I specifically cautioned him about doing any kind of heavy weight lifting as this is contraindicated in anybody with dilated aorta.  I will plan to see patient back in 6  months with follow up MRA of chest ( instead of CT due to Stage IIIa CKD)  Grace Isaac MD      [address] [place] [address] Office [phone]   Beeper [phone]

## 2015-10-07 DIAGNOSIS — N200 Calculus of kidney: Secondary | ICD-10-CM | POA: Diagnosis not present

## 2015-10-07 DIAGNOSIS — K5090 Crohn's disease, unspecified, without complications: Secondary | ICD-10-CM | POA: Diagnosis not present

## 2015-10-07 DIAGNOSIS — F4310 Post-traumatic stress disorder, unspecified: Secondary | ICD-10-CM | POA: Diagnosis not present

## 2015-10-07 DIAGNOSIS — I719 Aortic aneurysm of unspecified site, without rupture: Secondary | ICD-10-CM | POA: Diagnosis not present

## 2015-10-07 DIAGNOSIS — M109 Gout, unspecified: Secondary | ICD-10-CM | POA: Diagnosis not present

## 2015-10-07 DIAGNOSIS — N2581 Secondary hyperparathyroidism of renal origin: Secondary | ICD-10-CM | POA: Diagnosis not present

## 2015-10-07 DIAGNOSIS — D631 Anemia in chronic kidney disease: Secondary | ICD-10-CM | POA: Diagnosis not present

## 2015-10-07 DIAGNOSIS — N183 Chronic kidney disease, stage 3 (moderate): Secondary | ICD-10-CM | POA: Diagnosis not present

## 2015-10-07 DIAGNOSIS — E538 Deficiency of other specified B group vitamins: Secondary | ICD-10-CM | POA: Diagnosis not present

## 2015-10-07 DIAGNOSIS — Z86711 Personal history of pulmonary embolism: Secondary | ICD-10-CM | POA: Diagnosis not present

## 2015-10-07 DIAGNOSIS — M1990 Unspecified osteoarthritis, unspecified site: Secondary | ICD-10-CM | POA: Diagnosis not present

## 2015-10-21 ENCOUNTER — Telehealth: Payer: Self-pay | Admitting: Internal Medicine

## 2015-10-21 NOTE — Telephone Encounter (Signed)
Please advise Sir, thank you. 

## 2015-10-22 NOTE — Telephone Encounter (Signed)
Left detailed message for patient regarding Dr Celesta Aver advice.  Told them to call back if they need an appointment set up.

## 2015-10-22 NOTE — Telephone Encounter (Signed)
Thank them for the confidence in me but my practice is so busy I would recommend he see Dr. Havery Moros please. If unhappy with him (they won't be) I would reconsider.

## 2015-10-23 ENCOUNTER — Telehealth: Payer: Self-pay | Admitting: Internal Medicine

## 2015-10-23 ENCOUNTER — Encounter: Payer: Self-pay | Admitting: Internal Medicine

## 2015-10-24 NOTE — Telephone Encounter (Signed)
Left patient another detailed message , see phone note dated 10/22/15, and told them to call back if they need an appointment now.

## 2015-12-19 ENCOUNTER — Encounter: Payer: Self-pay | Admitting: Internal Medicine

## 2015-12-26 DIAGNOSIS — Z1389 Encounter for screening for other disorder: Secondary | ICD-10-CM | POA: Diagnosis not present

## 2015-12-26 DIAGNOSIS — J018 Other acute sinusitis: Secondary | ICD-10-CM | POA: Diagnosis not present

## 2015-12-26 DIAGNOSIS — J209 Acute bronchitis, unspecified: Secondary | ICD-10-CM | POA: Diagnosis not present

## 2015-12-26 DIAGNOSIS — N400 Enlarged prostate without lower urinary tract symptoms: Secondary | ICD-10-CM | POA: Diagnosis not present

## 2015-12-26 DIAGNOSIS — N184 Chronic kidney disease, stage 4 (severe): Secondary | ICD-10-CM | POA: Diagnosis not present

## 2015-12-26 DIAGNOSIS — Z6829 Body mass index (BMI) 29.0-29.9, adult: Secondary | ICD-10-CM | POA: Diagnosis not present

## 2016-01-15 DIAGNOSIS — L820 Inflamed seborrheic keratosis: Secondary | ICD-10-CM | POA: Diagnosis not present

## 2016-01-15 DIAGNOSIS — D225 Melanocytic nevi of trunk: Secondary | ICD-10-CM | POA: Diagnosis not present

## 2016-01-15 DIAGNOSIS — D485 Neoplasm of uncertain behavior of skin: Secondary | ICD-10-CM | POA: Diagnosis not present

## 2016-01-15 DIAGNOSIS — L814 Other melanin hyperpigmentation: Secondary | ICD-10-CM | POA: Diagnosis not present

## 2016-01-27 DIAGNOSIS — E781 Pure hyperglyceridemia: Secondary | ICD-10-CM | POA: Diagnosis not present

## 2016-01-27 DIAGNOSIS — N184 Chronic kidney disease, stage 4 (severe): Secondary | ICD-10-CM | POA: Diagnosis not present

## 2016-01-27 DIAGNOSIS — Z0000 Encounter for general adult medical examination without abnormal findings: Secondary | ICD-10-CM | POA: Diagnosis not present

## 2016-01-27 DIAGNOSIS — E663 Overweight: Secondary | ICD-10-CM | POA: Diagnosis not present

## 2016-01-27 DIAGNOSIS — N400 Enlarged prostate without lower urinary tract symptoms: Secondary | ICD-10-CM | POA: Diagnosis not present

## 2016-01-27 DIAGNOSIS — D61818 Other pancytopenia: Secondary | ICD-10-CM | POA: Diagnosis not present

## 2016-01-27 DIAGNOSIS — Z6828 Body mass index (BMI) 28.0-28.9, adult: Secondary | ICD-10-CM | POA: Diagnosis not present

## 2016-01-27 DIAGNOSIS — K5180 Other ulcerative colitis without complications: Secondary | ICD-10-CM | POA: Diagnosis not present

## 2016-01-28 DIAGNOSIS — Z0000 Encounter for general adult medical examination without abnormal findings: Secondary | ICD-10-CM | POA: Diagnosis not present

## 2016-01-28 DIAGNOSIS — K5180 Other ulcerative colitis without complications: Secondary | ICD-10-CM | POA: Diagnosis not present

## 2016-01-28 DIAGNOSIS — Z125 Encounter for screening for malignant neoplasm of prostate: Secondary | ICD-10-CM | POA: Diagnosis not present

## 2016-01-28 DIAGNOSIS — D61818 Other pancytopenia: Secondary | ICD-10-CM | POA: Diagnosis not present

## 2016-01-28 DIAGNOSIS — E781 Pure hyperglyceridemia: Secondary | ICD-10-CM | POA: Diagnosis not present

## 2016-02-13 ENCOUNTER — Other Ambulatory Visit: Payer: Self-pay | Admitting: *Deleted

## 2016-02-13 DIAGNOSIS — I7121 Aneurysm of the ascending aorta, without rupture: Secondary | ICD-10-CM

## 2016-02-13 DIAGNOSIS — I712 Thoracic aortic aneurysm, without rupture: Secondary | ICD-10-CM

## 2016-02-21 ENCOUNTER — Ambulatory Visit (INDEPENDENT_AMBULATORY_CARE_PROVIDER_SITE_OTHER): Payer: Medicare Other | Admitting: Internal Medicine

## 2016-02-21 ENCOUNTER — Encounter: Payer: Self-pay | Admitting: Internal Medicine

## 2016-02-21 VITALS — BP 96/68 | HR 64 | Ht 70.0 in | Wt 206.0 lb

## 2016-02-21 DIAGNOSIS — Z23 Encounter for immunization: Secondary | ICD-10-CM

## 2016-02-21 DIAGNOSIS — K50812 Crohn's disease of both small and large intestine with intestinal obstruction: Secondary | ICD-10-CM

## 2016-02-21 DIAGNOSIS — K5660 Unspecified intestinal obstruction: Secondary | ICD-10-CM

## 2016-02-21 DIAGNOSIS — R82992 Hyperoxaluria: Secondary | ICD-10-CM

## 2016-02-21 DIAGNOSIS — E748 Other specified disorders of carbohydrate metabolism: Secondary | ICD-10-CM | POA: Diagnosis not present

## 2016-02-21 DIAGNOSIS — K56699 Other intestinal obstruction unspecified as to partial versus complete obstruction: Secondary | ICD-10-CM

## 2016-02-21 DIAGNOSIS — N2581 Secondary hyperparathyroidism of renal origin: Secondary | ICD-10-CM

## 2016-02-21 HISTORY — DX: Hyperoxaluria: R82.992

## 2016-02-21 HISTORY — DX: Secondary hyperparathyroidism of renal origin: N25.81

## 2016-02-21 NOTE — Patient Instructions (Signed)
You have been scheduled for a colonoscopy. Please follow written instructions given to you at your visit today.  Please pick up your prep supplies at the pharmacy within the next 1-3 days. If you use inhalers (even only as needed), please bring them with you on the day of your procedure.   Today you are being given a Prevnar 13 vaccine and information sheet to read.    I appreciate the opportunity to care for you. Silvano Rusk, MD, [facility]
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
## 2016-02-24 DIAGNOSIS — M545 Low back pain: Secondary | ICD-10-CM | POA: Diagnosis not present

## 2016-03-11 ENCOUNTER — Ambulatory Visit
Admission: RE | Admit: 2016-03-11 | Discharge: 2016-03-11 | Disposition: A | Discharge status: Home / Self Care | Payer: Medicare Other | Source: Ambulatory Visit | Attending: Cardiothoracic Surgery | Admitting: Cardiothoracic Surgery

## 2016-03-11 DIAGNOSIS — I7121 Aneurysm of the ascending aorta, without rupture: Secondary | ICD-10-CM

## 2016-03-11 DIAGNOSIS — I712 Thoracic aortic aneurysm, without rupture: Secondary | ICD-10-CM | POA: Diagnosis not present

## 2016-03-11 MED ORDER — GADOBENATE DIMEGLUMINE 529 MG/ML IV SOLN
10.0000 mL | Freq: Once | INTRAVENOUS | Status: AC | PRN
  Administered 2016-03-11: 10 mL via INTRAVENOUS

## 2016-03-13 DIAGNOSIS — M5136 Other intervertebral disc degeneration, lumbar region: Secondary | ICD-10-CM | POA: Diagnosis not present

## 2016-03-13 DIAGNOSIS — M5137 Other intervertebral disc degeneration, lumbosacral region: Secondary | ICD-10-CM | POA: Diagnosis not present

## 2016-03-13 DIAGNOSIS — M545 Low back pain: Secondary | ICD-10-CM | POA: Diagnosis not present

## 2016-03-18 ENCOUNTER — Ambulatory Visit (AMBULATORY_SURGERY_CENTER): Payer: Medicare Other | Admitting: Internal Medicine

## 2016-03-18 ENCOUNTER — Encounter: Payer: Self-pay | Admitting: Internal Medicine

## 2016-03-18 VITALS — BP 95/55 | HR 58 | Temp 97.8°F | Resp 10 | Ht 70.0 in | Wt 206.0 lb

## 2016-03-18 DIAGNOSIS — B1920 Unspecified viral hepatitis C without hepatic coma: Secondary | ICD-10-CM | POA: Diagnosis not present

## 2016-03-18 DIAGNOSIS — K50819 Crohn's disease of both small and large intestine with unspecified complications: Secondary | ICD-10-CM

## 2016-03-18 DIAGNOSIS — K639 Disease of intestine, unspecified: Secondary | ICD-10-CM

## 2016-03-18 DIAGNOSIS — K56699 Other intestinal obstruction unspecified as to partial versus complete obstruction: Secondary | ICD-10-CM

## 2016-03-18 DIAGNOSIS — I2510 Atherosclerotic heart disease of native coronary artery without angina pectoris: Secondary | ICD-10-CM | POA: Diagnosis not present

## 2016-03-18 DIAGNOSIS — K5090 Crohn's disease, unspecified, without complications: Secondary | ICD-10-CM | POA: Diagnosis not present

## 2016-03-18 DIAGNOSIS — F4310 Post-traumatic stress disorder, unspecified: Secondary | ICD-10-CM | POA: Diagnosis not present

## 2016-03-18 DIAGNOSIS — I10 Essential (primary) hypertension: Secondary | ICD-10-CM | POA: Diagnosis not present

## 2016-03-18 DIAGNOSIS — D123 Benign neoplasm of transverse colon: Secondary | ICD-10-CM | POA: Diagnosis not present

## 2016-03-18 DIAGNOSIS — K219 Gastro-esophageal reflux disease without esophagitis: Secondary | ICD-10-CM | POA: Diagnosis not present

## 2016-03-18 DIAGNOSIS — K5660 Unspecified intestinal obstruction: Secondary | ICD-10-CM | POA: Diagnosis present

## 2016-03-18 MED ORDER — SODIUM CHLORIDE 0.9 % IV SOLN: 500.0000 mL | INTRAVENOUS | Status: DC

## 2016-03-18 NOTE — Patient Instructions (Addendum)
The stricture is open - no need to dilate. I found a small polyp and removed it (from colon). I also found another abnormality - probably a polyp but not certain so biopsied it and marked the area with tattoos.  I will let you know results and plans.  I appreciate the opportunity to care for you. [name], MD, FACG YOU HAD AN ENDOSCOPIC PROCEDURE TODAY AT [facility]:   Refer to the procedure report that was given to you for any specific questions about what was found during the examination.  If the procedure report does not answer your questions, please call your gastroenterologist to clarify.  If you requested that your care partner not be given the details of your procedure findings, then the procedure report has been included in a sealed envelope for you to review at your convenience later.  YOU SHOULD EXPECT: Some feelings of bloating in the abdomen. Passage of more gas than usual.  Walking can help get rid of the air that was put into your GI tract during the procedure and reduce the bloating. If you had a lower endoscopy (such as a colonoscopy or flexible sigmoidoscopy) you may notice spotting of blood in your stool or on the toilet paper. If you underwent a bowel prep for your procedure, you may not have a normal bowel movement for a few days.  Please Note:  You might notice some irritation and congestion in your nose or some drainage.  This is from the oxygen used during your procedure.  There is no need for concern and it should clear up in a day or so.  SYMPTOMS TO REPORT IMMEDIATELY:   Following lower endoscopy (colonoscopy or flexible sigmoidoscopy):  Excessive amounts of blood in the stool  Significant tenderness or worsening of abdominal pains  Swelling of the abdomen that is new, acute  Fever of 100F or higher   For urgent or emergent issues, a gastroenterologist can be reached at any hour by calling [phone].   DIET:  We do recommend  a small meal at first, but then you may proceed to your regular diet.  Drink plenty of fluids but you should avoid alcoholic beverages for 24 hours.  ACTIVITY:  You should plan to take it easy for the rest of today and you should NOT DRIVE or use heavy machinery until tomorrow (because of the sedation medicines used during the test).    FOLLOW UP: Our staff will call the number listed on your records the next business day following your procedure to check on you and address any questions or concerns that you may have regarding the information given to you following your procedure. If we do not reach you, we will leave a message.  However, if you are feeling well and you are not experiencing any problems, there is no need to return our call.  We will assume that you have returned to your regular daily activities without incident.  If any biopsies were taken you will be contacted by phone or by letter within the next 1-3 weeks.  Please call us at [phone] if you have not heard about the biopsies in 3 weeks.    SIGNATURES/CONFIDENTIALITY: You and/or your care partner have signed paperwork which will be entered into your electronic medical record.  These signatures attest to the fact that that the information above on your After Visit Summary has been reviewed and is understood.  Full responsibility of the confidentiality of this  discharge information lies with you and/or your care-partner.  Polyps handout provided. Continue present medications.

## 2016-03-18 NOTE — Progress Notes (Signed)
Report to PACU, RN, vss, BBS= Clear.  

## 2016-03-18 NOTE — Progress Notes (Signed)
Called to room to assist during endoscopic procedure.  Patient ID and intended procedure confirmed with present staff. Received instructions for my participation in the procedure from the performing physician.  

## 2016-03-18 NOTE — Op Note (Signed)
[place] Patient Name: Dara Camargo Procedure Date: 03/18/2016 3:20 PM MRN: [id number] Endoscopist: [name] , MD Age: 75 Referring MD:  Date of Birth: 1940-11-23 Gender: Male Account #: [id number] Procedure:                Colonoscopy Indications:              Crohn's disease of the small bowel, Follow-up of                            Crohn's disease of the small bowel, For therapy of                            Crohn's disease of the small bowel and colon Medicines:                Propofol per Anesthesia, Monitored Anesthesia Care Procedure:                Pre-Anesthesia Assessment:                           - Prior to the procedure, a History and Physical                            was performed, and patient medications and                            allergies were reviewed. The patient's tolerance of                            previous anesthesia was also reviewed. The risks                            and benefits of the procedure and the sedation                            options and risks were discussed with the patient.                            All questions were answered, and informed consent                            was obtained. Prior Anticoagulants: The patient has                            taken no previous anticoagulant or antiplatelet                            agents. ASA Grade Assessment: II - A patient with                            mild systemic disease. After reviewing the risks                            and benefits, the patient was deemed in  satisfactory condition to undergo the procedure.                           After obtaining informed consent, the colonoscope                            was passed under direct vision. Throughout the                            procedure, the patient's blood pressure, pulse, and                            oxygen saturations were monitored continuously. The             Model PCF-H190L [id number]) scope was introduced                            through the anus and advanced to the 20 cm into the                            ileum. The colonoscopy was performed without                            difficulty. The patient tolerated the procedure                            well. The quality of the bowel preparation was                            good. The bowel preparation used was Miralax. The                            terminal ileum, the rectum and Ileocolonic                            anastomsis areas were photographed. Scope In: 3:46:29 PM Scope Out: 4:02:53 PM Scope Withdrawal Time: 0 hours 14 minutes 6 seconds  Total Procedure Duration: 0 hours 16 minutes 24 seconds  Findings:                 The perianal and digital rectal examinations were                            normal. Pertinent negatives include normal prostate                            (size, shape, and consistency).                           There was evidence of a prior functional end-to-end                            ileo-colonic anastomosis in the ascending colon.  This was patent and was characterized by mild                            inflammation. The anastomosis was traversed.                           The neo-terminal ileum appeared normal.                           A 3 mm polyp was found in the mid transverse colon.                            The polyp was sessile. The polyp was removed with a                            cold snare. Resection and retrieval were complete.                            Verification of patient identification for the                            specimen was done. Estimated blood loss was minimal.                           An 18 mm polypoid lesion was found in the distal                            transverse colon. The lesion was sessile. No                            bleeding was present. Biopsies were taken with a                             cold forceps for histology. Verification of patient                            identification for the specimen was done. Estimated                            blood loss was minimal. Area was tattooed with an                            injection of 2 mL of Spot (carbon black).                           The exam was otherwise without abnormality on                            direct and retroflexion views. Complications:            No immediate complications. Estimated Blood Loss:     Estimated blood loss was minimal. Impression:               - Patent  functional end-to-end ileo-colonic                            anastomosis, characterized by inflammation.                           - The examined portion of the ileum was normal.                           - One 3 mm polyp in the mid transverse colon,                            removed with a cold snare. Resected and retrieved.                           - Polypoid lesion in the distal transverse colon.                            Biopsied. Tattooed. Slightly firm in depressed                            center - ? polyp vs advanced neoplasia/worse                           - The examination was otherwise normal on direct                            and retroflexion views. Recommendation:           - Patient has a contact number available for                            emergencies. The signs and symptoms of potential                            delayed complications were discussed with the                            patient. Return to normal activities tomorrow.                            Written discharge instructions were provided to the                            patient.                           - Resume previous diet.                           - Continue present medications.                           - Await pathology results.                           -  Repeat colonoscopy is recommended. The                             colonoscopy date will be determined after pathology                            results from today's exam become available for                            review.                           - Stricture at ileo-colonic anastomosis is                            minimally inflamed and patent so no dilation and                            observation is planned (no medical Tx) [name], MD 03/18/2016 4:14:32 PM This report has been signed electronically.

## 2016-03-19 ENCOUNTER — Ambulatory Visit (INDEPENDENT_AMBULATORY_CARE_PROVIDER_SITE_OTHER): Payer: Medicare Other | Admitting: Cardiothoracic Surgery

## 2016-03-19 ENCOUNTER — Telehealth: Payer: Self-pay

## 2016-03-19 ENCOUNTER — Encounter: Payer: Self-pay | Admitting: Cardiothoracic Surgery

## 2016-03-19 VITALS — BP 106/73 | HR 59 | Resp 16 | Ht 70.0 in | Wt 206.0 lb

## 2016-03-19 DIAGNOSIS — I712 Thoracic aortic aneurysm, without rupture: Secondary | ICD-10-CM

## 2016-03-19 DIAGNOSIS — I7121 Aneurysm of the ascending aorta, without rupture: Secondary | ICD-10-CM

## 2016-03-19 NOTE — Progress Notes (Signed)
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  ------------------------------------------------------------------- History:  PMH: Acquired from the patient and from the patient&'s chart. Mild aortic regurgitation. Deep vein thrombosis. Pulmonary embolic disease. Risk factors: Former tobacco use.  ------------------------------------------------------------------- Study Conclusions  - Left ventricle: The cavity size was normal. Wall thickness was normal. Systolic function was normal. The estimated ejection fraction was in the range of 50% to 55%. Doppler parameters are consistent with abnormal left ventricular relaxation (grade 1 diastolic dysfunction). - Aortic valve: Trileaflet. There was mild to moderate regurgitation directed centrally in the LVOT. - Aortic root: The aortic root was moderately dilated. Sinuses of Valsalva 51 mm, mid ascending aorta 43 mm.  ------------------------------------------------------------------- Labs, prior tests, procedures, and surgery: Echocardiography (January 2015).   EF was 50%. Ascending aorta 50 mm. Aortic root 47 mm.  Magnetic resonance angiography (January 2015).  The study demonstrated an aneurysm. Sinus of valsalva 79m, 42 sino-tubular junction, and ascending aorta 42 mm. Transthoracic echocardiography. M-mode, complete 2D, spectral Doppler, and color Doppler. Birthdate: Patient birthdate: 012-01-1941 Age: Patient is 75yr old. Sex: Gender: male. BMI: 28.8 kg/m^2. Blood pressure:   118/73 Patient status: Outpatient. Study date: Study date: 09/21/2014. Study time: 10:39 AM. Location: [facility]  -------------------------------------------------------------------  ------------------------------------------------------------------- Left ventricle: The cavity size was normal. Wall thickness  was normal. Systolic function was normal. The estimated ejection fraction was in the range of 50% to 55%. Doppler parameters are consistent with abnormal left ventricular relaxation (grade 1 diastolic dysfunction). There was no evidence of elevated ventricular filling pressure by Doppler parameters.  ------------------------------------------------------------------- Aortic valve:  Trileaflet. Doppler:  There was no stenosis. There was mild to moderate regurgitation directed centrally in the LVOT.  ------------------------------------------------------------------- Aorta: Aortic root: The aortic root was moderately dilated. Sinuses of Valsalva 51 mm, mid ascending aorta 43 mm.  ------------------------------------------------------------------- Mitral valve:  Structurally normal valve.  Leaflet separation was normal. Doppler: Transvalvular velocity was within the normal range. There was no evidence for stenosis. There was no regurgitation.  ------------------------------------------------------------------- Left atrium: The atrium was normal in size.  ------------------------------------------------------------------- Right ventricle: The cavity size was normal. Wall thickness was normal. Systolic function was normal.  ------------------------------------------------------------------- Pulmonic valve:  Structurally normal valve.  Cusp separation was normal. Doppler: Transvalvular velocity was within the normal range. There was no regurgitation.  ------------------------------------------------------------------- Tricuspid valve:  Structurally normal valve.  Leaflet separation was normal. Doppler: Transvalvular velocity was within the normal range. There was no regurgitation.  ------------------------------------------------------------------- Right atrium: The atrium was normal in  size.  ------------------------------------------------------------------- Pericardium: There was no pericardial effusion.   Assessment / Plan:   Dilated Aortic root ascending aorta without history of Bicuspid aortic valve  or Marfan's or associated genetic disease, no family history of Aortic dissection or sudden unexplained death at young age.(sister with brain aneurysm and mother ruptured AAA). There has been very slow enlargement of aortic root over the past 9 years that  patient has been followed. Patient should continue on beta blocker, BP control (which appears good ) good dental care (decrease risk of endocarditis). Consider elective root replacement at 5.5 cm. The dx and radiographic findings have been discussed with the patient and wife in detail.    Aortic Size Index=      5.0   /Body surface area is 2.13 meters squared. =2.34  < 2.75 cm/m2      4% risk per year 2.75 to 4.25          8% risk per year > 4.25 cm/m2    20% risk per year  Chronic Kidney Disease  Cr 1.79    The natural history of dilated ascending aorta is discussed again with the patient and his wife in detail. We reviewed the signs and symptoms of aortic dissection. I specifically cautioned him about doing any kind of heavy weight lifting as this is contraindicated in anybody with dilated aorta.  I will plan to see patient back in 6  months with follow up MRA of chest ( instead of CT due to Stage IIIa CKD)  Grace Isaac MD      [address] [place] [address] Office [phone]   Beeper [phone]

## 2016-03-19 NOTE — Telephone Encounter (Signed)
  Follow up Call-  Call back number 03/18/2016  Post procedure Call Back phone  # [phone]  Permission to leave phone message Yes  Some recent data might be hidden     Patient questions:  Do you have a fever, pain , or abdominal swelling? No. Pain Score  0 *  Have you tolerated food without any problems? Yes.    Have you been able to return to your normal activities? Yes.    Do you have any questions about your discharge instructions: Diet   No. Medications  No. Follow up visit  No.  Do you have questions or concerns about your Care? No.  Actions: * If pain score is 4 or above: No action needed, pain <4.

## 2016-03-25 ENCOUNTER — Encounter: Payer: Self-pay | Admitting: Internal Medicine

## 2016-03-25 NOTE — Progress Notes (Signed)
1 Adenoma w/ high grade dysplasia and suspicious for invasion 1 tubular adenoma  I called patient and recommended surgical removal of the larger polyp that has HGD/invasion  Office - please refer him to Dr. [name] of [facility]  [place] - no letter but place a 1 year colon recall

## 2016-03-26 ENCOUNTER — Telehealth: Payer: Self-pay | Admitting: Internal Medicine

## 2016-03-26 NOTE — Telephone Encounter (Signed)
Pathology report discussed with wife and questions answered.

## 2016-04-13 DIAGNOSIS — M109 Gout, unspecified: Secondary | ICD-10-CM | POA: Diagnosis not present

## 2016-04-13 DIAGNOSIS — E669 Obesity, unspecified: Secondary | ICD-10-CM | POA: Diagnosis not present

## 2016-04-13 DIAGNOSIS — D631 Anemia in chronic kidney disease: Secondary | ICD-10-CM | POA: Diagnosis not present

## 2016-04-13 DIAGNOSIS — N189 Chronic kidney disease, unspecified: Secondary | ICD-10-CM | POA: Diagnosis not present

## 2016-04-13 DIAGNOSIS — N200 Calculus of kidney: Secondary | ICD-10-CM | POA: Diagnosis not present

## 2016-04-13 DIAGNOSIS — Z86711 Personal history of pulmonary embolism: Secondary | ICD-10-CM | POA: Diagnosis not present

## 2016-04-13 DIAGNOSIS — E538 Deficiency of other specified B group vitamins: Secondary | ICD-10-CM | POA: Diagnosis not present

## 2016-04-13 DIAGNOSIS — M1990 Unspecified osteoarthritis, unspecified site: Secondary | ICD-10-CM | POA: Diagnosis not present

## 2016-04-13 DIAGNOSIS — K5090 Crohn's disease, unspecified, without complications: Secondary | ICD-10-CM | POA: Diagnosis not present

## 2016-04-13 DIAGNOSIS — N183 Chronic kidney disease, stage 3 (moderate): Secondary | ICD-10-CM | POA: Diagnosis not present

## 2016-04-13 DIAGNOSIS — F4310 Post-traumatic stress disorder, unspecified: Secondary | ICD-10-CM | POA: Diagnosis not present

## 2016-04-13 DIAGNOSIS — I719 Aortic aneurysm of unspecified site, without rupture: Secondary | ICD-10-CM | POA: Diagnosis not present

## 2016-04-13 DIAGNOSIS — N2581 Secondary hyperparathyroidism of renal origin: Secondary | ICD-10-CM | POA: Diagnosis not present

## 2016-04-15 DIAGNOSIS — I712 Thoracic aortic aneurysm, without rupture: Secondary | ICD-10-CM | POA: Diagnosis not present

## 2016-04-15 DIAGNOSIS — K635 Polyp of colon: Secondary | ICD-10-CM | POA: Diagnosis not present

## 2016-04-21 ENCOUNTER — Encounter: Payer: Self-pay | Admitting: Physician Assistant
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
## 2016-04-22 ENCOUNTER — Ambulatory Visit (INDEPENDENT_AMBULATORY_CARE_PROVIDER_SITE_OTHER): Payer: Medicare Other | Admitting: Physician Assistant

## 2016-04-22 ENCOUNTER — Encounter: Payer: Self-pay | Admitting: Physician Assistant

## 2016-04-22 VITALS — BP 110/66 | HR 50 | Ht 72.0 in | Wt 202.8 lb

## 2016-04-22 DIAGNOSIS — Z01810 Encounter for preprocedural cardiovascular examination: Secondary | ICD-10-CM

## 2016-04-22 DIAGNOSIS — I10 Essential (primary) hypertension: Secondary | ICD-10-CM | POA: Diagnosis not present

## 2016-04-22 DIAGNOSIS — I351 Nonrheumatic aortic (valve) insufficiency: Secondary | ICD-10-CM | POA: Diagnosis not present

## 2016-04-22 DIAGNOSIS — I712 Thoracic aortic aneurysm, without rupture: Secondary | ICD-10-CM | POA: Diagnosis not present

## 2016-04-22 DIAGNOSIS — N183 Chronic kidney disease, stage 3 unspecified: Secondary | ICD-10-CM

## 2016-04-22 DIAGNOSIS — I7121 Aneurysm of the ascending aorta, without rupture: Secondary | ICD-10-CM

## 2016-04-22 NOTE — Patient Instructions (Addendum)
Medication Instructions:  Your physician recommends that you continue on your current medications as directed. Please refer to the Current Medication list given to you today.  Labwork: NONE  Testing/Procedures: NONE  Follow-Up: Your physician wants you to follow-up in: [place] DR. Aundra Dubin You will receive a reminder letter in the mail two months in advance. If you don't receive a letter, please call our office to schedule the follow-up appointment.  Any Other Special Instructions Will Be Listed Below (If Applicable).  If you need a refill on your cardiac medications before your next appointment, please call your pharmacy.

## 2016-04-23 NOTE — Progress Notes (Signed)
Followup Dr. Saunders Revel.

## 2016-05-07 DIAGNOSIS — I712 Thoracic aortic aneurysm, without rupture: Secondary | ICD-10-CM | POA: Diagnosis not present

## 2016-05-07 DIAGNOSIS — K635 Polyp of colon: Secondary | ICD-10-CM | POA: Diagnosis not present

## 2016-05-11 ENCOUNTER — Telehealth: Payer: Self-pay | Admitting: Gastroenterology

## 2016-05-11 ENCOUNTER — Encounter (HOSPITAL_COMMUNITY): Payer: Self-pay | Admitting: Radiology

## 2016-05-11 ENCOUNTER — Inpatient Hospital Stay (HOSPITAL_COMMUNITY)
Admission: EM | Admit: 2016-05-11 | Discharge: 2016-05-14 | DRG: 389 | Disposition: A | Discharge status: Home / Self Care | Payer: Medicare Other | Attending: Family Medicine | Admitting: Family Medicine

## 2016-05-11 ENCOUNTER — Emergency Department (HOSPITAL_COMMUNITY): Payer: Medicare Other

## 2016-05-11 DIAGNOSIS — Z85828 Personal history of other malignant neoplasm of skin: Secondary | ICD-10-CM

## 2016-05-11 DIAGNOSIS — N184 Chronic kidney disease, stage 4 (severe): Secondary | ICD-10-CM | POA: Diagnosis present

## 2016-05-11 DIAGNOSIS — I429 Cardiomyopathy, unspecified: Secondary | ICD-10-CM | POA: Diagnosis not present

## 2016-05-11 DIAGNOSIS — Z888 Allergy status to other drugs, medicaments and biological substances status: Secondary | ICD-10-CM

## 2016-05-11 DIAGNOSIS — K5080 Crohn's disease of both small and large intestine without complications: Secondary | ICD-10-CM | POA: Diagnosis present

## 2016-05-11 DIAGNOSIS — N183 Chronic kidney disease, stage 3 unspecified: Secondary | ICD-10-CM | POA: Diagnosis present

## 2016-05-11 DIAGNOSIS — K56609 Unspecified intestinal obstruction, unspecified as to partial versus complete obstruction: Secondary | ICD-10-CM | POA: Diagnosis not present

## 2016-05-11 DIAGNOSIS — K5651 Intestinal adhesions [bands], with partial obstruction: Secondary | ICD-10-CM | POA: Diagnosis not present

## 2016-05-11 DIAGNOSIS — I2510 Atherosclerotic heart disease of native coronary artery without angina pectoris: Secondary | ICD-10-CM | POA: Diagnosis present

## 2016-05-11 DIAGNOSIS — N2581 Secondary hyperparathyroidism of renal origin: Secondary | ICD-10-CM | POA: Diagnosis not present

## 2016-05-11 DIAGNOSIS — R911 Solitary pulmonary nodule: Secondary | ICD-10-CM | POA: Diagnosis present

## 2016-05-11 DIAGNOSIS — I428 Other cardiomyopathies: Secondary | ICD-10-CM

## 2016-05-11 DIAGNOSIS — E538 Deficiency of other specified B group vitamins: Secondary | ICD-10-CM | POA: Diagnosis present

## 2016-05-11 DIAGNOSIS — Z87891 Personal history of nicotine dependence: Secondary | ICD-10-CM

## 2016-05-11 DIAGNOSIS — G2581 Restless legs syndrome: Secondary | ICD-10-CM | POA: Diagnosis present

## 2016-05-11 DIAGNOSIS — D696 Thrombocytopenia, unspecified: Secondary | ICD-10-CM | POA: Diagnosis present

## 2016-05-11 DIAGNOSIS — R140 Abdominal distension (gaseous): Secondary | ICD-10-CM | POA: Diagnosis not present

## 2016-05-11 DIAGNOSIS — N281 Cyst of kidney, acquired: Secondary | ICD-10-CM | POA: Diagnosis not present

## 2016-05-11 DIAGNOSIS — M109 Gout, unspecified: Secondary | ICD-10-CM | POA: Diagnosis present

## 2016-05-11 DIAGNOSIS — M170 Bilateral primary osteoarthritis of knee: Secondary | ICD-10-CM | POA: Diagnosis present

## 2016-05-11 DIAGNOSIS — Z96612 Presence of left artificial shoulder joint: Secondary | ICD-10-CM | POA: Diagnosis present

## 2016-05-11 DIAGNOSIS — Z86010 Personal history of colonic polyps: Secondary | ICD-10-CM

## 2016-05-11 DIAGNOSIS — Z9049 Acquired absence of other specified parts of digestive tract: Secondary | ICD-10-CM

## 2016-05-11 DIAGNOSIS — G4700 Insomnia, unspecified: Secondary | ICD-10-CM | POA: Diagnosis present

## 2016-05-11 DIAGNOSIS — R109 Unspecified abdominal pain: Secondary | ICD-10-CM

## 2016-05-11 DIAGNOSIS — N1832 Chronic kidney disease, stage 3b: Secondary | ICD-10-CM | POA: Diagnosis present

## 2016-05-11 DIAGNOSIS — G629 Polyneuropathy, unspecified: Secondary | ICD-10-CM | POA: Diagnosis not present

## 2016-05-11 DIAGNOSIS — Z79899 Other long term (current) drug therapy: Secondary | ICD-10-CM

## 2016-05-11 DIAGNOSIS — F329 Major depressive disorder, single episode, unspecified: Secondary | ICD-10-CM | POA: Diagnosis present

## 2016-05-11 DIAGNOSIS — K219 Gastro-esophageal reflux disease without esophagitis: Secondary | ICD-10-CM | POA: Diagnosis present

## 2016-05-11 DIAGNOSIS — I129 Hypertensive chronic kidney disease with stage 1 through stage 4 chronic kidney disease, or unspecified chronic kidney disease: Secondary | ICD-10-CM | POA: Diagnosis present

## 2016-05-11 DIAGNOSIS — F419 Anxiety disorder, unspecified: Secondary | ICD-10-CM | POA: Diagnosis present

## 2016-05-11 DIAGNOSIS — F4312 Post-traumatic stress disorder, chronic: Secondary | ICD-10-CM | POA: Diagnosis present

## 2016-05-11 LAB — COMPREHENSIVE METABOLIC PANEL
ALT: 33 U/L (ref 17–63)
ANION GAP: 11 (ref 5–15)
AST: 34 U/L (ref 15–41)
Albumin: 4.6 g/dL (ref 3.5–5.0)
Alkaline Phosphatase: 141 U/L — ABNORMAL HIGH (ref 38–126)
BUN: 17 mg/dL (ref 6–20)
CHLORIDE: 107 mmol/L (ref 101–111)
CO2: 22 mmol/L (ref 22–32)
Calcium: 9.8 mg/dL (ref 8.9–10.3)
Creatinine, Ser: 1.87 mg/dL — ABNORMAL HIGH (ref 0.61–1.24)
GFR, EST AFRICAN AMERICAN: 39 mL/min — AB (ref >60)
GFR, EST NON AFRICAN AMERICAN: 34 mL/min — AB (ref >60)
Glucose, Bld: 102 mg/dL — ABNORMAL HIGH (ref 65–99)
POTASSIUM: 4.9 mmol/L (ref 3.5–5.1)
Sodium: 140 mmol/L (ref 135–145)
Total Bilirubin: 1.2 mg/dL (ref 0.3–1.2)
Total Protein: 7.6 g/dL (ref 6.5–8.1)

## 2016-05-11 LAB — URINALYSIS, ROUTINE W REFLEX MICROSCOPIC
Glucose, UA: NEGATIVE mg/dL
Hgb urine dipstick: NEGATIVE
KETONES UR: NEGATIVE mg/dL
NITRITE: NEGATIVE
PROTEIN: NEGATIVE mg/dL
Specific Gravity, Urine: 1.025 (ref 1.005–1.030)
pH: 6 (ref 5.0–8.0)

## 2016-05-11 LAB — URINE MICROSCOPIC-ADD ON

## 2016-05-11 LAB — CBC
HCT: 45.3 % (ref 39.0–52.0)
Hemoglobin: 15.4 g/dL (ref 13.0–17.0)
MCH: 29.8 pg (ref 26.0–34.0)
MCHC: 34 g/dL (ref 30.0–36.0)
MCV: 87.6 fL (ref 78.0–100.0)
Platelets: 111 10*3/uL — ABNORMAL LOW (ref 150–400)
RBC: 5.17 MIL/uL (ref 4.22–5.81)
RDW: 14.1 % (ref 11.5–15.5)
WBC: 7 10*3/uL (ref 4.0–10.5)

## 2016-05-11 LAB — LIPASE, BLOOD: LIPASE: 31 U/L (ref 11–51)

## 2016-05-11 MED ORDER — ONDANSETRON HCL 4 MG/2ML IJ SOLN
4.0000 mg | Freq: Once | INTRAMUSCULAR | Status: AC
  Administered 2016-05-11: 4 mg via INTRAVENOUS
  Filled 2016-05-11: qty 2

## 2016-05-11 MED ORDER — FENTANYL CITRATE (PF) 100 MCG/2ML IJ SOLN
100.0000 ug | INTRAMUSCULAR | Status: DC | PRN
  Administered 2016-05-11 (×2): 100 ug via INTRAVENOUS
  Filled 2016-05-11 (×2): qty 2

## 2016-05-11 MED ORDER — IOPAMIDOL (ISOVUE-300) INJECTION 61%
80.0000 mL | Freq: Once | INTRAVENOUS | Status: AC | PRN
  Administered 2016-05-11: 80 mL via INTRAVENOUS

## 2016-05-11 NOTE — ED Triage Notes (Signed)
Pt states that he has a hx of approx. 10 bowel blockages and feels as if he is having one now. States that he has been unable to have a BM since yesterday even thought he has had the urge, has had abdominal distention and N/V. Alert and oriented.

## 2016-05-11 NOTE — Telephone Encounter (Signed)
On call note at 34. Pt and pts wife on phone. Pt with history of Crohn's and a stricture at ileocolonic anastomosis which was dilated in 2013. Colonoscopy in 03/2016 did not show a significant anastomotic stricture. Starting acutely today, he notes abdominal distention, abdominal pain, N/V and no BM/flatus. Symptoms he feels are typical of his prior bowel obstructions. Advised NPO and go immediately to [facility] ED (his hospital preference) ASAP. His wife can take him there.

## 2016-05-11 NOTE — ED Notes (Signed)
This nurse attempted IV access x2.  Unsuccessful. Charge RN to try.

## 2016-05-11 NOTE — ED Notes (Signed)
RN tiffany obtaining line and getting labs
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
## 2016-05-12 ENCOUNTER — Encounter (HOSPITAL_COMMUNITY): Payer: Self-pay | Admitting: Family Medicine

## 2016-05-12 DIAGNOSIS — K5080 Crohn's disease of both small and large intestine without complications: Secondary | ICD-10-CM | POA: Diagnosis not present

## 2016-05-12 DIAGNOSIS — I2510 Atherosclerotic heart disease of native coronary artery without angina pectoris: Secondary | ICD-10-CM

## 2016-05-12 DIAGNOSIS — R911 Solitary pulmonary nodule: Secondary | ICD-10-CM | POA: Diagnosis present

## 2016-05-12 DIAGNOSIS — K219 Gastro-esophageal reflux disease without esophagitis: Secondary | ICD-10-CM | POA: Diagnosis present

## 2016-05-12 DIAGNOSIS — Z9049 Acquired absence of other specified parts of digestive tract: Secondary | ICD-10-CM | POA: Diagnosis not present

## 2016-05-12 DIAGNOSIS — N2581 Secondary hyperparathyroidism of renal origin: Secondary | ICD-10-CM | POA: Diagnosis present

## 2016-05-12 DIAGNOSIS — I129 Hypertensive chronic kidney disease with stage 1 through stage 4 chronic kidney disease, or unspecified chronic kidney disease: Secondary | ICD-10-CM | POA: Diagnosis present

## 2016-05-12 DIAGNOSIS — F4312 Post-traumatic stress disorder, chronic: Secondary | ICD-10-CM | POA: Diagnosis present

## 2016-05-12 DIAGNOSIS — K5651 Intestinal adhesions [bands], with partial obstruction: Secondary | ICD-10-CM | POA: Diagnosis present

## 2016-05-12 DIAGNOSIS — G4700 Insomnia, unspecified: Secondary | ICD-10-CM | POA: Diagnosis present

## 2016-05-12 DIAGNOSIS — R1084 Generalized abdominal pain: Secondary | ICD-10-CM | POA: Diagnosis not present

## 2016-05-12 DIAGNOSIS — I429 Cardiomyopathy, unspecified: Secondary | ICD-10-CM

## 2016-05-12 DIAGNOSIS — K56609 Unspecified intestinal obstruction, unspecified as to partial versus complete obstruction: Secondary | ICD-10-CM

## 2016-05-12 DIAGNOSIS — Z87891 Personal history of nicotine dependence: Secondary | ICD-10-CM | POA: Diagnosis not present

## 2016-05-12 DIAGNOSIS — K56699 Other intestinal obstruction unspecified as to partial versus complete obstruction: Secondary | ICD-10-CM | POA: Diagnosis not present

## 2016-05-12 DIAGNOSIS — Z79899 Other long term (current) drug therapy: Secondary | ICD-10-CM | POA: Diagnosis not present

## 2016-05-12 DIAGNOSIS — G2581 Restless legs syndrome: Secondary | ICD-10-CM | POA: Diagnosis present

## 2016-05-12 DIAGNOSIS — N183 Chronic kidney disease, stage 3 (moderate): Secondary | ICD-10-CM | POA: Diagnosis not present

## 2016-05-12 DIAGNOSIS — Z888 Allergy status to other drugs, medicaments and biological substances status: Secondary | ICD-10-CM | POA: Diagnosis not present

## 2016-05-12 DIAGNOSIS — Z85828 Personal history of other malignant neoplasm of skin: Secondary | ICD-10-CM | POA: Diagnosis not present

## 2016-05-12 DIAGNOSIS — K50819 Crohn's disease of both small and large intestine with unspecified complications: Secondary | ICD-10-CM

## 2016-05-12 DIAGNOSIS — Z96612 Presence of left artificial shoulder joint: Secondary | ICD-10-CM | POA: Diagnosis present

## 2016-05-12 DIAGNOSIS — D696 Thrombocytopenia, unspecified: Secondary | ICD-10-CM

## 2016-05-12 DIAGNOSIS — M109 Gout, unspecified: Secondary | ICD-10-CM | POA: Diagnosis present

## 2016-05-12 DIAGNOSIS — E538 Deficiency of other specified B group vitamins: Secondary | ICD-10-CM | POA: Diagnosis present

## 2016-05-12 DIAGNOSIS — M170 Bilateral primary osteoarthritis of knee: Secondary | ICD-10-CM | POA: Diagnosis present

## 2016-05-12 DIAGNOSIS — F419 Anxiety disorder, unspecified: Secondary | ICD-10-CM | POA: Diagnosis present

## 2016-05-12 DIAGNOSIS — G629 Polyneuropathy, unspecified: Secondary | ICD-10-CM | POA: Diagnosis present

## 2016-05-12 LAB — BASIC METABOLIC PANEL
ANION GAP: 13 (ref 5–15)
BUN: 18 mg/dL (ref 6–20)
CALCIUM: 9.1 mg/dL (ref 8.9–10.3)
CO2: 22 mmol/L (ref 22–32)
Chloride: 106 mmol/L (ref 101–111)
Creatinine, Ser: 1.69 mg/dL — ABNORMAL HIGH (ref 0.61–1.24)
GFR calc Af Amer: 45 mL/min — ABNORMAL LOW (ref >60)
GFR, EST NON AFRICAN AMERICAN: 38 mL/min — AB (ref >60)
GLUCOSE: 104 mg/dL — AB (ref 65–99)
POTASSIUM: 4.8 mmol/L (ref 3.5–5.1)
SODIUM: 141 mmol/L (ref 135–145)

## 2016-05-12 MED ORDER — TERAZOSIN HCL 2 MG PO CAPS
2.0000 mg | ORAL_CAPSULE | Freq: Every day | ORAL | Status: DC
  Administered 2016-05-12 – 2016-05-13 (×2): 2 mg via ORAL
  Filled 2016-05-12 (×2): qty 1

## 2016-05-12 MED ORDER — SODIUM CHLORIDE 0.9 % IV SOLN
INTRAVENOUS | Status: DC
  Administered 2016-05-12: 09:00:00 via INTRAVENOUS
  Administered 2016-05-12: 1000 mL via INTRAVENOUS
  Administered 2016-05-12 – 2016-05-14 (×4): via INTRAVENOUS

## 2016-05-12 MED ORDER — FEBUXOSTAT 40 MG PO TABS
80.0000 mg | ORAL_TABLET | Freq: Every day | ORAL | Status: DC
  Administered 2016-05-12 – 2016-05-13 (×2): 80 mg via ORAL
  Filled 2016-05-12 (×2): qty 2

## 2016-05-12 MED ORDER — ENOXAPARIN SODIUM 30 MG/0.3ML ~~LOC~~ SOLN
30.0000 mg | Freq: Every day | SUBCUTANEOUS | Status: DC
  Administered 2016-05-12: 30 mg via SUBCUTANEOUS
  Filled 2016-05-12: qty 0.3

## 2016-05-12 MED ORDER — ONDANSETRON HCL 4 MG/2ML IJ SOLN
4.0000 mg | INTRAMUSCULAR | Status: DC | PRN
  Administered 2016-05-12: 4 mg via INTRAVENOUS

## 2016-05-12 MED ORDER — ENOXAPARIN SODIUM 40 MG/0.4ML ~~LOC~~ SOLN
40.0000 mg | Freq: Every day | SUBCUTANEOUS | Status: DC
  Administered 2016-05-12 – 2016-05-13 (×2): 40 mg via SUBCUTANEOUS
  Filled 2016-05-12 (×2): qty 0.4

## 2016-05-12 MED ORDER — PROMETHAZINE HCL 25 MG/ML IJ SOLN: 12.5000 mg | Freq: Four times a day (QID) | INTRAMUSCULAR | Status: DC | PRN

## 2016-05-12 MED ORDER — TRAZODONE HCL 50 MG PO TABS
150.0000 mg | ORAL_TABLET | Freq: Every day | ORAL | Status: DC
  Administered 2016-05-12 – 2016-05-13 (×2): 150 mg via ORAL
  Filled 2016-05-12 (×2): qty 1

## 2016-05-12 MED ORDER — PAROXETINE HCL 20 MG PO TABS
40.0000 mg | ORAL_TABLET | Freq: Every day | ORAL | Status: DC
  Administered 2016-05-12 – 2016-05-13 (×2): 40 mg via ORAL
  Filled 2016-05-12 (×2): qty 2

## 2016-05-12 MED ORDER — COLCHICINE 0.6 MG PO TABS
0.6000 mg | ORAL_TABLET | Freq: Every day | ORAL | Status: DC
  Administered 2016-05-12 – 2016-05-13 (×2): 0.6 mg via ORAL
  Filled 2016-05-12: qty 1

## 2016-05-12 MED ORDER — SODIUM CHLORIDE 0.9 % IV BOLUS (SEPSIS)
1000.0000 mL | Freq: Once | INTRAVENOUS | Status: AC
  Administered 2016-05-12: 1000 mL via INTRAVENOUS

## 2016-05-12 MED ORDER — ONDANSETRON HCL 4 MG/2ML IJ SOLN
4.0000 mg | Freq: Four times a day (QID) | INTRAMUSCULAR | Status: DC | PRN
  Administered 2016-05-12: 4 mg via INTRAVENOUS
  Filled 2016-05-12: qty 2

## 2016-05-12 MED ORDER — ONDANSETRON HCL 4 MG/2ML IJ SOLN
INTRAMUSCULAR | Status: AC
  Filled 2016-05-12: qty 2

## 2016-05-12 MED ORDER — HYDROMORPHONE HCL 1 MG/ML IJ SOLN
1.0000 mg | INTRAMUSCULAR | Status: DC | PRN
  Administered 2016-05-12 – 2016-05-13 (×3): 1 mg via INTRAVENOUS
  Filled 2016-05-12 (×3): qty 1

## 2016-05-12 NOTE — Progress Notes (Signed)
PROGRESS NOTE  Derek [name]  DZH:[id number] DOB: August 07, 1940 DOA: 05/11/2016 PCP: Purvis Kilts, MD  Outpatient Specialists: Cardiology: Dr. Aundra Dubin GI: Dr. Carlean Purl (formerly Dr. [name]) Nephrology: Dr. Jimmy Footman Cardiothoracic surgery: Dr. Servando Snare  Brief Narrative: Derek [name] is a 75 y.o. male with a history of Crohn's disease s/p ileocolonic resection x2 in 1981 not on active therapy, multiple abdominal surgeries who presented on 10/9 with abdominal distention and vomiting consistent with prior SBO's. He had eaten some soup with abdominal pain and subsequently got "swollen like a tick" and started to have NBNB emesis several times and crampy, severe, diffuse abdominal pain, so he came to the ER. On arrival he was afebrile, vitals wnl. Abdominal radiograph showed multiple air-fluid levels, and follow-up CT of the abdomen and pelvis with contrast showed a partial SBO. NG tube was placed with relief of symptoms. He has been given IV fluids and made NPO. General surgery and GI have seen him this morning and plan is to continue NPO, clamping for administration of chronic meds. Will consider clamping trial/advancing diet tomorrow. GI doubts active Crohn's, most likely etiology is adhesions for this recurrent partial SBO for which he usually stays in the hospital for 2 - 3 days and has never required surgery.   Assessment & Plan: Principal Problem:   SBO (small bowel obstruction) Active Problems:   CORONARY ATHEROSCLEROSIS NATIVE CORONARY ARTERY   Crohn's ileocolitis (HCC)   CKD (chronic kidney disease) stage 3, GFR 30-59 ml/min   Thrombocytopenia (HCC)   Cardiomyopathy (HCC)   Lung nodule  Partial SBO, recurrent: Due to adhesions, unlikely Crohn's flare. Still with significant NGT output. - GI and gen surg following.  - Continue NPO, NGT, IVF and supportive care with anti-emetic and analgesia.   History of Crohn's disease: Chronic, stable, asymptomatic not on therapy. Has  history of pancreatitis thought to be due to Humira.  - No steroids per GI - Holding PPI  History of colonic polyp concerning for high-grade dysplasia and invasion: Noted by Dr. Carlean Purl during colonoscopy Aug 2017. - Plan is for surgery Oct 24.  CKD:  Stable  Cardiomyopathy: EF 45%.  No history of CHF symptoms  Gout: Chronic, stable, not in active flare - Continue urate lowering therapy, febuxostat, colchicine, ok to clamp tube for administration.  PTSD: Chronic, stable, related to [place] War. Seen at [facility].  - Restart medications, paroxetine, trazodone, ok to clamp NGT  Incidental lung nodule: Former smoker.  Nodule not noted on MR chest done in August. - Repeat CT without contrast in 6-12 months (Apr to Oct of 2018)  HTN: Chronic, stable. BP normal since admission - Hold atenolol  DVT prophylaxis: Lovenox Code Status: Full Family Communication: Declined offer to call family this AM Disposition Plan: Continue inpatient admission for SBO  Consultants:   GI, Dr. Carlean Purl  General surgery  Procedures:   Antimicrobials:  None   Subjective: Distention and pain subsided since NG tube was placed. No further nausea or emesis.   Objective: Vitals:   05/12/16 0000 05/12/16 0115 05/12/16 0500 05/12/16 1339  BP: 104/72 125/75 100/75 119/81  Pulse: 65 (!) 59 60 68  Resp:  16 14 14   Temp:  97.7 F (36.5 C) 98.1 F (36.7 C) 98.3 F (36.8 C)  TempSrc:  Oral Oral Oral  SpO2: 91% 97% 97% 97%  Weight:      Height:        Intake/Output Summary (Last 24 hours) at 05/12/16 1348 Last data filed at  05/12/16 1339  Gross per 24 hour  Intake           628.75 ml  Output             3050 ml  Net         -2421.25 ml   Filed Weights   05/11/16 2003  Weight: 93.6 kg (206 lb 6 oz)    Examination: General exam: 75 y.o. male in no distress Respiratory system: Non-labored breathing. Clear to auscultation bilaterally.  Cardiovascular system: Regular rate and  rhythm. No murmur, rub, or gallop. No JVD, and no pedal edema. Gastrointestinal system: Abdomen mildly tender throughout, mildly distended. No rebound or guarding. Hypoactive bowel sounds.  Central nervous system: Alert and oriented. No focal neurological deficits. Extremities: Warm, no deformities Skin: No rashes, lesions no ulcers Psychiatry: Judgement and insight appear normal. Mood & affect appropriate.   Data Reviewed: I have personally reviewed following labs and imaging studies  CBC:  Recent Labs Lab 05/11/16 2048  WBC 7.0  HGB 15.4  HCT 45.3  MCV 87.6  PLT 212*   Basic Metabolic Panel:  Recent Labs Lab 05/11/16 2048 05/12/16 0448  NA 140 141  K 4.9 4.8  CL 107 106  CO2 22 22  GLUCOSE 102* 104*  BUN 17 18  CREATININE 1.87* 1.69*  CALCIUM 9.8 9.1   GFR: Estimated Creatinine Clearance: 46.3 mL/min (by C-G formula based on SCr of 1.69 mg/dL (H)). Liver Function Tests:  Recent Labs Lab 05/11/16 2048  AST 34  ALT 33  ALKPHOS 141*  BILITOT 1.2  PROT 7.6  ALBUMIN 4.6    Recent Labs Lab 05/11/16 2048  LIPASE 31   No results for input(s): AMMONIA in the last 168 hours. Coagulation Profile: No results for input(s): INR, PROTIME in the last 168 hours. Cardiac Enzymes: No results for input(s): CKTOTAL, CKMB, CKMBINDEX, TROPONINI in the last 168 hours. BNP (last 3 results) No results for input(s): PROBNP in the last 8760 hours. HbA1C: No results for input(s): HGBA1C in the last 72 hours. CBG: No results for input(s): GLUCAP in the last 168 hours. Lipid Profile: No results for input(s): CHOL, HDL, LDLCALC, TRIG, CHOLHDL, LDLDIRECT in the last 72 hours. Thyroid Function Tests: No results for input(s): TSH, T4TOTAL, FREET4, T3FREE, THYROIDAB in the last 72 hours. Anemia Panel: No results for input(s): VITAMINB12, FOLATE, FERRITIN, TIBC, IRON, RETICCTPCT in the last 72 hours. Urine analysis:    Component Value Date/Time   COLORURINE AMBER (A)  05/11/2016 2153   APPEARANCEUR CLEAR 05/11/2016 2153   LABSPEC 1.025 05/11/2016 2153   PHURINE 6.0 05/11/2016 2153   GLUCOSEU NEGATIVE 05/11/2016 2153   HGBUR NEGATIVE 05/11/2016 2153   BILIRUBINUR SMALL (A) 05/11/2016 2153   KETONESUR NEGATIVE 05/11/2016 2153   PROTEINUR NEGATIVE 05/11/2016 2153   NITRITE NEGATIVE 05/11/2016 2153   LEUKOCYTESUR TRACE (A) 05/11/2016 2153   Sepsis Labs: @LABRCNTIP (procalcitonin:4,lacticidven:4)  )No results found for this or any previous visit (from the past 240 hour(s)).   Radiology Studies: Ct Abdomen Pelvis W Contrast  Result Date: 05/11/2016 CLINICAL DATA:  Unable to have bowel movements, with abdominal distention, nausea and vomiting. Personal history of bowel obstructions. Initial encounter. EXAM: CT ABDOMEN AND PELVIS WITH CONTRAST TECHNIQUE: Multidetector CT imaging of the abdomen and pelvis was performed using the standard protocol following bolus administration of intravenous contrast. CONTRAST:  52m ISOVUE-300 IOPAMIDOL (ISOVUE-300) INJECTION 61% COMPARISON:  CT of the abdomen and pelvis from 03/09/2012, and MRI of the brain performed 03/03/2013 FINDINGS:  Lower chest: Mild left basilar atelectasis is noted. A 6 mm nodule is noted at the left lower lobe (image 3 of 67). Scattered coronary artery calcifications are seen. Hepatobiliary: A small calcified granuloma is noted at the hepatic dome. The liver is otherwise unremarkable. The patient is status post cholecystectomy, with clips noted at the gallbladder fossa. The common bile duct remains normal in caliber. Pancreas: The pancreas is within normal limits. Spleen: The spleen is enlarged, measuring 16.0 cm in length. Adrenals/Urinary Tract: The adrenal glands are grossly unremarkable in appearance. There is moderate bilateral renal atrophy, with scattered bilateral renal cysts. Small nonobstructing bilateral renal stones are seen, measuring up to 3 mm in size. Mild nonspecific perinephric stranding is  noted bilaterally. Stomach/Bowel: There is dilatation of small-bowel loops up to 4.6 cm in maximal diameter, with gradual fecalization at the distal ileum, and decompression distal to focal clumping and mild ileal wall thickening at the right lower quadrant. This likely reflects at least partial small bowel obstruction, due to an underlying adhesion. Residual fluid and air is noted within the ascending colon. The patient's ileocolic anastomosis is grossly unremarkable in appearance. The more distal colon is decompressed and grossly unremarkable in appearance. The stomach is largely filled with air and fluid, with an enteric tube seen ending at the body of the stomach. Vascular/Lymphatic: Scattered calcification is seen along the abdominal aorta and its branches. The abdominal aorta is otherwise grossly unremarkable. The inferior vena cava is grossly unremarkable. No retroperitoneal lymphadenopathy is seen. No pelvic sidewall lymphadenopathy is identified. Reproductive: The bladder is decompressed and not well characterized. The prostate remains normal in size. Other: No additional soft tissue abnormalities are seen. Musculoskeletal: No acute osseous abnormalities are identified. The visualized musculature is unremarkable in appearance. IMPRESSION: 1. Dilatation of small-bowel loops to 4.6 cm in maximal diameter, with gradual fecalization at the distal ileum, and decompression distal to focal clumping and mild ileal wall thickening at the right lower quadrant. This likely reflects at least partial small bowel obstruction, due to an underlying adhesion. 2. Ileocolic anastomosis is grossly unremarkable in appearance. 3. Stomach largely filled with air and fluid. 4. Scattered coronary artery calcifications seen. 5. Moderate bilateral renal atrophy, with scattered bilateral renal cysts. Nonobstructing bilateral renal stones measure up to 3 mm in size. 6. Scattered aortic atherosclerosis noted. 7. Mild left basilar  atelectasis noted. 8. Splenomegaly noted. 9. **An incidental finding of potential clinical significance has been found. 6 mm nodule at the left lower lung lobe. Non-contrast chest CT at 6-12 months is recommended. If the nodule is stable at time of repeat CT, then future CT at 18-24 months (from today's scan) is considered optional for low-risk patients, but is recommended for high-risk patients. This recommendation follows the consensus statement: Guidelines for Management of Incidental Pulmonary Nodules Detected on CT Images: From the Fleischner Society 2017; Radiology 2017; 284:228-243.** Electronically Signed   By: Garald Balding M.D.   On: 05/11/2016 23:21   Dg Abd Acute W/chest  Result Date: 05/11/2016 CLINICAL DATA:  Abdominal distention, abdominal pain, nausea and vomiting. History of Crohn's disease and bowel obstruction. EXAM: DG ABDOMEN ACUTE W/ 1V CHEST COMPARISON:  CT abdomen/ pelvis performed subsequently available at time of radiograph interpretation. FINDINGS: Scattered atelectasis at the lung bases. Heart is upper limits of normal in size. There is atherosclerosis of the thoracic aorta. No pleural fluid. Enteric tube in place, tip and side port below the diaphragm in the stomach. Stomach is distended with air-fluid level. Scattered  air-fluid levels throughout small bowel. Enteric sutures in the right lower quadrant of the abdomen. No free air. Minimal stool burden throughout the colon. Cholecystectomy clips in the right upper quadrant. There are vascular calcifications. No acute osseous abnormalities are seen. IMPRESSION: 1. Distended stomach with air-fluid level, and air-fluid levels throughout small bowel. Findings suggest small bowel obstruction. 2. Enteric tube in the stomach. 3. Bibasilar atelectasis.  Thoracic aortic atherosclerosis. Electronically Signed   By: Jeb Levering M.D.   On: 05/11/2016 22:54    Scheduled Meds: . enoxaparin (LOVENOX) injection  40 mg Subcutaneous QHS    Continuous Infusions: . sodium chloride 125 mL/hr at 05/12/16 0857     LOS: 0 days   Time spent: 25 minutes.  Vance Gather, MD [facility] Pager [phone]  If 7PM-7AM, please contact night-coverage [email or web address] Password [email or web address] 05/12/2016, 1:48 PM
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
## 2016-05-13 ENCOUNTER — Inpatient Hospital Stay (HOSPITAL_COMMUNITY): Payer: Medicare Other

## 2016-05-13 NOTE — Progress Notes (Signed)
[facility]:  [phone] General Surgery Progress Note   LOS: 1 day  POD -     Assessment/Plan: 1.  SBO   Clinically better, KUB more equivical.  To clamp NGT and start clear liquids  Discussed with Dr. Carlean Purl  2.  Flat polyp in the right transverse colon that showed high grade dysplasia with focal area suspicious for superficial invasion seen on colonoscopy by Dr. Carlean Purl 03/18/2016.    3.  Crohn's disease not currently on disease-specific therapy s/p ileocolonic resection 2 4.  Aortic root aneurysm             Followed by Dr. Festus Barren 5.  CKD III             Sees Dr. Lenna Sciara. Deterding 6.  Gout 7.  PTSD 8.  EF 45% cardiomyopathy without CHF             Sees Dr. Einar Crow for cards 9.  Thrombocytopenia  05/12/2016 - 111,000 10.  H/o DVT (no longer on anticoagulant) 11.  Hepatitis C 12.  DVT prophylaxis - Lovenox   Principal Problem:   SBO (small bowel obstruction) Active Problems:   CORONARY ATHEROSCLEROSIS NATIVE CORONARY ARTERY   Crohn's ileocolitis (HCC)   CKD (chronic kidney disease) stage 3, GFR 30-59 ml/min   Thrombocytopenia (HCC)   Cardiomyopathy (HCC)   Lung nodule  Subjective:  Doing better  Objective:   Vitals:   05/12/16 2055 05/13/16 0557  BP: 115/83 (!) 99/54  Pulse: 79 65  Resp: 16 16  Temp: 98.9 F (37.2 C) 98.4 F (36.9 C)     Intake/Output from previous day:  10/10 0701 - 10/11 0700 In: 3120 [P.O.:120; I.V.:3000] Out: 2700 [Urine:650; Emesis/NG output:2050]  Intake/Output this shift:  No intake/output data recorded.   Physical Exam:   General: WN older WM who is alert and oriented.    HEENT: Normal. Pupils equal. .   Lungs: Clear   Abdomen: Soft,  Multiple scars   Lab Results:    Recent Labs  05/11/16 2048  WBC 7.0  HGB 15.4  HCT 45.3  PLT 111*    BMET   Recent Labs  05/11/16 2048 05/12/16 0448  NA 140 141  K 4.9 4.8  CL 107 106  CO2 22 22  GLUCOSE 102* 104*  BUN 17 18  CREATININE 1.87*  1.69*  CALCIUM 9.8 9.1    PT/INR  No results for input(s): LABPROT, INR in the last 72 hours.  ABG  No results for input(s): PHART, HCO3 in the last 72 hours.  Invalid input(s): PCO2, PO2   Studies/Results:  Dg Abd 1 View  Result Date: 05/13/2016 CLINICAL DATA:  Followup small bowel obstruction. EXAM: ABDOMEN - 1 VIEW COMPARISON:  05/11/2016 FINDINGS: Nasogastric tube tip again seen within the proximal stomach. Mild decrease in dilatation of small bowel loops is seen since previous study. Air-fluid levels cannot be assessed due to lack over recto view. Multiple surgical clips and staples seen within the right abdomen. IMPRESSION: Mild decrease in dilated small bowel loops since prior study. Electronically Signed   By: Earle Gell M.D.   On: 05/13/2016 07:53   Ct Abdomen Pelvis W Contrast  Result Date: 05/11/2016 CLINICAL DATA:  Unable to have bowel movements, with abdominal distention, nausea and vomiting. Personal history of bowel obstructions. Initial encounter. EXAM: CT ABDOMEN AND PELVIS WITH CONTRAST TECHNIQUE: Multidetector CT imaging of the abdomen and pelvis was performed using the standard protocol following bolus administration of  intravenous contrast. CONTRAST:  40m ISOVUE-300 IOPAMIDOL (ISOVUE-300) INJECTION 61% COMPARISON:  CT of the abdomen and pelvis from 03/09/2012, and MRI of the brain performed 03/03/2013 FINDINGS: Lower chest: Mild left basilar atelectasis is noted. A 6 mm nodule is noted at the left lower lobe (image 3 of 67). Scattered coronary artery calcifications are seen. Hepatobiliary: A small calcified granuloma is noted at the hepatic dome. The liver is otherwise unremarkable. The patient is status post cholecystectomy, with clips noted at the gallbladder fossa. The common bile duct remains normal in caliber. Pancreas: The pancreas is within normal limits. Spleen: The spleen is enlarged, measuring 16.0 cm in length. Adrenals/Urinary Tract: The adrenal glands are grossly  unremarkable in appearance. There is moderate bilateral renal atrophy, with scattered bilateral renal cysts. Small nonobstructing bilateral renal stones are seen, measuring up to 3 mm in size. Mild nonspecific perinephric stranding is noted bilaterally. Stomach/Bowel: There is dilatation of small-bowel loops up to 4.6 cm in maximal diameter, with gradual fecalization at the distal ileum, and decompression distal to focal clumping and mild ileal wall thickening at the right lower quadrant. This likely reflects at least partial small bowel obstruction, due to an underlying adhesion. Residual fluid and air is noted within the ascending colon. The patient's ileocolic anastomosis is grossly unremarkable in appearance. The more distal colon is decompressed and grossly unremarkable in appearance. The stomach is largely filled with air and fluid, with an enteric tube seen ending at the body of the stomach. Vascular/Lymphatic: Scattered calcification is seen along the abdominal aorta and its branches. The abdominal aorta is otherwise grossly unremarkable. The inferior vena cava is grossly unremarkable. No retroperitoneal lymphadenopathy is seen. No pelvic sidewall lymphadenopathy is identified. Reproductive: The bladder is decompressed and not well characterized. The prostate remains normal in size. Other: No additional soft tissue abnormalities are seen. Musculoskeletal: No acute osseous abnormalities are identified. The visualized musculature is unremarkable in appearance. IMPRESSION: 1. Dilatation of small-bowel loops to 4.6 cm in maximal diameter, with gradual fecalization at the distal ileum, and decompression distal to focal clumping and mild ileal wall thickening at the right lower quadrant. This likely reflects at least partial small bowel obstruction, due to an underlying adhesion. 2. Ileocolic anastomosis is grossly unremarkable in appearance. 3. Stomach largely filled with air and fluid. 4. Scattered coronary  artery calcifications seen. 5. Moderate bilateral renal atrophy, with scattered bilateral renal cysts. Nonobstructing bilateral renal stones measure up to 3 mm in size. 6. Scattered aortic atherosclerosis noted. 7. Mild left basilar atelectasis noted. 8. Splenomegaly noted. 9. **An incidental finding of potential clinical significance has been found. 6 mm nodule at the left lower lung lobe. Non-contrast chest CT at 6-12 months is recommended. If the nodule is stable at time of repeat CT, then future CT at 18-24 months (from today's scan) is considered optional for low-risk patients, but is recommended for high-risk patients. This recommendation follows the consensus statement: Guidelines for Management of Incidental Pulmonary Nodules Detected on CT Images: From the Fleischner Society 2017; Radiology 2017; 284:228-243.** Electronically Signed   By: JGarald BaldingM.D.   On: 05/11/2016 23:21   Dg Abd Acute W/chest  Result Date: 05/11/2016 CLINICAL DATA:  Abdominal distention, abdominal pain, nausea and vomiting. History of Crohn's disease and bowel obstruction. EXAM: DG ABDOMEN ACUTE W/ 1V CHEST COMPARISON:  CT abdomen/ pelvis performed subsequently available at time of radiograph interpretation. FINDINGS: Scattered atelectasis at the lung bases. Heart is upper limits of normal in size. There is atherosclerosis  of the thoracic aorta. No pleural fluid. Enteric tube in place, tip and side port below the diaphragm in the stomach. Stomach is distended with air-fluid level. Scattered air-fluid levels throughout small bowel. Enteric sutures in the right lower quadrant of the abdomen. No free air. Minimal stool burden throughout the colon. Cholecystectomy clips in the right upper quadrant. There are vascular calcifications. No acute osseous abnormalities are seen. IMPRESSION: 1. Distended stomach with air-fluid level, and air-fluid levels throughout small bowel. Findings suggest small bowel obstruction. 2. Enteric tube  in the stomach. 3. Bibasilar atelectasis.  Thoracic aortic atherosclerosis. Electronically Signed   By: Jeb Levering M.D.   On: 05/11/2016 22:54     Anti-infectives:   Anti-infectives    None      [name], MD, FACS Pager: [phone] Surgery Office: [phone] 05/13/2016

## 2016-05-13 NOTE — Progress Notes (Signed)
PROGRESS NOTE  Derek [name]  NLZ:[id number] DOB: September 01, 1940 DOA: 05/11/2016 PCP: Purvis Kilts, MD  Outpatient Specialists: Cardiology: Dr. Aundra Dubin GI: Dr. Carlean Purl (formerly Dr. [name]) Nephrology: Dr. Jimmy Footman Cardiothoracic surgery: Dr. Servando Snare  Brief Narrative: 75 y.o. ? Crohn's disease s/p ileocolonic resection x2 in 1981 not on active therapy,  multiple abdominal surgeries who presented on 10/9 with abdominal distention and vomiting consistent with prior SBO's. - started to have NBNB emesis several times and crampy, severe, diffuse abdominal pain, so he came to the ER.  On arrival he was afebrile, vitals wnl.  Abdominal radiograph showed multiple air-fluid levels, and follow-up CT of the abdomen and pelvis with contrast showed a partial SBO.  NG tube was placed with relief of symptoms.  He has been given IV fluids and made NPO.  General surgery and GI have seen him this morning and plan is to continue NPO, clamping for administration of chronic meds.  Will consider clamping trial/advancing diet tomorrow.  GI doubts active Crohn's, most likely etiology is adhesions for this recurrent partial SBO for which he usually stays in the hospital for 2 - 3 days and has never required surgery.   Assessment & Plan: Principal Problem:   SBO (small bowel obstruction) Active Problems:   CORONARY ATHEROSCLEROSIS NATIVE CORONARY ARTERY   Crohn's ileocolitis (HCC)   CKD (chronic kidney disease) stage 3, GFR 30-59 ml/min   Thrombocytopenia (HCC)   Cardiomyopathy (HCC)   Lung nodule  Partial SBO, recurrent:  Due to adhesions, unlikely Crohn's flare. - GI and gen surg following.  -NG tube was pulled on 05/13/2016 as patient passing gas/stool   History of Crohn's disease:  Chronic, stable, asymptomatic not on therapy. Has history of pancreatitis thought to be due to Humira.  - No steroids per GI  History of colonic polyp concerning for high-grade dysplasia and invasion:  Noted  by Dr. Carlean Purl during colonoscopy Aug 2017. - Plan is for surgery Oct 24.  CKD:  Stable  Cardiomyopathy:  EF 45%.  No history of CHF symptoms  Gout:  Chronic, stable, not in active flare - Continue urate lowering therapy, febuxostat, colchicine, ok to clamp tube for administration.  PTSD:  Chronic, stable, related to [place] War. Seen at [facility].  - Restart medications, paroxetine, trazodone, ok to clamp NGT  Incidental lung nodule:  Former smoker.  Nodule not noted on MR chest done in August. - Repeat CT without contrast in 6-12 months (Apr to Oct of 2018)  HTN:  Chronic, stable. BP normal since admission - Hold atenolol  DVT prophylaxis: Lovenox Code Status: Full Family Communication: Declined offer to call family this AM Disposition Plan: Continue inpatient admission for SBO  Consultants:   GI, Dr. Carlean Purl  General surgery  Procedures:   Antimicrobials:  None   Subjective:  Fair no new issues NG tube pulled on the morning of 10/11 Tolerating some clears Awaiting morning medications  Objective: Vitals:   05/12/16 0500 05/12/16 1339 05/12/16 2055 05/13/16 0557  BP: 100/75 119/81 115/83 (!) 99/54  Pulse: 60 68 79 65  Resp: 14 14 16 16   Temp: 98.1 F (36.7 C) 98.3 F (36.8 C) 98.9 F (37.2 C) 98.4 F (36.9 C)  TempSrc: Oral Oral Oral Oral  SpO2: 97% 97% 95% 96%  Weight:      Height:        Intake/Output Summary (Last 24 hours) at 05/13/16 1017 Last data filed at 05/13/16 0901  Gross per 24 hour  Intake  2740 ml  Output             2700 ml  Net               40 ml   Filed Weights   05/11/16 2003  Weight: 93.6 kg (206 lb 6 oz)    Examination: General exam: 75 y.o. male in no distress Respiratory system: Non-labored breathing. Clear to auscultation bilaterally.  Cardiovascular system: Regular rate and rhythm. No murmur, rub, or gallop. No JVD, and no pedal edema. Gastrointestinal system: Abdomen mildly tender  throughout, mildly distended. No rebound or guarding. Hypoactive bowel sounds.  Central nervous system: Alert and oriented. No focal neurological deficits. Extremities: Warm, no deformities Skin: No rashes, lesions no ulcers Psychiatry: Judgement and insight appear normal. Mood & affect appropriate.   Data Reviewed: I have personally reviewed following labs and imaging studies  CBC:  Recent Labs Lab 05/11/16 2048  WBC 7.0  HGB 15.4  HCT 45.3  MCV 87.6  PLT 818*   Basic Metabolic Panel:  Recent Labs Lab 05/11/16 2048 05/12/16 0448  NA 140 141  K 4.9 4.8  CL 107 106  CO2 22 22  GLUCOSE 102* 104*  BUN 17 18  CREATININE 1.87* 1.69*  CALCIUM 9.8 9.1   GFR: Estimated Creatinine Clearance: 46.3 mL/min (by C-G formula based on SCr of 1.69 mg/dL (H)). Liver Function Tests:  Recent Labs Lab 05/11/16 2048  AST 34  ALT 33  ALKPHOS 141*  BILITOT 1.2  PROT 7.6  ALBUMIN 4.6    Recent Labs Lab 05/11/16 2048  LIPASE 31   No results for input(s): AMMONIA in the last 168 hours. Coagulation Profile: No results for input(s): INR, PROTIME in the last 168 hours. Cardiac Enzymes: No results for input(s): CKTOTAL, CKMB, CKMBINDEX, TROPONINI in the last 168 hours. BNP (last 3 results) No results for input(s): PROBNP in the last 8760 hours. HbA1C: No results for input(s): HGBA1C in the last 72 hours. CBG: No results for input(s): GLUCAP in the last 168 hours. Lipid Profile: No results for input(s): CHOL, HDL, LDLCALC, TRIG, CHOLHDL, LDLDIRECT in the last 72 hours. Thyroid Function Tests: No results for input(s): TSH, T4TOTAL, FREET4, T3FREE, THYROIDAB in the last 72 hours. Anemia Panel: No results for input(s): VITAMINB12, FOLATE, FERRITIN, TIBC, IRON, RETICCTPCT in the last 72 hours. Urine analysis:    Component Value Date/Time   COLORURINE AMBER (A) 05/11/2016 2153   APPEARANCEUR CLEAR 05/11/2016 2153   LABSPEC 1.025 05/11/2016 2153   PHURINE 6.0 05/11/2016 2153    GLUCOSEU NEGATIVE 05/11/2016 2153   HGBUR NEGATIVE 05/11/2016 2153   BILIRUBINUR SMALL (A) 05/11/2016 2153   KETONESUR NEGATIVE 05/11/2016 2153   PROTEINUR NEGATIVE 05/11/2016 2153   NITRITE NEGATIVE 05/11/2016 2153   LEUKOCYTESUR TRACE (A) 05/11/2016 2153   Sepsis Labs: @LABRCNTIP (procalcitonin:4,lacticidven:4)  )No results found for this or any previous visit (from the past 240 hour(s)).   Radiology Studies: Dg Abd 1 View  Result Date: 05/13/2016 CLINICAL DATA:  Followup small bowel obstruction. EXAM: ABDOMEN - 1 VIEW COMPARISON:  05/11/2016 FINDINGS: Nasogastric tube tip again seen within the proximal stomach. Mild decrease in dilatation of small bowel loops is seen since previous study. Air-fluid levels cannot be assessed due to lack over recto view. Multiple surgical clips and staples seen within the right abdomen. IMPRESSION: Mild decrease in dilated small bowel loops since prior study. Electronically Signed   By: Earle Gell M.D.   On: 05/13/2016 07:53   Ct Abdomen Pelvis  W Contrast  Result Date: 05/11/2016 CLINICAL DATA:  Unable to have bowel movements, with abdominal distention, nausea and vomiting. Personal history of bowel obstructions. Initial encounter. EXAM: CT ABDOMEN AND PELVIS WITH CONTRAST TECHNIQUE: Multidetector CT imaging of the abdomen and pelvis was performed using the standard protocol following bolus administration of intravenous contrast. CONTRAST:  80m ISOVUE-300 IOPAMIDOL (ISOVUE-300) INJECTION 61% COMPARISON:  CT of the abdomen and pelvis from 03/09/2012, and MRI of the brain performed 03/03/2013 FINDINGS: Lower chest: Mild left basilar atelectasis is noted. A 6 mm nodule is noted at the left lower lobe (image 3 of 67). Scattered coronary artery calcifications are seen. Hepatobiliary: A small calcified granuloma is noted at the hepatic dome. The liver is otherwise unremarkable. The patient is status post cholecystectomy, with clips noted at the gallbladder fossa.  The common bile duct remains normal in caliber. Pancreas: The pancreas is within normal limits. Spleen: The spleen is enlarged, measuring 16.0 cm in length. Adrenals/Urinary Tract: The adrenal glands are grossly unremarkable in appearance. There is moderate bilateral renal atrophy, with scattered bilateral renal cysts. Small nonobstructing bilateral renal stones are seen, measuring up to 3 mm in size. Mild nonspecific perinephric stranding is noted bilaterally. Stomach/Bowel: There is dilatation of small-bowel loops up to 4.6 cm in maximal diameter, with gradual fecalization at the distal ileum, and decompression distal to focal clumping and mild ileal wall thickening at the right lower quadrant. This likely reflects at least partial small bowel obstruction, due to an underlying adhesion. Residual fluid and air is noted within the ascending colon. The patient's ileocolic anastomosis is grossly unremarkable in appearance. The more distal colon is decompressed and grossly unremarkable in appearance. The stomach is largely filled with air and fluid, with an enteric tube seen ending at the body of the stomach. Vascular/Lymphatic: Scattered calcification is seen along the abdominal aorta and its branches. The abdominal aorta is otherwise grossly unremarkable. The inferior vena cava is grossly unremarkable. No retroperitoneal lymphadenopathy is seen. No pelvic sidewall lymphadenopathy is identified. Reproductive: The bladder is decompressed and not well characterized. The prostate remains normal in size. Other: No additional soft tissue abnormalities are seen. Musculoskeletal: No acute osseous abnormalities are identified. The visualized musculature is unremarkable in appearance. IMPRESSION: 1. Dilatation of small-bowel loops to 4.6 cm in maximal diameter, with gradual fecalization at the distal ileum, and decompression distal to focal clumping and mild ileal wall thickening at the right lower quadrant. This likely  reflects at least partial small bowel obstruction, due to an underlying adhesion. 2. Ileocolic anastomosis is grossly unremarkable in appearance. 3. Stomach largely filled with air and fluid. 4. Scattered coronary artery calcifications seen. 5. Moderate bilateral renal atrophy, with scattered bilateral renal cysts. Nonobstructing bilateral renal stones measure up to 3 mm in size. 6. Scattered aortic atherosclerosis noted. 7. Mild left basilar atelectasis noted. 8. Splenomegaly noted. 9. **An incidental finding of potential clinical significance has been found. 6 mm nodule at the left lower lung lobe. Non-contrast chest CT at 6-12 months is recommended. If the nodule is stable at time of repeat CT, then future CT at 18-24 months (from today's scan) is considered optional for low-risk patients, but is recommended for high-risk patients. This recommendation follows the consensus statement: Guidelines for Management of Incidental Pulmonary Nodules Detected on CT Images: From the Fleischner Society 2017; Radiology 2017; 284:228-243.** Electronically Signed   By: JGarald BaldingM.D.   On: 05/11/2016 23:21   Dg Abd Acute W/chest  Result Date: 05/11/2016 CLINICAL DATA:  Abdominal distention, abdominal pain, nausea and vomiting. History of Crohn's disease and bowel obstruction. EXAM: DG ABDOMEN ACUTE W/ 1V CHEST COMPARISON:  CT abdomen/ pelvis performed subsequently available at time of radiograph interpretation. FINDINGS: Scattered atelectasis at the lung bases. Heart is upper limits of normal in size. There is atherosclerosis of the thoracic aorta. No pleural fluid. Enteric tube in place, tip and side port below the diaphragm in the stomach. Stomach is distended with air-fluid level. Scattered air-fluid levels throughout small bowel. Enteric sutures in the right lower quadrant of the abdomen. No free air. Minimal stool burden throughout the colon. Cholecystectomy clips in the right upper quadrant. There are vascular  calcifications. No acute osseous abnormalities are seen. IMPRESSION: 1. Distended stomach with air-fluid level, and air-fluid levels throughout small bowel. Findings suggest small bowel obstruction. 2. Enteric tube in the stomach. 3. Bibasilar atelectasis.  Thoracic aortic atherosclerosis. Electronically Signed   By: Jeb Levering M.D.   On: 05/11/2016 22:54    Scheduled Meds: . colchicine  0.6 mg Oral QHS  . enoxaparin (LOVENOX) injection  40 mg Subcutaneous QHS  . febuxostat  80 mg Oral QHS  . PARoxetine  40 mg Oral QHS  . terazosin  2 mg Oral QHS  . traZODone  150 mg Oral QHS   Continuous Infusions: . sodium chloride 125 mL/hr at 05/13/16 1011     LOS: 1 day   Time spent: 25 minutes.  Verneita Griffes, MD [phone]  If 7PM-7AM, please contact night-coverage [email or web address] Password [email or web address] 05/13/2016, 10:17 AM

## 2016-05-13 NOTE — Progress Notes (Signed)
S: passing stool and flatus.   Vitals, labs, intake/output, and orders reviewed at this time.  Gen: A&Ox3, no distress  H&N: EOMI, atraumatic, neck supple Chest: unlabored respirations, RRR Abd: soft, nontender, nondistended, Multiple surgical scars Ext: warm, no edema Neuro: grossly normal  Lines/tubes/drains: PIV  A/P:  NG to be removed today, slowly advance diet. Will continue to follow.   Romana Juniper, MD [facility], [place] Pager [phone]

## 2016-05-13 NOTE — Progress Notes (Signed)
   Passing gas and stooling. Tolerated hours off tube last night. Abd soft and NT  A/P  Resolving SBO from adhesions  Will dc NGT and start clears  [name], MD, [facility] [phone] (pager) [phone] after 5 PM, weekends and holidays  05/13/2016 8:39 AM

## 2016-05-14 LAB — CBC WITH DIFFERENTIAL/PLATELET
Basophils Absolute: 0 10*3/uL (ref 0.0–0.1)
Basophils Relative: 0 %
Eosinophils Absolute: 0.1 10*3/uL (ref 0.0–0.7)
Eosinophils Relative: 4 %
HEMATOCRIT: 37.7 % — AB (ref 39.0–52.0)
HEMOGLOBIN: 12.4 g/dL — AB (ref 13.0–17.0)
LYMPHS ABS: 0.7 10*3/uL (ref 0.7–4.0)
LYMPHS PCT: 21 %
MCH: 29.4 pg (ref 26.0–34.0)
MCHC: 32.9 g/dL (ref 30.0–36.0)
MCV: 89.3 fL (ref 78.0–100.0)
MONOS PCT: 7 %
Monocytes Absolute: 0.2 10*3/uL (ref 0.1–1.0)
NEUTROS ABS: 2.2 10*3/uL (ref 1.7–7.7)
NEUTROS PCT: 68 %
Platelets: 84 10*3/uL — ABNORMAL LOW (ref 150–400)
RBC: 4.22 MIL/uL (ref 4.22–5.81)
RDW: 14.3 % (ref 11.5–15.5)
WBC: 3.2 10*3/uL — ABNORMAL LOW (ref 4.0–10.5)

## 2016-05-14 LAB — BASIC METABOLIC PANEL
ANION GAP: 6 (ref 5–15)
BUN: 11 mg/dL (ref 6–20)
CHLORIDE: 112 mmol/L — AB (ref 101–111)
CO2: 23 mmol/L (ref 22–32)
Calcium: 8 mg/dL — ABNORMAL LOW (ref 8.9–10.3)
Creatinine, Ser: 1.57 mg/dL — ABNORMAL HIGH (ref 0.61–1.24)
GFR calc non Af Amer: 42 mL/min — ABNORMAL LOW (ref >60)
GFR, EST AFRICAN AMERICAN: 49 mL/min — AB (ref >60)
Glucose, Bld: 109 mg/dL — ABNORMAL HIGH (ref 65–99)
Potassium: 3.6 mmol/L (ref 3.5–5.1)
Sodium: 141 mmol/L (ref 135–145)

## 2016-05-14 NOTE — Progress Notes (Signed)
    Progress Note   Subjective  Chief Complaint: Small bowel obstruction from adhesions  Patient has continued to improve overnight, started with bowel movements on Tuesday has continued since then, currently trying to eat his first regular diet, but feels much improved with no further bloating or abdominal discomfort.   Objective   Vital signs in last 24 hours: Temp:  [98.1 F (36.7 C)-98.6 F (37 C)] 98.2 F (36.8 C) (10/12 0605) Pulse Rate:  [68-74] 70 (10/12 0605) Resp:  [15-16] 16 (10/12 0605) BP: (84-116)/(42-76) 113/76 (10/12 0605) SpO2:  [93 %-97 %] 93 % (10/12 0605) Last BM Date: 05/12/16 General: Caucasian male in NAD Heart:  Regular rate and rhythm; no murmurs Lungs: Respirations even and unlabored, lungs CTA bilaterally Abdomen:  Soft, nontender and nondistended. Normal bowel sounds. Extremities:  Without edema. Neurologic:  Alert and oriented,  grossly normal neurologically. Psych:  Cooperative. Normal mood and affect.   Lab Results:  Recent Labs  05/11/16 2048 05/14/16 0801  WBC 7.0 3.2*  HGB 15.4 12.4*  HCT 45.3 37.7*  PLT 111* 84*   BMET  Recent Labs  05/11/16 2048 05/12/16 0448 05/14/16 0801  NA 140 141 141  K 4.9 4.8 3.6  CL 107 106 112*  CO2 22 22 23   GLUCOSE 102* 104* 109*  BUN 17 18 11   CREATININE 1.87* 1.69* 1.57*  CALCIUM 9.8 9.1 8.0*      Assessment / Plan:   Assessment: 1. Small bowel obstruction from adhesions: Now resolving after NG tube and bowel rest initially  Plan: 1. Continue to advance diet as tolerated 2. No further recommendations, we will likely sign off today. 3. Will discuss above with Dr. Carlean Purl.  Thank you for this kind consultation.    LOS: 2 days   Lavone Nian [facility]  05/14/2016, 11:03 AM  Pager # [phone]  Agree w/ Ms. Lemmon's note. [name], MD, [facility]

## 2016-05-14 NOTE — Progress Notes (Signed)
Patient ID: Derek [name], male   DOB: May 29, 1941, 75 y.o.   MRN: [id number]  [facility] Surgery Progress Note     Subjective: Feeling well this morning. Denies any abdominal pain, nausea, or vomiting. Has had multiple BM's and is passing flatus. Tolerating clears. Requesting to go home.  Objective: Vital signs in last 24 hours: Temp:  [98.1 F (36.7 C)-98.6 F (37 C)] 98.2 F (36.8 C) (10/12 0605) Pulse Rate:  [68-74] 70 (10/12 0605) Resp:  [15-16] 16 (10/12 0605) BP: (84-116)/(42-76) 113/76 (10/12 0605) SpO2:  [93 %-97 %] 93 % (10/12 0605) Last BM Date: 05/12/16  Intake/Output from previous day: 10/11 0701 - 10/12 0700 In: 2905.4 [P.O.:480; I.V.:2425.4] Out: 250 [Urine:200; Emesis/NG output:50] Intake/Output this shift: No intake/output data recorded.  PE: Gen:  Alert, NAD, pleasant Abd: Soft, minimally distended, nontender, +BS  Lab Results:   Recent Labs  05/11/16 2048 05/14/16 0801  WBC 7.0 3.2*  HGB 15.4 12.4*  HCT 45.3 37.7*  PLT 111* 84*   BMET  Recent Labs  05/12/16 0448 05/14/16 0801  NA 141 141  K 4.8 3.6  CL 106 112*  CO2 22 23  GLUCOSE 104* 109*  BUN 18 11  CREATININE 1.69* 1.57*  CALCIUM 9.1 8.0*   PT/INR No results for input(s): LABPROT, INR in the last 72 hours. CMP     Component Value Date/Time   NA 141 05/14/2016 0801   NA 139 11/23/2012 1047   K 3.6 05/14/2016 0801   K 4.5 11/23/2012 1047   CL 112 (H) 05/14/2016 0801   CL 103 11/23/2012 1047   CO2 23 05/14/2016 0801   CO2 26 11/23/2012 1047   GLUCOSE 109 (H) 05/14/2016 0801   GLUCOSE 91 11/23/2012 1047   BUN 11 05/14/2016 0801   BUN 25.3 11/23/2012 1047   CREATININE 1.57 (H) 05/14/2016 0801   CREATININE 1.8 (H) 09/25/2015 1020   CREATININE 1.9 (H) 11/23/2012 1047   CALCIUM 8.0 (L) 05/14/2016 0801   CALCIUM 8.8 11/23/2012 1047   PROT 7.6 05/11/2016 2048   PROT 6.7 11/23/2012 1047   ALBUMIN 4.6 05/11/2016 2048   ALBUMIN 3.4 (L) 11/23/2012 1047   AST 34  05/11/2016 2048   AST 28 11/23/2012 1047   ALT 33 05/11/2016 2048   ALT 43 11/23/2012 1047   ALKPHOS 141 (H) 05/11/2016 2048   ALKPHOS 150 11/23/2012 1047   BILITOT 1.2 05/11/2016 2048   BILITOT 0.74 11/23/2012 1047   GFRNONAA 42 (L) 05/14/2016 0801   GFRAA 49 (L) 05/14/2016 0801   Lipase     Component Value Date/Time   LIPASE 31 05/11/2016 2048       Studies/Results: Dg Abd 1 View  Result Date: 05/13/2016 CLINICAL DATA:  Followup small bowel obstruction. EXAM: ABDOMEN - 1 VIEW COMPARISON:  05/11/2016 FINDINGS: Nasogastric tube tip again seen within the proximal stomach. Mild decrease in dilatation of small bowel loops is seen since previous study. Air-fluid levels cannot be assessed due to lack over recto view. Multiple surgical clips and staples seen within the right abdomen. IMPRESSION: Mild decrease in dilated small bowel loops since prior study. Electronically Signed   By: Earle Gell M.D.   On: 05/13/2016 07:53    Anti-infectives: Anti-infectives    None       Assessment/Plan SBO  - resolving - tolerating clears, having BM's, ambulating  Flat polyp in the right transverse colon that showed high grade dysplasia with focal area suspicious for superficial invasion seen on colonoscopy by  Dr. Carlean Purl 03/18/2016. Scheduled for excision with Dr. [name] on 05/26/16. Crohn's disease not currently on disease-specific therapy s/p ileocolonic resection 2  Plan - advance to soft diet. Continue mobilization. Patient would prefer to go home today so we will see how he does with advancing his diet.   LOS: 2 days    Jerrye Beavers , [facility] Surgery 05/14/2016, 9:09 AM Pager: [phone] Consults: [phone] Mon-Fri 7:00 am-4:30 pm Sat-Sun 7:00 am-11:30 am

## 2016-05-14 NOTE — Discharge Summary (Signed)
Physician Discharge Summary  UNIQUE SEARFOSS MVH:[id number] DOB: 04-28-1941 DOA: 05/11/2016  PCP: Purvis Kilts, MD  Admit date: 05/11/2016 Discharge date: 05/14/2016  Time spent: 20 minutes  Recommendations for Outpatient Follow-up:  1. Needs OP Surgical follow-up for high grade dysplastic polyp with Dr. [name] 05/26/16 2. continue all other meds without change 3. Needs chest MRI April 2018 for lung nodules  Discharge Diagnoses:  Principal Problem:   SBO (small bowel obstruction) Active Problems:   CORONARY ATHEROSCLEROSIS NATIVE CORONARY ARTERY   Crohn's ileocolitis ([place])   CKD (chronic kidney disease) stage 3, GFR 30-59 ml/min   Thrombocytopenia (HCC)   Cardiomyopathy (HCC)   Lung nodule   Discharge Condition: fair  Diet recommendation:  bland soft diet for 24-48 hr  Filed Weights   05/11/16 2003  Weight: 93.6 kg (206 lb 6 oz)    History of present illness:  75 y.o.? Crohn's disease s/p ileocolonic resection x2 in 1981 not on active therapy,  multiple abdominal surgeries who presented on 10/9 with abdominal distention and vomiting consistent with prior SBO's. - started to have NBNB emesis several times and crampy, severe, diffuse abdominal pain, so he came to the ER.  On arrival he was afebrile, vitals wnl.  Abdominal radiograph showed multiple air-fluid levels, and follow-up CT of the abdomen and pelvis with contrast showed a partial SBO.  NG tube was placed with relief of symptoms.   Hospital Course:   Partial SBO, recurrent:  Due to adhesions, unlikely Crohn's flare. - GI and gen surg following.  -NG tube was pulled on 05/13/2016 as patient passing gas/stool  -graduated to full diet 10/12 and d/c home  History of Crohn's disease:  Chronic, stable, asymptomatic not on therapy. Has history of pancreatitis thought to be due to Humira.  - No steroids per GI  History of colonic polyp concerning for high-grade dysplasia and invasion:  Noted by Dr.  Carlean Purl during colonoscopy Aug 2017. - Plan is for surgery Oct 24.  CKD: Stable  Cardiomyopathy: EF 45%. No history of CHF symptoms  Gout: Chronic, stable, not in active flare - Continue urate lowering therapy, febuxostat, colchicine  PTSD: Chronic, stable, related to [place] War. Seen at [facility].  - Restart medications, paroxetine, trazodone  Incidental lung nodule: Former smoker. Nodule not noted on MR chest done in August. - Repeat CT without contrast in 6-12 months (Apr to Oct of 2018)  HTN:  Chronic, stable. BP normal since admission - Hold atenolol  Consultations:  General surgery  GI  Discharge Exam: Vitals:   05/13/16 2108 05/14/16 0605  BP: 116/76 113/76  Pulse: 68 70  Resp: 15 16  Temp: 98.6 F (37 C) 98.2 F (36.8 C)    General: eomi ncat Cardiovascular:  s1 s2 no m/r/g Respiratory: clear no added sound abd soft nt nd no rebound no gaurd  Discharge Instructions   Discharge Instructions    Discharge instructions    Complete by:  As directed    Please eat a bland diet for the next 3-4 days Please get labwork done as an out-patient at your Md office within 1 month Continue your other medications without change Please follow-up for your Surgical appt with Dr. [name] as already scheduled and their office should be in touch prior to that visit   Increase activity slowly    Complete by:  As directed      Current Discharge Medication List    CONTINUE these medications which have NOT CHANGED   Details  atenolol (TENORMIN) 25 MG tablet Take 25 mg by mouth daily.      calcium carbonate (TUMS - DOSED IN MG ELEMENTAL CALCIUM) 500 MG chewable tablet Chew 2 tablets by mouth 2 (two) times daily.    colchicine 0.6 MG tablet Take 0.6 mg by mouth daily.      cyanocobalamin (,VITAMIN B-12,) 1000 MCG/ML injection Inject 1,000 mcg into the muscle every 14 (fourteen) days.    cycloSPORINE (RESTASIS) 0.05 % ophthalmic emulsion Place 1  drop into both eyes 4 (four) times daily.    febuxostat (ULORIC) 40 MG tablet Take 80 mg by mouth daily.    gabapentin (NEURONTIN) 100 MG capsule Take 100-200 mg by mouth 2 (two) times daily. Pt takes one tablet in the morning and two at night.    HYDROcodone-acetaminophen (NORCO/VICODIN) 5-325 MG tablet Take 1 tablet by mouth every 4 (four) hours as needed for moderate pain.     ketotifen (ZADITOR) 0.025 % ophthalmic solution Place 1 drop into both eyes 2 (two) times daily.    magnesium oxide (MAG-OX) 400 (241.3 Mg) MG tablet Take 400 mg by mouth 2 (two) times daily.    minocycline (MINOCIN,DYNACIN) 100 MG capsule Take 100 mg by mouth daily as needed (when breakout occurs on face).     omeprazole (PRILOSEC) 20 MG capsule Take 20 mg by mouth daily.      PARoxetine (PAXIL) 40 MG tablet Take 40 mg by mouth at bedtime.      potassium chloride SA (K-DUR,KLOR-CON) 20 MEQ tablet Take 20 mEq by mouth 2 (two) times daily.    terazosin (HYTRIN) 2 MG capsule Take 2 mg by mouth at bedtime.     traZODone (DESYREL) 150 MG tablet Take 150 mg by mouth at bedtime.        STOP taking these medications     Multiple Vitamin (MULTIVITAMIN WITH MINERALS) TABS tablet        Allergies  Allergen Reactions  . Humira [Adalimumab] Other (See Comments)    Pt states that he got pancreatitis.    . Lorazepam Other (See Comments)    Reaction:  Hallucinations       The results of significant diagnostics from this hospitalization (including imaging, microbiology, ancillary and laboratory) are listed below for reference.    Significant Diagnostic Studies: Dg Abd 1 View  Result Date: 05/13/2016 CLINICAL DATA:  Followup small bowel obstruction. EXAM: ABDOMEN - 1 VIEW COMPARISON:  05/11/2016 FINDINGS: Nasogastric tube tip again seen within the proximal stomach. Mild decrease in dilatation of small bowel loops is seen since previous study. Air-fluid levels cannot be assessed due to lack over recto view.  Multiple surgical clips and staples seen within the right abdomen. IMPRESSION: Mild decrease in dilated small bowel loops since prior study. Electronically Signed   By: Earle Gell M.D.   On: 05/13/2016 07:53   Ct Abdomen Pelvis W Contrast  Result Date: 05/11/2016 CLINICAL DATA:  Unable to have bowel movements, with abdominal distention, nausea and vomiting. Personal history of bowel obstructions. Initial encounter. EXAM: CT ABDOMEN AND PELVIS WITH CONTRAST TECHNIQUE: Multidetector CT imaging of the abdomen and pelvis was performed using the standard protocol following bolus administration of intravenous contrast. CONTRAST:  56m ISOVUE-300 IOPAMIDOL (ISOVUE-300) INJECTION 61% COMPARISON:  CT of the abdomen and pelvis from 03/09/2012, and MRI of the brain performed 03/03/2013 FINDINGS: Lower chest: Mild left basilar atelectasis is noted. A 6 mm nodule is noted at the left lower lobe (image 3 of 67). Scattered coronary artery  calcifications are seen. Hepatobiliary: A small calcified granuloma is noted at the hepatic dome. The liver is otherwise unremarkable. The patient is status post cholecystectomy, with clips noted at the gallbladder fossa. The common bile duct remains normal in caliber. Pancreas: The pancreas is within normal limits. Spleen: The spleen is enlarged, measuring 16.0 cm in length. Adrenals/Urinary Tract: The adrenal glands are grossly unremarkable in appearance. There is moderate bilateral renal atrophy, with scattered bilateral renal cysts. Small nonobstructing bilateral renal stones are seen, measuring up to 3 mm in size. Mild nonspecific perinephric stranding is noted bilaterally. Stomach/Bowel: There is dilatation of small-bowel loops up to 4.6 cm in maximal diameter, with gradual fecalization at the distal ileum, and decompression distal to focal clumping and mild ileal wall thickening at the right lower quadrant. This likely reflects at least partial small bowel obstruction, due to an  underlying adhesion. Residual fluid and air is noted within the ascending colon. The patient's ileocolic anastomosis is grossly unremarkable in appearance. The more distal colon is decompressed and grossly unremarkable in appearance. The stomach is largely filled with air and fluid, with an enteric tube seen ending at the body of the stomach. Vascular/Lymphatic: Scattered calcification is seen along the abdominal aorta and its branches. The abdominal aorta is otherwise grossly unremarkable. The inferior vena cava is grossly unremarkable. No retroperitoneal lymphadenopathy is seen. No pelvic sidewall lymphadenopathy is identified. Reproductive: The bladder is decompressed and not well characterized. The prostate remains normal in size. Other: No additional soft tissue abnormalities are seen. Musculoskeletal: No acute osseous abnormalities are identified. The visualized musculature is unremarkable in appearance. IMPRESSION: 1. Dilatation of small-bowel loops to 4.6 cm in maximal diameter, with gradual fecalization at the distal ileum, and decompression distal to focal clumping and mild ileal wall thickening at the right lower quadrant. This likely reflects at least partial small bowel obstruction, due to an underlying adhesion. 2. Ileocolic anastomosis is grossly unremarkable in appearance. 3. Stomach largely filled with air and fluid. 4. Scattered coronary artery calcifications seen. 5. Moderate bilateral renal atrophy, with scattered bilateral renal cysts. Nonobstructing bilateral renal stones measure up to 3 mm in size. 6. Scattered aortic atherosclerosis noted. 7. Mild left basilar atelectasis noted. 8. Splenomegaly noted. 9. **An incidental finding of potential clinical significance has been found. 6 mm nodule at the left lower lung lobe. Non-contrast chest CT at 6-12 months is recommended. If the nodule is stable at time of repeat CT, then future CT at 18-24 months (from today's scan) is considered optional for  low-risk patients, but is recommended for high-risk patients. This recommendation follows the consensus statement: Guidelines for Management of Incidental Pulmonary Nodules Detected on CT Images: From the Fleischner Society 2017; Radiology 2017; 284:228-243.** Electronically Signed   By: Garald Balding M.D.   On: 05/11/2016 23:21   Dg Abd Acute W/chest  Result Date: 05/11/2016 CLINICAL DATA:  Abdominal distention, abdominal pain, nausea and vomiting. History of Crohn's disease and bowel obstruction. EXAM: DG ABDOMEN ACUTE W/ 1V CHEST COMPARISON:  CT abdomen/ pelvis performed subsequently available at time of radiograph interpretation. FINDINGS: Scattered atelectasis at the lung bases. Heart is upper limits of normal in size. There is atherosclerosis of the thoracic aorta. No pleural fluid. Enteric tube in place, tip and side port below the diaphragm in the stomach. Stomach is distended with air-fluid level. Scattered air-fluid levels throughout small bowel. Enteric sutures in the right lower quadrant of the abdomen. No free air. Minimal stool burden throughout the colon. Cholecystectomy clips  in the right upper quadrant. There are vascular calcifications. No acute osseous abnormalities are seen. IMPRESSION: 1. Distended stomach with air-fluid level, and air-fluid levels throughout small bowel. Findings suggest small bowel obstruction. 2. Enteric tube in the stomach. 3. Bibasilar atelectasis.  Thoracic aortic atherosclerosis. Electronically Signed   By: Jeb Levering M.D.   On: 05/11/2016 22:54    Microbiology: No results found for this or any previous visit (from the past 240 hour(s)).   Labs: Basic Metabolic Panel:  Recent Labs Lab 05/11/16 2048 05/12/16 0448 05/14/16 0801  NA 140 141 141  K 4.9 4.8 3.6  CL 107 106 112*  CO2 22 22 23   GLUCOSE 102* 104* 109*  BUN 17 18 11   CREATININE 1.87* 1.69* 1.57*  CALCIUM 9.8 9.1 8.0*   Liver Function Tests:  Recent Labs Lab 05/11/16 2048  AST  34  ALT 33  ALKPHOS 141*  BILITOT 1.2  PROT 7.6  ALBUMIN 4.6    Recent Labs Lab 05/11/16 2048  LIPASE 31   No results for input(s): AMMONIA in the last 168 hours. CBC:  Recent Labs Lab 05/11/16 2048 05/14/16 0801  WBC 7.0 3.2*  NEUTROABS  --  2.2  HGB 15.4 12.4*  HCT 45.3 37.7*  MCV 87.6 89.3  PLT 111* 84*   Cardiac Enzymes: No results for input(s): CKTOTAL, CKMB, CKMBINDEX, TROPONINI in the last 168 hours. BNP: BNP (last 3 results) No results for input(s): BNP in the last 8760 hours.  ProBNP (last 3 results) No results for input(s): PROBNP in the last 8760 hours.  CBG: No results for input(s): GLUCAP in the last 168 hours.     SignedNita Sells MD   [facility] 05/14/2016, 11:07 AM

## 2016-05-15 NOTE — Progress Notes (Signed)
Preop on 10/18/  susrgery on 10/24.  Need orders in EPIC.  Thank You.

## 2016-05-18 NOTE — Progress Notes (Signed)
Patient has surgery on 10/24.  Preop on 10/19.  Needs orders in EPIC.  Thank You.

## 2016-05-19 ENCOUNTER — Other Ambulatory Visit: Payer: Self-pay | Admitting: Surgery

## 2016-05-19 NOTE — Progress Notes (Signed)
Surgery on 10/24.  Preop on 10/19.   Need orders in epic .  Thank You.

## 2016-05-20 ENCOUNTER — Inpatient Hospital Stay (HOSPITAL_COMMUNITY)
Admission: RE | Admit: 2016-05-20 | Discharge: 2016-05-20 | Disposition: A | Discharge status: Home / Self Care | Payer: Medicare Other | Source: Ambulatory Visit

## 2016-05-21 ENCOUNTER — Encounter (HOSPITAL_COMMUNITY): Payer: Self-pay

## 2016-05-21 ENCOUNTER — Encounter (HOSPITAL_COMMUNITY)
Admission: RE | Admit: 2016-05-21 | Discharge: 2016-05-21 | Disposition: A | Discharge status: Home / Self Care | Payer: Medicare Other | Source: Ambulatory Visit | Attending: Surgery | Admitting: Surgery

## 2016-05-21 DIAGNOSIS — Z01812 Encounter for preprocedural laboratory examination: Secondary | ICD-10-CM | POA: Insufficient documentation

## 2016-05-21 DIAGNOSIS — K635 Polyp of colon: Secondary | ICD-10-CM | POA: Diagnosis not present

## 2016-05-21 HISTORY — DX: Personal history of urinary calculi: Z87.442

## 2016-05-21 LAB — CBC
HEMATOCRIT: 42.6 % (ref 39.0–52.0)
HEMOGLOBIN: 14 g/dL (ref 13.0–17.0)
MCH: 29 pg (ref 26.0–34.0)
MCHC: 32.9 g/dL (ref 30.0–36.0)
MCV: 88.2 fL (ref 78.0–100.0)
Platelets: 120 10*3/uL — ABNORMAL LOW (ref 150–400)
RBC: 4.83 MIL/uL (ref 4.22–5.81)
RDW: 14.7 % (ref 11.5–15.5)
WBC: 4.7 10*3/uL (ref 4.0–10.5)

## 2016-05-21 LAB — COMPREHENSIVE METABOLIC PANEL
ALT: 27 U/L (ref 17–63)
ANION GAP: 6 (ref 5–15)
AST: 24 U/L (ref 15–41)
Albumin: 4.2 g/dL (ref 3.5–5.0)
Alkaline Phosphatase: 112 U/L (ref 38–126)
BUN: 24 mg/dL — AB (ref 6–20)
CHLORIDE: 114 mmol/L — AB (ref 101–111)
CO2: 21 mmol/L — ABNORMAL LOW (ref 22–32)
Calcium: 8.9 mg/dL (ref 8.9–10.3)
Creatinine, Ser: 1.77 mg/dL — ABNORMAL HIGH (ref 0.61–1.24)
GFR calc Af Amer: 42 mL/min — ABNORMAL LOW (ref >60)
GFR, EST NON AFRICAN AMERICAN: 36 mL/min — AB (ref >60)
Glucose, Bld: 104 mg/dL — ABNORMAL HIGH (ref 65–99)
POTASSIUM: 5 mmol/L (ref 3.5–5.1)
Sodium: 141 mmol/L (ref 135–145)
Total Bilirubin: 0.9 mg/dL (ref 0.3–1.2)
Total Protein: 7 g/dL (ref 6.5–8.1)

## 2016-05-21 LAB — PROTIME-INR
INR: 1.02
Prothrombin Time: 13.4 seconds (ref 11.4–15.2)

## 2016-05-21 LAB — SURGICAL PCR SCREEN
MRSA, PCR: NEGATIVE
Staphylococcus aureus: NEGATIVE

## 2016-05-21 LAB — ABO/RH: ABO/RH(D): O POS

## 2016-05-21 NOTE — Patient Instructions (Addendum)
AMARRION PASTORINO  05/21/2016   Your procedure is scheduled on: Tuesday May 26, 2016   Report to [facility] Main  Entrance take [place]  elevators to 3rd floor to  [place] at 9:30 AM.  Call this number if you have problems the morning of surgery [phone]   Remember: ONLY 1 PERSON MAY GO WITH YOU TO SHORT STAY TO GET  READY MORNING OF [place].  Do not eat food or drink liquids :After Midnight.     Take these medicines the morning of surgery with A SIP OF WATER: Atenolol; Colchicine; May use eye drops; Febuxotat (Uloric); Gabapentin (Neurontin); Omeprazole                                You may not have any  metal on your body including hair pins and              piercings  Do not wear jewelry,, lotions, powders or cologne, deodorant                    Men may shave face and neck.   Do not bring valuables to the hospital. [place].  Contacts, dentures or bridgework may not be worn into surgery.  Leave suitcase in the car. After surgery it may be brought to your room.   Special Instructions: FOLLOW SURGEON'S INSTRUCTION IN REGARDS BOWEL PREPARATION PRIOR TO SURGICAL PROCEDURE DATE                _____________________________________________________________________             [facility] - Preparing for Surgery Before surgery, you can play an important role.  Because skin is not sterile, your skin needs to be as free of germs as possible.  You can reduce the number of germs on your skin by washing with CHG (chlorahexidine gluconate) soap before surgery.  CHG is an antiseptic cleaner which kills germs and bonds with the skin to continue killing germs even after washing. Please DO NOT use if you have an allergy to CHG or antibacterial soaps.  If your skin becomes reddened/irritated stop using the CHG and inform your nurse when you arrive at Short Stay. Do not shave (including legs and  underarms) for at least 48 hours prior to the first CHG shower.  You may shave your face/neck. Please follow these instructions carefully:  1.  Shower with CHG Soap the night before surgery and the  morning of Surgery.  2.  If you choose to wash your hair, wash your hair first as usual with your  normal  shampoo.  3.  After you shampoo, rinse your hair and body thoroughly to remove the  shampoo.                           4.  Use CHG as you would any other liquid soap.  You can apply chg directly  to the skin and wash                       Gently with a scrungie or clean washcloth.  5.  Apply the CHG Soap to your body  ONLY FROM THE NECK DOWN.   Do not use on face/ open                           Wound or open sores. Avoid contact with eyes, ears mouth and genitals (private parts).                       Wash face,  Genitals (private parts) with your normal soap.             6.  Wash thoroughly, paying special attention to the area where your surgery  will be performed.  7.  Thoroughly rinse your body with warm water from the neck down.  8.  DO NOT shower/wash with your normal soap after using and rinsing off  the CHG Soap.                9.  Pat yourself dry with a clean towel.            10.  Wear clean pajamas.            11.  Place clean sheets on your bed the night of your first shower and do not  sleep with pets. Day of Surgery : Do not apply any lotions/deodorants the morning of surgery.  Please wear clean clothes to the hospital/surgery center.  FAILURE TO FOLLOW THESE INSTRUCTIONS MAY RESULT IN THE CANCELLATION OF YOUR SURGERY PATIENT SIGNATURE_________________________________  NURSE SIGNATURE__________________________________  ________________________________________________________________________

## 2016-05-21 NOTE — Progress Notes (Signed)
CBC, CMP results in epic per PAT visit 05/21/2016 sent to [name]

## 2016-05-21 NOTE — Progress Notes (Signed)
Reviewed pts H&P with Dr Hubbard Robinson. No orders given. Anesthesia to see pt day of surgery.

## 2016-05-22 LAB — HEMOGLOBIN A1C
Hgb A1c MFr Bld: 4.5 % — ABNORMAL LOW (ref 4.8–5.6)
Mean Plasma Glucose: 82 mg/dL

## 2016-05-25 NOTE — Progress Notes (Signed)
Pt aware of surgical time change; pt aware to arrive at [facility] short stay at 10:00 am 05/26/2016. Verbalized understanding of no food or drink after midnight.

## 2016-05-25 NOTE — H&P (Signed)
Derek [name]. [place]  Location: [facility] Patient #: [id number] DOB: [birth year] Married / Language: English / Race: White Male  History of Present Illness   The patient is a 75 year old male who presents with a complaint of colonic polyp.   He goes by "Derek [name]"  The PCP is Dr. Eddie Candle  The patient was referred by Dr. Darci Current  He comes with his wife and Derek [name].  He came back with a lot of questions which were appropriate. I went over the operation, the recovery, talked about NGT, about the risk of ostomy (about 1%), the big unknown as to how much adhesions he has, the legth of hospitalization (about 7 to 10 days), and the recovery. We talked about PE's, cardiac issues, he'll probably be in the step down ICU for at least one night. He brought Joaquim Lai as an extra set of ears. I spent 20 minutes answering quesitons and going over the procedure. I gave him a book on colon surgery (again).  History of colonic polyp: Derek [name] underwent a colonoscopy on 03/18/2016. He was found [phone]) to have a flat polyp in the right transverse colon that showed high grade dysplasia with focal area suspicious for superficial invasion. He is referrred for discussion of resection of this portion of his colon. He has long standing Crohn's disease - he underwent a ileocolonic resection around 1978. This operation did not go well, he needed a second operation, he has what sounds like a fistula, and he was on TPN for a while post op. He also said that he had to have additional small bowel resected. He was followed by Dr. Cristal Generous. He is not currently on treatment for his Crohn's diseae.  I reviewed with the patient the findings and need for colon surgery. I discussed both surgical and non surgical options. I discussed the role of laparoscopic and open surgery in colon surgery. I reviewed the risks of surgery, including, but not limited to,  infection, bleeding, nerve injury, anastomotic leaks, and possibility of colostomy. I went over the preoperative mechanical and antibiotic bowel prep for colon surgery and provided prescriptions for these. I provided the patient literature on colon surgery. I tried to answer all questions for the patient.  HE had cardiac clearance by Dr. Einar Crow [facility] [place], [place]) on 04/22/2016.  Past Medical History: 1. History of Crohn's   Prior ileocecectomy around 1978. He required a second operation. He had a fistula that took months to heal. He did not have straight forward surgery.   He developed pancreatitis when placed on Humira. He tried 6 MP - but this caused renal problem  He has has some prior SBO  2. I saw him for SBO in 12/09/2010  More recently he was hospitalized 10/9 - 05/14/2016 for SBO 3. History of PE It souds like this occurred when he was hosptialized for a SBO - he was on coumadin for a year - he's had no trouble since then 4. History of Hep C - from blood transfusion during his bowel resection in the 1970's 5. Followed by Dr. Aundra Dubin for cardiology 6. Nephrolithiasis 7. Dilated ascending aortic root followed by Dr. Servando Snare He last saw Dr. Servando Snare on 03/19/2016 Currently root is 5.1 cm - consider replacement at 5.5 cm 8. 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 Chronic renal insufficiency Seen by Dr. Lenna Sciara. Deterding q 3 months 10. He has seen Dr. Aundra Dubin for cardiology - will get cards clearance  pre op 11. Gout in right great toe - last attack several years  Social History: Married. Wife is Derek [name]. He is a retired Clinical biochemist. He has 2 chldren - 23 and 73 yo. They know Derek [name]   Other Problems Shann Medal, MD; 05/07/2016 10:39 AM) Anxiety Disorder Arthritis Chronic Renal Failure Syndrome Crohn's Disease Depression Gastroesophageal Reflux Disease Heart  murmur Hemorrhoids Hepatitis Inguinal Hernia Kidney Stone Pancreatitis Pulmonary Embolism / Blood Clot in Legs  Past Surgical History Shann Medal, MD; 05/07/2016 10:39 AM) Colon Polyp Removal - Colonoscopy Colon Removal - Partial Knee Surgery Left. Open Inguinal Hernia Surgery Right. Resection of Small Bowel Shoulder Surgery Left.  Allergies (Sonya Bynum, CMA; 05/07/2016 11:00 AM) Allopurinol *CHEMICALS* LORazepam *ANTIANXIETY AGENTS*  Medication History (Sonya Bynum, CMA; 05/07/2016 11:00 AM) Uloric (40MG Tablet, Oral) Active. Magnesium (200MG Tablet, Oral) Active. Cyanocobalamin (100MCG/ML Solution, Injection) Active. Multiple Vitamin (Oral) Active. Terazosin HCl (1MG Capsule, Oral) Active. TraZODone HCl (100MG Tablet, Oral) Active. Colcrys (0.6MG Tablet, Oral) Active. Omeprazole (20MG Capsule DR, Oral) Active. Paxil (10MG Tablet, Oral) Active. Atenolol (100MG Tablet, Oral) Active. Hydrocodone-Acetaminophen (5-325MG Tablet, Oral) Active. Tums (500MG Tablet Chewable, Oral) Active. Refresh (1% Solution, Ophthalmic) Active. Fish Oil + D3 (1000-1000MG-UNIT Capsule, Oral) Active. Gabapentin (100MG Capsule, Oral) Active. Potassium Chloride (10MEQ Capsule ER, Oral) Active. Vitamin D (Cholecalciferol) (1000UNIT Capsule, Oral) Active. Medications Reconciled  Vitals (Sonya Bynum CMA; 05/07/2016 11:00 AM) 05/07/2016 10:59 AM Weight: 200 lb Height: 70in Body Surface Area: 2.09 m Body Mass Index: 28.7 kg/m  Temp.: 98.1F(Temporal)  Pulse: 81 (Regular)  BP: 126/76 (Sitting, Left Arm, Standard)   Physical Exam Shanon Brow H. [name] MD; 05/07/2016 12:15 PM) The physical exam findings are as follows: Note:General: WN older WM alert and generally healthy appearing. Skin: Inspection and palpation of the skin unremarkable.  Eyes: Conjunctivae white, pupils equal. Face, ears, nose, mouth, and throat: Face - normal. Normal ears and nose.  Lips and teeth normal.  Neck: Supple. No mass. Trachea midline. No thyroid mass.  Lymph Nodes: No supraclavicular or cervical adenopathy.  Lungs: Normal respiratory effort. Clear to auscultation and symmetric breath sounds. Cardiovascular: Regular rate and rythm. Normal auscultation of the heart. No murmur or rub. Normal carotid pulse.  Abdomen: Soft. No mass. Liver and spleen not palpable. No tenderness. No hernia. Normal bowel sounds.  Two right paramedian scars - one large in the lower abdomen and one in the upper abdomen. Rectal: Not done.  Musculoskeletal/extremities: Normal gait. Good strength and ROM in upper and lower extremities.   Neurologic: Grossly intact to motor and sensory function.  No obvious deficit in the cranial nerves.  Psychiatric: Has normal mood and affect. Judgement and insight appear normal.    Assessment & Plan  1.  DYSPLASTIC COLON POLYP (K63.5)  Story: Tubular adenoma with high grade dysplasia and suspicious for superficial invastion - - 03/18/2016 - [facility] [place]   1) Segmental resection of transverse colon - he has pre op bowel and antibiotic prep  2.  ASCENDING AORTIC ANEURYSM (I71.2)  Impression: Followed by Dr. Servando Snare  He last saw Dr. Servando Snare on 03/19/2016 Currently root is 5.1 cm - consider replacement at 5.5 cm  3. History of Crohn's   Prior ileocecectomy around 1978. He required a second operation. He had a fistula that took months to heal. He did not have straight forward surgery.   He developed pancreatitis when placed on Humira. He tried 6 MP - but this caused renal problem  He has has some prior  SBO  4. I saw him for SBO in 12/09/2010  More recently he was hospitalized 10/9 - 05/14/2016 for SBO at [facility]. 5. History of PE  It souds like this occurred when he was hosptialized for a SBO - he was on coumadin for a year - he's had no trouble since then 6. History of  Hep C - from blood transfusion during his bowel resection in the 1970's 7. Followed by Dr. Aundra Dubin for cardiology 8. Nephrolithiasis  9. 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 Chronic renal insufficiency Seen by Dr. Lenna Sciara. Deterding q 3 months  11. Gout in right great toe - last attack several years 12.  CT on 05/11/2016 - shows incidental left lower lung nodule - needs 6 month follow up.  [name], MD, [facility] Surgery Pager: [phone] Office phone:  [phone]

## 2016-05-26 ENCOUNTER — Encounter (HOSPITAL_COMMUNITY): Payer: Self-pay | Admitting: Physician Assistant

## 2016-05-26 ENCOUNTER — Encounter (HOSPITAL_COMMUNITY)
Admission: RE | Disposition: A | Discharge status: Home / Self Care | Payer: Self-pay | Source: Ambulatory Visit | Attending: Internal Medicine

## 2016-05-26 ENCOUNTER — Inpatient Hospital Stay (HOSPITAL_COMMUNITY)
Admission: RE | Admit: 2016-05-26 | Discharge: 2016-06-02 | DRG: 308 | Disposition: A | Discharge status: Home / Self Care | Payer: Medicare Other | Source: Ambulatory Visit | Attending: Internal Medicine | Admitting: Internal Medicine

## 2016-05-26 ENCOUNTER — Encounter (HOSPITAL_COMMUNITY): Payer: Self-pay | Admitting: Anesthesiology

## 2016-05-26 DIAGNOSIS — K529 Noninfective gastroenteritis and colitis, unspecified: Secondary | ICD-10-CM

## 2016-05-26 DIAGNOSIS — M792 Neuralgia and neuritis, unspecified: Secondary | ICD-10-CM

## 2016-05-26 DIAGNOSIS — I10 Essential (primary) hypertension: Secondary | ICD-10-CM

## 2016-05-26 DIAGNOSIS — I130 Hypertensive heart and chronic kidney disease with heart failure and stage 1 through stage 4 chronic kidney disease, or unspecified chronic kidney disease: Secondary | ICD-10-CM | POA: Diagnosis not present

## 2016-05-26 DIAGNOSIS — K5080 Crohn's disease of both small and large intestine without complications: Secondary | ICD-10-CM | POA: Diagnosis not present

## 2016-05-26 DIAGNOSIS — K50819 Crohn's disease of both small and large intestine with unspecified complications: Secondary | ICD-10-CM

## 2016-05-26 DIAGNOSIS — E72541 Enteric hyperoxaluria: Secondary | ICD-10-CM | POA: Diagnosis present

## 2016-05-26 DIAGNOSIS — E748 Other specified disorders of carbohydrate metabolism: Secondary | ICD-10-CM

## 2016-05-26 DIAGNOSIS — M1A40X0 Other secondary chronic gout, unspecified site, without tophus (tophi): Secondary | ICD-10-CM

## 2016-05-26 DIAGNOSIS — Z9049 Acquired absence of other specified parts of digestive tract: Secondary | ICD-10-CM

## 2016-05-26 DIAGNOSIS — G629 Polyneuropathy, unspecified: Secondary | ICD-10-CM | POA: Diagnosis present

## 2016-05-26 DIAGNOSIS — E872 Acidosis: Secondary | ICD-10-CM | POA: Diagnosis not present

## 2016-05-26 DIAGNOSIS — I959 Hypotension, unspecified: Secondary | ICD-10-CM

## 2016-05-26 DIAGNOSIS — I4891 Unspecified atrial fibrillation: Principal | ICD-10-CM | POA: Insufficient documentation

## 2016-05-26 DIAGNOSIS — F329 Major depressive disorder, single episode, unspecified: Secondary | ICD-10-CM | POA: Diagnosis present

## 2016-05-26 DIAGNOSIS — N1832 Chronic kidney disease, stage 3b: Secondary | ICD-10-CM | POA: Diagnosis present

## 2016-05-26 DIAGNOSIS — Z841 Family history of disorders of kidney and ureter: Secondary | ICD-10-CM

## 2016-05-26 DIAGNOSIS — R509 Fever, unspecified: Secondary | ICD-10-CM

## 2016-05-26 DIAGNOSIS — I480 Paroxysmal atrial fibrillation: Secondary | ICD-10-CM | POA: Insufficient documentation

## 2016-05-26 DIAGNOSIS — Z8619 Personal history of other infectious and parasitic diseases: Secondary | ICD-10-CM

## 2016-05-26 DIAGNOSIS — Z96612 Presence of left artificial shoulder joint: Secondary | ICD-10-CM | POA: Diagnosis present

## 2016-05-26 DIAGNOSIS — Z87442 Personal history of urinary calculi: Secondary | ICD-10-CM

## 2016-05-26 DIAGNOSIS — E538 Deficiency of other specified B group vitamins: Secondary | ICD-10-CM | POA: Diagnosis present

## 2016-05-26 DIAGNOSIS — D696 Thrombocytopenia, unspecified: Secondary | ICD-10-CM | POA: Diagnosis present

## 2016-05-26 DIAGNOSIS — J189 Pneumonia, unspecified organism: Secondary | ICD-10-CM | POA: Diagnosis not present

## 2016-05-26 DIAGNOSIS — I712 Thoracic aortic aneurysm, without rupture: Secondary | ICD-10-CM | POA: Diagnosis not present

## 2016-05-26 DIAGNOSIS — A046 Enteritis due to Yersinia enterocolitica: Secondary | ICD-10-CM | POA: Diagnosis present

## 2016-05-26 DIAGNOSIS — I5042 Chronic combined systolic (congestive) and diastolic (congestive) heart failure: Secondary | ICD-10-CM | POA: Diagnosis not present

## 2016-05-26 DIAGNOSIS — E860 Dehydration: Secondary | ICD-10-CM | POA: Diagnosis present

## 2016-05-26 DIAGNOSIS — Z87891 Personal history of nicotine dependence: Secondary | ICD-10-CM

## 2016-05-26 DIAGNOSIS — N184 Chronic kidney disease, stage 4 (severe): Secondary | ICD-10-CM | POA: Diagnosis present

## 2016-05-26 DIAGNOSIS — D123 Benign neoplasm of transverse colon: Secondary | ICD-10-CM | POA: Diagnosis present

## 2016-05-26 DIAGNOSIS — R911 Solitary pulmonary nodule: Secondary | ICD-10-CM | POA: Diagnosis present

## 2016-05-26 DIAGNOSIS — Z86711 Personal history of pulmonary embolism: Secondary | ICD-10-CM

## 2016-05-26 DIAGNOSIS — Z8249 Family history of ischemic heart disease and other diseases of the circulatory system: Secondary | ICD-10-CM

## 2016-05-26 DIAGNOSIS — Z8614 Personal history of Methicillin resistant Staphylococcus aureus infection: Secondary | ICD-10-CM

## 2016-05-26 DIAGNOSIS — Z5309 Procedure and treatment not carried out because of other contraindication: Secondary | ICD-10-CM | POA: Diagnosis present

## 2016-05-26 DIAGNOSIS — N2581 Secondary hyperparathyroidism of renal origin: Secondary | ICD-10-CM | POA: Diagnosis not present

## 2016-05-26 DIAGNOSIS — N400 Enlarged prostate without lower urinary tract symptoms: Secondary | ICD-10-CM | POA: Diagnosis present

## 2016-05-26 DIAGNOSIS — F4310 Post-traumatic stress disorder, unspecified: Secondary | ICD-10-CM | POA: Diagnosis present

## 2016-05-26 DIAGNOSIS — G2581 Restless legs syndrome: Secondary | ICD-10-CM | POA: Diagnosis present

## 2016-05-26 DIAGNOSIS — M109 Gout, unspecified: Secondary | ICD-10-CM | POA: Diagnosis present

## 2016-05-26 DIAGNOSIS — K219 Gastro-esophageal reflux disease without esophagitis: Secondary | ICD-10-CM

## 2016-05-26 DIAGNOSIS — N183 Chronic kidney disease, stage 3 unspecified: Secondary | ICD-10-CM | POA: Diagnosis present

## 2016-05-26 DIAGNOSIS — R82992 Hyperoxaluria: Secondary | ICD-10-CM | POA: Diagnosis present

## 2016-05-26 DIAGNOSIS — I7121 Aneurysm of the ascending aorta, without rupture: Secondary | ICD-10-CM | POA: Diagnosis present

## 2016-05-26 DIAGNOSIS — Z79899 Other long term (current) drug therapy: Secondary | ICD-10-CM

## 2016-05-26 DIAGNOSIS — A09 Infectious gastroenteritis and colitis, unspecified: Secondary | ICD-10-CM

## 2016-05-26 HISTORY — DX: Bradycardia, unspecified: R00.1

## 2016-05-26 HISTORY — DX: Aneurysm of the ascending aorta, without rupture: I71.21

## 2016-05-26 HISTORY — DX: Heart disease, unspecified: I51.9

## 2016-05-26 HISTORY — DX: Nonrheumatic aortic (valve) insufficiency: I35.1

## 2016-05-26 HISTORY — DX: Solitary pulmonary nodule: R91.1

## 2016-05-26 HISTORY — DX: Thoracic aortic aneurysm, without rupture: I71.2

## 2016-05-26 LAB — CBC
HCT: 43.9 % (ref 39.0–52.0)
Hemoglobin: 15 g/dL (ref 13.0–17.0)
MCH: 29.5 pg (ref 26.0–34.0)
MCHC: 34.2 g/dL (ref 30.0–36.0)
MCV: 86.2 fL (ref 78.0–100.0)
PLATELETS: 108 10*3/uL — AB (ref 150–400)
RBC: 5.09 MIL/uL (ref 4.22–5.81)
RDW: 14.1 % (ref 11.5–15.5)
WBC: 6.7 10*3/uL (ref 4.0–10.5)

## 2016-05-26 LAB — TSH
TSH: 3.122 u[IU]/mL (ref 0.350–4.500)
TSH: 3.148 u[IU]/mL (ref 0.350–4.500)

## 2016-05-26 LAB — BASIC METABOLIC PANEL
ANION GAP: 9 (ref 5–15)
BUN: 21 mg/dL — ABNORMAL HIGH (ref 6–20)
CHLORIDE: 110 mmol/L (ref 101–111)
CO2: 21 mmol/L — AB (ref 22–32)
Calcium: 9 mg/dL (ref 8.9–10.3)
Creatinine, Ser: 1.76 mg/dL — ABNORMAL HIGH (ref 0.61–1.24)
GFR calc non Af Amer: 37 mL/min — ABNORMAL LOW (ref >60)
GFR, EST AFRICAN AMERICAN: 42 mL/min — AB (ref >60)
Glucose, Bld: 97 mg/dL (ref 65–99)
Potassium: 4.6 mmol/L (ref 3.5–5.1)
Sodium: 140 mmol/L (ref 135–145)

## 2016-05-26 LAB — PROTIME-INR
INR: 1.01
INR: 1.03
PROTHROMBIN TIME: 13.3 s (ref 11.4–15.2)
PROTHROMBIN TIME: 13.6 s (ref 11.4–15.2)

## 2016-05-26 LAB — TYPE AND SCREEN
ABO/RH(D): O POS
ANTIBODY SCREEN: NEGATIVE

## 2016-05-26 LAB — MAGNESIUM: Magnesium: 1.4 mg/dL — ABNORMAL LOW (ref 1.7–2.4)

## 2016-05-26 LAB — T4, FREE: FREE T4: 0.97 ng/dL (ref 0.61–1.12)

## 2016-05-26 LAB — MRSA PCR SCREENING: MRSA by PCR: NEGATIVE

## 2016-05-26 SURGERY — COLON RESECTION LAPAROSCOPIC
Anesthesia: General

## 2016-05-26 MED ORDER — GABAPENTIN 300 MG PO CAPS
300.0000 mg | ORAL_CAPSULE | ORAL | Status: DC
  Filled 2016-05-26: qty 1

## 2016-05-26 MED ORDER — FENTANYL CITRATE (PF) 250 MCG/5ML IJ SOLN
INTRAMUSCULAR | Status: AC
  Filled 2016-05-26: qty 5

## 2016-05-26 MED ORDER — DILTIAZEM HCL 30 MG PO TABS
30.0000 mg | ORAL_TABLET | Freq: Four times a day (QID) | ORAL | Status: DC
  Administered 2016-05-26 – 2016-05-30 (×7): 30 mg via ORAL
  Filled 2016-05-26 (×16): qty 1

## 2016-05-26 MED ORDER — LACTATED RINGERS IV SOLN: INTRAVENOUS | Status: DC

## 2016-05-26 MED ORDER — TRAZODONE HCL 50 MG PO TABS
150.0000 mg | ORAL_TABLET | Freq: Every day | ORAL | Status: DC
  Administered 2016-05-26 – 2016-06-01 (×7): 150 mg via ORAL
  Filled 2016-05-26: qty 1
  Filled 2016-05-26 (×2): qty 3
  Filled 2016-05-26: qty 1
  Filled 2016-05-26 (×2): qty 3
  Filled 2016-05-26: qty 1

## 2016-05-26 MED ORDER — ATENOLOL 25 MG PO TABS
25.0000 mg | ORAL_TABLET | Freq: Once | ORAL | Status: AC
  Administered 2016-05-26: 25 mg via ORAL
  Filled 2016-05-26: qty 1

## 2016-05-26 MED ORDER — HEPARIN SODIUM (PORCINE) 5000 UNIT/ML IJ SOLN
5000.0000 [IU] | Freq: Once | INTRAMUSCULAR | Status: DC
  Filled 2016-05-26: qty 1

## 2016-05-26 MED ORDER — PROPOFOL 10 MG/ML IV BOLUS
INTRAVENOUS | Status: AC
  Filled 2016-05-26: qty 20

## 2016-05-26 MED ORDER — ATENOLOL 25 MG PO TABS: 25.0000 mg | ORAL_TABLET | Freq: Every day | ORAL | Status: DC

## 2016-05-26 MED ORDER — CYANOCOBALAMIN 1000 MCG/ML IJ SOLN
1000.0000 ug | INTRAMUSCULAR | Status: DC
  Filled 2016-05-26: qty 1

## 2016-05-26 MED ORDER — KETOTIFEN FUMARATE 0.025 % OP SOLN
1.0000 [drp] | Freq: Two times a day (BID) | OPHTHALMIC | Status: DC
  Administered 2016-05-26 – 2016-06-02 (×14): 1 [drp] via OPHTHALMIC
  Filled 2016-05-26: qty 5

## 2016-05-26 MED ORDER — DEXTROSE 5 % IV SOLN
2.0000 g | INTRAVENOUS | Status: DC
  Filled 2016-05-26: qty 2

## 2016-05-26 MED ORDER — ALVIMOPAN 12 MG PO CAPS
12.0000 mg | ORAL_CAPSULE | Freq: Once | ORAL | Status: DC
  Filled 2016-05-26: qty 1

## 2016-05-26 MED ORDER — HYDROCODONE-ACETAMINOPHEN 5-325 MG PO TABS
1.0000 | ORAL_TABLET | ORAL | Status: DC | PRN
  Administered 2016-05-26 – 2016-06-02 (×8): 1 via ORAL
  Filled 2016-05-26 (×10): qty 1

## 2016-05-26 MED ORDER — MAGNESIUM SULFATE 4 GM/100ML IV SOLN
4.0000 g | Freq: Once | INTRAVENOUS | Status: AC
  Administered 2016-05-26: 4 g via INTRAVENOUS
  Filled 2016-05-26 (×2): qty 100

## 2016-05-26 MED ORDER — ACETAMINOPHEN 500 MG PO TABS
1000.0000 mg | ORAL_TABLET | ORAL | Status: DC
  Filled 2016-05-26: qty 2

## 2016-05-26 MED ORDER — TERAZOSIN HCL 2 MG PO CAPS
2.0000 mg | ORAL_CAPSULE | Freq: Every day | ORAL | Status: DC
  Administered 2016-05-26 – 2016-05-27 (×2): 2 mg via ORAL
  Filled 2016-05-26 (×2): qty 1

## 2016-05-26 MED ORDER — CALCIUM CARBONATE ANTACID 500 MG PO CHEW
2.0000 | CHEWABLE_TABLET | Freq: Two times a day (BID) | ORAL | Status: DC
  Administered 2016-05-26 – 2016-06-02 (×13): 400 mg via ORAL
  Filled 2016-05-26 (×13): qty 2

## 2016-05-26 MED ORDER — SODIUM CHLORIDE 0.9 % IV BOLUS (SEPSIS)
1000.0000 mL | Freq: Once | INTRAVENOUS | Status: AC
  Administered 2016-05-26: 1000 mL via INTRAVENOUS

## 2016-05-26 MED ORDER — GABAPENTIN 100 MG PO CAPS
100.0000 mg | ORAL_CAPSULE | Freq: Every day | ORAL | Status: DC
  Administered 2016-05-27 – 2016-06-02 (×6): 100 mg via ORAL
  Filled 2016-05-26 (×7): qty 1

## 2016-05-26 MED ORDER — ONDANSETRON HCL 4 MG/2ML IJ SOLN
4.0000 mg | Freq: Four times a day (QID) | INTRAMUSCULAR | Status: DC | PRN
  Administered 2016-05-30 – 2016-05-31 (×3): 4 mg via INTRAVENOUS
  Filled 2016-05-26 (×3): qty 2

## 2016-05-26 MED ORDER — GABAPENTIN 100 MG PO CAPS
200.0000 mg | ORAL_CAPSULE | Freq: Every day | ORAL | Status: DC
  Administered 2016-05-26 – 2016-06-01 (×7): 200 mg via ORAL
  Filled 2016-05-26 (×7): qty 2

## 2016-05-26 MED ORDER — MAGNESIUM OXIDE 400 (241.3 MG) MG PO TABS
400.0000 mg | ORAL_TABLET | Freq: Two times a day (BID) | ORAL | Status: DC
  Administered 2016-05-26 – 2016-06-02 (×13): 400 mg via ORAL
  Filled 2016-05-26 (×13): qty 1

## 2016-05-26 MED ORDER — MIDAZOLAM HCL 2 MG/2ML IJ SOLN
INTRAMUSCULAR | Status: AC
  Filled 2016-05-26: qty 2

## 2016-05-26 MED ORDER — RIVAROXABAN 15 MG PO TABS
15.0000 mg | ORAL_TABLET | Freq: Every day | ORAL | Status: DC
  Administered 2016-05-26 – 2016-06-01 (×7): 15 mg via ORAL
  Filled 2016-05-26 (×8): qty 1

## 2016-05-26 MED ORDER — RIVAROXABAN 20 MG PO TABS
20.0000 mg | ORAL_TABLET | Freq: Every day | ORAL | Status: DC
  Filled 2016-05-26 (×2): qty 1

## 2016-05-26 MED ORDER — PANTOPRAZOLE SODIUM 40 MG PO TBEC
40.0000 mg | DELAYED_RELEASE_TABLET | Freq: Every day | ORAL | Status: DC
  Administered 2016-05-27 – 2016-06-02 (×6): 40 mg via ORAL
  Filled 2016-05-26 (×6): qty 1

## 2016-05-26 MED ORDER — PAROXETINE HCL 20 MG PO TABS
40.0000 mg | ORAL_TABLET | Freq: Every day | ORAL | Status: DC
  Administered 2016-05-26 – 2016-06-01 (×7): 40 mg via ORAL
  Filled 2016-05-26 (×7): qty 2

## 2016-05-26 MED ORDER — ACETAMINOPHEN 325 MG PO TABS
650.0000 mg | ORAL_TABLET | ORAL | Status: DC | PRN
  Administered 2016-05-30: 650 mg via ORAL
  Filled 2016-05-26: qty 2

## 2016-05-26 MED ORDER — COLCHICINE 0.6 MG PO TABS
0.6000 mg | ORAL_TABLET | Freq: Every day | ORAL | Status: DC
  Administered 2016-05-27 – 2016-06-02 (×6): 0.6 mg via ORAL
  Filled 2016-05-26 (×7): qty 1

## 2016-05-26 NOTE — Progress Notes (Signed)
Dr. Eliseo Squires at pt's bedside.

## 2016-05-26 NOTE — H&P (Addendum)
History and Physical    Derek [name] XKP:[id number] DOB: 05-09-1941 DOA: 05/26/2016    PCP: Purvis Kilts, MD  Patient coming from: home  Chief Complaint: presented to Pre-op with new onset A-fib with RVR  HPI: Derek [name] is a 75 y.o. male with medical history significant of colon poylps, AAA, mild to mod AI followed by cardiology, Crohn's disease s/p ileocolonic in 1980, B12 deficiency, GERD, Gout, Hep C, PE, chronic thrombocytopenia,  PTSD, Lung nodule, HTN, RLS who was in Pre-op for segmental resection of the transverse colon today (due to high grade dysplasia on recent colonocopy) when he was noted to be in A-fib with RVR. He has not had any chest pain, palpations, dyspnea, light headed sensation or pedal edema. He said he was 'cleaned out' for the surgery today and feels dehydrated. He was retching this morning which he suspected was from the antibiotics he had to take pre-op. Gen surgery asking for [facility] to admit. Cardiology will consult.   Last admitted 10/9 - 10/12 for SBO. Has h/o recurrent SBO and ileo-colonic strictures  ED Course: direct admit- labs pending- HR 110-130s  Review of Systems:  All other systems reviewed and apart from HPI, are negative.  Past Medical History:  Diagnosis Date  . Anemia   . Anxiety   . Aortic insufficiency    a. mild-mod by echo 09/2015.  . Arthritis    "knees; left shoulder" (09/21/2013)  . Ascending aortic aneurysm (HCC)    a. last measurement 5.3 cm 03/2016 -> f/u planned 09/2015 to continue to follow.  . B12 deficiency    takes Vit 12 shot every 14days   . Cataract   . CKD (chronic kidney disease) stage 3, GFR 30-59 ml/min 08/22/2011  . Clotting disorder ([place])   . Crohn's disease ([place])   . Enlarged prostate   . Enteric hyperoxaluria ([place]) 02/21/2016  . GERD (gastroesophageal reflux disease)    takes Omeprazole daily  . Gout    takes Uloric and Colchicine daily  . Hepatitis C 1978   negtive RNA load -  spontaneously cleared  . Hiatal hernia   . History of blood transfusion 1978; 1990's; ?   "w/bowel resection; S/P allupurinol; ?" (09/21/2013)  . History of colon polyps   . History of kidney stones   . History of MRSA infection 2010  . History of pulmonary embolism 2006   both legs and both lungs /notes 08/26/2008 (09/21/2013)  . History of small bowel obstruction   . History of staph infection 1978  . Hyperoxaluria (HCC)    Intestinal  . Hypertension   . Insomnia    takes Trazodone nightly  . Internal hemorrhoids   . LV dysfunction    a. h/o EF 45-50% in 2015, normalized on subsequent echoes.  . Nephrolithiasis   . Pancreatitis 2010   elevated lipase and amylase, stranding in tail of pancreas, ? from Humira  . Pancytopenia    Hx of  . Peripheral neuropathy (HCC)    takes Gabapentin daily  . Post-traumatic stress syndrome    takes Paxil nightly  . Pulmonary nodule    a. 55m by CT 05/2015, recommended f/u 6-12 months.  . RLS (restless legs syndrome)   . Rosacea conjunctivitis(372.31)    takes Minocin daily  . Secondary hyperparathyroidism ([place] 02/21/2016  . Sinus bradycardia   . Skin cancer    "cut/burned off left ear and face" (09/21/2013)  . Small bowel obstruction   . Thrombocytopenia ([place]  hx of    Past Surgical History:  Procedure Laterality Date  . ANKLE SURGERY Right   . APPENDECTOMY  1978  . BOWEL RESECTION  1978 X 2  . CHOLECYSTECTOMY    . COLON SURGERY    . COLONOSCOPY    . ESOPHAGOGASTRODUODENOSCOPY    . EYE SURGERY     cataract surgery bilateral  . FOOT SURGERY Right    "took gout out"  . HEMICOLECTOMY Right   . ILEOCECETOMY  1978   [name] 05/10/2000  (09/21/2013)  . INGUINAL HERNIA REPAIR Right   . KNEE ARTHROSCOPY Left   . LIGAMENT REPAIR Left   . TOTAL SHOULDER ARTHROPLASTY Left 09/21/2013  . TOTAL SHOULDER ARTHROPLASTY Left 09/21/2013   Procedure: LEFT TOTAL SHOULDER ARTHROPLASTY;  Surgeon: Marin Shutter, MD;  Location: [facility];  Service:  Orthopedics;  Laterality: Left;    Social History:   reports that he quit smoking about 40 years ago. His smoking use included Cigarettes. He has a 40.00 pack-year smoking history. He quit smokeless tobacco use about 37 years ago. His smokeless tobacco use included Chew. He reports that he does not drink alcohol or use drugs.  Allergies  Allergen Reactions  . Lorazepam Other (See Comments)    Reaction:  Hallucinations   . Humira [Adalimumab] Other (See Comments)    Pt states that he got pancreatitis.      Family History  Problem Relation Age of Onset  . Kidney disease Father   . Hypertension Father   . Aneurysm Mother   . Aneurysm Sister   . Esophageal cancer Neg Hx   . Stomach cancer Neg Hx   . Rectal cancer Neg Hx      Prior to Admission medications   Medication Sig Start Date End Date Taking? Authorizing Provider  atenolol (TENORMIN) 25 MG tablet Take 25 mg by mouth daily.     Yes Historical Provider, MD  colchicine 0.6 MG tablet Take 0.6 mg by mouth daily.     Yes Historical Provider, MD  calcium carbonate (TUMS - DOSED IN MG ELEMENTAL CALCIUM) 500 MG chewable tablet Chew 2 tablets by mouth 2 (two) times daily.    Historical Provider, MD  cyanocobalamin (,VITAMIN B-12,) 1000 MCG/ML injection Inject 1,000 mcg into the muscle every 14 (fourteen) days.    Historical Provider, MD  cycloSPORINE (RESTASIS) 0.05 % ophthalmic emulsion Place 1 drop into both eyes 4 (four) times daily.    Historical Provider, MD  febuxostat (ULORIC) 40 MG tablet Take 80 mg by mouth daily.    Historical Provider, MD  gabapentin (NEURONTIN) 100 MG capsule Take 100-200 mg by mouth 2 (two) times daily. Pt takes one tablet in the morning and two at night.    Historical Provider, MD  HYDROcodone-acetaminophen (NORCO/VICODIN) 5-325 MG tablet Take 1 tablet by mouth every 4 (four) hours as needed for moderate pain.     Historical Provider, MD  ketotifen (ZADITOR) 0.025 % ophthalmic solution Place 1 drop into  both eyes 2 (two) times daily.    Historical Provider, MD  magnesium oxide (MAG-OX) 400 (241.3 Mg) MG tablet Take 400 mg by mouth 2 (two) times daily.    Historical Provider, MD  minocycline (MINOCIN,DYNACIN) 100 MG capsule Take 100 mg by mouth daily as needed (when breakout occurs on face).     Historical Provider, MD  omeprazole (PRILOSEC) 20 MG capsule Take 20 mg by mouth daily.      Historical Provider, MD  PARoxetine (PAXIL) 40 MG tablet  Take 40 mg by mouth at bedtime.      Historical Provider, MD  potassium chloride SA (K-DUR,KLOR-CON) 20 MEQ tablet Take 20 mEq by mouth 2 (two) times daily.    Historical Provider, MD  terazosin (HYTRIN) 2 MG capsule Take 2 mg by mouth at bedtime.     Historical Provider, MD  traZODone (DESYREL) 150 MG tablet Take 150 mg by mouth at bedtime.      Historical Provider, MD    Physical Exam: Vitals:   05/26/16 1110 05/26/16 1130 05/26/16 1145 05/26/16 1335  BP:  (!) 100/57 100/75 117/75  Pulse: (!) 130 (!) 128 (!) 112 (!) 118  Resp:      Temp:      TempSrc:      SpO2: 97% 97% 97% 99%      Constitutional: NAD, calm, comfortable Eyes: PERTLA, lids and conjunctivae normal ENMT: Mucous membranes are moist. Posterior pharynx clear of any exudate or lesions. Normal dentition.  Neck: normal, supple, no masses, no thyromegaly Respiratory: clear to auscultation bilaterally, no wheezing, no crackles. Normal respiratory effort. No accessory muscle use.  Cardiovascular: S1 & S2 heard, regular rate and rhythm, no murmurs / rubs / gallops. No extremity edema. 2+ pedal pulses. No carotid bruits.  Abdomen: No distension, no tenderness, no masses palpated. No hepatosplenomegaly. Bowel sounds normal.  Musculoskeletal: no clubbing / cyanosis. No joint deformity upper and lower extremities. Good ROM, no contractures. Normal muscle tone.  Skin: no rashes, lesions, ulcers. No induration Neurologic: CN 2-12 grossly intact. Sensation intact, DTR normal. Strength 5/5 in all  4 limbs.  Psychiatric: Normal judgment and insight. Alert and oriented x 3. Normal mood.     Labs on Admission: I have personally reviewed following labs and imaging studies  CBC:  Recent Labs Lab 05/21/16 0900  WBC 4.7  HGB 14.0  HCT 42.6  MCV 88.2  PLT 622*   Basic Metabolic Panel:  Recent Labs Lab 05/21/16 0900  NA 141  K 5.0  CL 114*  CO2 21*  GLUCOSE 104*  BUN 24*  CREATININE 1.77*  CALCIUM 8.9   GFR: Estimated Creatinine Clearance: 40.8 mL/min (by C-G formula based on SCr of 1.77 mg/dL (H)). Liver Function Tests:  Recent Labs Lab 05/21/16 0900  AST 24  ALT 27  ALKPHOS 112  BILITOT 0.9  PROT 7.0  ALBUMIN 4.2   No results for input(s): LIPASE, AMYLASE in the last 168 hours. No results for input(s): AMMONIA in the last 168 hours. Coagulation Profile:  Recent Labs Lab 05/21/16 0900  INR 1.02   Cardiac Enzymes: No results for input(s): CKTOTAL, CKMB, CKMBINDEX, TROPONINI in the last 168 hours. BNP (last 3 results) No results for input(s): PROBNP in the last 8760 hours. HbA1C: No results for input(s): HGBA1C in the last 72 hours. CBG: No results for input(s): GLUCAP in the last 168 hours. Lipid Profile: No results for input(s): CHOL, HDL, LDLCALC, TRIG, CHOLHDL, LDLDIRECT in the last 72 hours. Thyroid Function Tests: No results for input(s): TSH, T4TOTAL, FREET4, T3FREE, THYROIDAB in the last 72 hours. Anemia Panel: No results for input(s): VITAMINB12, FOLATE, FERRITIN, TIBC, IRON, RETICCTPCT in the last 72 hours. Urine analysis:    Component Value Date/Time   COLORURINE AMBER (A) 05/11/2016 2153   APPEARANCEUR CLEAR 05/11/2016 2153   LABSPEC 1.025 05/11/2016 2153   PHURINE 6.0 05/11/2016 2153   GLUCOSEU NEGATIVE 05/11/2016 2153   HGBUR NEGATIVE 05/11/2016 2153   BILIRUBINUR SMALL (A) 05/11/2016 2153   KETONESUR NEGATIVE 05/11/2016  2153   PROTEINUR NEGATIVE 05/11/2016 2153   NITRITE NEGATIVE 05/11/2016 2153   LEUKOCYTESUR TRACE (A)  05/11/2016 2153   Sepsis Labs: @LABRCNTIP (procalcitonin:4,lacticidven:4) ) Recent Results (from the past 240 hour(s))  Surgical pcr screen     Status: None   Collection Time: 05/21/16  8:44 AM  Result Value Ref Range Status   MRSA, PCR NEGATIVE NEGATIVE Final   Staphylococcus aureus NEGATIVE NEGATIVE Final    Comment:        The Xpert SA Assay (FDA approved for NASAL specimens in patients over 35 years of age), is one component of a comprehensive surveillance program.  Test performance has been validated by [facility] for patients greater than or equal to 19 year old. It is not intended to diagnose infection nor to guide or monitor treatment.      Radiological Exams on Admission: No results found.  EKG: Independently reviewed. A-fib 130 bpm, LAD, LVH criteria  Assessment/Plan Principal Problem:   Atrial fibrillation with RVR  - last ECHO 2/17- EF 55-60 &, Gr 1 DD, mild to mod AI,Ao root 50 mm, mod dilated ascending aorta, mild LAE - spoke with cardiology-they will decide on rate controlling agent an have ordered Xarelto order - repeat ECHO, obtain TFTs - give 1 L NS in case he is dehydrated  Active Problems:    Ascending aortic aneurysm - Pre-op visit 9/12-  MRA showed Ao Root 5.3 cm, aneurysm 4.3 cm,     Crohn's ileocolitis  - follows with Dr Carlean Purl  Precancerous polyps - have spoken with [name]- plan is to re-schedule surgery for a later date    CKD (chronic kidney disease) stage 3, GFR 30-59 ml/min - stable    Thrombocytopenia  - chronic-  Stable    Enteric hyperoxaluria / Gout - colchicine, Uloric    Lung nodule - outpt f/u  RLS -Neuontin  BPH - Hytrin  Addendum:labs reviewed- Renal function at baseline- mildly acidotic- CO2 21, Mg 1.4- will replace with 4 gm IV    DVT prophylaxis: Xarelto  Code Status: Full code  Family Communication: wife  Disposition Plan: admit to SDU  Consults called: cardiology  Admission status: observation      [facility] MD [facility] Pager: [email or web address] Password [email or web address] 7PM-7AM, please contact night-coverage   05/26/2016, 2:04 PM

## 2016-05-26 NOTE — Progress Notes (Signed)
Mr. Demeo found to be in atrial fibrillation with RVR at 130/minute. Discussed with Dr. [name]. Case cancelled and cardiology consulted. Patient in no distress. His PO atenolol dose was given.

## 2016-05-26 NOTE — Progress Notes (Signed)
Dr. [name] at pt's bedside.

## 2016-05-26 NOTE — Progress Notes (Signed)
Dr. Jacinto Reap. Denneny made aware of [name] abnormal EKG.  Orders given -- See [facility]

## 2016-05-26 NOTE — Progress Notes (Addendum)
ANTICOAGULATION CONSULT NOTE - Initial Consult  Pharmacy Consult for xarelto Indication: Nonvalvular Atrial Fibrilation  Allergies  Allergen Reactions  . Lorazepam Other (See Comments)    Reaction:  Hallucinations   . Humira [Adalimumab] Other (See Comments)    Pt states that he got pancreatitis.       Vital Signs: Temp: 97.8 F (36.6 C) (10/24 1015) Temp Source: Oral (10/24 1015) BP: 117/75 (10/24 1335) Pulse Rate: 118 (10/24 1335)  Labs:  Recent Labs  05/26/16 1342  LABPROT 13.3  INR 1.01    Estimated Creatinine Clearance: 40.8 mL/min (by C-G formula based on SCr of 1.77 mg/dL (H)).   Medical History: Past Medical History:  Diagnosis Date  . Anemia   . Anxiety   . Aortic insufficiency    a. mild-mod by echo 09/2015.  . Arthritis    "knees; left shoulder" (09/21/2013)  . Ascending aortic aneurysm (HCC)    a. last measurement 5.3 cm 03/2016 -> f/u planned 09/2015 to continue to follow.  . B12 deficiency    takes Vit 12 shot every 14days   . Cataract   . CKD (chronic kidney disease) stage 3, GFR 30-59 ml/min 08/22/2011  . Clotting disorder ([place])   . Crohn's disease ([place])   . Enlarged prostate   . Enteric hyperoxaluria ([place]) 02/21/2016  . GERD (gastroesophageal reflux disease)    takes Omeprazole daily  . Gout    takes Uloric and Colchicine daily  . Hepatitis C 1978   negtive RNA load - spontaneously cleared  . Hiatal hernia   . History of blood transfusion 1978; 1990's; ?   "w/bowel resection; S/P allupurinol; ?" (09/21/2013)  . History of colon polyps   . History of kidney stones   . History of MRSA infection 2010  . History of pulmonary embolism 2006   both legs and both lungs /notes 08/26/2008 (09/21/2013)  . History of small bowel obstruction   . History of staph infection 1978  . Hyperoxaluria (HCC)    Intestinal  . Hypertension   . Insomnia    takes Trazodone nightly  . Internal hemorrhoids   . LV dysfunction    a. h/o EF 45-50% in 2015,  normalized on subsequent echoes.  . Nephrolithiasis   . Pancreatitis 2010   elevated lipase and amylase, stranding in tail of pancreas, ? from Humira  . Pancytopenia    Hx of  . Peripheral neuropathy (HCC)    takes Gabapentin daily  . Post-traumatic stress syndrome    takes Paxil nightly  . Pulmonary nodule    a. 81m by CT 05/2015, recommended f/u 6-12 months.  . RLS (restless legs syndrome)   . Rosacea conjunctivitis(372.31)    takes Minocin daily  . [place] hyperparathyroidism ([place] 02/21/2016  . Sinus bradycardia   . Skin cancer    "cut/burned off left ear and face" (09/21/2013)  . Small bowel obstruction   . Thrombocytopenia (HCC)    hx of    Assessment: 75y.o. male with history of aortic root and ascending aortic aneurysm, mild-mod AI by echo 09/2015, sinus bradycardia (baseline HR 50s), Crohn's disease, HTN, CKD stage III, thrombocytopenia, reduced LVF with EF 45-50% by echo in 2015 (improved to 55-60% in 09/2015), remote history of PE (when hospitalized by SBO in the past - was on Coumadin for 1 year), chronically asymmetric pupils whom presented for planned segmental resection of transverse colon and found to be in rapid atrial fib.  Pharmacy consulted to dose xarelto.  CrCl ~49m/min  Goal of Therapy:  Dose per indication and renal function   Plan:  xarelto 24m po once daily with evening meal Follow up renal function, signs and symptoms of bleeding Pharmacy to provide education  EDolly RiasRPh 05/26/2016, 2:26 PM Pager [phone]

## 2016-05-26 NOTE — Progress Notes (Signed)
Report called to Javier Glazier, RN in [place].
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
## 2016-05-26 NOTE — Progress Notes (Addendum)
Mr. Bomar states he feels weak with nausea.  His BP is low on arrival with irregular heart rate which is new for him.  Obtained EKG from Dr. Lyndle Herrlich.

## 2016-05-27 ENCOUNTER — Observation Stay (HOSPITAL_BASED_OUTPATIENT_CLINIC_OR_DEPARTMENT_OTHER): Payer: Medicare Other

## 2016-05-27 ENCOUNTER — Observation Stay (HOSPITAL_COMMUNITY): Payer: Medicare Other

## 2016-05-27 DIAGNOSIS — K50819 Crohn's disease of both small and large intestine with unspecified complications: Secondary | ICD-10-CM | POA: Diagnosis not present

## 2016-05-27 DIAGNOSIS — I481 Persistent atrial fibrillation: Secondary | ICD-10-CM | POA: Diagnosis not present

## 2016-05-27 DIAGNOSIS — I5042 Chronic combined systolic (congestive) and diastolic (congestive) heart failure: Secondary | ICD-10-CM | POA: Diagnosis present

## 2016-05-27 DIAGNOSIS — D649 Anemia, unspecified: Secondary | ICD-10-CM | POA: Diagnosis not present

## 2016-05-27 DIAGNOSIS — E872 Acidosis: Secondary | ICD-10-CM | POA: Diagnosis present

## 2016-05-27 DIAGNOSIS — I951 Orthostatic hypotension: Secondary | ICD-10-CM

## 2016-05-27 DIAGNOSIS — N183 Chronic kidney disease, stage 3 (moderate): Secondary | ICD-10-CM | POA: Diagnosis not present

## 2016-05-27 DIAGNOSIS — I959 Hypotension, unspecified: Secondary | ICD-10-CM | POA: Diagnosis not present

## 2016-05-27 DIAGNOSIS — I10 Essential (primary) hypertension: Secondary | ICD-10-CM

## 2016-05-27 DIAGNOSIS — M109 Gout, unspecified: Secondary | ICD-10-CM | POA: Diagnosis present

## 2016-05-27 DIAGNOSIS — I2510 Atherosclerotic heart disease of native coronary artery without angina pectoris: Secondary | ICD-10-CM | POA: Diagnosis not present

## 2016-05-27 DIAGNOSIS — G629 Polyneuropathy, unspecified: Secondary | ICD-10-CM | POA: Diagnosis present

## 2016-05-27 DIAGNOSIS — E860 Dehydration: Secondary | ICD-10-CM | POA: Diagnosis present

## 2016-05-27 DIAGNOSIS — R911 Solitary pulmonary nodule: Secondary | ICD-10-CM | POA: Diagnosis present

## 2016-05-27 DIAGNOSIS — K529 Noninfective gastroenteritis and colitis, unspecified: Secondary | ICD-10-CM | POA: Diagnosis not present

## 2016-05-27 DIAGNOSIS — I351 Nonrheumatic aortic (valve) insufficiency: Secondary | ICD-10-CM | POA: Diagnosis not present

## 2016-05-27 DIAGNOSIS — D123 Benign neoplasm of transverse colon: Secondary | ICD-10-CM | POA: Diagnosis present

## 2016-05-27 DIAGNOSIS — F4310 Post-traumatic stress disorder, unspecified: Secondary | ICD-10-CM | POA: Diagnosis present

## 2016-05-27 DIAGNOSIS — N2581 Secondary hyperparathyroidism of renal origin: Secondary | ICD-10-CM | POA: Diagnosis present

## 2016-05-27 DIAGNOSIS — N400 Enlarged prostate without lower urinary tract symptoms: Secondary | ICD-10-CM | POA: Diagnosis present

## 2016-05-27 DIAGNOSIS — G2581 Restless legs syndrome: Secondary | ICD-10-CM | POA: Diagnosis present

## 2016-05-27 DIAGNOSIS — I712 Thoracic aortic aneurysm, without rupture: Secondary | ICD-10-CM | POA: Diagnosis not present

## 2016-05-27 DIAGNOSIS — A09 Infectious gastroenteritis and colitis, unspecified: Secondary | ICD-10-CM | POA: Diagnosis not present

## 2016-05-27 DIAGNOSIS — I4891 Unspecified atrial fibrillation: Secondary | ICD-10-CM

## 2016-05-27 DIAGNOSIS — K5080 Crohn's disease of both small and large intestine without complications: Secondary | ICD-10-CM | POA: Diagnosis present

## 2016-05-27 DIAGNOSIS — J189 Pneumonia, unspecified organism: Secondary | ICD-10-CM | POA: Diagnosis not present

## 2016-05-27 DIAGNOSIS — K219 Gastro-esophageal reflux disease without esophagitis: Secondary | ICD-10-CM | POA: Diagnosis present

## 2016-05-27 DIAGNOSIS — A046 Enteritis due to Yersinia enterocolitica: Secondary | ICD-10-CM | POA: Diagnosis present

## 2016-05-27 DIAGNOSIS — D696 Thrombocytopenia, unspecified: Secondary | ICD-10-CM | POA: Diagnosis present

## 2016-05-27 DIAGNOSIS — F329 Major depressive disorder, single episode, unspecified: Secondary | ICD-10-CM | POA: Diagnosis present

## 2016-05-27 DIAGNOSIS — I130 Hypertensive heart and chronic kidney disease with heart failure and stage 1 through stage 4 chronic kidney disease, or unspecified chronic kidney disease: Secondary | ICD-10-CM | POA: Diagnosis present

## 2016-05-27 DIAGNOSIS — E538 Deficiency of other specified B group vitamins: Secondary | ICD-10-CM | POA: Diagnosis present

## 2016-05-27 LAB — CBC
HEMATOCRIT: 41.7 % (ref 39.0–52.0)
HEMOGLOBIN: 14.1 g/dL (ref 13.0–17.0)
MCH: 29.3 pg (ref 26.0–34.0)
MCHC: 33.8 g/dL (ref 30.0–36.0)
MCV: 86.5 fL (ref 78.0–100.0)
Platelets: 105 10*3/uL — ABNORMAL LOW (ref 150–400)
RBC: 4.82 MIL/uL (ref 4.22–5.81)
RDW: 14.2 % (ref 11.5–15.5)
WBC: 6.5 10*3/uL (ref 4.0–10.5)

## 2016-05-27 LAB — BASIC METABOLIC PANEL
ANION GAP: 7 (ref 5–15)
BUN: 20 mg/dL (ref 6–20)
CHLORIDE: 114 mmol/L — AB (ref 101–111)
CO2: 19 mmol/L — AB (ref 22–32)
Calcium: 8.7 mg/dL — ABNORMAL LOW (ref 8.9–10.3)
Creatinine, Ser: 1.88 mg/dL — ABNORMAL HIGH (ref 0.61–1.24)
GFR calc Af Amer: 39 mL/min — ABNORMAL LOW (ref >60)
GFR calc non Af Amer: 34 mL/min — ABNORMAL LOW (ref >60)
GLUCOSE: 120 mg/dL — AB (ref 65–99)
POTASSIUM: 3.9 mmol/L (ref 3.5–5.1)
Sodium: 140 mmol/L (ref 135–145)

## 2016-05-27 LAB — ECHOCARDIOGRAM COMPLETE
HEIGHTINCHES: 67 in
WEIGHTICAEL: 3174.62 [oz_av]

## 2016-05-27 LAB — MAGNESIUM: Magnesium: 2.3 mg/dL (ref 1.7–2.4)

## 2016-05-27 LAB — CORTISOL: Cortisol, Plasma: 8.1 ug/dL

## 2016-05-27 MED ORDER — SODIUM CHLORIDE 0.9 % IV BOLUS (SEPSIS)
500.0000 mL | Freq: Once | INTRAVENOUS | Status: AC
  Administered 2016-05-27: 500 mL via INTRAVENOUS

## 2016-05-27 NOTE — Progress Notes (Signed)
Had nurse measure BP manually bilaterally - Right arm 88/65, left arm 90/60. Etiology of hypotension still not totally clear. Per review with Dr. Debara Pickett, can't sedate for TEE/DCCV if BP remains hypotensive. Will get screening CXR. I spoke with IM to review if she had any other ideas as to why he could be so hypotensive - she will pursue cortisol testing to eval for adrenal insufficiency, otherwise no focal signs noted. No acute symptoms to suggest any change in aneurysm. No chest pain, dyspnea, unequal pulses, back pain, or evidence of bleeding. F/u CXR and echo. Have asked nurse to contact echo dept to move patient up to be done as soon as they can.  Dayna Dunn PA-C

## 2016-05-27 NOTE — Progress Notes (Signed)
  Echocardiogram 2D Echocardiogram has been performed.  Donata Clay 05/27/2016, 4:22 PM

## 2016-05-27 NOTE — Progress Notes (Signed)
Patient Name: Derek [name] Date of Encounter: 05/27/2016  Primary Cardiologist: [facility] Problem List     Principal Problem:   Atrial fibrillation with RVR [facility]) Active Problems:   Gout   Ascending aortic aneurysm (HCC)   Crohn's ileocolitis (HCC)   CKD (chronic kidney disease) stage 3, GFR 30-59 ml/min   Thrombocytopenia (HCC)   Enteric hyperoxaluria (HCC)   Secondary hyperparathyroidism (HCC)   Lung nodule   Peripheral neuropathic pain   GERD (gastroesophageal reflux disease)   A-fib (HCC)    Subjective   Remains in atrial fib, HR 95-115. Brief increase in rates overnight.  The last pulse logged at 600 was inaccurate - was never 50. Feels weak if he gets up and moves around, but otherwise has no awareness of his atrial fib. No CP, SOB, orthopnea, LEE, abdominal pain, evidence of bleeding or any other evidence of inciting factors.  Inpatient Medications    . atenolol  25 mg Oral Daily  . calcium carbonate  2 tablet Oral BID  . colchicine  0.6 mg Oral Daily  . cyanocobalamin  1,000 mcg Intramuscular Q14 Days  . diltiazem  30 mg Oral Q6H  . gabapentin  100 mg Oral Daily  . gabapentin  200 mg Oral QHS  . ketotifen  1 drop Both Eyes BID  . magnesium oxide  400 mg Oral BID  . pantoprazole  40 mg Oral Daily  . PARoxetine  40 mg Oral QHS  . rivaroxaban  15 mg Oral Q supper  . terazosin  2 mg Oral QHS  . traZODone  150 mg Oral QHS    Vital Signs    Vitals:   05/27/16 0300 05/27/16 0400 05/27/16 0407 05/27/16 0600  BP: (!) 82/58 (!) 75/50  (!) 85/53  Pulse: (!) 105 86  (!) 50  Resp: 16 11  (!) 0  Temp:   97.7 F (36.5 C)   TempSrc:   Oral   SpO2: 95% 94%  96%  Weight:      Height:        Intake/Output Summary (Last 24 hours) at 05/27/16 0745 Last data filed at 05/26/16 1653  Gross per 24 hour  Intake              744 ml  Output                0 ml  Net              744 ml   Filed Weights   05/26/16 1700 05/26/16 2000  Weight: 195 lb 1.7  oz (88.5 kg) 198 lb 6.6 oz (90 kg)    Physical Exam    General: Well developed, well nourished WM, in no acute distress. Pleasant, joyful HEENT: Normocephalic, atraumatic, sclera non-icteric, no xanthomas, nares are without discharge. Neck: Negative for carotid bruits. JVP not elevated. Lungs: Clear bilaterally to auscultation without wheezes, rales, or rhonchi. Breathing is unlabored. Cardiac: Irregularly irregular, soft, nontender, S1 S2 without murmurs, rubs, or gallops.  Abdomen: Soft, non-tender, non-distended with normoactive bowel sounds. No rebound/guarding. Extremities: No clubbing or cyanosis. No edema. Distal pedal pulses are 2+ and equal bilaterally. Skin: Warm and dry, no significant rash. Neuro: Alert and oriented X 3. Strength and sensation in tact. Psych:  Responds to questions appropriately with a normal affect.  Labs    CBC  Recent Labs  05/26/16 1430 05/27/16 0331  WBC 6.7 6.5  HGB 15.0 14.1  HCT 43.9 41.7  MCV  86.2 86.5  PLT 108* 179*   Basic Metabolic Panel  Recent Labs  05/26/16 1342 05/27/16 0331  NA 140 140  K 4.6 3.9  CL 110 114*  CO2 21* 19*  GLUCOSE 97 120*  BUN 21* 20  CREATININE 1.76* 1.88*  CALCIUM 9.0 8.7*  MG 1.4* 2.3   Thyroid Function Tests  Recent Labs  05/26/16 1430  TSH 3.148    Telemetry    Atrial fib rates 95-115 generally (with occ increase to 130s)  Radiology    Dg Abd 1 View  Result Date: 05/13/2016 CLINICAL DATA:  Followup small bowel obstruction. EXAM: ABDOMEN - 1 VIEW COMPARISON:  05/11/2016 FINDINGS: Nasogastric tube tip again seen within the proximal stomach. Mild decrease in dilatation of small bowel loops is seen since previous study. Air-fluid levels cannot be assessed due to lack over recto view. Multiple surgical clips and staples seen within the right abdomen. IMPRESSION: Mild decrease in dilated small bowel loops since prior study. Electronically Signed   By: Earle Gell M.D.   On: 05/13/2016 07:53    Ct Abdomen Pelvis W Contrast  Result Date: 05/11/2016 CLINICAL DATA:  Unable to have bowel movements, with abdominal distention, nausea and vomiting. Personal history of bowel obstructions. Initial encounter. EXAM: CT ABDOMEN AND PELVIS WITH CONTRAST TECHNIQUE: Multidetector CT imaging of the abdomen and pelvis was performed using the standard protocol following bolus administration of intravenous contrast. CONTRAST:  30m ISOVUE-300 IOPAMIDOL (ISOVUE-300) INJECTION 61% COMPARISON:  CT of the abdomen and pelvis from 03/09/2012, and MRI of the brain performed 03/03/2013 FINDINGS: Lower chest: Mild left basilar atelectasis is noted. A 6 mm nodule is noted at the left lower lobe (image 3 of 67). Scattered coronary artery calcifications are seen. Hepatobiliary: A small calcified granuloma is noted at the hepatic dome. The liver is otherwise unremarkable. The patient is status post cholecystectomy, with clips noted at the gallbladder fossa. The common bile duct remains normal in caliber. Pancreas: The pancreas is within normal limits. Spleen: The spleen is enlarged, measuring 16.0 cm in length. Adrenals/Urinary Tract: The adrenal glands are grossly unremarkable in appearance. There is moderate bilateral renal atrophy, with scattered bilateral renal cysts. Small nonobstructing bilateral renal stones are seen, measuring up to 3 mm in size. Mild nonspecific perinephric stranding is noted bilaterally. Stomach/Bowel: There is dilatation of small-bowel loops up to 4.6 cm in maximal diameter, with gradual fecalization at the distal ileum, and decompression distal to focal clumping and mild ileal wall thickening at the right lower quadrant. This likely reflects at least partial small bowel obstruction, due to an underlying adhesion. Residual fluid and air is noted within the ascending colon. The patient's ileocolic anastomosis is grossly unremarkable in appearance. The more distal colon is decompressed and grossly  unremarkable in appearance. The stomach is largely filled with air and fluid, with an enteric tube seen ending at the body of the stomach. Vascular/Lymphatic: Scattered calcification is seen along the abdominal aorta and its branches. The abdominal aorta is otherwise grossly unremarkable. The inferior vena cava is grossly unremarkable. No retroperitoneal lymphadenopathy is seen. No pelvic sidewall lymphadenopathy is identified. Reproductive: The bladder is decompressed and not well characterized. The prostate remains normal in size. Other: No additional soft tissue abnormalities are seen. Musculoskeletal: No acute osseous abnormalities are identified. The visualized musculature is unremarkable in appearance. IMPRESSION: 1. Dilatation of small-bowel loops to 4.6 cm in maximal diameter, with gradual fecalization at the distal ileum, and decompression distal to focal clumping and mild ileal  wall thickening at the right lower quadrant. This likely reflects at least partial small bowel obstruction, due to an underlying adhesion. 2. Ileocolic anastomosis is grossly unremarkable in appearance. 3. Stomach largely filled with air and fluid. 4. Scattered coronary artery calcifications seen. 5. Moderate bilateral renal atrophy, with scattered bilateral renal cysts. Nonobstructing bilateral renal stones measure up to 3 mm in size. 6. Scattered aortic atherosclerosis noted. 7. Mild left basilar atelectasis noted. 8. Splenomegaly noted. 9. **An incidental finding of potential clinical significance has been found. 6 mm nodule at the left lower lung lobe. Non-contrast chest CT at 6-12 months is recommended. If the nodule is stable at time of repeat CT, then future CT at 18-24 months (from today's scan) is considered optional for low-risk patients, but is recommended for high-risk patients. This recommendation follows the consensus statement: Guidelines for Management of Incidental Pulmonary Nodules Detected on CT Images: From the  Fleischner Society 2017; Radiology 2017; 284:228-243.** Electronically Signed   By: Garald Balding M.D.   On: 05/11/2016 23:21   Dg Abd Acute W/chest  Result Date: 05/11/2016 CLINICAL DATA:  Abdominal distention, abdominal pain, nausea and vomiting. History of Crohn's disease and bowel obstruction. EXAM: DG ABDOMEN ACUTE W/ 1V CHEST COMPARISON:  CT abdomen/ pelvis performed subsequently available at time of radiograph interpretation. FINDINGS: Scattered atelectasis at the lung bases. Heart is upper limits of normal in size. There is atherosclerosis of the thoracic aorta. No pleural fluid. Enteric tube in place, tip and side port below the diaphragm in the stomach. Stomach is distended with air-fluid level. Scattered air-fluid levels throughout small bowel. Enteric sutures in the right lower quadrant of the abdomen. No free air. Minimal stool burden throughout the colon. Cholecystectomy clips in the right upper quadrant. There are vascular calcifications. No acute osseous abnormalities are seen. IMPRESSION: 1. Distended stomach with air-fluid level, and air-fluid levels throughout small bowel. Findings suggest small bowel obstruction. 2. Enteric tube in the stomach. 3. Bibasilar atelectasis.  Thoracic aortic atherosclerosis. Electronically Signed   By: Jeb Levering M.D.   On: 05/11/2016 22:54     Patient Profile     22M with aortic root and ascending aortic aneurysm, mild-mod AI by echo 09/2015, sinus bradycardia (baseline HR 50s), Crohn's disease, HTN, CKD stage III, thrombocytopenia, reduced LVF with EF 45-50% by echo in 2015 (improved to 55-60% in 09/2015), remote history of PE, chronically asymmetric pupils who presented for planned segmental resection of transverse colon for dysplastic colon polyp, found to be in newly recognized AF RVR prompting cancellation of surgery. CHADSVASC 3.  Assessment & Plan   1. Atrial fibrillation, newly recognized, unknown duration - Received atenolol 74m  yesterday morning and 353mof diltiazem yesterday afternoon, but subsequent blood pressures have prevented any further dosing. HR remains the same. See below regarding saline bolus. Will hold atenolol, continue order for diltiazem with hold parameters, and follow HR. Will review with MD whether we need to consider TEE/DCCV. Would need to continue uninterrupted anticoagulation for 4 weeks thereafter, which would delay his colon surgery. However, per review with surgeon yesterday, it's not clear that his colon polyp represents cancer and delay of surgery is acceptable if necessary. 2D echo pending - LV function will help guide management.  2. Ascending aortic aneurysm - followed closely by TCTS.  3. HTN, now with hypotension - question whether this could be related to intravascular depletion after bowel prep and being NPO in prep for surgery for quite a while. No evidence of HF on  exam. Received 1 L saline bolus yesterday with improvement in BP, but subsequent dip overnight. Will give another 500cc this AM. See meds as above. Appreciate internal medicine input if they think any other process could be causing hypotension.  4. CKD stage III (baseline 1.6-1.9) - remains near baseline.  5. Hypomagnesemia - suspect r/t bowel prep. Improved after repletion.  Signed, Charlie Pitter, PA-C  05/27/2016, 7:45 AM

## 2016-05-27 NOTE — Progress Notes (Signed)
[facility] on call paged and informed of patient blood pressure (77/56 MAP 62) and heart rate A.Fib 111. Telephone order given to hold PO Cardizem and to continue to monitor BP. Patient alert/oriented. Will continue to monitor and notify cardiology as needed.

## 2016-05-27 NOTE — Care Management Obs Status (Signed)
[facility] NOTIFICATION   Patient Details  Name: Derek [name] MRN: [id number] Date of Birth: 06/30/1941   Medicare Observation Status Notification Given:  Yes    Leeroy Cha, RN 05/27/2016, 3:24 PM

## 2016-05-27 NOTE — Progress Notes (Signed)
Patient ID: Derek [name], male   DOB: November 20, 1940, 75 y.o.   MRN: [id number]    PROGRESS NOTE    Derek [name]  [id number] DOB: 07/11/1941 DOA: 05/26/2016  PCP: Purvis Kilts, MD   Brief Narrative:  75 y.o. male with known colon poylps, AAA, mild to mod AI followed by cardiology, Crohn's disease, B12 deficiency, GERD, Gout, Hep C, PE, chronic thrombocytopenia,  PTSD, Lung nodule, HTN, RLS who was in Pre-op eval for segmental resection of the transverse colon on the day of the admission (due to high grade dysplasia on recent colonocopy) when he was noted to be in A-fib with RVR. He has not had any chest pain, palpations, dyspnea, light headed sensation or pedal edema. He said he was 'cleaned out' for the surgery and felt dehydrated. Gen surgery asked for [facility] to admit. Cardiology consulting.  Assessment & Plan:  Atrial fibrillation with RVR - unknown duration  - cardiology team following - currently on Cardizem 30 mg PO Q6 hours, has also received one dose of Atenolol yesterday  - continue Xarelto  - ECHO pending   Hypotension - unclear etiology - ? If component of dehydration from colon prep  - if SBP < 90, will give small bolus of IVF NS 500 cc at the time - random cortisol check - no evidence of sepsis, any infectious etiology, pt with no chest pain, no abd or urinary concerns   Ascending aortic aneurysm  - followed closely by TCTS.  CKD stage III (baseline 1.6-1.9)  - remains near baseline - BMP in AM  Hypomagnesemia  - supplemented and will repeat MG level in AM   DVT prophylaxis: Lovenox SQ Code Status: Full  Family Communication: Patient and wife at bedside  Disposition Plan: Keep in SDU due to hypotension   Consultants:   Cardiology   Procedures:   None  Antimicrobials:   None   Subjective: NO specific concerns this AM.   Objective: Vitals:   05/27/16 1100 05/27/16 1200 05/27/16 1300 05/27/16 1400  BP: (!)  88/62 (!) 87/70 107/64 (!) 83/54  Pulse:  78 61 (!) 106  Resp: (!) 22 (!) 21 (!) 27 13  Temp:  97.6 F (36.4 C)    TempSrc:  Oral    SpO2:  97% 96% 96%  Weight:      Height:        Intake/Output Summary (Last 24 hours) at 05/27/16 1436 Last data filed at 05/27/16 1400  Gross per 24 hour  Intake             1774 ml  Output                0 ml  Net             1774 ml   Filed Weights   05/26/16 1700 05/26/16 2000  Weight: 88.5 kg (195 lb 1.7 oz) 90 kg (198 lb 6.6 oz)    Examination:  General exam: Appears calm and comfortable  Respiratory system: Clear to auscultation. Respiratory effort normal. Cardiovascular system: IRRR. No JVD, murmurs, rubs, gallops or clicks. No pedal edema. Gastrointestinal system: Abdomen is nondistended, soft and nontender. No organomegaly or masses felt.  Central nervous system: Alert and oriented. No focal neurological deficits. Psychiatry: Judgement and insight appear normal. Mood & affect appropriate.   Data Reviewed: I have personally reviewed following labs and imaging studies  CBC:  Recent Labs Lab 05/21/16 0900 05/26/16 1430 05/27/16 0331  WBC 4.7  6.7 6.5  HGB 14.0 15.0 14.1  HCT 42.6 43.9 41.7  MCV 88.2 86.2 86.5  PLT 120* 108* 017*   Basic Metabolic Panel:  Recent Labs Lab 05/21/16 0900 05/26/16 1342 05/27/16 0331  NA 141 140 140  K 5.0 4.6 3.9  CL 114* 110 114*  CO2 21* 21* 19*  GLUCOSE 104* 97 120*  BUN 24* 21* 20  CREATININE 1.77* 1.76* 1.88*  CALCIUM 8.9 9.0 8.7*  MG  --  1.4* 2.3   Liver Function Tests:  Recent Labs Lab 05/21/16 0900  AST 24  ALT 27  ALKPHOS 112  BILITOT 0.9  PROT 7.0  ALBUMIN 4.2   Coagulation Profile:  Recent Labs Lab 05/21/16 0900 05/26/16 1342 05/26/16 1430  INR 1.02 1.01 1.03   Thyroid Function Tests:  Recent Labs  05/26/16 1430  TSH 3.148  FREET4 0.97   Urine analysis:    Component Value Date/Time   COLORURINE AMBER (A) 05/11/2016 2153   APPEARANCEUR CLEAR  05/11/2016 2153   LABSPEC 1.025 05/11/2016 2153   PHURINE 6.0 05/11/2016 2153   GLUCOSEU NEGATIVE 05/11/2016 2153   HGBUR NEGATIVE 05/11/2016 2153   BILIRUBINUR SMALL (A) 05/11/2016 2153   KETONESUR NEGATIVE 05/11/2016 2153   PROTEINUR NEGATIVE 05/11/2016 2153   NITRITE NEGATIVE 05/11/2016 2153   LEUKOCYTESUR TRACE (A) 05/11/2016 2153   Recent Results (from the past 240 hour(s))  Surgical pcr screen     Status: None   Collection Time: 05/21/16  8:44 AM  Result Value Ref Range Status   MRSA, PCR NEGATIVE NEGATIVE Final   Staphylococcus aureus NEGATIVE NEGATIVE Final    Comment:        The Xpert SA Assay (FDA approved for NASAL specimens in patients over 37 years of age), is one component of a comprehensive surveillance program.  Test performance has been validated by [facility] for patients greater than or equal to 21 year old. It is not intended to diagnose infection nor to guide or monitor treatment.   MRSA PCR Screening     Status: None   Collection Time: 05/26/16  5:01 PM  Result Value Ref Range Status   MRSA by PCR NEGATIVE NEGATIVE Final    Radiology Studies: No results found.  Scheduled Meds: . calcium carbonate  2 tablet Oral BID  . colchicine  0.6 mg Oral Daily  . cyanocobalamin  1,000 mcg Intramuscular Q14 Days  . diltiazem  30 mg Oral Q6H  . gabapentin  100 mg Oral Daily  . gabapentin  200 mg Oral QHS  . ketotifen  1 drop Both Eyes BID  . magnesium oxide  400 mg Oral BID  . pantoprazole  40 mg Oral Daily  . PARoxetine  40 mg Oral QHS  . rivaroxaban  15 mg Oral Q supper  . terazosin  2 mg Oral QHS  . traZODone  150 mg Oral QHS   Continuous Infusions:    LOS: 1 day   Time spent: 20 minutes   Faye Ramsay, MD [facility] Pager [phone]  If 7PM-7AM, please contact night-coverage [email or web address] Password [email or web address] 05/27/2016, 2:36 PM

## 2016-05-28 DIAGNOSIS — I481 Persistent atrial fibrillation: Secondary | ICD-10-CM

## 2016-05-28 DIAGNOSIS — K529 Noninfective gastroenteritis and colitis, unspecified: Secondary | ICD-10-CM

## 2016-05-28 LAB — MAGNESIUM: Magnesium: 1.6 mg/dL — ABNORMAL LOW (ref 1.7–2.4)

## 2016-05-28 LAB — BASIC METABOLIC PANEL
ANION GAP: 7 (ref 5–15)
BUN: 16 mg/dL (ref 6–20)
CALCIUM: 8.6 mg/dL — AB (ref 8.9–10.3)
CO2: 19 mmol/L — AB (ref 22–32)
CREATININE: 1.86 mg/dL — AB (ref 0.61–1.24)
Chloride: 114 mmol/L — ABNORMAL HIGH (ref 101–111)
GFR calc Af Amer: 40 mL/min — ABNORMAL LOW (ref >60)
GFR calc non Af Amer: 34 mL/min — ABNORMAL LOW (ref >60)
GLUCOSE: 112 mg/dL — AB (ref 65–99)
Potassium: 3.9 mmol/L (ref 3.5–5.1)
Sodium: 140 mmol/L (ref 135–145)

## 2016-05-28 LAB — CBC
HCT: 41.3 % (ref 39.0–52.0)
Hemoglobin: 13.8 g/dL (ref 13.0–17.0)
MCH: 29.2 pg (ref 26.0–34.0)
MCHC: 33.4 g/dL (ref 30.0–36.0)
MCV: 87.5 fL (ref 78.0–100.0)
PLATELETS: 105 10*3/uL — AB (ref 150–400)
RBC: 4.72 MIL/uL (ref 4.22–5.81)
RDW: 14.4 % (ref 11.5–15.5)
WBC: 6.1 10*3/uL (ref 4.0–10.5)

## 2016-05-28 LAB — C DIFFICILE QUICK SCREEN W PCR REFLEX
C Diff antigen: NEGATIVE
C Diff interpretation: NOT DETECTED
C Diff toxin: NEGATIVE

## 2016-05-28 LAB — LACTIC ACID, PLASMA
LACTIC ACID, VENOUS: 1.6 mmol/L (ref 0.5–1.9)
LACTIC ACID, VENOUS: 2.1 mmol/L — AB (ref 0.5–1.9)

## 2016-05-28 LAB — PROCALCITONIN: Procalcitonin: <0.1 ng/mL

## 2016-05-28 MED ORDER — SODIUM CHLORIDE 0.9 % IV SOLN
INTRAVENOUS | Status: DC
  Administered 2016-05-28 – 2016-05-31 (×5): via INTRAVENOUS

## 2016-05-28 MED ORDER — SODIUM CHLORIDE 0.9 % IV SOLN: 250.0000 mL | INTRAVENOUS | Status: DC

## 2016-05-28 MED ORDER — SODIUM CHLORIDE 0.9% FLUSH: 3.0000 mL | INTRAVENOUS | Status: DC | PRN

## 2016-05-28 MED ORDER — SACCHAROMYCES BOULARDII 250 MG PO CAPS
250.0000 mg | ORAL_CAPSULE | Freq: Every day | ORAL | Status: DC
  Administered 2016-05-28 – 2016-05-29 (×2): 250 mg via ORAL
  Filled 2016-05-28 (×3): qty 1

## 2016-05-28 MED ORDER — MAGNESIUM SULFATE 2 GM/50ML IV SOLN
2.0000 g | Freq: Once | INTRAVENOUS | Status: AC
  Administered 2016-05-28: 2 g via INTRAVENOUS
  Filled 2016-05-28: qty 50

## 2016-05-28 MED ORDER — SODIUM CHLORIDE 0.9% FLUSH
3.0000 mL | Freq: Two times a day (BID) | INTRAVENOUS | Status: DC
  Administered 2016-05-28 – 2016-06-02 (×4): 3 mL via INTRAVENOUS

## 2016-05-28 MED ORDER — SODIUM CHLORIDE 0.9 % IV BOLUS (SEPSIS)
500.0000 mL | Freq: Once | INTRAVENOUS | Status: AC
  Administered 2016-05-28: 500 mL via INTRAVENOUS

## 2016-05-28 MED ORDER — DIGOXIN 0.25 MG/ML IJ SOLN
0.2500 mg | Freq: Once | INTRAMUSCULAR | Status: AC
Start: 2016-05-28 — End: 2016-05-28
  Administered 2016-05-28: 0.25 mg via INTRAVENOUS
  Filled 2016-05-28 (×2): qty 1

## 2016-05-28 NOTE — Progress Notes (Signed)
Patient ID: Derek [name], male   DOB: 1941/01/12, 75 y.o.   MRN: [id number]    PROGRESS NOTE    Derek [name]  YTW:[id number] DOB: 08/10/1940 DOA: 05/26/2016  PCP: Derek Kilts, MD   Brief Narrative:  75 y.o. male with known colon poylps, AAA, mild to mod AI followed by cardiology, Crohn's disease, B12 deficiency, GERD, Gout, Hep C, PE, chronic thrombocytopenia,  PTSD, Lung nodule, HTN, RLS who was in Pre-op eval for segmental resection of the transverse colon on the day of the admission (due to high grade dysplasia on recent colonocopy) when he was noted to be in A-fib with RVR. He has not had any chest pain, palpations, dyspnea, light headed sensation or pedal edema. He said he was 'cleaned out' for the surgery and felt dehydrated. Gen surgery asked for [facility] to admit. Cardiology consulting.  Assessment & Plan:  SIRS, Diarrhea - unclear etiology but pt now with T 96.2, HR still up, RR > 22 - will ask for lactic acid and procalcitonin levels, if suggestive of an infectious etiology will proceed with full sepsis work up - suspect that Temp and HR maybe related to ongoing diarrhea, C. Diff and GI stool panel requested - GI consulted as well - follow up on lactic acid but hold off on ABX for now as no clear evidence of an infection noted   Atrial fibrillation with RVR - unknown duration  - cardiology team following, appreciate assistance  - currently on Cardizem 30 mg PO Q6 hours, has also received one dose of Atenolol yesterday  - continue Xarelto  - ECHO pending   Hypotension - unclear etiology - ? If component of dehydration from colon prep and persistent diarrhea  - if SBP < 90, will give small bolus of IVF NS 500 cc at the time - random cortisolWNL - will ask for lactic acid and procalcitonin   Ascending aortic aneurysm  - followed closely by TCTS.  CKD stage III (baseline 1.6-1.9)  - remains near baseline - BMP in AM  Hypomagnesemia  -  supplement and repeat Mg level in AM   DVT prophylaxis: Lovenox SQ Code Status: Full  Family Communication: Patient and wife at bedside  Disposition Plan: Keep in SDU due to hypotension   Consultants:   Cardiology   Procedures:   None  Antimicrobials:   None   Subjective: NO specific concerns this AM.   Objective: Vitals:   05/28/16 1100 05/28/16 1200 05/28/16 1300 05/28/16 1400  BP: 122/89 113/65 98/74 111/65  Pulse: (!) 121 78 (!) 104 89  Resp: (!) 30 17 18  (!) 25  Temp:  (!) 96.2 F (35.7 C)    TempSrc:  Axillary    SpO2: 97% (!) 76% 98% 98%  Weight:      Height:        Intake/Output Summary (Last 24 hours) at 05/28/16 1509 Last data filed at 05/28/16 1329  Gross per 24 hour  Intake              960 ml  Output                1 ml  Net              959 ml   Filed Weights   05/26/16 1700 05/26/16 2000 05/27/16 2034  Weight: 88.5 kg (195 lb 1.7 oz) 90 kg (198 lb 6.6 oz) 89.8 kg (197 lb 15.6 oz)    Examination:  General exam:  Appears calm and comfortable  Respiratory system: Clear to auscultation. Respiratory effort normal. Cardiovascular system: IRRR. No JVD, murmurs, rubs, gallops or clicks. No pedal edema. Gastrointestinal system: Abdomen is nondistended, soft and nontender. No organomegaly or masses felt.  Central nervous system: Alert and oriented. No focal neurological deficits. Psychiatry: Judgement and insight appear normal. Mood & affect appropriate.   Data Reviewed: I have personally reviewed following labs and imaging studies  CBC:  Recent Labs Lab 05/26/16 1430 05/27/16 0331 05/28/16 0344  WBC 6.7 6.5 6.1  HGB 15.0 14.1 13.8  HCT 43.9 41.7 41.3  MCV 86.2 86.5 87.5  PLT 108* 105* 774*   Basic Metabolic Panel:  Recent Labs Lab 05/26/16 1342 05/27/16 0331 05/28/16 0344  NA 140 140 140  K 4.6 3.9 3.9  CL 110 114* 114*  CO2 21* 19* 19*  GLUCOSE 97 120* 112*  BUN 21* 20 16  CREATININE 1.76* 1.88* 1.86*  CALCIUM 9.0 8.7*  8.6*  MG 1.4* 2.3 1.6*   Liver Function Tests: No results for input(s): AST, ALT, ALKPHOS, BILITOT, PROT, ALBUMIN in the last 168 hours. Coagulation Profile:  Recent Labs Lab 05/26/16 1342 05/26/16 1430  INR 1.01 1.03   Thyroid Function Tests:  Recent Labs  05/26/16 1430  TSH 3.148  FREET4 0.97   Urine analysis:    Component Value Date/Time   COLORURINE AMBER (A) 05/11/2016 2153   APPEARANCEUR CLEAR 05/11/2016 2153   LABSPEC 1.025 05/11/2016 2153   PHURINE 6.0 05/11/2016 2153   GLUCOSEU NEGATIVE 05/11/2016 2153   HGBUR NEGATIVE 05/11/2016 2153   BILIRUBINUR SMALL (A) 05/11/2016 2153   KETONESUR NEGATIVE 05/11/2016 2153   PROTEINUR NEGATIVE 05/11/2016 2153   NITRITE NEGATIVE 05/11/2016 2153   LEUKOCYTESUR TRACE (A) 05/11/2016 2153   Recent Results (from the past 240 hour(s))  Surgical pcr screen     Status: None   Collection Time: 05/21/16  8:44 AM  Result Value Ref Range Status   MRSA, PCR NEGATIVE NEGATIVE Final   Staphylococcus aureus NEGATIVE NEGATIVE Final    Comment:        The Xpert SA Assay (FDA approved for NASAL specimens in patients over 21 years of age), is one component of a comprehensive surveillance program.  Test performance has been validated by [facility] for patients greater than or equal to 55 year old. It is not intended to diagnose infection nor to guide or monitor treatment.   MRSA PCR Screening     Status: None   Collection Time: 05/26/16  5:01 PM  Result Value Ref Range Status   MRSA by PCR NEGATIVE NEGATIVE Final    Radiology Studies: Dg Chest 2 View  Result Date: 05/27/2016 CLINICAL DATA:  Atrial fibrillation and hypotension EXAM: CHEST  2 VIEW COMPARISON:  05/11/2016 FINDINGS: Cardiac shadow is within normal limits. Mild aortic calcifications are again seen and stable. The lungs are clear bilaterally. No acute bony abnormality is noted. Left shoulder replacement is seen. IMPRESSION: No active cardiopulmonary disease.  Electronically Signed   By: Inez Catalina M.D.   On: 05/27/2016 15:46    Scheduled Meds: . calcium carbonate  2 tablet Oral BID  . colchicine  0.6 mg Oral Daily  . cyanocobalamin  1,000 mcg Intramuscular Q14 Days  . diltiazem  30 mg Oral Q6H  . gabapentin  100 mg Oral Daily  . gabapentin  200 mg Oral QHS  . ketotifen  1 drop Both Eyes BID  . magnesium oxide  400 mg Oral BID  .  pantoprazole  40 mg Oral Daily  . PARoxetine  40 mg Oral QHS  . rivaroxaban  15 mg Oral Q supper  . sodium chloride flush  3 mL Intravenous Q12H  . traZODone  150 mg Oral QHS   Continuous Infusions: . sodium chloride 75 mL/hr at 05/28/16 1307  . sodium chloride       LOS: 2 days   Time spent: 20 minutes   Faye Ramsay, MD [facility] Pager [phone]  If 7PM-7AM, please contact night-coverage [email or web address] Password [email or web address] 05/28/2016, 3:09 PM

## 2016-05-28 NOTE — Progress Notes (Signed)
Patient Name: Derek [name] Date of Encounter: 05/28/2016  Primary Cardiologist: Dr. Selmer Dominion Problem List     Principal Problem:   Atrial fibrillation with RVR [facility]) Active Problems:   Gout   Ascending aortic aneurysm (HCC)   Crohn's ileocolitis (HCC)   CKD (chronic kidney disease) stage 3, GFR 30-59 ml/min   Thrombocytopenia (HCC)   Enteric hyperoxaluria (HCC)   Secondary hyperparathyroidism ([place])   Lung nodule   Peripheral neuropathic pain   GERD (gastroesophageal reflux disease)   Essential hypertension   Hypotension   Hypomagnesemia    Subjective   Denies any episodes of lightheadedness or dizziness. SBP in 70's - 90's overnight, improved to the 100's this AM. HR remains elevated, peaking into the 130's. Wife at the bedside.   Inpatient Medications    Scheduled Meds: . calcium carbonate  2 tablet Oral BID  . colchicine  0.6 mg Oral Daily  . cyanocobalamin  1,000 mcg Intramuscular Q14 Days  . diltiazem  30 mg Oral Q6H  . gabapentin  100 mg Oral Daily  . gabapentin  200 mg Oral QHS  . ketotifen  1 drop Both Eyes BID  . magnesium oxide  400 mg Oral BID  . pantoprazole  40 mg Oral Daily  . PARoxetine  40 mg Oral QHS  . rivaroxaban  15 mg Oral Q supper  . terazosin  2 mg Oral QHS  . traZODone  150 mg Oral QHS   Continuous Infusions:   PRN Meds: acetaminophen, HYDROcodone-acetaminophen, ondansetron (ZOFRAN) IV   Vital Signs    Vitals:   05/28/16 0400 05/28/16 0500 05/28/16 0600 05/28/16 0700  BP: 91/67  (!) 76/49 (!) 74/53  Pulse: (!) 117 (!) 119 65 (!) 122  Resp: (!) 25 (!) 25 19 13   Temp:      TempSrc:      SpO2: 95% 92% 95% 97%  Weight:      Height:        Intake/Output Summary (Last 24 hours) at 05/28/16 0813 Last data filed at 05/27/16 2200  Gross per 24 hour  Intake             1400 ml  Output                0 ml  Net             1400 ml   Filed Weights   05/26/16 1700 05/26/16 2000 05/27/16 2034  Weight: 195 lb 1.7 oz  (88.5 kg) 198 lb 6.6 oz (90 kg) 197 lb 15.6 oz (89.8 kg)    Physical Exam   GEN: Well nourished, well developed male appearing in no acute distress.  HEENT: Grossly normal.  Neck: Supple, no JVD, carotid bruits, or masses. Cardiac: Irregularly irregular, no murmurs, rubs, or gallops. No clubbing, cyanosis, edema.  Radials/DP/PT 2+ and equal bilaterally.  Respiratory:  Respirations regular and unlabored, clear to auscultation bilaterally. GI: Soft, nontender, nondistended, BS + x 4. MS: no deformity or atrophy. Skin: warm and dry, no rash. Neuro:  Strength and sensation are intact. Psych: AAOx3.  Normal affect.  Labs    CBC  Recent Labs  05/27/16 0331 05/28/16 0344  WBC 6.5 6.1  HGB 14.1 13.8  HCT 41.7 41.3  MCV 86.5 87.5  PLT 105* 557*   Basic Metabolic Panel  Recent Labs  05/27/16 0331 05/28/16 0344  NA 140 140  K 3.9 3.9  CL 114* 114*  CO2 19* 19*  GLUCOSE 120*  112*  BUN 20 16  CREATININE 1.88* 1.86*  CALCIUM 8.7* 8.6*  MG 2.3 1.6*   Thyroid Function Tests  Recent Labs  05/26/16 1430  TSH 3.148    Telemetry    Atrial fibrillation, HR in the 110's - 130's.  - Personally Reviewed  ECG    No new tracings.   Radiology    Dg Chest 2 View  Result Date: 05/27/2016 CLINICAL DATA:  Atrial fibrillation and hypotension EXAM: CHEST  2 VIEW COMPARISON:  05/11/2016 FINDINGS: Cardiac shadow is within normal limits. Mild aortic calcifications are again seen and stable. The lungs are clear bilaterally. No acute bony abnormality is noted. Left shoulder replacement is seen. IMPRESSION: No active cardiopulmonary disease. Electronically Signed   By: Inez Catalina M.D.   On: 05/27/2016 15:46    Cardiac Studies   Echocardiogram: 05/27/2016 Study Conclusions  - Left ventricle: The cavity size was normal. There was moderate   concentric hypertrophy. Systolic function was mildly reduced. The   estimated ejection fraction was in the range of 45% to 50%. Wall    motion was normal; there were no regional wall motion   abnormalities. - Aortic valve: There was mild regurgitation. - Aorta: Aortic root dimension: 51 mm (ED). Ascending aorta   diameter: 47 mm (ED). - Aortic root: The aortic root was moderately dilated. - Ascending aorta: The ascending aorta was moderately dilated. - Mitral valve: Calcified annulus.  Patient Profile     25M w/ PMH of aortic root and ascending aortic aneurysm, mild-mod AI by echo 09/2015, sinus bradycardia (baseline HR 50's), Crohn's disease, HTN, CKD stage III, thrombocytopenia, reduced LVF with EF 45-50% by echo in 2015 (improved to 55-60% in 09/2015), remote history of PE, chronically asymmetric pupilswho presented on 05/26/2016 for planned segmental resection of transverse colon for dysplastic colon polyp, found to be in newly recognized AF RVR prompting cancellation of surgery.   Assessment & Plan    1. Atrial fibrillation, newly recognized, unknown duration  - received Atenolol 79m and 328mof Diltiazem on the day of admission but subsequent blood pressures have prevented any further dosing. HR remains the same. Has Cardizem 3064m6H ordered with hold parameters but unable to receive any doses on 10/25 secondary to hypotension.  - This patients CHA2DS2-VASc Score and unadjusted Ischemic Stroke Rate (% per year) is equal to 3.2 % stroke rate/year from a score of 3 (CHF, HTN, Age). Continue Xarelto for anticoagulation.  - Echo shows mildly reduced EF of 45-50% with no regional wall motion abnormalities. LA normal in size. Normal EF in 09/2015. Perhaps tachycardia induced. Denies any recent anginal symptoms. Consider ischemic evaluation as an outpatient.  - TSH and Free T4 WNL. Mg at 1.6 this AM (will replace).  - cannot pursue TEE/DCCV at this time with persistent hypotension (SBP in 70-80's overnight) due to need for sedation. Will give additional 500 cc of IVF this AM. Monitor BP throughout the day. Will tentatively place  for TEE/DCCV tomorrow if BP improved and schedule allows.    2. Ascending aortic aneurysm - echo this admission shows aortic root dimension: 51 mm (ED). Ascending aorta diameter: 47 mm (ED). -  followed closely by [facility]CTS as an outpatient.  3. HTN, now with hypotension  - BP has been 74/49 - 116/84 in the past 24 hours.  - question whether this could be related to intravascular depletion after bowel prep and being NPO in prep for surgery for quite a while. No evidence of HF on  exam. Is +2.2L this admission.   4. CKD stage III  - baseline 1.6-1.9. At 1.86 today.   5. Hypomagnesemia  - suspect r/t bowel prep. Mg 1.6 this AM. Will replace.   Signed, Erma Heritage, PA  05/28/2016, 8:13 AM
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
## 2016-05-28 NOTE — Progress Notes (Signed)
CRITICAL VALUE ALERT  Critical value received:  Lactic acid 2.1  Date of notification:  05/28/2016  Time of notification:  1915  Critical value read back:Yes.    Nurse who received alert:  Lorne Skeens  MD notified (1st page):  A Hijazi  Time of first page:  1940  MD notified (2nd page):  Time of second page:  Responding MD:  A Hijazi  Time MD responded:  1945

## 2016-05-29 ENCOUNTER — Encounter (HOSPITAL_COMMUNITY): Payer: Self-pay | Admitting: *Deleted

## 2016-05-29 ENCOUNTER — Inpatient Hospital Stay (HOSPITAL_COMMUNITY): Payer: Medicare Other | Admitting: Certified Registered Nurse Anesthetist

## 2016-05-29 ENCOUNTER — Inpatient Hospital Stay (HOSPITAL_COMMUNITY): Payer: Medicare Other

## 2016-05-29 ENCOUNTER — Encounter (HOSPITAL_COMMUNITY)
Admission: RE | Disposition: A | Discharge status: Home / Self Care | Payer: Self-pay | Source: Ambulatory Visit | Attending: Internal Medicine

## 2016-05-29 DIAGNOSIS — A09 Infectious gastroenteritis and colitis, unspecified: Secondary | ICD-10-CM

## 2016-05-29 DIAGNOSIS — I4891 Unspecified atrial fibrillation: Secondary | ICD-10-CM

## 2016-05-29 DIAGNOSIS — I351 Nonrheumatic aortic (valve) insufficiency: Secondary | ICD-10-CM

## 2016-05-29 HISTORY — PX: TEE WITHOUT CARDIOVERSION: SHX5443

## 2016-05-29 HISTORY — PX: CARDIOVERSION: SHX1299

## 2016-05-29 LAB — GASTROINTESTINAL PANEL BY PCR, STOOL (REPLACES STOOL CULTURE)

## 2016-05-29 LAB — BASIC METABOLIC PANEL
Anion gap: 8 (ref 5–15)
BUN: 12 mg/dL (ref 6–20)
CHLORIDE: 116 mmol/L — AB (ref 101–111)
CO2: 17 mmol/L — AB (ref 22–32)
CREATININE: 1.75 mg/dL — AB (ref 0.61–1.24)
Calcium: 8.3 mg/dL — ABNORMAL LOW (ref 8.9–10.3)
GFR calc Af Amer: 43 mL/min — ABNORMAL LOW (ref >60)
GFR calc non Af Amer: 37 mL/min — ABNORMAL LOW (ref >60)
GLUCOSE: 115 mg/dL — AB (ref 65–99)
Potassium: 3.9 mmol/L (ref 3.5–5.1)
Sodium: 141 mmol/L (ref 135–145)

## 2016-05-29 LAB — CBC
HCT: 39.9 % (ref 39.0–52.0)
Hemoglobin: 13.4 g/dL (ref 13.0–17.0)
MCH: 29.7 pg (ref 26.0–34.0)
MCHC: 33.6 g/dL (ref 30.0–36.0)
MCV: 88.5 fL (ref 78.0–100.0)
PLATELETS: 91 10*3/uL — AB (ref 150–400)
RBC: 4.51 MIL/uL (ref 4.22–5.81)
RDW: 14.6 % (ref 11.5–15.5)
WBC: 5.4 10*3/uL (ref 4.0–10.5)

## 2016-05-29 LAB — MAGNESIUM: Magnesium: 1.5 mg/dL — ABNORMAL LOW (ref 1.7–2.4)

## 2016-05-29 SURGERY — CARDIOVERSION
Anesthesia: Monitor Anesthesia Care

## 2016-05-29 MED ORDER — SODIUM CHLORIDE 0.9 % IV SOLN
INTRAVENOUS | Status: DC | PRN
  Administered 2016-05-29: 13:00:00 via INTRAVENOUS

## 2016-05-29 MED ORDER — PROPOFOL 10 MG/ML IV BOLUS
INTRAVENOUS | Status: DC | PRN
  Administered 2016-05-29 (×3): 10 mg via INTRAVENOUS

## 2016-05-29 MED ORDER — PROPOFOL 500 MG/50ML IV EMUL
INTRAVENOUS | Status: DC | PRN
  Administered 2016-05-29: 75 ug/kg/min via INTRAVENOUS

## 2016-05-29 MED ORDER — PHENYLEPHRINE HCL 10 MG/ML IJ SOLN
INTRAMUSCULAR | Status: DC | PRN
  Administered 2016-05-29: 80 ug via INTRAVENOUS

## 2016-05-29 MED ORDER — MAGNESIUM SULFATE 2 GM/50ML IV SOLN
2.0000 g | Freq: Once | INTRAVENOUS | Status: AC
  Administered 2016-05-29: 2 g via INTRAVENOUS
  Filled 2016-05-29: qty 50

## 2016-05-29 NOTE — Anesthesia Procedure Notes (Signed)
Procedure Name: MAC Date/Time: 05/29/2016 1:05 PM Performed by: Garrison Columbus T Pre-anesthesia Checklist: Patient identified, Emergency Drugs available, Suction available and Patient being monitored Patient Re-evaluated:Patient Re-evaluated prior to inductionOxygen Delivery Method: Simple face mask Preoxygenation: Pre-oxygenation with 100% oxygen Intubation Type: IV induction Placement Confirmation: positive ETCO2 and breath sounds checked- equal and bilateral Dental Injury: Teeth and Oropharynx as per pre-operative assessment

## 2016-05-29 NOTE — Progress Notes (Signed)
[facility] Progress Note  Chief Complaint: diarrhea  Subjective  History:  His diarrhea is improved today, stool has more form and he does not have to go for a BM so soon after eating. He went for cardioversion today, but is back in A fib with a rate of about 100. Denies abdominal pain  ROS: Cardiovascular:  no chest pain Respiratory: no dyspnea  Objective:  Med list reviewed  Vital signs in last 24 hrs: Vitals:   05/29/16 1700 05/29/16 1749  BP: 112/79 112/79  Pulse: 95   Resp: (!) 24   Temp:      Physical Exam   HEENT: sclera anicteric, oral mucosa moist without lesions  Neck: supple, no thyromegaly, JVD or lymphadenopathy  Cardiac:irregularly irregular without murmurs, S1S2 heard, no peripheral edema  Pulm: clear to auscultation bilaterally, normal RR and effort noted  Abdomen: soft, no tenderness, with active bowel sounds. No guarding or palpable hepatosplenomegaly  Skin; warm and dry, no jaundice or rash  Recent Labs:   Recent Labs Lab 05/27/16 0331 05/28/16 0344 05/29/16 0350  WBC 6.5 6.1 5.4  HGB 14.1 13.8 13.4  HCT 41.7 41.3 39.9  PLT 105* 105* 91*    Recent Labs Lab 05/29/16 0350  NA 141  K 3.9  CL 116*  CO2 17*  BUN 12  GLUCOSE 115*    Recent Labs Lab 05/26/16 1430  INR 1.03    GI pathogen panel positive for yersinia enterocolitica  @ASSESSMENTPLANBEGIN @ Assessment:   Infectious diarrhea - sounds like he is getting better.  He is not immune compromised.  I will not start antibiotics.  Plan:  No further GI testing needed now. Just give him probiotics until discharge, then no longer necessary. We will sign off - call as need arises.  Nelida Meuse III Pager [phone] Mon-Fri 8a-5p [phone] after 5p, weekends, holidays

## 2016-05-29 NOTE — Anesthesia Preprocedure Evaluation (Addendum)
Anesthesia Evaluation  Patient identified by MRN, date of birth, ID band Patient awake    Reviewed: Allergy & Precautions, H&P , NPO status , Patient's Chart, lab work & pertinent test results  Airway Mallampati: I  TM Distance: >3 FB Neck ROM: Full    Dental  (+) Teeth Intact, Dental Advisory Given   Pulmonary former smoker,    Pulmonary exam normal        Cardiovascular hypertension, + CAD and + Peripheral Vascular Disease   Rhythm:Irregular Rate:Normal     Neuro/Psych Anxiety  Neuromuscular disease    GI/Hepatic hiatal hernia, GERD  ,(+) Hepatitis -, C  Endo/Other    Renal/GU CRFRenal disease     Musculoskeletal   Abdominal   Peds  Hematology  (+) anemia ,   Anesthesia Other Findings   Reproductive/Obstetrics                           Anesthesia Physical  Anesthesia Plan  ASA: III  Anesthesia Plan: MAC   Post-op Pain Management:    Induction: Intravenous  Airway Management Planned: Simple Face Mask  Additional Equipment:   Intra-op Plan:   Post-operative Plan:   Informed Consent: I have reviewed the patients History and Physical, chart, labs and discussed the procedure including the risks, benefits and alternatives for the proposed anesthesia with the patient or authorized representative who has indicated his/her understanding and acceptance.   Dental advisory given  Plan Discussed with: CRNA  Anesthesia Plan Comments:         Anesthesia Quick Evaluation

## 2016-05-29 NOTE — Progress Notes (Signed)
CRITICAL VALUE ALERT  Critical value received:  GI panel- positive for yersinia enterocolitia    Date of notification:05/29/16  Time of notification:  0600  Critical value read back:Yes.    Nurse who received alert:  Abelino Derrick  MD notified (1st page):  A Hijazi   Time of first page:  0617  MD notified (2nd page):  Time of second page:  Responding MD:  A Hijazi  Time MD responded:  [phone]

## 2016-05-29 NOTE — Op Note (Signed)
Procedure: Electrical Cardioversion Indications:  Atrial Fibrillation  Procedure Details:  Consent: Risks of procedure as well as the alternatives and risks of each were explained to the (patient/caregiver).  Consent for procedure obtained.  Time Out: Verified patient identification, verified procedure, site/side was marked, verified correct patient position, special equipment/implants available, medications/allergies/relevent history reviewed, required imaging and test results available.  Performed  Patient placed on cardiac monitor, pulse oximetry, supplemental oxygen as necessary.  Sedation given: IV propofol, Dr. Lissa Hoard Pacer pads placed anterior and posterior chest.  Cardioverted 1 time(s).  Cardioversion with synchronized biphasic 120J shock.  Evaluation: Findings: Post procedure EKG shows: NSR Complications: None Patient did tolerate procedure well.  Time Spent Directly with the Patient:  30 minutes   Derek [name] 05/29/2016, 1:30 PM

## 2016-05-29 NOTE — Progress Notes (Signed)
  Echocardiogram Echocardiogram Transesophageal has been performed.  Johny Chess 05/29/2016, 1:29 PM

## 2016-05-29 NOTE — Anesthesia Postprocedure Evaluation (Signed)
Anesthesia Post Note  Patient: Derek [name]  Procedure(s) Performed: Procedure(s) (LRB): CARDIOVERSION (N/A) TRANSESOPHAGEAL ECHOCARDIOGRAM (TEE) (N/A)  Patient location during evaluation: PACU Anesthesia Type: MAC Level of consciousness: awake and alert Pain management: pain level controlled Vital Signs Assessment: post-procedure vital signs reviewed and stable Respiratory status: spontaneous breathing Cardiovascular status: stable Anesthetic complications: no    Last Vitals:  Vitals:   05/29/16 1340 05/29/16 1350  BP: 94/65 110/69  Pulse: 72 70  Resp: 20 (!) 22  Temp:      Last Pain:  Vitals:   05/29/16 1234  TempSrc: Oral  PainSc:                  Nolon Nations

## 2016-05-29 NOTE — Progress Notes (Signed)
Patient Name: Derek [name] Date of Encounter: 05/29/2016  Primary Cardiologist: Dr. Selmer Dominion Problem List     Principal Problem:   Atrial fibrillation with RVR [facility]) Active Problems:   Gout   Ascending aortic aneurysm (HCC)   Crohn's ileocolitis (HCC)   CKD (chronic kidney disease) stage 3, GFR 30-59 ml/min   Thrombocytopenia (HCC)   Enteric hyperoxaluria (HCC)   Secondary hyperparathyroidism (HCC)   Lung nodule   Peripheral neuropathic pain   GERD (gastroesophageal reflux disease)   Essential hypertension   Hypotension   Hypomagnesemia   Chronic diarrhea    Patient Profile     42M w/ PMH of aortic root and ascending aortic aneurysm, mild-mod AI by echo 09/2015, sinus bradycardia (baseline HR 50's), Crohn's disease, HTN, CKD stage III, thrombocytopenia, reduced LVF with EF 45-50% by echo in 2015 (improved to 55-60% in 09/2015), remote history of PE, chronically asymmetric pupilswho presented on 05/26/2016 for planned segmental resection of transverse colon for dysplastic colon polyp, found to be in newly recognized AF RVR prompting cancellation of surgery.    Subjective   Feels ok this am. He is asymptomatic with his afib.   Inpatient Medications    Scheduled Meds: . calcium carbonate  2 tablet Oral BID  . colchicine  0.6 mg Oral Daily  . cyanocobalamin  1,000 mcg Intramuscular Q14 Days  . diltiazem  30 mg Oral Q6H  . gabapentin  100 mg Oral Daily  . gabapentin  200 mg Oral QHS  . ketotifen  1 drop Both Eyes BID  . magnesium oxide  400 mg Oral BID  . pantoprazole  40 mg Oral Daily  . PARoxetine  40 mg Oral QHS  . rivaroxaban  15 mg Oral Q supper  . saccharomyces boulardii  250 mg Oral q1800  . sodium chloride flush  3 mL Intravenous Q12H  . traZODone  150 mg Oral QHS   Continuous Infusions: . sodium chloride 75 mL/hr at 05/29/16 0300  . sodium chloride Stopped (05/28/16 1903)   PRN Meds: acetaminophen, HYDROcodone-acetaminophen, ondansetron  (ZOFRAN) IV, sodium chloride flush   Vital Signs    Vitals:   05/29/16 0200 05/29/16 0400 05/29/16 0500 05/29/16 0611  BP: (!) 88/61  101/60 93/70  Pulse: 90  90 (!) 107  Resp: (!) 21  (!) 25 19  Temp:  97.6 F (36.4 C)    TempSrc:  Oral    SpO2: 95%  95% 98%  Weight:      Height:        Intake/Output Summary (Last 24 hours) at 05/29/16 0803 Last data filed at 05/29/16 0600  Gross per 24 hour  Intake           1743.5 ml  Output                1 ml  Net           1742.5 ml   Filed Weights   05/26/16 1700 05/26/16 2000 05/27/16 2034  Weight: 195 lb 1.7 oz (88.5 kg) 198 lb 6.6 oz (90 kg) 197 lb 15.6 oz (89.8 kg)    Physical Exam   GEN: Well nourished, well developed, in no acute distress.  HEENT: Grossly normal.  Neck: Supple, no JVD, carotid bruits, or masses. Cardiac: irregularly irregular, tachy rate, no murmurs, rubs, or gallops. No clubbing, cyanosis, edema.  Radials/DP/PT 2+ and equal bilaterally.  Respiratory:  Respirations regular and unlabored, clear to auscultation bilaterally. GI: Soft, nontender, nondistended,  BS + x 4. MS: no deformity or atrophy. Skin: warm and dry, no rash. Neuro:  Strength and sensation are intact. Psych: AAOx3.  Normal affect.  Labs    CBC  Recent Labs  05/28/16 0344 05/29/16 0350  WBC 6.1 5.4  HGB 13.8 13.4  HCT 41.3 39.9  MCV 87.5 88.5  PLT 105* 91*   Basic Metabolic Panel  Recent Labs  05/27/16 0331 05/28/16 0344 05/29/16 0350  NA 140 140 141  K 3.9 3.9 3.9  CL 114* 114* 116*  CO2 19* 19* 17*  GLUCOSE 120* 112* 115*  BUN 20 16 12   CREATININE 1.88* 1.86* 1.75*  CALCIUM 8.7* 8.6* 8.3*  MG 2.3 1.6*  --    Liver Function Tests No results for input(s): AST, ALT, ALKPHOS, BILITOT, PROT, ALBUMIN in the last 72 hours. No results for input(s): LIPASE, AMYLASE in the last 72 hours. Cardiac Enzymes No results for input(s): CKTOTAL, CKMB, CKMBINDEX, TROPONINI in the last 72 hours. BNP Invalid input(s):  POCBNP D-Dimer No results for input(s): DDIMER in the last 72 hours. Hemoglobin A1C No results for input(s): HGBA1C in the last 72 hours. Fasting Lipid Panel No results for input(s): CHOL, HDL, LDLCALC, TRIG, CHOLHDL, LDLDIRECT in the last 72 hours. Thyroid Function Tests  Recent Labs  05/26/16 1430  TSH 3.148    Telemetry    Atrial fibrillation w/ RVR - Personally Reviewed  Radiology    Dg Chest 2 View  Result Date: 05/27/2016 CLINICAL DATA:  Atrial fibrillation and hypotension EXAM: CHEST  2 VIEW COMPARISON:  05/11/2016 FINDINGS: Cardiac shadow is within normal limits. Mild aortic calcifications are again seen and stable. The lungs are clear bilaterally. No acute bony abnormality is noted. Left shoulder replacement is seen. IMPRESSION: No active cardiopulmonary disease. Electronically Signed   By: Inez Catalina M.D.   On: 05/27/2016 15:46    Cardiac Studies   TEE/DCCV scheduled today at 12 pm   Patient Profile     75M w/ PMH of aortic root and ascending aortic aneurysm, mild-mod AI by echo 09/2015, sinus bradycardia (baseline HR 50's), Crohn's disease, HTN, CKD stage III, thrombocytopenia, reduced LVF with EF 45-50% by echo in 2015 (improved to 55-60% in 09/2015), remote history of PE, chronically asymmetric pupilswho presented on 05/26/2016 for planned segmental resection of transverse colon for dysplastic colon polyp, found to be in newly recognized AF RVR prompting cancellation of surgery.   Assessment & Plan    1. Atrial fibrillation, newly recognized, unknown duration - unable to tolerate rate control agents given hypotension. Plan for TEE/DCCV at [facility] today if BP remains stable. Currently in the low 902X systolic.  - This patients CHA2DS2-VASc Score and unadjusted Ischemic Stroke Rate (% per year) is equal to 3.2 % stroke rate/year from a score of 3 (CHF, HTN, Age). Continue Xarelto for anticoagulation.  - Echo shows mildly reduced EF of 45-50% with no regional wall  motion abnormalities. LA normal in size. Normal EF in 09/2015. Perhaps tachycardia induced. Denies any recent anginal symptoms. Consider ischemic evaluation as an outpatient.  - TSH and Free T4 WNL. Mg at 1.6. Supplementation was given. Obtain f/u lab.    2. Ascending aortic aneurysm - echo this admission shows aortic root dimension: 51 mm (ED). Ascending aorta diameter: 47 mm (ED). -  followed closely by [facility] as an outpatient.  3. HTN, now with hypotension  - Most recent BP was 93/70, improved from early on, in the 11B-52C systolic. Terazosin was held last PM.  -  question whether this could be related to intravascular depletion after bowel prep and being NPO in prep for surgery for quite a while. No evidence of HF on exam. Is +2.2L this admission.   4. CKD stage III  - baseline 1.6-1.9. At 1.75 today.   5. Hypomagnesemia  - suspect r/t bowel prep. Mg 1.6 yesterday. Supplementation given. Obtain f/u lab to reassess.   6. Yersinia: stool test + for Yersinia. This likely explains his diarrhea, dehydration and hypotension, further exacerbated by recent bowel prep. Per IM, plan will be GI consult. Continue supportive care for now.   Signed, [name], PA-C  05/29/2016, 8:03 AM  Pager [phone]

## 2016-05-29 NOTE — Progress Notes (Signed)
Paged Ellen Henri, PA about change in cardiac status to rate controlled afibb.

## 2016-05-29 NOTE — Progress Notes (Signed)
Patient ID: Derek [name], male   DOB: Oct 06, 1940, 75 y.o.   MRN: [id number]    PROGRESS NOTE    Derek [name]  [id number] DOB: 1941/01/21 DOA: 05/26/2016  PCP: Purvis Kilts, MD   Brief Narrative:  75 y.o. male with known colon poylps, AAA, mild to mod AI followed by cardiology, Crohn's disease, B12 deficiency, GERD, Gout, Hep C, PE, chronic thrombocytopenia,  PTSD, Lung nodule, HTN, RLS who was in Pre-op eval for segmental resection of the transverse colon on the day of the admission (due to high grade dysplasia on recent colonocopy) when he was noted to be in A-fib with RVR. He has not had any chest pain, palpations, dyspnea, light headed sensation or pedal edema. He said he was 'cleaned out' for the surgery and felt dehydrated. Gen surgery asked for [facility] to admit. Cardiology consulting.  Assessment & Plan:  SIRS, Diarrhea secondary to yersinia enterocolitis  - confirmed with stool panel  - continue with supportive care - follow up on GI team recommendations   Atrial fibrillation with RVR - unknown duration  - cardiology team following, appreciate assistance  - currently on Cardizem 30 mg PO Q6 hours, has also received one dose of Atenolol yesterday  - continue Xarelto, plan for cardioversion today   Hypotension - due to dehydration in the setting of bowel prep and yersinia enterocolitis  - if SBP < 90, will give small bolus of IVF NS 500 cc at the time  Ascending aortic aneurysm  - followed closely by TCTS.  CKD stage III (baseline 1.6-1.9)  - remains near baseline - BMP in AM  Hypomagnesemia  - supplement and repeat Mg level in AM  DVT prophylaxis: Lovenox SQ Code Status: Full  Family Communication: Patient and wife at bedside  Disposition Plan: Keep in SDU due to hypotension   Consultants:   Cardiology   Procedures:   None  Antimicrobials:   None   Subjective: NO specific concerns this AM.   Objective: Vitals:   05/29/16 0900 05/29/16 1100 05/29/16 1119 05/29/16 1234  BP: 102/65 125/76 127/80 116/78  Pulse: (!) 121 (!) 114 (!) 109 (!) 115  Resp: (!) 24 (!) 35 20 (!) 25  Temp:    97.5 F (36.4 C)  TempSrc:    Oral  SpO2: 97% 98% 96% 96%  Weight:      Height:        Intake/Output Summary (Last 24 hours) at 05/29/16 1307 Last data filed at 05/29/16 0600  Gross per 24 hour  Intake           1503.5 ml  Output                0 ml  Net           1503.5 ml   Filed Weights   05/26/16 1700 05/26/16 2000 05/27/16 2034  Weight: 88.5 kg (195 lb 1.7 oz) 90 kg (198 lb 6.6 oz) 89.8 kg (197 lb 15.6 oz)    Examination:  General exam: Appears calm and comfortable  Respiratory system: Clear to auscultation. Respiratory effort normal. Cardiovascular system: IRRR. No JVD, murmurs, rubs, gallops or clicks. No pedal edema. Gastrointestinal system: Abdomen is nondistended, soft and nontender. No organomegaly or masses felt.  Central nervous system: Alert and oriented. No focal neurological deficits. Psychiatry: Judgement and insight appear normal. Mood & affect appropriate.   Data Reviewed: I have personally reviewed following labs and imaging studies  CBC:  Recent Labs Lab  05/26/16 1430 05/27/16 0331 05/28/16 0344 05/29/16 0350  WBC 6.7 6.5 6.1 5.4  HGB 15.0 14.1 13.8 13.4  HCT 43.9 41.7 41.3 39.9  MCV 86.2 86.5 87.5 88.5  PLT 108* 105* 105* 91*   Basic Metabolic Panel:  Recent Labs Lab 05/26/16 1342 05/27/16 0331 05/28/16 0344 05/29/16 0350 05/29/16 0421  NA 140 140 140 141  --   K 4.6 3.9 3.9 3.9  --   CL 110 114* 114* 116*  --   CO2 21* 19* 19* 17*  --   GLUCOSE 97 120* 112* 115*  --   BUN 21* 20 16 12   --   CREATININE 1.76* 1.88* 1.86* 1.75*  --   CALCIUM 9.0 8.7* 8.6* 8.3*  --   MG 1.4* 2.3 1.6*  --  1.5*   Coagulation Profile:  Recent Labs Lab 05/26/16 1342 05/26/16 1430  INR 1.01 1.03   Thyroid Function Tests:  Recent Labs  05/26/16 1430  TSH 3.148  FREET4  0.97   Urine analysis:    Component Value Date/Time   COLORURINE AMBER (A) 05/11/2016 2153   APPEARANCEUR CLEAR 05/11/2016 2153   LABSPEC 1.025 05/11/2016 2153   PHURINE 6.0 05/11/2016 2153   GLUCOSEU NEGATIVE 05/11/2016 2153   HGBUR NEGATIVE 05/11/2016 2153   BILIRUBINUR SMALL (A) 05/11/2016 2153   KETONESUR NEGATIVE 05/11/2016 2153   PROTEINUR NEGATIVE 05/11/2016 2153   NITRITE NEGATIVE 05/11/2016 2153   LEUKOCYTESUR TRACE (A) 05/11/2016 2153   Recent Results (from the past 240 hour(s))  Surgical pcr screen     Status: None   Collection Time: 05/21/16  8:44 AM  Result Value Ref Range Status   MRSA, PCR NEGATIVE NEGATIVE Final   Staphylococcus aureus NEGATIVE NEGATIVE Final    Comment:        The Xpert SA Assay (FDA approved for NASAL specimens in patients over 60 years of age), is one component of a comprehensive surveillance program.  Test performance has been validated by [facility] for patients greater than or equal to 77 year old. It is not intended to diagnose infection nor to guide or monitor treatment.   MRSA PCR Screening     Status: None   Collection Time: 05/26/16  5:01 PM  Result Value Ref Range Status   MRSA by PCR NEGATIVE NEGATIVE Final    Radiology Studies: Dg Chest 2 View  Result Date: 05/27/2016 CLINICAL DATA:  Atrial fibrillation and hypotension EXAM: CHEST  2 VIEW COMPARISON:  05/11/2016 FINDINGS: Cardiac shadow is within normal limits. Mild aortic calcifications are again seen and stable. The lungs are clear bilaterally. No acute bony abnormality is noted. Left shoulder replacement is seen. IMPRESSION: No active cardiopulmonary disease. Electronically Signed   By: Inez Catalina M.D.   On: 05/27/2016 15:46    Scheduled Meds: . [MAR Hold] calcium carbonate  2 tablet Oral BID  . [MAR Hold] colchicine  0.6 mg Oral Daily  . [MAR Hold] cyanocobalamin  1,000 mcg Intramuscular Q14 Days  . [MAR Hold] diltiazem  30 mg Oral Q6H  . [MAR Hold] gabapentin   100 mg Oral Daily  . [MAR Hold] gabapentin  200 mg Oral QHS  . [MAR Hold] ketotifen  1 drop Both Eyes BID  . [MAR Hold] magnesium oxide  400 mg Oral BID  . [MAR Hold] pantoprazole  40 mg Oral Daily  . [MAR Hold] PARoxetine  40 mg Oral QHS  . [MAR Hold] rivaroxaban  15 mg Oral Q supper  . [facility] Hold]  saccharomyces boulardii  250 mg Oral q1800  . [MAR Hold] sodium chloride flush  3 mL Intravenous Q12H  . [MAR Hold] traZODone  150 mg Oral QHS   Continuous Infusions: . sodium chloride 75 mL/hr at 05/29/16 0300  . sodium chloride Stopped (05/28/16 1903)     LOS: 3 days   Time spent: 20 minutes   Faye Ramsay, MD [facility] Pager [phone]  If 7PM-7AM, please contact night-coverage [email or web address] Password [email or web address] 05/29/2016, 1:07 PM
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
## 2016-05-29 NOTE — Progress Notes (Signed)
Upon entering pt room found pt oob in BR.  Pt A/Ox4, explained to pt the importance of calling for help before exiting his bed.  Pt insisted he did not want to have BM in his bed and was somewhat resistant but very nice about it.  Pt bed alarm on and pt instructed to call when/if he needed help for any reason. Charge, RN at bedside as well and reinforced information.

## 2016-05-29 NOTE — Transfer of Care (Signed)
Immediate Anesthesia Transfer of Care Note  Patient: Derek [name]  Procedure(s) Performed: Procedure(s): CARDIOVERSION (N/A) TRANSESOPHAGEAL ECHOCARDIOGRAM (TEE) (N/A)  Patient Location: Endoscopy Unit  Anesthesia Type:MAC  Level of Consciousness: awake, alert  and oriented  Airway & Oxygen Therapy: Patient Spontanous Breathing and Patient connected to nasal cannula oxygen  Post-op Assessment: Report given to RN, Post -op Vital signs reviewed and stable and Patient moving all extremities X 4  Post vital signs: Reviewed and stable  Last Vitals:  Vitals:   05/29/16 1119 05/29/16 1234  BP: 127/80 116/78  Pulse: (!) 109 (!) 115  Resp: 20 (!) 25  Temp:  36.4 C    Last Pain:  Vitals:   05/29/16 1234  TempSrc: Oral  PainSc:       Patients Stated Pain Goal: 2 (67/70/34 0352)  Complications: No apparent anesthesia complications

## 2016-05-29 NOTE — Progress Notes (Signed)
Paged on call physician about change in status. Pt currently in rate controlled afibb. Awaiting further orders.

## 2016-05-29 NOTE — Op Note (Signed)
INDICATIONS: atrial fibrillation  PROCEDURE:   Informed consent was obtained prior to the procedure. The risks, benefits and alternatives for the procedure were discussed and the patient comprehended these risks.  Risks include, but are not limited to, cough, sore throat, vomiting, nausea, somnolence, esophageal and stomach trauma or perforation, bleeding, low blood pressure, aspiration, pneumonia, infection, trauma to the teeth and death.    After a procedural time-out, the oropharynx was anesthetized with 20% benzocaine spray.   During this procedure the patient was administered IV propofol by Anesthesiology.  The transesophageal probe was inserted in the esophagus and stomach without difficulty and multiple views were obtained.  The patient was kept under observation until the patient left the procedure room.  The patient left the procedure room in stable condition.   Agitated microbubble saline contrast was not administered.  COMPLICATIONS:    There were no immediate complications.  FINDINGS:  No LA thrombus. Normal LVEF. Mildly dilated left atrium. Moderate size aneurysm of the ascending aorta. Mild aortic insufficiency.  RECOMMENDATIONS:     Proceed with DCCV.  Time Spent Directly with the Patient:  30 minutes   Derek [name] 05/29/2016, 1:28 PM

## 2016-05-29 NOTE — Progress Notes (Signed)
Paged once again about rate controlled AFIBB. Answering service understands that this is the third time paged.

## 2016-05-30 ENCOUNTER — Inpatient Hospital Stay (HOSPITAL_COMMUNITY): Payer: Medicare Other

## 2016-05-30 LAB — BASIC METABOLIC PANEL
ANION GAP: 7 (ref 5–15)
BUN: 11 mg/dL (ref 6–20)
CALCIUM: 8.3 mg/dL — AB (ref 8.9–10.3)
CO2: 15 mmol/L — ABNORMAL LOW (ref 22–32)
CREATININE: 1.68 mg/dL — AB (ref 0.61–1.24)
Chloride: 115 mmol/L — ABNORMAL HIGH (ref 101–111)
GFR, EST AFRICAN AMERICAN: 45 mL/min — AB (ref >60)
GFR, EST NON AFRICAN AMERICAN: 39 mL/min — AB (ref >60)
Glucose, Bld: 109 mg/dL — ABNORMAL HIGH (ref 65–99)
Potassium: 4.7 mmol/L (ref 3.5–5.1)
SODIUM: 137 mmol/L (ref 135–145)

## 2016-05-30 LAB — PROCALCITONIN

## 2016-05-30 LAB — CBC
HCT: 42.9 % (ref 39.0–52.0)
HEMOGLOBIN: 14.4 g/dL (ref 13.0–17.0)
MCH: 29.8 pg (ref 26.0–34.0)
MCHC: 33.6 g/dL (ref 30.0–36.0)
MCV: 88.8 fL (ref 78.0–100.0)
PLATELETS: 96 10*3/uL — AB (ref 150–400)
RBC: 4.83 MIL/uL (ref 4.22–5.81)
RDW: 14.8 % (ref 11.5–15.5)
WBC: 7.7 10*3/uL (ref 4.0–10.5)

## 2016-05-30 LAB — TROPONIN I

## 2016-05-30 MED ORDER — CIPROFLOXACIN IN D5W 400 MG/200ML IV SOLN: 400.0000 mg | Freq: Two times a day (BID) | INTRAVENOUS | Status: DC

## 2016-05-30 MED ORDER — DILTIAZEM HCL-DEXTROSE 100-5 MG/100ML-% IV SOLN (PREMIX)
5.0000 mg/h | INTRAVENOUS | Status: DC
  Administered 2016-05-30: 5 mg/h via INTRAVENOUS
  Filled 2016-05-30: qty 100

## 2016-05-30 MED ORDER — DILTIAZEM LOAD VIA INFUSION
10.0000 mg | Freq: Once | INTRAVENOUS | Status: AC
  Administered 2016-05-30: 10 mg via INTRAVENOUS
  Filled 2016-05-30: qty 10

## 2016-05-30 MED ORDER — AMIODARONE HCL IN DEXTROSE 360-4.14 MG/200ML-% IV SOLN
60.0000 mg/h | INTRAVENOUS | Status: AC
  Administered 2016-05-30: 60 mg/h via INTRAVENOUS
  Filled 2016-05-30 (×2): qty 200

## 2016-05-30 MED ORDER — PROMETHAZINE HCL 25 MG/ML IJ SOLN
12.5000 mg | Freq: Four times a day (QID) | INTRAMUSCULAR | Status: DC | PRN
  Administered 2016-05-30: 12.5 mg via INTRAVENOUS
  Filled 2016-05-30: qty 1

## 2016-05-30 MED ORDER — SULFAMETHOXAZOLE-TRIMETHOPRIM 800-160 MG PO TABS
1.0000 | ORAL_TABLET | Freq: Two times a day (BID) | ORAL | Status: DC
  Administered 2016-05-30 – 2016-05-31 (×3): 1 via ORAL
  Filled 2016-05-30 (×3): qty 1

## 2016-05-30 MED ORDER — AMIODARONE HCL IN DEXTROSE 360-4.14 MG/200ML-% IV SOLN
30.0000 mg/h | INTRAVENOUS | Status: DC
  Administered 2016-05-30 – 2016-05-31 (×4): 30 mg/h via INTRAVENOUS
  Filled 2016-05-30 (×3): qty 200

## 2016-05-30 MED ORDER — SACCHAROMYCES BOULARDII 250 MG PO CAPS
250.0000 mg | ORAL_CAPSULE | Freq: Two times a day (BID) | ORAL | Status: DC
  Administered 2016-05-30 – 2016-06-02 (×7): 250 mg via ORAL
  Filled 2016-05-30 (×7): qty 1

## 2016-05-30 NOTE — Progress Notes (Signed)
Patient Name: Derek [name] Date of Encounter: 05/30/2016  Hospital Problem List     Principal Problem:   Atrial fibrillation with RVR [facility]) Active Problems:   Gout   Ascending aortic aneurysm (HCC)   Crohn's ileocolitis (HCC)   CKD (chronic kidney disease) stage 3, GFR 30-59 ml/min   Thrombocytopenia (HCC)   Enteric hyperoxaluria (HCC)   Secondary hyperparathyroidism (HCC)   Lung nodule   Peripheral neuropathic pain   GERD (gastroesophageal reflux disease)   Essential hypertension   Hypotension   Hypomagnesemia   Chronic diarrhea   Infectious diarrhea    Patient Profile     Derek [name] is a 75 year old patient with chronic systolic congestive heart failure and EF 45-50% as well as ascending aortic aneurysm. He has a history of Crohn's disease and prior ileocectomy. Recently he was found to have a possible early colonic lesion and was scheduled for elective partial resection . On presentation in the short stay area he was noted to be in A. fib with RVR.   Subjective   Nauseated and throwing up this morning.  Denies chest pain or SOB.  Inpatient Medications    . calcium carbonate  2 tablet Oral BID  . colchicine  0.6 mg Oral Daily  . cyanocobalamin  1,000 mcg Intramuscular Q14 Days  . diltiazem  30 mg Oral Q6H  . gabapentin  100 mg Oral Daily  . gabapentin  200 mg Oral QHS  . ketotifen  1 drop Both Eyes BID  . magnesium oxide  400 mg Oral BID  . pantoprazole  40 mg Oral Daily  . PARoxetine  40 mg Oral QHS  . rivaroxaban  15 mg Oral Q supper  . saccharomyces boulardii  250 mg Oral q1800  . sodium chloride flush  3 mL Intravenous Q12H  . traZODone  150 mg Oral QHS    Vital Signs    Vitals:   05/30/16 0400 05/30/16 0500 05/30/16 0559 05/30/16 0700  BP: 116/72 132/81 132/81 125/71  Pulse: 85 (!) 109  (!) 143  Resp: (!) 28 (!) 25  (!) 31  Temp: 98.7 F (37.1 C)     TempSrc: Oral     SpO2: 91% 94%  96%  Weight:  200 lb 9.9 oz (91 kg)    Height:          Intake/Output Summary (Last 24 hours) at 05/30/16 0807 Last data filed at 05/30/16 0400  Gross per 24 hour  Intake             2440 ml  Output                2 ml  Net             2438 ml   Filed Weights   05/27/16 2034 05/29/16 2000 05/30/16 0500  Weight: 197 lb 15.6 oz (89.8 kg) 201 lb 8 oz (91.4 kg) 200 lb 9.9 oz (91 kg)    Physical Exam    GEN: Well nourished, well developed, in no acute distress but uncomfortable.  Neck: Supple, no JVD, carotid bruits, or masses. Cardiac: Irregular no rubs, or gallops. No clubbing, cyanosis, no edema.  Radials/DP/PT 2+ and equal bilaterally.  Respiratory:  Respirations  regular and unlabored, clear to auscultation bilaterally. GI: Distended, nontender, nondistended, BS + x 4. Neuro:  Strength and sensation are intact.   Labs    CBC  Recent Labs  05/29/16 0350 05/30/16 0345  WBC 5.4 7.7  HGB 13.4  14.4  HCT 39.9 42.9  MCV 88.5 88.8  PLT 91* 96*   Basic Metabolic Panel  Recent Labs  05/28/16 0344 05/29/16 0350 05/29/16 0421 05/30/16 0345  NA 140 141  --  137  K 3.9 3.9  --  4.7  CL 114* 116*  --  115*  CO2 19* 17*  --  15*  GLUCOSE 112* 115*  --  109*  BUN 16 12  --  11  CREATININE 1.86* 1.75*  --  1.68*  CALCIUM 8.6* 8.3*  --  8.3*  MG 1.6*  --  1.5*  --    Liver Function Tests No results for input(s): AST, ALT, ALKPHOS, BILITOT, PROT, ALBUMIN in the last 72 hours. No results for input(s): LIPASE, AMYLASE in the last 72 hours. Cardiac Enzymes No results for input(s): CKTOTAL, CKMB, CKMBINDEX, TROPONINI in the last 72 hours. BNP Invalid input(s): POCBNP D-Dimer No results for input(s): DDIMER in the last 72 hours. Hemoglobin A1C No results for input(s): HGBA1C in the last 72 hours. Fasting Lipid Panel No results for input(s): CHOL, HDL, LDLCALC, TRIG, CHOLHDL, LDLDIRECT in the last 72 hours. Thyroid Function Tests No results for input(s): TSH, T4TOTAL, T3FREE, THYROIDAB in the last 72 hours.  Invalid  input(s): FREET3  Telemetry    Atrial fib with RVR  ECG    NA  Radiology    Dg Chest 2 View  Result Date: 05/27/2016 CLINICAL DATA:  Atrial fibrillation and hypotension EXAM: CHEST  2 VIEW COMPARISON:  05/11/2016 FINDINGS: Cardiac shadow is within normal limits. Mild aortic calcifications are again seen and stable. The lungs are clear bilaterally. No acute bony abnormality is noted. Left shoulder replacement is seen. IMPRESSION: No active cardiopulmonary disease. Electronically Signed   By: Inez Catalina M.D.   On: 05/27/2016 15:46   Dg Abd 1 View  Result Date: 05/13/2016 CLINICAL DATA:  Followup small bowel obstruction. EXAM: ABDOMEN - 1 VIEW COMPARISON:  05/11/2016 FINDINGS: Nasogastric tube tip again seen within the proximal stomach. Mild decrease in dilatation of small bowel loops is seen since previous study. Air-fluid levels cannot be assessed due to lack over recto view. Multiple surgical clips and staples seen within the right abdomen. IMPRESSION: Mild decrease in dilated small bowel loops since prior study. Electronically Signed   By: Earle Gell M.D.   On: 05/13/2016 07:53   Ct Abdomen Pelvis W Contrast  Result Date: 05/11/2016 CLINICAL DATA:  Unable to have bowel movements, with abdominal distention, nausea and vomiting. Personal history of bowel obstructions. Initial encounter. EXAM: CT ABDOMEN AND PELVIS WITH CONTRAST TECHNIQUE: Multidetector CT imaging of the abdomen and pelvis was performed using the standard protocol following bolus administration of intravenous contrast. CONTRAST:  18m ISOVUE-300 IOPAMIDOL (ISOVUE-300) INJECTION 61% COMPARISON:  CT of the abdomen and pelvis from 03/09/2012, and MRI of the brain performed 03/03/2013 FINDINGS: Lower chest: Mild left basilar atelectasis is noted. A 6 mm nodule is noted at the left lower lobe (image 3 of 67). Scattered coronary artery calcifications are seen. Hepatobiliary: A small calcified granuloma is noted at the hepatic  dome. The liver is otherwise unremarkable. The patient is status post cholecystectomy, with clips noted at the gallbladder fossa. The common bile duct remains normal in caliber. Pancreas: The pancreas is within normal limits. Spleen: The spleen is enlarged, measuring 16.0 cm in length. Adrenals/Urinary Tract: The adrenal glands are grossly unremarkable in appearance. There is moderate bilateral renal atrophy, with scattered bilateral renal cysts. Small nonobstructing bilateral renal stones are  seen, measuring up to 3 mm in size. Mild nonspecific perinephric stranding is noted bilaterally. Stomach/Bowel: There is dilatation of small-bowel loops up to 4.6 cm in maximal diameter, with gradual fecalization at the distal ileum, and decompression distal to focal clumping and mild ileal wall thickening at the right lower quadrant. This likely reflects at least partial small bowel obstruction, due to an underlying adhesion. Residual fluid and air is noted within the ascending colon. The patient's ileocolic anastomosis is grossly unremarkable in appearance. The more distal colon is decompressed and grossly unremarkable in appearance. The stomach is largely filled with air and fluid, with an enteric tube seen ending at the body of the stomach. Vascular/Lymphatic: Scattered calcification is seen along the abdominal aorta and its branches. The abdominal aorta is otherwise grossly unremarkable. The inferior vena cava is grossly unremarkable. No retroperitoneal lymphadenopathy is seen. No pelvic sidewall lymphadenopathy is identified. Reproductive: The bladder is decompressed and not well characterized. The prostate remains normal in size. Other: No additional soft tissue abnormalities are seen. Musculoskeletal: No acute osseous abnormalities are identified. The visualized musculature is unremarkable in appearance. IMPRESSION: 1. Dilatation of small-bowel loops to 4.6 cm in maximal diameter, with gradual fecalization at the  distal ileum, and decompression distal to focal clumping and mild ileal wall thickening at the right lower quadrant. This likely reflects at least partial small bowel obstruction, due to an underlying adhesion. 2. Ileocolic anastomosis is grossly unremarkable in appearance. 3. Stomach largely filled with air and fluid. 4. Scattered coronary artery calcifications seen. 5. Moderate bilateral renal atrophy, with scattered bilateral renal cysts. Nonobstructing bilateral renal stones measure up to 3 mm in size. 6. Scattered aortic atherosclerosis noted. 7. Mild left basilar atelectasis noted. 8. Splenomegaly noted. 9. **An incidental finding of potential clinical significance has been found. 6 mm nodule at the left lower lung lobe. Non-contrast chest CT at 6-12 months is recommended. If the nodule is stable at time of repeat CT, then future CT at 18-24 months (from today's scan) is considered optional for low-risk patients, but is recommended for high-risk patients. This recommendation follows the consensus statement: Guidelines for Management of Incidental Pulmonary Nodules Detected on CT Images: From the Fleischner Society 2017; Radiology 2017; 284:228-243.** Electronically Signed   By: Garald Balding M.D.   On: 05/11/2016 23:21   Dg Abd Acute W/chest  Result Date: 05/11/2016 CLINICAL DATA:  Abdominal distention, abdominal pain, nausea and vomiting. History of Crohn's disease and bowel obstruction. EXAM: DG ABDOMEN ACUTE W/ 1V CHEST COMPARISON:  CT abdomen/ pelvis performed subsequently available at time of radiograph interpretation. FINDINGS: Scattered atelectasis at the lung bases. Heart is upper limits of normal in size. There is atherosclerosis of the thoracic aorta. No pleural fluid. Enteric tube in place, tip and side port below the diaphragm in the stomach. Stomach is distended with air-fluid level. Scattered air-fluid levels throughout small bowel. Enteric sutures in the right lower quadrant of the abdomen.  No free air. Minimal stool burden throughout the colon. Cholecystectomy clips in the right upper quadrant. There are vascular calcifications. No acute osseous abnormalities are seen. IMPRESSION: 1. Distended stomach with air-fluid level, and air-fluid levels throughout small bowel. Findings suggest small bowel obstruction. 2. Enteric tube in the stomach. 3. Bibasilar atelectasis.  Thoracic aortic atherosclerosis. Electronically Signed   By: Jeb Levering M.D.   On: 05/11/2016 22:54    Assessment & Plan    ATRIAL FIB:  He has TEE/DCCV.  However, back in atrial fib with RVR.  Continue Xarelto and rate control.    Start amiodarone IV.    CHRONIC SYSTOLIC AND DIASTOLIC HF:   EF 46%.    CKD:  Creat is slightly improved.  Follow.    Signed, Minus Breeding, MD  05/30/2016, 8:07 AM

## 2016-05-30 NOTE — Progress Notes (Signed)
Pt HR increased, pt nauseated, denies any CP. Ekg shows A-fib. Paged [facility] new orders received and initiated.  Pt wife at the bedside.

## 2016-05-30 NOTE — Progress Notes (Signed)
Patient ID: Derek [name], male   DOB: 18-Aug-1940, 75 y.o.   MRN: [id number]    PROGRESS NOTE    Derek [name]  JJO:[id number] DOB: 1941/07/23 DOA: 05/26/2016  PCP: Purvis Kilts, MD   Brief Narrative:  75 y.o. male with known colon poylps, AAA, mild to mod AI followed by cardiology, Crohn's disease, B12 deficiency, GERD, Gout, Hep C, PE, chronic thrombocytopenia,  PTSD, Lung nodule, HTN, RLS who was in Pre-op eval for segmental resection of the transverse colon on the day of the admission (due to high grade dysplasia on recent colonocopy) when he was noted to be in A-fib with RVR. He has not had any chest pain, palpations, dyspnea, light headed sensation or pedal edema. He said he was 'cleaned out' for the surgery and felt dehydrated. Gen surgery asked for [facility] to admit. Cardiology consulting.  Assessment & Plan:  SIRS, Diarrhea secondary to yersinia enterocolitis  - confirmed with stool panel  - continue with supportive care as recommended by GI team - pt reports persistent diarrhea, I discussed with our ID doctor Dr. Linus Salmons, he recommended trying Bactrim to see if symptoms will improve  - will also add florastor  Vomiting  - this AM, not clear why - if persists, will consider abd imaging study - allow antiemetics as needed   Atrial fibrillation with RVR - unknown duration  - cardiology team following, appreciate assistance  - s/p cardioversion which was done 10/27, initially in NSR but converted to a-fib, this AM back in NSR - keep on tele for now  Hypotension - due to dehydration in the setting of bowel prep and yersinia enterocolitis  - if SBP < 90, will give small bolus of IVF NS 500 cc at the time  Ascending aortic aneurysm  - followed closely by [facility].  CKD stage III (baseline 1.6-1.9)  - remains near baseline - BMP in AM  Hypomagnesemia  - supplement and repeat Mg level in AM  DVT prophylaxis: Lovenox SQ Code Status: Full  Family  Communication: Patient and wife at bedside  Disposition Plan: Keep in SDU due to hypotension   Consultants:   Cardiology   Procedures:   Cardioversion 10/27 -->  Antimicrobials:   Bactrim 10/27 -->   Subjective: One episode of vomiting and diarrhea this AM.   Objective: Vitals:   05/30/16 1408 05/30/16 1418 05/30/16 1500 05/30/16 1600  BP: (!) 88/48 (!) 95/44 (!) 100/48 (!) 105/52  Pulse: 78 77 80 84  Resp: 18 (!) 21 (!) 34 (!) 43  Temp:      TempSrc:      SpO2: 94% 94% 93% 93%  Weight:      Height:        Intake/Output Summary (Last 24 hours) at 05/30/16 1717 Last data filed at 05/30/16 1600  Gross per 24 hour  Intake          2342.59 ml  Output                2 ml  Net          2340.59 ml   Filed Weights   05/27/16 2034 05/29/16 2000 05/30/16 0500  Weight: 89.8 kg (197 lb 15.6 oz) 91.4 kg (201 lb 8 oz) 91 kg (200 lb 9.9 oz)    Examination:  General exam: Appears calm and comfortable  Respiratory system: Clear to auscultation. Respiratory effort normal. Cardiovascular system: RRR. No JVD, murmurs, rubs, gallops or clicks. No pedal edema. Gastrointestinal system:  Abdomen is nondistended, soft and nontender. No organomegaly or masses felt.  Central nervous system: Alert and oriented. No focal neurological deficits.  Data Reviewed: I have personally reviewed following labs and imaging studies  CBC:  Recent Labs Lab 05/26/16 1430 05/27/16 0331 05/28/16 0344 05/29/16 0350 05/30/16 0345  WBC 6.7 6.5 6.1 5.4 7.7  HGB 15.0 14.1 13.8 13.4 14.4  HCT 43.9 41.7 41.3 39.9 42.9  MCV 86.2 86.5 87.5 88.5 88.8  PLT 108* 105* 105* 91* 96*   Basic Metabolic Panel:  Recent Labs Lab 05/26/16 1342 05/27/16 0331 05/28/16 0344 05/29/16 0350 05/29/16 0421 05/30/16 0345  NA 140 140 140 141  --  137  K 4.6 3.9 3.9 3.9  --  4.7  CL 110 114* 114* 116*  --  115*  CO2 21* 19* 19* 17*  --  15*  GLUCOSE 97 120* 112* 115*  --  109*  BUN 21* 20 16 12   --  11    CREATININE 1.76* 1.88* 1.86* 1.75*  --  1.68*  CALCIUM 9.0 8.7* 8.6* 8.3*  --  8.3*  MG 1.4* 2.3 1.6*  --  1.5*  --    Coagulation Profile:  Recent Labs Lab 05/26/16 1342 05/26/16 1430  INR 1.01 1.03   Urine analysis:    Component Value Date/Time   COLORURINE AMBER (A) 05/11/2016 2153   APPEARANCEUR CLEAR 05/11/2016 2153   LABSPEC 1.025 05/11/2016 2153   PHURINE 6.0 05/11/2016 2153   GLUCOSEU NEGATIVE 05/11/2016 2153   HGBUR NEGATIVE 05/11/2016 2153   BILIRUBINUR SMALL (A) 05/11/2016 2153   KETONESUR NEGATIVE 05/11/2016 2153   PROTEINUR NEGATIVE 05/11/2016 2153   NITRITE NEGATIVE 05/11/2016 2153   LEUKOCYTESUR TRACE (A) 05/11/2016 2153   Recent Results (from the past 240 hour(s))  Surgical pcr screen     Status: None   Collection Time: 05/21/16  8:44 AM  Result Value Ref Range Status   MRSA, PCR NEGATIVE NEGATIVE Final   Staphylococcus aureus NEGATIVE NEGATIVE Final    Comment:        The Xpert SA Assay (FDA approved for NASAL specimens in patients over 38 years of age), is one component of a comprehensive surveillance program.  Test performance has been validated by [facility] for patients greater than or equal to 71 year old. It is not intended to diagnose infection nor to guide or monitor treatment.   MRSA PCR Screening     Status: None   Collection Time: 05/26/16  5:01 PM  Result Value Ref Range Status   MRSA by PCR NEGATIVE NEGATIVE Final    Radiology Studies: No results found.  Scheduled Meds: . calcium carbonate  2 tablet Oral BID  . colchicine  0.6 mg Oral Daily  . cyanocobalamin  1,000 mcg Intramuscular Q14 Days  . gabapentin  100 mg Oral Daily  . gabapentin  200 mg Oral QHS  . ketotifen  1 drop Both Eyes BID  . magnesium oxide  400 mg Oral BID  . pantoprazole  40 mg Oral Daily  . PARoxetine  40 mg Oral QHS  . rivaroxaban  15 mg Oral Q supper  . saccharomyces boulardii  250 mg Oral BID  . sodium chloride flush  3 mL Intravenous Q12H  .  sulfamethoxazole-trimethoprim  1 tablet Oral Q12H  . traZODone  150 mg Oral QHS   Continuous Infusions: . sodium chloride 75 mL/hr at 05/30/16 1600  . sodium chloride Stopped (05/28/16 1903)  . amiodarone 30 mg/hr (05/30/16 1600)  .  diltiazem (CARDIZEM) infusion Stopped (05/30/16 1420)     LOS: 4 days   Time spent: 20 minutes   Faye Ramsay, MD [facility] Pager [phone]  If 7PM-7AM, please contact night-coverage [email or web address] Password [email or web address] 05/30/2016, 5:17 PM

## 2016-05-30 NOTE — Progress Notes (Signed)
Pt converted to sinus rhythm at approximately 0900, 05/30/2016.  Vital signs are stable.  Cardiology PA made aware, orders in place to continue low dose Diltiazem IV, and to continue Amiodarone IV.   Will continue to monitor.

## 2016-05-31 ENCOUNTER — Encounter (HOSPITAL_COMMUNITY): Payer: Self-pay | Admitting: Cardiovascular Disease

## 2016-05-31 LAB — BASIC METABOLIC PANEL
ANION GAP: 8 (ref 5–15)
BUN: 20 mg/dL (ref 6–20)
CHLORIDE: 114 mmol/L — AB (ref 101–111)
CO2: 18 mmol/L — ABNORMAL LOW (ref 22–32)
Calcium: 8.2 mg/dL — ABNORMAL LOW (ref 8.9–10.3)
Creatinine, Ser: 2.06 mg/dL — ABNORMAL HIGH (ref 0.61–1.24)
GFR calc Af Amer: 35 mL/min — ABNORMAL LOW (ref >60)
GFR, EST NON AFRICAN AMERICAN: 30 mL/min — AB (ref >60)
Glucose, Bld: 113 mg/dL — ABNORMAL HIGH (ref 65–99)
POTASSIUM: 3.9 mmol/L (ref 3.5–5.1)
SODIUM: 140 mmol/L (ref 135–145)

## 2016-05-31 LAB — CBC
HCT: 39.9 % (ref 39.0–52.0)
HEMOGLOBIN: 13.1 g/dL (ref 13.0–17.0)
MCH: 29.2 pg (ref 26.0–34.0)
MCHC: 32.8 g/dL (ref 30.0–36.0)
MCV: 89.1 fL (ref 78.0–100.0)
Platelets: 88 10*3/uL — ABNORMAL LOW (ref 150–400)
RBC: 4.48 MIL/uL (ref 4.22–5.81)
RDW: 15 % (ref 11.5–15.5)
WBC: 5.8 10*3/uL (ref 4.0–10.5)

## 2016-05-31 LAB — URINALYSIS, ROUTINE W REFLEX MICROSCOPIC
Glucose, UA: NEGATIVE mg/dL
HGB URINE DIPSTICK: NEGATIVE
Ketones, ur: NEGATIVE mg/dL
LEUKOCYTES UA: NEGATIVE
Nitrite: NEGATIVE
PROTEIN: NEGATIVE mg/dL
SPECIFIC GRAVITY, URINE: 1.023 (ref 1.005–1.030)
pH: 5.5 (ref 5.0–8.0)

## 2016-05-31 LAB — MAGNESIUM: Magnesium: 1.4 mg/dL — ABNORMAL LOW (ref 1.7–2.4)

## 2016-05-31 MED ORDER — DEXTROSE 5 % IV SOLN
2.0000 g | INTRAVENOUS | Status: DC
  Administered 2016-05-31 – 2016-06-01 (×2): 2 g via INTRAVENOUS
  Filled 2016-05-31 (×3): qty 2

## 2016-05-31 MED ORDER — DOXYCYCLINE HYCLATE 100 MG IV SOLR
100.0000 mg | Freq: Two times a day (BID) | INTRAVENOUS | Status: DC
  Administered 2016-05-31 – 2016-06-02 (×4): 100 mg via INTRAVENOUS
  Filled 2016-05-31 (×5): qty 100

## 2016-05-31 MED ORDER — PROMETHAZINE HCL 25 MG/ML IJ SOLN: 12.5000 mg | Freq: Four times a day (QID) | INTRAMUSCULAR | Status: DC | PRN

## 2016-05-31 MED ORDER — PROMETHAZINE HCL 25 MG/ML IJ SOLN
12.5000 mg | Freq: Once | INTRAMUSCULAR | Status: AC
  Administered 2016-05-31: 12.5 mg via INTRAVENOUS
  Filled 2016-05-31: qty 1

## 2016-05-31 MED ORDER — MAGNESIUM SULFATE 2 GM/50ML IV SOLN
2.0000 g | Freq: Once | INTRAVENOUS | Status: AC
  Administered 2016-05-31: 2 g via INTRAVENOUS
  Filled 2016-05-31: qty 50

## 2016-05-31 NOTE — Progress Notes (Signed)
Patient ID: Derek [name], male   DOB: 06/29/1941, 75 y.o.   MRN: [id number]    PROGRESS NOTE    Derek [name]  CMK:[id number] DOB: 01/12/1941 DOA: 05/26/2016  PCP: Purvis Kilts, MD   Brief Narrative:  75 y.o. male with known colon poylps, AAA, mild to mod AI followed by cardiology, Crohn's disease, B12 deficiency, GERD, Gout, Hep C, PE, chronic thrombocytopenia,  PTSD, Lung nodule, HTN, RLS who was in Pre-op eval for segmental resection of the transverse colon on the day of the admission (due to high grade dysplasia on recent colonocopy) when he was noted to be in A-fib with RVR. He has not had any chest pain, palpations, dyspnea, light headed sensation or pedal edema. He said he was 'cleaned out' for the surgery and felt dehydrated. Gen surgery asked for [facility] to admit. Cardiology consulting.  Assessment & Plan:  SIRS, Diarrhea secondary to yersinia enterocolitis  - confirmed with stool panel  - continue with supportive care as recommended by GI team - pt reports persistent diarrhea, I discussed with our ID doctor Dr. Linus Salmons, he recommended trying Bactrim to see if symptoms will improve but pt still with nausea and unable to tolerate much of the PO - will change ABX to IV doxy and rocephin, this will cover yersinia and potential PNA as was suggested on CXR - will also add florastor  SIRS secondary to ? LLL PNA - no fevers this AM but had T up to 101.2 F - currently on ABX doxycycline and rocephin  - no productive sputum to collect at this time but will monitor closely   Vomiting  - this AM again, not clear why - allow antiemetics as needed, phenergan seems to help    Atrial fibrillation with RVR - unknown duration  - cardiology team following, appreciate assistance  - s/p cardioversion which was done 10/27, initially in NSR but converted to a-fib - in NSR this AM  Hypotension - due to dehydration in the setting of bowel prep and yersinia  enterocolitis  - SBP in 110's this AM, continue with gentle hydration, NS at 75 cc/hr   Ascending aortic aneurysm  - followed closely by [facility].  CKD stage III (baseline 1.6-1.9)  - Cr up this AM and suspect from poor oral intake and vomiting - keep on IVF for now  - BMP in AM  Hypomagnesemia  - supplement and repeat Mg level in AM  DVT prophylaxis: Lovenox SQ Code Status: Full  Family Communication: Patient and wife at bedside  Disposition Plan: Keep in SDU due to hypotension   Consultants:   Cardiology   Procedures:   Cardioversion 10/27 -->  Antimicrobials:   Bactrim 10/27 --> 10//29  Doxy and Rocephin 10/29 -->  Subjective: One episode of vomiting and persistent diarrhea this AM.   Objective: Vitals:   05/31/16 0800 05/31/16 0900 05/31/16 1200 05/31/16 1300  BP: 115/62 109/68 113/64 117/65  Pulse:  (!) 58 62 (!) 52  Resp: (!) 29 17 (!) 23 (!) 23  Temp: 97.8 F (36.6 C)     TempSrc: Oral     SpO2: 98% 99% 97% 97%  Weight:      Height:        Intake/Output Summary (Last 24 hours) at 05/31/16 1417 Last data filed at 05/31/16 1000  Gross per 24 hour  Intake          2411.01 ml  Output  0 ml  Net          2411.01 ml   Filed Weights   05/29/16 2000 05/30/16 0500 05/31/16 0357  Weight: 91.4 kg (201 lb 8 oz) 91 kg (200 lb 9.9 oz) 91.5 kg (201 lb 11.5 oz)    Examination:  General exam: Appears calm and comfortable  Respiratory system: Clear to auscultation. Respiratory effort normal. Cardiovascular system: RRR. No JVD, murmurs, rubs, gallops or clicks. No pedal edema. Gastrointestinal system: Abdomen is nondistended, soft and nontender. No organomegaly or masses felt.  Central nervous system: Alert and oriented. No focal neurological deficits.  Data Reviewed: I have personally reviewed following labs and imaging studies  CBC:  Recent Labs Lab 05/27/16 0331 05/28/16 0344 05/29/16 0350 05/30/16 0345 05/31/16 0519  WBC 6.5 6.1  5.4 7.7 5.8  HGB 14.1 13.8 13.4 14.4 13.1  HCT 41.7 41.3 39.9 42.9 39.9  MCV 86.5 87.5 88.5 88.8 89.1  PLT 105* 105* 91* 96* 88*   Basic Metabolic Panel:  Recent Labs Lab 05/26/16 1342 05/27/16 0331 05/28/16 0344 05/29/16 0350 05/29/16 0421 05/30/16 0345 05/31/16 0519  NA 140 140 140 141  --  137 140  K 4.6 3.9 3.9 3.9  --  4.7 3.9  CL 110 114* 114* 116*  --  115* 114*  CO2 21* 19* 19* 17*  --  15* 18*  GLUCOSE 97 120* 112* 115*  --  109* 113*  BUN 21* 20 16 12   --  11 20  CREATININE 1.76* 1.88* 1.86* 1.75*  --  1.68* 2.06*  CALCIUM 9.0 8.7* 8.6* 8.3*  --  8.3* 8.2*  MG 1.4* 2.3 1.6*  --  1.5*  --  1.4*   Coagulation Profile:  Recent Labs Lab 05/26/16 1342 05/26/16 1430  INR 1.01 1.03   Urine analysis:    Component Value Date/Time   COLORURINE AMBER (A) 05/31/2016 0731   APPEARANCEUR CLEAR 05/31/2016 0731   LABSPEC 1.023 05/31/2016 0731   PHURINE 5.5 05/31/2016 0731   GLUCOSEU NEGATIVE 05/31/2016 0731   HGBUR NEGATIVE 05/31/2016 0731   BILIRUBINUR SMALL (A) 05/31/2016 0731   KETONESUR NEGATIVE 05/31/2016 0731   PROTEINUR NEGATIVE 05/31/2016 0731   NITRITE NEGATIVE 05/31/2016 0731   LEUKOCYTESUR NEGATIVE 05/31/2016 0731   Recent Results (from the past 240 hour(s))  Surgical pcr screen     Status: None   Collection Time: 05/21/16  8:44 AM  Result Value Ref Range Status   MRSA, PCR NEGATIVE NEGATIVE Final   Staphylococcus aureus NEGATIVE NEGATIVE Final    Comment:        The Xpert SA Assay (FDA approved for NASAL specimens in patients over 68 years of age), is one component of a comprehensive surveillance program.  Test performance has been validated by [facility] for patients greater than or equal to 34 year old. It is not intended to diagnose infection nor to guide or monitor treatment.   MRSA PCR Screening     Status: None   Collection Time: 05/26/16  5:01 PM  Result Value Ref Range Status   MRSA by PCR NEGATIVE NEGATIVE Final    Radiology  Studies: Dg Chest Port 1 View  Result Date: 05/30/2016 CLINICAL DATA:  Hypertension. EXAM: PORTABLE CHEST 1 VIEW COMPARISON:  Radiographs of May 27, 2016. FINDINGS: Stable cardiomediastinal silhouette. Atherosclerosis of thoracic aorta is noted. No pneumothorax or significant pleural effusion is noted. Right lung is clear. Mild left basilar subsegmental atelectasis or infiltrate is noted. Left shoulder arthroplasty. IMPRESSION: Aortic  atherosclerosis. Mild left basilar subsegmental atelectasis or infiltrate is noted. Electronically Signed   By: Marijo Conception, M.D.   On: 05/30/2016 19:45    Scheduled Meds: . calcium carbonate  2 tablet Oral BID  . cefTRIAXone (ROCEPHIN)  IV  2 g Intravenous Q24H  . colchicine  0.6 mg Oral Daily  . cyanocobalamin  1,000 mcg Intramuscular Q14 Days  . doxycycline (VIBRAMYCIN) IV  100 mg Intravenous Q12H  . gabapentin  100 mg Oral Daily  . gabapentin  200 mg Oral QHS  . ketotifen  1 drop Both Eyes BID  . magnesium oxide  400 mg Oral BID  . pantoprazole  40 mg Oral Daily  . PARoxetine  40 mg Oral QHS  . rivaroxaban  15 mg Oral Q supper  . saccharomyces boulardii  250 mg Oral BID  . sodium chloride flush  3 mL Intravenous Q12H  . traZODone  150 mg Oral QHS   Continuous Infusions: . sodium chloride 75 mL/hr at 05/30/16 1900  . sodium chloride Stopped (05/28/16 1903)  . amiodarone 30 mg/hr (05/31/16 1200)  . diltiazem (CARDIZEM) infusion Stopped (05/30/16 1420)     LOS: 5 days   Time spent: 20 minutes   Faye Ramsay, MD [facility] Pager [phone]  If 7PM-7AM, please contact night-coverage [email or web address] Password [email or web address] 05/31/2016, 2:17 PM

## 2016-05-31 NOTE — Evaluation (Signed)
Physical Therapy Evaluation Patient Details Name: Derek [name] MRN: [id number] DOB: 1940/11/16 Today's Date: 05/31/2016   History of Present Illness  HPI: Derek [name] is a 75 y.o. male with medical history significant of colon poylps, AAA, mild to mod AI followed by cardiology, Crohn's disease s/p ileocolonic in 1980, B12 deficiency, GERD, Gout, Hep C, PE, chronic thrombocytopenia,  PTSD, Lung nodule, HTN, RLS who was in Pre-op for segmental resection of the transverse colon today  when he was noted to be in A-fib with RVR.   S/P cardioversion 05/29/16.  Clinical Impression  The patient is very motivated. Plans to DC to home.Pt admitted with above diagnosis. Pt currently with functional limitations due to the deficits listed below (see PT Problem List).  Pt will benefit from skilled PT to increase their independence and safety with mobility to allow discharge to the venue listed below.       Follow Up Recommendations Supervision/Assistance - 24 hour;No PT follow up    Equipment Recommendations  None recommended by PT    Recommendations for Other Services       Precautions / Restrictions Precautions Precautions: Fall Precaution Comments: monitor VS, Restrictions Weight Bearing Restrictions: No      Mobility  Bed Mobility Overal bed mobility: Needs Assistance Bed Mobility: Supine to Sit     Supine to sit: Supervision        Transfers Overall transfer level: Needs assistance Equipment used: None Transfers: Sit to/from Stand Sit to Stand: Supervision            Ambulation/Gait Ambulation/Gait assistance: Min assist Ambulation Distance (Feet): 200 Feet Assistive device: 1 person hand held assist Gait Pattern/deviations: Step-through pattern;Staggering right;Staggering left     General Gait Details: patient stagering at times. HR remained in 60's, Sats >95%.   Stairs            Wheelchair Mobility    Modified Rankin (Stroke Patients Only)       Balance Overall balance assessment: Needs assistance         Standing balance support: During functional activity;No upper extremity supported Standing balance-Leahy Scale: Fair                               Pertinent Vitals/Pain Pain Assessment: Faces Faces Pain Scale: Hurts even more Pain Location: left lower back"I think I pulled a muscle yesterday" Pain Descriptors / Indicators: Contraction;Cramping Pain Intervention(s): Heat applied;Limited activity within patient's tolerance;Monitored during session    [place] expects to be discharged to:: Private residence Living Arrangements: Spouse/significant other Available Help at Discharge: Family Type of Home: House Home Access: Stairs to enter Entrance Stairs-Rails: Psychiatric nurse of Steps: 4 Home Layout: One level Home Equipment: None      Prior Function                 Hand Dominance        Extremity/Trunk Assessment   Upper Extremity Assessment: Overall WFL for tasks assessed           Lower Extremity Assessment: Generalized weakness      Cervical / Trunk Assessment: Normal  Communication      Cognition Arousal/Alertness: Awake/alert Behavior During Therapy: WFL for tasks assessed/performed Overall Cognitive Status: Within Functional Limits for tasks assessed                      General Comments  Exercises     Assessment/Plan    PT Assessment Patient needs continued PT services  PT Problem List Decreased strength;Decreased activity tolerance;Decreased balance;Decreased mobility;Cardiopulmonary status limiting activity;Decreased safety awareness          PT Treatment Interventions DME instruction;Stair training;Functional mobility training;Therapeutic activities;Therapeutic exercise;Patient/family education    PT Goals (Current goals can be found in the Care Plan section)  Acute Rehab PT Goals Patient Stated Goal:  to go home soon PT Goal Formulation: With patient/family Time For Goal Achievement: 06/14/16 Potential to Achieve Goals: Good    Frequency Min 3X/week   Barriers to discharge        Co-evaluation               End of Session   Activity Tolerance: Patient tolerated treatment well Patient left: in chair;with call bell/phone within reach;with family/visitor present Nurse Communication: Mobility status         Time: 1047-1101 PT Time Calculation (min) (ACUTE ONLY): 14 min   Charges:   PT Evaluation $PT Eval Moderate Complexity: 1 Procedure     PT G CodesClaretha Cooper 05/31/2016, 12:04 PM Tresa Endo PT [phone]

## 2016-05-31 NOTE — Progress Notes (Signed)
Patient Name: Derek [name] Date of Encounter: 05/31/2016  Hospital Problem List     Principal Problem:   Atrial fibrillation with RVR [facility]) Active Problems:   Gout   Ascending aortic aneurysm (HCC)   Crohn's ileocolitis (HCC)   CKD (chronic kidney disease) stage 3, GFR 30-59 ml/min   Thrombocytopenia (HCC)   Enteric hyperoxaluria (HCC)   Secondary hyperparathyroidism (HCC)   Lung nodule   Peripheral neuropathic pain   GERD (gastroesophageal reflux disease)   Essential hypertension   Hypotension   Hypomagnesemia   Chronic diarrhea   Infectious diarrhea    Patient Profile     Derek [name] is a 75 year old patient with chronic systolic congestive heart failure and EF 45-50% as well as ascending aortic aneurysm. He has a history of Crohn's disease and prior ileocectomy. Recently he was found to have a possible early colonic lesion and was scheduled for elective partial resection . On presentation in the short stay area he was noted to be in A. fib with RVR.   Subjective   Mild nausea today.  Threw up last night.   Denies chest pain or SOB.  Inpatient Medications    . calcium carbonate  2 tablet Oral BID  . colchicine  0.6 mg Oral Daily  . cyanocobalamin  1,000 mcg Intramuscular Q14 Days  . gabapentin  100 mg Oral Daily  . gabapentin  200 mg Oral QHS  . ketotifen  1 drop Both Eyes BID  . magnesium oxide  400 mg Oral BID  . pantoprazole  40 mg Oral Daily  . PARoxetine  40 mg Oral QHS  . rivaroxaban  15 mg Oral Q supper  . saccharomyces boulardii  250 mg Oral BID  . sodium chloride flush  3 mL Intravenous Q12H  . sulfamethoxazole-trimethoprim  1 tablet Oral Q12H  . traZODone  150 mg Oral QHS    Vital Signs    Vitals:   05/31/16 0500 05/31/16 0600 05/31/16 0700 05/31/16 0800  BP: (!) 97/59 100/60 (!) 90/59 115/62  Pulse: (!) 55 (!) 59 (!) 48   Resp: (!) 26 (!) 21 20 (!) 29  Temp:      TempSrc:      SpO2: (!) 88% 95% 91% 98%  Weight:      Height:         Intake/Output Summary (Last 24 hours) at 05/31/16 0839 Last data filed at 05/31/16 0602  Gross per 24 hour  Intake          2571.39 ml  Output                0 ml  Net          2571.39 ml   Filed Weights   05/29/16 2000 05/30/16 0500 05/31/16 0357  Weight: 201 lb 8 oz (91.4 kg) 200 lb 9.9 oz (91 kg) 201 lb 11.5 oz (91.5 kg)    Physical Exam    GEN: Well nourished, well developed, in no acute distress Neck: Supple, no JVD, carotid bruits, or masses. Cardiac: Irregular no rubs, or gallops. No clubbing, cyanosis, no edema.  Radials/DP/PT 2+ and equal bilaterally.  Respiratory:  Respirations  regular and unlabored, clear to auscultation bilaterally. GI: Distended, nontender, nondistended, BS + x 4. Neuro:  Strength and sensation are intact.   Labs    CBC  Recent Labs  05/30/16 0345 05/31/16 0519  WBC 7.7 5.8  HGB 14.4 13.1  HCT 42.9 39.9  MCV 88.8 89.1  PLT  96* 88*   Basic Metabolic Panel  Recent Labs  05/29/16 0421 05/30/16 0345 05/31/16 0519  NA  --  137 140  K  --  4.7 3.9  CL  --  115* 114*  CO2  --  15* 18*  GLUCOSE  --  109* 113*  BUN  --  11 20  CREATININE  --  1.68* 2.06*  CALCIUM  --  8.3* 8.2*  MG 1.5*  --  1.4*   Liver Function Tests No results for input(s): AST, ALT, ALKPHOS, BILITOT, PROT, ALBUMIN in the last 72 hours. No results for input(s): LIPASE, AMYLASE in the last 72 hours. Cardiac Enzymes  Recent Labs  05/30/16 0830 05/30/16 1333 05/30/16 1901  TROPONINI <0.03 <0.03 <0.03   BNP Invalid input(s): POCBNP D-Dimer No results for input(s): DDIMER in the last 72 hours. Hemoglobin A1C No results for input(s): HGBA1C in the last 72 hours. Fasting Lipid Panel No results for input(s): CHOL, HDL, LDLCALC, TRIG, CHOLHDL, LDLDIRECT in the last 72 hours. Thyroid Function Tests No results for input(s): TSH, T4TOTAL, T3FREE, THYROIDAB in the last 72 hours.  Invalid input(s): FREET3  Telemetry    Atrial fib with RVR  ECG     NA  Radiology    Dg Chest 2 View  Result Date: 05/27/2016 CLINICAL DATA:  Atrial fibrillation and hypotension EXAM: CHEST  2 VIEW COMPARISON:  05/11/2016 FINDINGS: Cardiac shadow is within normal limits. Mild aortic calcifications are again seen and stable. The lungs are clear bilaterally. No acute bony abnormality is noted. Left shoulder replacement is seen. IMPRESSION: No active cardiopulmonary disease. Electronically Signed   By: Inez Catalina M.D.   On: 05/27/2016 15:46   Dg Abd 1 View  Result Date: 05/13/2016 CLINICAL DATA:  Followup small bowel obstruction. EXAM: ABDOMEN - 1 VIEW COMPARISON:  05/11/2016 FINDINGS: Nasogastric tube tip again seen within the proximal stomach. Mild decrease in dilatation of small bowel loops is seen since previous study. Air-fluid levels cannot be assessed due to lack over recto view. Multiple surgical clips and staples seen within the right abdomen. IMPRESSION: Mild decrease in dilated small bowel loops since prior study. Electronically Signed   By: Earle Gell M.D.   On: 05/13/2016 07:53   Ct Abdomen Pelvis W Contrast  Result Date: 05/11/2016 CLINICAL DATA:  Unable to have bowel movements, with abdominal distention, nausea and vomiting. Personal history of bowel obstructions. Initial encounter. EXAM: CT ABDOMEN AND PELVIS WITH CONTRAST TECHNIQUE: Multidetector CT imaging of the abdomen and pelvis was performed using the standard protocol following bolus administration of intravenous contrast. CONTRAST:  107m ISOVUE-300 IOPAMIDOL (ISOVUE-300) INJECTION 61% COMPARISON:  CT of the abdomen and pelvis from 03/09/2012, and MRI of the brain performed 03/03/2013 FINDINGS: Lower chest: Mild left basilar atelectasis is noted. A 6 mm nodule is noted at the left lower lobe (image 3 of 67). Scattered coronary artery calcifications are seen. Hepatobiliary: A small calcified granuloma is noted at the hepatic dome. The liver is otherwise unremarkable. The patient is status post  cholecystectomy, with clips noted at the gallbladder fossa. The common bile duct remains normal in caliber. Pancreas: The pancreas is within normal limits. Spleen: The spleen is enlarged, measuring 16.0 cm in length. Adrenals/Urinary Tract: The adrenal glands are grossly unremarkable in appearance. There is moderate bilateral renal atrophy, with scattered bilateral renal cysts. Small nonobstructing bilateral renal stones are seen, measuring up to 3 mm in size. Mild nonspecific perinephric stranding is noted bilaterally. Stomach/Bowel: There is dilatation of  small-bowel loops up to 4.6 cm in maximal diameter, with gradual fecalization at the distal ileum, and decompression distal to focal clumping and mild ileal wall thickening at the right lower quadrant. This likely reflects at least partial small bowel obstruction, due to an underlying adhesion. Residual fluid and air is noted within the ascending colon. The patient's ileocolic anastomosis is grossly unremarkable in appearance. The more distal colon is decompressed and grossly unremarkable in appearance. The stomach is largely filled with air and fluid, with an enteric tube seen ending at the body of the stomach. Vascular/Lymphatic: Scattered calcification is seen along the abdominal aorta and its branches. The abdominal aorta is otherwise grossly unremarkable. The inferior vena cava is grossly unremarkable. No retroperitoneal lymphadenopathy is seen. No pelvic sidewall lymphadenopathy is identified. Reproductive: The bladder is decompressed and not well characterized. The prostate remains normal in size. Other: No additional soft tissue abnormalities are seen. Musculoskeletal: No acute osseous abnormalities are identified. The visualized musculature is unremarkable in appearance. IMPRESSION: 1. Dilatation of small-bowel loops to 4.6 cm in maximal diameter, with gradual fecalization at the distal ileum, and decompression distal to focal clumping and mild ileal  wall thickening at the right lower quadrant. This likely reflects at least partial small bowel obstruction, due to an underlying adhesion. 2. Ileocolic anastomosis is grossly unremarkable in appearance. 3. Stomach largely filled with air and fluid. 4. Scattered coronary artery calcifications seen. 5. Moderate bilateral renal atrophy, with scattered bilateral renal cysts. Nonobstructing bilateral renal stones measure up to 3 mm in size. 6. Scattered aortic atherosclerosis noted. 7. Mild left basilar atelectasis noted. 8. Splenomegaly noted. 9. **An incidental finding of potential clinical significance has been found. 6 mm nodule at the left lower lung lobe. Non-contrast chest CT at 6-12 months is recommended. If the nodule is stable at time of repeat CT, then future CT at 18-24 months (from today's scan) is considered optional for low-risk patients, but is recommended for high-risk patients. This recommendation follows the consensus statement: Guidelines for Management of Incidental Pulmonary Nodules Detected on CT Images: From the Fleischner Society 2017; Radiology 2017; 284:228-243.** Electronically Signed   By: Garald Balding M.D.   On: 05/11/2016 23:21   Dg Chest Port 1 View  Result Date: 05/30/2016 CLINICAL DATA:  Hypertension. EXAM: PORTABLE CHEST 1 VIEW COMPARISON:  Radiographs of May 27, 2016. FINDINGS: Stable cardiomediastinal silhouette. Atherosclerosis of thoracic aorta is noted. No pneumothorax or significant pleural effusion is noted. Right lung is clear. Mild left basilar subsegmental atelectasis or infiltrate is noted. Left shoulder arthroplasty. IMPRESSION: Aortic atherosclerosis. Mild left basilar subsegmental atelectasis or infiltrate is noted. Electronically Signed   By: Marijo Conception, M.D.   On: 05/30/2016 19:45   Dg Abd Acute W/chest  Result Date: 05/11/2016 CLINICAL DATA:  Abdominal distention, abdominal pain, nausea and vomiting. History of Crohn's disease and bowel obstruction.  EXAM: DG ABDOMEN ACUTE W/ 1V CHEST COMPARISON:  CT abdomen/ pelvis performed subsequently available at time of radiograph interpretation. FINDINGS: Scattered atelectasis at the lung bases. Heart is upper limits of normal in size. There is atherosclerosis of the thoracic aorta. No pleural fluid. Enteric tube in place, tip and side port below the diaphragm in the stomach. Stomach is distended with air-fluid level. Scattered air-fluid levels throughout small bowel. Enteric sutures in the right lower quadrant of the abdomen. No free air. Minimal stool burden throughout the colon. Cholecystectomy clips in the right upper quadrant. There are vascular calcifications. No acute osseous abnormalities are seen. IMPRESSION:  1. Distended stomach with air-fluid level, and air-fluid levels throughout small bowel. Findings suggest small bowel obstruction. 2. Enteric tube in the stomach. 3. Bibasilar atelectasis.  Thoracic aortic atherosclerosis. Electronically Signed   By: Jeb Levering M.D.   On: 05/11/2016 22:54    Assessment & Plan    ATRIAL FIB:  He has TEE/DCCV.  However, back in atrial fib with RVR.  Continue Xarelto.  I started amiodarone IV yesterday and he went back into atrial fib.  Continue IV today as he is still nauseated and threw up last night.  Switch to PO in the AM.   CHRONIC SYSTOLIC AND DIASTOLIC HF:   EF 11%.   Seems to be euvolemic.    CKD:  Creat is elevated today.Ronnell Guadalajara, MD  05/31/2016, 8:39 AM

## 2016-06-01 LAB — CBC
HCT: 35.5 % — ABNORMAL LOW (ref 39.0–52.0)
Hemoglobin: 11.7 g/dL — ABNORMAL LOW (ref 13.0–17.0)
MCH: 29.6 pg (ref 26.0–34.0)
MCHC: 33 g/dL (ref 30.0–36.0)
MCV: 89.9 fL (ref 78.0–100.0)
PLATELETS: 83 10*3/uL — AB (ref 150–400)
RBC: 3.95 MIL/uL — ABNORMAL LOW (ref 4.22–5.81)
RDW: 15.3 % (ref 11.5–15.5)
WBC: 4.2 10*3/uL (ref 4.0–10.5)

## 2016-06-01 LAB — BASIC METABOLIC PANEL
Anion gap: 4 — ABNORMAL LOW (ref 5–15)
BUN: 15 mg/dL (ref 6–20)
CALCIUM: 7.9 mg/dL — AB (ref 8.9–10.3)
CO2: 17 mmol/L — ABNORMAL LOW (ref 22–32)
CREATININE: 1.79 mg/dL — AB (ref 0.61–1.24)
Chloride: 115 mmol/L — ABNORMAL HIGH (ref 101–111)
GFR calc Af Amer: 42 mL/min — ABNORMAL LOW (ref >60)
GFR, EST NON AFRICAN AMERICAN: 36 mL/min — AB (ref >60)
Glucose, Bld: 107 mg/dL — ABNORMAL HIGH (ref 65–99)
Potassium: 3.9 mmol/L (ref 3.5–5.1)
SODIUM: 136 mmol/L (ref 135–145)

## 2016-06-01 LAB — PROCALCITONIN: Procalcitonin: 0.17 ng/mL

## 2016-06-01 LAB — MAGNESIUM: MAGNESIUM: 1.6 mg/dL — AB (ref 1.7–2.4)

## 2016-06-01 MED ORDER — AMIODARONE HCL 200 MG PO TABS
400.0000 mg | ORAL_TABLET | Freq: Two times a day (BID) | ORAL | Status: DC
  Administered 2016-06-01 – 2016-06-02 (×3): 400 mg via ORAL
  Filled 2016-06-01 (×3): qty 2

## 2016-06-01 NOTE — Progress Notes (Signed)
Patient ID: Derek [name], male   DOB: 1940/10/19, 75 y.o.   MRN: [id number]    PROGRESS NOTE    MACDONALD RIGOR  WUJ:[id number] DOB: 05-01-1941 DOA: 05/26/2016  PCP: Purvis Kilts, MD   Brief Narrative:  75 y.o. male with known colon poylps, AAA, mild to mod AI followed by cardiology, Crohn's disease, B12 deficiency, GERD, Gout, Hep C, PE, chronic thrombocytopenia,  PTSD, Lung nodule, HTN, RLS who was in Pre-op eval for segmental resection of the transverse colon on the day of the admission (due to high grade dysplasia on recent colonocopy) when he was noted to be in A-fib with RVR. He has not had any chest pain, palpations, dyspnea, light headed sensation or pedal edema. He said he was 'cleaned out' for the surgery and felt dehydrated. Gen surgery asked for [facility] to admit. Cardiology consulting.  Assessment & Plan:  SIRS, Diarrhea secondary to yersinia enterocolitis  - confirmed with stool panel  - continue with supportive care as recommended by GI team - pt reports feeling better, no nausea or vomiting this AM, diarrhea improving as well  - continue ABX to IV doxy and rocephin - continue florastor  SIRS secondary to ? LLL PNA - no fevers this AM but had T up to 101.2 F - currently on ABX doxycycline and rocephin and will continue same regimen  - no productive sputum to collect at this time but will monitor closely   Vomiting  - resolved  - tolerating diet well   Atrial fibrillation with RVR - unknown duration  - cardiology team following, appreciate assistance  - s/p cardioversion which was done 10/27 - in NSR this AM  Thrombocytopenia - chronic - no signs of active bleeding  - CBC in AM  Hypotension - due to dehydration in the setting of bowel prep and yersinia enterocolitis  - SBP in 110's this AM, continue with gentle hydration, NS at 75 cc/hr   Ascending aortic aneurysm  - followed closely by [facility].  CKD stage III (baseline 1.6-1.9)    - Cr trending down, stop IVF - BMP in AM  Hypomagnesemia  - supplemented, pending this AM   DVT prophylaxis: Lovenox SQ Code Status: Full  Family Communication: Patient and wife at bedside  Disposition Plan: transfer to tele   Consultants:   Cardiology   Procedures:   Cardioversion 10/27 -->  Antimicrobials:   Bactrim 10/27 --> 10//29  Doxy and Rocephin 10/29 -->  Subjective: Reports feeling better.   Objective: Vitals:   06/01/16 0000 06/01/16 0400 06/01/16 0600 06/01/16 0800  BP: 101/61 99/65  103/73  Pulse: (!) 58 (!) 59  (!) 59  Resp: 19 18  18   Temp:  97.9 F (36.6 C)  97.8 F (36.6 C)  TempSrc:  Oral  Oral  SpO2: 95% 95%  96%  Weight:   91.1 kg (200 lb 13.4 oz)   Height:        Intake/Output Summary (Last 24 hours) at 06/01/16 1108 Last data filed at 06/01/16 0400  Gross per 24 hour  Intake           2142.4 ml  Output                0 ml  Net           2142.4 ml   Filed Weights   05/30/16 0500 05/31/16 0357 06/01/16 0600  Weight: 91 kg (200 lb 9.9 oz) 91.5 kg (201 lb 11.5 oz) 91.1  kg (200 lb 13.4 oz)    Examination:  General exam: Appears calm and comfortable  Respiratory system: Clear to auscultation. Respiratory effort normal. Cardiovascular system: RRR. No JVD, murmurs, rubs, gallops or clicks. No pedal edema. Gastrointestinal system: Abdomen is nondistended, soft and nontender. No organomegaly or masses felt.  Central nervous system: Alert and oriented. No focal neurological deficits.  Data Reviewed: I have personally reviewed following labs and imaging studies  CBC:  Recent Labs Lab 05/28/16 0344 05/29/16 0350 05/30/16 0345 05/31/16 0519 06/01/16 0545  WBC 6.1 5.4 7.7 5.8 4.2  HGB 13.8 13.4 14.4 13.1 11.7*  HCT 41.3 39.9 42.9 39.9 35.5*  MCV 87.5 88.5 88.8 89.1 89.9  PLT 105* 91* 96* 88* 83*   Basic Metabolic Panel:  Recent Labs Lab 05/26/16 1342 05/27/16 0331 05/28/16 0344 05/29/16 0350 05/29/16 0421  05/30/16 0345 05/31/16 0519 06/01/16 0545  NA 140 140 140 141  --  137 140 136  K 4.6 3.9 3.9 3.9  --  4.7 3.9 3.9  CL 110 114* 114* 116*  --  115* 114* 115*  CO2 21* 19* 19* 17*  --  15* 18* 17*  GLUCOSE 97 120* 112* 115*  --  109* 113* 107*  BUN 21* 20 16 12   --  11 20 15   CREATININE 1.76* 1.88* 1.86* 1.75*  --  1.68* 2.06* 1.79*  CALCIUM 9.0 8.7* 8.6* 8.3*  --  8.3* 8.2* 7.9*  MG 1.4* 2.3 1.6*  --  1.5*  --  1.4*  --    Coagulation Profile:  Recent Labs Lab 05/26/16 1342 05/26/16 1430  INR 1.01 1.03   Urine analysis:    Component Value Date/Time   COLORURINE AMBER (A) 05/31/2016 0731   APPEARANCEUR CLEAR 05/31/2016 0731   LABSPEC 1.023 05/31/2016 0731   PHURINE 5.5 05/31/2016 0731   GLUCOSEU NEGATIVE 05/31/2016 0731   HGBUR NEGATIVE 05/31/2016 0731   BILIRUBINUR SMALL (A) 05/31/2016 0731   KETONESUR NEGATIVE 05/31/2016 0731   PROTEINUR NEGATIVE 05/31/2016 0731   NITRITE NEGATIVE 05/31/2016 0731   LEUKOCYTESUR NEGATIVE 05/31/2016 0731   Recent Results (from the past 240 hour(s))  Surgical pcr screen     Status: None   Collection Time: 05/21/16  8:44 AM  Result Value Ref Range Status   MRSA, PCR NEGATIVE NEGATIVE Final   Staphylococcus aureus NEGATIVE NEGATIVE Final    Comment:        The Xpert SA Assay (FDA approved for NASAL specimens in patients over 55 years of age), is one component of a comprehensive surveillance program.  Test performance has been validated by [facility] for patients greater than or equal to 2 year old. It is not intended to diagnose infection nor to guide or monitor treatment.   MRSA PCR Screening     Status: None   Collection Time: 05/26/16  5:01 PM  Result Value Ref Range Status   MRSA by PCR NEGATIVE NEGATIVE Final    Radiology Studies: Dg Chest Port 1 View  Result Date: 05/30/2016 CLINICAL DATA:  Hypertension. EXAM: PORTABLE CHEST 1 VIEW COMPARISON:  Radiographs of May 27, 2016. FINDINGS: Stable cardiomediastinal  silhouette. Atherosclerosis of thoracic aorta is noted. No pneumothorax or significant pleural effusion is noted. Right lung is clear. Mild left basilar subsegmental atelectasis or infiltrate is noted. Left shoulder arthroplasty. IMPRESSION: Aortic atherosclerosis. Mild left basilar subsegmental atelectasis or infiltrate is noted. Electronically Signed   By: Marijo Conception, M.D.   On: 05/30/2016 19:45    Scheduled  Meds: . calcium carbonate  2 tablet Oral BID  . cefTRIAXone (ROCEPHIN)  IV  2 g Intravenous Q24H  . colchicine  0.6 mg Oral Daily  . cyanocobalamin  1,000 mcg Intramuscular Q14 Days  . doxycycline (VIBRAMYCIN) IV  100 mg Intravenous Q12H  . gabapentin  100 mg Oral Daily  . gabapentin  200 mg Oral QHS  . ketotifen  1 drop Both Eyes BID  . magnesium oxide  400 mg Oral BID  . pantoprazole  40 mg Oral Daily  . PARoxetine  40 mg Oral QHS  . rivaroxaban  15 mg Oral Q supper  . saccharomyces boulardii  250 mg Oral BID  . sodium chloride flush  3 mL Intravenous Q12H  . traZODone  150 mg Oral QHS   Continuous Infusions: . sodium chloride 75 mL/hr at 05/31/16 2100  . sodium chloride Stopped (05/28/16 1903)  . amiodarone 30 mg/hr (05/31/16 2338)  . diltiazem (CARDIZEM) infusion Stopped (05/30/16 1420)     LOS: 6 days   Time spent: 20 minutes   Faye Ramsay, MD [facility] Pager [phone]  If 7PM-7AM, please contact night-coverage [email or web address] Password [email or web address] 06/01/2016, 11:08 AM

## 2016-06-01 NOTE — Evaluation (Signed)
Occupational Therapy Evaluation Patient Details Name: Derek [name] MRN: [id number] DOB: [birth year] Today's Date: 06/01/2016    History of Present Illness HPI: Derek [name] is a 75 y.o. male with medical history significant of colon poylps, AAA, mild to mod AI followed by cardiology, Crohn's disease s/p ileocolonic in 1980, B12 deficiency, GERD, Gout, Hep C, PE, chronic thrombocytopenia,  PTSD, Lung nodule, HTN, RLS who was in Pre-op for segmental resection of the transverse colon today  when he was noted to be in A-fib with RVR.   S/P cardioversion 05/29/16.   Clinical Impression   PT s/p cardioversion 05/29/16 and Afib with RVR. Pt currently with functional limitiations due to the deficits listed below (see OT problem list). PTA was independent with all adls.  Pt will benefit from skilled OT to increase their independence and safety with adls and balance to allow discharge home without followup.Pt was able to complete a full adl this session but c/o back pain after completion.     Follow Up Recommendations  No OT follow up    Equipment Recommendations  None recommended by OT    Recommendations for Other Services       Precautions / Restrictions Precautions Precautions: Fall Precaution Comments: monitor VS,      Mobility Bed Mobility Overal bed mobility: Independent                Transfers Overall transfer level: Modified independent                    Balance                                 Standardized Balance Assessment Standardized Balance Assessment : Dynamic Gait Index   Dynamic Gait Index Level Surface: Normal Change in Gait Speed: Normal Gait with Horizontal Head Turns: Mild Impairment Gait with Vertical Head Turns: Mild Impairment Gait and Pivot Turn: Normal Step Over Obstacle: Mild Impairment Step Around Obstacles: Normal      ADL Overall ADL's : Needs assistance/impaired Eating/Feeding: Independent    Grooming: Wash/dry hands;Wash/dry face;Oral care;Supervision/safety   Upper Body Bathing: Supervision/ safety   Lower Body Bathing: Supervison/ safety   Upper Body Dressing : Supervision/safety       Toilet Transfer: Supervision/safety   Toileting- Clothing Manipulation and Hygiene: Supervision/safety       Functional mobility during ADLs: Supervision/safety General ADL Comments: Pt completed balance assessment and indicated LOB with head turns by drifting the opposite direction      Vision     Perception     Praxis      Pertinent Vitals/Pain Pain Assessment: Faces Faces Pain Scale: Hurts even more Pain Location: back Pain Descriptors / Indicators: Grimacing Pain Intervention(s): Heat applied;Monitored during session;Repositioned;Limited activity within patient's tolerance     Hand Dominance Right   Extremity/Trunk Assessment Upper Extremity Assessment Upper Extremity Assessment: Overall WFL for tasks assessed   Lower Extremity Assessment Lower Extremity Assessment: Defer to PT evaluation   Cervical / Trunk Assessment Cervical / Trunk Assessment: Normal   Communication Communication Communication: No difficulties   Cognition Arousal/Alertness: Awake/alert Behavior During Therapy: WFL for tasks assessed/performed Overall Cognitive Status: Within Functional Limits for tasks assessed                     General Comments       Exercises       Shoulder Instructions  Home Living Family/patient expects to be discharged to:: Private residence Living Arrangements: Spouse/significant other Available Help at Discharge: Family Type of Home: House Home Access: Stairs to enter Technical brewer of Steps: 4 Entrance Stairs-Rails: [place]: One level     Bathroom Shower/Tub: Teacher, early years/pre: [place]: None          Prior Functioning/Environment Level of Independence: Independent         Comments: woodworking (making furniture, tables), fishing ([place]), reading        OT Problem List: Decreased strength;Decreased activity tolerance;Impaired balance (sitting and/or standing);Decreased safety awareness;Decreased knowledge of use of DME or AE;Decreased knowledge of precautions;Cardiopulmonary status limiting activity   OT Treatment/Interventions: Self-care/ADL training;Energy conservation;Therapeutic activities;Patient/family education;Balance training;DME and/or AE instruction;Therapeutic exercise    OT Goals(Current goals can be found in the care plan section) Acute Rehab OT Goals Patient Stated Goal: to go home soon OT Goal Formulation: With patient Time For Goal Achievement: 06/08/16 Potential to Achieve Goals: Good  OT Frequency: Min 2X/week   Barriers to D/C:            Co-evaluation              End of Session Equipment Utilized During Treatment: Gait belt Nurse Communication: Mobility status;Precautions  Activity Tolerance: Patient tolerated treatment well Patient left: in chair;with chair alarm set;with call bell/phone within reach;with family/visitor present   Time: 1117-3567 OT Time Calculation (min): 47 min Charges:  OT General Charges $OT Visit: 1 Procedure OT Evaluation $OT Eval Moderate Complexity: 1 Procedure OT Treatments $Self Care/Home Management : 23-37 mins G-Codes:    Parke Poisson B 2016-06-10, 9:31 AM   Jeri Modena   OTR/L Pager: [phone] Office: [phone] .

## 2016-06-01 NOTE — Progress Notes (Signed)
Physical Therapy Treatment Patient Details Name: Derek [name] MRN: [id number] DOB: [birth year] Today's Date: 06/01/2016    History of Present Illness HPI: Derek [name] is a 75 y.o. male with medical history significant of colon poylps, AAA, mild to mod AI followed by cardiology, Crohn's disease s/p ileocolonic in 1980, B12 deficiency, GERD, Gout, Hep C, PE, chronic thrombocytopenia,  PTSD, Lung nodule, HTN, RLS who was in Pre-op for segmental resection of the transverse colon today  when he was noted to be in A-fib with RVR.   S/P cardioversion 05/29/16.    PT Comments    Patient was seen in bed upon arrival with family present. Performed supine to sit and sit to stand requiring Supervision.  Gait x 400 ft x min guard. Patient complained of pain in L hip and was repositioned in bed with heating pad applied following treatment. Patient displayed a 4/10 on the facial pain scale. HR increased to 76 bpm and O2 decreased to 87% very briefly on RA during ambulation. VC's required to encourage pacing and pursed lip breathing to normalize O2 sats.   Follow Up Recommendations  Supervision/Assistance - 24 hour;No PT follow up     Equipment Recommendations  None recommended by PT    Recommendations for Other Services       Precautions / Restrictions Precautions Precautions: Fall Precaution Comments: monitor VS, Restrictions Weight Bearing Restrictions: No    Mobility  Bed Mobility Overal bed mobility: Needs Assistance Bed Mobility: Supine to Sit     Supine to sit: Supervision     General bed mobility comments: impulsive.  Transfers Overall transfer level: Needs assistance Equipment used: None Transfers: Sit to/from Stand Sit to Stand: Supervision            Ambulation/Gait Ambulation/Gait assistance: Min guard Ambulation Distance (Feet): 400 Feet Assistive device: 1 person hand held assist Gait Pattern/deviations: Step-through pattern;Staggering right Gait  velocity: WNL Gait velocity interpretation: at or above normal speed for age/gender. General Gait Details: HR increased to 76 bpm during ambulation. LOB x 2 toward R with minA to recover.    Stairs            Wheelchair Mobility    Modified Rankin (Stroke Patients Only)       Balance                                    Cognition Arousal/Alertness: Awake/alert Behavior During Therapy: WFL for tasks assessed/performed Overall Cognitive Status: Within Functional Limits for tasks assessed                      Exercises      General Comments        Pertinent Vitals/Pain Pain Assessment: Faces Faces Pain Scale: Hurts little more Pain Location: back Pain Descriptors / Indicators: Grimacing Pain Intervention(s): Heat applied;Monitored during session;Repositioned;Limited activity within patient's tolerance    Home Living Family/patient expects to be discharged to:: Private residence Living Arrangements: Spouse/significant other Available Help at Discharge: Family Type of Home: House Home Access: Stairs to enter Entrance Stairs-Rails: Right;Left Home Layout: One level Home Equipment: None      Prior Function Level of Independence: Independent      Comments: woodworking (making furniture, tables), fishing (froma boat), reading   PT Goals (current goals can now be found in the care plan section) Acute Rehab PT Goals Patient Stated Goal: to go home  soon Progress towards PT goals: Progressing toward goals    Frequency    Min 3X/week      PT Plan Current plan remains appropriate    Co-evaluation             End of Session Equipment Utilized During Treatment: Gait belt Activity Tolerance: Patient tolerated treatment well Patient left: in chair;with call bell/phone within reach;with family/visitor present     Time: 6168-3729 PT Time Calculation (min) (ACUTE ONLY): 27 min  Charges:  $Gait Training: 8-22 mins $Therapeutic  Activity: 8-22 mins                    G CodesHall [name], SPTA [facility] Acute Rehab [phone]  Present and agree with above  Rica Koyanagi  PTA [facility]  Acute  Rehab Pager      [phone]

## 2016-06-01 NOTE — Progress Notes (Signed)
Date:  June 01, 2016 Chart reviewed for concurrent status and case management needs. Will continue to follow the patient for status change: iv amiodarone for a.fib with rvr uncontrolled. Discharge Planning: following for needs Expected discharge date: [id number] Kirandeep Fariss, BSN, [place], [place]

## 2016-06-01 NOTE — Progress Notes (Signed)
Patient Name: Derek [name] Date of Encounter: 06/01/2016  Primary Cardiologist: Dr. Selmer Dominion Problem List     Principal Problem:   Atrial fibrillation with RVR [facility]) Active Problems:   Gout   Ascending aortic aneurysm (HCC)   Crohn's ileocolitis (HCC)   CKD (chronic kidney disease) stage 3, GFR 30-59 ml/min   Thrombocytopenia (HCC)   Enteric hyperoxaluria (HCC)   Secondary hyperparathyroidism (HCC)   Lung nodule   Peripheral neuropathic pain   GERD (gastroesophageal reflux disease)   Essential hypertension   Hypotension   Hypomagnesemia   Chronic diarrhea   Infectious diarrhea     Subjective   No more nausea. Feeling well. Anxious to go home. Will be transferred to floor today and likely home tomorrow if he stays in rhythm   Inpatient Medications    Scheduled Meds: . calcium carbonate  2 tablet Oral BID  . cefTRIAXone (ROCEPHIN)  IV  2 g Intravenous Q24H  . colchicine  0.6 mg Oral Daily  . cyanocobalamin  1,000 mcg Intramuscular Q14 Days  . doxycycline (VIBRAMYCIN) IV  100 mg Intravenous Q12H  . gabapentin  100 mg Oral Daily  . gabapentin  200 mg Oral QHS  . ketotifen  1 drop Both Eyes BID  . magnesium oxide  400 mg Oral BID  . pantoprazole  40 mg Oral Daily  . PARoxetine  40 mg Oral QHS  . rivaroxaban  15 mg Oral Q supper  . saccharomyces boulardii  250 mg Oral BID  . sodium chloride flush  3 mL Intravenous Q12H  . traZODone  150 mg Oral QHS   Continuous Infusions: . sodium chloride Stopped (05/28/16 1903)  . amiodarone 30 mg/hr (05/31/16 2338)  . diltiazem (CARDIZEM) infusion Stopped (05/30/16 1420)   PRN Meds: acetaminophen, HYDROcodone-acetaminophen, ondansetron (ZOFRAN) IV, promethazine, sodium chloride flush   Vital Signs    Vitals:   06/01/16 0000 06/01/16 0400 06/01/16 0600 06/01/16 0800  BP: 101/61 99/65  103/73  Pulse: (!) 58 (!) 59  (!) 59  Resp: 19 18  18   Temp:  97.9 F (36.6 C)  97.8 F (36.6 C)  TempSrc:  Oral   Oral  SpO2: 95% 95%  96%  Weight:   200 lb 13.4 oz (91.1 kg)   Height:        Intake/Output Summary (Last 24 hours) at 06/01/16 1139 Last data filed at 06/01/16 0400  Gross per 24 hour  Intake           2142.4 ml  Output                0 ml  Net           2142.4 ml   Filed Weights   05/30/16 0500 05/31/16 0357 06/01/16 0600  Weight: 200 lb 9.9 oz (91 kg) 201 lb 11.5 oz (91.5 kg) 200 lb 13.4 oz (91.1 kg)    Physical Exam   GEN: Well nourished, well developed, in no acute distress.  HEENT: Grossly normal.  Neck: Supple, no JVD, carotid bruits, or masses. Cardiac: RRR, no murmurs, rubs, or gallops. No clubbing, cyanosis, edema.  Radials/DP/PT 2+ and equal bilaterally.  Respiratory:  Respirations regular and unlabored, clear to auscultation bilaterally. GI: Soft, nontender, nondistended, BS + x 4. MS: no deformity or atrophy. Skin: warm and dry, no rash. Neuro:  Strength and sensation are intact. Psych: AAOx3.  Normal affect.  Labs    CBC  Recent Labs  05/31/16 0519 06/01/16  0545  WBC 5.8 4.2  HGB 13.1 11.7*  HCT 39.9 35.5*  MCV 89.1 89.9  PLT 88* 83*   Basic Metabolic Panel  Recent Labs  05/31/16 0519 06/01/16 0545  NA 140 136  K 3.9 3.9  CL 114* 115*  CO2 18* 17*  GLUCOSE 113* 107*  BUN 20 15  CREATININE 2.06* 1.79*  CALCIUM 8.2* 7.9*  MG 1.4* 1.6*   Liver Function Tests No results for input(s): AST, ALT, ALKPHOS, BILITOT, PROT, ALBUMIN in the last 72 hours. No results for input(s): LIPASE, AMYLASE in the last 72 hours. Cardiac Enzymes  Recent Labs  05/30/16 0830 05/30/16 1333 05/30/16 1901  TROPONINI <0.03 <0.03 <0.03    Telemetry    Sinus bradycardia all today and yesterday 10/28 short run of afib- Personally Reviewed  ECG    05/30/16: Atrial fibrillation with rapid ventricular response HR 140 - Personally Reviewed  Radiology    Dg Chest Port 1 View  Result Date: 05/30/2016 CLINICAL DATA:  Hypertension. EXAM: PORTABLE CHEST 1 VIEW  COMPARISON:  Radiographs of May 27, 2016. FINDINGS: Stable cardiomediastinal silhouette. Atherosclerosis of thoracic aorta is noted. No pneumothorax or significant pleural effusion is noted. Right lung is clear. Mild left basilar subsegmental atelectasis or infiltrate is noted. Left shoulder arthroplasty. IMPRESSION: Aortic atherosclerosis. Mild left basilar subsegmental atelectasis or infiltrate is noted. Electronically Signed   By: Marijo Conception, M.D.   On: 05/30/2016 19:45    Cardiac Studies   Echocardiogram: 05/27/2016 Study Conclusions - Left ventricle: The cavity size was normal. There was moderate concentric hypertrophy. Systolic function was mildly reduced. The estimated ejection fraction was in the range of 45% to 50%. Wall motion was normal; there were no regional wall motion abnormalities. - Aortic valve: There was mild regurgitation. - Aorta: Aortic root dimension: 51 mm (ED). Ascending aorta diameter: 47 mm (ED). - Aortic root: The aortic root was moderately dilated. - Ascending aorta: The ascending aorta was moderately dilated. - Mitral valve: Calcified annulus.  Patient Profile     13M w/ PMH of aortic root and ascending aortic aneurysm, mild-mod AI by echo 09/2015, sinus bradycardia (baseline HR 50's), Crohn's disease, HTN, CKD stage III, thrombocytopenia, reduced LVF with EF 45-50% by echo in 2015 (improved to 55-60% in 09/2015), remote history of PE, chronically asymmetric pupilswho presented on 05/26/2016 for planned segmental resection of transverse colon for dysplastic colon polyp, found to be in newly recognized AF RVR prompting cancellation of surgery.   Assessment & Plan    Atrial fibrillation, newly recognized, unknown duration: s/p TEE/DCCV on 05/29/16. He did go back into afib with RVR on 10/28 and was started on IV amiodarone. Now back in sinus brady. I will change IV amio to PO amio 459m BID. -- This patients CHA2DS2-VASc Score and unadjusted  Ischemic Stroke Rate (% per year) is equal to 3.2 % stroke rate/year from a score of 3 (CHF, HTN, Age). Continue Xarelto 148mfor anticoagulation. (creat clearance 47) -- Echo shows mildly reduced EF of 45-50% with no regional wall motion abnormalities. LA normal in size. Normal EF in 09/2015. Perhaps tachycardia induced. Denies any recent anginal symptoms. Consider ischemic evaluation as an outpatient.  -- TSH and Free T4 WNL. Mg at 1.6 this AM (being replaced).   Ascending aortic aneurysm: echo this admission shows aortic root dimension: 51 mm (ED). Ascending aorta diameter: 47 mm (ED). Followed closely by [facility] as an outpatient.   HTN, with hypotension: terazosin discontinued. Bp still soft.  CKD stage III (baseline 1.6-1.9):  remains near baseline.  Hypomagnesemia:  suspect r/t bowel prep. Currently on mag ox   Yersinia: stool test + for Yersinia. This likely explains his diarrhea, dehydration and hypotension, further exacerbated by recent bowel prep. Per IM, plan will be GI consult. Continue supportive care for now.   Signed, Angelena Form, PA-C  06/01/2016, 11:39 AM  As above, patient seen and examined. He denies chest pain or dyspnea. He remains in sinus rhythm. Continue amiodarone and xarelto. His anticoagulation cannot be discontinued for 4 weeks following his recent cardioversion. His surgery will therefore need to be delayed until then. Can consider nuclear study as an outpatient given mildly reduced LV function on echocardiogram.   Kirk Ruths, MD

## 2016-06-02 LAB — BASIC METABOLIC PANEL
Anion gap: 5 (ref 5–15)
BUN: 14 mg/dL (ref 6–20)
CHLORIDE: 118 mmol/L — AB (ref 101–111)
CO2: 19 mmol/L — AB (ref 22–32)
Calcium: 8.3 mg/dL — ABNORMAL LOW (ref 8.9–10.3)
Creatinine, Ser: 1.75 mg/dL — ABNORMAL HIGH (ref 0.61–1.24)
GFR calc non Af Amer: 37 mL/min — ABNORMAL LOW (ref >60)
GFR, EST AFRICAN AMERICAN: 43 mL/min — AB (ref >60)
Glucose, Bld: 103 mg/dL — ABNORMAL HIGH (ref 65–99)
POTASSIUM: 4.3 mmol/L (ref 3.5–5.1)
SODIUM: 142 mmol/L (ref 135–145)

## 2016-06-02 LAB — MAGNESIUM: MAGNESIUM: 1.4 mg/dL — AB (ref 1.7–2.4)

## 2016-06-02 MED ORDER — PROMETHAZINE HCL 12.5 MG PO TABS: 12.5000 mg | ORAL_TABLET | Freq: Four times a day (QID) | ORAL | 0 refills | Status: DC | PRN

## 2016-06-02 MED ORDER — HYDROCODONE-ACETAMINOPHEN 5-325 MG PO TABS: 1.0000 | ORAL_TABLET | ORAL | 0 refills | Status: DC | PRN

## 2016-06-02 MED ORDER — AMIODARONE HCL 400 MG PO TABS: 400.0000 mg | ORAL_TABLET | Freq: Two times a day (BID) | ORAL | 0 refills | Status: DC

## 2016-06-02 MED ORDER — SACCHAROMYCES BOULARDII 250 MG PO CAPS: 250.0000 mg | ORAL_CAPSULE | Freq: Two times a day (BID) | ORAL | 0 refills | Status: DC

## 2016-06-02 MED ORDER — RIVAROXABAN 15 MG PO TABS: 15.0000 mg | ORAL_TABLET | Freq: Every day | ORAL | 0 refills | Status: DC

## 2016-06-02 MED ORDER — MAGNESIUM SULFATE 2 GM/50ML IV SOLN
2.0000 g | Freq: Once | INTRAVENOUS | Status: AC
  Administered 2016-06-02: 2 g via INTRAVENOUS
  Filled 2016-06-02: qty 50

## 2016-06-02 MED ORDER — DOXYCYCLINE HYCLATE 100 MG PO TABS: 100.0000 mg | ORAL_TABLET | Freq: Two times a day (BID) | ORAL | 0 refills | Status: DC

## 2016-06-02 NOTE — Discharge Instructions (Addendum)
Atrial Fibrillation Atrial fibrillation is a type of heartbeat that is irregular or fast (rapid). If you have this condition, your heart keeps quivering in a weird (chaotic) way. This condition can make it so your heart cannot pump blood normally. Having this condition gives a person more risk for stroke, heart failure, and other heart problems. There are different types of atrial fibrillation. Talk with your doctor to learn about the type that you have. HOME CARE  Take over-the-counter and prescription medicines only as told by your doctor.  If your doctor prescribed a blood-thinning medicine, take it exactly as told. Taking too much of it can cause bleeding. If you do not take enough of it, you will not have the protection that you need against stroke and other problems.  Do not use any tobacco products. These include cigarettes, chewing tobacco, and e-cigarettes. If you need help quitting, ask your doctor.  If you have apnea (obstructive sleep apnea), manage it as told by your doctor.  Do not drink alcohol.  Do not drink beverages that have caffeine. These include coffee, soda, and tea.  Maintain a healthy weight. Do not use diet pills unless your doctor says they are safe for you. Diet pills may make heart problems worse.  Follow diet instructions as told by your doctor.  Exercise regularly as told by your doctor.  Keep all follow-up visits as told by your doctor. This is important. GET HELP IF:  You notice a change in the speed, rhythm, or strength of your heartbeat.  You are taking a blood-thinning medicine and you notice more bruising.  You get tired more easily when you move or exercise. GET HELP RIGHT AWAY IF:  You have pain in your chest or your belly (abdomen).  You have sweating or weakness.  You feel sick to your stomach (nauseous).  You notice blood in your throw up (vomit), poop (stool), or pee (urine).  You are short of breath.  You suddenly have swollen feet  and ankles.  You feel dizzy.  Your suddenly get weak or numb in your face, arms, or legs, especially if it happens on one side of your body.  You have trouble talking, trouble understanding, or both.  Your face or your eyelid droops on one side. These symptoms may be an emergency. Do not wait to see if the symptoms will go away. Get medical help right away. Call your local emergency services (911 in the [place] Do not drive yourself to the hospital.   This information is not intended to replace advice given to you by your health care provider. Make sure you discuss any questions you have with your health care provider.   Conditions treated while hospitalized: Diarrhea secondary to yersinia enterocolitis  - confirmed with stool panel  - Gastroenterologist recommended completing therapy with doxycycline and probiotic florastor - discussed importance of adequate hydration   Early developing left lower lobe pneumonia - Doxycycline provided and will be adequate for treatment   Atrial fibrillation with rapid heart rate  - unknown duration  - status post cardioversion which was done 10/27 - currently on Amiodarone 400 mg twice daily - also on Xarelto 15 mg daily   Thrombocytopenia (low platelets) - chronic but stable overall during hospital stay   Hypotension - due to dehydration in the setting of bowel prep and yersinia enterocolitis   Ascending aortic aneurysm  - chronic but stable while hospitalized   Chronic kidney disease stage III (baseline creatinine 1.6-1.9) - creatinine has  remained at baseline, 1.7 today 10/31  Hypomagnesemia (low magnesium) - supplemented and now within normal limits

## 2016-06-02 NOTE — Progress Notes (Signed)
Date: June 02, 2016 Discharge orders checked for needs. No case management needs present at time of discharge. Velva Harman, RN, BSN, [place]   [phone]

## 2016-06-02 NOTE — Discharge Summary (Signed)
Physician Discharge Summary  Derek [name] TDH:[id number] DOB: 03/22/1941 DOA: 05/26/2016  PCP: Purvis Kilts, MD  Admit date: 05/26/2016 Discharge date: 06/02/2016  Recommendations for Outpatient Follow-up:  1. Pt will need to follow up with PCP in 1-2 weeks post discharge 2. Please obtain BMP to evaluate electrolytes and kidney function, Mg level  3. Pt advised to complete therapy with doxycycline 4. Please also note the addition of Xarelto and Amiodarone to pt's medical regimen  5. Continue amiodarone 400 BID for 2 weeks post discharge and then 200 mg daily thereafter. 6. Pt's anticoagulation cannot be discontinued for 4 weeks following his recent cardioversion. His surgery will therefore need to be delayed until then.    Discharge Diagnoses:  Principal Problem:   Atrial fibrillation with RVR ([place]) Active Problems:   Gout  Discharge Condition: Stable  Diet recommendation: Heart healthy diet discussed in details   Brief Narrative:  75 y.o.malewith known colon poylps, AAA, mild to mod AI followed by cardiology, Crohn's disease, B12 deficiency, GERD, Gout, Hep C, PE, chronic thrombocytopenia, PTSD, Lung nodule, HTN, RLS who was in Pre-op eval for segmental resection of the transverse colon on the day of the admission (due to high grade dysplasia on recent colonocopy) when he was noted to be in A-fib with RVR. He has not had any chest pain, palpations, dyspnea, light headed sensation or pedal edema. He said he was 'cleaned out' for the surgery and felt dehydrated. Gen surgery asked for [facility] to admit. Cardiology consulting.  Assessment & Plan:  SIRS, Diarrhea secondary to yersinia enterocolitis  - confirmed with stool panel  - pt reports feeling better, no nausea or vomiting this AM, diarrhea improving as well  - continue ABX doxy - continue florastor  SIRS secondary to LLL PNA, unknown pathogen  - no fevers this AM but had T up to 101.2 F -  currently on ABX doxycycline and rocephin and will continue same regimen  - no productive sputum to collect at this time - complete therapy with doxycycline as outlined below   Vomiting  - resolved  - tolerating diet well   Atrial fibrillation with RVR - unknown duration  - cardiology team following, appreciate assistance  - s/p cardioversion which was done 10/27 - in NSR this AM, continue amiodarone and Xarelto  - Echo shows mildly reduced EF of 45-50% with no regional wall motion abnormalities. LA normal in size. Normal EF in 09/2015 - likely tachycardia induced  Thrombocytopenia - chronic - no signs of active bleeding   Hypotension - due to dehydration in the setting of bowel prep and yersinia enterocolitis  - resolved with IVF  Ascending aortic aneurysm  - followed closely by [facility].  CKD stage III (baseline 1.6-1.9) - Cr trending down and at baseline   Hypomagnesemia  - supplemented prior to discharge via IV route and also advised to continue supplementing as per previous home regimen   Code Status: Full  Family Communication: Patient and wife at bedside  Disposition Plan: home   Consultants:   Cardiology   Procedures:   Cardioversion 10/27 -->  Antimicrobials:   Bactrim 10/27 --> 10//29  Doxy and Rocephin 10/29 --> 10/30  Doxy 10/30 --> complete therapy upon discharge    Procedures/Studies: Dg Chest 2 View  Result Date: 05/27/2016 CLINICAL DATA:  Atrial fibrillation and hypotension EXAM: CHEST  2 VIEW COMPARISON:  05/11/2016 FINDINGS: Cardiac shadow is within normal limits. Mild aortic calcifications are again seen and stable. The lungs  are clear bilaterally. No acute bony abnormality is noted. Left shoulder replacement is seen. IMPRESSION: No active cardiopulmonary disease. Electronically Signed   By: Inez Catalina M.D.   On: 05/27/2016 15:46   Dg Abd 1 View  Result Date: 05/13/2016 CLINICAL DATA:  Followup small bowel obstruction. EXAM:  ABDOMEN - 1 VIEW COMPARISON:  05/11/2016 FINDINGS: Nasogastric tube tip again seen within the proximal stomach. Mild decrease in dilatation of small bowel loops is seen since previous study. Air-fluid levels cannot be assessed due to lack over recto view. Multiple surgical clips and staples seen within the right abdomen. IMPRESSION: Mild decrease in dilated small bowel loops since prior study. Electronically Signed   By: Earle Gell M.D.   On: 05/13/2016 07:53   Ct Abdomen Pelvis W Contrast  Result Date: 05/11/2016 CLINICAL DATA:  Unable to have bowel movements, with abdominal distention, nausea and vomiting. Personal history of bowel obstructions. Initial encounter. EXAM: CT ABDOMEN AND PELVIS WITH CONTRAST TECHNIQUE: Multidetector CT imaging of the abdomen and pelvis was performed using the standard protocol following bolus administration of intravenous contrast. CONTRAST:  52m ISOVUE-300 IOPAMIDOL (ISOVUE-300) INJECTION 61% COMPARISON:  CT of the abdomen and pelvis from 03/09/2012, and MRI of the brain performed 03/03/2013 FINDINGS: Lower chest: Mild left basilar atelectasis is noted. A 6 mm nodule is noted at the left lower lobe (image 3 of 67). Scattered coronary artery calcifications are seen. Hepatobiliary: A small calcified granuloma is noted at the hepatic dome. The liver is otherwise unremarkable. The patient is status post cholecystectomy, with clips noted at the gallbladder fossa. The common bile duct remains normal in caliber. Pancreas: The pancreas is within normal limits. Spleen: The spleen is enlarged, measuring 16.0 cm in length. Adrenals/Urinary Tract: The adrenal glands are grossly unremarkable in appearance. There is moderate bilateral renal atrophy, with scattered bilateral renal cysts. Small nonobstructing bilateral renal stones are seen, measuring up to 3 mm in size. Mild nonspecific perinephric stranding is noted bilaterally. Stomach/Bowel: There is dilatation of small-bowel loops up to  4.6 cm in maximal diameter, with gradual fecalization at the distal ileum, and decompression distal to focal clumping and mild ileal wall thickening at the right lower quadrant. This likely reflects at least partial small bowel obstruction, due to an underlying adhesion. Residual fluid and air is noted within the ascending colon. The patient's ileocolic anastomosis is grossly unremarkable in appearance. The more distal colon is decompressed and grossly unremarkable in appearance. The stomach is largely filled with air and fluid, with an enteric tube seen ending at the body of the stomach. Vascular/Lymphatic: Scattered calcification is seen along the abdominal aorta and its branches. The abdominal aorta is otherwise grossly unremarkable. The inferior vena cava is grossly unremarkable. No retroperitoneal lymphadenopathy is seen. No pelvic sidewall lymphadenopathy is identified. Reproductive: The bladder is decompressed and not well characterized. The prostate remains normal in size. Other: No additional soft tissue abnormalities are seen. Musculoskeletal: No acute osseous abnormalities are identified. The visualized musculature is unremarkable in appearance. IMPRESSION: 1. Dilatation of small-bowel loops to 4.6 cm in maximal diameter, with gradual fecalization at the distal ileum, and decompression distal to focal clumping and mild ileal wall thickening at the right lower quadrant. This likely reflects at least partial small bowel obstruction, due to an underlying adhesion. 2. Ileocolic anastomosis is grossly unremarkable in appearance. 3. Stomach largely filled with air and fluid. 4. Scattered coronary artery calcifications seen. 5. Moderate bilateral renal atrophy, with scattered bilateral renal cysts. Nonobstructing bilateral renal  stones measure up to 3 mm in size. 6. Scattered aortic atherosclerosis noted. 7. Mild left basilar atelectasis noted. 8. Splenomegaly noted. 9. **An incidental finding of potential  clinical significance has been found. 6 mm nodule at the left lower lung lobe. Non-contrast chest CT at 6-12 months is recommended. If the nodule is stable at time of repeat CT, then future CT at 18-24 months (from today's scan) is considered optional for low-risk patients, but is recommended for high-risk patients. This recommendation follows the consensus statement: Guidelines for Management of Incidental Pulmonary Nodules Detected on CT Images: From the Fleischner Society 2017; Radiology 2017; 284:228-243.** Electronically Signed   By: Garald Balding M.D.   On: 05/11/2016 23:21   Dg Chest Port 1 View  Result Date: 05/30/2016 CLINICAL DATA:  Hypertension. EXAM: PORTABLE CHEST 1 VIEW COMPARISON:  Radiographs of May 27, 2016. FINDINGS: Stable cardiomediastinal silhouette. Atherosclerosis of thoracic aorta is noted. No pneumothorax or significant pleural effusion is noted. Right lung is clear. Mild left basilar subsegmental atelectasis or infiltrate is noted. Left shoulder arthroplasty. IMPRESSION: Aortic atherosclerosis. Mild left basilar subsegmental atelectasis or infiltrate is noted. Electronically Signed   By: Marijo Conception, M.D.   On: 05/30/2016 19:45   Dg Abd Acute W/chest  Result Date: 05/11/2016 CLINICAL DATA:  Abdominal distention, abdominal pain, nausea and vomiting. History of Crohn's disease and bowel obstruction. EXAM: DG ABDOMEN ACUTE W/ 1V CHEST COMPARISON:  CT abdomen/ pelvis performed subsequently available at time of radiograph interpretation. FINDINGS: Scattered atelectasis at the lung bases. Heart is upper limits of normal in size. There is atherosclerosis of the thoracic aorta. No pleural fluid. Enteric tube in place, tip and side port below the diaphragm in the stomach. Stomach is distended with air-fluid level. Scattered air-fluid levels throughout small bowel. Enteric sutures in the right lower quadrant of the abdomen. No free air. Minimal stool burden throughout the colon.  Cholecystectomy clips in the right upper quadrant. There are vascular calcifications. No acute osseous abnormalities are seen. IMPRESSION: 1. Distended stomach with air-fluid level, and air-fluid levels throughout small bowel. Findings suggest small bowel obstruction. 2. Enteric tube in the stomach. 3. Bibasilar atelectasis.  Thoracic aortic atherosclerosis. Electronically Signed   By: Jeb Levering M.D.   On: 05/11/2016 22:54     Discharge Exam: Vitals:   06/02/16 0700 06/02/16 0800  BP: 113/68 123/69  Pulse: (!) 58 60  Resp: 17 (!) 24  Temp:  98.9 F (37.2 C)   Vitals:   06/02/16 0500 06/02/16 0600 06/02/16 0700 06/02/16 0800  BP: 103/65 117/70 113/68 123/69  Pulse: (!) 59 (!) 59 (!) 58 60  Resp: 18 18 17  (!) 24  Temp:    98.9 F (37.2 C)  TempSrc:    Oral  SpO2: 95% 95% 96% 97%  Weight:      Height:        General: Pt is alert, follows commands appropriately, not in acute distress Cardiovascular: Regular rate and rhythm, S1/S2 +, no murmurs, no rubs, no gallops Respiratory: Clear to auscultation bilaterally, no wheezing, no crackles, no rhonchi Abdominal: Soft, non tender, non distended, bowel sounds +, no guarding Extremities: no edema, no cyanosis, pulses palpable bilaterally DP and PT Neuro: Grossly nonfocal  Discharge Instructions  Discharge Instructions    Diet - low sodium heart healthy    Complete by:  As directed    Increase activity slowly    Complete by:  As directed        Medication List  STOP taking these medications   atenolol 25 MG tablet Commonly known as:  TENORMIN     TAKE these medications   amiodarone 400 MG tablet Commonly known as:  PACERONE Take 1 tablet (400 mg total) by mouth 2 (two) times daily.   calcium carbonate 500 MG chewable tablet Commonly known as:  TUMS - dosed in mg elemental calcium Chew 2 tablets by mouth 2 (two) times daily.   colchicine 0.6 MG tablet Take 0.6 mg by mouth daily.   cyanocobalamin 1000 MCG/ML  injection Commonly known as:  (VITAMIN B-12) Inject 1,000 mcg into the muscle every 14 (fourteen) days.   cycloSPORINE 0.05 % ophthalmic emulsion Commonly known as:  RESTASIS Place 1 drop into both eyes 4 (four) times daily.   doxycycline 100 MG tablet Commonly known as:  VIBRA-TABS Take 1 tablet (100 mg total) by mouth 2 (two) times daily.   febuxostat 40 MG tablet Commonly known as:  ULORIC Take 80 mg by mouth daily.   gabapentin 100 MG capsule Commonly known as:  NEURONTIN Take 100-200 mg by mouth 2 (two) times daily. Pt takes one tablet in the morning and two at night.   HYDROcodone-acetaminophen 5-325 MG tablet Commonly known as:  NORCO/VICODIN Take 1 tablet by mouth every 4 (four) hours as needed for moderate pain.   ketotifen 0.025 % ophthalmic solution Commonly known as:  ZADITOR Place 1 drop into both eyes 2 (two) times daily.   magnesium oxide 400 (241.3 Mg) MG tablet Commonly known as:  MAG-OX Take 400 mg by mouth 2 (two) times daily.   minocycline 100 MG capsule Commonly known as:  MINOCIN,DYNACIN Take 100 mg by mouth daily as needed (when breakout occurs on face).   omeprazole 20 MG capsule Commonly known as:  PRILOSEC Take 20 mg by mouth daily.   PARoxetine 40 MG tablet Commonly known as:  PAXIL Take 40 mg by mouth at bedtime.   potassium chloride SA 20 MEQ tablet Commonly known as:  K-DUR,KLOR-CON Take 20 mEq by mouth 2 (two) times daily.   Rivaroxaban 15 MG Tabs tablet Commonly known as:  XARELTO Take 1 tablet (15 mg total) by mouth daily with supper.   saccharomyces boulardii 250 MG capsule Commonly known as:  FLORASTOR Take 1 capsule (250 mg total) by mouth 2 (two) times daily.   terazosin 2 MG capsule Commonly known as:  HYTRIN Take 2 mg by mouth at bedtime.   traZODone 150 MG tablet Commonly known as:  DESYREL Take 150 mg by mouth at bedtime.      Follow-up Information    Purvis Kilts, MD .   Specialty:  Family  Medicine Contact information: [address] [place] [address] [phone]        Faye Ramsay, MD. Call today.   Specialty:  Internal Medicine Why:  call my cell phone [phone] Contact information: [address] [place] [address] [phone]            The results of significant diagnostics from this hospitalization (including imaging, microbiology, ancillary and laboratory) are listed below for reference.     Microbiology: Recent Results (from the past 240 hour(s))  MRSA PCR Screening     Status: None   Collection Time: 05/26/16  5:01 PM  Result Value Ref Range Status   MRSA by PCR NEGATIVE NEGATIVE Final    Comment:        The GeneXpert MRSA Assay (FDA approved for NASAL specimens only), is one  component of a comprehensive MRSA colonization surveillance program. It is not intended to diagnose MRSA infection nor to guide or monitor treatment for MRSA infections.   Gastrointestinal Panel by PCR , Stool     Status: Abnormal   Collection Time: 05/28/16  2:51 PM  Result Value Ref Range Status   Campylobacter species NOT DETECTED NOT DETECTED Final   Plesimonas shigelloides NOT DETECTED NOT DETECTED Final   Salmonella species NOT DETECTED NOT DETECTED Final   Yersinia enterocolitica DETECTED (A) NOT DETECTED Final    Comment: RESULT CALLED TO, READ BACK BY AND VERIFIED WITH: AMANDA SHUE ON 05/29/16 AT 0550 QSD RESULT CALLED TO, READ BACK BY AND VERIFIED WITH: SCOTT RN AT 0555 ON 10.27.17 BY SHUEA    Vibrio species NOT DETECTED NOT DETECTED Final   Vibrio cholerae NOT DETECTED NOT DETECTED Final   Enteroaggregative E coli (EAEC) NOT DETECTED NOT DETECTED Final   Enteropathogenic E coli (EPEC) NOT DETECTED NOT DETECTED Final   Enterotoxigenic E coli (ETEC) NOT DETECTED NOT DETECTED Final   Shiga like toxin producing E coli (STEC) NOT DETECTED NOT DETECTED Final   Shigella/Enteroinvasive E coli (EIEC) NOT DETECTED NOT  DETECTED Final   Cryptosporidium NOT DETECTED NOT DETECTED Final   Cyclospora cayetanensis NOT DETECTED NOT DETECTED Final   Entamoeba histolytica NOT DETECTED NOT DETECTED Final   Giardia lamblia NOT DETECTED NOT DETECTED Final   Adenovirus F40/41 NOT DETECTED NOT DETECTED Final   Astrovirus NOT DETECTED NOT DETECTED Final   Norovirus GI/GII NOT DETECTED NOT DETECTED Final   Rotavirus A NOT DETECTED NOT DETECTED Final   Sapovirus (I, II, IV, and V) NOT DETECTED NOT DETECTED Final  C difficile quick scan w PCR reflex     Status: None   Collection Time: 05/28/16  2:51 PM  Result Value Ref Range Status   C Diff antigen NEGATIVE NEGATIVE Final   C Diff toxin NEGATIVE NEGATIVE Final   C Diff interpretation No C. difficile detected.  Final     Labs: Basic Metabolic Panel:  Recent Labs Lab 05/28/16 0344 05/29/16 0350 05/29/16 0421 05/30/16 0345 05/31/16 0519 06/01/16 0545 06/02/16 0319  NA 140 141  --  137 140 136 142  K 3.9 3.9  --  4.7 3.9 3.9 4.3  CL 114* 116*  --  115* 114* 115* 118*  CO2 19* 17*  --  15* 18* 17* 19*  GLUCOSE 112* 115*  --  109* 113* 107* 103*  BUN 16 12  --  11 20 15 14   CREATININE 1.86* 1.75*  --  1.68* 2.06* 1.79* 1.75*  CALCIUM 8.6* 8.3*  --  8.3* 8.2* 7.9* 8.3*  MG 1.6*  --  1.5*  --  1.4* 1.6* 1.4*   CBC:  Recent Labs Lab 05/28/16 0344 05/29/16 0350 05/30/16 0345 05/31/16 0519 06/01/16 0545  WBC 6.1 5.4 7.7 5.8 4.2  HGB 13.8 13.4 14.4 13.1 11.7*  HCT 41.3 39.9 42.9 39.9 35.5*  MCV 87.5 88.5 88.8 89.1 89.9  PLT 105* 91* 96* 88* 83*   Cardiac Enzymes:  Recent Labs Lab 05/30/16 0830 05/30/16 1333 05/30/16 1901  TROPONINI <0.03 <0.03 <0.03   SIGNED: Time coordinating discharge: 30 minutes  Faye Ramsay, MD  [facility] 06/02/2016, 10:54 AM Pager [phone]  If 7PM-7AM, please contact night-coverage [email or web address] Password [email or web address]

## 2016-06-02 NOTE — Progress Notes (Signed)
Patient Name: Derek [name] Date of Encounter: 06/02/2016  Primary Cardiologist: Dr. Selmer Dominion Problem List     Principal Problem:   Atrial fibrillation with RVR [facility]) Active Problems:   Gout   Ascending aortic aneurysm (HCC)   Crohn's ileocolitis (HCC)   CKD (chronic kidney disease) stage 3, GFR 30-59 ml/min   Thrombocytopenia (HCC)   Enteric hyperoxaluria (HCC)   Secondary hyperparathyroidism (HCC)   Lung nodule   Peripheral neuropathic pain   GERD (gastroesophageal reflux disease)   Essential hypertension   Hypotension   Hypomagnesemia   Chronic diarrhea   Infectious diarrhea     Subjective   No chest pain or dyspnea.  Inpatient Medications    Scheduled Meds: . amiodarone  400 mg Oral BID  . calcium carbonate  2 tablet Oral BID  . cefTRIAXone (ROCEPHIN)  IV  2 g Intravenous Q24H  . colchicine  0.6 mg Oral Daily  . cyanocobalamin  1,000 mcg Intramuscular Q14 Days  . doxycycline (VIBRAMYCIN) IV  100 mg Intravenous Q12H  . gabapentin  100 mg Oral Daily  . gabapentin  200 mg Oral QHS  . ketotifen  1 drop Both Eyes BID  . magnesium oxide  400 mg Oral BID  . magnesium sulfate 1 - 4 g bolus IVPB  2 g Intravenous Once  . pantoprazole  40 mg Oral Daily  . PARoxetine  40 mg Oral QHS  . rivaroxaban  15 mg Oral Q supper  . saccharomyces boulardii  250 mg Oral BID  . sodium chloride flush  3 mL Intravenous Q12H  . traZODone  150 mg Oral QHS   Continuous Infusions: . sodium chloride Stopped (05/28/16 1903)  . diltiazem (CARDIZEM) infusion Stopped (05/30/16 1420)   PRN Meds: acetaminophen, HYDROcodone-acetaminophen, ondansetron (ZOFRAN) IV, promethazine, sodium chloride flush   Vital Signs    Vitals:   06/02/16 0500 06/02/16 0600 06/02/16 0700 06/02/16 0800  BP: 103/65 117/70 113/68 123/69  Pulse: (!) 59 (!) 59 (!) 58 60  Resp: 18 18 17  (!) 24  Temp:    98.9 F (37.2 C)  TempSrc:    Oral  SpO2: 95% 95% 96% 97%  Weight:      Height:         Intake/Output Summary (Last 24 hours) at 06/02/16 1142 Last data filed at 06/01/16 1506  Gross per 24 hour  Intake            433.6 ml  Output                0 ml  Net            433.6 ml   Filed Weights   05/30/16 0500 05/31/16 0357 06/01/16 0600  Weight: 200 lb 9.9 oz (91 kg) 201 lb 11.5 oz (91.5 kg) 200 lb 13.4 oz (91.1 kg)    Physical Exam   GEN: Well nourished, well developed, in no acute distress.  HEENT: Grossly normal.  Neck: Supple Cardiac: RRR  Respiratory:  CTA GI: Soft, nontender, nondistended MS: no deformity or atrophy. Skin: warm and dry, no rash. Neuro:  Strength and sensation are intact. Psych: AAOx3.  Normal affect.  Labs    CBC  Recent Labs  05/31/16 0519 06/01/16 0545  WBC 5.8 4.2  HGB 13.1 11.7*  HCT 39.9 35.5*  MCV 89.1 89.9  PLT 88* 83*   Basic Metabolic Panel  Recent Labs  06/01/16 0545 06/02/16 0319  NA 136 142  K 3.9  4.3  CL 115* 118*  CO2 17* 19*  GLUCOSE 107* 103*  BUN 15 14  CREATININE 1.79* 1.75*  CALCIUM 7.9* 8.3*  MG 1.6* 1.4*   Cardiac Enzymes  Recent Labs  05/30/16 1333 05/30/16 1901  TROPONINI <0.03 <0.03     Cardiac Studies   Echocardiogram: 05/27/2016 Study Conclusions - Left ventricle: The cavity size was normal. There was moderate concentric hypertrophy. Systolic function was mildly reduced. The estimated ejection fraction was in the range of 45% to 50%. Wall motion was normal; there were no regional wall motion abnormalities. - Aortic valve: There was mild regurgitation. - Aorta: Aortic root dimension: 51 mm (ED). Ascending aorta diameter: 47 mm (ED). - Aortic root: The aortic root was moderately dilated. - Ascending aorta: The ascending aorta was moderately dilated. - Mitral valve: Calcified annulus.  Patient Profile     42M w/ PMH of aortic root and ascending aortic aneurysm, mild-mod AI by echo 09/2015, sinus bradycardia (baseline HR 50's), Crohn's disease, HTN, CKD stage III,  thrombocytopenia, reduced LVF with EF 45-50% by echo in 2015 (improved to 55-60% in 09/2015), remote history of PE, chronically asymmetric pupilswho presented on 05/26/2016 for planned segmental resection of transverse colon for dysplastic colon polyp, found to be in newly recognized AF RVR prompting cancellation of surgery.   Assessment & Plan    Atrial fibrillation, newly recognized, unknown duration: s/p TEE/DCCV on 05/29/16. He did go back into afib with RVR on 10/28 and was started on amiodarone. Remains in sinus; continue amiodarone 400 BID for 2 weeks and then 200 mg daily thereafter. -- This patients CHA2DS2-VASc Score and unadjusted Ischemic Stroke Rate (% per year) is equal to 3.2 % stroke rate/year from a score of 3 (CHF, HTN, Age). Continue Xarelto 15 mg for anticoagulation. His anticoagulation cannot be discontinued for 4 weeks following his recent cardioversion. His surgery will therefore need to be delayed until then.  -- Echo shows mildly reduced EF of 45-50% with no regional wall motion abnormalities. LA normal in size. Normal EF in 09/2015. Perhaps tachycardia induced. Denies any recent anginal symptoms. Consider ischemic evaluation as an outpatient.  -- TSH and Free T4 WNL.    Ascending aortic aneurysm: echo this admission shows aortic root dimension: 51 mm (ED). Ascending aorta diameter: 47 mm (ED). Followed closely by [facility] as an outpatient.   HTN terazosin discontinued due to decreased BP.    CKD stage III (baseline 1.6-1.9):  remains near baseline.  Yersinia: per IM  Pt should FU with Dr Aundra Dubin or extender 2 weeks following DC  Signed, Kirk Ruths, MD  06/02/2016, 11:42 AM

## 2016-06-04 ENCOUNTER — Telehealth: Payer: Self-pay | Admitting: Cardiology

## 2016-06-04 NOTE — Telephone Encounter (Signed)
Walk In Pt Form-Pt has questions about Medications that was prescribed-will give to Webb Silversmith L on Friday 11/3

## 2016-06-05 NOTE — Telephone Encounter (Signed)
I spoke with pt's wife, Ledon Snare advised to contact PCP or GI (she states pt sees Dr Carlean Purl) for recommendations regarding substitute for Florastor.  [name] requested that I leave information pt had dropped off  regarding Florastor at front desk for pt to pick up.  [name] advised I would do that.

## 2016-06-05 NOTE — Telephone Encounter (Signed)
I received message from patient stating he was given a prescription for Florastor 254m two times a day, signed by Dr IMart Piggsdated 06/02/16.  The [place] Lactobacillus Acidophillus.  LMTCB for pt.

## 2016-06-08 ENCOUNTER — Telehealth: Payer: Self-pay | Admitting: Internal Medicine

## 2016-06-08 MED ORDER — ACIDOPHILUS LACTOBACILLUS PO CAPS: 1.0000 | ORAL_CAPSULE | Freq: Every day | ORAL | 3 refills | Status: DC

## 2016-06-08 NOTE — Telephone Encounter (Signed)
OK - if he wants the [facility] that 1 each day # 30 11 RF

## 2016-06-08 NOTE — Telephone Encounter (Signed)
Please advise Sir? 

## 2016-06-08 NOTE — Telephone Encounter (Signed)
Faxed the rx for the acidophilus Lactobacillus to the [place] in [place] at fax # [phone].

## 2016-06-22 ENCOUNTER — Telehealth: Payer: Self-pay | Admitting: Internal Medicine

## 2016-06-22 DIAGNOSIS — N281 Cyst of kidney, acquired: Secondary | ICD-10-CM | POA: Diagnosis not present

## 2016-06-22 DIAGNOSIS — F4310 Post-traumatic stress disorder, unspecified: Secondary | ICD-10-CM | POA: Diagnosis not present

## 2016-06-22 DIAGNOSIS — N401 Enlarged prostate with lower urinary tract symptoms: Secondary | ICD-10-CM | POA: Diagnosis not present

## 2016-06-22 DIAGNOSIS — D631 Anemia in chronic kidney disease: Secondary | ICD-10-CM | POA: Diagnosis not present

## 2016-06-22 DIAGNOSIS — N2581 Secondary hyperparathyroidism of renal origin: Secondary | ICD-10-CM | POA: Diagnosis not present

## 2016-06-22 DIAGNOSIS — K5090 Crohn's disease, unspecified, without complications: Secondary | ICD-10-CM | POA: Diagnosis not present

## 2016-06-22 DIAGNOSIS — N200 Calculus of kidney: Secondary | ICD-10-CM | POA: Diagnosis not present

## 2016-06-22 DIAGNOSIS — Z86711 Personal history of pulmonary embolism: Secondary | ICD-10-CM | POA: Diagnosis not present

## 2016-06-22 DIAGNOSIS — E538 Deficiency of other specified B group vitamins: Secondary | ICD-10-CM | POA: Diagnosis not present

## 2016-06-22 DIAGNOSIS — E669 Obesity, unspecified: Secondary | ICD-10-CM | POA: Diagnosis not present

## 2016-06-22 DIAGNOSIS — M109 Gout, unspecified: Secondary | ICD-10-CM | POA: Diagnosis not present

## 2016-06-22 DIAGNOSIS — N183 Chronic kidney disease, stage 3 (moderate): Secondary | ICD-10-CM | POA: Diagnosis not present

## 2016-06-22 DIAGNOSIS — N189 Chronic kidney disease, unspecified: Secondary | ICD-10-CM | POA: Diagnosis not present

## 2016-06-22 DIAGNOSIS — M1990 Unspecified osteoarthritis, unspecified site: Secondary | ICD-10-CM | POA: Diagnosis not present

## 2016-06-22 DIAGNOSIS — I719 Aortic aneurysm of unspecified site, without rupture: Secondary | ICD-10-CM | POA: Diagnosis not present

## 2016-06-22 NOTE — Telephone Encounter (Signed)
Left detailed message that the uloric is for gout and that they may want to contact the PCP about that but to call me back.

## 2016-07-01 DIAGNOSIS — I4891 Unspecified atrial fibrillation: Secondary | ICD-10-CM | POA: Diagnosis not present

## 2016-07-01 DIAGNOSIS — Z1389 Encounter for screening for other disorder: Secondary | ICD-10-CM | POA: Diagnosis not present

## 2016-07-01 DIAGNOSIS — Z6827 Body mass index (BMI) 27.0-27.9, adult: Secondary | ICD-10-CM | POA: Diagnosis not present

## 2016-07-01 DIAGNOSIS — K5090 Crohn's disease, unspecified, without complications: Secondary | ICD-10-CM | POA: Diagnosis not present

## 2016-07-02 ENCOUNTER — Ambulatory Visit (INDEPENDENT_AMBULATORY_CARE_PROVIDER_SITE_OTHER): Payer: Medicare Other | Admitting: Cardiology

## 2016-07-02 ENCOUNTER — Encounter: Payer: Self-pay | Admitting: *Deleted

## 2016-07-02 ENCOUNTER — Encounter: Payer: Self-pay | Admitting: Cardiology

## 2016-07-02 ENCOUNTER — Telehealth: Payer: Self-pay | Admitting: Cardiology

## 2016-07-02 VITALS — BP 133/88 | HR 66 | Ht 72.0 in | Wt 203.5 lb

## 2016-07-02 DIAGNOSIS — I4891 Unspecified atrial fibrillation: Secondary | ICD-10-CM | POA: Diagnosis not present

## 2016-07-02 DIAGNOSIS — I2510 Atherosclerotic heart disease of native coronary artery without angina pectoris: Secondary | ICD-10-CM

## 2016-07-02 MED ORDER — APIXABAN 5 MG PO TABS: 5.0000 mg | ORAL_TABLET | Freq: Two times a day (BID) | ORAL | 0 refills | Status: DC

## 2016-07-02 MED ORDER — AMIODARONE HCL 200 MG PO TABS: 200.0000 mg | ORAL_TABLET | Freq: Two times a day (BID) | ORAL | 0 refills | Status: DC

## 2016-07-02 NOTE — Telephone Encounter (Signed)
Pharmacy notified to have prescription on file, pt was given written prescription to get filled at [facility].

## 2016-07-02 NOTE — Telephone Encounter (Signed)
New message       Pt was seen today. Someone called and told pharmacist not to fill amiodarone.  They are calling to see if the presc needs to be put on hold to be filled at a later date or not to fill presc at all.  Please call
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
## 2016-07-02 NOTE — Patient Instructions (Signed)
Medication Instructions:  Decrease amiodarone to 248m two times a day.  Take Xarelto until you have surgery. After you have surgery, start Eliquis 563mtwo times a day instead of Xarelto.  Labwork: None   Testing/Procedures: None   Follow-Up: Your physician recommends that you schedule a follow-up appointment in: 4-6 weeks with BrMare LoanPA-c   Any Other Special Instructions Will Be Listed Below (If Applicable).     If you need a refill on your cardiac medications before your next appointment, please call your pharmacy.

## 2016-07-03 DIAGNOSIS — I4891 Unspecified atrial fibrillation: Secondary | ICD-10-CM | POA: Diagnosis not present

## 2016-07-03 DIAGNOSIS — K635 Polyp of colon: Secondary | ICD-10-CM | POA: Diagnosis not present

## 2016-07-03 DIAGNOSIS — I712 Thoracic aortic aneurysm, without rupture: Secondary | ICD-10-CM | POA: Diagnosis not present

## 2016-07-05 NOTE — Progress Notes (Signed)
He will need new cardiologist, but since he's from [place] may want to be seen in [place] or [place].

## 2016-07-06 NOTE — Progress Notes (Signed)
Pt states he prefers to see cardiologist in [place], will place recall for Dr End.

## 2016-07-07 DIAGNOSIS — N189 Chronic kidney disease, unspecified: Secondary | ICD-10-CM | POA: Diagnosis not present

## 2016-07-13 ENCOUNTER — Telehealth: Payer: Self-pay | Admitting: Cardiology

## 2016-07-13 NOTE — Telephone Encounter (Signed)
New Message:    Pt is on Xarelto,he have started having headaches. This is one of the side effects of Xarelto.Please call to advise.

## 2016-07-13 NOTE — Telephone Encounter (Signed)
Previous pt of Dr. Aundra Dubin. To follow up with Dr. Saunders Revel. I spoke with pt's wife. She reports pt has nagging,dull headache.  Started about 2 weeks ago.  Does not occur every day. Thinks it is related to Xarelto.  This was started when pt in hospital in October. Amiodarone also started at that time.  No cold or sinus symptoms. BP 134/88 earlier today and most recently 123/78.  He has been taking BC for headache.  I told pt's wife he should not take [facility] and recommended he take acetaminophen. Wife states acetaminophen does not help headache.  He is also taking Alka Seltzer at times for gas.  I told wife pt should not take this. I told wife I would send message to provider to see if he felt headache was related to Xarelto.

## 2016-07-13 NOTE — Telephone Encounter (Signed)
Discussed with pt's wife, she verbalized understanding, agreed with plan.

## 2016-07-13 NOTE — Telephone Encounter (Signed)
I will forward to Dr McLean for review 

## 2016-07-13 NOTE — Telephone Encounter (Signed)
Unlikely to be related to Xarelto.  This time of year, may be sinus infection.  Would get evaluation by PCP and not stop any meds at this time.  Head bleed unlikely with only mild symptoms, but needs evaluation.

## 2016-07-16 NOTE — Progress Notes (Signed)
Pt is being scheduled for preop appt; please place surgical orders in epic. Thanks.  

## 2016-07-20 ENCOUNTER — Other Ambulatory Visit: Payer: Self-pay | Admitting: Surgery

## 2016-08-03 DIAGNOSIS — C189 Malignant neoplasm of colon, unspecified: Secondary | ICD-10-CM

## 2016-08-03 DIAGNOSIS — R001 Bradycardia, unspecified: Secondary | ICD-10-CM

## 2016-08-03 HISTORY — PX: COLON SURGERY: SHX602

## 2016-08-03 HISTORY — DX: Malignant neoplasm of colon, unspecified: C18.9

## 2016-08-03 HISTORY — DX: Bradycardia, unspecified: R00.1

## 2016-08-05 ENCOUNTER — Ambulatory Visit: Payer: Medicare Other | Admitting: Cardiology

## 2016-08-06 DIAGNOSIS — J069 Acute upper respiratory infection, unspecified: Secondary | ICD-10-CM | POA: Diagnosis not present

## 2016-08-06 DIAGNOSIS — E663 Overweight: Secondary | ICD-10-CM | POA: Diagnosis not present

## 2016-08-06 DIAGNOSIS — Z6828 Body mass index (BMI) 28.0-28.9, adult: Secondary | ICD-10-CM | POA: Diagnosis not present

## 2016-08-07 NOTE — Patient Instructions (Addendum)
Derek [name]  08/07/2016   Your procedure is scheduled on:08/14/16   Report to [place]  Entrance take [facility]  elevators to 3rd floor to  [place] at   [place] AM.  Call this number if you have problems the morning of surgery [phone]   Remember: ONLY 1 PERSON MAY GO WITH YOU TO SHORT STAY TO GET  READY MORNING OF [place].  Do not eat food or drink liquids :After Midnight.     Take these medicines the morning of surgery with A SIP OF WATER: amiodarone(pacerone), eye drops as usual, prilosec (omeprazole)                                You may not have any metal on your body including hair pins and              piercings  Do not wear jewelry,lotions, powders or perfumes, deodorant                     Men may shave face and neck.   Do not bring valuables to the hospital. [place].  Contacts, dentures or bridgework may not be worn into surgery.  Leave suitcase in the car. After surgery it may be brought to your room.     Patients discharged the day of surgery will not be allowed to drive home.  Name and phone number of your driver:  Special Instructions: N/A              Please read over the following fact sheets you were given: _____________________________________________________________________             [facility] - Preparing for Surgery Before surgery, you can play an important role.  Because skin is not sterile, your skin needs to be as free of germs as possible.  You can reduce the number of germs on your skin by washing with CHG (chlorahexidine gluconate) soap before surgery.  CHG is an antiseptic cleaner which kills germs and bonds with the skin to continue killing germs even after washing. Please DO NOT use if you have an allergy to CHG or antibacterial soaps.  If your skin becomes reddened/irritated stop using the CHG and inform your nurse when you arrive at Short  Stay. Do not shave (including legs and underarms) for at least 48 hours prior to the first CHG shower.  You may shave your face/neck. Please follow these instructions carefully:  1.  Shower with CHG Soap the night before surgery and the  morning of Surgery.  2.  If you choose to wash your hair, wash your hair first as usual with your  normal  shampoo.  3.  After you shampoo, rinse your hair and body thoroughly to remove the  shampoo.                           4.  Use CHG as you would any other liquid soap.  You can apply chg directly  to the skin and wash                       Gently with a  scrungie or clean washcloth.  5.  Apply the CHG Soap to your body ONLY FROM THE NECK DOWN.   Do not use on face/ open                           Wound or open sores. Avoid contact with eyes, ears mouth and genitals (private parts).                       Wash face,  Genitals (private parts) with your normal soap.             6.  Wash thoroughly, paying special attention to the area where your surgery  will be performed.  7.  Thoroughly rinse your body with warm water from the neck down.  8.  DO NOT shower/wash with your normal soap after using and rinsing off  the CHG Soap.                9.  Pat yourself dry with a clean towel.            10.  Wear clean pajamas.            11.  Place clean sheets on your bed the night of your first shower and do not  sleep with pets. Day of Surgery : Do not apply any lotions/deodorants the morning of surgery.  Please wear clean clothes to the hospital/surgery center.  FAILURE TO FOLLOW THESE INSTRUCTIONS MAY RESULT IN THE CANCELLATION OF YOUR SURGERY PATIENT SIGNATURE_________________________________  NURSE SIGNATURE__________________________________  ________________________________________________________________________  WHAT IS A BLOOD TRANSFUSION? Blood Transfusion Information  A transfusion is the replacement of blood or some of its parts. Blood is made up of  multiple cells which provide different functions.  Red blood cells carry oxygen and are used for blood loss replacement.  White blood cells fight against infection.  Platelets control bleeding.  Plasma helps clot blood.  Other blood products are available for specialized needs, such as hemophilia or other clotting disorders. BEFORE THE TRANSFUSION  Who gives blood for transfusions?   Healthy volunteers who are fully evaluated to make sure their blood is safe. This is blood bank blood. Transfusion therapy is the safest it has ever been in the practice of medicine. Before blood is taken from a donor, a complete history is taken to make sure that person has no history of diseases nor engages in risky social behavior (examples are intravenous drug use or sexual activity with multiple partners). The donor's travel history is screened to minimize risk of transmitting infections, such as malaria. The donated blood is tested for signs of infectious diseases, such as HIV and hepatitis. The blood is then tested to be sure it is compatible with you in order to minimize the chance of a transfusion reaction. If you or a relative donates blood, this is often done in anticipation of surgery and is not appropriate for emergency situations. It takes many days to process the donated blood. RISKS AND COMPLICATIONS Although transfusion therapy is very safe and saves many lives, the main dangers of transfusion include:   Getting an infectious disease.  Developing a transfusion reaction. This is an allergic reaction to something in the blood you were given. Every precaution is taken to prevent this. The decision to have a blood transfusion has been considered carefully by your caregiver before blood is given. Blood is not given unless the benefits outweigh the risks. AFTER  THE TRANSFUSION  Right after receiving a blood transfusion, you will usually feel much better and more energetic. This is especially true if  your red blood cells have gotten low (anemic). The transfusion raises the level of the red blood cells which carry oxygen, and this usually causes an energy increase.  The nurse administering the transfusion will monitor you carefully for complications. HOME CARE INSTRUCTIONS  No special instructions are needed after a transfusion. You may find your energy is better. Speak with your caregiver about any limitations on activity for underlying diseases you may have. SEEK MEDICAL CARE IF:   Your condition is not improving after your transfusion.  You develop redness or irritation at the intravenous (IV) site. SEEK IMMEDIATE MEDICAL CARE IF:  Any of the following symptoms occur over the next 12 hours:  Shaking chills.  You have a temperature by mouth above 102 F (38.9 C), not controlled by medicine.  Chest, back, or muscle pain.  People around you feel you are not acting correctly or are confused.  Shortness of breath or difficulty breathing.  Dizziness and fainting.  You get a rash or develop hives.  You have a decrease in urine output.  Your urine turns a dark color or changes to pink, red, or brown. Any of the following symptoms occur over the next 10 days:  You have a temperature by mouth above 102 F (38.9 C), not controlled by medicine.  Shortness of breath.  Weakness after normal activity.  The white part of the eye turns yellow (jaundice).  You have a decrease in the amount of urine or are urinating less often.  Your urine turns a dark color or changes to pink, red, or brown. Document Released: 07/17/2000 Document Revised: 10/12/2011 Document Reviewed: 03/05/2008 ExitCare Patient Information 2014 ExitCare, [place].  _______________________________________________________________________   CLEAR LIQUID DIET   Foods Allowed                                                                     Foods Excluded  Coffee and tea, regular and decaf                              liquids that you cannot  Plain Jell-O in any flavor                                             see through such as: Fruit ices (not with fruit pulp)                                     milk, soups, orange juice  Iced Popsicles                                    All solid food Carbonated beverages, regular and diet  Cranberry, grape and apple juices Sports drinks like Gatorade Lightly seasoned clear broth or consume(fat free) Sugar, honey syrup  Sample Menu Breakfast                                Lunch                                     Supper Cranberry juice                    Beef broth                            Chicken broth Jell-O                                     Grape juice                           Apple juice Coffee or tea                        Jell-O                                      Popsicle                                                Coffee or tea                        Coffee or tea  _____________________________________________________________________

## 2016-08-10 ENCOUNTER — Encounter (HOSPITAL_COMMUNITY)
Admission: RE | Admit: 2016-08-10 | Discharge: 2016-08-10 | Disposition: A | Discharge status: Home / Self Care | Payer: Medicare Other | Source: Ambulatory Visit | Attending: Surgery | Admitting: Surgery

## 2016-08-10 ENCOUNTER — Encounter (HOSPITAL_COMMUNITY): Payer: Self-pay

## 2016-08-10 DIAGNOSIS — Z01812 Encounter for preprocedural laboratory examination: Secondary | ICD-10-CM | POA: Diagnosis not present

## 2016-08-10 DIAGNOSIS — D490 Neoplasm of unspecified behavior of digestive system: Secondary | ICD-10-CM | POA: Diagnosis not present

## 2016-08-10 HISTORY — DX: Major depressive disorder, single episode, unspecified: F32.9

## 2016-08-10 HISTORY — DX: Depression, unspecified: F32.A

## 2016-08-10 HISTORY — DX: Unspecified atrial fibrillation: I48.91

## 2016-08-10 LAB — COMPREHENSIVE METABOLIC PANEL
ALT: 27 U/L (ref 17–63)
AST: 25 U/L (ref 15–41)
Albumin: 4 g/dL (ref 3.5–5.0)
Alkaline Phosphatase: 132 U/L — ABNORMAL HIGH (ref 38–126)
Anion gap: 8 (ref 5–15)
BILIRUBIN TOTAL: 1.1 mg/dL (ref 0.3–1.2)
BUN: 22 mg/dL — AB (ref 6–20)
CHLORIDE: 101 mmol/L (ref 101–111)
CO2: 27 mmol/L (ref 22–32)
CREATININE: 2.03 mg/dL — AB (ref 0.61–1.24)
Calcium: 9.7 mg/dL (ref 8.9–10.3)
GFR calc Af Amer: 36 mL/min — ABNORMAL LOW (ref >60)
GFR, EST NON AFRICAN AMERICAN: 31 mL/min — AB (ref >60)
Glucose, Bld: 105 mg/dL — ABNORMAL HIGH (ref 65–99)
Potassium: 7.1 mmol/L (ref 3.5–5.1)
Sodium: 136 mmol/L (ref 135–145)
TOTAL PROTEIN: 6.6 g/dL (ref 6.5–8.1)

## 2016-08-10 LAB — CBC WITH DIFFERENTIAL/PLATELET
Basophils Absolute: 0 10*3/uL (ref 0.0–0.1)
Basophils Relative: 1 %
Eosinophils Absolute: 0.2 10*3/uL (ref 0.0–0.7)
Eosinophils Relative: 3 %
HCT: 44.6 % (ref 39.0–52.0)
Hemoglobin: 14.4 g/dL (ref 13.0–17.0)
Lymphocytes Relative: 15 %
Lymphs Abs: 0.9 10*3/uL (ref 0.7–4.0)
MCH: 29 pg (ref 26.0–34.0)
MCHC: 32.3 g/dL (ref 30.0–36.0)
MCV: 89.9 fL (ref 78.0–100.0)
Monocytes Absolute: 0.4 10*3/uL (ref 0.1–1.0)
Monocytes Relative: 6 %
Neutro Abs: 4.3 10*3/uL (ref 1.7–7.7)
Neutrophils Relative %: 75 %
Platelets: 118 10*3/uL — ABNORMAL LOW (ref 150–400)
RBC: 4.96 MIL/uL (ref 4.22–5.81)
RDW: 14 % (ref 11.5–15.5)
WBC: 5.8 10*3/uL (ref 4.0–10.5)

## 2016-08-10 NOTE — Progress Notes (Signed)
EKG- 07/02/16- EPIC along with [facility]- EPIC  05/29/16- [place]  03/2016- LOV with Dr Servando Snare in [facility] discusses AAA.   Stress 2015- EPIC

## 2016-08-10 NOTE — Progress Notes (Signed)
Called office of [facility] and spoke with Abigail Butts ( Triage Nurse) and informed her that I had left message with front office supervisor , Anderson Malta, and that nurse on call to be call me from Dr Hilma Favors office since MD not in office today and had left a message on voice  mail of Lorane Gell regarding patient's potassium and all of above at [facility] aware if 70 ounces if oj daily intake by patient reported by wife.

## 2016-08-10 NOTE — Progress Notes (Signed)
Called and left another message on the switchboard voice mail regarding patient and left phone number of [phone] for a call back.

## 2016-08-10 NOTE — Progress Notes (Signed)
Derek [name] called back from [facility] and stated Dr Arty Baumgartner was calling patient and wife and calling in [facility] for patient related to elevated potassium from today of 7.1.

## 2016-08-10 NOTE — Progress Notes (Signed)
Patient scored a "5" on the ST[employer] Bang Assessment Tool for Obsructive Sleep Apnea at preop visit.  According to this tool patient is at high risk for Obstructive Sleep Apnea.  FYI.

## 2016-08-10 NOTE — Progress Notes (Signed)
Called office of [facility] and spoke with [facility] and informed her that [facility] had returned call and Dr Arty Baumgartner along with Lorane Gell was calling patient and wife and calling in some [place] for patient and that office of Dr Delanna Ahmadi office had never returned call but that I have left them a message stating what Dr Arty Baumgartner has done above.

## 2016-08-10 NOTE — Progress Notes (Signed)
Called office of Dr Hilma Favors and spoke with office staff.  Office staff verified that Derek [name]  along with his Letta Median, RN has message regarding patient and will call us once finished seeing patients.   Called office of  [facility] and spoke with Lorane Gell , assistant to Dr Deterding.  Dr Deterding, not in office today per Lorane Gell but he is having another MD evaluate lab reports and will be back in touch.  Lorane Gell is aware nurse is in office today until 1615pm.

## 2016-08-10 NOTE — Progress Notes (Addendum)
Letta Median, RN from office of Dr Hilma Favors called back and stated Derek [name] aware of abnormal labs on patient and that Dr Hilma Favors was not in office this week but at another facility that she could reach him and she was sending him all information regarding abnormal potassium and that Dr Arty Baumgartner at [place] [phone] per Lorane Gell at [facility] was calling in [facility] on patient and calling patient and wife,  Letta Median stated she could see all info in epic as well as Dr Hilma Favors.   Letta Median, RN at Dr Delanna Ahmadi office asked if labs would be repeated by office of Dr Colodonato/Deterding. I told her that Lorane Gell did not tell me that information.  I gave her the phone number for [facility] at [phone].

## 2016-08-10 NOTE — Progress Notes (Signed)
Anesthesia ( Dr Tobias Alexander and Dr Lissa Hoard) aware of AAA last measurement of 5.1-5.3 on 03/11/2016 followed by Dr Servando Snare.  No new orders given.

## 2016-08-10 NOTE — Progress Notes (Signed)
CRITICAL VALUE ALERT  Critical value received:  Potassium  Date of notification:  08/10/2016  Time of notification:  5830 AM  Critical value read back:YES   Nurse who received alert:  Charmaine Downs. Tobin Chad  MD notified (1st page):  Dr. [name]  Time of first page:  7460 am by April Staton,CMA at [facility]  MD notified (2nd page):  Time of second page:  Responding MD:  Dr. [name]  Time MD responded:  1137 am Per orders from Dr. [name]- call patient and inform patient to STOP his Urocrit-K, call his PCP and inform them of lab result and let them decide of what they want him to do and also get a Potassium level drawn morning of surgery! Read instructions back to Surgeon, Dr. [name]!

## 2016-08-10 NOTE — Progress Notes (Signed)
Called and left a message on the switchboard message center for [facility] that Dr Arty Baumgartner at [place] is presently per phone call from Lorane Gell at [place] in [place] order to pharmacy and to patient and wife.  Left on message for Charleen Kirks to please call nurse back at [phone] so she is aware he received this message.

## 2016-08-10 NOTE — Progress Notes (Signed)
Spoke with office personnel at [employer] [employer] and they stated Derek [name] was in office today and he would received the vioce mail message that I had left him earlier.

## 2016-08-10 NOTE — Progress Notes (Signed)
[facility] with critical lab result (POTASSIUM- 7.1) and spoke with April Staton, CMA and she will page Dr. [name] to call me at my phone number and I will speak to him.

## 2016-08-10 NOTE — Progress Notes (Signed)
Called offfce of PC[facility]- Dr Sharilyn Sites regarding potassium of 7.1.. Dr Hilma Favors not in office and spoke with Anderson Malta the front office supervisor and she took message regarding potassium of 7.1, I have instructed patient 's wife for patient to stop Urocit K , potassium to be rechecked am of surgery.  , per patient's wife patietn is drinking 70 ounces of orange juice daily per wife.  I informed jennifer the front office supervisor of all of above and she will give message to RN who is on call of PCP and RN to call me back.  I spoke with wife of patient and she is aware and voiced understanding of potassium of 7.1 and normal results are 3.5-5.1.  I told her Dr [name] is aware and will also notify PCP, Dr Sharilyn Sites and office of Dr Jimmy Footman.  ( wife states Dr Jimmy Footman is the one who prescribed the Urocit K.   Left voice mail messge for Lorane Gell , assistant for Dr Deterding regarding all of above.  Left  phone number of [phone].   CMP routed via epic to Dr Deterding and Dr Hilma Favors.

## 2016-08-11 LAB — HEMOGLOBIN A1C
Hgb A1c MFr Bld: 4.8 % (ref 4.8–5.6)
Mean Plasma Glucose: 91 mg/dL

## 2016-08-13 NOTE — H&P (Signed)
Derek [name]. [place]  Location: [facility] Patient #: [id number] DOB: 1941-01-03 Married / Language: English / Race: White Male  History of Present Illness   The patient is a 76 year old male who presents with a complaint of colonic polyp.   He goes by "Derek [name]"  The PCP is Dr. Eddie Candle  The patient was referred by Dr. Darci Current  He comes with his wife and Sharmaine Base.  I had Derek [name] set up for surgery on 05/26/2016, but he presented in atrial fibrillation ... so his surgery has been delayed. Cardiology wanted to anticoagulate him for one month before considering surgery. He saw [name], PA, yesterday who cleared him for surgery. I think that his regular cardiologist is Dr. Einar Crow. He is in a NSR on EKG. He does have a reduced ER, but no wall abnormalities. Because of his renal function, will switch him from Xarelto to Eliquis afer the colon resection. The frustrating thing for [name] is that it is the first of December and I have no large block of time for the surgery until January. His originial pathology is very favorable, though not ideal, I don't think that the wait will influence the course of his disease. I again went over the operation, the risk, and the recovery. He has a good handle on the operation. I went over the preoperative mechanical and antibiotic bowel prep for colon surgery and provided prescriptions for these. He is accompanied by his wife and Sharmaine Base.  History of colonic polyp: Derek [name] underwent a colonoscopy on 03/18/2016. He was found [phone]) to have a flat polyp in the right transverse colon that showed high grade dysplasia with focal area suspicious for superficial invasion. He is referrred for discussion of resection of this portion of his colon. He has long standing Crohn's disease - he underwent a ileocolonic resection around 1978. This operation did not go  well, he needed a second operation, he has what sounds like a fistula, and he was on TPN for a while post op. He also said that he had to have additional small bowel resected. He was followed by Dr. Cristal Generous. He is not currently on treatment for his Crohn's diseae.  I reviewed with the patient the findings and need for colon surgery. I discussed both surgical and non surgical options. I discussed the role of laparoscopic and open surgery in colon surgery. I reviewed the risks of surgery, including, but not limited to, infection, bleeding, nerve injury, anastomotic leaks, and possibility of colostomy.  Past Medical History: 1. History of Crohn's Prior ileocecectomy around 1978. He required a second operation. He had a fistula that took months to heal. He did not have straight forward surgery. He developed pancreatitis when placed on Humira. He tried 6 MP - but this caused renal problem He has has some prior SBO  2. I saw him for SBO in 12/09/2010 3. History of PE It souds like this occurred when he was hosptialized for a SBO - he was on coumadin for a year - he's had no trouble since then 4. History of Hep C - from blood transfusion during his bowel resection in the 1970's 5. Followed by Dr. Aundra Dubin for cardiology 6. Nephrolithiasis 7. Dilated ascending aortic root followed by Dr. Servando Snare He last saw Dr. Servando Snare on 03/19/2016 Currently root is 5.1 cm - consider replacement at 5.5 cm 8. 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 Chronic renal insufficiency Seen by  Dr. Lenna Sciara. Deterding q 3 months 10. He has seen Dr. Aundra Dubin for cardiology - will get cards clearance pre op 11. Gout in right great toe - last attack several years  Social History: Married. Wife is [name]. He is a retired Clinical biochemist. He has 2 chldren - 40 and 74 yo. They know Sharmaine Base   Other Problems Shann Medal,  MD; 07/03/2016 1:36 PM) Anxiety Disorder  Arthritis  Chronic Renal Failure Syndrome  Crohn's Disease  Depression  Gastroesophageal Reflux Disease  Heart murmur  Hemorrhoids  Hepatitis  Inguinal Hernia  Kidney Stone  Pancreatitis  Pulmonary Embolism / Blood Clot in Legs   Past Surgical History Shann Medal, MD; 07/03/2016 1:36 PM) Colon Polyp Removal - Colonoscopy  Colon Removal - Partial  Knee Surgery  Left. Open Inguinal Hernia Surgery  Right. Resection of Small Bowel  Shoulder Surgery  Left.  Allergies (Sonya Bynum, CMA; 07/03/2016 3:04 PM) Allopurinol *CHEMICALS*  LORazepam *ANTIANXIETY AGENTS*   Medication History (Sonya Bynum, CMA; 07/03/2016 3:06 PM) Neomycin Sulfate (500MG Tablet, 2 (two) Tablet Oral SEE NOTE, Taken starting 05/22/2016) Active. (TAKE TWO TABLETS AT 2 PM, 3 PM, AND 10 PM THE DAY PRIOR TO SURGERY) Uloric (40MG Tablet, Oral) Active. Magnesium (200MG Tablet, Oral) Active. Cyanocobalamin (100MCG/ML Solution, Injection) Active. Multiple Vitamin (Oral) Active. Terazosin HCl (1MG Capsule, Oral) Active. TraZODone HCl (100MG Tablet, Oral) Active. Colcrys (0.6MG Tablet, Oral) Active. Omeprazole (20MG Capsule DR, Oral) Active. Paxil (10MG Tablet, Oral) Active. Atenolol (100MG Tablet, Oral) Active. Hydrocodone-Acetaminophen (5-325MG Tablet, Oral) Active. Tums (500MG Tablet Chewable, Oral) Active. Refresh (1% Solution, Ophthalmic) Active. Fish Oil + D3 (1000-1000MG-UNIT Capsule, Oral) Active. Gabapentin (100MG Capsule, Oral) Active. Potassium Chloride (10MEQ Capsule ER, Oral) Active. Vitamin D (Cholecalciferol) (1000UNIT Capsule, Oral) Active. HydrOXYzine Pamoate (25MG Capsule, Oral) Active. Xarelto (15MG Tablet, Oral) Active. Minocycline HCl (100MG Capsule, Oral) Active. Amiodarone HCl (400MG Tablet, Oral) Active. Medications Reconciled  Vitals (Sonya Bynum CMA; 07/03/2016 3:04 PM) 07/03/2016 3:04 PM Weight: 204  lb Height: 70in Body Surface Area: 2.1 m Body Mass Index: 29.27 kg/m  Temp.: 39F(Temporal)  Pulse: 79 (Regular)  BP: 128/82 (Sitting, Left Arm, Standard)   Physical Exam  General: WN older WM alert and generally healthy appearing. Skin: Inspection and palpation of the skin unremarkable.  Eyes: Conjunctivae white, pupils equal. Face, ears, nose, mouth, and throat: Face - normal. Normal ears and nose. Lips and teeth normal.  Neck: Supple. No mass. Trachea midline. No thyroid mass.  Lymph Nodes: No supraclavicular or cervical adenopathy.  Lungs: Normal respiratory effort. Clear to auscultation and symmetric breath sounds. Cardiovascular: Regular rate and rythm today. Normal auscultation of the heart. No murmur or rub. Normal carotid pulse.  Abdomen: Soft. No mass. Liver and spleen not palpable. No tenderness. No hernia. Normal bowel sounds.  Two right paramedian scars - one large in the lower abdomen and one in the upper abdomen. Rectal: Not done.  Musculoskeletal/extremities: Normal gait. Good strength and ROM in upper and lower extremities.   Neurologic: Grossly intact to motor and sensory function.  No obvious deficit in the cranial nerves.  Psychiatric: Has normal mood and affect. Judgement and insight appear normal.   Assessment & Plan  1.  DYSPLASTIC COLON POLYP (K63.5)  Story: Tubular adenoma with high grade dysplasia and suspicious for superficial invastion - - 03/18/2016 - [facility]. [place]   1) Segmental resection of transverse colon - he has pre op bowel and antibiotic prep.  2.  ASCENDING AORTIC ANEURYSM (I71.2)  Impression:  Followed by Dr. Servando Snare  He last saw Dr. Servando Snare on 03/19/2016 Currently root is 5.1 cm - consider replacement at 5.5 cm 3.  ATRIAL FIBRILLATION, CONTROLLED (I48.91)  4. History of Crohn's Prior ileocecectomy around 1978. He required a second operation. He had a  fistula that took months to heal. He did not have straight forward surgery. He developed pancreatitis when placed on Humira. He tried 6 MP - but this caused renal problem He has has some prior SBO  5. History of PE It souds like this occurred when he was hosptialized for a SBO - he was on coumadin for a year - he's had no trouble since then 6. History of Hep C - from blood transfusion during his bowel resection in the 1970's 7. Followed by Dr. Aundra Dubin for cardiology 8. Nephrolithiasis  9. 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 Chronic renal insufficiency Seen by Dr. Lenna Sciara. Deterding q 3 months 11. Gout in right great toe - last attack several years   [name], MD, [facility] Surgery Pager: [phone] Office phone:  [phone]

## 2016-08-14 ENCOUNTER — Inpatient Hospital Stay (HOSPITAL_COMMUNITY): Payer: Medicare Other | Admitting: Anesthesiology

## 2016-08-14 ENCOUNTER — Encounter (HOSPITAL_COMMUNITY): Payer: Self-pay | Admitting: *Deleted

## 2016-08-14 ENCOUNTER — Encounter (HOSPITAL_COMMUNITY)
Admission: RE | Disposition: A | Discharge status: Home / Self Care | Payer: Self-pay | Source: Ambulatory Visit | Attending: Surgery

## 2016-08-14 ENCOUNTER — Inpatient Hospital Stay (HOSPITAL_COMMUNITY)
Admission: RE | Admit: 2016-08-14 | Discharge: 2016-08-21 | DRG: 330 | Disposition: A | Discharge status: Home / Self Care | Payer: Medicare Other | Source: Ambulatory Visit | Attending: Surgery | Admitting: Surgery

## 2016-08-14 DIAGNOSIS — Z86711 Personal history of pulmonary embolism: Secondary | ICD-10-CM

## 2016-08-14 DIAGNOSIS — I4891 Unspecified atrial fibrillation: Secondary | ICD-10-CM | POA: Diagnosis present

## 2016-08-14 DIAGNOSIS — Z96612 Presence of left artificial shoulder joint: Secondary | ICD-10-CM | POA: Diagnosis present

## 2016-08-14 DIAGNOSIS — N183 Chronic kidney disease, stage 3 unspecified: Secondary | ICD-10-CM | POA: Diagnosis present

## 2016-08-14 DIAGNOSIS — I10 Essential (primary) hypertension: Secondary | ICD-10-CM | POA: Diagnosis present

## 2016-08-14 DIAGNOSIS — Z841 Family history of disorders of kidney and ureter: Secondary | ICD-10-CM

## 2016-08-14 DIAGNOSIS — I480 Paroxysmal atrial fibrillation: Secondary | ICD-10-CM | POA: Diagnosis present

## 2016-08-14 DIAGNOSIS — I712 Thoracic aortic aneurysm, without rupture: Secondary | ICD-10-CM | POA: Diagnosis not present

## 2016-08-14 DIAGNOSIS — I714 Abdominal aortic aneurysm, without rupture: Secondary | ICD-10-CM | POA: Diagnosis not present

## 2016-08-14 DIAGNOSIS — Z9049 Acquired absence of other specified parts of digestive tract: Secondary | ICD-10-CM | POA: Diagnosis not present

## 2016-08-14 DIAGNOSIS — Z8249 Family history of ischemic heart disease and other diseases of the circulatory system: Secondary | ICD-10-CM

## 2016-08-14 DIAGNOSIS — K55049 Acute infarction of large intestine, extent unspecified: Secondary | ICD-10-CM | POA: Diagnosis not present

## 2016-08-14 DIAGNOSIS — Z86718 Personal history of other venous thrombosis and embolism: Secondary | ICD-10-CM | POA: Diagnosis not present

## 2016-08-14 DIAGNOSIS — N1832 Chronic kidney disease, stage 3b: Secondary | ICD-10-CM | POA: Diagnosis present

## 2016-08-14 DIAGNOSIS — G629 Polyneuropathy, unspecified: Secondary | ICD-10-CM | POA: Diagnosis present

## 2016-08-14 DIAGNOSIS — F4310 Post-traumatic stress disorder, unspecified: Secondary | ICD-10-CM | POA: Diagnosis present

## 2016-08-14 DIAGNOSIS — N2581 Secondary hyperparathyroidism of renal origin: Secondary | ICD-10-CM | POA: Diagnosis not present

## 2016-08-14 DIAGNOSIS — Z87891 Personal history of nicotine dependence: Secondary | ICD-10-CM | POA: Diagnosis not present

## 2016-08-14 DIAGNOSIS — I429 Cardiomyopathy, unspecified: Secondary | ICD-10-CM | POA: Diagnosis present

## 2016-08-14 DIAGNOSIS — K219 Gastro-esophageal reflux disease without esophagitis: Secondary | ICD-10-CM | POA: Diagnosis present

## 2016-08-14 DIAGNOSIS — K56609 Unspecified intestinal obstruction, unspecified as to partial versus complete obstruction: Secondary | ICD-10-CM | POA: Diagnosis not present

## 2016-08-14 DIAGNOSIS — Z7901 Long term (current) use of anticoagulants: Secondary | ICD-10-CM | POA: Diagnosis not present

## 2016-08-14 DIAGNOSIS — R001 Bradycardia, unspecified: Secondary | ICD-10-CM | POA: Diagnosis present

## 2016-08-14 DIAGNOSIS — Z8614 Personal history of Methicillin resistant Staphylococcus aureus infection: Secondary | ICD-10-CM | POA: Diagnosis not present

## 2016-08-14 DIAGNOSIS — Z87442 Personal history of urinary calculi: Secondary | ICD-10-CM | POA: Diagnosis not present

## 2016-08-14 DIAGNOSIS — M109 Gout, unspecified: Secondary | ICD-10-CM | POA: Diagnosis not present

## 2016-08-14 DIAGNOSIS — I7121 Aneurysm of the ascending aorta, without rupture: Secondary | ICD-10-CM | POA: Diagnosis present

## 2016-08-14 DIAGNOSIS — K660 Peritoneal adhesions (postprocedural) (postinfection): Secondary | ICD-10-CM | POA: Diagnosis not present

## 2016-08-14 DIAGNOSIS — Z8619 Personal history of other infectious and parasitic diseases: Secondary | ICD-10-CM

## 2016-08-14 DIAGNOSIS — K5090 Crohn's disease, unspecified, without complications: Secondary | ICD-10-CM | POA: Diagnosis not present

## 2016-08-14 DIAGNOSIS — D123 Benign neoplasm of transverse colon: Secondary | ICD-10-CM | POA: Diagnosis present

## 2016-08-14 DIAGNOSIS — I428 Other cardiomyopathies: Secondary | ICD-10-CM

## 2016-08-14 DIAGNOSIS — D696 Thrombocytopenia, unspecified: Secondary | ICD-10-CM | POA: Diagnosis present

## 2016-08-14 DIAGNOSIS — I129 Hypertensive chronic kidney disease with stage 1 through stage 4 chronic kidney disease, or unspecified chronic kidney disease: Secondary | ICD-10-CM | POA: Diagnosis present

## 2016-08-14 DIAGNOSIS — C184 Malignant neoplasm of transverse colon: Principal | ICD-10-CM | POA: Diagnosis present

## 2016-08-14 DIAGNOSIS — N184 Chronic kidney disease, stage 4 (severe): Secondary | ICD-10-CM | POA: Diagnosis present

## 2016-08-14 HISTORY — PX: COLON RESECTION: SHX5231

## 2016-08-14 LAB — TYPE AND SCREEN
ABO/RH(D): O POS
Antibody Screen: NEGATIVE

## 2016-08-14 LAB — POTASSIUM: Potassium: 4.7 mmol/L (ref 3.5–5.1)

## 2016-08-14 LAB — MRSA PCR SCREENING: MRSA by PCR: NEGATIVE

## 2016-08-14 SURGERY — COLON RESECTION LAPAROSCOPIC
Anesthesia: General

## 2016-08-14 MED ORDER — HYDROMORPHONE HCL 1 MG/ML IJ SOLN
0.2500 mg | INTRAMUSCULAR | Status: DC | PRN
  Administered 2016-08-14 (×2): 0.5 mg via INTRAVENOUS

## 2016-08-14 MED ORDER — BUPIVACAINE LIPOSOME 1.3 % IJ SUSP
20.0000 mL | Freq: Once | INTRAMUSCULAR | Status: AC
  Administered 2016-08-14: 20 mL
  Filled 2016-08-14: qty 20

## 2016-08-14 MED ORDER — HYDROCODONE-ACETAMINOPHEN 5-325 MG PO TABS
1.0000 | ORAL_TABLET | ORAL | Status: DC | PRN
  Administered 2016-08-18: 1 via ORAL
  Administered 2016-08-19 (×4): 2 via ORAL
  Administered 2016-08-19: 1 via ORAL
  Administered 2016-08-20 – 2016-08-21 (×4): 2 via ORAL
  Filled 2016-08-14 (×2): qty 1
  Filled 2016-08-14 (×3): qty 2
  Filled 2016-08-14: qty 1
  Filled 2016-08-14: qty 2
  Filled 2016-08-14: qty 1
  Filled 2016-08-14 (×2): qty 2
  Filled 2016-08-14: qty 1
  Filled 2016-08-14: qty 2

## 2016-08-14 MED ORDER — DIAZEPAM 5 MG/ML IJ SOLN
5.0000 mg | Freq: Two times a day (BID) | INTRAMUSCULAR | Status: DC | PRN
  Administered 2016-08-15: 5 mg via INTRAVENOUS
  Filled 2016-08-14: qty 2

## 2016-08-14 MED ORDER — EPHEDRINE SULFATE 50 MG/ML IJ SOLN
INTRAMUSCULAR | Status: DC | PRN
  Administered 2016-08-14 (×2): 5 mg via INTRAVENOUS

## 2016-08-14 MED ORDER — PHENYLEPHRINE HCL 10 MG/ML IJ SOLN
INTRAMUSCULAR | Status: DC | PRN
  Administered 2016-08-14 (×2): 40 ug via INTRAVENOUS

## 2016-08-14 MED ORDER — ROCURONIUM BROMIDE 50 MG/5ML IV SOSY
PREFILLED_SYRINGE | INTRAVENOUS | Status: AC
  Filled 2016-08-14: qty 5

## 2016-08-14 MED ORDER — HEPARIN SODIUM (PORCINE) 5000 UNIT/ML IJ SOLN
5000.0000 [IU] | Freq: Three times a day (TID) | INTRAMUSCULAR | Status: DC
  Administered 2016-08-14 – 2016-08-21 (×19): 5000 [IU] via SUBCUTANEOUS
  Filled 2016-08-14 (×20): qty 1

## 2016-08-14 MED ORDER — LIDOCAINE 2% (20 MG/ML) 5 ML SYRINGE
INTRAMUSCULAR | Status: AC
  Filled 2016-08-14: qty 5

## 2016-08-14 MED ORDER — BUPIVACAINE HCL (PF) 0.25 % IJ SOLN
INTRAMUSCULAR | Status: AC
  Filled 2016-08-14: qty 30

## 2016-08-14 MED ORDER — FENTANYL CITRATE (PF) 100 MCG/2ML IJ SOLN
INTRAMUSCULAR | Status: AC
  Filled 2016-08-14: qty 2

## 2016-08-14 MED ORDER — DEXAMETHASONE SODIUM PHOSPHATE 10 MG/ML IJ SOLN
INTRAMUSCULAR | Status: DC | PRN
  Administered 2016-08-14: 10 mg via INTRAVENOUS

## 2016-08-14 MED ORDER — DEXAMETHASONE SODIUM PHOSPHATE 10 MG/ML IJ SOLN
INTRAMUSCULAR | Status: AC
  Filled 2016-08-14: qty 1

## 2016-08-14 MED ORDER — KCL IN DEXTROSE-NACL 10-5-0.45 MEQ/L-%-% IV SOLN
INTRAVENOUS | Status: DC
  Administered 2016-08-14 – 2016-08-15 (×3): via INTRAVENOUS
  Filled 2016-08-14 (×3): qty 1000

## 2016-08-14 MED ORDER — LACTATED RINGERS IV SOLN
INTRAVENOUS | Status: DC | PRN
  Administered 2016-08-14 (×3): via INTRAVENOUS

## 2016-08-14 MED ORDER — SUGAMMADEX SODIUM 200 MG/2ML IV SOLN
INTRAVENOUS | Status: DC | PRN
  Administered 2016-08-14: 200 mg via INTRAVENOUS

## 2016-08-14 MED ORDER — FENTANYL CITRATE (PF) 100 MCG/2ML IJ SOLN
INTRAMUSCULAR | Status: DC | PRN
  Administered 2016-08-14 (×2): 25 ug via INTRAVENOUS
  Administered 2016-08-14: 50 ug via INTRAVENOUS
  Administered 2016-08-14: 25 ug via INTRAVENOUS
  Administered 2016-08-14: 50 ug via INTRAVENOUS
  Administered 2016-08-14: 25 ug via INTRAVENOUS

## 2016-08-14 MED ORDER — PROMETHAZINE HCL 25 MG/ML IJ SOLN
INTRAMUSCULAR | Status: AC
  Filled 2016-08-14: qty 1

## 2016-08-14 MED ORDER — LACTATED RINGERS IR SOLN
Status: DC | PRN
  Administered 2016-08-14: 1000 mL

## 2016-08-14 MED ORDER — SUCCINYLCHOLINE CHLORIDE 200 MG/10ML IV SOSY
PREFILLED_SYRINGE | INTRAVENOUS | Status: AC
  Filled 2016-08-14: qty 10

## 2016-08-14 MED ORDER — EPHEDRINE 5 MG/ML INJ
INTRAVENOUS | Status: AC
  Filled 2016-08-14: qty 10

## 2016-08-14 MED ORDER — MORPHINE SULFATE (PF) 2 MG/ML IV SOLN
1.0000 mg | INTRAVENOUS | Status: DC | PRN
  Administered 2016-08-14 (×5): 2 mg via INTRAVENOUS
  Administered 2016-08-15 (×7): 4 mg via INTRAVENOUS
  Administered 2016-08-16 (×2): 2 mg via INTRAVENOUS
  Administered 2016-08-16: 4 mg via INTRAVENOUS
  Filled 2016-08-14: qty 2
  Filled 2016-08-14 (×2): qty 1
  Filled 2016-08-14 (×2): qty 2
  Filled 2016-08-14 (×2): qty 1
  Filled 2016-08-14: qty 2
  Filled 2016-08-14 (×3): qty 1
  Filled 2016-08-14 (×4): qty 2

## 2016-08-14 MED ORDER — PROPOFOL 10 MG/ML IV BOLUS
INTRAVENOUS | Status: DC | PRN
  Administered 2016-08-14: 50 mg via INTRAVENOUS
  Administered 2016-08-14: 100 mg via INTRAVENOUS

## 2016-08-14 MED ORDER — PROPOFOL 10 MG/ML IV BOLUS
INTRAVENOUS | Status: AC
  Filled 2016-08-14: qty 20

## 2016-08-14 MED ORDER — ONDANSETRON HCL 4 MG/2ML IJ SOLN
INTRAMUSCULAR | Status: DC | PRN
  Administered 2016-08-14: 4 mg via INTRAVENOUS

## 2016-08-14 MED ORDER — BUPIVACAINE HCL (PF) 0.25 % IJ SOLN
INTRAMUSCULAR | Status: DC | PRN
  Administered 2016-08-14: 20 mL

## 2016-08-14 MED ORDER — HYDROMORPHONE HCL 1 MG/ML IJ SOLN
INTRAMUSCULAR | Status: AC
  Filled 2016-08-14: qty 1

## 2016-08-14 MED ORDER — PHENYLEPHRINE 40 MCG/ML (10ML) SYRINGE FOR IV PUSH (FOR BLOOD PRESSURE SUPPORT)
PREFILLED_SYRINGE | INTRAVENOUS | Status: AC
  Filled 2016-08-14: qty 10

## 2016-08-14 MED ORDER — ONDANSETRON HCL 4 MG/2ML IJ SOLN
INTRAMUSCULAR | Status: AC
  Filled 2016-08-14: qty 2

## 2016-08-14 MED ORDER — LACTATED RINGERS IV SOLN: INTRAVENOUS | Status: DC

## 2016-08-14 MED ORDER — CEFOTETAN DISODIUM-DEXTROSE 2-2.08 GM-% IV SOLR
INTRAVENOUS | Status: AC
  Filled 2016-08-14: qty 50

## 2016-08-14 MED ORDER — 0.9 % SODIUM CHLORIDE (POUR BTL) OPTIME
TOPICAL | Status: DC | PRN
  Administered 2016-08-14: 5000 mL

## 2016-08-14 MED ORDER — SODIUM CHLORIDE 0.9 % IJ SOLN
INTRAMUSCULAR | Status: AC
  Filled 2016-08-14: qty 10

## 2016-08-14 MED ORDER — ONDANSETRON HCL 4 MG PO TABS: 4.0000 mg | ORAL_TABLET | Freq: Four times a day (QID) | ORAL | Status: DC | PRN

## 2016-08-14 MED ORDER — DEXTROSE 5 % IV SOLN
2.0000 g | INTRAVENOUS | Status: AC
  Administered 2016-08-14: 2 g via INTRAVENOUS

## 2016-08-14 MED ORDER — SUGAMMADEX SODIUM 200 MG/2ML IV SOLN
INTRAVENOUS | Status: AC
  Filled 2016-08-14: qty 2

## 2016-08-14 MED ORDER — CHLORHEXIDINE GLUCONATE 4 % EX LIQD: 60.0000 mL | Freq: Once | CUTANEOUS | Status: DC

## 2016-08-14 MED ORDER — ONDANSETRON HCL 4 MG/2ML IJ SOLN
4.0000 mg | Freq: Four times a day (QID) | INTRAMUSCULAR | Status: DC | PRN
  Administered 2016-08-14 – 2016-08-15 (×2): 4 mg via INTRAVENOUS
  Filled 2016-08-14 (×2): qty 2

## 2016-08-14 MED ORDER — ALVIMOPAN 12 MG PO CAPS
12.0000 mg | ORAL_CAPSULE | Freq: Two times a day (BID) | ORAL | Status: DC
  Administered 2016-08-15 – 2016-08-19 (×5): 12 mg via ORAL
  Filled 2016-08-14 (×8): qty 1

## 2016-08-14 MED ORDER — PROMETHAZINE HCL 25 MG/ML IJ SOLN
6.2500 mg | INTRAMUSCULAR | Status: AC | PRN
  Administered 2016-08-14 (×2): 6.25 mg via INTRAVENOUS

## 2016-08-14 MED ORDER — LIDOCAINE 2% (20 MG/ML) 5 ML SYRINGE
INTRAMUSCULAR | Status: DC | PRN
  Administered 2016-08-14: 80 mg via INTRAVENOUS

## 2016-08-14 MED ORDER — ALVIMOPAN 12 MG PO CAPS
12.0000 mg | ORAL_CAPSULE | Freq: Once | ORAL | Status: AC
  Administered 2016-08-14: 12 mg via ORAL
  Filled 2016-08-14: qty 1

## 2016-08-14 MED ORDER — ROCURONIUM BROMIDE 50 MG/5ML IV SOSY
PREFILLED_SYRINGE | INTRAVENOUS | Status: DC | PRN
  Administered 2016-08-14: 50 mg via INTRAVENOUS
  Administered 2016-08-14 (×2): 10 mg via INTRAVENOUS

## 2016-08-14 SURGICAL SUPPLY — 73 items
ADH SKN CLS APL DERMABOND .7 (GAUZE/BANDAGES/DRESSINGS) ×1
APPLIER CLIP 5 13 M/L LIGAMAX5 (MISCELLANEOUS)
APPLIER CLIP ROT 10 11.4 M/L (STAPLE)
APR CLP MED LRG 11.4X10 (STAPLE)
APR CLP MED LRG 5 ANG JAW (MISCELLANEOUS)
BLADE EXTENDED COATED 6.5IN (ELECTRODE) IMPLANT
CABLE HIGH FREQUENCY MONO STRZ (ELECTRODE) ×2 IMPLANT
CELLS DAT CNTRL 66122 CELL SVR (MISCELLANEOUS) IMPLANT
CLIP APPLIE 5 13 M/L LIGAMAX5 (MISCELLANEOUS) IMPLANT
CLIP APPLIE ROT 10 11.4 M/L (STAPLE) IMPLANT
COUNTER NEEDLE 20 DBL MAG RED (NEEDLE) ×2 IMPLANT
COVER MAYO STAND STRL (DRAPES) ×6 IMPLANT
COVER SURGICAL LIGHT HANDLE (MISCELLANEOUS) IMPLANT
DECANTER SPIKE VIAL GLASS SM (MISCELLANEOUS) ×1 IMPLANT
DERMABOND ADVANCED (GAUZE/BANDAGES/DRESSINGS) ×1
DERMABOND ADVANCED .7 DNX12 (GAUZE/BANDAGES/DRESSINGS) IMPLANT
DISSECTOR BLUNT TIP ENDO 5MM (MISCELLANEOUS) IMPLANT
DRAIN CHANNEL 19F RND (DRAIN) IMPLANT
DRAPE LAPAROSCOPIC ABDOMINAL (DRAPES) ×2 IMPLANT
DRSG OPSITE POSTOP 4X10 (GAUZE/BANDAGES/DRESSINGS) IMPLANT
DRSG OPSITE POSTOP 4X6 (GAUZE/BANDAGES/DRESSINGS) ×1 IMPLANT
DRSG OPSITE POSTOP 4X8 (GAUZE/BANDAGES/DRESSINGS) IMPLANT
ELECT PENCIL ROCKER SW 15FT (MISCELLANEOUS) ×4 IMPLANT
ELECT REM PT RETURN 15FT ADLT (MISCELLANEOUS) ×2 IMPLANT
EVACUATOR SILICONE 100CC (DRAIN) IMPLANT
EXTRT SYSTEM ALEXIS 17CM (MISCELLANEOUS)
GAUZE SPONGE 4X4 12PLY STRL (GAUZE/BANDAGES/DRESSINGS) IMPLANT
GLOVE SURG SIGNA 7.5 PF LTX (GLOVE) ×4 IMPLANT
GOWN STRL REUS W/TWL XL LVL3 (GOWN DISPOSABLE) ×8 IMPLANT
HOLDER FOLEY CATH W/STRAP (MISCELLANEOUS) ×2 IMPLANT
IRRIG SUCT STRYKERFLOW 2 WTIP (MISCELLANEOUS) ×2
IRRIGATION SUCT STRKRFLW 2 WTP (MISCELLANEOUS) ×1 IMPLANT
LEGGING LITHOTOMY PAIR STRL (DRAPES) ×2 IMPLANT
LUBRICANT JELLY K Y 4OZ (MISCELLANEOUS) IMPLANT
NDL HYPO 25X1 1.5 SAFETY (NEEDLE) ×1 IMPLANT
NEEDLE HYPO 25X1 1.5 SAFETY (NEEDLE) ×2 IMPLANT
PACK COLON (CUSTOM PROCEDURE TRAY) ×2 IMPLANT
PAD POSITIONING PINK XL (MISCELLANEOUS) ×2 IMPLANT
PORT LAP GEL ALEXIS MED 5-9CM (MISCELLANEOUS) ×1 IMPLANT
POSITIONER SURGICAL ARM (MISCELLANEOUS) IMPLANT
RETRACTOR WND ALEXIS 18 MED (MISCELLANEOUS) IMPLANT
RTRCTR WOUND ALEXIS 18CM MED (MISCELLANEOUS)
SEALER TISSUE X1 CVD JAW (INSTRUMENTS) IMPLANT
SHEARS HARMONIC ACE PLUS 36CM (ENDOMECHANICALS) ×1 IMPLANT
SLEEVE ADV FIXATION 5X100MM (TROCAR) ×4 IMPLANT
SPONGE LAP 18X18 X RAY DECT (DISPOSABLE) ×1 IMPLANT
STAPLER VISISTAT 35W (STAPLE) ×2 IMPLANT
SUT ETHILON 3 0 PS 1 (SUTURE) IMPLANT
SUT MNCRL AB 4-0 PS2 18 (SUTURE) ×3 IMPLANT
SUT NOVA NAB DX-16 0-1 5-0 T12 (SUTURE) IMPLANT
SUT PDS AB 1 CTX 36 (SUTURE) ×1 IMPLANT
SUT PDS AB 1 TP1 96 (SUTURE) IMPLANT
SUT PROLENE 2 0 KS (SUTURE) IMPLANT
SUT PROLENE 2 0 SH DA (SUTURE) IMPLANT
SUT SILK 2 0 (SUTURE) ×2
SUT SILK 2 0 SH (SUTURE) ×1 IMPLANT
SUT SILK 2 0 SH CR/8 (SUTURE) ×7 IMPLANT
SUT SILK 2-0 18XBRD TIE 12 (SUTURE) ×1 IMPLANT
SUT SILK 3 0 (SUTURE) ×2
SUT SILK 3 0 SH CR/8 (SUTURE) ×2 IMPLANT
SUT SILK 3-0 18XBRD TIE 12 (SUTURE) ×1 IMPLANT
SUT VIC AB 2-0 SH 27 (SUTURE) ×2
SUT VIC AB 2-0 SH 27X BRD (SUTURE) IMPLANT
SUT VIC AB 3-0 SH 18 (SUTURE) IMPLANT
SYSTEM CONTND EXTRCTN KII BLLN (MISCELLANEOUS) IMPLANT
TOWEL OR NON WOVEN STRL DISP B (DISPOSABLE) IMPLANT
TRAY FOLEY W/METER SILVER 16FR (SET/KITS/TRAYS/PACK) ×1 IMPLANT
TROCAR ADV FIXATION 11X100MM (TROCAR) ×1 IMPLANT
TROCAR ADV FIXATION 12X100MM (TROCAR) IMPLANT
TROCAR ADV FIXATION 5X100MM (TROCAR) ×2 IMPLANT
TROCAR BLADELESS OPT 5 100 (ENDOMECHANICALS) ×2 IMPLANT
TROCAR XCEL BLUNT TIP 100MML (ENDOMECHANICALS) IMPLANT
TUBING INSUF HEATED (TUBING) ×2 IMPLANT

## 2016-08-14 NOTE — Transfer of Care (Signed)
Immediate Anesthesia Transfer of Care Note  Patient: Derek [name]  Procedure(s) Performed: Procedure(s): LAPAROSCOPIC RESECTION TRANSVERSE COLON (N/A)  Patient Location: PACU  Anesthesia Type:General  Level of Consciousness: awake, alert , oriented and patient cooperative  Airway & Oxygen Therapy: Patient Spontanous Breathing and Patient connected to face mask oxygen  Post-op Assessment: Report given to RN and Post -op Vital signs reviewed and stable  Post vital signs: Reviewed and stable  Last Vitals:  Vitals:   08/14/16 0541  BP: 128/88  Pulse: 77  Resp: 16  Temp: 36.4 C    Last Pain:  Vitals:   08/14/16 0541  TempSrc: Oral         Complications: No apparent anesthesia complications

## 2016-08-14 NOTE — Op Note (Signed)
08/14/2016  2:09 PM  PATIENT:  Derek [name], 76 y.o., male, MRN: [id number]  PREOP DIAGNOSIS:  Dysplastic polyp of the left transverse colon  (Colonoscopy biopsy showed high grade dysplasia with focal area suspicious for superficial invasion)  POSTOP DIAGNOSIS:   Dysplastic polyp of the right transverse colon  PROCEDURE:   Procedure(s):  LAPAROSCOPIC exploration, RESECTION TRANSVERSE COLON,  Enterolysis of adhesions - 2 hours.  SURGEON:   [name], M.D.  Terrence DupontGeorgiann Cocker, M.D.  ANESTHESIA:   general  Anesthesiologist: Belinda Block, MD CRNA: Dione Booze, CRNA; Peggy D Williford, CRNA  General  EBL:  <100 cc  ml  BLOOD ADMINISTERED: none  DRAINS: none   LOCAL MEDICATIONS USED:   20 cc of Exparel + 20 cc 1/4% marcaine  SPECIMEN:   Transverse colon (suture distal margin)  COUNTS CORRECT:  YES  INDICATIONS FOR PROCEDURE:  Derek [name] is a 76 y.o. (DOB: July 06, 1941) white male whose primary care physician is Purvis Kilts, MD and comes for resection of transverse colon for a dysplastic polyp.   The indications and risks of the surgery were explained to the patient.  The risks include, but are not limited to, infection, bleeding, and nerve injury.  PROCEDURE:   The patient was taken to OR room #4 at [facility].  He was placed in the lithotomy position, had a Foley catheter in place, and his abdomen was prepped with ChloraPrep.   A timeout was held and surgical checklist run.   I accessed the abdominal cavity in the left upper quadrant with a 5 mm trocar. I placed 4 additional 5 mm trochars:  1 in the left lateral abdomen, one in the midline just above the umbilicus, one in the right lateral abdomen, and one in the right upper quadrant.   The patient had a prior ilio-cecectomy for Crohn's in the 1970s with significant complications. He had extensive adhesions in most of abdomen excluding the left upper quadrant. I spent 2 hours taking  down adhesions to the anterior abdominal wall, creating enough space to work in, exposing the transverse colon, and getting the transverse colon mass to midline for resection.  I used cold scissor and the harmonic for this dissection.   Dr. Carlean Purl marked the tumor with carbon black, he thought the tumor was in the left transverse colon, but I found the dye mark in the right transverse colon.  I was hoping to be able to take down the small bowel because of the patient's history of bowel obstructions, but these were densely adhered underneath the right lower quadrant abdominal scar. I thought mobilizing adhesions would add significant time and morbidity to the operation.   I was able to mobilize the transverse colon well enough to get the marked colon to the midline. At this point I thought had not damaged any small bowel, I created a large enough operative space in the upper abdominal cavity to see the colon well, and I mobilized the transverse colon enough to get access to the tumor.   I made a 9 cm upper midline abdominal incision to get to the transverse colon. I placed a wound protector.   I could feel an approximate 1.0 cm tumor at the site marked by Dr. Carlean Purl.  I then went about 6 cm proximal and 6 cm distal to the tumor/marked colon and resected a 14 cm segment of the transverse colon.  I took a wedge of 10 cm out of the transverse colon  mesentery.   I then did a handsewn anastomosis with 2-0 silk.  I used interrupted inverting sutures.  This created a 2 cm anastomosis and the bowel looked healthy.  I closed the mesentery with a 2-0 vicryl suture.   I irrigated the abdomen with 2 liters of saline.  I re-laparoscoped the patient and looked at the small bowel and the anastomosis.  I took photos of these.   I then changed instruments, redraped the wound, and changed gowns for closing the abdomen.  I closed the fascia with single stranded #1 PDS running suture. I removed all the trocars.  I closed  all skin wounds with 4-0 Monocryl and Dermabond.   The patient tolerated the procedure well.  I did leave an NGT.  He was transferred to the recovery room in good condition.  Sponge and needle count were correct at the end of the case.   I will place him in the step down unit because of his medical issues and the length of his surgery.  [name], MD, [facility], [facility] Pager: [phone] Office phone:  [phone]

## 2016-08-14 NOTE — Anesthesia Preprocedure Evaluation (Addendum)
Anesthesia Evaluation  Patient identified by MRN, date of birth, ID band Patient awake    Reviewed: Allergy & Precautions, NPO status , Patient's Chart, lab work & pertinent test results  Airway Mallampati: II  TM Distance: >3 FB     Dental   Pulmonary pneumonia, former smoker,    breath sounds clear to auscultation       Cardiovascular hypertension, + CAD and + Peripheral Vascular Disease   Rhythm:Regular Rate:Normal     Neuro/Psych    GI/Hepatic hiatal hernia, GERD  ,(+) Hepatitis -  Endo/Other    Renal/GU Renal disease     Musculoskeletal  (+) Arthritis ,   Abdominal   Peds  Hematology   Anesthesia Other Findings   Reproductive/Obstetrics                            Anesthesia Physical Anesthesia Plan  ASA: III  Anesthesia Plan: General   Post-op Pain Management:    Induction: Intravenous  Airway Management Planned: Oral ETT  Additional Equipment:   Intra-op Plan:   Post-operative Plan: Possible Post-op intubation/ventilation  Informed Consent: I have reviewed the patients History and Physical, chart, labs and discussed the procedure including the risks, benefits and alternatives for the proposed anesthesia with the patient or authorized representative who has indicated his/her understanding and acceptance.   Dental advisory given  Plan Discussed with: CRNA and Anesthesiologist  Anesthesia Plan Comments:         Anesthesia Quick Evaluation

## 2016-08-14 NOTE — Anesthesia Procedure Notes (Signed)
Procedure Name: Intubation Date/Time: 08/14/2016 7:38 AM Performed by: Dione Booze Pre-anesthesia Checklist: Emergency Drugs available, Suction available, Patient being monitored and Patient identified Patient Re-evaluated:Patient Re-evaluated prior to inductionOxygen Delivery Method: Circle system utilized Preoxygenation: Pre-oxygenation with 100% oxygen Ventilation: Mask ventilation without difficulty Laryngoscope Size: Mac and 4 Grade View: Grade I Tube type: Oral Tube size: 7.5 mm Number of attempts: 1 Airway Equipment and Method: Stylet Placement Confirmation: ETT inserted through vocal cords under direct vision,  positive ETCO2 and breath sounds checked- equal and bilateral Secured at: 22 cm Tube secured with: Tape Dental Injury: Teeth and Oropharynx as per pre-operative assessment

## 2016-08-14 NOTE — Anesthesia Postprocedure Evaluation (Signed)
Anesthesia Post Note  Patient: Derek [name]  Procedure(s) Performed: Procedure(s) (LRB): LAPAROSCOPIC RESECTION TRANSVERSE COLON (N/A)  Patient location during evaluation: PACU Anesthesia Type: General Level of consciousness: awake Pain management: pain level controlled Vital Signs Assessment: post-procedure vital signs reviewed and stable Respiratory status: spontaneous breathing Cardiovascular status: stable Postop Assessment: no headache Anesthetic complications: no       Last Vitals:  Vitals:   08/14/16 1316 08/14/16 1400  BP: 137/76 128/79  Pulse: 73 76  Resp: (!) 27 (!) 21  Temp:      Last Pain:  Vitals:   08/14/16 1409  TempSrc:   PainSc: Asleep                 Yancey Pedley

## 2016-08-14 NOTE — Interval H&P Note (Signed)
History and Physical Interval Note:  08/14/2016 7:16 AM  Derek [name]  has presented today for surgery, with the diagnosis of Adenoma transverse colon  The various methods of treatment have been discussed with the patient and family.  His wife and family are here.  After consideration of risks, benefits and other options for treatment, the patient has consented to  Procedure(s): LAPAROSCOPIC RESECTION TRANSVERSE COLON (N/A) as a surgical intervention .  The patient's history has been reviewed, patient examined, no change in status, stable for surgery.  I have reviewed the patient's chart and labs.  Questions were answered to the patient's satisfaction.     Derek [name] H

## 2016-08-15 ENCOUNTER — Encounter (HOSPITAL_COMMUNITY): Payer: Self-pay | Admitting: *Deleted

## 2016-08-15 DIAGNOSIS — I480 Paroxysmal atrial fibrillation: Secondary | ICD-10-CM

## 2016-08-15 LAB — CBC
HEMATOCRIT: 39 % (ref 39.0–52.0)
Hemoglobin: 12.8 g/dL — ABNORMAL LOW (ref 13.0–17.0)
MCH: 28.6 pg (ref 26.0–34.0)
MCHC: 32.8 g/dL (ref 30.0–36.0)
MCV: 87.2 fL (ref 78.0–100.0)
Platelets: 136 10*3/uL — ABNORMAL LOW (ref 150–400)
RBC: 4.47 MIL/uL (ref 4.22–5.81)
RDW: 13.6 % (ref 11.5–15.5)
WBC: 11.3 10*3/uL — ABNORMAL HIGH (ref 4.0–10.5)

## 2016-08-15 LAB — BASIC METABOLIC PANEL
ANION GAP: 9 (ref 5–15)
BUN: 16 mg/dL (ref 6–20)
CO2: 25 mmol/L (ref 22–32)
Calcium: 8.5 mg/dL — ABNORMAL LOW (ref 8.9–10.3)
Chloride: 101 mmol/L (ref 101–111)
Creatinine, Ser: 1.86 mg/dL — ABNORMAL HIGH (ref 0.61–1.24)
GFR, EST AFRICAN AMERICAN: 40 mL/min — AB (ref >60)
GFR, EST NON AFRICAN AMERICAN: 34 mL/min — AB (ref >60)
GLUCOSE: 137 mg/dL — AB (ref 65–99)
POTASSIUM: 5.2 mmol/L — AB (ref 3.5–5.1)
Sodium: 135 mmol/L (ref 135–145)

## 2016-08-15 MED ORDER — PHENOL 1.4 % MT LIQD
2.0000 | OROMUCOSAL | Status: DC | PRN
  Administered 2016-08-15: 2 via OROMUCOSAL
  Filled 2016-08-15: qty 177

## 2016-08-15 MED ORDER — DIAZEPAM 5 MG/ML IJ SOLN
5.0000 mg | Freq: Four times a day (QID) | INTRAMUSCULAR | Status: DC | PRN
  Administered 2016-08-15 – 2016-08-16 (×4): 5 mg via INTRAVENOUS
  Filled 2016-08-15 (×4): qty 2

## 2016-08-15 MED ORDER — DEXTROSE-NACL 5-0.45 % IV SOLN
INTRAVENOUS | Status: DC
  Administered 2016-08-15 – 2016-08-20 (×8): via INTRAVENOUS

## 2016-08-15 NOTE — Progress Notes (Signed)
1 Day Post-Op transverse colectomy Subjective: Had some anxiety last night.  Responded well to Valium.  Denies any pain currently  Objective: Vital signs in last 24 hours: Temp:  [97.7 F (36.5 C)-98.9 F (37.2 C)] 98.5 F (36.9 C) (01/13 0400) Pulse Rate:  [72-98] 79 (01/13 0700) Resp:  [17-35] 22 (01/13 0700) BP: (121-165)/(74-95) 160/81 (01/13 0700) SpO2:  [93 %-100 %] 99 % (01/13 0700) Weight:  [95.6 kg (210 lb 12.2 oz)] 95.6 kg (210 lb 12.2 oz) (01/12 1316)   Intake/Output from previous day: 01/12 0701 - 01/13 0700 In: 4706.7 [I.V.:4666.7; NG/GT:40] Out: 2000 [Urine:1450; Emesis/NG output:400; Blood:150] Intake/Output this shift: Total I/O In: 125 [I.V.:125] Out: 300 [Urine:300]   General appearance: alert and cooperative GI: soft, appropriately tender  Incision: no significant drainage  Lab Results:   Recent Labs  08/15/16 0327  WBC 11.3*  HGB 12.8*  HCT 39.0  PLT 136*   BMET  Recent Labs  08/14/16 0559 08/15/16 0327  NA  --  135  K 4.7 5.2*  CL  --  101  CO2  --  25  GLUCOSE  --  137*  BUN  --  16  CREATININE  --  1.86*  CALCIUM  --  8.5*   PT/INR No results for input(s): LABPROT, INR in the last 72 hours. ABG No results for input(s): PHART, HCO3 in the last 72 hours.  Invalid input(s): PCO2, PO2  MEDS, Scheduled . alvimopan  12 mg Oral BID  . heparin subcutaneous  5,000 Units Subcutaneous Q8H    Studies/Results: No results found.  Assessment: s/p Procedure(s): LAPAROSCOPIC RESECTION TRANSVERSE COLON Patient Active Problem List   Diagnosis Date Noted  . S/P colon resection 08/14/2016  . Adenomatous polyp of transverse colon 08/14/2016  . Infectious diarrhea   . Chronic diarrhea   . Essential hypertension 05/27/2016  . Hypotension 05/27/2016  . Hypomagnesemia 05/27/2016  . Peripheral neuropathic pain 05/26/2016  . Atrial fibrillation with RVR ([place]) 05/26/2016  . GERD (gastroesophageal reflux disease) 05/26/2016  . Atrial  fibrillation ([place]) 05/26/2016  . Lung nodule 05/12/2016  . SBO (small bowel obstruction) 05/11/2016  . Enteric hyperoxaluria ([place]) 02/21/2016  . Secondary hyperparathyroidism ([place]) 02/21/2016  . Pain in joint, shoulder region 10/12/2013  . Decreased range of motion of left shoulder 10/12/2013  . Muscle weakness (generalized) 10/12/2013  . Restriction of joint motion 10/12/2013  . S/P shoulder replacement 09/21/2013  . Cardiomyopathy ([place]) 09/07/2013  . Anxiety 08/22/2011  . CKD (chronic kidney disease) stage 3, GFR 30-59 ml/min 08/22/2011  . Thrombocytopenia ([place]) 08/22/2011  . CORONARY ATHEROSCLEROSIS NATIVE CORONARY ARTERY 06/25/2010  . Ascending aortic aneurysm ([place]) 06/26/2009  . Crohn's ileocolitis ([place]) 06/26/2009  . B12 DEFICIENCY 05/09/2008  . Gout 05/09/2008  . POST TRAUMATIC STRESS SYNDROME 05/09/2008  . GERD 05/09/2008  . HEPATITIS C Ab positive RNA neg, HX OF 05/09/2008  . PULMONARY EMBOLISM, HX OF 05/09/2008  . GASTRITIS, HX OF 05/09/2008    Expected post op course  Plan: Cont NG today.  Output is rather bilious OOB to chair.  Incentive spirometry Cont SDU for today    LOS: 1 day     .Rosario Adie, [facility], [place]   08/15/2016 8:20 AM

## 2016-08-15 NOTE — Consult Note (Signed)
Primary cardiologist: Dr Rowland Lathe Consulting cardiologist: Dr Carlyle Dolly Requesting physician: [name] Indication: afib  Clinical Summary Derek [name] is a 76 y.o.male with history of aortic aneurysm, mild to mod AI, sinus bradycardia, Crohn's disease, HTN, CKD 3, thrombocytopenia, LVEF 45-50%, and PAF. Regarding his PAF newly diagnosed 05/2016 preop colon surgery. S/p TEE/DCCV, started on amio and xarelto.   Admitted for dyplastic polyp of left transverse colon, s/p resection 08/14/16. Anticoagulation was held for surgery. Currently NPO s/p colon surgery with NG tube to suction.    Allergies  Allergen Reactions  . Lorazepam Other (See Comments)    Reaction:  Hallucinations   . Humira [Adalimumab] Other (See Comments)    Pt states that he got pancreatitis.      Medications Scheduled Medications: . alvimopan  12 mg Oral BID  . heparin subcutaneous  5,000 Units Subcutaneous Q8H     Infusions: . dextrose 5 % and 0.45 % NaCl with KCl 10 mEq/L 75 mL/hr at 08/15/16 0819     PRN Medications:  diazepam, HYDROcodone-acetaminophen, morphine injection, ondansetron **OR** ondansetron (ZOFRAN) IV   Past Medical History:  Diagnosis Date  . Anemia   . Anxiety   . Aortic insufficiency    a. mild-mod by echo 09/2015.  . Arthritis    "knees; left shoulder" (09/21/2013)  . Ascending aortic aneurysm (HCC)    a. last measurement 5.3 cm 03/2016 -> f/u planned 09/2015 to continue to follow.  . Atrial fibrillation ([place])   . B12 deficiency    takes Vit 12 shot every 14days   . Cataract   . CKD (chronic kidney disease) stage 3, GFR 30-59 ml/min 08/22/2011  . Clotting disorder ([place])   . Crohn's disease ([place])   . Depression   . Enlarged prostate   . Enteric hyperoxaluria ([place]) 02/21/2016  . GERD (gastroesophageal reflux disease)    takes Omeprazole daily  . Gout    takes Uloric and Colchicine daily  . Heart murmur   . Hepatitis C 1978   negtive RNA load - spontaneously  cleared  . Hiatal hernia   . History of blood transfusion 1978; 1990's; ?   "w/bowel resection; S/P allupurinol; ?" (09/21/2013)  . History of colon polyps   . History of kidney stones   . History of MRSA infection 2010  . History of pulmonary embolism 2006   both legs and both lungs /notes 08/26/2008 (09/21/2013)  . History of small bowel obstruction   . History of staph infection 1978  . Hyperoxaluria (HCC)    Intestinal  . Hypertension   . Insomnia    takes Trazodone nightly  . Internal hemorrhoids   . LV dysfunction    a. h/o EF 45-50% in 2015, normalized on subsequent echoes.  . Nephrolithiasis   . Pancreatitis 2010   elevated lipase and amylase, stranding in tail of pancreas, ? from Humira  . Pancytopenia    Hx of  . Peripheral neuropathy (HCC)    takes Gabapentin daily  . Pneumonia    hx of   . Post-traumatic stress syndrome    takes Paxil nightly  . PTSD (post-traumatic stress disorder)   . Pulmonary nodule    a. 54m by CT 05/2015, recommended f/u 6-12 months.  . RLS (restless legs syndrome)   . Rosacea conjunctivitis(372.31)    takes Minocin daily  . Secondary hyperparathyroidism ([place] 02/21/2016  . Sinus bradycardia   . Skin cancer    "cut/burned off left ear and face" (09/21/2013)  .  Small bowel obstruction   . Thrombocytopenia ([place])    hx of    Past Surgical History:  Procedure Laterality Date  . ANKLE SURGERY Right   . APPENDECTOMY  1978  . BOWEL RESECTION  1978 X 2  . CARDIOVERSION N/A 05/29/2016   Procedure: CARDIOVERSION;  Surgeon: Sanda Klein, MD;  Location: [facility];  Service: Cardiovascular;  Laterality: N/A;  . CHOLECYSTECTOMY    . COLON SURGERY    . COLONOSCOPY    . ESOPHAGOGASTRODUODENOSCOPY    . EYE SURGERY     cataract surgery bilateral  . FOOT SURGERY Right    "took gout out"  . HEMICOLECTOMY Right   . ILEOCECETOMY  1978   [name] 05/10/2000  (09/21/2013)  . INGUINAL HERNIA REPAIR Right   . KNEE ARTHROSCOPY Left   . LIGAMENT  REPAIR Left   . TEE WITHOUT CARDIOVERSION N/A 05/29/2016   Procedure: TRANSESOPHAGEAL ECHOCARDIOGRAM (TEE);  Surgeon: Sanda Klein, MD;  Location: [place];  Service: Cardiovascular;  Laterality: N/A;  . TOTAL SHOULDER ARTHROPLASTY Left 09/21/2013  . TOTAL SHOULDER ARTHROPLASTY Left 09/21/2013   Procedure: LEFT TOTAL SHOULDER ARTHROPLASTY;  Surgeon: Marin Shutter, MD;  Location: [place];  Service: Orthopedics;  Laterality: Left;    Family History  Problem Relation Age of Onset  . Kidney disease Father   . Hypertension Father   . Aneurysm Mother   . Aneurysm Sister   . Esophageal cancer Neg Hx   . Stomach cancer Neg Hx   . Rectal cancer Neg Hx     Social History Derek [name] reports that he quit smoking about 41 years ago. His smoking use included Cigarettes. He has a 40.00 pack-year smoking history. He quit smokeless tobacco use about 38 years ago. His smokeless tobacco use included Chew. Derek [name] reports that he does not drink alcohol.  Review of Systems CONSTITUTIONAL: No weight loss, fever, chills, weakness or fatigue.  HEENT: Eyes: No visual loss, blurred vision, double vision or yellow sclerae. No hearing loss, sneezing, congestion, runny nose or sore throat.  SKIN: No rash or itching.  CARDIOVASCULAR: No chest pain, chest pressure or chest discomfort. No palpitations or edema.  RESPIRATORY: No shortness of breath, cough or sputum.  GASTROINTESTINAL: No anorexia, nausea, vomiting or diarrhea. No abdominal pain or blood.  GENITOURINARY: no polyuria, no dysuria NEUROLOGICAL: No headache, dizziness, syncope, paralysis, ataxia, numbness or tingling in the extremities. No change in bowel or bladder control.  MUSCULOSKELETAL: No muscle, back pain, joint pain or stiffness.  HEMATOLOGIC: No anemia, bleeding or bruising.  LYMPHATICS: No enlarged nodes. No history of splenectomy.  PSYCHIATRIC: No history of depression or anxiety.      Physical Examination Blood pressure  (!) 164/81, pulse 79, temperature 98.5 F (36.9 C), temperature source Oral, resp. rate 19, height 6' (1.829 m), weight 210 lb 12.2 oz (95.6 kg), SpO2 99 %.  Intake/Output Summary (Last 24 hours) at 08/15/16 0908 Last data filed at 08/15/16 0800  Gross per 24 hour  Intake          3831.67 ml  Output             2300 ml  Net          1531.67 ml   General: NAD  HEENT: sclera clear, throat clear  Cardiovascular: RRR, no m/r/g, no jvd  Respiratory: CTAB  GI: abdomen soft, NT, ND  MSK: nO LE edema  Neuro: no focal deficits  Psych: appropriate affect   Lab Results  Basic  Metabolic Panel:  Recent Labs Lab 08/10/16 0910 08/14/16 0559 08/15/16 0327  NA 136  --  135  K 7.1* 4.7 5.2*  CL 101  --  101  CO2 27  --  25  GLUCOSE 105*  --  137*  BUN 22*  --  16  CREATININE 2.03*  --  1.86*  CALCIUM 9.7  --  8.5*    Liver Function Tests:  Recent Labs Lab 08/10/16 0910  AST 25  ALT 27  ALKPHOS 132*  BILITOT 1.1  PROT 6.6  ALBUMIN 4.0    CBC:  Recent Labs Lab 08/10/16 0910 08/15/16 0327  WBC 5.8 11.3*  NEUTROABS 4.3  --   HGB 14.4 12.8*  HCT 44.6 39.0  MCV 89.9 87.2  PLT 118* 136*    Cardiac Enzymes: No results for input(s): CKTOTAL, CKMB, CKMBINDEX, TROPONINI in the last 168 hours.  BNP: Invalid input(s): POCBNP     Impression/Recommendations 1. PAF - patient has maintained NSR s/p prior TEE/DCCV, has been on home amiodarone - NPO after surgery. He remains in NSR. Amio with very long half life. Do not see strong indication to start IV amio drip at this time. If he were to revert back to afib could start IV at that time - CHADS2Vasc score is 2, anticoag on hold s/p surgery. Resume when ok from surgical standpoint. - we will follow telemetry tomorrow.    Carlyle Dolly, M.D.

## 2016-08-16 DIAGNOSIS — D123 Benign neoplasm of transverse colon: Secondary | ICD-10-CM

## 2016-08-16 LAB — CBC WITH DIFFERENTIAL/PLATELET
Basophils Absolute: 0 10*3/uL (ref 0.0–0.1)
Basophils Relative: 0 %
Eosinophils Absolute: 0 10*3/uL (ref 0.0–0.7)
Eosinophils Relative: 0 %
HCT: 39.7 % (ref 39.0–52.0)
Hemoglobin: 13 g/dL (ref 13.0–17.0)
LYMPHS ABS: 0.7 10*3/uL (ref 0.7–4.0)
LYMPHS PCT: 7 %
MCH: 28.7 pg (ref 26.0–34.0)
MCHC: 32.7 g/dL (ref 30.0–36.0)
MCV: 87.6 fL (ref 78.0–100.0)
MONO ABS: 0.8 10*3/uL (ref 0.1–1.0)
MONOS PCT: 7 %
Neutro Abs: 9.2 10*3/uL — ABNORMAL HIGH (ref 1.7–7.7)
Neutrophils Relative %: 86 %
Platelets: 129 10*3/uL — ABNORMAL LOW (ref 150–400)
RBC: 4.53 MIL/uL (ref 4.22–5.81)
RDW: 13.9 % (ref 11.5–15.5)
WBC: 10.7 10*3/uL — ABNORMAL HIGH (ref 4.0–10.5)

## 2016-08-16 LAB — BASIC METABOLIC PANEL
ANION GAP: 8 (ref 5–15)
BUN: 14 mg/dL (ref 6–20)
CALCIUM: 8.8 mg/dL — AB (ref 8.9–10.3)
CO2: 28 mmol/L (ref 22–32)
CREATININE: 1.58 mg/dL — AB (ref 0.61–1.24)
Chloride: 97 mmol/L — ABNORMAL LOW (ref 101–111)
GFR calc Af Amer: 48 mL/min — ABNORMAL LOW (ref >60)
GFR calc non Af Amer: 42 mL/min — ABNORMAL LOW (ref >60)
GLUCOSE: 121 mg/dL — AB (ref 65–99)
Potassium: 4.6 mmol/L (ref 3.5–5.1)
Sodium: 133 mmol/L — ABNORMAL LOW (ref 135–145)

## 2016-08-16 MED ORDER — DIAZEPAM 5 MG/ML IJ SOLN
5.0000 mg | Freq: Three times a day (TID) | INTRAMUSCULAR | Status: DC | PRN
Start: 2016-08-16 — End: 2016-08-20
  Administered 2016-08-16 – 2016-08-18 (×3): 5 mg via INTRAVENOUS
  Filled 2016-08-16 (×6): qty 2

## 2016-08-16 MED ORDER — MORPHINE SULFATE (PF) 2 MG/ML IV SOLN
1.0000 mg | INTRAVENOUS | Status: DC | PRN
  Administered 2016-08-16 – 2016-08-17 (×4): 4 mg via INTRAVENOUS
  Administered 2016-08-17: 2 mg via INTRAVENOUS
  Administered 2016-08-17: 4 mg via INTRAVENOUS
  Administered 2016-08-17: 2 mg via INTRAVENOUS
  Administered 2016-08-17 – 2016-08-18 (×4): 4 mg via INTRAVENOUS
  Administered 2016-08-21: 2 mg via INTRAVENOUS
  Filled 2016-08-16 (×8): qty 2
  Filled 2016-08-16: qty 1
  Filled 2016-08-16: qty 2
  Filled 2016-08-16 (×2): qty 1

## 2016-08-16 NOTE — Progress Notes (Signed)
Patient remains in NSR. Please see original consult note from yesterday. No new cardiology recs at this time   Zandra Abts MD

## 2016-08-16 NOTE — Progress Notes (Signed)
Patient received from RN Amy (ICU) to Room 1433. Cardiac monitor paced and VSS. Patient oriented to room and call bell. Questions answered. Agree with RN Amy's Shift Assessment. Will monitor closely.

## 2016-08-16 NOTE — Progress Notes (Signed)
2 Days Post-Op segmental colectomy Subjective:  Denies any pain currently  Objective: Vital signs in last 24 hours: Temp:  [97.2 F (36.2 C)-99 F (37.2 C)] 98.9 F (37.2 C) (01/14 0400) Pulse Rate:  [76-93] 89 (01/14 0700) Resp:  [19-36] 30 (01/14 0700) BP: (146-167)/(78-98) 150/91 (01/14 0700) SpO2:  [94 %-100 %] 98 % (01/14 0700) Weight:  [91.3 kg (201 lb 4.5 oz)] 91.3 kg (201 lb 4.5 oz) (01/14 0000)   Intake/Output from previous day: 01/13 0701 - 01/14 0700 In: 1880 [I.V.:1850; NG/GT:30] Out: 4025 [Urine:3375; Emesis/NG output:650] Intake/Output this shift: No intake/output data recorded.   General appearance: alert and cooperative GI: soft, appropriately tender  Incision: no significant drainage  Lab Results:   Recent Labs  08/15/16 0327 08/16/16 0302  WBC 11.3* 10.7*  HGB 12.8* 13.0  HCT 39.0 39.7  PLT 136* 129*   BMET  Recent Labs  08/15/16 0327 08/16/16 0302  NA 135 133*  K 5.2* 4.6  CL 101 97*  CO2 25 28  GLUCOSE 137* 121*  BUN 16 14  CREATININE 1.86* 1.58*  CALCIUM 8.5* 8.8*   PT/INR No results for input(s): LABPROT, INR in the last 72 hours. ABG No results for input(s): PHART, HCO3 in the last 72 hours.  Invalid input(s): PCO2, PO2  MEDS, Scheduled . alvimopan  12 mg Oral BID  . heparin subcutaneous  5,000 Units Subcutaneous Q8H    Studies/Results: No results found.  Assessment: s/p Procedure(s): LAPAROSCOPIC RESECTION TRANSVERSE COLON Patient Active Problem List   Diagnosis Date Noted  . S/P colon resection 08/14/2016  . Adenomatous polyp of transverse colon s/p partial colectomy 08/14/2016 08/14/2016  . Infectious diarrhea   . Chronic diarrhea   . Essential hypertension 05/27/2016  . Hypotension 05/27/2016  . Hypomagnesemia 05/27/2016  . Peripheral neuropathic pain 05/26/2016  . Atrial fibrillation with RVR ([place]) 05/26/2016  . GERD (gastroesophageal reflux disease) 05/26/2016  . Atrial fibrillation ([place]) 05/26/2016  .  Lung nodule 05/12/2016  . SBO (small bowel obstruction) 05/11/2016  . Enteric hyperoxaluria ([place]) 02/21/2016  . Secondary hyperparathyroidism ([place]) 02/21/2016  . Pain in joint, shoulder region 10/12/2013  . Decreased range of motion of left shoulder 10/12/2013  . Muscle weakness (generalized) 10/12/2013  . Restriction of joint motion 10/12/2013  . S/P shoulder replacement 09/21/2013  . Cardiomyopathy ([place]) 09/07/2013  . Anxiety 08/22/2011  . CKD (chronic kidney disease) stage 3, GFR 30-59 ml/min 08/22/2011  . Thrombocytopenia ([place]) 08/22/2011  . CORONARY ATHEROSCLEROSIS NATIVE CORONARY ARTERY 06/25/2010  . Ascending aortic aneurysm ([place]) 06/26/2009  . Crohn's ileocolitis ([place]) 06/26/2009  . B12 DEFICIENCY 05/09/2008  . Gout 05/09/2008  . POST TRAUMATIC STRESS SYNDROME 05/09/2008  . GERD 05/09/2008  . HEPATITIS C Ab positive RNA neg, HX OF 05/09/2008  . PULMONARY EMBOLISM, HX OF 05/09/2008  . GASTRITIS, HX OF 05/09/2008    Expected post op course  Plan: Cont NG today.  Output is rather bilious OOB to chair.  Incentive spirometry Transfer to tele bed    LOS: 2 days     .Rosario Adie, [facility], [place]   08/16/2016 7:53 AM

## 2016-08-17 ENCOUNTER — Encounter (HOSPITAL_COMMUNITY): Payer: Self-pay | Admitting: Surgery

## 2016-08-17 DIAGNOSIS — R001 Bradycardia, unspecified: Secondary | ICD-10-CM | POA: Diagnosis not present

## 2016-08-17 LAB — TROPONIN I: Troponin I: <0.03 ng/mL (ref <0.03)

## 2016-08-17 MED ORDER — METHYLPREDNISOLONE SODIUM SUCC 40 MG IJ SOLR
20.0000 mg | Freq: Two times a day (BID) | INTRAMUSCULAR | Status: DC
  Administered 2016-08-17 – 2016-08-18 (×3): 20 mg via INTRAVENOUS
  Filled 2016-08-17 (×3): qty 1

## 2016-08-17 MED ORDER — DICLOFENAC SODIUM 1 % TD GEL
4.0000 g | Freq: Two times a day (BID) | TRANSDERMAL | Status: DC | PRN
  Filled 2016-08-17: qty 100

## 2016-08-17 NOTE — Progress Notes (Signed)
Patient Name: Derek [name] Date of Encounter: 08/17/2016  Primary Cardiologist: [facility] Problem List     Principal Problem:   Adenomatous polyp of transverse colon s/p partial colectomy 08/14/2016 Active Problems:   Ascending aortic aneurysm (HCC)   CKD (chronic kidney disease) stage 3, GFR 30-59 ml/min   Cardiomyopathy (HCC)   PAF (paroxysmal atrial fibrillation) (HCC)   Essential hypertension   S/P colon resection   Sinus bradycardia    Subjective   No chest pain or SOB. C/o gout in left knee, going to be treated with steroids. He thinks they may let him eat tomorrow.  Inpatient Medications    . alvimopan  12 mg Oral BID  . heparin subcutaneous  5,000 Units Subcutaneous Q8H  . methylPREDNISolone (SOLU-MEDROL) injection  20 mg Intravenous Q12H    Vital Signs    Vitals:   08/16/16 1111 08/16/16 1435 08/16/16 2158 08/17/16 0507  BP: 139/89 (!) 148/96 (!) 152/94 128/82  Pulse:  91 94 92  Resp:  18 18 (!) 40  Temp:  98.3 F (36.8 C) 97.6 F (36.4 C) 97.3 F (36.3 C)  TempSrc:  Oral Oral Oral  SpO2:  95% 94% 98%  Weight:      Height:        Intake/Output Summary (Last 24 hours) at 08/17/16 0919 Last data filed at 08/17/16 0917  Gross per 24 hour  Intake             1140 ml  Output              550 ml  Net              590 ml   Filed Weights   08/14/16 1316 08/16/16 0000  Weight: 210 lb 12.2 oz (95.6 kg) 201 lb 4.5 oz (91.3 kg)    Physical Exam    General: Well developed, well nourished WM in no acute distress. Lying flat in bed HEENT: Normocephalic, atraumatic, sclera non-icteric, no xanthomas, nares are without discharge. Neck: Negative for carotid bruits. JVP not elevated. Lungs: Clear bilaterally to auscultation without wheezes, rales, or rhonchi. Breathing is unlabored. Cardiac: RRR S1 S2 without murmurs, rubs, or gallops.  Abdomen: Soft, non-tender, non-distended with normoactive bowel sounds. No rebound/guarding. Extremities: No  clubbing or cyanosis. No edema except mild swelling left knee. Distal pedal pulses are 2+ and equal bilaterally. Skin: Warm and dry, no significant rash. Neuro: Alert and oriented X 3. Sensation in tact. Follows commands. Psych:  Responds to questions appropriately with a normal affect.  Labs    CBC  Recent Labs  08/15/16 0327 08/16/16 0302  WBC 11.3* 10.7*  NEUTROABS  --  9.2*  HGB 12.8* 13.0  HCT 39.0 39.7  MCV 87.2 87.6  PLT 136* 381*   Basic Metabolic Panel  Recent Labs  08/15/16 0327 08/16/16 0302  NA 135 133*  K 5.2* 4.6  CL 101 97*  CO2 25 28  GLUCOSE 137* 121*  BUN 16 14  CREATININE 1.86* 1.58*  CALCIUM 8.5* 8.8*    Telemetry    NSR  Radiology    No results found.   Patient Profile     61M with aortic aneurysm (5.3cm dilated aortic root & 4.3cm ascending thoracic aorta 03/2016 followed by Dr. Servando Snare,) , mild to mod AI, sinus bradycardia (baseline HR previously 50s), Crohn's disease, HTN, CKD 3, thrombocytopenia, normal nuc 09/2013, LVEF 45-50% 05/2016 (felt possibly due to atrial fib), and PAF (diagnosed pre-op 05/2016 s/p  TEE/DCCV) admitted for dyplastic polyp of left transverse colon, s/p resection 08/14/16. It appears that BB was stopped due to hypotension in 05/2016.   Assessment & Plan    1. Dysplastic colon s/p resection - per primary team. Surgical path pending. Please resume anticoagulation when safe from surgical standpoint.  2. History of paroxysmal atrial fib - maintaining NSR, remains NPO. Patient thinks he may be able to eat tomorrow. Amiodarone has long half life so he is likely still reaping benefits from chronic use, but will review with MD the timeframe at which we may need to consider IV form if he remains NPO. Note in 05/2016 he did not tolerate AF well and had developed hypotension. Could consider low dose IV beta blocker in the interim. His baseline HR is usually in the 50s-60s but he has been in the 80s-90s here.  3. Cardiomyopathy  - previously felt possibly 2/2 atrial fib. Euvolemic on exam. Recommend to lock IV fluids as soon as primary team feels safe. Will write to follow I/O's and daily weights.  4. HTN - BP variable. If this remains elevated may need rx.  Signed, Charlie Pitter, PA-C  08/17/2016, 9:19 AM  As above, patient seen and examined. Patient denies chest pain or dyspnea. Abdominal pain improving. He remains in sinus rhythm. Once he is tolerating oral medications would resume amiodarone 200 mg daily. Once okay from a surgical standpoint would begin apixaban 5 mg BID. Kirk Ruths, MD

## 2016-08-17 NOTE — Progress Notes (Signed)
[place] [facility] Office:  [phone] General Surgery Progress Note   LOS: 3 days  POD -  3 Days Post-Op  Assessment/Plan: 1. LAPAROSCOPIC exploration, RESECTION TRANSVERSE COLON,  Enterolysis of adhesions - 2 hour  Has BS - will try NGT out - keep NPO  2.  Gout involving left knee  Will try steroids IV, this has worked for him before  3.  ATRIAL FIBRILLATION, CONTROLLED (I48.91)  Followed by Dr. Aundra Dubin for cardiology  Seen by Dr. Harl Bowie in the hospital 4.  ASCENDING AORTIC ANEURYSM (I71.2)             Impression: Followed by Dr. Servando Snare  Currently root is 5.1 cm - consider replacement at 5.5 cm  5. History of Crohn's  Prior ileocecectomy around 1978. He required a second operation. He had a fistula that took months to heal. He did not have straight forward surgery.  He has has some prior SBO secondary to adhesions from that surgery  6. History of PE 7. History of Hep C - from blood transfusion during his bowel resection in the 1970's 8. Nephrolithiasis  9. Post tramatic stress syndrome - from service  Takes Paxil nightly  Will use valium for now 10. Chronic renal insufficiency  Creatinine - 1.58 - 08/16/2016 Seen by Dr. Lenna Sciara. Deterding q 3 months 11. DVT prophylaxis - SQ Heparin   Principal Problem:   Adenomatous polyp of transverse colon s/p partial colectomy 08/14/2016 Active Problems:   S/P colon resection  Subjective:  Miserable, but more for the knee than the abdomen.  No flatus or BM.  Wife in room  Objective:   Vitals:   08/16/16 2158 08/17/16 0507  BP: (!) 152/94 128/82  Pulse: 94 92  Resp: 18 (!) 40  Temp: 97.6 F (36.4 C) 97.3 F (36.3 C)     Intake/Output from previous day:  01/14 0701 - 01/15 0700 In: 1270 [I.V.:1150; NG/GT:120] Out: 550 [Emesis/NG output:550]  Intake/Output this shift:  No intake/output data recorded.   Physical Exam:   General: Older WM who is alert and oriented.    HEENT:  Normal. Pupils equal. .   Lungs: Clear. IS = 1,400 cc   Abdomen: Soft.  Has BS.   Wound: Clean   Extremities - pain and swelling in the left knee   Lab Results:    Recent Labs  08/15/16 0327 08/16/16 0302  WBC 11.3* 10.7*  HGB 12.8* 13.0  HCT 39.0 39.7  PLT 136* 129*    BMET   Recent Labs  08/15/16 0327 08/16/16 0302  NA 135 133*  K 5.2* 4.6  CL 101 97*  CO2 25 28  GLUCOSE 137* 121*  BUN 16 14  CREATININE 1.86* 1.58*  CALCIUM 8.5* 8.8*    PT/INR  No results for input(s): LABPROT, INR in the last 72 hours.  ABG  No results for input(s): PHART, HCO3 in the last 72 hours.  Invalid input(s): PCO2, PO2   Studies/Results:  No results found.   Anti-infectives:   Anti-infectives    Start     Dose/Rate Route Frequency Ordered Stop   08/14/16 0540  cefoTEtan (CEFOTAN) 2 g in dextrose 5 % 50 mL IVPB     2 g 100 mL/hr over 30 Minutes Intravenous On call to O.R. 08/14/16 0540 08/14/16 0750      [name], MD, FACS Pager: [place] [facility] Office: [phone] 08/17/2016

## 2016-08-18 DIAGNOSIS — Z7901 Long term (current) use of anticoagulants: Secondary | ICD-10-CM

## 2016-08-18 LAB — TROPONIN I
Troponin I: <0.03 ng/mL (ref <0.03)
Troponin I: <0.03 ng/mL (ref <0.03)

## 2016-08-18 LAB — BASIC METABOLIC PANEL
Anion gap: 10 (ref 5–15)
BUN: 27 mg/dL — AB (ref 6–20)
CO2: 27 mmol/L (ref 22–32)
Calcium: 8.8 mg/dL — ABNORMAL LOW (ref 8.9–10.3)
Chloride: 96 mmol/L — ABNORMAL LOW (ref 101–111)
Creatinine, Ser: 1.44 mg/dL — ABNORMAL HIGH (ref 0.61–1.24)
GFR calc Af Amer: 54 mL/min — ABNORMAL LOW (ref >60)
GFR, EST NON AFRICAN AMERICAN: 47 mL/min — AB (ref >60)
Glucose, Bld: 157 mg/dL — ABNORMAL HIGH (ref 65–99)
POTASSIUM: 4 mmol/L (ref 3.5–5.1)
SODIUM: 133 mmol/L — AB (ref 135–145)

## 2016-08-18 LAB — MAGNESIUM: MAGNESIUM: 1.9 mg/dL (ref 1.7–2.4)

## 2016-08-18 MED ORDER — AMIODARONE HCL 200 MG PO TABS
200.0000 mg | ORAL_TABLET | Freq: Every day | ORAL | Status: DC
  Administered 2016-08-18 – 2016-08-21 (×4): 200 mg via ORAL
  Filled 2016-08-18 (×4): qty 1

## 2016-08-18 MED ORDER — METHYLPREDNISOLONE SODIUM SUCC 40 MG IJ SOLR
10.0000 mg | Freq: Two times a day (BID) | INTRAMUSCULAR | Status: DC
  Administered 2016-08-18: 10 mg via INTRAVENOUS
  Filled 2016-08-18 (×2): qty 1

## 2016-08-18 NOTE — Progress Notes (Signed)
Patient was complaining of right sided chest pain 6/10. States it is sharp pain when he takes in deep breathes. This started right after he ambulated. Vitals were WNL. EKG was NSR. 58m IV morphine was given and once given, pain started to subside. On call cardiology was notified and new orders were given for troponin checks. Patient asleep 30 minutes after morphine was given.

## 2016-08-18 NOTE — Progress Notes (Signed)
[place] [facility] Office:  [phone] General Surgery Progress Note   LOS: 4 days  POD -  4 Days Post-Op  Assessment/Plan: 1. LAPAROSCOPIC exploration, RESECTION TRANSVERSE COLON,  Enterolysis of adhesions - 2 hour  Had a couple of BM's - will start clear liquids  If PO's tolerated today, will restart oral meds tomorrow.   2.  Gout involving left knee  Steroids seemed to have helped, will start taper  3.  ATRIAL FIBRILLATION, CONTROLLED (I48.91)  Followed by Dr. Aundra Dubin for cardiology  Seen by Dr. Harl Bowie in the hospital  Chest pain yesterday - better today - Troponins have been normal 4.  ASCENDING AORTIC ANEURYSM (I71.2)             Impression: Followed by Dr. Servando Snare  Currently root is 5.1 cm - consider replacement at 5.5 cm  5. History of Crohn's  Prior ileocecectomy around 1978. He required a second operation. He had a fistula that took months to heal. He did not have straight forward surgery.  He has has some prior SBO secondary to adhesions from that surgery  6. History of PE 7. History of Hep C - from blood transfusion during his bowel resection in the 1970's 8. Nephrolithiasis  9. Post tramatic stress syndrome - from service  Takes Paxil nightly  Will use valium for now 10. Chronic renal insufficiency  Creatinine - 1.44 - 08/18/2016 Seen by Dr. Lenna Sciara. Deterding q 3 months 11. DVT prophylaxis - SQ Heparin   Principal Problem:   Adenomatous polyp of transverse colon s/p partial colectomy 08/14/2016 Active Problems:   Ascending aortic aneurysm (HCC)   CKD (chronic kidney disease) stage 3, GFR 30-59 ml/min   Cardiomyopathy (HCC)   PAF (paroxysmal atrial fibrillation) (HCC)   Essential hypertension   S/P colon resection   Sinus bradycardia  Subjective:  Looks much better today.  Had 2 small BM's.  Left knee pain better.  Able to walk.  Objective:   Vitals:   08/17/16 2253 08/18/16 0554  BP: 130/75 124/82  Pulse:  85 79  Resp: (!) 24 20  Temp:  98.2 F (36.8 C)     Intake/Output from previous day:  01/15 0701 - 01/16 0700 In: 1832.5 [I.V.:1832.5] Out: -   Intake/Output this shift:  No intake/output data recorded.   Physical Exam:   General: Older WM who is alert and oriented.    HEENT: Normal. Pupils equal. .   Lungs: Clear.    Abdomen: Soft.  Has a few BS.   Wound: Clean   Extremities - Less left knee   Lab Results:     Recent Labs  08/16/16 0302  WBC 10.7*  HGB 13.0  HCT 39.7  PLT 129*    BMET    Recent Labs  08/16/16 0302 08/18/16 0541  NA 133* 133*  K 4.6 4.0  CL 97* 96*  CO2 28 27  GLUCOSE 121* 157*  BUN 14 27*  CREATININE 1.58* 1.44*  CALCIUM 8.8* 8.8*    PT/INR  No results for input(s): LABPROT, INR in the last 72 hours.  ABG  No results for input(s): PHART, HCO3 in the last 72 hours.  Invalid input(s): PCO2, PO2   Studies/Results:  No results found.   Anti-infectives:   Anti-infectives    Start     Dose/Rate Route Frequency Ordered Stop   08/14/16 0540  cefoTEtan (CEFOTAN) 2 g in dextrose 5 % 50 mL IVPB     2 g 100 mL/hr over 30 Minutes  Intravenous On call to O.R. 08/14/16 0540 08/14/16 0750      [name], MD, FACS Pager: [phone] Surgery Office: [phone] 08/18/2016

## 2016-08-18 NOTE — Progress Notes (Signed)
Gave patient 36m of IV valium. Wasted the other 512mwith JuThedora HindersRN.

## 2016-08-18 NOTE — Progress Notes (Signed)
Gave patient 1m of IV valium. Wasted the other 5100mwith ChVirgina NorfolkRN.

## 2016-08-18 NOTE — Care Management Important Message (Signed)
Important Message  Patient Details  Name: Derek [name] MRN: [id number] Date of Birth: [birth year]   Medicare Important Message Given:  Yes    Kerin Salen 08/18/2016, 11:28 [place] Message  Patient Details  Name: Derek [name] MRN: [id number] Date of Birth: 05-23-1941   Medicare Important Message Given:  Yes    Kerin Salen 08/18/2016, 11:28 AM

## 2016-08-18 NOTE — Progress Notes (Signed)
Patient Name: Derek [name] Date of Encounter: 08/18/2016  Primary Cardiologist: [facility] Problem List     Principal Problem:   Adenomatous polyp of transverse colon s/p partial colectomy 08/14/2016 Active Problems:   Ascending aortic aneurysm (HCC)   CKD (chronic kidney disease) stage 3, GFR 30-59 ml/min   Cardiomyopathy (HCC)   PAF (paroxysmal atrial fibrillation) (HCC)   Essential hypertension   S/P colon resection   Sinus bradycardia   Chronic anticoagulation    Subjective   No chest pain or SOB. Still NPO except clear liquids  Inpatient Medications    . alvimopan  12 mg Oral BID  . heparin subcutaneous  5,000 Units Subcutaneous Q8H  . methylPREDNISolone (SOLU-MEDROL) injection  10 mg Intravenous Q12H    Vital Signs    Vitals:   08/17/16 1400 08/17/16 2222 08/17/16 2253 08/18/16 0554  BP: 136/86 123/86 130/75 124/82  Pulse: 93 86 85 79  Resp: 20 20 (!) 24 20  Temp: 99.3 F (37.4 C) 98.7 F (37.1 C)  98.2 F (36.8 C)  TempSrc: Oral Oral  Oral  SpO2: 96% 94% 96% 95%  Weight:    195 lb 15.8 oz (88.9 kg)  Height:        Intake/Output Summary (Last 24 hours) at 08/18/16 1145 Last data filed at 08/18/16 1115  Gross per 24 hour  Intake             1960 ml  Output                0 ml  Net             1960 ml   Filed Weights   08/16/16 0000 08/17/16 1015 08/18/16 0554  Weight: 201 lb 4.5 oz (91.3 kg) 204 lb 9.4 oz (92.8 kg) 195 lb 15.8 oz (88.9 kg)    Physical Exam    General: Well developed, well nourished WM in no acute distress. Lying flat in bed HEENT: Normocephalic, atraumatic, sclera non-icteric, no xanthomas, nares are without discharge. Neck: Negative for carotid bruits. JVP not elevated. Lungs: Clear bilaterally to auscultation without wheezes, rales, or rhonchi. Breathing is unlabored. Cardiac: RRR S1 S2 without murmurs, rubs, or gallops.  Abdomen: Surgical wound dry and intact, BS diminnished Extremities: No clubbing or  cyanosis. No edema except mild swelling left knee. Distal pedal pulses are 2+ and equal bilaterally. Skin: Warm and dry, no significant rash. Neuro: Alert and oriented X 3. Sensation in tact. Follows commands. Psych:  Responds to questions appropriately with a normal affect.  Labs    CBC  Recent Labs  08/16/16 0302  WBC 10.7*  NEUTROABS 9.2*  HGB 13.0  HCT 39.7  MCV 87.6  PLT 053*   Basic Metabolic Panel  Recent Labs  08/16/16 0302 08/18/16 0541  NA 133* 133*  K 4.6 4.0  CL 97* 96*  CO2 28 27  GLUCOSE 121* 157*  BUN 14 27*  CREATININE 1.58* 1.44*  CALCIUM 8.8* 8.8*  MG  --  1.9    Telemetry    NSR-10 bt run of PAF yesterday afternoon, NSR now  Radiology    No results found.   Patient Profile     25M with aortic aneurysm (5.3cm dilated aortic root & 4.3cm ascending thoracic aorta 03/2016 followed by Dr. Servando [name],) , mild to mod AI, sinus bradycardia (baseline HR previously 50s), Crohn's disease, HTN, CKD 3, thrombocytopenia, normal nuc 09/2013, LVEF 45-50% 05/2016 (felt possibly due to atrial fib), and PAF (  diagnosed pre-op 05/2016 s/p TEE/DCCV) admitted for dyplastic polyp of left transverse colon, s/p resection 08/14/16. It appears that BB was stopped due to hypotension in 05/2016.   Assessment & Plan    1. Dysplastic colon s/p resection - per primary team. Surgical path pending. Please resume anticoagulation when safe from surgical standpoint.  2. History of paroxysmal atrial fib - maintaining NSR, remains NPO. Patient thinks he may be able to eat tomorrow. Amiodarone has long half life so he is likely still reaping benefits from chronic use, but will review with MD the timeframe at which we may need to consider IV form if he remains NPO. Note in 05/2016 he did not tolerate AF well and had developed hypotension. Could consider low dose IV beta blocker in the interim. His baseline HR is usually in the 50s-60s but he has been in the 80s-90s here with a brief burst  of PAF with RVR yesterday.   3. Cardiomyopathy - previously felt possibly 2/2 atrial fib. Euvolemic on exam. Recommend to lock IV fluids as soon as primary team feels safe. Will write to follow I/O's and daily weights.  4. HTN - BP variable. If this remains elevated may need rx.   Plan: Once he is tolerating oral medications would resume amiodarone 200 mg daily. Once okay from a surgical standpoint would begin apixaban 5 mg BID. Will discuss IV Amiodarone with Dr Derek [name] now that he has had brief PAF.   Signed, Derek Ransom, PA-C  08/18/2016, 11:45 AM  As above, patient seen and examined; no CP or dyspnea; remains in sinus this AM; resume amiodarone 200 mg daily; resume apixaban when ok with surgery Derek [name]

## 2016-08-19 MED ORDER — TRAZODONE HCL 50 MG PO TABS
150.0000 mg | ORAL_TABLET | Freq: Every day | ORAL | Status: DC
Start: 2016-08-19 — End: 2016-08-21
  Administered 2016-08-19 – 2016-08-20 (×2): 150 mg via ORAL
  Filled 2016-08-19 (×2): qty 3

## 2016-08-19 MED ORDER — PAROXETINE HCL 20 MG PO TABS
20.0000 mg | ORAL_TABLET | Freq: Every day | ORAL | Status: DC
Start: 2016-08-19 — End: 2016-08-21
  Administered 2016-08-19 – 2016-08-20 (×2): 20 mg via ORAL
  Filled 2016-08-19 (×2): qty 1

## 2016-08-19 MED ORDER — FEBUXOSTAT 40 MG PO TABS
40.0000 mg | ORAL_TABLET | Freq: Every day | ORAL | Status: DC
  Administered 2016-08-19 – 2016-08-21 (×3): 40 mg via ORAL
  Filled 2016-08-19 (×3): qty 1

## 2016-08-19 MED ORDER — ENSURE ENLIVE PO LIQD
237.0000 mL | Freq: Three times a day (TID) | ORAL | Status: DC
  Administered 2016-08-19 – 2016-08-21 (×4): 237 mL via ORAL

## 2016-08-19 MED ORDER — METHYLPREDNISOLONE SODIUM SUCC 40 MG IJ SOLR
5.0000 mg | Freq: Two times a day (BID) | INTRAMUSCULAR | Status: DC
  Administered 2016-08-19 – 2016-08-21 (×5): 5.2 mg via INTRAVENOUS
  Filled 2016-08-19 (×4): qty 1

## 2016-08-19 NOTE — Progress Notes (Signed)
[place] [facility] Office:  [phone] General Surgery Progress Note   LOS: 5 days  POD -  5 Days Post-Op  Assessment/Plan: 1. LAPAROSCOPIC exploration, RESECTION TRANSVERSE COLON,  Enterolysis of adhesions - 2 hour  Path - 1.2 cm adenoca, 0/7 nodes (T2, N0)  To advance to full liquids   2.  Gout involving left knee  Steroids seemed to have helped, will continue to taper  Placed by on Uloric  3.  ATRIAL FIBRILLATION, CONTROLLED (I48.91)  Followed by Dr. Aundra Dubin for cardiology  Placed back on Amiodarone   4.  ASCENDING AORTIC ANEURYSM (I71.2)             Impression: Followed by Dr. Servando Snare  Currently root is 5.1 cm - consider replacement at 5.5 cm  5. History of Crohn's  Prior ileocecectomy around 1978. He required a second operation. He had a fistula that took months to heal. He did not have straight forward surgery.  He has has some prior SBO secondary to adhesions from that surgery  6. History of PE 7. History of Hep C - from blood transfusion during his bowel resection in the 1970's 8. Nephrolithiasis  9. Post tramatic stress syndrome - from service  Restart Paxil 10. Chronic renal insufficiency  Creatinine - 1.44 - 08/18/2016   Seen by Dr. Lenna Sciara. Deterding q 3 months 11. DVT prophylaxis - SQ Heparin   Principal Problem:   Adenomatous polyp of transverse colon s/p partial colectomy 08/14/2016 Active Problems:   Ascending aortic aneurysm (HCC)   CKD (chronic kidney disease) stage 3, GFR 30-59 ml/min   Cardiomyopathy (HCC)   PAF (paroxysmal atrial fibrillation) (HCC)   Essential hypertension   S/P colon resection   Sinus bradycardia   Chronic anticoagulation  Subjective:  Doing okay.  Had another BM.  Ready to try more food.    Objective:   Vitals:   08/18/16 2227 08/19/16 0640  BP: 116/82 128/90  Pulse: 81 73  Resp: 18 20  Temp: 97.8 F (36.6 C) 98 F (36.7 C)     Intake/Output from previous day:  01/16 0701 -  01/17 0700 In: 2413.8 [P.O.:600; I.V.:1813.8] Out: -   Intake/Output this shift:  No intake/output data recorded.   Physical Exam:   General: Older WM who is alert and oriented.    HEENT: Normal. Pupils equal. .   Lungs: Clear.    Abdomen: Mild distention.  Has a few BS.   Wound: Clean   Extremities - Left knee okay - minimal discomfort.   Lab Results:    No results for input(s): WBC, HGB, HCT, PLT in the last 72 hours.  BMET    Recent Labs  08/18/16 0541  NA 133*  K 4.0  CL 96*  CO2 27  GLUCOSE 157*  BUN 27*  CREATININE 1.44*  CALCIUM 8.8*    PT/INR  No results for input(s): LABPROT, INR in the last 72 hours.  ABG  No results for input(s): PHART, HCO3 in the last 72 hours.  Invalid input(s): PCO2, PO2   Studies/Results:  No results found.   Anti-infectives:   Anti-infectives    Start     Dose/Rate Route Frequency Ordered Stop   08/14/16 0540  cefoTEtan (CEFOTAN) 2 g in dextrose 5 % 50 mL IVPB     2 g 100 mL/hr over 30 Minutes Intravenous On call to O.R. 08/14/16 0540 08/14/16 0750      [name], MD, FACS Pager: [place] [facility] Office: [phone] 08/19/2016

## 2016-08-19 NOTE — Progress Notes (Signed)
Initial Nutrition Assessment  DOCUMENTATION CODES:   Not applicable  INTERVENTION:  Reviewed full liquid menu with patient's son. Encouraged intake of items with adequate calories and protein.  Provide Ensure Enlive po TID, each supplement provides 350 kcal and 20 grams of protein.  NUTRITION DIAGNOSIS:   Inadequate oral intake related to poor appetite as evidenced by per patient/family report, other (see comment) (NPO/CLD for 4 days).  GOAL:   Patient will meet greater than or equal to 90% of their needs  MONITOR:   PO intake, Supplement acceptance, Labs, Weight trends, I & O's  REASON FOR ASSESSMENT:   Rounds    ASSESSMENT:   76 year old male with PMHx of gout, atrial fibrillation, ascending aortic aneurysm, Crohn's (ileocecectomy 1978), history of SBO, PE, Hep C, nephrolithiasis, chronic renal insufficiency who presented with colonic polyp now s/p laparoscopic exploration, resection of transverse colon and lysis of adhesions on 1/12.    -Patient advanced to CLD on 1/16 and FLD on 1/17.   Patient was sleeping at time of assessment but able to speak with son at bedside. Son reports patient's appetite is coming back now and had only been poor since the surgery on 1/12. PTA he had a good appetite and ate 3 meals daily. Patient having bowel movements. Experiencing some abdominal pain.   UBW around 200-205 lbs. Son reports weight was stable PTA - verified in chart. Patient may have lost 2.1 kg during admission - likely fluid loss after bolus given.   Medications reviewed and include: methylprednisolone 5.2 mg Q12hrs, D5-1/2NS @ 50 ml/hr (60 grams dextrose, 204 kcal daily).  Labs reviewed: Sodium 133, Chloride 96, Glucose 157, BUN 27, Creatinine 1.44.   Nutrition-Focused physical exam completed. Findings are no fat depletion, no muscle depletion, and no edema.   Discussed with RN.   Diet Order:  Diet full liquid Room service appropriate? Yes; Fluid consistency: Thin  Skin:   Reviewed, no issues  Last BM:  08/18/2016  Height:   Ht Readings from Last 1 Encounters:  08/14/16 6' (1.829 m)    Weight:   Wt Readings from Last 1 Encounters:  08/19/16 196 lb 10.4 oz (89.2 kg)    Ideal Body Weight:  80.9 kg  BMI:  Body mass index is 26.67 kg/m.  Estimated Nutritional Needs:   Kcal:  2015-2350 (MSJ x 1.2-1.4)  Protein:  105-125 grams (1.2-1.4 grams/kg)  Fluid:  2.2 L/day (25 ml/kg)  EDUCATION NEEDS:   No education needs identified at this time  Willey Blade, MS, RD, LDN Pager: [phone] After Hours Pager: [phone]

## 2016-08-19 NOTE — Plan of Care (Signed)
Problem: Education: Goal: Knowledge of [facility] Education information/materials will improve Outcome: Completed/Met Date Met: 08/19/16 Plan discussed, pt denied questions concerns   

## 2016-08-19 NOTE — Progress Notes (Signed)
Patient Name: Derek [name] Date of Encounter: 08/19/2016  Primary Cardiologist: [facility] Problem List     Principal Problem:   Adenomatous polyp of transverse colon s/p partial colectomy 08/14/2016 Active Problems:   Ascending aortic aneurysm (HCC)   CKD (chronic kidney disease) stage 3, GFR 30-59 ml/min   Cardiomyopathy (HCC)   PAF (paroxysmal atrial fibrillation) (HCC)   Essential hypertension   S/P colon resection   Sinus bradycardia   Chronic anticoagulation    Subjective   No chest pain or SOB. Tolerating full liquid diet  Inpatient Medications    . alvimopan  12 mg Oral BID  . amiodarone  200 mg Oral Daily  . febuxostat  40 mg Oral Daily  . feeding supplement (ENSURE ENLIVE)  237 mL Oral TID BM  . heparin subcutaneous  5,000 Units Subcutaneous Q8H  . methylPREDNISolone (SOLU-MEDROL) injection  5.2 mg Intravenous Q12H  . PARoxetine  20 mg Oral QHS  . traZODone  150 mg Oral QHS    Vital Signs    Vitals:   08/18/16 0554 08/18/16 1300 08/18/16 2227 08/19/16 0640  BP: 124/82 120/82 116/82 128/90  Pulse: 79 82 81 73  Resp: 20 18 18 20   Temp: 98.2 F (36.8 C) 98 F (36.7 C) 97.8 F (36.6 C) 98 F (36.7 C)  TempSrc: Oral Oral Oral Oral  SpO2: 95% 97% 98% 95%  Weight: 195 lb 15.8 oz (88.9 kg)   196 lb 10.4 oz (89.2 kg)  Height:        Intake/Output Summary (Last 24 hours) at 08/19/16 1352 Last data filed at 08/19/16 0600  Gross per 24 hour  Intake          1753.75 ml  Output                0 ml  Net          1753.75 ml   Filed Weights   08/17/16 1015 08/18/16 0554 08/19/16 0640  Weight: 204 lb 9.4 oz (92.8 kg) 195 lb 15.8 oz (88.9 kg) 196 lb 10.4 oz (89.2 kg)    Physical Exam    General: Well developed, well nourished WM in no acute distress. Lying flat in bed HEENT: Normocephalic, atraumatic, sclera non-icteric, no xanthomas, nares are without discharge. Neck: Negative for carotid bruits. JVP not elevated. Lungs: Clear bilaterally  to auscultation without wheezes, rales, or rhonchi. Breathing is unlabored. Cardiac: RRR S1 S2 without murmurs, rubs, or gallops.  Abdomen: Surgical wound dry and intact, BS diminnished Extremities: No clubbing or cyanosis. No edema except mild swelling left knee. Distal pedal pulses are 2+ and equal bilaterally. Skin: Warm and dry, no significant rash. Neuro: Alert and oriented X 3. Sensation in tact. Follows commands. Psych:  Responds to questions appropriately with a normal affect.  Labs    CBC No results for input(s): WBC, NEUTROABS, HGB, HCT, MCV, PLT in the last 72 hours. Basic Metabolic Panel  Recent Labs  08/18/16 0541  NA 133*  K 4.0  CL 96*  CO2 27  GLUCOSE 157*  BUN 27*  CREATININE 1.44*  CALCIUM 8.8*  MG 1.9    Telemetry    NSR-he has had runs of breakthrough PAF with RVR. NSR now  Radiology     Patient Profile     89M with aortic aneurysm (5.3cm dilated aortic root & 4.3cm ascending thoracic aorta 03/2016 followed by Dr. Servando Snare,) , mild to mod AI, sinus bradycardia (baseline HR previously 57s), Crohn's disease,  HTN, CKD 3, thrombocytopenia, normal nuc 09/2013, LVEF 45-50% 05/2016 (felt possibly due to atrial fib), and PAF (diagnosed pre-op 05/2016 s/p TEE/DCCV) admitted for dyplastic polyp of left transverse colon, s/p resection 08/14/16. It appears that BB was stopped due to hypotension in 05/2016.   Assessment & Plan    1. Dysplastic colon s/p resection - per primary team. Surgical path pending. Please resume anticoagulation when safe from surgical standpoint.  2. History of paroxysmal atrial fib - maintaining NSR but he has had runs of PAF with RVR post op. PO Amiodarone resumed.  He did not tolerate AF well in the past. His baseline HR is usually in the 50s-60s -no on beta blocker prior to adm  3. Cardiomyopathy - previously felt possibly 2/2 atrial fib. Euvolemic on exam. Recommend to lock IV fluids as soon as primary team feels safe. Will write to  follow I/O's and daily weights.  4. HTN - BP variable. If this remains elevated may need rx.   Plan:  Once okay from a surgical standpoint would begin apixaban 5 mg BID. Consider addition of low dose beta blocker.  Signed, Kerin Ransom, PA-C  08/19/2016, 1:52 PM  As above, patient seen and examined. No chest pains or dyspnea. He remains in sinus rhythm. Continue amiodarone. Would resume apixaban 5 BID at DC when ok with surgery. We will sign off. Please call with questions.  Kirk Ruths, MD

## 2016-08-20 NOTE — Progress Notes (Signed)
Pt to be discharged tomorrow. He is tolerating his diet and holding NSR. I spoke with [name]- resume Xarelto 15 mg at discharge. The pt had been followed by Dr Aundra Dubin but will need a new cardiologist. He lives in [place] but would prefer to come to [facility] for f/u. I will arrange to see him in two and then Dr Stanford Breed can follow him long term.  Kerin Ransom PA-C 08/20/2016 9:42 AM

## 2016-08-20 NOTE — Progress Notes (Signed)
[facility] Office:  [phone] General Surgery Progress Note   LOS: 6 days  POD -  6 Days Post-Op  Assessment/Plan: 1. LAPAROSCOPIC exploration, RESECTION TRANSVERSE COLON,  Enterolysis of adhesions - 2 hour  Path - 1.2 cm adenoca, 0/7 nodes (T2, N0)  Did well with liquids - will advance to reg diet  2.  Gout involving left knee  Solumedrol down to 5 mg BID  On Uloric  3.  ATRIAL FIBRILLATION, CONTROLLED (I48.91)  Followed by Dr. Aundra Dubin for cardiology  On Amiodarone  Restart anticoagulation at discharge - apixaban 5 mg BID   4.  ASCENDING AORTIC ANEURYSM (I71.2)             Impression: Followed by Dr. Servando Snare  Currently root is 5.1 cm - consider replacement at 5.5 cm  5. History of Crohn's  Prior ileocecectomy around 1978. He required a second operation. He had a fistula that took months to heal. He did not have straight forward surgery.  He has has some prior SBO secondary to adhesions from that surgery  6. History of PE 7. History of Hep C - from blood transfusion during his bowel resection in the 1970's 8. Nephrolithiasis  9. Post tramatic stress syndrome - from service  Restart Paxil 10. Chronic renal insufficiency  Creatinine - 1.44 - 08/18/2016   Seen by Dr. Lenna Sciara. Deterding q 3 months 11. DVT prophylaxis - SQ Heparin   Principal Problem:   Adenomatous polyp of transverse colon s/p partial colectomy 08/14/2016 Active Problems:   Ascending aortic aneurysm (HCC)   CKD (chronic kidney disease) stage 3, GFR 30-59 ml/min   Cardiomyopathy (HCC)   PAF (paroxysmal atrial fibrillation) (HCC)   Essential hypertension   S/P colon resection   Sinus bradycardia   Chronic anticoagulation  Subjective:  Doing okay.  Tolerated full liquids.  To advance diet.  Objective:   Vitals:   08/19/16 2208 08/20/16 0509  BP: 121/76 124/76  Pulse: 95 84  Resp: 19 19  Temp: 99.5 F (37.5 C) 97.5 F (36.4 C)     Intake/Output  from previous day:  01/17 0701 - 01/18 0700 In: 1502.5 [P.O.:240; I.V.:1262.5] Out: -   Intake/Output this shift:  No intake/output data recorded.   Physical Exam:   General: Older WM who is alert and oriented.    HEENT: Normal. Pupils equal. .   Lungs: Clear.    Abdomen: Mild distention.  Has a few BS.   Wound: Clean   Extremities - Left knee doing okay   Lab Results:    No results for input(s): WBC, HGB, HCT, PLT in the last 72 hours.  BMET    Recent Labs  08/18/16 0541  NA 133*  K 4.0  CL 96*  CO2 27  GLUCOSE 157*  BUN 27*  CREATININE 1.44*  CALCIUM 8.8*    PT/INR  No results for input(s): LABPROT, INR in the last 72 hours.  ABG  No results for input(s): PHART, HCO3 in the last 72 hours.  Invalid input(s): PCO2, PO2   Studies/Results:  No results found.   Anti-infectives:   Anti-infectives    Start     Dose/Rate Route Frequency Ordered Stop   08/14/16 0540  cefoTEtan (CEFOTAN) 2 g in dextrose 5 % 50 mL IVPB     2 g 100 mL/hr over 30 Minutes Intravenous On call to O.R. 08/14/16 0540 08/14/16 0750      [name], MD, FACS Pager: [facility] Office: [phone] 08/20/2016

## 2016-08-20 NOTE — Progress Notes (Signed)
Pharmacy Brief Note - Alvimopan (Entereg)  The standing order set for alvimopan (Entereg) now includes an automatic order to discontinue the drug after the patient has had a bowel movement.  The change was approved by the [place] and the Medical Executive Committee.    This patient has had a bowel movement documented.  Therefore, alvimopan has been discontinued.  If there are questions, please contact the pharmacy at [phone].  Thank you  Gretta Arab PharmD, BCPS Pager [phone] 08/20/2016 8:53 AM

## 2016-08-21 ENCOUNTER — Telehealth: Payer: Self-pay | Admitting: Cardiology

## 2016-08-21 ENCOUNTER — Other Ambulatory Visit: Payer: Self-pay | Admitting: *Deleted

## 2016-08-21 DIAGNOSIS — I712 Thoracic aortic aneurysm, without rupture: Secondary | ICD-10-CM

## 2016-08-21 DIAGNOSIS — I7121 Aneurysm of the ascending aorta, without rupture: Secondary | ICD-10-CM

## 2016-08-21 MED ORDER — HYDROCODONE-ACETAMINOPHEN 5-325 MG PO TABS: 1.0000 | ORAL_TABLET | Freq: Four times a day (QID) | ORAL | 0 refills | Status: DC | PRN

## 2016-08-21 NOTE — Discharge Summary (Signed)
Physician Discharge Summary  Patient ID:  Derek [name]  MRN: [id number]  DOB/AGE: 1941/03/14 76 y.o.  Admit date: 08/14/2016 Discharge date: 08/21/2016  Discharge Diagnoses:  1. 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  Gout involving left knee             On Uloric  3. ATRIAL FIBRILLATION, CONTROLLED (I48.91)             Followed by Dr. Aundra Dubin for cardiology             On Amiodarone             Restart anticoagulation at discharge - apixaban 5 mg BID              4. ASCENDING AORTIC ANEURYSM (I71.2) Impression: Followed by Dr. Servando Snare       Currently root is 5.1 cm - consider replacement at 5.5 cm  5. History of Crohn's       Prior ileocecectomy around 1978. He required a second operation. He had a fistula that took months to heal. He did not have straight forward surgery.        He has has some prior SBO secondary to adhesions from that surgery 6. History of PE 7. History of Hep C - from blood transfusion during his bowel resection in the 1970's 8. Nephrolithiasis  9. Post tramatic stress syndrome - from service             Restart Paxil 10. Chronic renal insufficiency             Creatinine - 1.44 - 08/18/2016       Seen by Dr. Lenna Sciara. Deterding q 3 months    Principal Problem:   Adenomatous polyp of transverse colon s/p partial colectomy 08/14/2016 Active Problems:   Ascending aortic aneurysm (HCC)   CKD (chronic kidney disease) stage 3, GFR 30-59 ml/min   Cardiomyopathy ([place])   PAF (paroxysmal atrial fibrillation) ([place])   Essential hypertension   S/P colon resection   Sinus bradycardia   Chronic anticoagulation  Operation: Procedure(s): LAPAROSCOPIC exploration, Open RESECTION TRANSVERSE COLON,  Enterolysis of adhesions - 2 hours on 08/14/2016 - D. [name]  Discharged Condition: good  Hospital Course: Derek [name] is an 76 y.o. male whose  primary care physician is Purvis Kilts, MD and who was admitted 08/14/2016 with a chief complaint of transverse colon lesion identified on colonoscopy by Dr. Carlean Purl in August, 2017. His surgery was delayed because of new onset A. Fib which required a cardiac workup and anticoagulation. He was brought to the operating room on 08/14/2016 and underwent  LAPAROSCOPIC exploration, RESECTION TRANSVERSE COLON,  Enterolysis of adhesions - 2 hours.   Because of his heart and renal disease, he was placed in step down ICU for 2 days.  He was given Entereg early for bowel function return. By the third post op day, I was able to get his NGT out.  But he developed left knee pain, which was thought to be secondary to his gout.  I started him on a Solumedrol bolus.  By the 4th post op day, he had a bowel movement.  I advanced his diet.  His leg pain was improved. His heart and renal function did well during the hospitalization.  He is now 7 days post op.  He is tolerating a regular diet, he is afebrile, and his incision looks good.  His  wife is in the room and he is ready to go home.  The discharge instructions were reviewed with the patient.  Consults: cardiology  Significant Diagnostic Studies: Results for orders placed or performed during the hospital encounter of 08/14/16  MRSA PCR Screening  Result Value Ref Range   MRSA by PCR NEGATIVE NEGATIVE  Potassium  Result Value Ref Range   Potassium 4.7 3.5 - 5.1 mmol/L  Basic metabolic panel  Result Value Ref Range   Sodium 135 135 - 145 mmol/L   Potassium 5.2 (H) 3.5 - 5.1 mmol/L   Chloride 101 101 - 111 mmol/L   CO2 25 22 - 32 mmol/L   Glucose, Bld 137 (H) 65 - 99 mg/dL   BUN 16 6 - 20 mg/dL   Creatinine, Ser 1.86 (H) 0.61 - 1.24 mg/dL   Calcium 8.5 (L) 8.9 - 10.3 mg/dL   GFR calc non Af Amer 34 (L) >60 mL/min   GFR calc Af Amer 40 (L) >60 mL/min   Anion gap 9 5 - 15  CBC  Result Value Ref Range   WBC 11.3 (H) 4.0 - 10.5 K/uL   RBC 4.47  4.22 - 5.81 MIL/uL   Hemoglobin 12.8 (L) 13.0 - 17.0 g/dL   HCT 39.0 39.0 - 52.0 %   MCV 87.2 78.0 - 100.0 fL   MCH 28.6 26.0 - 34.0 pg   MCHC 32.8 30.0 - 36.0 g/dL   RDW 13.6 11.5 - 15.5 %   Platelets 136 (L) 150 - 400 K/uL  CBC with Differential/Platelet  Result Value Ref Range   WBC 10.7 (H) 4.0 - 10.5 K/uL   RBC 4.53 4.22 - 5.81 MIL/uL   Hemoglobin 13.0 13.0 - 17.0 g/dL   HCT 39.7 39.0 - 52.0 %   MCV 87.6 78.0 - 100.0 fL   MCH 28.7 26.0 - 34.0 pg   MCHC 32.7 30.0 - 36.0 g/dL   RDW 13.9 11.5 - 15.5 %   Platelets 129 (L) 150 - 400 K/uL   Neutrophils Relative % 86 %   Neutro Abs 9.2 (H) 1.7 - 7.7 K/uL   Lymphocytes Relative 7 %   Lymphs Abs 0.7 0.7 - 4.0 K/uL   Monocytes Relative 7 %   Monocytes Absolute 0.8 0.1 - 1.0 K/uL   Eosinophils Relative 0 %   Eosinophils Absolute 0.0 0.0 - 0.7 K/uL   Basophils Relative 0 %   Basophils Absolute 0.0 0.0 - 0.1 K/uL  Basic metabolic panel  Result Value Ref Range   Sodium 133 (L) 135 - 145 mmol/L   Potassium 4.6 3.5 - 5.1 mmol/L   Chloride 97 (L) 101 - 111 mmol/L   CO2 28 22 - 32 mmol/L   Glucose, Bld 121 (H) 65 - 99 mg/dL   BUN 14 6 - 20 mg/dL   Creatinine, Ser 1.58 (H) 0.61 - 1.24 mg/dL   Calcium 8.8 (L) 8.9 - 10.3 mg/dL   GFR calc non Af Amer 42 (L) >60 mL/min   GFR calc Af Amer 48 (L) >60 mL/min   Anion gap 8 5 - 15  Basic metabolic panel  Result Value Ref Range   Sodium 133 (L) 135 - 145 mmol/L   Potassium 4.0 3.5 - 5.1 mmol/L   Chloride 96 (L) 101 - 111 mmol/L   CO2 27 22 - 32 mmol/L   Glucose, Bld 157 (H) 65 - 99 mg/dL   BUN 27 (H) 6 - 20 mg/dL   Creatinine, Ser 1.44 (H) 0.61 -  1.24 mg/dL   Calcium 8.8 (L) 8.9 - 10.3 mg/dL   GFR calc non Af Amer 47 (L) >60 mL/min   GFR calc Af Amer 54 (L) >60 mL/min   Anion gap 10 5 - 15  Magnesium  Result Value Ref Range   Magnesium 1.9 1.7 - 2.4 mg/dL  Troponin I (q 6hr x 3)  Result Value Ref Range   Troponin I <0.03 <0.03 ng/mL  Troponin I (q 6hr x 3)  Result Value Ref  Range   Troponin I <0.03 <0.03 ng/mL  Troponin I (q 6hr x 3)  Result Value Ref Range   Troponin I <0.03 <0.03 ng/mL    No results found.  Discharge Exam:  Vitals:   08/21/16 0044 08/21/16 0632  BP:  131/88  Pulse:  71  Resp:  17  Temp: 98.7 F (37.1 C) 98 F (36.7 C)    General: WN older WM who is alert.  He chipped his right incisor. Lungs: Clear to auscultation and symmetric breath sounds. Heart:  RRR. No murmur or rub. Abdomen: Soft. No mass. His incision is healing well.  Extremities:  His left knee is doing okay.  Discharge Medications:   Allergies as of 08/21/2016      Reactions   Lorazepam Other (See Comments)   Reaction:  Hallucinations    Humira [adalimumab] Other (See Comments)   Pt states that he got pancreatitis.        Medication List    STOP taking these medications   Rivaroxaban 15 MG Tabs tablet Commonly known as:  XARELTO   UROCIT-K 10 PO     TAKE these medications   Acidophilus Lactobacillus Caps Take 1 capsule by mouth daily. What changed:  when to take this   amiodarone 200 MG tablet Commonly known as:  PACERONE Take 1 tablet (200 mg total) by mouth 2 (two) times daily.   calcium carbonate 500 MG chewable tablet Commonly known as:  TUMS - dosed in mg elemental calcium Chew 2 tablets by mouth 2 (two) times daily.   cyanocobalamin 1000 MCG/ML injection Commonly known as:  (VITAMIN B-12) Inject 1,000 mcg into the muscle every 14 (fourteen) days.   cycloSPORINE 0.05 % ophthalmic emulsion Commonly known as:  RESTASIS Place 1 drop into both eyes 4 (four) times daily.   febuxostat 40 MG tablet Commonly known as:  ULORIC Take 80 mg by mouth daily.   gabapentin 100 MG capsule Commonly known as:  NEURONTIN Take 100-200 mg by mouth 2 (two) times daily. Pt takes one tablet in the morning and two at night.   HYDROcodone-acetaminophen 5-325 MG tablet Commonly known as:  NORCO/VICODIN Take 1-2 tablets by mouth every 6 (six) hours as  needed for moderate pain.   Magnesium Oxide 420 MG Tabs Take 420 mg by mouth 2 (two) times daily.   minocycline 100 MG capsule Commonly known as:  MINOCIN,DYNACIN Take 100 mg by mouth daily as needed (when breakout occurs on face).   multivitamin with minerals Tabs tablet Take 1 tablet by mouth daily.   omeprazole 20 MG capsule Commonly known as:  PRILOSEC Take 20 mg by mouth daily.   PARoxetine 20 MG tablet Commonly known as:  PAXIL Take 20 mg by mouth at bedtime.   promethazine 25 MG tablet Commonly known as:  PHENERGAN Take 25 mg by mouth every 8 (eight) hours as needed for nausea or vomiting.   REFRESH OP Apply 1 drop to eye 2 (two) times daily.   saccharomyces  boulardii 250 MG capsule Commonly known as:  FLORASTOR Take 1 capsule (250 mg total) by mouth 2 (two) times daily.   terazosin 2 MG capsule Commonly known as:  HYTRIN Take 2 mg by mouth at bedtime.   traZODone 150 MG tablet Commonly known as:  DESYREL Take 150 mg by mouth at bedtime.   Vitamin D3 1000 units Caps Take 1,000 Units by mouth daily.       Disposition: 01-Home or Self Care  Discharge Instructions    Diet - low sodium heart healthy    Complete by:  As directed    Increase activity slowly    Complete by:  As directed       Follow-up Information    Kerin Ransom, PA-C Follow up.   Specialties:  Cardiology, Radiology Why:  office will contact you Monday with an appointment Contact information: [address] [place] [address] [phone]           Activity:  Driving - May drive in 3 or 4 days   Lifting - No lifting more than 15 pounds for 2 weeks, then no limit  Wound Care:   May shower  Diet:  As tolerated,  Drink plenty of water  Follow up appointment:  Call Dr. Pollie Friar office [facility]) at [phone] for an appointment in 2 to 3 weeks  Medications and dosages:  Resume your home medications.  You have a prescription for:  Vicodin              You have Eliquis prescription already.   Signed: [name], M.D., [facility] Office:  [phone]  08/21/2016, 12:35 PM

## 2016-08-21 NOTE — Telephone Encounter (Signed)
PT'S WIFE AWARE  TO CONTACT  DR  [name] 'S OFFICE  FOR  WHEN  PT MAY  RESTART ELIQUIS .Adonis Housekeeper

## 2016-08-21 NOTE — Plan of Care (Signed)
Problem: Pain Managment: Goal: General experience of comfort will improve Outcome: Completed/Met Date Met: 08/21/16 Pain is being managed with medication.

## 2016-08-21 NOTE — Discharge Instructions (Signed)
[facility] - DISCHARGE INSTRUCTIONS TO PATIENT  Activity:  Driving - May drive in 3 or 4 days   Lifting - No lifting more than 15 pounds for 2 weeks, then no limit  Wound Care:   May shower  Diet:  As tolerated,  Drink plenty of water  Follow up appointment:  Call Dr. Pollie Friar office [facility]) at [phone] for an appointment in 2 to 3 weeks  Medications and dosages:  Resume your home medications.  You have a prescription for:  Vicodin             You have Eliquis prescription already.  Call Dr. [name] or his office  [phone]) if you have:  Temperature greater than 100.4,  Persistent nausea and vomiting,  Severe uncontrolled pain,  Redness, tenderness, or signs of infection (pain, swelling, redness, odor or green/yellow discharge around the site),  Any other questions or concerns you may have after discharge.  In an emergency, call 911 or go to an Emergency Department at a nearby hospital.

## 2016-08-21 NOTE — Progress Notes (Signed)
Patient has been discharged home. Pt is alert and oriented x4, ambulatory without assist. abd incision sites remain clean and intact. Discharge instructions reviewed, pt denied questions, concerns. No change from am assessment

## 2016-08-21 NOTE — Telephone Encounter (Signed)
Derek [name] is calling because her husband Derek [name]) was discharged from surgery today and she would like to know when he is supposed to start taking the blood thinner medication again. Please call, thanks.

## 2016-08-21 NOTE — Plan of Care (Signed)
Problem: Education: Goal: Knowledge of the prescribed therapeutic regimen will improve Outcome: Completed/Met Date Met: 08/21/16 Pt verbal;ized understanding

## 2016-08-27 ENCOUNTER — Inpatient Hospital Stay (HOSPITAL_COMMUNITY): Payer: Medicare Other

## 2016-08-27 ENCOUNTER — Other Ambulatory Visit: Payer: Self-pay

## 2016-08-27 ENCOUNTER — Emergency Department (HOSPITAL_COMMUNITY): Payer: Medicare Other

## 2016-08-27 ENCOUNTER — Encounter (HOSPITAL_COMMUNITY): Payer: Self-pay | Admitting: Emergency Medicine

## 2016-08-27 ENCOUNTER — Emergency Department (HOSPITAL_COMMUNITY): Payer: Medicare Other | Admitting: Anesthesiology

## 2016-08-27 ENCOUNTER — Ambulatory Visit (HOSPITAL_COMMUNITY)
Admission: RE | Admit: 2016-08-27 | Discharge: 2016-08-27 | Disposition: A | Discharge status: Home / Self Care | Payer: Medicare Other | Source: Ambulatory Visit | Attending: Family Medicine | Admitting: Family Medicine

## 2016-08-27 ENCOUNTER — Inpatient Hospital Stay (HOSPITAL_COMMUNITY)
Admission: EM | Admit: 2016-08-27 | Discharge: 2016-09-14 | DRG: 329 | Disposition: A | Discharge status: Home Health | Payer: Medicare Other | Attending: Surgery | Admitting: Surgery

## 2016-08-27 ENCOUNTER — Encounter (HOSPITAL_COMMUNITY)
Admission: EM | Disposition: A | Discharge status: Home Health | Payer: Self-pay | Source: Home / Self Care | Attending: Surgery

## 2016-08-27 ENCOUNTER — Other Ambulatory Visit (HOSPITAL_COMMUNITY): Payer: Self-pay | Admitting: Family Medicine

## 2016-08-27 DIAGNOSIS — R05 Cough: Secondary | ICD-10-CM | POA: Diagnosis not present

## 2016-08-27 DIAGNOSIS — R571 Hypovolemic shock: Secondary | ICD-10-CM | POA: Diagnosis present

## 2016-08-27 DIAGNOSIS — Z9841 Cataract extraction status, right eye: Secondary | ICD-10-CM

## 2016-08-27 DIAGNOSIS — D62 Acute posthemorrhagic anemia: Secondary | ICD-10-CM | POA: Diagnosis not present

## 2016-08-27 DIAGNOSIS — N2581 Secondary hyperparathyroidism of renal origin: Secondary | ICD-10-CM | POA: Diagnosis present

## 2016-08-27 DIAGNOSIS — M109 Gout, unspecified: Secondary | ICD-10-CM | POA: Diagnosis present

## 2016-08-27 DIAGNOSIS — K659 Peritonitis, unspecified: Secondary | ICD-10-CM | POA: Diagnosis present

## 2016-08-27 DIAGNOSIS — Z452 Encounter for adjustment and management of vascular access device: Secondary | ICD-10-CM | POA: Diagnosis not present

## 2016-08-27 DIAGNOSIS — R579 Shock, unspecified: Secondary | ICD-10-CM | POA: Diagnosis not present

## 2016-08-27 DIAGNOSIS — J209 Acute bronchitis, unspecified: Secondary | ICD-10-CM | POA: Diagnosis not present

## 2016-08-27 DIAGNOSIS — C184 Malignant neoplasm of transverse colon: Secondary | ICD-10-CM | POA: Diagnosis not present

## 2016-08-27 DIAGNOSIS — R109 Unspecified abdominal pain: Secondary | ICD-10-CM

## 2016-08-27 DIAGNOSIS — K449 Diaphragmatic hernia without obstruction or gangrene: Secondary | ICD-10-CM | POA: Diagnosis present

## 2016-08-27 DIAGNOSIS — Y832 Surgical operation with anastomosis, bypass or graft as the cause of abnormal reaction of the patient, or of later complication, without mention of misadventure at the time of the procedure: Secondary | ICD-10-CM | POA: Diagnosis present

## 2016-08-27 DIAGNOSIS — Z7901 Long term (current) use of anticoagulants: Secondary | ICD-10-CM

## 2016-08-27 DIAGNOSIS — F329 Major depressive disorder, single episode, unspecified: Secondary | ICD-10-CM | POA: Diagnosis present

## 2016-08-27 DIAGNOSIS — K5090 Crohn's disease, unspecified, without complications: Secondary | ICD-10-CM | POA: Diagnosis present

## 2016-08-27 DIAGNOSIS — I517 Cardiomegaly: Secondary | ICD-10-CM | POA: Insufficient documentation

## 2016-08-27 DIAGNOSIS — J9811 Atelectasis: Secondary | ICD-10-CM

## 2016-08-27 DIAGNOSIS — Z85038 Personal history of other malignant neoplasm of large intestine: Secondary | ICD-10-CM

## 2016-08-27 DIAGNOSIS — K55041 Focal (segmental) acute infarction of large intestine: Secondary | ICD-10-CM | POA: Diagnosis present

## 2016-08-27 DIAGNOSIS — K559 Vascular disorder of intestine, unspecified: Secondary | ICD-10-CM

## 2016-08-27 DIAGNOSIS — I700 Atherosclerosis of aorta: Secondary | ICD-10-CM | POA: Insufficient documentation

## 2016-08-27 DIAGNOSIS — Z9889 Other specified postprocedural states: Secondary | ICD-10-CM | POA: Insufficient documentation

## 2016-08-27 DIAGNOSIS — K631 Perforation of intestine (nontraumatic): Secondary | ICD-10-CM | POA: Diagnosis present

## 2016-08-27 DIAGNOSIS — E861 Hypovolemia: Secondary | ICD-10-CM | POA: Diagnosis not present

## 2016-08-27 DIAGNOSIS — N200 Calculus of kidney: Secondary | ICD-10-CM | POA: Diagnosis not present

## 2016-08-27 DIAGNOSIS — E663 Overweight: Secondary | ICD-10-CM | POA: Diagnosis not present

## 2016-08-27 DIAGNOSIS — A419 Sepsis, unspecified organism: Secondary | ICD-10-CM | POA: Diagnosis present

## 2016-08-27 DIAGNOSIS — J069 Acute upper respiratory infection, unspecified: Secondary | ICD-10-CM

## 2016-08-27 DIAGNOSIS — K55049 Acute infarction of large intestine, extent unspecified: Secondary | ICD-10-CM | POA: Diagnosis not present

## 2016-08-27 DIAGNOSIS — K55019 Acute (reversible) ischemia of small intestine, extent unspecified: Secondary | ICD-10-CM | POA: Diagnosis not present

## 2016-08-27 DIAGNOSIS — Z888 Allergy status to other drugs, medicaments and biological substances status: Secondary | ICD-10-CM

## 2016-08-27 DIAGNOSIS — I129 Hypertensive chronic kidney disease with stage 1 through stage 4 chronic kidney disease, or unspecified chronic kidney disease: Secondary | ICD-10-CM | POA: Diagnosis present

## 2016-08-27 DIAGNOSIS — I4891 Unspecified atrial fibrillation: Secondary | ICD-10-CM | POA: Diagnosis present

## 2016-08-27 DIAGNOSIS — E538 Deficiency of other specified B group vitamins: Secondary | ICD-10-CM | POA: Diagnosis present

## 2016-08-27 DIAGNOSIS — G629 Polyneuropathy, unspecified: Secondary | ICD-10-CM | POA: Diagnosis present

## 2016-08-27 DIAGNOSIS — R1013 Epigastric pain: Secondary | ICD-10-CM | POA: Diagnosis not present

## 2016-08-27 DIAGNOSIS — Z96612 Presence of left artificial shoulder joint: Secondary | ICD-10-CM | POA: Diagnosis present

## 2016-08-27 DIAGNOSIS — Z87891 Personal history of nicotine dependence: Secondary | ICD-10-CM

## 2016-08-27 DIAGNOSIS — F4310 Post-traumatic stress disorder, unspecified: Secondary | ICD-10-CM | POA: Diagnosis present

## 2016-08-27 DIAGNOSIS — K219 Gastro-esophageal reflux disease without esophagitis: Secondary | ICD-10-CM | POA: Diagnosis present

## 2016-08-27 DIAGNOSIS — Z79899 Other long term (current) drug therapy: Secondary | ICD-10-CM

## 2016-08-27 DIAGNOSIS — I959 Hypotension, unspecified: Secondary | ICD-10-CM | POA: Diagnosis not present

## 2016-08-27 DIAGNOSIS — G4700 Insomnia, unspecified: Secondary | ICD-10-CM | POA: Diagnosis present

## 2016-08-27 DIAGNOSIS — R1010 Upper abdominal pain, unspecified: Secondary | ICD-10-CM

## 2016-08-27 DIAGNOSIS — B1920 Unspecified viral hepatitis C without hepatic coma: Secondary | ICD-10-CM | POA: Diagnosis present

## 2016-08-27 DIAGNOSIS — I712 Thoracic aortic aneurysm, without rupture: Secondary | ICD-10-CM | POA: Diagnosis present

## 2016-08-27 DIAGNOSIS — I319 Disease of pericardium, unspecified: Secondary | ICD-10-CM | POA: Diagnosis not present

## 2016-08-27 DIAGNOSIS — N179 Acute kidney failure, unspecified: Secondary | ICD-10-CM | POA: Diagnosis present

## 2016-08-27 DIAGNOSIS — R6521 Severe sepsis with septic shock: Secondary | ICD-10-CM | POA: Diagnosis present

## 2016-08-27 DIAGNOSIS — G2581 Restless legs syndrome: Secondary | ICD-10-CM | POA: Diagnosis present

## 2016-08-27 DIAGNOSIS — Z86711 Personal history of pulmonary embolism: Secondary | ICD-10-CM

## 2016-08-27 DIAGNOSIS — K9189 Other postprocedural complications and disorders of digestive system: Principal | ICD-10-CM | POA: Diagnosis present

## 2016-08-27 DIAGNOSIS — R739 Hyperglycemia, unspecified: Secondary | ICD-10-CM | POA: Diagnosis present

## 2016-08-27 DIAGNOSIS — K352 Acute appendicitis with generalized peritonitis: Secondary | ICD-10-CM | POA: Diagnosis not present

## 2016-08-27 DIAGNOSIS — I2510 Atherosclerotic heart disease of native coronary artery without angina pectoris: Secondary | ICD-10-CM | POA: Diagnosis present

## 2016-08-27 DIAGNOSIS — M25512 Pain in left shoulder: Secondary | ICD-10-CM | POA: Diagnosis not present

## 2016-08-27 DIAGNOSIS — M25511 Pain in right shoulder: Secondary | ICD-10-CM | POA: Diagnosis not present

## 2016-08-27 DIAGNOSIS — N183 Chronic kidney disease, stage 3 (moderate): Secondary | ICD-10-CM | POA: Diagnosis present

## 2016-08-27 DIAGNOSIS — I9581 Postprocedural hypotension: Secondary | ICD-10-CM | POA: Diagnosis not present

## 2016-08-27 DIAGNOSIS — Z9842 Cataract extraction status, left eye: Secondary | ICD-10-CM

## 2016-08-27 DIAGNOSIS — I480 Paroxysmal atrial fibrillation: Secondary | ICD-10-CM | POA: Diagnosis not present

## 2016-08-27 DIAGNOSIS — F419 Anxiety disorder, unspecified: Secondary | ICD-10-CM | POA: Diagnosis present

## 2016-08-27 DIAGNOSIS — Z6825 Body mass index (BMI) 25.0-25.9, adult: Secondary | ICD-10-CM | POA: Diagnosis not present

## 2016-08-27 DIAGNOSIS — R0902 Hypoxemia: Secondary | ICD-10-CM | POA: Diagnosis present

## 2016-08-27 DIAGNOSIS — K660 Peritoneal adhesions (postprocedural) (postinfection): Secondary | ICD-10-CM | POA: Diagnosis present

## 2016-08-27 DIAGNOSIS — R918 Other nonspecific abnormal finding of lung field: Secondary | ICD-10-CM | POA: Diagnosis not present

## 2016-08-27 DIAGNOSIS — I739 Peripheral vascular disease, unspecified: Secondary | ICD-10-CM | POA: Diagnosis present

## 2016-08-27 HISTORY — PX: LAPAROTOMY: SHX154

## 2016-08-27 HISTORY — DX: Perforation of intestine (nontraumatic): K63.1

## 2016-08-27 HISTORY — DX: Vascular disorder of intestine, unspecified: K55.9

## 2016-08-27 LAB — COMPREHENSIVE METABOLIC PANEL
ALBUMIN: 2.6 g/dL — AB (ref 3.5–5.0)
ALK PHOS: 253 U/L — AB (ref 38–126)
ALK PHOS: 166 U/L — AB (ref 38–126)
ALT: 60 U/L (ref 17–63)
ALT: 39 U/L (ref 17–63)
ANION GAP: 9 (ref 5–15)
ANION GAP: 10 (ref 5–15)
AST: 40 U/L (ref 15–41)
AST: 35 U/L (ref 15–41)
Albumin: 2.9 g/dL — ABNORMAL LOW (ref 3.5–5.0)
BILIRUBIN TOTAL: 0.5 mg/dL (ref 0.3–1.2)
BUN: 34 mg/dL — ABNORMAL HIGH (ref 6–20)
BUN: 28 mg/dL — ABNORMAL HIGH (ref 6–20)
CALCIUM: 8.9 mg/dL (ref 8.9–10.3)
CHLORIDE: 102 mmol/L (ref 101–111)
CO2: 25 mmol/L (ref 22–32)
CO2: 21 mmol/L — AB (ref 22–32)
Calcium: 8 mg/dL — ABNORMAL LOW (ref 8.9–10.3)
Chloride: 100 mmol/L — ABNORMAL LOW (ref 101–111)
Creatinine, Ser: 1.73 mg/dL — ABNORMAL HIGH (ref 0.61–1.24)
Creatinine, Ser: 1.7 mg/dL — ABNORMAL HIGH (ref 0.61–1.24)
GFR calc Af Amer: 44 mL/min — ABNORMAL LOW (ref >60)
GFR calc non Af Amer: 38 mL/min — ABNORMAL LOW (ref >60)
GFR, EST AFRICAN AMERICAN: 43 mL/min — AB (ref >60)
GFR, EST NON AFRICAN AMERICAN: 37 mL/min — AB (ref >60)
GLUCOSE: 137 mg/dL — AB (ref 65–99)
Glucose, Bld: 131 mg/dL — ABNORMAL HIGH (ref 65–99)
POTASSIUM: 4.1 mmol/L (ref 3.5–5.1)
Potassium: 4.5 mmol/L (ref 3.5–5.1)
SODIUM: 133 mmol/L — AB (ref 135–145)
Sodium: 134 mmol/L — ABNORMAL LOW (ref 135–145)
TOTAL PROTEIN: 6.8 g/dL (ref 6.5–8.1)
Total Bilirubin: 1.6 mg/dL — ABNORMAL HIGH (ref 0.3–1.2)
Total Protein: 4.7 g/dL — ABNORMAL LOW (ref 6.5–8.1)

## 2016-08-27 LAB — CBC
HCT: 35.3 % — ABNORMAL LOW (ref 39.0–52.0)
HCT: 30.1 % — ABNORMAL LOW (ref 39.0–52.0)
HEMOGLOBIN: 9.7 g/dL — AB (ref 13.0–17.0)
Hemoglobin: 12 g/dL — ABNORMAL LOW (ref 13.0–17.0)
MCH: 28.2 pg (ref 26.0–34.0)
MCH: 27.7 pg (ref 26.0–34.0)
MCHC: 34 g/dL (ref 30.0–36.0)
MCHC: 32.2 g/dL (ref 30.0–36.0)
MCV: 82.9 fL (ref 78.0–100.0)
MCV: 86 fL (ref 78.0–100.0)
Platelets: 263 10*3/uL (ref 150–400)
Platelets: 302 10*3/uL (ref 150–400)
RBC: 4.26 MIL/uL (ref 4.22–5.81)
RBC: 3.5 MIL/uL — ABNORMAL LOW (ref 4.22–5.81)
RDW: 13.2 % (ref 11.5–15.5)
RDW: 13.7 % (ref 11.5–15.5)
WBC: 15.3 10*3/uL — AB (ref 4.0–10.5)
WBC: 18.7 10*3/uL — ABNORMAL HIGH (ref 4.0–10.5)

## 2016-08-27 LAB — URINALYSIS, ROUTINE W REFLEX MICROSCOPIC
BILIRUBIN URINE: NEGATIVE
Glucose, UA: NEGATIVE mg/dL
Hgb urine dipstick: NEGATIVE
KETONES UR: NEGATIVE mg/dL
LEUKOCYTES UA: NEGATIVE
NITRITE: NEGATIVE
Protein, ur: NEGATIVE mg/dL
SPECIFIC GRAVITY, URINE: 1.017 (ref 1.005–1.030)
pH: 5 (ref 5.0–8.0)

## 2016-08-27 LAB — I-STAT TROPONIN, ED: Troponin i, poc: 0 ng/mL (ref 0.00–0.08)

## 2016-08-27 LAB — EXPECTORATED SPUTUM ASSESSMENT W GRAM STAIN, RFLX TO RESP C: Special Requests: NORMAL

## 2016-08-27 LAB — LIPASE, BLOOD: Lipase: 72 U/L — ABNORMAL HIGH (ref 11–51)

## 2016-08-27 LAB — EXPECTORATED SPUTUM ASSESSMENT W REFEX TO RESP CULTURE

## 2016-08-27 SURGERY — LAPAROTOMY, EXPLORATORY
Anesthesia: General

## 2016-08-27 MED ORDER — BUPIVACAINE LIPOSOME 1.3 % IJ SUSP
20.0000 mL | Freq: Once | INTRAMUSCULAR | Status: AC
  Administered 2016-08-27: 20 mL
  Filled 2016-08-27: qty 20

## 2016-08-27 MED ORDER — PHENYLEPHRINE HCL 10 MG/ML IJ SOLN
INTRAVENOUS | Status: DC | PRN
  Administered 2016-08-27: 25 ug/min via INTRAVENOUS

## 2016-08-27 MED ORDER — FENTANYL CITRATE (PF) 100 MCG/2ML IJ SOLN
INTRAMUSCULAR | Status: AC
  Filled 2016-08-27: qty 2

## 2016-08-27 MED ORDER — SODIUM CHLORIDE 0.9 % IV BOLUS (SEPSIS)
500.0000 mL | Freq: Once | INTRAVENOUS | Status: AC
  Administered 2016-08-27: 500 mL via INTRAVENOUS

## 2016-08-27 MED ORDER — FAMOTIDINE IN NACL 20-0.9 MG/50ML-% IV SOLN
20.0000 mg | INTRAVENOUS | Status: DC
  Administered 2016-08-28 – 2016-08-30 (×3): 20 mg via INTRAVENOUS
  Filled 2016-08-27 (×3): qty 50

## 2016-08-27 MED ORDER — SODIUM CHLORIDE 0.9 % IJ SOLN
INTRAMUSCULAR | Status: AC
  Filled 2016-08-27: qty 30

## 2016-08-27 MED ORDER — LACTATED RINGERS IV SOLN
INTRAVENOUS | Status: DC | PRN
  Administered 2016-08-27 (×4): via INTRAVENOUS

## 2016-08-27 MED ORDER — BUPIVACAINE HCL (PF) 0.25 % IJ SOLN
INTRAMUSCULAR | Status: AC
  Filled 2016-08-27: qty 30

## 2016-08-27 MED ORDER — SUGAMMADEX SODIUM 200 MG/2ML IV SOLN
INTRAVENOUS | Status: DC | PRN
  Administered 2016-08-27: 200 mg via INTRAVENOUS

## 2016-08-27 MED ORDER — MIDAZOLAM HCL 2 MG/2ML IJ SOLN
INTRAMUSCULAR | Status: AC
  Filled 2016-08-27: qty 2

## 2016-08-27 MED ORDER — PROPOFOL 10 MG/ML IV BOLUS
INTRAVENOUS | Status: AC
  Filled 2016-08-27: qty 20

## 2016-08-27 MED ORDER — SUCCINYLCHOLINE CHLORIDE 200 MG/10ML IV SOSY
PREFILLED_SYRINGE | INTRAVENOUS | Status: DC | PRN
  Administered 2016-08-27: 100 mg via INTRAVENOUS

## 2016-08-27 MED ORDER — FENTANYL CITRATE (PF) 100 MCG/2ML IJ SOLN
INTRAMUSCULAR | Status: DC | PRN
  Administered 2016-08-27 (×4): 50 ug via INTRAVENOUS

## 2016-08-27 MED ORDER — ONDANSETRON HCL 4 MG/2ML IJ SOLN
4.0000 mg | Freq: Three times a day (TID) | INTRAMUSCULAR | Status: DC | PRN
  Administered 2016-08-28 – 2016-09-01 (×3): 4 mg via INTRAVENOUS
  Filled 2016-08-27 (×3): qty 2

## 2016-08-27 MED ORDER — HYDROMORPHONE HCL 1 MG/ML IJ SOLN
INTRAMUSCULAR | Status: AC
  Filled 2016-08-27: qty 1

## 2016-08-27 MED ORDER — ROCURONIUM BROMIDE 10 MG/ML (PF) SYRINGE
PREFILLED_SYRINGE | INTRAVENOUS | Status: DC | PRN
  Administered 2016-08-27 (×3): 10 mg via INTRAVENOUS
  Administered 2016-08-27: 50 mg via INTRAVENOUS

## 2016-08-27 MED ORDER — PIPERACILLIN-TAZOBACTAM 3.375 G IVPB 30 MIN
3.3750 g | Freq: Once | INTRAVENOUS | Status: AC
  Administered 2016-08-27: 3.375 g via INTRAVENOUS
  Filled 2016-08-27: qty 50

## 2016-08-27 MED ORDER — HYDROMORPHONE HCL 2 MG/ML IJ SOLN
INTRAMUSCULAR | Status: AC
  Filled 2016-08-27: qty 1

## 2016-08-27 MED ORDER — FENTANYL CITRATE (PF) 100 MCG/2ML IJ SOLN
12.5000 ug | INTRAMUSCULAR | Status: DC | PRN
  Administered 2016-08-28 (×4): 75 ug via INTRAVENOUS
  Administered 2016-08-28: 50 ug via INTRAVENOUS
  Administered 2016-08-28 – 2016-08-30 (×17): 75 ug via INTRAVENOUS
  Administered 2016-09-03 – 2016-09-09 (×4): 12.5 ug via INTRAVENOUS
  Filled 2016-08-27 (×26): qty 2

## 2016-08-27 MED ORDER — PIPERACILLIN-TAZOBACTAM 3.375 G IVPB
INTRAVENOUS | Status: AC
  Filled 2016-08-27: qty 50

## 2016-08-27 MED ORDER — HYDROMORPHONE HCL 1 MG/ML IJ SOLN
0.2500 mg | INTRAMUSCULAR | Status: DC | PRN
  Administered 2016-08-27 (×2): 0.5 mg via INTRAVENOUS

## 2016-08-27 MED ORDER — HYDROCORTISONE NA SUCCINATE PF 100 MG IJ SOLR
50.0000 mg | Freq: Four times a day (QID) | INTRAMUSCULAR | Status: DC
  Administered 2016-08-28 (×2): 50 mg via INTRAVENOUS
  Filled 2016-08-27 (×2): qty 2

## 2016-08-27 MED ORDER — ALBUMIN HUMAN 5 % IV SOLN
INTRAVENOUS | Status: AC
  Filled 2016-08-27: qty 500

## 2016-08-27 MED ORDER — DEXTROSE-NACL 5-0.9 % IV SOLN
INTRAVENOUS | Status: AC
  Administered 2016-08-28 (×2): via INTRAVENOUS

## 2016-08-27 MED ORDER — LACTATED RINGERS IV SOLN
INTRAVENOUS | Status: DC
  Administered 2016-08-27: 17:00:00 via INTRAVENOUS

## 2016-08-27 MED ORDER — LIDOCAINE 2% (20 MG/ML) 5 ML SYRINGE
INTRAMUSCULAR | Status: DC | PRN
  Administered 2016-08-27: 100 mg via INTRAVENOUS

## 2016-08-27 MED ORDER — ATROPINE SULFATE 0.4 MG/ML IJ SOLN
INTRAMUSCULAR | Status: AC
  Filled 2016-08-27: qty 1

## 2016-08-27 MED ORDER — SUGAMMADEX SODIUM 200 MG/2ML IV SOLN
INTRAVENOUS | Status: AC
  Filled 2016-08-27: qty 2

## 2016-08-27 MED ORDER — SODIUM CHLORIDE 0.9 % IV BOLUS (SEPSIS): 500.0000 mL | Freq: Once | INTRAVENOUS | Status: AC

## 2016-08-27 MED ORDER — FENTANYL CITRATE (PF) 100 MCG/2ML IJ SOLN
50.0000 ug | Freq: Once | INTRAMUSCULAR | Status: AC
  Administered 2016-08-27: 50 ug via INTRAVENOUS
  Filled 2016-08-27: qty 2

## 2016-08-27 MED ORDER — PROMETHAZINE HCL 25 MG/ML IJ SOLN: 6.2500 mg | INTRAMUSCULAR | Status: DC | PRN

## 2016-08-27 MED ORDER — HYDROMORPHONE HCL 1 MG/ML IJ SOLN
INTRAMUSCULAR | Status: DC | PRN
  Administered 2016-08-27 (×4): 0.5 mg via INTRAVENOUS

## 2016-08-27 MED ORDER — SODIUM CHLORIDE 0.9 % IR SOLN
Status: DC | PRN
  Administered 2016-08-27: 10000 mL

## 2016-08-27 MED ORDER — ONDANSETRON HCL 4 MG/2ML IJ SOLN
4.0000 mg | Freq: Once | INTRAMUSCULAR | Status: AC
  Administered 2016-08-27: 4 mg via INTRAVENOUS
  Filled 2016-08-27: qty 2

## 2016-08-27 MED ORDER — MEPERIDINE HCL 50 MG/ML IJ SOLN: 6.2500 mg | INTRAMUSCULAR | Status: DC | PRN

## 2016-08-27 MED ORDER — SODIUM CHLORIDE 0.9 % IV SOLN
30.0000 ug/min | INTRAVENOUS | Status: DC
  Administered 2016-08-28: 30 ug/min via INTRAVENOUS
  Filled 2016-08-27 (×2): qty 1

## 2016-08-27 MED ORDER — PROPOFOL 10 MG/ML IV BOLUS
INTRAVENOUS | Status: DC | PRN
  Administered 2016-08-27: 140 mg via INTRAVENOUS

## 2016-08-27 MED ORDER — ONDANSETRON HCL 4 MG/2ML IJ SOLN
INTRAMUSCULAR | Status: AC
Start: 2016-08-27 — End: 2016-08-27
  Filled 2016-08-27: qty 2

## 2016-08-27 MED ORDER — ALBUMIN HUMAN 5 % IV SOLN
INTRAVENOUS | Status: DC | PRN
  Administered 2016-08-27 (×2): via INTRAVENOUS

## 2016-08-27 MED ORDER — EPINEPHRINE PF 1 MG/ML IJ SOLN
INTRAMUSCULAR | Status: AC
  Filled 2016-08-27: qty 1

## 2016-08-27 MED ORDER — IOPAMIDOL (ISOVUE-300) INJECTION 61%
30.0000 mL | Freq: Once | INTRAVENOUS | Status: AC
  Administered 2016-08-27: 30 mL via ORAL

## 2016-08-27 MED ORDER — SODIUM CHLORIDE 0.9 % IV BOLUS (SEPSIS)
500.0000 mL | Freq: Once | INTRAVENOUS | Status: AC
  Administered 2016-08-28: 500 mL via INTRAVENOUS

## 2016-08-27 MED ORDER — LACTATED RINGERS IV SOLN
INTRAVENOUS | Status: DC | PRN
  Administered 2016-08-27 (×2): via INTRAVENOUS

## 2016-08-27 MED ORDER — BUPIVACAINE HCL 0.25 % IJ SOLN
INTRAMUSCULAR | Status: DC | PRN
  Administered 2016-08-27: 10 mL

## 2016-08-27 MED ORDER — ONDANSETRON HCL 4 MG/2ML IJ SOLN
INTRAMUSCULAR | Status: DC | PRN
  Administered 2016-08-27: 4 mg via INTRAVENOUS

## 2016-08-27 MED ORDER — KCL IN DEXTROSE-NACL 20-5-0.45 MEQ/L-%-% IV SOLN
INTRAVENOUS | Status: DC
  Administered 2016-08-27: 23:00:00 via INTRAVENOUS
  Filled 2016-08-27: qty 1000

## 2016-08-27 SURGICAL SUPPLY — 51 items
APPLICATOR COTTON TIP 6IN STRL (MISCELLANEOUS) ×2 IMPLANT
BLADE EXTENDED COATED 6.5IN (ELECTRODE) ×1 IMPLANT
BLADE HEX COATED 2.75 (ELECTRODE) ×2 IMPLANT
COVER MAYO STAND STRL (DRAPES) ×1 IMPLANT
COVER SURGICAL LIGHT HANDLE (MISCELLANEOUS) ×3 IMPLANT
DRAIN CHANNEL 19F RND (DRAIN) ×1 IMPLANT
DRAIN PENROSE 18X1/2 LTX STRL (DRAIN) ×1 IMPLANT
DRAPE LAPAROSCOPIC ABDOMINAL (DRAPES) ×2 IMPLANT
DRAPE WARM FLUID 44X44 (DRAPE) ×1 IMPLANT
ELECT REM PT RETURN 9FT ADLT (ELECTROSURGICAL) ×2
ELECTRODE REM PT RTRN 9FT ADLT (ELECTROSURGICAL) ×1 IMPLANT
EVACUATOR DRAINAGE 10X20 100CC (DRAIN) IMPLANT
EVACUATOR SILICONE 100CC (DRAIN) ×2
GAUZE SPONGE 4X4 12PLY STRL (GAUZE/BANDAGES/DRESSINGS) ×2 IMPLANT
GLOVE BIO SURGEON STRL SZ 6.5 (GLOVE) ×1 IMPLANT
GLOVE BIOGEL PI IND STRL 7.0 (GLOVE) ×1 IMPLANT
GLOVE BIOGEL PI INDICATOR 7.0 (GLOVE) ×1
GLOVE INDICATOR 6.5 STRL GRN (GLOVE) ×3 IMPLANT
GLOVE SURG SIGNA 7.5 PF LTX (GLOVE) ×2 IMPLANT
GOWN SPEC L4 XLG W/TWL (GOWN DISPOSABLE) ×3 IMPLANT
GOWN STRL REUS W/TWL LRG LVL3 (GOWN DISPOSABLE) ×2 IMPLANT
HANDLE SUCTION POOLE (INSTRUMENTS) IMPLANT
KIT BASIN OR (CUSTOM PROCEDURE TRAY) ×2 IMPLANT
LIGASURE IMPACT 36 18CM CVD LR (INSTRUMENTS) ×1 IMPLANT
PACK GENERAL/GYN (CUSTOM PROCEDURE TRAY) ×2 IMPLANT
RELOAD STAPLE 60 4.1 GRN THCK (STAPLE) IMPLANT
RELOAD STAPLER GREEN 60MM (STAPLE) ×2 IMPLANT
SPONGE LAP 18X18 X RAY DECT (DISPOSABLE) ×8 IMPLANT
STAPLER ECHELON LONG 60 440 (INSTRUMENTS) ×1 IMPLANT
STAPLER RELOAD GREEN 60MM (STAPLE) ×4
STAPLER VISISTAT 35W (STAPLE) ×1 IMPLANT
SUCTION POOLE HANDLE (INSTRUMENTS)
SUT ETHILON 2 0 PS N (SUTURE) ×1 IMPLANT
SUT NOVA 1 T20/GS 25DT (SUTURE) ×3 IMPLANT
SUT PDS AB 1 CTX 36 (SUTURE) IMPLANT
SUT PROLENE 2 0 SH DA (SUTURE) ×1 IMPLANT
SUT SILK 2 0 (SUTURE) ×2
SUT SILK 2 0 SH CR/8 (SUTURE) ×1 IMPLANT
SUT SILK 2-0 18XBRD TIE 12 (SUTURE) IMPLANT
SUT SILK 3 0 (SUTURE) ×2
SUT SILK 3 0 SH CR/8 (SUTURE) ×1 IMPLANT
SUT SILK 3-0 18XBRD TIE 12 (SUTURE) IMPLANT
SUT VIC AB 2-0 SH 18 (SUTURE) ×2 IMPLANT
SUT VIC AB 3-0 SH 18 (SUTURE) ×1 IMPLANT
SUT VICRYL 2 0 18  UND BR (SUTURE)
SUT VICRYL 2 0 18 UND BR (SUTURE) IMPLANT
SWAB COLLECTION DEVICE MRSA (MISCELLANEOUS) ×1 IMPLANT
SWAB CULTURE ESWAB REG 1ML (MISCELLANEOUS) ×1 IMPLANT
TOWEL OR 17X26 10 PK STRL BLUE (TOWEL DISPOSABLE) ×4 IMPLANT
TRAY FOLEY W/METER SILVER 16FR (SET/KITS/TRAYS/PACK) ×1 IMPLANT
YANKAUER SUCT BULB TIP NO VENT (SUCTIONS) IMPLANT

## 2016-08-27 NOTE — ED Triage Notes (Signed)
Pt sent here from PCP for complication from abd surgery 2 weeks ago. Went to PCP for his cough and was sent here for free abd air on x ray. Small amount emesis. No diarrhea.

## 2016-08-27 NOTE — Anesthesia Procedure Notes (Signed)
Procedure Name: Intubation Date/Time: 08/27/2016 4:29 PM Performed by: Cynda Familia Pre-anesthesia Checklist: Patient identified, Emergency Drugs available, Suction available and Patient being monitored Patient Re-evaluated:Patient Re-evaluated prior to inductionOxygen Delivery Method: Circle System Utilized Preoxygenation: Pre-oxygenation with 100% oxygen Intubation Type: IV induction Ventilation: Mask ventilation without difficulty Laryngoscope Size: Miller and 2 Grade View: Grade I Tube type: Subglottic suction tube Number of attempts: 1 Airway Equipment and Method: Stylet Placement Confirmation: ETT inserted through vocal cords under direct vision,  positive ETCO2 and breath sounds checked- equal and bilateral Tube secured with: Tape Dental Injury: Teeth and Oropharynx as per pre-operative assessment  Comments: Smooth RSI by Lissa Hoard--- intubation AM CRNA atraumatic--- teeth and mouth as preop---very poor dentition--  Many teeth broken off esp on bottom and right upper---teeth and mouth as preop   After largngoscopy--- bilat BS [place]

## 2016-08-27 NOTE — Anesthesia Preprocedure Evaluation (Addendum)
Anesthesia Evaluation  Patient identified by MRN, date of birth, ID band Patient awake    Reviewed: Allergy & Precautions, NPO status , Patient's Chart, lab work & pertinent test results  Airway Mallampati: II  TM Distance: >3 FB Neck ROM: Full    Dental no notable dental hx. (+) Dental Advisory Given, Poor Dentition, Chipped Many chipped teeth right front and most of lower all broken off:   Pulmonary pneumonia, former smoker,    Pulmonary exam normal breath sounds clear to auscultation       Cardiovascular hypertension, Pt. on medications + CAD and + Peripheral Vascular Disease  Normal cardiovascular exam+ Valvular Problems/Murmurs AI  Rhythm:Regular Rate:Normal  TEE 05/29/2016 - Left ventricle: The cavity size was normal. Wall thickness was normal. Systolic function was mildly reduced. The estimated ejection fraction was in the range of 45% to 50%. Diffuse hypokinesis. - Aortic valve: There was mild to moderate regurgitation directed centrally in the LVOT and towards the mitral anterior leaflet. - Aortic root: The aortic root was moderately dilated. - Ascending aorta: The ascending aorta was moderately dilated. - Mitral valve: No evidence of vegetation. - Left atrium: The atrium was dilated. No evidence of thrombus in  the atrial cavity or appendage. No spontaneous echo contrast was observed. - Right atrium: The atrium was dilated. - Atrial septum: No defect or patent foramen ovale was identified. - Tricuspid valve: No evidence of vegetation. - Pulmonic valve: No evidence of vegetation.   Neuro/Psych PSYCHIATRIC DISORDERS Anxiety Depression negative neurological ROS     GI/Hepatic Neg liver ROS, hiatal hernia, GERD  ,(+) Hepatitis -  Endo/Other  negative endocrine ROS  Renal/GU Renal diseasenegative Renal ROS     Musculoskeletal negative musculoskeletal ROS (+)   Abdominal   Peds  Hematology negative hematology  ROS (+) anemia ,   Anesthesia Other Findings   Reproductive/Obstetrics negative OB ROS                                                            Anesthesia Evaluation  Patient identified by MRN, date of birth, ID band Patient awake    Reviewed: Allergy & Precautions, NPO status , Patient's Chart, lab work & pertinent test results  Airway Mallampati: II  TM Distance: >3 FB     Dental   Pulmonary pneumonia, former smoker,    breath sounds clear to auscultation       Cardiovascular hypertension, + CAD and + Peripheral Vascular Disease  + Valvular Problems/Murmurs  Rhythm:Regular Rate:Normal  Echo 10.2017 - Left ventricle: The cavity size was normal. There was moderate concentric hypertrophy. Systolic function was mildly reduced. The estimated ejection fraction was in the range of 45% to 50%. Wall motion was normal; there were no regional wall motion abnormalities. - Aortic valve: There was mild regurgitation. - Aorta: Aortic root dimension: 51 mm (ED). Ascending aorta diameter: 47 mm (ED). - Aortic root: The aortic root was moderately dilated. - Ascending aorta: The ascending aorta was moderately dilated. - Mitral valve: Calcified annulus.   Neuro/Psych PSYCHIATRIC DISORDERS Anxiety Depression  Neuromuscular disease    GI/Hepatic hiatal hernia, GERD  ,(+) Hepatitis -  Endo/Other    Renal/GU Renal disease     Musculoskeletal  (+) Arthritis ,   Abdominal   Peds  Hematology  (+)  anemia ,   Anesthesia Other Findings   Reproductive/Obstetrics                            Anesthesia Physical  Anesthesia Plan  ASA: III and emergent  Anesthesia Plan: General   Post-op Pain Management:    Induction: Intravenous  Airway Management Planned: Oral ETT  Additional Equipment:   Intra-op Plan:   Post-operative Plan: Possible Post-op intubation/ventilation  Informed Consent: I have reviewed the patients  History and Physical, chart, labs and discussed the procedure including the risks, benefits and alternatives for the proposed anesthesia with the patient or authorized representative who has indicated his/her understanding and acceptance.   Dental advisory given  Plan Discussed with: CRNA  Anesthesia Plan Comments:        Anesthesia Quick Evaluation                                   Anesthesia Evaluation  Patient identified by MRN, date of birth, ID band Patient awake    Reviewed: Allergy & Precautions, NPO status , Patient's Chart, lab work & pertinent test results  Airway Mallampati: II  TM Distance: >3 FB     Dental   Pulmonary pneumonia, former smoker,    breath sounds clear to auscultation       Cardiovascular hypertension, + CAD and + Peripheral Vascular Disease   Rhythm:Regular Rate:Normal     Neuro/Psych    GI/Hepatic hiatal hernia, GERD  ,(+) Hepatitis -  Endo/Other    Renal/GU Renal disease     Musculoskeletal  (+) Arthritis ,   Abdominal   Peds  Hematology   Anesthesia Other Findings   Reproductive/Obstetrics                            Anesthesia Physical Anesthesia Plan  ASA: III  Anesthesia Plan: General   Post-op Pain Management:    Induction: Intravenous  Airway Management Planned: Oral ETT  Additional Equipment:   Intra-op Plan:   Post-operative Plan: Possible Post-op intubation/ventilation  Informed Consent: I have reviewed the patients History and Physical, chart, labs and discussed the procedure including the risks, benefits and alternatives for the proposed anesthesia with the patient or authorized representative who has indicated his/her understanding and acceptance.   Dental advisory given  Plan Discussed with: CRNA and Anesthesiologist  Anesthesia Plan Comments:         Anesthesia Quick Evaluation  Anesthesia Physical Anesthesia Plan  ASA: IV and  emergent  Anesthesia Plan: General   Post-op Pain Management:    Induction: Intravenous, Rapid sequence and Cricoid pressure planned  Airway Management Planned: Oral ETT  Additional Equipment:   Intra-op Plan:   Post-operative Plan: Possible Post-op intubation/ventilation  Informed Consent: I have reviewed the patients History and Physical, chart, labs and discussed the procedure including the risks, benefits and alternatives for the proposed anesthesia with the patient or authorized representative who has indicated his/her understanding and acceptance.   Dental advisory given  Plan Discussed with: CRNA  Anesthesia Plan Comments:         Anesthesia Quick Evaluation
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
## 2016-08-27 NOTE — Anesthesia Procedure Notes (Signed)
Central Venous Catheter Insertion Performed by: Nolon Nations, anesthesiologist Start/End1/25/2018 8:10 PM, 08/27/2016 8:25 PM Patient location: OR. Preanesthetic checklist: patient identified, IV checked, site marked, risks and benefits discussed, surgical consent, monitors and equipment checked, pre-op evaluation, timeout performed and anesthesia consent Position: Trendelenburg Lidocaine 1% used for infiltration and patient sedated Hand hygiene performed , maximum sterile barriers used  and Seldinger technique used Catheter size: 8 Fr Total catheter length 16. Central line was placed.Double lumen Procedure performed using ultrasound guided technique. Ultrasound Notes:anatomy identified, needle tip was noted to be adjacent to the nerve/plexus identified, no ultrasound evidence of intravascular and/or intraneural injection and image(s) printed for medical record Attempts: 1 Following insertion, dressing applied, line sutured and Biopatch. Post procedure assessment: blood return through all ports, free fluid flow and no air  Patient tolerated the procedure well with no immediate complications.

## 2016-08-27 NOTE — Progress Notes (Signed)
[facility] Progress Note Patient Name: Derek [name] DOB: November 20, 1940 MRN: [id number]   Date of Service  08/27/2016  HPI/Events of Note  Low bp post op   Intake/Output Summary (Last 24 hours) at 08/27/16 2246 Last data filed at 08/27/16 2100  Gross per 24 hour  Intake             4850 ml  Output              835 ml  Net             4015 ml       eICU Interventions  Ns x 500 cc  Check cvp         Christinia Gully 08/27/2016, 10:46 PM

## 2016-08-27 NOTE — Op Note (Signed)
08/27/2016  8:18 PM  PATIENT:  Derek [name], 76 y.o., male, MRN: [id number]  PREOP DIAGNOSIS:  Pneumoperitoneum, probable bowel leak  POSTOP DIAGNOSIS:   Infarcted right transverse colon, extensive intra-abdominal adhesions involving distal half of small bowel and what was left of right colon.  PROCEDURE:   Procedure(s): EXPLORATORYLAPAROTOMY, LYSIS OF ADHESIONS (for 90 minutes), RIGHT COLECTOMY with end ileostomy and Hartmann's pouch (at left transverse colon) [drawing of surgery at end of this note]  SURGEON:   [name], M.D.  Terrence DupontRockne Coons, M.D.  ANESTHESIA:   general  Anesthesiologist: Nolon Nations, MD CRNA: Cynda Familia, CRNA; Sharlette Dense, CRNA  General  EBL:  400  ml  BLOOD ADMINISTERED: none  DRAINS: 19 F Blake drain in RUQ  LOCAL MEDICATIONS USED:   15 cc of Exparel mixed with 15 cc of 1/4% Marcaine  SPECIMEN:   Right colon and terminal ileum  COUNTS CORRECT:  YES  INDICATIONS FOR PROCEDURE:  Derek [name] is a 76 y.o. (DOB: [birth year]) white male whose primary care physician is Purvis Kilts, MD.  He comes for abdominal exploration for pneumoperitoneum.   He had a resection of a portion of the transverse colon on 08/14/2016 by Dr. Keturah Barre. [facility] [place] for a 1.2 cm colon cancer and was discharged home on 08/21/2016.  On 08/27/2016, he was found to have a pneumoperitoneum and now comes for abdominal exploration.   The indications and risks of the surgery were explained to the patient.  The risks include, but are not limited to, infection, bleeding, and nerve injury.  PROCEDURE:  The patient was taken to room # 2 and underwent a general anesthesia.  He had a foley placed, oral gastric tube placed, and his abdomen prepped with Chloroprep.  He was given Zosyn as an antibiotic before surgery. His abdomen was sterilely dressed.   A timeout was held and surgical checklist run.   I went through his upper midline incision and accessed the abdominal  cavity. He had scar tissue from the recent surgery to the undersurface of his midline incision.  I bluntly took this down with my finger. He had feculent contents in the right upper quadrant. As I dissected out his anastomosis and right transverse colon, it was obvious this was more than a leak from his colon resection. It appeared that the right transverse colon from the anastomosis to the hepatic flexure had infarcted. This was about 10 cm of colon.  It was clear that there would be no way for me to bring up an end colostomy with this bowel.   The patient had had an ileocecectomy for Crohn's disease around 1978 that required a second operation. He had dense scar tissue of his small bowel and remaining right colon in the right mid and right lower abdomen. This obscured his old ileocolonic anastomosis.  I had mobilize this mat of bowel to decide to do either a colostomy or ileostomy. The bowel was densely adhered upon itself and involved at least the distal one half of the small bowel.  I spent at least 90 minutes doing enterolysis.  After the lengthy dissection, I was able to mobilize his right colon towards the midline. I made at least 2 enterotomies in the distal small bowel. Because of the density of the adhesions and inflammatory nature the bowel I think these enterotomies were unavoidable.    When I finally mobilized enough of the right colon, it was clear that I could not use the  colon for a colostomy and I would have to bring out an ileostomy.     I divided the left transverse colon about 5 cm beyond (to the left) of the anastomosis with the Ethicon Echelon 60 mm stapler, green load. I divided the ileum with another Ethicon Echelon 60 mm stapler. I sent the right transverse colon, right colon and terminal ileum to pathology.   The abdomen was irrigated with 8 L of saline. Hemostasis was controlled with the LigaSure, 2-0 silk sutures, and 2-0 vicryl sutures.  I think I avoided injury to the duodenum.  I could not see the gallbladder or the right lobe of the liver because of inflammation.  The spillage of stool and inflammation was primarily limited to the right upper quadrant, under the right lobe of the liver.   The small bowel that I left in the abdomen still matted together, but I created a long enough segment (about 6 cm) to bring out as an ileostomy in the right upper quadrant.  I brought a 70 F Blake drain out of the RUQ.   I closed the abdomen with interrupted #1 Novafil sutures.  I infiltrated the fascia with a 30 cc mixture of 1/4% marcaine and Exparel.  I did not close the skin and packed it with saline damp Kerlix gauze.  I matured the ileostomy with 3-0 Vicryl sutures.   Sponge and needle correct at the end of the case. He was transported to recovery room in good condition. He'll be placed in the ICU overnight for observation.    Drawing of surgery   [name], MD, [facility] Surgery Pager: [phone] Office phone:  [phone]

## 2016-08-27 NOTE — ED Provider Notes (Signed)
[facility] DEPT Provider Note   CSN: [id number] Arrival date & time: 08/27/16  1017     History   Chief Complaint Chief Complaint  Patient presents with  . Abdominal Pain    HPI Derek [name] is a 76 y.o. male.  The history is provided by the patient. No language interpreter was used.  Abdominal Pain      Derek [name] is a 76 y.o. male who presents to the Emergency Department complaining of abdominal pain.  He presents from his primary care physician's office today for evaluation of abdominal pain and free air on chest x-ray. He had a bowel resection on January 12. He was discharged after the surgery with no difficulties. About a week ago he developed some nasal congestion, cough, nausea. Symptoms worsened significantly yesterday with generalized malaise, epigastric pain, vomiting. No fevers or diarrhea. He endorses associated shortness of breath.  Past Medical History:  Diagnosis Date  . Anemia   . Anxiety   . Aortic insufficiency    a. mild-mod by echo 09/2015.  . Arthritis    "knees; left shoulder" (09/21/2013)  . Ascending aortic aneurysm (HCC)    a. last measurement 5.3 cm 03/2016 -> f/u planned 09/2015 to continue to follow.  . Atrial fibrillation ([place])   . B12 deficiency    takes Vit 12 shot every 14days   . Cataract   . CKD (chronic kidney disease) stage 3, GFR 30-59 ml/min 08/22/2011  . Clotting disorder ([place])   . Crohn's disease ([place])   . Depression   . Enlarged prostate   . Enteric hyperoxaluria ([place]) 02/21/2016  . GERD (gastroesophageal reflux disease)    takes Omeprazole daily  . Gout    takes Uloric and Colchicine daily  . Heart murmur   . Hepatitis C 1978   negtive RNA load - spontaneously cleared  . Hiatal hernia   . History of blood transfusion 1978; 1990's; ?   "w/bowel resection; S/P allupurinol; ?" (09/21/2013)  . History of colon polyps   . History of kidney stones   . History of MRSA infection 2010  . History of pulmonary embolism  2006   both legs and both lungs /notes 08/26/2008 (09/21/2013)  . History of small bowel obstruction   . History of staph infection 1978  . Hyperoxaluria (HCC)    Intestinal  . Hypertension   . Insomnia    takes Trazodone nightly  . Internal hemorrhoids   . LV dysfunction    a. h/o EF 45-50% in 2015, normalized on subsequent echoes.  . Nephrolithiasis   . Pancreatitis 2010   elevated lipase and amylase, stranding in tail of pancreas, ? from Humira  . Pancytopenia    Hx of  . Peripheral neuropathy (HCC)    takes Gabapentin daily  . Pneumonia    hx of   . Post-traumatic stress syndrome    takes Paxil nightly  . PTSD (post-traumatic stress disorder)   . Pulmonary nodule    a. 44m by CT 05/2015, recommended f/u 6-12 months.  . RLS (restless legs syndrome)   . Rosacea conjunctivitis(372.31)    takes Minocin daily  . Secondary hyperparathyroidism ([place] 02/21/2016  . Sinus bradycardia   . Skin cancer    "cut/burned off left ear and face" (09/21/2013)  . Small bowel obstruction   . Thrombocytopenia ([place]    hx of    Patient Active Problem List   Diagnosis Date Noted  . Chronic anticoagulation 08/18/2016  . Sinus bradycardia 08/17/2016  .  S/P colon resection 08/14/2016  . Adenomatous polyp of transverse colon s/p partial colectomy 08/14/2016 08/14/2016  . Infectious diarrhea   . Chronic diarrhea   . Essential hypertension 05/27/2016  . Hypotension 05/27/2016  . Hypomagnesemia 05/27/2016  . Peripheral neuropathic pain 05/26/2016  . GERD (gastroesophageal reflux disease) 05/26/2016  . PAF (paroxysmal atrial fibrillation) ([place]) 05/26/2016  . Lung nodule 05/12/2016  . SBO (small bowel obstruction) 05/11/2016  . Enteric hyperoxaluria ([place]) 02/21/2016  . Secondary hyperparathyroidism ([place]) 02/21/2016  . Pain in joint, shoulder region 10/12/2013  . Decreased range of motion of left shoulder 10/12/2013  . Muscle weakness (generalized) 10/12/2013  . Restriction of joint motion  10/12/2013  . S/P shoulder replacement 09/21/2013  . Cardiomyopathy ([place]) 09/07/2013  . Anxiety 08/22/2011  . CKD (chronic kidney disease) stage 3, GFR 30-59 ml/min 08/22/2011  . Thrombocytopenia ([place]) 08/22/2011  . CORONARY ATHEROSCLEROSIS NATIVE CORONARY ARTERY 06/25/2010  . Ascending aortic aneurysm ([place]) 06/26/2009  . Crohn's ileocolitis ([place]) 06/26/2009  . B12 DEFICIENCY 05/09/2008  . Gout 05/09/2008  . POST TRAUMATIC STRESS SYNDROME 05/09/2008  . GERD 05/09/2008  . HEPATITIS C Ab positive RNA neg, HX OF 05/09/2008  . PULMONARY EMBOLISM, HX OF 05/09/2008  . GASTRITIS, HX OF 05/09/2008    Past Surgical History:  Procedure Laterality Date  . ANKLE SURGERY Right   . APPENDECTOMY  1978  . BOWEL RESECTION  1978 X 2  . CARDIOVERSION N/A 05/29/2016   Procedure: CARDIOVERSION;  Surgeon: Sanda Klein, MD;  Location: [facility];  Service: Cardiovascular;  Laterality: N/A;  . CHOLECYSTECTOMY    . COLON RESECTION N/A 08/14/2016   Procedure: LAPAROSCOPIC RESECTION TRANSVERSE COLON;  Surgeon: [name], MD;  Location: [facility] ORS;  Service: General;  Laterality: N/A;  . COLON SURGERY    . COLONOSCOPY    . ESOPHAGOGASTRODUODENOSCOPY    . EYE SURGERY     cataract surgery bilateral  . FOOT SURGERY Right    "took gout out"  . HEMICOLECTOMY Right   . ILEOCECETOMY  1978   [name] 05/10/2000  (09/21/2013)  . INGUINAL HERNIA REPAIR Right   . KNEE ARTHROSCOPY Left   . LIGAMENT REPAIR Left   . TEE WITHOUT CARDIOVERSION N/A 05/29/2016   Procedure: TRANSESOPHAGEAL ECHOCARDIOGRAM (TEE);  Surgeon: Sanda Klein, MD;  Location: [facility];  Service: Cardiovascular;  Laterality: N/A;  . TOTAL SHOULDER ARTHROPLASTY Left 09/21/2013  . TOTAL SHOULDER ARTHROPLASTY Left 09/21/2013   Procedure: LEFT TOTAL SHOULDER ARTHROPLASTY;  Surgeon: Marin Shutter, MD;  Location: [facility];  Service: Orthopedics;  Laterality: Left;       Home Medications    Prior to Admission medications   Medication Sig Start  Date End Date Taking? Authorizing Provider  Acidophilus Lactobacillus CAPS Take 1 capsule by mouth daily. Patient taking differently: Take 1 capsule by mouth at bedtime.  06/08/16  Yes [name], MD  amiodarone (PACERONE) 200 MG tablet Take 1 tablet (200 mg total) by mouth 2 (two) times daily. 07/02/16  Yes Brittainy Erie Noe, PA-C  calcium carbonate (TUMS - DOSED IN MG ELEMENTAL CALCIUM) 500 MG chewable tablet Chew 2 tablets by mouth 2 (two) times daily as needed.    Yes Historical Provider, MD  Cholecalciferol (VITAMIN D3) 1000 units CAPS Take 1,000 Units by mouth daily.   Yes Historical Provider, MD  cyanocobalamin (,VITAMIN B-12,) 1000 MCG/ML injection Inject 1,000 mcg into the muscle every 14 (fourteen) days.   Yes Historical Provider, MD  cycloSPORINE (RESTASIS) 0.05 % ophthalmic emulsion Place 1  drop into both eyes 4 (four) times daily.   Yes Historical Provider, MD  ELIQUIS 5 MG TABS tablet Take 5 mg by mouth 2 (two) times daily. 08/10/16  Yes Historical Provider, MD  febuxostat (ULORIC) 40 MG tablet Take 80 mg by mouth daily.   Yes Historical Provider, MD  gabapentin (NEURONTIN) 100 MG capsule Take 100-200 mg by mouth 2 (two) times daily. Pt takes one tablet in the morning and two at night.   Yes Historical Provider, MD  HYDROcodone-acetaminophen (NORCO/VICODIN) 5-325 MG tablet Take 1-2 tablets by mouth every 6 (six) hours as needed for moderate pain. 08/21/16  Yes [name], MD  Magnesium Oxide 420 MG TABS Take 420 mg by mouth 2 (two) times daily.   Yes Historical Provider, MD  minocycline (MINOCIN,DYNACIN) 100 MG capsule Take 100 mg by mouth daily as needed (when breakout occurs on face).    Yes Historical Provider, MD  Multiple Vitamin (MULTIVITAMIN WITH MINERALS) TABS tablet Take 1 tablet by mouth daily.   Yes Historical Provider, MD  omeprazole (PRILOSEC) 20 MG capsule Take 20 mg by mouth daily.     Yes Historical Provider, MD  PARoxetine (PAXIL) 20 MG tablet Take 20 mg by mouth at  bedtime.   Yes Historical Provider, MD  Polyvinyl Alcohol-Povidone (REFRESH OP) Apply 1 drop to eye 2 (two) times daily.   Yes Historical Provider, MD  potassium citrate (UROCIT-K) 10 MEQ (1080 MG) SR tablet Take 10 mEq by mouth 2 (two) times daily.   Yes Historical Provider, MD  promethazine (PHENERGAN) 25 MG tablet Take 25 mg by mouth every 8 (eight) hours as needed for nausea or vomiting.  06/04/16  Yes Historical Provider, MD  terazosin (HYTRIN) 2 MG capsule Take 2 mg by mouth at bedtime.    Yes Historical Provider, MD  traZODone (DESYREL) 150 MG tablet Take 150 mg by mouth at bedtime.     Yes Historical Provider, MD  saccharomyces boulardii (FLORASTOR) 250 MG capsule Take 1 capsule (250 mg total) by mouth 2 (two) times daily. Patient not taking: Reported on 07/31/2016 06/02/16   Theodis Blaze, MD    Family History Family History  Problem Relation Age of Onset  . Kidney disease Father   . Hypertension Father   . Aneurysm Mother   . Aneurysm Sister   . Esophageal cancer Neg Hx   . Stomach cancer Neg Hx   . Rectal cancer Neg Hx     Social History Social History  Substance Use Topics  . Smoking status: Former Smoker    Packs/day: 2.00    Years: 20.00    Types: Cigarettes    Quit date: 08/04/1975  . Smokeless tobacco: Former Systems developer    Types: [place] date: 08/03/1978     Comment: 09/21/2013 "quit smoking in the late 1970's; stopped chewing couple years after I quit smoking"  . Alcohol use No     Allergies   Lorazepam and Humira [adalimumab]   Review of Systems Review of Systems  Gastrointestinal: Positive for abdominal pain.  All other systems reviewed and are negative.    Physical Exam Updated Vital Signs BP 105/66   Pulse 71   Temp 97.9 F (36.6 C) (Oral)   Resp (!) 31   SpO2 96%   Physical Exam  Constitutional: He is oriented to person, place, and time. He appears well-developed and well-nourished.  Uncomfortable appearing  HENT:  Head: Normocephalic and  atraumatic.  Cardiovascular: Normal rate and regular rhythm.  No murmur heard. Pulmonary/Chest: Effort normal. No respiratory distress.  tachypneic  Abdominal: Soft. There is no rebound and no guarding.  Mild to moderate epigastric tenderness. Multiple healing abdominal surgical incision sites. Periumbilical ecchymosis.  Musculoskeletal: He exhibits no edema or tenderness.  Neurological: He is alert and oriented to person, place, and time.  Skin: Skin is warm and dry.  Psychiatric: He has a normal mood and affect. His behavior is normal.  Nursing note and vitals reviewed.    ED Treatments / Results  Labs (all labs ordered are listed, but only abnormal results are displayed) Labs Reviewed  LIPASE, BLOOD - Abnormal; Notable for the following:       Result Value   Lipase 72 (*)    All other components within normal limits  COMPREHENSIVE METABOLIC PANEL - Abnormal; Notable for the following:    Sodium 134 (*)    Chloride 100 (*)    Glucose, Bld 131 (*)    BUN 34 (*)    Creatinine, Ser 1.73 (*)    Albumin 2.9 (*)    Alkaline Phosphatase 253 (*)    GFR calc non Af Amer 37 (*)    GFR calc Af Amer 43 (*)    All other components within normal limits  CBC - Abnormal; Notable for the following:    WBC 15.3 (*)    Hemoglobin 12.0 (*)    HCT 35.3 (*)    All other components within normal limits  CULTURE, EXPECTORATED SPUTUM-ASSESSMENT  URINALYSIS, ROUTINE W REFLEX MICROSCOPIC  I-STAT TROPOININ, ED  I-STAT CG4 LACTIC ACID, ED    EKG  EKG Interpretation None       Radiology Ct Abdomen Pelvis Wo Contrast  Result Date: 08/27/2016 CLINICAL DATA:  History of recent colon surgery 13 days ago with abdominal pain EXAM: CT ABDOMEN AND PELVIS WITHOUT CONTRAST TECHNIQUE: Multidetector CT imaging of the abdomen and pelvis was performed following the standard protocol without IV contrast. COMPARISON:  05/11/2016 FINDINGS: Lower chest: Lung bases are well aerated. Stable nodule is noted  along the major fissure on the left unchanged from the prior exam. Mild scarring is noted in the bases bilaterally. Coronary calcifications are seen. Hepatobiliary: The liver is within normal limits. The gallbladder has been surgically removed. Pancreas: Unremarkable. No pancreatic ductal dilatation or surrounding inflammatory changes. Spleen: Normal in size without focal abnormality. Adrenals/Urinary Tract: Adrenals are within normal limits bilaterally. Bilateral nonobstructing renal calculi are seen. Small renal cysts are noted bilaterally stable from the prior exam. Stomach/Bowel: Postsurgical changes are noted in the midportion of the transverse colon consistent with the given clinical history. There is a large amount of free air and apparent fecal material extrinsic to the bowel in the right mid abdomen. Considerable free air is noted as well. Changes are consistent with anastomotic breakdown. Some mild extravasated contrast is noted in the right lateral abdomen best seen on image number [id number]. Vascular/Lymphatic: Aortic atherosclerosis. No enlarged abdominal or pelvic lymph nodes. Reproductive: Prostate is unremarkable. Other: No abdominal wall hernia or abnormality. No abdominopelvic ascites. Musculoskeletal: Degenerative changes of lumbar spine are noted. IMPRESSION: Changes consistent with breakdown of recent surgical anastomosis in the right upper abdomen with spillage of both air and apparent fecal material as well as contrast in to the peritoneum. These findings were discussed with the referring surgeon Dr. [name] at the time of exam interpretation. Stable nonobstructing renal calculi Stable nodule in the left lower lobe Electronically Signed   By: Linus Mako.D.  On: 08/27/2016 15:17   Dg Chest 2 View  Result Date: 08/27/2016 CLINICAL DATA:  Cough, congestion, recent colon section 08/14/2016 EXAM: CHEST  2 VIEW COMPARISON:  05/30/2016 FINDINGS: Large amount of pneumoperitoneum beneath the  hemidiaphragms. Mild cardiomegaly with basilar atelectasis. No current edema, effusion, or pneumothorax. Trachea is midline. Atherosclerosis noted of the aorta and degenerative changes of the spine. Previous left shoulder hemiarthroplasty partially imaged. IMPRESSION: Large amount of pneumoperitoneum, more than would be expected at 13 days postop. Cardiomegaly and basilar atelectasis Thoracic aortic atherosclerosis These results were called by telephone at the time of interpretation on 08/27/2016 at 8:56 am to Dr. Sharilyn Sites , who verbally acknowledged these results. Electronically Signed   By: Jerilynn Mages.  Shick M.D.   On: 08/27/2016 08:57    Procedures Procedures (including critical care time)  Medications Ordered in ED Medications  sodium chloride 0.9 % bolus 500 mL (0 mLs Intravenous Stopped 08/27/16 1448)  fentaNYL (SUBLIMAZE) injection 50 mcg (50 mcg Intravenous Given 08/27/16 1238)  ondansetron (ZOFRAN) injection 4 mg (4 mg Intravenous Given 08/27/16 1238)  iopamidol (ISOVUE-300) 61 % injection 30 mL (30 mLs Oral Contrast Given 08/27/16 1134)     Initial Impression / Assessment and Plan / ED Course  I have reviewed the triage vital signs and the nursing notes.  Pertinent labs & imaging results that were available during my care of the patient were reviewed by me and considered in my medical decision making (see chart for details).     Patient here for evaluation of shortness of breath, epigastric pain. He was referred by his PCP after chest x-ray demonstrated free air. He had a colectomy performed on January 12. He is not peritoneal on examination but does have upper abdominal tenderness. Discussed the case with surgery, recommend CT abdomen with oral contrast. He was provided pain medications and antiemetics in the emergency department. Plan to admit to surgery service for further treatment.    Final Clinical Impressions(s) / ED Diagnoses   Final diagnoses:  Abdominal pain    New  Prescriptions New Prescriptions   No medications on file     Quintella Reichert, MD 08/27/16 [phone]
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
## 2016-08-27 NOTE — Consult Note (Signed)
PULMONARY / CRITICAL CARE MEDICINE   Name: Derek [name] MRN: [id number] DOB: 1940/09/02    ADMISSION DATE:  08/27/2016 CONSULTATION DATE:  1/25  REFERRING MD:  Dr. [name]  CHIEF COMPLAINT:  Pneumoperitoneum   HISTORY OF PRESENT ILLNESS:   76 year old male with past medical history as below, which is significant for atrial fibrillation on Eliquis, Chronic kidney disease stage III, Crohn's disease, hepatitis C, and small bowel obstruction. He was recently admitted 08/14/2016 through 08/21/2016 For resection of the transverse colon after he was found to have a lesion of the transverse colon identified on recent colonoscopy. This has since been proven to be adenocarcinoma. Since discharge she developed a cough which was evaluated by chest x-ray and demonstrated a significant amount of free air in the peritoneal cavity. He is referred to the emergency department as a result. CT scan of the abdomen in the emergency department was consistent with breakdown of recent surgical anastomosis with the spillage of both air and apparent fecal matter as well as contrast of the peritoneum. He was taken urgently to the operating room for repair. Postoperatively he remained on the ventilator and PCCM has subsequently been consulted.   PAST MEDICAL HISTORY :  He  has a past medical history of Anemia; Anxiety; Aortic insufficiency; Arthritis; Ascending aortic aneurysm ([place]); Atrial fibrillation ([place]); B12 deficiency; Cataract; CKD (chronic kidney disease) stage 3, GFR 30-59 ml/min (08/22/2011); Clotting disorder ([place]); Crohn's disease ([place]); Depression; Enlarged prostate; Enteric hyperoxaluria ([place]) (02/21/2016); GERD (gastroesophageal reflux disease); Gout; Heart murmur; Hepatitis C (1978); Hiatal hernia; History of blood transfusion (1978; 1990's; ?); History of colon polyps; History of kidney stones; History of MRSA infection (2010); History of pulmonary embolism (2006); History of small bowel obstruction; History  of staph infection (1978); Hyperoxaluria ([place]); Hypertension; Insomnia; Internal hemorrhoids; LV dysfunction; Nephrolithiasis; Pancreatitis (2010); Pancytopenia; Peripheral neuropathy ([place]); Pneumonia; Post-traumatic stress syndrome; PTSD (post-traumatic stress disorder); Pulmonary nodule; RLS (restless legs syndrome); Rosacea conjunctivitis(372.31); Secondary hyperparathyroidism ([place]) (02/21/2016); Sinus bradycardia; Skin cancer; Small bowel obstruction; and Thrombocytopenia ([place]).  PAST SURGICAL HISTORY: He  has a past surgical history that includes Cholecystectomy; Hemicolectomy (Right); Bowel resection (1978 X 2); Knee arthroscopy (Left); Ligament repair (Left); Ankle surgery (Right); Foot surgery (Right); Ileocecetomy (1978); Colonoscopy; Esophagogastroduodenoscopy; Total shoulder arthroplasty (Left, 09/21/2013); Appendectomy (1978); Inguinal hernia repair (Right); Colon surgery; Total shoulder arthroplasty (Left, 09/21/2013); Eye surgery; Cardioversion (N/A, 05/29/2016); TEE without cardioversion (N/A, 05/29/2016); and Colon resection (N/A, 08/14/2016).  Allergies  Allergen Reactions  . Lorazepam Other (See Comments)    Reaction:  Hallucinations   . Humira [Adalimumab] Other (See Comments)    Pt states that he got pancreatitis.      No current facility-administered medications on file prior to encounter.    Current Outpatient Prescriptions on File Prior to Encounter  Medication Sig  . Acidophilus Lactobacillus CAPS Take 1 capsule by mouth daily. (Patient taking differently: Take 1 capsule by mouth at bedtime. )  . amiodarone (PACERONE) 200 MG tablet Take 1 tablet (200 mg total) by mouth 2 (two) times daily.  . calcium carbonate (TUMS - DOSED IN MG ELEMENTAL CALCIUM) 500 MG chewable tablet Chew 2 tablets by mouth 2 (two) times daily as needed.   . Cholecalciferol (VITAMIN D3) 1000 units CAPS Take 1,000 Units by mouth daily.  . cyanocobalamin (,VITAMIN B-12,) 1000 MCG/ML injection Inject 1,000  mcg into the muscle every 14 (fourteen) days.  . cycloSPORINE (RESTASIS) 0.05 % ophthalmic emulsion Place 1 drop into both eyes 4 (four) times daily.  Marland Kitchen  febuxostat (ULORIC) 40 MG tablet Take 80 mg by mouth daily.  Marland Kitchen gabapentin (NEURONTIN) 100 MG capsule Take 100-200 mg by mouth 2 (two) times daily. Pt takes one tablet in the morning and two at night.  Marland Kitchen HYDROcodone-acetaminophen (NORCO/VICODIN) 5-325 MG tablet Take 1-2 tablets by mouth every 6 (six) hours as needed for moderate pain.  . Magnesium Oxide 420 MG TABS Take 420 mg by mouth 2 (two) times daily.  . minocycline (MINOCIN,DYNACIN) 100 MG capsule Take 100 mg by mouth daily as needed (when breakout occurs on face).   . Multiple Vitamin (MULTIVITAMIN WITH MINERALS) TABS tablet Take 1 tablet by mouth daily.  Marland Kitchen omeprazole (PRILOSEC) 20 MG capsule Take 20 mg by mouth daily.    Marland Kitchen PARoxetine (PAXIL) 20 MG tablet Take 20 mg by mouth at bedtime.  . Polyvinyl Alcohol-Povidone (REFRESH OP) Apply 1 drop to eye 2 (two) times daily.  . promethazine (PHENERGAN) 25 MG tablet Take 25 mg by mouth every 8 (eight) hours as needed for nausea or vomiting.   . terazosin (HYTRIN) 2 MG capsule Take 2 mg by mouth at bedtime.   . traZODone (DESYREL) 150 MG tablet Take 150 mg by mouth at bedtime.    . saccharomyces boulardii (FLORASTOR) 250 MG capsule Take 1 capsule (250 mg total) by mouth 2 (two) times daily. (Patient not taking: Reported on 07/31/2016)    FAMILY HISTORY:  His indicated that his mother is deceased. He indicated that his father is deceased. He indicated that the status of his sister is unknown. He indicated that his maternal grandmother is deceased. He indicated that his maternal grandfather is deceased. He indicated that his paternal grandmother is deceased. He indicated that his paternal grandfather is deceased. He indicated that the status of his neg hx is unknown.    SOCIAL HISTORY: He  reports that he quit smoking about 41 years ago. His  smoking use included Cigarettes. He has a 40.00 pack-year smoking history. He quit smokeless tobacco use about 38 years ago. His smokeless tobacco use included Chew. He reports that he does not drink alcohol or use drugs.  REVIEW OF SYSTEMS:   unabdle   SUBJECTIVE:    VITAL SIGNS: BP 105/66   Pulse 71   Temp 98.6 F (37 C)   Resp (!) 31   SpO2 96%   HEMODYNAMICS:    VENTILATOR SETTINGS:    INTAKE / OUTPUT: I/O last 3 completed shifts: In: 3500 [I.V.:2500; IV Piggyback:1000] Out: 540 [Urine:140; Blood:400]  PHYSICAL EXAMINATION: General:  Awake, in pain Neuro:  Moves all ext equally, nonfocal, alert, int drowsy HEENT:  jvd down, line wnl Cardiovascular:  s1 s2 RRR no r Lungs:  CTA Abdomen:  Soft bs none, no r/g, ostomy Musculoskeletal:  No edema Skin:  No rash  LABS:  BMET  Recent Labs Lab 08/27/16 1248  NA 134*  K 4.5  CL 100*  CO2 25  BUN 34*  CREATININE 1.73*  GLUCOSE 131*    Electrolytes  Recent Labs Lab 08/27/16 1248  CALCIUM 8.9    CBC  Recent Labs Lab 08/27/16 1248  WBC 15.3*  HGB 12.0*  HCT 35.3*  PLT 263    Coag's No results for input(s): APTT, INR in the last 168 hours.  Sepsis Markers No results for input(s): LATICACIDVEN, PROCALCITON, O2SATVEN in the last 168 hours.  ABG No results for input(s): PHART, PCO2ART, PO2ART in the last 168 hours.  Liver Enzymes  Recent Labs Lab 08/27/16 1248  AST 40  ALT 60  ALKPHOS 253*  BILITOT 0.5  ALBUMIN 2.9*    Cardiac Enzymes No results for input(s): TROPONINI, PROBNP in the last 168 hours.  Glucose No results for input(s): GLUCAP in the last 168 hours.  Imaging Ct Abdomen Pelvis Wo Contrast  Result Date: 08/27/2016 CLINICAL DATA:  History of recent colon surgery 13 days ago with abdominal pain EXAM: CT ABDOMEN AND PELVIS WITHOUT CONTRAST TECHNIQUE: Multidetector CT imaging of the abdomen and pelvis was performed following the standard protocol without IV contrast.  COMPARISON:  05/11/2016 FINDINGS: Lower chest: Lung bases are well aerated. Stable nodule is noted along the major fissure on the left unchanged from the prior exam. Mild scarring is noted in the bases bilaterally. Coronary calcifications are seen. Hepatobiliary: The liver is within normal limits. The gallbladder has been surgically removed. Pancreas: Unremarkable. No pancreatic ductal dilatation or surrounding inflammatory changes. Spleen: Normal in size without focal abnormality. Adrenals/Urinary Tract: Adrenals are within normal limits bilaterally. Bilateral nonobstructing renal calculi are seen. Small renal cysts are noted bilaterally stable from the prior exam. Stomach/Bowel: Postsurgical changes are noted in the midportion of the transverse colon consistent with the given clinical history. There is a large amount of free air and apparent fecal material extrinsic to the bowel in the right mid abdomen. Considerable free air is noted as well. Changes are consistent with anastomotic breakdown. Some mild extravasated contrast is noted in the right lateral abdomen best seen on image number [id number]. Vascular/Lymphatic: Aortic atherosclerosis. No enlarged abdominal or pelvic lymph nodes. Reproductive: Prostate is unremarkable. Other: No abdominal wall hernia or abnormality. No abdominopelvic ascites. Musculoskeletal: Degenerative changes of lumbar spine are noted. IMPRESSION: Changes consistent with breakdown of recent surgical anastomosis in the right upper abdomen with spillage of both air and apparent fecal material as well as contrast in to the peritoneum. These findings were discussed with the referring surgeon Dr. [name] at the time of exam interpretation. Stable nonobstructing renal calculi Stable nodule in the left lower lobe Electronically Signed   By: Inez Catalina M.D.   On: 08/27/2016 15:17   Dg Chest 2 View  Result Date: 08/27/2016 CLINICAL DATA:  Cough, congestion, recent colon section 08/14/2016 EXAM:  CHEST  2 VIEW COMPARISON:  05/30/2016 FINDINGS: Large amount of pneumoperitoneum beneath the hemidiaphragms. Mild cardiomegaly with basilar atelectasis. No current edema, effusion, or pneumothorax. Trachea is midline. Atherosclerosis noted of the aorta and degenerative changes of the spine. Previous left shoulder hemiarthroplasty partially imaged. IMPRESSION: Large amount of pneumoperitoneum, more than would be expected at 13 days postop. Cardiomegaly and basilar atelectasis Thoracic aortic atherosclerosis These results were called by telephone at the time of interpretation on 08/27/2016 at 8:56 am to Dr. Sharilyn Sites , who verbally acknowledged these results. Electronically Signed   By: Jerilynn Mages.  Shick M.D.   On: 08/27/2016 08:57     STUDIES:  CT abdomen 1/25 > Changes consistent with breakdown of recent surgical anastomosis in the right upper abdomen with spillage of both air and apparent fecal material as well as contrast in to the peritoneum. Stable nonobstructing renal calculi. Stable nodule in the left lower lobe  CULTURES:   ANTIBIOTICS: Zosyn 1/25 >  SIGNIFICANT EVENTS: 1/12-1/19 admit for transverse colon resection 1/25 admit for anastomotic leak.  LINES/TUBES: 1/25 ETT >>>  DISCUSSION:   ASSESSMENT / PLAN:  PULMONARY A: No acute issues  P:   Supplemental O2 to keep SpO2 > 92%  CARDIOVASCULAR A:  Atrial fibrillation Ascending aortic aneurysm 5.1cm followed by CVTS  Hypotension in post-operative setting  P:  Telemetry monitoring IVF hydration Holding home eliquis MAP goal > 66mHg May need pressors if unable to volume resuscitate  RENAL A:   CKD III  P:   Follow BMP and UOP  GASTROINTESTINAL A:   Anastomotic leak from 1/12 transverse colon resection Crohn's disease [place] CA of colon (lesion resected 1/12)  P:   NPO Management per surgery PPI for SUP  HEMATOLOGIC A:   Mild anemia  P:  Follow CBC Holding eliquis  INFECTIOUS A:   Peritonitis  P:    ABX as above  ENDOCRINE A:   Hyperglycemia with no history DM  P:   Monitor  NEUROLOGIC A:   NO acute issues  P:   Monitor  FAMILY  - Updates:   - Inter-disciplinary family meet or Palliative Care meeting due by:  2/1   PGeorgann Housekeeper AGACNP-BC LValenciaPulmonology/Critical Care Pager [phone] 08/27/2016 11:05 PM   STAFF NOTE: ILinwood Dibbles MD FACP have personally reviewed patient's available data, including medical history, events of note, physical examination and test results as part of my evaluation. I have discussed with resident/NP and other care providers such as pharmacist, RN and RRT. In addition, I personally evaluated patient and elicited key findings of: awakens, follows commands, lungs clear to bases, abdo soft, bu tender, no r, ostomy, no edema, colon perf, septic shock, and requires neo now in setting CRI and borderline BP, high risk atn, add neo to map goal, assessing cvp now, add diflucan and vanc to current abx, bolus further, assess cortisol level and then empiric stress roids ( chrons, although no home steroids noted), concerned about dilaudid clearance - dc, use fentanyl, I have fully updated pt and wife in room, bmet in am, assess glu levels with steroid add The patient is critically ill with multiple organ systems failure and requires high complexity decision making for assessment and support, frequent evaluation and titration of therapies, application of advanced monitoring technologies and extensive interpretation of multiple databases.   Critical Care Time devoted to patient care services described in this note is 30  Minutes. This time reflects time of care of this signee: DMerrie Roof MD FACP. This critical care time does not reflect procedure time, or teaching time or supervisory time of PA/NP/Med student/Med Resident etc but could involve care discussion time. Rest per NP/medical resident whose note is outlined above  and that I agree with   DLavon Paganini FTitus Mould MD, FTimePgr: 3[facility]acliffPulmonary & Critical Care 08/27/2016 11:54 PM

## 2016-08-27 NOTE — ED Notes (Signed)
Sputum sample was not enough for a culture.  Will ask pt to try again.

## 2016-08-27 NOTE — ED Notes (Signed)
Nurse is in the room collect labs

## 2016-08-27 NOTE — ED Notes (Signed)
Bed: WA24 Expected date:  Expected time:  Means of arrival:  Comments: Triage 2 

## 2016-08-27 NOTE — Transfer of Care (Signed)
Immediate Anesthesia Transfer of Care Note  Patient: Derek [name]  Procedure(s) Performed: Procedure(s): EXPLORATORYLAPAROTOMY, LYSIS OF ADHESIONS, ILEOSTOMY, RIGHT COLECTOMY (N/A)  Patient Location: PACU  Anesthesia Type:General  Level of Consciousness: awake, alert  and oriented  Airway & Oxygen Therapy: Patient Spontanous Breathing and Patient connected to face mask oxygen  Post-op Assessment: Report given to RN and Post -op Vital signs reviewed and stable  Post vital signs: Reviewed and stable  Last Vitals:  Vitals:   08/27/16 1500 08/27/16 2038  BP: 105/66   Pulse: 71   Resp: (!) 31   Temp:  (P) 37 C    Last Pain:  Vitals:   08/27/16 1453  TempSrc:   PainSc: 4          Complications: No apparent anesthesia complications

## 2016-08-27 NOTE — Progress Notes (Signed)
eLink Physician-Brief Progress Note Patient Name: Derek [name] DOB: 1940/11/07 MRN: [id number]   Date of Service  08/27/2016  HPI/Events of Note  Postoperative pain Nausea hypotension  eICU Interventions  1. Fentanyl PRN 2. Zofran ordered 3. Fluid bolus - check CVP     Intervention Category Major Interventions: Hypotension - evaluation and management Intermediate Interventions: Pain - evaluation and management  [name] 08/27/2016, 11:45 PM

## 2016-08-27 NOTE — Anesthesia Preprocedure Evaluation (Addendum)
Anesthesia Evaluation  Patient identified by MRN, date of birth, ID band Patient awake    Reviewed: Allergy & Precautions, NPO status , Patient's Chart, lab work & pertinent test results  Airway Mallampati: II  TM Distance: >3 FB     Dental   Pulmonary pneumonia, former smoker,    breath sounds clear to auscultation       Cardiovascular hypertension, + CAD and + Peripheral Vascular Disease  + Valvular Problems/Murmurs  Rhythm:Regular Rate:Normal  Echo 10.2017 - Left ventricle: The cavity size was normal. There was moderate concentric hypertrophy. Systolic function was mildly reduced. The estimated ejection fraction was in the range of 45% to 50%. Wall motion was normal; there were no regional wall motion abnormalities. - Aortic valve: There was mild regurgitation. - Aorta: Aortic root dimension: 51 mm (ED). Ascending aorta diameter: 47 mm (ED). - Aortic root: The aortic root was moderately dilated. - Ascending aorta: The ascending aorta was moderately dilated. - Mitral valve: Calcified annulus.   Neuro/Psych PSYCHIATRIC DISORDERS Anxiety Depression  Neuromuscular disease    GI/Hepatic hiatal hernia, GERD  ,(+) Hepatitis -  Endo/Other    Renal/GU Renal disease     Musculoskeletal  (+) Arthritis ,   Abdominal   Peds  Hematology  (+) anemia ,   Anesthesia Other Findings   Reproductive/Obstetrics                            Anesthesia Physical  Anesthesia Plan  ASA: III and emergent  Anesthesia Plan: General   Post-op Pain Management:    Induction: Intravenous  Airway Management Planned: Oral ETT  Additional Equipment:   Intra-op Plan:   Post-operative Plan: Possible Post-op intubation/ventilation  Informed Consent: I have reviewed the patients History and Physical, chart, labs and discussed the procedure including the risks, benefits and alternatives for the proposed anesthesia with  the patient or authorized representative who has indicated his/her understanding and acceptance.   Dental advisory given  Plan Discussed with: CRNA  Anesthesia Plan Comments:        Anesthesia Quick Evaluation

## 2016-08-28 ENCOUNTER — Encounter (HOSPITAL_COMMUNITY): Payer: Self-pay | Admitting: Surgery

## 2016-08-28 DIAGNOSIS — K559 Vascular disorder of intestine, unspecified: Secondary | ICD-10-CM

## 2016-08-28 DIAGNOSIS — R579 Shock, unspecified: Secondary | ICD-10-CM

## 2016-08-28 DIAGNOSIS — K352 Acute appendicitis with generalized peritonitis: Secondary | ICD-10-CM

## 2016-08-28 LAB — CBC
HEMATOCRIT: 25.8 % — AB (ref 39.0–52.0)
HEMOGLOBIN: 8.6 g/dL — AB (ref 13.0–17.0)
MCH: 28.4 pg (ref 26.0–34.0)
MCHC: 33.3 g/dL (ref 30.0–36.0)
MCV: 85.1 fL (ref 78.0–100.0)
Platelets: 239 10*3/uL (ref 150–400)
RBC: 3.03 MIL/uL — ABNORMAL LOW (ref 4.22–5.81)
RDW: 13.8 % (ref 11.5–15.5)
WBC: 21.3 10*3/uL — ABNORMAL HIGH (ref 4.0–10.5)

## 2016-08-28 LAB — COMPREHENSIVE METABOLIC PANEL
ALBUMIN: 2.2 g/dL — AB (ref 3.5–5.0)
ALK PHOS: 130 U/L — AB (ref 38–126)
ALT: 36 U/L (ref 17–63)
ANION GAP: 6 (ref 5–15)
AST: 29 U/L (ref 15–41)
BILIRUBIN TOTAL: 1.5 mg/dL — AB (ref 0.3–1.2)
BUN: 25 mg/dL — ABNORMAL HIGH (ref 6–20)
CALCIUM: 7.5 mg/dL — AB (ref 8.9–10.3)
CO2: 23 mmol/L (ref 22–32)
Chloride: 105 mmol/L (ref 101–111)
Creatinine, Ser: 1.41 mg/dL — ABNORMAL HIGH (ref 0.61–1.24)
GFR calc non Af Amer: 48 mL/min — ABNORMAL LOW (ref >60)
GFR, EST AFRICAN AMERICAN: 56 mL/min — AB (ref >60)
GLUCOSE: 136 mg/dL — AB (ref 65–99)
POTASSIUM: 5 mmol/L (ref 3.5–5.1)
Sodium: 134 mmol/L — ABNORMAL LOW (ref 135–145)
TOTAL PROTEIN: 4.7 g/dL — AB (ref 6.5–8.1)

## 2016-08-28 LAB — TROPONIN I: Troponin I: <0.03 ng/mL (ref <0.03)

## 2016-08-28 LAB — GLUCOSE, CAPILLARY
GLUCOSE-CAPILLARY: 115 mg/dL — AB (ref 65–99)
Glucose-Capillary: 141 mg/dL — ABNORMAL HIGH (ref 65–99)

## 2016-08-28 LAB — LACTIC ACID, PLASMA: Lactic Acid, Venous: 2.7 mmol/L (ref 0.5–1.9)

## 2016-08-28 LAB — CORTISOL: Cortisol, Plasma: 92.2 ug/dL

## 2016-08-28 MED ORDER — TRACE MINERALS CR-CU-MN-SE-ZN 10-1000-500-60 MCG/ML IV SOLN
INTRAVENOUS | Status: AC
  Administered 2016-08-28: 17:00:00 via INTRAVENOUS
  Filled 2016-08-28 (×2): qty 720

## 2016-08-28 MED ORDER — INSULIN ASPART 100 UNIT/ML ~~LOC~~ SOLN
0.0000 [IU] | SUBCUTANEOUS | Status: DC
  Administered 2016-08-29: 2 [IU] via SUBCUTANEOUS
  Administered 2016-08-29 (×4): 1 [IU] via SUBCUTANEOUS
  Administered 2016-08-30 (×2): 2 [IU] via SUBCUTANEOUS
  Administered 2016-08-30 (×3): 1 [IU] via SUBCUTANEOUS
  Administered 2016-08-31: 2 [IU] via SUBCUTANEOUS
  Administered 2016-08-31 (×2): 1 [IU] via SUBCUTANEOUS

## 2016-08-28 MED ORDER — CYANOCOBALAMIN 1000 MCG/ML IJ SOLN
1000.0000 ug | Freq: Once | INTRAMUSCULAR | Status: AC
  Administered 2016-08-29: 1000 ug via INTRAMUSCULAR
  Filled 2016-08-28: qty 1

## 2016-08-28 MED ORDER — MORPHINE SULFATE (PF) 2 MG/ML IV SOLN
1.0000 mg | INTRAVENOUS | Status: DC | PRN
  Administered 2016-08-28: 4 mg via INTRAVENOUS
  Administered 2016-08-28: 1 mg via INTRAVENOUS
  Administered 2016-08-29 (×3): 2 mg via INTRAVENOUS
  Administered 2016-08-29: 3 mg via INTRAVENOUS
  Administered 2016-08-29: 2 mg via INTRAVENOUS
  Administered 2016-08-29: 4 mg via INTRAVENOUS
  Administered 2016-08-30: 2 mg via INTRAVENOUS
  Administered 2016-08-30: 4 mg via INTRAVENOUS
  Administered 2016-08-30 – 2016-09-01 (×10): 2 mg via INTRAVENOUS
  Filled 2016-08-28: qty 2
  Filled 2016-08-28 (×2): qty 1
  Filled 2016-08-28: qty 2
  Filled 2016-08-28 (×4): qty 1
  Filled 2016-08-28: qty 2
  Filled 2016-08-28 (×2): qty 1
  Filled 2016-08-28: qty 2
  Filled 2016-08-28 (×6): qty 1
  Filled 2016-08-28: qty 2

## 2016-08-28 MED ORDER — HYDROCODONE-ACETAMINOPHEN 5-325 MG PO TABS
1.0000 | ORAL_TABLET | ORAL | Status: DC | PRN
  Filled 2016-08-28: qty 1

## 2016-08-28 MED ORDER — ONDANSETRON 4 MG PO TBDP: 4.0000 mg | ORAL_TABLET | Freq: Four times a day (QID) | ORAL | Status: DC | PRN

## 2016-08-28 MED ORDER — PIPERACILLIN-TAZOBACTAM 3.375 G IVPB
3.3750 g | Freq: Three times a day (TID) | INTRAVENOUS | Status: DC
  Administered 2016-08-28 – 2016-09-06 (×29): 3.375 g via INTRAVENOUS
  Filled 2016-08-28 (×27): qty 50

## 2016-08-28 MED ORDER — PIPERACILLIN-TAZOBACTAM 3.375 G IVPB: 3.3750 g | Freq: Three times a day (TID) | INTRAVENOUS | Status: DC

## 2016-08-28 MED ORDER — VANCOMYCIN HCL IN DEXTROSE 750-5 MG/150ML-% IV SOLN
750.0000 mg | Freq: Two times a day (BID) | INTRAVENOUS | Status: DC
  Administered 2016-08-28 – 2016-08-29 (×4): 750 mg via INTRAVENOUS
  Filled 2016-08-28 (×5): qty 150

## 2016-08-28 MED ORDER — SODIUM CHLORIDE 0.9 % IV BOLUS (SEPSIS)
500.0000 mL | Freq: Once | INTRAVENOUS | Status: AC
  Administered 2016-08-28: 500 mL via INTRAVENOUS

## 2016-08-28 MED ORDER — DEXTROSE-NACL 5-0.9 % IV SOLN
INTRAVENOUS | Status: AC
  Administered 2016-08-28: 22:00:00 via INTRAVENOUS

## 2016-08-28 MED ORDER — FLUCONAZOLE IN SODIUM CHLORIDE 400-0.9 MG/200ML-% IV SOLN
400.0000 mg | INTRAVENOUS | Status: DC
  Administered 2016-08-28 – 2016-08-30 (×3): 400 mg via INTRAVENOUS
  Filled 2016-08-28 (×3): qty 200

## 2016-08-28 MED ORDER — ONDANSETRON HCL 4 MG/2ML IJ SOLN
4.0000 mg | Freq: Four times a day (QID) | INTRAMUSCULAR | Status: DC | PRN
  Administered 2016-09-08 (×2): 4 mg via INTRAVENOUS
  Filled 2016-08-28 (×3): qty 2

## 2016-08-28 MED ORDER — DIAZEPAM 5 MG/ML IJ SOLN
5.0000 mg | Freq: Three times a day (TID) | INTRAMUSCULAR | Status: DC | PRN
  Administered 2016-08-28 – 2016-09-01 (×6): 5 mg via INTRAVENOUS
  Filled 2016-08-28 (×6): qty 2

## 2016-08-28 NOTE — Progress Notes (Signed)
PULMONARY / CRITICAL CARE MEDICINE   Name: Derek [name] MRN: [id number] DOB: 1941/06/05    ADMISSION DATE:  08/27/2016 CONSULTATION DATE:  1/25  REFERRING MD:  Dr. [name]  CHIEF COMPLAINT:  Pneumoperitoneum   HISTORY OF PRESENT ILLNESS:   76 year old male with past medical history as below, which is significant for atrial fibrillation on Eliquis, Chronic kidney disease stage III, Crohn's disease, hepatitis C, and small bowel obstruction. He was recently admitted 08/14/2016 through 08/21/2016 For resection of the transverse colon after he was found to have a lesion of the transverse colon identified on recent colonoscopy. This has since been proven to be adenocarcinoma. Since discharge developed a cough which was evaluated by chest x-ray and demonstrated a significant amount of free air in the peritoneal cavity. He is referred to the emergency department as a result. CT scan of the abdomen in the emergency department was consistent with breakdown of recent surgical anastomosis with the spillage of both air and apparent fecal matter as well as contrast of the peritoneum. He was taken urgently to the operating room for repair. PCCM was asked to see post-op    SUBJECTIVE:   c/o pain after dressing change/packing  VITAL SIGNS: BP (!) 96/57   Pulse 84   Temp 97.6 F (36.4 C) (Oral)   Resp (!) 28   Ht 6' (1.829 m)   Wt 189 lb 13.1 oz (86.1 kg)   SpO2 100%   BMI 25.74 kg/m  2 ltiers  HEMODYNAMICS: CVP:  [8 mmHg-21 mmHg] 8 mmHg  VENTILATOR SETTINGS:    INTAKE / OUTPUT: I/O last 3 completed shifts: In: 6050 [I.V.:4600; IV Piggyback:1450] Out: 1310 [Urine:590; Drains:220; Blood:500]  PHYSICAL EXAMINATION: General appearance:  76 year old  Male well nourished  NAD,   conversant  Eyes: anicteric sclerae icteric , moist conjunctivae; PERRL, EOMI bilaterally. Mouth:  membranes and no mucosal ulcerations; normal hard and soft palate Neck: Trachea midline; neck supple, no  JVD Lungs/chest: fine crackles posterior bases, with normal respiratory effort and no intercostal retractions CV: RRR, no MRGs  Abdomen: Soft, mid abd incision CD&I, stoma is pink and ostomy bag in place  Extremities: No peripheral edema or extremity lymphadenopathy Skin: Normal temperature, turgor and texture; no rash, ulcers or subcutaneous nodules Psych: Appropriate affect, alert and oriented to person, place and time  LABS:  BMET  Recent Labs Lab 08/27/16 1248 08/27/16 2010 08/28/16 0500  NA 134* 133* 134*  K 4.5 4.1 5.0  CL 100* 102 105  CO2 25 21* 23  BUN 34* 28* 25*  CREATININE 1.73* 1.70* 1.41*  GLUCOSE 131* 137* 136*    Electrolytes  Recent Labs Lab 08/27/16 1248 08/27/16 2010 08/28/16 0500  CALCIUM 8.9 8.0* 7.5*    CBC  Recent Labs Lab 08/27/16 1248 08/27/16 2010 08/28/16 0500  WBC 15.3* 18.7* 21.3*  HGB 12.0* 9.7* 8.6*  HCT 35.3* 30.1* 25.8*  PLT 263 302 239    Coag's No results for input(s): APTT, INR in the last 168 hours.  Sepsis Markers  Recent Labs Lab 08/28/16 0106  LATICACIDVEN 2.7*    ABG No results for input(s): PHART, PCO2ART, PO2ART in the last 168 hours.  Liver Enzymes  Recent Labs Lab 08/27/16 1248 08/27/16 2010 08/28/16 0500  AST 40 35 29  ALT 60 39 36  ALKPHOS 253* 166* 130*  BILITOT 0.5 1.6* 1.5*  ALBUMIN 2.9* 2.6* 2.2*    Cardiac Enzymes  Recent Labs Lab 08/28/16 0000  TROPONINI <0.03  Glucose  Recent Labs Lab 08/28/16 0046  GLUCAP 115*    Imaging Ct Abdomen Pelvis Wo Contrast  Result Date: 08/27/2016 CLINICAL DATA:  History of recent colon surgery 13 days ago with abdominal pain EXAM: CT ABDOMEN AND PELVIS WITHOUT CONTRAST TECHNIQUE: Multidetector CT imaging of the abdomen and pelvis was performed following the standard protocol without IV contrast. COMPARISON:  05/11/2016 FINDINGS: Lower chest: Lung bases are well aerated. Stable nodule is noted along the major fissure on the left unchanged  from the prior exam. Mild scarring is noted in the bases bilaterally. Coronary calcifications are seen. Hepatobiliary: The liver is within normal limits. The gallbladder has been surgically removed. Pancreas: Unremarkable. No pancreatic ductal dilatation or surrounding inflammatory changes. Spleen: Normal in size without focal abnormality. Adrenals/Urinary Tract: Adrenals are within normal limits bilaterally. Bilateral nonobstructing renal calculi are seen. Small renal cysts are noted bilaterally stable from the prior exam. Stomach/Bowel: Postsurgical changes are noted in the midportion of the transverse colon consistent with the given clinical history. There is a large amount of free air and apparent fecal material extrinsic to the bowel in the right mid abdomen. Considerable free air is noted as well. Changes are consistent with anastomotic breakdown. Some mild extravasated contrast is noted in the right lateral abdomen best seen on image number [id number]. Vascular/Lymphatic: Aortic atherosclerosis. No enlarged abdominal or pelvic lymph nodes. Reproductive: Prostate is unremarkable. Other: No abdominal wall hernia or abnormality. No abdominopelvic ascites. Musculoskeletal: Degenerative changes of lumbar spine are noted. IMPRESSION: Changes consistent with breakdown of recent surgical anastomosis in the right upper abdomen with spillage of both air and apparent fecal material as well as contrast in to the peritoneum. These findings were discussed with the referring surgeon Dr. [name] at the time of exam interpretation. Stable nonobstructing renal calculi Stable nodule in the left lower lobe Electronically Signed   By: Inez Catalina M.D.   On: 08/27/2016 15:17   X-ray Chest Pa Or Ap  Result Date: 08/27/2016 CLINICAL DATA:  Central line placement. EXAM: CHEST 1 VIEW COMPARISON:  Earlier today. FINDINGS: Interval enlargement of the cardiac silhouette. Resolved free peritoneal air. Mild bibasilar atelectasis. Interval  right jugular catheter with its tip at the superior cavoatrial junction. No pneumothorax. Aortic arch calcification. Diffuse osteopenia, thoracic spine degenerative changes and left humeral head prosthesis. IMPRESSION: 1. Right jugular catheter tip at the superior cavoatrial junction without pneumothorax. 2. Interval cardiomegaly and mild bibasilar atelectasis. 3. Aortic atherosclerosis. Electronically Signed   By: Claudie Revering M.D.   On: 08/27/2016 21:03     STUDIES:  CT abdomen 1/25 > Changes consistent with breakdown of recent surgical anastomosis in the right upper abdomen with spillage of both air and apparent fecal material as well as contrast in to the peritoneum. Stable nonobstructing renal calculi. Stable nodule in the left lower lobe  CULTURES: Wound culture 1/25: few gpc and GNR>>>   ANTIBIOTICS: Zosyn 1/25 > Diflucan 1/25>>>  SIGNIFICANT EVENTS: 1/12-1/19 admit for transverse colon resection 1/25 admit for anastomotic leak. Underwent exploration, lysis of adhesion, ileostomy and right colectomy for ischemic infarct right transverse colon. Got fluid challenges for hypotension.   LINES/TUBES: 1/25 ETT >>>  DISCUSSION:   ASSESSMENT / PLAN:  Septic and hypovolemic shock in setting of ischemic bowel & peritonitis.  -> WBCs climbed, ? R/t additional stress response from surg vs steroids?  Plan Cont IVFs CVP goal 8-12 MAP goal >65 (neo off currently) Cont zosyn & diflucan (narrow depending C&S) F/u cultures  Dc solucortef (  cort was 92.2) F/u wbcs in am  H/o adenocarcinoma of colon. Initial resection 1/12. Now S/p exploratory laparotomy, resection of colon, lysis of adhesion and ileostomy for ischemic bowel and anastomotic leak on 1/25 Plan Bowel rest Diet per surgical team -->starting TNA Wound care per surgical team   H/o atrial fib Ascending aortic aneurysm f/b CVTS Plan Holding eliquis  Cont tele Holding amio for now   Acute on chronic renal failure  -peak  sCr 1.73, now 1.41 w/ IV hydration Plan Cont MIVFs Renal dose meds Strict I&O Repeat chem in am   Mild hypoxia  ->likely post-op atelectasis Plan Cont pulm hygiene measures Wean O2  Mild anemia. Has chronic anemia c/b post-op anemia  -currently stable Plan Follow cbc PAS Resume anticoagulation when ok w/ surgical team    Mild hyperglycemia  Plan Cont ssi   My critical care time 32 minutes   Erick Colace ACNP-BC [facility] Pager # [phone] OR # [phone] if no answer

## 2016-08-28 NOTE — Progress Notes (Addendum)
PHARMACY - ADULT TOTAL PARENTERAL NUTRITION CONSULT NOTE   Pharmacy Consult for TPN Indication: Post-operative ileus   76 y/o M s/p laparoscopic resection of transverse colon and enterolysis of adhesions on 08/14/16.  Subsequently was found to have free air under diaphragm and was taken to OR on 08/27/16 for exploratory laparotomy, which found ischemic/infarcted R transverse colon, extensive intra-abdominal adhesions.  Patient underwent R colectomy with end ileostomy and Hartmann's pouch at L transverse colon, and lysis of adhesions x 90 minutes.  Orders received POD#1 to begin TPN with pharmacy assistance.    PMH is extensive and is outlined in the physician H&P from 08/27/16.  Problems of note from a nutrition standpoint include anemia, Vit B12 deficiency (takes 1000 mcg IM every 14 days, reportedly last taken 1/11), CKD III (baseline SCr ~ 1.4 to 1.8) with secondary hyperparathyroidism, enteric hyperoxaluria, gout, atrial fibrillation (on Eliquis PTA) hepatitis C (spontaneously cleared), LV dysfunction (EF 45-50% in 2015, normalized on subsequent echoes).   Patient Measurements: Ht 6'0", Wt 89.9 kg   Significant Events: 1/26  POD#1 - in ICU; last night required fluid boluses and phenylephrine drip for pressor support.  Has now weaned off phenylephrine but remains on hydrocortisone.  Spoke with Dr. [name] - OK to reduce mIVF tonight when TPN begins.  TPN Access:     08/27/16: double-lumen CVC TPN start date: 08/28/16  Nutritional Goals: Recommendations from RD are pending.  Current Nutrition:  NPO  Maintenance IVF: D-5-NS at 125 mL/hr  Insulin Requirements: None at present   Recent Labs  08/27/16 2010 08/28/16 0500  NA 133* 134*  K 4.1 5.0  CL 102 105  CO2 21* 23  GLUCOSE 137* 136*  BUN 28* 25*  CREATININE 1.70* 1.41*  CALCIUM 8.0* 7.5*  ALBUMIN 2.6* 2.2*  ALKPHOS 166* 130*  AST 35 29  ALT 39 36  BILITOT 1.6* 1.5*  Corrected Ca 8.9 on 1/26 Mg was 1.9 on  1/16  Assessment:  Glucose - mildly elevated; no h/o DM; concurrent stress-dose hydrocortisone noted.  Goal CBGs 100-150 while on TPN.  Electrolytes - WNL.  No recent phosphorus level available in [facility]; hx of secondary hyperparathyroidism in setting of renal insufficiency is noted. Checking phosphorus with tomorrow's AM labs..  Renal - SCr improved from yesterday; h/o CKD III; baseline SCr ~ 1.4-1.8  Hepatic - Alk phos elevated but <1.5x ULN; bilirubin elevated but <1.5 x ULN.  Transaminases WNL.  Triglycerides - checking tomorrow AM but planning to withhold or limit lipids for first week while ICU status.  Prealbumin - checking tomorrow AM   Plan:  1. At 1800 tonight:  Begin Clinmix-E 5/20 at 30 mL/hr  Include standard multivitamins and trace elements in initial bag  Reduce mIVF to 95 mL/hr (confirmed with Dr. [name])  Begin CBGs q4h and cover with SSI Novolog sensitive-scale 2. Please note that due to national shortage, the Clinimix formulas available to [place] are limited and can change on a daily basis.  3. Trace elements are also on national shortage.  As per current [place] standard, trace elements will be provided every other day,  Multivitamins will be added daily. 4. Withhold lipids or limit to 100 grams/week for first week of TPN therapy as per ASPEN guidelines. 5. Vitamin B-12 1000 mcg IM x 1 tomorrow given h/o deficiency and receiving treatment with dose due this week. 6. F/U on Nutritional Recommendations from RD to establish goal rate for TPN. 7. CMet, Mg, Phos tomorrow AM, then at least twice  a week (on Mondays and Thursdays) 8. Triglycerides tomorrow AM, then at least weekly 9. Prealbumin tomorrow, then weekly. 10. Follow clinical course daily.  Clayburn Pert, PharmD, BCPS Pager: [phone] 08/28/2016  9:46 AM

## 2016-08-28 NOTE — Evaluation (Signed)
Physical Therapy Evaluation Patient Details Name: Derek [name] MRN: [id number] DOB: 02-Nov-1940 Today's Date: 08/28/2016   History of Present Illness  HPI: Derek [name] is a 76 y.o. male with  history colon poylps, AAA,I, Crohn's disease, B12 deficiency, GERD, Gout, Hep C, PE, chronic thrombocytopenia,  PTSD, Lung nodule, HTN, RLS , on 08/14/16 admitted  for exploratory lap with lysis of adhesions, of adhesions, right colectomy. On 08/27/16  CT scan of the abdomen in the emergency department was consistent with breakdown of recent surgical anastomosis with the spillage of both air and apparent fecal matter as well as contrast of the peritoneum. He was taken urgently to the operating room for repair  Clinical Impression  The patient is able to mobilize to recliner with 2 assist hand hold today. BP 89/60 after transfer with legs dependent. Elevated the legs. Compalined of mild dizziness. Pt admitted with above diagnosis. Pt currently with functional limitations due to the deficits listed below (see PT Problem List).  Pt will benefit from skilled PT to increase their independence and safety with mobility to allow discharge to the venue listed below.  The patient progressed well last admission and [place] home. Will follow.     Follow Up Recommendations SNF;Supervision/Assistance - 24 hour;Home health PT (depends on progress)    Equipment Recommendations   (TBD)    Recommendations for Other Services       Precautions / Restrictions Precautions Precautions: Fall Precaution Comments: colostomy and JP drain on right      Mobility  Bed Mobility Overal bed mobility: Needs Assistance Bed Mobility: Supine to Sit     Supine to sit: Mod assist;HOB elevated;+2 for safety/equipment     General bed mobility comments: assist with trunk, cues for abdomen protection  Transfers Overall transfer level: Needs assistance Equipment used: 2 person hand held assist Transfers: Sit to/from  [facility] Sit to Stand: Mod assist;+2 safety/equipment;+2 physical assistance Stand pivot transfers: Mod assist;+2 safety/equipment;+2 physical assistance       General transfer comment: patient able to bear weight on the legs. take small steps to recliner.   Ambulation/Gait                Stairs            Wheelchair Mobility    Modified Rankin (Stroke Patients Only)       Balance                                             Pertinent Vitals/Pain Pain Assessment: 0-10 Pain Score: 5  Pain Location: abdomen when coughs Pain Descriptors / Indicators: Discomfort;Grimacing;Guarding;Sharp Pain Intervention(s): Monitored during session;Premedicated before session    Home Living Family/patient expects to be discharged to:: Private residence Living Arrangements: Spouse/significant other Available Help at Discharge: Family Type of Home: House Home Access: Stairs to enter Entrance Stairs-Rails: Right;Left   Home Layout: One level        Prior Function Level of Independence: Independent         Comments: woodworking (making furniture, tables), fishing ([place]), reading from previous encounter     Hand Dominance        Extremity/Trunk Assessment   Upper Extremity Assessment Upper Extremity Assessment: Generalized weakness    Lower Extremity Assessment Lower Extremity Assessment: Generalized weakness       Communication      Cognition  Arousal/Alertness: Awake/alert Behavior During Therapy: WFL for tasks assessed/performed Overall Cognitive Status: Within Functional Limits for tasks assessed                      General Comments      Exercises     Assessment/Plan    PT Assessment Patient needs continued PT services  PT Problem List Decreased strength;Decreased range of motion;Decreased activity tolerance;Decreased mobility;Decreased knowledge of precautions;Decreased safety  awareness;Decreased knowledge of use of DME;Pain          PT Treatment Interventions DME instruction;Gait training;Functional mobility training;Therapeutic activities;Therapeutic exercise;Patient/family education    PT Goals (Current goals can be found in the Care Plan section)  Acute Rehab PT Goals Patient Stated Goal: get up PT Goal Formulation: With patient Time For Goal Achievement: 09/11/16 Potential to Achieve Goals: Good    Frequency Min 3X/week   Barriers to discharge        Co-evaluation               End of Session   Activity Tolerance: Patient tolerated treatment well Patient left: in chair;with call bell/phone within reach;with chair alarm set;with nursing/sitter in room Nurse Communication: Mobility status         Time: 2341-4436 PT Time Calculation (min) (ACUTE ONLY): 20 min   Charges:   PT Evaluation $PT Eval Moderate Complexity: 1 Procedure     PT G CodesClaretha Cooper 08/28/2016, 4:18 PM Tresa Endo PT [phone]

## 2016-08-28 NOTE — Progress Notes (Signed)
Pharmacy Antibiotic Note  Derek [name] is a 76 y.o. male with extensive hx and s/p laparoscopic resection of transverse colon and enterlysis of adhesion on 1/12 now with abdominal pain admitted on 08/27/2016 with intra-abdominal infection.  Pharmacy has been consulted for vancomycin and fluconazole dosing.  Plan: Vancomycin 750 mg IV q12h  VT=15-20 mg/L Fluconazole 400 mg IV q24h ZEI (MD)   Temp (24hrs), Avg:98.1 F (36.7 C), Min:97.7 F (36.5 C), Max:98.6 F (37 C)   Recent Labs Lab 08/27/16 1248 08/27/16 2010  WBC 15.3* 18.7*  CREATININE 1.73* 1.70*    Estimated Creatinine Clearance: 42.5 mL/min (by C-G formula based on SCr of 1.7 mg/dL (H)).    Allergies  Allergen Reactions  . Lorazepam Other (See Comments)    Reaction:  Hallucinations   . Humira [Adalimumab] Other (See Comments)    Pt states that he got pancreatitis.      Antimicrobials this admission: 1/25 zosyn >>  1/26 vancomycin >>  1/26 fluconazole >>  Dose adjustments this admission:   Microbiology results:  BCx:   UCx:    Sputum:    MRSA PCR:   Thank you for allowing pharmacy to be a part of this patient's care.  Dorrene German 08/28/2016 12:35 AM

## 2016-08-28 NOTE — Progress Notes (Signed)
Patient would not like to turn on sides d/t pain. Has pillow for abdominal splinting. Will continue to monitor patient.

## 2016-08-28 NOTE — Consult Note (Signed)
[facility] Nurse ostomy consult note Stoma type/location: RLQ ileostomy created emergently on 08/27/16 by Dr. [name]. Stomal assessment/size:  1 and 1/2 inches, round, os at center, deep red/dusky Peristomal assessment: intact Treatment options for stomal/peristomal skin: skin barrier ring Output: small amount of serosanguinous drainage in ostomy pouch applied in operative suite  Ostomy pouching: 1pc.convex ostomy pouching system with skin barrier ring Education provided: None today.  Patient understands that an ostomy nurse will be helping him to learn about self care when he recovers and is upstairs on a general surgery floor.  Wife  Arrives at the end of our session and is provided with an ostomy teaching booklet and we discuss performing the next pouch change when she is present next week. A spare pouch, skin barrier ring, pattern and ostomy sizing guide is left at bedside. Enrolled patient in [place] program: No  WOC nursing team will follow, and will remain available to this patient, the nursing, surgical and medical teams.   Thanks, Maudie Flakes, MSN, RN, [place], Arther Abbott  Pager# [phone]

## 2016-08-28 NOTE — Progress Notes (Signed)
[facility]:  [phone] General Surgery Progress Note   LOS: 1 day  POD -  1 Day Post-Op  Assessment/Plan: 1.  EXPLORATORYLAPAROTOMY, LYSIS OF ADHESIONS, ILEOSTOMY, RIGHT COLECTOMY with end ileostomy and Hartmann's pouch (at left transverse colon) - 08/27/2016 - D. Earlie Schank  For ischemic/infarcted right transverse colon  WBC - 21,300 - 08/28/2016  On Zosyn  Wound packed open - to start dressing changes BID today  Doing okay - primary problem is abdominal pain (on fentanyl and morphine)  2.  Open RESECTION TRANSVERSE COLON, Enterolysis of adhesions -  08/14/2016 - D. Loveda Colaizzi  For 1.2 cm adenocarcinoma of the right transverse colon, 0/7 nodes (T2, N0)  3. Gout involving left knee On Uloric - he had trouble with this during his last hospitalization while NPO  4. ATRIAL FIBRILLATION, CONTROLLED (I48.91) Followed by Dr. Aundra Dubin for cardiology On Amiodarone Was on apixaban 5 mg BID - last dose 08/26/2016  5. ASCENDING AORTIC ANEURYSM (I71.2) Impression: Followed by Dr. Servando Snare  6. History of Crohn's disesase Prior ileocecectomy around 25. He required a second operation. He had a fistula that took months to heal. He did not have straight forward surgery. He has has some prior SBO secondary to adhesions from that surgery 7. History of PE 8. History of Hep C - from blood transfusion during his bowel resection in the 1970's 9. Nephrolithiasis  10. Post tramatic stress syndrome - from service  On Paxil at home, will give Valium while NPO (he cannot take Ativan) 11. Chronic renal insufficiency Creatinine - 1.41 - 08/28/2016  Seen by Dr. Lenna Sciara. Deterding q 3 months  12.  DVT prophylaxis - On hold because of recent Eliquis 13.  Nutrition - to start TPN 14.  Anemia, acute blood loss  Hgb - 8.6 -08/28/2016   Active Problems:   Bowel perforation  (HCC)   Colonic ischemia (HCC)   Pain of upper abdomen  Subjective:  Doing okay - he is having abdominal pain from his incision  Wife in the room  Objective:   Vitals:   08/28/16 0500 08/28/16 0600  BP: (!) 92/55 (!) 94/56  Pulse: 87 84  Resp: 20 (!) 26  Temp:       Intake/Output from previous day:  01/25 0701 - 01/26 0700 In: 6050 [I.V.:4600; IV Piggyback:1450] Out: 1310 [Urine:590; Drains:220; Blood:500]  Intake/Output this shift:  Total I/O In: -  Out: 200 [Urine:200]   Physical Exam:   General: WN WM who is alert and oriented.    HEENT: Normal. Pupils equal. .   Lungs: Junky.  IS = 700 cc   Abdomen: Quiet.  Ileostomy in RUQ   Wound: Packed.  To start dressing changes today.   Lab Results:    Recent Labs  08/27/16 2010 08/28/16 0500  WBC 18.7* 21.3*  HGB 9.7* 8.6*  HCT 30.1* 25.8*  PLT 302 239    BMET   Recent Labs  08/27/16 2010 08/28/16 0500  NA 133* 134*  K 4.1 5.0  CL 102 105  CO2 21* 23  GLUCOSE 137* 136*  BUN 28* 25*  CREATININE 1.70* 1.41*  CALCIUM 8.0* 7.5*    PT/INR  No results for input(s): LABPROT, INR in the last 72 hours.  ABG  No results for input(s): PHART, HCO3 in the last 72 hours.  Invalid input(s): PCO2, PO2   Studies/Results:  Ct Abdomen Pelvis Wo Contrast  Result Date: 08/27/2016 CLINICAL DATA:  History of recent colon surgery 13 days ago with abdominal  pain EXAM: CT ABDOMEN AND PELVIS WITHOUT CONTRAST TECHNIQUE: Multidetector CT imaging of the abdomen and pelvis was performed following the standard protocol without IV contrast. COMPARISON:  05/11/2016 FINDINGS: Lower chest: Lung bases are well aerated. Stable nodule is noted along the major fissure on the left unchanged from the prior exam. Mild scarring is noted in the bases bilaterally. Coronary calcifications are seen. Hepatobiliary: The liver is within normal limits. The gallbladder has been surgically removed. Pancreas: Unremarkable. No pancreatic ductal dilatation or  surrounding inflammatory changes. Spleen: Normal in size without focal abnormality. Adrenals/Urinary Tract: Adrenals are within normal limits bilaterally. Bilateral nonobstructing renal calculi are seen. Small renal cysts are noted bilaterally stable from the prior exam. Stomach/Bowel: Postsurgical changes are noted in the midportion of the transverse colon consistent with the given clinical history. There is a large amount of free air and apparent fecal material extrinsic to the bowel in the right mid abdomen. Considerable free air is noted as well. Changes are consistent with anastomotic breakdown. Some mild extravasated contrast is noted in the right lateral abdomen best seen on image number [id number]. Vascular/Lymphatic: Aortic atherosclerosis. No enlarged abdominal or pelvic lymph nodes. Reproductive: Prostate is unremarkable. Other: No abdominal wall hernia or abnormality. No abdominopelvic ascites. Musculoskeletal: Degenerative changes of lumbar spine are noted. IMPRESSION: Changes consistent with breakdown of recent surgical anastomosis in the right upper abdomen with spillage of both air and apparent fecal material as well as contrast in to the peritoneum. These findings were discussed with the referring surgeon Dr. [name] at the time of exam interpretation. Stable nonobstructing renal calculi Stable nodule in the left lower lobe Electronically Signed   By: Inez Catalina M.D.   On: 08/27/2016 15:17   X-ray Chest Pa Or Ap  Result Date: 08/27/2016 CLINICAL DATA:  Central line placement. EXAM: CHEST 1 VIEW COMPARISON:  Earlier today. FINDINGS: Interval enlargement of the cardiac silhouette. Resolved free peritoneal air. Mild bibasilar atelectasis. Interval right jugular catheter with its tip at the superior cavoatrial junction. No pneumothorax. Aortic arch calcification. Diffuse osteopenia, thoracic spine degenerative changes and left humeral head prosthesis. IMPRESSION: 1. Right jugular catheter tip at the  superior cavoatrial junction without pneumothorax. 2. Interval cardiomegaly and mild bibasilar atelectasis. 3. Aortic atherosclerosis. Electronically Signed   By: Claudie Revering M.D.   On: 08/27/2016 21:03   Dg Chest 2 View  Result Date: 08/27/2016 CLINICAL DATA:  Cough, congestion, recent colon section 08/14/2016 EXAM: CHEST  2 VIEW COMPARISON:  05/30/2016 FINDINGS: Large amount of pneumoperitoneum beneath the hemidiaphragms. Mild cardiomegaly with basilar atelectasis. No current edema, effusion, or pneumothorax. Trachea is midline. Atherosclerosis noted of the aorta and degenerative changes of the spine. Previous left shoulder hemiarthroplasty partially imaged. IMPRESSION: Large amount of pneumoperitoneum, more than would be expected at 13 days postop. Cardiomegaly and basilar atelectasis Thoracic aortic atherosclerosis These results were called by telephone at the time of interpretation on 08/27/2016 at 8:56 am to Dr. Sharilyn Sites , who verbally acknowledged these results. Electronically Signed   By: Jerilynn Mages.  Shick M.D.   On: 08/27/2016 08:57     Anti-infectives:   Anti-infectives    Start     Dose/Rate Route Frequency Ordered Stop   08/28/16 0045  piperacillin-tazobactam (ZOSYN) IVPB 3.375 g  Status:  Discontinued     3.375 g 12.5 mL/hr over 240 Minutes Intravenous Every 8 hours 08/28/16 0033 08/28/16 0038   08/28/16 0034  piperacillin-tazobactam (ZOSYN) IVPB 3.375 g     3.375 g 12.5 mL/hr over 240  Minutes Intravenous Every 8 hours 08/28/16 0034     08/28/16 0015  vancomycin (VANCOCIN) IVPB 750 mg/150 ml premix     750 mg 150 mL/hr over 60 Minutes Intravenous Every 12 hours 08/28/16 0001     08/28/16 0000  fluconazole (DIFLUCAN) IVPB 400 mg     400 mg 100 mL/hr over 120 Minutes Intravenous Every 24 hours 08/28/16 0000     08/27/16 1715  piperacillin-tazobactam (ZOSYN) IVPB 3.375 g     3.375 g 100 mL/hr over 30 Minutes Intravenous  Once 08/27/16 1709 08/27/16 1631      [name], MD,  FACS Pager: [facility]: [phone] 08/28/2016

## 2016-08-28 NOTE — Progress Notes (Signed)
Initial Nutrition Assessment  DOCUMENTATION CODES:   Not applicable  INTERVENTION:  - TPN per Pharmacy. - RD will follow-up 1/29.  NUTRITION DIAGNOSIS:   Inadequate oral intake related to inability to eat as evidenced by NPO status.  GOAL:   Patient will meet greater than or equal to 90% of their needs  MONITOR:   Weight trends, Labs, Skin, I & O's, Other (Comment) (TPN regimen)  REASON FOR ASSESSMENT:   Consult New TPN/TNA  ASSESSMENT:   Pt with extensive history and s/p laparoscopic resection of transverse colon and enterolysis of adhesion for 2 hours on 08/14/16 by Dr. [name].  Post op he had some gout and got some steroids for this he was on a soft diet and ready for discharge on the 7th post op day.  He has developed a cough and was sent to the ED for evaluation after xray showed a significant amount of free air.  Pt still has abdominal pain.  He cannot lie down and sleeps in the chair at home.  He started coughing and called his PCP yesterday.  He denies SOB and sats are good on RA here in the ED lying down off O2.  He has a productive cough and is uncomfortable with that.  He also has pain sitting up or lying down.  He was sent after the CXR showed free air under the diaphragm.   Pt seen for consult. BMI indicates overweight status. Pt has been NPO since admission. Pt being helped by RN and tech at time of attempted first visit and pt sleeping at time of attempted second visit. Wife was on the phone outside of pt's room and requested that he be allowed to sleep as he has gotten very little rest. Wife left the floor shortly after this interaction. Unable to obtain PTA information at this time.  Pt is POD #1 ex lap with LOA, ileostomy, and R colectomy with end ileostomy and Hartmann's pouch. On 08/14/16 pt had open resection of transverse colon.   Unable to perform physical assessment at this time and no visible muscle or fat wasting noted. Physical assessment was performed by  another RD on 08/19/16 during that hospitalization and no muscle or fat wasting were noted at that time. Per chart review, pt has lost 9 lbs (4.5% body weight) in the past 1 week which is significant for time frame. Will closely monitor weight trends during hospitalization. Unable to state malnutrition at this time but will document further findings at follow-up.  Medications reviewed; 20 mg IV Pepcid/day, sliding scale Novolog, PRN IV Zofran.  Labs reviewed; Na: 134 mmol/L, BUN: 25 mg/dL, creatinine: 1.41 mg/dL, Ca: 7.5 mg/dL, Alk Phos elevated, GFR: 48 mL/min.   IVF: D5-NS @ 125 mL/hr from 0000-1800 today and then to decrease to 95 mL/hr (388 kcal) once TPN starts tonight.   Diet Order:  Diet NPO time specified Except for: Ice Chips .TPN (CLINIMIX-E) Adult  Skin:  Wound (see comment) (Abdominal incision from surgery 1/25)  Last BM:  1/25  Height:   Ht Readings from Last 1 Encounters:  08/28/16 6' (1.829 m)    Weight:   Wt Readings from Last 1 Encounters:  08/28/16 189 lb 13.1 oz (86.1 kg)    Ideal Body Weight:  80.91 kg  BMI:  Body mass index is 25.74 kg/m.  Estimated Nutritional Needs:   Kcal:  5003-7048 (25-28 kcal/kg)  Protein:  103-121 grams (1.2-1.4 grams/kg)  Fluid:  >/= 2 L/day  EDUCATION NEEDS:  No education needs identified at this time    Jarome Matin, MS, RD, LDN, [facility] Inpatient Clinical Dietitian Pager # [phone] After hours/weekend pager # [phone]

## 2016-08-29 ENCOUNTER — Inpatient Hospital Stay (HOSPITAL_COMMUNITY): Payer: Medicare Other

## 2016-08-29 DIAGNOSIS — I480 Paroxysmal atrial fibrillation: Secondary | ICD-10-CM

## 2016-08-29 DIAGNOSIS — D649 Anemia, unspecified: Secondary | ICD-10-CM

## 2016-08-29 LAB — CBC
HCT: 23.5 % — ABNORMAL LOW (ref 39.0–52.0)
HEMATOCRIT: 22.6 % — AB (ref 39.0–52.0)
HEMOGLOBIN: 7.7 g/dL — AB (ref 13.0–17.0)
HEMOGLOBIN: 7.4 g/dL — AB (ref 13.0–17.0)
MCH: 28.3 pg (ref 26.0–34.0)
MCH: 27.8 pg (ref 26.0–34.0)
MCHC: 32.8 g/dL (ref 30.0–36.0)
MCHC: 32.7 g/dL (ref 30.0–36.0)
MCV: 86.4 fL (ref 78.0–100.0)
MCV: 85 fL (ref 78.0–100.0)
Platelets: 151 10*3/uL (ref 150–400)
Platelets: 252 10*3/uL (ref 150–400)
RBC: 2.72 MIL/uL — AB (ref 4.22–5.81)
RBC: 2.66 MIL/uL — ABNORMAL LOW (ref 4.22–5.81)
RDW: 13.7 % (ref 11.5–15.5)
RDW: 13.8 % (ref 11.5–15.5)
WBC: 14.4 10*3/uL — ABNORMAL HIGH (ref 4.0–10.5)
WBC: 16.6 10*3/uL — ABNORMAL HIGH (ref 4.0–10.5)

## 2016-08-29 LAB — COMPREHENSIVE METABOLIC PANEL
ALK PHOS: 89 U/L (ref 38–126)
ALT: 25 U/L (ref 17–63)
ANION GAP: 6 (ref 5–15)
AST: 17 U/L (ref 15–41)
Albumin: 1.9 g/dL — ABNORMAL LOW (ref 3.5–5.0)
BUN: 19 mg/dL (ref 6–20)
CO2: 23 mmol/L (ref 22–32)
CREATININE: 1.33 mg/dL — AB (ref 0.61–1.24)
Calcium: 7.5 mg/dL — ABNORMAL LOW (ref 8.9–10.3)
Chloride: 107 mmol/L (ref 101–111)
GFR, EST AFRICAN AMERICAN: 60 mL/min — AB (ref >60)
GFR, EST NON AFRICAN AMERICAN: 51 mL/min — AB (ref >60)
Glucose, Bld: 170 mg/dL — ABNORMAL HIGH (ref 65–99)
Potassium: 4.3 mmol/L (ref 3.5–5.1)
Sodium: 136 mmol/L (ref 135–145)
TOTAL PROTEIN: 4.6 g/dL — AB (ref 6.5–8.1)
Total Bilirubin: 0.6 mg/dL (ref 0.3–1.2)

## 2016-08-29 LAB — GLUCOSE, CAPILLARY
GLUCOSE-CAPILLARY: 153 mg/dL — AB (ref 65–99)
Glucose-Capillary: 121 mg/dL — ABNORMAL HIGH (ref 65–99)
Glucose-Capillary: 128 mg/dL — ABNORMAL HIGH (ref 65–99)
Glucose-Capillary: 129 mg/dL — ABNORMAL HIGH (ref 65–99)
Glucose-Capillary: 141 mg/dL — ABNORMAL HIGH (ref 65–99)
Glucose-Capillary: 141 mg/dL — ABNORMAL HIGH (ref 65–99)
Glucose-Capillary: 150 mg/dL — ABNORMAL HIGH (ref 65–99)

## 2016-08-29 LAB — DIFFERENTIAL
Basophils Absolute: 0 10*3/uL (ref 0.0–0.1)
Basophils Relative: 0 %
EOS ABS: 0.2 10*3/uL (ref 0.0–0.7)
Eosinophils Relative: 1 %
LYMPHS ABS: 0.4 10*3/uL — AB (ref 0.7–4.0)
Lymphocytes Relative: 3 %
MONO ABS: 0.7 10*3/uL (ref 0.1–1.0)
MONOS PCT: 5 %
Neutro Abs: 13.1 10*3/uL — ABNORMAL HIGH (ref 1.7–7.7)
Neutrophils Relative %: 91 %

## 2016-08-29 LAB — TRIGLYCERIDES: Triglycerides: 85 mg/dL (ref <150)

## 2016-08-29 LAB — PREALBUMIN: PREALBUMIN: 10 mg/dL — AB (ref 18–38)

## 2016-08-29 LAB — MAGNESIUM: MAGNESIUM: 1.6 mg/dL — AB (ref 1.7–2.4)

## 2016-08-29 LAB — PHOSPHORUS: PHOSPHORUS: 3.8 mg/dL (ref 2.5–4.6)

## 2016-08-29 MED ORDER — AMIODARONE HCL 200 MG PO TABS
200.0000 mg | ORAL_TABLET | Freq: Two times a day (BID) | ORAL | Status: DC
  Administered 2016-08-29 – 2016-09-14 (×32): 200 mg via ORAL
  Filled 2016-08-29 (×32): qty 1

## 2016-08-29 MED ORDER — PANTOPRAZOLE SODIUM 40 MG PO TBEC
40.0000 mg | DELAYED_RELEASE_TABLET | Freq: Every day | ORAL | Status: DC
  Administered 2016-08-29 – 2016-09-14 (×17): 40 mg via ORAL
  Filled 2016-08-29 (×17): qty 1

## 2016-08-29 MED ORDER — DEXTROSE-NACL 5-0.9 % IV SOLN
INTRAVENOUS | Status: AC
  Administered 2016-08-30: via INTRAVENOUS

## 2016-08-29 MED ORDER — MAGNESIUM SULFATE 2 GM/50ML IV SOLN
2.0000 g | Freq: Once | INTRAVENOUS | Status: AC
  Administered 2016-08-29: 2 g via INTRAVENOUS
  Filled 2016-08-29: qty 50

## 2016-08-29 MED ORDER — M.V.I. ADULT IV INJ
INTRAVENOUS | Status: AC
  Administered 2016-08-29: 17:00:00 via INTRAVENOUS
  Filled 2016-08-29 (×2): qty 1440

## 2016-08-29 MED ORDER — TRAZODONE HCL 50 MG PO TABS
150.0000 mg | ORAL_TABLET | Freq: Every evening | ORAL | Status: DC | PRN
  Administered 2016-08-31 – 2016-09-01 (×2): 150 mg via ORAL
  Filled 2016-08-29 (×2): qty 3

## 2016-08-29 NOTE — Progress Notes (Signed)
Patient ID: MAL ASHER, male   DOB: Jul 27, 1941, 76 y.o.   MRN: [id number] 2 Days Post-Op   Subjective: Some abdominal pain but not severe and controlled with medications. Denies nausea or shortness of breath.  Objective: Vital signs in last 24 hours: Temp:  [97.5 F (36.4 C)-97.9 F (36.6 C)] 97.5 F (36.4 C) (01/27 0000) Pulse Rate:  [82-97] 87 (01/27 0600) Resp:  [20-34] 25 (01/27 0600) BP: (88-117)/(29-73) 111/54 (01/27 0600) SpO2:  [87 %-100 %] 93 % (01/27 0600) Weight:  [86.1 kg (189 lb 13.1 oz)] 86.1 kg (189 lb 13.1 oz) (01/26 1000) Last BM Date: 08/27/16  Intake/Output from previous day: 01/26 0701 - 01/27 0700 In: 4287 [I.V.:3037; IV Piggyback:1250] Out: 1980 [Urine:1870; Drains:110] Intake/Output this shift: No intake/output data recorded.  General appearance: alert, cooperative and no distress Resp: clear to auscultation bilaterally GI: minimal tenderness. Nondistended. Small amount of stool and ileostomy. Incision/Wound: wound packed, dressing clean and dry  Lab Results:   Recent Labs  08/28/16 0500 08/29/16 0331  WBC 21.3* 14.4*  HGB 8.6* 7.7*  HCT 25.8* 23.5*  PLT 239 151   BMET  Recent Labs  08/28/16 0500 08/29/16 0331  NA 134* 136  K 5.0 4.3  CL 105 107  CO2 23 23  GLUCOSE 136* 170*  BUN 25* 19  CREATININE 1.41* 1.33*  CALCIUM 7.5* 7.5*     Studies/Results: Ct Abdomen Pelvis Wo Contrast  Result Date: 08/27/2016 CLINICAL DATA:  History of recent colon surgery 13 days ago with abdominal pain EXAM: CT ABDOMEN AND PELVIS WITHOUT CONTRAST TECHNIQUE: Multidetector CT imaging of the abdomen and pelvis was performed following the standard protocol without IV contrast. COMPARISON:  05/11/2016 FINDINGS: Lower chest: Lung bases are well aerated. Stable nodule is noted along the major fissure on the left unchanged from the prior exam. Mild scarring is noted in the bases bilaterally. Coronary calcifications are seen. Hepatobiliary: The liver is  within normal limits. The gallbladder has been surgically removed. Pancreas: Unremarkable. No pancreatic ductal dilatation or surrounding inflammatory changes. Spleen: Normal in size without focal abnormality. Adrenals/Urinary Tract: Adrenals are within normal limits bilaterally. Bilateral nonobstructing renal calculi are seen. Small renal cysts are noted bilaterally stable from the prior exam. Stomach/Bowel: Postsurgical changes are noted in the midportion of the transverse colon consistent with the given clinical history. There is a large amount of free air and apparent fecal material extrinsic to the bowel in the right mid abdomen. Considerable free air is noted as well. Changes are consistent with anastomotic breakdown. Some mild extravasated contrast is noted in the right lateral abdomen best seen on image number [id number]. Vascular/Lymphatic: Aortic atherosclerosis. No enlarged abdominal or pelvic lymph nodes. Reproductive: Prostate is unremarkable. Other: No abdominal wall hernia or abnormality. No abdominopelvic ascites. Musculoskeletal: Degenerative changes of lumbar spine are noted. IMPRESSION: Changes consistent with breakdown of recent surgical anastomosis in the right upper abdomen with spillage of both air and apparent fecal material as well as contrast in to the peritoneum. These findings were discussed with the referring surgeon Dr. [name] at the time of exam interpretation. Stable nonobstructing renal calculi Stable nodule in the left lower lobe Electronically Signed   By: Inez Catalina M.D.   On: 08/27/2016 15:17   X-ray Chest Pa Or Ap  Result Date: 08/27/2016 CLINICAL DATA:  Central line placement. EXAM: CHEST 1 VIEW COMPARISON:  Earlier today. FINDINGS: Interval enlargement of the cardiac silhouette. Resolved free peritoneal air. Mild bibasilar atelectasis. Interval right jugular catheter with  its tip at the superior cavoatrial junction. No pneumothorax. Aortic arch calcification. Diffuse  osteopenia, thoracic spine degenerative changes and left humeral head prosthesis. IMPRESSION: 1. Right jugular catheter tip at the superior cavoatrial junction without pneumothorax. 2. Interval cardiomegaly and mild bibasilar atelectasis. 3. Aortic atherosclerosis. Electronically Signed   By: Claudie Revering M.D.   On: 08/27/2016 21:03   Dg Chest 2 View  Result Date: 08/27/2016 CLINICAL DATA:  Cough, congestion, recent colon section 08/14/2016 EXAM: CHEST  2 VIEW COMPARISON:  05/30/2016 FINDINGS: Large amount of pneumoperitoneum beneath the hemidiaphragms. Mild cardiomegaly with basilar atelectasis. No current edema, effusion, or pneumothorax. Trachea is midline. Atherosclerosis noted of the aorta and degenerative changes of the spine. Previous left shoulder hemiarthroplasty partially imaged. IMPRESSION: Large amount of pneumoperitoneum, more than would be expected at 13 days postop. Cardiomegaly and basilar atelectasis Thoracic aortic atherosclerosis These results were called by telephone at the time of interpretation on 08/27/2016 at 8:56 am to Dr. Sharilyn Sites , who verbally acknowledged these results. Electronically Signed   By: Jerilynn Mages.  Shick M.D.   On: 08/27/2016 08:57   Dg Chest Port 1 View  Result Date: 08/29/2016 CLINICAL DATA:  76 year old male with possible pneumonia. EXAM: PORTABLE CHEST 1 VIEW COMPARISON:  Chest radiograph dated 08/27/2016 FINDINGS: Right IJ central line with tip over the right atrium in stable positioning. There is shallow inspiration. Bibasilar, left greater right, linear atelectasis/scarring as seen on the prior radiograph. There is no focal consolidation, pleural effusion, or pneumothorax. Stable cardiomegaly. No vascular congestion or edema. Degenerative changes of the spine. Left shoulder hemiarthroplasty. No acute fracture. IMPRESSION: No focal consolidation.  No interval change. Electronically Signed   By: Anner Crete M.D.   On: 08/29/2016 06:12     Anti-infectives: Anti-infectives    Start     Dose/Rate Route Frequency Ordered Stop   08/28/16 0045  piperacillin-tazobactam (ZOSYN) IVPB 3.375 g  Status:  Discontinued     3.375 g 12.5 mL/hr over 240 Minutes Intravenous Every 8 hours 08/28/16 0033 08/28/16 0038   08/28/16 0034  piperacillin-tazobactam (ZOSYN) IVPB 3.375 g     3.375 g 12.5 mL/hr over 240 Minutes Intravenous Every 8 hours 08/28/16 0034     08/28/16 0015  vancomycin (VANCOCIN) IVPB 750 mg/150 ml premix     750 mg 150 mL/hr over 60 Minutes Intravenous Every 12 hours 08/28/16 0001     08/28/16 0000  fluconazole (DIFLUCAN) IVPB 400 mg     400 mg 100 mL/hr over 120 Minutes Intravenous Every 24 hours 08/28/16 0000     08/27/16 1715  piperacillin-tazobactam (ZOSYN) IVPB 3.375 g     3.375 g 100 mL/hr over 30 Minutes Intravenous  Once 08/27/16 1709 08/27/16 1631      Assessment/Plan: s/p Procedure(s): EXPLORATORYLAPAROTOMY, LYSIS OF ADHESIONS, ILEOSTOMY, RIGHT COLECTOMY Improving postoperatively. Good urine output with improving renal function. White blood count decreasing. Postop blood loss anemia, observe. On TNA. Start clear liquid diet. Out of bed. Continue wound care and antibiotics   LOS: 2 days    Navraj Dreibelbis T 08/29/2016

## 2016-08-29 NOTE — Progress Notes (Signed)
PHARMACY - ADULT TOTAL PARENTERAL NUTRITION CONSULT NOTE   Pharmacy Consult for TPN Indication: Post-operative ileus   Patient Measurements: Ht 6'0", Wt 89.9 kg Height: 6' (182.9 cm) Weight: 189 lb 13.1 oz (86.1 kg) IBW/kg (Calculated) : 77.6  Recent Labs  08/28/16 0500 08/29/16 0331  NA 134* 136  K 5.0 4.3  CL 105 107  CO2 23 23  GLUCOSE 136* 170*  BUN 25* 19  CREATININE 1.41* 1.33*  CALCIUM 7.5* 7.5*  PHOS  --  3.8  MG  --  1.6*  ALBUMIN 2.2* 1.9*  ALKPHOS 130* 89  AST 29 17  ALT 36 25  BILITOT 1.5* 0.6   Insulin Requirements: 2 units of SSI in past 24 hours  Current Nutrition: CLD starting 1/27 + TPN  IVF: D5-NS at 95 mL/hr  Central access: 08/27/16: double-lumen CVC TPN start date: 08/28/16  ASSESSMENT                                                                                                          HPI: 76 y/o M s/p laparoscopic resection of transverse colon and enterolysis of adhesions on 08/14/16.  Subsequently was found to have free air under diaphragm and was taken to OR on 08/27/16 for exploratory laparotomy, which found ischemic/infarcted R transverse colon, extensive intra-abdominal adhesions.  Patient underwent R colectomy with end ileostomy and Hartmann's pouch at L transverse colon, and lysis of adhesions x 90 minutes.  Orders received POD#1 to begin TPN with pharmacy assistance.    PMH is extensive and is outlined in the physician H&P from 08/27/16.  Problems of note from a nutrition standpoint include anemia, Vit B12 deficiency (takes 1000 mcg IM every 14 days, reportedly last taken 1/11), CKD III (baseline SCr ~ 1.4 to 1.8) with secondary hyperparathyroidism, enteric hyperoxaluria, gout, atrial fibrillation (on Eliquis PTA) hepatitis C (spontaneously cleared), LV dysfunction (EF 45-50% in 2015, normalized on subsequent echoes).  Significant events:  1/26  POD#1 - in ICU; requiring fluid boluses and phenylephrine drip for pressor support. Spoke with Dr.  [name] - OK to reduce mIVF when TPN begins.  Today, 08/29/2016:   Glucose - mildly elevated; no h/o DM; concurrent stress-dose hydrocortisone noted.  Goal CBGs 100-150 while on TPN.  Electrolytes - Mag 1.6 - already replaced this morning; other lytes WNL  Renal - SCr improved to baseline; h/o CKD III; baseline SCr ~ 1.4-1.8  Hepatic - Alk phos and bilirubin normalized; Transaminases WNL.  TGs -  in process this morning but planning to withhold or limit lipids for first week while ICU status.  Prealbumin - in process  NUTRITIONAL GOALS  RD recs 1/26: Kcal:  9169-4503 (25-28 kcal/kg); Protein: 103-121 grams (1.2-1.4 grams/kg) Clinimix E 5/20 at a goal rate of 83 ml/hr + 20% fat emulsion at 10 ml/hr to provide: 100 g/day protein, 2233 Kcal/day.  PLAN                                                                                                                          At 1800 tonight:  Increase Clinmix-E 5/20 to 60 mL/hr  Reduce mIVF to 65 mL/hr  Please note that due to national shortage, the Clinimix formulas available to [place] are limited and can change on a daily basis.   Trace elements are also on Producer, television/film/video.  As per current [place] standard, trace elements will be provided every other day, multivitamins will be added daily.  Due to ICU status, will withhold lipids for first week of TPN therapy as per ASPEN guidelines. Today will be day #2 of 7.  Continue CBG checks and SSI sensitive scale coverage q4h  TPN labs Mondays and Thursdays. F/u BMET, Mag, Phos in AM.  F/u daily.  Hershal Coria, PharmD, BCPS Pager: [phone] 08/29/2016 8:42 AM

## 2016-08-29 NOTE — Progress Notes (Signed)
PULMONARY / CRITICAL CARE MEDICINE   Name: Derek [name] MRN: [id number] DOB: 1940-10-23    ADMISSION DATE:  08/27/2016 CONSULTATION DATE:  1/25  REFERRING MD:  Dr. [name]  CHIEF COMPLAINT:  Pneumoperitoneum   BRIEF:  76 y/o male with Afib and colon cancer who required emergent exlap on 1/25 for rupture of a surgical anastamosis from recent colon cancer surgery.  PCCM consulted for shock.  SUBJECTIVE:  Feels OK Eating Renal function better  VITAL SIGNS: BP (!) 111/57   Pulse 96   Temp 99.4 F (37.4 C) (Oral)   Resp (!) 33   Ht 6' (1.829 m)   Wt 189 lb 13.1 oz (86.1 kg)   SpO2 90%   BMI 25.74 kg/m  2 ltiers  HEMODYNAMICS: CVP:  [8 mmHg-11 mmHg] 9 mmHg  VENTILATOR SETTINGS:    INTAKE / OUTPUT: I/O last 3 completed shifts: In: 8087 [I.V.:6387; IV Piggyback:1700] Out: 2750 [Urine:2320; Drains:330; Blood:100]  PHYSICAL EXAMINATION: General appearance:  Resting comfortably HENT: NCAT OP clear PULM: CTA B, normal effort CV: Irreg irreg, no mgr GI: dressing, ostomy intact, some bowel sounds noted Neuro: awake, alert, oriented Derm: no rash or skin breakdown  LABS:  BMET  Recent Labs Lab 08/27/16 2010 08/28/16 0500 08/29/16 0331  NA 133* 134* 136  K 4.1 5.0 4.3  CL 102 105 107  CO2 21* 23 23  BUN 28* 25* 19  CREATININE 1.70* 1.41* 1.33*  GLUCOSE 137* 136* 170*    Electrolytes  Recent Labs Lab 08/27/16 2010 08/28/16 0500 08/29/16 0331  CALCIUM 8.0* 7.5* 7.5*  MG  --   --  1.6*  PHOS  --   --  3.8    CBC  Recent Labs Lab 08/27/16 2010 08/28/16 0500 08/29/16 0331  WBC 18.7* 21.3* 14.4*  HGB 9.7* 8.6* 7.7*  HCT 30.1* 25.8* 23.5*  PLT 302 239 151    Coag's No results for input(s): APTT, INR in the last 168 hours.  Sepsis Markers  Recent Labs Lab 08/28/16 0106  LATICACIDVEN 2.7*    ABG No results for input(s): PHART, PCO2ART, PO2ART in the last 168 hours.  Liver Enzymes  Recent Labs Lab 08/27/16 2010 08/28/16 0500  08/29/16 0331  AST 35 29 17  ALT 39 36 25  ALKPHOS 166* 130* 89  BILITOT 1.6* 1.5* 0.6  ALBUMIN 2.6* 2.2* 1.9*    Cardiac Enzymes  Recent Labs Lab 08/28/16 0000  TROPONINI <0.03    Glucose  Recent Labs Lab 08/28/16 0046 08/28/16 2022 08/29/16 0015 08/29/16 0411 08/29/16 0720 08/29/16 1207  GLUCAP 115* 141* 141* 129* 153* 150*    Imaging Dg Chest Port 1 View  Result Date: 08/29/2016 CLINICAL DATA:  76 year old male with possible pneumonia. EXAM: PORTABLE CHEST 1 VIEW COMPARISON:  Chest radiograph dated 08/27/2016 FINDINGS: Right IJ central line with tip over the right atrium in stable positioning. There is shallow inspiration. Bibasilar, left greater right, linear atelectasis/scarring as seen on the prior radiograph. There is no focal consolidation, pleural effusion, or pneumothorax. Stable cardiomegaly. No vascular congestion or edema. Degenerative changes of the spine. Left shoulder hemiarthroplasty. No acute fracture. IMPRESSION: No focal consolidation.  No interval change. Electronically Signed   By: Anner Crete M.D.   On: 08/29/2016 06:12     STUDIES:  CT abdomen 1/25 > Changes consistent with breakdown of recent surgical anastomosis in the right upper abdomen with spillage of both air and apparent fecal material as well as contrast in to the peritoneum. Stable nonobstructing  renal calculi. Stable nodule in the left lower lobe  CULTURES: Wound culture 1/25: few gpc and GNR>>>   ANTIBIOTICS: Zosyn 1/25 > Diflucan 1/25>>>  SIGNIFICANT EVENTS: 1/12-1/19 admit for transverse colon resection 1/25 admit for anastomotic leak. Underwent exploration, lysis of adhesion, ileostomy and right colectomy for ischemic infarct right transverse colon. Got fluid challenges for hypotension.   LINES/TUBES:   DISCUSSION:   ASSESSMENT / PLAN:  Cardiology: A: Septic shock> resolved Hypertension baseline Afib P: Restart amiodarone Monitor BP Tele Hold  anticoagulation  Heme: A: Post op anemia, no clear source of bleeding, hemoynamically stable, likley dilutional P: Monitor bleeding Repeat CBC Transfuse if Hgb < 7gm/dL  Infectious: A: Peritonitis P: F/u cultures Continue antimicrobials as ordered today, likely stop diflucan 1/28  GI A: S/P ex lap, partial colectomy, lysis of adhesion and ileostomy for ischemic bowel on 1/25 Adenocarcinoma colon, recent resection GERD P: Restart PPI oral  Endocrine A: Mild hyperglycemia P: Continue SSI  Roselie Awkward, MD [facility] Pager: [phone] Cell: [phone] After 3pm or if no response, call [phone]

## 2016-08-29 NOTE — Progress Notes (Signed)
eLink Physician-Brief Progress Note Patient Name: Derek [name] DOB: 1941-08-03 MRN: [id number]   Date of Service  08/29/2016  HPI/Events of Note  hypomag  eICU Interventions  Mag replaced     Intervention Category Intermediate Interventions: Electrolyte abnormality - evaluation and management  Elanda Garmany 08/29/2016, 5:12 AM

## 2016-08-30 DIAGNOSIS — F4310 Post-traumatic stress disorder, unspecified: Secondary | ICD-10-CM

## 2016-08-30 DIAGNOSIS — F5101 Primary insomnia: Secondary | ICD-10-CM

## 2016-08-30 LAB — URINALYSIS, COMPLETE (UACMP) WITH MICROSCOPIC
BACTERIA UA: NONE SEEN
BILIRUBIN URINE: NEGATIVE
Glucose, UA: NEGATIVE mg/dL
KETONES UR: NEGATIVE mg/dL
LEUKOCYTES UA: NEGATIVE
NITRITE: NEGATIVE
PROTEIN: NEGATIVE mg/dL
SPECIFIC GRAVITY, URINE: 1.01 (ref 1.005–1.030)
pH: 5 (ref 5.0–8.0)

## 2016-08-30 LAB — CBC
HEMATOCRIT: 19.7 % — AB (ref 39.0–52.0)
Hemoglobin: 6.6 g/dL — CL (ref 13.0–17.0)
MCH: 28.7 pg (ref 26.0–34.0)
MCHC: 33.5 g/dL (ref 30.0–36.0)
MCV: 85.7 fL (ref 78.0–100.0)
PLATELETS: 219 10*3/uL (ref 150–400)
RBC: 2.3 MIL/uL — ABNORMAL LOW (ref 4.22–5.81)
RDW: 13.9 % (ref 11.5–15.5)
WBC: 12.1 10*3/uL — AB (ref 4.0–10.5)

## 2016-08-30 LAB — BASIC METABOLIC PANEL
Anion gap: 5 (ref 5–15)
BUN: 19 mg/dL (ref 6–20)
CHLORIDE: 104 mmol/L (ref 101–111)
CO2: 25 mmol/L (ref 22–32)
CREATININE: 1.26 mg/dL — AB (ref 0.61–1.24)
Calcium: 7.7 mg/dL — ABNORMAL LOW (ref 8.9–10.3)
GFR calc Af Amer: >60 mL/min (ref >60)
GFR calc non Af Amer: 55 mL/min — ABNORMAL LOW (ref >60)
Glucose, Bld: 153 mg/dL — ABNORMAL HIGH (ref 65–99)
POTASSIUM: 4.4 mmol/L (ref 3.5–5.1)
Sodium: 134 mmol/L — ABNORMAL LOW (ref 135–145)

## 2016-08-30 LAB — GLUCOSE, CAPILLARY
GLUCOSE-CAPILLARY: 151 mg/dL — AB (ref 65–99)
GLUCOSE-CAPILLARY: 124 mg/dL — AB (ref 65–99)
GLUCOSE-CAPILLARY: 121 mg/dL — AB (ref 65–99)
GLUCOSE-CAPILLARY: 155 mg/dL — AB (ref 65–99)
Glucose-Capillary: 139 mg/dL — ABNORMAL HIGH (ref 65–99)
Glucose-Capillary: 143 mg/dL — ABNORMAL HIGH (ref 65–99)

## 2016-08-30 LAB — PREPARE RBC (CROSSMATCH)

## 2016-08-30 LAB — HEMOGLOBIN AND HEMATOCRIT, BLOOD
HCT: 21.3 % — ABNORMAL LOW (ref 39.0–52.0)
Hemoglobin: 7.2 g/dL — ABNORMAL LOW (ref 13.0–17.0)

## 2016-08-30 LAB — MAGNESIUM: Magnesium: 1.9 mg/dL (ref 1.7–2.4)

## 2016-08-30 LAB — PHOSPHORUS: PHOSPHORUS: 3.8 mg/dL (ref 2.5–4.6)

## 2016-08-30 MED ORDER — SODIUM CHLORIDE 0.9 % IV SOLN: Freq: Once | INTRAVENOUS | Status: DC

## 2016-08-30 MED ORDER — DEXTROSE-NACL 5-0.9 % IV SOLN
INTRAVENOUS | Status: DC
  Administered 2016-08-30 – 2016-09-01 (×2): via INTRAVENOUS

## 2016-08-30 MED ORDER — LIP MEDEX EX OINT
TOPICAL_OINTMENT | CUTANEOUS | Status: DC | PRN
  Filled 2016-08-30: qty 7

## 2016-08-30 MED ORDER — PAROXETINE HCL 20 MG PO TABS
20.0000 mg | ORAL_TABLET | Freq: Every day | ORAL | Status: DC
  Administered 2016-08-30 – 2016-09-01 (×3): 20 mg via ORAL
  Filled 2016-08-30 (×3): qty 1

## 2016-08-30 MED ORDER — DIPHENHYDRAMINE HCL 50 MG/ML IJ SOLN
INTRAMUSCULAR | Status: AC
  Administered 2016-08-30: 50 mg
  Filled 2016-08-30: qty 1

## 2016-08-30 MED ORDER — ACETAMINOPHEN 325 MG PO TABS
650.0000 mg | ORAL_TABLET | Freq: Four times a day (QID) | ORAL | Status: DC | PRN
  Administered 2016-08-30 – 2016-09-10 (×9): 650 mg via ORAL
  Filled 2016-08-30 (×10): qty 2

## 2016-08-30 MED ORDER — ADULT MULTIVITAMIN W/MINERALS CH
1.0000 | ORAL_TABLET | Freq: Every day | ORAL | Status: DC
  Administered 2016-08-30 – 2016-09-14 (×16): 1 via ORAL
  Filled 2016-08-30 (×16): qty 1

## 2016-08-30 MED ORDER — DIPHENHYDRAMINE HCL 50 MG/ML IJ SOLN: 50.0000 mg | Freq: Once | INTRAMUSCULAR | Status: AC

## 2016-08-30 MED ORDER — CLINIMIX E/DEXTROSE (5/20) 5 % IV SOLN
INTRAVENOUS | Status: AC
  Administered 2016-08-30: 19:00:00 via INTRAVENOUS
  Filled 2016-08-30: qty 1992

## 2016-08-30 NOTE — Progress Notes (Signed)
3 Days Post-Op   Subjective: Still has some significant abdominal pain but improved with medications. Tolerated clear liquids without nausea. He got out of bed to a chair for a little while yesterday.  Objective: Vital signs in last 24 hours: Temp:  [98 F (36.7 C)-99.4 F (37.4 C)] 98 F (36.7 C) (01/28 0348) Pulse Rate:  [86-101] 86 (01/28 0600) Resp:  [18-42] 24 (01/28 0600) BP: (90-136)/(50-82) 114/58 (01/28 0600) SpO2:  [70 %-98 %] 98 % (01/28 0600) Last BM Date: 08/29/16  Intake/Output from previous day: 01/27 0701 - 01/28 0700 In: 3759 [I.V.:3259; IV Piggyback:500] Out: 1190 [Urine:1030; Drains:35; Stool:125] Intake/Output this shift: No intake/output data recorded.  General appearance: alert, cooperative and mild distress Resp: clear to auscultation bilaterally GI: Mild appropriate tenderness. Nondistended. Ileostomy functioning. Incision/Wound: Wound redressed. Tissue healthy  Lab Results:   Recent Labs  08/29/16 0331 08/29/16 1404  WBC 14.4* 16.6*  HGB 7.7* 7.4*  HCT 23.5* 22.6*  PLT 151 252   BMET  Recent Labs  08/29/16 0331 08/30/16 0347  NA 136 134*  K 4.3 4.4  CL 107 104  CO2 23 25  GLUCOSE 170* 153*  BUN 19 19  CREATININE 1.33* 1.26*  CALCIUM 7.5* 7.7*     Studies/Results: Dg Chest Port 1 View  Result Date: 08/29/2016 CLINICAL DATA:  76 year old male with possible pneumonia. EXAM: PORTABLE CHEST 1 VIEW COMPARISON:  Chest radiograph dated 08/27/2016 FINDINGS: Right IJ central line with tip over the right atrium in stable positioning. There is shallow inspiration. Bibasilar, left greater right, linear atelectasis/scarring as seen on the prior radiograph. There is no focal consolidation, pleural effusion, or pneumothorax. Stable cardiomegaly. No vascular congestion or edema. Degenerative changes of the spine. Left shoulder hemiarthroplasty. No acute fracture. IMPRESSION: No focal consolidation.  No interval change. Electronically Signed   By:  Anner Crete M.D.   On: 08/29/2016 06:12    Anti-infectives: Anti-infectives    Start     Dose/Rate Route Frequency Ordered Stop   08/28/16 0045  piperacillin-tazobactam (ZOSYN) IVPB 3.375 g  Status:  Discontinued     3.375 g 12.5 mL/hr over 240 Minutes Intravenous Every 8 hours 08/28/16 0033 08/28/16 0038   08/28/16 0034  piperacillin-tazobactam (ZOSYN) IVPB 3.375 g     3.375 g 12.5 mL/hr over 240 Minutes Intravenous Every 8 hours 08/28/16 0034     08/28/16 0015  vancomycin (VANCOCIN) IVPB 750 mg/150 ml premix  Status:  Discontinued     750 mg 150 mL/hr over 60 Minutes Intravenous Every 12 hours 08/28/16 0001 08/29/16 1454   08/28/16 0000  fluconazole (DIFLUCAN) IVPB 400 mg     400 mg 100 mL/hr over 120 Minutes Intravenous Every 24 hours 08/28/16 0000     08/27/16 1715  piperacillin-tazobactam (ZOSYN) IVPB 3.375 g     3.375 g 100 mL/hr over 30 Minutes Intravenous  Once 08/27/16 1709 08/27/16 1631      Assessment/Plan: s/p Procedure(s): EXPLORATORYLAPAROTOMY, LYSIS OF ADHESIONS, ILEOSTOMY, RIGHT COLECTOMY Appears to be improving. Check CBC today to follow slow decrease in hemoglobin and WBC. Advance to full liquid diet. Out of bed again today. Continue antibiotics and wound care.   LOS: 3 days    Tavien Chestnut T 1/28/2018Patient ID: Derek [name], male   DOB: Jun 03, 1941, 76 y.o.   MRN: [id number]

## 2016-08-30 NOTE — Progress Notes (Addendum)
Labwork has been completed which indicates that this was not a hemolytic reaction and may be a febrile reaction. Second unit of blood has been started with no suspected reaction. RN will continue to monitor.   Bobbye Riggs, RN   Blood Transfusion Reaction Investigation  Transfusion Reaction Investigation:  Patient's Clinical History:  Exploratory laparotomy for rupture of surgical anastomasis from recent colon cancer surgery. Hemoglobin dropped to 6.6, recommendations to transfuse 2 units PRBC.    Interpretation of Reaction:  2 RN verified per protocol. Blood started and running for approximately 8 minutes. Pt education completed on symptoms of transfusion reaction. Pt became tachypenic, reported feeling very uneasy all of a sudden. Blood stopped immediately. Vital signs taken. CBG taken, result 170.    Comments/Recommendations:  50 mg IV benadryl given    Physician Contacted:  Excell Seltzer, MD   Blood Transfusion Reaction (Nursing Documentation):   Suspected Transfusion Reaction  Physician Notified?: Yes  Reaction Symptoms: Chills, Feeling of impending doom, Fever (temp. increase of 2 degrees F), Nausea, Generalized ache  Reaction Interventions: Transfusion stopped, Saline hung with new IV tubing  Time Reaction Noted: 1420  Time Infusion Interrupted: 1420  Volume Received Prior to Suspected Reaction: 16 mL  Unit Number: [id number]  Labs   Group/Rh: 0 positive    DAT-Direct [place]:     Hemolysis- Pre/Post sample:     Icterus-Pre/Post sample:   Labs drawn. Urine specimen pending. Pt due to void.   Chana Bode, RN 08/30/2016 2:53 PM

## 2016-08-30 NOTE — Progress Notes (Addendum)
PULMONARY / CRITICAL CARE MEDICINE   Name: Derek [name] MRN: [id number] DOB: 07/07/1941    ADMISSION DATE:  08/27/2016 CONSULTATION DATE:  1/25  REFERRING MD:  Dr. [name]  CHIEF COMPLAINT:  Pneumoperitoneum   BRIEF:  76 y/o male with Afib and colon cancer who required emergent exlap on 1/25 for rupture of a surgical anastamosis from recent colon cancer surgery.  PCCM consulted for shock.  SUBJECTIVE:  Some issues with anxiety/PTSD yesterday Some abdominal pain but tolerating eating well Has been out of bed  VITAL SIGNS: BP (!) 114/58   Pulse 86   Temp 98.1 F (36.7 C) (Oral)   Resp (!) 24   Ht 6' (1.829 m)   Wt 189 lb 13.1 oz (86.1 kg)   SpO2 98%   BMI 25.74 kg/m  2 ltiers  HEMODYNAMICS: CVP:  [8 mmHg] 8 mmHg  VENTILATOR SETTINGS:    INTAKE / OUTPUT: I/O last 3 completed shifts: In: 6884 [I.V.:5884; IV Piggyback:1000] Out: 2220 [Urine:1800; Drains:145; Stool:275]  PHYSICAL EXAMINATION: General appearance:  Sitting up in chair HENT: NCAT OP Clear PULM: CTA B, normal effort CV: RRR, no mgr GI: BS+, ostomy MSK: normal bulk and tone  LABS:  BMET  Recent Labs Lab 08/28/16 0500 08/29/16 0331 08/30/16 0347  NA 134* 136 134*  K 5.0 4.3 4.4  CL 105 107 104  CO2 23 23 25   BUN 25* 19 19  CREATININE 1.41* 1.33* 1.26*  GLUCOSE 136* 170* 153*    Electrolytes  Recent Labs Lab 08/28/16 0500 08/29/16 0331 08/30/16 0347  CALCIUM 7.5* 7.5* 7.7*  MG  --  1.6* 1.9  PHOS  --  3.8 3.8    CBC  Recent Labs Lab 08/28/16 0500 08/29/16 0331 08/29/16 1404  WBC 21.3* 14.4* 16.6*  HGB 8.6* 7.7* 7.4*  HCT 25.8* 23.5* 22.6*  PLT 239 151 252    Coag's No results for input(s): APTT, INR in the last 168 hours.  Sepsis Markers  Recent Labs Lab 08/28/16 0106  LATICACIDVEN 2.7*    ABG No results for input(s): PHART, PCO2ART, PO2ART in the last 168 hours.  Liver Enzymes  Recent Labs Lab 08/27/16 2010 08/28/16 0500 08/29/16 0331  AST 35  29 17  ALT 39 36 25  ALKPHOS 166* 130* 89  BILITOT 1.6* 1.5* 0.6  ALBUMIN 2.6* 2.2* 1.9*    Cardiac Enzymes  Recent Labs Lab 08/28/16 0000  TROPONINI <0.03    Glucose  Recent Labs Lab 08/29/16 1207 08/29/16 1623 08/29/16 1934 08/29/16 2329 08/30/16 0343 08/30/16 0751  GLUCAP 150* 121* 128* 141* 151* 143*    Imaging No results found.   STUDIES:  CT abdomen 1/25 > Changes consistent with breakdown of recent surgical anastomosis in the right upper abdomen with spillage of both air and apparent fecal material as well as contrast in to the peritoneum. Stable nonobstructing renal calculi. Stable nodule in the left lower lobe  CULTURES: Wound culture 1/25: few gpc and GNR>>> still no growth as of 08/30/2016   ANTIBIOTICS: Zosyn 1/25 > Diflucan 1/25>>> 1/28  SIGNIFICANT EVENTS: 1/12-1/19 admit for transverse colon resection 1/25 admit for anastomotic leak. Underwent exploration, lysis of adhesion, ileostomy and right colectomy for ischemic infarct right transverse colon. Got fluid challenges for hypotension.   LINES/TUBES:   DISCUSSION:   ASSESSMENT / PLAN:  Cardiology: A: Septic shock> resolved Hypertension baseline Afib P: Continue amiodarone Monitor BP Tele Hold anticoagulation  Heme: A: Anemia post op without sign of bleeding P: Repeat CBC Transfuse  if HGB < 7gm/dL  Infectious: A: Peritonitis P: F/u cultures Continue antimicrobials Stop diflucan  GI A: S/P ex lap, partial colectomy, lysis of adhesion and ileostomy for ischemic bowel on 1/25 Adenocarcinoma colon, recent resection GERD P: Continue PPI  Endocrine A: Mild hyperglycemia > well controlled P: Continue SSI  Psych: A: Insomnia PTSD P: Restart paxil Trazodone qHS  PCCM will sign off, please consult TRH if desired continued medical support  Roselie Awkward, MD [facility] Pager: [phone] Cell: [phone] After 3pm or if no response, call [phone]

## 2016-08-30 NOTE — Progress Notes (Addendum)
PHARMACY - ADULT TOTAL PARENTERAL NUTRITION CONSULT NOTE   Pharmacy Consult for TPN Indication: Post-operative ileus   Patient Measurements: Ht 6'0", Wt 89.9 kg Height: 6' (182.9 cm) Weight: 189 lb 13.1 oz (86.1 kg) IBW/kg (Calculated) : 77.6  Recent Labs  08/28/16 0500 08/29/16 0331 08/30/16 0347  NA 134* 136 134*  K 5.0 4.3 4.4  CL 105 107 104  CO2 23 23 25   GLUCOSE 136* 170* 153*  BUN 25* 19 19  CREATININE 1.41* 1.33* 1.26*  CALCIUM 7.5* 7.5* 7.7*  PHOS  --  3.8 3.8  MG  --  1.6* 1.9  ALBUMIN 2.2* 1.9*  --   ALKPHOS 130* 89  --   AST 29 17  --   ALT 36 25  --   BILITOT 1.5* 0.6  --   TRIG  --  85  --   PREALBUMIN  --  10.0*  --    Insulin Requirements: 7 units of SSI in past 24 hours  Current Nutrition: FLD starting 1/28  IVF: D5-NS at 55 mL/hr  Central access: 08/27/16: double-lumen CVC TPN start date: 08/28/16  ASSESSMENT                                                                                                          HPI: 76 y/o M s/p laparoscopic resection of transverse colon and enterolysis of adhesions on 08/14/16.  Subsequently was found to have free air under diaphragm and was taken to OR on 08/27/16 for exploratory laparotomy, which found ischemic/infarcted R transverse colon, extensive intra-abdominal adhesions.  Patient underwent R colectomy with end ileostomy and Hartmann's pouch at L transverse colon, and lysis of adhesions x 90 minutes.  Orders received POD#1 to begin TPN with pharmacy assistance.    PMH is extensive and is outlined in the physician H&P from 08/27/16.  Problems of note from a nutrition standpoint include anemia, Vit B12 deficiency (takes 1000 mcg IM every 14 days, reportedly last taken 1/11), CKD III (baseline SCr ~ 1.4 to 1.8) with secondary hyperparathyroidism, enteric hyperoxaluria, gout, atrial fibrillation (on Eliquis PTA) hepatitis C (spontaneously cleared), LV dysfunction (EF 45-50% in 2015, normalized on subsequent  echoes).  Significant events:  1/26  POD#1 - in ICU; requiring fluid boluses and phenylephrine drip for pressor support. Spoke with Dr. [name] - OK to reduce mIVF when TPN begins.  Today, 08/30/2016:   Glucose - controlled, no h/o DM; Goal CBGs 100-150 while on TPN.  Electrolytes - Na 134; other lytes WNL including corrected calcium  Renal - SCr improved; h/o CKD III; baseline SCr ~ 1.4-1.8  Hepatic - Alk phos and bilirubin normalized; Transaminases WNL.  TGs -  85 (1/27)  Prealbumin - 10 (1/27)  NUTRITIONAL GOALS  RD recs 1/26: Kcal:  2633-3545 (25-28 kcal/kg); Protein: 103-121 grams (1.2-1.4 grams/kg) Clinimix E 5/20 at a goal rate of 83 ml/hr + 20% fat emulsion at 27m/day to provide: 100 g/day protein, 2233 Kcal/day.  PLAN                                                                                                                          At 1800 tonight:  Increase Clinmix-E 5/20 to 83 mL/hr  Reduce mIVF to 40 mL/hr  Please note that due to national shortage, the Clinimix formulas available to [place] and can change on a daily basis.   Multivitamin with minerals tablet once daily.  Due to ICU status, will withhold lipids for first week of TPN therapy as per ASPEN guidelines. Today will be day #3 of 7.  Continue CBG checks and SSI sensitive scale coverage q4h  TPN labs Mondays and Thursdays.  F/u tolerance to advancement of diet and potential weaning of TPN.  AHershal Coria PharmD, BCPS Pager: [phone]/28/2018 8:22 AM

## 2016-08-30 NOTE — Anesthesia Postprocedure Evaluation (Signed)
Anesthesia Post Note  Patient: Derek [name]  Procedure(s) Performed: Procedure(s) (LRB): EXPLORATORYLAPAROTOMY, LYSIS OF ADHESIONS, ILEOSTOMY, RIGHT COLECTOMY (N/A)  Patient location during evaluation: ICU Anesthesia Type: General Level of consciousness: sedated and patient cooperative Pain management: pain level not controlled Vital Signs Assessment: post-procedure vital signs reviewed and stable Respiratory status: spontaneous breathing Cardiovascular status: stable Anesthetic complications: no        Last Vitals:  Vitals:   08/30/16 1900 08/30/16 1956  BP: (!) 102/55   Pulse: 89   Resp: (!) 26   Temp:  36.7 C    Last Pain:  Vitals:   08/30/16 1956  TempSrc: Oral  PainSc:    Pain Goal: Patients Stated Pain Goal: 8 (08/30/16 0535)               Nolon Nations

## 2016-08-31 LAB — COMPREHENSIVE METABOLIC PANEL
ALBUMIN: 1.8 g/dL — AB (ref 3.5–5.0)
ALK PHOS: 143 U/L — AB (ref 38–126)
ALT: 44 U/L (ref 17–63)
AST: 49 U/L — AB (ref 15–41)
Anion gap: 6 (ref 5–15)
BUN: 20 mg/dL (ref 6–20)
CALCIUM: 7.9 mg/dL — AB (ref 8.9–10.3)
CO2: 25 mmol/L (ref 22–32)
Chloride: 100 mmol/L — ABNORMAL LOW (ref 101–111)
Creatinine, Ser: 1.36 mg/dL — ABNORMAL HIGH (ref 0.61–1.24)
GFR calc Af Amer: 58 mL/min — ABNORMAL LOW (ref >60)
GFR calc non Af Amer: 50 mL/min — ABNORMAL LOW (ref >60)
GLUCOSE: 145 mg/dL — AB (ref 65–99)
Potassium: 4 mmol/L (ref 3.5–5.1)
SODIUM: 131 mmol/L — AB (ref 135–145)
TOTAL PROTEIN: 4.6 g/dL — AB (ref 6.5–8.1)
Total Bilirubin: 1.2 mg/dL (ref 0.3–1.2)

## 2016-08-31 LAB — DIFFERENTIAL
Basophils Absolute: 0 10*3/uL (ref 0.0–0.1)
Basophils Relative: 0 %
EOS PCT: 2 %
Eosinophils Absolute: 0.2 10*3/uL (ref 0.0–0.7)
LYMPHS PCT: 6 %
Lymphs Abs: 0.5 10*3/uL — ABNORMAL LOW (ref 0.7–4.0)
MONO ABS: 0.5 10*3/uL (ref 0.1–1.0)
MONOS PCT: 6 %
NEUTROS ABS: 7.2 10*3/uL (ref 1.7–7.7)
Neutrophils Relative %: 86 %

## 2016-08-31 LAB — TYPE AND SCREEN
ABO/RH(D): O POS
Antibody Screen: NEGATIVE
UNIT DIVISION: 0
Unit division: 0

## 2016-08-31 LAB — GLUCOSE, CAPILLARY
GLUCOSE-CAPILLARY: 132 mg/dL — AB (ref 65–99)
GLUCOSE-CAPILLARY: 126 mg/dL — AB (ref 65–99)
Glucose-Capillary: 170 mg/dL — ABNORMAL HIGH (ref 65–99)
Glucose-Capillary: 161 mg/dL — ABNORMAL HIGH (ref 65–99)
Glucose-Capillary: 159 mg/dL — ABNORMAL HIGH (ref 65–99)
Glucose-Capillary: 123 mg/dL — ABNORMAL HIGH (ref 65–99)
Glucose-Capillary: 133 mg/dL — ABNORMAL HIGH (ref 65–99)

## 2016-08-31 LAB — PREALBUMIN: Prealbumin: 5.8 mg/dL — ABNORMAL LOW (ref 18–38)

## 2016-08-31 LAB — CBC
HEMATOCRIT: 21.7 % — AB (ref 39.0–52.0)
Hemoglobin: 7.3 g/dL — ABNORMAL LOW (ref 13.0–17.0)
MCH: 28.4 pg (ref 26.0–34.0)
MCHC: 33.6 g/dL (ref 30.0–36.0)
MCV: 84.4 fL (ref 78.0–100.0)
PLATELETS: 186 10*3/uL (ref 150–400)
RBC: 2.57 MIL/uL — ABNORMAL LOW (ref 4.22–5.81)
RDW: 13.9 % (ref 11.5–15.5)
WBC: 8.3 10*3/uL (ref 4.0–10.5)

## 2016-08-31 LAB — HEMOGLOBIN AND HEMATOCRIT, BLOOD
HEMATOCRIT: 22.8 % — AB (ref 39.0–52.0)
HEMOGLOBIN: 7.7 g/dL — AB (ref 13.0–17.0)

## 2016-08-31 LAB — PREPARE RBC (CROSSMATCH)

## 2016-08-31 LAB — TRIGLYCERIDES: TRIGLYCERIDES: 94 mg/dL (ref <150)

## 2016-08-31 LAB — MAGNESIUM: Magnesium: 1.6 mg/dL — ABNORMAL LOW (ref 1.7–2.4)

## 2016-08-31 LAB — PHOSPHORUS: Phosphorus: 3.7 mg/dL (ref 2.5–4.6)

## 2016-08-31 MED ORDER — ENSURE ENLIVE PO LIQD: 237.0000 mL | Freq: Two times a day (BID) | ORAL | Status: DC

## 2016-08-31 MED ORDER — TRACE MINERALS CR-CU-MN-SE-ZN 10-1000-500-60 MCG/ML IV SOLN
INTRAVENOUS | Status: AC
  Administered 2016-08-31: 18:00:00 via INTRAVENOUS
  Filled 2016-08-31 (×2): qty 1440

## 2016-08-31 MED ORDER — FEBUXOSTAT 40 MG PO TABS
80.0000 mg | ORAL_TABLET | Freq: Every day | ORAL | Status: DC
  Administered 2016-09-01 – 2016-09-14 (×14): 80 mg via ORAL
  Filled 2016-08-31 (×14): qty 2

## 2016-08-31 MED ORDER — INSULIN ASPART 100 UNIT/ML ~~LOC~~ SOLN
0.0000 [IU] | SUBCUTANEOUS | Status: DC
  Administered 2016-08-31: 3 [IU] via SUBCUTANEOUS
  Administered 2016-08-31 (×3): 2 [IU] via SUBCUTANEOUS
  Administered 2016-09-01: 3 [IU] via SUBCUTANEOUS
  Administered 2016-09-01: 2 [IU] via SUBCUTANEOUS
  Administered 2016-09-01 (×2): 3 [IU] via SUBCUTANEOUS
  Administered 2016-09-01: 2 [IU] via SUBCUTANEOUS
  Administered 2016-09-01: 3 [IU] via SUBCUTANEOUS
  Administered 2016-09-02 (×2): 2 [IU] via SUBCUTANEOUS

## 2016-08-31 MED ORDER — MAGNESIUM SULFATE IN D5W 1-5 GM/100ML-% IV SOLN
1.0000 g | Freq: Once | INTRAVENOUS | Status: AC
  Administered 2016-08-31: 1 g via INTRAVENOUS
  Filled 2016-08-31: qty 100

## 2016-08-31 MED ORDER — PREMIER PROTEIN SHAKE
11.0000 [oz_av] | Freq: Two times a day (BID) | ORAL | Status: DC
  Administered 2016-08-31 – 2016-09-02 (×3): 11 [oz_av] via ORAL
  Filled 2016-08-31 (×10): qty 325.31

## 2016-08-31 MED ORDER — HYDROCODONE-ACETAMINOPHEN 5-325 MG PO TABS
1.0000 | ORAL_TABLET | ORAL | Status: DC | PRN
Start: 2016-08-31 — End: 2016-09-14
  Administered 2016-08-31 – 2016-09-01 (×3): 2 via ORAL
  Administered 2016-09-02: 1 via ORAL
  Administered 2016-09-02 (×2): 2 via ORAL
  Administered 2016-09-03: 1 via ORAL
  Administered 2016-09-03: 2 via ORAL
  Administered 2016-09-03: 1 via ORAL
  Administered 2016-09-04 (×2): 2 via ORAL
  Administered 2016-09-06 – 2016-09-12 (×8): 1 via ORAL
  Administered 2016-09-12: 2 via ORAL
  Administered 2016-09-13 – 2016-09-14 (×4): 1 via ORAL
  Filled 2016-08-31 (×2): qty 1
  Filled 2016-08-31: qty 2
  Filled 2016-08-31: qty 1
  Filled 2016-08-31: qty 2
  Filled 2016-08-31: qty 1
  Filled 2016-08-31: qty 2
  Filled 2016-08-31: qty 1
  Filled 2016-08-31 (×5): qty 2
  Filled 2016-08-31 (×2): qty 1
  Filled 2016-08-31: qty 2
  Filled 2016-08-31 (×5): qty 1
  Filled 2016-08-31: qty 2
  Filled 2016-08-31 (×3): qty 1

## 2016-08-31 MED ORDER — TRACE MINERALS CR-CU-MN-SE-ZN 10-1000-500-60 MCG/ML IV SOLN
INTRAVENOUS | Status: DC
  Filled 2016-08-31: qty 1992

## 2016-08-31 MED ORDER — MAGNESIUM SULFATE 2 GM/50ML IV SOLN
2.0000 g | Freq: Once | INTRAVENOUS | Status: AC
Start: 2016-08-31 — End: 2016-08-31
  Administered 2016-08-31: 2 g via INTRAVENOUS
  Filled 2016-08-31: qty 50

## 2016-08-31 MED ORDER — GUAIFENESIN-DM 100-10 MG/5ML PO SYRP
5.0000 mL | ORAL_SOLUTION | ORAL | Status: DC | PRN
  Administered 2016-08-31 – 2016-09-01 (×7): 5 mL via ORAL
  Filled 2016-08-31 (×8): qty 10

## 2016-08-31 NOTE — Progress Notes (Addendum)
Nutrition Follow-up  DOCUMENTATION CODES:   Not applicable  INTERVENTION:  - Encourage PO intakes. - Will Premier Protein BID, this supplement provides 160 kcal and 30 grams of protein. - TPN per Pharmacy. - RD will follow-up 1/30.  NUTRITION DIAGNOSIS:   Inadequate oral intake related to inability to eat as evidenced by NPO status. - diet advanced to FLD with minimal intake.  GOAL:   Patient will meet greater than or equal to 90% of their needs - minimally met at this time.  MONITOR:   PO intake, Diet advancement, Weight trends, Labs, Skin, I & O's, Other (Comment), Supplement acceptance (TPN regimen)  ASSESSMENT:   Pt with extensive history and s/p laparoscopic resection of transverse colon and enterolysis of adhesion for 2 hours on 08/14/16 by Dr. [name].  Post op he had some gout and got some steroids for this he was on a soft diet and ready for discharge on the 7th post op day.  He has developed a cough and was sent to the ED for evaluation after xray showed a significant amount of free air.  Pt still has abdominal pain.  He cannot lie down and sleeps in the chair at home.  He started coughing and called his PCP yesterday.  He denies SOB and sats are good on RA here in the ED lying down off O2.  He has a productive cough and is uncomfortable with that.  He also has pain sitting up or lying down.  He was sent after the CXR showed free air under the diaphragm.   1/29 Diet advanced from NPO to CLD on 1/27 @ 39 and from CLD to [place] on 1/28 @ 0745 with no intakes documented since diet advancement. Son was at bedside but states he arrived a short time ago and is unsure if pt consumed anything PO this AM. He states that lunch tray of soup and jello have been ordered. During rounds RN reported that she saw breakfast tray with 25% completion. Pt very lethargic during visit and son reported pt was recently given Valium. Son states that from time of abdominal surgery earlier in the month until  this admission pt was not eating well and the only solid foods he had were a biscuit and pancake.   No new weight since admission. Unable to perform physical assessment this visit d/t respect for pt's comfort but will attempt during follow-up tomorrow.   Pt with double lumen CVC to R internal jugular. He is currently receiving Clinimix E 5/20 @ goal rate of 83 mL/hr; ILE being held per ICU protocol with today being day #4/7 of hold. Current TPN regimen + kcal from IVF is providing 1916 kcal (89% minimum estimated kcal need) and 100 grams of protein (97% minimum estimated protein need).   During rounds RN reported hope for diet tolerance and to be able to d/c TPN tomorrow. Will order oral nutrition supplements to assist in this effort. Pt prefers to try Premier Protein rather than Ensure.   Medications reviewed; sliding scale insulin, daily multivitamin with minerals, 2 g IV Mg sulfate x1 dose today, PRN Zofran, 40 mg oral Protonix/day.  Labs reviewed; CBGs: 132 and 161 mg/dL today, Na: 131 mmol/L, Cl: 100 mmol/L, creatinine: 1.36 mg/dL, Ca: 7.9 mg/sL, Mg: 1.6 mg/dL, Alk Phos and AST elevated, triglycerides WDL (94 mg/dL) this AM.   IVF: D5-NS @ 40 mL/hr (163 kcal).    1/26 - Pt has been NPO since admission.  - Pt being helped by RN  and tech at time of attempted first visit and pt sleeping at time of attempted second visit.  - Wife was on the phone outside of pt's room and requested that he be allowed to sleep as he has gotten very little rest.  - Wife left the floor shortly after this interaction.  - Unable to obtain PTA information at this time. - Pt is POD #1 ex lap with LOA, ileostomy, and R colectomy with end ileostomy and Hartmann's pouch. On 08/14/16 pt had open resection of transverse colon.  - Unable to perform physical assessment at this time and no visible muscle or fat wasting noted. - Physical assessment was performed by another RD on 08/19/16 during that hospitalization and no muscle  or fat wasting were noted at that time.  - Per chart review, pt has lost 9 lbs (4.5% body weight) in the past 1 week which is significant for time frame.   IVF: D5-NS @ 125 mL/hr from 0000-1800 today and then to decrease to 95 mL/hr (388 kcal) once TPN starts tonight.    Diet Order:  Diet full liquid Room service appropriate? Yes; Fluid consistency: Thin .TPN (CLINIMIX-E) Adult .TPN (CLINIMIX-E) Adult  Skin:  Wound (see comment) (Abdominal incision from surgery 1/25)  Last BM:  1/29  Height:   Ht Readings from Last 1 Encounters:  08/28/16 6' (1.829 m)    Weight:   Wt Readings from Last 1 Encounters:  08/28/16 189 lb 13.1 oz (86.1 kg)    Ideal Body Weight:  80.91 kg  BMI:  Body mass index is 25.74 kg/m.  Estimated Nutritional Needs:   Kcal:  3818-4037 (25-28 kcal/kg)  Protein:  103-121 grams (1.2-1.4 grams/kg)  Fluid:  >/= 2 L/day  EDUCATION NEEDS:   No education needs identified at this time    Jarome Matin, MS, RD, LDN, CNSC Inpatient Clinical Dietitian Pager # [phone] After hours/weekend pager # [phone]

## 2016-08-31 NOTE — Progress Notes (Signed)
Physical Therapy Treatment Patient Details Name: ALANZO LAMB MRN: [id number] DOB: 07-24-1941 Today's Date: 08/31/2016    History of Present Illness 76 y.o. male with  history colon poylps, AAA,I, Crohn's disease, B12 deficiency, GERD, Gout, Hep C, PE, chronic thrombocytopenia,  PTSD, Lung nodule, HTN, RLS , on 08/14/16 admitted  for exploratory lap with lysis of adhesions, of adhesions, right colectomy. On 08/27/16  CT scan of the abdomen in the emergency department was consistent with breakdown of recent surgical anastomosis with the spillage of both air and apparent fecal matter as well as contrast of the peritoneum. He was taken urgently to the operating room for repair    PT Comments    Progressing with mobility. Pt was able to participate fairly well during session. Vitals WNL. Pain was rated 8/10 during session, 10/10 at end of session-RN made aware of pt's request for pain meds. Will continue to follow.   Follow Up Recommendations  SNF;Supervision/Assistance - 24 hour (May be able to d/c to home with [facility] if he progresses well. )     Equipment Recommendations   (continuing to assess)    Recommendations for Other Services       Precautions / Restrictions Precautions Precautions: Fall Precaution Comments: colostomy and JP drain on right Restrictions Weight Bearing Restrictions: No    Mobility  Bed Mobility Overal bed mobility: Needs Assistance Bed Mobility: Supine to Sit;Sit to Supine     Supine to sit: HOB elevated;Mod assist Sit to supine: HOB elevated;Mod assist   General bed mobility comments: assist with trunk, LEs. cues for abdomen protection.   Transfers Overall transfer level: Needs assistance Equipment used: Rolling walker (2 wheeled) Transfers: Sit to/from Stand Sit to Stand: Min assist;+2 safety/equipment         General transfer comment: Assist to rise, stabilize, control descent.   Ambulation/Gait Ambulation/Gait assistance: Min assist;+2  safety/equipment Ambulation Distance (Feet): 60 Feet Assistive device: Rolling walker (2 wheeled) Gait Pattern/deviations: Step-through pattern;Decreased stride length     General Gait Details: assist to stabilize and maneuver with RW. Dyspnea 2/4.    Stairs            Wheelchair Mobility    Modified Rankin (Stroke Patients Only)       Balance                                    Cognition Arousal/Alertness: Awake/alert Behavior During Therapy: WFL for tasks assessed/performed Overall Cognitive Status: Within Functional Limits for tasks assessed                      Exercises      General Comments        Pertinent Vitals/Pain Pain Assessment: 0-10 Pain Score: 8  Pain Location: abdomen with activity Pain Descriptors / Indicators: Discomfort;Grimacing;Guarding;Sharp Pain Intervention(s): Limited activity within patient's tolerance;Repositioned;Patient requesting pain meds-RN notified    Home Living                      Prior Function            PT Goals (current goals can now be found in the care plan section) Progress towards PT goals: Progressing toward goals    Frequency    Min 3X/week      PT Plan Current plan remains appropriate    Co-evaluation  End of Session   Activity Tolerance: Patient limited by pain Patient left: in bed;with call bell/phone within reach;with bed alarm set     Time: 1422-1435 PT Time Calculation (min) (ACUTE ONLY): 13 min  Charges:  $Gait Training: 8-22 mins                    G Codes:      Weston Anna, MPT Pager: [phone]

## 2016-08-31 NOTE — Consult Note (Addendum)
[facility] Nurse ostomy follow up Stoma type/location: RLQ ileostomy Stomal assessment/size: Slightly smaller than 1 and 1/2 inch; red, functioning, slightly edematous, moist.  Os at center Peristomal assessment: Intact, clear Treatment options for stomal/peristomal skin: Skin barrier ring.  Pouch tape is trimmed slightly at medial edge to allow for open wound Output: liquid dark brown/green effluent Ostomy pouching:2pc convex ostomy pouching system with skin barrier ring.  Education provided: None.  Patient is resting in ICU, asking for pain medication and it is not time yet (per bedside RN). Son (who will not be a care provider) leaves room when I change pouch. Teaching for patient and wife (and perhaps daughter) will increase when patient transfers to floor. Enrolled patient in [place] Start Discharge program: No WOC nursing team will follow, and will remain available to this patient, the nursing, surgical and medical teams.  Thanks, Maudie Flakes, MSN, RN, [place], Arther Abbott  Pager# [phone]

## 2016-08-31 NOTE — Progress Notes (Signed)
[facility] Office:  [phone] General Surgery Progress Note   LOS: 4 days  POD -  4 Days Post-Op  Assessment/Plan: 1.  EXPLORATORYLAPAROTOMY, LYSIS OF ADHESIONS, ILEOSTOMY, RIGHT COLECTOMY with end ileostomy and Hartmann's pouch (at left transverse colon) - 08/27/2016 - D. Biridiana Twardowski  For ischemic/infarcted right transverse colon  WBC - 8,300 - 08/28/2016  On Zosyn  Wound packed open - it is clean  On full liquids, but not taking much - will not advance diet until po's increase.  He has liquid stool in ileostomy.  2.  Open RESECTION TRANSVERSE COLON, Enterolysis of adhesions -  08/14/2016 - D. Dail Meece  For 1.2 cm adenocarcinoma of the right transverse colon, 0/7 nodes (T2, N0)  3. Gout involving left knee  Restart Uloric 4. ATRIAL FIBRILLATION, CONTROLLED (I48.91) Followed by Dr. Aundra Dubin for cardiology On Amiodarone Was on apixaban 5 mg BID - last dose 08/26/2016 - hold anti-coagulation for now 5. ASCENDING AORTIC ANEURYSM (I71.2) Impression: Followed by Dr. Servando Snare  6. History of Crohn's disesase Prior ileocecectomy around 46. He required a second operation. He had a fistula that took months to heal. He did not have straight forward surgery. He has has some prior SBO secondary to adhesions from that surgery 7. History of PE 8. History of Hep C - from blood transfusion during his bowel resection in the 1970's 9. Nephrolithiasis  10. Post tramatic stress syndrome - from service  On Paxil  11. Chronic renal insufficiency Creatinine - 1.36 - 08/31/2016  Seen by Dr. Lenna Sciara. Deterding q 3 months  12.  DVT prophylaxis - On hold because of recent Eliquis 13.  Nutrition - to start TPN 14.  Anemia, acute blood loss  Hgb - 7.3 -08/31/2016 (post transfusion of 1 unit of blood - he is to get a second unit)   Active Problems:   Bowel perforation (HCC)   Colonic ischemia  (HCC)   Pain of upper abdomen  Subjective:  Rough weekend, but actually does not look too bad.  Still with cough, but I think this is secondary to atelectasis and abdominal pain.  Wife in the room  Objective:   Vitals:   08/31/16 0600 08/31/16 0800  BP: 128/66 123/67  Pulse: 90 84  Resp: (!) 25 (!) 28  Temp:       Intake/Output from previous day:  01/28 0701 - 01/29 0700 In: 3041.6 [I.V.:2513.1; Blood:378.5; IV Piggyback:150] Out: 2940 [Urine:2400; Drains:25; JEHUD:149]  Intake/Output this shift:  Total I/O In: -  Out: 150 [Stool:150]   Physical Exam:   General: WN WM who is alert and oriented.    HEENT: Normal. Pupils equal. .   Lungs: Still some rhonchi.   Abdomen: Rare BS.  Ileostomy in RUQ - has output.   Wound: Clean - dressing changes BID   Lab Results:     Recent Labs  08/30/16 1014 08/30/16 2246 08/31/16 0448  WBC 12.1*  --  8.3  HGB 6.6* 7.2* 7.3*  HCT 19.7* 21.3* 21.7*  PLT 219  --  186    BMET    Recent Labs  08/30/16 0347 08/31/16 0448  NA 134* 131*  K 4.4 4.0  CL 104 100*  CO2 25 25  GLUCOSE 153* 145*  BUN 19 20  CREATININE 1.26* 1.36*  CALCIUM 7.7* 7.9*    PT/INR  No results for input(s): LABPROT, INR in the last 72 hours.  ABG  No results for input(s): PHART, HCO3 in the last 72 hours.  Invalid input(s):  PCO2, PO2   Studies/Results:  No results found.   Anti-infectives:   Anti-infectives    Start     Dose/Rate Route Frequency Ordered Stop   08/28/16 0045  piperacillin-tazobactam (ZOSYN) IVPB 3.375 g  Status:  Discontinued     3.375 g 12.5 mL/hr over 240 Minutes Intravenous Every 8 hours 08/28/16 0033 08/28/16 0038   08/28/16 0034  piperacillin-tazobactam (ZOSYN) IVPB 3.375 g     3.375 g 12.5 mL/hr over 240 Minutes Intravenous Every 8 hours 08/28/16 0034     08/28/16 0015  vancomycin (VANCOCIN) IVPB 750 mg/150 ml premix  Status:  Discontinued     750 mg 150 mL/hr over 60 Minutes Intravenous Every 12 hours 08/28/16 0001  08/29/16 1454   08/28/16 0000  fluconazole (DIFLUCAN) IVPB 400 mg  Status:  Discontinued     400 mg 100 mL/hr over 120 Minutes Intravenous Every 24 hours 08/28/16 0000 08/30/16 0950   08/27/16 1715  piperacillin-tazobactam (ZOSYN) IVPB 3.375 g     3.375 g 100 mL/hr over 30 Minutes Intravenous  Once 08/27/16 1709 08/27/16 1631      [name], MD, FACS Pager: [facility] Office: [phone] 08/31/2016

## 2016-08-31 NOTE — Progress Notes (Addendum)
PHARMACY - ADULT TOTAL PARENTERAL NUTRITION CONSULT NOTE   Pharmacy Consult for TPN Indication: Post-operative ileus   Patient Measurements: Ht 6'0", Wt 89.9 kg Height: 6' (182.9 cm) Weight: 189 lb 13.1 oz (86.1 kg) IBW/kg (Calculated) : 77.6  Recent Labs  08/29/16 0331 08/30/16 0347 08/31/16 0448  NA 136 134* 131*  K 4.3 4.4 4.0  CL 107 104 100*  CO2 23 25 25  GLUCOSE 170* 153* 145*  BUN 19 19 20  CREATININE 1.33* 1.26* 1.36*  CALCIUM 7.5* 7.7* 7.9*  PHOS 3.8 3.8 3.7  MG 1.6* 1.9 1.6*  ALBUMIN 1.9*  --  1.8*  ALKPHOS 89  --  143*  AST 17  --  49*  ALT 25  --  44  BILITOT 0.6  --  1.2  TRIG 85  --  94  PREALBUMIN 10.0*  --  5.8*   Insulin Requirements: 7 units of SSI yesterday  Current Nutrition: FLD starting 1/28  IVF: D5-NS at 40 mL/hr  Central access: 08/27/16: double-lumen CVC TPN start date: 08/28/16  ASSESSMENT                                                                                                          HPI: 75 y/o M s/p laparoscopic resection of transverse colon and enterolysis of adhesions on 08/14/16.  Subsequently was found to have free air under diaphragm and was taken to OR on 08/27/16 for exploratory laparotomy, which found ischemic/infarcted R transverse colon, extensive intra-abdominal adhesions.  Patient underwent R colectomy with end ileostomy and Hartmann's pouch at L transverse colon, and lysis of adhesions x 90 minutes.  Orders received POD#1 to begin TPN with pharmacy assistance.    PMH is extensive and is outlined in the physician H&P from 08/27/16.  Problems of note from a nutrition standpoint include anemia, Vit B12 deficiency (takes 1000 mcg IM every 14 days, reportedly last taken 1/11), CKD III (baseline SCr ~ 1.4 to 1.8) with secondary hyperparathyroidism, enteric hyperoxaluria, gout, atrial fibrillation (on Eliquis PTA) hepatitis C (spontaneously cleared), LV dysfunction (EF 45-50% in 2015, normalized on subsequent  echoes).  Significant events:  1/26  POD#1 - in ICU; requiring fluid boluses and phenylephrine drip for pressor support. Spoke with Dr. Newman - OK to reduce mIVF when TPN begins. 1/28 - PCCM s/o but remains ICU status for now 1/29 - Low grade fevers overnight; patient complaining of "fullness" and not wanting to eat much.  Today, 08/31/2016:   Glucose - CBGs mostly at goal (100-150); max 161; no h/o DM  Electrolytes - Na remains slightly low; Cl and Mg now low today (orders for Mag per MD); other lytes WNL including corrected calcium  Renal - Creatinine slightly up today but at baseline; h/o CKD III; baseline SCr ~ 1.4-1.8  Hepatic - Alk phos slightly elevated again; bili also up but remains WNL; Transaminases WNL.  TGs -  WNL  Prealbumin - dropped to 5.8 (1/29)  NUTRITIONAL GOALS                                                                                               RD recs 1/26: Kcal:  2150-2325 (25-28 kcal/kg); Protein: 103-121 grams (1.2-1.4 grams/kg) Clinimix E 5/20 at a goal rate of 83 ml/hr + 20% fat emulsion at 240ml/day to provide: 100 g/day protein, 2233 Kcal/day.  PLAN                                                                                                                          Will order an additional 2g Mag bolus for 3g total today (went from 1.6 to 1.9 with 2g bolus and similar CrCl on 1/27)   At 1800 tonight:  Reduce Clinmix-E 5/20 to 60 ml/hr. After discussion with dietitian, will wean slightly in an attempt to stimulate appetite, and hopefully not contribute to fluid overload causing sensations of fullness.  Please note that due to national shortage, the Clinimix formulas available to us are limited and can change on a daily basis.  Trace elements are also on national shortage.  As per current [place] standard, trace elements will be provided every other day  Due to ICU status, will withhold lipids for first week of TPN therapy as per [facility]  guidelines. Today will be day #4 of 7.   Multivitamin with minerals tablet once daily.  Continue CBG checks and will advance SSI to mod scale coverage q4h as CBGs rising with no noticeable lows  Repeat BMP, Mag, Phos tomorrow  TPN labs Mondays and Thursdays.  F/u tolerance to advancement of diet and potential weaning of TPN.   , PharmD, BCPS Pager: [phone] 08/31/2016, 8:32 AM       

## 2016-08-31 NOTE — Progress Notes (Signed)
[facility] Progress Note Patient Name: BRINDEN KINCHELOE DOB: 07-09-1941 MRN: [id number]   Date of Service  08/31/2016  HPI/Events of Note  Mg++ = 1.6 and Creatinine = 1.36.  eICU Interventions  Replace Mg++.     Intervention Category Major Interventions: Electrolyte abnormality - evaluation and management  Sommer,Steven Eugene 08/31/2016, 6:39 AM

## 2016-09-01 LAB — CBC WITH DIFFERENTIAL/PLATELET
BASOS ABS: 0 10*3/uL (ref 0.0–0.1)
BASOS PCT: 0 %
Eosinophils Absolute: 0.1 10*3/uL (ref 0.0–0.7)
Eosinophils Relative: 2 %
HEMATOCRIT: 24.1 % — AB (ref 39.0–52.0)
HEMOGLOBIN: 8.3 g/dL — AB (ref 13.0–17.0)
LYMPHS PCT: 6 %
Lymphs Abs: 0.4 10*3/uL — ABNORMAL LOW (ref 0.7–4.0)
MCH: 28.6 pg (ref 26.0–34.0)
MCHC: 34.4 g/dL (ref 30.0–36.0)
MCV: 83.1 fL (ref 78.0–100.0)
MONO ABS: 0.4 10*3/uL (ref 0.1–1.0)
Monocytes Relative: 5 %
NEUTROS ABS: 5.8 10*3/uL (ref 1.7–7.7)
NEUTROS PCT: 87 %
Platelets: 193 10*3/uL (ref 150–400)
RBC: 2.9 MIL/uL — AB (ref 4.22–5.81)
RDW: 13.9 % (ref 11.5–15.5)
WBC: 6.7 10*3/uL (ref 4.0–10.5)

## 2016-09-01 LAB — BASIC METABOLIC PANEL
ANION GAP: 8 (ref 5–15)
BUN: 21 mg/dL — ABNORMAL HIGH (ref 6–20)
CALCIUM: 8.1 mg/dL — AB (ref 8.9–10.3)
CO2: 26 mmol/L (ref 22–32)
Chloride: 98 mmol/L — ABNORMAL LOW (ref 101–111)
Creatinine, Ser: 1.42 mg/dL — ABNORMAL HIGH (ref 0.61–1.24)
GFR, EST AFRICAN AMERICAN: 55 mL/min — AB (ref >60)
GFR, EST NON AFRICAN AMERICAN: 47 mL/min — AB (ref >60)
Glucose, Bld: 158 mg/dL — ABNORMAL HIGH (ref 65–99)
POTASSIUM: 3.5 mmol/L (ref 3.5–5.1)
Sodium: 132 mmol/L — ABNORMAL LOW (ref 135–145)

## 2016-09-01 LAB — AEROBIC/ANAEROBIC CULTURE (SURGICAL/DEEP WOUND)

## 2016-09-01 LAB — TYPE AND SCREEN
Blood Product Expiration Date: 201802212359
ISSUE DATE / TIME: 201801291110
UNIT TYPE AND RH: 5100

## 2016-09-01 LAB — AEROBIC/ANAEROBIC CULTURE W GRAM STAIN (SURGICAL/DEEP WOUND)

## 2016-09-01 LAB — GLUCOSE, CAPILLARY
GLUCOSE-CAPILLARY: 164 mg/dL — AB (ref 65–99)
GLUCOSE-CAPILLARY: 154 mg/dL — AB (ref 65–99)
GLUCOSE-CAPILLARY: 143 mg/dL — AB (ref 65–99)
GLUCOSE-CAPILLARY: 163 mg/dL — AB (ref 65–99)
GLUCOSE-CAPILLARY: 169 mg/dL — AB (ref 65–99)
Glucose-Capillary: 147 mg/dL — ABNORMAL HIGH (ref 65–99)

## 2016-09-01 LAB — PHOSPHORUS: PHOSPHORUS: 4 mg/dL (ref 2.5–4.6)

## 2016-09-01 LAB — MAGNESIUM: Magnesium: 1.7 mg/dL (ref 1.7–2.4)

## 2016-09-01 MED ORDER — POTASSIUM CHLORIDE 10 MEQ/100ML IV SOLN
10.0000 meq | INTRAVENOUS | Status: AC
  Administered 2016-09-01 (×3): 10 meq via INTRAVENOUS
  Filled 2016-09-01 (×3): qty 100

## 2016-09-01 MED ORDER — SODIUM CHLORIDE 0.9 % IV SOLN: 30.0000 meq | Freq: Once | INTRAVENOUS | Status: DC

## 2016-09-01 MED ORDER — MAGNESIUM SULFATE 2 GM/50ML IV SOLN
2.0000 g | Freq: Once | INTRAVENOUS | Status: AC
  Administered 2016-09-01: 2 g via INTRAVENOUS
  Filled 2016-09-01: qty 50

## 2016-09-01 MED ORDER — FAT EMULSION 20 % IV EMUL
240.0000 mL | INTRAVENOUS | Status: AC
  Administered 2016-09-01: 240 mL via INTRAVENOUS
  Filled 2016-09-01: qty 250

## 2016-09-01 MED ORDER — CLINIMIX E/DEXTROSE (5/20) 5 % IV SOLN
INTRAVENOUS | Status: AC
  Administered 2016-09-01: 18:00:00 via INTRAVENOUS
  Filled 2016-09-01 (×2): qty 1440

## 2016-09-01 NOTE — Progress Notes (Signed)
Nutrition Follow-up  DOCUMENTATION CODES:   Not applicable  INTERVENTION:  - Continue Premier Protein BID. - Continue TPN per Pharmacy. - Diet advancement to Soft diet as medically feasible. - RD will follow-up 1/31.  NUTRITION DIAGNOSIS:   Inadequate oral intake related to inability to eat as evidenced by NPO status. -now on FLD with minimal PO intakes.   GOAL:   Patient will meet greater than or equal to 90% of their needs -met for protein, unmet for kcal.   MONITOR:   PO intake, Supplement acceptance, Diet advancement, Weight trends, Labs, I & O's, Other (Comment) (TPN regimen)  ASSESSMENT:   Pt with extensive history and s/p laparoscopic resection of transverse colon and enterolysis of adhesion for 2 hours on 08/14/16 by Dr. [name].  Post op he had some gout and got some steroids for this he was on a soft diet and ready for discharge on the 7th post op day.  He has developed a cough and was sent to the ED for evaluation after xray showed a significant amount of free air.  Pt still has abdominal pain.  He cannot lie down and sleeps in the chair at home.  He started coughing and called his PCP yesterday.  He denies SOB and sats are good on RA here in the ED lying down off O2.  He has a productive cough and is uncomfortable with that.  He also has pain sitting up or lying down.  He was sent after the CXR showed free air under the diaphragm.   1/30 No intakes documented since yesterday. Pt unsure if he had any dinner last night and drank ginger ale earlier this AM. RN reports pt consumed 100% of a carton of Premier Protein yesterday and he has been given one this AM which he has been sipping on. Pt states that he is not feeling well this AM and he feels this is the least he has taken PO since diet advancement. Continues with no new weight since admission.   Spoke with Pharmacist prior to rounds this AM and plan at this time to continue current TPN order of Clinimix E 5/20 @ 60 mL/hr.  Continue to hold ILE per ICU protocol (day #5/7). Current TPN regimen + IVF is providing 72 grams of protein, 1430 kcal. Each carton of Premier Protein provides 160 kcal and 30 grams of protein. Pt likely meeting estimated protein need but not kcal need.   Medications reviewed; sliding scale Novolog, 2 g IV Mg sulfate x1 dose yesterday and x2 doses today, daily multivitamin with minerals, PRN Zofran, 40 mg oral Protonix.day, 10 mEq IV KCl x3 runs today. Labs reviewed; CBG: 164 mg/dL this AM, Na: 132 mmol/L, Cl: 98 mmol/L, creatinine: 1.42 mg/dL, Ca: 8.1 mg/dL, GFR: 47 mL/min.   IVF: D5-NS @ 40 mL/hr (163 kcal).   1/29 - Diet advanced from NPO to CLD on 1/27 @ 88 and from CLD to [place] on 1/28 @ 0745 with no intakes documented.  - Son was at bedside but states he arrived a short time ago and is unsure if pt consumed anything PO this AM.  - He states that lunch tray of soup and jello have been ordered.  - During rounds RN reported that she saw breakfast tray with 25% completion.  - Pt very lethargic during visit and son reported pt was recently given Valium.  - Son states that from time of abdominal surgery earlier in the month until this admission pt was not eating well  and the only solid foods he had were a biscuit and pancake.  - No new weight since admission.  - Unable to perform physical assessment this visit d/t respect for pt's comfort but will attempt during follow-up tomorrow.  - Pt with double lumen CVC to R internal jugular.  - He is currently receiving Clinimix E 5/20 @ goal rate of 83 mL/hr; ILE being held per ICU protocol with today being day #4/7 of hold. Current TPN regimen + kcal from IVF is providing 1916 kcal (89% minimum estimated kcal need) and 100 grams of protein (97% minimum estimated protein need).  - During rounds RN reported hope for diet tolerance and to be able to d/c TPN tomorrow.  - Will order oral nutrition supplements to assist in this effort.  - Pt prefers to try  Premier Protein rather than Ensure.   IVF: D5-NS @ 40 mL/hr (163 kcal).    1/26 - Pt has been NPO since admission.  - Pt being helped by RN and tech at time of attempted first visit and pt sleeping at time of attempted second visit.  - Wife was on the phone outside of pt's room and requested that he be allowed to sleep as he has gotten very little rest.  - Wife left the floor shortly after this interaction.  - Unable to obtain PTA information at this time. - Pt is POD #1 ex lap with LOA, ileostomy, and R colectomy with end ileostomy and Hartmann's pouch. On 08/14/16 pt had open resection of transverse colon.  - Unable to perform physical assessment at this time and no visible muscle or fat wasting noted. - Physical assessment was performed by another RD on 08/19/16 during that hospitalization and no muscle or fat wasting were noted at that time.  - Per chart review, pt has lost 9 lbs (4.5% body weight) in the past 1 week which is significant for time frame.   IVF: D5-NS @ 125 mL/hr from 0000-1800 today and then to decrease to 95 mL/hr (388 kcal) once TPN starts tonight.    Diet Order:  Diet full liquid Room service appropriate? Yes; Fluid consistency: Thin .TPN (CLINIMIX-E) Adult TPN (CLINIMIX-E) Adult  Skin:  Wound (see comment) (Abdominal incision from surgery 1/25)  Last BM:  1/29  Height:   Ht Readings from Last 1 Encounters:  08/28/16 6' (1.829 m)    Weight:   Wt Readings from Last 1 Encounters:  08/28/16 189 lb 13.1 oz (86.1 kg)    Ideal Body Weight:  80.91 kg  BMI:  Body mass index is 25.74 kg/m.  Estimated Nutritional Needs:   Kcal:  2150-2325 (25-28 kcal/kg)  Protein:  103-121 grams (1.2-1.4 grams/kg)  Fluid:  >/= 2 L/day  EDUCATION NEEDS:   No education needs identified at this time     , MS, RD, LDN, CNSC Inpatient Clinical Dietitian Pager # [phone] After hours/weekend pager # [phone]  

## 2016-09-01 NOTE — Progress Notes (Signed)
[facility] Progress Note Patient Name: Derek [name] DOB: Mar 16, 1941 MRN: [id number]   Date of Service  09/01/2016  HPI/Events of Note  K+ = 3.5, Mg++ = 1.7 and Creatinine = 1.36.  eICU Interventions  Will replace Mg++ and K+.     Intervention Category Major Interventions: Electrolyte abnormality - evaluation and management  Annaly Skop Eugene 09/01/2016, 5:19 AM

## 2016-09-01 NOTE — Progress Notes (Signed)
PHARMACY - ADULT TOTAL PARENTERAL NUTRITION CONSULT NOTE   Pharmacy Consult for TPN Indication: Post-operative ileus   Patient Measurements: Ht 6'0", Wt 89.9 kg Height: 6' (182.9 cm) Weight: 189 lb 13.1 oz (86.1 kg) IBW/kg (Calculated) : 77.6  Recent Labs  08/31/16 0448 09/01/16 0408  NA 131* 132*  K 4.0 3.5  CL 100* 98*  CO2 25 26  GLUCOSE 145* 158*  BUN 20 21*  CREATININE 1.36* 1.42*  CALCIUM 7.9* 8.1*  PHOS 3.7 4.0  MG 1.6* 1.7  ALBUMIN 1.8*  --   ALKPHOS 143*  --   AST 49*  --   ALT 44  --   BILITOT 1.2  --   TRIG 94  --   PREALBUMIN 5.8*  --    Insulin Requirements: 13 units of SSI yesterday  Current Nutrition: FLD starting 1/28; appetite fair; took 1.5 premier protein supplements in past 24 hr (45g protein)  IVF: D5-NS at 40 mL/hr  Central access: 08/27/16: double-lumen CVC TPN start date: 08/28/16  ASSESSMENT                                                                                                          HPI: 76 y/o M s/p laparoscopic resection of transverse colon and enterolysis of adhesions on 08/14/16.  Subsequently was found to have free air under diaphragm and was taken to OR on 08/27/16 for exploratory laparotomy, which found ischemic/infarcted R transverse colon, extensive intra-abdominal adhesions.  Patient underwent R colectomy with end ileostomy and Hartmann's pouch at L transverse colon, and lysis of adhesions x 90 minutes.  Orders received POD#1 to begin TPN with pharmacy assistance.    PMH is extensive and is outlined in the physician H&P from 08/27/16.  Problems of note from a nutrition standpoint include anemia, Vit B12 deficiency (takes 1000 mcg IM every 14 days, reportedly last taken 1/11), CKD III (baseline SCr ~ 1.4 to 1.8) with secondary hyperparathyroidism, enteric hyperoxaluria, gout, atrial fibrillation (on Eliquis PTA) hepatitis C (spontaneously cleared), LV dysfunction (EF 45-50% in 2015, normalized on subsequent echoes).  Significant  events:  1/26  POD#1 - in ICU; requiring fluid boluses and phenylephrine drip for pressor support. Spoke with Dr. [name] - OK to reduce mIVF when TPN begins. 1/28 - PCCM s/o but remains ICU status for now 1/29 - Low grade fevers overnight; patient complaining of "fullness" and not wanting to eat much. Reduced TPN from 83 >> 60 ml/min in effort to stimulate appetite and reduce "fullness" from fluid overload 1/30 - intake with little/no improvement overnight; doesn't like full liquid diet options; pt asking surgery to advance to soft for better options  Today, 09/01/2016:   Glucose - Control worse after advancing TPN; CBGs about 50% at goal (100-150); max 164; no h/o DM  Electrolytes - Na remains slightly low; K now borderline low and Cl and Mg remain low today (orders for Mag and KCl per MD); other lytes WNL including corrected calcium  Renal - BUN/Creatinine slightly up today but at baseline; h/o CKD III; baseline SCr ~  1.4-1.8  Hepatic - Albumin low; Alk phos slightly elevated again; bili also up but remains WNL; Transaminases WNL.  TGs -  WNL  Prealbumin - dropped to 5.8 (1/29)  NUTRITIONAL GOALS                                                                                             RD recs 1/26: Kcal:  1747-1595 (25-28 kcal/kg); Protein: 103-121 grams (1.2-1.4 grams/kg) Clinimix E 5/20 at a goal rate of 83 ml/hr + 20% fat emulsion at 221m/day to provide: 100 g/day protein, 2233 Kcal/day.  PLAN                                                                                                                          Will order an additional 2g Mag bolus for 4g total today (went from 1.6 to 1.7 with 1+2g bolus and similar CrCl on 1/29)   At 1800 tonight:  Continue Clinmix-E 5/20 to 60 ml/hr. Will not reduce further until tolerating PO diet better or new orders from surgery.  Please note that due to national shortage, the Clinimix formulas [place] and can change  on a daily basis.  Trace elements are also on n[employer]ducer, television/film/video As per current [place] standard, trace elements will be provided every other day  IV lipid emulsion at 20 ml/hr x 12 hr. Patient still listed as ICU status but no longer on pressors and PCCM signed off. Will treat as non-critically ill and resume IV lipids tonight   Multivitamin with minerals tablet once daily.  Continue CBG checks and SSI mod scale coverage q4h  Repeat BMP, Mag tomorrow  TPN labs Mondays and Thursdays.  F/u tolerance to advancement of diet and potential weaning of TPN.  DReuel Boom PharmD, BCPS Pager: [phone]/30/2018, 9:32 AM

## 2016-09-01 NOTE — Progress Notes (Signed)
[facility]:  [phone] General Surgery Progress Note   LOS: 5 days  POD -  5 Days Post-Op  Assessment/Plan: 1.  EXPLORATORYLAPAROTOMY, LYSIS OF ADHESIONS, ILEOSTOMY, RIGHT COLECTOMY with end ileostomy and Hartmann's pouch (at left transverse colon) - 08/27/2016 - D. Jamelle Goldston  For ischemic/infarcted right transverse colon  WBC - 6,700 - 09/01/2016  On Zosyn  Wound packed open - it is clean  On full liquids, but not taking much - will not advance diet until po's increase.  He has liquid stool in ileostomy.  He does not look as good today and seems more distended.  Will leave in ICU.  2.  Open RESECTION TRANSVERSE COLON, Enterolysis of adhesions -  08/14/2016 - D. Haik Mahoney  For 1.2 cm adenocarcinoma of the right transverse colon, 0/7 nodes (T2, N0)  3. Gout involving left knee  Restart Uloric 4. ATRIAL FIBRILLATION, CONTROLLED (I48.91) Followed by Dr. Aundra Dubin for cardiology On Amiodarone Was on apixaban 5 mg BID - last dose 08/26/2016 - hold anti-coagulation for now 5. ASCENDING AORTIC ANEURYSM (I71.2) Impression: Followed by Dr. Servando Snare  6. History of Crohn's disesase Prior ileocecectomy around 55. He required a second operation. He had a fistula that took months to heal. He did not have straight forward surgery. He has has some prior SBO secondary to adhesions from that surgery 7. History of PE 8. History of Hep C - from blood transfusion during his bowel resection in the 1970's 9. Nephrolithiasis  10. Post tramatic stress syndrome - from service  On Paxil  11. Chronic renal insufficiency Creatinine - 1.42 - 09/01/2016  Seen by Dr. Lenna Sciara. Deterding q 3 months  12.  DVT prophylaxis - On hold because of recent Eliquis 13.  Nutrition -   TPN - on 60 cc/hour  Will keep on TPN until he is clearly taking po's well 14.  Anemia, acute blood loss  Hgb - 8.3  -09/01/2016 (post transfusion of 2 units of blood)   Active Problems:   Bowel perforation (HCC)   Colonic ischemia (HCC)   Pain of upper abdomen  Subjective:  Does not feel as good today and does not look as good.  No appetite.  On liquid diet, but does not feel like taking anything.  More abdominal pain.  Wife in the room  Objective:   Vitals:   09/01/16 0600 09/01/16 0700  BP: 120/64 120/64  Pulse: 81 81  Resp: (!) 27 (!) 31  Temp:       Intake/Output from previous day:  01/29 0701 - 01/30 0700 In: 3387.2 [I.V.:2702.2; Blood:335; IV Piggyback:350] Out: 4100 [Urine:3300; Drains:50; Stool:750]  Intake/Output this shift:  Total I/O In: 250 [I.V.:100; IV Piggyback:150] Out: -    Physical Exam:   General: WN WM who is alert and oriented.    HEENT: Normal. Pupils equal. .   Lungs: Sound junkier.   Abdomen: More distended today. Rare BS.  Ileostomy in RUQ - has output.   Wound: Clean - dressing changes BID   Lab Results:     Recent Labs  08/31/16 0448 08/31/16 1711 09/01/16 0408  WBC 8.3  --  6.7  HGB 7.3* 7.7* 8.3*  HCT 21.7* 22.8* 24.1*  PLT 186  --  193    BMET    Recent Labs  08/31/16 0448 09/01/16 0408  NA 131* 132*  K 4.0 3.5  CL 100* 98*  CO2 25 26  GLUCOSE 145* 158*  BUN 20 21*  CREATININE 1.36* 1.42*  CALCIUM  7.9* 8.1*    PT/INR  No results for input(s): LABPROT, INR in the last 72 hours.  ABG  No results for input(s): PHART, HCO3 in the last 72 hours.  Invalid input(s): PCO2, PO2   Studies/Results:  No results found.   Anti-infectives:   Anti-infectives    Start     Dose/Rate Route Frequency Ordered Stop   08/28/16 0045  piperacillin-tazobactam (ZOSYN) IVPB 3.375 g  Status:  Discontinued     3.375 g 12.5 mL/hr over 240 Minutes Intravenous Every 8 hours 08/28/16 0033 08/28/16 0038   08/28/16 0034  piperacillin-tazobactam (ZOSYN) IVPB 3.375 g     3.375 g 12.5 mL/hr over 240 Minutes Intravenous Every 8 hours 08/28/16 0034      08/28/16 0015  vancomycin (VANCOCIN) IVPB 750 mg/150 ml premix  Status:  Discontinued     750 mg 150 mL/hr over 60 Minutes Intravenous Every 12 hours 08/28/16 0001 08/29/16 1454   08/28/16 0000  fluconazole (DIFLUCAN) IVPB 400 mg  Status:  Discontinued     400 mg 100 mL/hr over 120 Minutes Intravenous Every 24 hours 08/28/16 0000 08/30/16 0950   08/27/16 1715  piperacillin-tazobactam (ZOSYN) IVPB 3.375 g     3.375 g 100 mL/hr over 30 Minutes Intravenous  Once 08/27/16 1709 08/27/16 1631      [name], MD, FACS Pager: [facility]: [phone] 09/01/2016

## 2016-09-02 LAB — BASIC METABOLIC PANEL
Anion gap: 8 (ref 5–15)
BUN: 23 mg/dL — AB (ref 6–20)
CO2: 28 mmol/L (ref 22–32)
CREATININE: 1.64 mg/dL — AB (ref 0.61–1.24)
Calcium: 8.2 mg/dL — ABNORMAL LOW (ref 8.9–10.3)
Chloride: 98 mmol/L — ABNORMAL LOW (ref 101–111)
GFR calc Af Amer: 46 mL/min — ABNORMAL LOW (ref >60)
GFR, EST NON AFRICAN AMERICAN: 40 mL/min — AB (ref >60)
GLUCOSE: 153 mg/dL — AB (ref 65–99)
POTASSIUM: 3.8 mmol/L (ref 3.5–5.1)
SODIUM: 134 mmol/L — AB (ref 135–145)

## 2016-09-02 LAB — CBC WITH DIFFERENTIAL/PLATELET
BASOS PCT: 0 %
Basophils Absolute: 0 10*3/uL (ref 0.0–0.1)
EOS PCT: 2 %
Eosinophils Absolute: 0.1 10*3/uL (ref 0.0–0.7)
HEMATOCRIT: 23.7 % — AB (ref 39.0–52.0)
Hemoglobin: 8 g/dL — ABNORMAL LOW (ref 13.0–17.0)
LYMPHS PCT: 5 %
Lymphs Abs: 0.3 10*3/uL — ABNORMAL LOW (ref 0.7–4.0)
MCH: 28.5 pg (ref 26.0–34.0)
MCHC: 33.8 g/dL (ref 30.0–36.0)
MCV: 84.3 fL (ref 78.0–100.0)
MONO ABS: 0.3 10*3/uL (ref 0.1–1.0)
MONOS PCT: 5 %
Neutro Abs: 5.1 10*3/uL (ref 1.7–7.7)
Neutrophils Relative %: 88 %
PLATELETS: 188 10*3/uL (ref 150–400)
RBC: 2.81 MIL/uL — ABNORMAL LOW (ref 4.22–5.81)
RDW: 14.1 % (ref 11.5–15.5)
WBC: 5.8 10*3/uL (ref 4.0–10.5)

## 2016-09-02 LAB — GLUCOSE, CAPILLARY
GLUCOSE-CAPILLARY: 138 mg/dL — AB (ref 65–99)
GLUCOSE-CAPILLARY: 146 mg/dL — AB (ref 65–99)
GLUCOSE-CAPILLARY: 125 mg/dL — AB (ref 65–99)
Glucose-Capillary: 137 mg/dL — ABNORMAL HIGH (ref 65–99)
Glucose-Capillary: 129 mg/dL — ABNORMAL HIGH (ref 65–99)

## 2016-09-02 LAB — MAGNESIUM: Magnesium: 2.2 mg/dL (ref 1.7–2.4)

## 2016-09-02 MED ORDER — FAT EMULSION 20 % IV EMUL
240.0000 mL | INTRAVENOUS | Status: AC
  Administered 2016-09-02: 240 mL via INTRAVENOUS
  Filled 2016-09-02: qty 240

## 2016-09-02 MED ORDER — PAROXETINE HCL 20 MG PO TABS
20.0000 mg | ORAL_TABLET | Freq: Every day | ORAL | Status: DC
  Administered 2016-09-02 – 2016-09-13 (×12): 20 mg via ORAL
  Filled 2016-09-02 (×12): qty 1

## 2016-09-02 MED ORDER — HEPARIN SODIUM (PORCINE) 5000 UNIT/ML IJ SOLN
5000.0000 [IU] | Freq: Three times a day (TID) | INTRAMUSCULAR | Status: DC
  Administered 2016-09-02 – 2016-09-14 (×36): 5000 [IU] via SUBCUTANEOUS
  Filled 2016-09-02 (×32): qty 1

## 2016-09-02 MED ORDER — POLYVINYL ALCOHOL-POVIDONE 1.4-0.6 % OP SOLN: 1.0000 [drp] | Freq: Two times a day (BID) | OPHTHALMIC | Status: DC

## 2016-09-02 MED ORDER — CALCIUM CARBONATE ANTACID 500 MG PO CHEW
2.0000 | CHEWABLE_TABLET | Freq: Two times a day (BID) | ORAL | Status: DC | PRN
Start: 2016-09-02 — End: 2016-09-14
  Administered 2016-09-10: 400 mg via ORAL
  Filled 2016-09-02: qty 2

## 2016-09-02 MED ORDER — INSULIN ASPART 100 UNIT/ML ~~LOC~~ SOLN
0.0000 [IU] | Freq: Three times a day (TID) | SUBCUTANEOUS | Status: DC
  Administered 2016-09-02 – 2016-09-03 (×4): 3 [IU] via SUBCUTANEOUS
  Administered 2016-09-04 (×2): 4 [IU] via SUBCUTANEOUS
  Administered 2016-09-04 – 2016-09-06 (×7): 3 [IU] via SUBCUTANEOUS
  Administered 2016-09-07: 4 [IU] via SUBCUTANEOUS
  Administered 2016-09-07 – 2016-09-08 (×3): 3 [IU] via SUBCUTANEOUS
  Administered 2016-09-08: 4 [IU] via SUBCUTANEOUS
  Administered 2016-09-09 – 2016-09-11 (×4): 3 [IU] via SUBCUTANEOUS

## 2016-09-02 MED ORDER — SODIUM CHLORIDE 0.9 % IV SOLN: 250.0000 mL | INTRAVENOUS | Status: DC | PRN

## 2016-09-02 MED ORDER — SODIUM CHLORIDE 0.9% FLUSH: 3.0000 mL | INTRAVENOUS | Status: DC | PRN

## 2016-09-02 MED ORDER — LACTATED RINGERS IV BOLUS (SEPSIS): 1000.0000 mL | Freq: Three times a day (TID) | INTRAVENOUS | Status: AC | PRN

## 2016-09-02 MED ORDER — CLINIMIX E/DEXTROSE (5/20) 5 % IV SOLN
INTRAVENOUS | Status: AC
  Administered 2016-09-02: 17:00:00 via INTRAVENOUS
  Filled 2016-09-02: qty 1440

## 2016-09-02 MED ORDER — TERAZOSIN HCL 2 MG PO CAPS
2.0000 mg | ORAL_CAPSULE | Freq: Every day | ORAL | Status: DC
  Administered 2016-09-02 – 2016-09-05 (×4): 2 mg via ORAL
  Filled 2016-09-02 (×6): qty 1

## 2016-09-02 MED ORDER — VANCOMYCIN HCL 10 G IV SOLR
1250.0000 mg | INTRAVENOUS | Status: DC
  Administered 2016-09-02 – 2016-09-05 (×4): 1250 mg via INTRAVENOUS
  Filled 2016-09-02 (×5): qty 1250

## 2016-09-02 MED ORDER — ALBUTEROL SULFATE (2.5 MG/3ML) 0.083% IN NEBU
2.5000 mg | INHALATION_SOLUTION | Freq: Four times a day (QID) | RESPIRATORY_TRACT | Status: DC | PRN
  Administered 2016-09-02 – 2016-09-04 (×2): 2.5 mg via RESPIRATORY_TRACT
  Filled 2016-09-02 (×2): qty 3

## 2016-09-02 MED ORDER — POLYVINYL ALCOHOL 1.4 % OP SOLN
1.0000 [drp] | Freq: Two times a day (BID) | OPHTHALMIC | Status: DC
  Administered 2016-09-02 – 2016-09-14 (×18): 1 [drp] via OPHTHALMIC
  Filled 2016-09-02 (×3): qty 15

## 2016-09-02 MED ORDER — TRAZODONE HCL 50 MG PO TABS
150.0000 mg | ORAL_TABLET | Freq: Every day | ORAL | Status: DC
  Administered 2016-09-02 – 2016-09-13 (×12): 150 mg via ORAL
  Filled 2016-09-02 (×12): qty 3

## 2016-09-02 MED ORDER — INSULIN ASPART 100 UNIT/ML ~~LOC~~ SOLN: 0.0000 [IU] | Freq: Every day | SUBCUTANEOUS | Status: DC

## 2016-09-02 MED ORDER — SODIUM CHLORIDE 0.9 % IV SOLN
INTRAVENOUS | Status: DC
  Administered 2016-09-02: 12:00:00 via INTRAVENOUS

## 2016-09-02 MED ORDER — GABAPENTIN 100 MG PO CAPS
100.0000 mg | ORAL_CAPSULE | Freq: Every day | ORAL | Status: DC
  Administered 2016-09-03 – 2016-09-14 (×12): 100 mg via ORAL
  Filled 2016-09-02 (×12): qty 1

## 2016-09-02 MED ORDER — GABAPENTIN 100 MG PO CAPS: 100.0000 mg | ORAL_CAPSULE | Freq: Two times a day (BID) | ORAL | Status: DC

## 2016-09-02 MED ORDER — SODIUM CHLORIDE 0.9% FLUSH
10.0000 mL | INTRAVENOUS | Status: DC | PRN
  Administered 2016-09-04 – 2016-09-12 (×6): 10 mL
  Filled 2016-09-02 (×6): qty 40

## 2016-09-02 MED ORDER — CYCLOSPORINE 0.05 % OP EMUL
1.0000 [drp] | Freq: Four times a day (QID) | OPHTHALMIC | Status: DC
  Administered 2016-09-02 – 2016-09-03 (×2): 1 [drp] via OPHTHALMIC
  Filled 2016-09-02 (×3): qty 1

## 2016-09-02 MED ORDER — ADULT MULTIVITAMIN W/MINERALS CH: 1.0000 | ORAL_TABLET | Freq: Every day | ORAL | Status: DC

## 2016-09-02 MED ORDER — GABAPENTIN 100 MG PO CAPS
200.0000 mg | ORAL_CAPSULE | Freq: Every day | ORAL | Status: DC
  Administered 2016-09-02 – 2016-09-13 (×12): 200 mg via ORAL
  Filled 2016-09-02 (×12): qty 2

## 2016-09-02 MED ORDER — SODIUM CHLORIDE 0.9% FLUSH
3.0000 mL | Freq: Two times a day (BID) | INTRAVENOUS | Status: DC
  Administered 2016-09-03 – 2016-09-13 (×16): 3 mL via INTRAVENOUS

## 2016-09-02 NOTE — Progress Notes (Signed)
PHARMACY - ADULT TOTAL PARENTERAL NUTRITION CONSULT NOTE   Pharmacy Consult for TPN Indication: Post-operative ileus   Patient Measurements: Ht 6'0", Wt 89.9 kg Height: 6' (182.9 cm) Weight: 189 lb 13.1 oz (86.1 kg) IBW/kg (Calculated) : 77.6  Recent Labs  08/31/16 0448 09/01/16 0408 09/02/16 0413  NA 131* 132* 134*  K 4.0 3.5 3.8  CL 100* 98* 98*  CO2 _0 GLUCOSE 145* 158* 153*  BUN 20 21* 23*  CREATININE 1.36* 1.42* 1.64*  CALCIUM 7.9* 8.1* 8.2*  PHOS 3.7 4.0  --   MG 1.6* 1.7 2.2  ALBUMIN 1.8*  --   --   ALKPHOS 143*  --   --   AST 49*  --   --   ALT 44  --   --   BILITOT 1.2  --   --   TRIG 94  --   --   PREALBUMIN 5.8*  --   --    Insulin Requirements: 17 units of SSI in the past 24hr  Current Nutrition: FLD starting 1/28; advancing to regular diet today in hopes of improving food choices  IVF: D5-NS at 40 mL/hr  Central access: 08/27/16: double-lumen CVC TPN start date: 08/28/16  ASSESSMENT                                                                                                          HPI: 75 y/o M s/p laparoscopic resection of transverse colon and enterolysis of adhesions on 08/14/16.  Subsequently was found to have free air under diaphragm and was taken to OR on 08/27/16 for exploratory laparotomy, which found ischemic/infarcted R transverse colon, extensive intra-abdominal adhesions.  Patient underwent R colectomy with end ileostomy and Hartmann's pouch at L transverse colon, and lysis of adhesions x 90 minutes.  Orders received POD#1 to begin TPN with pharmacy assistance.    PMH is extensive and is outlined in the physician H&P from 08/27/16.  Problems of note from a nutrition standpoint include anemia, Vit B12 deficiency (takes 1000 mcg IM every 14 days, reportedly last taken 1/11), CKD III (baseline SCr ~ 1.4 to 1.8) with secondary hyperparathyroidism, enteric hyperoxaluria, gout, atrial fibrillation (on Eliquis PTA) hepatitis C (spontaneously  cleared), LV dysfunction (EF 45-50% in 2015, normalized on subsequent echoes).  Significant events:  1/26  POD#1 - in ICU; requiring fluid boluses and phenylephrine drip for pressor support. Spoke with Dr. [name] - OK to reduce mIVF when TPN begins. 1/28 - PCCM s/o but remains ICU status for now 1/29 - Low grade fevers overnight; patient complaining of "fullness" and not wanting to eat much. Reduced TPN from 83 >> 60 ml/min in effort to stimulate appetite and reduce "fullness" from fluid overload 1/30 - intake with little/no improvement overnight; doesn't like full liquid diet options; pt asking surgery to advance to soft for better options  Today, 09/02/2016:   Glucose - most checks > goal of less than 150; no h/o DM  Electrolytes - Na/Cl remain slightly low; K wnl with replacement, Mg now wnl after replacement  Renal - BUN/Creatinine continue to rise; h/o CKD III; baseline SCr ~ 1.4-1.8  Hepatic - Albumin low; Alk phos slightly elevated again; bili also up but remains WNL; Transaminases WNL.  TGs -  WNL  Prealbumin - dropped to 5.8 (1/29)  Spoke with wife, she states patient ate one cup of ice cream, two peach slices and a few sips of ginger ale and Premier Protein yesterday. She states that any intake causes him to cough.  NUTRITIONAL GOALS                                                                                             RD recs 1/26: Kcal:  2150-2325 (25-28 kcal/kg); Protein: 103-121 grams (1.2-1.4 grams/kg) Clinimix E 5/20 at a goal rate of 83 ml/hr + 20% fat emulsion at 240ml/day to provide: 100 g/day protein, 2233 Kcal/day.  PLAN                                                                                                                         At 1800 tonight:  Continue Clinmix-E 5/20 at 60 ml/hr. Will not reduce further until tolerating PO diet better or new orders from surgery.  Please note that due to national shortage, the Clinimix formulas available to us  are limited and can change on a daily basis.  Trace elements are also on national shortage. Patient is ordered a daily multivitamin which contains an adequate amount of trace elements.  IV lipid emulsion at 20 ml/hr x 12 hr.   Multivitamin with minerals tablet once daily.  Change CBG checks and SSI to ACHS - change to resistant scale   Change IVF to NS at 40 ml/hr (remove dextrose to improve glucose control)  TPN labs Mondays and Thursdays.  F/u tolerance to advancement of diet and potential weaning of TPN.   , PharmD, BCPS Pager: [phone] 09/02/2016, 11:28 AM       

## 2016-09-02 NOTE — Progress Notes (Signed)
Pharmacy Antibiotic Note  Derek [name] is a 76 y.o. male with extensive hx and s/p laparoscopic resection of transverse colon and enterlysis of adhesion on 1/12 now with abdominal pain admitted on 08/27/2016 with intra-abdominal infection.  Pharmacy has been consulted to resume vancomycin dosing since enterococcus from intraabdominal wound culture is resistant to ampicillin and may not be fully treated with Zosyn. MD would like to continue treating with Zosyn at this time for broad coverage of other potential organisms considering site of infection.  Plan:  Vancomycin 1271m IV q24h  Check trough at steady state, goal 15-20 mcg/ml  Continue Zosyn 3.375gm IV q8h (4hr extended infusions) per MD  Temp (24hrs), Avg:98.3 F (36.8 C), Min:97.4 F (36.3 C), Max:98.8 F (37.1 C)   Recent Labs Lab 08/28/16 0106  08/29/16 0331 08/29/16 1404 08/30/16 0347 08/30/16 1014 08/31/16 0448 09/01/16 0408 09/02/16 0413  WBC  --   < > 14.4* 16.6*  --  12.1* 8.3 6.7 5.8  CREATININE  --   < > 1.33*  --  1.26*  --  1.36* 1.42* 1.64*  LATICACIDVEN 2.7*  --   --   --   --   --   --   --   --   < > = values in this interval not displayed.  Estimated Creatinine Clearance: 44 mL/min (by C-G formula based on SCr of 1.64 mg/dL (H)).    Allergies  Allergen Reactions  . Lorazepam Other (See Comments)    Reaction:  Hallucinations   . Humira [Adalimumab] Other (See Comments)    Pt states that he got pancreatitis.      Antimicrobials this admission: 1/25 zosyn(MD) >> 1/26 vancomycin>>1/27, resume 1/31 >> 1/26 fluconazole >> 1/28  Dose adjustments this admission:  Microbiology results: 1/25 wound cx: abundant enterococcus (R=amp, S=Vanc), few Haemophilus parainfluenzae, beta lactamase negative  Thank you for allowing pharmacy to be a part of this patient's care.  EPeggyann Juba PharmD, BCPS Pager: [phone]/31/2018 11:57 AM

## 2016-09-02 NOTE — Consult Note (Signed)
   [facility] CM Inpatient Consult   09/02/2016  [name] [birth year] [id number]    Patient screened for [facility] services. He just transferred from stepdown unit. Chart reviewed. Noted recent  recommendation is for SNF. Will engage for potential [facility] Care Management services if disposition is for home. Spoke with inpatient LCSW.   Marthenia Rolling, MSN-Ed, RN,BSN [facility] Liaison [phone]

## 2016-09-02 NOTE — Progress Notes (Addendum)
Nutrition Follow-up  DOCUMENTATION CODES:   Not applicable  INTERVENTION:  - Continue TPN/TPN wean per Pharmacy. - Continue to encourage PO intakes of meals and Premier Protein BID. - RD will follow-up 2/2.  NUTRITION DIAGNOSIS:   Inadequate oral intake related to inability to eat as evidenced by NPO status. -diet advanced with ongoing minimal intakes.   GOAL:   Patient will meet greater than or equal to 90% of their needs -met for protein, likely remains unmet for kcal on average.  MONITOR:   PO intake, Supplement acceptance, Weight trends, Labs, I & O's, Other (Comment) (TPN regimen)  ASSESSMENT:   Pt with extensive history and s/p laparoscopic resection of transverse colon and enterolysis of adhesion for 2 hours on 08/14/16 by Dr. [name].  Post op he had some gout and got some steroids for this he was on a soft diet and ready for discharge on the 7th post op day.  He has developed a cough and was sent to the ED for evaluation after xray showed a significant amount of free air.  Pt still has abdominal pain.  He cannot lie down and sleeps in the chair at home.  He started coughing and called his PCP yesterday.  He denies SOB and sats are good on RA here in the ED lying down off O2.  He has a productive cough and is uncomfortable with that.  He also has pain sitting up or lying down.  He was sent after the CXR showed free air under the diaphragm.   1/31 Diet advanced from FLD to Regular today at 0756 with no intakes documented at that time. Pt sleeping and wife on phone at time of first attempted visit and both pt and wife sleeping at time of second attempted visit. Spoke with RN who states that she did not see pt's lunch tray but that wife had reported pt ate more today than he has been since admission/diet advancement. Per chart review, pt received Premier Protein this AM but unsure of how much of this was consumed by pt. Continues with no new weight since admission.   Pharmacist's  note from this AM reviewed. Plan at this time to continue Clinimix E 5/20 @ 60 mL/hr until diet tolerance is confirmed. Plan to also add 20% ILE @ 20 mL/hr over 12 hours on M, W, F. This TPN regimen + kcal from IVF will provide a daily average of 1636 kcal, 72 grams of protein. Each container of Premier Protein provides 160 kcal and 30 grams of protein.   Medications reviewed; sliding scale Novolog, 2 g IV Mg sulfate x1 dose yesterday, daily multivitamin with minerals, PRN Zofran, 40 mg oral Protonix/day. Labs reviewed; CBGs: 138 and 146 mg/dL today, Na: 134 mmol/L, Cl: 98 mmol/L, BUN: 23 mg/dL, creatinine: 1.64 mg/dL, Ca: 8.2 mg/dL, GFR: 40 mL/min.  IVF: D5-NS @ 40 mL/hr (163 kcal).   1/30 - No intakes documented since yesterday.  - Pt unsure if he had any dinner last night and drank ginger ale earlier this AM.  - RN reports pt consumed 100% of a carton of Premier Protein yesterday and he has been given one this AM which he has been sipping on.  - He is not feeling well this AM and he feels this is the least he has taken PO since diet advancement.  - Physical assessment performed and shows no muscle and no fat wasting at this time.  - Spoke with Pharmacist prior to rounds this AM and plan  at this time to continue current TPN order of Clinimix E 5/20 @ 60 mL/hr. Continue to hold ILE per ICU protocol (day #5/7).  - Current TPN regimen + IVF is providing 72 grams of protein, 1430 kcal.   IVF: D5-NS @ 40 mL/hr (163 kcal).   1/29 - Diet advanced from NPO to CLD on 1/27 @ 27 and from CLD to [place] [facility] on 1/28 @ 0745 with no intakes documented.  - Son was at bedside but states he arrived a short time ago and is unsure if pt consumed anything PO this AM.  - He states that lunch tray of soup and jello have been ordered.  - During rounds RN reported that she saw breakfast tray with 25% completion.  - Pt very lethargic during visit and son reported pt was recently given Valium.  - Son states that from  time of abdominal surgery earlier in the month until this admission pt was not eating well and the only solid foods he had were a biscuit and pancake.  - No new weight since admission.  - Unable to perform physical assessment this visit d/t respect for pt's comfort but will attempt during follow-up tomorrow.  - Pt with double lumen CVC to R internal jugular.  - He is currently receiving Clinimix E 5/20 @ goal rate of 83 mL/hr; ILE being held per ICU protocol with today being day #4/7 of hold. Current TPN regimen + kcal from IVF is providing 1916 kcal (89% minimum estimated kcal need) and 100 grams of protein (97% minimum estimated protein need).  - During rounds RN reported hope for diet tolerance and to be able to d/c TPN tomorrow.  - Will order oral nutrition supplements to assist in this effort.  - Pt prefers to try Premier Protein rather than Ensure.   IVF: D5-NS @ 40 mL/hr (163 kcal).    Diet Order:  TPN (CLINIMIX-E) Adult Diet regular Room service appropriate? Yes; Fluid consistency: Thin TPN (CLINIMIX-E) Adult  Skin:  Wound (see comment) (Abdominal incision from surgery 1/25)  Last BM:  1/30  Height:   Ht Readings from Last 1 Encounters:  08/28/16 6' (1.829 m)    Weight:   Wt Readings from Last 1 Encounters:  08/28/16 189 lb 13.1 oz (86.1 kg)    Ideal Body Weight:  80.91 kg  BMI:  Body mass index is 25.74 kg/m.  Estimated Nutritional Needs:   Kcal:  5284-1324 (25-28 kcal/kg)  Protein:  103-121 grams (1.2-1.4 grams/kg)  Fluid:  >/= 2 L/day  EDUCATION NEEDS:   No education needs identified at this time    Jarome Matin, MS, RD, LDN, CNSC Inpatient Clinical Dietitian Pager # [phone] After hours/weekend pager # [phone]

## 2016-09-02 NOTE — Progress Notes (Signed)
Upon assessment pt. Was very SOB. Lung sounds bilaterally were clear. Pt. Requesting breathing treatment. On call surgeon was paged for a breathing treatment. One time dose ordered. Pt. Had no further c/o SOB after breathing treatment. Will continue to monitor pt. Closely

## 2016-09-02 NOTE — Progress Notes (Signed)
PT Cancellation Note  Patient Details Name: Derek [name] MRN: [id number] DOB: 09/01/1940   Cancelled Treatment:    Reason Eval/Treat Not Completed: Other (comment) (had been up already when PT attempted at 1045. will check back another time tomorrow.)   Claretha Cooper 09/02/2016, 4:58 PM Tresa Endo PT [phone]

## 2016-09-03 ENCOUNTER — Ambulatory Visit: Payer: Medicare Other | Admitting: Cardiology

## 2016-09-03 ENCOUNTER — Inpatient Hospital Stay (HOSPITAL_COMMUNITY): Payer: Medicare Other

## 2016-09-03 DIAGNOSIS — A439 Nocardiosis, unspecified: Secondary | ICD-10-CM

## 2016-09-03 HISTORY — DX: Nocardiosis, unspecified: A43.9

## 2016-09-03 LAB — CBC WITH DIFFERENTIAL/PLATELET
Basophils Absolute: 0 K/uL (ref 0.0–0.1)
Basophils Relative: 0 %
Eosinophils Absolute: 0.1 K/uL (ref 0.0–0.7)
Eosinophils Relative: 2 %
HCT: 25.1 % — ABNORMAL LOW (ref 39.0–52.0)
Hemoglobin: 8.4 g/dL — ABNORMAL LOW (ref 13.0–17.0)
Lymphocytes Relative: 10 %
Lymphs Abs: 0.6 K/uL — ABNORMAL LOW (ref 0.7–4.0)
MCH: 28.4 pg (ref 26.0–34.0)
MCHC: 33.5 g/dL (ref 30.0–36.0)
MCV: 84.8 fL (ref 78.0–100.0)
Monocytes Absolute: 0.4 K/uL (ref 0.1–1.0)
Monocytes Relative: 6 %
Neutro Abs: 5.4 K/uL (ref 1.7–7.7)
Neutrophils Relative %: 82 %
Platelets: 206 K/uL (ref 150–400)
RBC: 2.96 MIL/uL — ABNORMAL LOW (ref 4.22–5.81)
RDW: 13.9 % (ref 11.5–15.5)
WBC: 6.6 K/uL (ref 4.0–10.5)

## 2016-09-03 LAB — COMPREHENSIVE METABOLIC PANEL WITH GFR
ALT: 34 U/L (ref 17–63)
AST: 27 U/L (ref 15–41)
Albumin: 1.8 g/dL — ABNORMAL LOW (ref 3.5–5.0)
Alkaline Phosphatase: 267 U/L — ABNORMAL HIGH (ref 38–126)
Anion gap: 9 (ref 5–15)
BUN: 23 mg/dL — ABNORMAL HIGH (ref 6–20)
CO2: 29 mmol/L (ref 22–32)
Calcium: 8.3 mg/dL — ABNORMAL LOW (ref 8.9–10.3)
Chloride: 98 mmol/L — ABNORMAL LOW (ref 101–111)
Creatinine, Ser: 1.69 mg/dL — ABNORMAL HIGH (ref 0.61–1.24)
GFR calc Af Amer: 45 mL/min — ABNORMAL LOW
GFR calc non Af Amer: 38 mL/min — ABNORMAL LOW
Glucose, Bld: 137 mg/dL — ABNORMAL HIGH (ref 65–99)
Potassium: 3.8 mmol/L (ref 3.5–5.1)
Sodium: 136 mmol/L (ref 135–145)
Total Bilirubin: 0.6 mg/dL (ref 0.3–1.2)
Total Protein: 5.7 g/dL — ABNORMAL LOW (ref 6.5–8.1)

## 2016-09-03 LAB — GLUCOSE, CAPILLARY
GLUCOSE-CAPILLARY: 150 mg/dL — AB (ref 65–99)
GLUCOSE-CAPILLARY: 130 mg/dL — AB (ref 65–99)
Glucose-Capillary: 141 mg/dL — ABNORMAL HIGH (ref 65–99)
Glucose-Capillary: 118 mg/dL — ABNORMAL HIGH (ref 65–99)

## 2016-09-03 LAB — PHOSPHORUS: Phosphorus: 3.9 mg/dL (ref 2.5–4.6)

## 2016-09-03 LAB — MAGNESIUM: Magnesium: 1.8 mg/dL (ref 1.7–2.4)

## 2016-09-03 MED ORDER — IOPAMIDOL (ISOVUE-300) INJECTION 61%
15.0000 mL | Freq: Two times a day (BID) | INTRAVENOUS | Status: AC | PRN
  Administered 2016-09-03 (×2): 15 mL via ORAL
  Filled 2016-09-03 (×2): qty 30

## 2016-09-03 MED ORDER — FAT EMULSION 20 % IV EMUL
250.0000 mL | INTRAVENOUS | Status: AC
  Administered 2016-09-03: 250 mL via INTRAVENOUS
  Filled 2016-09-03: qty 250

## 2016-09-03 MED ORDER — CYCLOSPORINE 0.05 % OP EMUL
1.0000 [drp] | Freq: Two times a day (BID) | OPHTHALMIC | Status: DC
  Administered 2016-09-03 – 2016-09-14 (×23): 1 [drp] via OPHTHALMIC
  Filled 2016-09-03 (×24): qty 1

## 2016-09-03 MED ORDER — MAGNESIUM SULFATE IN D5W 1-5 GM/100ML-% IV SOLN
1.0000 g | Freq: Once | INTRAVENOUS | Status: AC
  Administered 2016-09-03: 1 g via INTRAVENOUS
  Filled 2016-09-03: qty 100

## 2016-09-03 MED ORDER — IOPAMIDOL (ISOVUE-300) INJECTION 61%
INTRAVENOUS | Status: AC
  Filled 2016-09-03: qty 30

## 2016-09-03 MED ORDER — CLINIMIX E/DEXTROSE (5/20) 5 % IV SOLN
INTRAVENOUS | Status: AC
  Administered 2016-09-03: 17:00:00 via INTRAVENOUS
  Filled 2016-09-03: qty 1440

## 2016-09-03 NOTE — Progress Notes (Signed)
Physical Therapy Treatment Patient Details Name: Derek [name] MRN: [id number] DOB: Jul 15, 1941 Today's Date: 09/03/2016    History of Present Illness HPI: Derek [name] is a 76 y.o. male with  history colon poylps, AAA,I, Crohn's disease, B12 deficiency, GERD, Gout, Hep C, PE, chronic thrombocytopenia,  PTSD, Lung nodule, HTN, RLS , on 08/14/16 admitted  for exploratory lap with lysis of adhesions, of adhesions, right colectomy. On 08/27/16  CT scan of the abdomen in the emergency department was consistent with breakdown of recent surgical anastomosis with the spillage of both air and apparent fecal matter as well as contrast of the peritoneum. He was taken urgently to the operating room for repair    PT Comments    The patient is progressing well. Walking with family./staff. Continue PT.  Follow Up Recommendations  Home health PT     Equipment Recommendations  None recommended by PT    Recommendations for Other Services       Precautions / Restrictions Precautions Precautions: Fall Precaution Comments: colostomy and JP drain on right    Mobility  Bed Mobility Overal bed mobility: Modified Independent                Transfers Overall transfer level: Needs assistance Equipment used: None Transfers: Sit to/from Stand Sit to Stand: Min guard         General transfer comment: cues for safety  Ambulation/Gait Ambulation/Gait assistance: Min assist Ambulation Distance (Feet): 440 Feet   Gait Pattern/deviations: Step-through pattern     General Gait Details: pushed IV pole, brisk pace, cues to slow down.   Stairs            Wheelchair Mobility    Modified Rankin (Stroke Patients Only)       Balance                                    Cognition Arousal/Alertness: Awake/alert                          Exercises      General Comments        Pertinent Vitals/Pain Pain Assessment: No/denies pain    Home  Living                      Prior Function            PT Goals (current goals can now be found in the care plan section) Progress towards PT goals: Progressing toward goals    Frequency    Min 3X/week      PT Plan Current plan remains appropriate;Discharge plan needs to be updated    Co-evaluation             End of Session   Activity Tolerance: Patient tolerated treatment well Patient left: in bed;with call bell/phone within reach;with family/visitor present     Time:  -     Charges:                       G Codes:      Claretha Cooper 09/03/2016, 5:14 PM

## 2016-09-03 NOTE — Consult Note (Signed)
[place] Nurse ostomy consult note Stoma type/location: RLQ ileostomy Stomal assessment/size: Slightly less than 1 and 1/2 inches round, red, moist.  Slightly higher than skin level from 3-7 o'clock, raised from 7-3 o'clock. Os at center. Peristomal assessment: Intact, clear. Treatment options for stomal/peristomal skin: Skin barrier ring.  Output: Thin, brown liquid. Stool flecks.  Wife is independent in emptying.  Ostomy pouching: 1pc convex pouch with skin barrier ring  Education provided: Wife is feeling better today about pouch change after watching again today.  Discussed keeping up with intake and output, options for thickening stool with dietary measures and to contact MD for guidance on medicinal ways to thicknen effluent (eg, antidiarrheals). HHRN to follow at home. Will see again tomorrow to answer questions. Enrolled patient in [place] program: No WOC nursing team will follow, and will remain available to this patient, the nursing, surgical and medical teams.   Thanks, Maudie Flakes, MSN, RN, [place], Arther Abbott  Pager# [phone]

## 2016-09-03 NOTE — Progress Notes (Signed)
PHARMACY - ADULT TOTAL PARENTERAL NUTRITION CONSULT NOTE   Pharmacy Consult for TPN Indication: Post-operative ileus   Patient Measurements: Ht 6'0", Wt 89.9 kg Height: 6' (182.9 cm) Weight: 189 lb 13.1 oz (86.1 kg) IBW/kg (Calculated) : 77.6  Recent Labs  09/01/16 0408 09/02/16 0413 09/03/16 0359  NA 132* 134* 136  K 3.5 3.8 3.8  CL 98* 98* 98*  CO2 26 28 29   GLUCOSE 158* 153* 137*  BUN 21* 23* 23*  CREATININE 1.42* 1.64* 1.69*  CALCIUM 8.1* 8.2* 8.3*  PHOS 4.0  --  3.9  MG 1.7 2.2 1.8  ALBUMIN  --   --  1.8*  ALKPHOS  --   --  267*  AST  --   --  27  ALT  --   --  34  BILITOT  --   --  0.6   Insulin Requirements: 8 units of SSI in the past 24hr  Current Nutrition: FLD starting 1/28; advancing to regular diet today in hopes of improving food choices  IVF: NS KVO  Central access: 08/27/16: double-lumen CVC TPN start date: 08/28/16  ASSESSMENT                                                                                                          HPI: 76 y/o M s/p laparoscopic resection of transverse colon and enterolysis of adhesions on 08/14/16.  Subsequently was found to have free air under diaphragm and was taken to OR on 08/27/16 for exploratory laparotomy, which found ischemic/infarcted R transverse colon, extensive intra-abdominal adhesions.  Patient underwent R colectomy with end ileostomy and Hartmann's pouch at L transverse colon, and lysis of adhesions x 90 minutes.  Orders received POD#1 to begin TPN with pharmacy assistance.    PMH is extensive and is outlined in the physician H&P from 08/27/16.  Problems of note from a nutrition standpoint include anemia, Vit B12 deficiency (takes 1000 mcg IM every 14 days, reportedly last taken 1/11), CKD III (baseline SCr ~ 1.4 to 1.8) with secondary hyperparathyroidism, enteric hyperoxaluria, gout, atrial fibrillation (on Eliquis PTA) hepatitis C (spontaneously cleared), LV dysfunction (EF 45-50% in 2015, normalized on  subsequent echoes).  Significant events:  1/26  POD#1 - in ICU; requiring fluid boluses and phenylephrine drip for pressor support. Spoke with Dr. [name] - OK to reduce mIVF when TPN begins. 1/28 - PCCM s/o but remains ICU status for now 1/29 - Low grade fevers overnight; patient complaining of "fullness" and not wanting to eat much. Reduced TPN from 83 >> 60 ml/min in effort to stimulate appetite and reduce "fullness" from fluid overload 1/30 - intake with little/no improvement overnight; doesn't like full liquid diet options; pt asking surgery to advance to soft for better options 2/1 - continue TPN until taking po's well  Today, 09/03/2016:   Glucose - most checks > goal of less than 150; no h/o DM  Electrolytes - Na/Cl low end of normal ; K wnl with replacement, Mg low end of normal  Renal - BUN/Creatinine continue to rise; h/o CKD III;  baseline SCr ~ 1.4-1.8  Hepatic - Albumin low; Alk phos slightly elevated again; bili also up but remains WNL; Transaminases WNL.  TGs -  WNL  Prealbumin - dropped to 5.8 (1/29)    NUTRITIONAL GOALS                                                                                             RD recs 1/26: Kcal:  4174-0814 (25-28 kcal/kg); Protein: 103-121 grams (1.2-1.4 grams/kg) Clinimix E 5/20 at a goal rate of 83 ml/hr + 20% fat emulsion at 221m/day to provide: 100 g/day protein, 2233 Kcal/day.  PLAN   Mag sulfate 1gm IV x 1                                                                                                                       At 1800 tonight:  Continue Clinmix-E 5/20 at 60 ml/hr. Will not reduce further until tolerating PO diet better or new orders from surgery.  Please note that due to national shortage, the Clinimix formulas available to [place] limited and can change on a daily basis.  Trace elements are also on nProducer, television/film/video Patient is ordered a daily multivitamin which contains an adequate amount of trace  elements.  IV lipid emulsion at 20 ml/hr x 12 hr.   Multivitamin with minerals tablet once daily.  continue resistant scale ACHS  [facility]  TPN labs Mondays and Thursdays.  BMet with mag and phos in am  F/u tolerance to advancement of diet and potential weaning of TPN.  EDolly RiasRPh 09/03/2016, 10:31 AM Pager [phone]

## 2016-09-03 NOTE — Progress Notes (Signed)
[facility]:  [phone] General Surgery Progress Note   LOS: 7 days  POD -  7 Days Post-Op  Assessment/Plan: 1.  EXPLORATORYLAPAROTOMY, LYSIS OF ADHESIONS, ILEOSTOMY, RIGHT COLECTOMY with end ileostomy and Hartmann's pouch (at left transverse colon) - 08/27/2016 - D. Ireoluwa Gorsline  For ischemic/infarcted right transverse colon  WBC - 6,600 - 09/03/2016  On Zosyn - 1/25 >>> and Vanc - 1/30 >>>  Wound packed open - it is clean  On regular food, but still not taking much.  Complains of bilateral shoulder pain when he first gets up.  Looks a little better each day.   One week post op - will get CT scan to look at anatomy and for fluid collections.  2.  Open RESECTION TRANSVERSE COLON, Enterolysis of adhesions -  08/14/2016 - D. Alaisha Eversley  For 1.2 cm adenocarcinoma of the right transverse colon, 0/7 nodes (T2, N0)  3. Gout involving left knee  Restart Uloric 4. ATRIAL FIBRILLATION, CONTROLLED (I48.91) Followed by Dr. Aundra Dubin for cardiology On Amiodarone Was on apixaban 5 mg BID - last dose 08/26/2016 - hold anti-coagulation for now 5. ASCENDING AORTIC ANEURYSM (I71.2) Impression: Followed by Dr. Servando Snare  6. History of Crohn's disesase Prior ileocecectomy around 33. He required a second operation. He had a fistula that took months to heal. He did not have straight forward surgery. He has has some prior SBO secondary to adhesions from that surgery 7. History of PE 8. History of Hep C - from blood transfusion during his bowel resection in the 1970's 9. Nephrolithiasis  10. Post tramatic stress syndrome - from service  On Paxil  11. Chronic renal insufficiency Creatinine - 1.69 - 09/03/2016  Seen by Dr. Lenna Sciara. Deterding q 3 months  12.  DVT prophylaxis - on SQ Heparin 13.  Nutrition -   TPN - on 60 cc/hour  Will keep on TPN until he is clearly taking po's well 14.   Anemia, acute blood loss  Hgb - 8.4 -09/03/2016    Active Problems:   Bowel perforation (HCC)   Colonic ischemia (HCC)   Pain of upper abdomen  Subjective:  Taking some more po's.  Complains of bilateral shoulder pain when he gets up.  Did not get out of bed much yesterday.  Wife in the room  Objective:   Vitals:   09/02/16 2115 09/03/16 0556  BP: 120/66 102/65  Pulse: 79 77  Resp: (!) 32 20  Temp: 98.7 F (37.1 C) 98.4 F (36.9 C)     Intake/Output from previous day:  01/31 0701 - 02/01 0700 In: 2666.3 [P.O.:360; I.V.:1906.3; IV Piggyback:400] Out: 2895 [Urine:1550; Drains:45; RDEYC:1448]  Intake/Output this shift:  No intake/output data recorded.   Physical Exam:   General: WN WM who is alert and oriented.    HEENT: Normal. Pupils equal. .   Lungs: Better breath sounds.   Abdomen:  Rare BS.  Ileostomy in RUQ - has output.   Wound: Clean - dressing changes BID   Lab Results:     Recent Labs  09/02/16 0413 09/03/16 0359  WBC 5.8 6.6  HGB 8.0* 8.4*  HCT 23.7* 25.1*  PLT 188 206    BMET    Recent Labs  09/02/16 0413 09/03/16 0359  NA 134* 136  K 3.8 3.8  CL 98* 98*  CO2 28 29  GLUCOSE 153* 137*  BUN 23* 23*  CREATININE 1.64* 1.69*  CALCIUM 8.2* 8.3*    PT/INR  No results for input(s): LABPROT, INR in  the last 72 hours.  ABG  No results for input(s): PHART, HCO3 in the last 72 hours.  Invalid input(s): PCO2, PO2   Studies/Results:  No results found.   Anti-infectives:   Anti-infectives    Start     Dose/Rate Route Frequency Ordered Stop   09/02/16 1300  vancomycin (VANCOCIN) 1,250 mg in sodium chloride 0.9 % 250 mL IVPB     1,250 mg 166.7 mL/hr over 90 Minutes Intravenous Every 24 hours 09/02/16 1155     08/28/16 0045  piperacillin-tazobactam (ZOSYN) IVPB 3.375 g  Status:  Discontinued     3.375 g 12.5 mL/hr over 240 Minutes Intravenous Every 8 hours 08/28/16 0033 08/28/16 0038   08/28/16 0034  piperacillin-tazobactam (ZOSYN) IVPB 3.375  g     3.375 g 12.5 mL/hr over 240 Minutes Intravenous Every 8 hours 08/28/16 0034     08/28/16 0015  vancomycin (VANCOCIN) IVPB 750 mg/150 ml premix  Status:  Discontinued     750 mg 150 mL/hr over 60 Minutes Intravenous Every 12 hours 08/28/16 0001 08/29/16 1454   08/28/16 0000  fluconazole (DIFLUCAN) IVPB 400 mg  Status:  Discontinued     400 mg 100 mL/hr over 120 Minutes Intravenous Every 24 hours 08/28/16 0000 08/30/16 0950   08/27/16 1715  piperacillin-tazobactam (ZOSYN) IVPB 3.375 g     3.375 g 100 mL/hr over 30 Minutes Intravenous  Once 08/27/16 1709 08/27/16 1631      [name], MD, FACS Pager: [facility]: [phone] 09/03/2016

## 2016-09-04 LAB — GLUCOSE, CAPILLARY
GLUCOSE-CAPILLARY: 120 mg/dL — AB (ref 65–99)
Glucose-Capillary: 149 mg/dL — ABNORMAL HIGH (ref 65–99)
Glucose-Capillary: 154 mg/dL — ABNORMAL HIGH (ref 65–99)
Glucose-Capillary: 132 mg/dL — ABNORMAL HIGH (ref 65–99)

## 2016-09-04 LAB — BASIC METABOLIC PANEL
Anion gap: 10 (ref 5–15)
BUN: 25 mg/dL — ABNORMAL HIGH (ref 6–20)
CALCIUM: 8.4 mg/dL — AB (ref 8.9–10.3)
CO2: 29 mmol/L (ref 22–32)
CREATININE: 1.84 mg/dL — AB (ref 0.61–1.24)
Chloride: 94 mmol/L — ABNORMAL LOW (ref 101–111)
GFR calc Af Amer: 40 mL/min — ABNORMAL LOW (ref >60)
GFR, EST NON AFRICAN AMERICAN: 35 mL/min — AB (ref >60)
GLUCOSE: 138 mg/dL — AB (ref 65–99)
Potassium: 4.1 mmol/L (ref 3.5–5.1)
Sodium: 133 mmol/L — ABNORMAL LOW (ref 135–145)

## 2016-09-04 LAB — MAGNESIUM: Magnesium: 2 mg/dL (ref 1.7–2.4)

## 2016-09-04 LAB — PHOSPHORUS: Phosphorus: 3.9 mg/dL (ref 2.5–4.6)

## 2016-09-04 MED ORDER — FAT EMULSION 20 % IV EMUL
250.0000 mL | INTRAVENOUS | Status: AC
  Administered 2016-09-04: 250 mL via INTRAVENOUS
  Filled 2016-09-04: qty 250

## 2016-09-04 MED ORDER — CLINIMIX E/DEXTROSE (5/20) 5 % IV SOLN
INTRAVENOUS | Status: AC
  Administered 2016-09-04: 17:00:00 via INTRAVENOUS
  Filled 2016-09-04: qty 960

## 2016-09-04 MED ORDER — DIAZEPAM 5 MG PO TABS
5.0000 mg | ORAL_TABLET | Freq: Three times a day (TID) | ORAL | Status: DC | PRN
  Administered 2016-09-04 – 2016-09-13 (×9): 5 mg via ORAL
  Filled 2016-09-04 (×9): qty 1

## 2016-09-04 NOTE — Consult Note (Signed)
   [facility] Inpatient Consult   09/04/2016  TORIN MODICA 1940-10-29 [id number]     [facility] Care Management follow up. Spoke with inpatient RNCM who indicates discharge plan is for home with home health. Went to bedside to speak with Mr. Lipton and wife. Mr. Aloi was resting. Explained [place] Management program services. Mrs. Lashomb pleasantly declines. She states, " I have some nursing experience myself and I think having home health will be enough". She accepted [facility] Care Management brochure to call in future if needed. Appreciative of visit. Sent notification to inpatient RNCM that patient's wife pleasantly declined [place] Management program.    Marthenia Rolling, [place], RN,BSN [facility] Liaison [phone]

## 2016-09-04 NOTE — Progress Notes (Signed)
Nutrition Follow-up  DOCUMENTATION CODES:   Not applicable  INTERVENTION:  - Will d/c Premier Protein per pt request. - Will order Magic Cup BID with meals, each supplement provides 290 kcal and 9 grams of protein - Continue to encourage PO intakes of meals and supplements. - D/c TPN as soon as is feasible. - RD will follow-up 2/5.  NUTRITION DIAGNOSIS:   Inadequate oral intake related to inability to eat as evidenced by NPO status -diet advanced and intakes improving.  GOAL:   Patient will meet greater than or equal to 90% of their needs -met on average  MONITOR:   PO intake, Supplement acceptance, Weight trends, Labs, I & O's, Other (Comment) (TPN regimen)  ASSESSMENT:   Pt with extensive history and s/p laparoscopic resection of transverse colon and enterolysis of adhesion for 2 hours on 08/14/16 by Dr. [name].  Post op he had some gout and got some steroids for this he was on a soft diet and ready for discharge on the 7th post op day.  He has developed a cough and was sent to the ED for evaluation after xray showed a significant amount of free air.  Pt still has abdominal pain.  He cannot lie down and sleeps in the chair at home.  He started coughing and called his PCP yesterday.  He denies SOB and sats are good on RA here in the ED lying down off O2.  He has a productive cough and is uncomfortable with that.  He also has pain sitting up or lying down.  He was sent after the CXR showed free air under the diaphragm.   2/2 Diet was changed from Regular to Heart Healthy but this AM was again liberalized to Regular. Per review, pt consumed 60% of dinner on 1/31 and no other intakes documented since that time. Pt was mainly asleep during visit and most of the information was provided by his wife.   She states that [place] RN encouraged ongoing focus on protein consumption. Pt was drinking Muscle Milk PTA and his wife has now been bringing these in as he prefers them over Premier Protein.  The variety pt drinks has 20 grams of protein. He drank a full one during RD visit. Talked with wife about protein-containing foods. Of note, pt does not like yogurt and has been advised to avoid nuts d/t new ostomy. Pt likes ice cream (not chocolate) and he and wife are interested in him receiving Magic Cup on all lunch and dinner trays. He has not been experiencing abdominal pain or any nausea with PO intakes over the past few days.   Per Pharmacy note, plan to decrease TPN today at 1800: Clinimix E 5/20 @ 40 mL/hr with 20% ILE @ 10 mL/hr over 12 hours on M, W, F. This regimen will provide a daily average of 948 kcal and 48 grams of protein.   Decreased estimated protein need slightly (from 1.2-1.4 grams/kg to 1-1.2 grams/kg) as pt is not as catabolic at this time. Will continue to monitor and will increase again if needed.  Medications reviewed; sliding scale Novolog, 1 g IV Mg sulfate x1 dose yesterday,daily multivitamin with minerals, PRN IV Zofran, 40 mg oral Protonix/day. Labs reviewed; CBG: 149 mg/dL, Na: 133 mmol/L, Cl: 94 mmol/L, BUN: 25 mg/dL, creatinine: 1.84 mg/dL, GFR: 35 mL/min.    1/31 - Diet advanced from FLD to Regular today at 3790 with no intakes documented at that time.  - Pt sleeping and wife on phone at  time of first attempted visit and both pt and wife sleeping at time of second attempted visit.  - Spoke with RN who states that she did not see pt's lunch tray but that wife had reported pt ate more today than he has been since admission/diet advancement.  - Per chart review, pt received Premier Protein this AM but unsure of how much of this was consumed by pt.  - Continues with no new weight since admission.   Pharmacist's note from this AM reviewed. Plan at this time to continue Clinimix E 5/20 @ 60 mL/hr until diet tolerance is confirmed. Plan to also add 20% ILE @ 20 mL/hr over 12 hours on M, W, F. This TPN regimen + kcal from IVF will provide a daily average of 1636 kcal,  72 grams of protein. Each container of Premier Protein provides 160 kcal and 30 grams of protein.   IVF: D5-NS @ 40 mL/hr (163 kcal).    1/30 - No intakes documented since yesterday.  - Pt unsure if he had any dinner last night and drank ginger ale earlier this AM.  - RN reports pt consumed 100% of a carton of Premier Protein yesterday and he has been given one this AM which he has been sipping on.  - He is not feeling well this AM and he feels this is the least he has taken PO since diet advancement.  - Physical assessment performed and shows no muscle and no fat wasting at this time.  - Spoke with Pharmacist prior to rounds this AM and plan at this time to continue current TPN order of Clinimix E 5/20 @ 60 mL/hr. Continue to hold ILE per ICU protocol (day #5/7).  - Current TPN regimen + IVF is providing 72 grams of protein, 1430 kcal.   IVF:D5-NS @ 40 mL/hr (163 kcal).   Diet Order:  TPN (CLINIMIX-E) Adult Diet regular Room service appropriate? Yes; Fluid consistency: Thin .TPN (CLINIMIX-E) Adult  Skin:  Wound (see comment) (Abdominal incision from surgery 1/25)  Last BM:  2/1  Height:   Ht Readings from Last 1 Encounters:  08/28/16 6' (1.829 m)    Weight:   Wt Readings from Last 1 Encounters:  08/28/16 189 lb 13.1 oz (86.1 kg)    Ideal Body Weight:  80.91 kg  BMI:  Body mass index is 25.74 kg/m.  Estimated Nutritional Needs:   Kcal:  4098-1191 (25-28 kcal/kg)  Protein:  86-103 grams (1-1.2 grams/kg)  Fluid:  >/= 2 L/day  EDUCATION NEEDS:   No education needs identified at this time    Jarome Matin, MS, RD, LDN, CNSC Inpatient Clinical Dietitian Pager # [phone] After hours/weekend pager # [phone]

## 2016-09-04 NOTE — Progress Notes (Signed)
Pt and spouse educated on changing of colostomy bag, troubleshooting and midline incision dressing change.  Wife watched yesterday and today was able to demonstrate and teach back both.  Incision with unattached edges, wound bed beefy red with small serosanguinous drainage noted.  Colostomy did leak some into wound.  Wound cleansed and redressed.  Pain better controlled today.  Will continue with plan of care.

## 2016-09-04 NOTE — Consult Note (Signed)
[place] Nurse ostomy follow up Patient ambulating in hall with wife. Enrolled patient in [place] Discharge program: Yes, today. [place] nursing team will follow while in house, and will remain available to this patient, the nursing, srugical and medical teams.   Thanks, Maudie Flakes, MSN, RN, [place], Arther Abbott  Pager# [phone]

## 2016-09-04 NOTE — Progress Notes (Signed)
PHARMACY - ADULT TOTAL PARENTERAL NUTRITION CONSULT NOTE   Pharmacy Consult for TPN Indication: Post-operative ileus   Patient Measurements: Ht 6'0", Wt 89.9 kg Height: 6' (182.9 cm) Weight: 189 lb 13.1 oz (86.1 kg) IBW/kg (Calculated) : 77.6  Recent Labs  09/03/16 0359 09/04/16 0514  NA 136 133*  K 3.8 4.1  CL 98* 94*  CO2 29 29  GLUCOSE 137* 138*  BUN 23* 25*  CREATININE 1.69* 1.84*  CALCIUM 8.3* 8.4*  PHOS 3.9 3.9  MG 1.8 2.0  ALBUMIN 1.8*  --   ALKPHOS 267*  --   AST 27  --   ALT 34  --   BILITOT 0.6  --    Insulin Requirements: 9 units of SSI in the past 24hr  Current Nutrition: advancing to regular diet 2/1 in hopes of improving food choices  IVF: NS KVO  Central access: 08/27/16: double-lumen CVC TPN start date: 08/28/16  ASSESSMENT                                                                                                          HPI: 76 y/o M s/p laparoscopic resection of transverse colon and enterolysis of adhesions on 08/14/16.  Subsequently was found to have free air under diaphragm and was taken to OR on 08/27/16 for exploratory laparotomy, which found ischemic/infarcted R transverse colon, extensive intra-abdominal adhesions.  Patient underwent R colectomy with end ileostomy and Hartmann's pouch at L transverse colon, and lysis of adhesions x 90 minutes.  Orders received POD#1 to begin TPN with pharmacy assistance.    PMH is extensive and is outlined in the physician H&P from 08/27/16.  Problems of note from a nutrition standpoint include anemia, Vit B12 deficiency (takes 1000 mcg IM every 14 days, reportedly last taken 1/11), CKD III (baseline SCr ~ 1.4 to 1.8) with secondary hyperparathyroidism, enteric hyperoxaluria, gout, atrial fibrillation (on Eliquis PTA) hepatitis C (spontaneously cleared), LV dysfunction (EF 45-50% in 2015, normalized on subsequent echoes).  Significant events:  1/26  POD#1 - in ICU; requiring fluid boluses and phenylephrine drip  for pressor support. Spoke with Dr. [name] - OK to reduce mIVF when TPN begins. 1/28 - PCCM s/o but remains ICU status for now 1/29 - Low grade fevers overnight; patient complaining of "fullness" and not wanting to eat much. Reduced TPN from 83 >> 60 ml/min in effort to stimulate appetite and reduce "fullness" from fluid overload 1/30 - intake with little/no improvement overnight; doesn't like full liquid diet options; pt asking surgery to advance to soft for better options 2/1 - continue TPN until taking po's well 2/2 - spoke with Dr. [name], ok to begin weaning TPN since patient is tolerating some regular diet  Today, 09/04/2016:   Glucose - CBGs improved to goal of less than 150; no h/o DM  Electrolytes - Na/Cl low; K now wnl with replacement, Mg now wnl after replacement 2/1  Renal - BUN/Creatinine continue to rise; h/o CKD III; baseline SCr ~ 1.4-1.8  Hepatic - Albumin low; Alk phos continues to rise, now higher  than admission (TPN related?); Tili back to WNL; Transaminases WNL.  TGs -  WNL, 85 (1/27), 94 (1/29)  Prealbumin - 10/(1/27), dropped to 5.8 (1/29)    NUTRITIONAL GOALS                                                                                             RD recs 1/26: Kcal:  9379-0240 (25-28 kcal/kg); Protein: 103-121 grams (1.2-1.4 grams/kg) Clinimix E 5/20 at a goal rate of 83 ml/hr + 20% fat emulsion at 267m/day to provide: 100 g/day protein, 2233 Kcal/day.  +++ Please note that due to national shortage, the Clinimix formulas available to [place] limited and can change on a daily basis +++  PLAN                                                                                                                       At 1800 tonight:  Decrease Clinmix-E 5/20 to 40 ml/hr.   Decrease IV lipid emulsion to 10 ml/hr x 12 hr.   Trace elements are also on national shortage. Patient is ordered a daily multivitamin which contains an adequate amount of trace  elements.  continue resistant scale ACHS  [facility]  TPN labs Mondays and Thursdays.  CMET in AM to monitor AlkPhos trend  F/u tolerance of regular diet and potential to d/c TPN tomorrow.  EPeggyann Juba PharmD, BCPS Pager: [phone]/09/2016, 10:14 AM

## 2016-09-04 NOTE — Progress Notes (Signed)
[facility]:  [phone] General Surgery Progress Note   LOS: 8 days  POD -  8 Days Post-Op  Assessment/Plan: 1.  EXPLORATORYLAPAROTOMY, LYSIS OF ADHESIONS, ILEOSTOMY, RIGHT COLECTOMY with end ileostomy and Hartmann's pouch (at left transverse colon) - 08/27/2016 - D. Jidenna Figgs  For ischemic/infarcted right transverse colon (pathology showed no residual cancer)  WBC - 6,600 - 09/03/2016  On Zosyn - 1/25 >>> and Vanc - 1/30 >>>  Wound packed open - it is starting to get good granulation tissue.  CT scan on 09/03/2016 showed no obvious fluid collection - normal post op changes  PO intake a little better  2.  Open RESECTION TRANSVERSE COLON, Enterolysis of adhesions -  08/14/2016 - D. Styles Fambro  For 1.2 cm adenocarcinoma of the right transverse colon, 0/7 nodes (T2, N0)  3. Gout involving left knee  Restart Uloric 4. ATRIAL FIBRILLATION, CONTROLLED (I48.91) Followed by Dr. Aundra Dubin for cardiology On Amiodarone Was on apixaban 5 mg BID - last dose 08/26/2016 - hold anti-coagulation for now 5. ASCENDING AORTIC ANEURYSM (I71.2) Impression: Followed by Dr. Servando Snare  6. History of Crohn's disesase Prior ileocecectomy around 14. He required a second operation. He had a fistula that took months to heal. He did not have straight forward surgery. He has has some prior SBO secondary to adhesions from that surgery 7. History of PE 8. History of Hep C - from blood transfusion during his bowel resection in the 1970's 9. Nephrolithiasis  10. Post tramatic stress syndrome - from service  On Paxil  11. Chronic renal insufficiency Creatinine - 1.84 - 09/04/2016  Seen by Dr. Lenna Sciara. Deterding q 3 months  12.  DVT prophylaxis - on SQ Heparin 13.  Nutrition -   TPN - on 60 cc/hour  Will keep on TPN until he is clearly taking po's well 14.  Anemia, acute blood loss  Hgb - 8.4  -09/03/2016    Active Problems:   Bowel perforation (HCC)   Colonic ischemia (HCC)   Pain of upper abdomen  Subjective:  Looking a little better everyday.  He did a good job of walking yesterday.    Wife in the room.  She's feeling a little more comfrotable with ostomy and wound.  Objective:   Vitals:   09/03/16 2121 09/04/16 0536  BP: (!) 110/58 (!) 103/56  Pulse: 79 69  Resp: 20 20  Temp: 99.1 F (37.3 C) 97.6 F (36.4 C)     Intake/Output from previous day:  02/01 0701 - 02/02 0700 In: 6144 [I.V.:1154; IV Piggyback:400] Out: 1020 [Urine:250; Drains:20; Stool:750]  Intake/Output this shift:  No intake/output data recorded.   Physical Exam:   General: WN WM who is alert and oriented.  HE just looks better.   HEENT: Normal. Pupils equal. .   Lungs: Better breath sounds.   Abdomen:  Has BS.  Ileostomy in RUQ - has output.   Wound: Clean.  He has some spillage of ileostomy into wound.  Nurse to change ostomy appliance, as necessary.   Lab Results:     Recent Labs  09/02/16 0413 09/03/16 0359  WBC 5.8 6.6  HGB 8.0* 8.4*  HCT 23.7* 25.1*  PLT 188 206    BMET    Recent Labs  09/03/16 0359 09/04/16 0514  NA 136 133*  K 3.8 4.1  CL 98* 94*  CO2 29 29  GLUCOSE 137* 138*  BUN 23* 25*  CREATININE 1.69* 1.84*  CALCIUM 8.3* 8.4*    PT/INR  No  results for input(s): LABPROT, INR in the last 72 hours.  ABG  No results for input(s): PHART, HCO3 in the last 72 hours.  Invalid input(s): PCO2, PO2   Studies/Results:  Ct Abdomen Pelvis Wo Contrast  Result Date: 09/03/2016 CLINICAL DATA:  Colonic ischemia status post right hemicolectomy. Evaluate for fluid collections. EXAM: CT ABDOMEN AND PELVIS WITHOUT CONTRAST TECHNIQUE: Multidetector CT imaging of the abdomen and pelvis was performed following the standard protocol without IV contrast. COMPARISON:  08/28/2015 FINDINGS: Lower chest: Subsegmental atelectasis at the bases. Cardiomegaly and extensive  atherosclerosis. Dilated ascending aorta measuring at least 47 mm on coronal reformats at the sino-tubular junction. Hepatobiliary: No focal liver abnormality.Cholecystectomy. Pancreas: Unremarkable. Spleen: Stable mild enlargement. Adrenals/Urinary Tract: Negative adrenals. Small bilateral renal calculi, at least 3 on the right measuring up to 3 mm. No hydronephrosis or ureteral calculus. 2 cm presumed right renal cyst. Negative bladder. Stomach/Bowel: Right hemicolectomy. Hartman's pouch at the distal transverse colon. There is oral contrast still within the nondilated remaining colon related to remote scan. Right lower quadrant ileostomy. Oral contrast reached the ileostomy bag. No obstruction or ileus. No suspected inflammatory wall thickening. There is generalized peritoneal thickening which is commonly seen postoperatively. Small ascites seen in the left pericolic gutter without suspected thick rim or overt loculation. Small volume pneumoperitoneum, expected. Surgical drain in place in the colectomy bed. Vascular/Lymphatic: No acute vascular abnormality. No mass or adenopathy. Reproductive:No acute finding. Other: Recent laparotomy incision. Superficially the incision is opened. No abdominal wall fluid collection. Musculoskeletal: No acute abnormalities. IMPRESSION: 1. Findings in keeping with recent right hemicolectomy. Small volume pneumoperitoneum and ascites without overt loculation. Peritoneal thickening and omental edema greatest in the right upper quadrant. 2. Oral contrast reaches the ileostomy bag. 3. Nephrolithiasis. 4. Aneurysmal ascending aorta measuring up to 47 mm. Recommend semi-annual imaging followup by CTA or MRA and referral to cardiothoracic surgery if not already obtained. This recommendation follows 2010 ACCF/AHA/AATS/ACR/ASA/SCA/SCAI/SIR/STS/SVM Guidelines for the Diagnosis and Management of Patients With Thoracic Aortic Disease. Circulation. 2010; 121: C127-N170 Electronically Signed    By: Monte Fantasia M.D.   On: 09/03/2016 12:41     Anti-infectives:   Anti-infectives    Start     Dose/Rate Route Frequency Ordered Stop   09/02/16 1300  vancomycin (VANCOCIN) 1,250 mg in sodium chloride 0.9 % 250 mL IVPB     1,250 mg 166.7 mL/hr over 90 Minutes Intravenous Every 24 hours 09/02/16 1155     08/28/16 0045  piperacillin-tazobactam (ZOSYN) IVPB 3.375 g  Status:  Discontinued     3.375 g 12.5 mL/hr over 240 Minutes Intravenous Every 8 hours 08/28/16 0033 08/28/16 0038   08/28/16 0034  piperacillin-tazobactam (ZOSYN) IVPB 3.375 g     3.375 g 12.5 mL/hr over 240 Minutes Intravenous Every 8 hours 08/28/16 0034     08/28/16 0015  vancomycin (VANCOCIN) IVPB 750 mg/150 ml premix  Status:  Discontinued     750 mg 150 mL/hr over 60 Minutes Intravenous Every 12 hours 08/28/16 0001 08/29/16 1454   08/28/16 0000  fluconazole (DIFLUCAN) IVPB 400 mg  Status:  Discontinued     400 mg 100 mL/hr over 120 Minutes Intravenous Every 24 hours 08/28/16 0000 08/30/16 0950   08/27/16 1715  piperacillin-tazobactam (ZOSYN) IVPB 3.375 g     3.375 g 100 mL/hr over 30 Minutes Intravenous  Once 08/27/16 1709 08/27/16 1631      [name], MD, FACS Pager: [facility] Office: [phone] 09/04/2016

## 2016-09-05 LAB — COMPREHENSIVE METABOLIC PANEL
ALK PHOS: 244 U/L — AB (ref 38–126)
ALT: 25 U/L (ref 17–63)
ANION GAP: 10 (ref 5–15)
AST: 25 U/L (ref 15–41)
Albumin: 2.1 g/dL — ABNORMAL LOW (ref 3.5–5.0)
BILIRUBIN TOTAL: 0.5 mg/dL (ref 0.3–1.2)
BUN: 32 mg/dL — ABNORMAL HIGH (ref 6–20)
CALCIUM: 8.5 mg/dL — AB (ref 8.9–10.3)
CO2: 28 mmol/L (ref 22–32)
Chloride: 95 mmol/L — ABNORMAL LOW (ref 101–111)
Creatinine, Ser: 2.04 mg/dL — ABNORMAL HIGH (ref 0.61–1.24)
GFR calc non Af Amer: 31 mL/min — ABNORMAL LOW (ref >60)
GFR, EST AFRICAN AMERICAN: 36 mL/min — AB (ref >60)
Glucose, Bld: 121 mg/dL — ABNORMAL HIGH (ref 65–99)
Potassium: 4.4 mmol/L (ref 3.5–5.1)
SODIUM: 133 mmol/L — AB (ref 135–145)
TOTAL PROTEIN: 5.9 g/dL — AB (ref 6.5–8.1)

## 2016-09-05 LAB — CBC WITH DIFFERENTIAL/PLATELET
Basophils Absolute: 0 10*3/uL (ref 0.0–0.1)
Basophils Relative: 0 %
EOS ABS: 0.3 10*3/uL (ref 0.0–0.7)
Eosinophils Relative: 4 %
HEMATOCRIT: 25.3 % — AB (ref 39.0–52.0)
HEMOGLOBIN: 8.4 g/dL — AB (ref 13.0–17.0)
LYMPHS ABS: 0.7 10*3/uL (ref 0.7–4.0)
Lymphocytes Relative: 11 %
MCH: 28.2 pg (ref 26.0–34.0)
MCHC: 33.2 g/dL (ref 30.0–36.0)
MCV: 84.9 fL (ref 78.0–100.0)
MONOS PCT: 7 %
Monocytes Absolute: 0.5 10*3/uL (ref 0.1–1.0)
NEUTROS PCT: 78 %
Neutro Abs: 5.2 10*3/uL (ref 1.7–7.7)
Platelets: 228 10*3/uL (ref 150–400)
RBC: 2.98 MIL/uL — ABNORMAL LOW (ref 4.22–5.81)
RDW: 14 % (ref 11.5–15.5)
WBC: 6.6 10*3/uL (ref 4.0–10.5)

## 2016-09-05 LAB — VANCOMYCIN, TROUGH: Vancomycin Tr: 14 ug/mL — ABNORMAL LOW (ref 15–20)

## 2016-09-05 LAB — GLUCOSE, CAPILLARY
Glucose-Capillary: 146 mg/dL — ABNORMAL HIGH (ref 65–99)
Glucose-Capillary: 140 mg/dL — ABNORMAL HIGH (ref 65–99)
Glucose-Capillary: 136 mg/dL — ABNORMAL HIGH (ref 65–99)
Glucose-Capillary: 137 mg/dL — ABNORMAL HIGH (ref 65–99)

## 2016-09-05 MED ORDER — FAT EMULSION 20 % IV EMUL
250.0000 mL | INTRAVENOUS | Status: AC
  Administered 2016-09-05: 250 mL via INTRAVENOUS
  Filled 2016-09-05: qty 250

## 2016-09-05 MED ORDER — CLINIMIX E/DEXTROSE (5/20) 5 % IV SOLN
INTRAVENOUS | Status: AC
  Administered 2016-09-05: 17:00:00 via INTRAVENOUS
  Filled 2016-09-05: qty 960

## 2016-09-05 NOTE — Progress Notes (Signed)
[facility]:  [phone] General Surgery Progress Note   LOS: 9 days  POD -  9 Days Post-Op  Assessment/Plan: 1.  EXPLORATORYLAPAROTOMY, LYSIS OF ADHESIONS, ILEOSTOMY, RIGHT COLECTOMY with end ileostomy and Hartmann's pouch (at left transverse colon) - 08/27/2016 - D. Mihir Flanigan  For ischemic/infarcted right transverse colon  WBC - 6,600 - 09/05/2016  On Zosyn - 1/25 >>> and Vanc - 1/30 >>>  Wound packed open - it is clean  On regular food.  RUQ drain removed today.  2.  Open RESECTION TRANSVERSE COLON, Enterolysis of adhesions -  08/14/2016 - D. Haydyn Girvan  For 1.2 cm adenocarcinoma of the right transverse colon, 0/7 nodes (T2, N0)  3. Gout involving left knee  Restart Uloric 4. ATRIAL FIBRILLATION, CONTROLLED (I48.91) Followed by Dr. Aundra Dubin for cardiology On Amiodarone Was on apixaban 5 mg BID - last dose 08/26/2016 - hold anti-coagulation for now 5. ASCENDING AORTIC ANEURYSM (I71.2) Impression: Followed by Dr. Servando Snare  6. History of Crohn's disesase Prior ileocecectomy around 43. He required a second operation. He had a fistula that took months to heal. He did not have straight forward surgery. He has has some prior SBO secondary to adhesions from that surgery 7. History of PE 8. History of Hep C - from blood transfusion during his bowel resection in the 1970's 9. Nephrolithiasis  10. Post tramatic stress syndrome - from service  On Paxil  11. Chronic renal insufficiency Creatinine - 2.04 - 09/05/2016  Seen by Dr. Lenna Sciara. Deterding q 3 months  12.  DVT prophylaxis - on SQ Heparin 13.  Nutrition -   TPN - on 60 cc/hour  Will keep on TPN until he is clearly taking po's well 14.  Anemia, acute blood loss  Hgb - 8.4 -09/03/2016    Active Problems:   Bowel perforation (HCC)   Colonic ischemia (HCC)   Pain of upper abdomen  Subjective:  Doing okay with  po diet.  Somewhat SOB last PM, but breathing treatment has helped and doing better today.  Wife in the room  Objective:   Vitals:   09/04/16 2043 09/05/16 0610  BP: (!) 112/59 (!) 101/54  Pulse: 78 78  Resp: (!) 24 (!) 22  Temp: 97.5 F (36.4 C) 98.1 F (36.7 C)     Intake/Output from previous day:  02/02 0701 - 02/03 0700 In: 1343.5 [I.V.:1043.5; IV Piggyback:300] Out: 1324 [Urine:100; Drains:25; MWNUU:7253]  Intake/Output this shift:  No intake/output data recorded.   Physical Exam:   General: WN WM who is alert and oriented.    HEENT: Normal. Pupils equal. .   Lungs: Better breath sounds.   Abdomen:  Ileostomy in RUQ - has output.   RUQ drain - only 25 cc recorded last 24 hours - will remove   Wound: Clean - dressing changes BID   Lab Results:     Recent Labs  09/03/16 0359 09/05/16 0336  WBC 6.6 6.6  HGB 8.4* 8.4*  HCT 25.1* 25.3*  PLT 206 228    BMET    Recent Labs  09/04/16 0514 09/05/16 0336  NA 133* 133*  K 4.1 4.4  CL 94* 95*  CO2 29 28  GLUCOSE 138* 121*  BUN 25* 32*  CREATININE 1.84* 2.04*  CALCIUM 8.4* 8.5*    PT/INR  No results for input(s): LABPROT, INR in the last 72 hours.  ABG  No results for input(s): PHART, HCO3 in the last 72 hours.  Invalid input(s): PCO2, PO2   Studies/Results:  Ct Abdomen Pelvis Wo Contrast  Result Date: 09/03/2016 CLINICAL DATA:  Colonic ischemia status post right hemicolectomy. Evaluate for fluid collections. EXAM: CT ABDOMEN AND PELVIS WITHOUT CONTRAST TECHNIQUE: Multidetector CT imaging of the abdomen and pelvis was performed following the standard protocol without IV contrast. COMPARISON:  08/28/2015 FINDINGS: Lower chest: Subsegmental atelectasis at the bases. Cardiomegaly and extensive atherosclerosis. Dilated ascending aorta measuring at least 47 mm on coronal reformats at the sino-tubular junction. Hepatobiliary: No focal liver abnormality.Cholecystectomy. Pancreas: Unremarkable. Spleen: Stable mild  enlargement. Adrenals/Urinary Tract: Negative adrenals. Small bilateral renal calculi, at least 3 on the right measuring up to 3 mm. No hydronephrosis or ureteral calculus. 2 cm presumed right renal cyst. Negative bladder. Stomach/Bowel: Right hemicolectomy. Hartman's pouch at the distal transverse colon. There is oral contrast still within the nondilated remaining colon related to remote scan. Right lower quadrant ileostomy. Oral contrast reached the ileostomy bag. No obstruction or ileus. No suspected inflammatory wall thickening. There is generalized peritoneal thickening which is commonly seen postoperatively. Small ascites seen in the left pericolic gutter without suspected thick rim or overt loculation. Small volume pneumoperitoneum, expected. Surgical drain in place in the colectomy bed. Vascular/Lymphatic: No acute vascular abnormality. No mass or adenopathy. Reproductive:No acute finding. Other: Recent laparotomy incision. Superficially the incision is opened. No abdominal wall fluid collection. Musculoskeletal: No acute abnormalities. IMPRESSION: 1. Findings in keeping with recent right hemicolectomy. Small volume pneumoperitoneum and ascites without overt loculation. Peritoneal thickening and omental edema greatest in the right upper quadrant. 2. Oral contrast reaches the ileostomy bag. 3. Nephrolithiasis. 4. Aneurysmal ascending aorta measuring up to 47 mm. Recommend semi-annual imaging followup by CTA or MRA and referral to cardiothoracic surgery if not already obtained. This recommendation follows 2010 ACCF/AHA/AATS/ACR/ASA/SCA/SCAI/SIR/STS/SVM Guidelines for the Diagnosis and Management of Patients With Thoracic Aortic Disease. Circulation. 2010; 121: G017-C944 Electronically Signed   By: Monte Fantasia M.D.   On: 09/03/2016 12:41     Anti-infectives:   Anti-infectives    Start     Dose/Rate Route Frequency Ordered Stop   09/02/16 1300  vancomycin (VANCOCIN) 1,250 mg in sodium chloride 0.9 %  250 mL IVPB     1,250 mg 166.7 mL/hr over 90 Minutes Intravenous Every 24 hours 09/02/16 1155     08/28/16 0045  piperacillin-tazobactam (ZOSYN) IVPB 3.375 g  Status:  Discontinued     3.375 g 12.5 mL/hr over 240 Minutes Intravenous Every 8 hours 08/28/16 0033 08/28/16 0038   08/28/16 0034  piperacillin-tazobactam (ZOSYN) IVPB 3.375 g     3.375 g 12.5 mL/hr over 240 Minutes Intravenous Every 8 hours 08/28/16 0034     08/28/16 0015  vancomycin (VANCOCIN) IVPB 750 mg/150 ml premix  Status:  Discontinued     750 mg 150 mL/hr over 60 Minutes Intravenous Every 12 hours 08/28/16 0001 08/29/16 1454   08/28/16 0000  fluconazole (DIFLUCAN) IVPB 400 mg  Status:  Discontinued     400 mg 100 mL/hr over 120 Minutes Intravenous Every 24 hours 08/28/16 0000 08/30/16 0950   08/27/16 1715  piperacillin-tazobactam (ZOSYN) IVPB 3.375 g     3.375 g 100 mL/hr over 30 Minutes Intravenous  Once 08/27/16 1709 08/27/16 1631      [name], MD, FACS Pager: [facility] Office: [phone] 09/05/2016

## 2016-09-05 NOTE — Progress Notes (Signed)
PHARMACY - ADULT TOTAL PARENTERAL NUTRITION CONSULT NOTE   Pharmacy Consult for TPN Indication: Post-operative ileus   Patient Measurements: Ht 6'0", Wt 89.9 kg Height: 6' (182.9 cm) Weight: 189 lb 13.1 oz (86.1 kg) IBW/kg (Calculated) : 77.6  Recent Labs  09/03/16 0359 09/04/16 0514 09/05/16 0336  NA 136 133* 133*  K 3.8 4.1 4.4  CL 98* 94* 95*  CO2 29 29 28   GLUCOSE 137* 138* 121*  BUN 23* 25* 32*  CREATININE 1.69* 1.84* 2.04*  CALCIUM 8.3* 8.4* 8.5*  PHOS 3.9 3.9  --   MG 1.8 2.0  --   ALBUMIN 1.8*  --  2.1*  ALKPHOS 267*  --  244*  AST 27  --  25  ALT 34  --  25  BILITOT 0.6  --  0.5   Insulin Requirements: 14 units of SSI in the past 24hr  Current Nutrition: advanced to regular diet 2/1 in hopes of improving food choices  IVF: NS KVO  Central access: 08/27/16: double-lumen CVC TPN start date: 08/28/16  ASSESSMENT                                                                                                          HPI: 76 y/o M s/p laparoscopic resection of transverse colon and enterolysis of adhesions on 08/14/16.  Subsequently was found to have free air under diaphragm and was taken to OR on 08/27/16 for exploratory laparotomy, which found ischemic/infarcted R transverse colon, extensive intra-abdominal adhesions.  Patient underwent R colectomy with end ileostomy and Hartmann's pouch at L transverse colon, and lysis of adhesions x 90 minutes.  Orders received POD#1 to begin TPN with pharmacy assistance.    PMH is extensive and is outlined in the physician H&P from 08/27/16.  Problems of note from a nutrition standpoint include anemia, Vit B12 deficiency (takes 1000 mcg IM every 14 days, reportedly last taken 1/11), CKD III (baseline SCr ~ 1.4 to 1.8) with secondary hyperparathyroidism, enteric hyperoxaluria, gout, atrial fibrillation (on Eliquis PTA) hepatitis C (spontaneously cleared), LV dysfunction (EF 45-50% in 2015, normalized on subsequent echoes).  Significant  events:  1/26  POD#1 - in ICU; requiring fluid boluses and phenylephrine drip for pressor support. Spoke with Dr. [name] - OK to reduce mIVF when TPN begins. 1/28 - PCCM s/o but remains ICU status for now 1/29 - Low grade fevers overnight; patient complaining of "fullness" and not wanting to eat much. Reduced TPN from 83 >> 60 ml/min in effort to stimulate appetite and reduce "fullness" from fluid overload 1/30 - intake with little/no improvement overnight; doesn't like full liquid diet options; pt asking surgery to advance to soft for better options 2/1 - continue TPN until taking po's well 2/2 - spoke with Dr. [name], ok to begin weaning TPN since patient is tolerating some regular diet  Today, 09/05/2016:   Glucose - CBGs improved to goal of less than 150; no h/o DM  Electrolytes - Na/Cl low; K now wnl with replacement, Mg now wnl after replacement 2/1  Renal - BUN/Creatinine continue  to rise; h/o CKD III; baseline SCr ~ 1.4-1.8  Hepatic - Albumin low; Alk phos continues to rise, now higher than admission (TPN related?); Tili back to WNL; Transaminases WNL.  TGs -  WNL, 85 (1/27), 94 (1/29)  Prealbumin - 10/(1/27), dropped to 5.8 (1/29)    NUTRITIONAL GOALS                                                                                             RD recs 1/26: Kcal:  2482-5003 (25-28 kcal/kg); Protein: 103-121 grams (1.2-1.4 grams/kg) Clinimix E 5/20 at a goal rate of 83 ml/hr + 20% fat emulsion at 254m/day to provide: 100 g/day protein, 2233 Kcal/day.  +++ Please note that due to national shortage, the Clinimix formulas available to [place] and can change on a daily basis +++  PLAN                                                                                                                       At 1800 tonight:  Continue Clinmix-E 5/20 at 40 ml/hr.   Continue IV lipid emulsion at 10 ml/hr x 12 hr.   Trace elements are also on national shortage. Patient is ordered a  daily multivitamin which contains an adequate amount of trace elements.  continue resistant scale ACHS  [facility]  TPN labs Mondays and Thursdays.  CMET in AM to monitor AlkPhos trend  F/u tolerance of regular diet and potential to d/c TPN tomorrow (await orders from [facility]).  EPeggyann Juba PharmD, BCPS Pager: [phone]/10/2016, 9:54 AM

## 2016-09-05 NOTE — Progress Notes (Signed)
Pharmacy Antibiotic Note  Derek [name] is a 76 y.o. male with extensive hx and s/p laparoscopic resection of transverse colon and enterlysis of adhesion on 1/12 now with abdominal pain admitted on 08/27/2016 with intra-abdominal infection.  Pharmacy has been consulted to resume vancomycin dosing since enterococcus from intraabdominal wound culture is resistant to ampicillin and may not be fully treated with Zosyn. MD would like to continue treating with Zosyn at this time for broad coverage of other potential organisms considering site of infection.  Today, 09/05/2016 Day #10 total antibiotics Vancomycin trough = 14 mcg/ml prior to 4th dose SCr rising up 2.04, CrCl 35 ml/min WBC have improved to wnl Afebrile since 1/29  Plan:  Continue Vancomycin 1224m IV q24h - expect further accumulation with rising SCr  Continue Zosyn 3.375gm IV q8h (4hr extended infusions) per MD  Follow up duration of therapy - per previous discussion with MD, plan 10-14 days total which will be around 2/4-2/8.  Temp (24hrs), Avg:98.1 F (36.7 C), Min:97.5 F (36.4 C), Max:99.8 F (37.7 C)   Recent Labs Lab 08/31/16 0448 09/01/16 0408 09/02/16 0413 09/03/16 0359 09/04/16 0514 09/05/16 0336 09/05/16 1210  WBC 8.3 6.7 5.8 6.6  --  6.6  --   CREATININE 1.36* 1.42* 1.64* 1.69* 1.84* 2.04*  --   VANCOTROUGH  --   --   --   --   --   --  14*    Estimated Creatinine Clearance: 35.4 mL/min (by C-G formula based on SCr of 2.04 mg/dL (H)).    Allergies  Allergen Reactions  . Lorazepam Other (See Comments)    Reaction:  Hallucinations   . Humira [Adalimumab] Other (See Comments)    Pt states that he got pancreatitis.      Antimicrobials this admission: 1/25 zosyn(MD) >> 1/26 vancomycin>>1/27, resume 1/31 >> 1/26 fluconazole >> 1/28  Dose adjustments this admission:  Microbiology results: 1/25 wound cx: abundant enterococcus (R=amp, S=Vanc), few Haemophilus parainfluenzae, beta lactamase  negative  Thank you for allowing pharmacy to be a part of this patient's care.  EPeggyann Juba PharmD, BCPS Pager: [phone]/10/2016 1:22 PM

## 2016-09-06 LAB — GLUCOSE, CAPILLARY
GLUCOSE-CAPILLARY: 138 mg/dL — AB (ref 65–99)
GLUCOSE-CAPILLARY: 148 mg/dL — AB (ref 65–99)
Glucose-Capillary: 143 mg/dL — ABNORMAL HIGH (ref 65–99)
Glucose-Capillary: 132 mg/dL — ABNORMAL HIGH (ref 65–99)

## 2016-09-06 LAB — COMPREHENSIVE METABOLIC PANEL WITH GFR
ALT: 25 U/L (ref 17–63)
AST: 25 U/L (ref 15–41)
Albumin: 2.2 g/dL — ABNORMAL LOW (ref 3.5–5.0)
Alkaline Phosphatase: 235 U/L — ABNORMAL HIGH (ref 38–126)
Anion gap: 8 (ref 5–15)
BUN: 34 mg/dL — ABNORMAL HIGH (ref 6–20)
CO2: 28 mmol/L (ref 22–32)
Calcium: 8.8 mg/dL — ABNORMAL LOW (ref 8.9–10.3)
Chloride: 98 mmol/L — ABNORMAL LOW (ref 101–111)
Creatinine, Ser: 2.28 mg/dL — ABNORMAL HIGH (ref 0.61–1.24)
GFR calc Af Amer: 31 mL/min — ABNORMAL LOW
GFR calc non Af Amer: 27 mL/min — ABNORMAL LOW
Glucose, Bld: 126 mg/dL — ABNORMAL HIGH (ref 65–99)
Potassium: 4.4 mmol/L (ref 3.5–5.1)
Sodium: 134 mmol/L — ABNORMAL LOW (ref 135–145)
Total Bilirubin: 0.9 mg/dL (ref 0.3–1.2)
Total Protein: 6.1 g/dL — ABNORMAL LOW (ref 6.5–8.1)

## 2016-09-06 MED ORDER — FAT EMULSION 20 % IV EMUL
250.0000 mL | INTRAVENOUS | Status: AC
  Administered 2016-09-06: 250 mL via INTRAVENOUS
  Filled 2016-09-06: qty 250

## 2016-09-06 MED ORDER — CLINIMIX E/DEXTROSE (5/20) 5 % IV SOLN
INTRAVENOUS | Status: AC
  Administered 2016-09-06: 17:00:00 via INTRAVENOUS
  Filled 2016-09-06: qty 960

## 2016-09-06 NOTE — Progress Notes (Signed)
[facility]:  [phone] General Surgery Progress Note   LOS: 10 days  POD -  10 Days Post-Op  Assessment/Plan: 1.  EXPLORATORYLAPAROTOMY, LYSIS OF ADHESIONS, ILEOSTOMY, RIGHT COLECTOMY with end ileostomy and Hartmann's pouch (at left transverse colon) - 08/27/2016 - D. Shevon Sian  For ischemic/infarcted right transverse colon  WBC - 6,600 - 09/05/2016  On Zosyn - 1/25 >>> and Vanc - 1/30 >>>  He is now 10 days of antibiotics - he is afebrile, WBC is normal, and CT scan on 2/1 showed no residual infection - will stop antibiotics.  His wound is okay.  His PO intake is still poor and he is weak.  2.  Open RESECTION TRANSVERSE COLON, Enterolysis of adhesions -  08/14/2016 - D. Rafay Dahan  For 1.2 cm adenocarcinoma of the right transverse colon, 0/7 nodes (T2, N0)  3. Gout involving left knee  Restart Uloric 4. ATRIAL FIBRILLATION, CONTROLLED (I48.91) Followed by Dr. Aundra Dubin for cardiology On Amiodarone Was on apixaban 5 mg BID - last dose 08/26/2016 - hold anti-coagulation for now 5. ASCENDING AORTIC ANEURYSM (I71.2) Impression: Followed by Dr. Servando Snare  6. History of Crohn's disesase Prior ileocecectomy around 84. He required a second operation. He had a fistula that took months to heal. He did not have straight forward surgery. He has has some prior SBO secondary to adhesions from that surgery 7. History of PE 8. History of Hep C - from blood transfusion during his bowel resection in the 1970's 9. Nephrolithiasis  10. Post tramatic stress syndrome - from service  On Paxil  11. Chronic renal insufficiency Creatinine - 2.28 - 09/06/2016  Seen by Dr. Lenna Sciara. Deterding q 3 months  Recheck creatinine tomorrow - if still increasing will discuss with renal  12.  DVT prophylaxis - on SQ Heparin 13.  Nutrition -   TPN - on 60 cc/hour  Will keep on TPN until he is  clearly taking po's well 14.  Anemia, acute blood loss  Hgb - 8.4 -09/03/2016    Active Problems:   Bowel perforation (HCC)   Colonic ischemia (HCC)   Pain of upper abdomen  Subjective:  Just got back from walk.  He is tired, but looks okay.  PO intake is still not poor.  No problems with ostomy.  Wife in the room  Objective:   Vitals:   09/05/16 2105 09/06/16 0440  BP: (!) 107/58 103/67  Pulse: 75 78  Resp: (!) 24 (!) 22  Temp: 98 F (36.7 C) 98.5 F (36.9 C)     Intake/Output from previous day:  02/03 0701 - 02/04 0700 In: 990 [P.O.:480; I.V.:10; IV Piggyback:500] Out: 2975 [Urine:1000; TIWPY:0998]  Intake/Output this shift:  No intake/output data recorded.   Physical Exam:   General:  WM who is alert and oriented.    HEENT: Normal. Pupils equal. .   Lungs: Better breath sounds.   Abdomen: BS present.  Ileostomy in RUQ - has output.     Wound: Clean - dressing changes BID   Lab Results:     Recent Labs  09/05/16 0336  WBC 6.6  HGB 8.4*  HCT 25.3*  PLT 228    BMET    Recent Labs  09/05/16 0336 09/06/16 0547  NA 133* 134*  K 4.4 4.4  CL 95* 98*  CO2 28 28  GLUCOSE 121* 126*  BUN 32* 34*  CREATININE 2.04* 2.28*  CALCIUM 8.5* 8.8*    PT/INR  No results for input(s): LABPROT, INR in the  last 72 hours.  ABG  No results for input(s): PHART, HCO3 in the last 72 hours.  Invalid input(s): PCO2, PO2   Studies/Results:  No results found.   Anti-infectives:   Anti-infectives    Start     Dose/Rate Route Frequency Ordered Stop   09/02/16 1300  vancomycin (VANCOCIN) 1,250 mg in sodium chloride 0.9 % 250 mL IVPB     1,250 mg 166.7 mL/hr over 90 Minutes Intravenous Every 24 hours 09/02/16 1155     08/28/16 0045  piperacillin-tazobactam (ZOSYN) IVPB 3.375 g  Status:  Discontinued     3.375 g 12.5 mL/hr over 240 Minutes Intravenous Every 8 hours 08/28/16 0033 08/28/16 0038   08/28/16 0034  piperacillin-tazobactam (ZOSYN) IVPB 3.375 g     3.375  g 12.5 mL/hr over 240 Minutes Intravenous Every 8 hours 08/28/16 0034     08/28/16 0015  vancomycin (VANCOCIN) IVPB 750 mg/150 ml premix  Status:  Discontinued     750 mg 150 mL/hr over 60 Minutes Intravenous Every 12 hours 08/28/16 0001 08/29/16 1454   08/28/16 0000  fluconazole (DIFLUCAN) IVPB 400 mg  Status:  Discontinued     400 mg 100 mL/hr over 120 Minutes Intravenous Every 24 hours 08/28/16 0000 08/30/16 0950   08/27/16 1715  piperacillin-tazobactam (ZOSYN) IVPB 3.375 g     3.375 g 100 mL/hr over 30 Minutes Intravenous  Once 08/27/16 1709 08/27/16 1631      [name], MD, FACS Pager: [facility] Office: [phone] 09/06/2016

## 2016-09-06 NOTE — Progress Notes (Signed)
PHARMACY - ADULT TOTAL PARENTERAL NUTRITION CONSULT NOTE   Pharmacy Consult for TPN Indication: Post-operative ileus   Patient Measurements: Ht 6'0", Wt 89.9 kg Height: 6' (182.9 cm) Weight: 189 lb 13.1 oz (86.1 kg) IBW/kg (Calculated) : 77.6  Recent Labs  09/04/16 0514 09/05/16 0336 09/06/16 0547  NA 133* 133* 134*  K 4.1 4.4 4.4  CL 94* 95* 98*  CO2 _0 GLUCOSE 138* 121* 126*  BUN 25* 32* 34*  CREATININE 1.84* 2.04* 2.28*  CALCIUM 8.4* 8.5* 8.8*  PHOS 3.9  --   --   MG 2.0  --   --   ALBUMIN  --  2.1* 2.2*  ALKPHOS  --  244* 235*  AST  --  25 25  ALT  --  25 25  BILITOT  --  0.5 0.9   Insulin Requirements: 12 units of SSI in the past 24hr  Current Nutrition: advanced to regular diet 2/1 in hopes of improving food choices  IVF: NS KVO  Central access: 08/27/16: double-lumen CVC TPN start date: 08/28/16  ASSESSMENT                                                                                                          HPI: 76 y/o M s/p laparoscopic resection of transverse colon and enterolysis of adhesions on 08/14/16.  Subsequently was found to have free air under diaphragm and was taken to OR on 08/27/16 for exploratory laparotomy, which found ischemic/infarcted R transverse colon, extensive intra-abdominal adhesions.  Patient underwent R colectomy with end ileostomy and Hartmann's pouch at L transverse colon, and lysis of adhesions x 90 minutes.  Orders received POD#1 to begin TPN with pharmacy assistance.    PMH is extensive and is outlined in the physician H&P from 08/27/16.  Problems of note from a nutrition standpoint include anemia, Vit B12 deficiency (takes 1000 mcg IM every 14 days, reportedly last taken 1/11), CKD III (baseline SCr ~ 1.4 to 1.8) with secondary hyperparathyroidism, enteric hyperoxaluria, gout, atrial fibrillation (on Eliquis PTA) hepatitis C (spontaneously cleared), LV dysfunction (EF 45-50% in 2015, normalized on subsequent  echoes).  Significant events:  1/26  POD#1 - in ICU; requiring fluid boluses and phenylephrine drip for pressor support. Spoke with Dr. [name] - OK to reduce mIVF when TPN begins. 1/28 - PCCM s/o but remains ICU status for now 1/29 - Low grade fevers overnight; patient complaining of "fullness" and not wanting to eat much. Reduced TPN from 83 >> 60 ml/min in effort to stimulate appetite and reduce "fullness" from fluid overload 1/30 - intake with little/no improvement overnight; doesn't like full liquid diet options; pt asking surgery to advance to soft for better options 2/1 - continue TPN until taking po's well 2/2 - spoke with Dr. [name], ok to begin weaning TPN since patient is tolerating some regular diet  Today, 09/06/2016:   Glucose - CBGs improved to goal of less than 150; no h/o DM  Electrolytes - Na/Cl low; all others wnl  Renal - BUN/Creatinine continue to rise, MD may  discuss with Renal if continues to rise; h/o CKD III; baseline SCr ~ 1.4-1.8  Hepatic - Albumin low; Alk phos continues to rise, now higher than admission (TPN related?); Tili back to WNL; Transaminases WNL.  TGs -  WNL, 85 (1/27), 94 (1/29), check in AM  Prealbumin - 10/(1/27), dropped to 5.8 (1/29), check in AM    NUTRITIONAL GOALS                                                                                             RD recs 1/26: Kcal:  5638-7564 (25-28 kcal/kg); Protein: 103-121 grams (1.2-1.4 grams/kg) Clinimix E 5/20 at a goal rate of 83 ml/hr + 20% fat emulsion at 222m/day to provide: 100 g/day protein, 2233 Kcal/day.  +++ Please note that due to national shortage, the Clinimix formulas available to [place] and can change on a daily basis +++  PLAN                                                                                                                       At 1800 tonight:  Continue Clinmix-E 5/20 at 40 ml/hr.   Continue IV lipid emulsion at 10 ml/hr x 12 hr.   Trace elements  are also on national shortage. Patient is ordered a daily multivitamin which contains an adequate amount of trace elements.  continue resistant scale ACHS  [facility]  TPN labs Mondays and Thursdays.  F/u tolerance of regular diet and potential to d/c TPN   EPeggyann Juba PharmD, BCPS Pager: [phone] 18212/11/2016, 9:49 AM

## 2016-09-07 LAB — COMPREHENSIVE METABOLIC PANEL
ALK PHOS: 227 U/L — AB (ref 38–126)
ALT: 26 U/L (ref 17–63)
ANION GAP: 11 (ref 5–15)
AST: 26 U/L (ref 15–41)
Albumin: 2.3 g/dL — ABNORMAL LOW (ref 3.5–5.0)
BILIRUBIN TOTAL: 0.7 mg/dL (ref 0.3–1.2)
BUN: 47 mg/dL — ABNORMAL HIGH (ref 6–20)
CO2: 27 mmol/L (ref 22–32)
Calcium: 9 mg/dL (ref 8.9–10.3)
Chloride: 96 mmol/L — ABNORMAL LOW (ref 101–111)
Creatinine, Ser: 2.45 mg/dL — ABNORMAL HIGH (ref 0.61–1.24)
GFR calc non Af Amer: 25 mL/min — ABNORMAL LOW (ref >60)
GFR, EST AFRICAN AMERICAN: 28 mL/min — AB (ref >60)
Glucose, Bld: 130 mg/dL — ABNORMAL HIGH (ref 65–99)
Potassium: 4.5 mmol/L (ref 3.5–5.1)
Sodium: 134 mmol/L — ABNORMAL LOW (ref 135–145)
TOTAL PROTEIN: 7.1 g/dL (ref 6.5–8.1)

## 2016-09-07 LAB — DIFFERENTIAL
BASOS ABS: 0.1 10*3/uL (ref 0.0–0.1)
BASOS PCT: 1 %
Eosinophils Absolute: 0.3 10*3/uL (ref 0.0–0.7)
Eosinophils Relative: 3 %
Lymphocytes Relative: 10 %
Lymphs Abs: 0.8 10*3/uL (ref 0.7–4.0)
MONO ABS: 0.6 10*3/uL (ref 0.1–1.0)
Monocytes Relative: 7 %
NEUTROS ABS: 6.6 10*3/uL (ref 1.7–7.7)
NEUTROS PCT: 79 %

## 2016-09-07 LAB — CBC
HCT: 29.8 % — ABNORMAL LOW (ref 39.0–52.0)
Hemoglobin: 9.4 g/dL — ABNORMAL LOW (ref 13.0–17.0)
MCH: 26.6 pg (ref 26.0–34.0)
MCHC: 31.5 g/dL (ref 30.0–36.0)
MCV: 84.4 fL (ref 78.0–100.0)
PLATELETS: 299 10*3/uL (ref 150–400)
RBC: 3.53 MIL/uL — ABNORMAL LOW (ref 4.22–5.81)
RDW: 14.5 % (ref 11.5–15.5)
WBC: 8.3 10*3/uL (ref 4.0–10.5)

## 2016-09-07 LAB — GLUCOSE, CAPILLARY
GLUCOSE-CAPILLARY: 140 mg/dL — AB (ref 65–99)
Glucose-Capillary: 141 mg/dL — ABNORMAL HIGH (ref 65–99)
Glucose-Capillary: 154 mg/dL — ABNORMAL HIGH (ref 65–99)

## 2016-09-07 LAB — PREALBUMIN: PREALBUMIN: 28.9 mg/dL (ref 18–38)

## 2016-09-07 LAB — PHOSPHORUS: PHOSPHORUS: 4.8 mg/dL — AB (ref 2.5–4.6)

## 2016-09-07 LAB — TRIGLYCERIDES: TRIGLYCERIDES: 143 mg/dL (ref <150)

## 2016-09-07 LAB — MAGNESIUM: MAGNESIUM: 2.1 mg/dL (ref 1.7–2.4)

## 2016-09-07 MED ORDER — FAT EMULSION 20 % IV EMUL
240.0000 mL | INTRAVENOUS | Status: DC
  Filled 2016-09-07: qty 250

## 2016-09-07 MED ORDER — TRAZODONE HCL 50 MG PO TABS: 50.0000 mg | ORAL_TABLET | Freq: Every day | ORAL | Status: DC

## 2016-09-07 MED ORDER — FAT EMULSION 20 % IV EMUL
120.0000 mL | INTRAVENOUS | Status: AC
  Administered 2016-09-07: 120 mL via INTRAVENOUS
  Filled 2016-09-07: qty 250

## 2016-09-07 MED ORDER — SODIUM CHLORIDE 0.9 % IV SOLN
INTRAVENOUS | Status: DC
  Administered 2016-09-07 – 2016-09-08 (×3): via INTRAVENOUS

## 2016-09-07 MED ORDER — SODIUM CHLORIDE 0.9 % IV BOLUS (SEPSIS)
500.0000 mL | Freq: Once | INTRAVENOUS | Status: AC
  Administered 2016-09-07: 500 mL via INTRAVENOUS

## 2016-09-07 MED ORDER — CLINIMIX E/DEXTROSE (5/20) 5 % IV SOLN
INTRAVENOUS | Status: AC
  Administered 2016-09-07: 17:00:00 via INTRAVENOUS
  Filled 2016-09-07: qty 960

## 2016-09-07 NOTE — Progress Notes (Signed)
Physical Therapy Treatment Patient Details Name: Derek [name] MRN: [id number] DOB: 1940/11/30 Today's Date: 09/07/2016    History of Present Illness HPI: Derek [name] is a 76 y.o. male with  history colon poylps, AAA,I, Crohn's disease, B12 deficiency, GERD, Gout, Hep C, PE, chronic thrombocytopenia,  PTSD, Lung nodule, HTN, RLS , on 08/14/16 admitted  for exploratory lap with lysis of adhesions, of adhesions, right colectomy. On 08/27/16  CT scan of the abdomen in the emergency department was consistent with breakdown of recent surgical anastomosis with the spillage of both air and apparent fecal matter as well as contrast of the peritoneum. He was taken urgently to the operating room for repair    PT Comments    Pt much more unsteady with amb today compared to last visit; advised pt and wife that he should use RW for amb for now to improve balance and safety (see below for gait details); encouraged ex's in bed and multiple shorter walks vs 1 long one; pt and wife verbalize understanding; will continue to follow  Follow Up Recommendations  Home health PT     Equipment Recommendations  Rolling walker with 5" wheels    Recommendations for Other Services       Precautions / Restrictions Precautions Precautions: Fall Precaution Comments: colostomy     Mobility  Bed Mobility Overal bed mobility: Needs Assistance Bed Mobility: Sit to Supine       Sit to supine: Supervision   General bed mobility comments: requiring supervision for lines, pt returning to bed with no awareness of line safety/position  Transfers Overall transfer level: Needs assistance Equipment used: Rolling walker (2 wheeled) Transfers: Sit to/from Stand Sit to Stand: Min guard Stand pivot transfers: Min guard       General transfer comment: cues for safety and hand placement  Ambulation/Gait Ambulation/Gait assistance: Min assist;Mod assist Ambulation Distance (Feet): 200 Feet Assistive device:  None Gait Pattern/deviations: Step-through pattern;Scissoring;Narrow base of support;Drifts right/left;Staggering left;Staggering right;Trunk flexed     General Gait Details: pt amb in hallway with wife, RN and IV pole--extremely unsteady with gait deviations as noted above; PT assisted back to room with LOB x4 with min to recover; pt tends toward anterior wt shift and near festinating gait at times   Stairs            Wheelchair Mobility    Modified Rankin (Stroke Patients Only)       Balance Overall balance assessment: Needs assistance Sitting-balance support: Feet supported;No upper extremity supported Sitting balance-Leahy Scale: Good Sitting balance - Comments: trunkflexed   Standing balance support: Single extremity supported Standing balance-Leahy Scale: Fair Standing balance comment: pt requires assist and/or UE support to maintain dynamic standing                    Cognition Arousal/Alertness: Awake/alert Behavior During Therapy: WFL for tasks assessed/performed Overall Cognitive Status: Within Functional Limits for tasks assessed                      Exercises General Exercises - Lower Extremity Ankle Circles/Pumps: AROM;Both;15 reps Quad Sets: AROM;Strengthening;Both;10 reps Gluteal Sets: AROM;Strengthening;Both;10 reps    General Comments        Pertinent Vitals/Pain Pain Assessment: Faces Faces Pain Scale: Hurts a little bit Pain Location: abdomen with activity Pain Descriptors / Indicators: Discomfort;Grimacing;Guarding Pain Intervention(s): Monitored during session    Home Living  Prior Function            PT Goals (current goals can now be found in the care plan section) Acute Rehab PT Goals Patient Stated Goal: home when better PT Goal Formulation: With patient Time For Goal Achievement: 09/14/16 Potential to Achieve Goals: Good Progress towards PT goals: Progressing toward goals     Frequency    Min 3X/week      PT Plan Current plan remains appropriate;Discharge plan needs to be updated    Co-evaluation             End of Session   Activity Tolerance: Patient tolerated treatment well Patient left: in bed;with call bell/phone within reach;with family/visitor present     Time: 4136-4383 PT Time Calculation (min) (ACUTE ONLY): 21 min  Charges:  $Gait Training: 8-22 mins                    G Codes:      Derek [name] 2016/10/05, 11:45 AM

## 2016-09-07 NOTE — Progress Notes (Signed)
PHARMACY - ADULT TOTAL PARENTERAL NUTRITION CONSULT NOTE   Pharmacy Consult for TPN Indication: Post-operative ileus   Patient Measurements: Ht 6'0", Wt 89.9 kg Height: 6' (182.9 cm) Weight: 189 lb 13.1 oz (86.1 kg) IBW/kg (Calculated) : 77.6  Recent Labs  09/06/16 0547 09/07/16 0527  NA 134* 134*  K 4.4 4.5  CL 98* 96*  CO2 28 27  GLUCOSE 126* 130*  BUN 34* 47*  CREATININE 2.28* 2.45*  CALCIUM 8.8* 9.0  PHOS  --  4.8*  MG  --  2.1  ALBUMIN 2.2* 2.3*  ALKPHOS 235* 227*  AST 25 26  ALT 25 26  BILITOT 0.9 0.7  TRIG  --  143  PREALBUMIN  --  28.9   Insulin Requirements: 9 units of SSI in the past 24hr  Current Nutrition: advanced to regular diet 2/1 in hopes of improving food choices.  Reports modest PO intake, meal intake not recorded well.   IVF: NS at 60 ml/hr  Central access: 08/27/16: double-lumen CVC TPN start date: 08/28/16  ASSESSMENT                                                                                                          HPI: 76 y/o M s/p laparoscopic resection of transverse colon and enterolysis of adhesions on 08/14/16.  Subsequently was found to have free air under diaphragm and was taken to OR on 08/27/16 for exploratory laparotomy, which found ischemic/infarcted R transverse colon, extensive intra-abdominal adhesions.  Patient underwent R colectomy with end ileostomy and Hartmann's pouch at L transverse colon, and lysis of adhesions x 90 minutes.  Orders received POD#1 to begin TPN with pharmacy assistance.    PMH is extensive and is outlined in the physician H&P from 08/27/16.  Problems of note from a nutrition standpoint include anemia, Vit B12 deficiency (takes 1000 mcg IM every 14 days, reportedly last taken 1/11), CKD III (baseline SCr ~ 1.4 to 1.8) with secondary hyperparathyroidism, enteric hyperoxaluria, gout, atrial fibrillation (on Eliquis PTA) hepatitis C (spontaneously cleared), LV dysfunction (EF 45-50% in 2015, normalized on subsequent  echoes).  Significant events:  1/26  POD#1 - in ICU; requiring fluid boluses and phenylephrine drip for pressor support. Spoke with Dr. [name] - OK to reduce mIVF when TPN begins. 1/28 - PCCM s/o but remains ICU status for now 1/29 - Low grade fevers overnight; patient complaining of "fullness" and not wanting to eat much. Reduced TPN from 83 >> 60 ml/min in effort to stimulate appetite and reduce "fullness" from fluid overload 1/30 - intake with little/no improvement overnight; doesn't like full liquid diet options; pt asking surgery to advance to soft for better options 2/1 - continue TPN until taking po's well 2/2 - spoke with Dr. [name], ok to begin weaning TPN since patient is tolerating some regular diet  Today, 09/07/2016:   Glucose - CBGs improved to goal of less than 150; no h/o DM  Electrolytes - Na/Cl low; all others wnl  Renal - BUN/Creatinine continue to rise, MD may discuss with Renal if continues to  rise; h/o CKD III; baseline SCr ~ 1.4-1.8  Hepatic - Albumin low; Alk phos elevated but decreasing (TPN related?); Tili back to WNL; Transaminases WNL.  TGs -  WNL, 85 (1/27), 94 (1/29), 143 (2/5)  Prealbumin - 10/(1/27), dropped to 5.8 (1/29), 28.9 (2/5)   NUTRITIONAL GOALS                                                                                             RD recs (updated 2/2): Kcal:  4270-6237 (25-28 kcal/kg); Protein: 86-103 grams (1-1.2 grams/kg) Clinimix E 5/20 at a goal rate of 83 ml/hr + 20% fat emulsion at 236m/day to provide: 100 g/day protein, 2233 Kcal/day.  +++ Please note that due to national shortage, the Clinimix formulas available to [place] limited and can change on a daily basis +++  PLAN                                                                                                                       At 1800 tonight:  Continue Clinmix-E 5/20 at 40 ml/hr.   Continue IV lipid emulsion at 10 ml/hr x 12 hr.   Trace elements are also on  national shortage. Patient is ordered a daily multivitamin which contains an adequate amount of trace elements.  Continue resistant scale ACHS  IV fluids changed to 60 ml/hr per surgery for total of 100 ml/hr  TPN labs Mondays and Thursdays.  F/u tolerance of regular diet and potential to d/c TPN    CGretta ArabPharmD, BCPS Pager [phone]/12/2016 12:15 PM

## 2016-09-07 NOTE — Consult Note (Signed)
Renal Service Consult Note [name]  [name] 09/07/2016 [name] Requesting Physician:  Dr. Lucia [name]  Reason for Consult:  Acute on CRF HPI: The patient is a 76 y.o. year-old with history of HTN, gout, CKD stage 3/4, hep C, Crohn's, PTSD and BPH. Patient was here in early Jan 2018 with colon Ca for resection of the transverse colon.  Then presented on 1/25 here with abd pain and free air in the abdomen.  Went to surgery for ischemia bowel/ infarcted R colon with probable bowel leak.  Underwent R colectomy with ileostomy.  Creat was stable at baseline 1.4 - 1.8 postop, but over the last 3 days has risne to 2.45.  Pt has been eating poorly. Is on TPN.  GI losses have been significant with new ileostomy, averaging 1.7- 2.0 L over the last few days.  Asked to see for A/CRF.    Got IV Vanc for 4 days, dc'd y esterday due to rising creatinine. Is on Zosyn, no IV contrast, no nsaid's or ACEi/ ARB.  BP's are soft int he 90's.   F/B Dr Deterding for 10-63yr for CKD, baseline creat 1.5- 1.8.  Hx gout.  [place] in [name] finished HS at [name] [name] worked various manual jobs iEngineer, petroleum maintenance, is now retired and lives with his wife in [name] 3 grown children.  No tob/ etoh.   ROS  denies CP  no joint pain   no HA  no blurry vision  no rash  no diarrhea  no nausea/ vomiting  no dysuria  no difficulty voiding  no change in urine color    Past Medical History  Past Medical History:  Diagnosis Date  . Anemia   . Anxiety   . Aortic insufficiency    a. mild-mod by echo 09/2015.  . Arthritis    "knees; left shoulder" (09/21/2013)  . Ascending aortic aneurysm (HCC)    a. last measurement 5.3 cm 03/2016 -> f/u planned 09/2015 to continue to follow.  . Atrial fibrillation ([facility]   . B12 deficiency    takes Vit 12 shot every 14days   . Cataract   . CKD (chronic kidney disease) stage 3, GFR 30-59 ml/min 08/22/2011  . Clotting  disorder ([place]   . Crohn's disease ([place]   . Depression   . Enlarged prostate   . Enteric hyperoxaluria ([place] 02/21/2016  . GERD (gastroesophageal reflux disease)    takes Omeprazole daily  . Gout    takes Uloric and Colchicine daily  . Heart murmur   . Hepatitis C 1978   negtive RNA load - spontaneously cleared  . Hiatal hernia   . History of blood transfusion 1978; 1990's; ?   "w/bowel resection; S/P allupurinol; ?" (09/21/2013)  . History of colon polyps   . History of kidney stones   . History of MRSA infection 2010  . History of pulmonary embolism 2006   both legs and both lungs /notes 08/26/2008 (09/21/2013)  . History of small bowel obstruction   . History of staph infection 1978  . Hyperoxaluria (HCC)    Intestinal  . Hypertension   . Insomnia    takes Trazodone nightly  . Internal hemorrhoids   . LV dysfunction    a. h/o EF 45-50% in 2015, normalized on subsequent echoes.  . Nephrolithiasis   . Pancreatitis 2010   elevated lipase and amylase, stranding in tail of pancreas, ? from Humira  . Pancytopenia  Hx of  . Peripheral neuropathy (HCC)    takes Gabapentin daily  . Pneumonia    hx of   . Post-traumatic stress syndrome    takes Paxil nightly  . PTSD (post-traumatic stress disorder)   . Pulmonary nodule    a. 32m by CT 05/2015, recommended f/u 6-12 months.  . RLS (restless legs syndrome)   . Rosacea conjunctivitis(372.31)    takes Minocin daily  . Secondary hyperparathyroidism ([place] 02/21/2016  . Sinus bradycardia   . Skin cancer    "cut/burned off left ear and face" (09/21/2013)  . Small bowel obstruction   . Thrombocytopenia ([place]    hx of   Past Surgical History  Past Surgical History:  Procedure Laterality Date  . ANKLE SURGERY Right   . APPENDECTOMY  1978  . BOWEL RESECTION  1978 X 2  . CARDIOVERSION N/A 05/29/2016   Procedure: CARDIOVERSION;  Surgeon: MSanda Klein [name];  Location: [facility];  Service: Cardiovascular;  Laterality: N/A;  .  CHOLECYSTECTOMY    . COLON RESECTION N/A 08/14/2016   Procedure: LAPAROSCOPIC RESECTION TRANSVERSE COLON;  Surgeon: DAlphonsa Overall [name];  Location: [facility];  Service: General;  Laterality: N/A;  . COLON SURGERY    . COLONOSCOPY    . ESOPHAGOGASTRODUODENOSCOPY    . EYE SURGERY     cataract surgery bilateral  . FOOT SURGERY Right    "took gout out"  . HEMICOLECTOMY Right   . ILEOCECETOMY  1978   /[place] Endo10/03/2000  (09/21/2013)  . INGUINAL HERNIA REPAIR Right   . KNEE ARTHROSCOPY Left   . LAPAROTOMY N/A 08/27/2016   Procedure: EXPLORATORYLAPAROTOMY, LYSIS OF ADHESIONS, ILEOSTOMY, RIGHT COLECTOMY;  Surgeon: DAlphonsa Overall [name];  Location: [facility];  Service: General;  Laterality: N/A;  . LIGAMENT REPAIR Left   . TEE WITHOUT CARDIOVERSION N/A 05/29/2016   Procedure: TRANSESOPHAGEAL ECHOCARDIOGRAM (TEE);  Surgeon: MSanda Klein [name];  [place]: [facility]  Service: Cardiovascular;  Laterality: N/A;  . TOTAL SHOULDER ARTHROPLASTY Left 09/21/2013  . TOTAL SHOULDER ARTHROPLASTY Left 09/21/2013   Procedure: LEFT TOTAL SHOULDER ARTHROPLASTY;  Surgeon: [place] Shutter [name];  Location: [facility]  Service: Orthopedics;  Laterality: Left;   Family History  Family History  Problem Relation Age of Onset  . Kidney disease Father   . Hypertension Father   . Aneurysm Mother   . Aneurysm Sister   . Esophageal cancer Neg Hx   . Stomach cancer Neg Hx   . Rectal cancer Neg Hx    Social History  reports that he quit smoking about 41 years ago. His smoking use included Cigarettes. He has a 40.00 pack-year smoking history. He quit smokeless tobacco use about 38 years ago. His smokeless tobacco use included Chew. He reports that he does not drink alcohol or use drugs. Allergies  Allergies  Allergen Reactions  . Lorazepam Other (See Comments)    Reaction:  Hallucinations   . Humira [Adalimumab] Other (See Comments)    Pt states that he got pancreatitis.     Home medications Prior to Admission medications    Medication Sig Start Date End Date Taking? Authorizing Provider  Acidophilus Lactobacillus CAPS Take 1 capsule by mouth daily. Patient taking differently: Take 1 capsule by mouth at bedtime.  06/08/16  Yes CGatha Mayer [name]  amiodarone (PACERONE) 200 MG tablet Take 1 tablet (200 mg total) by mouth 2 (two) times daily. 07/02/16  Yes Brittainy MErie Noe PA-C  calcium carbonate (TUMS - DOSED IN MG ELEMENTAL CALCIUM) 500 MG  chewable tablet Chew 2 tablets by mouth 2 (two) times daily as needed.    Yes Historical Provider, [name]  Cholecalciferol (VITAMIN D3) 1000 units CAPS Take 1,000 Units by mouth daily.   Yes Historical Provider, [name]  cyanocobalamin (,VITAMIN B-12,) 1000 MCG/ML injection Inject 1,000 mcg into the muscle every 14 (fourteen) days.   Yes Historical Provider, [name]  cycloSPORINE (RESTASIS) 0.05 % ophthalmic emulsion Place 1 drop into both eyes 2 (two) times daily.    Yes Historical Provider, [name]  ELIQUIS 5 MG TABS tablet Take 5 mg by mouth 2 (two) times daily. 08/10/16  Yes Historical Provider, [name]  febuxostat (ULORIC) 40 MG tablet Take 80 mg by mouth daily.   Yes Historical Provider, [name]  gabapentin (NEURONTIN) 100 MG capsule Take 100-200 mg by mouth 2 (two) times daily. Pt takes one tablet in the morning and two at night.   Yes Historical Provider, [name]  HYDROcodone-acetaminophen (NORCO/VICODIN) 5-325 MG tablet Take 1-2 tablets by mouth every 6 (six) hours as needed for moderate pain. 08/21/16  Yes [name], [name]  Magnesium Oxide 420 MG TABS Take 420 mg by mouth 2 (two) times daily.   Yes Historical Provider, [name]  minocycline (MINOCIN,DYNACIN) 100 MG capsule Take 100 mg by mouth daily as needed (when breakout occurs on face).    Yes Historical Provider, [name]  Multiple Vitamin (MULTIVITAMIN WITH MINERALS) TABS tablet Take 1 tablet by mouth daily.   Yes Historical Provider, [name]  omeprazole (PRILOSEC) 20 MG capsule Take 20 mg by mouth daily.     Yes Historical Provider, [name]  PARoxetine (PAXIL) 20 MG tablet  Take 20 mg by mouth at bedtime.   Yes Historical Provider, [name]  Polyvinyl Alcohol-Povidone (REFRESH OP) Apply 1 drop to eye 2 (two) times daily.   Yes Historical Provider, [name]  potassium citrate (UROCIT-K) 10 MEQ (1080 MG) SR tablet Take 10 mEq by mouth 2 (two) times daily.   Yes Historical Provider, [name]  promethazine (PHENERGAN) 25 MG tablet Take 25 mg by mouth every 8 (eight) hours as needed for nausea or vomiting.  06/04/16  Yes Historical Provider, [name]  terazosin (HYTRIN) 2 MG capsule Take 2 mg by mouth at bedtime.    Yes Historical Provider, [name]  traZODone (DESYREL) 150 MG tablet Take 150 mg by mouth at bedtime.     Yes Historical Provider, [name]  saccharomyces boulardii (FLORASTOR) 250 MG capsule Take 1 capsule (250 mg total) by mouth 2 (two) times daily. Patient not taking: Reported on 07/31/2016 06/02/16   Theodis Blaze, [name]   Liver Function Tests  Recent Labs Lab 09/05/16 [phone] 09/06/16 0547 09/07/16 0527  AST 25 25 26   ALT 25 25 26   ALKPHOS 244* 235* 227*  BILITOT 0.5 0.9 0.7  PROT 5.9* 6.1* 7.1  ALBUMIN 2.1* 2.2* 2.3*   No results for input(s): LIPASE, AMYLASE in the last 168 hours. CBC  Recent Labs Lab 09/03/16 0359 09/05/16 0336 09/07/16 0527  WBC 6.6 6.6 8.3  NEUTROABS 5.4 5.2 6.6  HGB 8.4* 8.4* 9.4*  HCT 25.1* 25.3* 29.8*  MCV 84.8 84.9 84.4  PLT 206 228 960   Basic Metabolic Panel  Recent Labs Lab 09/01/16 0408 09/02/16 0413 09/03/16 0359 09/04/16 0514 09/05/16 0336 09/06/16 0547 09/07/16 0527  NA 132* 134* 136 133* 133* 134* 134*  K 3.5 3.8 3.8 4.1 4.4 4.4 4.5  CL 98* 98* 98* 94* 95* 98* 96*  CO2 26 28 29 29 28 28 27   GLUCOSE 158* 153* 137* 138*  121* 126* 130*  BUN 21* 23* 23* 25* 32* 34* 47*  CREATININE 1.42* 1.64* 1.69* 1.84* 2.04* 2.28* 2.45*  CALCIUM 8.1* 8.2* 8.3* 8.4* 8.5* 8.8* 9.0  PHOS 4.0  --  3.9 3.9  --   --  4.8*   Iron/TIBC/Ferritin/ %Sat No results found for: IRON, TIBC, FERRITIN, IRONPCTSAT  Vitals:   09/06/16 1453 09/06/16 2236  09/07/16 0637 09/07/16 1435  BP: 110/60 94/63 (!) 96/56 98/63  Pulse: 72 69 75 72  Resp: 20 20 20 20   Temp: 98.9 F (37.2 C) 97.8 F (36.6 C) 98.2 F (36.8 C) 97.2 F (36.2 C)  TempSrc: Oral Axillary Oral Axillary  SpO2: 96% 99% 96% 96%  Weight:      Height:       Exam Gen alert, no distress, dry mouth No rash, cyanosis or gangrene Sclera anicteric, throat clear  No jvd or bruits Chest clear bilat RRR no MRG Abd large ileostomy bag w brownish liquid, +bs, no ascites GU normal male MS no joint effusions or deformity Ext no LE or UE edema / no wounds or ulcers Neuro is alert, Ox 3 , nf   Assessment: 1. Acute on CRF - due to hypovolemia from GI losses/ hypotension / hypoperfusion.  DC Hytrin, increase IVF"s 2 L / day minimum given GI losses.  Get BP up.   2. SP R colectomy/ infarcted bowel 3. Hx colon Ca - sp transverse colectomy Jan 2018 4. Gout 5. Vol depletion    Plan - as above  Kelly Splinter [name] [employer] pager [phone]   09/07/2016, 5:46 PM

## 2016-09-07 NOTE — Care Management Important Message (Signed)
Important Message  Patient Details  Name: [name] MRN: [id number] Date of Birth: 12/31/1940   Medicare Important Message Given:  Yes    Kerin Salen 09/07/2016, 12:42 [place] Message  Patient Details  Name: [name] MRN: [id number] Date of Birth: Dec 27, 1940   Medicare Important Message Given:  Yes    Kerin Salen 09/07/2016, 12:42 PM

## 2016-09-07 NOTE — Progress Notes (Signed)
Nutrition Follow-up  INTERVENTION:   TPN per Pharmacy  -Family to continue to provide Muscle Milk supplements from home (provide 100 kcal and 20g protein) -Continue Magic cup TID with meals, each supplement provides 290 kcal and 9 grams of protein -RD to continue to monitor  NUTRITION DIAGNOSIS:   Inadequate oral intake related to inability to eat as evidenced by NPO status.  Now on regular diet.  GOAL:   Patient will meet greater than or equal to 90% of their needs  Progressing.  MONITOR:   PO intake, Supplement acceptance, Weight trends, Labs, I & O's, Other (Comment) (TPN regimen)  ASSESSMENT:   Pt with extensive history and s/p laparoscopic resection of transverse colon and enterolysis of adhesion for 2 hours on 08/14/16 by Dr. [name].  Post op he had some gout and got some steroids for this he was on a soft diet and ready for discharge on the 7th post op day.  He has developed a cough and was sent to the ED for evaluation after xray showed a significant amount of free air.  Pt still has abdominal pain.  He cannot lie down and sleeps in the chair at home.  He started coughing and called his PCP yesterday.  He denies SOB and sats are good on RA here in the ED lying down off O2.  He has a productive cough and is uncomfortable with that.  He also has pain sitting up or lying down.  He was sent after the CXR showed free air under the diaphragm.   Patient sleeping with wife at bedside. Per wife, pt has not eaten much today. He did drink 2 of his Muscle Milk drinks. Last night he ate 1/2 of a [employer]. Pt prefers the orange flavor, will specify in order. Per surgery note, TPN will begin weaning, now that pt is tolerating solid food. Pt's wife states she brought some food from outside the hospital (meatloaf, potatoes), plans to get the pt to eat once he wakes up from his nap. Lunch tray from kitchen sitting in room, untouched.  Medications: Multivitamin with minerals daily, Protonix  tablet daily Labs reviewed: CBGs: 141-154 Low Na Elevated Phos GFR: 25  Plan per Pharmacy 2/5: At 1800 tonight: ? Continue Clinmix-E 5/20 at 40 ml/hr.  ? Continue IV lipid emulsion at 10 ml/hr x 12 hr.   Diet Order:  Diet regular Room service appropriate? Yes; Fluid consistency: Thin .TPN (CLINIMIX-E) Adult .TPN (CLINIMIX-E) Adult  Skin:  Wound (see comment) (Abdominal incision from surgery 1/25)  Last BM:  2/4  Height:   Ht Readings from Last 1 Encounters:  08/28/16 6' (1.829 m)    Weight:   Wt Readings from Last 1 Encounters:  08/28/16 189 lb 13.1 oz (86.1 kg)    Ideal Body Weight:  80.91 kg  BMI:  Body mass index is 25.74 kg/m.  Estimated Nutritional Needs:   Kcal:  7253-6644 (25-28 kcal/kg)  Protein:  86-103 grams (1-1.2 grams/kg)  Fluid:  >/= 2 L/day  EDUCATION NEEDS:   No education needs identified at this time  Clayton Bibles, MS, RD, LDN Pager: [phone] After Hours Pager: [phone]

## 2016-09-07 NOTE — Progress Notes (Signed)
[name]:  [phone] General Surgery Progress Note   LOS: 11 days  POD -  11 Days Post-Op  Assessment/Plan: 1.  EXPLORATORYLAPAROTOMY, LYSIS OF ADHESIONS, ILEOSTOMY, RIGHT COLECTOMY with end ileostomy and Hartmann's pouch (at left transverse colon) - 08/27/2016 - D. Chole Driver  For ischemic/infarcted right transverse colon  WBC - 8,300 - 09/05/2016  On Zosyn - 1/25 >>>2/4 and Vanc - 1/30 >>>2/4  Wound packed open - it is clean  On regular food.    But ileostomy output was 2,300 cc yesterday.  I don't think that he is keeping up with this.  Will increaser IVF and give bolus of NS now.    2.  Open RESECTION TRANSVERSE COLON, Enterolysis of adhesions -  08/14/2016 - D. Carolene Gitto  For 1.2 cm adenocarcinoma of the right transverse colon, 0/7 nodes (T2, N0)  3. Chronic renal insufficiency Creatinine - 2.04 - 09/05/2016  Creatinine - 2.45 - 09/07/2016 BUN also increasing.  Most likely volume contraction with high output ileostomy.  Will increase fluid.  Followed by Dr. Lenna Sciara. Deterding q 3 months  With increasing creatinine, will consult renal.  Discussed with patient.  4. ATRIAL FIBRILLATION, CONTROLLED (I48.91) Followed by Dr. Aundra Dubin for cardiology On Amiodarone Was on apixaban 5 mg BID - last dose 08/26/2016 - hold anti-coagulation for now 5. ASCENDING AORTIC ANEURYSM (I71.2) Impression: Followed by Dr. Servando Snare  6. History of Crohn's disesase Prior ileocecectomy around 75. He required a second operation. He had a fistula that took months to heal. He did not have straight forward surgery. He has has some prior SBO secondary to adhesions from that surgery 7. History of PE 8. History of Hep C - from blood transfusion during his bowel resection in the 1970's 9. Nephrolithiasis 10. Post tramatic stress syndrome - from service  On Paxil  11. Gout involving left  knee  Restart Uloric  12.  DVT prophylaxis - on SQ Heparin 13.  Nutrition -   TPN - on 60 cc/hour  Will keep on TPN until he is clearly taking po's well 14.  Anemia, acute blood loss  Hgb - 9.4 -09/07/2016    Active Problems:   Bowel perforation (HCC)   Colonic ischemia (HCC)   Pain of upper abdomen  Subjective:  Still very weak.  Taking modest po.  He does better with breakfast, but not much else through the day.  Also trouble sleeping.  Wife in the room  Objective:   Vitals:   09/06/16 2236 09/07/16 0637  BP: 94/63 (!) 96/56  Pulse: 69 75  Resp: 20 20  Temp: 97.8 F (36.6 C) 98.2 F (36.8 C)     Intake/Output from previous day:  02/04 0701 - 02/05 0700 In: 518.7 [I.V.:518.7] Out: 2875 [Urine:600; Stool:2275]  Intake/Output this shift:  Total I/O In: -  Out: 500 [Urine:300; Stool:200]   Physical Exam:   General: WN WM who is alert and oriented.    HEENT: Normal. Pupils equal. .   Lungs: Better breath sounds.   Abdomen:  Ileostomy in RUQ  Measured output yesterday = 2,356 cc.   Wound: Clean - dressing changes BID   Lab Results:     Recent Labs  09/05/16 0336 09/07/16 0527  WBC 6.6 8.3  HGB 8.4* 9.4*  HCT 25.3* 29.8*  PLT 228 299    BMET    Recent Labs  09/06/16 0547 09/07/16 0527  NA 134* 134*  K 4.4 4.5  CL 98* 96*  CO2 28 27  GLUCOSE 126* 130*  BUN 34* 47*  CREATININE 2.28* 2.45*  CALCIUM 8.8* 9.0    PT/INR  No results for input(s): LABPROT, INR in the last 72 hours.  ABG  No results for input(s): PHART, HCO3 in the last 72 hours.  Invalid input(s): PCO2, PO2   Studies/Results:  No results found.   Anti-infectives:   Anti-infectives    Start     Dose/Rate Route Frequency Ordered Stop   09/02/16 1300  vancomycin (VANCOCIN) 1,250 mg in sodium chloride 0.9 % 250 mL IVPB  Status:  Discontinued     1,250 mg 166.7 mL/hr over 90 Minutes Intravenous Every 24 hours 09/02/16 1155 09/06/16 0921   08/28/16 0045  piperacillin-tazobactam  (ZOSYN) IVPB 3.375 g  Status:  Discontinued     3.375 g 12.5 mL/hr over 240 Minutes Intravenous Every 8 hours 08/28/16 0033 08/28/16 0038   08/28/16 0034  piperacillin-tazobactam (ZOSYN) IVPB 3.375 g  Status:  Discontinued     3.375 g 12.5 mL/hr over 240 Minutes Intravenous Every 8 hours 08/28/16 0034 09/06/16 0921   08/28/16 0015  vancomycin (VANCOCIN) IVPB 750 mg/150 ml premix  Status:  Discontinued     750 mg 150 mL/hr over 60 Minutes Intravenous Every 12 hours 08/28/16 0001 08/29/16 1454   08/28/16 0000  fluconazole (DIFLUCAN) IVPB 400 mg  Status:  Discontinued     400 mg 100 mL/hr over 120 Minutes Intravenous Every 24 hours 08/28/16 0000 08/30/16 0950   08/27/16 1715  piperacillin-tazobactam (ZOSYN) IVPB 3.375 g     3.375 g 100 mL/hr over 30 Minutes Intravenous  Once 08/27/16 1709 08/27/16 1631      [name], MD, FACS Pager: [facility]: [phone] 09/07/2016

## 2016-09-08 LAB — BASIC METABOLIC PANEL WITH GFR
Anion gap: 9 (ref 5–15)
BUN: 49 mg/dL — ABNORMAL HIGH (ref 6–20)
CO2: 25 mmol/L (ref 22–32)
Calcium: 8.8 mg/dL — ABNORMAL LOW (ref 8.9–10.3)
Chloride: 101 mmol/L (ref 101–111)
Creatinine, Ser: 2.28 mg/dL — ABNORMAL HIGH (ref 0.61–1.24)
GFR calc Af Amer: 31 mL/min — ABNORMAL LOW
GFR calc non Af Amer: 27 mL/min — ABNORMAL LOW
Glucose, Bld: 120 mg/dL — ABNORMAL HIGH (ref 65–99)
Potassium: 4.7 mmol/L (ref 3.5–5.1)
Sodium: 135 mmol/L (ref 135–145)

## 2016-09-08 LAB — GLUCOSE, CAPILLARY
GLUCOSE-CAPILLARY: 155 mg/dL — AB (ref 65–99)
GLUCOSE-CAPILLARY: 144 mg/dL — AB (ref 65–99)
Glucose-Capillary: 140 mg/dL — ABNORMAL HIGH (ref 65–99)
Glucose-Capillary: 123 mg/dL — ABNORMAL HIGH (ref 65–99)

## 2016-09-08 MED ORDER — SODIUM CHLORIDE 0.9 % IV SOLN
INTRAVENOUS | Status: DC
  Administered 2016-09-08 – 2016-09-14 (×10): via INTRAVENOUS

## 2016-09-08 MED ORDER — PROMETHAZINE HCL 25 MG/ML IJ SOLN
12.5000 mg | Freq: Once | INTRAMUSCULAR | Status: AC
  Administered 2016-09-08: 12.5 mg via INTRAVENOUS
  Filled 2016-09-08: qty 1

## 2016-09-08 MED ORDER — FERROUS SULFATE 325 (65 FE) MG PO TABS
325.0000 mg | ORAL_TABLET | Freq: Every day | ORAL | Status: DC
  Administered 2016-09-08 – 2016-09-10 (×3): 325 mg via ORAL
  Filled 2016-09-08 (×3): qty 1

## 2016-09-08 MED ORDER — FAT EMULSION 20 % IV EMUL
120.0000 mL | INTRAVENOUS | Status: AC
  Administered 2016-09-08: 120 mL via INTRAVENOUS
  Filled 2016-09-08: qty 250

## 2016-09-08 MED ORDER — CLINIMIX E/DEXTROSE (5/20) 5 % IV SOLN
INTRAVENOUS | Status: AC
  Administered 2016-09-08: 18:00:00 via INTRAVENOUS
  Filled 2016-09-08: qty 960

## 2016-09-08 MED ORDER — LOPERAMIDE HCL 2 MG PO CAPS
2.0000 mg | ORAL_CAPSULE | Freq: Two times a day (BID) | ORAL | Status: DC
  Administered 2016-09-08 – 2016-09-09 (×4): 2 mg via ORAL
  Filled 2016-09-08 (×4): qty 1

## 2016-09-08 MED ORDER — PSYLLIUM 95 % PO PACK
1.0000 | PACK | Freq: Every day | ORAL | Status: DC
  Administered 2016-09-08 – 2016-09-09 (×2): 1 via ORAL
  Filled 2016-09-08 (×3): qty 1

## 2016-09-08 NOTE — Progress Notes (Signed)
[facility]:  [phone] General Surgery Progress Note   LOS: 12 days  POD -  12 Days Post-Op  Assessment/Plan: 1.  EXPLORATORYLAPAROTOMY, LYSIS OF ADHESIONS, ILEOSTOMY, RIGHT COLECTOMY with end ileostomy and Hartmann's pouch (at left transverse colon) - 08/27/2016 - D. Evania Lyne  For ischemic/infarcted right transverse colon  Completed Zosyn - 1/25 >>>2/4 and Vanc - 1/30 >>>2/4  Wound packed open - it is clean  On regular food.    Ileostomy 3,300 cc recorded last 24 hours - will start meds to try to slow down ileostomy output.  Will start with Fe, metamucil (soluble fiber) and imodium.  I can go up on all these meds, depending on how he does.  2.  Open RESECTION TRANSVERSE COLON, Enterolysis of adhesions -  08/14/2016 - D. 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 Chronic renal insufficiency - appears to be secondary to volume contraction secondary to high output ileostomy.  Primary issue is managing his volume and output from his ileostomy Creatinine - 2.04 - 09/05/2016  Creatinine - 2.45 - 09/07/2016   Creatinine - 2.28 - 09/08/2016  Followed by Dr. Lenna Sciara. Deterding q 3 months  Appreciate Dr. Barkley Bruns input   4. ATRIAL FIBRILLATION, CONTROLLED (I48.91) Followed by Dr. Aundra Dubin for cardiology On Amiodarone Was on apixaban 5 mg BID - last dose 08/26/2016 - hold anti-coagulation for now 5. ASCENDING AORTIC ANEURYSM (I71.2) Impression: Followed by Dr. Servando Snare  6. History of Crohn's disesase Prior ileocecectomy around 71. He required a second operation. He had a fistula that took months to heal. He did not have straight forward surgery. He has has some prior SBO secondary to adhesions from that surgery 7. History of PE 8. History of Hep C - from blood transfusion during his bowel resection in the 1970's 9.  Nephrolithiasis 10. Post tramatic stress syndrome - from service  On Paxil  11. Gout involving left knee  Restart Uloric  12.  DVT prophylaxis - on SQ Heparin 13.  Nutrition -   Prealbumin - 28 - 09/06/2016 (substantial improvement over last prealbumin)  TPN - on 40 cc/hour  Will keep on TPN until he is clearly taking po's well 14.  Anemia, acute blood loss  Hgb - 9.4 -09/07/2016    Active Problems:   Bowel perforation (HCC)   Colonic ischemia (HCC)   Pain of upper abdomen  Subjective:  Weak, but does not look as washed out as yesterday.  Has not walked yet.  PO intake, maybe better.  His wife is not in the room.  Objective:   Vitals:   09/07/16 2044 09/08/16 0455  BP: 99/64 111/62  Pulse: 74 71  Resp: 19 (!) 22  Temp: 97.3 F (36.3 C) 97.5 F (36.4 C)     Intake/Output from previous day:  02/05 0701 - 02/06 0700 In: 1828 [P.O.:522; I.V.:1306] Out: 5056 [Urine:1075; PVXYI:0165]  Intake/Output this shift:  No intake/output data recorded.   Physical Exam:   General: WN WM who is alert and oriented.    HEENT: Normal. Pupils equal. .   Lungs: Better breath sounds.   Abdomen:  Ileostomy in RUQ  Measured output yesterday = 3,375 cc.   Wound: Clean - dressing changes BID   Lab Results:     Recent Labs  09/07/16 0527  WBC 8.3  HGB 9.4*  HCT 29.8*  PLT 299    BMET    Recent Labs  09/07/16 0527 09/08/16 0528  NA 134* 135  K 4.5 4.7  CL 96* 101  CO2 27 25  GLUCOSE 130* 120*  BUN 47* 49*  CREATININE 2.45* 2.28*  CALCIUM 9.0 8.8*    PT/INR  No results for input(s): LABPROT, INR in the last 72 hours.  ABG  No results for input(s): PHART, HCO3 in the last 72 hours.  Invalid input(s): PCO2, PO2   Studies/Results:  No results found.   Anti-infectives:   Anti-infectives    Start     Dose/Rate Route Frequency Ordered Stop   09/02/16 1300  vancomycin (VANCOCIN) 1,250 mg in sodium chloride 0.9 % 250 mL IVPB  Status:  Discontinued     1,250  mg 166.7 mL/hr over 90 Minutes Intravenous Every 24 hours 09/02/16 1155 09/06/16 0921   08/28/16 0045  piperacillin-tazobactam (ZOSYN) IVPB 3.375 g  Status:  Discontinued     3.375 g 12.5 mL/hr over 240 Minutes Intravenous Every 8 hours 08/28/16 0033 08/28/16 0038   08/28/16 0034  piperacillin-tazobactam (ZOSYN) IVPB 3.375 g  Status:  Discontinued     3.375 g 12.5 mL/hr over 240 Minutes Intravenous Every 8 hours 08/28/16 0034 09/06/16 0921   08/28/16 0015  vancomycin (VANCOCIN) IVPB 750 mg/150 ml premix  Status:  Discontinued     750 mg 150 mL/hr over 60 Minutes Intravenous Every 12 hours 08/28/16 0001 08/29/16 1454   08/28/16 0000  fluconazole (DIFLUCAN) IVPB 400 mg  Status:  Discontinued     400 mg 100 mL/hr over 120 Minutes Intravenous Every 24 hours 08/28/16 0000 08/30/16 0950   08/27/16 1715  piperacillin-tazobactam (ZOSYN) IVPB 3.375 g     3.375 g 100 mL/hr over 30 Minutes Intravenous  Once 08/27/16 1709 08/27/16 1631      [name], MD, FACS Pager: [facility]: [phone] 09/08/2016

## 2016-09-08 NOTE — Plan of Care (Addendum)
Problem: Pain Managment: Goal: General experience of comfort will improve Outcome: Progressing  Pt c/o surgical pain to abdomen.   Norco 1 tab prn given.   Pt able to sleep.  Continue.     Problem: Skin Integrity: Goal: Risk for impaired skin integrity will decrease Outcome: Progressing Continue with wound care.    Addendum:  Dressing change to midline incision done this evening.  Wound bed moist, beefy red.  Small serosang drainage.  Pt tolerated well.

## 2016-09-08 NOTE — Progress Notes (Signed)
PHARMACY - ADULT TOTAL PARENTERAL NUTRITION CONSULT NOTE   Pharmacy Consult for TPN Indication: Post-operative ileus   Patient Measurements: Ht 6'0", Wt 89.9 kg Height: 6' (182.9 cm) Weight: 189 lb 13.1 oz (86.1 kg) IBW/kg (Calculated) : 77.6  Recent Labs  09/06/16 0547 09/07/16 0527 09/08/16 0528  NA 134* 134* 135  K 4.4 4.5 4.7  CL 98* 96* 101  CO2 28 27 25   GLUCOSE 126* 130* 120*  BUN 34* 47* 49*  CREATININE 2.28* 2.45* 2.28*  CALCIUM 8.8* 9.0 8.8*  PHOS  --  4.8*  --   MG  --  2.1  --   ALBUMIN 2.2* 2.3*  --   ALKPHOS 235* 227*  --   AST 25 26  --   ALT 25 26  --   BILITOT 0.9 0.7  --   TRIG  --  143  --   PREALBUMIN  --  28.9  --    Insulin Requirements: 7 units of SSI in the past 24hr  Current Nutrition: advanced to regular diet 2/1 in hopes of improving food choices.  Reports modest PO intake, most at breakfast, meal intake recorded ~ 50%. - Family bringing Muscle Milk supplements (each provides 100 kcal and 20g protein)  - Magic cup TID with meals (each provides 290 kcal and 9 grams of protein)  IVF: NS at 132m/hr  Central access: 08/27/16: double-lumen CVC TPN start date: 08/28/16  ASSESSMENT                                                                                                          HPI: 76y/o M s/p laparoscopic resection of transverse colon and enterolysis of adhesions on 08/14/16.  Subsequently was found to have free air under diaphragm and was taken to OR on 08/27/16 for exploratory laparotomy, which found ischemic/infarcted R transverse colon, extensive intra-abdominal adhesions.  Patient underwent R colectomy with end ileostomy and Hartmann's pouch at L transverse colon, and lysis of adhesions x 90 minutes.  Orders received POD#1 to begin TPN with pharmacy assistance.    PMH is extensive and is outlined in the physician H&P from 08/27/16.  Problems of note from a nutrition standpoint include anemia, Vit B12 deficiency (takes 1000 mcg IM every 14  days, reportedly last taken 1/11), CKD III (baseline SCr ~ 1.4 to 1.8) with secondary hyperparathyroidism, enteric hyperoxaluria, gout, atrial fibrillation (on Eliquis PTA) hepatitis C (spontaneously cleared), LV dysfunction (EF 45-50% in 2015, normalized on subsequent echoes).  Significant events:  1/26  POD#1 - in ICU; requiring fluid boluses and phenylephrine drip for pressor support. Spoke with Dr. NLucia Gaskins- OK to reduce mIVF when TPN begins. 1/28 - PCCM s/o but remains ICU status for now 1/29 - Low grade fevers overnight; patient complaining of "fullness" and not wanting to eat much. Reduced TPN from 83 >> 60 ml/min in effort to stimulate appetite and reduce "fullness" from fluid overload 1/30 - intake with little/no improvement overnight; doesn't like full liquid diet options; pt asking surgery to advance to soft for better options  2/1 - continue TPN until taking po's well 2/2 - spoke with Dr. [name], ok to begin weaning TPN since patient is tolerating some regular diet 2/6 IVF increased per Dr. Jonnie Finner for AoCRF, worsened by hypovolemia from GI losses.  Dr. [name] wants to continue TPN despite PO intake; adding meds to slow ileostomy output.  Today, 09/08/2016:   Glucose - CBGs improved to goal of less than 150; no h/o DM  Electrolytes - improved to WNL  Renal - (h/o CKD III; baseline SCr ~ 1.4-1.8) BUN remains elevated, SCr improved.  Nephrology consulted - recommend increase IVF d/t GI losses and hypotension.  Hepatic - Albumin low; Alk phos elevated but decreasing (TPN related?); Tili back to WNL; Transaminases WNL.  TGs -  WNL, 85 (1/27), 94 (1/29), 143 (2/5)  Prealbumin - 10/(1/27), dropped to 5.8 (1/29), 28.9 (2/5)   NUTRITIONAL GOALS                                                                                             RD recs (updated 2/2): Kcal:  9480-1655 (25-28 kcal/kg); Protein: 86-103 grams (1-1.2 grams/kg) Clinimix E 5/20 at a goal rate of 83 ml/hr + 20% fat  emulsion at 258m/day to provide: 100 g/day protein, 2233 Kcal/day.  +++ Please note that due to national shortage, the Clinimix formulas available to [place] limited and can change on a daily basis +++  PLAN                                                                                                                       At 1800 tonight:  Continue Clinmix-E 5/20 at 40 ml/hr.   Continue IV lipid emulsion at 10 ml/hr x 12 hr.   Trace elements are also on national shortage. Patient is ordered a daily multivitamin which contains an adequate amount of trace elements.  Continue resistant scale ACHS  IV fluids changed to 125 ml/hr per nephrology.  TPN labs Mondays and Thursdays.  F/u tolerance of regular diet and potential to d/c TPN    CGretta ArabPharmD, BCPS Pager [phone]/01/2017 8:13 AM

## 2016-09-08 NOTE — Care Management Note (Signed)
Case Management Note  Patient Details  Name: Derek [name] MRN: [id number] Date of Birth: 1940/08/22  Subjective/Objective:     1/12 Lap resection of colon, Bowel Perforation, 1/25 Exploratory lap, R colectomy and Ileostomy (found CA)              Action/Plan: Plan to dc home with wife and [place]   Expected Discharge Date:  09/13/2016               Expected Discharge Plan:  [place]  In-House Referral:  NA  Discharge planning Services  CM Consult  Post Acute Care Choice:  Home Health Choice offered to:     DME Arranged:  Hospital bed DME Agency:  [place] Arranged:  RN, PT [facility] Agency:     Status of Service:  In process, will continue to follow  If discussed at Long Length of Stay Meetings, dates discussed:    Additional CommentsPurcell Mouton, RN 09/08/2016, 3:12 PM

## 2016-09-08 NOTE — Consult Note (Addendum)
[place] Nurse ostomy follow up Stoma type/location: RLQ ileostomy Stomal assessment/size: 1 and 3/8 inches oval, red, moist with nearly skin level from 4-6 o'clock Peristomal assessment: Contact dermatitis from 4-6 o'clock Treatment options for stomal/peristomal skin: dusting with pectin powder, resizing of pouch aperture, skin barrier ring to fit oval stoma Output: light brown effluent, some thickening Ostomy pouching: 1pc. Convex with skin barrier ring.  Pouch size changed today to 1 and 1/2 inch convex (from 2 inch convex) Education provided: Elongated session with wife as she learns pouch management skills.  She removed pouch today, cleansed skin.  Observed me measuring stoma only because ostomy was functioning during sizing.  Wife traced and cut pattern onto new pouch. Observed the dusting of pectin powder onto the area of peristomal skin irritation, also the placement of the skin barrier ring. Wife placed pouch over ring and sealed bottom of pouch. Is independent in emptying, but tired of the frequency that this must be performed and is wondering how it is going to be possible at home/when her husband will be able to help, be independent.  Emotional support provided as well as inquiring about others who can off-load her from a person care perspective for her husband. Enrolled patient in [place] Start Discharge program: Yes [place] nursing team will follow, and will remain available to this patient, the nursing, surgical and medical teams.  Thanks, Maudie Flakes, MSN, RN, [place], Arther Abbott  Pager# [phone]

## 2016-09-08 NOTE — Progress Notes (Signed)
[facility] Progress Note   Subjective: good UOP, creat down 2.2, 2.3 L of GI losses  Vitals:   09/07/16 0637 09/07/16 1435 09/07/16 2044 09/08/16 0455  BP: (!) 96/56 98/63 99/64  111/62  Pulse: 75 72 74 71  Resp: 20 20 19  (!) 22  Temp: 98.2 F (36.8 C) 97.2 F (36.2 C) 97.3 F (36.3 C) 97.5 F (36.4 C)  TempSrc: Oral Axillary Oral Oral  SpO2: 96% 96% 99% 95%  Weight:      Height:        Inpatient medications: . amiodarone  200 mg Oral BID  . cycloSPORINE  1 drop Both Eyes BID  . febuxostat  80 mg Oral Daily  . gabapentin  100 mg Oral Daily   And  . gabapentin  200 mg Oral QHS  . heparin subcutaneous  5,000 Units Subcutaneous Q8H  . insulin aspart  0-20 Units Subcutaneous TID WC  . insulin aspart  0-5 Units Subcutaneous QHS  . multivitamin with minerals  1 tablet Oral Daily  . pantoprazole  40 mg Oral Q1200  . PARoxetine  20 mg Oral QHS  . polyvinyl alcohol  1 drop Both Eyes BID  . sodium chloride flush  3 mL Intravenous Q12H  . traZODone  150 mg Oral QHS   . Marland KitchenTPN (CLINIMIX-E) Adult 40 mL/hr at 09/07/16 1712  . sodium chloride 100 mL/hr at 09/08/16 0850   sodium chloride, acetaminophen, albuterol, calcium carbonate, diazepam, diazepam, fentaNYL (SUBLIMAZE) injection, guaiFENesin-dextromethorphan, HYDROcodone-acetaminophen, lip balm, ondansetron **OR** ondansetron (ZOFRAN) IV, ondansetron (ZOFRAN) IV, sodium chloride flush, sodium chloride flush  Exam: Gen alert, no distress, dry mouth No rash, cyanosis or gangrene Sclera anicteric, throat clear  No jvd or bruits Chest clear bilat RRR no MRG Abd large ileostomy bag w brownish liquid, +bs, no ascites GU normal male MS no joint effusions or deformity Ext no LE or UE edema / no wounds or ulcers Neuro is alert, Ox 3 , nf  UA 1/28 - negative  Assessment: 1. Acute on CRF - due to hypovolemia from GI losses/ hypotension / hypoperfusion.  GI losses over 2 L / day.  Not sure if there is anyway to  decrease this?  Will increase fluids further > TPN 40/hr + NS 125/ hr.  Will follow.   2. CKD baseline creat 1.4- 1.8, f/b Dr Deterding at [facility] 3. SP R colectomy/ infarcted bowel 4. Hx colon Ca - sp transverse colectomy Jan 2018 5. Gout 6. HTN - holding all meds, BP's soft   Plan - as above   Kelly Splinter MD [facility] pager [phone]   09/08/2016, 9:44 AM    Recent Labs Lab 09/03/16 0359 09/04/16 0514  09/06/16 0547 09/07/16 0527 09/08/16 0528  NA 136 133*  < > 134* 134* 135  K 3.8 4.1  < > 4.4 4.5 4.7  CL 98* 94*  < > 98* 96* 101  CO2 29 29  < > 28 27 25   GLUCOSE 137* 138*  < > 126* 130* 120*  BUN 23* 25*  < > 34* 47* 49*  CREATININE 1.69* 1.84*  < > 2.28* 2.45* 2.28*  CALCIUM 8.3* 8.4*  < > 8.8* 9.0 8.8*  PHOS 3.9 3.9  --   --  4.8*  --   < > = values in this interval not displayed.  Recent Labs Lab 09/05/16 0336 09/06/16 0547 09/07/16 0527  AST 25 25 26   ALT 25 25 26   ALKPHOS 244* 235* 227*  BILITOT 0.5 0.9 0.7  PROT 5.9* 6.1* 7.1  ALBUMIN 2.1* 2.2* 2.3*    Recent Labs Lab 09/03/16 0359 09/05/16 0336 09/07/16 0527  WBC 6.6 6.6 8.3  NEUTROABS 5.4 5.2 6.6  HGB 8.4* 8.4* 9.4*  HCT 25.1* 25.3* 29.8*  MCV 84.8 84.9 84.4  PLT 206 228 299   Iron/TIBC/Ferritin/ %Sat No results found for: IRON, TIBC, FERRITIN, IRONPCTSAT

## 2016-09-09 LAB — BASIC METABOLIC PANEL
Anion gap: 7 (ref 5–15)
BUN: 43 mg/dL — ABNORMAL HIGH (ref 6–20)
CALCIUM: 8.9 mg/dL (ref 8.9–10.3)
CHLORIDE: 104 mmol/L (ref 101–111)
CO2: 25 mmol/L (ref 22–32)
Creatinine, Ser: 1.99 mg/dL — ABNORMAL HIGH (ref 0.61–1.24)
GFR, EST AFRICAN AMERICAN: 37 mL/min — AB (ref >60)
GFR, EST NON AFRICAN AMERICAN: 32 mL/min — AB (ref >60)
Glucose, Bld: 106 mg/dL — ABNORMAL HIGH (ref 65–99)
Potassium: 5.4 mmol/L — ABNORMAL HIGH (ref 3.5–5.1)
SODIUM: 136 mmol/L (ref 135–145)

## 2016-09-09 LAB — GLUCOSE, CAPILLARY
GLUCOSE-CAPILLARY: 107 mg/dL — AB (ref 65–99)
GLUCOSE-CAPILLARY: 127 mg/dL — AB (ref 65–99)
Glucose-Capillary: 126 mg/dL — ABNORMAL HIGH (ref 65–99)
Glucose-Capillary: 130 mg/dL — ABNORMAL HIGH (ref 65–99)

## 2016-09-09 MED ORDER — FAT EMULSION 20 % IV EMUL
120.0000 mL | INTRAVENOUS | Status: AC
  Administered 2016-09-09: 120 mL via INTRAVENOUS
  Filled 2016-09-09: qty 250

## 2016-09-09 MED ORDER — ENSURE ENLIVE PO LIQD: 237.0000 mL | Freq: Two times a day (BID) | ORAL | Status: DC

## 2016-09-09 MED ORDER — ENSURE ENLIVE PO LIQD
237.0000 mL | Freq: Three times a day (TID) | ORAL | Status: DC
  Administered 2016-09-09 – 2016-09-10 (×2): 237 mL via ORAL

## 2016-09-09 MED ORDER — CLINIMIX/DEXTROSE (5/20) 5 % IV SOLN
INTRAVENOUS | Status: AC
  Administered 2016-09-09: 17:00:00 via INTRAVENOUS
  Filled 2016-09-09: qty 960

## 2016-09-09 NOTE — Progress Notes (Signed)
[facility] Progress Note   Subjective: good UOP, creat down 1.99  Vitals:   09/08/16 1500 09/08/16 2148 09/09/16 0551 09/09/16 1300  BP: 96/69 105/70 107/68 106/71  Pulse: 69 67 74 77  Resp: 18 18 18 20   Temp: 98.1 F (36.7 C) 97.6 F (36.4 C) 98.4 F (36.9 C) 98.1 F (36.7 C)  TempSrc: Oral Oral Oral Oral  SpO2: 98% 98% 95% 96%  Weight:   85.4 kg (188 lb 4.4 oz)   Height:        Inpatient medications: . amiodarone  200 mg Oral BID  . cycloSPORINE  1 drop Both Eyes BID  . febuxostat  80 mg Oral Daily  . feeding supplement (ENSURE ENLIVE)  237 mL Oral TID BM  . ferrous sulfate  325 mg Oral Daily  . gabapentin  100 mg Oral Daily   And  . gabapentin  200 mg Oral QHS  . heparin subcutaneous  5,000 Units Subcutaneous Q8H  . insulin aspart  0-20 Units Subcutaneous TID WC  . insulin aspart  0-5 Units Subcutaneous QHS  . loperamide  2 mg Oral BID  . multivitamin with minerals  1 tablet Oral Daily  . pantoprazole  40 mg Oral Q1200  . PARoxetine  20 mg Oral QHS  . polyvinyl alcohol  1 drop Both Eyes BID  . psyllium  1 packet Oral Daily  . sodium chloride flush  3 mL Intravenous Q12H  . traZODone  150 mg Oral QHS   . Marland KitchenTPN (CLINIMIX-E) Adult 40 mL/hr at 09/08/16 1747  . sodium chloride 125 mL/hr at 09/09/16 1011  . TPN (CLINIMIX) Adult without lytes     And  . fat emulsion     sodium chloride, acetaminophen, albuterol, calcium carbonate, diazepam, diazepam, fentaNYL (SUBLIMAZE) injection, guaiFENesin-dextromethorphan, HYDROcodone-acetaminophen, lip balm, ondansetron **OR** ondansetron (ZOFRAN) IV, ondansetron (ZOFRAN) IV, sodium chloride flush, sodium chloride flush  Exam: Gen alert, no distress, dry mouth No rash, cyanosis or gangrene Sclera anicteric, throat clear  No jvd or bruits Chest clear bilat RRR no MRG Abd large ileostomy bag w brownish liquid, +bs, no ascites GU normal male MS no joint effusions or deformity Ext no LE or UE edema / no  wounds or ulcers Neuro is alert, Ox 3 , nf  UA 1/28 - negative  Assessment: 1. Acute on CRF - due to hypovolemia from GI losses/ hypotension / hypoperfusion. Improved. Total output daily is around 3 L (urine/ GI), so 165 cc/hr total IVFs (4L day) input would include 1 L insensible losses.  Will continue IVF's.  2. CKD baseline creat 1.4- 1.8, f/b Dr Deterding at [facility] 3. SP R colectomy/ infarcted bowel 4. Hx colon Ca - sp transverse colectomy Jan 2018 5. Gout 6. HTN - holding all meds, BP's soft 7. Afib - in NSR, on amio only   Plan - as above   Kelly Splinter MD [facility] pager [phone]   09/09/2016, 3:54 PM    Recent Labs Lab 09/03/16 0359 09/04/16 0514  09/07/16 0527 09/08/16 0528 09/09/16 0704  NA 136 133*  < > 134* 135 136  K 3.8 4.1  < > 4.5 4.7 5.4*  CL 98* 94*  < > 96* 101 104  CO2 29 29  < > 27 25 25   GLUCOSE 137* 138*  < > 130* 120* 106*  BUN 23* 25*  < > 47* 49* 43*  CREATININE 1.69* 1.84*  < > 2.45* 2.28* 1.99*  CALCIUM 8.3* 8.4*  < >  9.0 8.8* 8.9  PHOS 3.9 3.9  --  4.8*  --   --   < > = values in this interval not displayed.  Recent Labs Lab 09/05/16 0336 09/06/16 0547 09/07/16 0527  AST 25 25 26   ALT 25 25 26   ALKPHOS 244* 235* 227*  BILITOT 0.5 0.9 0.7  PROT 5.9* 6.1* 7.1  ALBUMIN 2.1* 2.2* 2.3*    Recent Labs Lab 09/03/16 0359 09/05/16 0336 09/07/16 0527  WBC 6.6 6.6 8.3  NEUTROABS 5.4 5.2 6.6  HGB 8.4* 8.4* 9.4*  HCT 25.1* 25.3* 29.8*  MCV 84.8 84.9 84.4  PLT 206 228 299   Iron/TIBC/Ferritin/ %Sat No results found for: IRON, TIBC, FERRITIN, IRONPCTSAT

## 2016-09-09 NOTE — Progress Notes (Signed)
PHARMACY - ADULT TOTAL PARENTERAL NUTRITION CONSULT NOTE   Pharmacy Consult for TPN Indication: Post-operative ileus   Patient Measurements: Ht 6'0", Wt 89.9 kg Height: 6' (182.9 cm) Weight: 188 lb 4.4 oz (85.4 kg) IBW/kg (Calculated) : 77.6  Recent Labs  09/07/16 0527 09/08/16 0528 09/09/16 0704  NA 134* 135 136  K 4.5 4.7 5.4*  CL 96* 101 104  CO2 27 25 25   GLUCOSE 130* 120* 106*  BUN 47* 49* 43*  CREATININE 2.45* 2.28* 1.99*  CALCIUM 9.0 8.8* 8.9  PHOS 4.8*  --   --   MG 2.1  --   --   ALBUMIN 2.3*  --   --   ALKPHOS 227*  --   --   AST 26  --   --   ALT 26  --   --   BILITOT 0.7  --   --   TRIG 143  --   --   PREALBUMIN 28.9  --   --    Insulin Requirements: 10 units of SSI in the past 24hr  Current Nutrition: advanced to regular diet 2/1 in hopes of improving food choices.  Reports modest PO intake.  RN reports pt consumed ~25% of breakfast and reports nausea. - Family bringing Muscle Milk supplements (each provides 100 kcal and 20g protein)  - Magic cup TID with meals (each provides 290 kcal and 9 grams of protein)  IVF: NS at 123m/hr  Central access: 08/27/16: double-lumen CVC TPN start date: 08/28/16  ASSESSMENT                                                                                                          HPI: 76y/o M s/p laparoscopic resection of transverse colon and enterolysis of adhesions on 08/14/16.  Subsequently was found to have free air under diaphragm and was taken to OR on 08/27/16 for exploratory laparotomy, which found ischemic/infarcted R transverse colon, extensive intra-abdominal adhesions.  Patient underwent R colectomy with end ileostomy and Hartmann's pouch at L transverse colon, and lysis of adhesions x 90 minutes.  Orders received POD#1 to begin TPN with pharmacy assistance.    PMH is extensive and is outlined in the physician H&P from 08/27/16.  Problems of note from a nutrition standpoint include anemia, Vit B12 deficiency (takes  1000 mcg IM every 14 days, reportedly last taken 1/11), CKD III (baseline SCr ~ 1.4 to 1.8) with secondary hyperparathyroidism, enteric hyperoxaluria, gout, atrial fibrillation (on Eliquis PTA) hepatitis C (spontaneously cleared), LV dysfunction (EF 45-50% in 2015, normalized on subsequent echoes).  Significant events:  1/26  POD#1 - in ICU; requiring fluid boluses and phenylephrine drip for pressor support. Spoke with Dr. NLucia Gaskins- OK to reduce mIVF when TPN begins. 1/28 - PCCM s/o but remains ICU status for now 1/29 - Low grade fevers overnight; patient complaining of "fullness" and not wanting to eat much. Reduced TPN from 83 >> 60 ml/min in effort to stimulate appetite and reduce "fullness" from fluid overload 1/30 - intake with little/no improvement overnight; doesn't like full  liquid diet options; pt asking surgery to advance to soft for better options 2/1 - continue TPN until taking po's well 2/2 - spoke with Dr. [name], ok to begin weaning TPN since patient is tolerating some regular diet 2/6 IVF increased per Dr. Jonnie Finner for AoCRF, worsened by hypovolemia from GI losses.  Dr. [name] wants to continue TPN despite PO intake; adding meds (loperamide, ferrous sulfate) to slow ileostomy output.  Today, 09/09/2016:   Glucose - CBGs improved to goal of less than 150; no h/o DM  Electrolytes - K elevated at 5.4 (no extra IVF, supplements, or PO KCl), other elytes remain WNL  Renal - (h/o CKD III; baseline SCr ~ 1.4-1.8) BUN remains elevated, SCr improving.  Nephrology consulted - recommend increase IVF d/t GI losses and hypotension.  Hepatic - Albumin low; Alk phos elevated but decreasing (TPN related?); Tili back to WNL; Transaminases WNL.  TGs -  WNL, 85 (1/27), 94 (1/29), 143 (2/5)  Prealbumin - 10/(1/27), dropped to 5.8 (1/29), 28.9 (2/5)   NUTRITIONAL GOALS                                                                                             RD recs (updated 2/2): Kcal:   4982-6415 (25-28 kcal/kg); Protein: 86-103 grams (1-1.2 grams/kg) Clinimix E 5/20 at a goal rate of 83 ml/hr + 20% fat emulsion at 258m/day to provide: 100 g/day protein, 2233 Kcal/day.  +++ Please note that due to national shortage, the Clinimix formulas available to [place] limited and can change on a daily basis +++  PLAN                                                                                                                       At 1800 tonight:  Change to NON-E Clinmix 5/20 at 40 ml/hr.  Continue IV lipid emulsion at 10 ml/hr x 12 hr.   Trace elements are also on national shortage. Patient is ordered a daily multivitamin which contains an adequate amount of trace elements.  Continue resistant scale ACHS  IV fluids at 125 ml/hr per nephrology.  TPN labs Mondays and Thursdays.  F/u tolerance of regular diet and potential to d/c TPN    CGretta ArabPharmD, BCPS Pager [phone]/01/2017 7:53 AM

## 2016-09-09 NOTE — Progress Notes (Signed)
PT Cancellation Note  Patient Details Name: Derek [name] MRN: [id number] DOB: 16-Jul-1941   Cancelled Treatment:     Spouse reports pt is "having a bad day" with increased c/o fatigue.  Stated he amb yesterday the full unit.  Stated "he doesn't like to use the walker" but "he really needs to".  Spouse plans to take him home and asked for me to return in the morning.     Rica Koyanagi  PTA [facility]  Acute  Rehab Pager      [phone]

## 2016-09-09 NOTE — Progress Notes (Addendum)
Nutrition Follow-up  DOCUMENTATION CODES:   Not applicable  INTERVENTION:  TPN per Pharmacy. As patient is tolerating a regular diet now, recommend discontinuing TPN. If patient's PO intake does not improve recommend placing NG tube for enteral nutrition to help meet calorie and protein needs.  Continue Muscle Milk daily from home, each supplement provides 100 kcal and 20 grams of protein. Patient reports he can drink 2 daily.  Provide Ensure Enlive po TID between meals, each supplement provides 350 kcal and 20 grams of protein. Patient prefers strawberry.  Continue Magic Cup BID with lunch and dinner, each supplement provides 290 kcal and 9 grams of protein.  RD provided "Ileostomy Nutrition Therapy" handout from the Academy of [employer] and reviewed with patient and wife. Encouraged patient to eat low-fiber foods that will be well-tolerated. Encouraged intake of adequate calories and protein through meals, snacks, and oral nutrition supplements. Discussed recommendation to eat small meals every 2-4 hours.  NUTRITION DIAGNOSIS:   Inadequate oral intake related to inability to eat as evidenced by NPO status.  Patient tolerating regular diet but intake remains inadequate.  GOAL:   Patient will meet greater than or equal to 90% of their needs  Not met.  MONITOR:   PO intake, Supplement acceptance, Weight trends, Labs, I & O's, Other (Comment) (TPN regimen)  REASON FOR ASSESSMENT:   Consult New TPN/TNA  ASSESSMENT:   Pt with extensive history and s/p laparoscopic resection of transverse colon and enterolysis of adhesion for 2 hours on 08/14/16 by Dr. [name].  Post op he had some gout and got some steroids for this he was on a soft diet and ready for discharge on the 7th post op day.  He has developed a cough and was sent to the ED for evaluation after xray showed a significant amount of free air.  Pt still has abdominal pain.  He cannot lie down and sleeps in the  chair at home.  He started coughing and called his PCP yesterday.  He denies SOB and sats are good on RA here in the ED lying down off O2.  He has a productive cough and is uncomfortable with that.  He also has pain sitting up or lying down.  He was sent after the CXR showed free air under the diaphragm.   -Patient s/p exploratory laparotomy, lysis of adhesions, right colectomy with end ileostomy and Hartmann's pouch on 08/27/2016.   Spoke with patient and wife at bedside. Patient's appetite remains poor. He reports he does not feel hungry. Wife reports patient is afraid to eat. Patient reports he is experiencing some gagging/reflux and nausea.   Access: double-lumen CVC placed 08/27/2016  TPN: patient currently receiving Clinimix E 5/20 @ 40 ml/hr + 20% ILE @ 10 ml/hr over 12 hrs. This regimen provides 1084.8 kcal, 48 grams of protein, 1080 ml fluids daily. Per Surgery note today plan is to continue TPN. RD paged MD to discuss recommendation to discontinue TPN and initiate TF. Per Dr. [name] concern for initiating TF in setting of high-output ileostomy.   Meal Completion: 40-50% per chart. Patient did not have any Muscle Milk today or yesterday. He reports he did not eat lunch yesterday. For dinner last night patient had chocolate ice cream, pudding, and 25% of the [employer]. For breakfast today patient had 50% of his cereal in milk and a few bites of grits. In the past 24 hours patient has had approximately 523 kcal (24% minimum estimated kcal needs) and 13  grams of protein (15% minimum estimated protein needs).   Medications reviewed and include: ferrous sulfate 325 mg daily, Novolog sliding scale TID with meals and daily at bedtime, multivitamin with minerals daily, pantoprazole, NS @ 125 ml/hr.  Labs reviewed: CBG 107-155 past 24 hrs, Potassium 5.4, BUN 43, Creatinine 1.99.  Weight trend: 85.4 kg on 2/7 (-0.7 kg from admission weight of 86.1 kg, which is not a significant weight loss over 2  weeks)  Ileostomy output: 1.95 L from 0700 on 2/6 to 0700 on 2/7 per chart. Dr. [name] initiated Fe, metamucil, and imodium on 2/6 to slow ostomy output. Patient with 450 ml output documented since 0700 2/7 so ileostomy output likely decreasing.  Discussed with RN.   Diet Order:  Diet regular Room service appropriate? Yes; Fluid consistency: Thin .TPN (CLINIMIX-E) Adult TPN (CLINIMIX) Adult without lytes  Skin:  Wound (see comment) (Abdominal incision from surgery 1/25)  Last BM:  09/09/2016  Height:   Ht Readings from Last 1 Encounters:  08/28/16 6' (1.829 m)    Weight:   Wt Readings from Last 1 Encounters:  09/09/16 188 lb 4.4 oz (85.4 kg)    Ideal Body Weight:  80.91 kg  BMI:  Body mass index is 25.53 kg/m.  Estimated Nutritional Needs:   Kcal:  1610-9604 (25-28 kcal/kg)  Protein:  86-103 grams (1-1.2 grams/kg)  Fluid:  >/= 2 L/day  EDUCATION NEEDS:   No education needs identified at this time  Willey Blade, MS, RD, LDN Pager: [phone] After Hours Pager: [phone]

## 2016-09-09 NOTE — Progress Notes (Signed)
Pt's wife request a hospital bed.  Will need orders for hospital bed, HHRN,HHPT and face to face please.

## 2016-09-09 NOTE — Care Management Note (Signed)
Case Management Note  Patient Details  Name: Derek [name] MRN: [id number] Date of Birth: 18-Mar-1941  Subjective/Objective:                    Action/Plan:   Expected Discharge Date:   (unknown)               Expected Discharge Plan:  [place]  In-House Referral:  NA  Discharge planning Services  CM Consult  Post Acute Care Choice:  Home Health Choice offered to:     DME Arranged:  Hospital bed DME Agency:  [place] Arranged:  RN, PT [facility] Agency:   [place].   Status of Service:  In process, will continue to follow  If discussed at Long Length of Stay Meetings, dates discussed:    Additional CommentsPurcell Mouton, RN 09/09/2016, 2:23 PM

## 2016-09-09 NOTE — Progress Notes (Signed)
[place] [facility] Office:  [phone] General Surgery Progress Note   LOS: 13 days  POD -  13 Days Post-Op  Assessment/Plan: 1.  EXPLORATORYLAPAROTOMY, LYSIS OF ADHESIONS, ILEOSTOMY, RIGHT COLECTOMY with end ileostomy and Hartmann's pouch (at left transverse colon) - 08/27/2016 - D. Beatrix Breece  For ischemic/infarcted right transverse colon  Wound packed open - it is clean  On regular food.    Ileostomy 2,550 cc recorded last 24 hours - I started Fe, metamucil (soluble fiber) and imodium yesterday to try to slow down his ileostomy output.  He's had some nausea the last day ... Will hold these anti-diarrheal meds at current levels  2.  Open RESECTION TRANSVERSE COLON, Enterolysis of adhesions -  08/14/2016 - D. 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 Chronic renal insufficiency - appears to be secondary to volume contraction secondary to high output ileostomy.  Primary issue is managing his volume and output from his ileostomy  Creatinine - 2.45 - 09/07/2016   Creatinine - 2.28 - 09/08/2016  Today's labs pending  Followed by Dr. Lenna Sciara. Deterding q 3 months  Followed by Dr. Jonnie Finner in hospital  4. ATRIAL FIBRILLATION, CONTROLLED (I48.91) Followed by Dr. Aundra Dubin for cardiology On Amiodarone Was on apixaban 5 mg BID - last dose 08/26/2016 - hold anti-coagulation for now 5. ASCENDING AORTIC ANEURYSM (I71.2) Impression: Followed by Dr. Servando Snare  6. History of Crohn's disesase Prior ileocecectomy around 81. He required a second operation. He had a fistula that took months to heal. He did not have straight forward surgery. He has has some prior SBO secondary to adhesions from that surgery 7. History of PE 8. History of Hep C - from blood transfusion during his bowel resection in the 1970's 9. Nephrolithiasis 10. Post tramatic stress syndrome - from  service  On Paxil  11. Gout involving left knee  Restart Uloric  12.  DVT prophylaxis - on SQ Heparin 13.  Nutrition -   Prealbumin - 28 - 09/06/2016 (substantial improvement over last prealbumin)  TPN - on 40 cc/hour  Will keep on TPN until he is clearly taking po's well 14.  Anemia, acute blood loss  Hgb - 9.4 -09/07/2016    Active Problems:   Bowel perforation (HCC)   Colonic ischemia (HCC)   Pain of upper abdomen  Subjective:  PO intake remains poor.  Sleeping more.  About the same physically.  His wife is not in the room.  Objective:   Vitals:   09/08/16 2148 09/09/16 0551  BP: 105/70 107/68  Pulse: 67 74  Resp: 18 18  Temp: 97.6 F (36.4 C) 98.4 F (36.9 C)     Intake/Output from previous day:  02/06 0701 - 02/07 0700 In: 3639.5 [P.O.:462; I.V.:3177.5] Out: 2550 [Urine:600; Stool:1950]  Intake/Output this shift:  No intake/output data recorded.   Physical Exam:   General: WN WM who is alert and oriented.    HEENT: Normal. Pupils equal.  Has IJ in right neck. .   Lungs: Clearer the last 2 days.   Abdomen:  Ileostomy in RUQ  Measured output yesterday = 2,550 cc.   Wound: Clean - The wound looks good.  Dressing changes BID   Lab Results:     Recent Labs  09/07/16 0527  WBC 8.3  HGB 9.4*  HCT 29.8*  PLT 299    BMET    Recent Labs  09/07/16 0527 09/08/16 0528  NA 134* 135  K 4.5  4.7  CL 96* 101  CO2 27 25  GLUCOSE 130* 120*  BUN 47* 49*  CREATININE 2.45* 2.28*  CALCIUM 9.0 8.8*    PT/INR  No results for input(s): LABPROT, INR in the last 72 hours.  ABG  No results for input(s): PHART, HCO3 in the last 72 hours.  Invalid input(s): PCO2, PO2   Studies/Results:  No results found.   Anti-infectives:   Anti-infectives    Start     Dose/Rate Route Frequency Ordered Stop   09/02/16 1300  vancomycin (VANCOCIN) 1,250 mg in sodium chloride 0.9 % 250 mL IVPB  Status:  Discontinued     1,250 mg 166.7 mL/hr over 90 Minutes Intravenous Every  24 hours 09/02/16 1155 09/06/16 0921   08/28/16 0045  piperacillin-tazobactam (ZOSYN) IVPB 3.375 g  Status:  Discontinued     3.375 g 12.5 mL/hr over 240 Minutes Intravenous Every 8 hours 08/28/16 0033 08/28/16 0038   08/28/16 0034  piperacillin-tazobactam (ZOSYN) IVPB 3.375 g  Status:  Discontinued     3.375 g 12.5 mL/hr over 240 Minutes Intravenous Every 8 hours 08/28/16 0034 09/06/16 0921   08/28/16 0015  vancomycin (VANCOCIN) IVPB 750 mg/150 ml premix  Status:  Discontinued     750 mg 150 mL/hr over 60 Minutes Intravenous Every 12 hours 08/28/16 0001 08/29/16 1454   08/28/16 0000  fluconazole (DIFLUCAN) IVPB 400 mg  Status:  Discontinued     400 mg 100 mL/hr over 120 Minutes Intravenous Every 24 hours 08/28/16 0000 08/30/16 0950   08/27/16 1715  piperacillin-tazobactam (ZOSYN) IVPB 3.375 g     3.375 g 100 mL/hr over 30 Minutes Intravenous  Once 08/27/16 1709 08/27/16 1631      [name], MD, FACS Pager: [place] [facility] Office: [phone] 09/09/2016

## 2016-09-10 LAB — GLUCOSE, CAPILLARY
GLUCOSE-CAPILLARY: 116 mg/dL — AB (ref 65–99)
Glucose-Capillary: 122 mg/dL — ABNORMAL HIGH (ref 65–99)
Glucose-Capillary: 109 mg/dL — ABNORMAL HIGH (ref 65–99)
Glucose-Capillary: 109 mg/dL — ABNORMAL HIGH (ref 65–99)

## 2016-09-10 LAB — COMPREHENSIVE METABOLIC PANEL
ALT: 21 U/L (ref 17–63)
AST: 24 U/L (ref 15–41)
Albumin: 2.3 g/dL — ABNORMAL LOW (ref 3.5–5.0)
Alkaline Phosphatase: 179 U/L — ABNORMAL HIGH (ref 38–126)
Anion gap: 7 (ref 5–15)
BUN: 34 mg/dL — AB (ref 6–20)
CHLORIDE: 106 mmol/L (ref 101–111)
CO2: 23 mmol/L (ref 22–32)
CREATININE: 1.87 mg/dL — AB (ref 0.61–1.24)
Calcium: 8.8 mg/dL — ABNORMAL LOW (ref 8.9–10.3)
GFR calc Af Amer: 39 mL/min — ABNORMAL LOW (ref >60)
GFR, EST NON AFRICAN AMERICAN: 34 mL/min — AB (ref >60)
Glucose, Bld: 118 mg/dL — ABNORMAL HIGH (ref 65–99)
POTASSIUM: 4.7 mmol/L (ref 3.5–5.1)
SODIUM: 136 mmol/L (ref 135–145)
Total Bilirubin: 0.6 mg/dL (ref 0.3–1.2)
Total Protein: 6.2 g/dL — ABNORMAL LOW (ref 6.5–8.1)

## 2016-09-10 LAB — CBC WITH DIFFERENTIAL/PLATELET
BASOS ABS: 0 10*3/uL (ref 0.0–0.1)
BASOS PCT: 0 %
Eosinophils Absolute: 0.2 10*3/uL (ref 0.0–0.7)
Eosinophils Relative: 2 %
HEMATOCRIT: 28.1 % — AB (ref 39.0–52.0)
HEMOGLOBIN: 9 g/dL — AB (ref 13.0–17.0)
LYMPHS PCT: 7 %
Lymphs Abs: 0.7 10*3/uL (ref 0.7–4.0)
MCH: 27.9 pg (ref 26.0–34.0)
MCHC: 32 g/dL (ref 30.0–36.0)
MCV: 87 fL (ref 78.0–100.0)
MONOS PCT: 8 %
Monocytes Absolute: 0.7 10*3/uL (ref 0.1–1.0)
NEUTROS ABS: 7.5 10*3/uL (ref 1.7–7.7)
Neutrophils Relative %: 83 %
Platelets: 264 10*3/uL (ref 150–400)
RBC: 3.23 MIL/uL — ABNORMAL LOW (ref 4.22–5.81)
RDW: 15.6 % — ABNORMAL HIGH (ref 11.5–15.5)
WBC: 9.1 10*3/uL (ref 4.0–10.5)

## 2016-09-10 LAB — PHOSPHORUS: PHOSPHORUS: 3.6 mg/dL (ref 2.5–4.6)

## 2016-09-10 LAB — MAGNESIUM: MAGNESIUM: 1.7 mg/dL (ref 1.7–2.4)

## 2016-09-10 MED ORDER — PSYLLIUM 95 % PO PACK
1.0000 | PACK | Freq: Two times a day (BID) | ORAL | Status: DC
Start: 2016-09-10 — End: 2016-09-14
  Administered 2016-09-10 – 2016-09-14 (×9): 1 via ORAL
  Filled 2016-09-10 (×9): qty 1

## 2016-09-10 MED ORDER — LOPERAMIDE HCL 2 MG PO CAPS
4.0000 mg | ORAL_CAPSULE | Freq: Two times a day (BID) | ORAL | Status: DC
  Administered 2016-09-10 – 2016-09-14 (×9): 4 mg via ORAL
  Filled 2016-09-10 (×9): qty 2

## 2016-09-10 MED ORDER — CLINIMIX/DEXTROSE (5/20) 5 % IV SOLN
INTRAVENOUS | Status: AC
  Administered 2016-09-10: 18:00:00 via INTRAVENOUS
  Filled 2016-09-10: qty 960

## 2016-09-10 MED ORDER — FAT EMULSION 20 % IV EMUL
120.0000 mL | INTRAVENOUS | Status: AC
  Administered 2016-09-10: 120 mL via INTRAVENOUS
  Filled 2016-09-10: qty 200

## 2016-09-10 NOTE — Progress Notes (Signed)
PHARMACY - ADULT TOTAL PARENTERAL NUTRITION CONSULT NOTE   Pharmacy Consult for TPN Indication: Post-operative ileus   Patient Measurements: Ht 6'0", Wt 89.9 kg Height: 6' (182.9 cm) Weight: 188 lb 4.4 oz (85.4 kg) IBW/kg (Calculated) : 77.6  Recent Labs  09/09/16 0704 09/10/16 0553  NA 136 136  K 5.4* 4.7  CL 104 106  CO2 25 23  GLUCOSE 106* 118*  BUN 43* 34*  CREATININE 1.99* 1.87*  CALCIUM 8.9 8.8*  PHOS  --  3.6  MG  --  1.7  ALBUMIN  --  2.3*  ALKPHOS  --  179*  AST  --  24  ALT  --  21  BILITOT  --  0.6   Insulin Requirements: 9 units of SSI in the past 24hr  Current Nutrition: advanced to regular diet 2/1 in hopes of improving food choices.  Reports modest PO intake.  RN reports pt consumed ~25% of breakfast and reports nausea. - Family bringing Muscle Milk supplements (each provides 100 kcal and 20g protein)  - Magic cup TID with meals (each provides 290 kcal and 9 grams of protein)  IVF: NS at 80 ml/hr  Central access: 08/27/16: double-lumen CVC TPN start date: 08/28/16  ASSESSMENT                                                                                                          HPI: 76 y/o M s/p laparoscopic resection of transverse colon and enterolysis of adhesions on 08/14/16.  Subsequently was found to have free air under diaphragm and was taken to OR on 08/27/16 for exploratory laparotomy, which found ischemic/infarcted R transverse colon, extensive intra-abdominal adhesions.  Patient underwent R colectomy with end ileostomy and Hartmann's pouch at L transverse colon, and lysis of adhesions x 90 minutes.  Orders received POD#1 to begin TPN with pharmacy assistance.    PMH is extensive and is outlined in the physician H&P from 08/27/16.  Problems of note from a nutrition standpoint include anemia, Vit B12 deficiency (takes 1000 mcg IM every 14 days, reportedly last taken 1/11), CKD III (baseline SCr ~ 1.4 to 1.8) with secondary hyperparathyroidism, enteric  hyperoxaluria, gout, atrial fibrillation (on Eliquis PTA) hepatitis C (spontaneously cleared), LV dysfunction (EF 45-50% in 2015, normalized on subsequent echoes).  Significant events:  1/26  POD#1 - in ICU; requiring fluid boluses and phenylephrine drip for pressor support. Spoke with Dr. [name] - OK to reduce mIVF when TPN begins. 1/28 - PCCM s/o but remains ICU status for now 1/29 - Low grade fevers overnight; patient complaining of "fullness" and not wanting to eat much. Reduced TPN from 83 >> 60 ml/min in effort to stimulate appetite and reduce "fullness" from fluid overload 1/30 - intake with little/no improvement overnight; doesn't like full liquid diet options; pt asking surgery to advance to soft for better options 2/1 - continue TPN until taking po's well 2/2 - spoke with Dr. [name], ok to begin weaning TPN since patient is tolerating some regular diet 2/6 IVF increased per Dr. Jonnie Finner for AoCRF, worsened  by hypovolemia from GI losses.  Dr. [name] wants to continue TPN despite PO intake; adding meds (loperamide, ferrous sulfate) to slow ileostomy output. 2/8: continue TPN till po intake adequate  Today, 09/10/2016:   Glucose - CBGs improved to goal of less than 150; no h/o DM  Electrolytes - K+ wnl today, other electrolytes remain wnl  Renal - (h/o CKD III; baseline SCr ~ 1.4-1.8) BUN elevated-decreasing, SCr improving.  Nephrology consulted - managing IVF d/t GI losses and hypotension.  Hepatic - Albumin low; Alk phos elevated but decreasing (TPN related?); T bili back to WNL; Transaminases WNL.  TGs -  WNL, 85 (1/27), 94 (1/29), 143 (2/5)  Prealbumin - 10/(1/27), dropped to 5.8 (1/29), 28.9 (2/5)   NUTRITIONAL GOALS                                                                                             RD recs (updated 2/2): Kcal:  2694-8546 (25-28 kcal/kg); Protein: 86-103 grams (1-1.2 grams/kg) Clinimix E 5/20 at a goal rate of 83 ml/hr + 20% fat emulsion at 242m/day  to provide: 100 g/day protein, 2233 Kcal/day.  +++ Please note that due to national shortage, the Clinimix formulas available to [place] and can change on a daily basis +++  PLAN                                                                                                                       At 1800 tonight:  Continue NON-E Clinmix 5/20 at 40 ml/hr.  Continue IV lipid emulsion at 10 ml/hr x 12 hr.   Trace elements are also on national shortage. Patient is ordered a daily multivitamin which contains an adequate amount of trace elements.  Continue resistant scale ACHS  IV fluids reduced to 80 ml/hr per nephrology  TPN labs Mondays and Thursdays.  F/u tolerance of regular diet and potential to d/c TPN   GMinda DittoPharmD Pager [phone]/03/2017, 1:37 PM

## 2016-09-10 NOTE — Progress Notes (Signed)
[place] [facility] Office:  [phone] General Surgery Progress Note   LOS: 14 days  POD -  14 Days Post-Op  Assessment/Plan: 1.  EXPLORATORYLAPAROTOMY, LYSIS OF ADHESIONS, ILEOSTOMY, RIGHT COLECTOMY with end ileostomy and Hartmann's pouch (at left transverse colon) - 08/27/2016 - D. Jamond Neels  For ischemic/infarcted right transverse colon  Wound packed open - it is clean  On regular food.    Ileostomy output - 2,330 - 2/6, 1,950 - 2/7, 1,300 - 2/8  I started Fe, metamucil (soluble fiber) and imodium yesterday to try to slow down his ileostomy output.  Will increase metamucil and imodium.  ? Home with PICC line for IVF?  2.  Open RESECTION TRANSVERSE COLON, Enterolysis of adhesions -  08/14/2016 - D. Ricardo Schubach  For 1.2 cm adenocarcinoma of the right transverse colon, 0/7 nodes (T2, N0)  3. Chronic renal insufficiency -  Primary issue is managing his volume and output from his ileostomy  Creatinine - 2.45 - 09/07/2016   Creatinine - 2.28 - 09/08/2016  Creatinine - 1.87 - 09/20/2016  Followed by Dr. Lenna Sciara. Deterding q 3 months  Followed by Dr. Jonnie Finner in hospital  4. ATRIAL FIBRILLATION, CONTROLLED (I48.91) Followed by Dr. Aundra Dubin for cardiology On Amiodarone Was on apixaban 5 mg BID - last dose 08/26/2016 - hold anti-coagulation for now 5. ASCENDING AORTIC ANEURYSM (I71.2) Impression: Followed by Dr. Servando Snare  6. History of Crohn's disesase Prior ileocecectomy around 19. He required a second operation. He had a fistula that took months to heal. He did not have straight forward surgery. He has has some prior SBO secondary to adhesions from that surgery 7. History of PE 8. History of Hep C - from blood transfusion during his bowel resection in the 1970's 9. Nephrolithiasis 10. Post tramatic stress syndrome - from service  On Paxil  11. Gout involving left knee  Restart Uloric  12.  DVT  prophylaxis - on SQ Heparin 13.  Nutrition -   Prealbumin - 28 - 09/06/2016 (substantial improvement over last prealbumin)  TPN - on 40 cc/hour  Will keep on TPN until he is clearly taking po's well 14.  Anemia, acute blood loss  Hgb - 9.0 -09/10/2016    Active Problems:   Bowel perforation (HCC)   Colonic ischemia (HCC)   Pain of upper abdomen  Subjective:  Looks better.  Trying to eat more.  Feels better.  Just need to increase ambulation.  Wife in room.  I talked to her on the phone last PM.  Objective:   Vitals:   09/09/16 2134 09/10/16 0510  BP: 108/64 106/65  Pulse: 71 72  Resp: 20 16  Temp: 98.7 F (37.1 C) 98.3 F (36.8 C)     Intake/Output from previous day:  02/07 0701 - 02/08 0700 In: 2629 [P.O.:385; I.V.:2244] Out: 2525 [Urine:1225; Stool:1300]  Intake/Output this shift:  No intake/output data recorded.   Physical Exam:   General: WN WM who is alert and oriented.    HEENT: Normal. Pupils equal.  Has IJ in right neck. .   Lungs: Clear.   Abdomen:  Ileostomy in RUQ  Ileostomy measured output yesterday = 1,300 cc.   Wound: Clean - Dressing changes BID   Lab Results:     Recent Labs  09/10/16 0553  WBC 9.1  HGB 9.0*  HCT 28.1*  PLT 264    BMET    Recent Labs  09/09/16 0704 09/10/16 0553  NA 136 136  K 5.4* 4.7  CL 104 106  CO2 25 23  GLUCOSE 106* 118*  BUN 43* 34*  CREATININE 1.99* 1.87*  CALCIUM 8.9 8.8*    PT/INR  No results for input(s): LABPROT, INR in the last 72 hours.  ABG  No results for input(s): PHART, HCO3 in the last 72 hours.  Invalid input(s): PCO2, PO2   Studies/Results:  No results found.   Anti-infectives:   Anti-infectives    Start     Dose/Rate Route Frequency Ordered Stop   09/02/16 1300  vancomycin (VANCOCIN) 1,250 mg in sodium chloride 0.9 % 250 mL IVPB  Status:  Discontinued     1,250 mg 166.7 mL/hr over 90 Minutes Intravenous Every 24 hours 09/02/16 1155 09/06/16 0921   08/28/16 0045   piperacillin-tazobactam (ZOSYN) IVPB 3.375 g  Status:  Discontinued     3.375 g 12.5 mL/hr over 240 Minutes Intravenous Every 8 hours 08/28/16 0033 08/28/16 0038   08/28/16 0034  piperacillin-tazobactam (ZOSYN) IVPB 3.375 g  Status:  Discontinued     3.375 g 12.5 mL/hr over 240 Minutes Intravenous Every 8 hours 08/28/16 0034 09/06/16 0921   08/28/16 0015  vancomycin (VANCOCIN) IVPB 750 mg/150 ml premix  Status:  Discontinued     750 mg 150 mL/hr over 60 Minutes Intravenous Every 12 hours 08/28/16 0001 08/29/16 1454   08/28/16 0000  fluconazole (DIFLUCAN) IVPB 400 mg  Status:  Discontinued     400 mg 100 mL/hr over 120 Minutes Intravenous Every 24 hours 08/28/16 0000 08/30/16 0950   08/27/16 1715  piperacillin-tazobactam (ZOSYN) IVPB 3.375 g     3.375 g 100 mL/hr over 30 Minutes Intravenous  Once 08/27/16 1709 08/27/16 1631      [name], MD, FACS Pager: [place] [facility]: [phone] 09/10/2016

## 2016-09-10 NOTE — Progress Notes (Signed)
[facility] Progress Note   Subjective: good UOP, creat down 1.8  Vitals:   09/09/16 1300 09/09/16 2134 09/10/16 0500 09/10/16 0510  BP: 106/71 108/64  106/65  Pulse: 77 71  72  Resp: 20 20  16   Temp: 98.1 F (36.7 C) 98.7 F (37.1 C)  98.3 F (36.8 C)  TempSrc: Oral Oral  Oral  SpO2: 96% 96%  96%  Weight:   85.4 kg (188 lb 4.4 oz)   Height:        Inpatient medications: . amiodarone  200 mg Oral BID  . cycloSPORINE  1 drop Both Eyes BID  . febuxostat  80 mg Oral Daily  . feeding supplement (ENSURE ENLIVE)  237 mL Oral TID BM  . ferrous sulfate  325 mg Oral Daily  . gabapentin  100 mg Oral Daily   And  . gabapentin  200 mg Oral QHS  . heparin subcutaneous  5,000 Units Subcutaneous Q8H  . insulin aspart  0-20 Units Subcutaneous TID WC  . insulin aspart  0-5 Units Subcutaneous QHS  . loperamide  4 mg Oral BID  . multivitamin with minerals  1 tablet Oral Daily  . pantoprazole  40 mg Oral Q1200  . PARoxetine  20 mg Oral QHS  . polyvinyl alcohol  1 drop Both Eyes BID  . psyllium  1 packet Oral BID  . sodium chloride flush  3 mL Intravenous Q12H  . traZODone  150 mg Oral QHS   . sodium chloride 125 mL/hr at 09/09/16 1011  . TPN (CLINIMIX) Adult without lytes 40 mL/hr at 09/09/16 1707   sodium chloride, acetaminophen, albuterol, calcium carbonate, diazepam, diazepam, fentaNYL (SUBLIMAZE) injection, guaiFENesin-dextromethorphan, HYDROcodone-acetaminophen, lip balm, ondansetron **OR** ondansetron (ZOFRAN) IV, ondansetron (ZOFRAN) IV, sodium chloride flush, sodium chloride flush  Exam: Gen alert, no distress, dry mouth No rash, cyanosis or gangrene Sclera anicteric, throat clear  No jvd or bruits Chest clear bilat RRR no MRG Abd large ileostomy bag w brownish liquid, +bs, no ascites GU normal male MS no joint effusions or deformity Ext no LE or UE edema / no wounds or ulcers Neuro is alert, Ox 3 , nf  UA 1/28 - negative  Assessment: 1. Acute on  CRF - due to hypovolemia from GI losses. Resolving.  Would keep total IVF about 500 cc/day above his total daily GI/ GU losses for now. Creat back towards baseline. Avoid any nsaids/ contrast/ ACEi / ARB.  No other suggestions. Will sign off.  2. CKD baseline creat 1.4- 1.8, f/b Dr Deterding at [facility] 3. SP R colectomy/ infarcted bowel 4. Hx colon Ca - sp transverse colectomy Jan 2018 5. Gout 6. HTN - holding all meds, BP's soft 7. Afib - in NSR, on amio only   Plan - as above   Kelly Splinter MD [facility] pager [phone]   09/10/2016, 10:38 AM    Recent Labs Lab 09/04/16 0514  09/07/16 0527 09/08/16 0528 09/09/16 0704 09/10/16 0553  NA 133*  < > 134* 135 136 136  K 4.1  < > 4.5 4.7 5.4* 4.7  CL 94*  < > 96* 101 104 106  CO2 29  < > 27 25 25 23   GLUCOSE 138*  < > 130* 120* 106* 118*  BUN 25*  < > 47* 49* 43* 34*  CREATININE 1.84*  < > 2.45* 2.28* 1.99* 1.87*  CALCIUM 8.4*  < > 9.0 8.8* 8.9 8.8*  PHOS 3.9  --  4.8*  --   --  3.6  < > = values in this interval not displayed.  Recent Labs Lab 09/06/16 0547 09/07/16 0527 09/10/16 0553  AST 25 26 24   ALT 25 26 21   ALKPHOS 235* 227* 179*  BILITOT 0.9 0.7 0.6  PROT 6.1* 7.1 6.2*  ALBUMIN 2.2* 2.3* 2.3*    Recent Labs Lab 09/05/16 0336 09/07/16 0527 09/10/16 0553  WBC 6.6 8.3 9.1  NEUTROABS 5.2 6.6 7.5  HGB 8.4* 9.4* 9.0*  HCT 25.3* 29.8* 28.1*  MCV 84.9 84.4 87.0  PLT 228 299 264   Iron/TIBC/Ferritin/ %Sat No results found for: IRON, TIBC, FERRITIN, IRONPCTSAT

## 2016-09-10 NOTE — Consult Note (Signed)
[facility] Nurse ostomy follow up Stoma type/location: RLQ ileostomy Stomal assessment/size: 1 and 3/8 inch oval, visualized through transparent pouch.  Wife reports that pouch leaked last night (early this morning) and believes it was due to over-filling. Peristomal assessment: Not seen today Treatment options for stomal/peristomal skin: Skin barrier ring Output: thickening light brown effluent Ostomy pouching: 1pc.convex with skin barrier ring  Education provided: Wife is today provided with full page teaching sheets on Dietary Guidelines for Ileostomy and Tips for Hi-Output Ileostomies. Specific instruction is provided pertaining to her husband's past history with obstructions due to adhesions and the signs and symptoms of blockage as well as some tips to release blockages in the early stages such as taking a warm bath and massaging around the ostomy. She is instructed that no output from the ostomy for several hours, distension and discomfort is a reportable condition to the MD.  She is provided a 1-page teaching sheet for the one-piece pouching system. Patient and wife have my contact information for Resource post discharge.  They are to have [facility] for continued support and reinforcement of education on the ileostomy as well as for wound and PICC line care. Enrolled patient in [place] Start Discharge program: Yes.  Patient has already received box at home. [facility] nursing team will follow, and will remain available to this patient, the nursing, surgical and medical teams.   Thanks, Maudie Flakes, MSN, RN, [place], [facility]  Pager# [phone]

## 2016-09-10 NOTE — Progress Notes (Signed)
Physical Therapy Treatment Patient Details Name: Derek [name] MRN: [id number] DOB: 02-24-1941 Today's Date: 09/10/2016    History of Present Illness HPI: Derek [name] is a 76 y.o. male with  history colon poylps, AAA,I, Crohn's disease, B12 deficiency, GERD, Gout, Hep C, PE, chronic thrombocytopenia,  PTSD, Lung nodule, HTN, RLS , on 08/14/16 admitted  for exploratory lap with lysis of adhesions, of adhesions, right colectomy. On 08/27/16  CT scan of the abdomen in the emergency department was consistent with breakdown of recent surgical anastomosis with the spillage of both air and apparent fecal matter as well as contrast of the peritoneum. He was taken urgently to the operating room for repair    PT Comments    Pt feeling better today.  Assisted OOB to amb a greater distance using a walker this session as therapist insisted.   Much improved stability with AD.    Follow Up Recommendations  Home health PT     Equipment Recommendations  Rolling walker with 5" wheels    Recommendations for Other Services       Precautions / Restrictions Precautions Precautions: Fall Precaution Comments: colostomy  Restrictions Weight Bearing Restrictions: No    Mobility  Bed Mobility Overal bed mobility: Needs Assistance Bed Mobility: Sit to Supine;Supine to Sit     Supine to sit: Min guard Sit to supine: Min assist   General bed mobility comments: HOB elevated and increased time with Min Assist B LE's up onto bed when assisted back to bed  Transfers                    Ambulation/Gait Ambulation/Gait assistance: Min assist Ambulation Distance (Feet): 285 Feet Assistive device: Rolling walker (2 wheeled) (therapist strongly rec walker) Gait Pattern/deviations: Step-through pattern;Scissoring;Narrow base of support;Drifts right/left;Staggering left;Staggering right;Trunk flexed     General Gait Details: noted increased safety and gait stability using RW.  Pt does not  like to use a walker.     Stairs            Wheelchair Mobility    Modified Rankin (Stroke Patients Only)       Balance                                    Cognition Arousal/Alertness: Awake/alert Behavior During Therapy: WFL for tasks assessed/performed Overall Cognitive Status: Within Functional Limits for tasks assessed                      Exercises      General Comments        Pertinent Vitals/Pain Pain Assessment: No/denies pain    Home Living                      Prior Function            PT Goals (current goals can now be found in the care plan section) Progress towards PT goals: Progressing toward goals    Frequency    Min 3X/week      PT Plan Current plan remains appropriate    Co-evaluation             End of Session Equipment Utilized During Treatment: Gait belt Activity Tolerance: Patient tolerated treatment well Patient left: in bed;with call bell/phone within reach;with family/visitor present     Time: 0935-1000 PT Time Calculation (min) (ACUTE ONLY): 25 min  Charges:  $Gait Training: 8-22 mins $Therapeutic Activity: 8-22 mins                    G Codes:      Rica Koyanagi  PTA [facility]  Acute  Rehab Pager      [phone]

## 2016-09-11 LAB — BASIC METABOLIC PANEL
ANION GAP: 7 (ref 5–15)
BUN: 29 mg/dL — AB (ref 6–20)
CO2: 23 mmol/L (ref 22–32)
Calcium: 8.9 mg/dL (ref 8.9–10.3)
Chloride: 105 mmol/L (ref 101–111)
Creatinine, Ser: 1.62 mg/dL — ABNORMAL HIGH (ref 0.61–1.24)
GFR calc Af Amer: 47 mL/min — ABNORMAL LOW (ref >60)
GFR, EST NON AFRICAN AMERICAN: 40 mL/min — AB (ref >60)
Glucose, Bld: 140 mg/dL — ABNORMAL HIGH (ref 65–99)
POTASSIUM: 4.1 mmol/L (ref 3.5–5.1)
SODIUM: 135 mmol/L (ref 135–145)

## 2016-09-11 LAB — GLUCOSE, CAPILLARY
GLUCOSE-CAPILLARY: 102 mg/dL — AB (ref 65–99)
GLUCOSE-CAPILLARY: 152 mg/dL — AB (ref 65–99)
Glucose-Capillary: 132 mg/dL — ABNORMAL HIGH (ref 65–99)
Glucose-Capillary: 119 mg/dL — ABNORMAL HIGH (ref 65–99)

## 2016-09-11 MED ORDER — ENSURE ENLIVE PO LIQD: 237.0000 mL | Freq: Two times a day (BID) | ORAL | Status: DC | PRN

## 2016-09-11 MED ORDER — FERROUS SULFATE 325 (65 FE) MG PO TABS
325.0000 mg | ORAL_TABLET | Freq: Two times a day (BID) | ORAL | Status: DC
  Administered 2016-09-11 – 2016-09-14 (×5): 325 mg via ORAL
  Filled 2016-09-11 (×5): qty 1

## 2016-09-11 NOTE — Progress Notes (Signed)
PHARMACY - ADULT TOTAL PARENTERAL NUTRITION CONSULT NOTE   Pharmacy Consult for TPN Indication: Post-operative ileus   Patient Measurements: Ht 6'0", Wt 89.9 kg Height: 6' (182.9 cm) Weight: 190 lb 4.1 oz (86.3 kg) IBW/kg (Calculated) : 77.6  Recent Labs  09/10/16 0553 09/11/16 0845  NA 136 135  K 4.7 4.1  CL 106 105  CO2 23 23  GLUCOSE 118* 140*  BUN 34* 29*  CREATININE 1.87* 1.62*  CALCIUM 8.8* 8.9  PHOS 3.6  --   MG 1.7  --   ALBUMIN 2.3*  --   ALKPHOS 179*  --   AST 24  --   ALT 21  --   BILITOT 0.6  --    Insulin Requirements: 3 units of SSI in the past 24hr  Current Nutrition: advanced to regular diet 2/1 in hopes of improving food choices.  Reports modest PO intake.  RN reports pt consumed ~40-75% of meals. - Family bringing Muscle Milk supplements (each provides 100 kcal and 20g protein)  - Ensure TID   IVF: NS at 80 ml/hr  Central access: 08/27/16: double-lumen CVC TPN start date: 08/28/16  ASSESSMENT                                                                                                          HPI: 76 y/o M s/p laparoscopic resection of transverse colon and enterolysis of adhesions on 08/14/16.  Subsequently was found to have free air under diaphragm and was taken to OR on 08/27/16 for exploratory laparotomy, which found ischemic/infarcted R transverse colon, extensive intra-abdominal adhesions.  Patient underwent R colectomy with end ileostomy and Hartmann's pouch at L transverse colon, and lysis of adhesions x 90 minutes.  Orders received POD#1 to begin TPN with pharmacy assistance.    PMH is extensive and is outlined in the physician H&P from 08/27/16.  Problems of note from a nutrition standpoint include anemia, Vit B12 deficiency (takes 1000 mcg IM every 14 days, reportedly last taken 1/11), CKD III (baseline SCr ~ 1.4 to 1.8) with secondary hyperparathyroidism, enteric hyperoxaluria, gout, atrial fibrillation (on Eliquis PTA) hepatitis C  (spontaneously cleared), LV dysfunction (EF 45-50% in 2015, normalized on subsequent echoes).  Significant events:  1/26  POD#1 - in ICU; requiring fluid boluses and phenylephrine drip for pressor support. Spoke with Dr. [name] - OK to reduce mIVF when TPN begins. 1/28 - PCCM s/o but remains ICU status for now 1/29 - Low grade fevers overnight; patient complaining of "fullness" and not wanting to eat much. Reduced TPN from 83 >> 60 ml/min in effort to stimulate appetite and reduce "fullness" from fluid overload 1/30 - intake with little/no improvement overnight; doesn't like full liquid diet options; pt asking surgery to advance to soft for better options 2/1 - continue TPN until taking po's well 2/2 - spoke with Dr. [name], ok to begin weaning TPN since patient is tolerating some regular diet 2/6 IVF increased per Dr. Jonnie Finner for AoCRF, worsened by hypovolemia from GI losses.  Dr. [name] wants to continue TPN despite PO  intake; adding meds (loperamide, psyllium, ferrous sulfate) to slow ileostomy output.   Today, 09/11/2016:   Glucose - CBGs improved to goal of less than 150; no h/o DM  Electrolytes - stable, WNL  Renal - (h/o CKD III; baseline SCr ~ 1.4-1.8) BUN and SCr elevated but decreasing.    Nephrology consulted - recommend total IVF ~500 mL/day above his total daily GI/ GU losses   Hepatic - Albumin low; Alk phos elevated but decreasing (TPN related?); T bili back to WNL; Transaminases WNL.  TGs -  WNL, 85 (1/27), 94 (1/29), 143 (2/5)  Prealbumin - 10/(1/27), dropped to 5.8 (1/29), 28.9 (2/5)   NUTRITIONAL GOALS                                                                                             RD recs (updated 2/2): Kcal:  1833-5825 (25-28 kcal/kg); Protein: 86-103 grams (1-1.2 grams/kg) Clinimix E 5/20 at a goal rate of 83 ml/hr + 20% fat emulsion at 233m/day to provide: 100 g/day protein, 2233 Kcal/day.  +++ Please note that due to national shortage, the Clinimix  formulas available to [place] limited and can change on a daily basis +++  PLAN                                                                                                                       At 1800 tonight:  Stop TPN after current bag expires at 1800  Continue daily multivitamin  Continue sliding scale insulin ACHS  Continue IV fluids at 80 ml/hr per CCS  D/C TPN labs  CGretta ArabPharmD, BCPS Pager [phone]/04/2017 9:34 AM

## 2016-09-11 NOTE — Progress Notes (Signed)
PT Cancellation Note  Patient Details Name: Derek [name] MRN: [id number] DOB: Jan 25, 1941   Cancelled Treatment:     pt amb twice today with nursing and spouse using a rolling walker.  Currently resing.  Spouse states, "he does better in the mornings".  Plans are to D/C back home.   PT rec HHPT and RW   Rica Koyanagi  PTA [facility]  Acute  Rehab Pager      [phone]

## 2016-09-11 NOTE — Care Management Important Message (Signed)
Important Message  Patient Details  Name: Derek [name] MRN: [id number] Date of Birth: [birth year]   Medicare Important Message Given:  Yes    Kerin Salen 09/11/2016, 12:54 [place] Message  Patient Details  Name: Derek [name] MRN: [id number] Date of Birth: 1940/09/30   Medicare Important Message Given:  Yes    Kerin Salen 09/11/2016, 12:54 PM

## 2016-09-11 NOTE — Progress Notes (Signed)
Nutrition Follow-up  DOCUMENTATION CODES:   Not applicable  INTERVENTION:  - Will d/c Magic Cup per pt request. - Will change Ensure Enlive order to BID PRN. - Continue to encourage PO intakes of meals, beverages, Muscle Milk and protein bars provided by wife. - Education provided on appropriate protein sources and tips; reviewed handout provided by RD on 2/7.  NUTRITION DIAGNOSIS:   Inadequate oral intake related to poor appetite as evidenced by per patient/family report. -ongoing  GOAL:   Patient will meet greater than or equal to 90% of their needs -met at this time.  MONITOR:   PO intake, Supplement acceptance, Weight trends, Labs, I & O's  ASSESSMENT:   Pt with extensive history and s/p laparoscopic resection of transverse colon and enterolysis of adhesion for 2 hours on 08/14/16 by Dr. [name].  Post op he had some gout and got some steroids for this he was on a soft diet and ready for discharge on the 7th post op day.  He has developed a cough and was sent to the ED for evaluation after xray showed a significant amount of free air.  Pt still has abdominal pain.  He cannot lie down and sleeps in the chair at home.  He started coughing and called his PCP yesterday.  He denies SOB and sats are good on RA here in the ED lying down off O2.  He has a productive cough and is uncomfortable with that.  He also has pain sitting up or lying down.  He was sent after the CXR showed free air under the diaphragm.   2/9  Plan to d/c TPN this afternoon. Spoke with pt and wife, who is at bedside. Pt is very concerned about TPN stopping. Talked with pt about this and about needing to meet his needs PO, provided encouragement and positive feedback concerning this transition. Reviewed the handout on Ileostomy Nutrition Therapy provided by RD on 2/7. Also discussed protein options for home use and pt's dislike of oral nutrition supplements; adjustments outlined above based on this. Also dicussed slowly  incorporating fiber and that at this time pt should not consume more than 15 grams of fiber/day. Discussed the importance of oral hydration, especially with slow increase in fiber intake each week. Pt to receive PICC today which will be used for IVF administration at home. Dr. [name] had seen pt earlier this AM and encouraged 6-8 small meals daily. Weight stable over the past 2 days.   Medications reviewed; 325 mg oral ferrous sulfate BID, sliding scale Novolog, daily multivitamin with minerals, PRN Zofran, 40 mg oral Protonix/day. Labs reviewed; CBG: 102 mg/dL this AM, BUN: 29 mg/dL, creatinine: 1.62 mg/dL, GFR: 40 mL/min.  IVF: NS @ 80 mL/hr.    2/7 - Patient s/p exploratory laparotomy, lysis of adhesions, right colectomy with end ileostomy and Hartmann's pouch on 08/27/2016.  - Patient's appetite remains poor; reports he does not feel hungry.  - Wife reports patient is afraid to eat.  - Patient reports he is experiencing some gagging/reflux and nausea.  - TPN: patient currently receiving Clinimix E 5/20 @ 40 ml/hr + 20% ILE @ 10 ml/hr over 12 hrs. This regimen provides 1084.8 kcal, 48 grams of protein, 1080 ml fluids daily.  - Per Surgery note today plan is to continue TPN. RD paged MD to discuss recommendation to discontinue TPN and initiate TF.  - Per Dr. [name] concern for initiating TF in setting of high-output ileostomy.  - Meal Completion: 40-50% per chart.  -  Patient did not have any Muscle Milk today or yesterday.  - For dinner last night patient had chocolate ice cream, pudding, and 25% of the [employer]. - For breakfast today patient had 50% of his cereal in milk and a few bites of grits. In the past 24 hours patient has had approximately 523 kcal (24% minimum estimated kcal needs) and 13 grams of protein (15% minimum estimated protein needs).   Weight trend: 85.4 kg on 2/7 (-0.7 kg from admission weight of 86.1 kg, which is not a significant weight loss over 2 weeks)  Ileostomy  output: 1.95 L from 0700 on 2/6 to 0700 on 2/7 per chart. Dr. [name] initiated Fe, metamucil, and imodium on 2/6 to slow ostomy output. Patient with 450 ml output documented since 0700 2/7 so ileostomy output likely decreasing.   Diet Order:  Diet regular Room service appropriate? Yes; Fluid consistency: Thin TPN (CLINIMIX) Adult without lytes  Skin:  Wound (see comment) (Abdominal incision from surgery 1/25)  Last BM:  09/09/2016  Height:   Ht Readings from Last 1 Encounters:  08/28/16 6' (1.829 m)    Weight:   Wt Readings from Last 1 Encounters:  09/11/16 190 lb 4.1 oz (86.3 kg)    Ideal Body Weight:  80.91 kg  BMI:  Body mass index is 25.8 kg/m.  Estimated Nutritional Needs:   Kcal:  0569-7948 (25-28 kcal/kg)  Protein:  86-103 grams (1-1.2 grams/kg)  Fluid:  >/= 2 L/day  EDUCATION NEEDS:   No education needs identified at this time    Jarome Matin, MS, RD, LDN, CNSC Inpatient Clinical Dietitian Pager # [phone] After hours/weekend pager # [phone]

## 2016-09-11 NOTE — Progress Notes (Signed)
Per Lenell Antu, RN patient states that he would prefer to have PICC placed tomorrow as he is sleeping now.  Patient discharge is pending for 2-12.  Carolee Rota, RN VAST

## 2016-09-11 NOTE — Progress Notes (Signed)
[facility] Office:  [phone] General Surgery Progress Note   LOS: 15 days  POD -  15 Days Post-Op  Assessment/Plan: 1.  EXPLORATORYLAPAROTOMY, LYSIS OF ADHESIONS, ILEOSTOMY, RIGHT COLECTOMY with end ileostomy and Hartmann's pouch (at left transverse colon) - 08/27/2016 - D. Teri Legacy  For ischemic/infarcted right transverse colon  Wound packed open - it is clean  On regular food - which is he slowly doing better with  Ileostomy output - 2,330 - 2/6, 1,950 - 2/7, 1,300 - 2/8, 2,175 - 2/9  I started Fe, metamucil (soluble fiber) and imodium to try to slow down his ileostomy output.  This may be helping a lttle.  Will need home IVF.  I spoke with Dr. Melvia Heaps, who thinks he is low risk for renal failure, and a PICC would be okay (in comparison to a tunneled catheter).  Targeting to go home Monday, 2/12.  I have put in orders for [facility] for IVF, wound care, and home PT.  2.  Open RESECTION TRANSVERSE COLON, Enterolysis of adhesions -  08/14/2016 - D. 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 Chronic renal insufficiency -  Primary issue is managing his volume and output from his ileostomy  Creatinine - 2.45 - 09/07/2016   Creatinine - 2.28 - 09/08/2016  Creatinine - 1.62 - 09/11/2016  Followed by Dr. Lenna Sciara. Deterding q 3 months  Followed by Dr. Jonnie Finner in hospital - who has now signed off  4. ATRIAL FIBRILLATION, CONTROLLED (I48.91) Followed by Dr. Aundra Dubin for cardiology On Amiodarone Was on apixaban 5 mg BID - last dose 08/26/2016 - hold anti-coagulation for now 5. ASCENDING AORTIC ANEURYSM (I71.2) Impression: Followed by Dr. Servando Snare  6. History of Crohn's disesase Prior ileocecectomy around 23. He required a second operation. He had a fistula that took months to heal. He did not have straight forward surgery. He has has some prior SBO secondary to  adhesions from that surgery 7. History of PE 8. History of Hep C - from blood transfusion during his bowel resection in the 1970's 9. Nephrolithiasis 10. Post tramatic stress syndrome - from service  On Paxil  11. Gout involving left knee  Restart Uloric  12.  DVT prophylaxis - on SQ Heparin 13.  Nutrition -   Prealbumin - 28 - 09/06/2016 (substantial improvement over last prealbumin)  Will stop TPN and see how he does over the weekend with IVF and po intake 14.  Anemia, acute blood loss  Hgb - 9.0 -09/10/2016    Active Problems:   Bowel perforation (HCC)   Colonic ischemia (HCC)   Pain of upper abdomen  Subjective:  Looks better.  Trying to eat more.  Feels better.    Wife in room.    I gave her prescriptions for Imodium, Fe, and Metamucil (she can get these discounted through the [place])  Objective:   Vitals:   09/10/16 2234 09/11/16 0514  BP: (!) 110/57 100/60  Pulse: 74 73  Resp: 18 18  Temp: 98.2 F (36.8 C) 99.2 F (37.3 C)     Intake/Output from previous day:  02/08 0701 - 02/09 0700 In: 4848.6 [P.O.:1548; I.V.:3300.6] Out: 3725 [Urine:1500; Stool:2225]  Intake/Output this shift:  Total I/O In: 480 [I.V.:480] Out: -    Physical Exam:   General: WN WM who is alert and oriented.    HEENT: Normal. Pupils equal.  Has IJ in right neck. .   Lungs: Clear.   Abdomen:  Ileostomy  in RUQ  Ileostomy measured output yesterday = 2,225 cc last 24 hours   Wound: Clean - The upper part of the wound has some fascial exposure, the rest of the wound looks good.   Lab Results:     Recent Labs  09/10/16 0553  WBC 9.1  HGB 9.0*  HCT 28.1*  PLT 264    BMET    Recent Labs  09/10/16 0553 09/11/16 0845  NA 136 135  K 4.7 4.1  CL 106 105  CO2 23 23  GLUCOSE 118* 140*  BUN 34* 29*  CREATININE 1.87* 1.62*  CALCIUM 8.8* 8.9    PT/INR  No results for input(s): LABPROT, INR in the last 72 hours.  ABG  No results for input(s): PHART, HCO3 in the last 72  hours.  Invalid input(s): PCO2, PO2   Studies/Results:  No results found.   Anti-infectives:   Anti-infectives    Start     Dose/Rate Route Frequency Ordered Stop   09/02/16 1300  vancomycin (VANCOCIN) 1,250 mg in sodium chloride 0.9 % 250 mL IVPB  Status:  Discontinued     1,250 mg 166.7 mL/hr over 90 Minutes Intravenous Every 24 hours 09/02/16 1155 09/06/16 0921   08/28/16 0045  piperacillin-tazobactam (ZOSYN) IVPB 3.375 g  Status:  Discontinued     3.375 g 12.5 mL/hr over 240 Minutes Intravenous Every 8 hours 08/28/16 0033 08/28/16 0038   08/28/16 0034  piperacillin-tazobactam (ZOSYN) IVPB 3.375 g  Status:  Discontinued     3.375 g 12.5 mL/hr over 240 Minutes Intravenous Every 8 hours 08/28/16 0034 09/06/16 0921   08/28/16 0015  vancomycin (VANCOCIN) IVPB 750 mg/150 ml premix  Status:  Discontinued     750 mg 150 mL/hr over 60 Minutes Intravenous Every 12 hours 08/28/16 0001 08/29/16 1454   08/28/16 0000  fluconazole (DIFLUCAN) IVPB 400 mg  Status:  Discontinued     400 mg 100 mL/hr over 120 Minutes Intravenous Every 24 hours 08/28/16 0000 08/30/16 0950   08/27/16 1715  piperacillin-tazobactam (ZOSYN) IVPB 3.375 g     3.375 g 100 mL/hr over 30 Minutes Intravenous  Once 08/27/16 1709 08/27/16 1631      [name], MD, FACS Pager: [facility] Office: [phone] 09/11/2016

## 2016-09-12 LAB — BASIC METABOLIC PANEL
Anion gap: 8 (ref 5–15)
BUN: 30 mg/dL — AB (ref 6–20)
CALCIUM: 9.1 mg/dL (ref 8.9–10.3)
CO2: 22 mmol/L (ref 22–32)
CREATININE: 1.55 mg/dL — AB (ref 0.61–1.24)
Chloride: 106 mmol/L (ref 101–111)
GFR calc non Af Amer: 43 mL/min — ABNORMAL LOW (ref >60)
GFR, EST AFRICAN AMERICAN: 50 mL/min — AB (ref >60)
Glucose, Bld: 109 mg/dL — ABNORMAL HIGH (ref 65–99)
Potassium: 4.7 mmol/L (ref 3.5–5.1)
SODIUM: 136 mmol/L (ref 135–145)

## 2016-09-12 LAB — GLUCOSE, CAPILLARY
GLUCOSE-CAPILLARY: 104 mg/dL — AB (ref 65–99)
Glucose-Capillary: 108 mg/dL — ABNORMAL HIGH (ref 65–99)
Glucose-Capillary: 99 mg/dL (ref 65–99)

## 2016-09-12 MED ORDER — SODIUM CHLORIDE 0.9% FLUSH
10.0000 mL | INTRAVENOUS | Status: DC | PRN
  Administered 2016-09-14: 20 mL
  Filled 2016-09-12: qty 40

## 2016-09-12 NOTE — Progress Notes (Signed)
Family involved in care of ostomy care. Wife able to demonstrate proper application for new applicance. Will continue to involve family in care of patient ostomy.SRP, RN

## 2016-09-12 NOTE — Progress Notes (Signed)
Peripherally Inserted Central Catheter/Midline Placement  The IV Nurse has discussed with the patient and/or persons authorized to consent for the patient, the purpose of this procedure and the potential benefits and risks involved with this procedure.  The benefits include less needle sticks, lab draws from the catheter, and the patient may be discharged home with the catheter. Risks include, but not limited to, infection, bleeding, blood clot (thrombus formation), and puncture of an artery; nerve damage and irregular heartbeat and possibility to perform a PICC exchange if needed/ordered by physician.  Alternatives to this procedure were also discussed.  Bard Power PICC patient education guide, fact sheet on infection prevention and patient information card has been provided to patient /or left at bedside.    PICC/Midline Placement Documentation  PICC Double Lumen 09/12/16 PICC Right Brachial 40 cm 0 cm (Active)  Indication for Insertion or Continuance of Line Home intravenous therapies (PICC only) 09/12/2016  6:24 PM  Exposed Catheter (cm) 0 cm 09/12/2016  6:24 PM  Site Assessment Clean;Dry;Intact 09/12/2016  6:24 PM  Lumen #1 Status Flushed;Saline locked;Blood return noted 09/12/2016  6:24 PM  Lumen #2 Status Flushed;Saline locked;Blood return noted 09/12/2016  6:24 PM  Dressing Type Transparent 09/12/2016  6:24 PM  Dressing Status Clean;Dry;Intact;Antimicrobial disc in place 09/12/2016  6:24 PM  Line Care Connections checked and tightened 09/12/2016  6:24 PM  Line Adjustment (NICU/IV Team Only) No 09/12/2016  6:24 PM  Dressing Intervention New dressing 09/12/2016  6:24 PM  Dressing Change Due 09/19/16 09/12/2016  6:24 PM       Rolena Infante 09/12/2016, 6:24 PM

## 2016-09-12 NOTE — Progress Notes (Signed)
R IJ CVC d/c'ed per order post PICC placement.  Site cleaned with betadine, covered with vaseline guaze and dry 2x2.  Site without signs of infection or bleeding noted.

## 2016-09-12 NOTE — Progress Notes (Signed)
General Surgery [facility]  Assessment & Plan:  1.  EXPLORATORYLAPAROTOMY, LYSIS OF ADHESIONS, ILEOSTOMY, RIGHT COLECTOMY with end ileostomy and Hartmann's pouch (at left transverse colon) - 08/27/2016 - D. Newman             For ischemic/infarcted right transverse colon             Wound is dressed - some leakage from ileostomy bag overnight             On regular diet  PICC line today             Targeting to go home Monday, 2/12.  Dr. [name] has orders for [facility] for IVF, wound care, and home PT. 2.  Open RESECTION TRANSVERSE COLON, Enterolysis of adhesions -  08/14/2016 - D. 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 Chronic renal insufficiency -  Primary issue is managing his volume and output from his ileostomy             Creatinine continues to improve 4. ATRIAL FIBRILLATION, CONTROLLED (I48.91) Followed by Dr. Aundra Dubin for cardiology On Amiodarone Was on apixaban 5 mg BID - last dose 08/26/2016 - hold anti-coagulation for now 5. ASCENDING AORTIC ANEURYSM (I71.2) Impression: Followed by Dr. Servando Snare 6. History of Crohn's disesase Prior ileocecectomy around 68. He required a second operation. He had a fistula that took months to heal. He did not have straight forward surgery. He has has some prior SBO secondary to adhesions from that surgery 7. History of PE 8. History of Hep C - from blood transfusion during his bowel resection in the 1970's 9. Nephrolithiasis 10. Post tramatic stress syndrome - from service             On Paxil  11. Gout involving left knee             Restart Uloric 12.  DVT prophylaxis - on SQ Heparin 13.  Nutrition -              Prealbumin - 28 - 09/06/2016 (substantial improvement over last prealbumin)             Will stop TPN and see how he does over the weekend with IVF and po intake 14.   Anemia, acute blood loss             Hgb - 9.0 -09/10/2016          Earnstine Regal, MD, [facility]       Office: [phone]    Subjective: Patient in bed, family at bedside.  Complains of leakage from ileostomy bag overnight.  Tolerating po diet.  Anticipating PICC line placement today.  Objective: Vital signs in last 24 hours: Temp:  [97.5 F (36.4 C)-98.1 F (36.7 C)] 97.9 F (36.6 C) (02/10 0541) Pulse Rate:  [72-74] 73 (02/10 0541) Resp:  [18-19] 19 (02/10 0541) BP: (105-109)/(54-85) 109/85 (02/10 0541) SpO2:  [97 %-100 %] 100 % (02/10 0541) Weight:  [94 kg (207 lb 3.7 oz)] 94 kg (207 lb 3.7 oz) (02/10 0541) Last BM Date: 09/07/16  Intake/Output from previous day: 02/09 0701 - 02/10 0700 In: 2416.7 [I.V.:2416.7] Out: 2980 [Urine:1300; Stool:1680] Intake/Output this shift: No intake/output data recorded.  Physical Exam: HEENT - sclerae clear, mucous membranes moist Neck - soft Chest - clear bilaterally  Cor - RRR Abdomen - soft without distension; dressing dry and intact; ileostomy with small succus in bag, slight retraction Ext - no edema, non-tender Neuro - alert & oriented, no focal deficits  Lab Results:   Recent Labs  09/10/16 0553  WBC 9.1  HGB 9.0*  HCT 28.1*  PLT 264   BMET  Recent Labs  09/11/16 0845 09/12/16 0505  NA 135 136  K 4.1 4.7  CL 105 106  CO2 23 22  GLUCOSE 140* 109*  BUN 29* 30*  CREATININE 1.62* 1.55*  CALCIUM 8.9 9.1   PT/INR No results for input(s): LABPROT, INR in the last 72 hours. Comprehensive Metabolic Panel:    Component Value Date/Time   NA 136 09/12/2016 0505   NA 135 09/11/2016 0845   NA 139 11/23/2012 1047   K 4.7 09/12/2016 0505   K 4.1 09/11/2016 0845   K 4.5 11/23/2012 1047   CL 106 09/12/2016 0505   CL 105 09/11/2016 0845   CL 103 11/23/2012 1047   CO2 22 09/12/2016 0505   CO2 23 09/11/2016 0845   CO2 26 11/23/2012 1047   BUN 30 (H) 09/12/2016 0505   BUN 29 (H)  09/11/2016 0845   BUN 25.3 11/23/2012 1047   CREATININE 1.55 (H) 09/12/2016 0505   CREATININE 1.62 (H) 09/11/2016 0845   CREATININE 1.8 (H) 09/25/2015 1020   CREATININE 1.9 (H) 11/23/2012 1047   GLUCOSE 109 (H) 09/12/2016 0505   GLUCOSE 140 (H) 09/11/2016 0845   GLUCOSE 91 11/23/2012 1047   CALCIUM 9.1 09/12/2016 0505   CALCIUM 8.9 09/11/2016 0845   CALCIUM 8.8 11/23/2012 1047   AST 24 09/10/2016 0553   AST 26 09/07/2016 0527   AST 28 11/23/2012 1047   ALT 21 09/10/2016 0553   ALT 26 09/07/2016 0527   ALT 43 11/23/2012 1047   ALKPHOS 179 (H) 09/10/2016 0553   ALKPHOS 227 (H) 09/07/2016 0527   ALKPHOS 150 11/23/2012 1047   BILITOT 0.6 09/10/2016 0553   BILITOT 0.7 09/07/2016 0527   BILITOT 0.74 11/23/2012 1047   PROT 6.2 (L) 09/10/2016 0553   PROT 7.1 09/07/2016 0527   PROT 6.7 11/23/2012 1047   ALBUMIN 2.3 (L) 09/10/2016 0553   ALBUMIN 2.3 (L) 09/07/2016 0527   ALBUMIN 3.4 (L) 11/23/2012 1047    Studies/Results: No results found.    Derek [name] 09/12/2016  Patient ID: Derek [name], male   DOB: January 29, 1941, 76 y.o.   MRN: [id number]

## 2016-09-13 LAB — GLUCOSE, CAPILLARY
GLUCOSE-CAPILLARY: 90 mg/dL (ref 65–99)
GLUCOSE-CAPILLARY: 84 mg/dL (ref 65–99)
GLUCOSE-CAPILLARY: 103 mg/dL — AB (ref 65–99)
Glucose-Capillary: 99 mg/dL (ref 65–99)

## 2016-09-13 LAB — BASIC METABOLIC PANEL
Anion gap: 7 (ref 5–15)
BUN: 29 mg/dL — ABNORMAL HIGH (ref 6–20)
CALCIUM: 8.8 mg/dL — AB (ref 8.9–10.3)
CHLORIDE: 108 mmol/L (ref 101–111)
CO2: 22 mmol/L (ref 22–32)
CREATININE: 1.58 mg/dL — AB (ref 0.61–1.24)
GFR calc non Af Amer: 42 mL/min — ABNORMAL LOW (ref >60)
GFR, EST AFRICAN AMERICAN: 48 mL/min — AB (ref >60)
Glucose, Bld: 94 mg/dL (ref 65–99)
Potassium: 5 mmol/L (ref 3.5–5.1)
SODIUM: 137 mmol/L (ref 135–145)

## 2016-09-13 NOTE — Progress Notes (Signed)
General [name]  Assessment & Plan:  1. EXPLORATORYLAPAROTOMY, LYSIS OF ADHESIONS, ILEOSTOMY, RIGHT COLECTOMY with end ileostomy and Hartmann's pouch (at left transverse colon) - 08/27/2016 - D. Newman For ischemic/infarcted right transverse colon Wound with fresh dressing - ostomy bag without leakage overnight - family changing Regular diet tolerated this AM             PICC line in place and IJ line removed Targeting to go home Monday, 2/12. Dr. [name] has orders for [facility] for IVF, wound care, and home PT. 2. Open RESECTION TRANSVERSE COLON, Enterolysis of adhesions - 08/14/2016 - D. 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 Chronic renal insufficiency - Primary issue is managing his volume and output from his ileostomy Creatinine continues to improve 4. ATRIAL FIBRILLATION, CONTROLLED (I48.91) Followed by Dr. Aundra Dubin for cardiology On Amiodarone Was on apixaban 5 mg BID - last dose 08/26/2016 - hold anti-coagulation for now 5. ASCENDING AORTIC ANEURYSM (I71.2) Impression: Followed by Dr. Servando Snare 6. History of Crohn's disesase 7. History of PE 8. History of Hep C - from blood transfusion during his bowel resection in the 1970's 9. Nephrolithiasis 10. Post tramatic stress syndrome - from service On Paxil  11. Gout involving left knee Restart Uloric 12. DVT prophylaxis - on SQ Heparin 13. Nutrition -  Prealbumin - 28 - 09/06/2016 (substantial improvement over last prealbumin) 14. Anemia, acute blood loss Hgb - 9.0 -09/10/2016         Derek [name], Derek [name], [name] P[facility]       Office: [phone]    Subjective: Patient in bed, just finishing breakfast.  Family at bedside.  In good spirits today.   No leaks overnight.  Objective: Vital signs in last 24 hours: Temp:  [97.9 F (36.6 C)-98.7 F (37.1 C)] 98 F (36.7 C) (02/11 0507) Pulse Rate:  [66-72] 72 (02/11 0507) Resp:  [19-20] 20 (02/11 0507) BP: (93-101)/(64-73) 93/67 (02/11 0507) SpO2:  [95 %-99 %] 95 % (02/11 0507) Weight:  [94.2 kg (207 lb 10.8 oz)] 94.2 kg (207 lb 10.8 oz) (02/11 0507) Last BM Date: 09/07/16  Intake/Output from previous day: 02/10 0701 - 02/11 0700 In: 1273.3 [I.V.:1273.3] Out: 1810 [Urine:460; Stool:1350] Intake/Output this shift: Total I/O In: 722.7 [I.V.:722.7] Out: -   Physical Exam: HEENT - sclerae clear, mucous membranes moist Neck - soft, dressing on right IJ site dry Abdomen - soft, midline dressing dry and intact; ostomy bag with moderate thin liquid succus Ext - no edema, non-tender Neuro - alert & oriented, no focal deficits  Lab Results:  No results for input(s): WBC, HGB, HCT, PLT in the last 72 hours. BMET  Recent Labs  09/12/16 0505 09/13/16 0506  NA 136 137  K 4.7 5.0  CL 106 108  CO2 22 22  GLUCOSE 109* 94  BUN 30* 29*  CREATININE 1.55* 1.58*  CALCIUM 9.1 8.8*   PT/INR No results for input(s): LABPROT, INR in the last 72 hours. Comprehensive Metabolic Panel:    Component Value Date/Time   NA 137 09/13/2016 0506   NA 136 09/12/2016 0505   NA 139 11/23/2012 1047   K 5.0 09/13/2016 0506   K 4.7 09/12/2016 0505   K 4.5 11/23/2012 1047   CL 108 09/13/2016 0506   CL 106 09/12/2016 0505   CL 103 11/23/2012 1047   CO2 22 09/13/2016 0506   CO2  22 09/12/2016 0505   CO2 26 11/23/2012 1047   BUN 29 (H) 09/13/2016 0506   BUN 30 (H) 09/12/2016 0505   BUN 25.3 11/23/2012 1047   CREATININE 1.58 (H) 09/13/2016 0506   CREATININE 1.55 (H) 09/12/2016 0505   CREATININE 1.8 (H) 09/25/2015 1020   CREATININE 1.9 (H) 11/23/2012 1047   GLUCOSE 94 09/13/2016 0506   GLUCOSE 109 (H) 09/12/2016 0505   GLUCOSE 91 11/23/2012 1047   CALCIUM 8.8 (L) 09/13/2016 0506   CALCIUM  9.1 09/12/2016 0505   CALCIUM 8.8 11/23/2012 1047   AST 24 09/10/2016 0553   AST 26 09/07/2016 0527   AST 28 11/23/2012 1047   ALT 21 09/10/2016 0553   ALT 26 09/07/2016 0527   ALT 43 11/23/2012 1047   ALKPHOS 179 (H) 09/10/2016 0553   ALKPHOS 227 (H) 09/07/2016 0527   ALKPHOS 150 11/23/2012 1047   BILITOT 0.6 09/10/2016 0553   BILITOT 0.7 09/07/2016 0527   BILITOT 0.74 11/23/2012 1047   PROT 6.2 (L) 09/10/2016 0553   PROT 7.1 09/07/2016 0527   PROT 6.7 11/23/2012 1047   ALBUMIN 2.3 (L) 09/10/2016 0553   ALBUMIN 2.3 (L) 09/07/2016 0527   ALBUMIN 3.4 (L) 11/23/2012 1047    Studies/Results: No results found.    Derek [name] M 09/13/2016  Patient ID: Derek [name], male   DOB: October 22, 1940, 76 y.o.   MRN: [id number]

## 2016-09-13 NOTE — Consult Note (Signed)
[facility] Nurse ostomy follow up Nursing unit contacted this writer last pm requesting assistance in obtaining correct size ostomy pouches and skin barrier rings. Supplies delivered to patient room at approximately 7:15pm.  MD newman notes no leaks over night.  Suspect pouch is being allowed to over-fill and the weight of the contents is pulling the pouch away from the patient's body.  When pouches are emptied when 1/3 to 1/2 full, no leakages are occurring. [place] nursing team will not follow, but will remain available to this patient, the nursing and medical teams.  Please re-consult if needed. Thanks, Maudie Flakes, MSN, RN, [place], Ar[facility]  Pager# [phone]

## 2016-09-14 LAB — BASIC METABOLIC PANEL
Anion gap: 7 (ref 5–15)
BUN: 29 mg/dL — ABNORMAL HIGH (ref 6–20)
CHLORIDE: 109 mmol/L (ref 101–111)
CO2: 21 mmol/L — ABNORMAL LOW (ref 22–32)
Calcium: 9 mg/dL (ref 8.9–10.3)
Creatinine, Ser: 1.65 mg/dL — ABNORMAL HIGH (ref 0.61–1.24)
GFR calc Af Amer: 46 mL/min — ABNORMAL LOW (ref >60)
GFR calc non Af Amer: 40 mL/min — ABNORMAL LOW (ref >60)
GLUCOSE: 97 mg/dL (ref 65–99)
POTASSIUM: 4.7 mmol/L (ref 3.5–5.1)
SODIUM: 137 mmol/L (ref 135–145)

## 2016-09-14 LAB — PREALBUMIN: Prealbumin: 27.2 mg/dL (ref 18–38)

## 2016-09-14 LAB — GLUCOSE, CAPILLARY: GLUCOSE-CAPILLARY: 108 mg/dL — AB (ref 65–99)

## 2016-09-14 MED ORDER — SODIUM CHLORIDE 0.9 % IV SOLN: 2000.0000 mL | INTRAVENOUS | 0 refills | Status: AC

## 2016-09-14 MED ORDER — HEPARIN SOD (PORK) LOCK FLUSH 100 UNIT/ML IV SOLN
250.0000 [IU] | INTRAVENOUS | Status: AC | PRN
  Administered 2016-09-14: 250 [IU]

## 2016-09-14 NOTE — Discharge Instructions (Signed)
[facility] - DISCHARGE INSTRUCTIONS TO PATIENT  Activity:  Be as active as possible  Wound Care:   Change dressing once a day  Diet:  As tolerated  Follow up appointment:  Call Dr. Pollie Friar office [facility]) at [phone] for an appointment in 2 weeks  Medications and dosages:  Resume your home medications.  You have a prescription for:  Imodium, Iron, and Metamucil  Call Dr. [name] or his office  [phone]) if you have:  Temperature greater than 100.4,  Persistent nausea and vomiting,  Severe uncontrolled pain,  Redness, tenderness, or signs of infection (pain, swelling, redness, odor or green/yellow discharge around the site),  Difficulty breathing, headache or visual disturbances,  Any other questions or concerns you may have after discharge.  In an emergency, call 911 or go to an Emergency Department at a nearby hospital.

## 2016-09-14 NOTE — Progress Notes (Signed)
Spoke with pt's wife to confirm [facility] care with [facility]. RW at bedside. Pt was ready for discharge.

## 2016-09-14 NOTE — Discharge Summary (Signed)
Physician Discharge Summary  Patient ID:  Derek [name]  MRN: [id number]  DOB/AGE: [birth year] 76 y.o.  Admit date: 08/27/2016 Discharge date: 09/14/2016  Discharge Diagnoses:  1.  Ischemic right transverse colon with perforation  2.  Open RESECTION TRANSVERSE COLON, Enterolysis of adhesions -  08/14/2016 - D. 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 Chronic renal insufficiency -  Primary issue is managing his volume and output from his ileostomy -  Plan to discharge on 2,000 cc of fluid IV per [facility] daily.             Creatinine - 2.45 - 09/07/2016                 Creatinine - 1.65 - 09/14/2016  Followed by Dr. Lenna Sciara. Deterding q 3 months             Followed by Dr. Jonnie Finner in hospital  4. ATRIAL FIBRILLATION, CONTROLLED (I48.91) Followed by Dr. Aundra Dubin for cardiology On Amiodarone Restart apixaban 5 mg BID - last dose 08/26/2016 5. ASCENDING AORTIC ANEURYSM (I71.2) Impression: Followed by Dr. Servando Snare  6. History of Crohn's disesase Prior ileocecectomy around 79. He required a second operation. He had a fistula that took months to heal. He did not have straight forward surgery. He has has some prior SBO secondary to adhesions from that surgery 7. History of PE 8. History of Hep C - from blood transfusion during his bowel resection in the 1970's 9. Nephrolithiasis 10. Post tramatic stress syndrome - from service             On Paxil  11. Gout involving left knee             On Uloric 12.  Nutrition -              Prealbumin - 27 - 09/14/2016  13.  Anemia, acute blood loss             Hgb - 9.0 -09/10/2016    Active Problems:   Bowel perforation (HCC)   Colonic ischemia (HCC)   Pain of upper abdomen   Operation: Procedure(s):  EXPLORATORYLAPAROTOMY, LYSIS OF ADHESIONS (for 90 minutes), RIGHT COLECTOMY with end ileostomy and  Hartmann's pouch (at left transverse colon) - 08/27/2016 - D. [name]  Discharged Condition: fair  Hospital Course: Derek [name] is an 76 y.o. male whose primary care physician is Purvis Kilts, MD and who was admitted 08/27/2016 with a chief complaint of pneumoperitoneum.  He had had a recent transverse colectomy on 08/14/2016 for a transverse colon cancer.  A CT scan of abdomen/pelvis on 08/27/2016 showed changes consistent with breakdown of recent surgical anastomosis in the right upper abdomen with spillage of both air and apparent fecal material as well as contrast in to the peritoneum. Marland Kitchen   He was brought to the operating room on 08/27/2016 and underwent  EXPLORATORYLAPAROTOMY, LYSIS OF ADHESIONS (for 90 minutes), RIGHT COLECTOMY with end ileostomy and Hartmann's pouch (at left transverse colon).   He was found to have ischemic changes of the right transverse colon proximal to the anastomosis.  Post op, he was placed in the ICU and [place] was consulted. He actually did fairly well with his heart and cardiac status early after surgery.  His Eliquis was held for most of his hospitalization because of the concern of further invasive procedure.  His  WBC normalized well.  He was given 10 days of Zozyn and about 5 days of Vancomycin. I repeated a CT scan on his abdomen and pelvis on 09/04/2015 and there was no obvious intra-abdominal fluid collection.  So I removed his drain on 09/05/2106 and stopped his antibiotics on 09/06/2106.  For nutrition, he was kept on TPN for much of his post op course.  HisPrealbumin was 27 today.  His creatinine started climbing on 09/04/2016 and peaked at 2.45 on 09/07/2016.  I asked Dr. Jonnie Finner to follow [name] with [place].  His primary problem though was not renal, but the large amount of output from his ileostomy and his inability to keep up with this output.  I put him on Imodium, Fe, and metamucil to try to slow the ileostomy output down,  But so  far, this has not done much.  His Creatinine today was 1.65 - back in his baseline range. His wound is open and clean. His wife has a good handle on the wound and the ostomy. We will arrange for [facility] to provide a hospital bed.  [facility]HC will help with wound and ostomy care.  Also, I have written for 2,000 of NS daily IV, until his output is low enough or he can keep up on as openl  The discharge instructions were reviewed with the patient.  Consults: nephrology (Dr. Jonnie Finner)  [place] Babcock/Dr. Lake Bells  Significant Diagnostic Studies: Results for orders placed or performed during the hospital encounter of 08/27/16  Culture, expectorated sputum-assessment  Result Value Ref Range   Specimen Description SPUTUM    Special Requests Normal    Sputum evaluation      Sputum specimen not acceptable for testing.  Please recollect.   NOTIFIED A.HALL RN [phone] A.QUIZON    Report Status 08/27/2016 FINAL   Aerobic/Anaerobic Culture (surgical/deep wound)  Result Value Ref Range   Specimen Description WOUND    Special Requests EXPLORATORY LAPAROTOMY    Gram Stain      RARE WBC PRESENT,BOTH PMN AND MONONUCLEAR FEW GRAM POSITIVE COCCI FEW GRAM NEGATIVE RODS Performed at [facility], [place] [address], [place], [place] [address]    Culture      ABUNDANT ENTEROCOCCUS SPECIES FEW HAEMOPHILUS PARAINFLUENZAE BETA LACTAMASE NEGATIVE MIXED ANAEROBIC FLORA PRESENT.  CALL LAB IF FURTHER IID REQUIRED.    Report Status 09/01/2016 FINAL    Organism ID, Bacteria ENTEROCOCCUS SPECIES       Susceptibility   Enterococcus species - MIC*    AMPICILLIN 4 RESISTANT Resistant     VANCOMYCIN <=0.5 SENSITIVE Sensitive     GENTAMICIN SYNERGY SENSITIVE Sensitive     * ABUNDANT ENTEROCOCCUS SPECIES  Lipase, blood  Result Value Ref Range   Lipase 72 (H) 11 - 51 U/L  Comprehensive metabolic panel  Result Value Ref Range   Sodium 134 (L) 135 - 145 mmol/L   Potassium 4.5 3.5 - 5.1  mmol/L   Chloride 100 (L) 101 - 111 mmol/L   CO2 25 22 - 32 mmol/L   Glucose, Bld 131 (H) 65 - 99 mg/dL   BUN 34 (H) 6 - 20 mg/dL   Creatinine, Ser 1.73 (H) 0.61 - 1.24 mg/dL   Calcium 8.9 8.9 - 10.3 mg/dL   Total Protein 6.8 6.5 - 8.1 g/dL   Albumin 2.9 (L) 3.5 - 5.0 g/dL   AST 40 15 - 41 U/L   ALT 60 17 - 63 U/L   Alkaline Phosphatase 253 (H)  38 - 126 U/L   Total Bilirubin 0.5 0.3 - 1.2 mg/dL   GFR calc non Af Amer 37 (L) >60 mL/min   GFR calc Af Amer 43 (L) >60 mL/min   Anion gap 9 5 - 15  CBC  Result Value Ref Range   WBC 15.3 (H) 4.0 - 10.5 K/uL   RBC 4.26 4.22 - 5.81 MIL/uL   Hemoglobin 12.0 (L) 13.0 - 17.0 g/dL   HCT 35.3 (L) 39.0 - 52.0 %   MCV 82.9 78.0 - 100.0 fL   MCH 28.2 26.0 - 34.0 pg   MCHC 34.0 30.0 - 36.0 g/dL   RDW 13.2 11.5 - 15.5 %   Platelets 263 150 - 400 K/uL  Urinalysis, Routine w reflex microscopic  Result Value Ref Range   Color, Urine YELLOW YELLOW   APPearance CLEAR CLEAR   Specific Gravity, Urine 1.017 1.005 - 1.030   pH 5.0 5.0 - 8.0   Glucose, UA NEGATIVE NEGATIVE mg/dL   Hgb urine dipstick NEGATIVE NEGATIVE   Bilirubin Urine NEGATIVE NEGATIVE   Ketones, ur NEGATIVE NEGATIVE mg/dL   Protein, ur NEGATIVE NEGATIVE mg/dL   Nitrite NEGATIVE NEGATIVE   Leukocytes, UA NEGATIVE NEGATIVE  CBC  Result Value Ref Range   WBC 21.3 (H) 4.0 - 10.5 K/uL   RBC 3.03 (L) 4.22 - 5.81 MIL/uL   Hemoglobin 8.6 (L) 13.0 - 17.0 g/dL   HCT 25.8 (L) 39.0 - 52.0 %   MCV 85.1 78.0 - 100.0 fL   MCH 28.4 26.0 - 34.0 pg   MCHC 33.3 30.0 - 36.0 g/dL   RDW 13.8 11.5 - 15.5 %   Platelets 239 150 - 400 K/uL  CBC  Result Value Ref Range   WBC 18.7 (H) 4.0 - 10.5 K/uL   RBC 3.50 (L) 4.22 - 5.81 MIL/uL   Hemoglobin 9.7 (L) 13.0 - 17.0 g/dL   HCT 30.1 (L) 39.0 - 52.0 %   MCV 86.0 78.0 - 100.0 fL   MCH 27.7 26.0 - 34.0 pg   MCHC 32.2 30.0 - 36.0 g/dL   RDW 13.7 11.5 - 15.5 %   Platelets 302 150 - 400 K/uL  Comprehensive metabolic panel  Result Value Ref Range    Sodium 133 (L) 135 - 145 mmol/L   Potassium 4.1 3.5 - 5.1 mmol/L   Chloride 102 101 - 111 mmol/L   CO2 21 (L) 22 - 32 mmol/L   Glucose, Bld 137 (H) 65 - 99 mg/dL   BUN 28 (H) 6 - 20 mg/dL   Creatinine, Ser 1.70 (H) 0.61 - 1.24 mg/dL   Calcium 8.0 (L) 8.9 - 10.3 mg/dL   Total Protein 4.7 (L) 6.5 - 8.1 g/dL   Albumin 2.6 (L) 3.5 - 5.0 g/dL   AST 35 15 - 41 U/L   ALT 39 17 - 63 U/L   Alkaline Phosphatase 166 (H) 38 - 126 U/L   Total Bilirubin 1.6 (H) 0.3 - 1.2 mg/dL   GFR calc non Af Amer 38 (L) >60 mL/min   GFR calc Af Amer 44 (L) >60 mL/min   Anion gap 10 5 - 15  Lactic acid, plasma  Result Value Ref Range   Lactic Acid, Venous 2.7 (HH) 0.5 - 1.9 mmol/L  Cortisol  Result Value Ref Range   Cortisol, Plasma 92.2 ug/dL  Troponin I  Result Value Ref Range   Troponin I <0.03 <0.03 ng/mL  Comprehensive metabolic panel  Result Value Ref Range   Sodium 134 (  L) 135 - 145 mmol/L   Potassium 5.0 3.5 - 5.1 mmol/L   Chloride 105 101 - 111 mmol/L   CO2 23 22 - 32 mmol/L   Glucose, Bld 136 (H) 65 - 99 mg/dL   BUN 25 (H) 6 - 20 mg/dL   Creatinine, Ser 1.41 (H) 0.61 - 1.24 mg/dL   Calcium 7.5 (L) 8.9 - 10.3 mg/dL   Total Protein 4.7 (L) 6.5 - 8.1 g/dL   Albumin 2.2 (L) 3.5 - 5.0 g/dL   AST 29 15 - 41 U/L   ALT 36 17 - 63 U/L   Alkaline Phosphatase 130 (H) 38 - 126 U/L   Total Bilirubin 1.5 (H) 0.3 - 1.2 mg/dL   GFR calc non Af Amer 48 (L) >60 mL/min   GFR calc Af Amer 56 (L) >60 mL/min   Anion gap 6 5 - 15  Glucose, capillary  Result Value Ref Range   Glucose-Capillary 115 (H) 65 - 99 mg/dL   Comment 1 Notify RN    Comment 2 Document in Chart   CBC  Result Value Ref Range   WBC 14.4 (H) 4.0 - 10.5 K/uL   RBC 2.72 (L) 4.22 - 5.81 MIL/uL   Hemoglobin 7.7 (L) 13.0 - 17.0 g/dL   HCT 23.5 (L) 39.0 - 52.0 %   MCV 86.4 78.0 - 100.0 fL   MCH 28.3 26.0 - 34.0 pg   MCHC 32.8 30.0 - 36.0 g/dL   RDW 13.7 11.5 - 15.5 %   Platelets 151 150 - 400 K/uL  Comprehensive metabolic panel   Result Value Ref Range   Sodium 136 135 - 145 mmol/L   Potassium 4.3 3.5 - 5.1 mmol/L   Chloride 107 101 - 111 mmol/L   CO2 23 22 - 32 mmol/L   Glucose, Bld 170 (H) 65 - 99 mg/dL   BUN 19 6 - 20 mg/dL   Creatinine, Ser 1.33 (H) 0.61 - 1.24 mg/dL   Calcium 7.5 (L) 8.9 - 10.3 mg/dL   Total Protein 4.6 (L) 6.5 - 8.1 g/dL   Albumin 1.9 (L) 3.5 - 5.0 g/dL   AST 17 15 - 41 U/L   ALT 25 17 - 63 U/L   Alkaline Phosphatase 89 38 - 126 U/L   Total Bilirubin 0.6 0.3 - 1.2 mg/dL   GFR calc non Af Amer 51 (L) >60 mL/min   GFR calc Af Amer 60 (L) >60 mL/min   Anion gap 6 5 - 15  Prealbumin  Result Value Ref Range   Prealbumin 10.0 (L) 18 - 38 mg/dL  Magnesium  Result Value Ref Range   Magnesium 1.6 (L) 1.7 - 2.4 mg/dL  Phosphorus  Result Value Ref Range   Phosphorus 3.8 2.5 - 4.6 mg/dL  Triglycerides  Result Value Ref Range   Triglycerides 85 <150 mg/dL  Differential  Result Value Ref Range   Neutrophils Relative % 91 %   Neutro Abs 13.1 (H) 1.7 - 7.7 K/uL   Lymphocytes Relative 3 %   Lymphs Abs 0.4 (L) 0.7 - 4.0 K/uL   Monocytes Relative 5 %   Monocytes Absolute 0.7 0.1 - 1.0 K/uL   Eosinophils Relative 1 %   Eosinophils Absolute 0.2 0.0 - 0.7 K/uL   Basophils Relative 0 %   Basophils Absolute 0.0 0.0 - 0.1 K/uL  Glucose, capillary  Result Value Ref Range   Glucose-Capillary 141 (H) 65 - 99 mg/dL   Comment 1 Notify RN    Comment 2  Document in Chart   Glucose, capillary  Result Value Ref Range   Glucose-Capillary 141 (H) 65 - 99 mg/dL   Comment 1 Notify RN    Comment 2 Document in Chart   Glucose, capillary  Result Value Ref Range   Glucose-Capillary 129 (H) 65 - 99 mg/dL  Glucose, capillary  Result Value Ref Range   Glucose-Capillary 153 (H) 65 - 99 mg/dL   Comment 1 Notify RN    Comment 2 Document in Chart   Glucose, capillary  Result Value Ref Range   Glucose-Capillary 150 (H) 65 - 99 mg/dL   Comment 1 Notify RN    Comment 2 Document in Chart   CBC  Result  Value Ref Range   WBC 16.6 (H) 4.0 - 10.5 K/uL   RBC 2.66 (L) 4.22 - 5.81 MIL/uL   Hemoglobin 7.4 (L) 13.0 - 17.0 g/dL   HCT 22.6 (L) 39.0 - 52.0 %   MCV 85.0 78.0 - 100.0 fL   MCH 27.8 26.0 - 34.0 pg   MCHC 32.7 30.0 - 36.0 g/dL   RDW 13.8 11.5 - 15.5 %   Platelets 252 150 - 400 K/uL  Glucose, capillary  Result Value Ref Range   Glucose-Capillary 121 (H) 65 - 99 mg/dL   Comment 1 Notify RN    Comment 2 Document in Chart   Basic metabolic panel  Result Value Ref Range   Sodium 134 (L) 135 - 145 mmol/L   Potassium 4.4 3.5 - 5.1 mmol/L   Chloride 104 101 - 111 mmol/L   CO2 25 22 - 32 mmol/L   Glucose, Bld 153 (H) 65 - 99 mg/dL   BUN 19 6 - 20 mg/dL   Creatinine, Ser 1.26 (H) 0.61 - 1.24 mg/dL   Calcium 7.7 (L) 8.9 - 10.3 mg/dL   GFR calc non Af Amer 55 (L) >60 mL/min   GFR calc Af Amer >60 >60 mL/min   Anion gap 5 5 - 15  Magnesium  Result Value Ref Range   Magnesium 1.9 1.7 - 2.4 mg/dL  Phosphorus  Result Value Ref Range   Phosphorus 3.8 2.5 - 4.6 mg/dL  Glucose, capillary  Result Value Ref Range   Glucose-Capillary 128 (H) 65 - 99 mg/dL   Comment 1 Notify RN    Comment 2 Document in Chart   Glucose, capillary  Result Value Ref Range   Glucose-Capillary 141 (H) 65 - 99 mg/dL   Comment 1 Notify RN    Comment 2 Document in Chart   Glucose, capillary  Result Value Ref Range   Glucose-Capillary 151 (H) 65 - 99 mg/dL   Comment 1 Notify RN    Comment 2 Document in Chart   CBC  Result Value Ref Range   WBC 12.1 (H) 4.0 - 10.5 K/uL   RBC 2.30 (L) 4.22 - 5.81 MIL/uL   Hemoglobin 6.6 (LL) 13.0 - 17.0 g/dL   HCT 19.7 (L) 39.0 - 52.0 %   MCV 85.7 78.0 - 100.0 fL   MCH 28.7 26.0 - 34.0 pg   MCHC 33.5 30.0 - 36.0 g/dL   RDW 13.9 11.5 - 15.5 %   Platelets 219 150 - 400 K/uL  Glucose, capillary  Result Value Ref Range   Glucose-Capillary 143 (H) 65 - 99 mg/dL   Comment 1 Notify RN    Comment 2 Document in Chart   Glucose, capillary  Result Value Ref Range    Glucose-Capillary 124 (H) 65 - 99 mg/dL  Comment 1 Notify RN    Comment 2 Document in Chart   Glucose, capillary  Result Value Ref Range   Glucose-Capillary 121 (H) 65 - 99 mg/dL   Comment 1 Notify RN    Comment 2 Document in Chart   Urinalysis, Complete w Microscopic  Result Value Ref Range   Color, Urine YELLOW YELLOW   APPearance CLEAR CLEAR   Specific Gravity, Urine 1.010 1.005 - 1.030   pH 5.0 5.0 - 8.0   Glucose, UA NEGATIVE NEGATIVE mg/dL   Hgb urine dipstick SMALL (A) NEGATIVE   Bilirubin Urine NEGATIVE NEGATIVE   Ketones, ur NEGATIVE NEGATIVE mg/dL   Protein, ur NEGATIVE NEGATIVE mg/dL   Nitrite NEGATIVE NEGATIVE   Leukocytes, UA NEGATIVE NEGATIVE   RBC / HPF 0-5 0 - 5 RBC/hpf   WBC, UA 0-5 0 - 5 WBC/hpf   Bacteria, UA NONE SEEN NONE SEEN   Squamous Epithelial / LPF 0-5 (A) NONE SEEN  Comprehensive metabolic panel  Result Value Ref Range   Sodium 131 (L) 135 - 145 mmol/L   Potassium 4.0 3.5 - 5.1 mmol/L   Chloride 100 (L) 101 - 111 mmol/L   CO2 25 22 - 32 mmol/L   Glucose, Bld 145 (H) 65 - 99 mg/dL   BUN 20 6 - 20 mg/dL   Creatinine, Ser 1.36 (H) 0.61 - 1.24 mg/dL   Calcium 7.9 (L) 8.9 - 10.3 mg/dL   Total Protein 4.6 (L) 6.5 - 8.1 g/dL   Albumin 1.8 (L) 3.5 - 5.0 g/dL   AST 49 (H) 15 - 41 U/L   ALT 44 17 - 63 U/L   Alkaline Phosphatase 143 (H) 38 - 126 U/L   Total Bilirubin 1.2 0.3 - 1.2 mg/dL   GFR calc non Af Amer 50 (L) >60 mL/min   GFR calc Af Amer 58 (L) >60 mL/min   Anion gap 6 5 - 15  Magnesium  Result Value Ref Range   Magnesium 1.6 (L) 1.7 - 2.4 mg/dL  Phosphorus  Result Value Ref Range   Phosphorus 3.7 2.5 - 4.6 mg/dL  CBC  Result Value Ref Range   WBC 8.3 4.0 - 10.5 K/uL   RBC 2.57 (L) 4.22 - 5.81 MIL/uL   Hemoglobin 7.3 (L) 13.0 - 17.0 g/dL   HCT 21.7 (L) 39.0 - 52.0 %   MCV 84.4 78.0 - 100.0 fL   MCH 28.4 26.0 - 34.0 pg   MCHC 33.6 30.0 - 36.0 g/dL   RDW 13.9 11.5 - 15.5 %   Platelets 186 150 - 400 K/uL  Differential  Result Value  Ref Range   Neutrophils Relative % 86 %   Neutro Abs 7.2 1.7 - 7.7 K/uL   Lymphocytes Relative 6 %   Lymphs Abs 0.5 (L) 0.7 - 4.0 K/uL   Monocytes Relative 6 %   Monocytes Absolute 0.5 0.1 - 1.0 K/uL   Eosinophils Relative 2 %   Eosinophils Absolute 0.2 0.0 - 0.7 K/uL   Basophils Relative 0 %   Basophils Absolute 0.0 0.0 - 0.1 K/uL  Triglycerides  Result Value Ref Range   Triglycerides 94 <150 mg/dL  Prealbumin  Result Value Ref Range   Prealbumin 5.8 (L) 18 - 38 mg/dL  Glucose, capillary  Result Value Ref Range   Glucose-Capillary 155 (H) 65 - 99 mg/dL   Comment 1 Notify RN    Comment 2 Document in Chart   Hemoglobin and hematocrit, blood  Result Value Ref Range  Hemoglobin 7.2 (L) 13.0 - 17.0 g/dL   HCT 21.3 (L) 39.0 - 52.0 %  Glucose, capillary  Result Value Ref Range   Glucose-Capillary 139 (H) 65 - 99 mg/dL   Comment 1 Notify RN    Comment 2 Document in Chart   Glucose, capillary  Result Value Ref Range   Glucose-Capillary 132 (H) 65 - 99 mg/dL   Comment 1 Notify RN    Comment 2 Document in Chart   Glucose, capillary  Result Value Ref Range   Glucose-Capillary 161 (H) 65 - 99 mg/dL  Glucose, capillary  Result Value Ref Range   Glucose-Capillary 170 (H) 65 - 99 mg/dL  Glucose, capillary  Result Value Ref Range   Glucose-Capillary 159 (H) 65 - 99 mg/dL   Comment 1 Notify RN    Comment 2 Document in Chart   Hemoglobin and hematocrit, blood  Result Value Ref Range   Hemoglobin 7.7 (L) 13.0 - 17.0 g/dL   HCT 22.8 (L) 39.0 - 52.0 %  CBC with Differential/Platelet  Result Value Ref Range   WBC 6.7 4.0 - 10.5 K/uL   RBC 2.90 (L) 4.22 - 5.81 MIL/uL   Hemoglobin 8.3 (L) 13.0 - 17.0 g/dL   HCT 24.1 (L) 39.0 - 52.0 %   MCV 83.1 78.0 - 100.0 fL   MCH 28.6 26.0 - 34.0 pg   MCHC 34.4 30.0 - 36.0 g/dL   RDW 13.9 11.5 - 15.5 %   Platelets 193 150 - 400 K/uL   Neutrophils Relative % 87 %   Neutro Abs 5.8 1.7 - 7.7 K/uL   Lymphocytes Relative 6 %   Lymphs Abs 0.4  (L) 0.7 - 4.0 K/uL   Monocytes Relative 5 %   Monocytes Absolute 0.4 0.1 - 1.0 K/uL   Eosinophils Relative 2 %   Eosinophils Absolute 0.1 0.0 - 0.7 K/uL   Basophils Relative 0 %   Basophils Absolute 0.0 0.0 - 0.1 K/uL  Basic metabolic panel  Result Value Ref Range   Sodium 132 (L) 135 - 145 mmol/L   Potassium 3.5 3.5 - 5.1 mmol/L   Chloride 98 (L) 101 - 111 mmol/L   CO2 26 22 - 32 mmol/L   Glucose, Bld 158 (H) 65 - 99 mg/dL   BUN 21 (H) 6 - 20 mg/dL   Creatinine, Ser 1.42 (H) 0.61 - 1.24 mg/dL   Calcium 8.1 (L) 8.9 - 10.3 mg/dL   GFR calc non Af Amer 47 (L) >60 mL/min   GFR calc Af Amer 55 (L) >60 mL/min   Anion gap 8 5 - 15  Magnesium  Result Value Ref Range   Magnesium 1.7 1.7 - 2.4 mg/dL  Phosphorus  Result Value Ref Range   Phosphorus 4.0 2.5 - 4.6 mg/dL  Glucose, capillary  Result Value Ref Range   Glucose-Capillary 123 (H) 65 - 99 mg/dL   Comment 1 Notify RN    Comment 2 Document in Chart   Glucose, capillary  Result Value Ref Range   Glucose-Capillary 133 (H) 65 - 99 mg/dL   Comment 1 Notify RN    Comment 2 Document in Chart   Glucose, capillary  Result Value Ref Range   Glucose-Capillary 126 (H) 65 - 99 mg/dL   Comment 1 Notify RN    Comment 2 Document in Chart   Glucose, capillary  Result Value Ref Range   Glucose-Capillary 164 (H) 65 - 99 mg/dL   Comment 1 Notify RN    Comment 2  Document in Chart   Glucose, capillary  Result Value Ref Range   Glucose-Capillary 154 (H) 65 - 99 mg/dL   Comment 1 Notify RN    Comment 2 Document in Chart   Glucose, capillary  Result Value Ref Range   Glucose-Capillary 147 (H) 65 - 99 mg/dL  Glucose, capillary  Result Value Ref Range   Glucose-Capillary 143 (H) 65 - 99 mg/dL  Basic metabolic panel  Result Value Ref Range   Sodium 134 (L) 135 - 145 mmol/L   Potassium 3.8 3.5 - 5.1 mmol/L   Chloride 98 (L) 101 - 111 mmol/L   CO2 28 22 - 32 mmol/L   Glucose, Bld 153 (H) 65 - 99 mg/dL   BUN 23 (H) 6 - 20 mg/dL    Creatinine, Ser 1.64 (H) 0.61 - 1.24 mg/dL   Calcium 8.2 (L) 8.9 - 10.3 mg/dL   GFR calc non Af Amer 40 (L) >60 mL/min   GFR calc Af Amer 46 (L) >60 mL/min   Anion gap 8 5 - 15  Magnesium  Result Value Ref Range   Magnesium 2.2 1.7 - 2.4 mg/dL  CBC with Differential/Platelet  Result Value Ref Range   WBC 5.8 4.0 - 10.5 K/uL   RBC 2.81 (L) 4.22 - 5.81 MIL/uL   Hemoglobin 8.0 (L) 13.0 - 17.0 g/dL   HCT 23.7 (L) 39.0 - 52.0 %   MCV 84.3 78.0 - 100.0 fL   MCH 28.5 26.0 - 34.0 pg   MCHC 33.8 30.0 - 36.0 g/dL   RDW 14.1 11.5 - 15.5 %   Platelets 188 150 - 400 K/uL   Neutrophils Relative % 88 %   Neutro Abs 5.1 1.7 - 7.7 K/uL   Lymphocytes Relative 5 %   Lymphs Abs 0.3 (L) 0.7 - 4.0 K/uL   Monocytes Relative 5 %   Monocytes Absolute 0.3 0.1 - 1.0 K/uL   Eosinophils Relative 2 %   Eosinophils Absolute 0.1 0.0 - 0.7 K/uL   Basophils Relative 0 %   Basophils Absolute 0.0 0.0 - 0.1 K/uL  Glucose, capillary  Result Value Ref Range   Glucose-Capillary 163 (H) 65 - 99 mg/dL   Comment 1 Notify RN    Comment 2 Document in Chart   Glucose, capillary  Result Value Ref Range   Glucose-Capillary 169 (H) 65 - 99 mg/dL   Comment 1 Notify RN    Comment 2 Document in Chart   Glucose, capillary  Result Value Ref Range   Glucose-Capillary 146 (H) 65 - 99 mg/dL  Glucose, capillary  Result Value Ref Range   Glucose-Capillary 138 (H) 65 - 99 mg/dL   Comment 1 Notify RN    Comment 2 Document in Chart   Glucose, capillary  Result Value Ref Range   Glucose-Capillary 137 (H) 65 - 99 mg/dL  Comprehensive metabolic panel  Result Value Ref Range   Sodium 136 135 - 145 mmol/L   Potassium 3.8 3.5 - 5.1 mmol/L   Chloride 98 (L) 101 - 111 mmol/L   CO2 29 22 - 32 mmol/L   Glucose, Bld 137 (H) 65 - 99 mg/dL   BUN 23 (H) 6 - 20 mg/dL   Creatinine, Ser 1.69 (H) 0.61 - 1.24 mg/dL   Calcium 8.3 (L) 8.9 - 10.3 mg/dL   Total Protein 5.7 (L) 6.5 - 8.1 g/dL   Albumin 1.8 (L) 3.5 - 5.0 g/dL   AST 27 15 -  41 U/L   ALT 34 17 -  63 U/L   Alkaline Phosphatase 267 (H) 38 - 126 U/L   Total Bilirubin 0.6 0.3 - 1.2 mg/dL   GFR calc non Af Amer 38 (L) >60 mL/min   GFR calc Af Amer 45 (L) >60 mL/min   Anion gap 9 5 - 15  Magnesium  Result Value Ref Range   Magnesium 1.8 1.7 - 2.4 mg/dL  Phosphorus  Result Value Ref Range   Phosphorus 3.9 2.5 - 4.6 mg/dL  CBC with Differential/Platelet  Result Value Ref Range   WBC 6.6 4.0 - 10.5 K/uL   RBC 2.96 (L) 4.22 - 5.81 MIL/uL   Hemoglobin 8.4 (L) 13.0 - 17.0 g/dL   HCT 25.1 (L) 39.0 - 52.0 %   MCV 84.8 78.0 - 100.0 fL   MCH 28.4 26.0 - 34.0 pg   MCHC 33.5 30.0 - 36.0 g/dL   RDW 13.9 11.5 - 15.5 %   Platelets 206 150 - 400 K/uL   Neutrophils Relative % 82 %   Neutro Abs 5.4 1.7 - 7.7 K/uL   Lymphocytes Relative 10 %   Lymphs Abs 0.6 (L) 0.7 - 4.0 K/uL   Monocytes Relative 6 %   Monocytes Absolute 0.4 0.1 - 1.0 K/uL   Eosinophils Relative 2 %   Eosinophils Absolute 0.1 0.0 - 0.7 K/uL   Basophils Relative 0 %   Basophils Absolute 0.0 0.0 - 0.1 K/uL  Glucose, capillary  Result Value Ref Range   Glucose-Capillary 129 (H) 65 - 99 mg/dL  Glucose, capillary  Result Value Ref Range   Glucose-Capillary 125 (H) 65 - 99 mg/dL  Glucose, capillary  Result Value Ref Range   Glucose-Capillary 150 (H) 65 - 99 mg/dL  Glucose, capillary  Result Value Ref Range   Glucose-Capillary 141 (H) 65 - 99 mg/dL  Glucose, capillary  Result Value Ref Range   Glucose-Capillary 118 (H) 65 - 99 mg/dL  Basic metabolic panel  Result Value Ref Range   Sodium 133 (L) 135 - 145 mmol/L   Potassium 4.1 3.5 - 5.1 mmol/L   Chloride 94 (L) 101 - 111 mmol/L   CO2 29 22 - 32 mmol/L   Glucose, Bld 138 (H) 65 - 99 mg/dL   BUN 25 (H) 6 - 20 mg/dL   Creatinine, Ser 1.84 (H) 0.61 - 1.24 mg/dL   Calcium 8.4 (L) 8.9 - 10.3 mg/dL   GFR calc non Af Amer 35 (L) >60 mL/min   GFR calc Af Amer 40 (L) >60 mL/min   Anion gap 10 5 - 15  Magnesium  Result Value Ref Range   Magnesium  2.0 1.7 - 2.4 mg/dL  Phosphorus  Result Value Ref Range   Phosphorus 3.9 2.5 - 4.6 mg/dL  Glucose, capillary  Result Value Ref Range   Glucose-Capillary 130 (H) 65 - 99 mg/dL  Glucose, capillary  Result Value Ref Range   Glucose-Capillary 149 (H) 65 - 99 mg/dL  Glucose, capillary  Result Value Ref Range   Glucose-Capillary 154 (H) 65 - 99 mg/dL  Glucose, capillary  Result Value Ref Range   Glucose-Capillary 132 (H) 65 - 99 mg/dL  CBC with Differential/Platelet  Result Value Ref Range   WBC 6.6 4.0 - 10.5 K/uL   RBC 2.98 (L) 4.22 - 5.81 MIL/uL   Hemoglobin 8.4 (L) 13.0 - 17.0 g/dL   HCT 25.3 (L) 39.0 - 52.0 %   MCV 84.9 78.0 - 100.0 fL   MCH 28.2 26.0 - 34.0 pg   MCHC 33.2 30.0 -  36.0 g/dL   RDW 14.0 11.5 - 15.5 %   Platelets 228 150 - 400 K/uL   Neutrophils Relative % 78 %   Neutro Abs 5.2 1.7 - 7.7 K/uL   Lymphocytes Relative 11 %   Lymphs Abs 0.7 0.7 - 4.0 K/uL   Monocytes Relative 7 %   Monocytes Absolute 0.5 0.1 - 1.0 K/uL   Eosinophils Relative 4 %   Eosinophils Absolute 0.3 0.0 - 0.7 K/uL   Basophils Relative 0 %   Basophils Absolute 0.0 0.0 - 0.1 K/uL  Comprehensive metabolic panel  Result Value Ref Range   Sodium 133 (L) 135 - 145 mmol/L   Potassium 4.4 3.5 - 5.1 mmol/L   Chloride 95 (L) 101 - 111 mmol/L   CO2 28 22 - 32 mmol/L   Glucose, Bld 121 (H) 65 - 99 mg/dL   BUN 32 (H) 6 - 20 mg/dL   Creatinine, Ser 2.04 (H) 0.61 - 1.24 mg/dL   Calcium 8.5 (L) 8.9 - 10.3 mg/dL   Total Protein 5.9 (L) 6.5 - 8.1 g/dL   Albumin 2.1 (L) 3.5 - 5.0 g/dL   AST 25 15 - 41 U/L   ALT 25 17 - 63 U/L   Alkaline Phosphatase 244 (H) 38 - 126 U/L   Total Bilirubin 0.5 0.3 - 1.2 mg/dL   GFR calc non Af Amer 31 (L) >60 mL/min   GFR calc Af Amer 36 (L) >60 mL/min   Anion gap 10 5 - 15  Glucose, capillary  Result Value Ref Range   Glucose-Capillary 120 (H) 65 - 99 mg/dL  Glucose, capillary  Result Value Ref Range   Glucose-Capillary 146 (H) 65 - 99 mg/dL  Vancomycin, trough   Result Value Ref Range   Vancomycin Tr 14 (L) 15 - 20 ug/mL  Glucose, capillary  Result Value Ref Range   Glucose-Capillary 140 (H) 65 - 99 mg/dL  Comprehensive metabolic panel  Result Value Ref Range   Sodium 134 (L) 135 - 145 mmol/L   Potassium 4.4 3.5 - 5.1 mmol/L   Chloride 98 (L) 101 - 111 mmol/L   CO2 28 22 - 32 mmol/L   Glucose, Bld 126 (H) 65 - 99 mg/dL   BUN 34 (H) 6 - 20 mg/dL   Creatinine, Ser 2.28 (H) 0.61 - 1.24 mg/dL   Calcium 8.8 (L) 8.9 - 10.3 mg/dL   Total Protein 6.1 (L) 6.5 - 8.1 g/dL   Albumin 2.2 (L) 3.5 - 5.0 g/dL   AST 25 15 - 41 U/L   ALT 25 17 - 63 U/L   Alkaline Phosphatase 235 (H) 38 - 126 U/L   Total Bilirubin 0.9 0.3 - 1.2 mg/dL   GFR calc non Af Amer 27 (L) >60 mL/min   GFR calc Af Amer 31 (L) >60 mL/min   Anion gap 8 5 - 15  Glucose, capillary  Result Value Ref Range   Glucose-Capillary 136 (H) 65 - 99 mg/dL  Glucose, capillary  Result Value Ref Range   Glucose-Capillary 137 (H) 65 - 99 mg/dL  Glucose, capillary  Result Value Ref Range   Glucose-Capillary 138 (H) 65 - 99 mg/dL  Glucose, capillary  Result Value Ref Range   Glucose-Capillary 143 (H) 65 - 99 mg/dL  Comprehensive metabolic panel  Result Value Ref Range   Sodium 134 (L) 135 - 145 mmol/L   Potassium 4.5 3.5 - 5.1 mmol/L   Chloride 96 (L) 101 - 111 mmol/L   CO2 27 22 -  32 mmol/L   Glucose, Bld 130 (H) 65 - 99 mg/dL   BUN 47 (H) 6 - 20 mg/dL   Creatinine, Ser 2.45 (H) 0.61 - 1.24 mg/dL   Calcium 9.0 8.9 - 10.3 mg/dL   Total Protein 7.1 6.5 - 8.1 g/dL   Albumin 2.3 (L) 3.5 - 5.0 g/dL   AST 26 15 - 41 U/L   ALT 26 17 - 63 U/L   Alkaline Phosphatase 227 (H) 38 - 126 U/L   Total Bilirubin 0.7 0.3 - 1.2 mg/dL   GFR calc non Af Amer 25 (L) >60 mL/min   GFR calc Af Amer 28 (L) >60 mL/min   Anion gap 11 5 - 15  Magnesium  Result Value Ref Range   Magnesium 2.1 1.7 - 2.4 mg/dL  Phosphorus  Result Value Ref Range   Phosphorus 4.8 (H) 2.5 - 4.6 mg/dL  CBC  Result Value Ref  Range   WBC 8.3 4.0 - 10.5 K/uL   RBC 3.53 (L) 4.22 - 5.81 MIL/uL   Hemoglobin 9.4 (L) 13.0 - 17.0 g/dL   HCT 29.8 (L) 39.0 - 52.0 %   MCV 84.4 78.0 - 100.0 fL   MCH 26.6 26.0 - 34.0 pg   MCHC 31.5 30.0 - 36.0 g/dL   RDW 14.5 11.5 - 15.5 %   Platelets 299 150 - 400 K/uL  Differential  Result Value Ref Range   Neutrophils Relative % 79 %   Neutro Abs 6.6 1.7 - 7.7 K/uL   Lymphocytes Relative 10 %   Lymphs Abs 0.8 0.7 - 4.0 K/uL   Monocytes Relative 7 %   Monocytes Absolute 0.6 0.1 - 1.0 K/uL   Eosinophils Relative 3 %   Eosinophils Absolute 0.3 0.0 - 0.7 K/uL   Basophils Relative 1 %   Basophils Absolute 0.1 0.0 - 0.1 K/uL  Triglycerides  Result Value Ref Range   Triglycerides 143 <150 mg/dL  Prealbumin  Result Value Ref Range   Prealbumin 28.9 18 - 38 mg/dL  Glucose, capillary  Result Value Ref Range   Glucose-Capillary 132 (H) 65 - 99 mg/dL  Glucose, capillary  Result Value Ref Range   Glucose-Capillary 148 (H) 65 - 99 mg/dL  Glucose, capillary  Result Value Ref Range   Glucose-Capillary 141 (H) 65 - 99 mg/dL  Glucose, capillary  Result Value Ref Range   Glucose-Capillary 154 (H) 65 - 99 mg/dL  Basic metabolic panel  Result Value Ref Range   Sodium 135 135 - 145 mmol/L   Potassium 4.7 3.5 - 5.1 mmol/L   Chloride 101 101 - 111 mmol/L   CO2 25 22 - 32 mmol/L   Glucose, Bld 120 (H) 65 - 99 mg/dL   BUN 49 (H) 6 - 20 mg/dL   Creatinine, Ser 2.28 (H) 0.61 - 1.24 mg/dL   Calcium 8.8 (L) 8.9 - 10.3 mg/dL   GFR calc non Af Amer 27 (L) >60 mL/min   GFR calc Af Amer 31 (L) >60 mL/min   Anion gap 9 5 - 15  Glucose, capillary  Result Value Ref Range   Glucose-Capillary 140 (H) 65 - 99 mg/dL  Glucose, capillary  Result Value Ref Range   Glucose-Capillary 140 (H) 65 - 99 mg/dL  Glucose, capillary  Result Value Ref Range   Glucose-Capillary 155 (H) 65 - 99 mg/dL  Glucose, capillary  Result Value Ref Range   Glucose-Capillary 144 (H) 65 - 99 mg/dL  Basic metabolic  panel  Result Value Ref Range  Sodium 136 135 - 145 mmol/L   Potassium 5.4 (H) 3.5 - 5.1 mmol/L   Chloride 104 101 - 111 mmol/L   CO2 25 22 - 32 mmol/L   Glucose, Bld 106 (H) 65 - 99 mg/dL   BUN 43 (H) 6 - 20 mg/dL   Creatinine, Ser 1.99 (H) 0.61 - 1.24 mg/dL   Calcium 8.9 8.9 - 10.3 mg/dL   GFR calc non Af Amer 32 (L) >60 mL/min   GFR calc Af Amer 37 (L) >60 mL/min   Anion gap 7 5 - 15  Glucose, capillary  Result Value Ref Range   Glucose-Capillary 123 (H) 65 - 99 mg/dL  Glucose, capillary  Result Value Ref Range   Glucose-Capillary 107 (H) 65 - 99 mg/dL  Glucose, capillary  Result Value Ref Range   Glucose-Capillary 127 (H) 65 - 99 mg/dL  Comprehensive metabolic panel  Result Value Ref Range   Sodium 136 135 - 145 mmol/L   Potassium 4.7 3.5 - 5.1 mmol/L   Chloride 106 101 - 111 mmol/L   CO2 23 22 - 32 mmol/L   Glucose, Bld 118 (H) 65 - 99 mg/dL   BUN 34 (H) 6 - 20 mg/dL   Creatinine, Ser 1.87 (H) 0.61 - 1.24 mg/dL   Calcium 8.8 (L) 8.9 - 10.3 mg/dL   Total Protein 6.2 (L) 6.5 - 8.1 g/dL   Albumin 2.3 (L) 3.5 - 5.0 g/dL   AST 24 15 - 41 U/L   ALT 21 17 - 63 U/L   Alkaline Phosphatase 179 (H) 38 - 126 U/L   Total Bilirubin 0.6 0.3 - 1.2 mg/dL   GFR calc non Af Amer 34 (L) >60 mL/min   GFR calc Af Amer 39 (L) >60 mL/min   Anion gap 7 5 - 15  Magnesium  Result Value Ref Range   Magnesium 1.7 1.7 - 2.4 mg/dL  Phosphorus  Result Value Ref Range   Phosphorus 3.6 2.5 - 4.6 mg/dL  CBC with Differential/Platelet  Result Value Ref Range   WBC 9.1 4.0 - 10.5 K/uL   RBC 3.23 (L) 4.22 - 5.81 MIL/uL   Hemoglobin 9.0 (L) 13.0 - 17.0 g/dL   HCT 28.1 (L) 39.0 - 52.0 %   MCV 87.0 78.0 - 100.0 fL   MCH 27.9 26.0 - 34.0 pg   MCHC 32.0 30.0 - 36.0 g/dL   RDW 15.6 (H) 11.5 - 15.5 %   Platelets 264 150 - 400 K/uL   Neutrophils Relative % 83 %   Neutro Abs 7.5 1.7 - 7.7 K/uL   Lymphocytes Relative 7 %   Lymphs Abs 0.7 0.7 - 4.0 K/uL   Monocytes Relative 8 %   Monocytes  Absolute 0.7 0.1 - 1.0 K/uL   Eosinophils Relative 2 %   Eosinophils Absolute 0.2 0.0 - 0.7 K/uL   Basophils Relative 0 %   Basophils Absolute 0.0 0.0 - 0.1 K/uL  Glucose, capillary  Result Value Ref Range   Glucose-Capillary 126 (H) 65 - 99 mg/dL  Glucose, capillary  Result Value Ref Range   Glucose-Capillary 130 (H) 65 - 99 mg/dL  Glucose, capillary  Result Value Ref Range   Glucose-Capillary 122 (H) 65 - 99 mg/dL  Glucose, capillary  Result Value Ref Range   Glucose-Capillary 109 (H) 65 - 99 mg/dL  Glucose, capillary  Result Value Ref Range   Glucose-Capillary 109 (H) 65 - 99 mg/dL  Glucose, capillary  Result Value Ref Range   Glucose-Capillary 116 (H)  65 - 99 mg/dL  Basic metabolic panel  Result Value Ref Range   Sodium 135 135 - 145 mmol/L   Potassium 4.1 3.5 - 5.1 mmol/L   Chloride 105 101 - 111 mmol/L   CO2 23 22 - 32 mmol/L   Glucose, Bld 140 (H) 65 - 99 mg/dL   BUN 29 (H) 6 - 20 mg/dL   Creatinine, Ser 1.62 (H) 0.61 - 1.24 mg/dL   Calcium 8.9 8.9 - 10.3 mg/dL   GFR calc non Af Amer 40 (L) >60 mL/min   GFR calc Af Amer 47 (L) >60 mL/min   Anion gap 7 5 - 15  Glucose, capillary  Result Value Ref Range   Glucose-Capillary 102 (H) 65 - 99 mg/dL  Glucose, capillary  Result Value Ref Range   Glucose-Capillary 132 (H) 65 - 99 mg/dL  Glucose, capillary  Result Value Ref Range   Glucose-Capillary 119 (H) 65 - 99 mg/dL  Basic metabolic panel  Result Value Ref Range   Sodium 136 135 - 145 mmol/L   Potassium 4.7 3.5 - 5.1 mmol/L   Chloride 106 101 - 111 mmol/L   CO2 22 22 - 32 mmol/L   Glucose, Bld 109 (H) 65 - 99 mg/dL   BUN 30 (H) 6 - 20 mg/dL   Creatinine, Ser 1.55 (H) 0.61 - 1.24 mg/dL   Calcium 9.1 8.9 - 10.3 mg/dL   GFR calc non Af Amer 43 (L) >60 mL/min   GFR calc Af Amer 50 (L) >60 mL/min   Anion gap 8 5 - 15  Glucose, capillary  Result Value Ref Range   Glucose-Capillary 152 (H) 65 - 99 mg/dL  Glucose, capillary  Result Value Ref Range    Glucose-Capillary 108 (H) 65 - 99 mg/dL  Basic metabolic panel  Result Value Ref Range   Sodium 137 135 - 145 mmol/L   Potassium 5.0 3.5 - 5.1 mmol/L   Chloride 108 101 - 111 mmol/L   CO2 22 22 - 32 mmol/L   Glucose, Bld 94 65 - 99 mg/dL   BUN 29 (H) 6 - 20 mg/dL   Creatinine, Ser 1.58 (H) 0.61 - 1.24 mg/dL   Calcium 8.8 (L) 8.9 - 10.3 mg/dL   GFR calc non Af Amer 42 (L) >60 mL/min   GFR calc Af Amer 48 (L) >60 mL/min   Anion gap 7 5 - 15  Glucose, capillary  Result Value Ref Range   Glucose-Capillary 104 (H) 65 - 99 mg/dL  Glucose, capillary  Result Value Ref Range   Glucose-Capillary 99 65 - 99 mg/dL  Glucose, capillary  Result Value Ref Range   Glucose-Capillary 90 65 - 99 mg/dL  Glucose, capillary  Result Value Ref Range   Glucose-Capillary 84 65 - 99 mg/dL  Basic metabolic panel  Result Value Ref Range   Sodium 137 135 - 145 mmol/L   Potassium 4.7 3.5 - 5.1 mmol/L   Chloride 109 101 - 111 mmol/L   CO2 21 (L) 22 - 32 mmol/L   Glucose, Bld 97 65 - 99 mg/dL   BUN 29 (H) 6 - 20 mg/dL   Creatinine, Ser 1.65 (H) 0.61 - 1.24 mg/dL   Calcium 9.0 8.9 - 10.3 mg/dL   GFR calc non Af Amer 40 (L) >60 mL/min   GFR calc Af Amer 46 (L) >60 mL/min   Anion gap 7 5 - 15  Glucose, capillary  Result Value Ref Range   Glucose-Capillary 103 (H) 65 - 99 mg/dL  Prealbumin  Result Value Ref Range   Prealbumin 27.2 18 - 38 mg/dL  Glucose, capillary  Result Value Ref Range   Glucose-Capillary 99 65 - 99 mg/dL  Glucose, capillary  Result Value Ref Range   Glucose-Capillary 108 (H) 65 - 99 mg/dL  I-stat troponin, ED  Result Value Ref Range   Troponin i, poc 0.00 0.00 - 0.08 ng/mL   Comment 3          Type and screen [place]  Result Value Ref Range   ABO/RH(D) O POS    Antibody Screen NEG    Sample Expiration 08/30/2016    Unit Number [id number]    Blood Component Type RED CELLS,LR    Unit division 00    Status of Unit ISSUED,FINAL    Transfusion Status  OK TO TRANSFUSE    Crossmatch Result Compatible    Unit Number [id number]    Blood Component Type RED CELLS,LR    Unit division 00    Status of Unit ISSUED,FINAL    Transfusion Status OK TO TRANSFUSE    Crossmatch Result Compatible   Prepare RBC  Result Value Ref Range   Order Confirmation ORDER PROCESSED BY BLOOD BANK   Transfusion reaction  Result Value Ref Range   Post RXN DAT IgG NEG    DAT C3 NEG    Path interp tx rxn PENDING   Type and screen [facility]  Result Value Ref Range   ISSUE DATE / TIME [id number]    Blood Product Unit Number [id number]    Unit Type and Rh 5100    Blood Product Expiration Date [id number]   Prepare RBC  Result Value Ref Range   Order Confirmation ORDER PROCESSED BY BLOOD BANK     Ct Abdomen Pelvis Wo Contrast  Result Date: 09/03/2016 CLINICAL DATA:  Colonic ischemia status post right hemicolectomy. Evaluate for fluid collections. EXAM: CT ABDOMEN AND PELVIS WITHOUT CONTRAST TECHNIQUE: Multidetector CT imaging of the abdomen and pelvis was performed following the standard protocol without IV contrast. COMPARISON:  08/28/2015 FINDINGS: Lower chest: Subsegmental atelectasis at the bases. Cardiomegaly and extensive atherosclerosis. Dilated ascending aorta measuring at least 47 mm on coronal reformats at the sino-tubular junction. Hepatobiliary: No focal liver abnormality.Cholecystectomy. Pancreas: Unremarkable. Spleen: Stable mild enlargement. Adrenals/Urinary Tract: Negative adrenals. Small bilateral renal calculi, at least 3 on the right measuring up to 3 mm. No hydronephrosis or ureteral calculus. 2 cm presumed right renal cyst. Negative bladder. Stomach/Bowel: Right hemicolectomy. Hartman's pouch at the distal transverse colon. There is oral contrast still within the nondilated remaining colon related to remote scan. Right lower quadrant ileostomy. Oral contrast reached the ileostomy bag. No obstruction or ileus. No suspected  inflammatory wall thickening. There is generalized peritoneal thickening which is commonly seen postoperatively. Small ascites seen in the left pericolic gutter without suspected thick rim or overt loculation. Small volume pneumoperitoneum, expected. Surgical drain in place in the colectomy bed. Vascular/Lymphatic: No acute vascular abnormality. No mass or adenopathy. Reproductive:No acute finding. Other: Recent laparotomy incision. Superficially the incision is opened. No abdominal wall fluid collection. Musculoskeletal: No acute abnormalities. IMPRESSION: 1. Findings in keeping with recent right hemicolectomy. Small volume pneumoperitoneum and ascites without overt loculation. Peritoneal thickening and omental edema greatest in the right upper quadrant. 2. Oral contrast reaches the ileostomy bag. 3. Nephrolithiasis. 4. Aneurysmal ascending aorta measuring up to 47 mm. Recommend semi-annual imaging followup by CTA or MRA and referral to cardiothoracic surgery if not already obtained.  This recommendation follows 2010 ACCF/AHA/AATS/ACR/ASA/SCA/SCAI/SIR/STS/SVM Guidelines for the Diagnosis and Management of Patients With Thoracic Aortic Disease. Circulation. 2010; 121: N989-Q119 Electronically Signed   By: Monte Fantasia M.D.   On: 09/03/2016 12:41   Ct Abdomen Pelvis Wo Contrast  Result Date: 08/27/2016 CLINICAL DATA:  History of recent colon surgery 13 days ago with abdominal pain EXAM: CT ABDOMEN AND PELVIS WITHOUT CONTRAST TECHNIQUE: Multidetector CT imaging of the abdomen and pelvis was performed following the standard protocol without IV contrast. COMPARISON:  05/11/2016 FINDINGS: Lower chest: Lung bases are well aerated. Stable nodule is noted along the major fissure on the left unchanged from the prior exam. Mild scarring is noted in the bases bilaterally. Coronary calcifications are seen. Hepatobiliary: The liver is within normal limits. The gallbladder has been surgically removed. Pancreas:  Unremarkable. No pancreatic ductal dilatation or surrounding inflammatory changes. Spleen: Normal in size without focal abnormality. Adrenals/Urinary Tract: Adrenals are within normal limits bilaterally. Bilateral nonobstructing renal calculi are seen. Small renal cysts are noted bilaterally stable from the prior exam. Stomach/Bowel: Postsurgical changes are noted in the midportion of the transverse colon consistent with the given clinical history. There is a large amount of free air and apparent fecal material extrinsic to the bowel in the right mid abdomen. Considerable free air is noted as well. Changes are consistent with anastomotic breakdown. Some mild extravasated contrast is noted in the right lateral abdomen best seen on image number [id number]. Vascular/Lymphatic: Aortic atherosclerosis. No enlarged abdominal or pelvic lymph nodes. Reproductive: Prostate is unremarkable. Other: No abdominal wall hernia or abnormality. No abdominopelvic ascites. Musculoskeletal: Degenerative changes of lumbar spine are noted. IMPRESSION: Changes consistent with breakdown of recent surgical anastomosis in the right upper abdomen with spillage of both air and apparent fecal material as well as contrast in to the peritoneum. These findings were discussed with the referring surgeon Dr. [name] at the time of exam interpretation. Stable nonobstructing renal calculi Stable nodule in the left lower lobe Electronically Signed   By: Inez Catalina M.D.   On: 08/27/2016 15:17   X-ray Chest Pa Or Ap  Result Date: 08/27/2016 CLINICAL DATA:  Central line placement. EXAM: CHEST 1 VIEW COMPARISON:  Earlier today. FINDINGS: Interval enlargement of the cardiac silhouette. Resolved free peritoneal air. Mild bibasilar atelectasis. Interval right jugular catheter with its tip at the superior cavoatrial junction. No pneumothorax. Aortic arch calcification. Diffuse osteopenia, thoracic spine degenerative changes and left humeral head prosthesis.  IMPRESSION: 1. Right jugular catheter tip at the superior cavoatrial junction without pneumothorax. 2. Interval cardiomegaly and mild bibasilar atelectasis. 3. Aortic atherosclerosis. Electronically Signed   By: Claudie Revering M.D.   On: 08/27/2016 21:03   Dg Chest 2 View  Result Date: 08/27/2016 CLINICAL DATA:  Cough, congestion, recent colon section 08/14/2016 EXAM: CHEST  2 VIEW COMPARISON:  05/30/2016 FINDINGS: Large amount of pneumoperitoneum beneath the hemidiaphragms. Mild cardiomegaly with basilar atelectasis. No current edema, effusion, or pneumothorax. Trachea is midline. Atherosclerosis noted of the aorta and degenerative changes of the spine. Previous left shoulder hemiarthroplasty partially imaged. IMPRESSION: Large amount of pneumoperitoneum, more than would be expected at 13 days postop. Cardiomegaly and basilar atelectasis Thoracic aortic atherosclerosis These results were called by telephone at the time of interpretation on 08/27/2016 at 8:56 am to Dr. Sharilyn Sites , who verbally acknowledged these results. Electronically Signed   By: Jerilynn Mages.  Shick M.D.   On: 08/27/2016 08:57   Dg Chest Port 1 View  Result Date: 08/29/2016 CLINICAL DATA:  76 year old  male with possible pneumonia. EXAM: PORTABLE CHEST 1 VIEW COMPARISON:  Chest radiograph dated 08/27/2016 FINDINGS: Right IJ central line with tip over the right atrium in stable positioning. There is shallow inspiration. Bibasilar, left greater right, linear atelectasis/scarring as seen on the prior radiograph. There is no focal consolidation, pleural effusion, or pneumothorax. Stable cardiomegaly. No vascular congestion or edema. Degenerative changes of the spine. Left shoulder hemiarthroplasty. No acute fracture. IMPRESSION: No focal consolidation.  No interval change. Electronically Signed   By: Anner Crete M.D.   On: 08/29/2016 06:12    Discharge Exam:  Vitals:   09/13/16 2017 09/14/16 0446  BP: 102/69 97/62  Pulse: 73 69  Resp: 19 18   Temp: 97.5 F (36.4 C) 97.8 F (36.6 C)    General: WN older WM who is alert.  Though still sickly looking, he looks better that last week. Lungs: Clear to auscultation and symmetric breath sounds. Heart:  RRR. No murmur or rub. Abdomen: Soft. No mass. No hernia. Normal bowel sounds.   Wound clean.  His ostomy is in the RUQ.  Discharge Medications:   Allergies as of 09/14/2016      Reactions   Lorazepam Other (See Comments)   Reaction:  Hallucinations    Humira [adalimumab] Other (See Comments)   Pt states that he got pancreatitis.        Medication List    TAKE these medications   Acidophilus Lactobacillus Caps Take 1 capsule by mouth daily. What changed:  when to take this   amiodarone 200 MG tablet Commonly known as:  PACERONE Take 1 tablet (200 mg total) by mouth 2 (two) times daily.   calcium carbonate 500 MG chewable tablet Commonly known as:  TUMS - dosed in mg elemental calcium Chew 2 tablets by mouth 2 (two) times daily as needed.   cyanocobalamin 1000 MCG/ML injection Commonly known as:  (VITAMIN B-12) Inject 1,000 mcg into the muscle every 14 (fourteen) days.   cycloSPORINE 0.05 % ophthalmic emulsion Commonly known as:  RESTASIS Place 1 drop into both eyes 2 (two) times daily.   ELIQUIS 5 MG Tabs tablet Generic drug:  apixaban Take 5 mg by mouth 2 (two) times daily.   febuxostat 40 MG tablet Commonly known as:  ULORIC Take 80 mg by mouth daily.   gabapentin 100 MG capsule Commonly known as:  NEURONTIN Take 100-200 mg by mouth 2 (two) times daily. Pt takes one tablet in the morning and two at night.   HYDROcodone-acetaminophen 5-325 MG tablet Commonly known as:  NORCO/VICODIN Take 1-2 tablets by mouth every 6 (six) hours as needed for moderate pain.   Magnesium Oxide 420 MG Tabs Take 420 mg by mouth 2 (two) times daily.   minocycline 100 MG capsule Commonly known as:  MINOCIN,DYNACIN Take 100 mg by mouth daily as needed (when breakout occurs  on face).   multivitamin with minerals Tabs tablet Take 1 tablet by mouth daily.   omeprazole 20 MG capsule Commonly known as:  PRILOSEC Take 20 mg by mouth daily.   PARoxetine 20 MG tablet Commonly known as:  PAXIL Take 20 mg by mouth at bedtime.   potassium citrate 10 MEQ (1080 MG) SR tablet Commonly known as:  UROCIT-K Take 10 mEq by mouth 2 (two) times daily.   promethazine 25 MG tablet Commonly known as:  PHENERGAN Take 25 mg by mouth every 8 (eight) hours as needed for nausea or vomiting.   REFRESH OP Apply 1 drop to eye 2 (  two) times daily.   saccharomyces boulardii 250 MG capsule Commonly known as:  FLORASTOR Take 1 capsule (250 mg total) by mouth 2 (two) times daily.   sodium chloride 0.9 % infusion Inject 2,000 mLs into the vein 1 day or 1 dose. Give 2,000 cc per day - this could be given as 200 cc per hour x 10.  The patient has a PICC.  Let me know if there is a question   terazosin 2 MG capsule Commonly known as:  HYTRIN Take 2 mg by mouth at bedtime.   traZODone 150 MG tablet Commonly known as:  DESYREL Take 150 mg by mouth at bedtime.   Vitamin D3 1000 units Caps Take 1,000 Units by mouth daily.            Durable Medical Equipment        Start     Ordered   09/14/16 1210  For home use only DME Walker rolling  Once    Question:  Patient needs a walker to treat with the following condition  Answer:  Balance problems   09/14/16 1210   09/11/16 1002  For home use only DME Hospital bed  Once    Question Answer Comment  Patient has (list medical condition): ileostomy, open wound, deconditioned   Bed type Semi-electric      09/11/16 1010      Disposition: 50-Hospice/Home  Discharge Instructions    Call MD for:  persistant nausea and vomiting    Complete by:  As directed    Call MD for:  temperature >100.4    Complete by:  As directed    Diet - low sodium heart healthy    Complete by:  As directed    Increase activity slowly    Complete  by:  As directed      Activity:  Be as active as possible  Wound Care:   Change dressing once a day  Diet:  As tolerated  Follow up appointment:  Call Dr. Pollie Friar office [facility]) at [phone] for an appointment in 2 weeks  Medications and dosages:  Resume your home medications.  You have a prescription for:  Imodium, Iron, and Metamucil    Signed: [name], M.D., [facility] Office:  [phone]  09/14/2016, 3:49 PM

## 2016-09-14 NOTE — Consult Note (Signed)
[facility] Nurse ostomy follow up:  Day of discharge Stoma type/location: RLQ ileostomy Stomal assessment/size: 1 and 3/8 inches oval, red, moist, visualized through transparent pouch Peristomal assessment: not seen today Treatment options for stomal/peristomal skin: skin barrier ring, wife used powder on last pouch change as instructed (2/10) Output: dark brown liquid effluent Ostomy pouching: 1pc.convex ostomy pouching system with skin barrier ring Education provided: Patient and wife provided today with ostomy appliance belt and a spare. Patient would like to wear on the way home. Reviewed instructions for showering, changing pouch in am to be most successful, emptying over toilet or sitting on toilet as he begins to assume responsibility for this. Provided with 3 more pouches and 3 rings. Nervous, but looking forward to returning home. Instructed to initiate supply ordering when he returns home with assistance from either [employer] personnel or [facility]. Patient will follow up with surgeon per instructions. Enrolled patient in [place] Start Discharge program: Yes. Supplies have already been received at patient's home. [place] nursing team will remain available to this patient, the nursing, surgical land medical teams. Thanks, Maudie Flakes, MSN, RN, [place], Arther Abbott  Pager# [phone]

## 2016-09-15 DIAGNOSIS — I4891 Unspecified atrial fibrillation: Secondary | ICD-10-CM | POA: Diagnosis not present

## 2016-09-15 DIAGNOSIS — Z96612 Presence of left artificial shoulder joint: Secondary | ICD-10-CM | POA: Diagnosis not present

## 2016-09-15 DIAGNOSIS — Z7901 Long term (current) use of anticoagulants: Secondary | ICD-10-CM | POA: Diagnosis not present

## 2016-09-15 DIAGNOSIS — Z85828 Personal history of other malignant neoplasm of skin: Secondary | ICD-10-CM | POA: Diagnosis not present

## 2016-09-15 DIAGNOSIS — F4310 Post-traumatic stress disorder, unspecified: Secondary | ICD-10-CM | POA: Diagnosis not present

## 2016-09-15 DIAGNOSIS — Z48815 Encounter for surgical aftercare following surgery on the digestive system: Secondary | ICD-10-CM | POA: Diagnosis not present

## 2016-09-15 DIAGNOSIS — E538 Deficiency of other specified B group vitamins: Secondary | ICD-10-CM | POA: Diagnosis not present

## 2016-09-15 DIAGNOSIS — Z452 Encounter for adjustment and management of vascular access device: Secondary | ICD-10-CM | POA: Diagnosis not present

## 2016-09-15 DIAGNOSIS — Z87891 Personal history of nicotine dependence: Secondary | ICD-10-CM | POA: Diagnosis not present

## 2016-09-15 DIAGNOSIS — I129 Hypertensive chronic kidney disease with stage 1 through stage 4 chronic kidney disease, or unspecified chronic kidney disease: Secondary | ICD-10-CM | POA: Diagnosis not present

## 2016-09-15 DIAGNOSIS — Z432 Encounter for attention to ileostomy: Secondary | ICD-10-CM | POA: Diagnosis not present

## 2016-09-15 DIAGNOSIS — N183 Chronic kidney disease, stage 3 (moderate): Secondary | ICD-10-CM | POA: Diagnosis not present

## 2016-09-15 DIAGNOSIS — F419 Anxiety disorder, unspecified: Secondary | ICD-10-CM | POA: Diagnosis not present

## 2016-09-15 DIAGNOSIS — G629 Polyneuropathy, unspecified: Secondary | ICD-10-CM | POA: Diagnosis not present

## 2016-09-15 DIAGNOSIS — K5090 Crohn's disease, unspecified, without complications: Secondary | ICD-10-CM | POA: Diagnosis not present

## 2016-09-15 DIAGNOSIS — F329 Major depressive disorder, single episode, unspecified: Secondary | ICD-10-CM | POA: Diagnosis not present

## 2016-09-16 DIAGNOSIS — K5090 Crohn's disease, unspecified, without complications: Secondary | ICD-10-CM | POA: Diagnosis not present

## 2016-09-16 DIAGNOSIS — Z452 Encounter for adjustment and management of vascular access device: Secondary | ICD-10-CM | POA: Diagnosis not present

## 2016-09-16 DIAGNOSIS — I129 Hypertensive chronic kidney disease with stage 1 through stage 4 chronic kidney disease, or unspecified chronic kidney disease: Secondary | ICD-10-CM | POA: Diagnosis not present

## 2016-09-16 DIAGNOSIS — N183 Chronic kidney disease, stage 3 (moderate): Secondary | ICD-10-CM | POA: Diagnosis not present

## 2016-09-16 DIAGNOSIS — Z432 Encounter for attention to ileostomy: Secondary | ICD-10-CM | POA: Diagnosis not present

## 2016-09-16 DIAGNOSIS — Z48815 Encounter for surgical aftercare following surgery on the digestive system: Secondary | ICD-10-CM | POA: Diagnosis not present

## 2016-09-17 DIAGNOSIS — Z48815 Encounter for surgical aftercare following surgery on the digestive system: Secondary | ICD-10-CM | POA: Diagnosis not present

## 2016-09-17 DIAGNOSIS — Z432 Encounter for attention to ileostomy: Secondary | ICD-10-CM | POA: Diagnosis not present

## 2016-09-17 DIAGNOSIS — I129 Hypertensive chronic kidney disease with stage 1 through stage 4 chronic kidney disease, or unspecified chronic kidney disease: Secondary | ICD-10-CM | POA: Diagnosis not present

## 2016-09-17 DIAGNOSIS — K5090 Crohn's disease, unspecified, without complications: Secondary | ICD-10-CM | POA: Diagnosis not present

## 2016-09-17 DIAGNOSIS — N183 Chronic kidney disease, stage 3 (moderate): Secondary | ICD-10-CM | POA: Diagnosis not present

## 2016-09-17 DIAGNOSIS — Z452 Encounter for adjustment and management of vascular access device: Secondary | ICD-10-CM | POA: Diagnosis not present

## 2016-09-17 LAB — TRANSFUSION REACTION
DAT C3: NEGATIVE
POST RXN DAT IGG: NEGATIVE

## 2016-09-21 ENCOUNTER — Other Ambulatory Visit: Payer: Medicare Other

## 2016-09-21 DIAGNOSIS — Z452 Encounter for adjustment and management of vascular access device: Secondary | ICD-10-CM | POA: Diagnosis not present

## 2016-09-21 DIAGNOSIS — K5090 Crohn's disease, unspecified, without complications: Secondary | ICD-10-CM | POA: Diagnosis not present

## 2016-09-21 DIAGNOSIS — N183 Chronic kidney disease, stage 3 (moderate): Secondary | ICD-10-CM | POA: Diagnosis not present

## 2016-09-21 DIAGNOSIS — Z48815 Encounter for surgical aftercare following surgery on the digestive system: Secondary | ICD-10-CM | POA: Diagnosis not present

## 2016-09-21 DIAGNOSIS — Z432 Encounter for attention to ileostomy: Secondary | ICD-10-CM | POA: Diagnosis not present

## 2016-09-21 DIAGNOSIS — Z932 Ileostomy status: Secondary | ICD-10-CM | POA: Diagnosis not present

## 2016-09-21 DIAGNOSIS — Z9049 Acquired absence of other specified parts of digestive tract: Secondary | ICD-10-CM | POA: Diagnosis not present

## 2016-09-21 DIAGNOSIS — I129 Hypertensive chronic kidney disease with stage 1 through stage 4 chronic kidney disease, or unspecified chronic kidney disease: Secondary | ICD-10-CM | POA: Diagnosis not present

## 2016-09-22 DIAGNOSIS — K5090 Crohn's disease, unspecified, without complications: Secondary | ICD-10-CM | POA: Diagnosis not present

## 2016-09-22 DIAGNOSIS — Z452 Encounter for adjustment and management of vascular access device: Secondary | ICD-10-CM | POA: Diagnosis not present

## 2016-09-22 DIAGNOSIS — N183 Chronic kidney disease, stage 3 (moderate): Secondary | ICD-10-CM | POA: Diagnosis not present

## 2016-09-22 DIAGNOSIS — I129 Hypertensive chronic kidney disease with stage 1 through stage 4 chronic kidney disease, or unspecified chronic kidney disease: Secondary | ICD-10-CM | POA: Diagnosis not present

## 2016-09-22 DIAGNOSIS — Z48815 Encounter for surgical aftercare following surgery on the digestive system: Secondary | ICD-10-CM | POA: Diagnosis not present

## 2016-09-22 DIAGNOSIS — Z432 Encounter for attention to ileostomy: Secondary | ICD-10-CM | POA: Diagnosis not present

## 2016-09-24 ENCOUNTER — Encounter: Payer: Medicare Other | Admitting: Cardiothoracic Surgery

## 2016-09-24 DIAGNOSIS — Z432 Encounter for attention to ileostomy: Secondary | ICD-10-CM | POA: Diagnosis not present

## 2016-09-24 DIAGNOSIS — N183 Chronic kidney disease, stage 3 (moderate): Secondary | ICD-10-CM | POA: Diagnosis not present

## 2016-09-24 DIAGNOSIS — I129 Hypertensive chronic kidney disease with stage 1 through stage 4 chronic kidney disease, or unspecified chronic kidney disease: Secondary | ICD-10-CM | POA: Diagnosis not present

## 2016-09-24 DIAGNOSIS — Z452 Encounter for adjustment and management of vascular access device: Secondary | ICD-10-CM | POA: Diagnosis not present

## 2016-09-24 DIAGNOSIS — Z48815 Encounter for surgical aftercare following surgery on the digestive system: Secondary | ICD-10-CM | POA: Diagnosis not present

## 2016-09-24 DIAGNOSIS — K5090 Crohn's disease, unspecified, without complications: Secondary | ICD-10-CM | POA: Diagnosis not present

## 2016-09-25 DIAGNOSIS — I10 Essential (primary) hypertension: Secondary | ICD-10-CM | POA: Diagnosis not present

## 2016-09-25 DIAGNOSIS — N183 Chronic kidney disease, stage 3 (moderate): Secondary | ICD-10-CM | POA: Diagnosis not present

## 2016-09-28 DIAGNOSIS — Z6826 Body mass index (BMI) 26.0-26.9, adult: Secondary | ICD-10-CM | POA: Diagnosis not present

## 2016-09-28 DIAGNOSIS — N184 Chronic kidney disease, stage 4 (severe): Secondary | ICD-10-CM | POA: Diagnosis not present

## 2016-09-28 DIAGNOSIS — N189 Chronic kidney disease, unspecified: Secondary | ICD-10-CM | POA: Diagnosis not present

## 2016-09-28 DIAGNOSIS — I714 Abdominal aortic aneurysm, without rupture: Secondary | ICD-10-CM | POA: Diagnosis not present

## 2016-09-28 DIAGNOSIS — K5180 Other ulcerative colitis without complications: Secondary | ICD-10-CM | POA: Diagnosis not present

## 2016-09-28 DIAGNOSIS — Z1389 Encounter for screening for other disorder: Secondary | ICD-10-CM | POA: Diagnosis not present

## 2016-09-29 DIAGNOSIS — Z48815 Encounter for surgical aftercare following surgery on the digestive system: Secondary | ICD-10-CM | POA: Diagnosis not present

## 2016-09-29 DIAGNOSIS — N183 Chronic kidney disease, stage 3 (moderate): Secondary | ICD-10-CM | POA: Diagnosis not present

## 2016-09-29 DIAGNOSIS — Z452 Encounter for adjustment and management of vascular access device: Secondary | ICD-10-CM | POA: Diagnosis not present

## 2016-09-29 DIAGNOSIS — K5090 Crohn's disease, unspecified, without complications: Secondary | ICD-10-CM | POA: Diagnosis not present

## 2016-09-29 DIAGNOSIS — Z432 Encounter for attention to ileostomy: Secondary | ICD-10-CM | POA: Diagnosis not present

## 2016-09-29 DIAGNOSIS — I129 Hypertensive chronic kidney disease with stage 1 through stage 4 chronic kidney disease, or unspecified chronic kidney disease: Secondary | ICD-10-CM | POA: Diagnosis not present

## 2016-10-01 DIAGNOSIS — Z48815 Encounter for surgical aftercare following surgery on the digestive system: Secondary | ICD-10-CM | POA: Diagnosis not present

## 2016-10-01 DIAGNOSIS — Z452 Encounter for adjustment and management of vascular access device: Secondary | ICD-10-CM | POA: Diagnosis not present

## 2016-10-01 DIAGNOSIS — I129 Hypertensive chronic kidney disease with stage 1 through stage 4 chronic kidney disease, or unspecified chronic kidney disease: Secondary | ICD-10-CM | POA: Diagnosis not present

## 2016-10-01 DIAGNOSIS — Z432 Encounter for attention to ileostomy: Secondary | ICD-10-CM | POA: Diagnosis not present

## 2016-10-01 DIAGNOSIS — K5090 Crohn's disease, unspecified, without complications: Secondary | ICD-10-CM | POA: Diagnosis not present

## 2016-10-01 DIAGNOSIS — N183 Chronic kidney disease, stage 3 (moderate): Secondary | ICD-10-CM | POA: Diagnosis not present

## 2016-10-02 DIAGNOSIS — I129 Hypertensive chronic kidney disease with stage 1 through stage 4 chronic kidney disease, or unspecified chronic kidney disease: Secondary | ICD-10-CM | POA: Diagnosis not present

## 2016-10-02 DIAGNOSIS — Z48815 Encounter for surgical aftercare following surgery on the digestive system: Secondary | ICD-10-CM | POA: Diagnosis not present

## 2016-10-02 DIAGNOSIS — N183 Chronic kidney disease, stage 3 (moderate): Secondary | ICD-10-CM | POA: Diagnosis not present

## 2016-10-02 DIAGNOSIS — Z432 Encounter for attention to ileostomy: Secondary | ICD-10-CM | POA: Diagnosis not present

## 2016-10-02 DIAGNOSIS — Z452 Encounter for adjustment and management of vascular access device: Secondary | ICD-10-CM | POA: Diagnosis not present

## 2016-10-02 DIAGNOSIS — K5090 Crohn's disease, unspecified, without complications: Secondary | ICD-10-CM | POA: Diagnosis not present

## 2016-10-05 DIAGNOSIS — K5090 Crohn's disease, unspecified, without complications: Secondary | ICD-10-CM | POA: Diagnosis not present

## 2016-10-05 DIAGNOSIS — Z48815 Encounter for surgical aftercare following surgery on the digestive system: Secondary | ICD-10-CM | POA: Diagnosis not present

## 2016-10-05 DIAGNOSIS — Z452 Encounter for adjustment and management of vascular access device: Secondary | ICD-10-CM | POA: Diagnosis not present

## 2016-10-05 DIAGNOSIS — I129 Hypertensive chronic kidney disease with stage 1 through stage 4 chronic kidney disease, or unspecified chronic kidney disease: Secondary | ICD-10-CM | POA: Diagnosis not present

## 2016-10-05 DIAGNOSIS — Z432 Encounter for attention to ileostomy: Secondary | ICD-10-CM | POA: Diagnosis not present

## 2016-10-05 DIAGNOSIS — N183 Chronic kidney disease, stage 3 (moderate): Secondary | ICD-10-CM | POA: Diagnosis not present

## 2016-10-06 DIAGNOSIS — Z48815 Encounter for surgical aftercare following surgery on the digestive system: Secondary | ICD-10-CM | POA: Diagnosis not present

## 2016-10-06 DIAGNOSIS — I129 Hypertensive chronic kidney disease with stage 1 through stage 4 chronic kidney disease, or unspecified chronic kidney disease: Secondary | ICD-10-CM | POA: Diagnosis not present

## 2016-10-06 DIAGNOSIS — N183 Chronic kidney disease, stage 3 (moderate): Secondary | ICD-10-CM | POA: Diagnosis not present

## 2016-10-06 DIAGNOSIS — K5090 Crohn's disease, unspecified, without complications: Secondary | ICD-10-CM | POA: Diagnosis not present

## 2016-10-06 DIAGNOSIS — Z452 Encounter for adjustment and management of vascular access device: Secondary | ICD-10-CM | POA: Diagnosis not present

## 2016-10-06 DIAGNOSIS — Z432 Encounter for attention to ileostomy: Secondary | ICD-10-CM | POA: Diagnosis not present

## 2016-10-07 DIAGNOSIS — I129 Hypertensive chronic kidney disease with stage 1 through stage 4 chronic kidney disease, or unspecified chronic kidney disease: Secondary | ICD-10-CM | POA: Diagnosis not present

## 2016-10-07 DIAGNOSIS — Z452 Encounter for adjustment and management of vascular access device: Secondary | ICD-10-CM | POA: Diagnosis not present

## 2016-10-07 DIAGNOSIS — Z48815 Encounter for surgical aftercare following surgery on the digestive system: Secondary | ICD-10-CM | POA: Diagnosis not present

## 2016-10-07 DIAGNOSIS — Z432 Encounter for attention to ileostomy: Secondary | ICD-10-CM | POA: Diagnosis not present

## 2016-10-07 DIAGNOSIS — K5090 Crohn's disease, unspecified, without complications: Secondary | ICD-10-CM | POA: Diagnosis not present

## 2016-10-07 DIAGNOSIS — N183 Chronic kidney disease, stage 3 (moderate): Secondary | ICD-10-CM | POA: Diagnosis not present

## 2016-10-08 DIAGNOSIS — Z48815 Encounter for surgical aftercare following surgery on the digestive system: Secondary | ICD-10-CM | POA: Diagnosis not present

## 2016-10-08 DIAGNOSIS — Z452 Encounter for adjustment and management of vascular access device: Secondary | ICD-10-CM | POA: Diagnosis not present

## 2016-10-08 DIAGNOSIS — I129 Hypertensive chronic kidney disease with stage 1 through stage 4 chronic kidney disease, or unspecified chronic kidney disease: Secondary | ICD-10-CM | POA: Diagnosis not present

## 2016-10-08 DIAGNOSIS — Z432 Encounter for attention to ileostomy: Secondary | ICD-10-CM | POA: Diagnosis not present

## 2016-10-08 DIAGNOSIS — K5090 Crohn's disease, unspecified, without complications: Secondary | ICD-10-CM | POA: Diagnosis not present

## 2016-10-08 DIAGNOSIS — N183 Chronic kidney disease, stage 3 (moderate): Secondary | ICD-10-CM | POA: Diagnosis not present

## 2016-10-11 DIAGNOSIS — K5090 Crohn's disease, unspecified, without complications: Secondary | ICD-10-CM | POA: Diagnosis not present

## 2016-10-11 DIAGNOSIS — I129 Hypertensive chronic kidney disease with stage 1 through stage 4 chronic kidney disease, or unspecified chronic kidney disease: Secondary | ICD-10-CM | POA: Diagnosis not present

## 2016-10-11 DIAGNOSIS — Z48815 Encounter for surgical aftercare following surgery on the digestive system: Secondary | ICD-10-CM | POA: Diagnosis not present

## 2016-10-11 DIAGNOSIS — Z452 Encounter for adjustment and management of vascular access device: Secondary | ICD-10-CM | POA: Diagnosis not present

## 2016-10-11 DIAGNOSIS — N183 Chronic kidney disease, stage 3 (moderate): Secondary | ICD-10-CM | POA: Diagnosis not present

## 2016-10-11 DIAGNOSIS — Z432 Encounter for attention to ileostomy: Secondary | ICD-10-CM | POA: Diagnosis not present

## 2016-10-12 DIAGNOSIS — Z48815 Encounter for surgical aftercare following surgery on the digestive system: Secondary | ICD-10-CM | POA: Diagnosis not present

## 2016-10-12 DIAGNOSIS — Z432 Encounter for attention to ileostomy: Secondary | ICD-10-CM | POA: Diagnosis not present

## 2016-10-12 DIAGNOSIS — Z452 Encounter for adjustment and management of vascular access device: Secondary | ICD-10-CM | POA: Diagnosis not present

## 2016-10-12 DIAGNOSIS — K5090 Crohn's disease, unspecified, without complications: Secondary | ICD-10-CM | POA: Diagnosis not present

## 2016-10-12 DIAGNOSIS — I129 Hypertensive chronic kidney disease with stage 1 through stage 4 chronic kidney disease, or unspecified chronic kidney disease: Secondary | ICD-10-CM | POA: Diagnosis not present

## 2016-10-12 DIAGNOSIS — N183 Chronic kidney disease, stage 3 (moderate): Secondary | ICD-10-CM | POA: Diagnosis not present

## 2016-10-14 ENCOUNTER — Encounter (HOSPITAL_COMMUNITY): Payer: Self-pay | Admitting: Family Medicine

## 2016-10-14 ENCOUNTER — Emergency Department (HOSPITAL_COMMUNITY)
Admission: EM | Admit: 2016-10-14 | Discharge: 2016-10-15 | Disposition: A | Discharge status: Home / Self Care | Payer: Medicare Other | Attending: Emergency Medicine | Admitting: Emergency Medicine

## 2016-10-14 DIAGNOSIS — Z96612 Presence of left artificial shoulder joint: Secondary | ICD-10-CM | POA: Diagnosis not present

## 2016-10-14 DIAGNOSIS — B349 Viral infection, unspecified: Secondary | ICD-10-CM | POA: Diagnosis not present

## 2016-10-14 DIAGNOSIS — I129 Hypertensive chronic kidney disease with stage 1 through stage 4 chronic kidney disease, or unspecified chronic kidney disease: Secondary | ICD-10-CM | POA: Insufficient documentation

## 2016-10-14 DIAGNOSIS — Z85828 Personal history of other malignant neoplasm of skin: Secondary | ICD-10-CM | POA: Insufficient documentation

## 2016-10-14 DIAGNOSIS — Z87891 Personal history of nicotine dependence: Secondary | ICD-10-CM | POA: Diagnosis not present

## 2016-10-14 DIAGNOSIS — R509 Fever, unspecified: Secondary | ICD-10-CM | POA: Diagnosis not present

## 2016-10-14 DIAGNOSIS — D696 Thrombocytopenia, unspecified: Secondary | ICD-10-CM | POA: Insufficient documentation

## 2016-10-14 DIAGNOSIS — N183 Chronic kidney disease, stage 3 (moderate): Secondary | ICD-10-CM | POA: Insufficient documentation

## 2016-10-14 DIAGNOSIS — Z432 Encounter for attention to ileostomy: Secondary | ICD-10-CM | POA: Diagnosis not present

## 2016-10-14 DIAGNOSIS — J9811 Atelectasis: Secondary | ICD-10-CM | POA: Diagnosis not present

## 2016-10-14 DIAGNOSIS — R161 Splenomegaly, not elsewhere classified: Secondary | ICD-10-CM | POA: Diagnosis not present

## 2016-10-14 DIAGNOSIS — Z79899 Other long term (current) drug therapy: Secondary | ICD-10-CM | POA: Diagnosis not present

## 2016-10-14 DIAGNOSIS — Z452 Encounter for adjustment and management of vascular access device: Secondary | ICD-10-CM | POA: Diagnosis not present

## 2016-10-14 DIAGNOSIS — Z48815 Encounter for surgical aftercare following surgery on the digestive system: Secondary | ICD-10-CM | POA: Diagnosis not present

## 2016-10-14 DIAGNOSIS — K5090 Crohn's disease, unspecified, without complications: Secondary | ICD-10-CM | POA: Diagnosis not present

## 2016-10-14 LAB — I-STAT CHEM 8, ED
BUN: <3 mg/dL — ABNORMAL LOW (ref 6–20)
CALCIUM ION: 1 mmol/L — AB (ref 1.15–1.40)
CHLORIDE: 107 mmol/L (ref 101–111)
Creatinine, Ser: 1.4 mg/dL — ABNORMAL HIGH (ref 0.61–1.24)
Glucose, Bld: 109 mg/dL — ABNORMAL HIGH (ref 65–99)
HEMATOCRIT: 35 % — AB (ref 39.0–52.0)
Hemoglobin: 11.9 g/dL — ABNORMAL LOW (ref 13.0–17.0)
Potassium: 3.6 mmol/L (ref 3.5–5.1)
SODIUM: 140 mmol/L (ref 135–145)
TCO2: 17 mmol/L (ref 0–100)

## 2016-10-14 NOTE — ED Provider Notes (Signed)
[facility] DEPT Provider Note   CSN: [id number] Arrival date & time: 10/14/16  2250  By signing my name below, I, Oleh Genin, attest that this documentation has been prepared under the direction and in the presence of Jenalee Trevizo, MD. Electronically Signed: Oleh Genin, Scribe. 10/14/16. 11:56 PM.   History   Chief Complaint Chief Complaint  Patient presents with  . Fever  . Emesis    HPI Derek [name] is a 76 y.o. male with history of a laparoscopic resection of the transverse colon on 08/14/16 as well as atrial fibrillation who presents to the ED with a documented fever. This patient states that approximately 4 hours ago he developed chills and recorded himself febrile to 102.7F. Around 30 minutes later he took 2x Tylenol and presented to triage. At interview, he does reporting mild nausea with retching earlier in the day which has resolved. Denies cough. No chest pain. He is otherwise reporting ongoing problems with peripheral edema which is unchanged. When asked, he does report some occasional burning sensation around his ostomy site during output.   The history is provided by the patient. No language interpreter was used.  Fever   This is a new problem. The current episode started 3 to 5 hours ago. The problem occurs constantly. The problem has been gradually improving. The maximum temperature noted was 102 to 102.9 F. Associated symptoms include vomiting and muscle aches. Pertinent negatives include no chest pain, no fussiness, no sleepiness, no diarrhea, no congestion, no sore throat, no tugging at ear and no cough. Associated symptoms comments: nausea. Treatments tried: tylenol. The treatment provided significant relief.    Past Medical History:  Diagnosis Date  . Anemia   . Anxiety   . Aortic insufficiency    a. mild-mod by echo 09/2015.  . Arthritis    "knees; left shoulder" (09/21/2013)  . Ascending aortic aneurysm (HCC)    a. last measurement 5.3 cm  03/2016 -> f/u planned 09/2015 to continue to follow.  . Atrial fibrillation ([place])   . B12 deficiency    takes Vit 12 shot every 14days   . Cataract   . CKD (chronic kidney disease) stage 3, GFR 30-59 ml/min 08/22/2011  . Clotting disorder ([place])   . Crohn's disease ([place])   . Depression   . Enlarged prostate   . Enteric hyperoxaluria ([place]) 02/21/2016  . GERD (gastroesophageal reflux disease)    takes Omeprazole daily  . Gout    takes Uloric and Colchicine daily  . Heart murmur   . Hepatitis C 1978   negtive RNA load - spontaneously cleared  . Hiatal hernia   . History of blood transfusion 1978; 1990's; ?   "w/bowel resection; S/P allupurinol; ?" (09/21/2013)  . History of colon polyps   . History of kidney stones   . History of MRSA infection 2010  . History of pulmonary embolism 2006   both legs and both lungs /notes 08/26/2008 (09/21/2013)  . History of small bowel obstruction   . History of staph infection 1978  . Hyperoxaluria (HCC)    Intestinal  . Hypertension   . Insomnia    takes Trazodone nightly  . Internal hemorrhoids   . LV dysfunction    a. h/o EF 45-50% in 2015, normalized on subsequent echoes.  . Nephrolithiasis   . Pancreatitis 2010   elevated lipase and amylase, stranding in tail of pancreas, ? from Humira  . Pancytopenia    Hx of  . Peripheral neuropathy ([place])  takes Gabapentin daily  . Pneumonia    hx of   . Post-traumatic stress syndrome    takes Paxil nightly  . PTSD (post-traumatic stress disorder)   . Pulmonary nodule    a. 85m by CT 05/2015, recommended f/u 6-12 months.  . RLS (restless legs syndrome)   . Rosacea conjunctivitis(372.31)    takes Minocin daily  . Secondary hyperparathyroidism ([place] 02/21/2016  . Sinus bradycardia   . Skin cancer    "cut/burned off left ear and face" (09/21/2013)  . Small bowel obstruction   . Thrombocytopenia ([place]    hx of    Patient Active Problem List   Diagnosis Date Noted  . Bowel perforation ([place]  08/27/2016  . Colonic ischemia ([place] 08/27/2016  . Pain of upper abdomen   . Chronic anticoagulation 08/18/2016  . Sinus bradycardia 08/17/2016  . S/P colon resection 08/14/2016  . Adenomatous polyp of transverse colon s/p partial colectomy 08/14/2016 08/14/2016  . Infectious diarrhea   . Chronic diarrhea   . Essential hypertension 05/27/2016  . Hypotension 05/27/2016  . Hypomagnesemia 05/27/2016  . Peripheral neuropathic pain 05/26/2016  . GERD (gastroesophageal reflux disease) 05/26/2016  . PAF (paroxysmal atrial fibrillation) ([place] 05/26/2016  . Lung nodule 05/12/2016  . SBO (small bowel obstruction) 05/11/2016  . Enteric hyperoxaluria ([place] 02/21/2016  . Secondary hyperparathyroidism ([place] 02/21/2016  . Pain in joint, shoulder region 10/12/2013  . Decreased range of motion of left shoulder 10/12/2013  . Muscle weakness (generalized) 10/12/2013  . Restriction of joint motion 10/12/2013  . S/P shoulder replacement 09/21/2013  . Cardiomyopathy ([place] 09/07/2013  . Anxiety 08/22/2011  . CKD (chronic kidney disease) stage 3, GFR 30-59 ml/min 08/22/2011  . Thrombocytopenia ([facility] 08/22/2011  . CORONARY ATHEROSCLEROSIS NATIVE CORONARY ARTERY 06/25/2010  . Ascending aortic aneurysm ([place] 06/26/2009  . Crohn's ileocolitis ([place] 06/26/2009  . B12 DEFICIENCY 05/09/2008  . Gout 05/09/2008  . POST TRAUMATIC STRESS SYNDROME 05/09/2008  . GERD 05/09/2008  . HEPATITIS C Ab positive RNA neg, HX OF 05/09/2008  . PULMONARY EMBOLISM, HX OF 05/09/2008  . GASTRITIS, HX OF 05/09/2008    Past Surgical History:  Procedure Laterality Date  . ANKLE SURGERY Right   . APPENDECTOMY  1978  . BOWEL RESECTION  1978 X 2  . CARDIOVERSION N/A 05/29/2016   Procedure: CARDIOVERSION;  Surgeon: MSanda Klein MD;  Location: [facility];  Service: Cardiovascular;  Laterality: N/A;  . CHOLECYSTECTOMY    . COLON RESECTION N/A 08/14/2016   Procedure: LAPAROSCOPIC RESECTION TRANSVERSE COLON;  Surgeon: DAlphonsa Overall MD;  Location: [facility];  Service: General;  Laterality: N/A;  . COLON SURGERY    . COLONOSCOPY    . ESOPHAGOGASTRODUODENOSCOPY    . EYE SURGERY     cataract surgery bilateral  . FOOT SURGERY Right    "took gout out"  . HEMICOLECTOMY Right   . ILEOCECETOMY  1978   /[name]10/03/2000  (09/21/2013)  . ILEOSTOMY    . INGUINAL HERNIA REPAIR Right   . KNEE ARTHROSCOPY Left   . LAPAROTOMY N/A 08/27/2016   Procedure: EXPLORATORYLAPAROTOMY, LYSIS OF ADHESIONS, ILEOSTOMY, RIGHT COLECTOMY;  Surgeon: DAlphonsa Overall MD;  Location: [facility];  Service: General;  Laterality: N/A;  . LIGAMENT REPAIR Left   . TEE WITHOUT CARDIOVERSION N/A 05/29/2016   Procedure: TRANSESOPHAGEAL ECHOCARDIOGRAM (TEE);  Surgeon: MSanda Klein MD;  [facility]  Service: Cardiovascular;  Laterality: N/A;  . TOTAL SHOULDER ARTHROPLASTY Left 09/21/2013  . TOTAL SHOULDER ARTHROPLASTY Left 09/21/2013   Procedure: LEFT  TOTAL SHOULDER ARTHROPLASTY;  Surgeon: Marin Shutter, MD;  Location: [facility];  Service: Orthopedics;  Laterality: Left;       Home Medications    Prior to Admission medications   Medication Sig Start Date End Date Taking? Authorizing Provider  Acidophilus Lactobacillus CAPS Take 1 capsule by mouth daily. 06/08/16  Yes [name], MD  amiodarone (PACERONE) 200 MG tablet Take 1 tablet (200 mg total) by mouth 2 (two) times daily. 07/02/16  Yes Brittainy Erie Noe, PA-C  carboxymethylcellulose (REFRESH PLUS) 0.5 % SOLN Place 1 drop into both eyes 4 (four) times daily.   Yes Historical Provider, MD  cholecalciferol (VITAMIN D) 1000 units tablet Take 1,000 Units by mouth daily.   Yes Historical Provider, MD  cyanocobalamin (,VITAMIN B-12,) 1000 MCG/ML injection Inject 1,000 mcg into the muscle every 14 (fourteen) days.   Yes Historical Provider, MD  cycloSPORINE (RESTASIS) 0.05 % ophthalmic emulsion Place 1 drop into both eyes 2 (two) times daily.    Yes Historical Provider, MD  ELIQUIS 5 MG TABS  tablet Take 5 mg by mouth 2 (two) times daily.   Yes Historical Provider, MD  febuxostat (ULORIC) 40 MG tablet Take 80 mg by mouth daily.   Yes Historical Provider, MD  gabapentin (NEURONTIN) 100 MG capsule Take 100-200 mg by mouth 2 (two) times daily. Pt takes one capsule in the morning and two at night.   Yes Historical Provider, MD  HYDROcodone-acetaminophen (NORCO/VICODIN) 5-325 MG tablet Take 1-2 tablets by mouth every 6 (six) hours as needed for moderate pain. 08/21/16  Yes [name], MD  loperamide (IMODIUM) 2 MG capsule Take 4 mg by mouth 2 (two) times daily.   Yes Historical Provider, MD  magnesium oxide (MAGNESIUM-OXIDE) 400 (241.3 Mg) MG tablet Take 400 mg by mouth 2 (two) times daily.   Yes Historical Provider, MD  minocycline (MINOCIN,DYNACIN) 100 MG capsule Take 100 mg by mouth daily as needed (when breakout occurs on face).    Yes Historical Provider, MD  Multiple Vitamin (MULTIVITAMIN WITH MINERALS) TABS tablet Take 1 tablet by mouth daily.   Yes Historical Provider, MD  omeprazole (PRILOSEC) 20 MG capsule Take 20 mg by mouth daily.     Yes Historical Provider, MD  PARoxetine (PAXIL) 20 MG tablet Take 20 mg by mouth at bedtime.   Yes Historical Provider, MD  promethazine (PHENERGAN) 25 MG tablet Take 25 mg by mouth every 6 (six) hours as needed for nausea or vomiting.    Yes Historical Provider, MD  psyllium (REGULOID) 0.52 g capsule Take 0.52 g by mouth 2 (two) times daily.   Yes Historical Provider, MD  sodium chloride 0.9 % infusion Inject 2,000 mLs into the vein 1 day or 1 dose. Give 2,000 cc per day - this could be given as 200 cc per hour x 10.  The patient has a PICC.  Let me know if there is a question 09/14/16 10/14/16 Yes [name], MD  terazosin (HYTRIN) 2 MG capsule Take 2 mg by mouth at bedtime.    Yes Historical Provider, MD  traZODone (DESYREL) 150 MG tablet Take 75-150 mg by mouth at bedtime as needed for sleep.    Yes Historical Provider, MD    Family  History Family History  Problem Relation Age of Onset  . Kidney disease Father   . Hypertension Father   . Aneurysm Mother   . Aneurysm Sister   . Esophageal cancer Neg Hx   . Stomach cancer Neg Hx   .  Rectal cancer Neg Hx     Social History Social History  Substance Use Topics  . Smoking status: Former Smoker    Packs/day: 2.00    Years: 20.00    Types: Cigarettes    Quit date: 08/04/1975  . Smokeless tobacco: Former Systems developer    Types: [place] date: 08/03/1978     Comment: 09/21/2013 "quit smoking in the late 1970's; stopped chewing couple years after I quit smoking"  . Alcohol use No     Allergies   Lorazepam and Humira [adalimumab]   Review of Systems Review of Systems  Constitutional: Positive for fever. Negative for appetite change and diaphoresis.  HENT: Negative for congestion, ear pain and sore throat.   Eyes: Negative for photophobia, pain and visual disturbance.  Respiratory: Negative for cough and shortness of breath.   Cardiovascular: Negative for chest pain and palpitations.  Gastrointestinal: Positive for nausea and vomiting. Negative for abdominal pain, diarrhea and rectal pain.  Genitourinary: Negative for dysuria and hematuria.  Musculoskeletal: Negative for back pain, neck pain and neck stiffness.  Skin: Negative for color change and rash.  Neurological: Negative for seizures and syncope.  All other systems reviewed and are negative.    Physical Exam Updated Vital Signs BP 127/68 (BP Location: Left Arm) Comment: Manual BP  Pulse 95   Temp 98.2 F (36.8 C) (Oral)   Resp 20   Ht 6' (1.829 m)   Wt 200 lb (90.7 kg)   SpO2 97%   BMI 27.12 kg/m   Physical Exam  Constitutional: He is oriented to person, place, and time. He appears well-developed and well-nourished.  HENT:  Head: Normocephalic and atraumatic.  Mouth/Throat: Oropharynx is clear and moist. No oropharyngeal exudate.  Eyes: Conjunctivae are normal. Pupils are equal, round, and  reactive to light.  Neck: Normal range of motion. Neck supple. Carotid bruit is not present. No tracheal deviation present.  Cardiovascular: Normal rate, regular rhythm, normal heart sounds and intact distal pulses.   No murmur heard. Pulmonary/Chest: Effort normal and breath sounds normal. No stridor. No respiratory distress. He has no wheezes. He has no rales.  Breath sounds diminished throughout.   Abdominal: Soft. Bowel sounds are normal. He exhibits no mass. There is no tenderness. There is no rebound and no guarding.  Patient has a well healing abdominal incision that does not appear to be infected.   Musculoskeletal: Normal range of motion. He exhibits no edema.  Lymphadenopathy:    He has no cervical adenopathy.  Neurological: He is alert and oriented to person, place, and time. He displays normal reflexes. He exhibits normal muscle tone. Coordination normal.  Skin: Skin is warm and dry. Capillary refill takes less than 2 seconds.  Psychiatric: He has a normal mood and affect.  Nursing note and vitals reviewed.    ED Treatments / Results   Vitals:   10/15/16 0111 10/15/16 0242  BP:  131/70  Pulse:  98  Resp:  20  Temp: 100.3 F (37.9 C) 101.7 F (38.7 C)    DIAGNOSTIC STUDIES: Oxygen Saturation is 97 percent on room air which is normal by my interpretation.    COORDINATION OF CARE: 11:56 PM Discussed treatment plan with pt at bedside and pt agreed to plan.   Labs (all labs ordered are listed, but only abnormal results are displayed)  Results for orders placed or performed during the hospital encounter of 10/14/16  CBC with Differential/Platelet  Result Value Ref Range   WBC  5.3 4.0 - 10.5 K/uL   RBC 4.25 4.22 - 5.81 MIL/uL   Hemoglobin 11.3 (L) 13.0 - 17.0 g/dL   HCT 34.6 (L) 39.0 - 52.0 %   MCV 81.4 78.0 - 100.0 fL   MCH 26.6 26.0 - 34.0 pg   MCHC 32.7 30.0 - 36.0 g/dL   RDW 14.7 11.5 - 15.5 %   Platelets 75 (L) 150 - 400 K/uL   Neutrophils Relative % 90 %    Neutro Abs 4.8 1.7 - 7.7 K/uL   Lymphocytes Relative 5 %   Lymphs Abs 0.2 (L) 0.7 - 4.0 K/uL   Monocytes Relative 4 %   Monocytes Absolute 0.2 0.1 - 1.0 K/uL   Eosinophils Relative 1 %   Eosinophils Absolute 0.1 0.0 - 0.7 K/uL   Basophils Relative 0 %   Basophils Absolute 0.0 0.0 - 0.1 K/uL  Urinalysis, Routine w reflex microscopic  Result Value Ref Range   Color, Urine YELLOW YELLOW   APPearance CLEAR CLEAR   Specific Gravity, Urine 1.011 1.005 - 1.030   pH 5.0 5.0 - 8.0   Glucose, UA NEGATIVE NEGATIVE mg/dL   Hgb urine dipstick SMALL (A) NEGATIVE   Bilirubin Urine NEGATIVE NEGATIVE   Ketones, ur NEGATIVE NEGATIVE mg/dL   Protein, ur NEGATIVE NEGATIVE mg/dL   Nitrite NEGATIVE NEGATIVE   Leukocytes, UA NEGATIVE NEGATIVE   RBC / HPF 0-5 0 - 5 RBC/hpf   WBC, UA 0-5 0 - 5 WBC/hpf   Bacteria, UA NONE SEEN NONE SEEN   Squamous Epithelial / LPF NONE SEEN NONE SEEN   Mucous PRESENT    Sperm, UA PRESENT   I-Stat Chem 8, ED  Result Value Ref Range   Sodium 140 135 - 145 mmol/L   Potassium 3.6 3.5 - 5.1 mmol/L   Chloride 107 101 - 111 mmol/L   BUN <3 (L) 6 - 20 mg/dL   Creatinine, Ser 1.40 (H) 0.61 - 1.24 mg/dL   Glucose, Bld 109 (H) 65 - 99 mg/dL   Calcium, Ion 1.00 (L) 1.15 - 1.40 mmol/L   TCO2 17 0 - 100 mmol/L   Hemoglobin 11.9 (L) 13.0 - 17.0 g/dL   HCT 35.0 (L) 39.0 - 52.0 %   Dg Chest 2 View  Result Date: 10/15/2016 CLINICAL DATA:  Fever for 48 hours. Bowel resection in January of this year. EXAM: CHEST  2 VIEW COMPARISON:  08/29/2016 FINDINGS: Shallow inspiration with atelectasis in the lung bases, similar to prior study. Heart size and pulmonary vascularity are normal. No developing consolidation or edema. No pneumothorax. No blunting of costophrenic angles. Right PICC line with tip over the cavoatrial junction. No pneumothorax. Calcification of the aorta. Postoperative change in the left shoulder. IMPRESSION: Shallow inspiration with linear atelectasis in the lung bases,  similar to prior study. Electronically Signed   By: Lucienne Capers M.D.   On: 10/15/2016 01:03   Ct Abdomen Pelvis W Contrast  Result Date: 10/15/2016 CLINICAL DATA:  75 year old male with fever. An status post laparoscopic resection of the transverse colon on 08/14/2016. Burning sensation at the ostomy site. EXAM: CT ABDOMEN AND PELVIS WITH CONTRAST TECHNIQUE: Multidetector CT imaging of the abdomen and pelvis was performed using the standard protocol following bolus administration of intravenous contrast. CONTRAST:  59m ISOVUE-300 IOPAMIDOL (ISOVUE-300) INJECTION 61% COMPARISON:  Abdominal CT dated 09/03/2016 FINDINGS: Lower chest: Bibasilar linear atelectasis/ scarring. The visualized lung bases are otherwise clear. Partially visualized central venous line in the right atrium. No intra-abdominal free air. There  is mild diffuse mesenteric and omental edema, improved compared to the prior CT. No free fluid. Hepatobiliary: Cholecystectomy. Mild intrahepatic biliary ductal dilatation versus mild periportal edema. The liver is unremarkable. Pancreas: Unremarkable. No pancreatic ductal dilatation or surrounding inflammatory changes. Spleen: Splenomegaly measuring up to 17 cm in length similar to prior exam. Adrenals/Urinary Tract: The adrenal glands are unremarkable. Bilateral renal cortical atrophy and irregularity. Small bilateral nonobstructing renal calculi measure up to 4 mm in the interpolar aspect of the right kidney. There is no hydronephrosis on either side. Bilateral renal hypodense lesions measure up to 19 mm along the medial aspect of the inferior pole of the right kidney. The larger lesions represent cysts and the smaller lesions are too small to characterize. There is no hydronephrosis on either side. There is symmetric uptake and excretion of contrast by kidneys bilaterally. The visualized ureters and urinary bladder appear unremarkable. Stomach/Bowel: There is postsurgical changes of right  hemicolectomy with a right lower quadrant ileostomy. Oral contrast opacifies the stomach and multiple loops of small bowel and traversing into the ileostomy. There is no evidence of bowel obstruction. No active inflammatory changes of the small bowel. Contrast load distal colon and rectum presumably from prior study or related to rectal administration. Mild thickened appearance of the colonic mucosa similar to prior CT. There is diffuse omental edema with overall interval improvement compared to the prior CT. No drainable fluid collection or abscess identified. Vascular/Lymphatic: There is moderate aortoiliac atherosclerotic disease. There is no aneurysmal dilatation or evidence of dissection. The origins of the mesenteric vasculature appear patent. The SMV, splenic vein, and main portal vein are patent. No portal venous gas identified. There is no adenopathy. Reproductive: The prostate and seminal vesicles appear unremarkable. Other: Postoperative changes of the anterior abdominal wall with a midline vertical anterior abdominal wall incisional scar. Abutment of loops of small bowel to the anterior peritoneal wall in the midline may represent adhesions. Musculoskeletal: Mild degenerative changes of the spine. No acute fracture. L2 hemangioma. IMPRESSION: 1. Postoperative changes of right hemicolectomy. Interval resolution of the previously seen pneumoperitoneum and ascites. Residual inflammatory changes of the omental with interval improvement compared to prior study. No drainable fluid collection or abscess. 2. No evidence of bowel obstruction. Contrast traverses into the ostomy. 3. Abutment of loops of small bowel to the anterior peritoneal wall in the midline likely representing a degree of adhesion. 4. Splenomegaly. 5. Mild-to-moderate renal parenchyma atrophy and cortical irregularity. Small nonobstructing bilateral renal calculi as well as probable bilateral small renal cysts. No hydronephrosis. Electronically  Signed   By: Anner Crete M.D.   On: 10/15/2016 02:43    Procedures Procedures (including critical care time)  Medications Ordered in ED  Medications  iopamidol (ISOVUE-300) 61 % injection (not administered)  ketorolac (TORADOL) 30 MG/ML injection 30 mg (not administered)  iopamidol (ISOVUE-300) 61 % injection 30 mL (30 mLs Oral Contrast Given 10/15/16 0052)  iopamidol (ISOVUE-300) 61 % injection 100 mL (80 mLs Intravenous Contrast Given 10/15/16 0206)  ondansetron (ZOFRAN) injection 4 mg (4 mg Intravenous Given 10/15/16 0232)     Final Clinical Impressions(s) / ED Diagnoses  Fever:  Likely viral in nature as the CXR and CT are normal.  No elevation of WBC count patient is well appearing with normal vitals.  No signs of sepsis.  No signs of bacterial infection.  I suspect he picked up an illness when he went to follow up with one of his physicians.    Very well appearing, normal  vital signs.  Based on history and exam patient has been appropriately medically screened and emergency conditions excluded. Patient is stable for discharge at this time. Strict return precautions given for fever that is persistent, weakness, cough, abdominal pain, rashes on the skin persistent vomiting, diarrhea, shortness of breath, chest pain, worsening symptomsor anyfurther problems or concerns.  Follow up with your PMD in 1 days for recheck.  Also inform your surgeon that you were here.     I personally performed the services described in this documentation, which was scribed in my presence. The recorded information has been reviewed and is accurate.      Veatrice Kells, MD 10/15/16 [phone]

## 2016-10-14 NOTE — ED Triage Notes (Signed)
Patient is complaining of fever 9102.8) at home, nausea, and vomiting that started tonight. Pt had abd surgery on Feb 12 for colon cancer and another surgery with an ileostomy on Feb 25. Pt has a PICC line on the right upper arm. Denies taking chemo or radiation therapy. Took Tylenol 1031m at 20:30.

## 2016-10-15 ENCOUNTER — Encounter (HOSPITAL_COMMUNITY): Payer: Self-pay

## 2016-10-15 ENCOUNTER — Emergency Department (HOSPITAL_COMMUNITY): Payer: Medicare Other

## 2016-10-15 DIAGNOSIS — B349 Viral infection, unspecified: Secondary | ICD-10-CM | POA: Diagnosis not present

## 2016-10-15 DIAGNOSIS — R509 Fever, unspecified: Secondary | ICD-10-CM | POA: Diagnosis not present

## 2016-10-15 DIAGNOSIS — J9811 Atelectasis: Secondary | ICD-10-CM | POA: Diagnosis not present

## 2016-10-15 LAB — URINALYSIS, ROUTINE W REFLEX MICROSCOPIC
Bacteria, UA: NONE SEEN
Bilirubin Urine: NEGATIVE
GLUCOSE, UA: NEGATIVE mg/dL
Ketones, ur: NEGATIVE mg/dL
LEUKOCYTES UA: NEGATIVE
NITRITE: NEGATIVE
PH: 5 (ref 5.0–8.0)
Protein, ur: NEGATIVE mg/dL
Specific Gravity, Urine: 1.011 (ref 1.005–1.030)
Squamous Epithelial / LPF: NONE SEEN

## 2016-10-15 LAB — CBC WITH DIFFERENTIAL/PLATELET
BASOS PCT: 0 %
Basophils Absolute: 0 10*3/uL (ref 0.0–0.1)
EOS ABS: 0.1 10*3/uL (ref 0.0–0.7)
EOS PCT: 1 %
HCT: 34.6 % — ABNORMAL LOW (ref 39.0–52.0)
Hemoglobin: 11.3 g/dL — ABNORMAL LOW (ref 13.0–17.0)
LYMPHS ABS: 0.2 10*3/uL — AB (ref 0.7–4.0)
Lymphocytes Relative: 5 %
MCH: 26.6 pg (ref 26.0–34.0)
MCHC: 32.7 g/dL (ref 30.0–36.0)
MCV: 81.4 fL (ref 78.0–100.0)
Monocytes Absolute: 0.2 10*3/uL (ref 0.1–1.0)
Monocytes Relative: 4 %
NEUTROS PCT: 90 %
Neutro Abs: 4.8 10*3/uL (ref 1.7–7.7)
PLATELETS: 75 10*3/uL — AB (ref 150–400)
RBC: 4.25 MIL/uL (ref 4.22–5.81)
RDW: 14.7 % (ref 11.5–15.5)
WBC: 5.3 10*3/uL (ref 4.0–10.5)

## 2016-10-15 MED ORDER — KETOROLAC TROMETHAMINE 30 MG/ML IJ SOLN
30.0000 mg | Freq: Once | INTRAMUSCULAR | Status: AC
  Administered 2016-10-15: 30 mg via INTRAVENOUS
  Filled 2016-10-15: qty 1

## 2016-10-15 MED ORDER — IOPAMIDOL (ISOVUE-300) INJECTION 61%
100.0000 mL | Freq: Once | INTRAVENOUS | Status: AC | PRN
  Administered 2016-10-15: 80 mL via INTRAVENOUS

## 2016-10-15 MED ORDER — IOPAMIDOL (ISOVUE-300) INJECTION 61%
INTRAVENOUS | Status: AC
  Filled 2016-10-15: qty 30

## 2016-10-15 MED ORDER — ONDANSETRON HCL 4 MG/2ML IJ SOLN
4.0000 mg | Freq: Once | INTRAMUSCULAR | Status: AC
  Administered 2016-10-15: 4 mg via INTRAVENOUS
  Filled 2016-10-15: qty 2

## 2016-10-15 MED ORDER — IOPAMIDOL (ISOVUE-300) INJECTION 61%
30.0000 mL | Freq: Once | INTRAVENOUS | Status: AC | PRN
  Administered 2016-10-15: 30 mL via ORAL

## 2016-10-15 MED ORDER — IOPAMIDOL (ISOVUE-300) INJECTION 61%
INTRAVENOUS | Status: AC
  Administered 2016-10-15: 30 mL via ORAL
  Filled 2016-10-15: qty 100

## 2016-10-15 MED ORDER — ONDANSETRON 8 MG PO TBDP: ORAL_TABLET | ORAL | 0 refills | Status: DC

## 2016-10-15 NOTE — ED Notes (Signed)
Patient transported to CT 

## 2016-10-19 ENCOUNTER — Other Ambulatory Visit (HOSPITAL_COMMUNITY)
Admission: AD | Admit: 2016-10-19 | Discharge: 2016-10-19 | Disposition: A | Discharge status: Home / Self Care | Payer: Medicare Other | Source: Skilled Nursing Facility | Attending: Surgery | Admitting: Surgery

## 2016-10-19 DIAGNOSIS — Z48815 Encounter for surgical aftercare following surgery on the digestive system: Secondary | ICD-10-CM | POA: Diagnosis not present

## 2016-10-19 DIAGNOSIS — I129 Hypertensive chronic kidney disease with stage 1 through stage 4 chronic kidney disease, or unspecified chronic kidney disease: Secondary | ICD-10-CM | POA: Diagnosis not present

## 2016-10-19 DIAGNOSIS — Z452 Encounter for adjustment and management of vascular access device: Secondary | ICD-10-CM | POA: Diagnosis not present

## 2016-10-19 DIAGNOSIS — N183 Chronic kidney disease, stage 3 (moderate): Secondary | ICD-10-CM | POA: Diagnosis not present

## 2016-10-19 DIAGNOSIS — K5090 Crohn's disease, unspecified, without complications: Secondary | ICD-10-CM | POA: Diagnosis not present

## 2016-10-19 DIAGNOSIS — Z432 Encounter for attention to ileostomy: Secondary | ICD-10-CM | POA: Diagnosis not present

## 2016-10-19 LAB — BASIC METABOLIC PANEL
Anion gap: 5 (ref 5–15)
BUN: 17 mg/dL (ref 6–20)
CO2: 22 mmol/L (ref 22–32)
CREATININE: 1.55 mg/dL — AB (ref 0.61–1.24)
Calcium: 6.4 mg/dL — CL (ref 8.9–10.3)
Chloride: 111 mmol/L (ref 101–111)
GFR calc non Af Amer: 43 mL/min — ABNORMAL LOW (ref >60)
GFR, EST AFRICAN AMERICAN: 50 mL/min — AB (ref >60)
Glucose, Bld: 109 mg/dL — ABNORMAL HIGH (ref 65–99)
Potassium: 3.9 mmol/L (ref 3.5–5.1)
SODIUM: 138 mmol/L (ref 135–145)

## 2016-10-20 ENCOUNTER — Other Ambulatory Visit: Payer: Self-pay | Admitting: Nephrology

## 2016-10-20 ENCOUNTER — Ambulatory Visit
Admission: RE | Admit: 2016-10-20 | Discharge: 2016-10-20 | Disposition: A | Discharge status: Home / Self Care | Payer: Medicare Other | Source: Ambulatory Visit | Attending: Nephrology | Admitting: Nephrology

## 2016-10-20 DIAGNOSIS — D631 Anemia in chronic kidney disease: Secondary | ICD-10-CM | POA: Diagnosis not present

## 2016-10-20 DIAGNOSIS — N183 Chronic kidney disease, stage 3 unspecified: Secondary | ICD-10-CM

## 2016-10-20 DIAGNOSIS — N2581 Secondary hyperparathyroidism of renal origin: Secondary | ICD-10-CM | POA: Diagnosis not present

## 2016-10-20 DIAGNOSIS — I719 Aortic aneurysm of unspecified site, without rupture: Secondary | ICD-10-CM | POA: Diagnosis not present

## 2016-10-20 DIAGNOSIS — N189 Chronic kidney disease, unspecified: Secondary | ICD-10-CM | POA: Diagnosis not present

## 2016-10-20 DIAGNOSIS — I129 Hypertensive chronic kidney disease with stage 1 through stage 4 chronic kidney disease, or unspecified chronic kidney disease: Secondary | ICD-10-CM | POA: Diagnosis not present

## 2016-10-20 DIAGNOSIS — R05 Cough: Secondary | ICD-10-CM | POA: Diagnosis not present

## 2016-10-20 DIAGNOSIS — C189 Malignant neoplasm of colon, unspecified: Secondary | ICD-10-CM | POA: Diagnosis not present

## 2016-10-20 DIAGNOSIS — K5090 Crohn's disease, unspecified, without complications: Secondary | ICD-10-CM | POA: Diagnosis not present

## 2016-10-20 DIAGNOSIS — K559 Vascular disorder of intestine, unspecified: Secondary | ICD-10-CM | POA: Diagnosis not present

## 2016-10-20 DIAGNOSIS — M109 Gout, unspecified: Secondary | ICD-10-CM | POA: Diagnosis not present

## 2016-10-20 DIAGNOSIS — K635 Polyp of colon: Secondary | ICD-10-CM | POA: Diagnosis not present

## 2016-10-20 DIAGNOSIS — I4891 Unspecified atrial fibrillation: Secondary | ICD-10-CM | POA: Diagnosis not present

## 2016-10-20 DIAGNOSIS — N200 Calculus of kidney: Secondary | ICD-10-CM | POA: Diagnosis not present

## 2016-10-23 DIAGNOSIS — Z432 Encounter for attention to ileostomy: Secondary | ICD-10-CM | POA: Diagnosis not present

## 2016-10-23 DIAGNOSIS — I129 Hypertensive chronic kidney disease with stage 1 through stage 4 chronic kidney disease, or unspecified chronic kidney disease: Secondary | ICD-10-CM | POA: Diagnosis not present

## 2016-10-23 DIAGNOSIS — K5090 Crohn's disease, unspecified, without complications: Secondary | ICD-10-CM | POA: Diagnosis not present

## 2016-10-23 DIAGNOSIS — N183 Chronic kidney disease, stage 3 (moderate): Secondary | ICD-10-CM | POA: Diagnosis not present

## 2016-10-23 DIAGNOSIS — Z48815 Encounter for surgical aftercare following surgery on the digestive system: Secondary | ICD-10-CM | POA: Diagnosis not present

## 2016-10-23 DIAGNOSIS — Z452 Encounter for adjustment and management of vascular access device: Secondary | ICD-10-CM | POA: Diagnosis not present

## 2016-10-27 ENCOUNTER — Other Ambulatory Visit (HOSPITAL_COMMUNITY): Payer: Self-pay | Admitting: *Deleted

## 2016-10-27 DIAGNOSIS — Z48815 Encounter for surgical aftercare following surgery on the digestive system: Secondary | ICD-10-CM | POA: Diagnosis not present

## 2016-10-27 DIAGNOSIS — Z452 Encounter for adjustment and management of vascular access device: Secondary | ICD-10-CM | POA: Diagnosis not present

## 2016-10-27 DIAGNOSIS — Z432 Encounter for attention to ileostomy: Secondary | ICD-10-CM | POA: Diagnosis not present

## 2016-10-27 DIAGNOSIS — N183 Chronic kidney disease, stage 3 (moderate): Secondary | ICD-10-CM | POA: Diagnosis not present

## 2016-10-27 DIAGNOSIS — I129 Hypertensive chronic kidney disease with stage 1 through stage 4 chronic kidney disease, or unspecified chronic kidney disease: Secondary | ICD-10-CM | POA: Diagnosis not present

## 2016-10-27 DIAGNOSIS — K5090 Crohn's disease, unspecified, without complications: Secondary | ICD-10-CM | POA: Diagnosis not present

## 2016-10-28 ENCOUNTER — Encounter (HOSPITAL_COMMUNITY)
Admission: RE | Admit: 2016-10-28 | Discharge: 2016-10-28 | Disposition: A | Discharge status: Home / Self Care | Payer: Medicare Other | Source: Ambulatory Visit | Attending: Nephrology | Admitting: Nephrology

## 2016-10-28 DIAGNOSIS — D631 Anemia in chronic kidney disease: Secondary | ICD-10-CM | POA: Diagnosis not present

## 2016-10-28 MED ORDER — SODIUM CHLORIDE 0.9 % IV SOLN
510.0000 mg | INTRAVENOUS | Status: DC
  Administered 2016-10-28: 12:00:00 510 mg via INTRAVENOUS
  Filled 2016-10-28: qty 17

## 2016-10-28 MED ORDER — HEPARIN SOD (PORK) LOCK FLUSH 100 UNIT/ML IV SOLN
INTRAVENOUS | Status: AC
  Administered 2016-10-28: 250 [IU]
  Filled 2016-10-28: qty 5

## 2016-10-28 NOTE — Discharge Instructions (Signed)

## 2016-10-29 ENCOUNTER — Encounter: Payer: Self-pay | Admitting: Cardiothoracic Surgery

## 2016-10-29 ENCOUNTER — Ambulatory Visit (INDEPENDENT_AMBULATORY_CARE_PROVIDER_SITE_OTHER): Payer: Medicare Other | Admitting: Cardiothoracic Surgery

## 2016-10-29 ENCOUNTER — Ambulatory Visit
Admission: RE | Admit: 2016-10-29 | Discharge: 2016-10-29 | Disposition: A | Discharge status: Home / Self Care | Payer: Medicare Other | Source: Ambulatory Visit | Attending: Cardiothoracic Surgery | Admitting: Cardiothoracic Surgery

## 2016-10-29 VITALS — BP 122/77 | HR 89 | Resp 16 | Ht 72.0 in | Wt 184.0 lb

## 2016-10-29 DIAGNOSIS — I712 Thoracic aortic aneurysm, without rupture: Secondary | ICD-10-CM

## 2016-10-29 DIAGNOSIS — I7121 Aneurysm of the ascending aorta, without rupture: Secondary | ICD-10-CM

## 2016-10-29 DIAGNOSIS — I7789 Other specified disorders of arteries and arterioles: Secondary | ICD-10-CM

## 2016-10-29 MED ORDER — GADOBENATE DIMEGLUMINE 529 MG/ML IV SOLN
9.0000 mL | Freq: Once | INTRAVENOUS | Status: AC | PRN
  Administered 2016-10-29: 9 mL via INTRAVENOUS

## 2016-10-29 NOTE — Progress Notes (Signed)
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 basal hypertrophy of the septum. Systolic function was normal. The estimated ejection fraction was in the range of 55% to 60%. Wall motion was normal; there were no regional wall motion abnormalities. Doppler parameters are consistent with abnormal left ventricular relaxation (grade 1 diastolic dysfunction). - Aortic valve: There was mild to moderate regurgitation directed eccentrically in the LVOT and towards the mitral anterior leaflet. - Aorta: Aortic root dimension: 50 mm (ED). - Ascending aorta: The ascending aorta was moderately dilated. - Left atrium: The atrium was mildly dilated.  Impressions:  - Compared to the prior study, there has been no significant interval change.  Echocardiography. M-mode, complete 2D, spectral Doppler, and color Doppler. Birthdate: Patient birthdate: 03-Jul-1941. Age: Patient is 76 yr old. Sex: Gender: male.  BMI: 28.2 kg/m^2. Blood pressure:   112/76 Patient status: Outpatient. Study  date: Study date: 09/06/2015. Study time: 11:58 AM. Location: [facility]  -------------------------------------------------------------------  ------------------------------------------------------------------- Left ventricle: The cavity size was normal. There was mild focal basal hypertrophy of the septum. Systolic function was normal. The estimated ejection fraction was in the range of 55% to 60%. Wall motion was normal; there were no regional wall motion abnormalities. Doppler parameters are consistent with abnormal left ventricular relaxation (grade 1 diastolic dysfunction).  ------------------------------------------------------------------- Aortic valve:  Trileaflet; mildly thickened, mildly calcified leaflets. Mobility was not restricted. Doppler: Transvalvular velocity was within the normal range. There was no stenosis. There was mild to moderate regurgitation directed eccentrically in the LVOT and towards the mitral anterior leaflet.  ------------------------------------------------------------------- Aorta: Ascending aorta: The ascending aorta was moderately dilated.  ------------------------------------------------------------------- Mitral valve:  Structurally normal valve.  Mobility was not restricted. Doppler: Transvalvular velocity was within the normal range. There was no evidence for stenosis. There was no regurgitation.  ------------------------------------------------------------------- Left atrium: The atrium was mildly dilated.  ------------------------------------------------------------------- Right ventricle: The cavity size was normal. Wall thickness was normal. Systolic function was normal.  ------------------------------------------------------------------- Pulmonic valve:  Structurally normal valve.  Cusp separation was normal. Doppler: Transvalvular velocity was within the normal range. There was no evidence for stenosis.  There was trivial regurgitation.  ------------------------------------------------------------------- Tricuspid valve:  Structurally normal valve.  Doppler: Transvalvular velocity was within the normal range. There was trivial regurgitation.  ------------------------------------------------------------------- Pulmonary artery:  The main pulmonary artery was normal-sized. Systolic pressure was within the normal range.  ------------------------------------------------------------------- Right atrium: The atrium was normal in size.  ------------------------------------------------------------------- Pericardium: There  was no pericardial effusion.  ------------------------------------------------------------------- Systemic veins: Inferior vena cava: The vessel was normal in size.  ------------------------------------------------------------------- Measurements  Left ventricle              Value    Reference LV ID, ED, PLAX chordal         46.9 mm   43 - 52 LV ID, ES, PLAX chordal         33.5 mm   23 - 38 LV fx shortening, PLAX chordal      29  %   >=29 LV PW thickness, ED           10.9 mm   --------- IVS/LV PW ratio, ED           1.17     <=1.3 Stroke volume, 2D            111  ml   --------- Stroke volume/bsa, 2D          50  ml/m^2 --------- LV ejection fraction, 1-p A4C      55  %   --------- LV end-diastolic volume, 2-p       129  ml   --------- LV end-systolic volume, 2-p       55  ml   --------- LV ejection fraction, 2-p        57  %   --------- Stroke volume, 2-p            74  ml   --------- LV end-diastolic volume/bsa, 2-p     58  ml/m^2 --------- LV end-systolic volume/bsa, 2-p     25  ml/m^2 --------- Stroke volume/bsa, 2-p          33.5 ml/m^2 --------- LV  e&', lateral              7.12 cm/s  --------- LV E/e&', lateral             9.28     --------- LV e&', medial              5.17 cm/s  --------- LV E/e&', medial             12.79    --------- LV e&', average              6.15 cm/s  --------- LV E/e&', average             10.76    ---------  Ventricular septum            Value    Reference IVS thickness, ED            12.7 mm   ---------  LVOT                   Value    Reference LVOT ID, S                25  mm   --------- LVOT area                4.91 cm^2  --------- LVOT ID                 25  mm   --------- LVOT peak velocity, S          95.5 cm/s  --------- LVOT mean velocity, S          65.7 cm/s  --------- LVOT VTI, S  22.7 cm   --------- Stroke volume (SV), LVOT DP       111.4 ml   --------- Stroke index (SV/bsa), LVOT DP      50.5 ml/m^2 ---------  Aortic valve               Value    Reference Aortic regurg pressure half-time     664  ms   ---------  Aorta                  Value    Reference Aortic root ID, ED            50  mm   --------- Ascending aorta ID, A-P, S        44  mm   ---------  Left atrium               Value    Reference LA ID, A-P, ES              43  mm   --------- LA ID/bsa, A-P              1.95 cm/m^2 <=2.2 LA volume, S               67  ml   --------- LA volume/bsa, S             30.4 ml/m^2 --------- LA volume, ES, 1-p A4C          69  ml   --------- LA volume/bsa, ES, 1-p A4C        31.3 ml/m^2 --------- LA volume, ES, 1-p  A2C          62  ml   --------- LA volume/bsa, ES, 1-p A2C        28.1 ml/m^2 ---------  Mitral valve               Value    Reference Mitral E-wave peak velocity       66.1 cm/s  --------- Mitral A-wave peak velocity       62.7 cm/s  --------- Mitral deceleration time     (H)   243  ms   150 - 230 Mitral E/A ratio, peak          1.1     ---------  Pulmonary arteries            Value    Reference PA pressure, S, DP            22  mm Hg <=30  Tricuspid valve             Value    Reference Tricuspid regurg peak velocity      217  cm/s  --------- Tricuspid peak RV-RA gradient      19  mm Hg ---------  Systemic veins              Value    Reference Estimated CVP              3   mm Hg ---------  Right ventricle             Value    Reference RV pressure, S, DP            22  mm Hg <=30 RV s&', lateral, S            11.4 cm/s  ---------  Legend: (L) and (H) mark values outside specified reference range.  ------------------------------------------------------------------- Prepared and Electronically  Authenticated by  Candee Furbish, M.D. 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 AVD (I35.9).  ------------------------------------------------------------------- History:  PMH: Acquired from the patient and from the patient&'s chart. Mild aortic regurgitation. Deep vein thrombosis. Pulmonary embolic disease. Risk factors: Former tobacco use.  ------------------------------------------------------------------- Study Conclusions  - Left ventricle: The cavity size was normal. Wall thickness  was normal. Systolic function was normal. The estimated ejection fraction was in the range of 50% to 55%. Doppler parameters are consistent with abnormal left ventricular relaxation (grade 1 diastolic dysfunction). - Aortic valve: Trileaflet. There was mild to moderate regurgitation directed centrally in the LVOT. - Aortic root: The aortic root was moderately dilated. Sinuses of Valsalva 51 mm, mid ascending aorta 43 mm.  ------------------------------------------------------------------- Labs, prior tests, procedures, and surgery: Echocardiography (January 2015).   EF was 50%. Ascending aorta 50 mm. Aortic root 47 mm.  Magnetic resonance angiography (January 2015).  The study demonstrated an aneurysm. Sinus of valsalva 75m, 42 sino-tubular junction, and ascending aorta 42 mm. Transthoracic echocardiography. M-mode, complete 2D, spectral Doppler, and color Doppler. Birthdate: Patient birthdate: 0Nov 30, 1942 Age: Patient is 76yr old. Sex: Gender: male. BMI: 28.8 kg/m^2. Blood pressure:   118/73 Patient status: Outpatient. Study date: Study date: 09/21/2014. Study time: 10:39 AM. Location: [facility]  -------------------------------------------------------------------  ------------------------------------------------------------------- Left ventricle: The cavity size was normal. Wall thickness was normal. Systolic function was normal. The estimated ejection fraction was in the range of 50% to 55%. Doppler parameters are consistent with abnormal left ventricular relaxation (grade 1 diastolic dysfunction). There was no evidence of elevated ventricular filling pressure by Doppler parameters.  ------------------------------------------------------------------- Aortic valve:  Trileaflet. Doppler:  There was no stenosis. There was mild to moderate regurgitation directed centrally in  the LVOT.  ------------------------------------------------------------------- Aorta: Aortic root: The aortic root was moderately dilated. Sinuses of Valsalva 51 mm, mid ascending aorta 43 mm.  ------------------------------------------------------------------- Mitral valve:  Structurally normal valve.  Leaflet separation was normal. Doppler: Transvalvular velocity was within the normal range. There was no evidence for stenosis. There was no regurgitation.  ------------------------------------------------------------------- Left atrium: The atrium was normal in size.  ------------------------------------------------------------------- Right ventricle: The cavity size was normal. Wall thickness was normal. Systolic function was normal.  ------------------------------------------------------------------- Pulmonic valve:  Structurally normal valve.  Cusp separation was normal. Doppler: Transvalvular velocity was within the normal range. There was no regurgitation.  ------------------------------------------------------------------- Tricuspid valve:  Structurally normal valve.  Leaflet separation was normal. Doppler: Transvalvular velocity was within the normal range. There was no regurgitation.  ------------------------------------------------------------------- Right atrium: The atrium was normal in size.  ------------------------------------------------------------------- Pericardium: There was no pericardial effusion.  Chronic Kidney Disease   Stage I     GFR >90  Stage II    GFR 60-89  Stage IIIA GFR 45-59  Stage IIIB GFR 30-44  Stage IV   GFR 15-29  Stage V    GFR  <15  Lab Results  Component Value Date   CREATININE 1.55 (H) 10/19/2016   Estimated Creatinine Clearance: 45.9 mL/min (A) (by C-G formula based on SCr of 1.55 mg/dL (H)).   Aortic Size Index=      5.0   /Body surface area is 2.13 meters squared. =2.34  < 2.75 cm/m2      4% risk per  year 2.75 to 4.25          8% risk per year > 4.25 cm/m2    20% risk per  year  Assessment / Plan:   Dilated Aortic root ascending aorta without history of Bicuspid aortic valve  or Marfan's or associated genetic disease, no family history of Aortic dissection or sudden unexplained death at young age.(sister with brain aneurysm and mother ruptured AAA). There has been very slow enlargement of aortic root over the past 9 years that patient has been followed. Patient should continue on beta blocker, BP control (which appears good ) good dental care (decrease risk of endocarditis). Consider elective root replacement at 5.5 cm. The dx and radiographic findings have been discussed with the patient and wife in detail.        The natural history of dilated ascending aorta is discussed again with the patient and his wife in detail. We reviewed the signs and symptoms of aortic dissection. I specifically cautioned him about doing any kind of heavy weight lifting as this is contraindicated in anybody with dilated aorta. Since last seen the patient's comorbidities have definitely increased with his recent bowel surgery and complications from that. He's now on anticoagulation with Eliquis due to atrial fibrillation  I will plan to see patient back in 8  months with follow up MRA of chest ( instead of CT due to Stage IIIa CKD)  Grace Isaac MD      [address] [place] [address] Office [phone]   Beeper [phone]

## 2016-10-30 DIAGNOSIS — I129 Hypertensive chronic kidney disease with stage 1 through stage 4 chronic kidney disease, or unspecified chronic kidney disease: Secondary | ICD-10-CM | POA: Diagnosis not present

## 2016-10-30 DIAGNOSIS — B379 Candidiasis, unspecified: Secondary | ICD-10-CM | POA: Diagnosis not present

## 2016-10-30 DIAGNOSIS — K5090 Crohn's disease, unspecified, without complications: Secondary | ICD-10-CM | POA: Diagnosis not present

## 2016-10-30 DIAGNOSIS — Z432 Encounter for attention to ileostomy: Secondary | ICD-10-CM | POA: Diagnosis not present

## 2016-10-30 DIAGNOSIS — N183 Chronic kidney disease, stage 3 (moderate): Secondary | ICD-10-CM | POA: Diagnosis not present

## 2016-10-30 DIAGNOSIS — Z452 Encounter for adjustment and management of vascular access device: Secondary | ICD-10-CM | POA: Diagnosis not present

## 2016-10-30 DIAGNOSIS — Z48815 Encounter for surgical aftercare following surgery on the digestive system: Secondary | ICD-10-CM | POA: Diagnosis not present

## 2016-10-31 ENCOUNTER — Encounter (HOSPITAL_COMMUNITY): Payer: Self-pay | Admitting: *Deleted

## 2016-10-31 ENCOUNTER — Emergency Department (HOSPITAL_COMMUNITY): Payer: Medicare Other

## 2016-10-31 ENCOUNTER — Emergency Department (HOSPITAL_COMMUNITY)
Admission: EM | Admit: 2016-10-31 | Discharge: 2016-10-31 | Disposition: A | Discharge status: Home / Self Care | Payer: Medicare Other | Attending: Emergency Medicine | Admitting: Emergency Medicine

## 2016-10-31 DIAGNOSIS — Z87891 Personal history of nicotine dependence: Secondary | ICD-10-CM | POA: Diagnosis not present

## 2016-10-31 DIAGNOSIS — N183 Chronic kidney disease, stage 3 (moderate): Secondary | ICD-10-CM | POA: Insufficient documentation

## 2016-10-31 DIAGNOSIS — R109 Unspecified abdominal pain: Secondary | ICD-10-CM | POA: Insufficient documentation

## 2016-10-31 DIAGNOSIS — N200 Calculus of kidney: Secondary | ICD-10-CM | POA: Diagnosis not present

## 2016-10-31 DIAGNOSIS — D649 Anemia, unspecified: Secondary | ICD-10-CM | POA: Diagnosis not present

## 2016-10-31 DIAGNOSIS — Z79899 Other long term (current) drug therapy: Secondary | ICD-10-CM | POA: Insufficient documentation

## 2016-10-31 DIAGNOSIS — Z932 Ileostomy status: Secondary | ICD-10-CM | POA: Diagnosis not present

## 2016-10-31 DIAGNOSIS — D631 Anemia in chronic kidney disease: Secondary | ICD-10-CM | POA: Insufficient documentation

## 2016-10-31 DIAGNOSIS — I129 Hypertensive chronic kidney disease with stage 1 through stage 4 chronic kidney disease, or unspecified chronic kidney disease: Secondary | ICD-10-CM | POA: Insufficient documentation

## 2016-10-31 DIAGNOSIS — Z9889 Other specified postprocedural states: Secondary | ICD-10-CM

## 2016-10-31 LAB — COMPREHENSIVE METABOLIC PANEL
ALBUMIN: 2.9 g/dL — AB (ref 3.5–5.0)
ALK PHOS: 136 U/L — AB (ref 38–126)
ALT: 13 U/L — AB (ref 17–63)
AST: 22 U/L (ref 15–41)
Anion gap: 5 (ref 5–15)
BILIRUBIN TOTAL: 0.6 mg/dL (ref 0.3–1.2)
BUN: 9 mg/dL (ref 6–20)
CO2: 22 mmol/L (ref 22–32)
CREATININE: 1.25 mg/dL — AB (ref 0.61–1.24)
Calcium: 7.5 mg/dL — ABNORMAL LOW (ref 8.9–10.3)
Chloride: 111 mmol/L (ref 101–111)
GFR calc Af Amer: >60 mL/min (ref >60)
GFR calc non Af Amer: 55 mL/min — ABNORMAL LOW (ref >60)
Glucose, Bld: 107 mg/dL — ABNORMAL HIGH (ref 65–99)
POTASSIUM: 4.4 mmol/L (ref 3.5–5.1)
Sodium: 138 mmol/L (ref 135–145)
TOTAL PROTEIN: 5.7 g/dL — AB (ref 6.5–8.1)

## 2016-10-31 LAB — CBC WITH DIFFERENTIAL/PLATELET
BASOS ABS: 0 10*3/uL (ref 0.0–0.1)
Basophils Relative: 1 %
EOS PCT: 1 %
Eosinophils Absolute: 0.1 10*3/uL (ref 0.0–0.7)
HCT: 32.3 % — ABNORMAL LOW (ref 39.0–52.0)
Hemoglobin: 10.2 g/dL — ABNORMAL LOW (ref 13.0–17.0)
LYMPHS PCT: 18 %
Lymphs Abs: 0.7 10*3/uL (ref 0.7–4.0)
MCH: 27.1 pg (ref 26.0–34.0)
MCHC: 31.6 g/dL (ref 30.0–36.0)
MCV: 85.7 fL (ref 78.0–100.0)
MONO ABS: 0.4 10*3/uL (ref 0.1–1.0)
Monocytes Relative: 10 %
Neutro Abs: 2.6 10*3/uL (ref 1.7–7.7)
Neutrophils Relative %: 70 %
PLATELETS: 145 10*3/uL — AB (ref 150–400)
RBC: 3.77 MIL/uL — ABNORMAL LOW (ref 4.22–5.81)
RDW: 15.4 % (ref 11.5–15.5)
WBC: 3.7 10*3/uL — ABNORMAL LOW (ref 4.0–10.5)

## 2016-10-31 LAB — URINALYSIS, ROUTINE W REFLEX MICROSCOPIC
Bilirubin Urine: NEGATIVE
Glucose, UA: NEGATIVE mg/dL
Hgb urine dipstick: NEGATIVE
KETONES UR: NEGATIVE mg/dL
Leukocytes, UA: NEGATIVE
NITRITE: NEGATIVE
Protein, ur: NEGATIVE mg/dL
Specific Gravity, Urine: 1.013 (ref 1.005–1.030)
pH: 5 (ref 5.0–8.0)

## 2016-10-31 LAB — D-DIMER, QUANTITATIVE: D-Dimer, Quant: 0.87 ug/mL-FEU — ABNORMAL HIGH (ref 0.00–0.50)

## 2016-10-31 MED ORDER — KETOROLAC TROMETHAMINE 30 MG/ML IJ SOLN
INTRAMUSCULAR | Status: AC
  Filled 2016-10-31: qty 1

## 2016-10-31 MED ORDER — OXYCODONE-ACETAMINOPHEN 5-325 MG PO TABS: 1.0000 | ORAL_TABLET | ORAL | 0 refills | Status: DC | PRN

## 2016-10-31 MED ORDER — KETOROLAC TROMETHAMINE 30 MG/ML IJ SOLN
30.0000 mg | Freq: Once | INTRAMUSCULAR | Status: AC
  Administered 2016-10-31: 30 mg via INTRAVENOUS
  Filled 2016-10-31: qty 1

## 2016-10-31 MED ORDER — SODIUM CHLORIDE 0.9 % IV BOLUS (SEPSIS)
500.0000 mL | Freq: Once | INTRAVENOUS | Status: AC
  Administered 2016-10-31: 500 mL via INTRAVENOUS

## 2016-10-31 MED ORDER — MORPHINE SULFATE (PF) 4 MG/ML IV SOLN
4.0000 mg | Freq: Once | INTRAVENOUS | Status: AC
  Administered 2016-10-31: 4 mg via INTRAVENOUS
  Filled 2016-10-31: qty 1

## 2016-10-31 MED ORDER — ONDANSETRON HCL 4 MG PO TABS: 4.0000 mg | ORAL_TABLET | Freq: Four times a day (QID) | ORAL | 0 refills | Status: DC

## 2016-10-31 MED ORDER — ONDANSETRON HCL 4 MG/2ML IJ SOLN
4.0000 mg | Freq: Once | INTRAMUSCULAR | Status: AC
  Administered 2016-10-31: 4 mg via INTRAVENOUS
  Filled 2016-10-31: qty 2

## 2016-10-31 MED ORDER — IOPAMIDOL (ISOVUE-300) INJECTION 61%
INTRAVENOUS | Status: AC
  Administered 2016-10-31: 30 mL via ORAL
  Filled 2016-10-31: qty 30

## 2016-10-31 MED ORDER — IOPAMIDOL (ISOVUE-300) INJECTION 61%
100.0000 mL | Freq: Once | INTRAVENOUS | Status: AC | PRN
  Administered 2016-10-31: 50 mL via INTRAVENOUS

## 2016-10-31 NOTE — ED Notes (Signed)
Pt given water to drink per EDP. Pt requested it so it would maybe help him urinate.

## 2016-10-31 NOTE — ED Triage Notes (Signed)
Pt c/o stabbing pain in his right flank area. Pt denies n/v/d. Pt has hx of kidney stones and recently had an ileostomy placed in February.

## 2016-10-31 NOTE — ED Provider Notes (Signed)
[facility] DEPT Provider Note   CSN: [id number] Arrival date & time: 10/31/16  0506     History   Chief Complaint Chief Complaint  Patient presents with  . Flank Pain    HPI Derek [name] is a 76 y.o. male.  Complains of right flank pain since 7 PM last night. No dysuria, hematuria, fever, sweats, chills, chest pain, dyspnea, vomiting. He does have a history of kidney stones. Also patient has a significant history for colon cancer. On 08/14/16 he had a resection of the transverse colon via laparoscopic surgery. This was complicated by an anastomosis leak which led to a exploratory lap, right colectomy, ileostomy on 08/27/16. His ileostomy is draining well and he has no anterior abdominal pain. Pain is worse with positioning and deep breath.       Past Medical History:  Diagnosis Date  . Anemia   . Anxiety   . Aortic insufficiency    a. mild-mod by echo 09/2015.  . Arthritis    "knees; left shoulder" (09/21/2013)  . Ascending aortic aneurysm (HCC)    a. last measurement 5.3 cm 03/2016 -> f/u planned 09/2015 to continue to follow.  . Atrial fibrillation ([place])   . B12 deficiency    takes Vit 12 shot every 14days   . Cataract   . CKD (chronic kidney disease) stage 3, GFR 30-59 ml/min 08/22/2011  . Clotting disorder ([place])   . Crohn's disease ([place])   . Depression   . Enlarged prostate   . Enteric hyperoxaluria ([place]) 02/21/2016  . GERD (gastroesophageal reflux disease)    takes Omeprazole daily  . Gout    takes Uloric and Colchicine daily  . Heart murmur   . Hepatitis C 1978   negtive RNA load - spontaneously cleared  . Hiatal hernia   . History of blood transfusion 1978; 1990's; ?   "w/bowel resection; S/P allupurinol; ?" (09/21/2013)  . History of colon polyps   . History of kidney stones   . History of MRSA infection 2010  . History of pulmonary embolism 2006   both legs and both lungs /notes 08/26/2008 (09/21/2013)  . History of small bowel obstruction   .  History of staph infection 1978  . Hyperoxaluria (HCC)    Intestinal  . Hypertension   . Insomnia    takes Trazodone nightly  . Internal hemorrhoids   . LV dysfunction    a. h/o EF 45-50% in 2015, normalized on subsequent echoes.  . Nephrolithiasis   . Pancreatitis 2010   elevated lipase and amylase, stranding in tail of pancreas, ? from Humira  . Pancytopenia    Hx of  . Peripheral neuropathy (HCC)    takes Gabapentin daily  . Pneumonia    hx of   . Post-traumatic stress syndrome    takes Paxil nightly  . PTSD (post-traumatic stress disorder)   . Pulmonary nodule    a. 67m by CT 05/2015, recommended f/u 6-12 months.  . RLS (restless legs syndrome)   . Rosacea conjunctivitis(372.31)    takes Minocin daily  . Secondary hyperparathyroidism ([place] 02/21/2016  . Sinus bradycardia   . Skin cancer    "cut/burned off left ear and face" (09/21/2013)  . Small bowel obstruction   . Thrombocytopenia ([place]    hx of    Patient Active Problem List   Diagnosis Date Noted  . Bowel perforation ([facility] 08/27/2016  . Colonic ischemia ([place] 08/27/2016  . Pain of upper abdomen   . Chronic anticoagulation 08/18/2016  .  Sinus bradycardia 08/17/2016  . S/P colon resection 08/14/2016  . Adenomatous polyp of transverse colon s/p partial colectomy 08/14/2016 08/14/2016  . Infectious diarrhea   . Chronic diarrhea   . Essential hypertension 05/27/2016  . Hypotension 05/27/2016  . Hypomagnesemia 05/27/2016  . Peripheral neuropathic pain 05/26/2016  . GERD (gastroesophageal reflux disease) 05/26/2016  . PAF (paroxysmal atrial fibrillation) ([place]) 05/26/2016  . Lung nodule 05/12/2016  . SBO (small bowel obstruction) 05/11/2016  . Enteric hyperoxaluria ([place]) 02/21/2016  . Secondary hyperparathyroidism ([place]) 02/21/2016  . Pain in joint, shoulder region 10/12/2013  . Decreased range of motion of left shoulder 10/12/2013  . Muscle weakness (generalized) 10/12/2013  . Restriction of joint motion  10/12/2013  . S/P shoulder replacement 09/21/2013  . Cardiomyopathy ([place]) 09/07/2013  . Anxiety 08/22/2011  . CKD (chronic kidney disease) stage 3, GFR 30-59 ml/min 08/22/2011  . Thrombocytopenia ([place]) 08/22/2011  . CORONARY ATHEROSCLEROSIS NATIVE CORONARY ARTERY 06/25/2010  . Ascending aortic aneurysm ([place]) 06/26/2009  . Crohn's ileocolitis ([place]) 06/26/2009  . B12 DEFICIENCY 05/09/2008  . Gout 05/09/2008  . POST TRAUMATIC STRESS SYNDROME 05/09/2008  . GERD 05/09/2008  . HEPATITIS C Ab positive RNA neg, HX OF 05/09/2008  . PULMONARY EMBOLISM, HX OF 05/09/2008  . GASTRITIS, HX OF 05/09/2008    Past Surgical History:  Procedure Laterality Date  . ANKLE SURGERY Right   . APPENDECTOMY  1978  . BOWEL RESECTION  1978 X 2  . CARDIOVERSION N/A 05/29/2016   Procedure: CARDIOVERSION;  Surgeon: Sanda Klein, MD;  Location: [facility];  Service: Cardiovascular;  Laterality: N/A;  . CHOLECYSTECTOMY    . COLON RESECTION N/A 08/14/2016   Procedure: LAPAROSCOPIC RESECTION TRANSVERSE COLON;  Surgeon: [name], MD;  Location: [facility];  Service: General;  Laterality: N/A;  . COLON SURGERY    . COLONOSCOPY    . ESOPHAGOGASTRODUODENOSCOPY    . EYE SURGERY     cataract surgery bilateral  . FOOT SURGERY Right    "took gout out"  . HEMICOLECTOMY Right   . ILEOCECETOMY  1978   [name] 05/10/2000  (09/21/2013)  . ILEOSTOMY    . INGUINAL HERNIA REPAIR Right   . KNEE ARTHROSCOPY Left   . LAPAROTOMY N/A 08/27/2016   Procedure: EXPLORATORYLAPAROTOMY, LYSIS OF ADHESIONS, ILEOSTOMY, RIGHT COLECTOMY;  Surgeon: [name], MD;  Location: [facility];  Service: General;  Laterality: N/A;  . LIGAMENT REPAIR Left   . TEE WITHOUT CARDIOVERSION N/A 05/29/2016   Procedure: TRANSESOPHAGEAL ECHOCARDIOGRAM (TEE);  Surgeon: Sanda Klein, MD;  Location: [facility];  Service: Cardiovascular;  Laterality: N/A;  . TOTAL SHOULDER ARTHROPLASTY Left 09/21/2013  . TOTAL SHOULDER ARTHROPLASTY Left 09/21/2013    Procedure: LEFT TOTAL SHOULDER ARTHROPLASTY;  Surgeon: Marin Shutter, MD;  Location: [facility];  Service: Orthopedics;  Laterality: Left;       Home Medications    Prior to Admission medications   Medication Sig Start Date End Date Taking? Authorizing Provider  Acidophilus Lactobacillus CAPS Take 1 capsule by mouth daily. 06/08/16  Yes [name], MD  amiodarone (PACERONE) 200 MG tablet Take 1 tablet (200 mg total) by mouth 2 (two) times daily. 07/02/16  Yes Brittainy Erie Noe, PA-C  calcium carbonate (TUMS - DOSED IN MG ELEMENTAL CALCIUM) 500 MG chewable tablet Chew 2 tablets by mouth 2 (two) times daily.   Yes Historical Provider, MD  carboxymethylcellulose (REFRESH PLUS) 0.5 % SOLN Place 1 drop into both eyes 4 (four) times daily.   Yes Historical Provider, MD  cholecalciferol (  VITAMIN D) 1000 units tablet Take 2,000 Units by mouth daily.    Yes Historical Provider, MD  cycloSPORINE (RESTASIS) 0.05 % ophthalmic emulsion Place 1 drop into both eyes 2 (two) times daily.    Yes Historical Provider, MD  ELIQUIS 5 MG TABS tablet Take 5 mg by mouth 2 (two) times daily.   Yes Historical Provider, MD  febuxostat (ULORIC) 40 MG tablet Take 80 mg by mouth daily.   Yes Historical Provider, MD  gabapentin (NEURONTIN) 100 MG capsule Take 100-200 mg by mouth 2 (two) times daily. Pt takes one capsule in the morning and two at night.   Yes Historical Provider, MD  loperamide (IMODIUM) 2 MG capsule Take 4 mg by mouth 2 (two) times daily.   Yes Historical Provider, MD  magnesium oxide (MAGNESIUM-OXIDE) 400 (241.3 Mg) MG tablet Take 800 mg by mouth 2 (two) times daily.    Yes Historical Provider, MD  Multiple Vitamin (MULTIVITAMIN WITH MINERALS) TABS tablet Take 1 tablet by mouth daily.   Yes Historical Provider, MD  omeprazole (PRILOSEC) 20 MG capsule Take 20 mg by mouth daily.     Yes Historical Provider, MD  PARoxetine (PAXIL) 40 MG tablet Take 20 mg by mouth at bedtime.   Yes Historical Provider, MD    potassium phosphate, monobasic, (K-PHOS ORIGINAL) 500 MG tablet Take 1,000 mg by mouth 2 (two) times daily with a meal.    Yes Historical Provider, MD  terazosin (HYTRIN) 2 MG capsule Take 2 mg by mouth at bedtime.    Yes Historical Provider, MD  traZODone (DESYREL) 150 MG tablet Take 75-150 mg by mouth at bedtime as needed for sleep.    Yes Historical Provider, MD  cyanocobalamin (,VITAMIN B-12,) 1000 MCG/ML injection Inject 1,000 mcg into the muscle every 14 (fourteen) days.    Historical Provider, MD  ondansetron (ZOFRAN) 4 MG tablet Take 1 tablet (4 mg total) by mouth every 6 (six) hours. 10/31/16   Nat Christen, MD  oxyCODONE-acetaminophen (PERCOCET) 5-325 MG tablet Take 1-2 tablets by mouth every 4 (four) hours as needed. 10/31/16   Nat Christen, MD    Family History Family History  Problem Relation Age of Onset  . Kidney disease Father   . Hypertension Father   . Aneurysm Mother   . Aneurysm Sister   . Esophageal cancer Neg Hx   . Stomach cancer Neg Hx   . Rectal cancer Neg Hx     Social History Social History  Substance Use Topics  . Smoking status: Former Smoker    Packs/day: 2.00    Years: 20.00    Types: Cigarettes    Quit date: 08/04/1975  . Smokeless tobacco: Former Systems developer    Types: [place] date: 08/03/1978     Comment: 09/21/2013 "quit smoking in the late 1970's; stopped chewing couple years after I quit smoking"  . Alcohol use No     Allergies   Lorazepam and Humira [adalimumab]   Review of Systems Review of Systems  All other systems reviewed and are negative.    Physical Exam Updated Vital Signs BP 133/63   Pulse 69   Temp 97.6 F (36.4 C) (Oral)   Resp 18   Ht 6' (1.829 m)   Wt 185 lb (83.9 kg)   SpO2 97%   BMI 25.09 kg/m   Physical Exam  Constitutional: He is oriented to person, place, and time. He appears well-developed and well-nourished.  No acute distress  HENT:  Head: Normocephalic  and atraumatic.  Eyes: Conjunctivae are normal.   Neck: Neck supple.  Cardiovascular: Normal rate and regular rhythm.   Pulmonary/Chest: Effort normal and breath sounds normal.  Abdominal: Soft. Bowel sounds are normal.  Ileostomy draining semi-soft brown stool in right abdomen  Genitourinary:  Genitourinary Comments: Tender right flank  Musculoskeletal: Normal range of motion.  Neurological: He is alert and oriented to person, place, and time.  Skin: Skin is warm and dry.  Psychiatric: He has a normal mood and affect. His behavior is normal.  Nursing note and vitals reviewed.    ED Treatments / Results  Labs (all labs ordered are listed, but only abnormal results are displayed) Labs Reviewed  COMPREHENSIVE METABOLIC PANEL - Abnormal; Notable for the following:       Result Value   Glucose, Bld 107 (*)    Creatinine, Ser 1.25 (*)    Calcium 7.5 (*)    Total Protein 5.7 (*)    Albumin 2.9 (*)    ALT 13 (*)    Alkaline Phosphatase 136 (*)    GFR calc non Af Amer 55 (*)    All other components within normal limits  CBC WITH DIFFERENTIAL/PLATELET - Abnormal; Notable for the following:    WBC 3.7 (*)    RBC 3.77 (*)    Hemoglobin 10.2 (*)    HCT 32.3 (*)    Platelets 145 (*)    All other components within normal limits  D-DIMER, QUANTITATIVE (NOT AT [facility]) - Abnormal; Notable for the following:    D-Dimer, Quant 0.87 (*)    All other components within normal limits  URINALYSIS, ROUTINE W REFLEX MICROSCOPIC    EKG  EKG Interpretation None       Radiology Ct Abdomen Pelvis W Contrast  Result Date: 10/31/2016 CLINICAL DATA:  Right flank pain. History kidney stones. Transverse colectomy 08/14/2016. EXAM: CT ABDOMEN AND PELVIS WITH CONTRAST TECHNIQUE: Multidetector CT imaging of the abdomen and pelvis was performed using the standard protocol following bolus administration of intravenous contrast. CONTRAST:  44m ISOVUE-300 IOPAMIDOL (ISOVUE-300) INJECTION 61% COMPARISON:  10/15/2016, 08/27/2016, 12/28/2008 FINDINGS:  Lower chest: Bibasilar atelectasis. Stable 5 mm left lower lobe pulmonary nodule unchanged compared with 12/28/2008. Hepatobiliary: No focal liver abnormality is seen. Status post cholecystectomy. No biliary dilatation. Pancreas: Unremarkable. No pancreatic ductal dilatation or surrounding inflammatory changes. Spleen: Mild splenomegaly without focal abnormality. Adrenals/Urinary Tract: Normal adrenal glands. Mild bilateral renal atrophy. Bilateral nonobstructing renal calculi. No hydronephrosis. 2 cm hypodense, fluid attenuating right medial interpolar renal mass most consistent with a cyst. 8 mm hypodense, fluid attenuating lateral interpolar left renal mass most consistent with a cyst. Symmetric uptake and excretion of contrast bilaterally. Mild anterior bladder wall thickening and adjacent inflammatory changes. Abutment of multiple small bowel loops along the left anterior mid abdomen unchanged from the prior examination may reflect adhesions. Stomach/Bowel: Postsurgical changes from right hemicolectomy with right lower quadrant ileostomy. No bowel dilatation to suggest obstruction. No bowel wall thickening. Mile edema within the omentum similar to the prior examination likely postsurgical. No focal fluid collection or hematoma. No abdominal or pelvic free fluid. Vascular/Lymphatic: Normal caliber abdominal aorta with mild atherosclerosis. No lymphadenopathy. Reproductive: Prostate is unremarkable. Other: Postsurgical changes of the anterior abdominal wall. No fluid collection or hematoma. Musculoskeletal: No acute osseous abnormality. No lytic or sclerotic osseous lesion. IMPRESSION: 1. No acute abdominal or pelvic pathology. 2. Bilateral nonobstructing renal calculi.  No hydronephrosis. 3. Abutment of multiple small bowel loops along the left anterior mid abdomen  unchanged from the prior examination may reflect adhesions. No bowel obstruction. 4. Stable splenomegaly. Electronically Signed   By: Kathreen Devoid    On: 10/31/2016 09:19   Mr Angiogram Chest W Wo Contrast  Result Date: 10/29/2016 CLINICAL DATA:  Aortic aneurysm EXAM: MRA CHEST WITH CONTRAST TECHNIQUE: Multiplanar, multiecho pulse sequences of the chest were obtained with intravenous contrast. Angiographic images of chest were obtained using MRA technique with intravenous contrast. CONTRAST:  9 cc MultiHance COMPARISON:  03/11/2016 FINDINGS: Maximal diameter of the ascending aorta at the sinus of all saw above, sino-tubular junction, and ascending aorta are 5.3 cm, 4.4 cm, and 4.4 cm, respectively. This compares with 5.3 cm, 4.4 cm, and 4.3 cm on the prior study respectively. These measurements are not significantly changed. There is no evidence of intramural hematoma or dissection. Great vessels are patent within the confines of the exam. Vertebral arteries are also grossly patent. No obvious pulmonary thromboembolism. No obvious mediastinal mass effect. There is subsegmental atelectasis towards the lung bases. Visualized abdominal organs are unremarkable. IMPRESSION: Stable aneurysmal dilatation of the ascending aorta at 4.4 cm. Recommend annual imaging followup by CTA or MRA. This recommendation follows 2010 ACCF/AHA/AATS/ACR/ASA/SCA/SCAI/SIR/STS/SVM Guidelines for the Diagnosis and Management of Patients with Thoracic Aortic Disease. Circulation. 2010; 121: Z747-F595 Electronically Signed   By: Marybelle Killings M.D.   On: 10/29/2016 16:58    Procedures Procedures (including critical care time)  Medications Ordered in ED Medications  ketorolac (TORADOL) 30 MG/ML injection 30 mg (30 mg Intravenous Given 10/31/16 0630)  iopamidol (ISOVUE-300) 61 % injection (30 mLs Oral Contrast Given 10/31/16 0814)  iopamidol (ISOVUE-300) 61 % injection 100 mL (50 mLs Intravenous Contrast Given 10/31/16 0843)  sodium chloride 0.9 % bolus 500 mL (500 mLs Intravenous New Bag/Given 10/31/16 0927)  morphine 4 MG/ML injection 4 mg (4 mg Intravenous Given 10/31/16 0927)   ondansetron (ZOFRAN) injection 4 mg (4 mg Intravenous Given 10/31/16 3967)     Initial Impression / Assessment and Plan / ED Course  I have reviewed the triage vital signs and the nursing notes.  Pertinent labs & imaging results that were available during my care of the patient were reviewed by me and considered in my medical decision making (see chart for details).     Pain is in the right flank. No anterior abdominal pain. Will obtain CT to rule out kidney stone.   1100:  Recheck. Pain is improved. No acute abdomen. Urinalysis negative. CT abdomen/pelvis show no acute changes. Discussed with patient and his wife. Discharge medications Percocet and Zofran 4 mg. Final Clinical Impressions(s) / ED Diagnoses   Final diagnoses:  Right flank pain  Anemia, unspecified type  History of ileostomy    New Prescriptions New Prescriptions   ONDANSETRON (ZOFRAN) 4 MG TABLET    Take 1 tablet (4 mg total) by mouth every 6 (six) hours.   OXYCODONE-ACETAMINOPHEN (PERCOCET) 5-325 MG TABLET    Take 1-2 tablets by mouth every 4 (four) hours as needed.     Nat Christen, MD 10/31/16 1106

## 2016-10-31 NOTE — ED Notes (Signed)
Pt unable to urinate at this time. Pt is aware that we need a urine specimen

## 2016-10-31 NOTE — ED Provider Notes (Signed)
MSE was initiated and I personally evaluated the patient and placed orders (if any) at  6:01 AM on October 31, 2016.  The patient appears stable so that the remainder of the MSE may be completed by another provider.  Patient reports he has had right sided flank pain for the past few days. He states about 7 PM this evening it started getting worse. He describes it as sharp and constant however deep breathing, changing positions, or standing upright make it feel worse. He denies nausea, vomiting, diaphoresis, shortness of breath, abdominal pain hematuria. He denies any injury. He had surgery on January 12 with laparoscopic resection of the transverse colon by Dr. [name]. It was complicated by an anastomosis leak and he had exploratory laparotomy and ileostomy done on January 25 with a right colectomy. He reports he is recovering well from that surgery. He states he is active around the house. He is on Eliquis.   Patient is alert and cooperative. On abdomen exam he is nontender to palpation, his ileostomy is present. Patient's noted to have a lot of pain when he changes positions. When I examine his back he is tender in the midline at the upper lumbar lower thoracic spine junction and states it causes a shooting pain into his right flank area. He is also tender in the right posterior inferior border of his rib cage and mildly in the right flank. He does not have flank pain to percussion.  Prior Labs reviewed and patient is noted to have a stable mild baseline renal insufficiency. He was given IV Toradol as a one-time dose.  On review of his prior CT scans he does have small 3 mm bilateral renal stones present. Patient states however this feels different from his kidney stones. He states with those he normally feels like he can't urinate. Patient also recently had a MRA of the chest done to evaluate his known aortic aneurysm. It showed it was stable and at 4.4 cm.  Patient will be turned over to the day  doctors at change of shift for further evaluation.  Rolland Porter, MD, Barbette Or, MD 10/31/16 [phone]

## 2016-10-31 NOTE — Discharge Instructions (Signed)
CT scan showed no life-threatening condition. Urinalysis looked good. Prescription for pain and nausea medication. Return for fever, chills, trouble with your urination, dehydration

## 2016-11-03 DIAGNOSIS — Z48815 Encounter for surgical aftercare following surgery on the digestive system: Secondary | ICD-10-CM | POA: Diagnosis not present

## 2016-11-03 DIAGNOSIS — K5090 Crohn's disease, unspecified, without complications: Secondary | ICD-10-CM | POA: Diagnosis not present

## 2016-11-03 DIAGNOSIS — Z432 Encounter for attention to ileostomy: Secondary | ICD-10-CM | POA: Diagnosis not present

## 2016-11-03 DIAGNOSIS — N183 Chronic kidney disease, stage 3 (moderate): Secondary | ICD-10-CM | POA: Diagnosis not present

## 2016-11-03 DIAGNOSIS — I129 Hypertensive chronic kidney disease with stage 1 through stage 4 chronic kidney disease, or unspecified chronic kidney disease: Secondary | ICD-10-CM | POA: Diagnosis not present

## 2016-11-03 DIAGNOSIS — Z452 Encounter for adjustment and management of vascular access device: Secondary | ICD-10-CM | POA: Diagnosis not present

## 2016-11-04 ENCOUNTER — Encounter (HOSPITAL_COMMUNITY)
Admission: RE | Admit: 2016-11-04 | Discharge: 2016-11-04 | Disposition: A | Discharge status: Home / Self Care | Payer: Medicare Other | Source: Ambulatory Visit | Attending: Nephrology | Admitting: Nephrology

## 2016-11-04 DIAGNOSIS — D631 Anemia in chronic kidney disease: Secondary | ICD-10-CM | POA: Diagnosis not present

## 2016-11-04 MED ORDER — HEPARIN SOD (PORK) LOCK FLUSH 100 UNIT/ML IV SOLN
250.0000 [IU] | INTRAVENOUS | Status: AC | PRN
  Administered 2016-11-04: 250 [IU]

## 2016-11-04 MED ORDER — HEPARIN SOD (PORK) LOCK FLUSH 100 UNIT/ML IV SOLN
INTRAVENOUS | Status: AC
  Administered 2016-11-04: 13:00:00 250 [IU]
  Filled 2016-11-04: qty 5

## 2016-11-04 MED ORDER — SODIUM CHLORIDE 0.9 % IV SOLN
510.0000 mg | INTRAVENOUS | Status: DC
  Administered 2016-11-04: 13:00:00 510 mg via INTRAVENOUS
  Filled 2016-11-04: qty 17

## 2016-11-04 NOTE — Progress Notes (Signed)
Pt tolerated 2nd Feraheme infusion without difficulty. Denied dizziness, nausea, rashes or shortness of breath prior to discharge home. VSS.

## 2016-11-05 ENCOUNTER — Ambulatory Visit (INDEPENDENT_AMBULATORY_CARE_PROVIDER_SITE_OTHER): Payer: Medicare Other | Admitting: Internal Medicine

## 2016-11-05 ENCOUNTER — Encounter: Payer: Self-pay | Admitting: Internal Medicine

## 2016-11-05 VITALS — BP 120/80 | HR 67 | Ht 72.0 in | Wt 188.1 lb

## 2016-11-05 DIAGNOSIS — I428 Other cardiomyopathies: Secondary | ICD-10-CM | POA: Diagnosis not present

## 2016-11-05 DIAGNOSIS — I480 Paroxysmal atrial fibrillation: Secondary | ICD-10-CM | POA: Diagnosis not present

## 2016-11-05 DIAGNOSIS — I351 Nonrheumatic aortic (valve) insufficiency: Secondary | ICD-10-CM

## 2016-11-05 DIAGNOSIS — I429 Cardiomyopathy, unspecified: Secondary | ICD-10-CM | POA: Diagnosis not present

## 2016-11-05 DIAGNOSIS — I712 Thoracic aortic aneurysm, without rupture: Secondary | ICD-10-CM

## 2016-11-05 DIAGNOSIS — I7121 Aneurysm of the ascending aorta, without rupture: Secondary | ICD-10-CM

## 2016-11-05 LAB — T4, FREE: FREE T4: 1.51 ng/dL (ref 0.82–1.77)

## 2016-11-05 LAB — TSH: TSH: 4.56 u[IU]/mL — AB (ref 0.450–4.500)

## 2016-11-05 MED ORDER — ELIQUIS 5 MG PO TABS: 5.0000 mg | ORAL_TABLET | Freq: Two times a day (BID) | ORAL | 3 refills | Status: DC

## 2016-11-05 MED ORDER — AMIODARONE HCL 200 MG PO TABS: 200.0000 mg | ORAL_TABLET | Freq: Every day | ORAL | 3 refills | Status: DC

## 2016-11-05 NOTE — Patient Instructions (Signed)
Medication Instructions:  Decrease amiodarone to 200 mg daily  Labwork: TSH/Free T4 today  Testing/Procedures: Your physician has requested that you have an echocardiogram. Echocardiography is a painless test that uses sound waves to create images of your heart. It provides your doctor with information about the size and shape of your heart and how well your heart's chambers and valves are working. This procedure takes approximately one hour. There are no restrictions for this procedure.  Your physician has recommended that you have a pulmonary function test. Pulmonary Function Tests are a group of tests that measure how well air moves in and out of your lungs.    Follow-Up: Your physician recommends that you schedule a follow-up appointment in: 3-4 months with Dr End.        If you need a refill on your cardiac medications before your next appointment, please call your pharmacy.

## 2016-11-05 NOTE — Progress Notes (Signed)
Follow-up Outpatient Visit Date: 11/05/2016  Primary Care Provider: Purvis Kilts, [address] [place] [address]  Chief Complaint: Follow-up paroxysmal atrial fibrillation, TAA with aortic insufficiency, and cardiomyopathy  HPI:  [name] is a 76 y.o. year-old male with history of aortic root aneurysm followed by Dr. Servando Snare, aortic regurgitation, cardiomyopathy, recent episode of paroxysmal atrial fibrillation, HTN, hyperlipidemia, chronic kidney disease, and remote PE, who presents for follow-up. He was previously followed by Dr. Aundra Dubin and was last seen in our office by [name] in 06/2016. He presented to [facility] for for scheduled colectomy in 05/2016 and was found to be in atrial fibrillation (asymptomatic). He underwent successful DCCV and has continued on amiodarone 200 mg BID and apixaban 5 mg BID. He was noted to have mildly reduced LVEF by echo at that time. He underwent transverse colectomy by Dr. [name] on 08/14/16. He was readmitted later that month with abdominal pain and was found to have breakdown of recent surgical anastomosis in the right upper abdomen requiring exploratory laparotomy, right colectomy, and Derek [name] ileostomy. His recovery was complicated by subsequent viral infection (wife reports it was the flu) about 3 weeks ago.  Today, [name] reports feeling well. His energy is not quite back to normal, though it seems to be gradually improving. He denies chest pain, shortness of breath, palpitations, and lightheadedness. He has occasional ankle and foot edema, which has been a longstanding problem for him. He is tolerating his medications well, including amiodarone and apixaban. He has not had any significant bleeding. He reports that Dr. [name] is considering ostomy take-down around August or September. The patient was seen by Dr. Servando Snare for monitoring of his aortic root aneurysm last week. Recent MRI showed relative stability,  measuring up to 5.3 cm at the sinuses of Valsalva. 8 month clinical and MRA follow-up is planned.  The patient continues to receive IV fluid at home via a PICC line due to high output from his ostomy and concern for intravascular volume depletion worsening his chronic kidney disease. This is monitored closely by Drs. Deterding and [name].  --------------------------------------------------------------------------------------------------  Cardiovascular History & Procedures: Cardiovascular Problems:  TAA  Aortic regurgitation  Non-ischemic cardiomyopathy  Paroxysmal atrial fibrillation  Remote pulmonary embolism  Risk Factors:  Hypertension, hyperlipidemia, male gender, and age > 37  Cath/PCI:  None  CV Surgery:  None  EP Procedures and Devices:  None  Non-Invasive Evaluation(s):  MRA chest (10/29/16): Stable TAA measuring 5.3 cm at the sinuses of Valsalva and 4.4 cm above the sinotubular junction.  TEE (05/29/17): LVEF 45-50%. Mild to moderate AI. Moderately dilated aortic root. Dilated left atrium without thrombus. Dilated right atrium. No PFO.  TTE (05/27/17): Normal LV size with moderate LVH. LVEF 45-50%. Mild AI. Aortic root measures up to 5.1 cm. Mitral annular calcification. Normal RV size and function. Normal PA pressure.  TTE (09/06/15): Normal LV size with focal basal septal hypertrophy. LVEF 55-60% with grade 1 diastolic dysfunction. Mild to moderate AI with aortic root measuring up to 5.0 cm. Mild LA enlargement. Normal RV size and function.  Pharmacologic MPI (09/11/13): Normal apical thinning. No ischemia or scar. LVEF 50%.  Recent CV Pertinent Labs: Lab Results  Component Value Date   CHOL 93 04/19/2015   HDL 31.60 (L) 04/19/2015   LDLDIRECT 28.0 04/19/2015   TRIG 143 09/07/2016   CHOLHDL 3 04/19/2015   INR 1.03 05/26/2016   INR 1.70 (L) 01/18/2006   K 4.4 10/31/2016   K 4.5 11/23/2012  MG 1.7 09/10/2016   BUN 9 10/31/2016   BUN 25.3 11/23/2012     CREATININE 1.25 (H) 10/31/2016   CREATININE 1.8 (H) 09/25/2015   CREATININE 1.9 (H) 11/23/2012    Past medical and surgical history were reviewed and updated in EPIC.  Outpatient Encounter Prescriptions as of 11/05/2016  Medication Sig  . Acidophilus Lactobacillus CAPS Take 1 capsule by mouth daily.  Marland Kitchen amiodarone (PACERONE) 200 MG tablet Take 1 tablet (200 mg total) by mouth 2 (two) times daily.  . calcium carbonate (TUMS - DOSED IN MG ELEMENTAL CALCIUM) 500 MG chewable tablet Chew 2 tablets by mouth 2 (two) times daily.  . carboxymethylcellulose (REFRESH PLUS) 0.5 % SOLN Place 1 drop into both eyes 4 (four) times daily.  . cholecalciferol (VITAMIN D) 1000 units tablet Take 2,000 Units by mouth daily.   . cyanocobalamin (,VITAMIN B-12,) 1000 MCG/ML injection Inject 1,000 mcg into the muscle every 14 (fourteen) days.  . cycloSPORINE (RESTASIS) 0.05 % ophthalmic emulsion Place 1 drop into both eyes 2 (two) times daily.   Marland Kitchen ELIQUIS 5 MG TABS tablet Take 5 mg by mouth 2 (two) times daily.  . febuxostat (ULORIC) 40 MG tablet Take 80 mg by mouth daily.  Marland Kitchen gabapentin (NEURONTIN) 100 MG capsule Take 100-200 mg by mouth 2 (two) times daily. Pt takes one capsule in the morning and two at night.  . loperamide (IMODIUM) 2 MG capsule Take 4 mg by mouth 2 (two) times daily.  . magnesium oxide (MAGNESIUM-OXIDE) 400 (241.3 Mg) MG tablet Take 800 mg by mouth 3 (three) times daily.   . Multiple Vitamin (MULTIVITAMIN WITH MINERALS) TABS tablet Take 1 tablet by mouth daily.  Marland Kitchen omeprazole (PRILOSEC) 20 MG capsule Take 20 mg by mouth daily.    . ondansetron (ZOFRAN) 4 MG tablet Take 1 tablet (4 mg total) by mouth every 6 (six) hours.  Marland Kitchen oxyCODONE-acetaminophen (PERCOCET) 5-325 MG tablet Take 1-2 tablets by mouth every 4 (four) hours as needed.  Marland Kitchen PARoxetine (PAXIL) 40 MG tablet Take 20 mg by mouth at bedtime.  Marland Kitchen terazosin (HYTRIN) 2 MG capsule Take 2 mg by mouth at bedtime.   . traZODone (DESYREL) 150 MG  tablet Take 75-150 mg by mouth at bedtime as needed for sleep.   . [DISCONTINUED] potassium phosphate, monobasic, (K-PHOS ORIGINAL) 500 MG tablet Take 1,000 mg by mouth 2 (two) times daily with a meal.    No facility-administered encounter medications on file as of 11/05/2016.     Allergies: Lorazepam and Humira [adalimumab]  Social History   Social History  . Marital status: Married    Spouse name: N/A  . Number of children: 2  . Years of education: N/A   Occupational History  . Retired    Social History Main Topics  . Smoking status: Former Smoker    Packs/day: 2.00    Years: 20.00    Types: Cigarettes    Quit date: 08/04/1975  . Smokeless tobacco: Former Systems developer    Types: [place] date: 08/03/1978     Comment: 09/21/2013 "quit smoking in the late 1970's; stopped chewing couple years after I quit smoking"  . Alcohol use No  . Drug use: No  . Sexual activity: Not Currently   Other Topics Concern  . Not on file   Social History Narrative   Married 2 children and 6 grandchildren all local   [place]   Daily caffeine   Does not exercise regularly    Family  History  Problem Relation Age of Onset  . Kidney disease Father   . Hypertension Father   . Aneurysm Mother   . Aneurysm Sister   . Esophageal cancer Neg Hx   . Stomach cancer Neg Hx   . Rectal cancer Neg Hx     Review of Systems: A 12-system review of systems was performed and was negative except as noted in the HPI.  --------------------------------------------------------------------------------------------------  Physical Exam: BP 120/80 (BP Location: Left Arm, Patient Position: Sitting, Cuff Size: Normal)   Pulse 67   Ht 6' (1.829 m)   Wt 188 lb 1.9 oz (85.3 kg)   SpO2 98%   BMI 25.51 kg/m   General:  Well-developed, well-nourished man, seated comfortably in the exam room. He is accompanied by his wife. HEENT: No conjunctival pallor or scleral icterus.  Moist mucous membranes.  OP  clear. Neck: Supple without lymphadenopathy, thyromegaly, JVD, or HJR.  No carotid bruit. Lungs: Normal work of breathing.  Clear to auscultation bilaterally without wheezes or crackles. Heart: Regular rate and rhythm without murmurs, rubs, or gallops.  Non-displaced PMI. Abd: Bowel sounds present.  Soft, NT/ND. RUQ ostomy present with scant amount of tan output. Pink stoma. Ext: No lower extremity edema.  Radial, PT, and DP pulses are 2+ bilaterally. Skin: warm and dry without rash  EKG:  Normal sinus rhythm with left axis deviation. No significant change from prior tracing on 08/27/16 (I have personally reviewed both tracings).  Lab Results  Component Value Date   WBC 3.7 (L) 10/31/2016   HGB 10.2 (L) 10/31/2016   HCT 32.3 (L) 10/31/2016   MCV 85.7 10/31/2016   PLT 145 (L) 10/31/2016    Lab Results  Component Value Date   NA 138 10/31/2016   K 4.4 10/31/2016   CL 111 10/31/2016   CO2 22 10/31/2016   BUN 9 10/31/2016   CREATININE 1.25 (H) 10/31/2016   GLUCOSE 107 (H) 10/31/2016   ALT 13 (L) 10/31/2016    Lab Results  Component Value Date   CHOL 93 04/19/2015   HDL 31.60 (L) 04/19/2015   LDLDIRECT 28.0 04/19/2015   TRIG 143 09/07/2016   CHOLHDL 3 04/19/2015    --------------------------------------------------------------------------------------------------  ASSESSMENT AND PLAN: Paroxysmal atrial fibrillaition Patient in sinus rhythm today. No symptoms suggest recurrence of a-fib, though he was not symptomatic at the time it was first noted. He is tolerating amiodarone and apixaban well. We have agreed to decrease amiodarone to 200 mg daily. He will continue current dose of apixaban. CHADSVASC is at least 4. We will check a TSH and free T4 today. Recent LFT's were normal. We will also refer the patient for PFTs with DLCO, given chronic amiodarone use. We could consider discontinuation in the future, though he is certainly for recurrence given his valvular heart disease,  cardiomyopathy, and left atrial enlargement.  Non-ischemic cardiomyopathy Most likely related to atrial fibrillation. Nuc stress test in 2015 without ischemia or scar; no symptoms of coronary insufficiency. Patient appears euvolemic and well-compensated today. We will repeat at TTE to ensure normalization of LVEF. If it remains depressed, we will need to consider addition of beta-blocker and ACEI/ARB.  TAA Stable, measuring up to 5.3 cm at sinuses of Valsalva on recent MRA. We will continue with blood pressure monitoring/control. Defer further management and follow-up to Dr. Servando Snare.  Aortic regurgitation Most likely related to TAA. No symptoms of heart failure. No obvious murmur on exam. Will reevaluate with echo, as above. Continue with blood pressure monitoring  and control.  Follow-up: Return to clinic in 3-4 months.  Nelva Bush, MD 11/05/2016 8:59 PM

## 2016-11-06 LAB — PULMONARY FUNCTION TEST
FEF 25-75 Pre: 2.16 L/sec
FEF2575-%Pred-Pre: 91 %
FEV1-%Pred-Pre: 40 %
FEV1-Pre: 1.32 L
FEV1FVC-%PRED-PRE: 133 %
FEV6-%PRED-PRE: 32 %
FEV6-PRE: 1.36 L
FEV6FVC-%Pred-Pre: 106 %
FVC-%PRED-PRE: 30 %
FVC-PRE: 1.36 L
PRE FEV6/FVC RATIO: 100 %
Pre FEV1/FVC ratio: 97 %

## 2016-11-09 DIAGNOSIS — N183 Chronic kidney disease, stage 3 (moderate): Secondary | ICD-10-CM | POA: Diagnosis not present

## 2016-11-09 DIAGNOSIS — Z452 Encounter for adjustment and management of vascular access device: Secondary | ICD-10-CM | POA: Diagnosis not present

## 2016-11-09 DIAGNOSIS — Z48815 Encounter for surgical aftercare following surgery on the digestive system: Secondary | ICD-10-CM | POA: Diagnosis not present

## 2016-11-09 DIAGNOSIS — K5090 Crohn's disease, unspecified, without complications: Secondary | ICD-10-CM | POA: Diagnosis not present

## 2016-11-09 DIAGNOSIS — I129 Hypertensive chronic kidney disease with stage 1 through stage 4 chronic kidney disease, or unspecified chronic kidney disease: Secondary | ICD-10-CM | POA: Diagnosis not present

## 2016-11-09 DIAGNOSIS — Z432 Encounter for attention to ileostomy: Secondary | ICD-10-CM | POA: Diagnosis not present

## 2016-11-10 DIAGNOSIS — I129 Hypertensive chronic kidney disease with stage 1 through stage 4 chronic kidney disease, or unspecified chronic kidney disease: Secondary | ICD-10-CM | POA: Diagnosis not present

## 2016-11-10 DIAGNOSIS — N183 Chronic kidney disease, stage 3 (moderate): Secondary | ICD-10-CM | POA: Diagnosis not present

## 2016-11-10 DIAGNOSIS — J069 Acute upper respiratory infection, unspecified: Secondary | ICD-10-CM | POA: Diagnosis not present

## 2016-11-10 DIAGNOSIS — Z432 Encounter for attention to ileostomy: Secondary | ICD-10-CM | POA: Diagnosis not present

## 2016-11-10 DIAGNOSIS — Z452 Encounter for adjustment and management of vascular access device: Secondary | ICD-10-CM | POA: Diagnosis not present

## 2016-11-10 DIAGNOSIS — Z48815 Encounter for surgical aftercare following surgery on the digestive system: Secondary | ICD-10-CM | POA: Diagnosis not present

## 2016-11-10 DIAGNOSIS — Z6825 Body mass index (BMI) 25.0-25.9, adult: Secondary | ICD-10-CM | POA: Diagnosis not present

## 2016-11-10 DIAGNOSIS — K5090 Crohn's disease, unspecified, without complications: Secondary | ICD-10-CM | POA: Diagnosis not present

## 2016-11-12 DIAGNOSIS — N183 Chronic kidney disease, stage 3 (moderate): Secondary | ICD-10-CM | POA: Diagnosis not present

## 2016-11-14 DIAGNOSIS — I4891 Unspecified atrial fibrillation: Secondary | ICD-10-CM | POA: Diagnosis not present

## 2016-11-14 DIAGNOSIS — F329 Major depressive disorder, single episode, unspecified: Secondary | ICD-10-CM | POA: Diagnosis not present

## 2016-11-14 DIAGNOSIS — F419 Anxiety disorder, unspecified: Secondary | ICD-10-CM | POA: Diagnosis not present

## 2016-11-14 DIAGNOSIS — E538 Deficiency of other specified B group vitamins: Secondary | ICD-10-CM | POA: Diagnosis not present

## 2016-11-14 DIAGNOSIS — I129 Hypertensive chronic kidney disease with stage 1 through stage 4 chronic kidney disease, or unspecified chronic kidney disease: Secondary | ICD-10-CM | POA: Diagnosis not present

## 2016-11-14 DIAGNOSIS — Z96612 Presence of left artificial shoulder joint: Secondary | ICD-10-CM | POA: Diagnosis not present

## 2016-11-14 DIAGNOSIS — G629 Polyneuropathy, unspecified: Secondary | ICD-10-CM | POA: Diagnosis not present

## 2016-11-14 DIAGNOSIS — N183 Chronic kidney disease, stage 3 (moderate): Secondary | ICD-10-CM | POA: Diagnosis not present

## 2016-11-14 DIAGNOSIS — Z7901 Long term (current) use of anticoagulants: Secondary | ICD-10-CM | POA: Diagnosis not present

## 2016-11-14 DIAGNOSIS — Z85828 Personal history of other malignant neoplasm of skin: Secondary | ICD-10-CM | POA: Diagnosis not present

## 2016-11-14 DIAGNOSIS — Z452 Encounter for adjustment and management of vascular access device: Secondary | ICD-10-CM | POA: Diagnosis not present

## 2016-11-14 DIAGNOSIS — F4310 Post-traumatic stress disorder, unspecified: Secondary | ICD-10-CM | POA: Diagnosis not present

## 2016-11-14 DIAGNOSIS — Z432 Encounter for attention to ileostomy: Secondary | ICD-10-CM | POA: Diagnosis not present

## 2016-11-14 DIAGNOSIS — K5090 Crohn's disease, unspecified, without complications: Secondary | ICD-10-CM | POA: Diagnosis not present

## 2016-11-14 DIAGNOSIS — Z87891 Personal history of nicotine dependence: Secondary | ICD-10-CM | POA: Diagnosis not present

## 2016-11-16 DIAGNOSIS — N183 Chronic kidney disease, stage 3 (moderate): Secondary | ICD-10-CM | POA: Diagnosis not present

## 2016-11-16 DIAGNOSIS — Z432 Encounter for attention to ileostomy: Secondary | ICD-10-CM | POA: Diagnosis not present

## 2016-11-16 DIAGNOSIS — Z452 Encounter for adjustment and management of vascular access device: Secondary | ICD-10-CM | POA: Diagnosis not present

## 2016-11-16 DIAGNOSIS — B379 Candidiasis, unspecified: Secondary | ICD-10-CM | POA: Diagnosis not present

## 2016-11-16 DIAGNOSIS — F4310 Post-traumatic stress disorder, unspecified: Secondary | ICD-10-CM | POA: Diagnosis not present

## 2016-11-16 DIAGNOSIS — I129 Hypertensive chronic kidney disease with stage 1 through stage 4 chronic kidney disease, or unspecified chronic kidney disease: Secondary | ICD-10-CM | POA: Diagnosis not present

## 2016-11-16 DIAGNOSIS — K5090 Crohn's disease, unspecified, without complications: Secondary | ICD-10-CM | POA: Diagnosis not present

## 2016-11-17 DIAGNOSIS — Z432 Encounter for attention to ileostomy: Secondary | ICD-10-CM | POA: Diagnosis not present

## 2016-11-17 DIAGNOSIS — N183 Chronic kidney disease, stage 3 (moderate): Secondary | ICD-10-CM | POA: Diagnosis not present

## 2016-11-17 DIAGNOSIS — K5090 Crohn's disease, unspecified, without complications: Secondary | ICD-10-CM | POA: Diagnosis not present

## 2016-11-17 DIAGNOSIS — F4310 Post-traumatic stress disorder, unspecified: Secondary | ICD-10-CM | POA: Diagnosis not present

## 2016-11-17 DIAGNOSIS — I129 Hypertensive chronic kidney disease with stage 1 through stage 4 chronic kidney disease, or unspecified chronic kidney disease: Secondary | ICD-10-CM | POA: Diagnosis not present

## 2016-11-17 DIAGNOSIS — Z452 Encounter for adjustment and management of vascular access device: Secondary | ICD-10-CM | POA: Diagnosis not present

## 2016-11-25 ENCOUNTER — Ambulatory Visit (HOSPITAL_COMMUNITY): Payer: Medicare Other | Attending: Cardiology

## 2016-11-25 ENCOUNTER — Other Ambulatory Visit: Payer: Self-pay

## 2016-11-25 DIAGNOSIS — N183 Chronic kidney disease, stage 3 (moderate): Secondary | ICD-10-CM | POA: Diagnosis not present

## 2016-11-25 DIAGNOSIS — I351 Nonrheumatic aortic (valve) insufficiency: Secondary | ICD-10-CM

## 2016-11-25 DIAGNOSIS — I480 Paroxysmal atrial fibrillation: Secondary | ICD-10-CM | POA: Diagnosis not present

## 2016-11-25 DIAGNOSIS — I129 Hypertensive chronic kidney disease with stage 1 through stage 4 chronic kidney disease, or unspecified chronic kidney disease: Secondary | ICD-10-CM | POA: Diagnosis not present

## 2016-11-25 DIAGNOSIS — Z452 Encounter for adjustment and management of vascular access device: Secondary | ICD-10-CM | POA: Diagnosis not present

## 2016-11-25 DIAGNOSIS — F4310 Post-traumatic stress disorder, unspecified: Secondary | ICD-10-CM | POA: Diagnosis not present

## 2016-11-25 DIAGNOSIS — Z432 Encounter for attention to ileostomy: Secondary | ICD-10-CM | POA: Diagnosis not present

## 2016-11-25 DIAGNOSIS — K5090 Crohn's disease, unspecified, without complications: Secondary | ICD-10-CM | POA: Diagnosis not present

## 2016-11-25 DIAGNOSIS — I428 Other cardiomyopathies: Secondary | ICD-10-CM | POA: Insufficient documentation

## 2016-11-30 DIAGNOSIS — N183 Chronic kidney disease, stage 3 (moderate): Secondary | ICD-10-CM | POA: Diagnosis not present

## 2016-11-30 DIAGNOSIS — K5090 Crohn's disease, unspecified, without complications: Secondary | ICD-10-CM | POA: Diagnosis not present

## 2016-11-30 DIAGNOSIS — Z452 Encounter for adjustment and management of vascular access device: Secondary | ICD-10-CM | POA: Diagnosis not present

## 2016-11-30 DIAGNOSIS — F4310 Post-traumatic stress disorder, unspecified: Secondary | ICD-10-CM | POA: Diagnosis not present

## 2016-11-30 DIAGNOSIS — Z432 Encounter for attention to ileostomy: Secondary | ICD-10-CM | POA: Diagnosis not present

## 2016-11-30 DIAGNOSIS — I129 Hypertensive chronic kidney disease with stage 1 through stage 4 chronic kidney disease, or unspecified chronic kidney disease: Secondary | ICD-10-CM | POA: Diagnosis not present

## 2016-12-07 ENCOUNTER — Other Ambulatory Visit (HOSPITAL_COMMUNITY)
Admission: AD | Admit: 2016-12-07 | Discharge: 2016-12-07 | Disposition: A | Discharge status: Home / Self Care | Payer: Medicare Other | Source: Skilled Nursing Facility | Attending: Surgery | Admitting: Surgery

## 2016-12-07 DIAGNOSIS — F4310 Post-traumatic stress disorder, unspecified: Secondary | ICD-10-CM | POA: Diagnosis not present

## 2016-12-07 DIAGNOSIS — Z452 Encounter for adjustment and management of vascular access device: Secondary | ICD-10-CM | POA: Insufficient documentation

## 2016-12-07 DIAGNOSIS — Z432 Encounter for attention to ileostomy: Secondary | ICD-10-CM | POA: Diagnosis not present

## 2016-12-07 DIAGNOSIS — N183 Chronic kidney disease, stage 3 (moderate): Secondary | ICD-10-CM | POA: Diagnosis not present

## 2016-12-07 DIAGNOSIS — K5090 Crohn's disease, unspecified, without complications: Secondary | ICD-10-CM | POA: Diagnosis not present

## 2016-12-07 DIAGNOSIS — I129 Hypertensive chronic kidney disease with stage 1 through stage 4 chronic kidney disease, or unspecified chronic kidney disease: Secondary | ICD-10-CM | POA: Diagnosis not present

## 2016-12-07 LAB — BASIC METABOLIC PANEL
ANION GAP: 8 (ref 5–15)
BUN: 12 mg/dL (ref 6–20)
CALCIUM: 8.2 mg/dL — AB (ref 8.9–10.3)
CO2: 20 mmol/L — ABNORMAL LOW (ref 22–32)
Chloride: 113 mmol/L — ABNORMAL HIGH (ref 101–111)
Creatinine, Ser: 1.43 mg/dL — ABNORMAL HIGH (ref 0.61–1.24)
GFR, EST AFRICAN AMERICAN: 54 mL/min — AB (ref >60)
GFR, EST NON AFRICAN AMERICAN: 47 mL/min — AB (ref >60)
Glucose, Bld: 88 mg/dL (ref 65–99)
POTASSIUM: 4.6 mmol/L (ref 3.5–5.1)
SODIUM: 141 mmol/L (ref 135–145)

## 2016-12-14 DIAGNOSIS — Z452 Encounter for adjustment and management of vascular access device: Secondary | ICD-10-CM | POA: Diagnosis not present

## 2016-12-14 DIAGNOSIS — N183 Chronic kidney disease, stage 3 (moderate): Secondary | ICD-10-CM | POA: Diagnosis not present

## 2016-12-14 DIAGNOSIS — K5090 Crohn's disease, unspecified, without complications: Secondary | ICD-10-CM | POA: Diagnosis not present

## 2016-12-14 DIAGNOSIS — F4310 Post-traumatic stress disorder, unspecified: Secondary | ICD-10-CM | POA: Diagnosis not present

## 2016-12-14 DIAGNOSIS — Z432 Encounter for attention to ileostomy: Secondary | ICD-10-CM | POA: Diagnosis not present

## 2016-12-14 DIAGNOSIS — I129 Hypertensive chronic kidney disease with stage 1 through stage 4 chronic kidney disease, or unspecified chronic kidney disease: Secondary | ICD-10-CM | POA: Diagnosis not present

## 2016-12-21 DIAGNOSIS — N200 Calculus of kidney: Secondary | ICD-10-CM | POA: Diagnosis not present

## 2016-12-21 DIAGNOSIS — I129 Hypertensive chronic kidney disease with stage 1 through stage 4 chronic kidney disease, or unspecified chronic kidney disease: Secondary | ICD-10-CM | POA: Diagnosis not present

## 2016-12-21 DIAGNOSIS — R69 Illness, unspecified: Secondary | ICD-10-CM | POA: Diagnosis not present

## 2016-12-21 DIAGNOSIS — C189 Malignant neoplasm of colon, unspecified: Secondary | ICD-10-CM | POA: Diagnosis not present

## 2016-12-21 DIAGNOSIS — K635 Polyp of colon: Secondary | ICD-10-CM | POA: Diagnosis not present

## 2016-12-21 DIAGNOSIS — M109 Gout, unspecified: Secondary | ICD-10-CM | POA: Diagnosis not present

## 2016-12-21 DIAGNOSIS — F4310 Post-traumatic stress disorder, unspecified: Secondary | ICD-10-CM | POA: Diagnosis not present

## 2016-12-21 DIAGNOSIS — E538 Deficiency of other specified B group vitamins: Secondary | ICD-10-CM | POA: Diagnosis not present

## 2016-12-21 DIAGNOSIS — Z86711 Personal history of pulmonary embolism: Secondary | ICD-10-CM | POA: Diagnosis not present

## 2016-12-21 DIAGNOSIS — K5090 Crohn's disease, unspecified, without complications: Secondary | ICD-10-CM | POA: Diagnosis not present

## 2016-12-21 DIAGNOSIS — E669 Obesity, unspecified: Secondary | ICD-10-CM | POA: Diagnosis not present

## 2016-12-21 DIAGNOSIS — I4891 Unspecified atrial fibrillation: Secondary | ICD-10-CM | POA: Diagnosis not present

## 2016-12-21 DIAGNOSIS — D631 Anemia in chronic kidney disease: Secondary | ICD-10-CM | POA: Diagnosis not present

## 2016-12-21 DIAGNOSIS — N183 Chronic kidney disease, stage 3 (moderate): Secondary | ICD-10-CM | POA: Diagnosis not present

## 2016-12-21 DIAGNOSIS — N2581 Secondary hyperparathyroidism of renal origin: Secondary | ICD-10-CM | POA: Diagnosis not present

## 2016-12-21 DIAGNOSIS — Z452 Encounter for adjustment and management of vascular access device: Secondary | ICD-10-CM | POA: Diagnosis not present

## 2016-12-21 DIAGNOSIS — Z432 Encounter for attention to ileostomy: Secondary | ICD-10-CM | POA: Diagnosis not present

## 2016-12-29 DIAGNOSIS — N183 Chronic kidney disease, stage 3 (moderate): Secondary | ICD-10-CM | POA: Diagnosis not present

## 2016-12-29 DIAGNOSIS — F4310 Post-traumatic stress disorder, unspecified: Secondary | ICD-10-CM | POA: Diagnosis not present

## 2016-12-29 DIAGNOSIS — Z432 Encounter for attention to ileostomy: Secondary | ICD-10-CM | POA: Diagnosis not present

## 2016-12-29 DIAGNOSIS — I129 Hypertensive chronic kidney disease with stage 1 through stage 4 chronic kidney disease, or unspecified chronic kidney disease: Secondary | ICD-10-CM | POA: Diagnosis not present

## 2016-12-29 DIAGNOSIS — Z452 Encounter for adjustment and management of vascular access device: Secondary | ICD-10-CM | POA: Diagnosis not present

## 2016-12-29 DIAGNOSIS — K5090 Crohn's disease, unspecified, without complications: Secondary | ICD-10-CM | POA: Diagnosis not present

## 2017-01-04 DIAGNOSIS — Z432 Encounter for attention to ileostomy: Secondary | ICD-10-CM | POA: Diagnosis not present

## 2017-01-04 DIAGNOSIS — F4310 Post-traumatic stress disorder, unspecified: Secondary | ICD-10-CM | POA: Diagnosis not present

## 2017-01-04 DIAGNOSIS — Z452 Encounter for adjustment and management of vascular access device: Secondary | ICD-10-CM | POA: Diagnosis not present

## 2017-01-04 DIAGNOSIS — K5090 Crohn's disease, unspecified, without complications: Secondary | ICD-10-CM | POA: Diagnosis not present

## 2017-01-04 DIAGNOSIS — I129 Hypertensive chronic kidney disease with stage 1 through stage 4 chronic kidney disease, or unspecified chronic kidney disease: Secondary | ICD-10-CM | POA: Diagnosis not present

## 2017-01-04 DIAGNOSIS — B379 Candidiasis, unspecified: Secondary | ICD-10-CM | POA: Diagnosis not present

## 2017-01-04 DIAGNOSIS — N183 Chronic kidney disease, stage 3 (moderate): Secondary | ICD-10-CM | POA: Diagnosis not present

## 2017-01-05 ENCOUNTER — Other Ambulatory Visit: Payer: Self-pay | Admitting: Internal Medicine

## 2017-01-05 ENCOUNTER — Ambulatory Visit (INDEPENDENT_AMBULATORY_CARE_PROVIDER_SITE_OTHER): Payer: Medicare Other | Admitting: Internal Medicine

## 2017-01-05 DIAGNOSIS — R0600 Dyspnea, unspecified: Secondary | ICD-10-CM

## 2017-01-05 LAB — PULMONARY FUNCTION TEST
DL/VA % PRED: 85 %
DL/VA: 3.98 ml/min/mmHg/L
DLCO UNC % PRED: 60 %
DLCO UNC: 20.3 ml/min/mmHg
DLCO cor % pred: 63 %
DLCO cor: 21.26 ml/min/mmHg
FEF 25-75 PRE: 3.22 L/s
FEF 25-75 Post: 4.18 L/sec
FEF2575-%Change-Post: 29 %
FEF2575-%Pred-Post: 177 %
FEF2575-%Pred-Pre: 136 %
FEV1-%CHANGE-POST: 5 %
FEV1-%PRED-POST: 97 %
FEV1-%PRED-PRE: 92 %
FEV1-Post: 3.14 L
FEV1-Pre: 2.99 L
FEV1FVC-%Change-Post: -1 %
FEV1FVC-%Pred-Pre: 112 %
FEV6-%CHANGE-POST: 8 %
FEV6-%PRED-POST: 92 %
FEV6-%PRED-PRE: 85 %
FEV6-PRE: 3.56 L
FEV6-Post: 3.86 L
FEV6FVC-%CHANGE-POST: 0 %
FEV6FVC-%PRED-PRE: 104 %
FEV6FVC-%Pred-Post: 105 %
FVC-%CHANGE-POST: 6 %
FVC-%Pred-Post: 87 %
FVC-%Pred-Pre: 82 %
FVC-Post: 3.89 L
FVC-Pre: 3.64 L
POST FEV6/FVC RATIO: 99 %
PRE FEV6/FVC RATIO: 99 %
Post FEV1/FVC ratio: 81 %
Pre FEV1/FVC ratio: 82 %
RV % PRED: 52 %
RV: 1.36 L
TLC % pred: 69 %
TLC: 5.05 L

## 2017-01-05 NOTE — Progress Notes (Signed)
PFT done today. 

## 2017-01-08 ENCOUNTER — Telehealth: Payer: Self-pay | Admitting: *Deleted

## 2017-01-08 DIAGNOSIS — R942 Abnormal results of pulmonary function studies: Secondary | ICD-10-CM

## 2017-01-08 NOTE — Telephone Encounter (Signed)
Notes recorded by Nelva Bush, MD on 01/06/2017 at 8:02 AM EDT Please let Derek [name] know that his pulmonary function tests indicate possible scarring (fibrosis) of the lungs. While this could be due to a number a factors, we will need to keep a close eye on this given ongoing amiodarone use. I recommend referral to a pulmonologist for further assessment and recommendations regarding continued amio use. Thanks.

## 2017-01-11 DIAGNOSIS — Z432 Encounter for attention to ileostomy: Secondary | ICD-10-CM | POA: Diagnosis not present

## 2017-01-11 DIAGNOSIS — K5090 Crohn's disease, unspecified, without complications: Secondary | ICD-10-CM | POA: Diagnosis not present

## 2017-01-11 DIAGNOSIS — Z452 Encounter for adjustment and management of vascular access device: Secondary | ICD-10-CM | POA: Diagnosis not present

## 2017-01-11 DIAGNOSIS — I129 Hypertensive chronic kidney disease with stage 1 through stage 4 chronic kidney disease, or unspecified chronic kidney disease: Secondary | ICD-10-CM | POA: Diagnosis not present

## 2017-01-11 DIAGNOSIS — F4310 Post-traumatic stress disorder, unspecified: Secondary | ICD-10-CM | POA: Diagnosis not present

## 2017-01-11 DIAGNOSIS — N183 Chronic kidney disease, stage 3 (moderate): Secondary | ICD-10-CM | POA: Diagnosis not present

## 2017-01-13 DIAGNOSIS — N183 Chronic kidney disease, stage 3 (moderate): Secondary | ICD-10-CM | POA: Diagnosis not present

## 2017-01-13 DIAGNOSIS — Z87891 Personal history of nicotine dependence: Secondary | ICD-10-CM | POA: Diagnosis not present

## 2017-01-13 DIAGNOSIS — I4891 Unspecified atrial fibrillation: Secondary | ICD-10-CM | POA: Diagnosis not present

## 2017-01-13 DIAGNOSIS — E538 Deficiency of other specified B group vitamins: Secondary | ICD-10-CM | POA: Diagnosis not present

## 2017-01-13 DIAGNOSIS — Z432 Encounter for attention to ileostomy: Secondary | ICD-10-CM | POA: Diagnosis not present

## 2017-01-13 DIAGNOSIS — F419 Anxiety disorder, unspecified: Secondary | ICD-10-CM | POA: Diagnosis not present

## 2017-01-13 DIAGNOSIS — F4310 Post-traumatic stress disorder, unspecified: Secondary | ICD-10-CM | POA: Diagnosis not present

## 2017-01-13 DIAGNOSIS — Z96612 Presence of left artificial shoulder joint: Secondary | ICD-10-CM | POA: Diagnosis not present

## 2017-01-13 DIAGNOSIS — G629 Polyneuropathy, unspecified: Secondary | ICD-10-CM | POA: Diagnosis not present

## 2017-01-13 DIAGNOSIS — Z85828 Personal history of other malignant neoplasm of skin: Secondary | ICD-10-CM | POA: Diagnosis not present

## 2017-01-13 DIAGNOSIS — I129 Hypertensive chronic kidney disease with stage 1 through stage 4 chronic kidney disease, or unspecified chronic kidney disease: Secondary | ICD-10-CM | POA: Diagnosis not present

## 2017-01-13 DIAGNOSIS — K5090 Crohn's disease, unspecified, without complications: Secondary | ICD-10-CM | POA: Diagnosis not present

## 2017-01-13 DIAGNOSIS — Z7901 Long term (current) use of anticoagulants: Secondary | ICD-10-CM | POA: Diagnosis not present

## 2017-01-13 DIAGNOSIS — Z452 Encounter for adjustment and management of vascular access device: Secondary | ICD-10-CM | POA: Diagnosis not present

## 2017-01-13 DIAGNOSIS — F329 Major depressive disorder, single episode, unspecified: Secondary | ICD-10-CM | POA: Diagnosis not present

## 2017-01-19 DIAGNOSIS — I129 Hypertensive chronic kidney disease with stage 1 through stage 4 chronic kidney disease, or unspecified chronic kidney disease: Secondary | ICD-10-CM | POA: Diagnosis not present

## 2017-01-19 DIAGNOSIS — E8841 MELAS syndrome: Secondary | ICD-10-CM | POA: Diagnosis not present

## 2017-01-19 DIAGNOSIS — Z432 Encounter for attention to ileostomy: Secondary | ICD-10-CM | POA: Diagnosis not present

## 2017-01-19 DIAGNOSIS — K5090 Crohn's disease, unspecified, without complications: Secondary | ICD-10-CM | POA: Diagnosis not present

## 2017-01-19 DIAGNOSIS — Z452 Encounter for adjustment and management of vascular access device: Secondary | ICD-10-CM | POA: Diagnosis not present

## 2017-01-19 DIAGNOSIS — N183 Chronic kidney disease, stage 3 (moderate): Secondary | ICD-10-CM | POA: Diagnosis not present

## 2017-01-19 DIAGNOSIS — F4310 Post-traumatic stress disorder, unspecified: Secondary | ICD-10-CM | POA: Diagnosis not present

## 2017-01-20 ENCOUNTER — Encounter: Payer: Self-pay | Admitting: Internal Medicine

## 2017-01-20 ENCOUNTER — Ambulatory Visit (INDEPENDENT_AMBULATORY_CARE_PROVIDER_SITE_OTHER): Payer: Medicare Other | Admitting: Internal Medicine

## 2017-01-20 VITALS — BP 120/72 | HR 85 | Ht 72.0 in | Wt 201.2 lb

## 2017-01-20 DIAGNOSIS — K5090 Crohn's disease, unspecified, without complications: Secondary | ICD-10-CM | POA: Diagnosis not present

## 2017-01-20 DIAGNOSIS — Z79899 Other long term (current) drug therapy: Secondary | ICD-10-CM | POA: Diagnosis not present

## 2017-01-20 DIAGNOSIS — Z452 Encounter for adjustment and management of vascular access device: Secondary | ICD-10-CM | POA: Diagnosis not present

## 2017-01-20 DIAGNOSIS — N183 Chronic kidney disease, stage 3 (moderate): Secondary | ICD-10-CM | POA: Diagnosis not present

## 2017-01-20 DIAGNOSIS — R942 Abnormal results of pulmonary function studies: Secondary | ICD-10-CM

## 2017-01-20 DIAGNOSIS — J9811 Atelectasis: Secondary | ICD-10-CM | POA: Diagnosis not present

## 2017-01-20 DIAGNOSIS — Z432 Encounter for attention to ileostomy: Secondary | ICD-10-CM | POA: Diagnosis not present

## 2017-01-20 DIAGNOSIS — I129 Hypertensive chronic kidney disease with stage 1 through stage 4 chronic kidney disease, or unspecified chronic kidney disease: Secondary | ICD-10-CM | POA: Diagnosis not present

## 2017-01-20 DIAGNOSIS — F4310 Post-traumatic stress disorder, unspecified: Secondary | ICD-10-CM | POA: Diagnosis not present

## 2017-01-20 NOTE — Patient Instructions (Signed)
ICD-10-CM   1. On amiodarone therapy Z79.899   2. Abnormal PFT R94.2   3. Bilateral atelectasis J98.11     You have scarring in lung base that looks like "atelectasis". This likely is from old penumonia 20 years ago It does not look like another type of lung disease called fibrosis Regarless it is mildly affecting your lung function with oxygen transfer but is very mild and you do not feel it Regardless I do not think is related to amiodarone  Plan - ideall you need HRCT chest (to sort out reason for CT findings) but respect your desire to refuse - please get CT chest results from [facility] (sign release)  - ok to continue amiodarone for now - will follow with symptoms and breathing test for now based on our discussion - in 6 months do Pre-bd spiro and dlco only. No lung volume or bd response.   Followup 6 months or sooner if needed

## 2017-01-20 NOTE — Progress Notes (Signed)
Subjective:    Patient ID: Derek [name], male    DOB: 06-Oct-1940, 76 y.o.   MRN: [id number]    PCP Sharilyn Sites, MD  HPI   [name] 01/20/2017  Chief Complaint  Patient presents with  . abnormal PFT    referring doctor is Dr. Harrell Gave End the patient had an abnormal PFT test    76 year old male with multiple medical problems. He has been on amiodarone for a few years.  Then on 01/05/2017 he had pulmonary function test that showed isolated reduction in diffusion capacity to 60% [I personally visualized the graph  Tracing and agree) . In terms of his recent history he is on amiodarone for paroxysmal atrial fibrillation. He has also remote PE history. He is on Xarelto. In October 2017 is supposed to have a scheduled colectomy but he was found to be on atrial fibrillation and he was cardioverted. April 2018 cardiology visit showed he was in sinus rhythm and his many year "2 amio was reduced to 1 per wife". He does have mildly reduced ejection fraction. In the interim to be in October 2017 in April 2018 the underwent transverse colectomy by Dr. [name]. Course was complicated by breakdown and anastomosis and in March of February 2018 he underwent laparotomy with right colectomy and end ileostomy. Then in April 2018 of March 2018 he had influenza and was hospitalized according to his history. He has since recovered from it. At baseline he is not experiencing any chest pain or shortness of breath or palpitations or cough or wheezing except very rarely. Feels good overall. He does have a PICC line because of his colectomy for he gets hydration.   He also tells me that at baseline he is noticed to have chronic scarring in his lungs from pneumonia suffered 20 years ago. Review of the the radiology images shows that even as early as 2013 he had some bilateral lower lobe atelectasis seen on CT chest. This is seen on CT abdomen lung cut even as of January 2018 and October 2017. In March 2089 on the time he  had a surgery/influenza this is more prominent with some lower lobe consolidation. This no further imaging. According to have a CT chest at the [facility] recently for these images are hard to get.  Walking desaturation test 185 feet 3 laps on room air in the office: did not drop below 97%. Walked fast.   He has refused to have HRCT this visit due to multiple scans this year including a suspected CT chest [facility] this year   has a past medical history of Anemia; Anxiety; Aortic insufficiency; Arthritis; Ascending aortic aneurysm ([place]); Atrial fibrillation ([place]); B12 deficiency; Cataract; CKD (chronic kidney disease) stage 3, GFR 30-59 ml/min (08/22/2011); Clotting disorder ([place]); Crohn's disease ([place]); Depression; Enlarged prostate; Enteric hyperoxaluria ([place]) (02/21/2016); GERD (gastroesophageal reflux disease); Gout; Heart murmur; Hepatitis C (1978); Hiatal hernia; History of blood transfusion (1978; 1990's; ?); History of colon polyps; History of kidney stones; History of MRSA infection (2010); History of pulmonary embolism (2006); History of small bowel obstruction; History of staph infection (1978); Hyperoxaluria ([place]); Hypertension; Insomnia; Internal hemorrhoids; LV dysfunction; Nephrolithiasis; Pancreatitis (2010); Pancytopenia; Peripheral neuropathy; Pneumonia; Post-traumatic stress syndrome; PTSD (post-traumatic stress disorder); Pulmonary nodule; RLS (restless legs syndrome); Rosacea conjunctivitis(372.31); Secondary hyperparathyroidism ([place]) (02/21/2016); Sinus bradycardia; Skin cancer; Small bowel obstruction ([place]); and Thrombocytopenia ([place]).   reports that he quit smoking about 41 years ago. His smoking use included Cigarettes. He has a 40.00 pack-year smoking  history. He quit smokeless tobacco use about 38 years ago. His smokeless tobacco use included Chew.  Past Surgical History:  Procedure Laterality Date  . ANKLE SURGERY Right   . APPENDECTOMY  1978  . BOWEL RESECTION  1978 X 2    . CARDIOVERSION N/A 05/29/2016   Procedure: CARDIOVERSION;  Surgeon: Sanda Klein, MD;  Location: [facility];  Service: Cardiovascular;  Laterality: N/A;  . CHOLECYSTECTOMY    . COLON RESECTION N/A 08/14/2016   Procedure: LAPAROSCOPIC RESECTION TRANSVERSE COLON;  Surgeon: [name], MD;  Location: [facility];  Service: General;  Laterality: N/A;  . COLON SURGERY    . COLONOSCOPY    . ESOPHAGOGASTRODUODENOSCOPY    . EYE SURGERY     cataract surgery bilateral  . FOOT SURGERY Right    "took gout out"  . HEMICOLECTOMY Right   . ILEOCECETOMY  1978   [name] 05/10/2000  (09/21/2013)  . ILEOSTOMY    . INGUINAL HERNIA REPAIR Right   . KNEE ARTHROSCOPY Left   . LAPAROTOMY N/A 08/27/2016   Procedure: EXPLORATORYLAPAROTOMY, LYSIS OF ADHESIONS, ILEOSTOMY, RIGHT COLECTOMY;  Surgeon: [name], MD;  Location: [facility];  Service: General;  Laterality: N/A;  . LIGAMENT REPAIR Left   . TEE WITHOUT CARDIOVERSION N/A 05/29/2016   Procedure: TRANSESOPHAGEAL ECHOCARDIOGRAM (TEE);  Surgeon: Sanda Klein, MD;  Location: [facility];  Service: Cardiovascular;  Laterality: N/A;  . TOTAL SHOULDER ARTHROPLASTY Left 09/21/2013  . TOTAL SHOULDER ARTHROPLASTY Left 09/21/2013   Procedure: LEFT TOTAL SHOULDER ARTHROPLASTY;  Surgeon: Marin Shutter, MD;  Location: [facility];  Service: Orthopedics;  Laterality: Left;    Allergies  Allergen Reactions  . Lorazepam Other (See Comments)    Reaction:  Hallucinations   . Humira [Adalimumab] Other (See Comments)    Pt states that he got pancreatitis.      Immunization History  Administered Date(s) Administered  . Influenza-Unspecified 06/22/2012, 06/03/2013, 06/04/2015  . Pneumococcal Conjugate-13 02/21/2016  . Pneumococcal-Unspecified 07/04/2010  . Td 01/01/2010    Family History  Problem Relation Age of Onset  . Kidney disease Father   . Hypertension Father   . Aneurysm Mother   . Aneurysm Sister   . Esophageal cancer Neg Hx   . Stomach cancer Neg Hx   .  Rectal cancer Neg Hx      Current Outpatient Prescriptions:  .  Acidophilus Lactobacillus CAPS, Take 1 capsule by mouth daily., Disp: 90 capsule, Rfl: 3 .  amiodarone (PACERONE) 200 MG tablet, Take 1 tablet (200 mg total) by mouth daily., Disp: 90 tablet, Rfl: 3 .  calcium carbonate (TUMS - DOSED IN MG ELEMENTAL CALCIUM) 500 MG chewable tablet, Chew 2 tablets by mouth 2 (two) times daily., Disp: , Rfl:  .  carboxymethylcellulose (REFRESH PLUS) 0.5 % SOLN, Place 1 drop into both eyes 4 (four) times daily., Disp: , Rfl:  .  cholecalciferol (VITAMIN D) 1000 units tablet, Take 2,000 Units by mouth daily. , Disp: , Rfl:  .  cyanocobalamin (,VITAMIN B-12,) 1000 MCG/ML injection, Inject 1,000 mcg into the muscle every 14 (fourteen) days., Disp: , Rfl:  .  cycloSPORINE (RESTASIS) 0.05 % ophthalmic emulsion, Place 1 drop into both eyes 2 (two) times daily. , Disp: , Rfl:  .  ELIQUIS 5 MG TABS tablet, Take 1 tablet (5 mg total) by mouth 2 (two) times daily., Disp: 180 tablet, Rfl: 3 .  febuxostat (ULORIC) 40 MG tablet, Take 80 mg by mouth daily., Disp: , Rfl:  .  gabapentin (NEURONTIN) 100  MG capsule, Take 100-200 mg by mouth 2 (two) times daily. Pt takes one capsule in the morning and two at night., Disp: , Rfl:  .  loperamide (IMODIUM) 2 MG capsule, Take 4 mg by mouth 2 (two) times daily., Disp: , Rfl:  .  magnesium oxide (MAGNESIUM-OXIDE) 400 (241.3 Mg) MG tablet, Take 800 mg by mouth 3 (three) times daily. , Disp: , Rfl:  .  Multiple Vitamin (MULTIVITAMIN WITH MINERALS) TABS tablet, Take 1 tablet by mouth daily., Disp: , Rfl:  .  omeprazole (PRILOSEC) 20 MG capsule, Take 20 mg by mouth daily.  , Disp: , Rfl:  .  ondansetron (ZOFRAN) 4 MG tablet, Take 1 tablet (4 mg total) by mouth every 6 (six) hours., Disp: 12 tablet, Rfl: 0 .  oxyCODONE-acetaminophen (PERCOCET) 5-325 MG tablet, Take 1-2 tablets by mouth every 4 (four) hours as needed., Disp: 20 tablet, Rfl: 0 .  PARoxetine (PAXIL) 40 MG tablet, Take  20 mg by mouth at bedtime., Disp: , Rfl:  .  terazosin (HYTRIN) 2 MG capsule, Take 2 mg by mouth at bedtime. , Disp: , Rfl:  .  traZODone (DESYREL) 150 MG tablet, Take 75-150 mg by mouth at bedtime as needed for sleep. , Disp: , Rfl:     Review of Systems  Constitutional: Negative for fever and unexpected weight change.  HENT: Positive for dental problem and rhinorrhea. Negative for congestion, ear pain, nosebleeds, postnasal drip, sinus pressure, sneezing and sore throat.   Eyes: Positive for redness and itching.  Respiratory: Positive for shortness of breath and wheezing. Negative for cough and chest tightness.   Cardiovascular: Negative for palpitations and leg swelling.  Gastrointestinal: Negative for nausea and vomiting.  Genitourinary: Negative for dysuria.  Musculoskeletal: Negative for joint swelling.  Skin: Negative for rash.  Allergic/Immunologic: Negative.  Negative for environmental allergies, food allergies and immunocompromised state.  Neurological: Positive for headaches.  Hematological: Bruises/bleeds easily.  Psychiatric/Behavioral: Negative for dysphoric mood. The patient is nervous/anxious.        Objective:   Physical Exam  Constitutional: He is oriented to person, place, and time. He appears well-developed and well-nourished. No distress.  HENT:  Head: Normocephalic and atraumatic.  Right Ear: External ear normal.  Left Ear: External ear normal.  Mouth/Throat: Oropharynx is clear and moist. No oropharyngeal exudate.  Eyes: Conjunctivae and EOM are normal. Pupils are equal, round, and reactive to light. Right eye exhibits no discharge. Left eye exhibits no discharge. No scleral icterus.  Neck: Normal range of motion. Neck supple. No JVD present. No tracheal deviation present. No thyromegaly present.  Cardiovascular: Normal rate, regular rhythm and intact distal pulses.  Exam reveals no gallop and no friction rub.   No murmur heard. Pulmonary/Chest: Effort  normal. No respiratory distress. He has no wheezes. He has rales. He exhibits no tenderness.  R > L crackles at base  Abdominal: Soft. Bowel sounds are normal. He exhibits no distension and no mass. There is no tenderness. There is no rebound and no guarding.  Musculoskeletal: Normal range of motion. He exhibits no edema or tenderness.  Lymphadenopathy:    He has no cervical adenopathy.  Neurological: He is alert and oriented to person, place, and time. He has normal reflexes. No cranial nerve deficit. Coordination normal.  Skin: Skin is warm and dry. No rash noted. He is not diaphoretic. No erythema. No pallor.  Psychiatric: He has a normal mood and affect. His behavior is normal. Judgment and thought content normal.  Nursing  note and vitals reviewed.   Vitals:   01/20/17 0956  BP: 120/72  Pulse: 85  SpO2: 94%  Weight: 201 lb 3.2 oz (91.3 kg)  Height: 6' (1.829 m)    Estimated body mass index is 27.29 kg/m as calculated from the following:   Height as of this encounter: 6' (1.829 m).   Weight as of this encounter: 201 lb 3.2 oz (91.3 kg).         Assessment & Plan:     ICD-10-CM   1. On amiodarone therapy Z79.899   2. Abnormal PFT R94.2   3. Bilateral atelectasis J98.11      You have scarring in lung base that looks like "atelectasis". This likely is from old penumonia 20 years ago It does not look like another type of lung disease called fibrosis Regarless it is mildly affecting your lung function with oxygen transfer but is very mild and you do not feel it Regardless I do not think is related to amiodarone  Plan - ideall you need HRCT chest (to sort out reason for CT findings) but respect your desire to refuse - please get CT chest results from [facility] (sign release)  - ok to continue amiodarone for now - will follow with symptoms and breathing test for now based on our discussion - in 6 months do Pre-bd spiro and dlco only. No lung volume or bd response.    Followup 6 months or sooner if needed   Dr. Brand Males, M.D., [facility].C.P Pulmonary and Critical Care Medicine Staff Physician [place] [facility] Pager: [phone], If no answer or between  15:00h - 7:00h: call [phone]  01/20/2017 10:36 AM

## 2017-01-20 NOTE — Addendum Note (Signed)
Addended by: Collier Salina on: 01/20/2017 10:42 AM   Modules accepted: Orders

## 2017-01-20 NOTE — Addendum Note (Signed)
Addended by: Collier Salina on: 01/20/2017 10:43 AM   Modules accepted: Orders

## 2017-01-26 DIAGNOSIS — N183 Chronic kidney disease, stage 3 (moderate): Secondary | ICD-10-CM | POA: Diagnosis not present

## 2017-01-26 DIAGNOSIS — Z432 Encounter for attention to ileostomy: Secondary | ICD-10-CM | POA: Diagnosis not present

## 2017-01-26 DIAGNOSIS — I129 Hypertensive chronic kidney disease with stage 1 through stage 4 chronic kidney disease, or unspecified chronic kidney disease: Secondary | ICD-10-CM | POA: Diagnosis not present

## 2017-01-26 DIAGNOSIS — Z452 Encounter for adjustment and management of vascular access device: Secondary | ICD-10-CM | POA: Diagnosis not present

## 2017-01-26 DIAGNOSIS — F4310 Post-traumatic stress disorder, unspecified: Secondary | ICD-10-CM | POA: Diagnosis not present

## 2017-01-26 DIAGNOSIS — K5090 Crohn's disease, unspecified, without complications: Secondary | ICD-10-CM | POA: Diagnosis not present

## 2017-01-28 ENCOUNTER — Emergency Department (HOSPITAL_COMMUNITY): Payer: Medicare Other

## 2017-01-28 ENCOUNTER — Encounter (HOSPITAL_COMMUNITY): Payer: Self-pay | Admitting: *Deleted

## 2017-01-28 ENCOUNTER — Telehealth: Payer: Self-pay | Admitting: Internal Medicine

## 2017-01-28 ENCOUNTER — Emergency Department (HOSPITAL_COMMUNITY)
Admission: EM | Admit: 2017-01-28 | Discharge: 2017-01-28 | Disposition: A | Discharge status: Home / Self Care | Payer: Medicare Other | Attending: Emergency Medicine | Admitting: Emergency Medicine

## 2017-01-28 DIAGNOSIS — Z85038 Personal history of other malignant neoplasm of large intestine: Secondary | ICD-10-CM | POA: Diagnosis not present

## 2017-01-28 DIAGNOSIS — I129 Hypertensive chronic kidney disease with stage 1 through stage 4 chronic kidney disease, or unspecified chronic kidney disease: Secondary | ICD-10-CM | POA: Diagnosis not present

## 2017-01-28 DIAGNOSIS — Z87891 Personal history of nicotine dependence: Secondary | ICD-10-CM | POA: Diagnosis not present

## 2017-01-28 DIAGNOSIS — N183 Chronic kidney disease, stage 3 (moderate): Secondary | ICD-10-CM | POA: Diagnosis not present

## 2017-01-28 DIAGNOSIS — Z79899 Other long term (current) drug therapy: Secondary | ICD-10-CM | POA: Diagnosis not present

## 2017-01-28 DIAGNOSIS — R079 Chest pain, unspecified: Secondary | ICD-10-CM

## 2017-01-28 DIAGNOSIS — Z85828 Personal history of other malignant neoplasm of skin: Secondary | ICD-10-CM | POA: Insufficient documentation

## 2017-01-28 DIAGNOSIS — R161 Splenomegaly, not elsewhere classified: Secondary | ICD-10-CM | POA: Diagnosis not present

## 2017-01-28 DIAGNOSIS — R0789 Other chest pain: Secondary | ICD-10-CM | POA: Insufficient documentation

## 2017-01-28 LAB — BASIC METABOLIC PANEL
ANION GAP: 11 (ref 5–15)
BUN: 13 mg/dL (ref 6–20)
CALCIUM: 8.8 mg/dL — AB (ref 8.9–10.3)
CO2: 27 mmol/L (ref 22–32)
CREATININE: 1.83 mg/dL — AB (ref 0.61–1.24)
Chloride: 96 mmol/L — ABNORMAL LOW (ref 101–111)
GFR calc Af Amer: 40 mL/min — ABNORMAL LOW (ref >60)
GFR, EST NON AFRICAN AMERICAN: 35 mL/min — AB (ref >60)
GLUCOSE: 96 mg/dL (ref 65–99)
Potassium: 4.2 mmol/L (ref 3.5–5.1)
Sodium: 134 mmol/L — ABNORMAL LOW (ref 135–145)

## 2017-01-28 LAB — CBC
HCT: 37 % — ABNORMAL LOW (ref 39.0–52.0)
Hemoglobin: 12.4 g/dL — ABNORMAL LOW (ref 13.0–17.0)
MCH: 29 pg (ref 26.0–34.0)
MCHC: 33.5 g/dL (ref 30.0–36.0)
MCV: 86.4 fL (ref 78.0–100.0)
PLATELETS: 117 10*3/uL — AB (ref 150–400)
RBC: 4.28 MIL/uL (ref 4.22–5.81)
RDW: 14.6 % (ref 11.5–15.5)
WBC: 5 10*3/uL (ref 4.0–10.5)

## 2017-01-28 LAB — TROPONIN I

## 2017-01-28 MED ORDER — FENTANYL CITRATE (PF) 100 MCG/2ML IJ SOLN
100.0000 ug | Freq: Once | INTRAMUSCULAR | Status: AC
  Administered 2017-01-28: 100 ug via INTRAVENOUS
  Filled 2017-01-28: qty 2

## 2017-01-28 MED ORDER — IOPAMIDOL (ISOVUE-370) INJECTION 76%
80.0000 mL | Freq: Once | INTRAVENOUS | Status: AC | PRN
  Administered 2017-01-28: 80 mL via INTRAVENOUS

## 2017-01-28 MED ORDER — HYDROCODONE-ACETAMINOPHEN 5-325 MG PO TABS
1.0000 | ORAL_TABLET | Freq: Once | ORAL | Status: AC
  Administered 2017-01-28: 1 via ORAL
  Filled 2017-01-28: qty 1

## 2017-01-28 MED ORDER — HYDROCODONE-ACETAMINOPHEN 5-325 MG PO TABS: 1.0000 | ORAL_TABLET | Freq: Four times a day (QID) | ORAL | 0 refills | Status: DC | PRN

## 2017-01-28 MED ORDER — IBUPROFEN 400 MG PO TABS: 400.0000 mg | ORAL_TABLET | Freq: Four times a day (QID) | ORAL | 0 refills | Status: DC | PRN

## 2017-01-28 MED ORDER — SODIUM CHLORIDE 0.9 % IV BOLUS (SEPSIS): 1000.0000 mL | Freq: Once | INTRAVENOUS | Status: DC

## 2017-01-28 MED ORDER — LACTATED RINGERS IV BOLUS (SEPSIS)
1000.0000 mL | Freq: Once | INTRAVENOUS | Status: AC
  Administered 2017-01-28: 1000 mL via INTRAVENOUS

## 2017-01-28 NOTE — ED Provider Notes (Signed)
[facility] DEPT Provider Note   CSN: [id number] Arrival date & time: 01/28/17  1409     History   Chief Complaint Chief Complaint  Patient presents with  . Chest Pain    HPI Derek [name] is a 76 y.o. male.   Chest Pain   This is a new problem. The current episode started 1 to 2 hours ago. The problem occurs constantly. The problem has not changed since onset.The pain is associated with movement and breathing. The pain is present in the substernal region. The pain is severe. The quality of the pain is described as sharp. The pain does not radiate. Associated symptoms include cough. Pertinent negatives include no abdominal pain, no back pain and no shortness of breath.    Past Medical History:  Diagnosis Date  . Anemia   . Anxiety   . Aortic insufficiency    a. mild-mod by echo 09/2015.  . Arthritis    "knees; left shoulder" (09/21/2013)  . Ascending aortic aneurysm (HCC)    a. last measurement 5.3 cm 03/2016 -> f/u planned 09/2015 to continue to follow.  . Atrial fibrillation ([place])   . B12 deficiency    takes Vit 12 shot every 14days   . Cataract   . CKD (chronic kidney disease) stage 3, GFR 30-59 ml/min 08/22/2011  . Clotting disorder ([place])   . Crohn's disease ([place])   . Depression   . Enlarged prostate   . Enteric hyperoxaluria ([place]) 02/21/2016  . GERD (gastroesophageal reflux disease)    takes Omeprazole daily  . Gout    takes Uloric and Colchicine daily  . Heart murmur   . Hepatitis C 1978   negtive RNA load - spontaneously cleared  . Hiatal hernia   . History of blood transfusion 1978; 1990's; ?   "w/bowel resection; S/P allupurinol; ?" (09/21/2013)  . History of colon polyps   . History of kidney stones   . History of MRSA infection 2010  . History of pulmonary embolism 2006   both legs and both lungs /notes 08/26/2008 (09/21/2013)  . History of small bowel obstruction   . History of staph infection 1978  . Hyperoxaluria (HCC)    Intestinal  .  Hypertension   . Insomnia    takes Trazodone nightly  . Internal hemorrhoids   . LV dysfunction    a. h/o EF 45-50% in 2015, normalized on subsequent echoes.  . Nephrolithiasis   . Pancreatitis 2010   elevated lipase and amylase, stranding in tail of pancreas, ? from Humira  . Pancytopenia    Hx of  . Peripheral neuropathy    takes Gabapentin daily  . Pneumonia    hx of   . Post-traumatic stress syndrome    takes Paxil nightly  . PTSD (post-traumatic stress disorder)   . Pulmonary nodule    a. 62m by CT 05/2015, recommended f/u 6-12 months.  . RLS (restless legs syndrome)   . Rosacea conjunctivitis(372.31)    takes Minocin daily  . Secondary hyperparathyroidism ([place] 02/21/2016  . Sinus bradycardia   . Skin cancer    "cut/burned off left ear and face" (09/21/2013)  . Small bowel obstruction ([place]   . Thrombocytopenia ([place]    hx of    Patient Active Problem List   Diagnosis Date Noted  . Aortic valve regurgitation 11/05/2016  . Bowel perforation ([place] 08/27/2016  . Colonic ischemia ([place] 08/27/2016  . Pain of upper abdomen   . Chronic anticoagulation 08/18/2016  . Sinus bradycardia  08/17/2016  . S/P colon resection 08/14/2016  . Adenomatous polyp of transverse colon s/p partial colectomy 08/14/2016 08/14/2016  . Infectious diarrhea   . Chronic diarrhea   . Essential hypertension 05/27/2016  . Hypotension 05/27/2016  . Hypomagnesemia 05/27/2016  . Peripheral neuropathic pain 05/26/2016  . GERD (gastroesophageal reflux disease) 05/26/2016  . PAF (paroxysmal atrial fibrillation) ([place]) 05/26/2016  . Lung nodule 05/12/2016  . SBO (small bowel obstruction) ([place]) 05/11/2016  . Enteric hyperoxaluria ([place]) 02/21/2016  . Secondary hyperparathyroidism ([place]) 02/21/2016  . Pain in joint, shoulder region 10/12/2013  . Decreased range of motion of left shoulder 10/12/2013  . Muscle weakness (generalized) 10/12/2013  . Restriction of joint motion 10/12/2013  . S/P shoulder  replacement 09/21/2013  . Cardiomyopathy ([place]) 09/07/2013  . Anxiety 08/22/2011  . CKD (chronic kidney disease) stage 3, GFR 30-59 ml/min 08/22/2011  . Thrombocytopenia ([place]) 08/22/2011  . CORONARY ATHEROSCLEROSIS NATIVE CORONARY ARTERY 06/25/2010  . Ascending aortic aneurysm ([place]) 06/26/2009  . Crohn's ileocolitis ([place]) 06/26/2009  . B12 DEFICIENCY 05/09/2008  . Gout 05/09/2008  . POST TRAUMATIC STRESS SYNDROME 05/09/2008  . GERD 05/09/2008  . HEPATITIS C Ab positive RNA neg, HX OF 05/09/2008  . PULMONARY EMBOLISM, HX OF 05/09/2008  . GASTRITIS, HX OF 05/09/2008    Past Surgical History:  Procedure Laterality Date  . ANKLE SURGERY Right   . APPENDECTOMY  1978  . BOWEL RESECTION  1978 X 2  . CARDIOVERSION N/A 05/29/2016   Procedure: CARDIOVERSION;  Surgeon: Sanda Klein, MD;  Location: [facility];  Service: Cardiovascular;  Laterality: N/A;  . CHOLECYSTECTOMY    . COLON RESECTION N/A 08/14/2016   Procedure: LAPAROSCOPIC RESECTION TRANSVERSE COLON;  Surgeon: [name], MD;  Location: [facility];  Service: General;  Laterality: N/A;  . COLON SURGERY    . COLONOSCOPY    . ESOPHAGOGASTRODUODENOSCOPY    . EYE SURGERY     cataract surgery bilateral  . FOOT SURGERY Right    "took gout out"  . HEMICOLECTOMY Right   . ILEOCECETOMY  1978   [name] 05/10/2000  (09/21/2013)  . ILEOSTOMY    . INGUINAL HERNIA REPAIR Right   . KNEE ARTHROSCOPY Left   . LAPAROTOMY N/A 08/27/2016   Procedure: EXPLORATORYLAPAROTOMY, LYSIS OF ADHESIONS, ILEOSTOMY, RIGHT COLECTOMY;  Surgeon: [name], MD;  Location: [facility];  Service: General;  Laterality: N/A;  . LIGAMENT REPAIR Left   . TEE WITHOUT CARDIOVERSION N/A 05/29/2016   Procedure: TRANSESOPHAGEAL ECHOCARDIOGRAM (TEE);  Surgeon: Sanda Klein, MD;  Location: [facility];  Service: Cardiovascular;  Laterality: N/A;  . TOTAL SHOULDER ARTHROPLASTY Left 09/21/2013  . TOTAL SHOULDER ARTHROPLASTY Left 09/21/2013   Procedure: LEFT TOTAL SHOULDER  ARTHROPLASTY;  Surgeon: Marin Shutter, MD;  Location: [facility];  Service: Orthopedics;  Laterality: Left;       Home Medications    Prior to Admission medications   Medication Sig Start Date End Date Taking? Authorizing Provider  Acidophilus Lactobacillus CAPS Take 1 capsule by mouth daily. 06/08/16   [name], MD  amiodarone (PACERONE) 200 MG tablet Take 1 tablet (200 mg total) by mouth daily. 11/05/16   End, Harrell Gave, MD  calcium carbonate (TUMS - DOSED IN MG ELEMENTAL CALCIUM) 500 MG chewable tablet Chew 2 tablets by mouth 2 (two) times daily.    [provider]  carboxymethylcellulose (REFRESH PLUS) 0.5 % SOLN Place 1 drop into both eyes 4 (four) times daily.    [provider]  cholecalciferol (VITAMIN D) 1000 units tablet  Take 2,000 Units by mouth daily.     [provider]  cyanocobalamin (,VITAMIN B-12,) 1000 MCG/ML injection Inject 1,000 mcg into the muscle every 14 (fourteen) days.    [provider]  cycloSPORINE (RESTASIS) 0.05 % ophthalmic emulsion Place 1 drop into both eyes 2 (two) times daily.     [provider]  ELIQUIS 5 MG TABS tablet Take 1 tablet (5 mg total) by mouth 2 (two) times daily. 11/05/16   End, Harrell Gave, MD  febuxostat (ULORIC) 40 MG tablet Take 80 mg by mouth daily.    [provider]  gabapentin (NEURONTIN) 100 MG capsule Take 100-200 mg by mouth 2 (two) times daily. Pt takes one capsule in the morning and two at night.    [provider]  HYDROcodone-acetaminophen (NORCO/VICODIN) 5-325 MG tablet Take 1-2 tablets by mouth every 6 (six) hours as needed for severe pain. 01/28/17   Charelle Petrakis, Corene Cornea, MD  ibuprofen (ADVIL,MOTRIN) 400 MG tablet Take 1 tablet (400 mg total) by mouth every 6 (six) hours as needed for moderate pain. 01/28/17   Amilliana Hayworth, Corene Cornea, MD  loperamide (IMODIUM) 2 MG capsule Take 4 mg by mouth 2 (two) times daily.    [provider]  magnesium oxide (MAGNESIUM-OXIDE) 400  (241.3 Mg) MG tablet Take 800 mg by mouth 3 (three) times daily.     [provider]  Multiple Vitamin (MULTIVITAMIN WITH MINERALS) TABS tablet Take 1 tablet by mouth daily.    [provider]  omeprazole (PRILOSEC) 20 MG capsule Take 20 mg by mouth daily.      [provider]  ondansetron (ZOFRAN) 4 MG tablet Take 1 tablet (4 mg total) by mouth every 6 (six) hours. 10/31/16   Nat Christen, MD  oxyCODONE-acetaminophen (PERCOCET) 5-325 MG tablet Take 1-2 tablets by mouth every 4 (four) hours as needed. 10/31/16   Nat Christen, MD  PARoxetine (PAXIL) 40 MG tablet Take 20 mg by mouth at bedtime.    [provider]  terazosin (HYTRIN) 2 MG capsule Take 2 mg by mouth at bedtime.     [provider]  traZODone (DESYREL) 150 MG tablet Take 75-150 mg by mouth at bedtime as needed for sleep.     [provider]    Family History Family History  Problem Relation Age of Onset  . Kidney disease Father   . Hypertension Father   . Aneurysm Mother   . Aneurysm Sister   . Esophageal cancer Neg Hx   . Stomach cancer Neg Hx   . Rectal cancer Neg Hx     Social History Social History  Substance Use Topics  . Smoking status: Former Smoker    Packs/day: 2.00    Years: 20.00    Types: Cigarettes    Quit date: 08/04/1975  . Smokeless tobacco: Former Systems developer    Types: [place] date: 08/03/1978     Comment: 09/21/2013 "quit smoking in the late 1970's; stopped chewing couple years after I quit smoking"  . Alcohol use No     Allergies   Lorazepam and Humira [adalimumab]   Review of Systems Review of Systems  Respiratory: Positive for cough. Negative for shortness of breath.   Cardiovascular: Positive for chest pain.  Gastrointestinal: Negative for abdominal pain.  Musculoskeletal: Negative for back pain.  All other systems reviewed and are negative.    Physical Exam Updated Vital Signs BP (!) 167/78 (BP Location: Left Arm)   Pulse 67   Temp  98.9 F (37.2 C) (Temporal)   Resp 18   Ht 6' (1.829 m)   Wt 91.2 kg (201 lb)   SpO2 95%   BMI 27.26 kg/m   Physical Exam  Constitutional: He is oriented to person, place, and time. He appears well-developed and well-nourished.  HENT:  Head: Normocephalic and atraumatic.  Eyes: Conjunctivae and EOM are normal.  Neck: Normal range of motion.  Cardiovascular: Normal rate.   Pulmonary/Chest: Effort normal. No respiratory distress. He has no wheezes. He has no rales.  Abdominal: Soft. He exhibits no distension. There is no tenderness. There is no guarding.  Ostomy with good output Scars appear well No ttp  Musculoskeletal: Normal range of motion. He exhibits no edema or deformity.  Neurological: He is alert and oriented to person, place, and time. No cranial nerve deficit. Coordination normal.  Skin: Skin is warm and dry. No rash noted. No erythema.  Nursing note and vitals reviewed.    ED Treatments / Results  Labs (all labs ordered are listed, but only abnormal results are displayed) Labs Reviewed  BASIC METABOLIC PANEL - Abnormal; Notable for the following:       Result Value   Sodium 134 (*)    Chloride 96 (*)    Creatinine, Ser 1.83 (*)    Calcium 8.8 (*)    GFR calc non Af Amer 35 (*)    GFR calc Af Amer 40 (*)    All other components within normal limits  CBC - Abnormal; Notable for the following:    Hemoglobin 12.4 (*)    HCT 37.0 (*)    Platelets 117 (*)    All other components within normal limits  TROPONIN I  TROPONIN I    EKG  EKG Interpretation  Date/Time:  Thursday January 28 2017 18:30:06 EDT Ventricular Rate:  79 PR Interval:  194 QRS Duration: 106 QT Interval:  368 QTC Calculation: 422 R Axis:   -32 Text Interpretation:  Sinus rhythm Abnormal R-wave progression, late transition Left ventricular hypertrophy Confirmed by Elnora Morrison [phone]) on 01/29/2017 4:46:05 PM       Radiology Dg Chest 2 View  Result Date: 01/28/2017 CLINICAL DATA:   Acute left chest pain today. History of thoracic aortic aneurysm. EXAM: CHEST  2 VIEW COMPARISON:  10/20/2016 and prior chest radiographs FINDINGS: Cardiomegaly and aortic prominence are unchanged from prior studies. A right PICC line with tip overlying the superior cavoatrial junction is again noted. Mild bibasilar atelectasis noted, slightly increased on the left. There is no evidence of pneumothorax or effusion. No other changes identified. IMPRESSION: Slightly increased left basilar atelectasis, otherwise unchanged appearance of the chest. Electronically Signed   By: Margarette Canada M.D.   On: 01/28/2017 15:49   Ct Angio Chest/abd/pel For Dissection W And/or Wo Contrast  Result Date: 01/28/2017 CLINICAL DATA:  76 year old male with a history of colon cancer and acute left-sided chest pain worse with inspiration. Evaluate for aortic dissection. EXAM: CT ANGIOGRAPHY CHEST, ABDOMEN AND PELVIS TECHNIQUE: Multidetector CT imaging through the chest, abdomen and pelvis was performed using the standard protocol during bolus administration of intravenous contrast. Multiplanar reconstructed images and MIPs were obtained and reviewed to evaluate the vascular anatomy. CONTRAST:  80 mL Isovue 370 COMPARISON:  Reason prior CT scan of the abdomen and pelvis 10/31/2016 ; chest MRA 10/29/2016; CT scan of the chest 10/03/2004 FINDINGS: CTA CHEST FINDINGS Cardiovascular: No evidence of acute intramural hematoma on the initial non contrasted images of the thoracic aorta. Conventional  3 vessel arch anatomy. The branch vessels are highly tortuous. Aneurysmal dilatation of the aortic root measuring up to 5 cm in transverse diameter at the level of the sinuses of Valsalva. Similarly, there is aneurysmal dilatation of the tubular portion of the ascending thoracic aorta with a maximal diameter of 4.5 cm. No effacement of the sino-tubular junction. The aortic arch is ectatic but not aneurysmal. The descending thoracic aorta is normal in  caliber. No evidence of acute dissection. The main a pulmonary artery is normal in size. No central PE. Calcifications are present along the course of the left anterior descending coronary artery. The heart is at the upper limits of normal for size. No pericardial effusion. A right upper extremity PICC is present. The catheter tip terminates at the superior cavoatrial junction. Mediastinum/Nodes: Unremarkable CT appearance of the thyroid gland. No suspicious mediastinal or hilar adenopathy. No soft tissue mediastinal mass. The thoracic esophagus is unremarkable. Lungs/Pleura: Limited by respiratory motion. Nodular opacities are present in both lungs. Assessment is limited by the respiratory motion artifact. The nodule in the right upper lobe measures approximately 5 mm in diameter and may be ground-glass in attenuation (image 15 series 8). Multiple nodules are present throughout the left upper and lower lobes, the largest of which measuring 0.8 cm in the left upper lobe (image 16 series 8). An ill-defined nodular opacity in the inferior periphery of the right upper lobe measures 0.9 cm (image 29 series 8). A nodule in the inferior left lower lobe which was previously identified is stable at 5 mm dating back to 2006. Many of the other pulmonary nodules are new compared to that relatively remote prior imaging. Musculoskeletal: No acute fracture or aggressive appearing lytic or blastic osseous lesion. Review of the MIP images confirms the above findings. CTA ABDOMEN AND PELVIS FINDINGS VASCULAR Aorta: No evidence of dissection or aneurysm. Mild atherosclerotic calcifications along the abdominal aorta. Celiac: Patent and slightly ectatic.  No aneurysm or dissection. SMA: Patent without evidence of aneurysm, dissection, vasculitis or significant stenosis. Renals: The right main renal artery is patent without evidence of stenosis or fibromuscular dysplasia. There is a small accessory right renal artery supplying the lower  pole. Solitary left renal artery without evidence of stenosis or fibromuscular dysplasia. IMA: Patent without evidence of aneurysm, dissection, vasculitis or significant stenosis. Inflow: Patent without evidence of aneurysm, dissection, vasculitis or significant stenosis. Veins: No focal venous abnormality. Review of the MIP images confirms the above findings. NON-VASCULAR Hepatobiliary: No discrete hepatic lesion identified. The gallbladder is surgically absent. No intra or extrahepatic biliary ductal dilatation. Patent portal veins. Pancreas: Fatty atrophy of the pancreas.  Otherwise, unremarkable. Spleen: Splenomegaly.  No discrete splenic lesion. Adrenals/Urinary Tract: Normal bilateral adrenal glands. Highly lobular appearance of the kidneys. While this may represent persistent fetal lobation, multifocal with regions of renal cortical scarring could yield a similar appearance. There are numerous circumscribed low-attenuation lesions bilaterally ranging in size from several mm to 2.3 cm. The larger lesions are water attenuation consistent with simple cysts. The smaller lesions are too small for accurate characterization but also statistically highly likely benign cysts. No evidence of hydronephrosis or definite enhancing renal mass. Punctate nonobstructing stones in the right renal collecting system. There are at least 3 right renal stones. No stones on the left. The ureters and bladder are unremarkable. Stomach/Bowel: Surgical changes of right hemicolectomy with Hartmann's pouch. There is a right lower quadrant diverting ileostomy. Contrast material is present throughout the colon, presumably from prior CT imaging  and contrast administration. Lymphatic: No suspicious lymphadenopathy. Reproductive: Prostate is unremarkable. Other: No abdominal wall hernia or abnormality. No abdominopelvic ascites. Musculoskeletal: No acute fracture or aggressive appearing lytic or blastic osseous lesion. Review of the MIP images  confirms the above findings. IMPRESSION: CTA CHEST 1. No evidence of thoracic aortic dissection or other acute aortic abnormality. 2. Stable aneurysmal dilatation of the aortic root with a maximal transverse diameter of 5 cm measured at the sinuses of Valsalva. 3. Stable aneurysmal dilatation of the tubular portion of the ascending thoracic aorta with a maximal diameter of 4.4 cm. 4. Multiple bilateral pulmonary nodules are noted, but evaluation is limited secondary to significant respiratory motion artifact. Some of the nodules appear to be ground-glass in attenuation, or demonstrates a slightly irregular border. This may represent a multifocal infectious/ inflammatory process such as pneumonia. However, given the patient's known history of colon cancer, metastatic disease is also a consideration. Recommend short interval follow-up with dedicated CT scan of the chest in 4 weeks following an appropriate course of antibiotic therapy. 5. The tip of the right upper extremity PICC terminates at the superior cavoatrial junction in good position. CTA ABD/PELVIS 1. No evidence of abdominal aortic dissection or other acute aortic abnormality. 2. No evidence of metastatic disease within the abdomen or pelvis. 3. Focal mild dilatation of fluid-filled loops of small bowel in the right lower quadrant may represent mild focal ileus or early partial small bowel obstruction related to adhesive disease. Does the patient have right lower quadrant pain? 4. Splenomegaly. 5. Nonobstructing right-sided nephrolithiasis. 6. Additional ancillary findings as above without significant interval change. Signed, Criselda Peaches, MD [facility] Radiology Electronically Signed   By: Jacqulynn Cadet M.D.   On: 01/28/2017 17:15    Procedures Procedures (including critical care time)  Medications Ordered in ED Medications  fentaNYL (SUBLIMAZE) injection 100 mcg (100 mcg Intravenous Given  01/28/17 1503)  lactated ringers bolus 1,000 mL (0 mLs Intravenous Stopped 01/28/17 1703)  iopamidol (ISOVUE-370) 76 % injection 80 mL (80 mLs Intravenous Contrast Given 01/28/17 1558)  fentaNYL (SUBLIMAZE) injection 100 mcg (100 mcg Intravenous Given 01/28/17 1734)  HYDROcodone-acetaminophen (NORCO/VICODIN) 5-325 MG per tablet 1 tablet (1 tablet Oral Given 01/28/17 2024)     Initial Impression / Assessment and Plan / ED Course  I have reviewed the triage vital signs and the nursing notes.  Pertinent labs & imaging results that were available during my care of the patient were reviewed by me and considered in my medical decision making (see chart for details).    Dissection study to evaluate aorta with his history of ascending aneurysm and appears good. Pain controlled, suspect MSK/chest wall. Will get delta troponin and repeat ECG and if all normal, will dc for same.   Workup unremarkable. Pain improved. Stable for dc with pcp follow up.   Final Clinical Impressions(s) / ED Diagnoses   Final diagnoses:  Chest pain, unspecified type    New Prescriptions Discharge Medication List as of 01/28/2017  8:05 PM    START taking these medications   Details  HYDROcodone-acetaminophen (NORCO/VICODIN) 5-325 MG tablet Take 1-2 tablets by mouth every 6 (six) hours as needed for severe pain., Starting Thu 01/28/2017, Print    ibuprofen (ADVIL,MOTRIN) 400 MG tablet Take 1 tablet (400 mg total) by mouth every 6 (six) hours as needed for moderate pain., Starting Thu 01/28/2017, Print         Yamira Papa, Corene Cornea, MD 01/30/17 [phone]

## 2017-01-28 NOTE — Telephone Encounter (Signed)
lmtcb for pt. Will need to know where the [facility] is where he had his CT chest.

## 2017-01-28 NOTE — ED Triage Notes (Signed)
Pt comes in with left chest pain starting this morning. Pt states pain is worse with inspiration. Pt in obvious discomfort. Denies any movement into jaw or left.

## 2017-01-29 NOTE — Telephone Encounter (Signed)
[facility] [place] also says that she almost positive that he had one w/ dr Urbano Heir she can be reached back @ [phone].Hillery Hunter

## 2017-01-29 NOTE — Telephone Encounter (Signed)
Spoke with pt's wife (dpr on file), states that pt had CT in [place]. Routing back to [place] as FYI.

## 2017-02-01 DIAGNOSIS — Z5181 Encounter for therapeutic drug level monitoring: Secondary | ICD-10-CM | POA: Diagnosis not present

## 2017-02-01 DIAGNOSIS — Z432 Encounter for attention to ileostomy: Secondary | ICD-10-CM | POA: Diagnosis not present

## 2017-02-01 DIAGNOSIS — F4310 Post-traumatic stress disorder, unspecified: Secondary | ICD-10-CM | POA: Diagnosis not present

## 2017-02-01 DIAGNOSIS — Z452 Encounter for adjustment and management of vascular access device: Secondary | ICD-10-CM | POA: Diagnosis not present

## 2017-02-01 DIAGNOSIS — I129 Hypertensive chronic kidney disease with stage 1 through stage 4 chronic kidney disease, or unspecified chronic kidney disease: Secondary | ICD-10-CM | POA: Diagnosis not present

## 2017-02-01 DIAGNOSIS — K5090 Crohn's disease, unspecified, without complications: Secondary | ICD-10-CM | POA: Diagnosis not present

## 2017-02-01 DIAGNOSIS — N183 Chronic kidney disease, stage 3 (moderate): Secondary | ICD-10-CM | POA: Diagnosis not present

## 2017-02-04 ENCOUNTER — Ambulatory Visit (INDEPENDENT_AMBULATORY_CARE_PROVIDER_SITE_OTHER): Payer: Medicare Other | Admitting: Internal Medicine

## 2017-02-04 ENCOUNTER — Encounter: Payer: Self-pay | Admitting: Internal Medicine

## 2017-02-04 VITALS — BP 116/62 | HR 94 | Ht 72.0 in | Wt 193.9 lb

## 2017-02-04 DIAGNOSIS — I428 Other cardiomyopathies: Secondary | ICD-10-CM | POA: Diagnosis not present

## 2017-02-04 DIAGNOSIS — I7121 Aneurysm of the ascending aorta, without rupture: Secondary | ICD-10-CM

## 2017-02-04 DIAGNOSIS — I480 Paroxysmal atrial fibrillation: Secondary | ICD-10-CM

## 2017-02-04 DIAGNOSIS — R630 Anorexia: Secondary | ICD-10-CM | POA: Diagnosis not present

## 2017-02-04 DIAGNOSIS — R079 Chest pain, unspecified: Secondary | ICD-10-CM | POA: Diagnosis not present

## 2017-02-04 DIAGNOSIS — I712 Thoracic aortic aneurysm, without rupture: Secondary | ICD-10-CM

## 2017-02-04 NOTE — Telephone Encounter (Signed)
Fax cover sheet was faxed to the [place] requesting pt's most recent CT scan report. Faxed to [phone]

## 2017-02-04 NOTE — Patient Instructions (Addendum)
Medication Instructions:  Your physician recommends that you continue on your current medications as directed. Please refer to the Current Medication list given to you today.   Labwork: NONE ORDERED  Testing/Procedures: NONE ORDERED  Follow-Up: 05/03/17 @ 1:20 WITH DR. END   Any Other Special Instructions Will Be Listed Below (If Applicable).     If you need a refill on your cardiac medications before your next appointment, please call your pharmacy.

## 2017-02-04 NOTE — Progress Notes (Signed)
Follow-up Outpatient Visit Date: 02/04/2017  Primary Care Provider: Sharilyn Sites, [place] [address]  Chief Complaint: Poor appetite  HPI:  Derek [name] is a 76 y.o. year-old male with history of aortic root aneurysm followed by Dr. Servando Snare, aortic regurgitation, nonischemic cardiomyopathy, paroxysmal atrial fibrillation, HTN, hyperlipidemia, chronic kidney disease, and remote PE, who presents for follow-up. I last saw him on 11/05/16, which time he was feeling well, though he was still recovering from abdominal issues leading up to transverse colectomy by Dr. [name] January. His postoperative course was complicated by breakdown of the surgical anastomosis requiring exploratory laparotomy, right colectomy, Orli Degrave ileostomy. He was still receiving IV fluid at home via a PICC line due to high output from his ostomy and concern for dehydration in the setting of chronic kidney disease. At her last visit, we decreased the amiodarone to 200 mg daily and repeated an echocardiogram that demonstrated normalization of LVEF. We obtained PFTs after her last visit that demonstrated decreased DLCO. Derek [name] was evaluated by pulmonology; it was felt reasonable to continue with amiodarone as his lung findings may be related to scarring secondary to prior pneumonia. He scheduled to follow-up with Dr. Chase Caller in 6 months.  Today, Derek [name] reports feeling tired with a poor appetite. This began about a week ago. He has lost 8 pounds since 01/28/17. He was recently seen in the ED due to left-sided chest pain that was sharp and worsened by deep inspiration. It was not exertional and gradually resolved over the course of 3 days. No acute abnormality was identified, including negative TnI x 2, stable EKG, and CTA chest/abdomen without acute changes. TAA was stable without dissection. Derek [name] denies palpitations, shortness of breath, orthopnea, PND, and edema. He has occasional  orthostatic lightheadedness. Due to high ostomy output, he continues to receive IV saline infusions on a daily basis. He also gets weekly labs, and notes that his creatinine was 1.6 on Monday (3 days ago). He is planning to see Dr. [name] next month do discuss ostomy takedown. He has not had any abdominal pain, nausea, or vomiting.  Derek [name] notes feeling "[place]" this time of year in the past. He attributes it to environmental allergies, including tobacco that is grown near his home. However, his wife does not believe that poor appetite has been a problem for him in past years.  --------------------------------------------------------------------------------------------------  Cardiovascular History & Procedures: Cardiovascular Problems:  TAA  Aortic regurgitation  Non-ischemic cardiomyopathy  Paroxysmal atrial fibrillation  Remote pulmonary embolism  Risk Factors:  Hypertension, hyperlipidemia, male gender, and age > 74  Cath/PCI:  None  CV Surgery:  None  EP Procedures and Devices:  None  Non-Invasive Evaluation(s):  TTE (11/25/16): Normal LV size with LVEF of 55-60% with normal wall motion. Grade 1 diastolic dysfunction. Moderate aortic root dilation (4.8 cm) with mild AI. Mild left atrial enlargement. Normal RV size and function.  MRA chest (10/29/16): Stable TAA measuring 5.3 cm at the sinuses of Valsalva and 4.4 cm above the sinotubular junction.  TEE (05/29/17): LVEF 45-50%. Mild to moderate AI. Moderately dilated aortic root. Dilated left atrium without thrombus. Dilated right atrium. No PFO.  TTE (05/27/17): Normal LV size with moderate LVH. LVEF 45-50%. Mild AI. Aortic root measures up to 5.1 cm. Mitral annular calcification. Normal RV size and function. Normal PA pressure.  TTE (09/06/15): Normal LV size with focal basal septal hypertrophy. LVEF 55-60% with grade 1 diastolic dysfunction. Mild to moderate AI with aortic root  measuring up to 5.0 cm. Mild LA  enlargement. Normal RV size and function.  Pharmacologic MPI (09/11/13): Normal apical thinning. No ischemia or scar. LVEF 50%.  Recent CV Pertinent Labs: Lab Results  Component Value Date   CHOL 93 04/19/2015   HDL 31.60 (L) 04/19/2015   LDLDIRECT 28.0 04/19/2015   TRIG 143 09/07/2016   CHOLHDL 3 04/19/2015   INR 1.03 05/26/2016   INR 1.70 (L) 01/18/2006   K 4.2 01/28/2017   K 4.5 11/23/2012   MG 1.7 09/10/2016   BUN 13 01/28/2017   BUN 25.3 11/23/2012   CREATININE 1.83 (H) 01/28/2017   CREATININE 1.8 (H) 09/25/2015   CREATININE 1.9 (H) 11/23/2012    Past medical and surgical history were reviewed and updated in EPIC.  Current Meds  Medication Sig  . Acidophilus Lactobacillus CAPS Take 1 capsule by mouth daily.  Marland Kitchen amiodarone (PACERONE) 200 MG tablet Take 1 tablet (200 mg total) by mouth daily.  . calcium carbonate (TUMS - DOSED IN MG ELEMENTAL CALCIUM) 500 MG chewable tablet Chew 2 tablets by mouth 2 (two) times daily.  . carboxymethylcellulose (REFRESH PLUS) 0.5 % SOLN Place 1 drop into both eyes 4 (four) times daily.  . cholecalciferol (VITAMIN D) 1000 units tablet Take 2,000 Units by mouth daily.   . cyanocobalamin (,VITAMIN B-12,) 1000 MCG/ML injection Inject 1,000 mcg into the muscle every 14 (fourteen) days.  . cycloSPORINE (RESTASIS) 0.05 % ophthalmic emulsion Place 1 drop into both eyes 2 (two) times daily.   Marland Kitchen ELIQUIS 5 MG TABS tablet Take 1 tablet (5 mg total) by mouth 2 (two) times daily.  . febuxostat (ULORIC) 40 MG tablet Take 80 mg by mouth daily.  Marland Kitchen gabapentin (NEURONTIN) 100 MG capsule Take 100-200 mg by mouth 2 (two) times daily. Pt takes one capsule in the morning and two at night.  Marland Kitchen HYDROcodone-acetaminophen (NORCO/VICODIN) 5-325 MG tablet Take 1-2 tablets by mouth every 6 (six) hours as needed for severe pain.  Marland Kitchen ibuprofen (ADVIL,MOTRIN) 400 MG tablet Take 1 tablet (400 mg total) by mouth every 6 (six) hours as needed for moderate pain.  Marland Kitchen loperamide  (IMODIUM) 2 MG capsule Take 4 mg by mouth 2 (two) times daily.  . magnesium oxide (MAGNESIUM-OXIDE) 400 (241.3 Mg) MG tablet Take 800 mg by mouth 3 (three) times daily.   . Multiple Vitamin (MULTIVITAMIN WITH MINERALS) TABS tablet Take 1 tablet by mouth daily.  Marland Kitchen omeprazole (PRILOSEC) 20 MG capsule Take 20 mg by mouth daily.    . ondansetron (ZOFRAN) 4 MG tablet Take 1 tablet (4 mg total) by mouth every 6 (six) hours.  Marland Kitchen oxyCODONE-acetaminophen (PERCOCET) 5-325 MG tablet Take 1-2 tablets by mouth every 4 (four) hours as needed.  Marland Kitchen PARoxetine (PAXIL) 40 MG tablet Take 20 mg by mouth at bedtime.  . sodium bicarbonate 650 MG tablet Take 650 mg by mouth 2 (two) times daily.  . sodium chloride 38.5 mEq/L, potassium chloride 20 mEq/L in dextrose 10 % 1,000 mL Inject 750 mL/hr into the vein continuous.  Marland Kitchen terazosin (HYTRIN) 2 MG capsule Take 2 mg by mouth at bedtime.   . traZODone (DESYREL) 150 MG tablet Take 75-150 mg by mouth at bedtime as needed for sleep.     Allergies: Lorazepam and Humira [adalimumab]  Social History   Social History  . Marital status: Married    Spouse name: N/A  . Number of children: 2  . Years of education: N/A   Occupational History  . Retired  Social History Main Topics  . Smoking status: Former Smoker    Packs/day: 2.00    Years: 20.00    Types: Cigarettes    Quit date: 08/04/1975  . Smokeless tobacco: Former Systems developer    Types: [place] date: 08/03/1978     Comment: 09/21/2013 "quit smoking in the late 1970's; stopped chewing couple years after I quit smoking"  . Alcohol use No  . Drug use: No  . Sexual activity: Not Currently   Other Topics Concern  . Not on file   Social History Narrative   Married 2 children and 6 grandchildren all local   [place] [employer]   Daily caffeine   Does not exercise regularly    Family History  Problem Relation Age of Onset  . Kidney disease Father   . Hypertension Father   . Aneurysm Mother   . Aneurysm Sister     . Esophageal cancer Neg Hx   . Stomach cancer Neg Hx   . Rectal cancer Neg Hx     Review of Systems: A 12-system review of systems was performed and was negative except as noted in the HPI.  --------------------------------------------------------------------------------------------------  Physical Exam: BP 116/62   Pulse 94   Ht 6' (1.829 m)   Wt 193 lb 14.4 oz (88 kg)   BMI 26.30 kg/m   General:  Well-developed man, seated comfortably in the exam room. He is accompanied by his wife. HEENT: No conjunctival pallor or scleral icterus. Moist mucous membranes.  OP clear. Neck: Supple without lymphadenopathy, thyromegaly, JVD, or HJR. Lungs: Normal work of breathing. Clear to auscultation bilaterally without wheezes or crackles. Heart: Regular rate and rhythm without murmurs, rubs, or gallops. Non-displaced PMI. Abd: Bowel sounds present. Soft, NT/ND without hepatosplenomegaly. Right abdominal ostomy with dark liquid output. Pink stoma. Ext: No lower extremity edema. Radial, PT, and DP pulses are 2+ bilaterally. RUE PICC line in place. Skin: Warm and dry without rash.  EKG:  Normal sinus rhythm (HR 95 bpm) with left axis deviation and borderline LVH. No significant change from prior tracing on 01/28/17.  Lab Results  Component Value Date   WBC 5.0 01/28/2017   HGB 12.4 (L) 01/28/2017   HCT 37.0 (L) 01/28/2017   MCV 86.4 01/28/2017   PLT 117 (L) 01/28/2017    Lab Results  Component Value Date   NA 134 (L) 01/28/2017   K 4.2 01/28/2017   CL 96 (L) 01/28/2017   CO2 27 01/28/2017   BUN 13 01/28/2017   CREATININE 1.83 (H) 01/28/2017   GLUCOSE 96 01/28/2017   ALT 13 (L) 10/31/2016    Lab Results  Component Value Date   CHOL 93 04/19/2015   HDL 31.60 (L) 04/19/2015   LDLDIRECT 28.0 04/19/2015   TRIG 143 09/07/2016   CHOLHDL 3 04/19/2015    --------------------------------------------------------------------------------------------------  ASSESSMENT AND  PLAN: Paroxysmal atrial fibrillation Derek [name] remains in sinus rhythm and has not had any symptoms to suggest recurrence of atrial fibrillation. He is tolerating amiodarone and apixaban well. Due to abnormal PFT's he will continue to follow-up with pulmonary. I appreciate Dr. Golden Pop recommendations regarding amiodarone therapy in the setting of underlying lung disease.  Anorexia Non-specific but with evidence of 8 pound weight loss over the last week. Derek [name] does not appear grossly dehydrated. I encouraged him to continue to drink plenty of fluids. If his appetite does not improve over the next 3-4 days, he should see his PCP for further evaluation.  Non-ischemic cardiomyopathy  No symptoms of decompensated heart failure. Derek [name] appears euvolemic to a little dry today. We will not make any medication changes at this time.  Chest pain Pain lasted 3 days and has since resolved. ED workup was unrevealing. Pleuritic nature of pain without exertional component or other associated symptoms is not consistent with angina. We have agreed to defer ischemia testing, though if symptoms recur, we will need to reconsider.  Thoracic aortic aneurysm Stable in size on recent chest CTA. Derek [name] is to continue scheduled follow-up with Dr. Servando Snare.   Follow-up: Return to clinic in 3 months.  Nelva Bush, MD 02/04/2017 1:33 PM

## 2017-02-08 DIAGNOSIS — L570 Actinic keratosis: Secondary | ICD-10-CM | POA: Diagnosis not present

## 2017-02-08 DIAGNOSIS — D225 Melanocytic nevi of trunk: Secondary | ICD-10-CM | POA: Diagnosis not present

## 2017-02-08 DIAGNOSIS — L814 Other melanin hyperpigmentation: Secondary | ICD-10-CM | POA: Diagnosis not present

## 2017-02-08 DIAGNOSIS — L821 Other seborrheic keratosis: Secondary | ICD-10-CM | POA: Diagnosis not present

## 2017-02-09 DIAGNOSIS — Z452 Encounter for adjustment and management of vascular access device: Secondary | ICD-10-CM | POA: Diagnosis not present

## 2017-02-09 DIAGNOSIS — N183 Chronic kidney disease, stage 3 (moderate): Secondary | ICD-10-CM | POA: Diagnosis not present

## 2017-02-09 DIAGNOSIS — K5090 Crohn's disease, unspecified, without complications: Secondary | ICD-10-CM | POA: Diagnosis not present

## 2017-02-09 DIAGNOSIS — B379 Candidiasis, unspecified: Secondary | ICD-10-CM | POA: Diagnosis not present

## 2017-02-09 DIAGNOSIS — Z432 Encounter for attention to ileostomy: Secondary | ICD-10-CM | POA: Diagnosis not present

## 2017-02-09 DIAGNOSIS — I129 Hypertensive chronic kidney disease with stage 1 through stage 4 chronic kidney disease, or unspecified chronic kidney disease: Secondary | ICD-10-CM | POA: Diagnosis not present

## 2017-02-09 DIAGNOSIS — F4310 Post-traumatic stress disorder, unspecified: Secondary | ICD-10-CM | POA: Diagnosis not present

## 2017-02-09 NOTE — Telephone Encounter (Signed)
Form has been placed in MR's records request folder. Will hold onto form till records have been received. Will sign off on message.

## 2017-02-11 ENCOUNTER — Encounter: Payer: Self-pay | Admitting: Internal Medicine

## 2017-02-12 NOTE — Addendum Note (Signed)
Addendum  created 02/12/17 0908 by Nolon Nations, MD   Sign clinical note

## 2017-02-12 NOTE — Anesthesia Postprocedure Evaluation (Signed)
Anesthesia Post Note  Patient: Derek [name]  Procedure(s) Performed: Procedure(s) (LRB): EXPLORATORYLAPAROTOMY, LYSIS OF ADHESIONS, ILEOSTOMY, RIGHT COLECTOMY (N/A)     Anesthesia Post Evaluation  Last Vitals:  Vitals:   09/13/16 2017 09/14/16 0446  BP: 102/69 97/62  Pulse: 73 69  Resp: 19 18  Temp: 36.4 C 36.6 C    Last Pain:  Vitals:   09/14/16 0918  TempSrc:   PainSc: 0-No pain                 Nolon Nations

## 2017-02-16 DIAGNOSIS — K5090 Crohn's disease, unspecified, without complications: Secondary | ICD-10-CM | POA: Diagnosis not present

## 2017-02-16 DIAGNOSIS — Z452 Encounter for adjustment and management of vascular access device: Secondary | ICD-10-CM | POA: Diagnosis not present

## 2017-02-16 DIAGNOSIS — F4310 Post-traumatic stress disorder, unspecified: Secondary | ICD-10-CM | POA: Diagnosis not present

## 2017-02-16 DIAGNOSIS — I129 Hypertensive chronic kidney disease with stage 1 through stage 4 chronic kidney disease, or unspecified chronic kidney disease: Secondary | ICD-10-CM | POA: Diagnosis not present

## 2017-02-16 DIAGNOSIS — Z432 Encounter for attention to ileostomy: Secondary | ICD-10-CM | POA: Diagnosis not present

## 2017-02-16 DIAGNOSIS — N183 Chronic kidney disease, stage 3 (moderate): Secondary | ICD-10-CM | POA: Diagnosis not present

## 2017-02-23 DIAGNOSIS — K912 Postsurgical malabsorption, not elsewhere classified: Secondary | ICD-10-CM | POA: Diagnosis not present

## 2017-02-23 DIAGNOSIS — Z452 Encounter for adjustment and management of vascular access device: Secondary | ICD-10-CM | POA: Diagnosis not present

## 2017-02-23 DIAGNOSIS — Z432 Encounter for attention to ileostomy: Secondary | ICD-10-CM | POA: Diagnosis not present

## 2017-02-23 DIAGNOSIS — N183 Chronic kidney disease, stage 3 (moderate): Secondary | ICD-10-CM | POA: Diagnosis not present

## 2017-02-23 DIAGNOSIS — E538 Deficiency of other specified B group vitamins: Secondary | ICD-10-CM | POA: Diagnosis not present

## 2017-02-23 DIAGNOSIS — C189 Malignant neoplasm of colon, unspecified: Secondary | ICD-10-CM | POA: Diagnosis not present

## 2017-02-23 DIAGNOSIS — K5090 Crohn's disease, unspecified, without complications: Secondary | ICD-10-CM | POA: Diagnosis not present

## 2017-02-23 DIAGNOSIS — M109 Gout, unspecified: Secondary | ICD-10-CM | POA: Diagnosis not present

## 2017-02-23 DIAGNOSIS — K635 Polyp of colon: Secondary | ICD-10-CM | POA: Diagnosis not present

## 2017-02-23 DIAGNOSIS — F4310 Post-traumatic stress disorder, unspecified: Secondary | ICD-10-CM | POA: Diagnosis not present

## 2017-02-23 DIAGNOSIS — I129 Hypertensive chronic kidney disease with stage 1 through stage 4 chronic kidney disease, or unspecified chronic kidney disease: Secondary | ICD-10-CM | POA: Diagnosis not present

## 2017-02-23 DIAGNOSIS — N200 Calculus of kidney: Secondary | ICD-10-CM | POA: Diagnosis not present

## 2017-02-23 DIAGNOSIS — K559 Vascular disorder of intestine, unspecified: Secondary | ICD-10-CM | POA: Diagnosis not present

## 2017-02-23 DIAGNOSIS — D631 Anemia in chronic kidney disease: Secondary | ICD-10-CM | POA: Diagnosis not present

## 2017-02-23 DIAGNOSIS — N2581 Secondary hyperparathyroidism of renal origin: Secondary | ICD-10-CM | POA: Diagnosis not present

## 2017-02-23 DIAGNOSIS — R630 Anorexia: Secondary | ICD-10-CM | POA: Diagnosis not present

## 2017-02-26 ENCOUNTER — Encounter (HOSPITAL_COMMUNITY)
Admission: RE | Admit: 2017-02-26 | Discharge: 2017-02-26 | Disposition: A | Discharge status: Home / Self Care | Payer: Medicare Other | Source: Ambulatory Visit | Attending: Nephrology | Admitting: Nephrology

## 2017-02-26 DIAGNOSIS — D631 Anemia in chronic kidney disease: Secondary | ICD-10-CM | POA: Insufficient documentation

## 2017-02-26 MED ORDER — SODIUM CHLORIDE 0.9% FLUSH
INTRAVENOUS | Status: AC
  Filled 2017-02-26: qty 10

## 2017-02-26 MED ORDER — SODIUM CHLORIDE 0.9 % IV SOLN
INTRAVENOUS | Status: DC
  Administered 2017-02-26: 12:00:00 via INTRAVENOUS

## 2017-02-26 MED ORDER — HEPARIN SOD (PORK) LOCK FLUSH 100 UNIT/ML IV SOLN
INTRAVENOUS | Status: AC
  Filled 2017-02-26: qty 5

## 2017-02-26 MED ORDER — HEPARIN SOD (PORK) LOCK FLUSH 100 UNIT/ML IV SOLN
250.0000 [IU] | Freq: Once | INTRAVENOUS | Status: AC
  Administered 2017-02-26: 250 [IU] via INTRAVENOUS

## 2017-02-26 MED ORDER — SODIUM CHLORIDE 0.9 % IV SOLN
510.0000 mg | INTRAVENOUS | Status: DC
  Administered 2017-02-26: 510 mg via INTRAVENOUS
  Filled 2017-02-26: qty 17

## 2017-03-02 DIAGNOSIS — I129 Hypertensive chronic kidney disease with stage 1 through stage 4 chronic kidney disease, or unspecified chronic kidney disease: Secondary | ICD-10-CM | POA: Diagnosis not present

## 2017-03-02 DIAGNOSIS — F4310 Post-traumatic stress disorder, unspecified: Secondary | ICD-10-CM | POA: Diagnosis not present

## 2017-03-02 DIAGNOSIS — K5090 Crohn's disease, unspecified, without complications: Secondary | ICD-10-CM | POA: Diagnosis not present

## 2017-03-02 DIAGNOSIS — Z432 Encounter for attention to ileostomy: Secondary | ICD-10-CM | POA: Diagnosis not present

## 2017-03-02 DIAGNOSIS — Z452 Encounter for adjustment and management of vascular access device: Secondary | ICD-10-CM | POA: Diagnosis not present

## 2017-03-02 DIAGNOSIS — N183 Chronic kidney disease, stage 3 (moderate): Secondary | ICD-10-CM | POA: Diagnosis not present

## 2017-03-03 DIAGNOSIS — I712 Thoracic aortic aneurysm, without rupture: Secondary | ICD-10-CM | POA: Diagnosis not present

## 2017-03-03 DIAGNOSIS — Z932 Ileostomy status: Secondary | ICD-10-CM | POA: Diagnosis not present

## 2017-03-03 DIAGNOSIS — C184 Malignant neoplasm of transverse colon: Secondary | ICD-10-CM | POA: Diagnosis not present

## 2017-03-03 DIAGNOSIS — I4891 Unspecified atrial fibrillation: Secondary | ICD-10-CM | POA: Diagnosis not present

## 2017-03-04 ENCOUNTER — Other Ambulatory Visit: Payer: Self-pay | Admitting: Surgery

## 2017-03-04 DIAGNOSIS — C184 Malignant neoplasm of transverse colon: Secondary | ICD-10-CM

## 2017-03-05 ENCOUNTER — Encounter (HOSPITAL_COMMUNITY)
Admission: RE | Admit: 2017-03-05 | Discharge: 2017-03-05 | Disposition: A | Discharge status: Home / Self Care | Payer: Medicare Other | Source: Ambulatory Visit | Attending: Nephrology | Admitting: Nephrology

## 2017-03-05 DIAGNOSIS — N189 Chronic kidney disease, unspecified: Secondary | ICD-10-CM | POA: Insufficient documentation

## 2017-03-05 DIAGNOSIS — D631 Anemia in chronic kidney disease: Secondary | ICD-10-CM | POA: Diagnosis not present

## 2017-03-05 MED ORDER — SODIUM CHLORIDE 0.9 % IV SOLN
Freq: Once | INTRAVENOUS | Status: AC
  Administered 2017-03-05: 250 mL via INTRAVENOUS

## 2017-03-05 MED ORDER — SODIUM CHLORIDE 0.9 % IV SOLN
510.0000 mg | Freq: Once | INTRAVENOUS | Status: AC
  Administered 2017-03-05: 510 mg via INTRAVENOUS
  Filled 2017-03-05: qty 17

## 2017-03-05 MED ORDER — HEPARIN SOD (PORK) LOCK FLUSH 100 UNIT/ML IV SOLN
250.0000 [IU] | INTRAVENOUS | Status: AC | PRN
  Administered 2017-03-05: 250 [IU]
  Filled 2017-03-05: qty 5

## 2017-03-08 ENCOUNTER — Ambulatory Visit
Admission: RE | Admit: 2017-03-08 | Discharge: 2017-03-08 | Disposition: A | Discharge status: Home / Self Care | Payer: Medicare Other | Source: Ambulatory Visit | Attending: Surgery | Admitting: Surgery

## 2017-03-08 DIAGNOSIS — C184 Malignant neoplasm of transverse colon: Secondary | ICD-10-CM

## 2017-03-08 DIAGNOSIS — N200 Calculus of kidney: Secondary | ICD-10-CM | POA: Diagnosis not present

## 2017-03-09 DIAGNOSIS — K5090 Crohn's disease, unspecified, without complications: Secondary | ICD-10-CM | POA: Diagnosis not present

## 2017-03-09 DIAGNOSIS — F4310 Post-traumatic stress disorder, unspecified: Secondary | ICD-10-CM | POA: Diagnosis not present

## 2017-03-09 DIAGNOSIS — B379 Candidiasis, unspecified: Secondary | ICD-10-CM | POA: Diagnosis not present

## 2017-03-09 DIAGNOSIS — I129 Hypertensive chronic kidney disease with stage 1 through stage 4 chronic kidney disease, or unspecified chronic kidney disease: Secondary | ICD-10-CM | POA: Diagnosis not present

## 2017-03-09 DIAGNOSIS — N183 Chronic kidney disease, stage 3 (moderate): Secondary | ICD-10-CM | POA: Diagnosis not present

## 2017-03-09 DIAGNOSIS — Z452 Encounter for adjustment and management of vascular access device: Secondary | ICD-10-CM | POA: Diagnosis not present

## 2017-03-09 DIAGNOSIS — Z432 Encounter for attention to ileostomy: Secondary | ICD-10-CM | POA: Diagnosis not present

## 2017-03-14 DIAGNOSIS — I129 Hypertensive chronic kidney disease with stage 1 through stage 4 chronic kidney disease, or unspecified chronic kidney disease: Secondary | ICD-10-CM | POA: Diagnosis not present

## 2017-03-14 DIAGNOSIS — Z452 Encounter for adjustment and management of vascular access device: Secondary | ICD-10-CM | POA: Diagnosis not present

## 2017-03-14 DIAGNOSIS — F329 Major depressive disorder, single episode, unspecified: Secondary | ICD-10-CM | POA: Diagnosis not present

## 2017-03-14 DIAGNOSIS — F4310 Post-traumatic stress disorder, unspecified: Secondary | ICD-10-CM | POA: Diagnosis not present

## 2017-03-14 DIAGNOSIS — K5090 Crohn's disease, unspecified, without complications: Secondary | ICD-10-CM | POA: Diagnosis not present

## 2017-03-14 DIAGNOSIS — Z87891 Personal history of nicotine dependence: Secondary | ICD-10-CM | POA: Diagnosis not present

## 2017-03-14 DIAGNOSIS — G629 Polyneuropathy, unspecified: Secondary | ICD-10-CM | POA: Diagnosis not present

## 2017-03-14 DIAGNOSIS — I4891 Unspecified atrial fibrillation: Secondary | ICD-10-CM | POA: Diagnosis not present

## 2017-03-14 DIAGNOSIS — F419 Anxiety disorder, unspecified: Secondary | ICD-10-CM | POA: Diagnosis not present

## 2017-03-14 DIAGNOSIS — Z96612 Presence of left artificial shoulder joint: Secondary | ICD-10-CM | POA: Diagnosis not present

## 2017-03-14 DIAGNOSIS — E538 Deficiency of other specified B group vitamins: Secondary | ICD-10-CM | POA: Diagnosis not present

## 2017-03-14 DIAGNOSIS — Z432 Encounter for attention to ileostomy: Secondary | ICD-10-CM | POA: Diagnosis not present

## 2017-03-14 DIAGNOSIS — Z85828 Personal history of other malignant neoplasm of skin: Secondary | ICD-10-CM | POA: Diagnosis not present

## 2017-03-14 DIAGNOSIS — N183 Chronic kidney disease, stage 3 (moderate): Secondary | ICD-10-CM | POA: Diagnosis not present

## 2017-03-14 DIAGNOSIS — Z7901 Long term (current) use of anticoagulants: Secondary | ICD-10-CM | POA: Diagnosis not present

## 2017-03-15 DIAGNOSIS — Z432 Encounter for attention to ileostomy: Secondary | ICD-10-CM | POA: Diagnosis not present

## 2017-03-15 DIAGNOSIS — K5090 Crohn's disease, unspecified, without complications: Secondary | ICD-10-CM | POA: Diagnosis not present

## 2017-03-15 DIAGNOSIS — N183 Chronic kidney disease, stage 3 (moderate): Secondary | ICD-10-CM | POA: Diagnosis not present

## 2017-03-15 DIAGNOSIS — I129 Hypertensive chronic kidney disease with stage 1 through stage 4 chronic kidney disease, or unspecified chronic kidney disease: Secondary | ICD-10-CM | POA: Diagnosis not present

## 2017-03-15 DIAGNOSIS — F4310 Post-traumatic stress disorder, unspecified: Secondary | ICD-10-CM | POA: Diagnosis not present

## 2017-03-15 DIAGNOSIS — B379 Candidiasis, unspecified: Secondary | ICD-10-CM | POA: Diagnosis not present

## 2017-03-15 DIAGNOSIS — Z452 Encounter for adjustment and management of vascular access device: Secondary | ICD-10-CM | POA: Diagnosis not present

## 2017-03-16 DIAGNOSIS — I129 Hypertensive chronic kidney disease with stage 1 through stage 4 chronic kidney disease, or unspecified chronic kidney disease: Secondary | ICD-10-CM | POA: Diagnosis not present

## 2017-03-16 DIAGNOSIS — Z432 Encounter for attention to ileostomy: Secondary | ICD-10-CM | POA: Diagnosis not present

## 2017-03-16 DIAGNOSIS — F4310 Post-traumatic stress disorder, unspecified: Secondary | ICD-10-CM | POA: Diagnosis not present

## 2017-03-16 DIAGNOSIS — N183 Chronic kidney disease, stage 3 (moderate): Secondary | ICD-10-CM | POA: Diagnosis not present

## 2017-03-16 DIAGNOSIS — Z452 Encounter for adjustment and management of vascular access device: Secondary | ICD-10-CM | POA: Diagnosis not present

## 2017-03-16 DIAGNOSIS — K5090 Crohn's disease, unspecified, without complications: Secondary | ICD-10-CM | POA: Diagnosis not present

## 2017-03-23 DIAGNOSIS — N183 Chronic kidney disease, stage 3 (moderate): Secondary | ICD-10-CM | POA: Diagnosis not present

## 2017-03-23 DIAGNOSIS — F4310 Post-traumatic stress disorder, unspecified: Secondary | ICD-10-CM | POA: Diagnosis not present

## 2017-03-23 DIAGNOSIS — Z432 Encounter for attention to ileostomy: Secondary | ICD-10-CM | POA: Diagnosis not present

## 2017-03-23 DIAGNOSIS — K5090 Crohn's disease, unspecified, without complications: Secondary | ICD-10-CM | POA: Diagnosis not present

## 2017-03-23 DIAGNOSIS — Z452 Encounter for adjustment and management of vascular access device: Secondary | ICD-10-CM | POA: Diagnosis not present

## 2017-03-23 DIAGNOSIS — I129 Hypertensive chronic kidney disease with stage 1 through stage 4 chronic kidney disease, or unspecified chronic kidney disease: Secondary | ICD-10-CM | POA: Diagnosis not present

## 2017-03-31 DIAGNOSIS — I129 Hypertensive chronic kidney disease with stage 1 through stage 4 chronic kidney disease, or unspecified chronic kidney disease: Secondary | ICD-10-CM | POA: Diagnosis not present

## 2017-03-31 DIAGNOSIS — N183 Chronic kidney disease, stage 3 (moderate): Secondary | ICD-10-CM | POA: Diagnosis not present

## 2017-03-31 DIAGNOSIS — K5090 Crohn's disease, unspecified, without complications: Secondary | ICD-10-CM | POA: Diagnosis not present

## 2017-03-31 DIAGNOSIS — Z432 Encounter for attention to ileostomy: Secondary | ICD-10-CM | POA: Diagnosis not present

## 2017-03-31 DIAGNOSIS — F4310 Post-traumatic stress disorder, unspecified: Secondary | ICD-10-CM | POA: Diagnosis not present

## 2017-03-31 DIAGNOSIS — B379 Candidiasis, unspecified: Secondary | ICD-10-CM | POA: Diagnosis not present

## 2017-03-31 DIAGNOSIS — Z452 Encounter for adjustment and management of vascular access device: Secondary | ICD-10-CM | POA: Diagnosis not present

## 2017-04-05 ENCOUNTER — Other Ambulatory Visit (HOSPITAL_COMMUNITY)
Admission: RE | Admit: 2017-04-05 | Discharge: 2017-04-05 | Disposition: A | Discharge status: Home / Self Care | Payer: Medicare Other | Source: Skilled Nursing Facility | Attending: Nephrology | Admitting: Nephrology

## 2017-04-05 DIAGNOSIS — K5090 Crohn's disease, unspecified, without complications: Secondary | ICD-10-CM | POA: Diagnosis not present

## 2017-04-05 DIAGNOSIS — F4310 Post-traumatic stress disorder, unspecified: Secondary | ICD-10-CM | POA: Diagnosis not present

## 2017-04-05 DIAGNOSIS — Z452 Encounter for adjustment and management of vascular access device: Secondary | ICD-10-CM | POA: Diagnosis not present

## 2017-04-05 DIAGNOSIS — N183 Chronic kidney disease, stage 3 (moderate): Secondary | ICD-10-CM | POA: Diagnosis not present

## 2017-04-05 DIAGNOSIS — I129 Hypertensive chronic kidney disease with stage 1 through stage 4 chronic kidney disease, or unspecified chronic kidney disease: Secondary | ICD-10-CM | POA: Diagnosis not present

## 2017-04-05 DIAGNOSIS — Z432 Encounter for attention to ileostomy: Secondary | ICD-10-CM | POA: Diagnosis not present

## 2017-04-05 LAB — BASIC METABOLIC PANEL
ANION GAP: 12 (ref 5–15)
BUN: 16 mg/dL (ref 6–20)
CHLORIDE: 100 mmol/L — AB (ref 101–111)
CO2: 25 mmol/L (ref 22–32)
Calcium: 8.8 mg/dL — ABNORMAL LOW (ref 8.9–10.3)
Creatinine, Ser: 1.66 mg/dL — ABNORMAL HIGH (ref 0.61–1.24)
GFR, EST AFRICAN AMERICAN: 45 mL/min — AB (ref >60)
GFR, EST NON AFRICAN AMERICAN: 39 mL/min — AB (ref >60)
Glucose, Bld: 95 mg/dL (ref 65–99)
POTASSIUM: 3.9 mmol/L (ref 3.5–5.1)
SODIUM: 137 mmol/L (ref 135–145)

## 2017-04-12 ENCOUNTER — Other Ambulatory Visit (HOSPITAL_COMMUNITY)
Admission: AD | Admit: 2017-04-12 | Discharge: 2017-04-12 | Disposition: A | Discharge status: Home / Self Care | Payer: Medicare Other | Source: Skilled Nursing Facility | Attending: Nephrology | Admitting: Nephrology

## 2017-04-12 DIAGNOSIS — N183 Chronic kidney disease, stage 3 (moderate): Secondary | ICD-10-CM | POA: Insufficient documentation

## 2017-04-12 DIAGNOSIS — K5090 Crohn's disease, unspecified, without complications: Secondary | ICD-10-CM | POA: Diagnosis not present

## 2017-04-12 DIAGNOSIS — F4310 Post-traumatic stress disorder, unspecified: Secondary | ICD-10-CM | POA: Diagnosis not present

## 2017-04-12 DIAGNOSIS — Z432 Encounter for attention to ileostomy: Secondary | ICD-10-CM | POA: Diagnosis not present

## 2017-04-12 DIAGNOSIS — Z452 Encounter for adjustment and management of vascular access device: Secondary | ICD-10-CM | POA: Diagnosis not present

## 2017-04-12 DIAGNOSIS — I129 Hypertensive chronic kidney disease with stage 1 through stage 4 chronic kidney disease, or unspecified chronic kidney disease: Secondary | ICD-10-CM | POA: Diagnosis not present

## 2017-04-12 LAB — BASIC METABOLIC PANEL
ANION GAP: 6 (ref 5–15)
BUN: 16 mg/dL (ref 6–20)
CO2: 27 mmol/L (ref 22–32)
Calcium: 8 mg/dL — ABNORMAL LOW (ref 8.9–10.3)
Chloride: 104 mmol/L (ref 101–111)
Creatinine, Ser: 1.48 mg/dL — ABNORMAL HIGH (ref 0.61–1.24)
GFR, EST AFRICAN AMERICAN: 52 mL/min — AB (ref >60)
GFR, EST NON AFRICAN AMERICAN: 45 mL/min — AB (ref >60)
Glucose, Bld: 110 mg/dL — ABNORMAL HIGH (ref 65–99)
Potassium: 3.9 mmol/L (ref 3.5–5.1)
SODIUM: 137 mmol/L (ref 135–145)

## 2017-04-15 DIAGNOSIS — C184 Malignant neoplasm of transverse colon: Secondary | ICD-10-CM | POA: Diagnosis not present

## 2017-04-15 DIAGNOSIS — Z932 Ileostomy status: Secondary | ICD-10-CM | POA: Diagnosis not present

## 2017-04-15 DIAGNOSIS — I4891 Unspecified atrial fibrillation: Secondary | ICD-10-CM | POA: Diagnosis not present

## 2017-04-15 DIAGNOSIS — I712 Thoracic aortic aneurysm, without rupture: Secondary | ICD-10-CM | POA: Diagnosis not present

## 2017-04-19 ENCOUNTER — Telehealth: Payer: Self-pay | Admitting: Internal Medicine

## 2017-04-19 DIAGNOSIS — N183 Chronic kidney disease, stage 3 (moderate): Secondary | ICD-10-CM | POA: Diagnosis not present

## 2017-04-19 DIAGNOSIS — Z452 Encounter for adjustment and management of vascular access device: Secondary | ICD-10-CM | POA: Diagnosis not present

## 2017-04-19 DIAGNOSIS — F4310 Post-traumatic stress disorder, unspecified: Secondary | ICD-10-CM | POA: Diagnosis not present

## 2017-04-19 DIAGNOSIS — I129 Hypertensive chronic kidney disease with stage 1 through stage 4 chronic kidney disease, or unspecified chronic kidney disease: Secondary | ICD-10-CM | POA: Diagnosis not present

## 2017-04-19 DIAGNOSIS — Z432 Encounter for attention to ileostomy: Secondary | ICD-10-CM | POA: Diagnosis not present

## 2017-04-19 DIAGNOSIS — K5090 Crohn's disease, unspecified, without complications: Secondary | ICD-10-CM | POA: Diagnosis not present

## 2017-04-19 NOTE — Telephone Encounter (Signed)
Pt needs cardiac clearance for surgery by [name], appt with Dr End moved from 05/07/17 to 04/23/17 to evaluate for surgery at pt's request, clearance request in Dr Darnelle Bos box, will hold for appt 04/23/17.

## 2017-04-19 NOTE — Telephone Encounter (Signed)
New Message  Pt wife call requesting to speak with RN. She states she is returning a call. Please call back to discuss

## 2017-04-21 ENCOUNTER — Encounter: Payer: Self-pay | Admitting: Internal Medicine

## 2017-04-23 ENCOUNTER — Encounter: Payer: Self-pay | Admitting: Internal Medicine

## 2017-04-23 ENCOUNTER — Ambulatory Visit (INDEPENDENT_AMBULATORY_CARE_PROVIDER_SITE_OTHER): Payer: Medicare Other | Admitting: Internal Medicine

## 2017-04-23 VITALS — BP 100/70 | HR 75 | Ht 72.0 in | Wt 194.2 lb

## 2017-04-23 DIAGNOSIS — I7121 Aneurysm of the ascending aorta, without rupture: Secondary | ICD-10-CM

## 2017-04-23 DIAGNOSIS — I712 Thoracic aortic aneurysm, without rupture: Secondary | ICD-10-CM | POA: Diagnosis not present

## 2017-04-23 DIAGNOSIS — Z01810 Encounter for preprocedural cardiovascular examination: Secondary | ICD-10-CM

## 2017-04-23 DIAGNOSIS — I480 Paroxysmal atrial fibrillation: Secondary | ICD-10-CM | POA: Diagnosis not present

## 2017-04-23 DIAGNOSIS — I428 Other cardiomyopathies: Secondary | ICD-10-CM

## 2017-04-23 NOTE — Patient Instructions (Addendum)
Medication Instructions:  Your physician recommends that you continue on your current medications as directed. Please refer to the Current Medication list given to you today.   Labwork:  Liver profile/TSH/Free T4 -I talked with [facility] at [facility].  Testing/Procedures: None   Follow-Up: Your physician wants you to follow-up in: 6 months with Dr End. ( March 2019). You will receive a reminder letter in the mail two months in advance. If you don't receive a letter, please call our office to schedule the follow-up appointment.        If you need a refill on your cardiac medications before your next appointment, please call your pharmacy.

## 2017-04-23 NOTE — Progress Notes (Signed)
Follow-up Outpatient Visit Date: 04/23/2017  Primary Care Provider: Sharilyn Sites, [place] [address]  Chief Complaint: Preoperative evaluation and follow-up of paroxysmal atrial fibrillation  HPI:  Derek [name] is a 76 y.o. year-old male with history of aortic root aneurysm followed by Dr. Servando Snare, aortic regurgitation, nonischemic cardiomyopathy, paroxysmal atrial fibrillation, HTN, hyperlipidemia, chronic kidney disease, and remote PE, who presents for follow-up of paroxysmal atrial fibrillation as well as preoperative cardiovascular risk assessment. I last saw him in early July, at which time he was struggling with dehydration due to high ostomy output. He was receiving IV fluid boluses through a PICC line. He noted significant fatigue and poor appetite.  Today, Derek [name] reports feeling better. His appetite is still not great but has improved. He is also put on a few pounds. He is using supplemental shakes to help with his protein and caloric intake. He continues to get daily normal saline infusions of 1500 mL, which are monitored by Dr. Jimmy Footman. Derek [name] has not had any chest pain, shortness of breath, palpitations, edema, orthopnea, or PND. He notes occasional brief orthostatic lightheadedness, change. He is tolerating apixaban well without significant bleeding.  --------------------------------------------------------------------------------------------------  Cardiovascular History & Procedures: Cardiovascular Problems:  TAA  Aortic regurgitation  Non-ischemic cardiomyopathy  Paroxysmal atrial fibrillation  Remote pulmonary embolism  Risk Factors:  Hypertension, hyperlipidemia, male gender, and age > 49  Cath/PCI:  None  CV Surgery:  None  EP Procedures and Devices:  None  Non-Invasive Evaluation(s):  TTE (11/25/16): Normal LV size with LVEF of 55-60% with normal wall motion. Grade 1 diastolic dysfunction. Moderate  aortic root dilation (4.8 cm) with mild AI. Mild left atrial enlargement. Normal RV size and function.  MRA chest (10/29/16): Stable TAA measuring 5.3 cm at the sinuses of Valsalva and 4.4 cm above the sinotubular junction.  TEE (05/29/17): LVEF 45-50%. Mild to moderate AI. Moderately dilated aortic root. Dilated left atrium without thrombus. Dilated right atrium. No PFO.  TTE (05/27/17): Normal LV size with moderate LVH. LVEF 45-50%. Mild AI. Aortic root measures up to 5.1 cm. Mitral annular calcification. Normal RV size and function. Normal PA pressure.  TTE (09/06/15): Normal LV size with focal basal septal hypertrophy. LVEF 55-60% with grade 1 diastolic dysfunction. Mild to moderate AI with aortic root measuring up to 5.0 cm. Mild LA enlargement. Normal RV size and function.  Pharmacologic MPI (09/11/13): Normal apical thinning. No ischemia or scar. LVEF 50%.  Recent CV Pertinent Labs: Lab Results  Component Value Date   CHOL 93 04/19/2015   HDL 31.60 (L) 04/19/2015   LDLDIRECT 28.0 04/19/2015   TRIG 143 09/07/2016   CHOLHDL 3 04/19/2015   INR 1.03 05/26/2016   INR 1.70 (L) 01/18/2006   K 3.9 04/12/2017   K 4.5 11/23/2012   MG 1.7 09/10/2016   BUN 16 04/12/2017   BUN 25.3 11/23/2012   CREATININE 1.48 (H) 04/12/2017   CREATININE 1.8 (H) 09/25/2015   CREATININE 1.9 (H) 11/23/2012    Past medical and surgical history were reviewed and updated in EPIC.  Current Meds  Medication Sig  . Acidophilus Lactobacillus CAPS Take 1 capsule by mouth daily.  Marland Kitchen amiodarone (PACERONE) 200 MG tablet Take 1 tablet (200 mg total) by mouth daily.  . calcium carbonate (TUMS - DOSED IN MG ELEMENTAL CALCIUM) 500 MG chewable tablet Chew 2 tablets by mouth 2 (two) times daily.  . carboxymethylcellulose (REFRESH PLUS) 0.5 % SOLN Place 1 drop into both eyes 4 (four)  times daily.  . cholecalciferol (VITAMIN D) 1000 units tablet Take 2,000 Units by mouth daily.   . cyanocobalamin (,VITAMIN B-12,) 1000 MCG/ML  injection Inject 1,000 mcg into the muscle every 14 (fourteen) days.  . cycloSPORINE (RESTASIS) 0.05 % ophthalmic emulsion Place 1 drop into both eyes 2 (two) times daily.   Marland Kitchen ELIQUIS 5 MG TABS tablet Take 1 tablet (5 mg total) by mouth 2 (two) times daily.  . febuxostat (ULORIC) 40 MG tablet Take 80 mg by mouth daily.  Marland Kitchen HYDROcodone-acetaminophen (NORCO/VICODIN) 5-325 MG tablet Take 1-2 tablets by mouth every 6 (six) hours as needed for severe pain.  Marland Kitchen ibuprofen (ADVIL,MOTRIN) 400 MG tablet Take 1 tablet (400 mg total) by mouth every 6 (six) hours as needed for moderate pain.  Marland Kitchen loperamide (IMODIUM) 2 MG capsule Take 4 mg by mouth 2 (two) times daily.  . magnesium oxide (MAGNESIUM-OXIDE) 400 (241.3 Mg) MG tablet Take 800 mg by mouth 3 (three) times daily.   . Multiple Vitamin (MULTIVITAMIN WITH MINERALS) TABS tablet Take 1 tablet by mouth daily.  Marland Kitchen omeprazole (PRILOSEC) 20 MG capsule Take 20 mg by mouth daily.    . ondansetron (ZOFRAN) 4 MG tablet Take 1 tablet (4 mg total) by mouth every 6 (six) hours.  Marland Kitchen oxyCODONE-acetaminophen (PERCOCET) 5-325 MG tablet Take 1-2 tablets by mouth every 4 (four) hours as needed.  Marland Kitchen PARoxetine (PAXIL) 40 MG tablet Take 20 mg by mouth at bedtime.  . sodium bicarbonate 650 MG tablet Take 650 mg by mouth 2 (two) times daily.  . sodium chloride 38.5 mEq/L, potassium chloride 20 mEq/L in dextrose 10 % 1,000 mL Inject 750 mL/hr into the vein continuous.  Marland Kitchen terazosin (HYTRIN) 2 MG capsule Take 2 mg by mouth at bedtime.   . traZODone (DESYREL) 150 MG tablet Take 75-150 mg by mouth at bedtime as needed for sleep.     Allergies: Lorazepam and Humira [adalimumab]  Social History   Social History  . Marital status: Married    Spouse name: N/A  . Number of children: 2  . Years of education: N/A   Occupational History  . Retired    Social History Main Topics  . Smoking status: Former Smoker    Packs/day: 2.00    Years: 20.00    Types: Cigarettes    Quit date:  08/04/1975  . Smokeless tobacco: Former Systems developer    Types: [place] date: 08/03/1978     Comment: 09/21/2013 "quit smoking in the late 1970's; stopped chewing couple years after I quit smoking"  . Alcohol use No  . Drug use: No  . Sexual activity: Not Currently   Other Topics Concern  . Not on file   Social History Narrative   Married 2 children and 6 grandchildren all local   [place] [employer]   Daily caffeine   Does not exercise regularly    Family History  Problem Relation Age of Onset  . Kidney disease Father   . Hypertension Father   . Aneurysm Mother   . Aneurysm Sister   . Esophageal cancer Neg Hx   . Stomach cancer Neg Hx   . Rectal cancer Neg Hx     Review of Systems: A 12-system review of systems was performed and was negative except as noted in the HPI.  --------------------------------------------------------------------------------------------------  Physical Exam: BP 100/70   Pulse 75   Ht 6' (1.829 m)   Wt 194 lb 3.2 oz (88.1 kg)   SpO2 97%  BMI 26.34 kg/m   General:  Well-developed, well-nourished man, seated comfortably in the exam room. He is accompanied by his wife. HEENT: No conjunctival pallor or scleral icterus. Moist mucous membranes.  OP clear. Neck: Supple without lymphadenopathy, thyromegaly, JVD, or HJR. Lungs: Normal work of breathing. Clear to auscultation bilaterally without wheezes or crackles. Heart: Regular rate and rhythm without murmurs, rubs, or gallops. Non-displaced PMI. Abd: Bowel sounds present. Soft, NT/ND without hepatosplenomegaly. Ostomy is present with brown fluid in the ostomy bag. Ext: No lower extremity edema. Radial, PT, and DP pulses are 2+ bilaterally. Skin: Warm and dry without rash.  I personally observed Derek [name] climb 2 flights of stairs without any difficulty. He denied chest pain and shortness of breath.  EKG:  Normal sinus rhythm with PAC and leftward axis. Otherwise, no significant abnormalities.  Lab  Results  Component Value Date   WBC 5.0 01/28/2017   HGB 12.4 (L) 01/28/2017   HCT 37.0 (L) 01/28/2017   MCV 86.4 01/28/2017   PLT 117 (L) 01/28/2017    Lab Results  Component Value Date   NA 137 04/12/2017   K 3.9 04/12/2017   CL 104 04/12/2017   CO2 27 04/12/2017   BUN 16 04/12/2017   CREATININE 1.48 (H) 04/12/2017   GLUCOSE 110 (H) 04/12/2017   ALT 13 (L) 10/31/2016    Lab Results  Component Value Date   CHOL 93 04/19/2015   HDL 31.60 (L) 04/19/2015   LDLDIRECT 28.0 04/19/2015   TRIG 143 09/07/2016   CHOLHDL 3 04/19/2015    --------------------------------------------------------------------------------------------------  ASSESSMENT AND PLAN: Paroxysmal atrial fibrillation No symptoms to suggest recurrence, though Derek [name] was asymptomatic while in atrial fibrillation during prior hospitalization. He is tolerating amiodarone and apixaban well. We will continue these medications and definitely. We will check a TSH and liver function panel with his next home blood draw. We will continue with annual PFTs and pulmonary follow-up, if necessary.  Nonischemic cardiomyopathy LVEF normalized by last echo. Derek [name] appears euvolemic today. No medication changes at this time.  Ascending aortic aneurysm No new symptoms. Blood pressure is well controlled. Continue follow-up with Dr. Servando Snare.  Preoperative cardiovascular risk assessment Derek [name] does not have any symptoms to suggest unstable cardiac disease. He is able to perform more than 4 METs of activity without difficulty. I feel it is reasonable for him to proceed with upcoming abdominal surgery without additional testing or intervention. Apixaban should be discontinued 48 hours before surgery and restarted when it is felt safe to do so from a surgical standpoint.  Follow-up: Return to clinic in 6 months.  Nelva Bush, MD 04/23/2017 11:48 AM

## 2017-04-26 DIAGNOSIS — Z452 Encounter for adjustment and management of vascular access device: Secondary | ICD-10-CM | POA: Diagnosis not present

## 2017-04-26 DIAGNOSIS — F4310 Post-traumatic stress disorder, unspecified: Secondary | ICD-10-CM | POA: Diagnosis not present

## 2017-04-26 DIAGNOSIS — M109 Gout, unspecified: Secondary | ICD-10-CM | POA: Diagnosis not present

## 2017-04-26 DIAGNOSIS — N2581 Secondary hyperparathyroidism of renal origin: Secondary | ICD-10-CM | POA: Diagnosis not present

## 2017-04-26 DIAGNOSIS — K5090 Crohn's disease, unspecified, without complications: Secondary | ICD-10-CM | POA: Diagnosis not present

## 2017-04-26 DIAGNOSIS — N183 Chronic kidney disease, stage 3 (moderate): Secondary | ICD-10-CM | POA: Diagnosis not present

## 2017-04-26 DIAGNOSIS — C189 Malignant neoplasm of colon, unspecified: Secondary | ICD-10-CM | POA: Diagnosis not present

## 2017-04-26 DIAGNOSIS — K912 Postsurgical malabsorption, not elsewhere classified: Secondary | ICD-10-CM | POA: Diagnosis not present

## 2017-04-26 DIAGNOSIS — Z432 Encounter for attention to ileostomy: Secondary | ICD-10-CM | POA: Diagnosis not present

## 2017-04-26 DIAGNOSIS — R6883 Chills (without fever): Secondary | ICD-10-CM | POA: Diagnosis not present

## 2017-04-26 DIAGNOSIS — K559 Vascular disorder of intestine, unspecified: Secondary | ICD-10-CM | POA: Diagnosis not present

## 2017-04-26 DIAGNOSIS — I480 Paroxysmal atrial fibrillation: Secondary | ICD-10-CM | POA: Diagnosis not present

## 2017-04-26 DIAGNOSIS — R630 Anorexia: Secondary | ICD-10-CM | POA: Diagnosis not present

## 2017-04-26 DIAGNOSIS — Z0000 Encounter for general adult medical examination without abnormal findings: Secondary | ICD-10-CM | POA: Diagnosis not present

## 2017-04-26 DIAGNOSIS — Z23 Encounter for immunization: Secondary | ICD-10-CM | POA: Diagnosis not present

## 2017-04-26 DIAGNOSIS — N200 Calculus of kidney: Secondary | ICD-10-CM | POA: Diagnosis not present

## 2017-04-26 DIAGNOSIS — D631 Anemia in chronic kidney disease: Secondary | ICD-10-CM | POA: Diagnosis not present

## 2017-04-26 DIAGNOSIS — I129 Hypertensive chronic kidney disease with stage 1 through stage 4 chronic kidney disease, or unspecified chronic kidney disease: Secondary | ICD-10-CM | POA: Diagnosis not present

## 2017-04-26 DIAGNOSIS — K635 Polyp of colon: Secondary | ICD-10-CM | POA: Diagnosis not present

## 2017-05-03 ENCOUNTER — Ambulatory Visit: Payer: Medicare Other | Admitting: Internal Medicine

## 2017-05-03 DIAGNOSIS — M1830 Unilateral post-traumatic osteoarthritis of first carpometacarpal joint, unspecified hand: Secondary | ICD-10-CM | POA: Diagnosis not present

## 2017-05-03 DIAGNOSIS — I129 Hypertensive chronic kidney disease with stage 1 through stage 4 chronic kidney disease, or unspecified chronic kidney disease: Secondary | ICD-10-CM | POA: Diagnosis not present

## 2017-05-03 DIAGNOSIS — K5090 Crohn's disease, unspecified, without complications: Secondary | ICD-10-CM | POA: Diagnosis not present

## 2017-05-03 DIAGNOSIS — Z452 Encounter for adjustment and management of vascular access device: Secondary | ICD-10-CM | POA: Diagnosis not present

## 2017-05-03 DIAGNOSIS — Z432 Encounter for attention to ileostomy: Secondary | ICD-10-CM | POA: Diagnosis not present

## 2017-05-03 DIAGNOSIS — F4310 Post-traumatic stress disorder, unspecified: Secondary | ICD-10-CM | POA: Diagnosis not present

## 2017-05-03 DIAGNOSIS — N183 Chronic kidney disease, stage 3 (moderate): Secondary | ICD-10-CM | POA: Diagnosis not present

## 2017-05-04 ENCOUNTER — Other Ambulatory Visit: Payer: Self-pay | Admitting: Surgery

## 2017-05-06 ENCOUNTER — Telehealth: Payer: Self-pay | Admitting: Internal Medicine

## 2017-05-06 ENCOUNTER — Other Ambulatory Visit (HOSPITAL_COMMUNITY): Payer: Self-pay | Admitting: Surgery

## 2017-05-06 ENCOUNTER — Other Ambulatory Visit: Payer: Self-pay | Admitting: Physician Assistant

## 2017-05-06 ENCOUNTER — Ambulatory Visit (HOSPITAL_COMMUNITY): Payer: Medicare Other

## 2017-05-06 DIAGNOSIS — N289 Disorder of kidney and ureter, unspecified: Secondary | ICD-10-CM

## 2017-05-06 DIAGNOSIS — Z452 Encounter for adjustment and management of vascular access device: Secondary | ICD-10-CM | POA: Diagnosis not present

## 2017-05-06 DIAGNOSIS — N183 Chronic kidney disease, stage 3 (moderate): Secondary | ICD-10-CM | POA: Diagnosis not present

## 2017-05-06 DIAGNOSIS — I129 Hypertensive chronic kidney disease with stage 1 through stage 4 chronic kidney disease, or unspecified chronic kidney disease: Secondary | ICD-10-CM | POA: Diagnosis not present

## 2017-05-06 DIAGNOSIS — K5090 Crohn's disease, unspecified, without complications: Secondary | ICD-10-CM | POA: Diagnosis not present

## 2017-05-06 DIAGNOSIS — F4310 Post-traumatic stress disorder, unspecified: Secondary | ICD-10-CM | POA: Diagnosis not present

## 2017-05-06 DIAGNOSIS — Z432 Encounter for attention to ileostomy: Secondary | ICD-10-CM | POA: Diagnosis not present

## 2017-05-06 NOTE — Telephone Encounter (Signed)
Pt's wife aware pt should not take cipro with amiodarone, Flagyl should be ok, wife verbalized understanding, she will f/u with prescribing MD for alternative.

## 2017-05-06 NOTE — Telephone Encounter (Signed)
The Flagyl should be ok, but I agree that pt should not take cipro with amiodarone due to increased risk of QTc prolongation and Torsades. Would recommend pt f/u with his prescribing MD for alternative antibiotic that is not QTc prolonging.

## 2017-05-06 NOTE — Telephone Encounter (Signed)
LMTCB for pt's wife.

## 2017-05-06 NOTE — Telephone Encounter (Signed)
I will forward to Pharmacist for review.

## 2017-05-06 NOTE — Telephone Encounter (Signed)
New message    Pt wife is calling.  Pt c/o medication issue:  1. Name of Medication: entresto   2. How are you currently taking this medication (dosage and times per day)?   3. Are you having a reaction (difficulty breathing--STAT)? no  4. What is your medication issue? Pt wife states that pt needs antibiotic-ciprofloxcin and metronidazole and the drugest states that the antibiotic that the doctor wants to prescribe will interfere with his entresto.

## 2017-05-06 NOTE — Telephone Encounter (Signed)
Pt's wife called back, states pharmacist concerned about interaction between amiodarone, not entresto,  and ciprofloxin and metronidazole.

## 2017-05-07 ENCOUNTER — Ambulatory Visit: Payer: Medicare Other | Admitting: Internal Medicine

## 2017-05-07 ENCOUNTER — Encounter (HOSPITAL_COMMUNITY): Payer: Self-pay | Admitting: Interventional Radiology

## 2017-05-07 ENCOUNTER — Ambulatory Visit (HOSPITAL_COMMUNITY)
Admission: RE | Admit: 2017-05-07 | Discharge: 2017-05-07 | Disposition: A | Discharge status: Home / Self Care | Payer: Medicare Other | Source: Ambulatory Visit | Attending: Surgery | Admitting: Surgery

## 2017-05-07 ENCOUNTER — Emergency Department (HOSPITAL_COMMUNITY)
Admission: EM | Admit: 2017-05-07 | Discharge: 2017-05-07 | Disposition: A | Discharge status: Home / Self Care | Payer: Medicare Other | Source: Home / Self Care | Attending: Physician Assistant | Admitting: Physician Assistant

## 2017-05-07 ENCOUNTER — Emergency Department (HOSPITAL_COMMUNITY): Payer: Medicare Other

## 2017-05-07 ENCOUNTER — Other Ambulatory Visit (HOSPITAL_COMMUNITY): Payer: Self-pay | Admitting: Surgery

## 2017-05-07 DIAGNOSIS — I2510 Atherosclerotic heart disease of native coronary artery without angina pectoris: Secondary | ICD-10-CM

## 2017-05-07 DIAGNOSIS — N289 Disorder of kidney and ureter, unspecified: Secondary | ICD-10-CM

## 2017-05-07 DIAGNOSIS — Z96612 Presence of left artificial shoulder joint: Secondary | ICD-10-CM

## 2017-05-07 DIAGNOSIS — Z452 Encounter for adjustment and management of vascular access device: Secondary | ICD-10-CM

## 2017-05-07 DIAGNOSIS — F419 Anxiety disorder, unspecified: Secondary | ICD-10-CM

## 2017-05-07 DIAGNOSIS — Z932 Ileostomy status: Secondary | ICD-10-CM

## 2017-05-07 DIAGNOSIS — Z7901 Long term (current) use of anticoagulants: Secondary | ICD-10-CM | POA: Insufficient documentation

## 2017-05-07 DIAGNOSIS — Z9049 Acquired absence of other specified parts of digestive tract: Secondary | ICD-10-CM

## 2017-05-07 DIAGNOSIS — Z95828 Presence of other vascular implants and grafts: Secondary | ICD-10-CM

## 2017-05-07 DIAGNOSIS — I129 Hypertensive chronic kidney disease with stage 1 through stage 4 chronic kidney disease, or unspecified chronic kidney disease: Secondary | ICD-10-CM

## 2017-05-07 DIAGNOSIS — N183 Chronic kidney disease, stage 3 (moderate): Secondary | ICD-10-CM | POA: Insufficient documentation

## 2017-05-07 DIAGNOSIS — Z87891 Personal history of nicotine dependence: Secondary | ICD-10-CM

## 2017-05-07 DIAGNOSIS — Z85828 Personal history of other malignant neoplasm of skin: Secondary | ICD-10-CM | POA: Insufficient documentation

## 2017-05-07 DIAGNOSIS — E860 Dehydration: Secondary | ICD-10-CM | POA: Insufficient documentation

## 2017-05-07 DIAGNOSIS — Z0000 Encounter for general adult medical examination without abnormal findings: Secondary | ICD-10-CM | POA: Diagnosis not present

## 2017-05-07 DIAGNOSIS — Z79899 Other long term (current) drug therapy: Secondary | ICD-10-CM | POA: Insufficient documentation

## 2017-05-07 DIAGNOSIS — F329 Major depressive disorder, single episode, unspecified: Secondary | ICD-10-CM | POA: Insufficient documentation

## 2017-05-07 HISTORY — PX: IR US GUIDE VASC ACCESS RIGHT: IMG2390

## 2017-05-07 HISTORY — PX: IR FLUORO GUIDE CV LINE RIGHT: IMG2283

## 2017-05-07 LAB — PROTIME-INR
INR: 1
Prothrombin Time: 13.1 seconds (ref 11.4–15.2)

## 2017-05-07 MED ORDER — HEPARIN SODIUM (PORCINE) 1000 UNIT/ML IJ SOLN
INTRAMUSCULAR | Status: AC
  Filled 2017-05-07: qty 1

## 2017-05-07 MED ORDER — GELATIN ABSORBABLE 12-7 MM EX MISC
CUTANEOUS | Status: AC
  Filled 2017-05-07: qty 1

## 2017-05-07 MED ORDER — LIDOCAINE HCL 1 % IJ SOLN
INTRAMUSCULAR | Status: DC | PRN
  Administered 2017-05-07: 10 mL

## 2017-05-07 MED ORDER — HEPARIN SOD (PORK) LOCK FLUSH 100 UNIT/ML IV SOLN
INTRAVENOUS | Status: AC
  Filled 2017-05-07: qty 5

## 2017-05-07 MED ORDER — LIDOCAINE HCL 1 % IJ SOLN
INTRAMUSCULAR | Status: AC
  Filled 2017-05-07: qty 20

## 2017-05-07 NOTE — Procedures (Signed)
Ileostomy, chronic dehydration  S/p RT IJ TUNNELED SL POWER PICC  TIP SVCRA NO COMP STABLE READY FOR USE FULL REPORT IN PACS

## 2017-05-07 NOTE — ED Triage Notes (Signed)
Pt states he had a PICC line placed in his chest today, his dog was sitting on his lap and pulled on the line. Pt states it felt like the PICC moved, and there was some bleeding. Denies pain.

## 2017-05-07 NOTE — ED Provider Notes (Signed)
[facility] DEPT Provider Note   CSN: [id number] Arrival date & time: 05/07/17  1742     History   Chief Complaint Chief Complaint  Patient presents with  . Vascular Access Problem    HPI Derek [name] is a 76 y.o. male.  HPI   Patient chronically a 76 year old male just discharged from the hospital today after getting a PICC line in his IJ. Patient had his small dog, Junious Dresser on his lap and when she jumped off, her feet got tangled in the cord. He was afraid that she might have pulled the line out.   Appears normal externally.  Past Medical History:  Diagnosis Date  . Anemia   . Anxiety   . Aortic insufficiency    a. mild-mod by echo 09/2015.  . Arthritis    "knees; left shoulder" (09/21/2013)  . Ascending aortic aneurysm (HCC)    a. last measurement 5.3 cm 03/2016 -> f/u planned 09/2015 to continue to follow.  . Atrial fibrillation ([place])   . B12 deficiency    takes Vit 12 shot every 14days   . Cataract   . CKD (chronic kidney disease) stage 3, GFR 30-59 ml/min (HCC) 08/22/2011  . Clotting disorder ([place])   . Crohn's disease ([place])   . Depression   . Enlarged prostate   . Enteric hyperoxaluria 02/21/2016  . GERD (gastroesophageal reflux disease)    takes Omeprazole daily  . Gout    takes Uloric and Colchicine daily  . Heart murmur   . Hepatitis C 1978   negtive RNA load - spontaneously cleared  . Hiatal hernia   . History of blood transfusion 1978; 1990's; ?   "w/bowel resection; S/P allupurinol; ?" (09/21/2013)  . History of colon polyps   . History of kidney stones   . History of MRSA infection 2010  . History of pulmonary embolism 2006   both legs and both lungs /notes 08/26/2008 (09/21/2013)  . History of small bowel obstruction   . History of staph infection 1978  . Hyperoxaluria    Intestinal  . Hypertension   . Insomnia    takes Trazodone nightly  . Internal hemorrhoids   . LV dysfunction    a. h/o EF 45-50% in 2015, normalized on subsequent  echoes.  . Nephrolithiasis   . Pancreatitis 2010   elevated lipase and amylase, stranding in tail of pancreas, ? from Humira  . Pancytopenia    Hx of  . Peripheral neuropathy    takes Gabapentin daily  . Pneumonia    hx of   . Post-traumatic stress syndrome    takes Paxil nightly  . PTSD (post-traumatic stress disorder)   . Pulmonary nodule    a. 46m by CT 05/2015, recommended f/u 6-12 months.  . RLS (restless legs syndrome)   . Rosacea conjunctivitis(372.31)    takes Minocin daily  . Secondary hyperparathyroidism ([place] 02/21/2016  . Sinus bradycardia   . Skin cancer    "cut/burned off left ear and face" (09/21/2013)  . Small bowel obstruction ([place]   . Thrombocytopenia ([place]    hx of    Patient Active Problem List   Diagnosis Date Noted  . Anorexia 02/04/2017  . Aortic valve regurgitation 11/05/2016  . Bowel perforation ([facility] 08/27/2016  . Colonic ischemia ([place] 08/27/2016  . Pain of upper abdomen   . Chronic anticoagulation 08/18/2016  . Sinus bradycardia 08/17/2016  . S/P colon resection 08/14/2016  . Adenomatous polyp of transverse colon s/p partial colectomy 08/14/2016 08/14/2016  .  Infectious diarrhea   . Chronic diarrhea   . Essential hypertension 05/27/2016  . Hypotension 05/27/2016  . Hypomagnesemia 05/27/2016  . Peripheral neuropathic pain 05/26/2016  . GERD (gastroesophageal reflux disease) 05/26/2016  . PAF (paroxysmal atrial fibrillation) ([place]) 05/26/2016  . Lung nodule 05/12/2016  . SBO (small bowel obstruction) ([place]) 05/11/2016  . Enteric hyperoxaluria 02/21/2016  . Secondary hyperparathyroidism ([place]) 02/21/2016  . Pain in joint, shoulder region 10/12/2013  . Decreased range of motion of left shoulder 10/12/2013  . Muscle weakness (generalized) 10/12/2013  . Restriction of joint motion 10/12/2013  . S/P shoulder replacement 09/21/2013  . Nonischemic cardiomyopathy ([place]) 09/07/2013  . Anxiety 08/22/2011  . CKD (chronic kidney disease) stage 3, GFR  30-59 ml/min (HCC) 08/22/2011  . Thrombocytopenia ([place]) 08/22/2011  . CORONARY ATHEROSCLEROSIS NATIVE CORONARY ARTERY 06/25/2010  . Ascending aortic aneurysm ([place]) 06/26/2009  . Crohn's ileocolitis ([place]) 06/26/2009  . B12 DEFICIENCY 05/09/2008  . Gout 05/09/2008  . POST TRAUMATIC STRESS SYNDROME 05/09/2008  . GERD 05/09/2008  . HEPATITIS C Ab positive RNA neg, HX OF 05/09/2008  . PULMONARY EMBOLISM, HX OF 05/09/2008  . GASTRITIS, HX OF 05/09/2008    Past Surgical History:  Procedure Laterality Date  . ANKLE SURGERY Right   . APPENDECTOMY  1978  . BOWEL RESECTION  1978 X 2  . CARDIOVERSION N/A 05/29/2016   Procedure: CARDIOVERSION;  Surgeon: Sanda Klein, MD;  Location: [facility];  Service: Cardiovascular;  Laterality: N/A;  . CHOLECYSTECTOMY    . COLON RESECTION N/A 08/14/2016   Procedure: LAPAROSCOPIC RESECTION TRANSVERSE COLON;  Surgeon: [name], MD;  Location: [facility];  Service: General;  Laterality: N/A;  . COLON SURGERY    . COLONOSCOPY    . ESOPHAGOGASTRODUODENOSCOPY    . EYE SURGERY     cataract surgery bilateral  . FOOT SURGERY Right    "took gout out"  . HEMICOLECTOMY Right   . ILEOCECETOMY  1978   [name] 05/10/2000  (09/21/2013)  . ILEOSTOMY    . INGUINAL HERNIA REPAIR Right   . IR FLUORO GUIDE CV LINE RIGHT  05/07/2017  . IR US GUIDE VASC ACCESS RIGHT  05/07/2017  . KNEE ARTHROSCOPY Left   . LAPAROTOMY N/A 08/27/2016   Procedure: EXPLORATORYLAPAROTOMY, LYSIS OF ADHESIONS, ILEOSTOMY, RIGHT COLECTOMY;  Surgeon: [name], MD;  Location: [facility];  Service: General;  Laterality: N/A;  . LIGAMENT REPAIR Left   . TEE WITHOUT CARDIOVERSION N/A 05/29/2016   Procedure: TRANSESOPHAGEAL ECHOCARDIOGRAM (TEE);  Surgeon: Sanda Klein, MD;  Location: [facility];  Service: Cardiovascular;  Laterality: N/A;  . TOTAL SHOULDER ARTHROPLASTY Left 09/21/2013  . TOTAL SHOULDER ARTHROPLASTY Left 09/21/2013   Procedure: LEFT TOTAL SHOULDER ARTHROPLASTY;  Surgeon: Marin Shutter, MD;  Location: [facility];  Service: Orthopedics;  Laterality: Left;       Home Medications    Prior to Admission medications   Medication Sig Start Date End Date Taking? Authorizing Provider  Acidophilus Lactobacillus CAPS Take 1 capsule by mouth daily. 06/08/16   [name], MD  amiodarone (PACERONE) 200 MG tablet Take 1 tablet (200 mg total) by mouth daily. 11/05/16   End, Harrell Gave, MD  calcium carbonate (TUMS - DOSED IN MG ELEMENTAL CALCIUM) 500 MG chewable tablet Chew 2 tablets by mouth 2 (two) times daily.    [provider]  carboxymethylcellulose (REFRESH PLUS) 0.5 % SOLN Place 1 drop into both eyes 4 (four) times daily.    [provider]  cholecalciferol (VITAMIN D) 1000 units tablet  Take 2,000 Units by mouth daily.     [provider]  cyanocobalamin (,VITAMIN B-12,) 1000 MCG/ML injection Inject 1,000 mcg into the muscle every 14 (fourteen) days.    [provider]  cycloSPORINE (RESTASIS) 0.05 % ophthalmic emulsion Place 1 drop into both eyes 2 (two) times daily.     [provider]  ELIQUIS 5 MG TABS tablet Take 1 tablet (5 mg total) by mouth 2 (two) times daily. 11/05/16   End, Harrell Gave, MD  febuxostat (ULORIC) 40 MG tablet Take 80 mg by mouth daily.    [provider]  gabapentin (NEURONTIN) 100 MG capsule Take 100-200 mg by mouth 2 (two) times daily. Pt takes one capsule in the morning and two at night.    [provider]  HYDROcodone-acetaminophen (NORCO/VICODIN) 5-325 MG tablet Take 1-2 tablets by mouth every 6 (six) hours as needed for severe pain. 01/28/17   Mesner, Corene Cornea, MD  ibuprofen (ADVIL,MOTRIN) 400 MG tablet Take 1 tablet (400 mg total) by mouth every 6 (six) hours as needed for moderate pain. 01/28/17   Mesner, Corene Cornea, MD  loperamide (IMODIUM) 2 MG capsule Take 4 mg by mouth 2 (two) times daily.    [provider]  magnesium oxide (MAGNESIUM-OXIDE) 400 (241.3 Mg) MG tablet Take 800 mg by  mouth 3 (three) times daily.     [provider]  Multiple Vitamin (MULTIVITAMIN WITH MINERALS) TABS tablet Take 1 tablet by mouth daily.    [provider]  omeprazole (PRILOSEC) 20 MG capsule Take 20 mg by mouth daily.      [provider]  ondansetron (ZOFRAN) 4 MG tablet Take 1 tablet (4 mg total) by mouth every 6 (six) hours. 10/31/16   Nat Christen, MD  oxyCODONE-acetaminophen (PERCOCET) 5-325 MG tablet Take 1-2 tablets by mouth every 4 (four) hours as needed. 10/31/16   Nat Christen, MD  PARoxetine (PAXIL) 40 MG tablet Take 20 mg by mouth at bedtime.    [provider]  sodium bicarbonate 650 MG tablet Take 650 mg by mouth 2 (two) times daily.    [provider]  sodium chloride 38.5 mEq/L, potassium chloride 20 mEq/L in dextrose 10 % 1,000 mL Inject 750 mL/hr into the vein continuous.    [provider]  terazosin (HYTRIN) 2 MG capsule Take 2 mg by mouth at bedtime.     [provider]  traZODone (DESYREL) 150 MG tablet Take 75-150 mg by mouth at bedtime as needed for sleep.     [provider]    Family History Family History  Problem Relation Age of Onset  . Kidney disease Father   . Hypertension Father   . Aneurysm Mother   . Aneurysm Sister   . Esophageal cancer Neg Hx   . Stomach cancer Neg Hx   . Rectal cancer Neg Hx     Social History Social History  Substance Use Topics  . Smoking status: Former Smoker    Packs/day: 2.00    Years: 20.00    Types: Cigarettes    Quit date: 08/04/1975  . Smokeless tobacco: Former Systems developer    Types: [place] date: 08/03/1978     Comment: 09/21/2013 "quit smoking in the late 1970's; stopped chewing couple years after I quit smoking"  . Alcohol use No     Allergies   Lorazepam and Humira [adalimumab]   Review of Systems Review of Systems  Constitutional: Negative for activity change.  Respiratory: Negative for shortness  of breath.   Cardiovascular: Negative for  chest pain.  Gastrointestinal: Negative for abdominal pain.     Physical Exam Updated Vital Signs BP 108/72   Pulse 72   Temp 98.4 F (36.9 C) (Oral)   Resp (!) 33   Ht 6' (1.829 m)   Wt 84.8 kg (187 lb)   SpO2 100%   BMI 25.36 kg/m   Physical Exam  Constitutional: He is oriented to person, place, and time. He appears well-nourished.  HENT:  Head: Normocephalic.  Eyes: Conjunctivae are normal.  Cardiovascular: Normal rate.   Pulmonary/Chest: Effort normal.  Line appears in the normal place, no surrounding infection, does not appear pulled, kinked or having excessive amounts out of skin.  Neurological: He is oriented to person, place, and time.  Skin: Skin is warm and dry. He is not diaphoretic.  Psychiatric: He has a normal mood and affect. His behavior is normal.     ED Treatments / Results  Labs (all labs ordered are listed, but only abnormal results are displayed) Labs Reviewed - No data to display  EKG  EKG Interpretation None       Radiology Dg Chest 1 View  Result Date: 05/07/2017 CLINICAL DATA:  PICC placement EXAM: CHEST 1 VIEW COMPARISON:  01/28/2017 chest radiograph. FINDINGS: Right internal jugular central venous catheter terminates in the upper third of the superior vena cava. Partially visualized left shoulder arthroplasty. Stable cardiomediastinal silhouette with top-normal heart size and aortic atherosclerosis. No pneumothorax. No pleural effusion. Low lung volumes. No pulmonary edema. Mild bibasilar scarring versus atelectasis. No acute consolidative airspace disease. IMPRESSION: 1. Right internal jugular central venous catheter terminates in the upper third of the superior vena cava. No pneumothorax. 2. Low lung volumes with mild bibasilar scarring versus atelectasis. Electronically Signed   By: Ilona Sorrel M.D.   On: 05/07/2017 18:38   Ir Fluoro Guide Cv Line Right  Result Date: 05/07/2017 INDICATION: Small bowel ileostomy, chronic dehydration,  access for IV hydration EXAM: ULTRASOUND AND FLUOROSCOPIC GUIDED RIGHT IJ TUNNELED SINGLE-LUMEN POWER PICC LINE INSERTION MEDICATIONS: 1% lidocaine local CONTRAST:  None FLUOROSCOPY TIME:  18 seconds (2 mGy) COMPLICATIONS: None immediate. TECHNIQUE: The procedure, risks, benefits, and alternatives were explained to the patient and informed written consent was obtained. A timeout was performed prior to the initiation of the procedure. The right neck and chest were prepped with chlorhexidine in a sterile fashion, and a sterile drape was applied covering the operative field. Maximum barrier sterile technique with sterile gowns and gloves were used for the procedure. A timeout was performed prior to the initiation of the procedure. Local anesthesia was provided with 1% lidocaine. Under direct ultrasound guidance, the right internal jugular vein was accessed with a micropuncture kit after the overlying soft tissues were anesthetized with 1% lidocaine. An ultrasound image was saved for documentation purposes. A guidewire was advanced to the level of the superior caval-atrial junction for measurement purposes and the PICC line was cut to length. In the right infraclavicular chest, the PICC line was tunneled subcutaneously to the venotomy site. A peel-away sheath was placed and a 24 cm, 5 French, single-lumen tunneled power PICC linewas inserted to level of the superior caval-atrial junction. A post procedure spot fluoroscopic was obtained. The catheter easily aspirated and flushed and was sutured in place. Catheter secured with Ethilon suture. Venotomy site closed with derma bond. A dressing was placed. The patient tolerated the procedure well without immediate post procedural complication. FINDINGS: After catheter placement, the tip  lies within the superior cavoatrial junction. The catheter aspirates and flushes normally and is ready for immediate use. IMPRESSION: Successful ultrasound and fluoroscopic guided placement of  a right internal jugular vein approach, 24 cm, 5 French, single-lumen tunneled powerPICC with tip at the superior caval-atrial junction. The PICC line is ready for immediate use. Electronically Signed   By: Jerilynn Mages.  Shick M.D.   On: 05/07/2017 12:46   Ir US Guide Vasc Access Right  Result Date: 05/07/2017 INDICATION: Small bowel ileostomy, chronic dehydration, access for IV hydration EXAM: ULTRASOUND AND FLUOROSCOPIC GUIDED RIGHT IJ TUNNELED SINGLE-LUMEN POWER PICC LINE INSERTION MEDICATIONS: 1% lidocaine local CONTRAST:  None FLUOROSCOPY TIME:  18 seconds (2 mGy) COMPLICATIONS: None immediate. TECHNIQUE: The procedure, risks, benefits, and alternatives were explained to the patient and informed written consent was obtained. A timeout was performed prior to the initiation of the procedure. The right neck and chest were prepped with chlorhexidine in a sterile fashion, and a sterile drape was applied covering the operative field. Maximum barrier sterile technique with sterile gowns and gloves were used for the procedure. A timeout was performed prior to the initiation of the procedure. Local anesthesia was provided with 1% lidocaine. Under direct ultrasound guidance, the right internal jugular vein was accessed with a micropuncture kit after the overlying soft tissues were anesthetized with 1% lidocaine. An ultrasound image was saved for documentation purposes. A guidewire was advanced to the level of the superior caval-atrial junction for measurement purposes and the PICC line was cut to length. In the right infraclavicular chest, the PICC line was tunneled subcutaneously to the venotomy site. A peel-away sheath was placed and a 24 cm, 5 French, single-lumen tunneled power PICC linewas inserted to level of the superior caval-atrial junction. A post procedure spot fluoroscopic was obtained. The catheter easily aspirated and flushed and was sutured in place. Catheter secured with Ethilon suture. Venotomy site closed  with derma bond. A dressing was placed. The patient tolerated the procedure well without immediate post procedural complication. FINDINGS: After catheter placement, the tip lies within the superior cavoatrial junction. The catheter aspirates and flushes normally and is ready for immediate use. IMPRESSION: Successful ultrasound and fluoroscopic guided placement of a right internal jugular vein approach, 24 cm, 5 French, single-lumen tunneled powerPICC with tip at the superior caval-atrial junction. The PICC line is ready for immediate use. Electronically Signed   By: Jerilynn Mages.  Shick M.D.   On: 05/07/2017 12:46    Procedures Procedures (including critical care time)  Medications Ordered in ED Medications - No data to display   Initial Impression / Assessment and Plan / ED Course  I have reviewed the triage vital signs and the nursing notes.  Pertinent labs & imaging results that were available during my care of the patient were reviewed by me and considered in my medical decision making (see chart for details).     Patient chronically a 76 year old male just discharged from the hospital today after getting a PICC line in his IJ. Patient had his small dog, Junious Dresser on his lap and when s x-ray confirms he jumped off, her feet got tangled in the cord. He was afraid that she might have pulled the line out.   Appears normal externally  9:20 PM   X-ray confirmed correct placement. Flushed easily and pullback blood. We'll discharge home and follow-up with planned outpatient.   Final Clinical Impressions(s) / ED Diagnoses   Final diagnoses:  None    New Prescriptions New Prescriptions  No medications on file     Macarthur Critchley, MD 05/07/17 2120

## 2017-05-08 DIAGNOSIS — N183 Chronic kidney disease, stage 3 (moderate): Secondary | ICD-10-CM | POA: Diagnosis not present

## 2017-05-08 DIAGNOSIS — F4310 Post-traumatic stress disorder, unspecified: Secondary | ICD-10-CM | POA: Diagnosis not present

## 2017-05-08 DIAGNOSIS — I129 Hypertensive chronic kidney disease with stage 1 through stage 4 chronic kidney disease, or unspecified chronic kidney disease: Secondary | ICD-10-CM | POA: Diagnosis not present

## 2017-05-08 DIAGNOSIS — Z452 Encounter for adjustment and management of vascular access device: Secondary | ICD-10-CM | POA: Diagnosis not present

## 2017-05-08 DIAGNOSIS — K5090 Crohn's disease, unspecified, without complications: Secondary | ICD-10-CM | POA: Diagnosis not present

## 2017-05-08 DIAGNOSIS — Z432 Encounter for attention to ileostomy: Secondary | ICD-10-CM | POA: Diagnosis not present

## 2017-05-09 ENCOUNTER — Inpatient Hospital Stay (HOSPITAL_COMMUNITY)
Admission: EM | Admit: 2017-05-09 | Discharge: 2017-05-13 | DRG: 389 | Disposition: A | Discharge status: Home Health | Payer: Medicare Other | Attending: Internal Medicine | Admitting: Internal Medicine

## 2017-05-09 ENCOUNTER — Encounter (HOSPITAL_COMMUNITY): Payer: Self-pay | Admitting: Emergency Medicine

## 2017-05-09 ENCOUNTER — Emergency Department (HOSPITAL_COMMUNITY): Payer: Medicare Other

## 2017-05-09 DIAGNOSIS — K5090 Crohn's disease, unspecified, without complications: Secondary | ICD-10-CM | POA: Diagnosis present

## 2017-05-09 DIAGNOSIS — E860 Dehydration: Secondary | ICD-10-CM | POA: Diagnosis present

## 2017-05-09 DIAGNOSIS — Z79899 Other long term (current) drug therapy: Secondary | ICD-10-CM | POA: Diagnosis not present

## 2017-05-09 DIAGNOSIS — N400 Enlarged prostate without lower urinary tract symptoms: Secondary | ICD-10-CM | POA: Diagnosis present

## 2017-05-09 DIAGNOSIS — Z86711 Personal history of pulmonary embolism: Secondary | ICD-10-CM | POA: Diagnosis not present

## 2017-05-09 DIAGNOSIS — I712 Thoracic aortic aneurysm, without rupture: Secondary | ICD-10-CM | POA: Diagnosis present

## 2017-05-09 DIAGNOSIS — G629 Polyneuropathy, unspecified: Secondary | ICD-10-CM | POA: Diagnosis present

## 2017-05-09 DIAGNOSIS — M109 Gout, unspecified: Secondary | ICD-10-CM | POA: Diagnosis present

## 2017-05-09 DIAGNOSIS — I480 Paroxysmal atrial fibrillation: Secondary | ICD-10-CM | POA: Diagnosis not present

## 2017-05-09 DIAGNOSIS — K56600 Partial intestinal obstruction, unspecified as to cause: Secondary | ICD-10-CM | POA: Diagnosis present

## 2017-05-09 DIAGNOSIS — Z7901 Long term (current) use of anticoagulants: Secondary | ICD-10-CM

## 2017-05-09 DIAGNOSIS — N2581 Secondary hyperparathyroidism of renal origin: Secondary | ICD-10-CM | POA: Diagnosis present

## 2017-05-09 DIAGNOSIS — K219 Gastro-esophageal reflux disease without esophagitis: Secondary | ICD-10-CM | POA: Diagnosis present

## 2017-05-09 DIAGNOSIS — Z85038 Personal history of other malignant neoplasm of large intestine: Secondary | ICD-10-CM | POA: Diagnosis not present

## 2017-05-09 DIAGNOSIS — N183 Chronic kidney disease, stage 3 (moderate): Secondary | ICD-10-CM | POA: Diagnosis present

## 2017-05-09 DIAGNOSIS — Z85828 Personal history of other malignant neoplasm of skin: Secondary | ICD-10-CM

## 2017-05-09 DIAGNOSIS — F329 Major depressive disorder, single episode, unspecified: Secondary | ICD-10-CM | POA: Diagnosis present

## 2017-05-09 DIAGNOSIS — F4310 Post-traumatic stress disorder, unspecified: Secondary | ICD-10-CM | POA: Diagnosis present

## 2017-05-09 DIAGNOSIS — Z87891 Personal history of nicotine dependence: Secondary | ICD-10-CM | POA: Diagnosis not present

## 2017-05-09 DIAGNOSIS — I10 Essential (primary) hypertension: Secondary | ICD-10-CM | POA: Diagnosis not present

## 2017-05-09 DIAGNOSIS — E872 Acidosis: Secondary | ICD-10-CM | POA: Diagnosis present

## 2017-05-09 DIAGNOSIS — Z932 Ileostomy status: Secondary | ICD-10-CM | POA: Diagnosis not present

## 2017-05-09 DIAGNOSIS — I129 Hypertensive chronic kidney disease with stage 1 through stage 4 chronic kidney disease, or unspecified chronic kidney disease: Secondary | ICD-10-CM | POA: Diagnosis present

## 2017-05-09 DIAGNOSIS — E538 Deficiency of other specified B group vitamins: Secondary | ICD-10-CM | POA: Diagnosis present

## 2017-05-09 DIAGNOSIS — R7881 Bacteremia: Secondary | ICD-10-CM | POA: Diagnosis present

## 2017-05-09 DIAGNOSIS — K5651 Intestinal adhesions [bands], with partial obstruction: Secondary | ICD-10-CM | POA: Diagnosis present

## 2017-05-09 DIAGNOSIS — R109 Unspecified abdominal pain: Secondary | ICD-10-CM | POA: Diagnosis not present

## 2017-05-09 DIAGNOSIS — G2581 Restless legs syndrome: Secondary | ICD-10-CM | POA: Diagnosis present

## 2017-05-09 DIAGNOSIS — Z86718 Personal history of other venous thrombosis and embolism: Secondary | ICD-10-CM

## 2017-05-09 DIAGNOSIS — Z888 Allergy status to other drugs, medicaments and biological substances status: Secondary | ICD-10-CM

## 2017-05-09 DIAGNOSIS — Z9049 Acquired absence of other specified parts of digestive tract: Secondary | ICD-10-CM

## 2017-05-09 LAB — COMPREHENSIVE METABOLIC PANEL
ALBUMIN: 3.4 g/dL — AB (ref 3.5–5.0)
ALT: 16 U/L — ABNORMAL LOW (ref 17–63)
ANION GAP: 10 (ref 5–15)
AST: 29 U/L (ref 15–41)
Alkaline Phosphatase: 207 U/L — ABNORMAL HIGH (ref 38–126)
BILIRUBIN TOTAL: 0.8 mg/dL (ref 0.3–1.2)
BUN: 12 mg/dL (ref 6–20)
CO2: 26 mmol/L (ref 22–32)
Calcium: 8.9 mg/dL (ref 8.9–10.3)
Chloride: 107 mmol/L (ref 101–111)
Creatinine, Ser: 1.55 mg/dL — ABNORMAL HIGH (ref 0.61–1.24)
GFR calc Af Amer: 49 mL/min — ABNORMAL LOW (ref >60)
GFR calc non Af Amer: 42 mL/min — ABNORMAL LOW (ref >60)
GLUCOSE: 127 mg/dL — AB (ref 65–99)
POTASSIUM: 4.1 mmol/L (ref 3.5–5.1)
SODIUM: 143 mmol/L (ref 135–145)
Total Protein: 6.4 g/dL — ABNORMAL LOW (ref 6.5–8.1)

## 2017-05-09 LAB — CBC WITH DIFFERENTIAL/PLATELET
BASOS PCT: 0 %
Basophils Absolute: 0 10*3/uL (ref 0.0–0.1)
Eosinophils Absolute: 0.1 10*3/uL (ref 0.0–0.7)
Eosinophils Relative: 3 %
HEMATOCRIT: 36.2 % — AB (ref 39.0–52.0)
Hemoglobin: 11.8 g/dL — ABNORMAL LOW (ref 13.0–17.0)
Lymphocytes Relative: 17 %
Lymphs Abs: 0.7 10*3/uL (ref 0.7–4.0)
MCH: 27.9 pg (ref 26.0–34.0)
MCHC: 32.6 g/dL (ref 30.0–36.0)
MCV: 85.6 fL (ref 78.0–100.0)
MONO ABS: 0.3 10*3/uL (ref 0.1–1.0)
MONOS PCT: 6 %
NEUTROS ABS: 3.2 10*3/uL (ref 1.7–7.7)
Neutrophils Relative %: 74 %
Platelets: 107 10*3/uL — ABNORMAL LOW (ref 150–400)
RBC: 4.23 MIL/uL (ref 4.22–5.81)
RDW: 16.5 % — AB (ref 11.5–15.5)
WBC: 4.2 10*3/uL (ref 4.0–10.5)

## 2017-05-09 LAB — LIPASE, BLOOD: Lipase: 35 U/L (ref 11–51)

## 2017-05-09 MED ORDER — DEXTROSE-NACL 5-0.45 % IV SOLN
INTRAVENOUS | Status: DC
  Administered 2017-05-09 – 2017-05-12 (×6): via INTRAVENOUS

## 2017-05-09 MED ORDER — ONDANSETRON HCL 4 MG/2ML IJ SOLN: 4.0000 mg | Freq: Four times a day (QID) | INTRAMUSCULAR | Status: DC | PRN

## 2017-05-09 MED ORDER — HEPARIN SODIUM (PORCINE) 5000 UNIT/ML IJ SOLN: 5000.0000 [IU] | Freq: Three times a day (TID) | INTRAMUSCULAR | Status: DC

## 2017-05-09 MED ORDER — POLYVINYL ALCOHOL 1.4 % OP SOLN
1.0000 [drp] | Freq: Four times a day (QID) | OPHTHALMIC | Status: DC
  Administered 2017-05-09 – 2017-05-13 (×16): 1 [drp] via OPHTHALMIC
  Filled 2017-05-09: qty 15

## 2017-05-09 MED ORDER — MORPHINE SULFATE (PF) 4 MG/ML IV SOLN
2.0000 mg | INTRAVENOUS | Status: DC | PRN
  Administered 2017-05-09: 2 mg via INTRAVENOUS
  Filled 2017-05-09: qty 1

## 2017-05-09 MED ORDER — PAROXETINE HCL 20 MG PO TABS
20.0000 mg | ORAL_TABLET | Freq: Every day | ORAL | Status: DC
  Administered 2017-05-09 – 2017-05-12 (×4): 20 mg via ORAL
  Filled 2017-05-09 (×4): qty 1

## 2017-05-09 MED ORDER — FEBUXOSTAT 40 MG PO TABS: 80.0000 mg | ORAL_TABLET | Freq: Every day | ORAL | Status: DC

## 2017-05-09 MED ORDER — CALCIUM CARBONATE ANTACID 500 MG PO CHEW
2.0000 | CHEWABLE_TABLET | Freq: Two times a day (BID) | ORAL | Status: DC
  Administered 2017-05-09 – 2017-05-13 (×9): 400 mg via ORAL
  Filled 2017-05-09 (×9): qty 2

## 2017-05-09 MED ORDER — CARBOXYMETHYLCELLULOSE SODIUM 0.5 % OP SOLN: 1.0000 [drp] | Freq: Four times a day (QID) | OPHTHALMIC | Status: DC

## 2017-05-09 MED ORDER — TERAZOSIN HCL 2 MG PO CAPS
2.0000 mg | ORAL_CAPSULE | Freq: Every day | ORAL | Status: DC
  Administered 2017-05-09 – 2017-05-12 (×4): 2 mg via ORAL
  Filled 2017-05-09 (×4): qty 1

## 2017-05-09 MED ORDER — AMIODARONE HCL 200 MG PO TABS
200.0000 mg | ORAL_TABLET | Freq: Every day | ORAL | Status: DC
  Administered 2017-05-09 – 2017-05-13 (×5): 200 mg via ORAL
  Filled 2017-05-09 (×5): qty 1

## 2017-05-09 MED ORDER — CIPROFLOXACIN IN D5W 400 MG/200ML IV SOLN
400.0000 mg | Freq: Once | INTRAVENOUS | Status: AC
  Administered 2017-05-09: 400 mg via INTRAVENOUS
  Filled 2017-05-09: qty 200

## 2017-05-09 MED ORDER — CIPROFLOXACIN IN D5W 400 MG/200ML IV SOLN
400.0000 mg | Freq: Two times a day (BID) | INTRAVENOUS | Status: DC
  Administered 2017-05-09 – 2017-05-10 (×2): 400 mg via INTRAVENOUS
  Filled 2017-05-09 (×2): qty 200

## 2017-05-09 MED ORDER — TRAZODONE HCL 50 MG PO TABS
100.0000 mg | ORAL_TABLET | Freq: Every day | ORAL | Status: DC
  Administered 2017-05-09 – 2017-05-12 (×4): 100 mg via ORAL
  Filled 2017-05-09 (×4): qty 2

## 2017-05-09 MED ORDER — IOPAMIDOL (ISOVUE-300) INJECTION 61%
INTRAVENOUS | Status: AC
  Filled 2017-05-09: qty 100

## 2017-05-09 MED ORDER — MORPHINE SULFATE (PF) 4 MG/ML IV SOLN
4.0000 mg | Freq: Once | INTRAVENOUS | Status: AC
  Administered 2017-05-09: 4 mg via INTRAVENOUS
  Filled 2017-05-09: qty 1

## 2017-05-09 MED ORDER — PANTOPRAZOLE SODIUM 40 MG IV SOLR
40.0000 mg | INTRAVENOUS | Status: DC
  Administered 2017-05-09 – 2017-05-10 (×2): 40 mg via INTRAVENOUS
  Filled 2017-05-09 (×2): qty 40

## 2017-05-09 MED ORDER — IOPAMIDOL (ISOVUE-300) INJECTION 61%: 80.0000 mL | Freq: Once | INTRAVENOUS | Status: DC | PRN

## 2017-05-09 MED ORDER — SODIUM CHLORIDE 0.9 % IV BOLUS (SEPSIS)
1000.0000 mL | Freq: Once | INTRAVENOUS | Status: AC
  Administered 2017-05-09: 1000 mL via INTRAVENOUS

## 2017-05-09 MED ORDER — CYCLOSPORINE 0.05 % OP EMUL
1.0000 [drp] | Freq: Two times a day (BID) | OPHTHALMIC | Status: DC
  Administered 2017-05-09 – 2017-05-13 (×9): 1 [drp] via OPHTHALMIC
  Filled 2017-05-09 (×10): qty 1

## 2017-05-09 MED ORDER — HYDROMORPHONE HCL-NACL 0.5-0.9 MG/ML-% IV SOSY
0.5000 mg | PREFILLED_SYRINGE | INTRAVENOUS | Status: DC | PRN
  Administered 2017-05-09 – 2017-05-10 (×5): 0.5 mg via INTRAVENOUS
  Filled 2017-05-09 (×5): qty 1

## 2017-05-09 MED ORDER — MORPHINE SULFATE (PF) 2 MG/ML IV SOLN: 2.0000 mg | INTRAVENOUS | Status: DC | PRN

## 2017-05-09 MED ORDER — ONDANSETRON HCL 4 MG/2ML IJ SOLN
4.0000 mg | Freq: Once | INTRAMUSCULAR | Status: AC
  Administered 2017-05-09: 4 mg via INTRAVENOUS
  Filled 2017-05-09: qty 2

## 2017-05-09 MED ORDER — SODIUM BICARBONATE 650 MG PO TABS: 650.0000 mg | ORAL_TABLET | Freq: Two times a day (BID) | ORAL | Status: DC

## 2017-05-09 MED ORDER — APIXABAN 5 MG PO TABS
5.0000 mg | ORAL_TABLET | Freq: Two times a day (BID) | ORAL | Status: DC
  Administered 2017-05-09 – 2017-05-13 (×9): 5 mg via ORAL
  Filled 2017-05-09 (×9): qty 1

## 2017-05-09 MED ORDER — METRONIDAZOLE IN NACL 5-0.79 MG/ML-% IV SOLN
500.0000 mg | Freq: Three times a day (TID) | INTRAVENOUS | Status: DC
  Administered 2017-05-09 – 2017-05-10 (×5): 500 mg via INTRAVENOUS
  Filled 2017-05-09 (×5): qty 100

## 2017-05-09 MED ORDER — SODIUM BICARBONATE 650 MG PO TABS
1300.0000 mg | ORAL_TABLET | Freq: Two times a day (BID) | ORAL | Status: DC
  Administered 2017-05-09 – 2017-05-13 (×9): 1300 mg via ORAL
  Filled 2017-05-09 (×9): qty 2

## 2017-05-09 NOTE — ED Notes (Signed)
CALL REPORT TO ASHLEY @ [phone]

## 2017-05-09 NOTE — Consult Note (Signed)
Surgical Consultation Requesting provider: Dr. Dreama Saa  CC: abdominal pain  HPI: very nice 76yo man known to our service with prior small bowel obstructions, multiple abdominal surgeries, and anticipating ostomy reversal with Dr. [name] on November 1. He ate a box of cracker jacks yesterday morning and afterwards began to experience abdominal pain and bloating similar to his prior symptoms of SBO. He has continued to have ostomy output but the amount has been decreased per his wife at the bedside. No nausea or emesis at this point.   Allergies  Allergen Reactions  . Lorazepam Other (See Comments)    Reaction:  Hallucinations   . Humira [Adalimumab] Other (See Comments)    Pt states that he got pancreatitis.      Past Medical History:  Diagnosis Date  . Anemia   . Anxiety   . Aortic insufficiency    a. mild-mod by echo 09/2015.  . Arthritis    "knees; left shoulder" (09/21/2013)  . Ascending aortic aneurysm (HCC)    a. last measurement 5.3 cm 03/2016 -> f/u planned 09/2015 to continue to follow.  . Atrial fibrillation ([place])   . B12 deficiency    takes Vit 12 shot every 14days   . Cataract   . CKD (chronic kidney disease) stage 3, GFR 30-59 ml/min (HCC) 08/22/2011  . Clotting disorder ([place])   . Crohn's disease ([place])   . Depression   . Enlarged prostate   . Enteric hyperoxaluria 02/21/2016  . GERD (gastroesophageal reflux disease)    takes Omeprazole daily  . Gout    takes Uloric and Colchicine daily  . Heart murmur   . Hepatitis C 1978   negtive RNA load - spontaneously cleared  . Hiatal hernia   . History of blood transfusion 1978; 1990's; ?   "w/bowel resection; S/P allupurinol; ?" (09/21/2013)  . History of colon polyps   . History of kidney stones   . History of MRSA infection 2010  . History of pulmonary embolism 2006   both legs and both lungs /notes 08/26/2008 (09/21/2013)  . History of small bowel obstruction   . History of staph infection 1978  . Hyperoxaluria     Intestinal  . Hypertension   . Insomnia    takes Trazodone nightly  . Internal hemorrhoids   . LV dysfunction    a. h/o EF 45-50% in 2015, normalized on subsequent echoes.  . Nephrolithiasis   . Pancreatitis 2010   elevated lipase and amylase, stranding in tail of pancreas, ? from Humira  . Pancytopenia    Hx of  . Peripheral neuropathy    takes Gabapentin daily  . Pneumonia    hx of   . Post-traumatic stress syndrome    takes Paxil nightly  . PTSD (post-traumatic stress disorder)   . Pulmonary nodule    a. 53m by CT 05/2015, recommended f/u 6-12 months.  . RLS (restless legs syndrome)   . Rosacea conjunctivitis(372.31)    takes Minocin daily  . Secondary hyperparathyroidism ([place] 02/21/2016  . Sinus bradycardia   . Skin cancer    "cut/burned off left ear and face" (09/21/2013)  . Small bowel obstruction ([place]   . Thrombocytopenia ([place]    hx of    Past Surgical History:  Procedure Laterality Date  . ANKLE SURGERY Right   . APPENDECTOMY  1978  . BOWEL RESECTION  1978 X 2  . CARDIOVERSION N/A 05/29/2016   Procedure: CARDIOVERSION;  Surgeon: MSanda Klein MD;  [facility]  Service:  Cardiovascular;  Laterality: N/A;  . CHOLECYSTECTOMY    . COLON RESECTION N/A 08/14/2016   Procedure: LAPAROSCOPIC RESECTION TRANSVERSE COLON;  Surgeon: [name], MD;  Location: [facility];  Service: General;  Laterality: N/A;  . COLON SURGERY    . COLONOSCOPY    . ESOPHAGOGASTRODUODENOSCOPY    . EYE SURGERY     cataract surgery bilateral  . FOOT SURGERY Right    "took gout out"  . HEMICOLECTOMY Right   . ILEOCECETOMY  1978   [name] 05/10/2000  (09/21/2013)  . ILEOSTOMY    . INGUINAL HERNIA REPAIR Right   . IR FLUORO GUIDE CV LINE RIGHT  05/07/2017  . IR US GUIDE VASC ACCESS RIGHT  05/07/2017  . KNEE ARTHROSCOPY Left   . LAPAROTOMY N/A 08/27/2016   Procedure: EXPLORATORYLAPAROTOMY, LYSIS OF ADHESIONS, ILEOSTOMY, RIGHT COLECTOMY;  Surgeon: [name], MD;  Location: [facility];   Service: General;  Laterality: N/A;  . LIGAMENT REPAIR Left   . TEE WITHOUT CARDIOVERSION N/A 05/29/2016   Procedure: TRANSESOPHAGEAL ECHOCARDIOGRAM (TEE);  Surgeon: Sanda Klein, MD;  Location: [facility];  Service: Cardiovascular;  Laterality: N/A;  . TOTAL SHOULDER ARTHROPLASTY Left 09/21/2013  . TOTAL SHOULDER ARTHROPLASTY Left 09/21/2013   Procedure: LEFT TOTAL SHOULDER ARTHROPLASTY;  Surgeon: Marin Shutter, MD;  Location: [facility];  Service: Orthopedics;  Laterality: Left;    Family History  Problem Relation Age of Onset  . Kidney disease Father   . Hypertension Father   . Aneurysm Mother   . Aneurysm Sister   . Esophageal cancer Neg Hx   . Stomach cancer Neg Hx   . Rectal cancer Neg Hx     Social History   Social History  . Marital status: Married    Spouse name: N/A  . Number of children: 2  . Years of education: N/A   Occupational History  . Retired    Social History Main Topics  . Smoking status: Former Smoker    Packs/day: 2.00    Years: 20.00    Types: Cigarettes    Quit date: 08/04/1975  . Smokeless tobacco: Former Systems developer    Types: [place] date: 08/03/1978     Comment: 09/21/2013 "quit smoking in the late 1970's; stopped chewing couple years after I quit smoking"  . Alcohol use No  . Drug use: No  . Sexual activity: Not Currently   Other Topics Concern  . None   Social History Narrative   Married 2 children and 6 grandchildren all local   [place] [employer]   Daily caffeine   Does not exercise regularly    No current facility-administered medications on file prior to encounter.    Current Outpatient Prescriptions on File Prior to Encounter  Medication Sig Dispense Refill  . Acidophilus Lactobacillus CAPS Take 1 capsule by mouth daily. 90 capsule 3  . amiodarone (PACERONE) 200 MG tablet Take 1 tablet (200 mg total) by mouth daily. 90 tablet 3  . calcium carbonate (TUMS - DOSED IN MG ELEMENTAL CALCIUM) 500 MG chewable tablet Chew 2 tablets by mouth 2  (two) times daily.    . carboxymethylcellulose (REFRESH PLUS) 0.5 % SOLN Place 1 drop into both eyes 4 (four) times daily.    . cholecalciferol (VITAMIN D) 1000 units tablet Take 2,000 Units by mouth daily.     . cyanocobalamin (,VITAMIN B-12,) 1000 MCG/ML injection Inject 1,000 mcg into the muscle every 14 (fourteen) days.    . cycloSPORINE (RESTASIS) 0.05 % ophthalmic emulsion  Place 1 drop into both eyes 2 (two) times daily.     Marland Kitchen ELIQUIS 5 MG TABS tablet Take 1 tablet (5 mg total) by mouth 2 (two) times daily. 180 tablet 3  . febuxostat (ULORIC) 40 MG tablet Take 80 mg by mouth daily.    Marland Kitchen gabapentin (NEURONTIN) 100 MG capsule Take 100-200 mg by mouth 2 (two) times daily. Pt takes one capsule in the morning and two at night.    Marland Kitchen HYDROcodone-acetaminophen (NORCO/VICODIN) 5-325 MG tablet Take 1-2 tablets by mouth every 6 (six) hours as needed for severe pain. 10 tablet 0  . ibuprofen (ADVIL,MOTRIN) 400 MG tablet Take 1 tablet (400 mg total) by mouth every 6 (six) hours as needed for moderate pain. 30 tablet 0  . loperamide (IMODIUM) 2 MG capsule Take 4 mg by mouth 2 (two) times daily.    . magnesium oxide (MAGNESIUM-OXIDE) 400 (241.3 Mg) MG tablet Take 800 mg by mouth 3 (three) times daily.     . Multiple Vitamin (MULTIVITAMIN WITH MINERALS) TABS tablet Take 1 tablet by mouth daily.    Marland Kitchen omeprazole (PRILOSEC) 20 MG capsule Take 20 mg by mouth daily.      . ondansetron (ZOFRAN) 4 MG tablet Take 1 tablet (4 mg total) by mouth every 6 (six) hours. 12 tablet 0  . oxyCODONE-acetaminophen (PERCOCET) 5-325 MG tablet Take 1-2 tablets by mouth every 4 (four) hours as needed. 20 tablet 0  . PARoxetine (PAXIL) 40 MG tablet Take 20 mg by mouth at bedtime.    . sodium bicarbonate 650 MG tablet Take 650 mg by mouth 2 (two) times daily.    . sodium chloride 38.5 mEq/L, potassium chloride 20 mEq/L in dextrose 10 % 1,000 mL Inject 750 mL/hr into the vein continuous.    Marland Kitchen terazosin (HYTRIN) 2 MG capsule Take 2 mg  by mouth at bedtime.     . traZODone (DESYREL) 150 MG tablet Take 75-150 mg by mouth at bedtime as needed for sleep.       Review of Systems: a complete, 10pt review of systems was completed with pertinent positives and negatives as documented in the HPI  Physical Exam: Vitals:   05/09/17 0519 05/09/17 0630  BP:  106/66  Pulse:  (!) 32  Resp:  19  SpO2: 95% 92%   Gen: A&Ox3, no distress  Head: normocephalic, atraumatic, EOMI, anicteric.  Neck: supple without mass or thyromegaly Chest: unlabored respirations, symmetrical air entry   Cardiovascular: RRR with palpable distal pulses, no pedal edema Abdomen: RLQ stoma with stool and liquid in bag. Abdomen is distended, mildly tender in right hemiabdomen. No peritonitis. No mass or organomegaly.  Extremities: warm, without edema, no deformities  Neuro: grossly intact Psych: appropriate mood and affect  Skin: warm and dry   CBC Latest Ref Rng & Units 05/09/2017 01/28/2017 10/31/2016  WBC 4.0 - 10.5 K/uL 4.2 5.0 3.7(L)  Hemoglobin 13.0 - 17.0 g/dL 11.8(L) 12.4(L) 10.2(L)  Hematocrit 39.0 - 52.0 % 36.2(L) 37.0(L) 32.3(L)  Platelets 150 - 400 K/uL 107(L) 117(L) 145(L)    CMP Latest Ref Rng & Units 05/09/2017 04/12/2017 04/05/2017  Glucose 65 - 99 mg/dL 127(H) 110(H) 95  BUN 6 - 20 mg/dL _0 Creatinine 0.61 - 1.24 mg/dL 1.55(H) 1.48(H) 1.66(H)  Sodium 135 - 145 mmol/L 143 137 137  Potassium 3.5 - 5.1 mmol/L 4.1 3.9 3.9  Chloride 101 - 111 mmol/L 107 104 100(L)  CO2 22 - 32 mmol/L _1 Calcium  8.9 - 10.3 mg/dL 8.9 8.0(L) 8.8(L)  Total Protein 6.5 - 8.1 g/dL 6.4(L) - -  Total Bilirubin 0.3 - 1.2 mg/dL 0.8 - -  Alkaline Phos 38 - 126 U/L 207(H) - -  AST 15 - 41 U/L 29 - -  ALT 17 - 63 U/L 16(L) - -    Lab Results  Component Value Date   INR 1.00 05/07/2017   INR 1.03 05/26/2016   INR 1.01 05/26/2016   PROTIME 16.2 (H) 01/18/2006   PROTIME 18.2 (H) 01/15/2006   PROTIME 11.5 01/11/2006    Imaging: Ct Abdomen Pelvis Wo  Contrast  Result Date: 05/09/2017 CLINICAL DATA:  Mid abdominal pain with history of bowel obstruction and ischemic bowel with osteotomy. Assess for recurrent obstruction. EXAM: CT ABDOMEN AND PELVIS WITHOUT CONTRAST TECHNIQUE: Multidetector CT imaging of the abdomen and pelvis was performed following the standard protocol without IV contrast. COMPARISON:  03/08/2017 FINDINGS: Lower chest: Mild scarring. No active process. No pleural or pericardial fluid. Hepatobiliary: Liver parenchyma is normal. Previous cholecystectomy. Ductal system within normal limits given previous cholecystectomy. Pancreas: Normal Spleen: Persistent moderate splenomegaly with length of 17 cm. No focal lesion. Adrenals/Urinary Tract: Adrenal glands are normal. Bilateral small nonobstructing stones, right more numerous than left. Small renal cysts. No hydronephrosis. Renal atrophy. Stomach/Bowel: Previous colectomy with ostomy to the right of midline. Residual left colon is collapsed. This does contain some previously administered contrast. There dilated loops of small intestine consistent with partial small bowel obstruction. Vascular/Lymphatic: Aortic atherosclerosis. No aneurysm. IVC is normal. No retroperitoneal adenopathy. Reproductive: Normal Other: No free fluid or air. Musculoskeletal: Ordinary lumbar degenerative changes. IMPRESSION: Previous bowel resection with right para median ostomy. Partial small bowel obstruction with dilated residual small intestine. No free fluid or air. Chronic splenomegaly. Chronic renal atrophy with nonobstructing small calculi and small cysts. Aortic atherosclerosis. Electronically Signed   By: Nelson Chimes M.D.   On: 05/09/2017 07:17   Dg Chest 1 View  Result Date: 05/07/2017 CLINICAL DATA:  PICC placement EXAM: CHEST 1 VIEW COMPARISON:  01/28/2017 chest radiograph. FINDINGS: Right internal jugular central venous catheter terminates in the upper third of the superior vena cava. Partially visualized  left shoulder arthroplasty. Stable cardiomediastinal silhouette with top-normal heart size and aortic atherosclerosis. No pneumothorax. No pleural effusion. Low lung volumes. No pulmonary edema. Mild bibasilar scarring versus atelectasis. No acute consolidative airspace disease. IMPRESSION: 1. Right internal jugular central venous catheter terminates in the upper third of the superior vena cava. No pneumothorax. 2. Low lung volumes with mild bibasilar scarring versus atelectasis. Electronically Signed   By: Ilona Sorrel M.D.   On: 05/07/2017 18:38   Ir Fluoro Guide Cv Line Right  Result Date: 05/07/2017 INDICATION: Small bowel ileostomy, chronic dehydration, access for IV hydration EXAM: ULTRASOUND AND FLUOROSCOPIC GUIDED RIGHT IJ TUNNELED SINGLE-LUMEN POWER PICC LINE INSERTION MEDICATIONS: 1% lidocaine local CONTRAST:  None FLUOROSCOPY TIME:  18 seconds (2 mGy) COMPLICATIONS: None immediate. TECHNIQUE: The procedure, risks, benefits, and alternatives were explained to the patient and informed written consent was obtained. A timeout was performed prior to the initiation of the procedure. The right neck and chest were prepped with chlorhexidine in a sterile fashion, and a sterile drape was applied covering the operative field. Maximum barrier sterile technique with sterile gowns and gloves were used for the procedure. A timeout was performed prior to the initiation of the procedure. Local anesthesia was provided with 1% lidocaine. Under direct ultrasound guidance, the right internal jugular vein was accessed  with a micropuncture kit after the overlying soft tissues were anesthetized with 1% lidocaine. An ultrasound image was saved for documentation purposes. A guidewire was advanced to the level of the superior caval-atrial junction for measurement purposes and the PICC line was cut to length. In the right infraclavicular chest, the PICC line was tunneled subcutaneously to the venotomy site. A peel-away sheath  was placed and a 24 cm, 5 French, single-lumen tunneled power PICC linewas inserted to level of the superior caval-atrial junction. A post procedure spot fluoroscopic was obtained. The catheter easily aspirated and flushed and was sutured in place. Catheter secured with Ethilon suture. Venotomy site closed with derma bond. A dressing was placed. The patient tolerated the procedure well without immediate post procedural complication. FINDINGS: After catheter placement, the tip lies within the superior cavoatrial junction. The catheter aspirates and flushes normally and is ready for immediate use. IMPRESSION: Successful ultrasound and fluoroscopic guided placement of a right internal jugular vein approach, 24 cm, 5 French, single-lumen tunneled powerPICC with tip at the superior caval-atrial junction. The PICC line is ready for immediate use. Electronically Signed   By: Jerilynn Mages.  Shick M.D.   On: 05/07/2017 12:46   Ir US Guide Vasc Access Right  Result Date: 05/07/2017 INDICATION: Small bowel ileostomy, chronic dehydration, access for IV hydration EXAM: ULTRASOUND AND FLUOROSCOPIC GUIDED RIGHT IJ TUNNELED SINGLE-LUMEN POWER PICC LINE INSERTION MEDICATIONS: 1% lidocaine local CONTRAST:  None FLUOROSCOPY TIME:  18 seconds (2 mGy) COMPLICATIONS: None immediate. TECHNIQUE: The procedure, risks, benefits, and alternatives were explained to the patient and informed written consent was obtained. A timeout was performed prior to the initiation of the procedure. The right neck and chest were prepped with chlorhexidine in a sterile fashion, and a sterile drape was applied covering the operative field. Maximum barrier sterile technique with sterile gowns and gloves were used for the procedure. A timeout was performed prior to the initiation of the procedure. Local anesthesia was provided with 1% lidocaine. Under direct ultrasound guidance, the right internal jugular vein was accessed with a micropuncture kit after the overlying  soft tissues were anesthetized with 1% lidocaine. An ultrasound image was saved for documentation purposes. A guidewire was advanced to the level of the superior caval-atrial junction for measurement purposes and the PICC line was cut to length. In the right infraclavicular chest, the PICC line was tunneled subcutaneously to the venotomy site. A peel-away sheath was placed and a 24 cm, 5 French, single-lumen tunneled power PICC linewas inserted to level of the superior caval-atrial junction. A post procedure spot fluoroscopic was obtained. The catheter easily aspirated and flushed and was sutured in place. Catheter secured with Ethilon suture. Venotomy site closed with derma bond. A dressing was placed. The patient tolerated the procedure well without immediate post procedural complication. FINDINGS: After catheter placement, the tip lies within the superior cavoatrial junction. The catheter aspirates and flushes normally and is ready for immediate use. IMPRESSION: Successful ultrasound and fluoroscopic guided placement of a right internal jugular vein approach, 24 cm, 5 French, single-lumen tunneled powerPICC with tip at the superior caval-atrial junction. The PICC line is ready for immediate use. Electronically Signed   By: Jerilynn Mages.  Shick M.D.   On: 05/07/2017 12:46    A/P: 76yo gentleman complex abdominal surgical history and multiple prior SBOs presents with abdominal pain and distention and recurrent partial SBO. -NPO, IVF, serial labs/exams -NG if nausea or emesis  Will continue to follow, thank you for including [place] in this nice man's  care.   Romana Juniper, MD [facility], [place] Pager [phone]

## 2017-05-09 NOTE — Progress Notes (Signed)
Pharmacy Antibiotic Note  Derek [name] is a 76 y.o. male admitted on 05/09/2017 with sepsis/IAI.  Pharmacy has been consulted for cipro dosing. 76yo gentleman complex abdominal surgical history and multiple prior SBOs presents with abdominal pain and distention and recurrent partial SBO.   He did report taking abx for "positive" blood cultures last week, he was taking Cipro and Flagyl then was asked to stop the former due to interaction with Amiodarone. He also mentioned he changed his PICC line two days ago after his dog possibly pulled some of it out.  WBC 4.2, Cr 1.55, Wt 84.8. AF   Plan: Flagyl 500 mg IV q8h  Cipro 400 mg IV q12h F/u renal function, WBC, temp, culture data   No data recorded.   Recent Labs Lab 05/09/17 0548  WBC 4.2  CREATININE 1.55*    Estimated Creatinine Clearance: 45.9 mL/min (A) (by C-G formula based on SCr of 1.55 mg/dL (H)).    Allergies  Allergen Reactions  . Lorazepam Other (See Comments)    Reaction:  Hallucinations   . Humira [Adalimumab] Other (See Comments)    Pt states that he got pancreatitis.      Thank you for allowing pharmacy to be a part of this patient's care.  Eudelia Bunch, Pharm.D. [phone] 05/09/2017 9:53 AM
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
## 2017-05-09 NOTE — ED Provider Notes (Signed)
[facility] DEPT Provider Note   CSN: [id number] Arrival date & time: 05/09/17  0451     History   Chief Complaint Chief Complaint  Patient presents with  . Abdominal Pain    HPI Derek [name] is a 76 y.o. male.  Patient is a 76 year old male with extensive past medical history including Crohn's disease, colon cancer status post ileostomy with recurrent small bowel obstructions, atrial fibrillation on anticoagulation with Eliquis, chronic renal insufficiency, pulmonary embolism. He presents today for evaluation of abdominal pain. He reports decreased output in his ostomy bag. He denies any fevers or chills. The pain feels similar to his prior bowel obstructions.   The history is provided by the patient.  Abdominal Pain   This is a new problem. The current episode started yesterday. The problem occurs constantly. The problem has been gradually worsening. The pain is located in the generalized abdominal region. The quality of the pain is cramping. The pain is moderate. Associated symptoms include nausea. Pertinent negatives include fever, melena and vomiting. Nothing aggravates the symptoms. Nothing relieves the symptoms.    Past Medical History:  Diagnosis Date  . Anemia   . Anxiety   . Aortic insufficiency    a. mild-mod by echo 09/2015.  . Arthritis    "knees; left shoulder" (09/21/2013)  . Ascending aortic aneurysm (HCC)    a. last measurement 5.3 cm 03/2016 -> f/u planned 09/2015 to continue to follow.  . Atrial fibrillation ([place])   . B12 deficiency    takes Vit 12 shot every 14days   . Cataract   . CKD (chronic kidney disease) stage 3, GFR 30-59 ml/min (HCC) 08/22/2011  . Clotting disorder ([place])   . Crohn's disease ([place])   . Depression   . Enlarged prostate   . Enteric hyperoxaluria 02/21/2016  . GERD (gastroesophageal reflux disease)    takes Omeprazole daily  . Gout    takes Uloric and Colchicine daily  . Heart murmur   . Hepatitis C 1978   negtive RNA load  - spontaneously cleared  . Hiatal hernia   . History of blood transfusion 1978; 1990's; ?   "w/bowel resection; S/P allupurinol; ?" (09/21/2013)  . History of colon polyps   . History of kidney stones   . History of MRSA infection 2010  . History of pulmonary embolism 2006   both legs and both lungs /notes 08/26/2008 (09/21/2013)  . History of small bowel obstruction   . History of staph infection 1978  . Hyperoxaluria    Intestinal  . Hypertension   . Insomnia    takes Trazodone nightly  . Internal hemorrhoids   . LV dysfunction    a. h/o EF 45-50% in 2015, normalized on subsequent echoes.  . Nephrolithiasis   . Pancreatitis 2010   elevated lipase and amylase, stranding in tail of pancreas, ? from Humira  . Pancytopenia    Hx of  . Peripheral neuropathy    takes Gabapentin daily  . Pneumonia    hx of   . Post-traumatic stress syndrome    takes Paxil nightly  . PTSD (post-traumatic stress disorder)   . Pulmonary nodule    a. 12m by CT 05/2015, recommended f/u 6-12 months.  . RLS (restless legs syndrome)   . Rosacea conjunctivitis(372.31)    takes Minocin daily  . Secondary hyperparathyroidism ([place] 02/21/2016  . Sinus bradycardia   . Skin cancer    "cut/burned off left ear and face" (09/21/2013)  . Small bowel obstruction ([place]   .  Thrombocytopenia ([place])    hx of    Patient Active Problem List   Diagnosis Date Noted  . Anorexia 02/04/2017  . Aortic valve regurgitation 11/05/2016  . Bowel perforation ([place]) 08/27/2016  . Colonic ischemia ([place]) 08/27/2016  . Pain of upper abdomen   . Chronic anticoagulation 08/18/2016  . Sinus bradycardia 08/17/2016  . S/P colon resection 08/14/2016  . Adenomatous polyp of transverse colon s/p partial colectomy 08/14/2016 08/14/2016  . Infectious diarrhea   . Chronic diarrhea   . Essential hypertension 05/27/2016  . Hypotension 05/27/2016  . Hypomagnesemia 05/27/2016  . Peripheral neuropathic pain 05/26/2016  . GERD  (gastroesophageal reflux disease) 05/26/2016  . PAF (paroxysmal atrial fibrillation) ([place]) 05/26/2016  . Lung nodule 05/12/2016  . SBO (small bowel obstruction) ([place]) 05/11/2016  . Enteric hyperoxaluria 02/21/2016  . Secondary hyperparathyroidism ([place]) 02/21/2016  . Pain in joint, shoulder region 10/12/2013  . Decreased range of motion of left shoulder 10/12/2013  . Muscle weakness (generalized) 10/12/2013  . Restriction of joint motion 10/12/2013  . S/P shoulder replacement 09/21/2013  . Nonischemic cardiomyopathy ([place]) 09/07/2013  . Anxiety 08/22/2011  . CKD (chronic kidney disease) stage 3, GFR 30-59 ml/min (HCC) 08/22/2011  . Thrombocytopenia ([place]) 08/22/2011  . CORONARY ATHEROSCLEROSIS NATIVE CORONARY ARTERY 06/25/2010  . Ascending aortic aneurysm ([place]) 06/26/2009  . Crohn's ileocolitis ([place]) 06/26/2009  . B12 DEFICIENCY 05/09/2008  . Gout 05/09/2008  . POST TRAUMATIC STRESS SYNDROME 05/09/2008  . GERD 05/09/2008  . HEPATITIS C Ab positive RNA neg, HX OF 05/09/2008  . PULMONARY EMBOLISM, HX OF 05/09/2008  . GASTRITIS, HX OF 05/09/2008    Past Surgical History:  Procedure Laterality Date  . ANKLE SURGERY Right   . APPENDECTOMY  1978  . BOWEL RESECTION  1978 X 2  . CARDIOVERSION N/A 05/29/2016   Procedure: CARDIOVERSION;  Surgeon: Sanda Klein, MD;  Location: [facility];  Service: Cardiovascular;  Laterality: N/A;  . CHOLECYSTECTOMY    . COLON RESECTION N/A 08/14/2016   Procedure: LAPAROSCOPIC RESECTION TRANSVERSE COLON;  Surgeon: [name], MD;  Location: [facility];  Service: General;  Laterality: N/A;  . COLON SURGERY    . COLONOSCOPY    . ESOPHAGOGASTRODUODENOSCOPY    . EYE SURGERY     cataract surgery bilateral  . FOOT SURGERY Right    "took gout out"  . HEMICOLECTOMY Right   . ILEOCECETOMY  1978   [name] 05/10/2000  (09/21/2013)  . ILEOSTOMY    . INGUINAL HERNIA REPAIR Right   . IR FLUORO GUIDE CV LINE RIGHT  05/07/2017  . IR US GUIDE VASC ACCESS RIGHT   05/07/2017  . KNEE ARTHROSCOPY Left   . LAPAROTOMY N/A 08/27/2016   Procedure: EXPLORATORYLAPAROTOMY, LYSIS OF ADHESIONS, ILEOSTOMY, RIGHT COLECTOMY;  Surgeon: [name], MD;  Location: [facility];  Service: General;  Laterality: N/A;  . LIGAMENT REPAIR Left   . TEE WITHOUT CARDIOVERSION N/A 05/29/2016   Procedure: TRANSESOPHAGEAL ECHOCARDIOGRAM (TEE);  Surgeon: Sanda Klein, MD;  Location: [facility];  Service: Cardiovascular;  Laterality: N/A;  . TOTAL SHOULDER ARTHROPLASTY Left 09/21/2013  . TOTAL SHOULDER ARTHROPLASTY Left 09/21/2013   Procedure: LEFT TOTAL SHOULDER ARTHROPLASTY;  Surgeon: Marin Shutter, MD;  Location: [facility];  Service: Orthopedics;  Laterality: Left;       Home Medications    Prior to Admission medications   Medication Sig Start Date End Date Taking? Authorizing Provider  Acidophilus Lactobacillus CAPS Take 1 capsule by mouth daily. 06/08/16   [name], MD  amiodarone (  PACERONE) 200 MG tablet Take 1 tablet (200 mg total) by mouth daily. 11/05/16   End, Harrell Gave, MD  calcium carbonate (TUMS - DOSED IN MG ELEMENTAL CALCIUM) 500 MG chewable tablet Chew 2 tablets by mouth 2 (two) times daily.    [provider]  carboxymethylcellulose (REFRESH PLUS) 0.5 % SOLN Place 1 drop into both eyes 4 (four) times daily.    [provider]  cholecalciferol (VITAMIN D) 1000 units tablet Take 2,000 Units by mouth daily.     [provider]  cyanocobalamin (,VITAMIN B-12,) 1000 MCG/ML injection Inject 1,000 mcg into the muscle every 14 (fourteen) days.    [provider]  cycloSPORINE (RESTASIS) 0.05 % ophthalmic emulsion Place 1 drop into both eyes 2 (two) times daily.     [provider]  ELIQUIS 5 MG TABS tablet Take 1 tablet (5 mg total) by mouth 2 (two) times daily. 11/05/16   End, Harrell Gave, MD  febuxostat (ULORIC) 40 MG tablet Take 80 mg by mouth daily.    [provider]  gabapentin (NEURONTIN) 100 MG capsule Take  100-200 mg by mouth 2 (two) times daily. Pt takes one capsule in the morning and two at night.    [provider]  HYDROcodone-acetaminophen (NORCO/VICODIN) 5-325 MG tablet Take 1-2 tablets by mouth every 6 (six) hours as needed for severe pain. 01/28/17   Mesner, Corene Cornea, MD  ibuprofen (ADVIL,MOTRIN) 400 MG tablet Take 1 tablet (400 mg total) by mouth every 6 (six) hours as needed for moderate pain. 01/28/17   Mesner, Corene Cornea, MD  loperamide (IMODIUM) 2 MG capsule Take 4 mg by mouth 2 (two) times daily.    [provider]  magnesium oxide (MAGNESIUM-OXIDE) 400 (241.3 Mg) MG tablet Take 800 mg by mouth 3 (three) times daily.     [provider]  Multiple Vitamin (MULTIVITAMIN WITH MINERALS) TABS tablet Take 1 tablet by mouth daily.    [provider]  omeprazole (PRILOSEC) 20 MG capsule Take 20 mg by mouth daily.      [provider]  ondansetron (ZOFRAN) 4 MG tablet Take 1 tablet (4 mg total) by mouth every 6 (six) hours. 10/31/16   Nat Christen, MD  oxyCODONE-acetaminophen (PERCOCET) 5-325 MG tablet Take 1-2 tablets by mouth every 4 (four) hours as needed. 10/31/16   Nat Christen, MD  PARoxetine (PAXIL) 40 MG tablet Take 20 mg by mouth at bedtime.    [provider]  sodium bicarbonate 650 MG tablet Take 650 mg by mouth 2 (two) times daily.    [provider]  sodium chloride 38.5 mEq/L, potassium chloride 20 mEq/L in dextrose 10 % 1,000 mL Inject 750 mL/hr into the vein continuous.    [provider]  terazosin (HYTRIN) 2 MG capsule Take 2 mg by mouth at bedtime.     [provider]  traZODone (DESYREL) 150 MG tablet Take 75-150 mg by mouth at bedtime as needed for sleep.     [provider]    Family History Family History  Problem Relation Age of Onset  . Kidney disease Father   . Hypertension Father   . Aneurysm Mother   . Aneurysm Sister   . Esophageal cancer Neg Hx   . Stomach cancer Neg Hx   . Rectal  cancer Neg Hx     Social History Social History  Substance Use Topics  . Smoking status: Former Smoker    Packs/day: 2.00    Years: 20.00    Types:  Cigarettes    Quit date: 08/04/1975  . Smokeless tobacco: Former Systems developer    Types: [place] date: 08/03/1978     Comment: 09/21/2013 "quit smoking in the late 1970's; stopped chewing couple years after I quit smoking"  . Alcohol use No     Allergies   Lorazepam and Humira [adalimumab]   Review of Systems Review of Systems  Constitutional: Negative for fever.  Gastrointestinal: Positive for abdominal pain and nausea. Negative for melena and vomiting.  All other systems reviewed and are negative.    Physical Exam Updated Vital Signs BP 106/66   Pulse (!) 32   Resp 19   SpO2 92%   Physical Exam  Constitutional: He is oriented to person, place, and time. He appears well-developed and well-nourished. No distress.  HENT:  Head: Normocephalic and atraumatic.  Mouth/Throat: Oropharynx is clear and moist.  Neck: Normal range of motion. Neck supple.  Cardiovascular: Normal rate and regular rhythm.  Exam reveals no friction rub.   No murmur heard. Pulmonary/Chest: Effort normal and breath sounds normal. No respiratory distress. He has no wheezes. He has no rales.  Abdominal: Soft. Bowel sounds are normal. He exhibits no distension. There is tenderness. There is no rebound and no guarding.  The ileostomy appears clean. There is slight liquid drainage in the bag. There is no blood. Abdomen is tender to the right lower and left lower quadrant.  Musculoskeletal: Normal range of motion. He exhibits no edema.  Neurological: He is alert and oriented to person, place, and time. Coordination normal.  Skin: Skin is warm and dry. He is not diaphoretic.  Nursing note and vitals reviewed.    ED Treatments / Results  Labs (all labs ordered are listed, but only abnormal results are displayed) Labs Reviewed  COMPREHENSIVE METABOLIC PANEL -  Abnormal; Notable for the following:       Result Value   Glucose, Bld 127 (*)    Creatinine, Ser 1.55 (*)    Total Protein 6.4 (*)    Albumin 3.4 (*)    ALT 16 (*)    Alkaline Phosphatase 207 (*)    GFR calc non Af Amer 42 (*)    GFR calc Af Amer 49 (*)    All other components within normal limits  CBC WITH DIFFERENTIAL/PLATELET - Abnormal; Notable for the following:    Hemoglobin 11.8 (*)    HCT 36.2 (*)    RDW 16.5 (*)    Platelets 107 (*)    All other components within normal limits  LIPASE, BLOOD    EKG  EKG Interpretation None       Radiology Ct Abdomen Pelvis Wo Contrast  Result Date: 05/09/2017 CLINICAL DATA:  Mid abdominal pain with history of bowel obstruction and ischemic bowel with osteotomy. Assess for recurrent obstruction. EXAM: CT ABDOMEN AND PELVIS WITHOUT CONTRAST TECHNIQUE: Multidetector CT imaging of the abdomen and pelvis was performed following the standard protocol without IV contrast. COMPARISON:  03/08/2017 FINDINGS: Lower chest: Mild scarring. No active process. No pleural or pericardial fluid. Hepatobiliary: Liver parenchyma is normal. Previous cholecystectomy. Ductal system within normal limits given previous cholecystectomy. Pancreas: Normal Spleen: Persistent moderate splenomegaly with length of 17 cm. No focal lesion. Adrenals/Urinary Tract: Adrenal glands are normal. Bilateral small nonobstructing stones, right more numerous than left. Small renal cysts. No hydronephrosis. Renal atrophy. Stomach/Bowel: Previous colectomy with ostomy to the right of midline. Residual left colon is collapsed. This does contain some previously administered contrast. There dilated  loops of small intestine consistent with partial small bowel obstruction. Vascular/Lymphatic: Aortic atherosclerosis. No aneurysm. IVC is normal. No retroperitoneal adenopathy. Reproductive: Normal Other: No free fluid or air. Musculoskeletal: Ordinary lumbar degenerative changes. IMPRESSION:  Previous bowel resection with right para median ostomy. Partial small bowel obstruction with dilated residual small intestine. No free fluid or air. Chronic splenomegaly. Chronic renal atrophy with nonobstructing small calculi and small cysts. Aortic atherosclerosis. Electronically Signed   By: Nelson Chimes M.D.   On: 05/09/2017 07:17   Dg Chest 1 View  Result Date: 05/07/2017 CLINICAL DATA:  PICC placement EXAM: CHEST 1 VIEW COMPARISON:  01/28/2017 chest radiograph. FINDINGS: Right internal jugular central venous catheter terminates in the upper third of the superior vena cava. Partially visualized left shoulder arthroplasty. Stable cardiomediastinal silhouette with top-normal heart size and aortic atherosclerosis. No pneumothorax. No pleural effusion. Low lung volumes. No pulmonary edema. Mild bibasilar scarring versus atelectasis. No acute consolidative airspace disease. IMPRESSION: 1. Right internal jugular central venous catheter terminates in the upper third of the superior vena cava. No pneumothorax. 2. Low lung volumes with mild bibasilar scarring versus atelectasis. Electronically Signed   By: Ilona Sorrel M.D.   On: 05/07/2017 18:38   Ir Fluoro Guide Cv Line Right  Result Date: 05/07/2017 INDICATION: Small bowel ileostomy, chronic dehydration, access for IV hydration EXAM: ULTRASOUND AND FLUOROSCOPIC GUIDED RIGHT IJ TUNNELED SINGLE-LUMEN POWER PICC LINE INSERTION MEDICATIONS: 1% lidocaine local CONTRAST:  None FLUOROSCOPY TIME:  18 seconds (2 mGy) COMPLICATIONS: None immediate. TECHNIQUE: The procedure, risks, benefits, and alternatives were explained to the patient and informed written consent was obtained. A timeout was performed prior to the initiation of the procedure. The right neck and chest were prepped with chlorhexidine in a sterile fashion, and a sterile drape was applied covering the operative field. Maximum barrier sterile technique with sterile gowns and gloves were used for the  procedure. A timeout was performed prior to the initiation of the procedure. Local anesthesia was provided with 1% lidocaine. Under direct ultrasound guidance, the right internal jugular vein was accessed with a micropuncture kit after the overlying soft tissues were anesthetized with 1% lidocaine. An ultrasound image was saved for documentation purposes. A guidewire was advanced to the level of the superior caval-atrial junction for measurement purposes and the PICC line was cut to length. In the right infraclavicular chest, the PICC line was tunneled subcutaneously to the venotomy site. A peel-away sheath was placed and a 24 cm, 5 French, single-lumen tunneled power PICC linewas inserted to level of the superior caval-atrial junction. A post procedure spot fluoroscopic was obtained. The catheter easily aspirated and flushed and was sutured in place. Catheter secured with Ethilon suture. Venotomy site closed with derma bond. A dressing was placed. The patient tolerated the procedure well without immediate post procedural complication. FINDINGS: After catheter placement, the tip lies within the superior cavoatrial junction. The catheter aspirates and flushes normally and is ready for immediate use. IMPRESSION: Successful ultrasound and fluoroscopic guided placement of a right internal jugular vein approach, 24 cm, 5 French, single-lumen tunneled powerPICC with tip at the superior caval-atrial junction. The PICC line is ready for immediate use. Electronically Signed   By: Jerilynn Mages.  Shick M.D.   On: 05/07/2017 12:46   Ir US Guide Vasc Access Right  Result Date: 05/07/2017 INDICATION: Small bowel ileostomy, chronic dehydration, access for IV hydration EXAM: ULTRASOUND AND FLUOROSCOPIC GUIDED RIGHT IJ TUNNELED SINGLE-LUMEN POWER PICC LINE INSERTION MEDICATIONS: 1% lidocaine local CONTRAST:  None FLUOROSCOPY  TIME:  18 seconds (2 mGy) COMPLICATIONS: None immediate. TECHNIQUE: The procedure, risks, benefits, and alternatives  were explained to the patient and informed written consent was obtained. A timeout was performed prior to the initiation of the procedure. The right neck and chest were prepped with chlorhexidine in a sterile fashion, and a sterile drape was applied covering the operative field. Maximum barrier sterile technique with sterile gowns and gloves were used for the procedure. A timeout was performed prior to the initiation of the procedure. Local anesthesia was provided with 1% lidocaine. Under direct ultrasound guidance, the right internal jugular vein was accessed with a micropuncture kit after the overlying soft tissues were anesthetized with 1% lidocaine. An ultrasound image was saved for documentation purposes. A guidewire was advanced to the level of the superior caval-atrial junction for measurement purposes and the PICC line was cut to length. In the right infraclavicular chest, the PICC line was tunneled subcutaneously to the venotomy site. A peel-away sheath was placed and a 24 cm, 5 French, single-lumen tunneled power PICC linewas inserted to level of the superior caval-atrial junction. A post procedure spot fluoroscopic was obtained. The catheter easily aspirated and flushed and was sutured in place. Catheter secured with Ethilon suture. Venotomy site closed with derma bond. A dressing was placed. The patient tolerated the procedure well without immediate post procedural complication. FINDINGS: After catheter placement, the tip lies within the superior cavoatrial junction. The catheter aspirates and flushes normally and is ready for immediate use. IMPRESSION: Successful ultrasound and fluoroscopic guided placement of a right internal jugular vein approach, 24 cm, 5 French, single-lumen tunneled powerPICC with tip at the superior caval-atrial junction. The PICC line is ready for immediate use. Electronically Signed   By: Jerilynn Mages.  Shick M.D.   On: 05/07/2017 12:46    Procedures Procedures (including critical care  time)  Medications Ordered in ED Medications  sodium chloride 0.9 % bolus 1,000 mL (1,000 mLs Intravenous New Bag/Given 05/09/17 0558)  morphine 4 MG/ML injection 4 mg (4 mg Intravenous Given 05/09/17 0559)  ondansetron (ZOFRAN) injection 4 mg (4 mg Intravenous Given 05/09/17 0558)     Initial Impression / Assessment and Plan / ED Course  I have reviewed the triage vital signs and the nursing notes.  Pertinent labs & imaging results that were available during my care of the patient were reviewed by me and considered in my medical decision making (see chart for details).  CT scan shows what appears to be a partial small bowel obstruction. I've discussed this with Dr. Windle Guard from general surgery who will consult on the patient. He will be admitted to the hospitalist service for further evaluation.  Final Clinical Impressions(s) / ED Diagnoses   Final diagnoses:  None    New Prescriptions New Prescriptions   No medications on file     Veryl Speak, MD 05/09/17 [phone]
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
## 2017-05-10 LAB — BLOOD CULTURE ID PANEL (REFLEXED)
Acinetobacter baumannii: NOT DETECTED
CANDIDA ALBICANS: NOT DETECTED
CANDIDA TROPICALIS: NOT DETECTED
Candida glabrata: NOT DETECTED
Candida krusei: NOT DETECTED
Candida parapsilosis: NOT DETECTED
ENTEROBACTERIACEAE SPECIES: NOT DETECTED
Enterobacter cloacae complex: NOT DETECTED
Enterococcus species: NOT DETECTED
Escherichia coli: NOT DETECTED
HAEMOPHILUS INFLUENZAE: NOT DETECTED
KLEBSIELLA PNEUMONIAE: NOT DETECTED
Klebsiella oxytoca: NOT DETECTED
Listeria monocytogenes: NOT DETECTED
METHICILLIN RESISTANCE: DETECTED — AB
NEISSERIA MENINGITIDIS: NOT DETECTED
Proteus species: NOT DETECTED
Pseudomonas aeruginosa: NOT DETECTED
SERRATIA MARCESCENS: NOT DETECTED
STAPHYLOCOCCUS AUREUS BCID: NOT DETECTED
STAPHYLOCOCCUS SPECIES: DETECTED — AB
STREPTOCOCCUS PYOGENES: NOT DETECTED
STREPTOCOCCUS SPECIES: NOT DETECTED
Streptococcus agalactiae: NOT DETECTED
Streptococcus pneumoniae: NOT DETECTED

## 2017-05-10 LAB — COMPREHENSIVE METABOLIC PANEL
ALT: 17 U/L (ref 17–63)
AST: 27 U/L (ref 15–41)
Albumin: 3.3 g/dL — ABNORMAL LOW (ref 3.5–5.0)
Alkaline Phosphatase: 178 U/L — ABNORMAL HIGH (ref 38–126)
Anion gap: 10 (ref 5–15)
BILIRUBIN TOTAL: 0.7 mg/dL (ref 0.3–1.2)
BUN: 12 mg/dL (ref 6–20)
CO2: 29 mmol/L (ref 22–32)
CREATININE: 1.53 mg/dL — AB (ref 0.61–1.24)
Calcium: 8.5 mg/dL — ABNORMAL LOW (ref 8.9–10.3)
Chloride: 99 mmol/L — ABNORMAL LOW (ref 101–111)
GFR calc Af Amer: 50 mL/min — ABNORMAL LOW (ref >60)
GFR, EST NON AFRICAN AMERICAN: 43 mL/min — AB (ref >60)
Glucose, Bld: 107 mg/dL — ABNORMAL HIGH (ref 65–99)
Potassium: 4.7 mmol/L (ref 3.5–5.1)
Sodium: 138 mmol/L (ref 135–145)
TOTAL PROTEIN: 6.1 g/dL — AB (ref 6.5–8.1)

## 2017-05-10 LAB — CBC
HCT: 37 % — ABNORMAL LOW (ref 39.0–52.0)
Hemoglobin: 11.7 g/dL — ABNORMAL LOW (ref 13.0–17.0)
MCH: 27.3 pg (ref 26.0–34.0)
MCHC: 31.6 g/dL (ref 30.0–36.0)
MCV: 86.4 fL (ref 78.0–100.0)
PLATELETS: 110 10*3/uL — AB (ref 150–400)
RBC: 4.28 MIL/uL (ref 4.22–5.81)
RDW: 16.6 % — AB (ref 11.5–15.5)
WBC: 4.8 10*3/uL (ref 4.0–10.5)

## 2017-05-10 MED ORDER — SODIUM CHLORIDE 0.9% FLUSH
10.0000 mL | INTRAVENOUS | Status: DC | PRN
  Administered 2017-05-13: 10 mL
  Filled 2017-05-10: qty 40

## 2017-05-10 MED ORDER — PANTOPRAZOLE SODIUM 40 MG PO TBEC
40.0000 mg | DELAYED_RELEASE_TABLET | Freq: Every day | ORAL | Status: DC
  Administered 2017-05-11 – 2017-05-12 (×2): 40 mg via ORAL
  Filled 2017-05-10 (×2): qty 1

## 2017-05-10 NOTE — Progress Notes (Signed)
[facility] PROGRESS NOTE  Derek [name] VOH:[id number] DOB: 07/17/1941 DOA: 05/09/2017 PCP: Sharilyn Sites, MD  Interim summary and HPI 76 yo male with hx of crohn's disease s/p ileostomy in Jan 2018 (scheudled for reverse next month) who came with cc of abdominal pain , diffuse, started lastnight w/o nuasea or vomiting but with increased iloestomy bag output w/o blood w/o fever/chills. In the ED, imaging showed that patient has P.SBO and surgery advised admitting to IM for observation. Patient denied any other complaints. He did report taking abx for "positive" blood cultures last week, he was taking Cipro and Flagyl then was asked to stop the former due to interaction with Amiodarone  Assessment/Plan: 1-partial SBO: -most likely associated with adhesions  -appears to have opened up -will advance diet to CLD -continue conservative management and supportive care -will follow clinical response  -continue IVF's, PRN antiemetics and will replete electrolytes as needed   2-atrial fibrillation (PAF) -CHADsVASC score 4 -will continue amiodarone -continue eliquis -rate has remained well controlled and SR currently  3-GERD -will use PPI  4-depression and anxiety -will continue paxil and trazodone   5-BPH -will continue hytrin  6-chronic metabolic acidosis: -due to CKD stage and high output ostomy -will continue IVF's -continue sodium bicarb  7-CKD stage 3 -stable and at baseline -will follow trend  8-positive bacteremia -1/2 -suggesting colonization vs contaminant -patient is non toxic -case discussed with ID; recommending to recheck blood cx's   Code Status: Full Family Communication: wife at bedside  Disposition Plan: remain inpatient; continue conservative management and advance diet. Repeat blood cx's as per ID recommendations.   Consultants:  [facility]  ID (Dr. Linus Salmons)  Procedures:  See below for x-ray reports   Antibiotics:  cipro and flagyl  10/7>>>10/8    HPI/Subjective: Feeling better today. Reported mild episode of insignificant vomiting eralier today; since then has had multiple liquid stools into his colostomy bag. No further nausea, vomiting, CP, SOB or any other complaints.   Objective: Vitals:   05/09/17 2119 05/10/17 0415  BP: 119/71 115/61  Pulse: 64 66  Resp: 20 20  Temp: 97.7 F (36.5 C) 98 F (36.7 C)  SpO2: 95% 96%    Intake/Output Summary (Last 24 hours) at 05/10/17 1819 Last data filed at 05/10/17 1504  Gross per 24 hour  Intake             3000 ml  Output             1400 ml  Net             1600 ml   Filed Weights   05/09/17 1500  Weight: 87.5 kg (192 lb 14.4 oz)    Exam:   General: no fever, no CP, no SOB, no further nausea or vomiting. Patient with multiple liquid stools into his colostomy bag. reported no abd pain.  Cardiovascular: S1 and S2, no rubs, no gallops, no JVD  Respiratory: CTA bilaterally  Abdomen: soft, no guarding, positive BS. Colostomy bag in place, half way full with brown-greenish feculent material.  Musculoskeletal: no edema, no cyanosis   Data Reviewed: Basic Metabolic Panel:  Recent Labs Lab 05/09/17 0548 05/10/17 0528  NA 143 138  K 4.1 4.7  CL 107 99*  CO2 26 29  GLUCOSE 127* 107*  BUN 12 12  CREATININE 1.55* 1.53*  CALCIUM 8.9 8.5*   Liver Function Tests:  Recent Labs Lab 05/09/17 0548 05/10/17 0528  AST 29 27  ALT 16* 17  ALKPHOS  207* 178*  BILITOT 0.8 0.7  PROT 6.4* 6.1*  ALBUMIN 3.4* 3.3*    Recent Labs Lab 05/09/17 0548  LIPASE 35   CBC:  Recent Labs Lab 05/09/17 0548 05/10/17 0528  WBC 4.2 4.8  NEUTROABS 3.2  --   HGB 11.8* 11.7*  HCT 36.2* 37.0*  MCV 85.6 86.4  PLT 107* 110*   CBG: No results for input(s): GLUCAP in the last 168 hours.  Recent Results (from the past 240 hour(s))  Culture, blood (routine x 2)     Status: None (Preliminary result)   Collection Time: 05/09/17  9:55 AM  Result Value Ref Range  Status   Specimen Description BLOOD PICC LINE  Final   Special Requests   Final    BOTTLES DRAWN AEROBIC AND ANAEROBIC Blood Culture adequate volume   Culture  Setup Time   Final    GRAM POSITIVE COCCI IN CLUSTERS IN BOTH AEROBIC AND ANAEROBIC BOTTLES CRITICAL RESULT CALLED TO, READ BACK BY AND VERIFIED WITH: D WOFFORD,PHARMD AT 2707 05/10/17 BY L BENFIELD Performed at [facility], [place] [address] [place], [place] [address]    Culture GRAM POSITIVE COCCI  Final   Report Status PENDING  Incomplete  Blood Culture ID Panel (Reflexed)     Status: Abnormal   Collection Time: 05/09/17  9:55 AM  Result Value Ref Range Status   Enterococcus species NOT DETECTED NOT DETECTED Final   Listeria monocytogenes NOT DETECTED NOT DETECTED Final   Staphylococcus species DETECTED (A) NOT DETECTED Final    Comment: Methicillin (oxacillin) resistant coagulase negative staphylococcus. Possible blood culture contaminant (unless isolated from more than one blood culture draw or clinical case suggests pathogenicity). No antibiotic treatment is indicated for blood  culture contaminants. CRITICAL RESULT CALLED TO, READ BACK BY AND VERIFIED WITH: D WOFFORD,PHARMD AT 4920 05/10/17 BY L BENFIELD    Staphylococcus aureus NOT DETECTED NOT DETECTED Final   Methicillin resistance DETECTED (A) NOT DETECTED Final    Comment: CRITICAL RESULT CALLED TO, READ BACK BY AND VERIFIED WITH: D WOFFORD,PHARMD AT 1007 05/10/17 BY L BENFIELD    Streptococcus species NOT DETECTED NOT DETECTED Final   Streptococcus agalactiae NOT DETECTED NOT DETECTED Final   Streptococcus pneumoniae NOT DETECTED NOT DETECTED Final   Streptococcus pyogenes NOT DETECTED NOT DETECTED Final   Acinetobacter baumannii NOT DETECTED NOT DETECTED Final   Enterobacteriaceae species NOT DETECTED NOT DETECTED Final   Enterobacter cloacae complex NOT DETECTED NOT DETECTED Final   Escherichia coli NOT DETECTED NOT DETECTED Final   Klebsiella oxytoca NOT  DETECTED NOT DETECTED Final   Klebsiella pneumoniae NOT DETECTED NOT DETECTED Final   Proteus species NOT DETECTED NOT DETECTED Final   Serratia marcescens NOT DETECTED NOT DETECTED Final   Haemophilus influenzae NOT DETECTED NOT DETECTED Final   Neisseria meningitidis NOT DETECTED NOT DETECTED Final   Pseudomonas aeruginosa NOT DETECTED NOT DETECTED Final   Candida albicans NOT DETECTED NOT DETECTED Final   Candida glabrata NOT DETECTED NOT DETECTED Final   Candida krusei NOT DETECTED NOT DETECTED Final   Candida parapsilosis NOT DETECTED NOT DETECTED Final   Candida tropicalis NOT DETECTED NOT DETECTED Final    Comment: Performed at [facility], [place]. [address] [place], [place] [address]  Culture, blood (routine x 2)     Status: None (Preliminary result)   Collection Time: 05/09/17 10:00 AM  Result Value Ref Range Status   Specimen Description BLOOD LEFT ANTECUBITAL  Final   Special Requests  Final    BOTTLES DRAWN AEROBIC AND ANAEROBIC Blood Culture adequate volume   Culture   Final    NO GROWTH 1 DAY Performed at [facility], [place] [address], [place], [place] [address]    Report Status PENDING  Incomplete     Studies: Ct Abdomen Pelvis Wo Contrast  Result Date: 05/09/2017 CLINICAL DATA:  Mid abdominal pain with history of bowel obstruction and ischemic bowel with osteotomy. Assess for recurrent obstruction. EXAM: CT ABDOMEN AND PELVIS WITHOUT CONTRAST TECHNIQUE: Multidetector CT imaging of the abdomen and pelvis was performed following the standard protocol without IV contrast. COMPARISON:  03/08/2017 FINDINGS: Lower chest: Mild scarring. No active process. No pleural or pericardial fluid. Hepatobiliary: Liver parenchyma is normal. Previous cholecystectomy. Ductal system within normal limits given previous cholecystectomy. Pancreas: Normal Spleen: Persistent moderate splenomegaly with length of 17 cm. No focal lesion. Adrenals/Urinary Tract: Adrenal glands are normal.  Bilateral small nonobstructing stones, right more numerous than left. Small renal cysts. No hydronephrosis. Renal atrophy. Stomach/Bowel: Previous colectomy with ostomy to the right of midline. Residual left colon is collapsed. This does contain some previously administered contrast. There dilated loops of small intestine consistent with partial small bowel obstruction. Vascular/Lymphatic: Aortic atherosclerosis. No aneurysm. IVC is normal. No retroperitoneal adenopathy. Reproductive: Normal Other: No free fluid or air. Musculoskeletal: Ordinary lumbar degenerative changes. IMPRESSION: Previous bowel resection with right para median ostomy. Partial small bowel obstruction with dilated residual small intestine. No free fluid or air. Chronic splenomegaly. Chronic renal atrophy with nonobstructing small calculi and small cysts. Aortic atherosclerosis. Electronically Signed   By: Nelson Chimes M.D.   On: 05/09/2017 07:17    Scheduled Meds: . amiodarone  200 mg Oral Daily  . apixaban  5 mg Oral BID  . calcium carbonate  2 tablet Oral BID  . cycloSPORINE  1 drop Both Eyes BID  . pantoprazole (PROTONIX) IV  40 mg Intravenous Q24H  . PARoxetine  20 mg Oral QHS  . polyvinyl alcohol  1 drop Both Eyes QID  . sodium bicarbonate  1,300 mg Oral BID  . terazosin  2 mg Oral QHS  . traZODone  100 mg Oral QHS   Continuous Infusions: . ciprofloxacin Stopped (05/10/17 1203)  . dextrose 5 % and 0.45% NaCl 75 mL/hr at 05/10/17 0423  . metronidazole 500 mg (05/10/17 1804)     Time spent: 30 minutes    Barton Dubois  [facility] Pager [phone]. If 7PM-7AM, please contact night-coverage at [email or web address], password [facility] 05/10/2017, 6:19 PM  LOS: 1 day

## 2017-05-10 NOTE — Progress Notes (Signed)
CC: SBO  Subjective: He looks fine and says he has opened up this AM.  He has allot of fluid coming from the ostomy.  He was on Cipro/Flagyl and not sure why.  Dr. Dyann Kief is looking into that.  He has had a PICC line and that is out now.   He has one blood culture showing MRSA.  Objective: Vital signs in last 24 hours: Temp:  [97.7 F (36.5 C)-98 F (36.7 C)] 98 F (36.7 C) (10/08 0415) Pulse Rate:  [62-66] 66 (10/08 0415) Resp:  [18-20] 20 (10/08 0415) BP: (107-119)/(61-71) 115/61 (10/08 0415) SpO2:  [94 %-96 %] 96 % (10/08 0415) Weight:  [87.5 kg (192 lb 14.4 oz)] 87.5 kg (192 lb 14.4 oz) (10/07 1500) Last BM Date: 05/09/17 NPO 1670 IV Voided x 2 Afebrile, VSS Creatinine stable, WBC stable, H/H is stable Staph on one blood culture CT scan 05/09/17:  Previous colectomy with ostomy to the right of midline. Residual left colon is collapsed. This does contain some previously administered contrast. There dilated loops of small intestine consistent with partial small bowel obstruction. Chronic splenomegaly. Chronic renal atrophy with nonobstructing small calculi and small cyst   Intake/Output from previous day: 10/07 0701 - 10/08 0700 In: 1670 [I.V.:1270; IV Piggyback:400] Out: -  Intake/Output this shift: No intake/output data recorded.  General appearance: alert, cooperative and no distress Resp: clear to auscultation bilaterally GI: soft, still a bit sore on the right,but no nausea,  or vomiting , he has had to empty the bag 2-3 times this AM.  Lab Results:   Recent Labs  05/09/17 0548 05/10/17 0528  WBC 4.2 4.8  HGB 11.8* 11.7*  HCT 36.2* 37.0*  PLT 107* 110*    BMET  Recent Labs  05/09/17 0548 05/10/17 0528  NA 143 138  K 4.1 4.7  CL 107 99*  CO2 26 29  GLUCOSE 127* 107*  BUN 12 12  CREATININE 1.55* 1.53*  CALCIUM 8.9 8.5*   PT/INR  Recent Labs  05/07/17 1000  LABPROT 13.1  INR 1.00     Recent Labs Lab 05/09/17 0548  05/10/17 0528  AST 29 27  ALT 16* 17  ALKPHOS 207* 178*  BILITOT 0.8 0.7  PROT 6.4* 6.1*  ALBUMIN 3.4* 3.3*     Lipase     Component Value Date/Time   LIPASE 35 05/09/2017 0548     Prior to Admission medications   Medication Sig Start Date End Date Taking? Authorizing Provider  Acidophilus Lactobacillus CAPS Take 1 capsule by mouth daily. 06/08/16  Yes [name], MD  amiodarone (PACERONE) 200 MG tablet Take 1 tablet (200 mg total) by mouth daily. 11/05/16  Yes End, Harrell Gave, MD  calcium carbonate (TUMS - DOSED IN MG ELEMENTAL CALCIUM) 500 MG chewable tablet Chew 2 tablets by mouth 2 (two) times daily.   Yes [provider]  carboxymethylcellulose (REFRESH PLUS) 0.5 % SOLN Place 1 drop into both eyes 4 (four) times daily.   Yes [provider]  cholecalciferol (VITAMIN D) 1000 units tablet Take 2,000 Units by mouth daily.    Yes [provider]  ciprofloxacin (CIPRO) 500 MG tablet Take 500 mg by mouth 2 (two) times daily.   Yes [provider]  cyanocobalamin (,VITAMIN B-12,) 1000 MCG/ML injection Inject 1,000 mcg into the muscle every 14 (fourteen) days.   Yes [provider]  cycloSPORINE (RESTASIS) 0.05 % ophthalmic emulsion Place 1 drop into both eyes 2 (two) times daily.  Yes [provider]  ELIQUIS 5 MG TABS tablet Take 1 tablet (5 mg total) by mouth 2 (two) times daily. 11/05/16  Yes End, Harrell Gave, MD  ferrous sulfate 325 (65 FE) MG tablet Take 325 mg by mouth daily with breakfast.   Yes [provider]  loperamide (IMODIUM) 2 MG capsule Take 4 mg by mouth 2 (two) times daily.   Yes [provider]  magnesium oxide (MAGNESIUM-OXIDE) 400 (241.3 Mg) MG tablet Take 800 mg by mouth 2 (two) times daily.    Yes [provider]  metroNIDAZOLE (FLAGYL) 500 MG tablet Take 500 mg by mouth 3 (three) times daily.   Yes [provider]  Multiple Vitamin (MULTIVITAMIN WITH MINERALS) TABS tablet  Take 1 tablet by mouth daily.   Yes [provider]  omeprazole (PRILOSEC) 20 MG capsule Take 20 mg by mouth daily.     Yes [provider]  PARoxetine (PAXIL) 40 MG tablet Take 20 mg by mouth at bedtime.   Yes [provider]  polycarbophil (FIBERCON) 625 MG tablet Take 625 mg by mouth 2 (two) times daily.   Yes [provider]  PRESCRIPTION MEDICATION Inject 150 mLs into the vein See admin instructions. Dextrose 5% in normal saline (0.9%) administered at 156m/hr over 6 hours   Yes [provider]  sodium bicarbonate 650 MG tablet Take 1,300 mg by mouth 2 (two) times daily.    Yes [provider]  terazosin (HYTRIN) 2 MG capsule Take 2 mg by mouth at bedtime.    Yes [provider]  traZODone (DESYREL) 100 MG tablet Take 100 mg by mouth at bedtime.   Yes [provider]    . ciprofloxacin Stopped (05/09/17 2212)  . dextrose 5 % and 0.45% NaCl 75 mL/hr at 05/10/17 0423  . metronidazole Stopped (05/10/17 0514)   Medications: . amiodarone  200 mg Oral Daily  . apixaban  5 mg Oral BID  . calcium carbonate  2 tablet Oral BID  . cycloSPORINE  1 drop Both Eyes BID  . pantoprazole (PROTONIX) IV  40 mg Intravenous Q24H  . PARoxetine  20 mg Oral QHS  . polyvinyl alcohol  1 drop Both Eyes QID  . sodium bicarbonate  1,300 mg Oral BID  . terazosin  2 mg Oral QHS  . traZODone  100 mg Oral QHS   . ciprofloxacin Stopped (05/09/17 2212)  . dextrose 5 % and 0.45% NaCl 75 mL/hr at 05/10/17 0423  . metronidazole Stopped (05/10/17 0514)   Anti-infectives    Start     Dose/Rate Route Frequency Ordered Stop   05/09/17 2200  ciprofloxacin (CIPRO) IVPB 400 mg     400 mg 200 mL/hr over 60 Minutes Intravenous Every 12 hours 05/09/17 0959     05/09/17 1000  metroNIDAZOLE (FLAGYL) IVPB 500 mg     500 mg 100 mL/hr over 60 Minutes Intravenous Every 8 hours 05/09/17 0847     05/09/17 0930  ciprofloxacin (CIPRO) IVPB 400 mg     400  mg 200 mL/hr over 60 Minutes Intravenous  Once 05/09/17 0916 05/09/17 1055     Assessment/Plan SBO S/p LAPAROSCOPIC RESECTION TRANSVERSE COLON, Colostomy; 08/14/16 Dr. DAlphonsa Overall MD;  prior hemicolectomy, ileocecectomy 05/2000, Inguinal hernia repair AIAscending aortic aneurysm MRSA in 1 blood culture AF/hx of PE/DVT; on Eliquis Hx of pancreatitis Anemia/B12 deficiency CKD BPH FEN:IV fluids/NPO =>> start clear liquids  ID:  Cipro/Flagyl stopped DVT:  Eliquis - last dose 2100 last PM  Plan:  I think he has opened up, no nausea or vomting, he has had to empty colostomy bag a couple times this AM.  Fluid is dark brown feculent, not much solid material in it.  I will start clears and we will follow with you.          LOS: 1 day    Quida Glasser 05/10/2017 [phone]

## 2017-05-10 NOTE — Progress Notes (Signed)
PHARMACY - PHYSICIAN COMMUNICATION CRITICAL VALUE ALERT - BLOOD CULTURE IDENTIFICATION (BCID)  Results for orders placed or performed during the hospital encounter of 05/09/17  Blood Culture ID Panel (Reflexed) (Collected: 05/09/2017  9:55 AM)  Result Value Ref Range   Enterococcus species NOT DETECTED NOT DETECTED   Listeria monocytogenes NOT DETECTED NOT DETECTED   Staphylococcus species DETECTED (A) NOT DETECTED   Staphylococcus aureus NOT DETECTED NOT DETECTED   Methicillin resistance DETECTED (A) NOT DETECTED   Streptococcus species NOT DETECTED NOT DETECTED   Streptococcus agalactiae NOT DETECTED NOT DETECTED   Streptococcus pneumoniae NOT DETECTED NOT DETECTED   Streptococcus pyogenes NOT DETECTED NOT DETECTED   Acinetobacter baumannii NOT DETECTED NOT DETECTED   Enterobacteriaceae species NOT DETECTED NOT DETECTED   Enterobacter cloacae complex NOT DETECTED NOT DETECTED   Escherichia coli NOT DETECTED NOT DETECTED   Klebsiella oxytoca NOT DETECTED NOT DETECTED   Klebsiella pneumoniae NOT DETECTED NOT DETECTED   Proteus species NOT DETECTED NOT DETECTED   Serratia marcescens NOT DETECTED NOT DETECTED   Haemophilus influenzae NOT DETECTED NOT DETECTED   Neisseria meningitidis NOT DETECTED NOT DETECTED   Pseudomonas aeruginosa NOT DETECTED NOT DETECTED   Candida albicans NOT DETECTED NOT DETECTED   Candida glabrata NOT DETECTED NOT DETECTED   Candida krusei NOT DETECTED NOT DETECTED   Candida parapsilosis NOT DETECTED NOT DETECTED   Candida tropicalis NOT DETECTED NOT DETECTED    Name of physician (or Provider) Contacted: [place]   Changes to prescribed antibiotics required: continue Cipro/Flagyl for now, MD feels staph is contaminant as sample drawn from newly placed PICC (patient's dog had been pulling on line PTA). Still trying to figure out why patient on PTA Cipro/Flagyl for positive blood cultures. Awaiting further information from Nephrologist's office as well as Lab  results from [employer], [place] A 05/10/2017  4:13 PM

## 2017-05-11 DIAGNOSIS — I10 Essential (primary) hypertension: Secondary | ICD-10-CM

## 2017-05-11 DIAGNOSIS — R7881 Bacteremia: Secondary | ICD-10-CM

## 2017-05-11 DIAGNOSIS — N183 Chronic kidney disease, stage 3 (moderate): Secondary | ICD-10-CM

## 2017-05-11 DIAGNOSIS — I480 Paroxysmal atrial fibrillation: Secondary | ICD-10-CM

## 2017-05-11 LAB — CBC
HEMATOCRIT: 36.3 % — AB (ref 39.0–52.0)
HEMOGLOBIN: 11.7 g/dL — AB (ref 13.0–17.0)
MCH: 27.7 pg (ref 26.0–34.0)
MCHC: 32.2 g/dL (ref 30.0–36.0)
MCV: 86 fL (ref 78.0–100.0)
Platelets: 99 10*3/uL — ABNORMAL LOW (ref 150–400)
RBC: 4.22 MIL/uL (ref 4.22–5.81)
RDW: 16.6 % — ABNORMAL HIGH (ref 11.5–15.5)
WBC: 3.8 10*3/uL — ABNORMAL LOW (ref 4.0–10.5)

## 2017-05-11 LAB — MAGNESIUM: Magnesium: 1.3 mg/dL — ABNORMAL LOW (ref 1.7–2.4)

## 2017-05-11 LAB — BASIC METABOLIC PANEL
Anion gap: 8 (ref 5–15)
BUN: 12 mg/dL (ref 6–20)
CHLORIDE: 102 mmol/L (ref 101–111)
CO2: 30 mmol/L (ref 22–32)
CREATININE: 1.53 mg/dL — AB (ref 0.61–1.24)
Calcium: 8.8 mg/dL — ABNORMAL LOW (ref 8.9–10.3)
GFR calc non Af Amer: 43 mL/min — ABNORMAL LOW (ref >60)
GFR, EST AFRICAN AMERICAN: 50 mL/min — AB (ref >60)
GLUCOSE: 113 mg/dL — AB (ref 65–99)
Potassium: 4.2 mmol/L (ref 3.5–5.1)
Sodium: 140 mmol/L (ref 135–145)

## 2017-05-11 NOTE — Progress Notes (Signed)
[facility]/Trauma Progress Note      Assessment/Plan AIAscending aortic aneurysm MRSA in 1 blood culture AF/hx of PE/DVT; on Eliquis Hx of pancreatitis Anemia/B12 deficiency CKD BPH  SBO - S/p LAPAROSCOPIC RESECTION TRANSVERSE COLON, Colostomy; 08/14/16 Dr. [name], MD; prior hemicolectomy, ileocecectomy 05/2000, Inguinal hernia repair - ostomy output and tolerating clears  FEN: fulls liquids  ID:  Cipro/Flagyl stopped DVT:  Eliquis  Plan: advance to fulls and we will follow with you.      LOS: 2 days    Subjective:  CC: abdominal pain  Mild pain just inferior to ostomy. No nausea or vomiting. Having ostomy output. Tolerating clears. No new complaints.   Objective: Vital signs in last 24 hours: Temp:  [97.7 F (36.5 C)-98.9 F (37.2 C)] 97.7 F (36.5 C) (10/09 0935) Pulse Rate:  [60-67] 62 (10/09 0935) Resp:  [18-20] 18 (10/09 0935) BP: (94-110)/(60-69) 110/69 (10/09 0935) SpO2:  [95 %-100 %] 100 % (10/09 0935) Last BM Date: 05/11/17  Intake/Output from previous day: 10/08 0701 - 10/09 0700 In: 1918.8 [P.O.:600; I.V.:918.8; IV Piggyback:400] Out: 1800 [Stool:1800] Intake/Output this shift: Total I/O In: 240 [P.O.:240] Out: -   PE: Gen:  Alert, NAD, pleasant, cooperative Pulm:  Rate and effort normal Abd: Soft, not distended, +BS, ostomy with dark green liquid in bag, mild generalized TTP worse just inferior to ostomy Skin: no rashes noted, warm and dry   Anti-infectives: Anti-infectives    Start     Dose/Rate Route Frequency Ordered Stop   05/09/17 2200  ciprofloxacin (CIPRO) IVPB 400 mg  Status:  Discontinued     400 mg 200 mL/hr over 60 Minutes Intravenous Every 12 hours 05/09/17 0959 05/10/17 1820   05/09/17 1000  metroNIDAZOLE (FLAGYL) IVPB 500 mg  Status:  Discontinued     500 mg 100 mL/hr over 60 Minutes Intravenous Every 8 hours 05/09/17 0847 05/10/17 1820   05/09/17 0930  ciprofloxacin (CIPRO) IVPB 400 mg     400  mg 200 mL/hr over 60 Minutes Intravenous  Once 05/09/17 0916 05/09/17 1055      Lab Results:   Recent Labs  05/10/17 0528 05/11/17 0507  WBC 4.8 3.8*  HGB 11.7* 11.7*  HCT 37.0* 36.3*  PLT 110* 99*   BMET  Recent Labs  05/10/17 0528 05/11/17 0507  NA 138 140  K 4.7 4.2  CL 99* 102  CO2 29 30  GLUCOSE 107* 113*  BUN 12 12  CREATININE 1.53* 1.53*  CALCIUM 8.5* 8.8*   PT/INR No results for input(s): LABPROT, INR in the last 72 hours. CMP     Component Value Date/Time   NA 140 05/11/2017 0507   NA 139 11/23/2012 1047   K 4.2 05/11/2017 0507   K 4.5 11/23/2012 1047   CL 102 05/11/2017 0507   CL 103 11/23/2012 1047   CO2 30 05/11/2017 0507   CO2 26 11/23/2012 1047   GLUCOSE 113 (H) 05/11/2017 0507   GLUCOSE 91 11/23/2012 1047   BUN 12 05/11/2017 0507   BUN 25.3 11/23/2012 1047   CREATININE 1.53 (H) 05/11/2017 0507   CREATININE 1.8 (H) 09/25/2015 1020   CREATININE 1.9 (H) 11/23/2012 1047   CALCIUM 8.8 (L) 05/11/2017 0507   CALCIUM 8.8 11/23/2012 1047   PROT 6.1 (L) 05/10/2017 0528   PROT 6.7 11/23/2012 1047   ALBUMIN 3.3 (L) 05/10/2017 0528   ALBUMIN 3.4 (L) 11/23/2012 1047   AST 27 05/10/2017 0528   AST 28 11/23/2012 1047   ALT  17 05/10/2017 0528   ALT 43 11/23/2012 1047   ALKPHOS 178 (H) 05/10/2017 0528   ALKPHOS 150 11/23/2012 1047   BILITOT 0.7 05/10/2017 0528   BILITOT 0.74 11/23/2012 1047   GFRNONAA 43 (L) 05/11/2017 0507   GFRAA 50 (L) 05/11/2017 0507   Lipase     Component Value Date/Time   LIPASE 35 05/09/2017 0548    Studies/Results: No results found.    Kalman Drape , [facility] 05/11/2017, 11:26 AM Pager: [phone] Consults: [phone] Mon-Fri 7:00 am-4:30 pm Sat-Sun 7:00 am-11:30 am

## 2017-05-11 NOTE — Progress Notes (Signed)
[facility] PROGRESS NOTE  Derek [name] AJG:[id number] DOB: 04-23-1941 DOA: 05/09/2017 PCP: Sharilyn Sites, MD  Interim summary and HPI 76 yo male with hx of crohn's disease s/p ileostomy in Jan 2018 (scheudled for reverse next month) who came with cc of abdominal pain , diffuse, started lastnight w/o nuasea or vomiting but with increased iloestomy bag output w/o blood w/o fever/chills. In the ED, imaging showed that patient has P.SBO and surgery advised admitting to IM for observation. Patient denied any other complaints. He did report taking abx for "positive" blood cultures last week, he was taking Cipro and Flagyl then was asked to stop the former due to interaction with Amiodarone  Assessment/Plan: 1-partial SBO: -most likely associated with adhesions  -appears to have opened up and with high output  -will adjust IVF's to prevent dehydration  -continue conservative management and supportive care -will advance diet to full liquid -continue IVF's, PRN antiemetics and electrolytes repletion as needed    2-atrial fibrillation (PAF) -CHADsVASC score 4 -will continue amiodarone and continue eliquis -rate has remained well controlled and patient is in SR  3-GERD -continue PPI   4-depression and anxiety -will continue paxil and trazodone   5-BPH -will continue hytrin -no complaints of urinary retention   6-chronic metabolic acidosis: -due to CKD stage 3 and high output ostomy -will continue IVF's -continue sodium bicarb tablets  7-CKD stage 3 -has remained stable and at baseline -will follow Cr trend  8-positive bacteremia -1/2 -suggesting colonization vs contaminant -patient is non toxic -case discussed with ID; recommending to recheck blood cx's -will follow repeated blood cx's results and determine treatment.  Code Status: Full Family Communication: wife at bedside  Disposition Plan: remain inpatient; continue conservative management and advance diet to  full liquid. Follow repeated blood cx's as per ID recommendations.   Consultants:  [facility]  ID (Dr. Linus Salmons)  Procedures:  See below for x-ray reports   Antibiotics:  cipro and flagyl 10/7>>>10/8   HPI/Subjective: No event overnight. Patient remains afebrile, no CP, no SOB and no abd pain. Reporting high ostomy output now.   Objective: Vitals:   05/11/17 1310 05/11/17 2049  BP: 116/65 109/62  Pulse: 62 63  Resp: 18 18  Temp: 97.9 F (36.6 C) 99.2 F (37.3 C)  SpO2: 99% 94%    Intake/Output Summary (Last 24 hours) at 05/11/17 2222 Last data filed at 05/11/17 1321  Gross per 24 hour  Intake              480 ml  Output              400 ml  Net               80 ml   Filed Weights   05/09/17 1500  Weight: 87.5 kg (192 lb 14.4 oz)    Exam:   General: afebrile, no CP, no SOB, no nausea, no vomiting. Reports high output from ostomy. No abd pain.  Cardiovascular: S1 and S2, no rubs, no gallops, no JVD  Respiratory: CTA bilaterally  Abdomen: soft, NT, positive BS, ostomy in place and ostomy bag almost full.  Musculoskeletal: no edema, no cyanosis   Data Reviewed: Basic Metabolic Panel:  Recent Labs Lab 05/09/17 0548 05/10/17 0528 05/11/17 0507  NA 143 138 140  K 4.1 4.7 4.2  CL 107 99* 102  CO2 26 29 30   GLUCOSE 127* 107* 113*  BUN 12 12 12   CREATININE 1.55* 1.53* 1.53*  CALCIUM 8.9 8.5* 8.8*  MG  --   --  1.3*   Liver Function Tests:  Recent Labs Lab 05/09/17 0548 05/10/17 0528  AST 29 27  ALT 16* 17  ALKPHOS 207* 178*  BILITOT 0.8 0.7  PROT 6.4* 6.1*  ALBUMIN 3.4* 3.3*    Recent Labs Lab 05/09/17 0548  LIPASE 35   CBC:  Recent Labs Lab 05/09/17 0548 05/10/17 0528 05/11/17 0507  WBC 4.2 4.8 3.8*  NEUTROABS 3.2  --   --   HGB 11.8* 11.7* 11.7*  HCT 36.2* 37.0* 36.3*  MCV 85.6 86.4 86.0  PLT 107* 110* 99*   CBG: No results for input(s): GLUCAP in the last 168 hours.  Recent Results (from the past 240 hour(s))  Culture,  blood (routine x 2)     Status: Abnormal (Preliminary result)   Collection Time: 05/09/17  9:55 AM  Result Value Ref Range Status   Specimen Description BLOOD PICC LINE  Final   Special Requests   Final    BOTTLES DRAWN AEROBIC AND ANAEROBIC Blood Culture adequate volume   Culture  Setup Time   Final    GRAM POSITIVE COCCI IN CLUSTERS IN BOTH AEROBIC AND ANAEROBIC BOTTLES CRITICAL RESULT CALLED TO, READ BACK BY AND VERIFIED WITH: D WOFFORD,PHARMD AT 5573 05/10/17 BY L BENFIELD    Culture (A)  Final    STAPHYLOCOCCUS SPECIES (COAGULASE NEGATIVE) THE SIGNIFICANCE OF ISOLATING THIS ORGANISM FROM A SINGLE SET OF BLOOD CULTURES WHEN MULTIPLE SETS ARE DRAWN IS UNCERTAIN. PLEASE NOTIFY THE MICROBIOLOGY DEPARTMENT WITHIN ONE WEEK IF SPECIATION AND SENSITIVITIES ARE REQUIRED. Performed at [facility], [place] [address], [place], [place] [address]    Report Status PENDING  Incomplete  Blood Culture ID Panel (Reflexed)     Status: Abnormal   Collection Time: 05/09/17  9:55 AM  Result Value Ref Range Status   Enterococcus species NOT DETECTED NOT DETECTED Final   Listeria monocytogenes NOT DETECTED NOT DETECTED Final   Staphylococcus species DETECTED (A) NOT DETECTED Final    Comment: Methicillin (oxacillin) resistant coagulase negative staphylococcus. Possible blood culture contaminant (unless isolated from more than one blood culture draw or clinical case suggests pathogenicity). No antibiotic treatment is indicated for blood  culture contaminants. CRITICAL RESULT CALLED TO, READ BACK BY AND VERIFIED WITH: D WOFFORD,PHARMD AT 4270 05/10/17 BY L BENFIELD    Staphylococcus aureus NOT DETECTED NOT DETECTED Final   Methicillin resistance DETECTED (A) NOT DETECTED Final    Comment: CRITICAL RESULT CALLED TO, READ BACK BY AND VERIFIED WITH: D WOFFORD,PHARMD AT 6237 05/10/17 BY L BENFIELD    Streptococcus species NOT DETECTED NOT DETECTED Final   Streptococcus agalactiae NOT DETECTED NOT DETECTED  Final   Streptococcus pneumoniae NOT DETECTED NOT DETECTED Final   Streptococcus pyogenes NOT DETECTED NOT DETECTED Final   Acinetobacter baumannii NOT DETECTED NOT DETECTED Final   Enterobacteriaceae species NOT DETECTED NOT DETECTED Final   Enterobacter cloacae complex NOT DETECTED NOT DETECTED Final   Escherichia coli NOT DETECTED NOT DETECTED Final   Klebsiella oxytoca NOT DETECTED NOT DETECTED Final   Klebsiella pneumoniae NOT DETECTED NOT DETECTED Final   Proteus species NOT DETECTED NOT DETECTED Final   Serratia marcescens NOT DETECTED NOT DETECTED Final   Haemophilus influenzae NOT DETECTED NOT DETECTED Final   Neisseria meningitidis NOT DETECTED NOT DETECTED Final   Pseudomonas aeruginosa NOT DETECTED NOT DETECTED Final   Candida albicans NOT DETECTED NOT DETECTED Final   Candida glabrata NOT DETECTED NOT DETECTED Final   Candida  krusei NOT DETECTED NOT DETECTED Final   Candida parapsilosis NOT DETECTED NOT DETECTED Final   Candida tropicalis NOT DETECTED NOT DETECTED Final    Comment: Performed at [facility], [place] [address] [place], [place] [address]  Culture, blood (routine x 2)     Status: None (Preliminary result)   Collection Time: 05/09/17 10:00 AM  Result Value Ref Range Status   Specimen Description BLOOD LEFT ANTECUBITAL  Final   Special Requests   Final    BOTTLES DRAWN AEROBIC AND ANAEROBIC Blood Culture adequate volume   Culture   Final    NO GROWTH 2 DAYS Performed at [facility], [place] [address], [place], [place] [address]    Report Status PENDING  Incomplete  Culture, blood (Routine X 2) w Reflex to ID Panel     Status: None (Preliminary result)   Collection Time: 05/10/17  6:29 PM  Result Value Ref Range Status   Specimen Description BLOOD LEFT ARM  Final   Special Requests   Final    BOTTLES DRAWN AEROBIC AND ANAEROBIC Blood Culture adequate volume   Culture   Final    NO GROWTH < 24 HOURS Performed at [facility], [place]  [address], [place], [place] [address]    Report Status PENDING  Incomplete  Culture, blood (Routine X 2) w Reflex to ID Panel     Status: None (Preliminary result)   Collection Time: 05/10/17  6:36 PM  Result Value Ref Range Status   Specimen Description BLOOD LEFT ARM  Final   Special Requests   Final    BOTTLES DRAWN AEROBIC AND ANAEROBIC Blood Culture adequate volume   Culture   Final    NO GROWTH < 24 HOURS Performed at [facility], [place] [address] [place], [place] [address]    Report Status PENDING  Incomplete     Studies: No results found.  Scheduled Meds: . amiodarone  200 mg Oral Daily  . apixaban  5 mg Oral BID  . calcium carbonate  2 tablet Oral BID  . cycloSPORINE  1 drop Both Eyes BID  . pantoprazole  40 mg Oral Q1200  . PARoxetine  20 mg Oral QHS  . polyvinyl alcohol  1 drop Both Eyes QID  . sodium bicarbonate  1,300 mg Oral BID  . terazosin  2 mg Oral QHS  . traZODone  100 mg Oral QHS   Continuous Infusions: . dextrose 5 % and 0.45% NaCl 75 mL/hr at 05/11/17 1200     Time spent: 30 minutes    Barton Dubois  [facility] Pager [phone]. If 7PM-7AM, please contact night-coverage at [email or web address], password [facility] 05/11/2017, 10:22 PM  LOS: 2 days

## 2017-05-12 DIAGNOSIS — K56600 Partial intestinal obstruction, unspecified as to cause: Secondary | ICD-10-CM

## 2017-05-12 LAB — CULTURE, BLOOD (ROUTINE X 2): SPECIAL REQUESTS: ADEQUATE

## 2017-05-12 MED ORDER — GABAPENTIN 300 MG PO CAPS
300.0000 mg | ORAL_CAPSULE | Freq: Two times a day (BID) | ORAL | Status: DC
  Administered 2017-05-12 – 2017-05-13 (×3): 300 mg via ORAL
  Filled 2017-05-12 (×3): qty 1

## 2017-05-12 NOTE — Care Management Important Message (Signed)
Important Message  Patient Details  Name: REAL CONA MRN: [id number] Date of Birth: [birth year]   Medicare Important Message Given:  Yes    Kerin Salen 05/12/2017, 10:49 AMImportant Message  Patient Details  Name: TAIVON HAROON MRN: [id number] Date of Birth: May 13, 1941   Medicare Important Message Given:  Yes    Kerin Salen 05/12/2017, 10:49 AM

## 2017-05-12 NOTE — Progress Notes (Signed)
PROGRESS NOTE    Derek [name]  NLG:[id number] DOB: July 25, 1941 DOA: 05/09/2017 PCP: Sharilyn Sites, MD   Brief Narrative:   76 yo male with hx of crohn's disease s/p ileostomy in Jan 2018 (scheudled for reverse next month) who came with cc of abdominal pain , diffuse, started lastnight w/o nuasea or vomiting but with increased iloestomy bag output w/o blood w/o fever/chills. In the ED, imaging showed that patient has P.SBO and surgery advised admitting to IM for observation. Patient denied any other complaints. He did report taking abx for "positive" blood cultures last week, he was taking Cipro and Flagyl then was asked to stop the former due to interaction with Amiodarone. So far his blood cultures have grown contaminant coag neg staph. Repeat cultures drawn and awaiting negative cultures for discharge.   Assessment & Plan:   Active Problems:   Partial small bowel obstruction (HCC)   Partial SBO:  SEC to adhesions.  Improved. And he is requesting regular diet.  Surgery on board and appreciate their recommendations.  Replete electrolytes as needed.    Paroxysmal atrial fibrillation:  Rate controlled.  Resume amiodarone and eliquis.    GERD PPI.    Metabolic acidosis : resolved.    Stage 3 CKD:  Creatinine stable.   Coag neg staph bacteremia and gram positive cocci bacteremia:  Repeat blood cultures done on the 10/8 and have been negative so far. If they are neg by am, plan for d/c home.    DVT prophylaxis:  Code Status: full code.  Family Communication: family at bedside. Disposition Plan: home in the next 24 hours if blood cultures are negative.    Consultants:   Surgery.   ID over the phone.    Procedures: (none.    Antimicrobials:none.    Subjective: Requesting gabapentin that he takes for his leg pain.   Objective: Vitals:   05/11/17 0935 05/11/17 1310 05/11/17 2049 05/12/17 0419  BP: 110/69 116/65 109/62 100/62  Pulse: 62 62 63 (!) 58    Resp: 18 18 18 18   Temp: 97.7 F (36.5 C) 97.9 F (36.6 C) 99.2 F (37.3 C) 97.7 F (36.5 C)  TempSrc: Oral Oral Oral Oral  SpO2: 100% 99% 94% 95%  Weight:      Height:        Intake/Output Summary (Last 24 hours) at 05/12/17 1513 Last data filed at 05/12/17 0800  Gross per 24 hour  Intake          3229.58 ml  Output              200 ml  Net          3029.58 ml   Filed Weights   05/09/17 1500  Weight: 87.5 kg (192 lb 14.4 oz)    Examination:  General exam: Appears calm and comfortable  Respiratory system: Clear to auscultation. Respiratory effort normal. Cardiovascular system: S1 & S2 heard, RRR. No JVD, murmurs, rubs, gallops or clicks. No pedal edema. Gastrointestinal system: Abdomen is nondistended, soft and nontender. No organomegaly or masses felt. Normal bowel sounds heard. Central nervous system: Alert and oriented. No focal neurological deficits. Extremities: Symmetric 5 x 5 power. Skin: No rashes, lesions or ulcers Psychiatry: Judgement and insight appear normal. Mood & affect appropriate.     Data Reviewed: I have personally reviewed following labs and imaging studies  CBC:  Recent Labs Lab 05/09/17 0548 05/10/17 0528 05/11/17 0507  WBC 4.2 4.8 3.8*  NEUTROABS 3.2  --   --  HGB 11.8* 11.7* 11.7*  HCT 36.2* 37.0* 36.3*  MCV 85.6 86.4 86.0  PLT 107* 110* 99*   Basic Metabolic Panel:  Recent Labs Lab 05/09/17 0548 05/10/17 0528 05/11/17 0507  NA 143 138 140  K 4.1 4.7 4.2  CL 107 99* 102  CO2 26 29 30   GLUCOSE 127* 107* 113*  BUN 12 12 12   CREATININE 1.55* 1.53* 1.53*  CALCIUM 8.9 8.5* 8.8*  MG  --   --  1.3*   GFR: Estimated Creatinine Clearance: 46.5 mL/min (A) (by C-G formula based on SCr of 1.53 mg/dL (H)). Liver Function Tests:  Recent Labs Lab 05/09/17 0548 05/10/17 0528  AST 29 27  ALT 16* 17  ALKPHOS 207* 178*  BILITOT 0.8 0.7  PROT 6.4* 6.1*  ALBUMIN 3.4* 3.3*    Recent Labs Lab 05/09/17 0548  LIPASE 35   No  results for input(s): AMMONIA in the last 168 hours. Coagulation Profile:  Recent Labs Lab 05/07/17 1000  INR 1.00   Cardiac Enzymes: No results for input(s): CKTOTAL, CKMB, CKMBINDEX, TROPONINI in the last 168 hours. BNP (last 3 results) No results for input(s): PROBNP in the last 8760 hours. HbA1C: No results for input(s): HGBA1C in the last 72 hours. CBG: No results for input(s): GLUCAP in the last 168 hours. Lipid Profile: No results for input(s): CHOL, HDL, LDLCALC, TRIG, CHOLHDL, LDLDIRECT in the last 72 hours. Thyroid Function Tests: No results for input(s): TSH, T4TOTAL, FREET4, T3FREE, THYROIDAB in the last 72 hours. Anemia Panel: No results for input(s): VITAMINB12, FOLATE, FERRITIN, TIBC, IRON, RETICCTPCT in the last 72 hours. Sepsis Labs: No results for input(s): PROCALCITON, LATICACIDVEN in the last 168 hours.  Recent Results (from the past 240 hour(s))  Culture, blood (routine x 2)     Status: Abnormal   Collection Time: 05/09/17  9:55 AM  Result Value Ref Range Status   Specimen Description BLOOD PICC LINE  Final   Special Requests   Final    BOTTLES DRAWN AEROBIC AND ANAEROBIC Blood Culture adequate volume   Culture  Setup Time   Final    GRAM POSITIVE COCCI IN CLUSTERS IN BOTH AEROBIC AND ANAEROBIC BOTTLES CRITICAL RESULT CALLED TO, READ BACK BY AND VERIFIED WITH: D WOFFORD,PHARMD AT 7902 05/10/17 BY L BENFIELD    Culture (A)  Final    STAPHYLOCOCCUS SPECIES (COAGULASE NEGATIVE) THE SIGNIFICANCE OF ISOLATING THIS ORGANISM FROM A SINGLE SET OF BLOOD CULTURES WHEN MULTIPLE SETS ARE DRAWN IS UNCERTAIN. PLEASE NOTIFY THE MICROBIOLOGY DEPARTMENT WITHIN ONE WEEK IF SPECIATION AND SENSITIVITIES ARE REQUIRED. Performed at [facility], [place] [address] [place], [place] [address]    Report Status 05/12/2017 FINAL  Final  Blood Culture ID Panel (Reflexed)     Status: Abnormal   Collection Time: 05/09/17  9:55 AM  Result Value Ref Range Status   Enterococcus  species NOT DETECTED NOT DETECTED Final   Listeria monocytogenes NOT DETECTED NOT DETECTED Final   Staphylococcus species DETECTED (A) NOT DETECTED Final    Comment: Methicillin (oxacillin) resistant coagulase negative staphylococcus. Possible blood culture contaminant (unless isolated from more than one blood culture draw or clinical case suggests pathogenicity). No antibiotic treatment is indicated for blood  culture contaminants. CRITICAL RESULT CALLED TO, READ BACK BY AND VERIFIED WITH: D WOFFORD,PHARMD AT 5329 05/10/17 BY L BENFIELD    Staphylococcus aureus NOT DETECTED NOT DETECTED Final   Methicillin resistance DETECTED (A) NOT DETECTED Final    Comment: CRITICAL RESULT CALLED TO, READ  BACK BY AND VERIFIED WITH: D WOFFORD,PHARMD AT 2707 05/10/17 BY L BENFIELD    Streptococcus species NOT DETECTED NOT DETECTED Final   Streptococcus agalactiae NOT DETECTED NOT DETECTED Final   Streptococcus pneumoniae NOT DETECTED NOT DETECTED Final   Streptococcus pyogenes NOT DETECTED NOT DETECTED Final   Acinetobacter baumannii NOT DETECTED NOT DETECTED Final   Enterobacteriaceae species NOT DETECTED NOT DETECTED Final   Enterobacter cloacae complex NOT DETECTED NOT DETECTED Final   Escherichia coli NOT DETECTED NOT DETECTED Final   Klebsiella oxytoca NOT DETECTED NOT DETECTED Final   Klebsiella pneumoniae NOT DETECTED NOT DETECTED Final   Proteus species NOT DETECTED NOT DETECTED Final   Serratia marcescens NOT DETECTED NOT DETECTED Final   Haemophilus influenzae NOT DETECTED NOT DETECTED Final   Neisseria meningitidis NOT DETECTED NOT DETECTED Final   Pseudomonas aeruginosa NOT DETECTED NOT DETECTED Final   Candida albicans NOT DETECTED NOT DETECTED Final   Candida glabrata NOT DETECTED NOT DETECTED Final   Candida krusei NOT DETECTED NOT DETECTED Final   Candida parapsilosis NOT DETECTED NOT DETECTED Final   Candida tropicalis NOT DETECTED NOT DETECTED Final    Comment: Performed at [facility], [place] [address] [place], [place] [address]  Culture, blood (routine x 2)     Status: None (Preliminary result)   Collection Time: 05/09/17 10:00 AM  Result Value Ref Range Status   Specimen Description BLOOD LEFT ANTECUBITAL  Final   Special Requests   Final    BOTTLES DRAWN AEROBIC AND ANAEROBIC Blood Culture adequate volume   Culture   Final    NO GROWTH 3 DAYS Performed at [facility], [place] [address] [place], [place] [address]    Report Status PENDING  Incomplete  Culture, blood (Routine X 2) w Reflex to ID Panel     Status: None (Preliminary result)   Collection Time: 05/10/17  6:29 PM  Result Value Ref Range Status   Specimen Description BLOOD LEFT ARM  Final   Special Requests   Final    BOTTLES DRAWN AEROBIC AND ANAEROBIC Blood Culture adequate volume   Culture   Final    NO GROWTH 2 DAYS Performed at [facility], [place] [address], [place], [place] [address]    Report Status PENDING  Incomplete  Culture, blood (Routine X 2) w Reflex to ID Panel     Status: None (Preliminary result)   Collection Time: 05/10/17  6:36 PM  Result Value Ref Range Status   Specimen Description BLOOD LEFT ARM  Final   Special Requests   Final    BOTTLES DRAWN AEROBIC AND ANAEROBIC Blood Culture adequate volume   Culture   Final    NO GROWTH 2 DAYS Performed at [facility], [place] [address] [place],  [address]    Report Status PENDING  Incomplete         Radiology Studies: No results found.      Scheduled Meds: . amiodarone  200 mg Oral Daily  . apixaban  5 mg Oral BID  . calcium carbonate  2 tablet Oral BID  . cycloSPORINE  1 drop Both Eyes BID  . gabapentin  300 mg Oral BID  . pantoprazole  40 mg Oral Q1200  . PARoxetine  20 mg Oral QHS  . polyvinyl alcohol  1 drop Both Eyes QID  . sodium bicarbonate  1,300 mg Oral BID  . terazosin  2 mg Oral QHS  . traZODone  100 mg Oral QHS   Continuous Infusions: . dextrose 5 % and 0.45% NaCl 100  mL/hr at 05/12/17 0950     LOS: 3 days    Time spent: 25 minutes.     Hosie Poisson, MD [facility] Pager [phone]  If 7PM-7AM, please contact night-coverage [email or web address] Password [email or web address] 05/12/2017, 3:13 PM

## 2017-05-12 NOTE — Care Management Note (Signed)
Case Management Note  Patient Details  Name: Derek [name] MRN: [id number] Date of Birth: 1941-02-20  Subjective/Objective: 76 y/o m admitted w/PSBO. Hx: ileostomy. Active w/[facility] HHRN-iv hydration;csw;HRI. From home.                   Action/Plan:d/c plan home w/HHC.   Expected Discharge Date:  05/11/17               Expected Discharge Plan:  [place]  In-House Referral:     Discharge planning Services  CM Consult  Post Acute Care Choice:  Home Health (Active w/[facility] HHRN-infusion/CSW) Choice offered to:     DME Arranged:    DME Agency:     HH Arranged:  RN, Social Work [employer] Agency:  [place]  Status of Service:  In process, will continue to follow  If discussed at Long Length of Stay Meetings, dates discussed:    Additional Comments:  Dessa Phi, RN 05/12/2017, 10:52 AM

## 2017-05-12 NOTE — Progress Notes (Signed)
[facility]:  [phone] General Surgery Progress Note   LOS: 3 days  POD -     Chief Complaint: Bowel obstruction  Assessment and Plan: 1.  SBO - better  He ate a bag of [employer] Patient, which probably was part of the problem.  Diet being advanced. 2.  End ileostomy  IVF dependent at home  The patient had a PICC until last Thursday.  But Dr. Jimmy Footman contacted me about Mr. Carpenter who grew out a gram + rods from [name] blood (drawn 04/26/2017).  He was worried about chronic line sepsis.  The plan was to remove his PICC - he had had a tunneled right IJ placed 05/07/2017 and place him on Cipro and Flagyl x 10 days.   There are some concerns with the Cipro and Amiodarone interaction.  He is on for reversal of the ileostomy 06/03/2017.  3. Chronic renal insufficiency -  Primary issue is managing his volume and output from his ileostomy  Followed by Dr. Lenna Sciara. Deterding q 3 months  4. ATRIAL FIBRILLATION, CONTROLLED (I48.91) Followed by Dr. Aundra Dubin for cardiology On Amiodarone Was on apixaban 5 mg BID  5. ASCENDING AORTIC ANEURYSM (I71.2) Impression: Followed by Dr. Servando Snare 6. History of Crohn's disesase Prior ileocecectomy around 31. He required a second operation. He had a fistula that took months to heal. He did not have straight forward surgery. 7. History of PE 8. History of Hep C - from blood transfusion during his bowel resection in the 1970's 9. Nephrolithiasis 10. Post tramatic stress syndrome - from service             On Paxil    Active Problems:   Partial small bowel obstruction (HCC)  Subjective:  Doing better.  No abdominal pain.  Wife at beside.  Objective:   Vitals:   05/11/17 2049 05/12/17 0419  BP: 109/62 100/62  Pulse: 63 (!) 58  Resp: 18 18  Temp: 99.2 F (37.3 C) 97.7 F (36.5 C)  SpO2: 94% 95%     Intake/Output from previous  day:  10/09 0701 - 10/10 0700 In: 3349.6 [P.O.:480; I.V.:2869.6] Out: 200 [Stool:200]  Intake/Output this shift:  No intake/output data recorded.   Physical Exam:   General: WN older WM who is alert and oriented.    HEENT: Normal. Pupils equal. .   Lungs: Clear   Abdomen: soft, ileostomy functioning   Lab Results:    Recent Labs  05/10/17 0528 05/11/17 0507  WBC 4.8 3.8*  HGB 11.7* 11.7*  HCT 37.0* 36.3*  PLT 110* 99*    BMET   Recent Labs  05/10/17 0528 05/11/17 0507  NA 138 140  K 4.7 4.2  CL 99* 102  CO2 29 30  GLUCOSE 107* 113*  BUN 12 12  CREATININE 1.53* 1.53*  CALCIUM 8.5* 8.8*    PT/INR  No results for input(s): LABPROT, INR in the last 72 hours.  ABG  No results for input(s): PHART, HCO3 in the last 72 hours.  Invalid input(s): PCO2, PO2   Studies/Results:  No results found.   Anti-infectives:   Anti-infectives    Start     Dose/Rate Route Frequency Ordered Stop   05/09/17 2200  ciprofloxacin (CIPRO) IVPB 400 mg  Status:  Discontinued     400 mg 200 mL/hr over 60 Minutes Intravenous Every 12 hours 05/09/17 0959 05/10/17 1820   05/09/17 1000  metroNIDAZOLE (FLAGYL) IVPB 500 mg  Status:  Discontinued     500 mg  100 mL/hr over 60 Minutes Intravenous Every 8 hours 05/09/17 0847 05/10/17 1820   05/09/17 0930  ciprofloxacin (CIPRO) IVPB 400 mg     400 mg 200 mL/hr over 60 Minutes Intravenous  Once 05/09/17 0916 05/09/17 1055      [name], MD, FACS Pager: [facility] Office: [phone] 05/12/2017

## 2017-05-12 NOTE — Progress Notes (Signed)
[facility]  Derek [name] is an active pt with [facility].  Pt was receiving IV hydration fluids at home with [facility] prior to this readmission.  Previous orders:  D5NS 1500 ml Daily over 6 hours with Weekly BMP.  [facility] will need these orders at DC to home tomorrow.  Thank you.  If patient discharges after hours, please call [phone].   Larry Sierras 05/12/2017, 10:03 AM

## 2017-05-13 MED ORDER — HEPARIN SOD (PORK) LOCK FLUSH 100 UNIT/ML IV SOLN
250.0000 [IU] | INTRAVENOUS | Status: AC | PRN
  Administered 2017-05-13: 250 [IU]

## 2017-05-13 NOTE — Care Management Note (Signed)
Case Management Note  Patient Details  Name: Derek [name] MRN: [id number] Date of Birth: 06-06-1941  Subjective/Objective:  [facility] HHRN iv infusion,CSW,also HRI-orders already placed.  No further CM needs.                Action/Plan:d/c home w/HHC/HRI   Expected Discharge Date:  05/13/17               Expected Discharge Plan:  [place]  In-House Referral:     Discharge planning Services  CM Consult  Post Acute Care Choice:  Home Health (Active w/[facility] HHRN-infusion/CSW) Choice offered to:     DME Arranged:    DME Agency:  [place]:  RN, Social Work [employer] Agency:  [place]  Status of Service:  In process, will continue to follow  If discussed at Long Length of Stay Meetings, dates discussed:    Additional Comments:  Dessa Phi, RN 05/13/2017, 10:45 AM

## 2017-05-13 NOTE — Discharge Summary (Signed)
Physician Discharge Summary  Derek [name] HYW:[id number] DOB: [birth year] DOA: 05/09/2017  PCP: Sharilyn Sites, MD  Admit date: 05/09/2017 Discharge date: 05/13/2017  Admitted From: Home.  Disposition:  Home.   Recommendations for Outpatient Follow-up:  1. Follow up with PCP in 1-2 weeks 2. Please obtain BMP/CBC in one week 3. Please follow up with surgery as recommended.   Home Health:yes   Discharge Condition:stable.  CODE STATUS:full code.  Diet recommendation: Heart Healthy  Brief/Interim Summary:  76 yo male with hx of crohn's disease s/p ileostomy in Jan 2018 (scheudled for reverse next month) who came with cc of abdominal pain , diffuse, started lastnight w/o nuasea or vomiting but with increased iloestomy bag output w/o blood w/o fever/chills. In the ED, imaging showed that patient has P.SBO and surgery advised admitting to IM for observation. Patient denied any other complaints. He did report taking abx for "positive" blood cultures last week, he was taking Cipro and Flagyl then was asked to stop the former due to interaction with Amiodarone. So far his blood cultures have grown contaminant coag neg staph. Repeat cultures drawn and negative so far.   Discharge Diagnoses:  Active Problems:   Partial small bowel obstruction (HCC) Partial SBO:  SEC to adhesions.  Improved and on regular diet and tolerating well.  Surgery on board and appreciate their recommendations.  Replete electrolytes as needed.    Paroxysmal atrial fibrillation:  Rate controlled.  Resume amiodarone and eliquis.    GERD PPI.    Metabolic acidosis : resolved.    Stage 3 CKD:  Creatinine stable.   Coag neg staph bacteremia and gram positive cocci bacteremia:  Repeat blood cultures done on the 10/8 and have been negative so far.     Discharge Instructions  Discharge Instructions    Diet - low sodium heart healthy    Complete by:  As directed    Discharge instructions     Complete by:  As directed    Please follow up with PCP AND surgery as recommended.     Allergies as of 05/13/2017      Reactions   Lorazepam Other (See Comments)   Reaction:  Hallucinations    Humira [adalimumab] Other (See Comments)   Pt states that he got pancreatitis.        Medication List    STOP taking these medications   ciprofloxacin 500 MG tablet Commonly known as:  CIPRO   metroNIDAZOLE 500 MG tablet Commonly known as:  FLAGYL     TAKE these medications   Acidophilus Lactobacillus Caps Take 1 capsule by mouth daily.   amiodarone 200 MG tablet Commonly known as:  PACERONE Take 1 tablet (200 mg total) by mouth daily.   calcium carbonate 500 MG chewable tablet Commonly known as:  TUMS - dosed in mg elemental calcium Chew 2 tablets by mouth 2 (two) times daily.   carboxymethylcellulose 0.5 % Soln Commonly known as:  REFRESH PLUS Place 1 drop into both eyes 4 (four) times daily.   cholecalciferol 1000 units tablet Commonly known as:  VITAMIN D Take 2,000 Units by mouth daily.   cyanocobalamin 1000 MCG/ML injection Commonly known as:  (VITAMIN B-12) Inject 1,000 mcg into the muscle every 14 (fourteen) days.   cycloSPORINE 0.05 % ophthalmic emulsion Commonly known as:  RESTASIS Place 1 drop into both eyes 2 (two) times daily.   ELIQUIS 5 MG Tabs tablet Generic drug:  apixaban Take 1 tablet (5 mg total) by mouth 2 (two) times  daily.   ferrous sulfate 325 (65 FE) MG tablet Take 325 mg by mouth daily with breakfast.   loperamide 2 MG capsule Commonly known as:  IMODIUM Take 4 mg by mouth 2 (two) times daily.   MAGNESIUM-OXIDE 400 (241.3 Mg) MG tablet Generic drug:  magnesium oxide Take 800 mg by mouth 2 (two) times daily.   multivitamin with minerals Tabs tablet Take 1 tablet by mouth daily.   omeprazole 20 MG capsule Commonly known as:  PRILOSEC Take 20 mg by mouth daily.   PARoxetine 40 MG tablet Commonly known as:  PAXIL Take 20 mg by  mouth at bedtime.   polycarbophil 625 MG tablet Commonly known as:  FIBERCON Take 625 mg by mouth 2 (two) times daily.   PRESCRIPTION MEDICATION Inject 150 mLs into the vein See admin instructions. Dextrose 5% in normal saline (0.9%) administered at 115m/hr over 6 hours   sodium bicarbonate 650 MG tablet Take 1,300 mg by mouth 2 (two) times daily.   terazosin 2 MG capsule Commonly known as:  HYTRIN Take 2 mg by mouth at bedtime.   traZODone 100 MG tablet Commonly known as:  DESYREL Take 100 mg by mouth at bedtime.      Follow-up Information    [facility] Follow [facility].   Why:  HH nursing-iv infusion/csw Contact information: [phone]        GSharilyn Sites MD. Schedule an appointment as soon as possible for a visit in 1 week(s).   Specialty:  Family Medicine Contact information: [address] [place] [phone]         Allergies  Allergen Reactions  . Lorazepam Other (See Comments)    Reaction:  Hallucinations   . Humira [Adalimumab] Other (See Comments)    Pt states that he got pancreatitis.      Consultations:  Surgery.    Procedures/Studies: Ct Abdomen Pelvis Wo Contrast  Result Date: 05/09/2017 CLINICAL DATA:  Mid abdominal pain with history of bowel obstruction and ischemic bowel with osteotomy. Assess for recurrent obstruction. EXAM: CT ABDOMEN AND PELVIS WITHOUT CONTRAST TECHNIQUE: Multidetector CT imaging of the abdomen and pelvis was performed following the standard protocol without IV contrast. COMPARISON:  03/08/2017 FINDINGS: Lower chest: Mild scarring. No active process. No pleural or pericardial fluid. Hepatobiliary: Liver parenchyma is normal. Previous cholecystectomy. Ductal system within normal limits given previous cholecystectomy. Pancreas: Normal Spleen: Persistent moderate splenomegaly with length of 17 cm. No focal lesion. Adrenals/Urinary Tract:  Adrenal glands are normal. Bilateral small nonobstructing stones, right more numerous than left. Small renal cysts. No hydronephrosis. Renal atrophy. Stomach/Bowel: Previous colectomy with ostomy to the right of midline. Residual left colon is collapsed. This does contain some previously administered contrast. There dilated loops of small intestine consistent with partial small bowel obstruction. Vascular/Lymphatic: Aortic atherosclerosis. No aneurysm. IVC is normal. No retroperitoneal adenopathy. Reproductive: Normal Other: No free fluid or air. Musculoskeletal: Ordinary lumbar degenerative changes. IMPRESSION: Previous bowel resection with right para median ostomy. Partial small bowel obstruction with dilated residual small intestine. No free fluid or air. Chronic splenomegaly. Chronic renal atrophy with nonobstructing small calculi and small cysts. Aortic atherosclerosis. Electronically Signed   By: MNelson ChimesM.D.   On: 05/09/2017 07:17   Dg Chest 1 View  Result Date: 05/07/2017 CLINICAL DATA:  PICC placement EXAM: CHEST 1 VIEW COMPARISON:  01/28/2017 chest radiograph. FINDINGS: Right internal jugular central venous catheter terminates in the upper third of the  superior vena cava. Partially visualized left shoulder arthroplasty. Stable cardiomediastinal silhouette with top-normal heart size and aortic atherosclerosis. No pneumothorax. No pleural effusion. Low lung volumes. No pulmonary edema. Mild bibasilar scarring versus atelectasis. No acute consolidative airspace disease. IMPRESSION: 1. Right internal jugular central venous catheter terminates in the upper third of the superior vena cava. No pneumothorax. 2. Low lung volumes with mild bibasilar scarring versus atelectasis. Electronically Signed   By: Ilona Sorrel M.D.   On: 05/07/2017 18:38   Ir Fluoro Guide Cv Line Right  Result Date: 05/07/2017 INDICATION: Small bowel ileostomy, chronic dehydration, access for IV hydration EXAM: ULTRASOUND AND  FLUOROSCOPIC GUIDED RIGHT IJ TUNNELED SINGLE-LUMEN POWER PICC LINE INSERTION MEDICATIONS: 1% lidocaine local CONTRAST:  None FLUOROSCOPY TIME:  18 seconds (2 mGy) COMPLICATIONS: None immediate. TECHNIQUE: The procedure, risks, benefits, and alternatives were explained to the patient and informed written consent was obtained. A timeout was performed prior to the initiation of the procedure. The right neck and chest were prepped with chlorhexidine in a sterile fashion, and a sterile drape was applied covering the operative field. Maximum barrier sterile technique with sterile gowns and gloves were used for the procedure. A timeout was performed prior to the initiation of the procedure. Local anesthesia was provided with 1% lidocaine. Under direct ultrasound guidance, the right internal jugular vein was accessed with a micropuncture kit after the overlying soft tissues were anesthetized with 1% lidocaine. An ultrasound image was saved for documentation purposes. A guidewire was advanced to the level of the superior caval-atrial junction for measurement purposes and the PICC line was cut to length. In the right infraclavicular chest, the PICC line was tunneled subcutaneously to the venotomy site. A peel-away sheath was placed and a 24 cm, 5 French, single-lumen tunneled power PICC linewas inserted to level of the superior caval-atrial junction. A post procedure spot fluoroscopic was obtained. The catheter easily aspirated and flushed and was sutured in place. Catheter secured with Ethilon suture. Venotomy site closed with derma bond. A dressing was placed. The patient tolerated the procedure well without immediate post procedural complication. FINDINGS: After catheter placement, the tip lies within the superior cavoatrial junction. The catheter aspirates and flushes normally and is ready for immediate use. IMPRESSION: Successful ultrasound and fluoroscopic guided placement of a right internal jugular vein approach, 24  cm, 5 French, single-lumen tunneled powerPICC with tip at the superior caval-atrial junction. The PICC line is ready for immediate use. Electronically Signed   By: Jerilynn Mages.  Shick M.D.   On: 05/07/2017 12:46   Ir US Guide Vasc Access Right  Result Date: 05/07/2017 INDICATION: Small bowel ileostomy, chronic dehydration, access for IV hydration EXAM: ULTRASOUND AND FLUOROSCOPIC GUIDED RIGHT IJ TUNNELED SINGLE-LUMEN POWER PICC LINE INSERTION MEDICATIONS: 1% lidocaine local CONTRAST:  None FLUOROSCOPY TIME:  18 seconds (2 mGy) COMPLICATIONS: None immediate. TECHNIQUE: The procedure, risks, benefits, and alternatives were explained to the patient and informed written consent was obtained. A timeout was performed prior to the initiation of the procedure. The right neck and chest were prepped with chlorhexidine in a sterile fashion, and a sterile drape was applied covering the operative field. Maximum barrier sterile technique with sterile gowns and gloves were used for the procedure. A timeout was performed prior to the initiation of the procedure. Local anesthesia was provided with 1% lidocaine. Under direct ultrasound guidance, the right internal jugular vein was accessed with a micropuncture kit after the overlying soft tissues were anesthetized with 1% lidocaine. An ultrasound image was saved  for documentation purposes. A guidewire was advanced to the level of the superior caval-atrial junction for measurement purposes and the PICC line was cut to length. In the right infraclavicular chest, the PICC line was tunneled subcutaneously to the venotomy site. A peel-away sheath was placed and a 24 cm, 5 French, single-lumen tunneled power PICC linewas inserted to level of the superior caval-atrial junction. A post procedure spot fluoroscopic was obtained. The catheter easily aspirated and flushed and was sutured in place. Catheter secured with Ethilon suture. Venotomy site closed with derma bond. A dressing was placed. The  patient tolerated the procedure well without immediate post procedural complication. FINDINGS: After catheter placement, the tip lies within the superior cavoatrial junction. The catheter aspirates and flushes normally and is ready for immediate use. IMPRESSION: Successful ultrasound and fluoroscopic guided placement of a right internal jugular vein approach, 24 cm, 5 French, single-lumen tunneled powerPICC with tip at the superior caval-atrial junction. The PICC line is ready for immediate use. Electronically Signed   By: Jerilynn Mages.  Shick M.D.   On: 05/07/2017 12:46       Subjective: No new complaints.   Discharge Exam: Vitals:   05/12/17 2006 05/13/17 0445  BP: (!) 109/58 (!) 90/57  Pulse: (!) 59 60  Resp: 18 18  Temp: 98 F (36.7 C) 97.7 F (36.5 C)  SpO2: 97% 97%   Vitals:   05/12/17 0419 05/12/17 1500 05/12/17 2006 05/13/17 0445  BP: 100/62 115/60 (!) 109/58 (!) 90/57  Pulse: (!) 58 60 (!) 59 60  Resp: 18 18 18 18   Temp: 97.7 F (36.5 C) 98.5 F (36.9 C) 98 F (36.7 C) 97.7 F (36.5 C)  TempSrc: Oral Oral Oral Oral  SpO2: 95% 95% 97% 97%  Weight:      Height:        General: Pt is alert, awake, not in acute distress Cardiovascular: RRR, S1/S2 +, no rubs, no gallops Respiratory: CTA bilaterally, no wheezing, no rhonchi Abdominal: Soft, NT, ND, bowel sounds + Extremities: no edema, no cyanosis    The results of significant diagnostics from this hospitalization (including imaging, microbiology, ancillary and laboratory) are listed below for reference.     Microbiology: Recent Results (from the past 240 hour(s))  Culture, blood (routine x 2)     Status: Abnormal   Collection Time: 05/09/17  9:55 AM  Result Value Ref Range Status   Specimen Description BLOOD PICC LINE  Final   Special Requests   Final    BOTTLES DRAWN AEROBIC AND ANAEROBIC Blood Culture adequate volume   Culture  Setup Time   Final    GRAM POSITIVE COCCI IN CLUSTERS IN BOTH AEROBIC AND ANAEROBIC  BOTTLES CRITICAL RESULT CALLED TO, READ BACK BY AND VERIFIED WITH: D WOFFORD,PHARMD AT 1740 05/10/17 BY L BENFIELD    Culture (A)  Final    STAPHYLOCOCCUS SPECIES (COAGULASE NEGATIVE) THE SIGNIFICANCE OF ISOLATING THIS ORGANISM FROM A SINGLE SET OF BLOOD CULTURES WHEN MULTIPLE SETS ARE DRAWN IS UNCERTAIN. PLEASE NOTIFY THE MICROBIOLOGY DEPARTMENT WITHIN ONE WEEK IF SPECIATION AND SENSITIVITIES ARE REQUIRED. Performed at [facility], [place] [address] [place], [place] [address]    Report Status 05/12/2017 FINAL  Final  Blood Culture ID Panel (Reflexed)     Status: Abnormal   Collection Time: 05/09/17  9:55 AM  Result Value Ref Range Status   Enterococcus species NOT DETECTED NOT DETECTED Final   Listeria monocytogenes NOT DETECTED NOT DETECTED Final   Staphylococcus species DETECTED (A)  NOT DETECTED Final    Comment: Methicillin (oxacillin) resistant coagulase negative staphylococcus. Possible blood culture contaminant (unless isolated from more than one blood culture draw or clinical case suggests pathogenicity). No antibiotic treatment is indicated for blood  culture contaminants. CRITICAL RESULT CALLED TO, READ BACK BY AND VERIFIED WITH: D WOFFORD,PHARMD AT 4163 05/10/17 BY L BENFIELD    Staphylococcus aureus NOT DETECTED NOT DETECTED Final   Methicillin resistance DETECTED (A) NOT DETECTED Final    Comment: CRITICAL RESULT CALLED TO, READ BACK BY AND VERIFIED WITH: D WOFFORD,PHARMD AT 8453 05/10/17 BY L BENFIELD    Streptococcus species NOT DETECTED NOT DETECTED Final   Streptococcus agalactiae NOT DETECTED NOT DETECTED Final   Streptococcus pneumoniae NOT DETECTED NOT DETECTED Final   Streptococcus pyogenes NOT DETECTED NOT DETECTED Final   Acinetobacter baumannii NOT DETECTED NOT DETECTED Final   Enterobacteriaceae species NOT DETECTED NOT DETECTED Final   Enterobacter cloacae complex NOT DETECTED NOT DETECTED Final   Escherichia coli NOT DETECTED NOT DETECTED Final    Klebsiella oxytoca NOT DETECTED NOT DETECTED Final   Klebsiella pneumoniae NOT DETECTED NOT DETECTED Final   Proteus species NOT DETECTED NOT DETECTED Final   Serratia marcescens NOT DETECTED NOT DETECTED Final   Haemophilus influenzae NOT DETECTED NOT DETECTED Final   Neisseria meningitidis NOT DETECTED NOT DETECTED Final   Pseudomonas aeruginosa NOT DETECTED NOT DETECTED Final   Candida albicans NOT DETECTED NOT DETECTED Final   Candida glabrata NOT DETECTED NOT DETECTED Final   Candida krusei NOT DETECTED NOT DETECTED Final   Candida parapsilosis NOT DETECTED NOT DETECTED Final   Candida tropicalis NOT DETECTED NOT DETECTED Final    Comment: Performed at [facility], [place]. [address], [place], [place] [address]  Culture, blood (routine x 2)     Status: None (Preliminary result)   Collection Time: 05/09/17 10:00 AM  Result Value Ref Range Status   Specimen Description BLOOD LEFT ANTECUBITAL  Final   Special Requests   Final    BOTTLES DRAWN AEROBIC AND ANAEROBIC Blood Culture adequate volume   Culture   Final    NO GROWTH 3 DAYS Performed at [facility], [place] [address], [place], [place] [address]    Report Status PENDING  Incomplete  Culture, blood (Routine X 2) w Reflex to ID Panel     Status: None (Preliminary result)   Collection Time: 05/10/17  6:29 PM  Result Value Ref Range Status   Specimen Description BLOOD LEFT ARM  Final   Special Requests   Final    BOTTLES DRAWN AEROBIC AND ANAEROBIC Blood Culture adequate volume   Culture   Final    NO GROWTH 2 DAYS Performed at [facility], [place] [address] [place], [place] [address]    Report Status PENDING  Incomplete  Culture, blood (Routine X 2) w Reflex to ID Panel     Status: None (Preliminary result)   Collection Time: 05/10/17  6:36 PM  Result Value Ref Range Status   Specimen Description BLOOD LEFT ARM  Final   Special Requests   Final    BOTTLES DRAWN AEROBIC AND ANAEROBIC Blood Culture adequate  volume   Culture   Final    NO GROWTH 2 DAYS Performed at [facility], [place] [address] [place],  [address]    Report Status PENDING  Incomplete     Labs: BNP (last 3 results) No results for input(s): BNP in the last 8760 hours. Basic  Metabolic Panel:  Recent Labs Lab 05/09/17 0548 05/10/17 0528 05/11/17 0507  NA 143 138 140  K 4.1 4.7 4.2  CL 107 99* 102  CO2 26 29 30   GLUCOSE 127* 107* 113*  BUN 12 12 12   CREATININE 1.55* 1.53* 1.53*  CALCIUM 8.9 8.5* 8.8*  MG  --   --  1.3*   Liver Function Tests:  Recent Labs Lab 05/09/17 0548 05/10/17 0528  AST 29 27  ALT 16* 17  ALKPHOS 207* 178*  BILITOT 0.8 0.7  PROT 6.4* 6.1*  ALBUMIN 3.4* 3.3*    Recent Labs Lab 05/09/17 0548  LIPASE 35   No results for input(s): AMMONIA in the last 168 hours. CBC:  Recent Labs Lab 05/09/17 0548 05/10/17 0528 05/11/17 0507  WBC 4.2 4.8 3.8*  NEUTROABS 3.2  --   --   HGB 11.8* 11.7* 11.7*  HCT 36.2* 37.0* 36.3*  MCV 85.6 86.4 86.0  PLT 107* 110* 99*   Cardiac Enzymes: No results for input(s): CKTOTAL, CKMB, CKMBINDEX, TROPONINI in the last 168 hours. BNP: Invalid input(s): POCBNP CBG: No results for input(s): GLUCAP in the last 168 hours. D-Dimer No results for input(s): DDIMER in the last 72 hours. Hgb A1c No results for input(s): HGBA1C in the last 72 hours. Lipid Profile No results for input(s): CHOL, HDL, LDLCALC, TRIG, CHOLHDL, LDLDIRECT in the last 72 hours. Thyroid function studies No results for input(s): TSH, T4TOTAL, T3FREE, THYROIDAB in the last 72 hours.  Invalid input(s): FREET3 Anemia work up No results for input(s): VITAMINB12, FOLATE, FERRITIN, TIBC, IRON, RETICCTPCT in the last 72 hours. Urinalysis    Component Value Date/Time   COLORURINE YELLOW 10/31/2016 0705   APPEARANCEUR CLEAR 10/31/2016 0705   LABSPEC 1.013 10/31/2016 0705   PHURINE 5.0 10/31/2016 0705   GLUCOSEU NEGATIVE 10/31/2016 0705   HGBUR NEGATIVE 10/31/2016  0705   BILIRUBINUR NEGATIVE 10/31/2016 0705   KETONESUR NEGATIVE 10/31/2016 0705   PROTEINUR NEGATIVE 10/31/2016 0705   NITRITE NEGATIVE 10/31/2016 0705   LEUKOCYTESUR NEGATIVE 10/31/2016 0705   Sepsis Labs Invalid input(s): PROCALCITONIN,  WBC,  LACTICIDVEN Microbiology Recent Results (from the past 240 hour(s))  Culture, blood (routine x 2)     Status: Abnormal   Collection Time: 05/09/17  9:55 AM  Result Value Ref Range Status   Specimen Description BLOOD PICC LINE  Final   Special Requests   Final    BOTTLES DRAWN AEROBIC AND ANAEROBIC Blood Culture adequate volume   Culture  Setup Time   Final    GRAM POSITIVE COCCI IN CLUSTERS IN BOTH AEROBIC AND ANAEROBIC BOTTLES CRITICAL RESULT CALLED TO, READ BACK BY AND VERIFIED WITH: D WOFFORD,PHARMD AT 7035 05/10/17 BY L BENFIELD    Culture (A)  Final    STAPHYLOCOCCUS SPECIES (COAGULASE NEGATIVE) THE SIGNIFICANCE OF ISOLATING THIS ORGANISM FROM A SINGLE SET OF BLOOD CULTURES WHEN MULTIPLE SETS ARE DRAWN IS UNCERTAIN. PLEASE NOTIFY THE MICROBIOLOGY DEPARTMENT WITHIN ONE WEEK IF SPECIATION AND SENSITIVITIES ARE REQUIRED. Performed at [facility], [place] [address] [place], [place] [address]    Report Status 05/12/2017 FINAL  Final  Blood Culture ID Panel (Reflexed)     Status: Abnormal   Collection Time: 05/09/17  9:55 AM  Result Value Ref Range Status   Enterococcus species NOT DETECTED NOT DETECTED Final   Listeria monocytogenes NOT DETECTED NOT DETECTED Final   Staphylococcus species DETECTED (A) NOT DETECTED Final    Comment: Methicillin (oxacillin) resistant coagulase negative staphylococcus. Possible blood culture  contaminant (unless isolated from more than one blood culture draw or clinical case suggests pathogenicity). No antibiotic treatment is indicated for blood  culture contaminants. CRITICAL RESULT CALLED TO, READ BACK BY AND VERIFIED WITH: D WOFFORD,PHARMD AT 6812 05/10/17 BY L BENFIELD    Staphylococcus aureus NOT  DETECTED NOT DETECTED Final   Methicillin resistance DETECTED (A) NOT DETECTED Final    Comment: CRITICAL RESULT CALLED TO, READ BACK BY AND VERIFIED WITH: D WOFFORD,PHARMD AT 7517 05/10/17 BY L BENFIELD    Streptococcus species NOT DETECTED NOT DETECTED Final   Streptococcus agalactiae NOT DETECTED NOT DETECTED Final   Streptococcus pneumoniae NOT DETECTED NOT DETECTED Final   Streptococcus pyogenes NOT DETECTED NOT DETECTED Final   Acinetobacter baumannii NOT DETECTED NOT DETECTED Final   Enterobacteriaceae species NOT DETECTED NOT DETECTED Final   Enterobacter cloacae complex NOT DETECTED NOT DETECTED Final   Escherichia coli NOT DETECTED NOT DETECTED Final   Klebsiella oxytoca NOT DETECTED NOT DETECTED Final   Klebsiella pneumoniae NOT DETECTED NOT DETECTED Final   Proteus species NOT DETECTED NOT DETECTED Final   Serratia marcescens NOT DETECTED NOT DETECTED Final   Haemophilus influenzae NOT DETECTED NOT DETECTED Final   Neisseria meningitidis NOT DETECTED NOT DETECTED Final   Pseudomonas aeruginosa NOT DETECTED NOT DETECTED Final   Candida albicans NOT DETECTED NOT DETECTED Final   Candida glabrata NOT DETECTED NOT DETECTED Final   Candida krusei NOT DETECTED NOT DETECTED Final   Candida parapsilosis NOT DETECTED NOT DETECTED Final   Candida tropicalis NOT DETECTED NOT DETECTED Final    Comment: Performed at [facility], [place]. [address], [place], [place] [address]  Culture, blood (routine x 2)     Status: None (Preliminary result)   Collection Time: 05/09/17 10:00 AM  Result Value Ref Range Status   Specimen Description BLOOD LEFT ANTECUBITAL  Final   Special Requests   Final    BOTTLES DRAWN AEROBIC AND ANAEROBIC Blood Culture adequate volume   Culture   Final    NO GROWTH 3 DAYS Performed at [facility], [place] [address] [place], [place] [address]    Report Status PENDING  Incomplete  Culture, blood (Routine X 2) w Reflex to ID Panel     Status: None  (Preliminary result)   Collection Time: 05/10/17  6:29 PM  Result Value Ref Range Status   Specimen Description BLOOD LEFT ARM  Final   Special Requests   Final    BOTTLES DRAWN AEROBIC AND ANAEROBIC Blood Culture adequate volume   Culture   Final    NO GROWTH 2 DAYS Performed at [facility], [place] [address] [place], [place] [address]    Report Status PENDING  Incomplete  Culture, blood (Routine X 2) w Reflex to ID Panel     Status: None (Preliminary result)   Collection Time: 05/10/17  6:36 PM  Result Value Ref Range Status   Specimen Description BLOOD LEFT ARM  Final   Special Requests   Final    BOTTLES DRAWN AEROBIC AND ANAEROBIC Blood Culture adequate volume   Culture   Final    NO GROWTH 2 DAYS Performed at [facility], [place] [address], [place],  [address]    Report Status PENDING  Incomplete     Time coordinating discharge: Over 30 minutes  SIGNED:   Hosie Poisson, MD  [facility] Hospitalists 05/13/2017, 10:42 AM Pager   If 7PM-7AM, please contact night-coverage [email or web address] Password [email or web address]

## 2017-05-14 DIAGNOSIS — Z932 Ileostomy status: Secondary | ICD-10-CM | POA: Diagnosis not present

## 2017-05-14 DIAGNOSIS — M109 Gout, unspecified: Secondary | ICD-10-CM | POA: Diagnosis not present

## 2017-05-14 DIAGNOSIS — R7881 Bacteremia: Secondary | ICD-10-CM | POA: Diagnosis not present

## 2017-05-14 DIAGNOSIS — G629 Polyneuropathy, unspecified: Secondary | ICD-10-CM | POA: Diagnosis not present

## 2017-05-14 DIAGNOSIS — F419 Anxiety disorder, unspecified: Secondary | ICD-10-CM | POA: Diagnosis not present

## 2017-05-14 DIAGNOSIS — B9562 Methicillin resistant Staphylococcus aureus infection as the cause of diseases classified elsewhere: Secondary | ICD-10-CM | POA: Diagnosis not present

## 2017-05-14 DIAGNOSIS — L719 Rosacea, unspecified: Secondary | ICD-10-CM | POA: Diagnosis not present

## 2017-05-14 DIAGNOSIS — Z7901 Long term (current) use of anticoagulants: Secondary | ICD-10-CM | POA: Diagnosis not present

## 2017-05-14 DIAGNOSIS — K219 Gastro-esophageal reflux disease without esophagitis: Secondary | ICD-10-CM | POA: Diagnosis not present

## 2017-05-14 DIAGNOSIS — Z452 Encounter for adjustment and management of vascular access device: Secondary | ICD-10-CM | POA: Diagnosis not present

## 2017-05-14 DIAGNOSIS — Z87891 Personal history of nicotine dependence: Secondary | ICD-10-CM | POA: Diagnosis not present

## 2017-05-14 DIAGNOSIS — Z5181 Encounter for therapeutic drug level monitoring: Secondary | ICD-10-CM | POA: Diagnosis not present

## 2017-05-14 DIAGNOSIS — F329 Major depressive disorder, single episode, unspecified: Secondary | ICD-10-CM | POA: Diagnosis not present

## 2017-05-14 DIAGNOSIS — K50912 Crohn's disease, unspecified, with intestinal obstruction: Secondary | ICD-10-CM | POA: Diagnosis not present

## 2017-05-14 DIAGNOSIS — D631 Anemia in chronic kidney disease: Secondary | ICD-10-CM | POA: Diagnosis not present

## 2017-05-14 DIAGNOSIS — F4310 Post-traumatic stress disorder, unspecified: Secondary | ICD-10-CM | POA: Diagnosis not present

## 2017-05-14 DIAGNOSIS — I480 Paroxysmal atrial fibrillation: Secondary | ICD-10-CM | POA: Diagnosis not present

## 2017-05-14 DIAGNOSIS — N183 Chronic kidney disease, stage 3 (moderate): Secondary | ICD-10-CM | POA: Diagnosis not present

## 2017-05-14 LAB — CULTURE, BLOOD (ROUTINE X 2)
Culture: NO GROWTH
Special Requests: ADEQUATE

## 2017-05-15 LAB — CULTURE, BLOOD (ROUTINE X 2)
CULTURE: NO GROWTH
CULTURE: NO GROWTH
SPECIAL REQUESTS: ADEQUATE
Special Requests: ADEQUATE

## 2017-05-17 DIAGNOSIS — I480 Paroxysmal atrial fibrillation: Secondary | ICD-10-CM | POA: Diagnosis not present

## 2017-05-17 DIAGNOSIS — B9562 Methicillin resistant Staphylococcus aureus infection as the cause of diseases classified elsewhere: Secondary | ICD-10-CM | POA: Diagnosis not present

## 2017-05-17 DIAGNOSIS — K50912 Crohn's disease, unspecified, with intestinal obstruction: Secondary | ICD-10-CM | POA: Diagnosis not present

## 2017-05-17 DIAGNOSIS — D631 Anemia in chronic kidney disease: Secondary | ICD-10-CM | POA: Diagnosis not present

## 2017-05-17 DIAGNOSIS — N183 Chronic kidney disease, stage 3 (moderate): Secondary | ICD-10-CM | POA: Diagnosis not present

## 2017-05-17 DIAGNOSIS — R7881 Bacteremia: Secondary | ICD-10-CM | POA: Diagnosis not present

## 2017-05-18 DIAGNOSIS — D631 Anemia in chronic kidney disease: Secondary | ICD-10-CM | POA: Diagnosis not present

## 2017-05-18 DIAGNOSIS — K50912 Crohn's disease, unspecified, with intestinal obstruction: Secondary | ICD-10-CM | POA: Diagnosis not present

## 2017-05-18 DIAGNOSIS — I480 Paroxysmal atrial fibrillation: Secondary | ICD-10-CM | POA: Diagnosis not present

## 2017-05-18 DIAGNOSIS — B9562 Methicillin resistant Staphylococcus aureus infection as the cause of diseases classified elsewhere: Secondary | ICD-10-CM | POA: Diagnosis not present

## 2017-05-18 DIAGNOSIS — N183 Chronic kidney disease, stage 3 (moderate): Secondary | ICD-10-CM | POA: Diagnosis not present

## 2017-05-18 DIAGNOSIS — R7881 Bacteremia: Secondary | ICD-10-CM | POA: Diagnosis not present

## 2017-05-24 DIAGNOSIS — K50912 Crohn's disease, unspecified, with intestinal obstruction: Secondary | ICD-10-CM | POA: Diagnosis not present

## 2017-05-24 DIAGNOSIS — D631 Anemia in chronic kidney disease: Secondary | ICD-10-CM | POA: Diagnosis not present

## 2017-05-24 DIAGNOSIS — B9562 Methicillin resistant Staphylococcus aureus infection as the cause of diseases classified elsewhere: Secondary | ICD-10-CM | POA: Diagnosis not present

## 2017-05-24 DIAGNOSIS — N183 Chronic kidney disease, stage 3 (moderate): Secondary | ICD-10-CM | POA: Diagnosis not present

## 2017-05-24 DIAGNOSIS — R7881 Bacteremia: Secondary | ICD-10-CM | POA: Diagnosis not present

## 2017-05-24 DIAGNOSIS — I480 Paroxysmal atrial fibrillation: Secondary | ICD-10-CM | POA: Diagnosis not present

## 2017-05-27 ENCOUNTER — Other Ambulatory Visit: Payer: Self-pay | Admitting: Cardiothoracic Surgery

## 2017-05-27 DIAGNOSIS — I712 Thoracic aortic aneurysm, without rupture, unspecified: Secondary | ICD-10-CM

## 2017-06-01 DIAGNOSIS — R7881 Bacteremia: Secondary | ICD-10-CM | POA: Diagnosis not present

## 2017-06-01 DIAGNOSIS — N183 Chronic kidney disease, stage 3 (moderate): Secondary | ICD-10-CM | POA: Diagnosis not present

## 2017-06-01 DIAGNOSIS — B9562 Methicillin resistant Staphylococcus aureus infection as the cause of diseases classified elsewhere: Secondary | ICD-10-CM | POA: Diagnosis not present

## 2017-06-01 DIAGNOSIS — K50912 Crohn's disease, unspecified, with intestinal obstruction: Secondary | ICD-10-CM | POA: Diagnosis not present

## 2017-06-01 DIAGNOSIS — D631 Anemia in chronic kidney disease: Secondary | ICD-10-CM | POA: Diagnosis not present

## 2017-06-01 DIAGNOSIS — I480 Paroxysmal atrial fibrillation: Secondary | ICD-10-CM | POA: Diagnosis not present

## 2017-06-02 ENCOUNTER — Observation Stay (HOSPITAL_COMMUNITY)
Admission: RE | Admit: 2017-06-02 | Discharge: 2017-06-03 | Disposition: A | Discharge status: Home / Self Care | Payer: Medicare Other | Source: Ambulatory Visit | Attending: Surgery | Admitting: Surgery

## 2017-06-02 ENCOUNTER — Encounter (HOSPITAL_COMMUNITY): Payer: Self-pay | Admitting: *Deleted

## 2017-06-02 DIAGNOSIS — G2581 Restless legs syndrome: Secondary | ICD-10-CM | POA: Insufficient documentation

## 2017-06-02 DIAGNOSIS — A438 Other forms of nocardiosis: Principal | ICD-10-CM | POA: Insufficient documentation

## 2017-06-02 DIAGNOSIS — K50119 Crohn's disease of large intestine with unspecified complications: Secondary | ICD-10-CM

## 2017-06-02 DIAGNOSIS — M19012 Primary osteoarthritis, left shoulder: Secondary | ICD-10-CM | POA: Diagnosis not present

## 2017-06-02 DIAGNOSIS — R7881 Bacteremia: Secondary | ICD-10-CM | POA: Diagnosis not present

## 2017-06-02 DIAGNOSIS — I712 Thoracic aortic aneurysm, without rupture: Secondary | ICD-10-CM | POA: Insufficient documentation

## 2017-06-02 DIAGNOSIS — Z85038 Personal history of other malignant neoplasm of large intestine: Secondary | ICD-10-CM | POA: Insufficient documentation

## 2017-06-02 DIAGNOSIS — R768 Other specified abnormal immunological findings in serum: Secondary | ICD-10-CM

## 2017-06-02 DIAGNOSIS — R198 Other specified symptoms and signs involving the digestive system and abdomen: Secondary | ICD-10-CM

## 2017-06-02 DIAGNOSIS — Z888 Allergy status to other drugs, medicaments and biological substances status: Secondary | ICD-10-CM | POA: Insufficient documentation

## 2017-06-02 DIAGNOSIS — E538 Deficiency of other specified B group vitamins: Secondary | ICD-10-CM | POA: Insufficient documentation

## 2017-06-02 DIAGNOSIS — Z95828 Presence of other vascular implants and grafts: Secondary | ICD-10-CM

## 2017-06-02 DIAGNOSIS — Z86711 Personal history of pulmonary embolism: Secondary | ICD-10-CM | POA: Insufficient documentation

## 2017-06-02 DIAGNOSIS — G629 Polyneuropathy, unspecified: Secondary | ICD-10-CM | POA: Insufficient documentation

## 2017-06-02 DIAGNOSIS — Z85828 Personal history of other malignant neoplasm of skin: Secondary | ICD-10-CM | POA: Diagnosis not present

## 2017-06-02 DIAGNOSIS — B1920 Unspecified viral hepatitis C without hepatic coma: Secondary | ICD-10-CM | POA: Diagnosis not present

## 2017-06-02 DIAGNOSIS — I4891 Unspecified atrial fibrillation: Secondary | ICD-10-CM | POA: Diagnosis not present

## 2017-06-02 DIAGNOSIS — N183 Chronic kidney disease, stage 3 (moderate): Secondary | ICD-10-CM | POA: Diagnosis not present

## 2017-06-02 DIAGNOSIS — N2581 Secondary hyperparathyroidism of renal origin: Secondary | ICD-10-CM | POA: Diagnosis not present

## 2017-06-02 DIAGNOSIS — C184 Malignant neoplasm of transverse colon: Secondary | ICD-10-CM | POA: Diagnosis not present

## 2017-06-02 DIAGNOSIS — M170 Bilateral primary osteoarthritis of knee: Secondary | ICD-10-CM | POA: Insufficient documentation

## 2017-06-02 DIAGNOSIS — Z79899 Other long term (current) drug therapy: Secondary | ICD-10-CM | POA: Insufficient documentation

## 2017-06-02 DIAGNOSIS — R911 Solitary pulmonary nodule: Secondary | ICD-10-CM | POA: Diagnosis not present

## 2017-06-02 DIAGNOSIS — I129 Hypertensive chronic kidney disease with stage 1 through stage 4 chronic kidney disease, or unspecified chronic kidney disease: Secondary | ICD-10-CM | POA: Insufficient documentation

## 2017-06-02 DIAGNOSIS — N281 Cyst of kidney, acquired: Secondary | ICD-10-CM | POA: Diagnosis not present

## 2017-06-02 DIAGNOSIS — M109 Gout, unspecified: Secondary | ICD-10-CM | POA: Insufficient documentation

## 2017-06-02 DIAGNOSIS — Z87891 Personal history of nicotine dependence: Secondary | ICD-10-CM | POA: Insufficient documentation

## 2017-06-02 DIAGNOSIS — Z932 Ileostomy status: Secondary | ICD-10-CM | POA: Diagnosis not present

## 2017-06-02 DIAGNOSIS — I700 Atherosclerosis of aorta: Secondary | ICD-10-CM | POA: Diagnosis not present

## 2017-06-02 DIAGNOSIS — K5010 Crohn's disease of large intestine without complications: Secondary | ICD-10-CM | POA: Insufficient documentation

## 2017-06-02 DIAGNOSIS — Z9049 Acquired absence of other specified parts of digestive tract: Secondary | ICD-10-CM | POA: Insufficient documentation

## 2017-06-02 DIAGNOSIS — Z7901 Long term (current) use of anticoagulants: Secondary | ICD-10-CM | POA: Insufficient documentation

## 2017-06-02 DIAGNOSIS — A439 Nocardiosis, unspecified: Secondary | ICD-10-CM

## 2017-06-02 DIAGNOSIS — Z87442 Personal history of urinary calculi: Secondary | ICD-10-CM | POA: Insufficient documentation

## 2017-06-02 DIAGNOSIS — F4310 Post-traumatic stress disorder, unspecified: Secondary | ICD-10-CM | POA: Insufficient documentation

## 2017-06-02 HISTORY — DX: Ileostomy status: Z93.2

## 2017-06-02 HISTORY — DX: Other specified symptoms and signs involving the digestive system and abdomen: R19.8

## 2017-06-02 LAB — COMPREHENSIVE METABOLIC PANEL
ALT: 17 U/L (ref 17–63)
AST: 25 U/L (ref 15–41)
Albumin: 3.3 g/dL — ABNORMAL LOW (ref 3.5–5.0)
Alkaline Phosphatase: 130 U/L — ABNORMAL HIGH (ref 38–126)
Anion gap: 6 (ref 5–15)
BUN: 11 mg/dL (ref 6–20)
CHLORIDE: 111 mmol/L (ref 101–111)
CO2: 24 mmol/L (ref 22–32)
CREATININE: 1.55 mg/dL — AB (ref 0.61–1.24)
Calcium: 7.5 mg/dL — ABNORMAL LOW (ref 8.9–10.3)
GFR calc Af Amer: 49 mL/min — ABNORMAL LOW (ref >60)
GFR calc non Af Amer: 42 mL/min — ABNORMAL LOW (ref >60)
Glucose, Bld: 116 mg/dL — ABNORMAL HIGH (ref 65–99)
Potassium: 4.6 mmol/L (ref 3.5–5.1)
SODIUM: 141 mmol/L (ref 135–145)
Total Bilirubin: 0.4 mg/dL (ref 0.3–1.2)
Total Protein: 5.8 g/dL — ABNORMAL LOW (ref 6.5–8.1)

## 2017-06-02 LAB — CBC
HEMATOCRIT: 34.6 % — AB (ref 39.0–52.0)
HEMOGLOBIN: 11.3 g/dL — AB (ref 13.0–17.0)
MCH: 28.4 pg (ref 26.0–34.0)
MCHC: 32.7 g/dL (ref 30.0–36.0)
MCV: 86.9 fL (ref 78.0–100.0)
Platelets: 66 10*3/uL — ABNORMAL LOW (ref 150–400)
RBC: 3.98 MIL/uL — ABNORMAL LOW (ref 4.22–5.81)
RDW: 15.7 % — ABNORMAL HIGH (ref 11.5–15.5)
WBC: 3.1 10*3/uL — ABNORMAL LOW (ref 4.0–10.5)

## 2017-06-02 LAB — PROTIME-INR
INR: 1.04
PROTHROMBIN TIME: 13.5 s (ref 11.4–15.2)

## 2017-06-02 LAB — HEMOGLOBIN A1C
Hgb A1c MFr Bld: 5 % (ref 4.8–5.6)
MEAN PLASMA GLUCOSE: 96.8 mg/dL

## 2017-06-02 LAB — SURGICAL PCR SCREEN
MRSA, PCR: NEGATIVE
Staphylococcus aureus: NEGATIVE

## 2017-06-02 LAB — APTT: aPTT: 33 seconds (ref 24–36)

## 2017-06-02 MED ORDER — GABAPENTIN 300 MG PO CAPS: 300.0000 mg | ORAL_CAPSULE | ORAL | Status: DC

## 2017-06-02 MED ORDER — SODIUM CHLORIDE 0.9 % IV SOLN: INTRAVENOUS | Status: DC

## 2017-06-02 MED ORDER — GABAPENTIN 100 MG PO CAPS
200.0000 mg | ORAL_CAPSULE | Freq: Every day | ORAL | Status: DC
  Administered 2017-06-02: 200 mg via ORAL
  Filled 2017-06-02: qty 2

## 2017-06-02 MED ORDER — SULFAMETHOXAZOLE-TRIMETHOPRIM 400-80 MG PO TABS
1.0000 | ORAL_TABLET | Freq: Two times a day (BID) | ORAL | Status: DC
  Administered 2017-06-02 – 2017-06-03 (×2): 1 via ORAL
  Filled 2017-06-02 (×3): qty 1

## 2017-06-02 MED ORDER — ALVIMOPAN 12 MG PO CAPS
12.0000 mg | ORAL_CAPSULE | Freq: Once | ORAL | Status: DC
  Filled 2017-06-02: qty 1

## 2017-06-02 MED ORDER — DIAZEPAM 5 MG PO TABS
5.0000 mg | ORAL_TABLET | Freq: Every evening | ORAL | Status: DC | PRN
  Administered 2017-06-02: 5 mg via ORAL
  Filled 2017-06-02: qty 1

## 2017-06-02 MED ORDER — CEFAZOLIN SODIUM-DEXTROSE 2-4 GM/100ML-% IV SOLN: 2.0000 g | Freq: Once | INTRAVENOUS | Status: DC

## 2017-06-02 MED ORDER — CHLORHEXIDINE GLUCONATE 4 % EX LIQD: 60.0000 mL | Freq: Once | CUTANEOUS | Status: DC

## 2017-06-02 MED ORDER — SODIUM CHLORIDE 0.9% FLUSH: 10.0000 mL | INTRAVENOUS | Status: DC | PRN

## 2017-06-02 MED ORDER — ACETAMINOPHEN 500 MG PO TABS
1000.0000 mg | ORAL_TABLET | ORAL | Status: AC
  Administered 2017-06-03: 1000 mg via ORAL
  Filled 2017-06-02: qty 2

## 2017-06-02 MED ORDER — CEFTRIAXONE SODIUM 2 G IJ SOLR
2.0000 g | INTRAMUSCULAR | Status: DC
  Administered 2017-06-02: 2 g via INTRAVENOUS
  Filled 2017-06-02: qty 2

## 2017-06-02 MED ORDER — DEXTROSE 5 % IV SOLN: 2.0000 g | INTRAVENOUS | Status: DC

## 2017-06-02 MED ORDER — KCL IN DEXTROSE-NACL 20-5-0.45 MEQ/L-%-% IV SOLN
INTRAVENOUS | Status: DC
  Administered 2017-06-02 (×2): via INTRAVENOUS
  Filled 2017-06-02 (×4): qty 1000
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
## 2017-06-02 NOTE — Progress Notes (Signed)
PHARMACY CONSULT NOTE FOR:  OUTPATIENT  PARENTERAL ANTIBIOTIC THERAPY (OPAT)  Indication: norcardia bacteremia Regimen: ceftriaxone End date: 07/28/17  IV antibiotic discharge orders are pended. To discharging provider:  please sign these orders via discharge navigator,  Select New Orders & click on the button choice - Manage This Unsigned Work.     Thank you for allowing pharmacy to be a part of this patient's care.  Lynelle Doctor 06/02/2017, 5:22 PM

## 2017-06-02 NOTE — H&P (Addendum)
Derek [name]  Location: [facility] Surgery Patient #: [id number] DOB: [birth year] Married / Language: English / Race: White Male  History of Present Illness   The patient is a 76 year old male who presents with a complaint of colonic polyp.  He goes by "Derek [name]"  The PCP is Dr. Eddie Candle  He sees Dr. Loetta Rough at the [facility]   Cardiology - Dr. Loletha Grayer. End.  Renal - Dr. Lenna Sciara. Deterdin  GI sees Dr. Darci Current   He is doing well.  Feels good.  His wife is in the room with him.  He is ready for the ileostomy reversal tomorrow.  I answered a lot of questions and went over the surgery and the post op course.   Derek [name] looks much better than when I last saw him on 03 March 2017. The increase in IV fluids has been the right antedote. His creatinine is down to 1.48 on the 12 April 2017. He's gained about 6 pounds of weight. His appetite is better. He overall feels much better.  He is now ready to have his ileostomy reversed. He is beyond 6 months from his original surgery, and think it's okay to proceed. I have had lengthy discussions with Derek [name] and his wife about the risk of surgery. The primary concern is a scar tissue involving the small bowel from his remote ileocecectomy. Other potential complications of bleeding, infection, another leak from his anastomosis, worsening renal function, and wound issues. I think it is clear he would be IV fluid dependent without reversing his ileostomy. The plan will be a cardiac clearance with Dr. Harrell Gave End and to get guidance about when to stop his Eliquis and does he need bridging. I will then set up the ileostomy reversal. I will admit him the night before surgery for IV hydration.  Past Medical History: 1. Transverse colon cancer Dr. Carlean Purl did a colonoscopy on 03/18/2016. He was found [phone]) to have a flat polyp in the right transverse colon that showed high grade dysplasia with focal area suspicious for  superficial invasion. He underwent a transvervse colectomy and Enterolysis of adhesions - 08/14/2016 - D. Sabrie Moritz For 1.2 cm adenocarcinoma of the right transverse colon, 0/7 nodes (T2, N0) He had an ischemic section of his right transverse colon which required reoperation, right colectomy, and ilestomy on 08/27/2016. He had a rough hospital course, but went home on 09/14/2016. 1A. End ileostomy He is on Lomotil BID, Metamucil BI, and Acidophillus pills to control the ileostomy output - though the ileostomy is still high. He has gained his strength back and is doing more.  He will continue to see me once a month. The output seems to average 1 to 2 liters, no matter what he does.  2. History of Crohn's Prior ileocecectomy around 1978. He required a second operation. He had a fistula that took months to heal. He did not have straight forward surgery. He developed pancreatitis when placed on Humira. He tried 6 MP - but this caused renal problem He has has some prior SBO  3. History of PE It souds like this occurred when he was hosptialized for a SBO - he was on coumadin for a year - he's had no trouble since then 4. History of Hep C - from blood transfusion during his bowel resection in the 1970's 5. Followed by Dr. Loletha Grayer. End for cardiology. He was followed by Dr. Aundra Dubin for cardiology 6. Nephrolithiasis 7. Dilated ascending aortic root followed by Dr. Servando Snare He  Dr. Jimmy Footman saw Dr. Servando Snare on 03/19/2016 Currently root is 5.1 cm - consider replacement at 5.5 cm 8. 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 Chronic renal insufficiency Seen by Dr. Lenna Sciara. Deterding q 3 months 10. He has seen Dr. Aundra Dubin for cardiology - will get cards clearance pre op 11. Gout in right great toe - last attack several years 12. Anticoagulated on Eliquis - 5 mg - BID For atrial fib  ???  Social History: Married. Wife is [name]. He is a retired Clinical biochemist. He has 2 children - 9 and 37 yo. They know Sharmaine Base   Allergies (Tanisha A. Owens Shark, [place]; 04/15/2017 4:42 PM) Humira *ANALGESICS - ANTI-INFLAMMATORY*  Allopurinol *CHEMICALS*  Allergies Reconciled   Medication History (Tanisha A. Owens Shark, [place]; 04/15/2017 4:42 PM) Amiodarone HCl (400MG Tablet, Oral) Active. TraZODone HCl (100MG Tablet, Oral) Active. Terazosin HCl (1MG Capsule, Oral) Active. Paxil (10MG Tablet, Oral) Active. Colcrys (0.6MG Tablet, Oral) Active. Tums (500MG Tablet Chewable, Oral) Active. Refresh (1% Solution, Ophthalmic) Active. Vitamin D (Cholecalciferol) (1000UNIT Capsule, Oral) Active. Cyanocobalamin (100MCG/ML Solution, Injection) Active. Uloric (40MG Tablet, Oral) Active. Gabapentin (100MG Capsule, Oral) Active. Multiple Vitamin (Oral) Active. Omeprazole (20MG Capsule DR, Oral) Active. Minocycline HCl (100MG Capsule, Oral) Active. Eliquis (5MG Tablet, Oral) Active. Ondansetron (4MG Tablet Disint, Oral) Active. SPS (15GM/60ML Suspension, Oral) Active. Medications Reconciled  Vitals (Tanisha A. Brown RMA; 04/15/2017 4:42 PM) 04/15/2017 4:42 PM Weight: 193 lb Height: 72in Body Surface Area: 2.1 m Body Mass Index: 26.18 kg/m  Temp.: 98.56F  Pulse: 91 (Regular)  BP: 102/68 (Sitting, Left Arm, Standard)   Physical Exam  General: WN older WM alert.  Skin: Inspection and palpation of the skin unremarkable.  Eyes: Conjunctivae white, pupils equal. Face, ears, nose, mouth, and throat: Face - normal. Normal ears and nose. Lips and teeth normal.  Neck: Supple. No mass. Trachea midline. No thyroid mass.  Lymph Nodes: No supraclavicular or cervical adenopathy.  Lungs: Normal respiratory effort. Clear to auscultation and symmetric breath sounds.  Port IV in right upper chest Cardiovascular: Regular rate and rythm today. Normal  auscultation of the heart. No murmur or rub.  Abdomen: Soft. No mass. Midline wound - healed. Ileostomy in RUQ - doing okay. Ostomy output - thick dark green fluid.  Musculoskeletal/extremities: Normal gait. Good strength and ROM in upper and lower extremities.   Assessment & Plan  1.  ILEOSTOMY IN PLACE (Z93.2)  This has been high output - he has required IVF replacement at home  He now comes for reversal of the ileostomy  Hospitalize the night before surgery hydration.  2.  Norcardia infection  Discussed with Dr. Retia Passe - Dr. Page Spiro to see patient.  Has been on Septra x 5 days Addendum: Discussed with Dr. Johnnye Sima - he is concerned about proceeding with surgery at this time because of the nocardia infection.  He suggests waiting at least 6-8 weeks, receiving IV antibiotics, and then scheduling surgery.  He is also concerned about occult locations of the norcardia infection - and suggests CT scan of head and abdomen.  I will arrange these for tomorrow.  We will reschedule his surgery for mid December or early January, 2019  3.  MALIGNANT NEOPLASM OF TRANSVERSE COLON (C18.4)  Story: Transvervse colectomy and Enterolysis of adhesions - 08/14/2016 - D. Aviyon Hocevar  For 1.2 cm adenocarcinoma of the right transverse colon, 0/7 nodes (T2, N0)   He had an ischemic section of his right transverse colon which required reoperation, right  colectomy, and ilestomy on 08/27/2016. Impression: Plan:  4.  Anticoagulated - on Eliquis - 5 mg - BID  1) To stop Eliquis 5 days before surgery  5.  ASCENDING AORTIC ANEURYSM (I71.2)  Impression: Followed by Dr. Servando Snare on 03/19/2016  Currently root is 5.1 cm - consider replacement at 5.5 cm 6.  ATRIAL FIBRILLATION, CONTROLLED (I48.91) 7.  Chronic renal insufficiency  Spoke to Dr. Jimmy Footman q 3 months  8.  He grew out a gram + rod from Mr. Vonbargen blood (drawn 04/26/2017) 9.  PICC removed.  Replaced with tunneled  central catheter on Friday, 10/5 by IR. 10.  Recurrent SBO's  11. History of Crohn's Prior ileocecectomy around 1978. He required a second operation. He had a fistula that took months to heal. He did not have straight forward surgery. He developed pancreatitis when placed on Humira. He tried 6 MP - but this caused renal problem He has has some prior SBO 12. History of PE  It souds like this occurred when he was hosptialized for a SBO - he was on coumadin for a year - he's had no trouble since then 13 History of Hep C - from blood transfusion during his bowel resection in the 1970's 14. Nephrolithiasis 15. Post tramatic stress syndrome - from service Takes Paxil nightly 16. Gout in right great toe - last attack several years   [name], MD, [facility] Pager: [phone] Office phone:  [phone]

## 2017-06-03 ENCOUNTER — Encounter (HOSPITAL_COMMUNITY)
Admission: RE | Disposition: A | Discharge status: Home / Self Care | Payer: Self-pay | Source: Ambulatory Visit | Attending: Surgery

## 2017-06-03 ENCOUNTER — Observation Stay (HOSPITAL_COMMUNITY): Payer: Medicare Other

## 2017-06-03 ENCOUNTER — Encounter (HOSPITAL_COMMUNITY): Payer: Self-pay | Admitting: Radiology

## 2017-06-03 DIAGNOSIS — R918 Other nonspecific abnormal finding of lung field: Secondary | ICD-10-CM | POA: Diagnosis not present

## 2017-06-03 DIAGNOSIS — A438 Other forms of nocardiosis: Secondary | ICD-10-CM | POA: Diagnosis not present

## 2017-06-03 DIAGNOSIS — I4891 Unspecified atrial fibrillation: Secondary | ICD-10-CM | POA: Diagnosis not present

## 2017-06-03 DIAGNOSIS — K50119 Crohn's disease of large intestine with unspecified complications: Secondary | ICD-10-CM | POA: Diagnosis not present

## 2017-06-03 DIAGNOSIS — I129 Hypertensive chronic kidney disease with stage 1 through stage 4 chronic kidney disease, or unspecified chronic kidney disease: Secondary | ICD-10-CM | POA: Diagnosis not present

## 2017-06-03 DIAGNOSIS — I712 Thoracic aortic aneurysm, without rupture: Secondary | ICD-10-CM | POA: Diagnosis not present

## 2017-06-03 DIAGNOSIS — R768 Other specified abnormal immunological findings in serum: Secondary | ICD-10-CM

## 2017-06-03 DIAGNOSIS — R198 Other specified symptoms and signs involving the digestive system and abdomen: Secondary | ICD-10-CM | POA: Diagnosis not present

## 2017-06-03 DIAGNOSIS — R51 Headache: Secondary | ICD-10-CM | POA: Diagnosis not present

## 2017-06-03 DIAGNOSIS — Z932 Ileostomy status: Secondary | ICD-10-CM | POA: Diagnosis not present

## 2017-06-03 DIAGNOSIS — C184 Malignant neoplasm of transverse colon: Secondary | ICD-10-CM | POA: Diagnosis not present

## 2017-06-03 DIAGNOSIS — A439 Nocardiosis, unspecified: Secondary | ICD-10-CM

## 2017-06-03 DIAGNOSIS — N183 Chronic kidney disease, stage 3 (moderate): Secondary | ICD-10-CM | POA: Diagnosis not present

## 2017-06-03 DIAGNOSIS — R7881 Bacteremia: Secondary | ICD-10-CM | POA: Diagnosis not present

## 2017-06-03 DIAGNOSIS — N281 Cyst of kidney, acquired: Secondary | ICD-10-CM | POA: Diagnosis not present

## 2017-06-03 LAB — HCV RNA QUANT RFLX ULTRA OR GENOTYP
HCV RNA QNT(LOG COPY/ML): UNDETERMINED {Log_IU}/mL
HEPATITIS C QUANTITATION: NOT DETECTED [IU]/mL

## 2017-06-03 SURGERY — CLOSURE, ILEOSTOMY
Anesthesia: General

## 2017-06-03 MED ORDER — DEXTROSE 5 % IV SOLN
2.0000 g | INTRAVENOUS | Status: DC
  Administered 2017-06-03: 2 g via INTRAVENOUS
  Filled 2017-06-03: qty 2

## 2017-06-03 MED ORDER — IOPAMIDOL (ISOVUE-300) INJECTION 61%
INTRAVENOUS | Status: AC
  Filled 2017-06-03: qty 30

## 2017-06-03 MED ORDER — CEFTRIAXONE IV (FOR PTA / DISCHARGE USE ONLY): 2.0000 g | INTRAVENOUS | 0 refills | Status: DC

## 2017-06-03 MED ORDER — DEXTROSE 5 % IV SOLN: 2.0000 g | INTRAVENOUS | 30 refills | Status: DC

## 2017-06-03 MED ORDER — SULFAMETHOXAZOLE-TRIMETHOPRIM 400-80 MG PO TABS: 1.0000 | ORAL_TABLET | Freq: Two times a day (BID) | ORAL | 2 refills | Status: DC

## 2017-06-03 MED ORDER — IOPAMIDOL (ISOVUE-300) INJECTION 61%: 15.0000 mL | Freq: Once | INTRAVENOUS | Status: DC | PRN

## 2017-06-03 NOTE — Care Management Obs Status (Signed)
[place] NOTIFICATION   Patient Details  Name: Derek [name] MRN: [id number] Date of Birth: Jun 21, 1941   Medicare Observation Status Notification Given:  Yes    Guadalupe Maple, RN 06/03/2017, 11:18 AM

## 2017-06-03 NOTE — Progress Notes (Signed)
[facility]  Active HRI patient with [facility].  [facility] Team will follow pt while inpatient to support home care needs at DC.  [name] Hospital Infusion Coordinator will follow closely with ID team to support current recommendation of IVABX x 8 weeks per Dr. Algis Downs consult note.  If patient discharges after hours, please call [phone].   Larry Sierras 06/03/2017, 8:50 AM

## 2017-06-03 NOTE — Progress Notes (Signed)
Subjective: No new complaints   Antibiotics:  Anti-infectives    Start     Dose/Rate Route Frequency Ordered Stop   06/03/17 1800  cefTRIAXone (ROCEPHIN) 2 g in dextrose 5 % 50 mL IVPB     2 g 100 mL/hr over 30 Minutes Intravenous Every 24 hours 06/03/17 0937     06/03/17 0600  cefoTEtan (CEFOTAN) 2 g in dextrose 5 % 50 mL IVPB  Status:  Discontinued     2 g 100 mL/hr over 30 Minutes Intravenous On call to O.R. 06/02/17 1420 06/02/17 1653   06/03/17 0000  cefTRIAXone (ROCEPHIN) IVPB     2 g Intravenous Every 24 hours 06/03/17 0943 07/29/17 2359   06/03/17 0000  cefTRIAXone 2 g in dextrose 5 % 50 mL     2 g 100 mL/hr over 30 Minutes Intravenous Every 24 hours 06/03/17 0943     06/03/17 0000  sulfamethoxazole-trimethoprim (BACTRIM,SEPTRA) 400-80 MG tablet     1 tablet Oral Every 12 hours 06/03/17 0943     06/02/17 2200  sulfamethoxazole-trimethoprim (BACTRIM,SEPTRA) 400-80 MG per tablet 1 tablet     1 tablet Oral Every 12 hours 06/02/17 1647     06/02/17 1800  cefTRIAXone (ROCEPHIN) 2 g in dextrose 5 % 50 mL IVPB  Status:  Discontinued     2 g 100 mL/hr over 30 Minutes Intravenous Every 24 hours 06/02/17 1647 06/03/17 0937   06/02/17 1430  ceFAZolin (ANCEF) IVPB 2g/100 mL premix  Status:  Discontinued    Comments:  Give in IR for surgical prophylaxis   2 g 200 mL/hr over 30 Minutes Intravenous  Once 06/02/17 1420 06/02/17 1427      Medications: Scheduled Meds: . alvimopan  12 mg Oral Once  . gabapentin  200 mg Oral QHS  . iopamidol      . sulfamethoxazole-trimethoprim  1 tablet Oral Q12H   Continuous Infusions: . cefTRIAXone (ROCEPHIN)  IV    . dextrose 5 % and 0.45 % NaCl with KCl 20 mEq/L 125 mL/hr at 06/02/17 2332   PRN Meds:.diazepam, iopamidol, sodium chloride flush    Objective: Weight change:   Intake/Output Summary (Last 24 hours) at 06/03/17 1225 Last data filed at 06/03/17 1057  Gross per 24 hour  Intake             2150 ml  Output                 0 ml  Net             2150 ml   Blood pressure (!) 112/57, pulse (!) 57, temperature 98.1 F (36.7 C), temperature source Oral, resp. rate 18, height 6' (1.829 m), weight 191 lb 12.8 oz (87 kg), SpO2 98 %. Temp:  [97.6 F (36.4 C)-98.2 F (36.8 C)] 98.1 F (36.7 C) (11/01 0615) Pulse Rate:  [57-72] 57 (11/01 0615) Resp:  [18] 18 (10/31 2105) BP: (94-118)/(57-80) 112/57 (11/01 0615) SpO2:  [98 %-100 %] 98 % (11/01 0615) Weight:  [191 lb 12.8 oz (87 kg)] 191 lb 12.8 oz (87 kg) (10/31 1335)  Physical Exam: General: Alert and awake, oriented x3, not in any acute distress. HEENT: anicteric sclera, pupils reactive to light and accommodation, EOMI CVS regular rate, normal r,  no murmur rubs or gallops Chest: clear to auscultation bilaterally, no wheezing, rales or rhonchi Abdomen: soft nontender, nondistended, normal bowel sounds, Extremities: no  clubbing or edema noted bilaterally Skin:  Ostomy full,  his central line is clean Neuro: nonfocal  CBC:  CBC Latest Ref Rng & Units 06/02/2017 05/11/2017 05/10/2017  WBC 4.0 - 10.5 K/uL 3.1(L) 3.8(L) 4.8  Hemoglobin 13.0 - 17.0 g/dL 11.3(L) 11.7(L) 11.7(L)  Hematocrit 39.0 - 52.0 % 34.6(L) 36.3(L) 37.0(L)  Platelets 150 - 400 K/uL 66(L) 99(L) 110(L)      BMET  Recent Labs  06/02/17 1535  NA 141  K 4.6  CL 111  CO2 24  GLUCOSE 116*  BUN 11  CREATININE 1.55*  CALCIUM 7.5*     Liver Panel   Recent Labs  06/02/17 1535  PROT 5.8*  ALBUMIN 3.3*  AST 25  ALT 17  ALKPHOS 130*  BILITOT 0.4       Sedimentation Rate No results for input(s): ESRSEDRATE in the last 72 hours. C-Reactive Protein No results for input(s): CRP in the last 72 hours.  Micro Results: Recent Results (from the past 720 hour(s))  Culture, blood (routine x 2)     Status: Abnormal   Collection Time: 05/09/17  9:55 AM  Result Value Ref Range Status   Specimen Description BLOOD PICC LINE  Final   Special Requests   Final    BOTTLES DRAWN  AEROBIC AND ANAEROBIC Blood Culture adequate volume   Culture  Setup Time   Final    GRAM POSITIVE COCCI IN CLUSTERS IN BOTH AEROBIC AND ANAEROBIC BOTTLES CRITICAL RESULT CALLED TO, READ BACK BY AND VERIFIED WITH: D WOFFORD,PHARMD AT 8270 05/10/17 BY L BENFIELD    Culture (A)  Final    STAPHYLOCOCCUS SPECIES (COAGULASE NEGATIVE) THE SIGNIFICANCE OF ISOLATING THIS ORGANISM FROM A SINGLE SET OF BLOOD CULTURES WHEN MULTIPLE SETS ARE DRAWN IS UNCERTAIN. PLEASE NOTIFY THE MICROBIOLOGY DEPARTMENT WITHIN ONE WEEK IF SPECIATION AND SENSITIVITIES ARE REQUIRED. Performed at [facility], [place] [address], [place], [place] [address]    Report Status 05/12/2017 FINAL  Final  Blood Culture ID Panel (Reflexed)     Status: Abnormal   Collection Time: 05/09/17  9:55 AM  Result Value Ref Range Status   Enterococcus species NOT DETECTED NOT DETECTED Final   Listeria monocytogenes NOT DETECTED NOT DETECTED Final   Staphylococcus species DETECTED (A) NOT DETECTED Final    Comment: Methicillin (oxacillin) resistant coagulase negative staphylococcus. Possible blood culture contaminant (unless isolated from more than one blood culture draw or clinical case suggests pathogenicity). No antibiotic treatment is indicated for blood  culture contaminants. CRITICAL RESULT CALLED TO, READ BACK BY AND VERIFIED WITH: D WOFFORD,PHARMD AT 4492 05/10/17 BY L BENFIELD    Staphylococcus aureus NOT DETECTED NOT DETECTED Final   Methicillin resistance DETECTED (A) NOT DETECTED Final    Comment: CRITICAL RESULT CALLED TO, READ BACK BY AND VERIFIED WITH: D WOFFORD,PHARMD AT 0100 05/10/17 BY L BENFIELD    Streptococcus species NOT DETECTED NOT DETECTED Final   Streptococcus agalactiae NOT DETECTED NOT DETECTED Final   Streptococcus pneumoniae NOT DETECTED NOT DETECTED Final   Streptococcus pyogenes NOT DETECTED NOT DETECTED Final   Acinetobacter baumannii NOT DETECTED NOT DETECTED Final   Enterobacteriaceae species NOT  DETECTED NOT DETECTED Final   Enterobacter cloacae complex NOT DETECTED NOT DETECTED Final   Escherichia coli NOT DETECTED NOT DETECTED Final   Klebsiella oxytoca NOT DETECTED NOT DETECTED Final   Klebsiella pneumoniae NOT DETECTED NOT DETECTED Final   Proteus species NOT DETECTED NOT DETECTED Final   Serratia marcescens NOT DETECTED NOT DETECTED Final   Haemophilus influenzae NOT DETECTED NOT DETECTED Final  Neisseria meningitidis NOT DETECTED NOT DETECTED Final   Pseudomonas aeruginosa NOT DETECTED NOT DETECTED Final   Candida albicans NOT DETECTED NOT DETECTED Final   Candida glabrata NOT DETECTED NOT DETECTED Final   Candida krusei NOT DETECTED NOT DETECTED Final   Candida parapsilosis NOT DETECTED NOT DETECTED Final   Candida tropicalis NOT DETECTED NOT DETECTED Final    Comment: Performed at [facility], [place] [address], [place], [place] [address]  Culture, blood (routine x 2)     Status: None   Collection Time: 05/09/17 10:00 AM  Result Value Ref Range Status   Specimen Description BLOOD LEFT ANTECUBITAL  Final   Special Requests   Final    BOTTLES DRAWN AEROBIC AND ANAEROBIC Blood Culture adequate volume   Culture   Final    NO GROWTH 5 DAYS Performed at [facility], [place] [address] [place], [place] [address]    Report Status 05/14/2017 FINAL  Final  Culture, blood (Routine X 2) w Reflex to ID Panel     Status: None   Collection Time: 05/10/17  6:29 PM  Result Value Ref Range Status   Specimen Description BLOOD LEFT ARM  Final   Special Requests   Final    BOTTLES DRAWN AEROBIC AND ANAEROBIC Blood Culture adequate volume   Culture   Final    NO GROWTH 5 DAYS Performed at [facility], [place] [address], [place], [place] [address]    Report Status 05/15/2017 FINAL  Final  Culture, blood (Routine X 2) w Reflex to ID Panel     Status: None   Collection Time: 05/10/17  6:36 PM  Result Value Ref Range Status   Specimen Description BLOOD LEFT ARM  Final    Special Requests   Final    BOTTLES DRAWN AEROBIC AND ANAEROBIC Blood Culture adequate volume   Culture   Final    NO GROWTH 5 DAYS Performed at [facility], [place] [address], [place], [place] [address]    Report Status 05/15/2017 FINAL  Final  Surgical pcr screen     Status: None   Collection Time: 06/02/17  3:49 PM  Result Value Ref Range Status   MRSA, PCR NEGATIVE NEGATIVE Final   Staphylococcus aureus NEGATIVE NEGATIVE Final    Comment: (NOTE) The Xpert SA Assay (FDA approved for NASAL specimens in patients 48 years of age and older), is one component of a comprehensive surveillance program. It is not intended to diagnose infection nor to guide or monitor treatment.   Culture, blood (Routine X 2) w Reflex to ID Panel     Status: None (Preliminary result)   Collection Time: 06/02/17  5:15 PM  Result Value Ref Range Status   Specimen Description   Final    BLOOD Blood Culture results may not be optimal due to an inadequate volume of blood received in culture bottles LEFT ANTECUBITAL   Special Requests IN PEDIATRIC BOTTLE Blood Culture adequate volume  Final   Culture PENDING  Incomplete   Report Status PENDING  Incomplete    Studies/Results: Ct Abdomen Pelvis Wo Contrast  Result Date: 06/03/2017 CLINICAL DATA:  Nocardia bacteremia. Colectomy for colon cancer. Ileostomy. EXAM: CT CHEST, ABDOMEN AND PELVIS WITHOUT CONTRAST TECHNIQUE: Multidetector CT imaging of the chest, abdomen and pelvis was performed following the standard protocol without IV contrast. COMPARISON:  Multiple prior CTs of the abdomen and pelvis including 05/09/2017 and 12/28/2008. FINDINGS: CT CHEST FINDINGS Cardiovascular: Heart size normal. Coronary  artery calcifications are again noted. No significant pericardial effusion is present. Atherosclerotic calcifications are present at the aortic arch without aneurysm. Right IJ tunnel catheter is in place. Pulmonary arteries are unremarkable. Mediastinum/Nodes: No  significant mediastinal or axillary adenopathy is present. Lungs/Pleura: Minimal dependent atelectasis is present bilaterally. There is scarring in the left lung. A 6 mm left pulmonary nodule is not significantly changed in size since the prior exams dating back to 2010. Musculoskeletal: 12 rib-bearing vertebral bodies are present. Heights are maintained. No focal lytic or blastic lesions are present. Degenerative changes are stable. CT ABDOMEN PELVIS FINDINGS Hepatobiliary: The liver is stable. No focal lesions are present. The common bile duct is within normal limits following cholecystectomy. Pancreas: Mild pancreatic atrophy is stable. No focal lesions are present. Spleen: Spleen is enlarged, measuring up to 17 mm. No discrete lesions are present. Adrenals/Urinary Tract: The adrenal glands are normal bilaterally. Nodularity of both kidneys is stable. No new lesions are present. The 2 cm cyst is present at the lower pole of the left kidney. Small calculi are stable. Stomach/Bowel: The stomach duodenum are within normal limits. The small bowel is unremarkable. Ileostomy is intact. Contrast is mostly in the distal bowel. Residual sigmoid in descending colonic pouch is unremarkable. Vascular/Lymphatic: No significant adenopathy is present. Atherosclerotic calcifications are present. Extensive vascular calcifications are noted within the splenic artery. There is no aneurysm. Reproductive: Prostate gland is within normal limits for age. Other: No discrete fluid collection is present. Fossa is unremarkable. No focal inflammatory or infectious lesions are evident. Musculoskeletal: Vertebral body heights and alignment are normal. The bony pelvis is intact. The hips are located and within normal limits. IMPRESSION: 1. Stable appearance of right hemicolectomy and ileostomy. 2. Distal colonic loop is unremarkable. 3. Extensive atherosclerotic change. 4. No acute or focal lesion to explain the patient's sepsis. No abscess.  5. Chronic splenomegaly. 6. Stable bilateral renal cysts and nonobstructing calculi. 7. Stable 6 mm pulmonary nodule in the left major fissure. No follow-up is necessary. Electronically Signed   By: San Morelle M.D.   On: 06/03/2017 12:17   Ct Head Wo Contrast  Result Date: 06/03/2017 CLINICAL DATA:  Pt. Hx hx of norcardia bactrremia and states he has no problems except weakness today, Surgery-partial colectomy with ileostomy, picc line in chest, large and small bowel surgery for chron's, rt. Inguinal hernia, knee, shoulder, Cancer-colon ca dx 04/2016 with surgery only EXAM: CT HEAD WITHOUT CONTRAST TECHNIQUE: Contiguous axial images were obtained from the base of the skull through the vertex without intravenous contrast. COMPARISON:  None. FINDINGS: Brain: No evidence of acute infarction, hemorrhage, hydrocephalus, extra-axial collection or mass lesion/mass effect. There is mild ventricular and sulcal enlargement reflecting age appropriate volume loss. Vascular: No hyperdense vessel or unexpected calcification. Skull: Normal. Negative for fracture or focal lesion. Sinuses/Orbits: Globes and orbits are unremarkable. Visualized sinuses and mastoid air cells are clear. Other: None. IMPRESSION: 1. No acute intracranial abnormalities. Normal CT of the brain for age. Electronically Signed   By: Lajean Manes M.D.   On: 06/03/2017 11:18   Ct Chest Wo Contrast  Result Date: 06/03/2017 CLINICAL DATA:  Nocardia bacteremia. Colectomy for colon cancer. Ileostomy. EXAM: CT CHEST, ABDOMEN AND PELVIS WITHOUT CONTRAST TECHNIQUE: Multidetector CT imaging of the chest, abdomen and pelvis was performed following the standard protocol without IV contrast. COMPARISON:  Multiple prior CTs of the abdomen and pelvis including 05/09/2017 and 12/28/2008. FINDINGS: CT CHEST FINDINGS Cardiovascular: Heart size normal. Coronary artery calcifications are again noted.  No significant pericardial effusion is present.  Atherosclerotic calcifications are present at the aortic arch without aneurysm. Right IJ tunnel catheter is in place. Pulmonary arteries are unremarkable. Mediastinum/Nodes: No significant mediastinal or axillary adenopathy is present. Lungs/Pleura: Minimal dependent atelectasis is present bilaterally. There is scarring in the left lung. A 6 mm left pulmonary nodule is not significantly changed in size since the prior exams dating back to 2010. Musculoskeletal: 12 rib-bearing vertebral bodies are present. Heights are maintained. No focal lytic or blastic lesions are present. Degenerative changes are stable. CT ABDOMEN PELVIS FINDINGS Hepatobiliary: The liver is stable. No focal lesions are present. The common bile duct is within normal limits following cholecystectomy. Pancreas: Mild pancreatic atrophy is stable. No focal lesions are present. Spleen: Spleen is enlarged, measuring up to 17 mm. No discrete lesions are present. Adrenals/Urinary Tract: The adrenal glands are normal bilaterally. Nodularity of both kidneys is stable. No new lesions are present. The 2 cm cyst is present at the lower pole of the left kidney. Small calculi are stable. Stomach/Bowel: The stomach duodenum are within normal limits. The small bowel is unremarkable. Ileostomy is intact. Contrast is mostly in the distal bowel. Residual sigmoid in descending colonic pouch is unremarkable. Vascular/Lymphatic: No significant adenopathy is present. Atherosclerotic calcifications are present. Extensive vascular calcifications are noted within the splenic artery. There is no aneurysm. Reproductive: Prostate gland is within normal limits for age. Other: No discrete fluid collection is present. Fossa is unremarkable. No focal inflammatory or infectious lesions are evident. Musculoskeletal: Vertebral body heights and alignment are normal. The bony pelvis is intact. The hips are located and within normal limits. IMPRESSION: 1. Stable appearance of right  hemicolectomy and ileostomy. 2. Distal colonic loop is unremarkable. 3. Extensive atherosclerotic change. 4. No acute or focal lesion to explain the patient's sepsis. No abscess. 5. Chronic splenomegaly. 6. Stable bilateral renal cysts and nonobstructing calculi. 7. Stable 6 mm pulmonary nodule in the left major fissure. No follow-up is necessary. Electronically Signed   By: San Morelle M.D.   On: 06/03/2017 12:17      Assessment/Plan:  INTERVAL HISTORY: Extensive discussions with pt, wife and with Dr. [name]   Active Problems:   High output ileostomy ([place])   Nocardia infection   Hepatitis C antibody test positive   Malignant neoplasm of transverse colon ([place])   Crohn's disease of colon with complication ([place])    JAVARIOUS ELSAYED is a 76 y.o. male with  Hx of colon cancer sp resection with high out put ostomy, Crohns disease--but NOT on potent immunosuppressive therapy who had indwelling PICC for IVF due to problems with dehydration, which was removed. Along the way a blood cutlure in September had also been done which eventually grew Nocardia. In the interim he had a new tunneled central line placed. Dr. Jimmy Footman had started him on oral bactrim and ultimately Dr. Johnnye Sima was alerted to fact of his bacteremia  He has now had Ceftriaxone added.  #1 Nocardia bacteremia:  IDEALLY I would have liked for him to have a catheter "holiday" prior to placement of long term line for his Nocardia blood stream infection --  That being said I appreciate that placement of line was not simple. Nocardia is not as notorious as Candida or Staph for infecting lines so I am ok for now to "try" to treat through the line though if he turns out to have SEVERE CNS infection with a brain abscess for example I would INSIST on getting the line  out and giving him a catheter holiday  The other encouraging data is that blood cultures prior to placement of his central line were no growth  He is getting  imaging done today of CNS< Chest and abdomen and head and at time of this note being written they are unremarkable  He should also have an MRI of his brain without contrast  I agree with plan on dual theapy with IV ceftriaxone 2 grams daily + oral bactrim x 8 weeks with transition to oral therapy likely with 2 drugs such as a macrolide + TMP/SMX  I spent greater than 40 minutes with the patient including greater than 50% of time in face to face counsel of the patient and in coordination of his  Care with Dr. [name]. Dr. [name] and I both counselled the patient separately and together as well as his wife on plan of action.  We will arrange HSFU in our clinic   Diagnosis: Nocardia blood stream infection  Culture Result: Nocardia   Allergies  Allergen Reactions  . Lorazepam Other (See Comments)    Reaction:  Hallucinations   . Humira [Adalimumab] Other (See Comments)    Pt states that he got pancreatitis.      OPAT Orders Discharge antibiotics: Iv ceftriaxone 2 grams daily  Duration: 8 weeks  End Date:  December 26th but he then would need to start oral therapy immediately after finishing IV  PIC Care Per Protocol:  Labs weekly while on IV antibiotics: x__ CBC with differential _x_ CMP  __ Please pull PIC at completion of IV antibiotics _x_ Please leave PIC in place until doctor has seen patient or been notified  Fax weekly labs to [phone]  Clinic Follow Up Appt:  Next 2-3  Weeks   @          LOS: 1 day   Alcide Evener 06/03/2017, 12:25 PM

## 2017-06-03 NOTE — Discharge Summary (Signed)
Physician Discharge Summary  Patient ID:  Derek [name]  MRN: [id number]  DOB/AGE: 1941/06/24 76 y.o.  Admit date: 06/02/2017 Discharge date: 06/03/2017  Discharge Diagnoses:  1.  ILEOSTOMY IN PLACE (Z93.2)             This has been high output - he has required IVF replacement at home             He came for reversal of the ileostomy - but this has been delayed to treat the Norcarida infection  2.  Norcardia infection             Seen by Dr. Johnnye [name] -  Plan - Bactrim DS BID and Ceftriaxone IV QD  3.  MALIGNANT NEOPLASM OF TRANSVERSE COLON (C18.4)             Story: Transvervse colectomy and Enterolysis of adhesions - 08/14/2016 - D. Derek [name]             For 1.2 cm adenocarcinoma of the right transverse colon, 0/7 nodes (T2, N0)              He had an ischemic section of his right transverse colon which required reoperation, right colectomy, and ilestomy on 08/27/2016. 4.  Anticoagulated - on Eliquis - 5 mg - BID 5.  ASCENDING AORTIC ANEURYSM (I71.2)             Impression: Followed by Derek [name] on 03/19/2016       Currently root is 5.1 cm - consider replacement at 5.5 cm 6.  ATRIAL FIBRILLATION, CONTROLLED (I48.91) 7.  Chronic renal insufficiency             Sees Dr. Jimmy [name] q 3 months  8.  He grew out a gram + rod from Derek [name] blood (drawn 04/26/2017) 9.  PICC removed.             Replaced with tunneled central catheter on Friday, 10/5 by IR. 10.  Recurrent SBO's  11. History of Crohn's Prior ileocecectomy around 1978. He required a second operation. He had a fistula that took months to heal. He did not have straight forward surgery. 12. History of PE        It souds like this occurred when he was hosptialized for a SBO - he was on coumadin for a year - he's had no trouble since then 13 History of Hep C - from blood transfusion during his bowel resection in the 1970's 14. Nephrolithiasis 15. Post tramatic stress syndrome - from  service Takes Paxil nightly 16. Gout in right great toe - last attack several years   Active Problems:   High output ileostomy (HCC)   Nocardia infection   Hepatitis C antibody test positive   Malignant neoplasm of transverse colon (HCC)   Crohn's disease of colon with complication ([place])   Operation: Procedure(s):  None  Discharged Condition: good  Hospital Course: Derek [name] is an 76 y.o. male whose primary care physician is Derek Sites, MD and who was admitted 06/02/2017 with a chief complaint of high output ileostomy.    He was originally scheduled for surgery to reverse the ileostomy.  But Dr. Johnnye [name], infectious disease consult, thought it would be wise to hold off surgery for 6 weeks to get antibiotics during that time to treat a Norcardia infection. We have ordered CT scan of head, chest, and abdomen.  But these are pending.   He is already on IVF, 1,500 cc per day at home.  He will go home on oral Bactrim DS BID and IV Ceftriaxone QD.  The discharge instructions were reviewed with the patient.  Consults: Infectious disease, Dr. Johnnye [name]  Significant Diagnostic Studies: Results for orders placed or performed during the hospital encounter of 06/02/17  Surgical pcr screen  Result Value Ref Range   MRSA, PCR NEGATIVE NEGATIVE   Staphylococcus aureus NEGATIVE NEGATIVE  Culture, blood (Routine X 2) w Reflex to ID Panel  Result Value Ref Range   Specimen Description      BLOOD Blood Culture results may not be optimal due to an inadequate volume of blood received in culture bottles LEFT ANTECUBITAL   Special Requests IN PEDIATRIC BOTTLE Blood Culture adequate volume    Culture PENDING    Report Status PENDING   Hemoglobin A1c  Result Value Ref Range   Hgb A1c MFr Bld 5.0 4.8 - 5.6 %   Mean Plasma Glucose 96.8 mg/dL  APTT upon arrival  Result Value Ref Range   aPTT 33 24 - 36 seconds  CBC upon arrival  Result Value Ref Range   WBC 3.1 (L) 4.0 - 10.5  K/uL   RBC 3.98 (L) 4.22 - 5.81 MIL/uL   Hemoglobin 11.3 (L) 13.0 - 17.0 g/dL   HCT 34.6 (L) 39.0 - 52.0 %   MCV 86.9 78.0 - 100.0 fL   MCH 28.4 26.0 - 34.0 pg   MCHC 32.7 30.0 - 36.0 g/dL   RDW 15.7 (H) 11.5 - 15.5 %   Platelets 66 (L) 150 - 400 K/uL  Protime-INR upon arrival  Result Value Ref Range   Prothrombin Time 13.5 11.4 - 15.2 seconds   INR 1.04   Comprehensive metabolic panel  Result Value Ref Range   Sodium 141 135 - 145 mmol/L   Potassium 4.6 3.5 - 5.1 mmol/L   Chloride 111 101 - 111 mmol/L   CO2 24 22 - 32 mmol/L   Glucose, Bld 116 (H) 65 - 99 mg/dL   BUN 11 6 - 20 mg/dL   Creatinine, Ser 1.55 (H) 0.61 - 1.24 mg/dL   Calcium 7.5 (L) 8.9 - 10.3 mg/dL   Total Protein 5.8 (L) 6.5 - 8.1 g/dL   Albumin 3.3 (L) 3.5 - 5.0 g/dL   AST 25 15 - 41 U/L   ALT 17 17 - 63 U/L   Alkaline Phosphatase 130 (H) 38 - 126 U/L   Total Bilirubin 0.4 0.3 - 1.2 mg/dL   GFR calc non Af Amer 42 (L) >60 mL/min   GFR calc Af Amer 49 (L) >60 mL/min   Anion gap 6 5 - 15    Ct Abdomen Pelvis Wo Contrast  Result Date: 05/09/2017 CLINICAL DATA:  Mid abdominal pain with history of bowel obstruction and ischemic bowel with osteotomy. Assess for recurrent obstruction. EXAM: CT ABDOMEN AND PELVIS WITHOUT CONTRAST TECHNIQUE: Multidetector CT imaging of the abdomen and pelvis was performed following the standard protocol without IV contrast. COMPARISON:  03/08/2017 FINDINGS: Lower chest: Mild scarring. No active process. No pleural or pericardial fluid. Hepatobiliary: Liver parenchyma is normal. Previous cholecystectomy. Ductal system within normal limits given previous cholecystectomy. Pancreas: Normal Spleen: Persistent moderate splenomegaly with length of 17 cm. No focal lesion. Adrenals/Urinary Tract: Adrenal glands are normal. Bilateral small nonobstructing stones, right more numerous than left. Small renal cysts. No hydronephrosis. Renal atrophy. Stomach/Bowel: Previous colectomy with ostomy to the  right of midline. Residual left colon is collapsed. This does contain some previously administered contrast. There dilated loops of  small intestine consistent with partial small bowel obstruction. Vascular/Lymphatic: Aortic atherosclerosis. No aneurysm. IVC is normal. No retroperitoneal adenopathy. Reproductive: Normal Other: No free fluid or air. Musculoskeletal: Ordinary lumbar degenerative changes. IMPRESSION: Previous bowel resection with right para median ostomy. Partial small bowel obstruction with dilated residual small intestine. No free fluid or air. Chronic splenomegaly. Chronic renal atrophy with nonobstructing small calculi and small cysts. Aortic atherosclerosis. Electronically Signed   By: Nelson Chimes M.D.   On: 05/09/2017 07:17   Dg Chest 1 View  Result Date: 05/07/2017 CLINICAL DATA:  PICC placement EXAM: CHEST 1 VIEW COMPARISON:  01/28/2017 chest radiograph. FINDINGS: Right internal jugular central venous catheter terminates in the upper third of the superior vena cava. Partially visualized left shoulder arthroplasty. Stable cardiomediastinal silhouette with top-normal heart size and aortic atherosclerosis. No pneumothorax. No pleural effusion. Low lung volumes. No pulmonary edema. Mild bibasilar scarring versus atelectasis. No acute consolidative airspace disease. IMPRESSION: 1. Right internal jugular central venous catheter terminates in the upper third of the superior vena cava. No pneumothorax. 2. Low lung volumes with mild bibasilar scarring versus atelectasis. Electronically Signed   By: Ilona Sorrel M.D.   On: 05/07/2017 18:38   Ir Fluoro Guide Cv Line Right  Result Date: 05/07/2017 INDICATION: Small bowel ileostomy, chronic dehydration, access for IV hydration EXAM: ULTRASOUND AND FLUOROSCOPIC GUIDED RIGHT IJ TUNNELED SINGLE-LUMEN POWER PICC LINE INSERTION MEDICATIONS: 1% lidocaine local CONTRAST:  None FLUOROSCOPY TIME:  18 seconds (2 mGy) COMPLICATIONS: None immediate. TECHNIQUE:  The procedure, risks, benefits, and alternatives were explained to the patient and informed written consent was obtained. A timeout was performed prior to the initiation of the procedure. The right neck and chest were prepped with chlorhexidine in a sterile fashion, and a sterile drape was applied covering the operative field. Maximum barrier sterile technique with sterile gowns and gloves were used for the procedure. A timeout was performed prior to the initiation of the procedure. Local anesthesia was provided with 1% lidocaine. Under direct ultrasound guidance, the right internal jugular vein was accessed with a micropuncture kit after the overlying soft tissues were anesthetized with 1% lidocaine. An ultrasound image was saved for documentation purposes. A guidewire was advanced to the level of the superior caval-atrial junction for measurement purposes and the PICC line was cut to length. In the right infraclavicular chest, the PICC line was tunneled subcutaneously to the venotomy site. A peel-away sheath was placed and a 24 cm, 5 French, single-lumen tunneled power PICC linewas inserted to level of the superior caval-atrial junction. A post procedure spot fluoroscopic was obtained. The catheter easily aspirated and flushed and was sutured in place. Catheter secured with Ethilon suture. Venotomy site closed with derma bond. A dressing was placed. The patient tolerated the procedure well without immediate post procedural complication. FINDINGS: After catheter placement, the tip lies within the superior cavoatrial junction. The catheter aspirates and flushes normally and is ready for immediate use. IMPRESSION: Successful ultrasound and fluoroscopic guided placement of a right internal jugular vein approach, 24 cm, 5 French, single-lumen tunneled powerPICC with tip at the superior caval-atrial junction. The PICC line is ready for immediate use. Electronically Signed   By: Jerilynn Mages.  Shick M.D.   On: 05/07/2017 12:46    Ir US Guide Vasc Access Right  Result Date: 05/07/2017 INDICATION: Small bowel ileostomy, chronic dehydration, access for IV hydration EXAM: ULTRASOUND AND FLUOROSCOPIC GUIDED RIGHT IJ TUNNELED SINGLE-LUMEN POWER PICC LINE INSERTION MEDICATIONS: 1% lidocaine local CONTRAST:  None FLUOROSCOPY TIME:  18 seconds (2 mGy) COMPLICATIONS: None immediate. TECHNIQUE: The procedure, risks, benefits, and alternatives were explained to the patient and informed written consent was obtained. A timeout was performed prior to the initiation of the procedure. The right neck and chest were prepped with chlorhexidine in a sterile fashion, and a sterile drape was applied covering the operative field. Maximum barrier sterile technique with sterile gowns and gloves were used for the procedure. A timeout was performed prior to the initiation of the procedure. Local anesthesia was provided with 1% lidocaine. Under direct ultrasound guidance, the right internal jugular vein was accessed with a micropuncture kit after the overlying soft tissues were anesthetized with 1% lidocaine. An ultrasound image was saved for documentation purposes. A guidewire was advanced to the level of the superior caval-atrial junction for measurement purposes and the PICC line was cut to length. In the right infraclavicular chest, the PICC line was tunneled subcutaneously to the venotomy site. A peel-away sheath was placed and a 24 cm, 5 French, single-lumen tunneled power PICC linewas inserted to level of the superior caval-atrial junction. A post procedure spot fluoroscopic was obtained. The catheter easily aspirated and flushed and was sutured in place. Catheter secured with Ethilon suture. Venotomy site closed with derma bond. A dressing was placed. The patient tolerated the procedure well without immediate post procedural complication. FINDINGS: After catheter placement, the tip lies within the superior cavoatrial junction. The catheter aspirates and  flushes normally and is ready for immediate use. IMPRESSION: Successful ultrasound and fluoroscopic guided placement of a right internal jugular vein approach, 24 cm, 5 French, single-lumen tunneled powerPICC with tip at the superior caval-atrial junction. The PICC line is ready for immediate use. Electronically Signed   By: Jerilynn Mages.  Shick M.D.   On: 05/07/2017 12:46    Discharge Exam:  Vitals:   06/02/17 2105 06/03/17 0615  BP: 94/64 (!) 112/57  Pulse: 69 (!) 57  Resp: 18   Temp: 98.2 F (36.8 C) 98.1 F (36.7 C)  SpO2: 100% 98%    General: WN WM who is alert and generally healthy appearing.  Lungs: Clear to auscultation and symmetric breath sounds. Heart:  RRR. No murmur or rub. Abdomen: Soft. No mass. No tenderness. No hernia. Normal bowel sounds.   Discharge Medications:   Allergies as of 06/03/2017      Reactions   Lorazepam Other (See Comments)   Reaction:  Hallucinations    Humira [adalimumab] Other (See Comments)   Pt states that he got pancreatitis.        Medication List    TAKE these medications   Acidophilus Lactobacillus Caps Take 1 capsule by mouth daily.   amiodarone 200 MG tablet Commonly known as:  PACERONE Take 1 tablet (200 mg total) by mouth daily.   calcium carbonate 500 MG chewable tablet Commonly known as:  TUMS - dosed in mg elemental calcium Chew 2 tablets by mouth 2 (two) times daily.   carboxymethylcellulose 0.5 % Soln Commonly known as:  REFRESH PLUS Place 1 drop into both eyes 4 (four) times daily.   cefTRIAXone 2 g in dextrose 5 % 50 mL Inject 2 g into the vein daily.   cefTRIAXone IVPB Commonly known as:  ROCEPHIN Inject 2 g into the vein daily. Indication:  norcardia bacteremia Last Day of Therapy:  07/28/17   clindamycin 1 % external solution Commonly known as:  CLEOCIN T Apply 1 application topically daily as needed (head breakouts).   cyanocobalamin 1000 MCG/ML injection Commonly known as:  (  VITAMIN B-12) Inject 1,000 mcg into  the muscle every 14 (fourteen) days.   cycloSPORINE 0.05 % ophthalmic emulsion Commonly known as:  RESTASIS Place 1 drop into both eyes 2 (two) times daily.   ELIQUIS 5 MG Tabs tablet Generic drug:  apixaban Take 1 tablet (5 mg total) by mouth 2 (two) times daily.   ferrous sulfate 325 (65 FE) MG tablet Take 325 mg by mouth daily with breakfast.   loperamide 2 MG capsule Commonly known as:  IMODIUM Take 4 mg by mouth 2 (two) times daily.   MAGNESIUM-OXIDE 400 (241.3 Mg) MG tablet Generic drug:  magnesium oxide Take 800 mg by mouth 2 (two) times daily.   METAMUCIL 0.52 g capsule Generic drug:  psyllium Take 0.52 g by mouth 2 (two) times daily.   minocycline 100 MG tablet Commonly known as:  DYNACIN Take 100 mg by mouth 2 (two) times daily as needed (face breakout).   multivitamin with minerals Tabs tablet Take 1 tablet by mouth daily.   omeprazole 20 MG capsule Commonly known as:  PRILOSEC Take 20 mg by mouth daily.   PARoxetine 40 MG tablet Commonly known as:  PAXIL Take 20 mg by mouth at bedtime.   PRESCRIPTION MEDICATION Inject 1,500 mLs into the vein See admin instructions. Dextrose 5% in normal saline (0.9%) administered at 13m/hr over 10 hours   sodium bicarbonate 650 MG tablet Take 1,300 mg by mouth 2 (two) times daily.   sulfamethoxazole-trimethoprim 400-80 MG tablet Commonly known as:  BACTRIM,SEPTRA Take 1 tablet by mouth every 12 (twelve) hours.   terazosin 2 MG capsule Commonly known as:  HYTRIN Take 2 mg by mouth at bedtime.   traZODone 100 MG tablet Commonly known as:  DESYREL Take 100 mg by mouth at bedtime.   Vitamin D 2000 units Caps Take 2,000 Units by mouth daily.            Home Infusion Instuctions        Start     Ordered   06/03/17 0000  Home infusion instructions [facility] May follow A[facility]Dosing Protocol; May administer Cathflo as needed to maintain patency of vascular access device.; Flushing of vascular  access device: [facility]: 0.9% NaCl pre/post medica...    Question Answer Comment  Instructions May follow [place]Dosing Protocol   Instructions May administer Cathflo as needed to maintain patency of vascular access device.   Instructions Flushing of vascular access device: per [facility]Protocol: 0.9% NaCl pre/post medication administration and prn patency; Heparin 100 u/ml, 522mfor implanted ports and Heparin 10u/ml, 29m629mor all other central venous catheters.   Instructions May follow [facility] Anaphylaxis Protocol for First Dose Administration in the home: 0.9% NaCl at 25-50 ml/hr to maintain IV access for protocol meds. Epinephrine 0.3 ml IV/IM PRN and Benadryl 25-50 IV/IM PRN s/s of anaphylaxis.   Instructions [facility] Infusion Coordinator (RN) to assist per patient IV care needs in the home PRN.      06/03/17 [id number]   Disposition: 06-Home-Health Care Svc  Discharge Instructions    Diet - low sodium heart healthy    Complete by:  As directed    Home infusion instructions [facility] May follow [facility]ing Protocol; May administer Cathflo as needed to maintain patency of vascular access device.; Flushing of vascular access device: per [facility]: 0.9% NaCl pre/post medica...    Complete by:  As directed    Instructions:  May follow [place] Dosing Protocol   Instructions:  May administer Cathflo as needed to maintain patency of vascular access device.   Instructions:  Flushing of vascular access device: per [facility] Protocol: 0.9% NaCl pre/post medication administration and prn patency; Heparin 100 u/ml, 13m for implanted ports and Heparin 10u/ml, 533mfor all other central venous catheters.   Instructions:  May follow [facility] Anaphylaxis Protocol for First Dose Administration in the home: 0.9% NaCl at 25-50 ml/hr to maintain IV access for protocol meds. Epinephrine 0.3 ml IV/IM PRN and Benadryl 25-50 IV/IM PRN s/s of anaphylaxis.   Instructions:  [facility] Coordinator (RN) to assist per patient IV care needs in the home PRN.   Increase activity slowly    Complete by:  As directed         Signed: DaAlphonsa OverallM.D., FAGraystone Eye Surgery Center LLCurgery Office:  [phone]/08/2016, 9:45 AM

## 2017-06-03 NOTE — Discharge Instructions (Signed)
[facility] - DISCHARGE INSTRUCTIONS TO PATIENT  Diet:  As tolerated  Follow up appointment:  Call Dr. Pollie Friar office [facility]) at [phone] for an appointment in 4 weeks  Medications and dosages:  Resume your home medications.  You have a prescription for:  Bactrim DS and Ceftriaxone (Rocephin)  Call Dr. [name] or his office  [phone]) if you have:  Temperature greater than 100.4,  Persistent nausea and vomiting,  Severe uncontrolled pain,  Redness, tenderness, or signs of infection (pain, swelling, redness, odor or green/yellow discharge around the site),  Difficulty breathing, headache or visual disturbances,  Any other questions or concerns you may have after discharge.  In an emergency, call 911 or go to an Emergency Department at a nearby hospital.

## 2017-06-03 NOTE — Care Management CC44 (Signed)
Condition Code 44 Documentation Completed  Patient Details  Name: AGAM DAVENPORT MRN: [id number] Date of Birth: July 14, 1941   Condition Code 44 given:  Yes Patient signature on Condition Code 44 notice:  Yes Documentation of 2 MD's agreement:  Yes Code 44 added to claim:  Yes    Guadalupe Maple, RN 06/03/2017, 11:18 AM

## 2017-06-03 NOTE — Progress Notes (Signed)
Discharge planning, spoke with patient and spouse at beside. Chose [facility] for [facility] services, contacted [facility] for referral. Spoke with Carolynn Sayers with [facility] and she will f/u with the patient. [phone]

## 2017-06-03 NOTE — Progress Notes (Signed)
      INFECTIOUS DISEASE ATTENDING ADDENDUM:   Date: 06/03/2017  Patient name: Derek [name]  Medical record number: [id number]  Date of birth: 11/17/1940    Full note to follow  I think this man needs an MRI of the brain without contrast in addition to all of the other imaging ordered (CT head without contrast will be fairly insensitive for Nocardia brain abscess infection)  I am also concerned that he has central line in place that was in at time Nocardia was isolated. I am wondering if it would be better to give hima  Formal central line "holiday" before placing new catheter and lettting him embark on treatment for his Nocardia with IV CTX and po bactrim  I will discuss with Dr. [name] and put in page to him.  Rhina Brackett Dam 06/03/2017, 10:05 AM

## 2017-06-04 DIAGNOSIS — R7881 Bacteremia: Secondary | ICD-10-CM | POA: Diagnosis not present

## 2017-06-04 DIAGNOSIS — D631 Anemia in chronic kidney disease: Secondary | ICD-10-CM | POA: Diagnosis not present

## 2017-06-04 DIAGNOSIS — I480 Paroxysmal atrial fibrillation: Secondary | ICD-10-CM | POA: Diagnosis not present

## 2017-06-04 DIAGNOSIS — K50912 Crohn's disease, unspecified, with intestinal obstruction: Secondary | ICD-10-CM | POA: Diagnosis not present

## 2017-06-04 DIAGNOSIS — B9562 Methicillin resistant Staphylococcus aureus infection as the cause of diseases classified elsewhere: Secondary | ICD-10-CM | POA: Diagnosis not present

## 2017-06-04 DIAGNOSIS — N183 Chronic kidney disease, stage 3 (moderate): Secondary | ICD-10-CM | POA: Diagnosis not present

## 2017-06-04 LAB — HEPATITIS C VRS RNA DETECT BY PCR-QUAL: Hepatitis C Vrs RNA by PCR-Qual: NEGATIVE

## 2017-06-07 DIAGNOSIS — K559 Vascular disorder of intestine, unspecified: Secondary | ICD-10-CM | POA: Diagnosis not present

## 2017-06-07 DIAGNOSIS — D631 Anemia in chronic kidney disease: Secondary | ICD-10-CM | POA: Diagnosis not present

## 2017-06-07 DIAGNOSIS — K50912 Crohn's disease, unspecified, with intestinal obstruction: Secondary | ICD-10-CM | POA: Diagnosis not present

## 2017-06-07 DIAGNOSIS — K912 Postsurgical malabsorption, not elsewhere classified: Secondary | ICD-10-CM | POA: Diagnosis not present

## 2017-06-07 DIAGNOSIS — I480 Paroxysmal atrial fibrillation: Secondary | ICD-10-CM | POA: Diagnosis not present

## 2017-06-07 DIAGNOSIS — K635 Polyp of colon: Secondary | ICD-10-CM | POA: Diagnosis not present

## 2017-06-07 DIAGNOSIS — N183 Chronic kidney disease, stage 3 (moderate): Secondary | ICD-10-CM | POA: Diagnosis not present

## 2017-06-07 DIAGNOSIS — E538 Deficiency of other specified B group vitamins: Secondary | ICD-10-CM | POA: Diagnosis not present

## 2017-06-07 DIAGNOSIS — C189 Malignant neoplasm of colon, unspecified: Secondary | ICD-10-CM | POA: Diagnosis not present

## 2017-06-07 DIAGNOSIS — B9562 Methicillin resistant Staphylococcus aureus infection as the cause of diseases classified elsewhere: Secondary | ICD-10-CM | POA: Diagnosis not present

## 2017-06-07 DIAGNOSIS — M109 Gout, unspecified: Secondary | ICD-10-CM | POA: Diagnosis not present

## 2017-06-07 DIAGNOSIS — N2581 Secondary hyperparathyroidism of renal origin: Secondary | ICD-10-CM | POA: Diagnosis not present

## 2017-06-07 DIAGNOSIS — N200 Calculus of kidney: Secondary | ICD-10-CM | POA: Diagnosis not present

## 2017-06-07 DIAGNOSIS — R7881 Bacteremia: Secondary | ICD-10-CM | POA: Diagnosis not present

## 2017-06-07 DIAGNOSIS — K5090 Crohn's disease, unspecified, without complications: Secondary | ICD-10-CM | POA: Diagnosis not present

## 2017-06-07 DIAGNOSIS — I129 Hypertensive chronic kidney disease with stage 1 through stage 4 chronic kidney disease, or unspecified chronic kidney disease: Secondary | ICD-10-CM | POA: Diagnosis not present

## 2017-06-07 LAB — CULTURE, BLOOD (ROUTINE X 2)
CULTURE: NO GROWTH
Special Requests: ADEQUATE

## 2017-06-10 ENCOUNTER — Ambulatory Visit: Payer: Medicare Other | Admitting: Cardiothoracic Surgery

## 2017-06-14 DIAGNOSIS — I480 Paroxysmal atrial fibrillation: Secondary | ICD-10-CM | POA: Diagnosis not present

## 2017-06-14 DIAGNOSIS — K50912 Crohn's disease, unspecified, with intestinal obstruction: Secondary | ICD-10-CM | POA: Diagnosis not present

## 2017-06-14 DIAGNOSIS — D631 Anemia in chronic kidney disease: Secondary | ICD-10-CM | POA: Diagnosis not present

## 2017-06-14 DIAGNOSIS — B9562 Methicillin resistant Staphylococcus aureus infection as the cause of diseases classified elsewhere: Secondary | ICD-10-CM | POA: Diagnosis not present

## 2017-06-14 DIAGNOSIS — N183 Chronic kidney disease, stage 3 (moderate): Secondary | ICD-10-CM | POA: Diagnosis not present

## 2017-06-14 DIAGNOSIS — R7881 Bacteremia: Secondary | ICD-10-CM | POA: Diagnosis not present

## 2017-06-21 DIAGNOSIS — N183 Chronic kidney disease, stage 3 (moderate): Secondary | ICD-10-CM | POA: Diagnosis not present

## 2017-06-21 DIAGNOSIS — R7881 Bacteremia: Secondary | ICD-10-CM | POA: Diagnosis not present

## 2017-06-21 DIAGNOSIS — B9562 Methicillin resistant Staphylococcus aureus infection as the cause of diseases classified elsewhere: Secondary | ICD-10-CM | POA: Diagnosis not present

## 2017-06-21 DIAGNOSIS — D631 Anemia in chronic kidney disease: Secondary | ICD-10-CM | POA: Diagnosis not present

## 2017-06-21 DIAGNOSIS — I480 Paroxysmal atrial fibrillation: Secondary | ICD-10-CM | POA: Diagnosis not present

## 2017-06-21 DIAGNOSIS — K50912 Crohn's disease, unspecified, with intestinal obstruction: Secondary | ICD-10-CM | POA: Diagnosis not present

## 2017-06-28 DIAGNOSIS — R7881 Bacteremia: Secondary | ICD-10-CM | POA: Diagnosis not present

## 2017-06-28 DIAGNOSIS — I480 Paroxysmal atrial fibrillation: Secondary | ICD-10-CM | POA: Diagnosis not present

## 2017-06-28 DIAGNOSIS — B9562 Methicillin resistant Staphylococcus aureus infection as the cause of diseases classified elsewhere: Secondary | ICD-10-CM | POA: Diagnosis not present

## 2017-06-28 DIAGNOSIS — K50912 Crohn's disease, unspecified, with intestinal obstruction: Secondary | ICD-10-CM | POA: Diagnosis not present

## 2017-06-28 DIAGNOSIS — D631 Anemia in chronic kidney disease: Secondary | ICD-10-CM | POA: Diagnosis not present

## 2017-06-28 DIAGNOSIS — N183 Chronic kidney disease, stage 3 (moderate): Secondary | ICD-10-CM | POA: Diagnosis not present

## 2017-06-29 ENCOUNTER — Encounter: Payer: Self-pay | Admitting: Infectious Diseases

## 2017-06-29 ENCOUNTER — Ambulatory Visit (INDEPENDENT_AMBULATORY_CARE_PROVIDER_SITE_OTHER): Payer: Medicare Other | Admitting: Infectious Diseases

## 2017-06-29 DIAGNOSIS — A439 Nocardiosis, unspecified: Secondary | ICD-10-CM

## 2017-06-29 DIAGNOSIS — R198 Other specified symptoms and signs involving the digestive system and abdomen: Secondary | ICD-10-CM | POA: Diagnosis not present

## 2017-06-29 DIAGNOSIS — Z932 Ileostomy status: Secondary | ICD-10-CM

## 2017-06-29 NOTE — Assessment & Plan Note (Signed)
He is doing well agree with his plan for reversal at the end of December.

## 2017-06-29 NOTE — Assessment & Plan Note (Signed)
He is doing well Warned him about sun burn with bactrim.  Stop ceftriaxone on 08-01-17.  Central line removal per his primary. Can remove and replace with PIC if he still needs IVF after he has his reversal.  Stop bactrim November 01, 2017.  Repeat BCx April 2019 Will see him back in 2 months, happy to see in hospital at time of his reversal.

## 2017-06-29 NOTE — Progress Notes (Signed)
   Subjective:    Patient ID: Derek [name], male    DOB: 1941-07-11, 76 y.o.   MRN: [id number]  HPI 76 y.o. male with a hx of Chron's disease, transverse colon cancer, colon resection and ileostomy Jan 2018.   He has had high output to his ostomy, he has been on 1.5 L of ivf daily. On no DMARDs/anti-TNF agents.  He had a PIC in his RUE for ~ 7 months.  This was replaced ~1 month.  Roughly 1 month ago he was seen in f/u in Dr Deterding's office on September 24. He had chills in the office and had BCx drawn at that time. This grew Nocardia nova (S- ceftriaxone, clarithromycin, linezolid, bactrim). He had repeat cx done on 10-4 that was negative.  He was in hospital earlier this month for SBO. He noted at that time that he had positive blood cultures the week prior (Coag neg Staph 1/2). He was d/c home on 10-11. His repeat BCx were negative.  He was called on 10-26 for BCx noting nocardia. He was started on bactrim He returned 06-02-17 for reversal of his ileostomy. His surgery was deferred and he was started on daily ceftriaxone, bactrim was continued po. He had CT chest/abd/pelvis which showed 6 mm pulmonary nodule but no abscess. He had MRI of head which was (-). His PIC (present since last Nov) was removed and he had central ine placed. He was d/c home on 06-03-17 and planned to receive 8 weeks of rx.   He is doing well. No complaints. No problems with his line. He continues to get IVF daily to help with his high output.  No problems with his ileostomy. His weakness is better.  His reversal is scheduled for 07-23-17.   Review of Systems  Constitutional: Negative for appetite change, chills, fatigue, fever and unexpected weight change.  Respiratory: Negative for shortness of breath.   Gastrointestinal: Negative for constipation and diarrhea.  Genitourinary: Negative for difficulty urinating.  sunburns easily.  Please see HPI. All other systems reviewed and negative.      Objective:   Physical Exam  Constitutional: He appears well-developed and well-nourished.  HENT:  Mouth/Throat: No oropharyngeal exudate.  Eyes: EOM are normal.  Neck: Neck supple.  Cardiovascular: Normal rate, regular rhythm and normal heart sounds.  Pulmonary/Chest: Effort normal and breath sounds normal.    Abdominal: Soft. Bowel sounds are normal. There is no tenderness. There is no rebound.    Musculoskeletal: Normal range of motion. He exhibits no edema.  Lymphadenopathy:    He has no cervical adenopathy.      Assessment & Plan:

## 2017-07-05 DIAGNOSIS — R7881 Bacteremia: Secondary | ICD-10-CM | POA: Diagnosis not present

## 2017-07-05 DIAGNOSIS — B9562 Methicillin resistant Staphylococcus aureus infection as the cause of diseases classified elsewhere: Secondary | ICD-10-CM | POA: Diagnosis not present

## 2017-07-05 DIAGNOSIS — K50912 Crohn's disease, unspecified, with intestinal obstruction: Secondary | ICD-10-CM | POA: Diagnosis not present

## 2017-07-05 DIAGNOSIS — D631 Anemia in chronic kidney disease: Secondary | ICD-10-CM | POA: Diagnosis not present

## 2017-07-05 DIAGNOSIS — I480 Paroxysmal atrial fibrillation: Secondary | ICD-10-CM | POA: Diagnosis not present

## 2017-07-05 DIAGNOSIS — N183 Chronic kidney disease, stage 3 (moderate): Secondary | ICD-10-CM | POA: Diagnosis not present

## 2017-07-06 ENCOUNTER — Other Ambulatory Visit: Payer: Self-pay | Admitting: Cardiothoracic Surgery

## 2017-07-06 DIAGNOSIS — I712 Thoracic aortic aneurysm, without rupture, unspecified: Secondary | ICD-10-CM

## 2017-07-08 ENCOUNTER — Ambulatory Visit: Payer: Medicare Other | Admitting: Cardiothoracic Surgery

## 2017-07-11 DIAGNOSIS — K50912 Crohn's disease, unspecified, with intestinal obstruction: Secondary | ICD-10-CM | POA: Diagnosis not present

## 2017-07-11 DIAGNOSIS — I480 Paroxysmal atrial fibrillation: Secondary | ICD-10-CM | POA: Diagnosis not present

## 2017-07-11 DIAGNOSIS — B9562 Methicillin resistant Staphylococcus aureus infection as the cause of diseases classified elsewhere: Secondary | ICD-10-CM | POA: Diagnosis not present

## 2017-07-11 DIAGNOSIS — N183 Chronic kidney disease, stage 3 (moderate): Secondary | ICD-10-CM | POA: Diagnosis not present

## 2017-07-11 DIAGNOSIS — D631 Anemia in chronic kidney disease: Secondary | ICD-10-CM | POA: Diagnosis not present

## 2017-07-11 DIAGNOSIS — R7881 Bacteremia: Secondary | ICD-10-CM | POA: Diagnosis not present

## 2017-07-12 DIAGNOSIS — R7881 Bacteremia: Secondary | ICD-10-CM | POA: Diagnosis not present

## 2017-07-12 DIAGNOSIS — D631 Anemia in chronic kidney disease: Secondary | ICD-10-CM | POA: Diagnosis not present

## 2017-07-12 DIAGNOSIS — Z5181 Encounter for therapeutic drug level monitoring: Secondary | ICD-10-CM | POA: Diagnosis not present

## 2017-07-12 DIAGNOSIS — N183 Chronic kidney disease, stage 3 (moderate): Secondary | ICD-10-CM | POA: Diagnosis not present

## 2017-07-13 DIAGNOSIS — M109 Gout, unspecified: Secondary | ICD-10-CM | POA: Diagnosis not present

## 2017-07-13 DIAGNOSIS — I480 Paroxysmal atrial fibrillation: Secondary | ICD-10-CM | POA: Diagnosis not present

## 2017-07-13 DIAGNOSIS — F4310 Post-traumatic stress disorder, unspecified: Secondary | ICD-10-CM | POA: Diagnosis not present

## 2017-07-13 DIAGNOSIS — Z932 Ileostomy status: Secondary | ICD-10-CM | POA: Diagnosis not present

## 2017-07-13 DIAGNOSIS — Z7901 Long term (current) use of anticoagulants: Secondary | ICD-10-CM | POA: Diagnosis not present

## 2017-07-13 DIAGNOSIS — Z452 Encounter for adjustment and management of vascular access device: Secondary | ICD-10-CM | POA: Diagnosis not present

## 2017-07-13 DIAGNOSIS — K50912 Crohn's disease, unspecified, with intestinal obstruction: Secondary | ICD-10-CM | POA: Diagnosis not present

## 2017-07-13 DIAGNOSIS — L719 Rosacea, unspecified: Secondary | ICD-10-CM | POA: Diagnosis not present

## 2017-07-13 DIAGNOSIS — N183 Chronic kidney disease, stage 3 (moderate): Secondary | ICD-10-CM | POA: Diagnosis not present

## 2017-07-13 DIAGNOSIS — Z5181 Encounter for therapeutic drug level monitoring: Secondary | ICD-10-CM | POA: Diagnosis not present

## 2017-07-13 DIAGNOSIS — D631 Anemia in chronic kidney disease: Secondary | ICD-10-CM | POA: Diagnosis not present

## 2017-07-13 DIAGNOSIS — R7881 Bacteremia: Secondary | ICD-10-CM | POA: Diagnosis not present

## 2017-07-13 DIAGNOSIS — G629 Polyneuropathy, unspecified: Secondary | ICD-10-CM | POA: Diagnosis not present

## 2017-07-13 DIAGNOSIS — F329 Major depressive disorder, single episode, unspecified: Secondary | ICD-10-CM | POA: Diagnosis not present

## 2017-07-13 DIAGNOSIS — Z87891 Personal history of nicotine dependence: Secondary | ICD-10-CM | POA: Diagnosis not present

## 2017-07-13 DIAGNOSIS — B9562 Methicillin resistant Staphylococcus aureus infection as the cause of diseases classified elsewhere: Secondary | ICD-10-CM | POA: Diagnosis not present

## 2017-07-13 DIAGNOSIS — K219 Gastro-esophageal reflux disease without esophagitis: Secondary | ICD-10-CM | POA: Diagnosis not present

## 2017-07-13 DIAGNOSIS — F419 Anxiety disorder, unspecified: Secondary | ICD-10-CM | POA: Diagnosis not present

## 2017-07-16 DIAGNOSIS — D631 Anemia in chronic kidney disease: Secondary | ICD-10-CM | POA: Diagnosis not present

## 2017-07-16 DIAGNOSIS — I129 Hypertensive chronic kidney disease with stage 1 through stage 4 chronic kidney disease, or unspecified chronic kidney disease: Secondary | ICD-10-CM | POA: Diagnosis not present

## 2017-07-16 DIAGNOSIS — I719 Aortic aneurysm of unspecified site, without rupture: Secondary | ICD-10-CM | POA: Diagnosis not present

## 2017-07-16 DIAGNOSIS — M109 Gout, unspecified: Secondary | ICD-10-CM | POA: Diagnosis not present

## 2017-07-16 DIAGNOSIS — C189 Malignant neoplasm of colon, unspecified: Secondary | ICD-10-CM | POA: Diagnosis not present

## 2017-07-16 DIAGNOSIS — N2581 Secondary hyperparathyroidism of renal origin: Secondary | ICD-10-CM | POA: Diagnosis not present

## 2017-07-16 DIAGNOSIS — N200 Calculus of kidney: Secondary | ICD-10-CM | POA: Diagnosis not present

## 2017-07-16 DIAGNOSIS — N183 Chronic kidney disease, stage 3 (moderate): Secondary | ICD-10-CM | POA: Diagnosis not present

## 2017-07-16 DIAGNOSIS — E538 Deficiency of other specified B group vitamins: Secondary | ICD-10-CM | POA: Diagnosis not present

## 2017-07-16 DIAGNOSIS — K912 Postsurgical malabsorption, not elsewhere classified: Secondary | ICD-10-CM | POA: Diagnosis not present

## 2017-07-16 DIAGNOSIS — M1990 Unspecified osteoarthritis, unspecified site: Secondary | ICD-10-CM | POA: Diagnosis not present

## 2017-07-16 DIAGNOSIS — K5090 Crohn's disease, unspecified, without complications: Secondary | ICD-10-CM | POA: Diagnosis not present

## 2017-07-19 DIAGNOSIS — B9562 Methicillin resistant Staphylococcus aureus infection as the cause of diseases classified elsewhere: Secondary | ICD-10-CM | POA: Diagnosis not present

## 2017-07-19 DIAGNOSIS — I480 Paroxysmal atrial fibrillation: Secondary | ICD-10-CM | POA: Diagnosis not present

## 2017-07-19 DIAGNOSIS — R7881 Bacteremia: Secondary | ICD-10-CM | POA: Diagnosis not present

## 2017-07-19 DIAGNOSIS — K50912 Crohn's disease, unspecified, with intestinal obstruction: Secondary | ICD-10-CM | POA: Diagnosis not present

## 2017-07-19 DIAGNOSIS — N183 Chronic kidney disease, stage 3 (moderate): Secondary | ICD-10-CM | POA: Diagnosis not present

## 2017-07-19 DIAGNOSIS — D631 Anemia in chronic kidney disease: Secondary | ICD-10-CM | POA: Diagnosis not present

## 2017-07-22 ENCOUNTER — Other Ambulatory Visit: Payer: Self-pay

## 2017-07-22 ENCOUNTER — Encounter (HOSPITAL_COMMUNITY): Payer: Self-pay

## 2017-07-22 ENCOUNTER — Inpatient Hospital Stay (HOSPITAL_COMMUNITY)
Admission: RE | Admit: 2017-07-22 | Discharge: 2017-07-31 | DRG: 330 | Disposition: A | Discharge status: Home / Self Care | Payer: Non-veteran care | Source: Ambulatory Visit | Attending: Surgery | Admitting: Surgery

## 2017-07-22 DIAGNOSIS — Z96612 Presence of left artificial shoulder joint: Secondary | ICD-10-CM | POA: Diagnosis present

## 2017-07-22 DIAGNOSIS — Z87891 Personal history of nicotine dependence: Secondary | ICD-10-CM

## 2017-07-22 DIAGNOSIS — N179 Acute kidney failure, unspecified: Secondary | ICD-10-CM | POA: Diagnosis not present

## 2017-07-22 DIAGNOSIS — I2510 Atherosclerotic heart disease of native coronary artery without angina pectoris: Secondary | ICD-10-CM | POA: Diagnosis present

## 2017-07-22 DIAGNOSIS — K219 Gastro-esophageal reflux disease without esophagitis: Secondary | ICD-10-CM | POA: Diagnosis present

## 2017-07-22 DIAGNOSIS — Z7901 Long term (current) use of anticoagulants: Secondary | ICD-10-CM | POA: Diagnosis not present

## 2017-07-22 DIAGNOSIS — I129 Hypertensive chronic kidney disease with stage 1 through stage 4 chronic kidney disease, or unspecified chronic kidney disease: Secondary | ICD-10-CM | POA: Diagnosis present

## 2017-07-22 DIAGNOSIS — F4310 Post-traumatic stress disorder, unspecified: Secondary | ICD-10-CM | POA: Diagnosis present

## 2017-07-22 DIAGNOSIS — Z432 Encounter for attention to ileostomy: Secondary | ICD-10-CM | POA: Diagnosis present

## 2017-07-22 DIAGNOSIS — K660 Peritoneal adhesions (postprocedural) (postinfection): Secondary | ICD-10-CM | POA: Diagnosis present

## 2017-07-22 DIAGNOSIS — Z85038 Personal history of other malignant neoplasm of large intestine: Secondary | ICD-10-CM | POA: Diagnosis not present

## 2017-07-22 DIAGNOSIS — Z79899 Other long term (current) drug therapy: Secondary | ICD-10-CM

## 2017-07-22 DIAGNOSIS — N183 Chronic kidney disease, stage 3 (moderate): Secondary | ICD-10-CM | POA: Diagnosis present

## 2017-07-22 DIAGNOSIS — Z86711 Personal history of pulmonary embolism: Secondary | ICD-10-CM | POA: Diagnosis not present

## 2017-07-22 DIAGNOSIS — I712 Thoracic aortic aneurysm, without rupture: Secondary | ICD-10-CM | POA: Diagnosis present

## 2017-07-22 DIAGNOSIS — Z932 Ileostomy status: Secondary | ICD-10-CM | POA: Diagnosis not present

## 2017-07-22 DIAGNOSIS — E869 Volume depletion, unspecified: Secondary | ICD-10-CM | POA: Diagnosis not present

## 2017-07-22 DIAGNOSIS — Z87442 Personal history of urinary calculi: Secondary | ICD-10-CM | POA: Diagnosis not present

## 2017-07-22 DIAGNOSIS — Z9889 Other specified postprocedural states: Secondary | ICD-10-CM | POA: Diagnosis present

## 2017-07-22 DIAGNOSIS — G629 Polyneuropathy, unspecified: Secondary | ICD-10-CM | POA: Diagnosis present

## 2017-07-22 DIAGNOSIS — R198 Other specified symptoms and signs involving the digestive system and abdomen: Secondary | ICD-10-CM

## 2017-07-22 DIAGNOSIS — G2581 Restless legs syndrome: Secondary | ICD-10-CM | POA: Diagnosis present

## 2017-07-22 DIAGNOSIS — I739 Peripheral vascular disease, unspecified: Secondary | ICD-10-CM | POA: Diagnosis present

## 2017-07-22 DIAGNOSIS — I4891 Unspecified atrial fibrillation: Secondary | ICD-10-CM | POA: Diagnosis present

## 2017-07-22 LAB — CBC WITH DIFFERENTIAL/PLATELET
Basophils Absolute: 0 10*3/uL (ref 0.0–0.1)
Basophils Relative: 0 %
EOS ABS: 0.1 10*3/uL (ref 0.0–0.7)
EOS PCT: 3 %
HCT: 37.5 % — ABNORMAL LOW (ref 39.0–52.0)
Hemoglobin: 12.8 g/dL — ABNORMAL LOW (ref 13.0–17.0)
Lymphocytes Relative: 17 %
Lymphs Abs: 0.8 10*3/uL (ref 0.7–4.0)
MCH: 30 pg (ref 26.0–34.0)
MCHC: 34.1 g/dL (ref 30.0–36.0)
MCV: 87.8 fL (ref 78.0–100.0)
MONO ABS: 0.4 10*3/uL (ref 0.1–1.0)
Monocytes Relative: 8 %
NEUTROS PCT: 72 %
Neutro Abs: 3.5 10*3/uL (ref 1.7–7.7)
PLATELETS: 105 10*3/uL — AB (ref 150–400)
RBC: 4.27 MIL/uL (ref 4.22–5.81)
RDW: 15 % (ref 11.5–15.5)
WBC: 4.8 10*3/uL (ref 4.0–10.5)

## 2017-07-22 LAB — COMPREHENSIVE METABOLIC PANEL
ALBUMIN: 3.7 g/dL (ref 3.5–5.0)
ALT: 24 U/L (ref 17–63)
AST: 31 U/L (ref 15–41)
Alkaline Phosphatase: 128 U/L — ABNORMAL HIGH (ref 38–126)
Anion gap: 8 (ref 5–15)
BUN: 14 mg/dL (ref 6–20)
CHLORIDE: 107 mmol/L (ref 101–111)
CO2: 25 mmol/L (ref 22–32)
Calcium: 8.2 mg/dL — ABNORMAL LOW (ref 8.9–10.3)
Creatinine, Ser: 1.64 mg/dL — ABNORMAL HIGH (ref 0.61–1.24)
GFR calc Af Amer: 46 mL/min — ABNORMAL LOW (ref >60)
GFR calc non Af Amer: 40 mL/min — ABNORMAL LOW (ref >60)
GLUCOSE: 104 mg/dL — AB (ref 65–99)
POTASSIUM: 4.3 mmol/L (ref 3.5–5.1)
Sodium: 140 mmol/L (ref 135–145)
Total Bilirubin: 0.6 mg/dL (ref 0.3–1.2)
Total Protein: 6.4 g/dL — ABNORMAL LOW (ref 6.5–8.1)

## 2017-07-22 LAB — HEMOGLOBIN A1C
HEMOGLOBIN A1C: 5 % (ref 4.8–5.6)
Mean Plasma Glucose: 96.8 mg/dL

## 2017-07-22 LAB — MRSA PCR SCREENING: MRSA BY PCR: NEGATIVE

## 2017-07-22 MED ORDER — PSYLLIUM 95 % PO PACK
1.0000 | PACK | Freq: Every day | ORAL | Status: DC
  Filled 2017-07-22: qty 1

## 2017-07-22 MED ORDER — PSYLLIUM 95 % PO PACK
2.0000 | PACK | Freq: Every day | ORAL | Status: DC
  Filled 2017-07-22 (×2): qty 2

## 2017-07-22 MED ORDER — LOPERAMIDE HCL 2 MG PO CAPS: 4.0000 mg | ORAL_CAPSULE | Freq: Two times a day (BID) | ORAL | Status: DC

## 2017-07-22 MED ORDER — GABAPENTIN 300 MG PO CAPS
300.0000 mg | ORAL_CAPSULE | ORAL | Status: AC
  Administered 2017-07-23: 300 mg via ORAL
  Filled 2017-07-22: qty 1

## 2017-07-22 MED ORDER — DEXTROSE 5 % IV SOLN
2.0000 g | INTRAVENOUS | Status: AC
  Administered 2017-07-24 – 2017-07-28 (×5): 2 g via INTRAVENOUS
  Filled 2017-07-22 (×6): qty 2

## 2017-07-22 MED ORDER — ACIDOPHILUS LACTOBACILLUS PO CAPS: 1.0000 | ORAL_CAPSULE | Freq: Every day | ORAL | Status: DC

## 2017-07-22 MED ORDER — KCL IN DEXTROSE-NACL 20-5-0.45 MEQ/L-%-% IV SOLN
INTRAVENOUS | Status: DC
  Administered 2017-07-22 – 2017-07-23 (×2): via INTRAVENOUS
  Filled 2017-07-22 (×3): qty 1000

## 2017-07-22 MED ORDER — CYCLOSPORINE 0.05 % OP EMUL
1.0000 [drp] | Freq: Two times a day (BID) | OPHTHALMIC | Status: DC
  Administered 2017-07-22 – 2017-07-31 (×17): 1 [drp] via OPHTHALMIC
  Filled 2017-07-22 (×18): qty 1

## 2017-07-22 MED ORDER — CHLORHEXIDINE GLUCONATE 4 % EX LIQD
60.0000 mL | Freq: Once | CUTANEOUS | Status: AC
  Administered 2017-07-23: 4 via TOPICAL
  Filled 2017-07-22 (×3): qty 60

## 2017-07-22 MED ORDER — CARBOXYMETHYLCELLULOSE SODIUM 0.5 % OP SOLN: 1.0000 [drp] | Freq: Four times a day (QID) | OPHTHALMIC | Status: DC

## 2017-07-22 MED ORDER — HEPARIN SODIUM (PORCINE) 5000 UNIT/ML IJ SOLN
5000.0000 [IU] | Freq: Once | INTRAMUSCULAR | Status: AC
  Administered 2017-07-23: 5000 [IU] via SUBCUTANEOUS
  Filled 2017-07-22: qty 1

## 2017-07-22 MED ORDER — PAROXETINE HCL 20 MG PO TABS
20.0000 mg | ORAL_TABLET | Freq: Every day | ORAL | Status: DC
  Administered 2017-07-22: 20 mg via ORAL
  Filled 2017-07-22: qty 1

## 2017-07-22 MED ORDER — CALCIUM CARBONATE ANTACID 500 MG PO CHEW: 2.0000 | CHEWABLE_TABLET | Freq: Two times a day (BID) | ORAL | Status: DC

## 2017-07-22 MED ORDER — TRAZODONE HCL 100 MG PO TABS: 100.0000 mg | ORAL_TABLET | Freq: Every day | ORAL | Status: DC

## 2017-07-22 MED ORDER — TERAZOSIN HCL 2 MG PO CAPS: 2.0000 mg | ORAL_CAPSULE | Freq: Every day | ORAL | Status: DC

## 2017-07-22 MED ORDER — TRAZODONE HCL 50 MG PO TABS
100.0000 mg | ORAL_TABLET | Freq: Every day | ORAL | Status: DC
  Administered 2017-07-22: 100 mg via ORAL
  Filled 2017-07-22: qty 1

## 2017-07-22 MED ORDER — GABAPENTIN 100 MG PO CAPS
200.0000 mg | ORAL_CAPSULE | Freq: Every day | ORAL | Status: DC
  Administered 2017-07-22: 200 mg via ORAL
  Filled 2017-07-22: qty 2

## 2017-07-22 MED ORDER — ACETAMINOPHEN 500 MG PO TABS
1000.0000 mg | ORAL_TABLET | ORAL | Status: AC
  Administered 2017-07-23: 1000 mg via ORAL
  Filled 2017-07-22: qty 2

## 2017-07-22 MED ORDER — PANTOPRAZOLE SODIUM 40 MG PO TBEC: 40.0000 mg | DELAYED_RELEASE_TABLET | Freq: Every day | ORAL | Status: DC

## 2017-07-22 MED ORDER — POLYVINYL ALCOHOL 1.4 % OP SOLN
1.0000 [drp] | OPHTHALMIC | Status: DC | PRN
  Filled 2017-07-22: qty 15

## 2017-07-22 MED ORDER — SULFAMETHOXAZOLE-TRIMETHOPRIM 400-80 MG PO TABS
1.0000 | ORAL_TABLET | Freq: Two times a day (BID) | ORAL | Status: DC
  Administered 2017-07-22: 1 via ORAL
  Filled 2017-07-22 (×2): qty 1

## 2017-07-22 MED ORDER — DEXTROSE 5 % IV SOLN
2.0000 g | INTRAVENOUS | Status: AC
  Administered 2017-07-23: 2 g via INTRAVENOUS
  Filled 2017-07-22: qty 2

## 2017-07-22 MED ORDER — GABAPENTIN 100 MG PO CAPS: 100.0000 mg | ORAL_CAPSULE | Freq: Every day | ORAL | Status: DC

## 2017-07-22 MED ORDER — SODIUM CHLORIDE 0.9% FLUSH: 10.0000 mL | INTRAVENOUS | Status: DC | PRN

## 2017-07-22 MED ORDER — MINOCYCLINE HCL 100 MG PO TABS: 100.0000 mg | ORAL_TABLET | Freq: Two times a day (BID) | ORAL | Status: DC | PRN

## 2017-07-22 MED ORDER — ALVIMOPAN 12 MG PO CAPS
12.0000 mg | ORAL_CAPSULE | ORAL | Status: AC
  Administered 2017-07-23: 12 mg via ORAL
  Filled 2017-07-22: qty 1

## 2017-07-22 MED ORDER — AMIODARONE HCL 200 MG PO TABS
200.0000 mg | ORAL_TABLET | Freq: Every day | ORAL | Status: DC
  Administered 2017-07-24 – 2017-07-31 (×8): 200 mg via ORAL
  Filled 2017-07-22 (×9): qty 1

## 2017-07-22 MED ORDER — RISAQUAD PO CAPS: 1.0000 | ORAL_CAPSULE | Freq: Every day | ORAL | Status: DC

## 2017-07-22 MED ORDER — GABAPENTIN 100 MG PO CAPS: 100.0000 mg | ORAL_CAPSULE | ORAL | Status: DC

## 2017-07-22 MED ORDER — TERAZOSIN HCL 2 MG PO CAPS
2.0000 mg | ORAL_CAPSULE | Freq: Every day | ORAL | Status: DC
  Administered 2017-07-22: 2 mg via ORAL
  Filled 2017-07-22: qty 1

## 2017-07-22 MED ORDER — AMIODARONE HCL 200 MG PO TABS: 200.0000 mg | ORAL_TABLET | Freq: Every day | ORAL | Status: DC

## 2017-07-22 MED ORDER — ADULT MULTIVITAMIN W/MINERALS CH: 1.0000 | ORAL_TABLET | Freq: Every day | ORAL | Status: DC

## 2017-07-22 MED ORDER — CHLORHEXIDINE GLUCONATE 4 % EX LIQD
60.0000 mL | Freq: Once | CUTANEOUS | Status: AC
  Filled 2017-07-22: qty 60

## 2017-07-22 NOTE — H&P (Signed)
Derek [name]  Location: [facility] Surgery Patient #: [id number] DOB: 1940/11/16 Married / Language: English / Race: White Male  History of Present Illness              The patient is a 76 year old male who presents with a complaint of colonic polyp.             He goes by "Derek [name]"             The PCP is Dr. Eddie Candle             He sees Dr. Loetta Rough at the [facility]                    Cardiology - Dr. Loletha Grayer. End.             Renal - Dr. Lenna Sciara. Deterding             GI sees Dr. Darci Current              He is doing well.  Feels good.  His wife has already gone home, but I talked to her on the phone.  He is ready for the ileostomy reversal tomorrow.  I answered a lot of questions and went over the surgery and the post op course.     His ileostomy reversal was delayed in November by the diagnosis of Norcardia and the treatment of this before any surgery.        He is now ready to have his ileostomy reversed. He is beyond 6 months from his original surgery, and think it's okay to proceed. I have had lengthy discussions with Derek [name] and his wife about the risk of surgery. The primary concern is a scar tissue involving the small bowel from his remote ileocecectomy. Other potential complications of bleeding, infection, another leak from his anastomosis, worsening renal function, and wound issues. I think it is clear he would be IV fluid dependent without reversing his ileostomy. The plan will be a cardiac clearance with Dr. Harrell Gave End and to get guidance about when to stop his Eliquis and does he need bridging. I will then set up the ileostomy reversal. I will admit him the night before surgery for IV hydration.  Past Medical History: 1. Transverse colon cancer Dr. Carlean Purl did a colonoscopy on 03/18/2016. He was found [id number]) to have a flat polyp in the right transverse colon that showed high grade dysplasia with focal area suspicious for superficial  invasion. He underwent a transvervse colectomy and Enterolysis of adhesions - 08/14/2016 - D. 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 He had a rough hospital course, but went home on 09/14/2016. 1A. End ileostomy He is on Lomotil BID, Metamucil BI, and Acidophillus pills to control the ileostomy output - though the ileostomy is still high. He has gained his strength back and is doing more.  He will continue to see me once a month. The output seems to average 1 to 2 liters, no matter what he does.  2. History of Crohn's Prior ileocecectomy around 1978. He required a second operation. He had a fistula that took months to heal. He did not have straight forward surgery. He developed pancreatitis when placed on Humira. He tried 6 MP - but this caused renal problem He has has some prior  SBO  3. History of PE It souds like this occurred when he was hosptialized for a SBO - he was on coumadin for a year - he's had no trouble since then 4. History of Hep C - from blood transfusion during his bowel resection in the 1970's 5. Followed by Dr. Loletha Grayer. End for cardiology. He was followed by Dr. Aundra Dubin for cardiology 6. Nephrolithiasis 7. Dilated ascending aortic root followed by Dr. Servando Snare He Dr. Jimmy Footman saw Dr. Servando Snare on 03/19/2016 Currently root is 5.1 cm - consider replacement at 5.5 cm 8. 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 Chronic renal insufficiency Seen by Dr. Lenna Sciara. Deterding q 3 months 10. He has seen Dr. Aundra Dubin for cardiology - will get cards clearance pre op 11. Gout in right great toe - last attack several years 12. Anticoagulated on Eliquis - 5 mg - BID For atrial fib  ???  Social History: Married. Wife is [name]. He is a retired Clinical biochemist. He has 2 children - 22 and 65 yo. They know Sharmaine Base   Allergies (Tanisha A. Owens Shark, [place]; 04/15/2017 4:42 PM) Humira *ANALGESICS - ANTI-INFLAMMATORY*  Allopurinol *CHEMICALS*  Allergies Reconciled   Medication History (Tanisha A. Owens Shark, [place]; 04/15/2017 4:42 PM) Amiodarone HCl (400MG Tablet, Oral) Active. TraZODone HCl (100MG Tablet, Oral) Active. Terazosin HCl (1MG Capsule, Oral) Active. Paxil (10MG Tablet, Oral) Active. Colcrys (0.6MG Tablet, Oral) Active. Tums (500MG Tablet Chewable, Oral) Active. Refresh (1% Solution, Ophthalmic) Active. Vitamin D (Cholecalciferol) (1000UNIT Capsule, Oral) Active. Cyanocobalamin (100MCG/ML Solution, Injection) Active. Uloric (40MG Tablet, Oral) Active. Gabapentin (100MG Capsule, Oral) Active. Multiple Vitamin (Oral) Active. Omeprazole (20MG Capsule DR, Oral) Active. Minocycline HCl (100MG Capsule, Oral) Active. Eliquis (5MG Tablet, Oral) Active. Ondansetron (4MG Tablet Disint, Oral) Active. SPS (15GM/60ML Suspension, Oral) Active. Medications Reconciled  Vitals (Tanisha A. Brown RMA; 04/15/2017 4:42 PM) 04/15/2017 4:42 PM Weight: 193 lb Height: 72in Body Surface Area: 2.1 m Body Mass Index: 26.18 kg/m  Temp.: 98.57F  Pulse: 91 (Regular)  BP: 102/68 (Sitting, Left Arm, Standard)   Physical Exam  General: WN older WM alert.  Skin: Inspection and palpation of the skin unremarkable.  Eyes: Conjunctivae white, pupils equal. Face, ears, nose, mouth, and throat: Face - normal. Normal ears and nose. Lips and teeth normal.  Neck: Supple. No mass. Trachea midline. No thyroid mass.  Lymph Nodes: No supraclavicular or cervical adenopathy.  Lungs: Normal respiratory effort. Clear to auscultation and symmetric breath sounds.             Port IV in right upper chest Cardiovascular: Regular rate and rythm  today. Normal auscultation of the heart. No murmur or rub.  Abdomen: Soft. No mass. Midline wound - healed. Ileostomy in RUQ - doing okay. Ostomy output - thick dark green fluid.  Musculoskeletal/extremities: Normal gait. Good strength and ROM in upper and lower extremities.   Assessment & Plan  1.  ILEOSTOMY IN PLACE (Z93.2)             This has been high output - he has required IVF replacement at home             He now comes for reversal of the ileostomy             Hospitalize the night before surgery hydration.  2.  Norcardia infection             He last saw Dr. Johnnye Sima on 06/29/2017   To continue the ceftriaxone until 08/01/2017.  Continue  the bactrim until 11/01/2017.  3.  MALIGNANT NEOPLASM OF TRANSVERSE COLON (C18.4)             Story: Transvervse colectomy and Enterolysis of adhesions - 08/14/2016 - D. 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 Impression: Plan:  4.  Anticoagulated - on Eliquis - 5 mg - BID             1) To stop Eliquis 5 days before surgery  5.  ASCENDING AORTIC ANEURYSM (I71.2)             Impression: Followed by Dr. Servando Snare on 03/19/2016       Currently root is 5.1 cm - consider replacement at 5.5 cm 6.  ATRIAL FIBRILLATION, CONTROLLED (I48.91) 7.  Chronic renal insufficiency             Spoke to Dr. Jimmy Footman q 3 months  8.  He grew out a gram + rod from Mr. Gehlhausen blood (drawn 04/26/2017) 9.  PICC removed.             Replaced with tunneled central catheter on Friday, 10/5 by IR. 10.  Recurrent SBO's  11. History of Crohn's Prior ileocecectomy around 1978. He required a second operation. He had a fistula that took months to heal. He did not have straight forward surgery. He developed pancreatitis  when placed on Humira. He tried 6 MP - but this caused renal problem He has has some prior SBO 12. History of PE        It souds like this occurred when he was hosptialized for a SBO - he was on coumadin for a year - he's had no trouble since then 13 History of Hep C - from blood transfusion during his bowel resection in the 1970's 14. Nephrolithiasis 15. Post tramatic stress syndrome - from service Takes Paxil nightly 16. Gout in right great toe - last attack several years   [name], MD, [facility] Surgery Pager: [phone] Office phone:  [phone]

## 2017-07-23 ENCOUNTER — Encounter (HOSPITAL_COMMUNITY): Payer: Self-pay | Admitting: Anesthesiology

## 2017-07-23 ENCOUNTER — Encounter (HOSPITAL_COMMUNITY)
Admission: RE | Disposition: A | Discharge status: Home / Self Care | Payer: Self-pay | Source: Ambulatory Visit | Attending: Surgery

## 2017-07-23 ENCOUNTER — Inpatient Hospital Stay (HOSPITAL_COMMUNITY): Payer: Non-veteran care | Admitting: Anesthesiology

## 2017-07-23 DIAGNOSIS — Z9889 Other specified postprocedural states: Secondary | ICD-10-CM | POA: Diagnosis present

## 2017-07-23 HISTORY — DX: Other specified postprocedural states: Z98.890

## 2017-07-23 HISTORY — PX: ILEOSTOMY CLOSURE: SHX1784

## 2017-07-23 LAB — CBC
HCT: 33.8 % — ABNORMAL LOW (ref 39.0–52.0)
Hemoglobin: 11.7 g/dL — ABNORMAL LOW (ref 13.0–17.0)
MCH: 30.2 pg (ref 26.0–34.0)
MCHC: 34.6 g/dL (ref 30.0–36.0)
MCV: 87.3 fL (ref 78.0–100.0)
PLATELETS: 77 10*3/uL — AB (ref 150–400)
RBC: 3.87 MIL/uL — AB (ref 4.22–5.81)
RDW: 15.2 % (ref 11.5–15.5)
WBC: 6.2 10*3/uL (ref 4.0–10.5)

## 2017-07-23 SURGERY — CLOSURE, ILEOSTOMY
Anesthesia: General | Site: Abdomen

## 2017-07-23 MED ORDER — HYDROMORPHONE HCL 1 MG/ML IJ SOLN
INTRAMUSCULAR | Status: AC
  Filled 2017-07-23: qty 2

## 2017-07-23 MED ORDER — 0.9 % SODIUM CHLORIDE (POUR BTL) OPTIME
TOPICAL | Status: DC | PRN
  Administered 2017-07-23: 3000 mL

## 2017-07-23 MED ORDER — HEPARIN SODIUM (PORCINE) 5000 UNIT/ML IJ SOLN
5000.0000 [IU] | Freq: Three times a day (TID) | INTRAMUSCULAR | Status: DC
  Administered 2017-07-23 – 2017-07-31 (×22): 5000 [IU] via SUBCUTANEOUS
  Filled 2017-07-23 (×23): qty 1

## 2017-07-23 MED ORDER — MEPERIDINE HCL 50 MG/ML IJ SOLN: 6.2500 mg | INTRAMUSCULAR | Status: DC | PRN

## 2017-07-23 MED ORDER — ROCURONIUM BROMIDE 50 MG/5ML IV SOSY
PREFILLED_SYRINGE | INTRAVENOUS | Status: AC
  Filled 2017-07-23: qty 5

## 2017-07-23 MED ORDER — SUGAMMADEX SODIUM 200 MG/2ML IV SOLN
INTRAVENOUS | Status: DC | PRN
  Administered 2017-07-23: 200 mg via INTRAVENOUS

## 2017-07-23 MED ORDER — HYDROMORPHONE HCL 1 MG/ML IJ SOLN
0.2500 mg | INTRAMUSCULAR | Status: DC | PRN
  Administered 2017-07-23 (×6): 0.5 mg via INTRAVENOUS

## 2017-07-23 MED ORDER — LACTATED RINGERS IV SOLN
INTRAVENOUS | Status: DC
  Administered 2017-07-23: 13:00:00 via INTRAVENOUS

## 2017-07-23 MED ORDER — LIDOCAINE 2% (20 MG/ML) 5 ML SYRINGE
INTRAMUSCULAR | Status: DC | PRN
  Administered 2017-07-23: 100 mg via INTRAVENOUS

## 2017-07-23 MED ORDER — BUPIVACAINE-EPINEPHRINE 0.25% -1:200000 IJ SOLN
INTRAMUSCULAR | Status: DC | PRN
  Administered 2017-07-23: 20 mL

## 2017-07-23 MED ORDER — FENTANYL CITRATE (PF) 100 MCG/2ML IJ SOLN
INTRAMUSCULAR | Status: AC
  Filled 2017-07-23: qty 2

## 2017-07-23 MED ORDER — ALVIMOPAN 12 MG PO CAPS
12.0000 mg | ORAL_CAPSULE | Freq: Two times a day (BID) | ORAL | Status: DC
  Administered 2017-07-24 – 2017-07-25 (×4): 12 mg via ORAL
  Filled 2017-07-23 (×5): qty 1

## 2017-07-23 MED ORDER — ROCURONIUM BROMIDE 10 MG/ML (PF) SYRINGE
PREFILLED_SYRINGE | INTRAVENOUS | Status: DC | PRN
  Administered 2017-07-23: 50 mg via INTRAVENOUS
  Administered 2017-07-23 (×3): 20 mg via INTRAVENOUS

## 2017-07-23 MED ORDER — BUPIVACAINE LIPOSOME 1.3 % IJ SUSP
20.0000 mL | Freq: Once | INTRAMUSCULAR | Status: AC
  Administered 2017-07-23: 20 mL
  Filled 2017-07-23: qty 20

## 2017-07-23 MED ORDER — MORPHINE SULFATE (PF) 4 MG/ML IV SOLN
1.0000 mg | INTRAVENOUS | Status: DC | PRN
  Administered 2017-07-23 – 2017-07-26 (×19): 4 mg via INTRAVENOUS
  Administered 2017-07-26: 2 mg via INTRAVENOUS
  Administered 2017-07-26: 4 mg via INTRAVENOUS
  Administered 2017-07-27 (×2): 2 mg via INTRAVENOUS
  Filled 2017-07-23 (×23): qty 1

## 2017-07-23 MED ORDER — FENTANYL CITRATE (PF) 100 MCG/2ML IJ SOLN
INTRAMUSCULAR | Status: AC
Start: 2017-07-23 — End: ?
  Filled 2017-07-23: qty 2

## 2017-07-23 MED ORDER — PHENYLEPHRINE 40 MCG/ML (10ML) SYRINGE FOR IV PUSH (FOR BLOOD PRESSURE SUPPORT)
PREFILLED_SYRINGE | INTRAVENOUS | Status: DC | PRN
  Administered 2017-07-23 (×2): 80 ug via INTRAVENOUS
  Administered 2017-07-23: 120 ug via INTRAVENOUS
  Administered 2017-07-23: 40 ug via INTRAVENOUS
  Administered 2017-07-23: 120 ug via INTRAVENOUS

## 2017-07-23 MED ORDER — LIDOCAINE 2% (20 MG/ML) 5 ML SYRINGE
INTRAMUSCULAR | Status: AC
  Filled 2017-07-23: qty 5

## 2017-07-23 MED ORDER — ALBUMIN HUMAN 5 % IV SOLN
INTRAVENOUS | Status: AC
  Filled 2017-07-23: qty 250

## 2017-07-23 MED ORDER — FENTANYL CITRATE (PF) 100 MCG/2ML IJ SOLN
INTRAMUSCULAR | Status: DC | PRN
  Administered 2017-07-23 (×2): 25 ug via INTRAVENOUS
  Administered 2017-07-23 (×5): 50 ug via INTRAVENOUS

## 2017-07-23 MED ORDER — DEXAMETHASONE SODIUM PHOSPHATE 10 MG/ML IJ SOLN
INTRAMUSCULAR | Status: DC | PRN
  Administered 2017-07-23: 10 mg via INTRAVENOUS

## 2017-07-23 MED ORDER — PHENYLEPHRINE HCL 10 MG/ML IJ SOLN
INTRAMUSCULAR | Status: DC | PRN
  Administered 2017-07-23: 50 ug/min via INTRAVENOUS

## 2017-07-23 MED ORDER — PROPOFOL 10 MG/ML IV BOLUS
INTRAVENOUS | Status: AC
  Filled 2017-07-23: qty 20

## 2017-07-23 MED ORDER — LIDOCAINE 2% (20 MG/ML) 5 ML SYRINGE
INTRAMUSCULAR | Status: DC | PRN
  Administered 2017-07-23: 1.5 mg/kg/h via INTRAVENOUS

## 2017-07-23 MED ORDER — ONDANSETRON HCL 4 MG/2ML IJ SOLN
4.0000 mg | Freq: Four times a day (QID) | INTRAMUSCULAR | Status: DC | PRN
  Administered 2017-07-24 – 2017-07-31 (×3): 4 mg via INTRAVENOUS
  Filled 2017-07-23 (×3): qty 2

## 2017-07-23 MED ORDER — ALBUMIN HUMAN 5 % IV SOLN
INTRAVENOUS | Status: DC | PRN
  Administered 2017-07-23: 10:00:00 via INTRAVENOUS

## 2017-07-23 MED ORDER — LACTATED RINGERS IV SOLN
INTRAVENOUS | Status: DC | PRN
  Administered 2017-07-23 (×2): via INTRAVENOUS

## 2017-07-23 MED ORDER — ONDANSETRON HCL 4 MG/2ML IJ SOLN
INTRAMUSCULAR | Status: DC | PRN
  Administered 2017-07-23: 4 mg via INTRAVENOUS

## 2017-07-23 MED ORDER — PROPOFOL 10 MG/ML IV BOLUS
INTRAVENOUS | Status: DC | PRN
  Administered 2017-07-23: 150 mg via INTRAVENOUS

## 2017-07-23 MED ORDER — SUCCINYLCHOLINE CHLORIDE 200 MG/10ML IV SOSY
PREFILLED_SYRINGE | INTRAVENOUS | Status: DC | PRN
  Administered 2017-07-23: 120 mg via INTRAVENOUS

## 2017-07-23 MED ORDER — DEXAMETHASONE SODIUM PHOSPHATE 10 MG/ML IJ SOLN
INTRAMUSCULAR | Status: AC
  Filled 2017-07-23: qty 1

## 2017-07-23 MED ORDER — KCL IN DEXTROSE-NACL 20-5-0.45 MEQ/L-%-% IV SOLN
INTRAVENOUS | Status: DC
  Administered 2017-07-23 – 2017-07-25 (×5): via INTRAVENOUS
  Filled 2017-07-23 (×5): qty 1000

## 2017-07-23 MED ORDER — FENTANYL CITRATE (PF) 100 MCG/2ML IJ SOLN: 25.0000 ug | INTRAMUSCULAR | Status: DC | PRN

## 2017-07-23 MED ORDER — PROMETHAZINE HCL 25 MG/ML IJ SOLN: 6.2500 mg | INTRAMUSCULAR | Status: DC | PRN

## 2017-07-23 MED ORDER — PHENYLEPHRINE HCL 10 MG/ML IJ SOLN
INTRAMUSCULAR | Status: AC
  Filled 2017-07-23: qty 1

## 2017-07-23 MED ORDER — HYDROCODONE-ACETAMINOPHEN 5-325 MG PO TABS: 1.0000 | ORAL_TABLET | ORAL | Status: DC | PRN

## 2017-07-23 MED ORDER — ONDANSETRON HCL 4 MG PO TABS: 4.0000 mg | ORAL_TABLET | Freq: Four times a day (QID) | ORAL | Status: DC | PRN

## 2017-07-23 MED ORDER — SUCCINYLCHOLINE CHLORIDE 200 MG/10ML IV SOSY
PREFILLED_SYRINGE | INTRAVENOUS | Status: AC
  Filled 2017-07-23: qty 10

## 2017-07-23 MED ORDER — ONDANSETRON HCL 4 MG/2ML IJ SOLN
INTRAMUSCULAR | Status: AC
  Filled 2017-07-23: qty 2

## 2017-07-23 MED ORDER — BUPIVACAINE-EPINEPHRINE 0.25% -1:200000 IJ SOLN
INTRAMUSCULAR | Status: AC
  Filled 2017-07-23: qty 1

## 2017-07-23 SURGICAL SUPPLY — 98 items
ADH SKN CLS APL DERMABOND .7 (GAUZE/BANDAGES/DRESSINGS)
APPLICATOR COTTON TIP 6IN STRL (MISCELLANEOUS) ×2 IMPLANT
APPLIER CLIP 5 13 M/L LIGAMAX5 (MISCELLANEOUS)
APPLIER CLIP ROT 10 11.4 M/L (STAPLE)
APR CLP MED LRG 11.4X10 (STAPLE)
APR CLP MED LRG 5 ANG JAW (MISCELLANEOUS)
BLADE EXTENDED COATED 6.5IN (ELECTRODE) ×2 IMPLANT
BLADE HEX COATED 2.75 (ELECTRODE) ×1 IMPLANT
CABLE HIGH FREQUENCY MONO STRZ (ELECTRODE) ×1 IMPLANT
CELLS DAT CNTRL 66122 CELL SVR (MISCELLANEOUS) IMPLANT
CLIP APPLIE 5 13 M/L LIGAMAX5 (MISCELLANEOUS) IMPLANT
CLIP APPLIE ROT 10 11.4 M/L (STAPLE) IMPLANT
CLIP VESOCCLUDE LG 6/CT (CLIP) IMPLANT
COUNTER NEEDLE 20 DBL MAG RED (NEEDLE) ×1 IMPLANT
COVER MAYO STAND STRL (DRAPES) ×3 IMPLANT
COVER SURGICAL LIGHT HANDLE (MISCELLANEOUS) ×2 IMPLANT
DECANTER SPIKE VIAL GLASS SM (MISCELLANEOUS) ×2 IMPLANT
DERMABOND ADVANCED (GAUZE/BANDAGES/DRESSINGS)
DERMABOND ADVANCED .7 DNX12 (GAUZE/BANDAGES/DRESSINGS) IMPLANT
DISSECTOR BLUNT TIP ENDO 5MM (MISCELLANEOUS) IMPLANT
DRAIN CHANNEL 19F RND (DRAIN) IMPLANT
DRAPE LAPAROSCOPIC ABDOMINAL (DRAPES) ×1 IMPLANT
DRAPE UTILITY XL STRL (DRAPES) ×2 IMPLANT
DRSG OPSITE POSTOP 4X10 (GAUZE/BANDAGES/DRESSINGS) IMPLANT
DRSG OPSITE POSTOP 4X6 (GAUZE/BANDAGES/DRESSINGS) IMPLANT
DRSG OPSITE POSTOP 4X8 (GAUZE/BANDAGES/DRESSINGS) IMPLANT
DRSG TELFA 3X8 NADH (GAUZE/BANDAGES/DRESSINGS) ×2 IMPLANT
ELECT PENCIL ROCKER SW 15FT (MISCELLANEOUS) ×3 IMPLANT
ELECT REM PT RETURN 15FT ADLT (MISCELLANEOUS) ×2 IMPLANT
EVACUATOR SILICONE 100CC (DRAIN) IMPLANT
EXTRT SYSTEM ALEXIS 17CM (MISCELLANEOUS)
GAUZE SPONGE 4X4 12PLY STRL (GAUZE/BANDAGES/DRESSINGS) ×2 IMPLANT
GLOVE BIOGEL PI IND STRL 7.0 (GLOVE) ×1 IMPLANT
GLOVE BIOGEL PI INDICATOR 7.0 (GLOVE) ×2
GLOVE EUDERMIC 7 POWDERFREE (GLOVE) ×2 IMPLANT
GLOVE SURG SIGNA 7.5 PF LTX (GLOVE) ×6 IMPLANT
GOWN SPEC L4 XLG W/TWL (GOWN DISPOSABLE) ×1 IMPLANT
GOWN STRL REUS W/ TWL XL LVL3 (GOWN DISPOSABLE) ×3 IMPLANT
GOWN STRL REUS W/TWL LRG LVL3 (GOWN DISPOSABLE) ×1 IMPLANT
GOWN STRL REUS W/TWL XL LVL3 (GOWN DISPOSABLE) ×10 IMPLANT
HOLDER FOLEY CATH W/STRAP (MISCELLANEOUS) ×2 IMPLANT
LEGGING LITHOTOMY PAIR STRL (DRAPES) ×1 IMPLANT
LIGASURE IMPACT 36 18CM CVD LR (INSTRUMENTS) ×1 IMPLANT
LUBRICANT JELLY K Y 4OZ (MISCELLANEOUS) IMPLANT
NDL HYPO 25X1 1.5 SAFETY (NEEDLE) ×1 IMPLANT
NEEDLE HYPO 25X1 1.5 SAFETY (NEEDLE) IMPLANT
NS IRRIG 1000ML POUR BTL (IV SOLUTION) ×4 IMPLANT
PACK COLON (CUSTOM PROCEDURE TRAY) ×2 IMPLANT
PACK GENERAL/GYN (CUSTOM PROCEDURE TRAY) ×1 IMPLANT
PAD DRESSING TELFA 3X8 NADH (GAUZE/BANDAGES/DRESSINGS) IMPLANT
PAD POSITIONING PINK XL (MISCELLANEOUS) ×1 IMPLANT
POSITIONER SURGICAL ARM (MISCELLANEOUS) IMPLANT
RELOAD STAPLE 60 3.8 GOLD REG (STAPLE) IMPLANT
RELOAD STAPLER GOLD 60MM (STAPLE) ×1 IMPLANT
RETRACTOR WND ALEXIS 18 MED (MISCELLANEOUS) IMPLANT
RTRCTR WOUND ALEXIS 18CM MED (MISCELLANEOUS)
SEALER TISSUE X1 CVD JAW (INSTRUMENTS) ×1 IMPLANT
SET IRRIG TUBING LAPAROSCOPIC (IRRIGATION / IRRIGATOR) ×1 IMPLANT
SHEARS HARMONIC ACE PLUS 36CM (ENDOMECHANICALS) IMPLANT
SLEEVE ADV FIXATION 5X100MM (TROCAR) ×1 IMPLANT
SPONGE LAP 18X18 X RAY DECT (DISPOSABLE) ×6 IMPLANT
STAPLER ECHELON LONG 60 440 (INSTRUMENTS) ×1 IMPLANT
STAPLER RELOAD GOLD 60MM (STAPLE) ×2
STAPLER VISISTAT 35W (STAPLE) ×2 IMPLANT
SUCTION POOLE TIP (SUCTIONS) ×1 IMPLANT
SUT ETHILON 3 0 PS 1 (SUTURE) IMPLANT
SUT MNCRL AB 4-0 PS2 18 (SUTURE) ×1 IMPLANT
SUT NOV 1 T60/GS (SUTURE) IMPLANT
SUT NOVA 1 T20/GS 25DT (SUTURE) ×7 IMPLANT
SUT NOVA NAB DX-16 0-1 5-0 T12 (SUTURE) IMPLANT
SUT NOVA T20/GS 25 (SUTURE) IMPLANT
SUT PDS AB 1 CTX 36 (SUTURE) IMPLANT
SUT PDS AB 1 TP1 96 (SUTURE) ×2 IMPLANT
SUT PROLENE 2 0 KS (SUTURE) IMPLANT
SUT PROLENE 2 0 SH DA (SUTURE) IMPLANT
SUT SILK 2 0 (SUTURE) ×2
SUT SILK 2 0 SH CR/8 (SUTURE) ×6 IMPLANT
SUT SILK 2 0SH CR/8 30 (SUTURE) IMPLANT
SUT SILK 2-0 18XBRD TIE 12 (SUTURE) ×1 IMPLANT
SUT SILK 2-0 30XBRD TIE 12 (SUTURE) IMPLANT
SUT SILK 3 0 (SUTURE) ×2
SUT SILK 3 0 SH CR/8 (SUTURE) ×2 IMPLANT
SUT SILK 3-0 18XBRD TIE 12 (SUTURE) ×1 IMPLANT
SUT VIC AB 3-0 SH 18 (SUTURE) IMPLANT
SYS LAPSCP GELPORT 120MM (MISCELLANEOUS)
SYSTEM CONTND EXTRCTN KII BLLN (MISCELLANEOUS) IMPLANT
SYSTEM LAPSCP GELPORT 120MM (MISCELLANEOUS) IMPLANT
TAPE CLOTH SURG 6X10 WHT LF (GAUZE/BANDAGES/DRESSINGS) ×1 IMPLANT
TOWEL OR NON WOVEN STRL DISP B (DISPOSABLE) ×1 IMPLANT
TRAY FOLEY W/METER SILVER 16FR (SET/KITS/TRAYS/PACK) ×2 IMPLANT
TROCAR ADV FIXATION 11X100MM (TROCAR) IMPLANT
TROCAR ADV FIXATION 12X100MM (TROCAR) IMPLANT
TROCAR ADV FIXATION 5X100MM (TROCAR) ×1 IMPLANT
TROCAR BLADELESS OPT 5 100 (ENDOMECHANICALS) ×1 IMPLANT
TROCAR XCEL BLUNT TIP 100MML (ENDOMECHANICALS) IMPLANT
TUBING CONNECTING 10 (TUBING) ×2 IMPLANT
TUBING INSUF HEATED (TUBING) ×1 IMPLANT
YANKAUER SUCT BULB TIP NO VENT (SUCTIONS) ×1 IMPLANT

## 2017-07-23 NOTE — Op Note (Signed)
07/22/2017 - 07/23/2017  11:35 AM  PATIENT:  Derek [name], 76 y.o., male, MRN: [id number]  PREOP DIAGNOSIS:  HIGH OUTPUT ILEOSTOMY  POSTOP DIAGNOSIS:   High output ileostomy, extensive intra-abdominal adhesions  PROCEDURE:   Procedure(s):  Open ILEOSTOMY REVERSAL, mobilization of the splenic flexure, 2 hours for enterolysis of adhesions  SURGEON:   [name], M.D.  ASSISTANT:   C. Dema Severin, M.D.  ANESTHESIA:   general  Anesthesiologist: Effie Berkshire, MD CRNA: Lind Covert, CRNA; Lollie Sails, CRNA  General  EBL:  350  ml  BLOOD ADMINISTERED: none  DRAINS: none   LOCAL MEDICATIONS USED:   20 cc of Exparel mixed with 20 cc 1/4% marcaine  SPECIMEN:   End of ileostomy  COUNTS CORRECT:  YES  INDICATIONS FOR PROCEDURE:  Derek [name] is a 76 y.o. (DOB: [birth year]) white male whose primary care physician is Sharilyn Sites, MD and comes for reversal of ilestomy.   He underwent a resection of a transverse colon cancer on 08/14/2016.  He represented on 08/27/2016 with an infarcted right transverse colon.  He had a right colectomy and end ileostomy.  He has had a high output ileostomy and has required IVF's at home to maintain hydration.  He now comes for reversal of the ileostomy.   The indications and risks of the surgery were explained to the patient.  The risks include, but are not limited to, infection, bleeding, and nerve injury.  PROCEDURE:   He was taken to room #4 at [place] and underwent a general anesthesia. He had a foley placed, an NGT placed, and his ileostomy was sewn closed.  His abdomen was prepped with Chloroprep and sterilely draped.   A time out was held and the surgical checklist run.   I made an upper midline incision into the abdominal cavity.  The patient had extensive adhesions of small bowel was stuck to the undersurface of the midline incision.  After getting the small bowel freed up from the midline, I dissected the small bowel  bowel away from the right and left abdomen.  I was able to clear the left abdomen,   I found the transected transverse colon that had 2 Prolene sutures in it.  On the right side of the abdomen, there were extensive small bowel adhesions.  The adhesions were small bowel to each other and small bowel to the anterior abdominal wall.  The dissection of adhesions were tedious.   Because of adhesions, I was not able to see the right lobe of the liver, the gall bladder, or most of the left lobe of the liver.  I was able to expose part of the stomach and too palpate the NGT in proper position.   It took down the ileostomy, which was in the right upper quadrant.  I created a length of small bowel about 15 cm that I could use for the anastomosis.  The small bowel was somewhat tethered to the right abdomen in the mass of adhesions.  I mobilized the splenic flexure of the colon so the transverse colon could be shifted to the right side of abdomen.  I got into the lesser sac and mobilized the left transverse colon down to the splenic flexure.   I thought I could now bring the small bowel to the transverse colon without tension.     I did an end of small bowel to side of transverse colon anastomosis.  I used interrupted 2-0 silk sutures for anastomosis.  This created a 2.0 cm anastomosis, that looked good and was not under tension.   I laid the omentum over the anastomosis.   I irrigated the abdomen with 2 liters of saline.  We changed gloves, gown, insturments, and started our closure.  I infiltrated local (20 cc Exparel and 20 cc of 1/4% marcaine) in the wound.  I then closed the midline wound and the ileostomy wound with interrupted #1 Novafil.   I closed the skin with staples.  Placed wicks in the wound.  And sterilely dressed the wound.  The sponge and needle count were correct at the end of the operation.   The patient tolerated the procedure well.  Because of the length of the operation and his underlying  medical condition, I will place him in step down postop.  [name], MD, [facility] Surgery Pager: [phone] Office phone:  [phone]

## 2017-07-23 NOTE — Anesthesia Procedure Notes (Signed)
Procedure Name: Intubation Date/Time: 07/23/2017 11:45 AM Performed by: Lind Covert, CRNA Pre-anesthesia Checklist: Patient identified, Emergency Drugs available, Suction available, Patient being monitored and Timeout performed Patient Re-evaluated:Patient Re-evaluated prior to induction Oxygen Delivery Method: Circle system utilized Preoxygenation: Pre-oxygenation with 100% oxygen Induction Type: IV induction Laryngoscope Size: Mac and 4 Grade View: Grade I Tube type: Oral Tube size: 7.5 mm Number of attempts: 1 Airway Equipment and Method: Stylet Placement Confirmation: ETT inserted through vocal cords under direct vision,  positive ETCO2 and breath sounds checked- equal and bilateral Secured at: 22 cm Tube secured with: Tape Dental Injury: Teeth and Oropharynx as per pre-operative assessment

## 2017-07-23 NOTE — Interval H&P Note (Signed)
History and Physical Interval Note:  07/23/2017 7:07 AM  Derek [name]  has presented today for surgery, with the diagnosis of HIGH OUTPUT ILEOSTOMY  The various methods of treatment have been discussed with the patient and family.  Labs okay.  He is ready for reversal.  Wife and family are here.  After consideration of risks, benefits and other options for treatment, the patient has consented to  Procedure(s): LAPAROSCOPIC VS OPEN ILEOSTOMY REVERSAL  (N/A) as a surgical intervention .  The patient's history has been reviewed, patient examined, no change in status, stable for surgery.  I have reviewed the patient's chart and labs.  Questions were answered to the patient's satisfaction.     Shann Medal

## 2017-07-23 NOTE — Anesthesia Preprocedure Evaluation (Addendum)
Anesthesia Evaluation  Patient identified by MRN, date of birth, ID band Patient awake    Reviewed: Allergy & Precautions, NPO status , Patient's Chart, lab work & pertinent test results  Airway Mallampati: III  TM Distance: >3 FB Neck ROM: Full    Dental  (+) Chipped, Dental Advisory Given, Poor Dentition,    Pulmonary former smoker,    breath sounds clear to auscultation       Cardiovascular hypertension, + CAD and + Peripheral Vascular Disease  + dysrhythmias Atrial Fibrillation + Valvular Problems/Murmurs  Rhythm:Regular Rate:Normal     Neuro/Psych PSYCHIATRIC DISORDERS Anxiety Depression  Neuromuscular disease    GI/Hepatic hiatal hernia, GERD  ,(+) Hepatitis -, C  Endo/Other    Renal/GU CRFRenal disease     Musculoskeletal  (+) Arthritis ,   Abdominal Normal abdominal exam  (+)   Peds  Hematology   Anesthesia Other Findings   Reproductive/Obstetrics                           Lab Results  Component Value Date   WBC 4.8 07/22/2017   HGB 12.8 (L) 07/22/2017   HCT 37.5 (L) 07/22/2017   MCV 87.8 07/22/2017   PLT 105 (L) 07/22/2017   Lab Results  Component Value Date   CREATININE 1.64 (H) 07/22/2017   BUN 14 07/22/2017   NA 140 07/22/2017   K 4.3 07/22/2017   CL 107 07/22/2017   CO2 25 07/22/2017   Lab Results  Component Value Date   INR 1.04 06/02/2017   INR 1.00 05/07/2017   INR 1.03 05/26/2016   PROTIME 16.2 (H) 01/18/2006   PROTIME 18.2 (H) 01/15/2006   PROTIME 11.5 01/11/2006   EKG: normal sinus rhythm, PAC's.   Anesthesia Physical Anesthesia Plan  ASA: III  Anesthesia Plan: General   Post-op Pain Management:    Induction: Intravenous  PONV Risk Score and Plan: 3 and Ondansetron, Dexamethasone and Treatment may vary due to age or medical condition  Airway Management Planned: Oral ETT  Additional Equipment: None  Intra-op Plan:   Post-operative Plan:  Extubation in OR  Informed Consent: I have reviewed the patients History and Physical, chart, labs and discussed the procedure including the risks, benefits and alternatives for the proposed anesthesia with the patient or authorized representative who has indicated his/her understanding and acceptance.   Dental advisory given  Plan Discussed with: CRNA  Anesthesia Plan Comments:         Anesthesia Quick Evaluation

## 2017-07-23 NOTE — Anesthesia Postprocedure Evaluation (Signed)
Anesthesia Post Note  Patient: Derek [name]  Procedure(s) Performed: ILEOSTOMY REVERSAL  (N/A Abdomen)     Patient location during evaluation: PACU Anesthesia Type: General Level of consciousness: awake and alert Pain management: pain level controlled Vital Signs Assessment: post-procedure vital signs reviewed and stable Respiratory status: spontaneous breathing, nonlabored ventilation, respiratory function stable and patient connected to face mask oxygen Cardiovascular status: blood pressure returned to baseline and stable Postop Assessment: no apparent nausea or vomiting Anesthetic complications: no    Last Vitals:  Vitals:   07/23/17 1215 07/23/17 1230  BP: (P) 113/66 109/64  Pulse: (P) 83 85  Resp: (P) 18 12  Temp:    SpO2: (P) 100% 97%    Last Pain:  Vitals:   07/23/17 1215  TempSrc:   PainSc: (P) 8                  Jakeel Starliper A.

## 2017-07-23 NOTE — Transfer of Care (Signed)
Immediate Anesthesia Transfer of Care Note  Patient: Derek [name]  Procedure(s) Performed: ILEOSTOMY REVERSAL  (N/A Abdomen)  Patient Location: PACU  Anesthesia Type:General  Level of Consciousness: sedated  Airway & Oxygen Therapy: Patient Spontanous Breathing and Patient connected to face mask oxygen  Post-op Assessment: Report given to RN and Post -op Vital signs reviewed and stable  Post vital signs: Reviewed and stable  Last Vitals:  Vitals:   07/22/17 2105 07/23/17 0541  BP: 131/82 129/86  Pulse: 63 67  Resp: 20 18  Temp: 36.6 C 36.4 C  SpO2: 100% 98%    Last Pain:  Vitals:   07/23/17 0541  TempSrc: Oral  PainSc:       Patients Stated Pain Goal: 1 (62/82/41 7530)  Complications: No apparent anesthesia complications

## 2017-07-24 LAB — BASIC METABOLIC PANEL
Anion gap: 7 (ref 5–15)
BUN: 12 mg/dL (ref 6–20)
CHLORIDE: 102 mmol/L (ref 101–111)
CO2: 23 mmol/L (ref 22–32)
CREATININE: 1.47 mg/dL — AB (ref 0.61–1.24)
Calcium: 7.6 mg/dL — ABNORMAL LOW (ref 8.9–10.3)
GFR calc Af Amer: 52 mL/min — ABNORMAL LOW (ref >60)
GFR calc non Af Amer: 45 mL/min — ABNORMAL LOW (ref >60)
Glucose, Bld: 124 mg/dL — ABNORMAL HIGH (ref 65–99)
POTASSIUM: 5.6 mmol/L — AB (ref 3.5–5.1)
Sodium: 132 mmol/L — ABNORMAL LOW (ref 135–145)

## 2017-07-24 LAB — CBC
HEMATOCRIT: 34.1 % — AB (ref 39.0–52.0)
Hemoglobin: 11.6 g/dL — ABNORMAL LOW (ref 13.0–17.0)
MCH: 29.6 pg (ref 26.0–34.0)
MCHC: 34 g/dL (ref 30.0–36.0)
MCV: 87 fL (ref 78.0–100.0)
PLATELETS: 104 10*3/uL — AB (ref 150–400)
RBC: 3.92 MIL/uL — ABNORMAL LOW (ref 4.22–5.81)
RDW: 15.2 % (ref 11.5–15.5)
WBC: 10.2 10*3/uL (ref 4.0–10.5)

## 2017-07-24 LAB — POTASSIUM: Potassium: 4.8 mmol/L (ref 3.5–5.1)

## 2017-07-24 MED ORDER — PROMETHAZINE HCL 25 MG/ML IJ SOLN: 12.5000 mg | Freq: Four times a day (QID) | INTRAMUSCULAR | Status: DC | PRN

## 2017-07-24 MED ORDER — PAROXETINE HCL 20 MG PO TABS
20.0000 mg | ORAL_TABLET | Freq: Every day | ORAL | Status: DC
  Administered 2017-07-24 – 2017-07-30 (×7): 20 mg via ORAL
  Filled 2017-07-24 (×7): qty 1

## 2017-07-24 MED ORDER — TRAZODONE HCL 100 MG PO TABS
100.0000 mg | ORAL_TABLET | Freq: Every day | ORAL | Status: DC
  Administered 2017-07-24 – 2017-07-30 (×7): 100 mg via ORAL
  Filled 2017-07-24 (×6): qty 1
  Filled 2017-07-24: qty 2

## 2017-07-24 MED ORDER — ACETAMINOPHEN 10 MG/ML IV SOLN
1000.0000 mg | Freq: Four times a day (QID) | INTRAVENOUS | Status: AC
  Administered 2017-07-24 – 2017-07-25 (×4): 1000 mg via INTRAVENOUS
  Filled 2017-07-24 (×5): qty 100

## 2017-07-24 NOTE — Progress Notes (Addendum)
1 Day Post-Op   Subjective/Chief Complaint: Throat sore, some nausea, no bowel function   Objective: Vital signs in last 24 hours: Temp:  [97.8 F (36.6 C)-99.5 F (37.5 C)] 99 F (37.2 C) (12/22 0800) Pulse Rate:  [83-95] 86 (12/22 0800) Resp:  [10-34] 25 (12/22 0800) BP: (104-175)/(64-91) 143/76 (12/22 0800) SpO2:  [89 %-100 %] 89 % (12/22 0800) Last BM Date: (ileostomy)  Intake/Output from previous day: 12/21 0701 - 12/22 0700 In: 5210.4 [I.V.:4960.4; IV Piggyback:250] Out: 2795 [Urine:2320; Emesis/NG output:125; Blood:350] Intake/Output this shift: No intake/output data recorded.  General appearance: no distress Resp: clear to auscultation bilaterally Cardio: regular rate and rhythm GI: dressing dry, no bs appropr tender  Lab Results:  Recent Labs    07/23/17 1339 07/24/17 0500  WBC 6.2 10.2  HGB 11.7* 11.6*  HCT 33.8* 34.1*  PLT 77* 104*   BMET Recent Labs    07/22/17 1926 07/24/17 0500  NA 140 132*  K 4.3 5.6*  CL 107 102  CO2 25 23  GLUCOSE 104* 124*  BUN 14 12  CREATININE 1.64* 1.47*  CALCIUM 8.2* 7.6*   PT/INR No results for input(s): LABPROT, INR in the last 72 hours. ABG No results for input(s): PHART, HCO3 in the last 72 hours.  Invalid input(s): PCO2, PO2  Studies/Results: No results found.  Anti-infectives: Anti-infectives (From admission, onward)   Start     Dose/Rate Route Frequency Ordered Stop   07/23/17 0800  cefTRIAXone (ROCEPHIN) 2 g in dextrose 5 % 50 mL IVPB     2 g 100 mL/hr over 30 Minutes Intravenous Every 24 hours 07/22/17 1906 07/29/17 0759   07/23/17 0600  cefoTEtan (CEFOTAN) 2 g in dextrose 5 % 50 mL IVPB     2 g 100 mL/hr over 30 Minutes Intravenous On call to O.R. 07/22/17 1821 07/23/17 0732   07/22/17 2200  sulfamethoxazole-trimethoprim (BACTRIM,SEPTRA) 400-80 MG per tablet 1 tablet  Status:  Discontinued     1 tablet Oral Every 12 hours 07/22/17 1821 07/23/17 1412   07/22/17 1936  minocycline (DYNACIN) tablet  100 mg  Status:  Discontinued     100 mg Oral 2 times daily PRN 07/22/17 1937 07/22/17 1944      Assessment/Plan: POD 1 ileostomy takedown/loa  1. Continue iv pain meds, will do ofirmev for 24 hours (would not give nsaids due to anastomosis and cr) for better pain control 2. pulm toilet, continue home cardiac meds 3. Remain npo until bowel function, continue ng tube 4. Will recheck potassium this am, 5.6 not sure accurate 5, sq heparin, scds 6. Remain stepdown until am tomorrow 7. Id- per [name] written for six doses of rocephin  Rolm Bookbinder 07/24/2017

## 2017-07-24 NOTE — Plan of Care (Signed)
  Activity: Risk for activity intolerance will decrease 07/24/2017 2493 - Progressing by Dorene Sorrow, RN   Nutrition: Adequate nutrition will be maintained 07/24/2017 2419 - Progressing by Dorene Sorrow, RN   Elimination: Will not experience complications related to bowel motility 07/24/2017 0812 - Progressing by Dorene Sorrow, RN

## 2017-07-25 LAB — CBC WITH DIFFERENTIAL/PLATELET
BASOS ABS: 0 10*3/uL (ref 0.0–0.1)
Basophils Relative: 0 %
EOS PCT: 1 %
Eosinophils Absolute: 0 10*3/uL (ref 0.0–0.7)
HEMATOCRIT: 33.2 % — AB (ref 39.0–52.0)
HEMOGLOBIN: 11.2 g/dL — AB (ref 13.0–17.0)
LYMPHS PCT: 8 %
Lymphs Abs: 0.7 10*3/uL (ref 0.7–4.0)
MCH: 29.8 pg (ref 26.0–34.0)
MCHC: 33.7 g/dL (ref 30.0–36.0)
MCV: 88.3 fL (ref 78.0–100.0)
Monocytes Absolute: 0.6 10*3/uL (ref 0.1–1.0)
Monocytes Relative: 7 %
NEUTROS ABS: 7.2 10*3/uL (ref 1.7–7.7)
NEUTROS PCT: 84 %
PLATELETS: 105 10*3/uL — AB (ref 150–400)
RBC: 3.76 MIL/uL — AB (ref 4.22–5.81)
RDW: 15.6 % — ABNORMAL HIGH (ref 11.5–15.5)
WBC: 8.5 10*3/uL (ref 4.0–10.5)

## 2017-07-25 LAB — BASIC METABOLIC PANEL
ANION GAP: 7 (ref 5–15)
BUN: 13 mg/dL (ref 6–20)
CHLORIDE: 103 mmol/L (ref 101–111)
CO2: 25 mmol/L (ref 22–32)
Calcium: 8 mg/dL — ABNORMAL LOW (ref 8.9–10.3)
Creatinine, Ser: 1.47 mg/dL — ABNORMAL HIGH (ref 0.61–1.24)
GFR, EST AFRICAN AMERICAN: 52 mL/min — AB (ref >60)
GFR, EST NON AFRICAN AMERICAN: 45 mL/min — AB (ref >60)
Glucose, Bld: 123 mg/dL — ABNORMAL HIGH (ref 65–99)
POTASSIUM: 5 mmol/L (ref 3.5–5.1)
SODIUM: 135 mmol/L (ref 135–145)

## 2017-07-25 MED ORDER — METHOCARBAMOL 1000 MG/10ML IJ SOLN
500.0000 mg | Freq: Three times a day (TID) | INTRAVENOUS | Status: DC | PRN
  Filled 2017-07-25 (×2): qty 5

## 2017-07-25 MED ORDER — MENTHOL 3 MG MT LOZG
1.0000 | LOZENGE | OROMUCOSAL | Status: DC | PRN
  Administered 2017-07-25: 3 mg via ORAL
  Filled 2017-07-25: qty 9

## 2017-07-25 MED ORDER — DEXTROSE-NACL 5-0.45 % IV SOLN
INTRAVENOUS | Status: DC
  Administered 2017-07-25 – 2017-07-31 (×5): via INTRAVENOUS

## 2017-07-25 MED ORDER — ACETAMINOPHEN 10 MG/ML IV SOLN
1000.0000 mg | Freq: Four times a day (QID) | INTRAVENOUS | Status: AC
  Administered 2017-07-25 – 2017-07-26 (×4): 1000 mg via INTRAVENOUS
  Filled 2017-07-25 (×4): qty 100

## 2017-07-25 MED ORDER — PHENOL 1.4 % MT LIQD: 1.0000 | OROMUCOSAL | Status: DC | PRN

## 2017-07-25 MED ORDER — CHLORHEXIDINE GLUCONATE CLOTH 2 % EX PADS
6.0000 | MEDICATED_PAD | Freq: Every day | CUTANEOUS | Status: DC
  Administered 2017-07-25 – 2017-07-31 (×5): 6 via TOPICAL

## 2017-07-25 NOTE — Progress Notes (Signed)
2 Days Post-Op   Subjective/Chief Complaint: Throat sore, no nausea today, had several loose bms   Objective: Vital signs in last 24 hours: Temp:  [97.6 F (36.4 C)-99.2 F (37.3 C)] 99.2 F (37.3 C) (12/23 0745) Pulse Rate:  [82-108] 95 (12/23 0700) Resp:  [18-35] 27 (12/23 0700) BP: (139-183)/(68-96) 154/72 (12/23 0700) SpO2:  [88 %-94 %] 90 % (12/23 0700) Last BM Date: 07/23/17  Intake/Output from previous day: 12/22 0701 - 12/23 0700 In: 3190.8 [I.V.:2670.8; NG/GT:120; IV Piggyback:400] Out: 2200 [Urine:2100; Emesis/NG output:100] Intake/Output this shift: No intake/output data recorded.   General appearance: no distress Resp: clear to auscultation bilaterally Cardio: regular rate and rhythm GI: wicks in place at incisions, no infection few bs appropr tender, actually pretty soft    Lab Results:  Recent Labs    07/24/17 0500 07/25/17 0508  WBC 10.2 8.5  HGB 11.6* 11.2*  HCT 34.1* 33.2*  PLT 104* 105*   BMET Recent Labs    07/24/17 0500 07/24/17 1750 07/25/17 0508  NA 132*  --  135  K 5.6* 4.8 5.0  CL 102  --  103  CO2 23  --  25  GLUCOSE 124*  --  123*  BUN 12  --  13  CREATININE 1.47*  --  1.47*  CALCIUM 7.6*  --  8.0*   PT/INR No results for input(s): LABPROT, INR in the last 72 hours. ABG No results for input(s): PHART, HCO3 in the last 72 hours.  Invalid input(s): PCO2, PO2  Studies/Results: No results found.  Anti-infectives: Anti-infectives (From admission, onward)   Start     Dose/Rate Route Frequency Ordered Stop   07/23/17 0800  cefTRIAXone (ROCEPHIN) 2 g in dextrose 5 % 50 mL IVPB     2 g 100 mL/hr over 30 Minutes Intravenous Every 24 hours 07/22/17 1906 07/29/17 0759   07/23/17 0600  cefoTEtan (CEFOTAN) 2 g in dextrose 5 % 50 mL IVPB     2 g 100 mL/hr over 30 Minutes Intravenous On call to O.R. 07/22/17 1821 07/23/17 0732   07/22/17 2200  sulfamethoxazole-trimethoprim (BACTRIM,SEPTRA) 400-80 MG per tablet 1 tablet  Status:   Discontinued     1 tablet Oral Every 12 hours 07/22/17 1821 07/23/17 1412   07/22/17 1936  minocycline (DYNACIN) tablet 100 mg  Status:  Discontinued     100 mg Oral 2 times daily PRN 07/22/17 1937 07/22/17 1944      Assessment/Plan: POD 2 ileostomy takedown/loa  1. Continue iv pain meds, will do ofirmev for 24 hours again (would not give nsaids due to anastomosis and cr) for better pain control, add robaxin 2. pulm toilet, continue home cardiac meds 3. Remain npo until bowel function, will dc ng today but he cannot have a diet, he does have some clinical and historical evidence of bowel function despite long loa 4. Electrolytes ok today, decrease iv fluids, 2100 cc urine out yesterday 5, sq heparin, scds 6. tx to floor today 7. Id- per [name] written for six doses of rocephin     Rolm Bookbinder 07/25/2017

## 2017-07-26 LAB — BASIC METABOLIC PANEL
Anion gap: 10 (ref 5–15)
BUN: 14 mg/dL (ref 6–20)
CHLORIDE: 100 mmol/L — AB (ref 101–111)
CO2: 25 mmol/L (ref 22–32)
CREATININE: 1.54 mg/dL — AB (ref 0.61–1.24)
Calcium: 8.3 mg/dL — ABNORMAL LOW (ref 8.9–10.3)
GFR calc non Af Amer: 43 mL/min — ABNORMAL LOW (ref >60)
GFR, EST AFRICAN AMERICAN: 50 mL/min — AB (ref >60)
Glucose, Bld: 102 mg/dL — ABNORMAL HIGH (ref 65–99)
POTASSIUM: 4.1 mmol/L (ref 3.5–5.1)
Sodium: 135 mmol/L (ref 135–145)

## 2017-07-26 NOTE — Progress Notes (Signed)
3 Days Post-Op   Subjective/Chief Complaint: Throat sore, no nausea today, continues to have loose bms   Objective: Vital signs in last 24 hours: Temp:  [98 F (36.7 C)-99.2 F (37.3 C)] 98.1 F (36.7 C) (12/24 0603) Pulse Rate:  [77-97] 77 (12/24 0603) Resp:  [13-33] 15 (12/24 0603) BP: (106-161)/(68-90) 106/68 (12/24 0603) SpO2:  [89 %-98 %] 97 % (12/24 0603) Last BM Date: 07/25/17  Intake/Output from previous day: No intake/output data recorded. Intake/Output this shift: No intake/output data recorded.   General appearance: no distress Resp: clear to auscultation bilaterally Cardio: regular rate and rhythm GI: wicks in place at incisions, no infection few bs appropr tender, actually pretty soft    Lab Results:  Recent Labs    07/24/17 0500 07/25/17 0508  WBC 10.2 8.5  HGB 11.6* 11.2*  HCT 34.1* 33.2*  PLT 104* 105*   BMET Recent Labs    07/25/17 0508 07/26/17 0426  NA 135 135  K 5.0 4.1  CL 103 100*  CO2 25 25  GLUCOSE 123* 102*  BUN 13 14  CREATININE 1.47* 1.54*  CALCIUM 8.0* 8.3*   PT/INR No results for input(s): LABPROT, INR in the last 72 hours. ABG No results for input(s): PHART, HCO3 in the last 72 hours.  Invalid input(s): PCO2, PO2  Studies/Results: No results found.  Anti-infectives: Anti-infectives (From admission, onward)   Start     Dose/Rate Route Frequency Ordered Stop   07/23/17 0800  cefTRIAXone (ROCEPHIN) 2 g in dextrose 5 % 50 mL IVPB     2 g 100 mL/hr over 30 Minutes Intravenous Every 24 hours 07/22/17 1906 07/29/17 0759   07/23/17 0600  cefoTEtan (CEFOTAN) 2 g in dextrose 5 % 50 mL IVPB     2 g 100 mL/hr over 30 Minutes Intravenous On call to O.R. 07/22/17 1821 07/23/17 0732   07/22/17 2200  sulfamethoxazole-trimethoprim (BACTRIM,SEPTRA) 400-80 MG per tablet 1 tablet  Status:  Discontinued     1 tablet Oral Every 12 hours 07/22/17 1821 07/23/17 1412   07/22/17 1936  minocycline (DYNACIN) tablet 100 mg  Status:   Discontinued     100 mg Oral 2 times daily PRN 07/22/17 1937 07/22/17 1944      Assessment/Plan: POD 3 ileostomy takedown/loa  1. Continue iv pain meds, for pain control, robaxin 2. pulm toilet, continue home cardiac meds 3. Will start clears today as he seems to have bowel function 4. Cont iv fluids,UOP not recorded 5, sq heparin, scds 6. PT eval 7. Id- per [name] written for six doses of rocephin     Derek [name] C. 43/73/5789

## 2017-07-27 MED ORDER — HYDROCODONE-ACETAMINOPHEN 5-325 MG PO TABS
1.0000 | ORAL_TABLET | ORAL | Status: DC | PRN
  Administered 2017-07-27 – 2017-07-31 (×14): 2 via ORAL
  Filled 2017-07-27 (×14): qty 2

## 2017-07-27 NOTE — Progress Notes (Signed)
4 Days Post-Op   Subjective/Chief Complaint: Tolerating clears, continues to have loose bms    Objective: Vital signs in last 24 hours: Temp:  [98.4 F (36.9 C)-98.9 F (37.2 C)] 98.4 F (36.9 C) (12/25 0503) Pulse Rate:  [82-86] 82 (12/25 0503) Resp:  [15-16] 15 (12/25 0503) BP: (113-130)/(68-79) 113/71 (12/25 0503) SpO2:  [94 %-96 %] 96 % (12/25 0503) Last BM Date: 07/26/17  Intake/Output from previous day: 12/24 0701 - 12/25 0700 In: 2335.8 [P.O.:880; I.V.:1355.8; IV Piggyback:100] Out: -  Intake/Output this shift: No intake/output data recorded.   General appearance: no distress Cardio: regular rate and rhythm GI: wicks in place at incisions, no infection few bs appropr tender, soft    Lab Results:  Recent Labs    07/25/17 0508  WBC 8.5  HGB 11.2*  HCT 33.2*  PLT 105*   BMET Recent Labs    07/25/17 0508 07/26/17 0426  NA 135 135  K 5.0 4.1  CL 103 100*  CO2 25 25  GLUCOSE 123* 102*  BUN 13 14  CREATININE 1.47* 1.54*  CALCIUM 8.0* 8.3*   PT/INR No results for input(s): LABPROT, INR in the last 72 hours. ABG No results for input(s): PHART, HCO3 in the last 72 hours.  Invalid input(s): PCO2, PO2  Studies/Results: No results found.  Anti-infectives: Anti-infectives (From admission, onward)   Start     Dose/Rate Route Frequency Ordered Stop   07/23/17 0800  cefTRIAXone (ROCEPHIN) 2 g in dextrose 5 % 50 mL IVPB     2 g 100 mL/hr over 30 Minutes Intravenous Every 24 hours 07/22/17 1906 07/29/17 0759   07/23/17 0600  cefoTEtan (CEFOTAN) 2 g in dextrose 5 % 50 mL IVPB     2 g 100 mL/hr over 30 Minutes Intravenous On call to O.R. 07/22/17 1821 07/23/17 0732   07/22/17 2200  sulfamethoxazole-trimethoprim (BACTRIM,SEPTRA) 400-80 MG per tablet 1 tablet  Status:  Discontinued     1 tablet Oral Every 12 hours 07/22/17 1821 07/23/17 1412   07/22/17 1936  minocycline (DYNACIN) tablet 100 mg  Status:  Discontinued     100 mg Oral 2 times daily PRN  07/22/17 1937 07/22/17 1944      Assessment/Plan: POD 4 ileostomy takedown/loa  1. Try PO pain meds, for pain control, robaxin 2. pulm toilet, continue home cardiac meds 3. Will start fulls today as he would like to advance his diet slowly 4. Decrease iv fluids,UOP not recorded 5, sq heparin, scds 6. PT eval 7. Id- per [name] written for six doses of rocephin     Korrie Hofbauer C. 19/37/9024

## 2017-07-27 NOTE — Evaluation (Signed)
Physical Therapy Evaluation Patient Details Name: Derek [name] MRN: [id number] DOB: 07/03/1941 Today's Date: 07/27/2017   History of Present Illness  Pt s/p reversal of ileostomy and with hx of PTSD, peripheral neuropathy, CKD, A-fib, L TSR and colon CA  Clinical Impression  Pt admitted as above and presenting with functional mobility limitations 2* abdominal pain.  Pt is currently mobilizing at sup - IND level.  Pt with no PT needs at this time and will sign off - RN advised and to continue ambulation in halls with pt.    Follow Up Recommendations No PT follow up    Equipment Recommendations  None recommended by PT    Recommendations for Other Services       Precautions / Restrictions Precautions Precautions: Other (comment) Precaution Comments: abdominal incision Restrictions Weight Bearing Restrictions: No      Mobility  Bed Mobility Overal bed mobility: Needs Assistance Bed Mobility: Supine to Sit;Sit to Supine     Supine to sit: Supervision Sit to supine: Supervision   General bed mobility comments: cues for log roll technique to decrease discomfort  Transfers Overall transfer level: Needs assistance Equipment used: None Transfers: Sit to/from Stand Sit to Stand: Supervision         General transfer comment: min cues for safe transition position and use of UEs to self assist  Ambulation/Gait Ambulation/Gait assistance: Min guard;Supervision;Independent Ambulation Distance (Feet): 500 Feet Assistive device: None Gait Pattern/deviations: Step-through pattern;Decreased step length - right;Decreased step length - left;Shuffle;Trunk flexed;Wide base of support Gait velocity: dec Gait velocity interpretation: Below normal speed for age/gender General Gait Details: min cues for posture.  Pt demonstrating good stability and no LOB including back stepping, side stepping and stork standing.  Stairs            Wheelchair Mobility    Modified Rankin  (Stroke Patients Only)       Balance Overall balance assessment: Independent                                           Pertinent Vitals/Pain Pain Assessment: 0-10 Pain Score: 5  Pain Location: abdomen Pain Descriptors / Indicators: Sore Pain Intervention(s): Limited activity within patient's tolerance;Monitored during session;Premedicated before session    Home Living Family/patient expects to be discharged to:: Private residence Living Arrangements: Spouse/significant other Available Help at Discharge: Family Type of Home: House Home Access: Stairs to enter Entrance Stairs-Rails: Psychiatric nurse of Steps: 4 Home Layout: One level Home Equipment: Environmental consultant - 2 wheels;Cane - single point      Prior Function Level of Independence: Independent               Hand Dominance        Extremity/Trunk Assessment   Upper Extremity Assessment Upper Extremity Assessment: Overall WFL for tasks assessed    Lower Extremity Assessment Lower Extremity Assessment: Overall WFL for tasks assessed       Communication   Communication: No difficulties  Cognition Arousal/Alertness: Awake/alert Behavior During Therapy: WFL for tasks assessed/performed Overall Cognitive Status: Within Functional Limits for tasks assessed                                        General Comments      Exercises     Assessment/Plan  PT Assessment Patient needs continued PT services  PT Problem List Pain;Decreased activity tolerance;Decreased mobility       PT Treatment Interventions Gait training;Functional mobility training    PT Goals (Current goals can be found in the Care Plan section)  Acute Rehab PT Goals Patient Stated Goal: HOME PT Goal Formulation: All assessment and education complete, DC therapy    Frequency Min 1X/week   Barriers to discharge        Co-evaluation               AM-PAC PT "6 Clicks" Daily  Activity  Outcome Measure Difficulty turning over in bed (including adjusting bedclothes, sheets and blankets)?: A Little Difficulty moving from lying on back to sitting on the side of the bed? : A Lot Difficulty sitting down on and standing up from a chair with arms (e.g., wheelchair, bedside commode, etc,.)?: A Little Help needed moving to and from a bed to chair (including a wheelchair)?: None Help needed walking in hospital room?: None Help needed climbing 3-5 steps with a railing? : A Little 6 Click Score: 19    End of Session Equipment Utilized During Treatment: Gait belt Activity Tolerance: Patient tolerated treatment well Patient left: in bed;with call bell/phone within reach;with bed alarm set Nurse Communication: Mobility status PT Visit Diagnosis: Difficulty in walking, not elsewhere classified (R26.2)    Time: 7356-7014 PT Time Calculation (min) (ACUTE ONLY): 15 min   Charges:   PT Evaluation $PT Eval Low Complexity: 1 Low     PT G Codes:        Pg 103 013 1438   Reily Treloar 07/27/2017, 4:54 PM

## 2017-07-28 LAB — BASIC METABOLIC PANEL
Anion gap: 8 (ref 5–15)
BUN: 18 mg/dL (ref 6–20)
CHLORIDE: 105 mmol/L (ref 101–111)
CO2: 24 mmol/L (ref 22–32)
CREATININE: 1.7 mg/dL — AB (ref 0.61–1.24)
Calcium: 8.6 mg/dL — ABNORMAL LOW (ref 8.9–10.3)
GFR calc non Af Amer: 38 mL/min — ABNORMAL LOW (ref >60)
GFR, EST AFRICAN AMERICAN: 44 mL/min — AB (ref >60)
Glucose, Bld: 106 mg/dL — ABNORMAL HIGH (ref 65–99)
POTASSIUM: 3.7 mmol/L (ref 3.5–5.1)
Sodium: 137 mmol/L (ref 135–145)

## 2017-07-28 LAB — CBC
HEMATOCRIT: 31.1 % — AB (ref 39.0–52.0)
Hemoglobin: 10.5 g/dL — ABNORMAL LOW (ref 13.0–17.0)
MCH: 29.7 pg (ref 26.0–34.0)
MCHC: 33.8 g/dL (ref 30.0–36.0)
MCV: 88.1 fL (ref 78.0–100.0)
PLATELETS: 137 10*3/uL — AB (ref 150–400)
RBC: 3.53 MIL/uL — AB (ref 4.22–5.81)
RDW: 15.3 % (ref 11.5–15.5)
WBC: 5.1 10*3/uL (ref 4.0–10.5)

## 2017-07-28 MED ORDER — SULFAMETHOXAZOLE-TRIMETHOPRIM 400-80 MG PO TABS
1.0000 | ORAL_TABLET | Freq: Two times a day (BID) | ORAL | Status: DC
  Administered 2017-07-28 – 2017-07-31 (×7): 1 via ORAL
  Filled 2017-07-28 (×7): qty 1

## 2017-07-29 LAB — CBC WITH DIFFERENTIAL/PLATELET
Basophils Absolute: 0 10*3/uL (ref 0.0–0.1)
Basophils Relative: 0 %
EOS PCT: 2 %
Eosinophils Absolute: 0.1 10*3/uL (ref 0.0–0.7)
HEMATOCRIT: 32.5 % — AB (ref 39.0–52.0)
Hemoglobin: 10.9 g/dL — ABNORMAL LOW (ref 13.0–17.0)
LYMPHS ABS: 0.7 10*3/uL (ref 0.7–4.0)
LYMPHS PCT: 11 %
MCH: 29.3 pg (ref 26.0–34.0)
MCHC: 33.5 g/dL (ref 30.0–36.0)
MCV: 87.4 fL (ref 78.0–100.0)
MONO ABS: 0.5 10*3/uL (ref 0.1–1.0)
MONOS PCT: 8 %
NEUTROS ABS: 5 10*3/uL (ref 1.7–7.7)
Neutrophils Relative %: 79 %
PLATELETS: 170 10*3/uL (ref 150–400)
RBC: 3.72 MIL/uL — ABNORMAL LOW (ref 4.22–5.81)
RDW: 15 % (ref 11.5–15.5)
WBC: 6.3 10*3/uL (ref 4.0–10.5)

## 2017-07-29 LAB — BASIC METABOLIC PANEL
ANION GAP: 10 (ref 5–15)
BUN: 19 mg/dL (ref 6–20)
CHLORIDE: 102 mmol/L (ref 101–111)
CO2: 22 mmol/L (ref 22–32)
Calcium: 8.7 mg/dL — ABNORMAL LOW (ref 8.9–10.3)
Creatinine, Ser: 1.85 mg/dL — ABNORMAL HIGH (ref 0.61–1.24)
GFR calc Af Amer: 40 mL/min — ABNORMAL LOW (ref >60)
GFR, EST NON AFRICAN AMERICAN: 34 mL/min — AB (ref >60)
GLUCOSE: 141 mg/dL — AB (ref 65–99)
POTASSIUM: 3.2 mmol/L — AB (ref 3.5–5.1)
Sodium: 134 mmol/L — ABNORMAL LOW (ref 135–145)

## 2017-07-29 NOTE — Progress Notes (Signed)
[facility]:  [phone] General Surgery Progress Note   LOS: 7 days  POD -  6 Days Post-Op  Chief Complaint: High output Ileostomy  Assessment and Plan: 1.  Ileostomy reversal - 07/23/2017 - D. [place] with loose stools, but a little better.  Tolerating reg diet, but may have trouble keeping up with liquids intake.  2. Norcardia infection He last saw Dr. Johnnye Sima on 06/29/2017                         To continue the ceftriaxone until 08/01/2017.  Continue the bactrim until 11/01/2017.  3.MALIGNANT NEOPLASM OF TRANSVERSE COLON (C18.4) Story: Transvervse colectomy and Enterolysis of adhesions - 08/14/2016 - D. 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 Impression: Plan: 4. Anticoagulated - on Eliquis - 5 mg - BID  Still off Eliquis for now.  5.ASCENDING AORTIC ANEURYSM (I71.2) Impression: Followed by Dr. Servando Snare on 03/19/2016 Currently root is 5.1 cm - consider replacement at 5.5 cm 6.ATRIAL FIBRILLATION, CONTROLLED (I48.91) 7. Chronic renal insufficiency  Creatinine has bumped up some, will continue to follow and see if his PO intake will keep up 8. Tunneled central catheter on Friday, 05/07/2017 by IR. 9. Recurrent SBO's  10. History of Crohn's Prior ileocecectomy around 1978. He required a second operation. He had a fistula that took months to heal. He did not have straight forward surgery.  11. History of PE It souds like this occurred when he was hosptialized for a SBO - he was on coumadin for a year - he's had no trouble since then 12History of Hep C - from blood transfusion during his bowel resection in the 1970's 13. Nephrolithiasis 15. 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 Gout in right great toe - last attack several years 17.  DVT prophylaxis - SQ Heparin   Active Problems:   High output ileostomy (HCC)   Status post reversal of ileostomy   Subjective:  Doing okay, though he says that he is making less urine.  Talked about keeping up with fluid intake.  Objective:   Vitals:   07/29/17 0538 07/29/17 1331  BP: 112/74 (!) 104/58  Pulse: 76 78  Resp: 15 16  Temp: 98.1 F (36.7 C) 98 F (36.7 C)  SpO2: 96% 96%     Intake/Output from previous day:  12/26 0701 - 12/27 0700 In: 1986.3 [P.O.:1700; I.V.:236.3; IV Piggyback:50] Out: 100 [Urine:100]  Intake/Output this shift:  Total I/O In: 523.7 [P.O.:480; I.V.:43.7] Out: -    Physical Exam:   General: Older wl who is alert and oriented.    HEENT: Normal. Pupils equal. .   Lungs: Clear   Abdomen: Soft, BS present   Wound: Clean   Lab Results:    Recent Labs    07/28/17 0407 07/29/17 0820  WBC 5.1 6.3  HGB 10.5* 10.9*  HCT 31.1* 32.5*  PLT 137* 170    BMET   Recent Labs    07/28/17 0407 07/29/17 0820  NA 137 134*  K 3.7 3.2*  CL 105 102  CO2 24 22  GLUCOSE 106* 141*  BUN 18 19  CREATININE 1.70* 1.85*  CALCIUM 8.6* 8.7*    PT/INR  No results for input(s): LABPROT, INR in the last 72 hours.  ABG  No  results for input(s): PHART, HCO3 in the last 72 hours.  Invalid input(s): PCO2, PO2   Studies/Results:  No results found.   Anti-infectives:   Anti-infectives (From admission, onward)   Start     Dose/Rate Route Frequency Ordered Stop   07/28/17 1300  sulfamethoxazole-trimethoprim (BACTRIM,SEPTRA) 400-80 MG per tablet 1 tablet     1 tablet Oral Every 12 hours 07/28/17 1218     07/23/17 0800  cefTRIAXone (ROCEPHIN) 2 g in dextrose 5 % 50 mL IVPB     2 g 100 mL/hr over 30 Minutes Intravenous Every 24 hours 07/22/17 1906 07/29/17 0759   07/23/17 0600  cefoTEtan (CEFOTAN) 2 g in dextrose 5 % 50 mL IVPB     2  g 100 mL/hr over 30 Minutes Intravenous On call to O.R. 07/22/17 1821 07/23/17 0732   07/22/17 2200  sulfamethoxazole-trimethoprim (BACTRIM,SEPTRA) 400-80 MG per tablet 1 tablet  Status:  Discontinued     1 tablet Oral Every 12 hours 07/22/17 1821 07/23/17 1412   07/22/17 1936  minocycline (DYNACIN) tablet 100 mg  Status:  Discontinued     100 mg Oral 2 times daily PRN 07/22/17 1937 07/22/17 1944      [name], MD, FACS Pager: [facility]: [phone] 07/29/2017

## 2017-07-30 LAB — BASIC METABOLIC PANEL
ANION GAP: 8 (ref 5–15)
BUN: 20 mg/dL (ref 6–20)
CALCIUM: 8.8 mg/dL — AB (ref 8.9–10.3)
CO2: 22 mmol/L (ref 22–32)
Chloride: 105 mmol/L (ref 101–111)
Creatinine, Ser: 1.91 mg/dL — ABNORMAL HIGH (ref 0.61–1.24)
GFR calc Af Amer: 38 mL/min — ABNORMAL LOW (ref >60)
GFR, EST NON AFRICAN AMERICAN: 33 mL/min — AB (ref >60)
GLUCOSE: 111 mg/dL — AB (ref 65–99)
Potassium: 3.8 mmol/L (ref 3.5–5.1)
Sodium: 135 mmol/L (ref 135–145)

## 2017-07-30 MED ORDER — SODIUM CHLORIDE 0.9% FLUSH
10.0000 mL | INTRAVENOUS | Status: DC | PRN
Start: 2017-07-30 — End: 2017-07-31
  Administered 2017-07-31: 10 mL
  Filled 2017-07-30: qty 40

## 2017-07-30 NOTE — Progress Notes (Addendum)
[facility]:  [phone] General Surgery Progress Note   LOS: 8 days  POD -  7 Days Post-Op  Chief Complaint: High output Ileostomy  Assessment and Plan: 1.  Ileostomy reversal - 07/23/2017 - D. [place] with loose stools, but a little better.  Tolerating reg diet, but may have trouble keeping up with liquids intake. Creatinine has continued to climb - so will increase fluids and follow labs.  Will also speak with nephrology about plans for patient.  I have a page into Dr. Jonnie Finner.  Plan:  If creatinine stable or better - go home on 1 liter IVF per day (they have the IVFs already)  Follow up with Dr. Jimmy Footman (or his PA) next week.  See me in 2 weeks for wound check and staple removal.  2. Chronic renal insufficiency - followed by Dr. Lenna Sciara. [place]   Creatinine - 1.70 - 07/28/2017  1.85 - 07/29/2017 1.91 - 07/30/2017 3. Norcardia infection He last saw Dr. Johnnye Sima on 06/29/2017             To continue the ceftriaxone until 08/01/2017.  Continue the bactrim until 11/01/2017.  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  5. Anticoagulated - on Eliquis - 5 mg - BID  Still off Eliquis for now.  6.ASCENDING AORTIC ANEURYSM (I71.2) Impression: Followed by Dr. Servando Snare on 03/19/2016 Currently root is 5.1 cm - consider replacement at 5.5 cm 7.ATRIAL FIBRILLATION, CONTROLLED (I48.91)  8. Tunneled central catheter on Friday, 05/07/2017 by IR. 9. Recurrent SBO's  10. History of Crohn's Prior ileocecectomy around 1978. He required a second operation. He had a fistula that took  months to heal. He did not have straight forward surgery.  11. History of PE It souds like this occurred when he was hosptialized for a SBO - he was on coumadin for a year - he's had no trouble since then 12History of Hep C - from blood transfusion during his bowel resection in the 1970's 13. Nephrolithiasis 15. Post tramatic stress syndrome - from service Takes Paxil nightly 16. Gout in right great toe - last attack several years 17.  DVT prophylaxis - SQ Heparin   Active Problems:   High output ileostomy (HCC)   Status post reversal of ileostomy  Subjective:  Sounds like he is doing well taking po diet and well with liquids.  He's having about 6 to 8 stools per day.  More concentrated urine.  Wife, [name], at bedside.  Objective:   Vitals:   07/29/17 2200 07/30/17 0515  BP: 122/64 119/74  Pulse: 77 72  Resp: 20 20  Temp: 98 F (36.7 C) 98.2 F (36.8 C)  SpO2: 97% 96%     Intake/Output from previous day:  12/27 0701 - 12/28 0700 In: 1542.5 [P.O.:1300; I.V.:242.5] Out: 350 [Urine:350]  Intake/Output this shift:  Total I/O In: 940 [P.O.:940] Out: 250 [Urine:250]   Physical Exam:   General: Older WM who is alert and oriented.  In good spirits.   HEENT: Normal. Pupils equal. .   Lungs: Clear.  IV catheter in right upper chest.   Abdomen: Soft, BS present   Wound: Clean - I will leave the dressing off.   Lab Results:    Recent Labs  07/28/17 0407 07/29/17 0820  WBC 5.1 6.3  HGB 10.5* 10.9*  HCT 31.1* 32.5*  PLT 137* 170    BMET   Recent Labs    07/29/17 0820 07/30/17 0420  NA 134* 135  K 3.2* 3.8  CL 102 105  CO2 22 22  GLUCOSE 141* 111*  BUN 19 20  CREATININE 1.85* 1.91*  CALCIUM 8.7* 8.8*    PT/INR  No results for input(s): LABPROT, INR in the last 72 hours.  ABG  No results for input(s): PHART, HCO3 in the last 72 hours.  Invalid input(s): PCO2, PO2   Studies/Results:  No results  found.   Anti-infectives:   Anti-infectives (From admission, onward)   Start     Dose/Rate Route Frequency Ordered Stop   07/28/17 1300  sulfamethoxazole-trimethoprim (BACTRIM,SEPTRA) 400-80 MG per tablet 1 tablet     1 tablet Oral Every 12 hours 07/28/17 1218     07/23/17 0800  cefTRIAXone (ROCEPHIN) 2 g in dextrose 5 % 50 mL IVPB     2 g 100 mL/hr over 30 Minutes Intravenous Every 24 hours 07/22/17 1906 07/29/17 0759   07/23/17 0600  cefoTEtan (CEFOTAN) 2 g in dextrose 5 % 50 mL IVPB     2 g 100 mL/hr over 30 Minutes Intravenous On call to O.R. 07/22/17 1821 07/23/17 0732   07/22/17 2200  sulfamethoxazole-trimethoprim (BACTRIM,SEPTRA) 400-80 MG per tablet 1 tablet  Status:  Discontinued     1 tablet Oral Every 12 hours 07/22/17 1821 07/23/17 1412   07/22/17 1936  minocycline (DYNACIN) tablet 100 mg  Status:  Discontinued     100 mg Oral 2 times daily PRN 07/22/17 1937 07/22/17 1944      [name], MD, FACS Pager: [facility]: [phone] 07/30/2017

## 2017-07-30 NOTE — Consult Note (Addendum)
Renal Service Consult Note [facility]  Derek [name] 07/30/2017 Derek [name] Requesting Physician:  Derek [name]  Reason for Consult:  Acute on CKD3 HPI: The patient is a 76 y.o. year-old with hx of atrial fib, CKD 3, Crohn's disease, gout, BPH and colon cancer.  He had surgery for colon Ca resection which was complicated a few weeks later by ischemic bowel for which patient underwent resection of ischemic bowel with ileostomy.  He has suffered from high output ostomy issues and has required home IVF's on a regular basis.  This was accomplished through a PICC line , but in Aug/ Sept this pt developed Nocardia infection and PICC was removed and had been getting IV and po abx.  He was admitted this time for ostomy reversal.  Postop has had some persistent diarrhea and the creat has gone up from 1.5 >> 1.9.  Asked to see for A/ CRF.    Pt f/b Derek [name].  He says he has still had diarrhea since ostomy taken down, but it is getting better "every day".  No dysuria.  No SOB, no n/v.   Urine is dark.   ROS  denies CP  no joint pain   no HA  no blurry vision  no rash  no diarrhea  no nausea/ vomiting  no dysuria  no difficulty voiding  urine is dark   Past Medical History  Past Medical History:  Diagnosis Date  . Anemia   . Anxiety   . Aortic insufficiency    a. mild-mod by echo 09/2015.  . Arthritis    "knees; left shoulder" (09/21/2013)  . Ascending aortic aneurysm (HCC)    a. last measurement 5.3 cm 03/2016 -> f/u planned 09/2015 to continue to follow.  . Atrial fibrillation ([place])   . B12 deficiency    takes Vit 12 shot every 14days   . Cataract   . CKD (chronic kidney disease) stage 3, GFR 30-59 ml/min (HCC) 08/22/2011  . Clotting disorder ([place])   . Crohn's disease ([place])   . Depression   . Enlarged prostate   . Enteric hyperoxaluria 02/21/2016  . GERD (gastroesophageal reflux disease)    takes Omeprazole daily  . Gout    takes Uloric and Colchicine  daily  . Heart murmur   . Hepatitis C 1978   negtive RNA load - spontaneously cleared  . Hiatal hernia   . History of blood transfusion 1978; 1990's; ?   "w/bowel resection; S/P allupurinol; ?" (09/21/2013)  . History of colon polyps   . History of kidney stones   . History of MRSA infection 2010  . History of pulmonary embolism 2006   both legs and both lungs /notes 08/26/2008 (09/21/2013)  . History of small bowel obstruction   . History of staph infection 1978  . Hyperoxaluria    Intestinal  . Hypertension   . Insomnia    takes Trazodone nightly  . Internal hemorrhoids   . LV dysfunction    a. h/o EF 45-50% in 2015, normalized on subsequent echoes.  . Nephrolithiasis   . Pancreatitis 2010   elevated lipase and amylase, stranding in tail of pancreas, ? from Humira  . Pancytopenia    Hx of  . Peripheral neuropathy    takes Gabapentin daily  . Pneumonia    hx of   . Post-traumatic stress syndrome    takes Paxil nightly  . PTSD (post-traumatic stress disorder)   . Pulmonary nodule    a. 33m  by CT 05/2015, recommended f/u 6-12 months.  . RLS (restless legs syndrome)   . Rosacea conjunctivitis(372.31)    takes Minocin daily  . Secondary hyperparathyroidism ([place]) 02/21/2016  . Sinus bradycardia   . Skin cancer    "cut/burned off left ear and face" (09/21/2013)  . Small bowel obstruction ([place])   . Thrombocytopenia ([place])    hx of   Past Surgical History  Past Surgical History:  Procedure Laterality Date  . ANKLE SURGERY Right   . APPENDECTOMY  1978  . BOWEL RESECTION  1978 X 2  . CARDIOVERSION N/A 05/29/2016   Procedure: CARDIOVERSION;  Surgeon: Derek Klein, MD;  Location: [facility];  Service: Cardiovascular;  Laterality: N/A;  . CHOLECYSTECTOMY    . COLON RESECTION N/A 08/14/2016   Procedure: LAPAROSCOPIC RESECTION TRANSVERSE COLON;  Surgeon: Derek Overall, MD;  Location: [facility];  Service: General;  Laterality: N/A;  . COLON SURGERY    . COLONOSCOPY    .  ESOPHAGOGASTRODUODENOSCOPY    . EYE SURGERY     cataract surgery bilateral  . FOOT SURGERY Right    "took gout out"  . HEMICOLECTOMY Right   . ILEOCECETOMY  1978   Derek [name] 05/10/2000  (09/21/2013)  . ILEOSTOMY    . ILEOSTOMY CLOSURE N/A 07/23/2017   Procedure: ILEOSTOMY REVERSAL ;  Surgeon: Derek Overall, MD;  Location: [facility];  Service: General;  Laterality: N/A;  . INGUINAL HERNIA REPAIR Right   . IR FLUORO GUIDE CV LINE RIGHT  05/07/2017  . IR US GUIDE VASC ACCESS RIGHT  05/07/2017  . KNEE ARTHROSCOPY Left   . LAPAROTOMY N/A 08/27/2016   Procedure: EXPLORATORYLAPAROTOMY, LYSIS OF ADHESIONS, ILEOSTOMY, RIGHT COLECTOMY;  Surgeon: Derek Overall, MD;  Location: [facility];  Service: General;  Laterality: N/A;  . LIGAMENT REPAIR Left   . TEE WITHOUT CARDIOVERSION N/A 05/29/2016   Procedure: TRANSESOPHAGEAL ECHOCARDIOGRAM (TEE);  Surgeon: Derek Klein, MD;  Location: [place];  Service: Cardiovascular;  Laterality: N/A;  . TOTAL SHOULDER ARTHROPLASTY Left 09/21/2013  . TOTAL SHOULDER ARTHROPLASTY Left 09/21/2013   Procedure: LEFT TOTAL SHOULDER ARTHROPLASTY;  Surgeon: Derek Shutter, MD;  Location: [place];  Service: Orthopedics;  Laterality: Left;   Family History  Family History  Problem Relation Age of Onset  . Kidney disease Father   . Hypertension Father   . Aneurysm Mother   . Aneurysm Sister   . Esophageal cancer Neg Hx   . Stomach cancer Neg Hx   . Rectal cancer Neg Hx    Social History  reports that he quit smoking about 42 years ago. His smoking use included cigarettes. He has a 40.00 pack-year smoking history. He quit smokeless tobacco use about 39 years ago. His smokeless tobacco use included chew. He reports that he does not drink alcohol or use drugs. Allergies  Allergies  Allergen Reactions  . Lorazepam Other (See Comments)    Reaction:  Hallucinations   . Humira [Adalimumab] Other (See Comments)    Pt states that he got pancreatitis.     Home medications Prior to  Admission medications   Medication Sig Start Date End Date Taking? Authorizing Provider  Acidophilus Lactobacillus CAPS Take 1 capsule by mouth daily. 06/08/16  Yes Derek Mayer, MD  amiodarone (PACERONE) 200 MG tablet Take 1 tablet (200 mg total) by mouth daily. 11/05/16  Yes End, Harrell Gave, MD  calcium carbonate (TUMS - DOSED IN MG ELEMENTAL CALCIUM) 500 MG chewable tablet Chew 2 tablets by mouth 2 (two) times daily.  Yes [provider]  carboxymethylcellulose (REFRESH PLUS) 0.5 % SOLN Place 1 drop into both eyes 4 (four) times daily.   Yes [provider]  cefTRIAXone 2 g in dextrose 5 % 50 mL Inject 2 g into the vein daily. 06/03/17  Yes Derek Overall, MD  Cholecalciferol (VITAMIN D) 2000 units CAPS Take 2,000 Units by mouth daily.   Yes [provider]  cyanocobalamin (,VITAMIN B-12,) 1000 MCG/ML injection Inject 1,000 mcg into the muscle every 14 (fourteen) days.   Yes [provider]  cycloSPORINE (RESTASIS) 0.05 % ophthalmic emulsion Place 1 drop into both eyes 2 (two) times daily.    Yes [provider]  ELIQUIS 5 MG TABS tablet Take 1 tablet (5 mg total) by mouth 2 (two) times daily. 11/05/16  Yes End, Harrell Gave, MD  ferrous sulfate 325 (65 FE) MG tablet Take 325 mg by mouth daily with breakfast.   Yes [provider]  gabapentin (NEURONTIN) 100 MG capsule Take 100-200 mg by mouth See admin instructions. Take 100 mg by mouth in the morning and take 200 mg by mouth at bedtime   Yes [provider]  loperamide (IMODIUM) 2 MG capsule Take 4 mg by mouth 2 (two) times daily.   Yes [provider]  magnesium oxide (MAGNESIUM-OXIDE) 400 (241.3 Mg) MG tablet Take 800 mg by mouth 2 (two) times daily.    Yes [provider]  Multiple Vitamin (MULTIVITAMIN WITH MINERALS) TABS tablet Take 1 tablet by mouth daily.   Yes [provider]  omeprazole (PRILOSEC) 20 MG capsule Take 20 mg by mouth daily.     Yes  [provider]  PARoxetine (PAXIL) 20 MG tablet Take 20 mg by mouth at bedtime.   Yes [provider]  PRESCRIPTION MEDICATION Inject 1,000 mLs into the vein See admin instructions. Dextrose 5% in normal saline (0.9%) administered at 119m/hr over 6 hours   Yes [provider]  psyllium (METAMUCIL) 0.52 g capsule Take 0.52 g by mouth 2 (two) times daily.   Yes [provider]  sodium bicarbonate 650 MG tablet Take 1,300 mg by mouth 2 (two) times daily.    Yes [provider]  sulfamethoxazole-trimethoprim (BACTRIM,SEPTRA) 400-80 MG tablet Take 1 tablet by mouth every 12 (twelve) hours. 06/03/17  Yes NAlphonsa Overall MD  terazosin (HYTRIN) 2 MG capsule Take 2 mg by mouth at bedtime.    Yes [provider]  traZODone (DESYREL) 100 MG tablet Take 100 mg by mouth at bedtime.   Yes [provider]  clindamycin (CLEOCIN T) 1 % external solution Apply 1 application topically daily as needed (head breakouts).    [provider]  minocycline (DYNACIN) 100 MG tablet Take 100 mg by mouth 2 (two) times daily as needed (face breakout).    [provider]   Liver Function Tests No results for input(s): AST, ALT, ALKPHOS, BILITOT, PROT, ALBUMIN in the last 168 hours. No results for input(s): LIPASE, AMYLASE in the last 168 hours. CBC Recent Labs  Lab 07/25/17 0508 07/28/17 0407 07/29/17 0820  WBC 8.5 5.1 6.3  NEUTROABS 7.2  --  5.0  HGB 11.2* 10.5* 10.9*  HCT 33.2* 31.1* 32.5*  MCV 88.3 88.1 87.4  PLT 105* 137* 1885  Basic Metabolic Panel Recent Labs  Lab 07/24/17 0500 07/24/17 1750 07/25/17 0508 07/26/17 0426 07/28/17 0407 07/29/17 0820 07/30/17 0420  NA 132*  --  135 135 137 134* 135  K 5.6* 4.8 5.0 4.1 3.7  3.2* 3.8  CL 102  --  103 100* 105 102 105  CO2 23  --  25 25 24 22 22   GLUCOSE 124*  --  123* 102* 106* 141* 111*  BUN 12  --  13 14 18 19 20   CREATININE 1.47*  --  1.47* 1.54* 1.70* 1.85* 1.91*  CALCIUM  7.6*  --  8.0* 8.3* 8.6* 8.7* 8.8*   Iron/TIBC/Ferritin/ %Sat No results found for: IRON, TIBC, FERRITIN, IRONPCTSAT  Vitals:   07/29/17 1331 07/29/17 2200 07/30/17 0515 07/30/17 1345  BP: (!) 104/58 122/64 119/74 120/65  Pulse: 78 77 72 66  Resp: 16 20 20 18   Temp: 98 F (36.7 C) 98 F (36.7 C) 98.2 F (36.8 C) 98 F (36.7 C)  TempSrc: Oral Oral Oral Oral  SpO2: 96% 97% 96% 100%  Weight:      Height:       Exam Gen alert, elderly WDWN WM, no distress, lying flat No rash, cyanosis or gangrene Sclera anicteric, throat clear and slightly dry  No jvd or bruits Chest clear bilat RRR no MRG Abd soft ntnd no mass or ascites +bs, new midline wound intact w/ staples GU normal male MS no joint effusions or deformity Ext no LE edema, no wounds or ulcers Neuro is alert, Ox 3 , nf     Impression: 1. Acute renal failure on CKD 3- baseline creat 1.6 per pt's family.  Creat up 1.9 today.  Prob due to vol depletion.  He has not been septic or hypotensive, no nsaids/ ACEi or ARBs.  Urine is dark.  Plan ^ IVF"s to 200 cc/ hr.  In OP setting if he is dc'd soon he should get 1 L saline per day to maintain hydration for the next couple of weeks and possibly longer.  I have set up the pt for an appt on Wed Jan 2nd at 3:45 pm with our clinic PA/ MD.   2. SP ostomy takedown 3. Hx colon cancer.   4. Afib - on amio, eliquis 5. Nocardia PICC infection/ bacteremia - Aug 2018, completes IV abx this weekend, po Bactrim thru April   Plan - as above  Kelly Splinter MD [facility] pager [phone]   07/30/2017, 4:37 PM

## 2017-07-31 LAB — BASIC METABOLIC PANEL
ANION GAP: 7 (ref 5–15)
BUN: 19 mg/dL (ref 6–20)
CHLORIDE: 108 mmol/L (ref 101–111)
CO2: 20 mmol/L — ABNORMAL LOW (ref 22–32)
Calcium: 8.4 mg/dL — ABNORMAL LOW (ref 8.9–10.3)
Creatinine, Ser: 1.66 mg/dL — ABNORMAL HIGH (ref 0.61–1.24)
GFR, EST AFRICAN AMERICAN: 45 mL/min — AB (ref >60)
GFR, EST NON AFRICAN AMERICAN: 39 mL/min — AB (ref >60)
Glucose, Bld: 115 mg/dL — ABNORMAL HIGH (ref 65–99)
POTASSIUM: 3.8 mmol/L (ref 3.5–5.1)
SODIUM: 135 mmol/L (ref 135–145)

## 2017-07-31 MED ORDER — HYDROCODONE-ACETAMINOPHEN 5-325 MG PO TABS: 1.0000 | ORAL_TABLET | ORAL | 0 refills | Status: DC | PRN

## 2017-07-31 MED ORDER — ONDANSETRON HCL 4 MG PO TABS: 4.0000 mg | ORAL_TABLET | Freq: Four times a day (QID) | ORAL | 0 refills | Status: DC | PRN

## 2017-07-31 MED ORDER — HEPARIN SOD (PORK) LOCK FLUSH 100 UNIT/ML IV SOLN
250.0000 [IU] | INTRAVENOUS | Status: AC | PRN
  Administered 2017-07-31: 250 [IU]

## 2017-07-31 NOTE — Progress Notes (Signed)
8 Days Post-Op   Subjective/Chief Complaint: Tolerating his diet, continues to have loose bms but better    Objective: Vital signs in last 24 hours: Temp:  [98 F (36.7 C)-98.6 F (37 C)] 98.5 F (36.9 C) (12/29 0500) Pulse Rate:  [65-68] 68 (12/29 0500) Resp:  [18] 18 (12/29 0500) BP: (107-120)/(57-65) 107/57 (12/29 0500) SpO2:  [96 %-100 %] 100 % (12/29 0500) Last BM Date: 07/31/17  Intake/Output from previous day: 12/28 0701 - 12/29 0700 In: 4280 [P.O.:2380; I.V.:1900] Out: 375 [Urine:375] Intake/Output this shift: No intake/output data recorded.   General appearance: no distress Cardio: regular rate and rhythm GI: incision healing well, no infection, soft    Lab Results:  Recent Labs    07/29/17 0820  WBC 6.3  HGB 10.9*  HCT 32.5*  PLT 170   BMET Recent Labs    07/29/17 0820 07/30/17 0420  NA 134* 135  K 3.2* 3.8  CL 102 105  CO2 22 22  GLUCOSE 141* 111*  BUN 19 20  CREATININE 1.85* 1.91*  CALCIUM 8.7* 8.8*   PT/INR No results for input(s): LABPROT, INR in the last 72 hours. ABG No results for input(s): PHART, HCO3 in the last 72 hours.  Invalid input(s): PCO2, PO2  Studies/Results: No results found.  Anti-infectives: Anti-infectives (From admission, onward)   Start     Dose/Rate Route Frequency Ordered Stop   07/28/17 1300  sulfamethoxazole-trimethoprim (BACTRIM,SEPTRA) 400-80 MG per tablet 1 tablet     1 tablet Oral Every 12 hours 07/28/17 1218     07/23/17 0800  cefTRIAXone (ROCEPHIN) 2 g in dextrose 5 % 50 mL IVPB     2 g 100 mL/hr over 30 Minutes Intravenous Every 24 hours 07/22/17 1906 07/29/17 0759   07/23/17 0600  cefoTEtan (CEFOTAN) 2 g in dextrose 5 % 50 mL IVPB     2 g 100 mL/hr over 30 Minutes Intravenous On call to O.R. 07/22/17 1821 07/23/17 0732   07/22/17 2200  sulfamethoxazole-trimethoprim (BACTRIM,SEPTRA) 400-80 MG per tablet 1 tablet  Status:  Discontinued     1 tablet Oral Every 12 hours 07/22/17 1821 07/23/17 1412    07/22/17 1936  minocycline (DYNACIN) tablet 100 mg  Status:  Discontinued     100 mg Oral 2 times daily PRN 07/22/17 1937 07/22/17 1944      Assessment/Plan: POD 8 ileostomy takedown/loa  1. Cont PO pain meds 2. pulm toilet, continue home cardiac meds 3. Will recheck Cr today and plan for d/c if Cr <2.0 4. Pt has home health supplies for IVF's at home     [name], Derek. 71/01/2693

## 2017-07-31 NOTE — Progress Notes (Signed)
Pt was discharged home today. Instructions were reviewed with patient, and questions were answered. Pt was taken to main entrance via wheelchair by NT.  

## 2017-07-31 NOTE — Discharge Instructions (Signed)
[facility] - DISCHARGE INSTRUCTIONS TO PATIENT  Activity:  Driving - Don't drive until seen back in the office   Lifting - No lifting more than 15 pounds for 3 weeks.  Wound Care:   May wash incision.  Diet:  As tolerated.   Take plenty of water.  Infuse 1 L IV fluids daily  Follow up appointment:  Call Dr. Pollie Friar office [facility]) at [phone] for an appointment in 10 days to 2 weeks. You have follow up with Dr. Jimmy Footman.  Medications and dosages:  Resume your home medications.  You have a prescription for:  Vicodin      You have IVF at home for 1 liter per day.      You have your antibiotics for the Norcardia  Call Dr. [name] or his office  [phone]) if you have:  Temperature greater than 100.4,  Persistent nausea and vomiting,  Severe uncontrolled pain,  Redness, tenderness, or signs of infection (pain, swelling, redness, odor or green/yellow discharge around the site),  Difficulty breathing, headache or visual disturbances,  Any other questions or concerns you may have after discharge.  In an emergency, call 911 or go to an Emergency Department at a nearby hospital.

## 2017-08-02 NOTE — Progress Notes (Signed)
Received a call from Mr. Swett spouse stating she had contacted [facility] and there were no orders to have them come out to dress his PICC line. Reviewed the chart and there does not appear to be orders placed prior to d/c. Instructed the spouse to contact Dr. Pollie Friar office to see what they should do. [phone]

## 2017-08-04 DIAGNOSIS — D631 Anemia in chronic kidney disease: Secondary | ICD-10-CM | POA: Diagnosis not present

## 2017-08-04 DIAGNOSIS — K5090 Crohn's disease, unspecified, without complications: Secondary | ICD-10-CM | POA: Diagnosis not present

## 2017-08-04 DIAGNOSIS — N200 Calculus of kidney: Secondary | ICD-10-CM | POA: Diagnosis not present

## 2017-08-04 DIAGNOSIS — M109 Gout, unspecified: Secondary | ICD-10-CM | POA: Diagnosis not present

## 2017-08-04 DIAGNOSIS — R7881 Bacteremia: Secondary | ICD-10-CM | POA: Diagnosis not present

## 2017-08-04 DIAGNOSIS — N2581 Secondary hyperparathyroidism of renal origin: Secondary | ICD-10-CM | POA: Diagnosis not present

## 2017-08-04 DIAGNOSIS — I480 Paroxysmal atrial fibrillation: Secondary | ICD-10-CM | POA: Diagnosis not present

## 2017-08-04 DIAGNOSIS — E669 Obesity, unspecified: Secondary | ICD-10-CM | POA: Diagnosis not present

## 2017-08-04 DIAGNOSIS — N183 Chronic kidney disease, stage 3 (moderate): Secondary | ICD-10-CM | POA: Diagnosis not present

## 2017-08-04 DIAGNOSIS — B9562 Methicillin resistant Staphylococcus aureus infection as the cause of diseases classified elsewhere: Secondary | ICD-10-CM | POA: Diagnosis not present

## 2017-08-04 DIAGNOSIS — I129 Hypertensive chronic kidney disease with stage 1 through stage 4 chronic kidney disease, or unspecified chronic kidney disease: Secondary | ICD-10-CM | POA: Diagnosis not present

## 2017-08-04 DIAGNOSIS — C189 Malignant neoplasm of colon, unspecified: Secondary | ICD-10-CM | POA: Diagnosis not present

## 2017-08-04 DIAGNOSIS — A439 Nocardiosis, unspecified: Secondary | ICD-10-CM | POA: Diagnosis not present

## 2017-08-04 DIAGNOSIS — K50912 Crohn's disease, unspecified, with intestinal obstruction: Secondary | ICD-10-CM | POA: Diagnosis not present

## 2017-08-04 DIAGNOSIS — I4891 Unspecified atrial fibrillation: Secondary | ICD-10-CM | POA: Diagnosis not present

## 2017-08-04 DIAGNOSIS — N189 Chronic kidney disease, unspecified: Secondary | ICD-10-CM | POA: Diagnosis not present

## 2017-08-04 DIAGNOSIS — I719 Aortic aneurysm of unspecified site, without rupture: Secondary | ICD-10-CM | POA: Diagnosis not present

## 2017-08-05 ENCOUNTER — Ambulatory Visit: Payer: Medicare Other | Admitting: Cardiothoracic Surgery

## 2017-08-08 NOTE — Discharge Summary (Signed)
Physician Discharge Summary  Patient ID:  Derek [name]  MRN: [id number]  DOB/AGE: [birth year] 77 y.o.  Admit date: 07/22/2017 Discharge date: 07/31/2017  Discharge Diagnoses:  1.  High output ileostomy  2. Chronic renal insufficiency - followed by Dr. Lenna Sciara. Deterding           To go home on IV fluids with follow up with Dr. Jimmy Footman 3. Norcardia infection He last saw Dr. Johnnye Sima on 06/29/2017 To continue the ceftriaxone until 08/01/2017. Continue the bactrim until 11/01/2017.  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  5. Anticoagulated - on Eliquis - 5 mg - BID 6.ASCENDING AORTIC ANEURYSM (I71.2) Impression: Followed by Dr. Servando Snare on 03/19/2016 Currently root is 5.1 cm - consider replacement at 5.5 cm 7.ATRIAL FIBRILLATION, CONTROLLED (I48.91)  8. Tunneled central catheter on Friday, 05/07/2017 by IR. 9. Recurrent SBO's 10. History of Crohn's Prior ileocecectomy around 1978. He required a second operation. He had a fistula that took months to heal. He did not have straight forward surgery.  11. History of PE It souds like this occurred when he was hosptialized for a SBO - he was on coumadin for a year - he's had no trouble since then 12History of Hep C - from blood transfusion during his bowel resection in the 1970's 13. Nephrolithiasis 15. Post tramatic stress syndrome - from service Takes Paxil nightly 16. Gout in right great toe - last attack several years   Active Problems:   High output ileostomy ([place])   Status post reversal of  ileostomy   Operation: Procedure(s): ILEOSTOMY REVERSAL  on 07/23/2017 - D. [name]  Discharged Condition: good  Hospital Course: Derek [name] is an 77 y.o. male whose primary care physician is Sharilyn Sites, MD and who was admitted 07/22/2017 with a chief complaint of high out put ileostomy.   He was brought to the operating room on 07/23/2017 and underwent reversal of the ileostomy.   Because of the length of the operation and his underlying medical problems, [name] was placed in the step down unit post op.  He had a NGT for a couple of days and once he showed bowel function, the NGT was removed.  His wicks in his wound was removed in about 4 days post op.  His diet was rapidly advanced to regular food.  He experienced 6 to 8 loose stools a day, but this had been part of his bowel function before the ileostomy.  His creatinine did drift up some post op, implying that he was not keeping up with his liquid intake.  Dr. Mickel Crow saw him in consultation and arranged follow up with Dr. Jimmy Footman as an outpatient.  By the 8th post op day, he was doing well.  His wound looked good.  His oral intake was good.  And he was ready for discharge.  The discharge instructions were reviewed with the patient.  Consults: Renal service, Dr. Jonnie Finner  Significant Diagnostic Studies: Results for orders placed or performed during the hospital encounter of 07/22/17  MRSA PCR Screening  Result Value Ref Range   MRSA by PCR NEGATIVE NEGATIVE  Hemoglobin A1c  Result Value Ref Range   Hgb A1c MFr Bld 5.0 4.8 - 5.6 %   Mean Plasma  Glucose 96.8 mg/dL  CBC WITH DIFFERENTIAL  Result Value Ref Range   WBC 4.8 4.0 - 10.5 K/uL   RBC 4.27 4.22 - 5.81 MIL/uL   Hemoglobin 12.8 (L) 13.0 - 17.0 g/dL   HCT 37.5 (L) 39.0 - 52.0 %   MCV 87.8 78.0 - 100.0 fL   MCH 30.0 26.0 - 34.0 pg   MCHC 34.1 30.0 - 36.0 g/dL   RDW 15.0 11.5 - 15.5 %   Platelets 105 (L) 150 - 400 K/uL   Neutrophils Relative % 72 %    Lymphocytes Relative 17 %   Monocytes Relative 8 %   Eosinophils Relative 3 %   Basophils Relative 0 %   Neutro Abs 3.5 1.7 - 7.7 K/uL   Lymphs Abs 0.8 0.7 - 4.0 K/uL   Monocytes Absolute 0.4 0.1 - 1.0 K/uL   Eosinophils Absolute 0.1 0.0 - 0.7 K/uL   Basophils Absolute 0.0 0.0 - 0.1 K/uL   RBC Morphology ELLIPTOCYTES    Smear Review PLATELET COUNT CONFIRMED BY SMEAR   Comprehensive metabolic panel  Result Value Ref Range   Sodium 140 135 - 145 mmol/L   Potassium 4.3 3.5 - 5.1 mmol/L   Chloride 107 101 - 111 mmol/L   CO2 25 22 - 32 mmol/L   Glucose, Bld 104 (H) 65 - 99 mg/dL   BUN 14 6 - 20 mg/dL   Creatinine, Ser 1.64 (H) 0.61 - 1.24 mg/dL   Calcium 8.2 (L) 8.9 - 10.3 mg/dL   Total Protein 6.4 (L) 6.5 - 8.1 g/dL   Albumin 3.7 3.5 - 5.0 g/dL   AST 31 15 - 41 U/L   ALT 24 17 - 63 U/L   Alkaline Phosphatase 128 (H) 38 - 126 U/L   Total Bilirubin 0.6 0.3 - 1.2 mg/dL   GFR calc non Af Amer 40 (L) >60 mL/min   GFR calc Af Amer 46 (L) >60 mL/min   Anion gap 8 5 - 15  CBC  Result Value Ref Range   WBC 6.2 4.0 - 10.5 K/uL   RBC 3.87 (L) 4.22 - 5.81 MIL/uL   Hemoglobin 11.7 (L) 13.0 - 17.0 g/dL   HCT 33.8 (L) 39.0 - 52.0 %   MCV 87.3 78.0 - 100.0 fL   MCH 30.2 26.0 - 34.0 pg   MCHC 34.6 30.0 - 36.0 g/dL   RDW 15.2 11.5 - 15.5 %   Platelets 77 (L) 150 - 400 K/uL  Basic metabolic panel  Result Value Ref Range   Sodium 132 (L) 135 - 145 mmol/L   Potassium 5.6 (H) 3.5 - 5.1 mmol/L   Chloride 102 101 - 111 mmol/L   CO2 23 22 - 32 mmol/L   Glucose, Bld 124 (H) 65 - 99 mg/dL   BUN 12 6 - 20 mg/dL   Creatinine, Ser 1.47 (H) 0.61 - 1.24 mg/dL   Calcium 7.6 (L) 8.9 - 10.3 mg/dL   GFR calc non Af Amer 45 (L) >60 mL/min   GFR calc Af Amer 52 (L) >60 mL/min   Anion gap 7 5 - 15  CBC  Result Value Ref Range   WBC 10.2 4.0 - 10.5 K/uL   RBC 3.92 (L) 4.22 - 5.81 MIL/uL   Hemoglobin 11.6 (L) 13.0 - 17.0 g/dL   HCT 34.1 (L) 39.0 - 52.0 %   MCV 87.0 78.0 - 100.0 fL   MCH 29.6 26.0 -  34.0 pg   MCHC 34.0 30.0 - 36.0 g/dL  RDW 15.2 11.5 - 15.5 %   Platelets 104 (L) 150 - 400 K/uL  Basic metabolic panel  Result Value Ref Range   Sodium 135 135 - 145 mmol/L   Potassium 5.0 3.5 - 5.1 mmol/L   Chloride 103 101 - 111 mmol/L   CO2 25 22 - 32 mmol/L   Glucose, Bld 123 (H) 65 - 99 mg/dL   BUN 13 6 - 20 mg/dL   Creatinine, Ser 1.47 (H) 0.61 - 1.24 mg/dL   Calcium 8.0 (L) 8.9 - 10.3 mg/dL   GFR calc non Af Amer 45 (L) >60 mL/min   GFR calc Af Amer 52 (L) >60 mL/min   Anion gap 7 5 - 15  CBC with Differential/Platelet  Result Value Ref Range   WBC 8.5 4.0 - 10.5 K/uL   RBC 3.76 (L) 4.22 - 5.81 MIL/uL   Hemoglobin 11.2 (L) 13.0 - 17.0 g/dL   HCT 33.2 (L) 39.0 - 52.0 %   MCV 88.3 78.0 - 100.0 fL   MCH 29.8 26.0 - 34.0 pg   MCHC 33.7 30.0 - 36.0 g/dL   RDW 15.6 (H) 11.5 - 15.5 %   Platelets 105 (L) 150 - 400 K/uL   Neutrophils Relative % 84 %   Neutro Abs 7.2 1.7 - 7.7 K/uL   Lymphocytes Relative 8 %   Lymphs Abs 0.7 0.7 - 4.0 K/uL   Monocytes Relative 7 %   Monocytes Absolute 0.6 0.1 - 1.0 K/uL   Eosinophils Relative 1 %   Eosinophils Absolute 0.0 0.0 - 0.7 K/uL   Basophils Relative 0 %   Basophils Absolute 0.0 0.0 - 0.1 K/uL  Potassium  Result Value Ref Range   Potassium 4.8 3.5 - 5.1 mmol/L  Basic metabolic panel  Result Value Ref Range   Sodium 135 135 - 145 mmol/L   Potassium 4.1 3.5 - 5.1 mmol/L   Chloride 100 (L) 101 - 111 mmol/L   CO2 25 22 - 32 mmol/L   Glucose, Bld 102 (H) 65 - 99 mg/dL   BUN 14 6 - 20 mg/dL   Creatinine, Ser 1.54 (H) 0.61 - 1.24 mg/dL   Calcium 8.3 (L) 8.9 - 10.3 mg/dL   GFR calc non Af Amer 43 (L) >60 mL/min   GFR calc Af Amer 50 (L) >60 mL/min   Anion gap 10 5 - 15  Basic metabolic panel  Result Value Ref Range   Sodium 137 135 - 145 mmol/L   Potassium 3.7 3.5 - 5.1 mmol/L   Chloride 105 101 - 111 mmol/L   CO2 24 22 - 32 mmol/L   Glucose, Bld 106 (H) 65 - 99 mg/dL   BUN 18 6 - 20 mg/dL   Creatinine, Ser 1.70 (H) 0.61 -  1.24 mg/dL   Calcium 8.6 (L) 8.9 - 10.3 mg/dL   GFR calc non Af Amer 38 (L) >60 mL/min   GFR calc Af Amer 44 (L) >60 mL/min   Anion gap 8 5 - 15  CBC  Result Value Ref Range   WBC 5.1 4.0 - 10.5 K/uL   RBC 3.53 (L) 4.22 - 5.81 MIL/uL   Hemoglobin 10.5 (L) 13.0 - 17.0 g/dL   HCT 31.1 (L) 39.0 - 52.0 %   MCV 88.1 78.0 - 100.0 fL   MCH 29.7 26.0 - 34.0 pg   MCHC 33.8 30.0 - 36.0 g/dL   RDW 15.3 11.5 - 15.5 %   Platelets 137 (L) 150 - 400 K/uL  Basic  metabolic panel  Result Value Ref Range   Sodium 134 (L) 135 - 145 mmol/L   Potassium 3.2 (L) 3.5 - 5.1 mmol/L   Chloride 102 101 - 111 mmol/L   CO2 22 22 - 32 mmol/L   Glucose, Bld 141 (H) 65 - 99 mg/dL   BUN 19 6 - 20 mg/dL   Creatinine, Ser 1.85 (H) 0.61 - 1.24 mg/dL   Calcium 8.7 (L) 8.9 - 10.3 mg/dL   GFR calc non Af Amer 34 (L) >60 mL/min   GFR calc Af Amer 40 (L) >60 mL/min   Anion gap 10 5 - 15  CBC with Differential/Platelet  Result Value Ref Range   WBC 6.3 4.0 - 10.5 K/uL   RBC 3.72 (L) 4.22 - 5.81 MIL/uL   Hemoglobin 10.9 (L) 13.0 - 17.0 g/dL   HCT 32.5 (L) 39.0 - 52.0 %   MCV 87.4 78.0 - 100.0 fL   MCH 29.3 26.0 - 34.0 pg   MCHC 33.5 30.0 - 36.0 g/dL   RDW 15.0 11.5 - 15.5 %   Platelets 170 150 - 400 K/uL   Neutrophils Relative % 79 %   Neutro Abs 5.0 1.7 - 7.7 K/uL   Lymphocytes Relative 11 %   Lymphs Abs 0.7 0.7 - 4.0 K/uL   Monocytes Relative 8 %   Monocytes Absolute 0.5 0.1 - 1.0 K/uL   Eosinophils Relative 2 %   Eosinophils Absolute 0.1 0.0 - 0.7 K/uL   Basophils Relative 0 %   Basophils Absolute 0.0 0.0 - 0.1 K/uL  Basic metabolic panel  Result Value Ref Range   Sodium 135 135 - 145 mmol/L   Potassium 3.8 3.5 - 5.1 mmol/L   Chloride 105 101 - 111 mmol/L   CO2 22 22 - 32 mmol/L   Glucose, Bld 111 (H) 65 - 99 mg/dL   BUN 20 6 - 20 mg/dL   Creatinine, Ser 1.91 (H) 0.61 - 1.24 mg/dL   Calcium 8.8 (L) 8.9 - 10.3 mg/dL   GFR calc non Af Amer 33 (L) >60 mL/min   GFR calc Af Amer 38 (L) >60 mL/min    Anion gap 8 5 - 15  Basic metabolic panel  Result Value Ref Range   Sodium 135 135 - 145 mmol/L   Potassium 3.8 3.5 - 5.1 mmol/L   Chloride 108 101 - 111 mmol/L   CO2 20 (L) 22 - 32 mmol/L   Glucose, Bld 115 (H) 65 - 99 mg/dL   BUN 19 6 - 20 mg/dL   Creatinine, Ser 1.66 (H) 0.61 - 1.24 mg/dL   Calcium 8.4 (L) 8.9 - 10.3 mg/dL   GFR calc non Af Amer 39 (L) >60 mL/min   GFR calc Af Amer 45 (L) >60 mL/min   Anion gap 7 5 - 15    No results found.  Discharge Exam:  Vitals:   07/30/17 2000 07/31/17 0500  BP: 114/63 (!) 107/57  Pulse: 65 68  Resp: 18 18  Temp: 98.6 F (37 C) 98.5 F (36.9 C)  SpO2: 96% 100%    General: WN older WM who is alert and generally healthy appearing.  Lungs: Clear to auscultation and symmetric breath sounds.  Tunneled catheter in right upper chest. Heart:  RRR. No murmur or rub. Abdomen: Soft. No mass. Normal bowel sounds.  His incision looked okay.  They were to remove the staples prior to discharge.  Discharge Medications:   Allergies as of 07/31/2017  Reactions   Lorazepam Other (See Comments)   Reaction:  Hallucinations    Humira [adalimumab] Other (See Comments)   Pt states that he got pancreatitis.        Medication List    STOP taking these medications   loperamide 2 MG capsule Commonly known as:  IMODIUM     TAKE these medications   Acidophilus Lactobacillus Caps Take 1 capsule by mouth daily.   amiodarone 200 MG tablet Commonly known as:  PACERONE Take 1 tablet (200 mg total) by mouth daily.   calcium carbonate 500 MG chewable tablet Commonly known as:  TUMS - dosed in mg elemental calcium Chew 2 tablets by mouth 2 (two) times daily.   carboxymethylcellulose 0.5 % Soln Commonly known as:  REFRESH PLUS Place 1 drop into both eyes 4 (four) times daily.   cefTRIAXone 2 g in dextrose 5 % 50 mL Inject 2 g into the vein daily.   clindamycin 1 % external solution Commonly known as:  CLEOCIN T Apply 1 application  topically daily as needed (head breakouts).   cyanocobalamin 1000 MCG/ML injection Commonly known as:  (VITAMIN B-12) Inject 1,000 mcg into the muscle every 14 (fourteen) days.   cycloSPORINE 0.05 % ophthalmic emulsion Commonly known as:  RESTASIS Place 1 drop into both eyes 2 (two) times daily.   ELIQUIS 5 MG Tabs tablet Generic drug:  apixaban Take 1 tablet (5 mg total) by mouth 2 (two) times daily.   ferrous sulfate 325 (65 FE) MG tablet Take 325 mg by mouth daily with breakfast.   gabapentin 100 MG capsule Commonly known as:  NEURONTIN Take 100-200 mg by mouth See admin instructions. Take 100 mg by mouth in the morning and take 200 mg by mouth at bedtime   HYDROcodone-acetaminophen 5-325 MG tablet Commonly known as:  NORCO/VICODIN Take 1-2 tablets by mouth every 4 (four) hours as needed for moderate pain or severe pain.   MAGNESIUM-OXIDE 400 (241.3 Mg) MG tablet Generic drug:  magnesium oxide Take 800 mg by mouth 2 (two) times daily.   METAMUCIL 0.52 g capsule Generic drug:  psyllium Take 0.52 g by mouth 2 (two) times daily.   minocycline 100 MG tablet Commonly known as:  DYNACIN Take 100 mg by mouth 2 (two) times daily as needed (face breakout).   multivitamin with minerals Tabs tablet Take 1 tablet by mouth daily.   omeprazole 20 MG capsule Commonly known as:  PRILOSEC Take 20 mg by mouth daily.   ondansetron 4 MG tablet Commonly known as:  ZOFRAN Take 1 tablet (4 mg total) by mouth every 6 (six) hours as needed for nausea.   PARoxetine 20 MG tablet Commonly known as:  PAXIL Take 20 mg by mouth at bedtime.   PRESCRIPTION MEDICATION Inject 1,000 mLs into the vein See admin instructions. Dextrose 5% in normal saline (0.9%) administered at 129m/hr over 6 hours   sodium bicarbonate 650 MG tablet Take 1,300 mg by mouth 2 (two) times daily.   sulfamethoxazole-trimethoprim 400-80 MG tablet Commonly known as:  BACTRIM,SEPTRA Take 1 tablet by mouth every 12  (twelve) hours.   terazosin 2 MG capsule Commonly known as:  HYTRIN Take 2 mg by mouth at bedtime.   traZODone 100 MG tablet Commonly known as:  DESYREL Take 100 mg by mouth at bedtime.   Vitamin D 2000 units Caps Take 2,000 Units by mouth daily.       Disposition: 01-Home or Self Care    Follow-up Information  [name], MD. Schedule an appointment as soon as possible for a visit in 2 week(s).   Specialty:  General Surgery Contact information: [address] [place] [address] [phone]            Signed: [name], M.D., [facility] Surgery Office:  [phone]  08/08/2017, 4:27 PM

## 2017-08-10 DIAGNOSIS — D631 Anemia in chronic kidney disease: Secondary | ICD-10-CM | POA: Diagnosis not present

## 2017-08-10 DIAGNOSIS — R7881 Bacteremia: Secondary | ICD-10-CM | POA: Diagnosis not present

## 2017-08-10 DIAGNOSIS — B9562 Methicillin resistant Staphylococcus aureus infection as the cause of diseases classified elsewhere: Secondary | ICD-10-CM | POA: Diagnosis not present

## 2017-08-10 DIAGNOSIS — I480 Paroxysmal atrial fibrillation: Secondary | ICD-10-CM | POA: Diagnosis not present

## 2017-08-10 DIAGNOSIS — K50912 Crohn's disease, unspecified, with intestinal obstruction: Secondary | ICD-10-CM | POA: Diagnosis not present

## 2017-08-10 DIAGNOSIS — N183 Chronic kidney disease, stage 3 (moderate): Secondary | ICD-10-CM | POA: Diagnosis not present

## 2017-08-11 DIAGNOSIS — D1801 Hemangioma of skin and subcutaneous tissue: Secondary | ICD-10-CM | POA: Diagnosis not present

## 2017-08-11 DIAGNOSIS — L814 Other melanin hyperpigmentation: Secondary | ICD-10-CM | POA: Diagnosis not present

## 2017-08-11 DIAGNOSIS — D229 Melanocytic nevi, unspecified: Secondary | ICD-10-CM | POA: Diagnosis not present

## 2017-08-11 DIAGNOSIS — L821 Other seborrheic keratosis: Secondary | ICD-10-CM | POA: Diagnosis not present

## 2017-08-16 DIAGNOSIS — N189 Chronic kidney disease, unspecified: Secondary | ICD-10-CM | POA: Diagnosis not present

## 2017-08-16 DIAGNOSIS — K5180 Other ulcerative colitis without complications: Secondary | ICD-10-CM | POA: Diagnosis not present

## 2017-08-16 DIAGNOSIS — Z6827 Body mass index (BMI) 27.0-27.9, adult: Secondary | ICD-10-CM | POA: Diagnosis not present

## 2017-08-16 DIAGNOSIS — A419 Sepsis, unspecified organism: Secondary | ICD-10-CM | POA: Diagnosis not present

## 2017-08-16 DIAGNOSIS — Z1389 Encounter for screening for other disorder: Secondary | ICD-10-CM | POA: Diagnosis not present

## 2017-08-17 DIAGNOSIS — K50912 Crohn's disease, unspecified, with intestinal obstruction: Secondary | ICD-10-CM | POA: Diagnosis not present

## 2017-08-17 DIAGNOSIS — R7881 Bacteremia: Secondary | ICD-10-CM | POA: Diagnosis not present

## 2017-08-17 DIAGNOSIS — B9562 Methicillin resistant Staphylococcus aureus infection as the cause of diseases classified elsewhere: Secondary | ICD-10-CM | POA: Diagnosis not present

## 2017-08-17 DIAGNOSIS — D631 Anemia in chronic kidney disease: Secondary | ICD-10-CM | POA: Diagnosis not present

## 2017-08-17 DIAGNOSIS — N183 Chronic kidney disease, stage 3 (moderate): Secondary | ICD-10-CM | POA: Diagnosis not present

## 2017-08-17 DIAGNOSIS — I480 Paroxysmal atrial fibrillation: Secondary | ICD-10-CM | POA: Diagnosis not present

## 2017-08-19 DIAGNOSIS — N183 Chronic kidney disease, stage 3 (moderate): Secondary | ICD-10-CM | POA: Diagnosis not present

## 2017-08-19 DIAGNOSIS — I480 Paroxysmal atrial fibrillation: Secondary | ICD-10-CM | POA: Diagnosis not present

## 2017-08-19 DIAGNOSIS — B9562 Methicillin resistant Staphylococcus aureus infection as the cause of diseases classified elsewhere: Secondary | ICD-10-CM | POA: Diagnosis not present

## 2017-08-19 DIAGNOSIS — R7881 Bacteremia: Secondary | ICD-10-CM | POA: Diagnosis not present

## 2017-08-19 DIAGNOSIS — K50912 Crohn's disease, unspecified, with intestinal obstruction: Secondary | ICD-10-CM | POA: Diagnosis not present

## 2017-08-19 DIAGNOSIS — R7889 Finding of other specified substances, not normally found in blood: Secondary | ICD-10-CM | POA: Diagnosis not present

## 2017-08-19 DIAGNOSIS — D631 Anemia in chronic kidney disease: Secondary | ICD-10-CM | POA: Diagnosis not present

## 2017-08-24 DIAGNOSIS — B9562 Methicillin resistant Staphylococcus aureus infection as the cause of diseases classified elsewhere: Secondary | ICD-10-CM | POA: Diagnosis not present

## 2017-08-24 DIAGNOSIS — N183 Chronic kidney disease, stage 3 (moderate): Secondary | ICD-10-CM | POA: Diagnosis not present

## 2017-08-24 DIAGNOSIS — R7881 Bacteremia: Secondary | ICD-10-CM | POA: Diagnosis not present

## 2017-08-24 DIAGNOSIS — D631 Anemia in chronic kidney disease: Secondary | ICD-10-CM | POA: Diagnosis not present

## 2017-08-24 DIAGNOSIS — K50912 Crohn's disease, unspecified, with intestinal obstruction: Secondary | ICD-10-CM | POA: Diagnosis not present

## 2017-08-24 DIAGNOSIS — I480 Paroxysmal atrial fibrillation: Secondary | ICD-10-CM | POA: Diagnosis not present

## 2017-08-30 ENCOUNTER — Ambulatory Visit: Payer: Medicare Other | Admitting: Infectious Diseases

## 2017-08-30 DIAGNOSIS — K5090 Crohn's disease, unspecified, without complications: Secondary | ICD-10-CM | POA: Diagnosis not present

## 2017-08-30 DIAGNOSIS — K912 Postsurgical malabsorption, not elsewhere classified: Secondary | ICD-10-CM | POA: Diagnosis not present

## 2017-08-30 DIAGNOSIS — K559 Vascular disorder of intestine, unspecified: Secondary | ICD-10-CM | POA: Diagnosis not present

## 2017-08-30 DIAGNOSIS — I129 Hypertensive chronic kidney disease with stage 1 through stage 4 chronic kidney disease, or unspecified chronic kidney disease: Secondary | ICD-10-CM | POA: Diagnosis not present

## 2017-08-30 DIAGNOSIS — M109 Gout, unspecified: Secondary | ICD-10-CM | POA: Diagnosis not present

## 2017-08-30 DIAGNOSIS — K635 Polyp of colon: Secondary | ICD-10-CM | POA: Diagnosis not present

## 2017-08-30 DIAGNOSIS — A439 Nocardiosis, unspecified: Secondary | ICD-10-CM | POA: Diagnosis not present

## 2017-08-30 DIAGNOSIS — N200 Calculus of kidney: Secondary | ICD-10-CM | POA: Diagnosis not present

## 2017-08-30 DIAGNOSIS — N2581 Secondary hyperparathyroidism of renal origin: Secondary | ICD-10-CM | POA: Diagnosis not present

## 2017-08-30 DIAGNOSIS — C189 Malignant neoplasm of colon, unspecified: Secondary | ICD-10-CM | POA: Diagnosis not present

## 2017-08-30 DIAGNOSIS — D631 Anemia in chronic kidney disease: Secondary | ICD-10-CM | POA: Diagnosis not present

## 2017-08-30 DIAGNOSIS — N183 Chronic kidney disease, stage 3 (moderate): Secondary | ICD-10-CM | POA: Diagnosis not present

## 2017-08-31 DIAGNOSIS — D631 Anemia in chronic kidney disease: Secondary | ICD-10-CM | POA: Diagnosis not present

## 2017-08-31 DIAGNOSIS — K50912 Crohn's disease, unspecified, with intestinal obstruction: Secondary | ICD-10-CM | POA: Diagnosis not present

## 2017-08-31 DIAGNOSIS — N183 Chronic kidney disease, stage 3 (moderate): Secondary | ICD-10-CM | POA: Diagnosis not present

## 2017-08-31 DIAGNOSIS — R7881 Bacteremia: Secondary | ICD-10-CM | POA: Diagnosis not present

## 2017-08-31 DIAGNOSIS — B9562 Methicillin resistant Staphylococcus aureus infection as the cause of diseases classified elsewhere: Secondary | ICD-10-CM | POA: Diagnosis not present

## 2017-08-31 DIAGNOSIS — I480 Paroxysmal atrial fibrillation: Secondary | ICD-10-CM | POA: Diagnosis not present

## 2017-09-01 ENCOUNTER — Ambulatory Visit (INDEPENDENT_AMBULATORY_CARE_PROVIDER_SITE_OTHER): Payer: Medicare Other | Admitting: Infectious Diseases

## 2017-09-01 ENCOUNTER — Other Ambulatory Visit: Payer: Self-pay | Admitting: Pharmacist

## 2017-09-01 ENCOUNTER — Encounter: Payer: Self-pay | Admitting: Infectious Diseases

## 2017-09-01 DIAGNOSIS — N183 Chronic kidney disease, stage 3 unspecified: Secondary | ICD-10-CM

## 2017-09-01 DIAGNOSIS — K50119 Crohn's disease of large intestine with unspecified complications: Secondary | ICD-10-CM | POA: Diagnosis not present

## 2017-09-01 DIAGNOSIS — A439 Nocardiosis, unspecified: Secondary | ICD-10-CM | POA: Diagnosis not present

## 2017-09-01 NOTE — Assessment & Plan Note (Signed)
His most recent Cr was 1.77 (stable) My appreciation to Dr Jimmy Footman.

## 2017-09-01 NOTE — Progress Notes (Signed)
   Subjective:    Patient ID: SHAWNMICHAEL PARENTEAU, male    DOB: [birth year], 77 y.o.   MRN: [id number]  HPI 77 y.o.malewith a hx of Chron's disease, transverse colon cancer, colon resection and ileostomy Jan 2018. He has had high output to his ostomy, he has been on 1.5 L of ivf daily. On no DMARDs/anti-TNF agents.  Roughly 1 month ago he was seen in f/u in Dr Deterding's office on September 24. He had chills in the office and had BCx drawn at that time. This grew Nocardia nova (S- ceftriaxone, clarithromycin, linezolid, bactrim). He had repeat cx done on 10-4 that was negative. He was in hospital October 2018 for SBO. He noted at that time that he had positive blood cultures the week prior (Coag neg Staph 1/2). He was d/c home on 10-11. His repeat BCx were negative.  He was called on 10-26 for BCx noting nocardia. He was started on bactrim He returned 06-02-17 for reversal of his ileostomy. His surgery was deferred and he was started on daily ceftriaxone, bactrim was continued po. He had CT chest/abd/pelvis which showed 6 mm pulmonary nodule but no abscess. He had MRI of head which was (-). His PIC (present since last Nov) was removed and he had central ine placed. He was d/c home on 06-03-17 and planned to receive 8 weeks of rx.   He underwent reversal of ostomy 07-23-17.  Wound has healed well.  He has been off IVF for the last 2 weeks. He has been feeling well. No f/c.  He continues on bactrim- plan to end 11-01-17.   Review of Systems  Constitutional: Negative for appetite change, chills, fever and unexpected weight change.  Gastrointestinal: Positive for diarrhea. Negative for constipation.  Genitourinary: Negative for difficulty urinating.  Skin: Negative for rash.  Psychiatric/Behavioral: Negative for sleep disturbance.  Please see HPI. All other systems reviewed and negative.      Objective:   Physical Exam  Constitutional: He appears well-developed and well-nourished.  HENT:    Mouth/Throat: No oropharyngeal exudate.  Eyes: EOM are normal. Pupils are equal, round, and reactive to light.  Neck: Neck supple.  Cardiovascular: Normal rate, regular rhythm and normal heart sounds.  Pulmonary/Chest: Effort normal and breath sounds normal.    Abdominal: Soft. Bowel sounds are normal. There is no tenderness. There is no rebound.  Musculoskeletal: He exhibits no edema.  Lymphadenopathy:    He has no cervical adenopathy.      Assessment & Plan:

## 2017-09-01 NOTE — Assessment & Plan Note (Signed)
He appears to be doing well Will continue bactrim til 11-01-17 Will see him ~ 1 week later and repeat his BCx

## 2017-09-01 NOTE — Assessment & Plan Note (Signed)
He appears to be doing well.  Hopefully PIC out soon Diarrhea at baseline.  Appreciate Dr Pollie Friar f/u.

## 2017-09-07 ENCOUNTER — Encounter (HOSPITAL_COMMUNITY): Payer: Self-pay

## 2017-09-07 ENCOUNTER — Encounter (HOSPITAL_COMMUNITY)
Admission: RE | Admit: 2017-09-07 | Discharge: 2017-09-07 | Disposition: A | Discharge status: Home / Self Care | Payer: Medicare Other | Source: Ambulatory Visit | Attending: Surgery | Admitting: Surgery

## 2017-09-07 DIAGNOSIS — Z432 Encounter for attention to ileostomy: Secondary | ICD-10-CM | POA: Insufficient documentation

## 2017-09-07 LAB — BASIC METABOLIC PANEL
ANION GAP: 10 (ref 5–15)
BUN: 21 mg/dL — ABNORMAL HIGH (ref 6–20)
CALCIUM: 8.9 mg/dL (ref 8.9–10.3)
CO2: 19 mmol/L — ABNORMAL LOW (ref 22–32)
Chloride: 109 mmol/L (ref 101–111)
Creatinine, Ser: 1.91 mg/dL — ABNORMAL HIGH (ref 0.61–1.24)
GFR calc non Af Amer: 33 mL/min — ABNORMAL LOW (ref >60)
GFR, EST AFRICAN AMERICAN: 38 mL/min — AB (ref >60)
GLUCOSE: 76 mg/dL (ref 65–99)
POTASSIUM: 4.5 mmol/L (ref 3.5–5.1)
Sodium: 138 mmol/L (ref 135–145)

## 2017-09-07 MED ORDER — HEPARIN SOD (PORK) LOCK FLUSH 100 UNIT/ML IV SOLN
500.0000 [IU] | Freq: Once | INTRAVENOUS | Status: AC
  Administered 2017-09-07: 500 [IU] via INTRAVENOUS

## 2017-09-07 MED ORDER — SODIUM CHLORIDE 0.9% FLUSH
10.0000 mL | INTRAVENOUS | Status: DC
  Administered 2017-09-07: 10 mL via INTRAVENOUS

## 2017-09-07 NOTE — Progress Notes (Signed)
Wife concerned about the frequency of labs being drawn.  According to her they received a high K+ result on 1/30, drawn by Home Health.  She was concerned that Medicare would not pay if we rechecked today.  Advised her that it is appropriate to redraw labs if they were out of range and would be covered.  BMET was drawn off of PICC, as ordered and sent STAT.  Result was 4.5 and called to wife.  In addition was faxed to [name] and [place], for their records.  Will return next week for dressing change and flush.

## 2017-09-13 DIAGNOSIS — N183 Chronic kidney disease, stage 3 (moderate): Secondary | ICD-10-CM | POA: Diagnosis not present

## 2017-09-14 ENCOUNTER — Encounter (HOSPITAL_COMMUNITY)
Admission: RE | Admit: 2017-09-14 | Discharge: 2017-09-14 | Disposition: A | Discharge status: Home / Self Care | Payer: Medicare Other | Source: Ambulatory Visit | Attending: Surgery | Admitting: Surgery

## 2017-09-14 DIAGNOSIS — Z432 Encounter for attention to ileostomy: Secondary | ICD-10-CM | POA: Diagnosis not present

## 2017-09-14 MED ORDER — HEPARIN SOD (PORK) LOCK FLUSH 100 UNIT/ML IV SOLN
INTRAVENOUS | Status: AC
  Filled 2017-09-14: qty 5

## 2017-09-14 MED ORDER — HEPARIN SOD (PORK) LOCK FLUSH 100 UNIT/ML IV SOLN
500.0000 [IU] | INTRAVENOUS | Status: AC | PRN
  Administered 2017-09-14: 500 [IU]

## 2017-09-14 MED ORDER — HEPARIN SOD (PORK) LOCK FLUSH 100 UNIT/ML IV SOLN: 250.0000 [IU] | INTRAVENOUS | Status: DC | PRN

## 2017-09-14 MED ORDER — SODIUM CHLORIDE 0.9% FLUSH
INTRAVENOUS | Status: AC
  Filled 2017-09-14: qty 20

## 2017-09-14 MED ORDER — SODIUM CHLORIDE 0.9% FLUSH
10.0000 mL | INTRAVENOUS | Status: AC | PRN
  Administered 2017-09-14: 10 mL

## 2017-09-15 ENCOUNTER — Other Ambulatory Visit: Payer: Self-pay | Admitting: Surgery

## 2017-09-21 ENCOUNTER — Encounter (HOSPITAL_COMMUNITY)
Admission: RE | Admit: 2017-09-21 | Discharge: 2017-09-21 | Disposition: A | Discharge status: Home / Self Care | Payer: Medicare Other | Source: Ambulatory Visit | Attending: Surgery | Admitting: Surgery

## 2017-09-21 DIAGNOSIS — Z432 Encounter for attention to ileostomy: Secondary | ICD-10-CM | POA: Diagnosis not present

## 2017-09-21 MED ORDER — HEPARIN SOD (PORK) LOCK FLUSH 100 UNIT/ML IV SOLN
500.0000 [IU] | Freq: Once | INTRAVENOUS | Status: AC
  Administered 2017-09-21: 500 [IU] via INTRAVENOUS

## 2017-09-21 NOTE — Progress Notes (Signed)
Dressing changed to picc.  Tegaderm dressing placed, as previous dressing used was irritating skin.  Flushed with heparin.  Blood return noted.  Will schedule appt for next week.

## 2017-09-26 ENCOUNTER — Encounter (HOSPITAL_COMMUNITY): Payer: Medicare Other

## 2017-09-27 ENCOUNTER — Encounter (HOSPITAL_COMMUNITY)
Admission: RE | Admit: 2017-09-27 | Discharge: 2017-09-27 | Disposition: A | Discharge status: Home / Self Care | Payer: Medicare Other | Source: Ambulatory Visit | Attending: Surgery | Admitting: Surgery

## 2017-09-27 DIAGNOSIS — E663 Overweight: Secondary | ICD-10-CM | POA: Diagnosis not present

## 2017-09-27 DIAGNOSIS — Z432 Encounter for attention to ileostomy: Secondary | ICD-10-CM | POA: Diagnosis not present

## 2017-09-27 DIAGNOSIS — Z6828 Body mass index (BMI) 28.0-28.9, adult: Secondary | ICD-10-CM | POA: Diagnosis not present

## 2017-09-27 DIAGNOSIS — J069 Acute upper respiratory infection, unspecified: Secondary | ICD-10-CM | POA: Diagnosis not present

## 2017-09-27 MED ORDER — HEPARIN SOD (PORK) LOCK FLUSH 100 UNIT/ML IV SOLN
250.0000 [IU] | INTRAVENOUS | Status: AC | PRN
  Administered 2017-09-27: 250 [IU]

## 2017-09-27 MED ORDER — SODIUM CHLORIDE 0.9% FLUSH
10.0000 mL | INTRAVENOUS | Status: AC | PRN
  Administered 2017-09-27: 10 mL

## 2017-09-27 NOTE — Progress Notes (Signed)
Here for PICC line flush and drsg change. PICC line right chest in place. Site without redness or drainage. Area cleansed with chlora-prep x 1. Pt refused to have stat-lock changed. Bio-patch applied. Pt request tegaderm be applied. PICC line site covered with tegaderm per pt request. PICC line flushed with saline and heparin. Tolerated well.

## 2017-09-28 ENCOUNTER — Encounter (HOSPITAL_COMMUNITY): Admission: RE | Admit: 2017-09-28 | Payer: Medicare Other | Source: Ambulatory Visit

## 2017-10-04 ENCOUNTER — Encounter (HOSPITAL_COMMUNITY)
Admission: RE | Admit: 2017-10-04 | Discharge: 2017-10-04 | Disposition: A | Discharge status: Home / Self Care | Payer: Medicare Other | Source: Ambulatory Visit | Attending: Surgery | Admitting: Surgery

## 2017-10-04 DIAGNOSIS — Z432 Encounter for attention to ileostomy: Secondary | ICD-10-CM | POA: Insufficient documentation

## 2017-10-04 MED ORDER — HEPARIN SOD (PORK) LOCK FLUSH 100 UNIT/ML IV SOLN
250.0000 [IU] | Freq: Once | INTRAVENOUS | Status: AC
  Administered 2017-10-04: 250 [IU]

## 2017-10-04 MED ORDER — SODIUM CHLORIDE 0.9% FLUSH
10.0000 mL | Freq: Once | INTRAVENOUS | Status: AC
  Administered 2017-10-04: 10 mL

## 2017-10-04 MED ORDER — SODIUM CHLORIDE 0.9% FLUSH
INTRAVENOUS | Status: AC
  Filled 2017-10-04: qty 10

## 2017-10-04 MED ORDER — HEPARIN SOD (PORK) LOCK FLUSH 100 UNIT/ML IV SOLN
INTRAVENOUS | Status: AC
Start: 2017-10-04 — End: ?
  Filled 2017-10-04: qty 5

## 2017-10-06 ENCOUNTER — Other Ambulatory Visit (HOSPITAL_COMMUNITY): Payer: Self-pay | Admitting: Surgery

## 2017-10-06 DIAGNOSIS — E860 Dehydration: Secondary | ICD-10-CM

## 2017-10-08 ENCOUNTER — Encounter (HOSPITAL_COMMUNITY): Payer: Self-pay | Admitting: Physician Assistant

## 2017-10-08 ENCOUNTER — Ambulatory Visit (HOSPITAL_COMMUNITY)
Admission: RE | Admit: 2017-10-08 | Discharge: 2017-10-08 | Disposition: A | Discharge status: Home / Self Care | Payer: Medicare Other | Source: Ambulatory Visit | Attending: Surgery | Admitting: Surgery

## 2017-10-08 DIAGNOSIS — E860 Dehydration: Secondary | ICD-10-CM

## 2017-10-08 DIAGNOSIS — Z452 Encounter for adjustment and management of vascular access device: Secondary | ICD-10-CM | POA: Insufficient documentation

## 2017-10-08 HISTORY — PX: IR REMOVAL TUN CV CATH W/O FL: IMG2289

## 2017-10-08 MED ORDER — CHLORHEXIDINE GLUCONATE 4 % EX LIQD
CUTANEOUS | Status: AC
  Filled 2017-10-08: qty 15

## 2017-10-08 MED ORDER — LIDOCAINE-EPINEPHRINE (PF) 1 %-1:200000 IJ SOLN
INTRAMUSCULAR | Status: AC
  Filled 2017-10-08: qty 30

## 2017-10-08 NOTE — Procedures (Signed)
PROCEDURE SUMMARY:  Successful removal of tunneled central venous catheter.  Patient tolerated well.  See full dictation in Imaging for details.  Bessie Livingood S Jadamarie Butson PA-C 10/08/2017 11:46 AM

## 2017-10-11 ENCOUNTER — Encounter (HOSPITAL_COMMUNITY)
Admission: RE | Admit: 2017-10-11 | Discharge: 2017-10-11 | Disposition: A | Discharge status: Home / Self Care | Payer: Medicare Other | Source: Ambulatory Visit | Attending: Surgery | Admitting: Surgery

## 2017-10-11 DIAGNOSIS — M109 Gout, unspecified: Secondary | ICD-10-CM | POA: Diagnosis not present

## 2017-10-11 DIAGNOSIS — N189 Chronic kidney disease, unspecified: Secondary | ICD-10-CM | POA: Diagnosis not present

## 2017-10-11 DIAGNOSIS — K635 Polyp of colon: Secondary | ICD-10-CM | POA: Diagnosis not present

## 2017-10-11 DIAGNOSIS — K5090 Crohn's disease, unspecified, without complications: Secondary | ICD-10-CM | POA: Diagnosis not present

## 2017-10-11 DIAGNOSIS — N183 Chronic kidney disease, stage 3 (moderate): Secondary | ICD-10-CM | POA: Diagnosis not present

## 2017-10-11 DIAGNOSIS — N2581 Secondary hyperparathyroidism of renal origin: Secondary | ICD-10-CM | POA: Diagnosis not present

## 2017-10-11 DIAGNOSIS — I129 Hypertensive chronic kidney disease with stage 1 through stage 4 chronic kidney disease, or unspecified chronic kidney disease: Secondary | ICD-10-CM | POA: Diagnosis not present

## 2017-10-11 DIAGNOSIS — K559 Vascular disorder of intestine, unspecified: Secondary | ICD-10-CM | POA: Diagnosis not present

## 2017-10-11 DIAGNOSIS — C189 Malignant neoplasm of colon, unspecified: Secondary | ICD-10-CM | POA: Diagnosis not present

## 2017-10-11 DIAGNOSIS — K912 Postsurgical malabsorption, not elsewhere classified: Secondary | ICD-10-CM | POA: Diagnosis not present

## 2017-10-11 DIAGNOSIS — R82992 Hyperoxaluria: Secondary | ICD-10-CM | POA: Diagnosis not present

## 2017-10-11 DIAGNOSIS — N200 Calculus of kidney: Secondary | ICD-10-CM | POA: Diagnosis not present

## 2017-10-11 DIAGNOSIS — D631 Anemia in chronic kidney disease: Secondary | ICD-10-CM | POA: Diagnosis not present

## 2017-10-15 ENCOUNTER — Encounter: Payer: Self-pay | Admitting: *Deleted

## 2017-10-25 ENCOUNTER — Encounter: Payer: Self-pay | Admitting: Internal Medicine

## 2017-10-25 ENCOUNTER — Ambulatory Visit (INDEPENDENT_AMBULATORY_CARE_PROVIDER_SITE_OTHER): Payer: Medicare Other | Admitting: Internal Medicine

## 2017-10-25 VITALS — BP 112/60 | HR 66 | Ht 72.0 in | Wt 209.2 lb

## 2017-10-25 DIAGNOSIS — I480 Paroxysmal atrial fibrillation: Secondary | ICD-10-CM

## 2017-10-25 DIAGNOSIS — I428 Other cardiomyopathies: Secondary | ICD-10-CM

## 2017-10-25 DIAGNOSIS — I712 Thoracic aortic aneurysm, without rupture, unspecified: Secondary | ICD-10-CM

## 2017-10-25 NOTE — Patient Instructions (Addendum)
Medication Instructions:  Your physician recommends that you continue on your current medications as directed. Please refer to the Current Medication list given to you today.  -- If you need a refill on your cardiac medications before your next appointment, please call your pharmacy. --  Labwork: None ordered  Testing/Procedures: None ordered  Follow-Up:  REFERRAL TO PULMONOLOGY   Your physician wants you to follow-up in: 6 MONTH with Dr. Saunders Revel.    You will receive a reminder letter in the mail two months in advance. If you don't receive a letter, please call our office to schedule the follow-up appointment.  Thank you for choosing [facility]!!    Any Other Special Instructions Will Be Listed Below (If Applicable).

## 2017-10-25 NOTE — Progress Notes (Signed)
Follow-up Outpatient Visit Date: 10/25/2017  Primary Care Provider: Sharilyn Sites, [place] [address]  Chief Complaint: Follow-up paroxysmal atrial fibrillation  HPI:  [name] is a 77 y.o. year-old male with history of aortic root aneurysm followed by Dr. Servando Snare, aortic regurgitation, nonischemic cardiomyopathy, paroxysmal atrial fibrillation, HTN, hyperlipidemia, chronic kidney disease, Crohns' disease, colon cancer s/p colectomy complicated by ischemic colitis in 08/2016, and remote PE, who presents for follow-up of NICM and PAF. I last saw Mr. Yu in September, at which time he reported improved appetite as well as modest weight gain. He continued to receive IVF boluses via a PICC line due to high ostomy output. He underwent successful ileostomy takedown on 12/201/18 by Dr. [name]. He was also found to have nocardia bacteremia this fall followed by Dr. Johnnye Sima (ID). He remains on Bactrim through 11/01/17.  Today, Mr. Aline Brochure reports that he is doing well.  He denies chest pain, shortness of breath, and palpitations.  He is tolerating amiodarone and apixaban well.  Most recent creatinine checked by Dr. Jimmy Footman was slightly above baseline at 2.1.  He is scheduled for repeat labs next month.  Mr. Fanguy continues to have 7-10 bowel movements a day, which has been a chronic problem for him dating back to the 1970s following small bowel resection for Crohn's disease.  He is drinking lots of water in an attempt to stay well-hydrated.  He has not followed up with pulmonology, as his 35-monthfollow-up visit fell during the time that he was hospitalized with his ostomy takedown.  --------------------------------------------------------------------------------------------------  Cardiovascular History & Procedures: Cardiovascular Problems:  TAA  Aortic regurgitation  Non-ischemic cardiomyopathy  Paroxysmal atrial fibrillation  Remote pulmonary  embolism  Risk Factors:  Hypertension, hyperlipidemia, male gender, and age > 566 Cath/PCI:  None  CV Surgery:  None  EP Procedures and Devices:  None  Non-Invasive Evaluation(s):  TTE (11/25/16): Normal LV size with LVEFof 55-60% with normal wall motion. Grade 1 diastolic dysfunction. Moderate aortic root dilation (4.8 cm) with mild AI. Mild left atrial enlargement. Normal RV size and function.  MRA chest (10/29/16): Stable TAA measuring 5.3 cm at the sinuses of Valsalva and 4.4 cm above the sinotubular junction.  TEE (05/29/17): LVEF 45-50%. Mild to moderate AI. Moderately dilated aortic root. Dilated left atrium without thrombus. Dilated right atrium. No PFO.  TTE (05/27/17): Normal LV size with moderate LVH. LVEF 45-50%. Mild AI. Aortic root measures up to 5.1 cm. Mitral annular calcification. Normal RV size and function. Normal PA pressure.  TTE (09/06/15): Normal LV size with focal basal septal hypertrophy. LVEF 55-60% with grade 1 diastolic dysfunction. Mild to moderate AI with aortic root measuring up to 5.0 cm. Mild LA enlargement. Normal RV size and function.  Pharmacologic MPI (09/11/13): Normal apical thinning. No ischemia or scar. LVEF 50%.   Recent CV Pertinent Labs: Lab Results  Component Value Date   CHOL 93 04/19/2015   HDL 31.60 (L) 04/19/2015   LDLDIRECT 28.0 04/19/2015   TRIG 143 09/07/2016   CHOLHDL 3 04/19/2015   INR 1.04 06/02/2017   INR 1.70 (L) 01/18/2006   K 4.5 09/07/2017   K 4.5 11/23/2012   MG 1.3 (L) 05/11/2017   BUN 21 (H) 09/07/2017   BUN 25.3 11/23/2012   CREATININE 1.91 (H) 09/07/2017   CREATININE 1.8 (H) 09/25/2015   CREATININE 1.9 (H) 11/23/2012    Past medical and surgical history were reviewed and updated in EPIC.  Current Meds  Medication Sig  .  Acidophilus Lactobacillus CAPS Take 1 capsule by mouth daily.  Marland Kitchen amiodarone (PACERONE) 200 MG tablet Take 1 tablet (200 mg total) by mouth daily.  . calcium carbonate (TUMS -  DOSED IN MG ELEMENTAL CALCIUM) 500 MG chewable tablet Chew 2 tablets by mouth 2 (two) times daily.  . carboxymethylcellulose (REFRESH PLUS) 0.5 % SOLN Place 1 drop into both eyes 4 (four) times daily.  . cefTRIAXone 2 g in dextrose 5 % 50 mL Inject 2 g into the vein daily.  . Cholecalciferol (VITAMIN D) 2000 units CAPS Take 2,000 Units by mouth daily.  . clindamycin (CLEOCIN T) 1 % external solution Apply 1 application topically daily as needed (head breakouts).  . cyanocobalamin (,VITAMIN B-12,) 1000 MCG/ML injection Inject 1,000 mcg into the muscle every 14 (fourteen) days.  . cycloSPORINE (RESTASIS) 0.05 % ophthalmic emulsion Place 1 drop into both eyes 2 (two) times daily.   Marland Kitchen ELIQUIS 5 MG TABS tablet Take 1 tablet (5 mg total) by mouth 2 (two) times daily.  . ferrous sulfate 325 (65 FE) MG tablet Take 325 mg by mouth daily with breakfast.  . gabapentin (NEURONTIN) 100 MG capsule Take 100-200 mg by mouth See admin instructions. Take 100 mg by mouth in the morning and take 200 mg by mouth at bedtime  . HYDROcodone-acetaminophen (NORCO/VICODIN) 5-325 MG tablet Take 1-2 tablets by mouth every 4 (four) hours as needed for moderate pain or severe pain.  . magnesium oxide (MAGNESIUM-OXIDE) 400 (241.3 Mg) MG tablet Take 800 mg by mouth 2 (two) times daily.   . minocycline (DYNACIN) 100 MG tablet Take 100 mg by mouth 2 (two) times daily as needed (face breakout).  . Multiple Vitamin (MULTIVITAMIN WITH MINERALS) TABS tablet Take 1 tablet by mouth daily.  Marland Kitchen omeprazole (PRILOSEC) 20 MG capsule Take 20 mg by mouth daily.    . ondansetron (ZOFRAN) 4 MG tablet Take 1 tablet (4 mg total) by mouth every 6 (six) hours as needed for nausea.  Marland Kitchen PARoxetine (PAXIL) 20 MG tablet Take 20 mg by mouth at bedtime.  Marland Kitchen PRESCRIPTION MEDICATION Inject 1,000 mLs into the vein See admin instructions. Dextrose 5% in normal saline (0.9%) administered at 161m/hr over 6 hours  . sodium bicarbonate 650 MG tablet Take 3 tablets by  mouth twice a day  . sulfamethoxazole-trimethoprim (BACTRIM,SEPTRA) 400-80 MG tablet Take 1 tablet by mouth every 12 (twelve) hours.  .Marland Kitchenterazosin (HYTRIN) 2 MG capsule Take 2 mg by mouth at bedtime.   . traZODone (DESYREL) 100 MG tablet Take 100 mg by mouth at bedtime.    Allergies: Lorazepam and Humira [adalimumab]  Social History   Socioeconomic History  . Marital status: Married    Spouse name: Not on file  . Number of children: 2  . Years of education: Not on file  . Highest education level: Not on file  Occupational History  . Occupation: Retired  SScientific laboratory technician . Financial resource strain: Not on file  . Food insecurity:    Worry: Not on file    Inability: Not on file  . Transportation needs:    Medical: Not on file    Non-medical: Not on file  Tobacco Use  . Smoking status: Former Smoker    Packs/day: 2.00    Years: 20.00    Pack years: 40.00    Types: Cigarettes    Last attempt to quit: 08/04/1975    Years since quitting: 42.2  . Smokeless tobacco: Former [employer] developer   Types: CLoss adjuster, chartered  Quit date: 08/03/1978  . Tobacco comment: 09/21/2013 "quit smoking in the late 1970's; stopped chewing couple years after I quit smoking"  Substance and Sexual Activity  . Alcohol use: No  . Drug use: No  . Sexual activity: Not Currently  Lifestyle  . Physical activity:    Days per week: Not on file    Minutes per session: Not on file  . Stress: Not on file  Relationships  . Social connections:    Talks on phone: Not on file    Gets together: Not on file    Attends religious service: Not on file    Active member of club or organization: Not on file    Attends meetings of clubs or organizations: Not on file    Relationship status: Not on file  . Intimate partner violence:    Fear of current or ex partner: Not on file    Emotionally abused: Not on file    Physically abused: Not on file    Forced sexual activity: Not on file  Other Topics Concern  . Not on file  Social History  Narrative   Married 2 children and 6 grandchildren all local   [place] [employer]   Daily caffeine   Does not exercise regularly    Family History  Problem Relation Age of Onset  . Kidney disease Father   . Hypertension Father   . Aneurysm Mother   . Aneurysm Sister   . Esophageal cancer Neg Hx   . Stomach cancer Neg Hx   . Rectal cancer Neg Hx     Review of Systems: A 12-system review of systems was performed and was negative except as noted in the HPI.  --------------------------------------------------------------------------------------------------  Physical Exam: BP 112/60   Pulse 66   Ht 6' (1.829 m)   Wt 209 lb 3.2 oz (94.9 kg)   SpO2 96%   BMI 28.37 kg/m   General: NAD. HEENT: No conjunctival pallor or scleral icterus. Moist mucous membranes.  OP clear. Neck: Supple without lymphadenopathy, thyromegaly, JVD, or HJR. No carotid bruit. Lungs: Normal work of breathing. Clear to auscultation bilaterally without wheezes or crackles. Heart: Regular rate and rhythm without murmurs, rubs, or gallops. Non-displaced PMI. Abd: Bowel sounds present. Soft, NT/ND without hepatosplenomegaly.  Abdominal incisions are well-healed. Ext: No lower extremity edema. Radial, PT, and DP pulses are 2+ bilaterally. Skin: Warm and dry without rash.  EKG:  NSR, LAD, borderline LVH, and non-specific ST changes.  Lab Results  Component Value Date   WBC 6.3 07/29/2017   HGB 10.9 (L) 07/29/2017   HCT 32.5 (L) 07/29/2017   MCV 87.4 07/29/2017   PLT 170 07/29/2017    Lab Results  Component Value Date   NA 138 09/07/2017   K 4.5 09/07/2017   CL 109 09/07/2017   CO2 19 (L) 09/07/2017   BUN 21 (H) 09/07/2017   CREATININE 1.91 (H) 09/07/2017   GLUCOSE 76 09/07/2017   ALT 24 07/22/2017    Lab Results  Component Value Date   CHOL 93 04/19/2015   HDL 31.60 (L) 04/19/2015   LDLDIRECT 28.0 04/19/2015   TRIG 143 09/07/2016   CHOLHDL 3 04/19/2015     --------------------------------------------------------------------------------------------------  ASSESSMENT AND PLAN: Paroxysmal atrial fibrillation No symptoms to suggest recurrence, though Mr. Zeck was asymptomatic while in atrial fibrillation with rapid ventricular response.  Given that he required amiodarone to maintain sinus rhythm while in the hospital, I think it is best to continue this indefinitely unless he develops  side effects.  We will have him reestablish with Dr. Chase Caller for follow-up of his abnormal PFTs.  Most recent LFTs in 07/2017 were normal.  I will recheck out to Dr. Jimmy Footman to see if he can draw a TSH with Mr. Shugart next BMP in April.  We will continue his current dose of apixaban.  Nonischemic cardiomyopathy No signs or symptoms of decompensated heart failure.  Most recent echo showed normalization of LV systolic function.  Given chronic kidney disease and low resting heart rate/blood pressure, we will defer adding a beta-blocker and ACE inhibitor/ARB, particularly since EF has normalized.  Thoracic aortic aneurysm No symptoms noted.  I have asked Mr. Reason to contact Dr. Everrett Coombe office to ensure that routine follow-up continues.  Follow-up: Return to clinic in 6 months.  Nelva Bush, MD 10/25/2017 4:47 PM

## 2017-10-26 ENCOUNTER — Encounter: Payer: Self-pay | Admitting: Internal Medicine

## 2017-10-26 DIAGNOSIS — I712 Thoracic aortic aneurysm, without rupture, unspecified: Secondary | ICD-10-CM | POA: Insufficient documentation

## 2017-11-08 ENCOUNTER — Ambulatory Visit (INDEPENDENT_AMBULATORY_CARE_PROVIDER_SITE_OTHER): Payer: Medicare Other | Admitting: Infectious Diseases

## 2017-11-08 ENCOUNTER — Encounter: Payer: Self-pay | Admitting: Infectious Diseases

## 2017-11-08 VITALS — BP 157/82 | HR 65 | Temp 97.4°F | Wt 207.0 lb

## 2017-11-08 DIAGNOSIS — Z0000 Encounter for general adult medical examination without abnormal findings: Secondary | ICD-10-CM

## 2017-11-08 DIAGNOSIS — Z125 Encounter for screening for malignant neoplasm of prostate: Secondary | ICD-10-CM

## 2017-11-08 DIAGNOSIS — R911 Solitary pulmonary nodule: Secondary | ICD-10-CM

## 2017-11-08 DIAGNOSIS — A439 Nocardiosis, unspecified: Secondary | ICD-10-CM

## 2017-11-08 DIAGNOSIS — Z9889 Other specified postprocedural states: Secondary | ICD-10-CM | POA: Diagnosis not present

## 2017-11-08 DIAGNOSIS — I10 Essential (primary) hypertension: Secondary | ICD-10-CM | POA: Diagnosis not present

## 2017-11-08 NOTE — Assessment & Plan Note (Addendum)
He has completed his anbx Will repeat his BCx, spoke with lab and asked them to hold for 2 weeks.  Will call him if any abnormalities.  He is otherwise doing very well and will f/u with surgery, renal, PCP.

## 2017-11-08 NOTE — Assessment & Plan Note (Signed)
States he has not had this problem prior.  I asked him to call his PCP.

## 2017-11-08 NOTE — Progress Notes (Signed)
   Subjective:    Patient ID: Derek [name], male    DOB: July 10, 1941, 77 y.o.   MRN: [id number]  HPI 77 y.o.malewith a hx of Chron's disease, transverse colon cancer, colon resection and ileostomy Jan 2018. He has had high output to his ostomy, he has been on 1.5 L of ivf daily.On no DMARDs/anti-TNF agents. He was seen in f/u in Dr Deterding's office on September 24. He had chills in the office and had BCx drawn at that time. This grew Nocardia nova (S- ceftriaxone, clarithromycin, linezolid, bactrim). He had repeat cx done on 10-4 that was negative. He was in hospital October 2018 for SBO. He noted at that time that he had positive blood cultures the week prior (Coag neg Staph 1/2). He was d/c home on 10-11. His repeat BCx were negative. He was called on 10-26 for BCx noting nocardia. He was started on bactrim He returned 10-31-18for reversal of his ileostomy. His surgery was deferred and he was started on daily ceftriaxone, bactrim was continued po. He had CT chest/abd/pelvis which showed 6 mm pulmonary nodule but no abscess. He had MRI of head which was (-). His PIC (present since last Nov) was removed and he had central ine placed. He was d/c home on 06-03-17 and planned to receive 8 weeks of ceftriaxone.   He underwent reversal of ostomy 07-23-17.  He completed bactrim- 11-01-17.   Bowels have been working well. Wound is well healed.  Has been getting out and enjoying the recent good weather.  No f/c. Eating well, wt is steady.   Review of Systems  Constitutional: Negative for appetite change, chills, fever and unexpected weight change.  Respiratory: Negative for cough and shortness of breath.   Cardiovascular: Negative for chest pain.  Gastrointestinal: Negative for abdominal pain, constipation and diarrhea.  Neurological: Negative for headaches.  Psychiatric/Behavioral: Negative for sleep disturbance.  Please see HPI. All other systems reviewed and negative.       Objective:   Physical Exam  Constitutional: He appears well-developed and well-nourished.  HENT:  Mouth/Throat: No oropharyngeal exudate.  Eyes: EOM are normal. Left eye hordeolum: pupils unequal. long standing problem since 1970s.  Neck: Neck supple.  Cardiovascular: Normal rate, regular rhythm and normal heart sounds.  Pulmonary/Chest: Effort normal and breath sounds normal.  Abdominal: Soft. Bowel sounds are normal. There is no tenderness. There is no rebound.  Musculoskeletal: He exhibits no edema.  Lymphadenopathy:    He has no cervical adenopathy.  Psychiatric: He has a normal mood and affect.          Assessment & Plan:

## 2017-11-08 NOTE — Addendum Note (Signed)
Addended by: Vitaly Wanat C on: 11/08/2017 11:05 AM   Modules accepted: Orders

## 2017-11-09 LAB — PSA: PSA: 0.2 ng/mL (ref <=4.0)

## 2017-11-13 LAB — CULTURE, BLOOD (SINGLE)
MICRO NUMBER:: 90429510
MICRO NUMBER:: 90429512

## 2017-11-30 ENCOUNTER — Ambulatory Visit (INDEPENDENT_AMBULATORY_CARE_PROVIDER_SITE_OTHER): Payer: Medicare Other | Admitting: Internal Medicine

## 2017-11-30 ENCOUNTER — Encounter: Payer: Self-pay | Admitting: Internal Medicine

## 2017-11-30 VITALS — BP 128/82 | HR 71 | Ht 72.0 in | Wt 205.4 lb

## 2017-11-30 DIAGNOSIS — J9811 Atelectasis: Secondary | ICD-10-CM

## 2017-11-30 DIAGNOSIS — Z79899 Other long term (current) drug therapy: Secondary | ICD-10-CM | POA: Diagnosis not present

## 2017-11-30 DIAGNOSIS — R942 Abnormal results of pulmonary function studies: Secondary | ICD-10-CM

## 2017-11-30 NOTE — Progress Notes (Signed)
Subjective:     Patient ID: Derek [name], male   DOB: 1940/09/21, 77 y.o.   MRN: [id number]  HPI  PCP Sharilyn Sites, MD  HPI   IOV 01/20/2017  Chief Complaint  Patient presents with  . abnormal PFT    referring doctor is Dr. Harrell Gave End the patient had an abnormal PFT test    77 year old male with multiple medical problems. He has been on amiodarone for a few years.  Then on 01/05/2017 he had pulmonary function test that showed isolated reduction in diffusion capacity to 60% [I personally visualized the graph  Tracing and agree) . In terms of his recent history he is on amiodarone for paroxysmal atrial fibrillation. He has also remote PE history. He is on Xarelto. In October 2017 is supposed to have a scheduled colectomy but he was found to be on atrial fibrillation and he was cardioverted. April 2018 cardiology visit showed he was in sinus rhythm and his many year "2 amio was reduced to 1 per wife". He does have mildly reduced ejection fraction. In the interim to be in October 2017 in April 2018 the underwent transverse colectomy by Dr. [name]. Course was complicated by breakdown and anastomosis and in March of February 2018 he underwent laparotomy with right colectomy and end ileostomy. Then in April 2018 of March 2018 he had influenza and was hospitalized according to his history. He has since recovered from it. At baseline he is not experiencing any chest pain or shortness of breath or palpitations or cough or wheezing except very rarely. Feels good overall. He does have a PICC line because of his colectomy for he gets hydration.   He also tells me that at baseline he is noticed to have chronic scarring in his lungs from pneumonia suffered 20 years ago. Review of the the radiology images shows that even as early as 2013 he had some bilateral lower lobe atelectasis seen on CT chest. This is seen on CT abdomen lung cut even as of January 2018 and October 2017. In March 2089 on the time  he had a surgery/influenza this is more prominent with some lower lobe consolidation. This no further imaging. According to have a CT chest at the [facility] recently for these images are hard to get.  Walking desaturation test 185 feet 3 laps on room air in the office: did not drop below 97%. Walked fast.   He has refused to have HRCT this visit due to multiple scans this year including a suspected CT chest at [facility] this year   OV 11/30/2017  Chief Complaint  Patient presents with  . Follow-up    Pt states he has been doing good. Denies any complaints.    [name] presents for follow-up. He is on amiodarone therapy with abnormal pulmonary function test in 2018 with isolated reduction in diffusion capacity. He's had multipleabdominal surgeries and history of pneumonia and therefore lower lobe atelectasis that causes some crackles  It has been almost a year since I last saw [name]. He lost to follow-up. He did see Dr. Harrell Gave and in cardiology in March 2019 and was encouraged to report back to pulmonary follow-up. Therefore he is here. He says in the last 1 year his overall health is improved significantly. He is able to do his job. He is quite active. He continues on amiodarone which cardiology wants to continue indefinitely to the extent possible He does not have any cough or shortness of breath or wheezing.  There is no orthopnea paroxysmal nocturnal dyspnea. He most recently had a CT abdomen in November 2018 and also had a CT chest without contrast. I personally visualized this film and confirmed with the lung tissue does indeed look healthy but with some basal atelectasis is mild.  Walking desaturation test on 11/30/2017 185 feet x 3 laps on ROOM AIR:  Walked real fast and did not desaturate. Rest pulse ox was 100%, final pulse ox was 96%. HR response 71/min at rest to 83/min at peak exertion. Patient [name]  Did not Desaturate < 88% . [name] yes  did  Desaturated </= 3% points. [name] did not get tachyardic     has a past medical history of Anemia, Anxiety, Aortic insufficiency, Arthritis, Ascending aortic aneurysm ([place]), Atrial fibrillation ([place]), B12 deficiency, Cataract, CKD (chronic kidney disease) stage 3, GFR 30-59 ml/min (HCC) (08/22/2011), Clotting disorder ([place]), Crohn's disease ([place]), Depression, Enlarged prostate, Enteric hyperoxaluria (02/21/2016), GERD (gastroesophageal reflux disease), Gout, Heart murmur, Hepatitis C (1978), Hiatal hernia, History of blood transfusion (1978; 1990's; ?), History of colon polyps, History of kidney stones, History of MRSA infection (2010), History of pulmonary embolism (2006), History of small bowel obstruction, History of staph infection (1978), Hyperoxaluria, Hypertension, Insomnia, Internal hemorrhoids, LV dysfunction, Nephrolithiasis, Pancreatitis (2010), Pancytopenia, Peripheral neuropathy, Pneumonia, Post-traumatic stress syndrome, PTSD (post-traumatic stress disorder), Pulmonary nodule, RLS (restless legs syndrome), Rosacea conjunctivitis(372.31), Secondary hyperparathyroidism ([place]) (02/21/2016), Sinus bradycardia, Skin cancer, Small bowel obstruction ([place]), and Thrombocytopenia ([place]).   reports that he quit smoking about 42 years ago. His smoking use included cigarettes. He has a 40.00 pack-year smoking history. He quit smokeless tobacco use about 39 years ago. His smokeless tobacco use included chew.  Past Surgical History:  Procedure Laterality Date  . ANKLE SURGERY Right   . APPENDECTOMY  1978  . BOWEL RESECTION  1978 X 2  . CARDIOVERSION N/A 05/29/2016   Procedure: CARDIOVERSION;  Surgeon: Sanda Klein, MD;  Location: [facility];  Service: Cardiovascular;  Laterality: N/A;  . CHOLECYSTECTOMY    . COLON RESECTION N/A 08/14/2016   Procedure: LAPAROSCOPIC RESECTION TRANSVERSE COLON;  Surgeon: [name], MD;  Location: [facility];  Service: General;  Laterality: N/A;  . COLON  SURGERY    . COLONOSCOPY    . ESOPHAGOGASTRODUODENOSCOPY    . EYE SURGERY     cataract surgery bilateral  . FOOT SURGERY Right    "took gout out"  . HEMICOLECTOMY Right   . ILEOCECETOMY  1978   [name] 05/10/2000  (09/21/2013)  . ILEOSTOMY    . ILEOSTOMY CLOSURE N/A 07/23/2017   Procedure: ILEOSTOMY REVERSAL ;  Surgeon: [name], MD;  Location: [facility];  Service: General;  Laterality: N/A;  . INGUINAL HERNIA REPAIR Right   . IR FLUORO GUIDE CV LINE RIGHT  05/07/2017  . IR REMOVAL TUN CV CATH W/O FL  10/08/2017  . IR US GUIDE VASC ACCESS RIGHT  05/07/2017  . KNEE ARTHROSCOPY Left   . LAPAROTOMY N/A 08/27/2016   Procedure: EXPLORATORYLAPAROTOMY, LYSIS OF ADHESIONS, ILEOSTOMY, RIGHT COLECTOMY;  Surgeon: [name], MD;  Location: [facility];  Service: General;  Laterality: N/A;  . LIGAMENT REPAIR Left   . TEE WITHOUT CARDIOVERSION N/A 05/29/2016   Procedure: TRANSESOPHAGEAL ECHOCARDIOGRAM (TEE);  Surgeon: Sanda Klein, MD;  Location: [place];  Service: Cardiovascular;  Laterality: N/A;  . TOTAL SHOULDER ARTHROPLASTY Left 09/21/2013  . TOTAL SHOULDER ARTHROPLASTY Left 09/21/2013   Procedure: LEFT TOTAL SHOULDER ARTHROPLASTY;  Surgeon: Marin Shutter,  MD;  Location: [place];  Service: Orthopedics;  Laterality: Left;    Allergies  Allergen Reactions  . Lorazepam Other (See Comments)    Reaction:  Hallucinations   . Humira [Adalimumab] Other (See Comments)    Pt states that he got pancreatitis.      Immunization History  Administered Date(s) Administered  . Influenza, High Dose Seasonal PF 06/03/2017  . Influenza-Unspecified 06/22/2012, 06/03/2013, 06/04/2015  . Pneumococcal Conjugate-13 02/21/2016  . Pneumococcal-Unspecified 07/04/2010  . Td 01/01/2010    Family History  Problem Relation Age of Onset  . Kidney disease Father   . Hypertension Father   . Aneurysm Mother   . Aneurysm Sister   . Esophageal cancer Neg Hx   . Stomach cancer Neg Hx   . Rectal cancer Neg Hx       Current Outpatient Medications:  .  Acidophilus Lactobacillus CAPS, Take 1 capsule by mouth daily., Disp: 90 capsule, Rfl: 3 .  amiodarone (PACERONE) 200 MG tablet, Take 1 tablet (200 mg total) by mouth daily., Disp: 90 tablet, Rfl: 3 .  calcium carbonate (TUMS - DOSED IN MG ELEMENTAL CALCIUM) 500 MG chewable tablet, Chew 2 tablets by mouth 2 (two) times daily., Disp: , Rfl:  .  Cholecalciferol (VITAMIN D) 2000 units CAPS, Take 2,000 Units by mouth daily., Disp: , Rfl:  .  cyanocobalamin (,VITAMIN B-12,) 1000 MCG/ML injection, Inject 1,000 mcg into the muscle every 14 (fourteen) days., Disp: , Rfl:  .  ELIQUIS 5 MG TABS tablet, Take 1 tablet (5 mg total) by mouth 2 (two) times daily., Disp: 180 tablet, Rfl: 3 .  ferrous sulfate 325 (65 FE) MG tablet, Take 325 mg by mouth daily with breakfast., Disp: , Rfl:  .  gabapentin (NEURONTIN) 100 MG capsule, Take 100-200 mg by mouth See admin instructions. Take 100 mg by mouth in the morning and take 200 mg by mouth at bedtime, Disp: , Rfl:  .  magnesium oxide (MAGNESIUM-OXIDE) 400 (241.3 Mg) MG tablet, Take 800 mg by mouth 2 (two) times daily. , Disp: , Rfl:  .  Multiple Vitamin (MULTIVITAMIN WITH MINERALS) TABS tablet, Take 1 tablet by mouth daily., Disp: , Rfl:  .  omeprazole (PRILOSEC) 20 MG capsule, Take 20 mg by mouth daily.  , Disp: , Rfl:  .  PARoxetine (PAXIL) 20 MG tablet, Take 20 mg by mouth at bedtime., Disp: , Rfl:  .  terazosin (HYTRIN) 2 MG capsule, Take 2 mg by mouth at bedtime. , Disp: , Rfl:  .  traZODone (DESYREL) 100 MG tablet, Take 100 mg by mouth at bedtime., Disp: , Rfl:  .  carboxymethylcellulose (REFRESH PLUS) 0.5 % SOLN, Place 1 drop into both eyes 4 (four) times daily., Disp: , Rfl:  .  clindamycin (CLEOCIN T) 1 % external solution, Apply 1 application topically daily as needed (head breakouts)., Disp: , Rfl:  .  cycloSPORINE (RESTASIS) 0.05 % ophthalmic emulsion, Place 1 drop into both eyes 2 (two) times daily. , Disp: ,  Rfl:  .  minocycline (DYNACIN) 100 MG tablet, Take 100 mg by mouth 2 (two) times daily as needed (face breakout)., Disp: , Rfl:    Review of Systems     Objective:   Physical Exam  Constitutional: He is oriented to person, place, and time. He appears well-developed and well-nourished. No distress.  HENT:  Head: Normocephalic and atraumatic.  Right Ear: External ear normal.  Left Ear: External ear normal.  Mouth/Throat: Oropharynx is clear and moist. No oropharyngeal  exudate.  Eyes: Pupils are equal, round, and reactive to light. Conjunctivae and EOM are normal. Right eye exhibits no discharge. Left eye exhibits no discharge. No scleral icterus.  Neck: Normal range of motion. Neck supple. No JVD present. No tracheal deviation present. No thyromegaly present.  Cardiovascular: Normal rate, regular rhythm and intact distal pulses. Exam reveals no gallop and no friction rub.  No murmur heard. Pulmonary/Chest: Effort normal. No respiratory distress. He has no wheezes. He has rales. He exhibits no tenderness.  Very mild scattered crackles left base greater than right base  Abdominal: Soft. Bowel sounds are normal. He exhibits no distension and no mass. There is no tenderness. There is no rebound and no guarding.  Obese has surgical scar  Musculoskeletal: Normal range of motion. He exhibits no edema or tenderness.  Lymphadenopathy:    He has no cervical adenopathy.  Neurological: He is alert and oriented to person, place, and time. He has normal reflexes. No cranial nerve deficit. Coordination normal.  Skin: Skin is warm and dry. No rash noted. He is not diaphoretic. No erythema. No pallor.  Psychiatric: He has a normal mood and affect. His behavior is normal. Judgment and thought content normal.  Nursing note and vitals reviewed.  Vitals:   11/30/17 0936  BP: 128/82  Pulse: 71  SpO2: 98%  Weight: 205 lb 6.4 oz (93.2 kg)  Height: 6' (1.829 m)    Estimated body mass index is 27.86  kg/m as calculated from the following:   Height as of this encounter: 6' (1.829 m).   Weight as of this encounter: 205 lb 6.4 oz (93.2 kg).     Assessment:       ICD-10-CM   1. On amiodarone therapy Z79.899   2. Abnormal PFT R94.2   3. Bilateral atelectasis J98.11      Plan:       There is no evidence of amiodarone lung toxicity. He has abnormal pulmonary function test with isolated reduction in diffusion capacity. This is related to some basal atelectasis which in turn is due to prior pneumonia and abdominal surgery. He continues to do well. Based on November 2018 CT scan of the chest and today's walking desaturation test we will continue to observe him. It is okay for him to continue his amiodarone. I will see him in 6 months with spirometry and diffusion capacity   Dr. Brand Males, M.D., [facility].C.P Pulmonary and Critical Care Medicine Staff Physician, [place] Director - Interstitial Lung Disease  Program  Pulmonary [place] at [place], [place], [address]  Pager: [phone], If no answer or between  15:00h - 7:00h: call [phone] Telephone: [phone]

## 2017-11-30 NOTE — Addendum Note (Signed)
Addended by: Lorretta Harp on: 11/30/2017 10:05 AM   Modules accepted: Orders

## 2017-11-30 NOTE — Patient Instructions (Addendum)
ICD-10-CM   1. On amiodarone therapy Z79.899   2. Abnormal PFT R94.2   3. Bilateral atelectasis J98.11     You are continuing to do well Ok to continue amiodarone  PLAN In (6 months do Pre-bd spiro and dlco only. No lung volume or bd response. No post-bd spiro  FOLLOWUP Return to see me in 6 months after spirometry/dlco

## 2017-12-17 DIAGNOSIS — K635 Polyp of colon: Secondary | ICD-10-CM | POA: Diagnosis not present

## 2017-12-17 DIAGNOSIS — I4891 Unspecified atrial fibrillation: Secondary | ICD-10-CM | POA: Diagnosis not present

## 2017-12-17 DIAGNOSIS — R82992 Hyperoxaluria: Secondary | ICD-10-CM | POA: Diagnosis not present

## 2017-12-17 DIAGNOSIS — M109 Gout, unspecified: Secondary | ICD-10-CM | POA: Diagnosis not present

## 2017-12-17 DIAGNOSIS — K912 Postsurgical malabsorption, not elsewhere classified: Secondary | ICD-10-CM | POA: Diagnosis not present

## 2017-12-17 DIAGNOSIS — C189 Malignant neoplasm of colon, unspecified: Secondary | ICD-10-CM | POA: Diagnosis not present

## 2017-12-17 DIAGNOSIS — K559 Vascular disorder of intestine, unspecified: Secondary | ICD-10-CM | POA: Diagnosis not present

## 2017-12-17 DIAGNOSIS — N200 Calculus of kidney: Secondary | ICD-10-CM | POA: Diagnosis not present

## 2017-12-17 DIAGNOSIS — N2581 Secondary hyperparathyroidism of renal origin: Secondary | ICD-10-CM | POA: Diagnosis not present

## 2017-12-17 DIAGNOSIS — N183 Chronic kidney disease, stage 3 (moderate): Secondary | ICD-10-CM | POA: Diagnosis not present

## 2017-12-17 DIAGNOSIS — K5090 Crohn's disease, unspecified, without complications: Secondary | ICD-10-CM | POA: Diagnosis not present

## 2017-12-17 DIAGNOSIS — I129 Hypertensive chronic kidney disease with stage 1 through stage 4 chronic kidney disease, or unspecified chronic kidney disease: Secondary | ICD-10-CM | POA: Diagnosis not present

## 2017-12-17 DIAGNOSIS — D631 Anemia in chronic kidney disease: Secondary | ICD-10-CM | POA: Diagnosis not present

## 2017-12-22 LAB — AFB CULTURE, BLOOD
MICRO NUMBER:: 90429505
SPECIMEN QUALITY:: ADEQUATE

## 2018-01-24 DIAGNOSIS — Z0001 Encounter for general adult medical examination with abnormal findings: Secondary | ICD-10-CM | POA: Diagnosis not present

## 2018-01-24 DIAGNOSIS — Z6828 Body mass index (BMI) 28.0-28.9, adult: Secondary | ICD-10-CM | POA: Diagnosis not present

## 2018-01-24 DIAGNOSIS — E781 Pure hyperglyceridemia: Secondary | ICD-10-CM | POA: Diagnosis not present

## 2018-01-24 DIAGNOSIS — B1710 Acute hepatitis C without hepatic coma: Secondary | ICD-10-CM | POA: Diagnosis not present

## 2018-01-24 DIAGNOSIS — Z1389 Encounter for screening for other disorder: Secondary | ICD-10-CM | POA: Diagnosis not present

## 2018-01-24 DIAGNOSIS — D6949 Other primary thrombocytopenia: Secondary | ICD-10-CM | POA: Diagnosis not present

## 2018-01-27 ENCOUNTER — Other Ambulatory Visit (HOSPITAL_COMMUNITY): Payer: Self-pay | Admitting: Family Medicine

## 2018-01-27 ENCOUNTER — Ambulatory Visit (HOSPITAL_COMMUNITY)
Admission: RE | Admit: 2018-01-27 | Discharge: 2018-01-27 | Disposition: A | Discharge status: Home / Self Care | Payer: Medicare Other | Source: Ambulatory Visit | Attending: Family Medicine | Admitting: Family Medicine

## 2018-01-27 DIAGNOSIS — R918 Other nonspecific abnormal finding of lung field: Secondary | ICD-10-CM | POA: Diagnosis not present

## 2018-01-27 DIAGNOSIS — I517 Cardiomegaly: Secondary | ICD-10-CM | POA: Insufficient documentation

## 2018-01-27 DIAGNOSIS — E663 Overweight: Secondary | ICD-10-CM | POA: Diagnosis not present

## 2018-01-27 DIAGNOSIS — Z6828 Body mass index (BMI) 28.0-28.9, adult: Secondary | ICD-10-CM | POA: Diagnosis not present

## 2018-01-27 DIAGNOSIS — R0781 Pleurodynia: Secondary | ICD-10-CM | POA: Diagnosis not present

## 2018-01-27 DIAGNOSIS — R079 Chest pain, unspecified: Secondary | ICD-10-CM | POA: Diagnosis not present

## 2018-01-31 ENCOUNTER — Other Ambulatory Visit: Payer: Self-pay | Admitting: Cardiothoracic Surgery

## 2018-01-31 DIAGNOSIS — I712 Thoracic aortic aneurysm, without rupture, unspecified: Secondary | ICD-10-CM

## 2018-02-02 ENCOUNTER — Other Ambulatory Visit: Payer: Self-pay | Admitting: Cardiothoracic Surgery

## 2018-02-02 ENCOUNTER — Ambulatory Visit (HOSPITAL_COMMUNITY)
Admission: RE | Admit: 2018-02-02 | Discharge: 2018-02-02 | Disposition: A | Discharge status: Home / Self Care | Payer: Medicare Other | Source: Ambulatory Visit | Attending: Cardiothoracic Surgery | Admitting: Cardiothoracic Surgery

## 2018-02-02 DIAGNOSIS — I712 Thoracic aortic aneurysm, without rupture, unspecified: Secondary | ICD-10-CM

## 2018-03-09 ENCOUNTER — Telehealth: Payer: Self-pay

## 2018-03-09 NOTE — Telephone Encounter (Signed)
Lauren with [employer] called requesting Dental Clearance for Mr. Lamons to have dental extractions.  Also, requesting the need to stop medications prior to his appointment as well.  I advised her that Mr. Capozzi is being followed for an thoracic aneurysm and has not had surgery with [place].  I advised her to contact the patient's Cardiologist office, Dr. Jeralyn Ruths office to get dental clearance.  She acknowledged receipt.

## 2018-03-10 ENCOUNTER — Other Ambulatory Visit: Payer: Self-pay

## 2018-03-10 ENCOUNTER — Ambulatory Visit (INDEPENDENT_AMBULATORY_CARE_PROVIDER_SITE_OTHER): Payer: Medicare Other | Admitting: Cardiothoracic Surgery

## 2018-03-10 ENCOUNTER — Encounter: Payer: Self-pay | Admitting: Cardiothoracic Surgery

## 2018-03-10 VITALS — BP 138/80 | HR 70 | Resp 18 | Ht 72.0 in | Wt 206.0 lb

## 2018-03-10 DIAGNOSIS — I712 Thoracic aortic aneurysm, without rupture, unspecified: Secondary | ICD-10-CM

## 2018-03-10 DIAGNOSIS — I7121 Aneurysm of the ascending aorta, without rupture: Secondary | ICD-10-CM

## 2018-03-10 NOTE — Progress Notes (Signed)
[address]       [place] [address]             [phone]                    Derek [name] [facility] Medical Record #[id number] Date of Birth: Jun 08, 1941  Referring: Larey Dresser, MD Primary Care: Sharilyn Sites, MD Renal : Dr Santiago Bur: Supple Chief Complaint:    Dilated aorta    History of Present Illness:     Derek [name] 77 y.o. male is seen in the office  today for follow up scan of dilated ascending aorta and mild AI. He has no history of MI, CAD, denies any  anginal symptoms currently. The patient has been followed by cardiology since 2006 with serial chest imaging with known dilated ascending aorta. First noted when he had reaction to [facility] and allopurinol in 2006 and ct of chest was done to ro PE. He has a distant history of pulmonary emboli and was treated with coumadin.  At the mid acendinding  aorta measurements  2006   4.1 cm 2010   4.1 cm 2013   4.4 cm 2015   4.5 cm  At the greatest dimension at the sinus of valsalva is 5.0-5.1 cm  And patient referred to cardiac surgery  Past medical history is significant for Crohn's Disease, intermittent bowel obstructions, episodic pancreatitis , gout, stage 3a chronic renal disease. It's been several years since he's had any flareup of his Crohn's disease.   Patient has family history of his sister  who had a brain aneurysm and mother abdominal aneurysm  history of a laparoscopic resection of the transverse colon on 08/14/16 as well as atrial fibrillation .  Since last seen the patient is continued to improve after numerous abdominal surgical procedures.  His PICC line that he was receiving fluids nutrition has been removed.    Current Activity/ Functional Status:  Patient is independent with mobility/ambulation, transfers, ADL's, IADL's.   Zubrod Score: At the time of surgery this patient's most appropriate activity status/level should be described as: [x]     0    Normal  activity, no symptoms []     1    Restricted in physical strenuous activity but ambulatory, able to do out light work []     2    Ambulatory and capable of self care, unable to do work activities, up and about               >50 % of waking hours                              []     3    Only limited self care, in bed greater than 50% of waking hours []     4    Completely disabled, no self care, confined to bed or chair []     5    Moribund   Past Medical History:  Diagnosis Date  . Anemia   . Anxiety   . Aortic insufficiency    a. mild-mod by echo 09/2015.  . Arthritis    "knees; left shoulder" (09/21/2013)  . Ascending aortic aneurysm (HCC)    a. last measurement 5.3 cm 03/2016 -> f/u planned 09/2015 to continue to follow.  . Atrial fibrillation ([place])   . B12 deficiency    takes Vit 12 shot every 14days   .  Cataract   . CKD (chronic kidney disease) stage 3, GFR 30-59 ml/min (HCC) 08/22/2011  . Clotting disorder ([place])   . Crohn's disease ([place])   . Depression   . Enlarged prostate   . Enteric hyperoxaluria 02/21/2016  . GERD (gastroesophageal reflux disease)    takes Omeprazole daily  . Gout    takes Uloric and Colchicine daily  . Heart murmur   . Hepatitis C 1978   negtive RNA load - spontaneously cleared  . Hiatal hernia   . History of blood transfusion 1978; 1990's; ?   "w/bowel resection; S/P allupurinol; ?" (09/21/2013)  . History of colon polyps   . History of kidney stones   . History of MRSA infection 2010  . History of pulmonary embolism 2006   both legs and both lungs /notes 08/26/2008 (09/21/2013)  . History of small bowel obstruction   . History of staph infection 1978  . Hyperoxaluria    Intestinal  . Hypertension   . Insomnia    takes Trazodone nightly  . Internal hemorrhoids   . LV dysfunction    a. h/o EF 45-50% in 2015, normalized on subsequent echoes.  . Nephrolithiasis   . Pancreatitis 2010   elevated lipase and amylase, stranding in tail of pancreas, ? from  Humira  . Pancytopenia    Hx of  . Peripheral neuropathy    takes Gabapentin daily  . Pneumonia    hx of   . Post-traumatic stress syndrome    takes Paxil nightly  . PTSD (post-traumatic stress disorder)   . Pulmonary nodule    a. 43m by CT 05/2015, recommended f/u 6-12 months.  . RLS (restless legs syndrome)   . Rosacea conjunctivitis(372.31)    takes Minocin daily  . Secondary hyperparathyroidism ([place] 02/21/2016  . Sinus bradycardia   . Skin cancer    "cut/burned off left ear and face" (09/21/2013)  . Small bowel obstruction ([place]   . Thrombocytopenia ([place]    hx of    Past Surgical History:  Procedure Laterality Date  . ANKLE SURGERY Right   . APPENDECTOMY  1978  . BOWEL RESECTION  1978 X 2  . CARDIOVERSION N/A 05/29/2016   Procedure: CARDIOVERSION;  Surgeon: MSanda Klein MD;  [facility];  Service: Cardiovascular;  Laterality: N/A;  . CHOLECYSTECTOMY    . COLON RESECTION N/A 08/14/2016   Procedure: LAPAROSCOPIC RESECTION TRANSVERSE COLON;  Surgeon: DAlphonsa Overall MD;  Location: [facility];  Service: General;  Laterality: N/A;  . COLON SURGERY    . COLONOSCOPY    . ESOPHAGOGASTRODUODENOSCOPY    . EYE SURGERY     cataract surgery bilateral  . FOOT SURGERY Right    "took gout out"  . HEMICOLECTOMY Right   . ILEOCECETOMY  1978   /[name]10/03/2000  (09/21/2013)  . ILEOSTOMY    . ILEOSTOMY CLOSURE N/A 07/23/2017   Procedure: ILEOSTOMY REVERSAL ;  Surgeon: NAlphonsa Overall MD;  Location: [facility];  Service: General;  Laterality: N/A;  . INGUINAL HERNIA REPAIR Right   . IR FLUORO GUIDE CV LINE RIGHT  05/07/2017  . IR REMOVAL TUN CV CATH W/O FL  10/08/2017  . IR [place] VASC ACCESS RIGHT  05/07/2017  . KNEE ARTHROSCOPY Left   . LAPAROTOMY N/A 08/27/2016   Procedure: EXPLORATORYLAPAROTOMY, LYSIS OF ADHESIONS, ILEOSTOMY, RIGHT COLECTOMY;  Surgeon: DAlphonsa Overall MD;  Location: [facility];  Service: General;  Laterality: N/A;  . LIGAMENT REPAIR Left   . TEE WITHOUT  CARDIOVERSION N/A  05/29/2016   Procedure: TRANSESOPHAGEAL ECHOCARDIOGRAM (TEE);  Surgeon: Sanda Klein, MD;  Location: [facility];  Service: Cardiovascular;  Laterality: N/A;  . TOTAL SHOULDER ARTHROPLASTY Left 09/21/2013  . TOTAL SHOULDER ARTHROPLASTY Left 09/21/2013   Procedure: LEFT TOTAL SHOULDER ARTHROPLASTY;  Surgeon: Marin Shutter, MD;  Location: [facility];  Service: Orthopedics;  Laterality: Left;    Family History  Problem Relation Age of Onset  . Kidney disease Father   . Hypertension Father   . Aneurysm Mother   . Aneurysm Sister   . Esophageal cancer Neg Hx   . Stomach cancer Neg Hx   . Rectal cancer Neg Hx   No family history of Aortic dissection, sister died age 59 with brain aneurysm, mother died 51 with ruptured abdominal aneurysm, father died 55 "bad heart" and gout  Social History   Socioeconomic History  . Marital status: Married    Spouse name: Not on file  . Number of children: 2  . Years of education: Not on file  . Highest education level: Not on file  Occupational History  . Occupation: Retired  Scientific laboratory technician  . Financial resource strain: Not on file  . Food insecurity:    Worry: Not on file    Inability: Not on file  . Transportation needs:    Medical: Not on file    Non-medical: Not on file  Tobacco Use  . Smoking status: Former Smoker    Packs/day: 2.00    Years: 20.00    Pack years: 40.00    Types: Cigarettes    Last attempt to quit: 08/04/1975    Years since quitting: 42.6  . Smokeless tobacco: Former Systems developer    Types: Chew    Quit date: 08/03/1978  . Tobacco comment: 09/21/2013 "quit smoking in the late 1970's; stopped chewing couple years after I quit smoking"  Substance and Sexual Activity  . Alcohol use: No  . Drug use: No  . Sexual activity: Not Currently  Social History Narrative   Married 2 children and 6 grandchildren all local   [employer]   Daily caffeine   Does not exercise regularly    Social History   Tobacco Use    Smoking Status Former Smoker  . Packs/day: 2.00  . Years: 20.00  . Pack years: 40.00  . Types: Cigarettes  . Last attempt to quit: 08/04/1975  . Years since quitting: 42.6  Smokeless Tobacco Former Systems developer  . Types: Chew  . Quit date: 08/03/1978  Tobacco Comment   09/21/2013 "quit smoking in the late 1970's; stopped chewing couple years after I quit smoking"    Social History   Substance and Sexual Activity  Alcohol Use No     Allergies  Allergen Reactions  . Lorazepam Other (See Comments)    Reaction:  Hallucinations   . Humira [Adalimumab] Other (See Comments)    Pt states that he got pancreatitis.      Current Outpatient Medications  Medication Sig Dispense Refill  . Acidophilus Lactobacillus CAPS Take 1 capsule by mouth daily. 90 capsule 3  . amiodarone (PACERONE) 200 MG tablet Take 1 tablet (200 mg total) by mouth daily. 90 tablet 3  . calcium carbonate (TUMS - DOSED IN MG ELEMENTAL CALCIUM) 500 MG chewable tablet Chew 2 tablets by mouth 2 (two) times daily.    . carboxymethylcellulose (REFRESH PLUS) 0.5 % SOLN Place 1 drop into both eyes 4 (four) times daily.    . Cholecalciferol (VITAMIN D) 2000 units CAPS Take 2,000  Units by mouth daily.    . clindamycin (CLEOCIN T) 1 % external solution Apply 1 application topically daily as needed (head breakouts).    . cyanocobalamin (,VITAMIN B-12,) 1000 MCG/ML injection Inject 1,000 mcg into the muscle every 14 (fourteen) days.    . cycloSPORINE (RESTASIS) 0.05 % ophthalmic emulsion Place 1 drop into both eyes 2 (two) times daily.     Marland Kitchen ELIQUIS 5 MG TABS tablet Take 1 tablet (5 mg total) by mouth 2 (two) times daily. 180 tablet 3  . ferrous sulfate 325 (65 FE) MG tablet Take 325 mg by mouth daily with breakfast.    . gabapentin (NEURONTIN) 100 MG capsule Take 100-200 mg by mouth See admin instructions. Take 100 mg by mouth in the morning and take 200 mg by mouth at bedtime    . magnesium oxide (MAGNESIUM-OXIDE) 400 (241.3 Mg) MG tablet  Take 800 mg by mouth 2 (two) times daily.     . minocycline (DYNACIN) 100 MG tablet Take 100 mg by mouth 2 (two) times daily as needed (face breakout).    . Multiple Vitamin (MULTIVITAMIN WITH MINERALS) TABS tablet Take 1 tablet by mouth daily.    Marland Kitchen omeprazole (PRILOSEC) 20 MG capsule Take 20 mg by mouth daily.      Marland Kitchen PARoxetine (PAXIL) 20 MG tablet Take 20 mg by mouth at bedtime.    . traZODone (DESYREL) 100 MG tablet Take 100 mg by mouth at bedtime.     No current facility-administered medications for this visit.      Review of Systems:     Cardiac Review of Systems: Y or N  Chest Pain [  N  ]  Resting SOB [ N ] Exertional SOB  [Y  Orthopnea Aqua.Slicker ]   Pedal Edema [ N ]    Palpitations [ N ] Syncope  [N]   Presyncope Aqua.Slicker ]  General Review of Systems: [Y] = yes [  ]=no Constitional: recent weight change [ ] ;  Wt loss over the last 3 months [ ]  anorexia [  ]; fatigue [  ]; nausea [  ]; night sweats [ ] ; fever [  ]; or chills [  ];          Dental: poor dentition[  ]; Last Dentist visit:   Eye : blurred vision [  ]; diplopia [   ]; vision changes [  ];  Amaurosis fugax[  ]; Resp: cough [this week  ];  wheezing[ y ];  hemoptysis[  ]; shortness of breath[  ]; paroxysmal nocturnal dyspnea[  ]; dyspnea on exertion[  ]; or orthopnea[  ];  GI:  gallstones[  ], vomiting[  ];  dysphagia[  ]; melena[  ];  hematochezia [  ]; heartburn[  ];   Hx of  Colonoscopy[ yes ]; GU: kidney stones [  ]; hematuria[  ];   dysuria [  ];  nocturia[  ];  history of     obstruction [  ]; urinary frequency [  ]             Skin: rash, swelling[  ];, hair loss[  ];  peripheral edema[  ];  or itching[  ]; Musculosketetal: myalgias[  ];  joint swelling[  ];  joint erythema[  ];  joint pain[  ];  back pain[  ];  Heme/Lymph: bruising[  ];  bleeding[  ];  anemia[  ];  Neuro: TIA[  ];  headaches[  ];  stroke[  ];  vertigo[  ];  seizures[  ];   paresthesias[  ];  difficulty walking[  ];  Psych:depression[y  ]; anxiety[ y ];hx PTSD  service connected  [place] [phone]  Endocrine: diabetes[  ];  thyroid dysfunction[  ];  Immunizations: Flu up to date [place]  ]; Pneumococcal up to date [  n];  Other:  Physical Exam: BP 138/80 (BP Location: Right Arm, Patient Position: Sitting, Cuff Size: Normal)   Pulse 70   Resp 18   Ht 6' (1.829 m)   Wt 206 lb (93.4 kg)   SpO2 96% Comment: ra  BMI 27.94 kg/m   PHYSICAL EXAMINATION: General appearance: alert, cooperative, appears stated age and no distress Head: Normocephalic, without obvious abnormality, atraumatic Neck: no adenopathy, no carotid bruit, no JVD, supple, symmetrical, trachea midline and thyroid not enlarged, symmetric, no tenderness/mass/nodules Lymph nodes: Cervical, supraclavicular, and axillary nodes normal. Resp: clear to auscultation bilaterally Back: symmetric, no curvature. ROM normal. No CVA tenderness. Cardio: regular rate and rhythm, S1, S2 normal, no murmur, click, rub or gallop GI: soft, non-tender; bowel sounds normal; no masses,  no organomegaly Extremities: extremities normal, atraumatic, no cyanosis or edema and Homans sign is negative, no sign of DVT Neurologic: Grossly normal  Diagnostic Studies & Laboratory data:     Recent Radiology Findings: CLINICAL DATA:  77 year old male with a history of thoracic aneurysm  EXAM: MRA CHEST WITH OR WITHOUT CONTRAST  TECHNIQUE: Angiographic images of the chest were obtained using MRA technique without intravenous contrast.  CONTRAST:  None  COMPARISON:  06/03/2017, 01/28/2017, MR 10/29/2016  FINDINGS: VASCULAR  Aorta: Thoracic aorta unchanged configuration compared to the prior studies. The greatest diameter of ascending aorta again measures approximately 4.4 cm. No dissection flap. No significant calcified atherosclerotic changes.  Heart: Heart size unchanged.  No pericardial fluid/thickening.  Pulmonary Arteries: Main pulmonary artery is not enlarged. Flow signal maintained within  the proximal pulmonary arteries.  Other: No dissection flap of the descending thoracic aorta. No. Aortic fluid.  NON-VASCULAR  Chest wall unremarkable.  No mediastinal adenopathy.  Lungs grossly unremarkable.  Unremarkable appearance of the visualized musculoskeletal elements.  Unremarkable appearance of the upper abdomen.  IMPRESSION: Unchanged appearance of the thoracic aorta, with the greatest diameter of the ascending aorta measuring approximately 4.4 cm.  Signed,  Dulcy Fanny. Dellia Nims, RPVI  [facility]  [facility]   Electronically Signed   By: Corrie Mckusick D.O.   On: 02/02/2018 13:22  Mr Angiogram Chest W Wo Contrast  Result Date: 10/29/2016 CLINICAL DATA:  Aortic aneurysm EXAM: MRA CHEST WITH CONTRAST TECHNIQUE: Multiplanar, multiecho pulse sequences of the chest were obtained with intravenous contrast. Angiographic images of chest were obtained using MRA technique with intravenous contrast. CONTRAST:  9 cc MultiHance COMPARISON:  03/11/2016 FINDINGS: Maximal diameter of the ascending aorta at the sinus of all saw above, sino-tubular junction, and ascending aorta are 5.3 cm, 4.4 cm, and 4.4 cm, respectively. This compares with 5.3 cm, 4.4 cm, and 4.3 cm on the prior study respectively. These measurements are not significantly changed. There is no evidence of intramural hematoma or dissection. Great vessels are patent within the confines of the exam. Vertebral arteries are also grossly patent. No obvious pulmonary thromboembolism. No obvious mediastinal mass effect. There is subsegmental atelectasis towards the lung bases. Visualized abdominal organs are unremarkable. IMPRESSION: Stable aneurysmal dilatation of the ascending aorta at 4.4 cm. Recommend annual imaging followup by CTA or MRA. This recommendation follows 2010 ACCF/AHA/AATS/ACR/ASA/SCA/SCAI/SIR/STS/SVM Guidelines for the Diagnosis and Management  of  Patients with Thoracic Aortic Disease. Circulation. 2010; 121: [id number] Electronically Signed   By: Marybelle Killings M.D.   On: 10/29/2016 16:58    Ct Abdomen Pelvis W Contrast  Result Date: 10/15/2016 CLINICAL DATA:  77 year old male with fever. An status post laparoscopic resection of the transverse colon on 08/14/2016. Burning sensation at the ostomy site. EXAM: CT ABDOMEN AND PELVIS WITH CONTRAST TECHNIQUE: Multidetector CT imaging of the abdomen and pelvis was performed using the standard protocol following bolus administration of intravenous contrast. CONTRAST:  80m ISOVUE-300 IOPAMIDOL (ISOVUE-300) INJECTION 61% COMPARISON:  Abdominal CT dated 09/03/2016 FINDINGS: Lower chest: Bibasilar linear atelectasis/ scarring. The visualized lung bases are otherwise clear. Partially visualized central venous line in the right atrium. No intra-abdominal free air. There is mild diffuse mesenteric and omental edema, improved compared to the prior CT. No free fluid. Hepatobiliary: Cholecystectomy. Mild intrahepatic biliary ductal dilatation versus mild periportal edema. The liver is unremarkable. Pancreas: Unremarkable. No pancreatic ductal dilatation or surrounding inflammatory changes. Spleen: Splenomegaly measuring up to 17 cm in length similar to prior exam. Adrenals/Urinary Tract: The adrenal glands are unremarkable. Bilateral renal cortical atrophy and irregularity. Small bilateral nonobstructing renal calculi measure up to 4 mm in the interpolar aspect of the right kidney. There is no hydronephrosis on either side. Bilateral renal hypodense lesions measure up to 19 mm along the medial aspect of the inferior pole of the right kidney. The larger lesions represent cysts and the smaller lesions are too small to characterize. There is no hydronephrosis on either side. There is symmetric uptake and excretion of contrast by kidneys bilaterally. The visualized ureters and urinary bladder appear unremarkable.  Stomach/Bowel: There is postsurgical changes of right hemicolectomy with a right lower quadrant ileostomy. Oral contrast opacifies the stomach and multiple loops of small bowel and traversing into the ileostomy. There is no evidence of bowel obstruction. No active inflammatory changes of the small bowel. Contrast load distal colon and rectum presumably from prior study or related to rectal administration. Mild thickened appearance of the colonic mucosa similar to prior CT. There is diffuse omental edema with overall interval improvement compared to the prior CT. No drainable fluid collection or abscess identified. Vascular/Lymphatic: There is moderate aortoiliac atherosclerotic disease. There is no aneurysmal dilatation or evidence of dissection. The origins of the mesenteric vasculature appear patent. The SMV, splenic vein, and main portal vein are patent. No portal venous gas identified. There is no adenopathy. Reproductive: The prostate and seminal vesicles appear unremarkable. Other: Postoperative changes of the anterior abdominal wall with a midline vertical anterior abdominal wall incisional scar. Abutment of loops of small bowel to the anterior peritoneal wall in the midline may represent adhesions. Musculoskeletal: Mild degenerative changes of the spine. No acute fracture. L2 hemangioma. IMPRESSION: 1. Postoperative changes of right hemicolectomy. Interval resolution of the previously seen pneumoperitoneum and ascites. Residual inflammatory changes of the omental with interval improvement compared to prior study. No drainable fluid collection or abscess. 2. No evidence of bowel obstruction. Contrast traverses into the ostomy. 3. Abutment of loops of small bowel to the anterior peritoneal wall in the midline likely representing a degree of adhesion. 4. Splenomegaly. 5. Mild-to-moderate renal parenchyma atrophy and cortical irregularity. Small nonobstructing bilateral renal calculi as well as probable  bilateral small renal cysts. No hydronephrosis. Electronically Signed   By: AAnner CreteM.D.   On: 10/15/2016 02:43   Mr Angiogram Chest W Wo Contrast  Result Date: 03/11/2016 CLINICAL DATA:  77year old male with a  history of ascending thoracic aortic aneurysm. EXAM: MRA CHEST WITH OR W  ITHOUT CONTRAST TECHNIQUE: Angiographic images of the chest were obtained using MRA technique without and with intravenous contrast. CONTRAST:  61m MULTIHANCE GADOBENATE DIMEGLUMINE 529 MG/ML IV SOLN COMPARISON:  Most recent prior MRA chest 09/27/2015 FINDINGS: VASCULAR Unchanged dilatation of the aortic root measuring up to 5.3 cm at the sinuses of Valsalva (best measured on the sagittally reformatted images). This is unchanged compared to my measurement on the prior study. Similarly, the aortic root measures approximately 5.1 cm on the axial images although this is likely an underestimation. Aneurysmal dilatation of the tubular portion of the ascending thoracic aorta is also insignificantly changed at 4.3 cm. No effacement of the sino-tubular junction. No evidence of arch or descending aortic dilatation or aneurysm. No dissection. Conventional 3 vessel arch anatomy. The heart is normal in size. No pericardial effusion. The main and central pulmonary arteries are normal in size. No central pulmonary embolus. NON VASCULAR No focal signal abnormality or abnormal enhancement in the mediastinum, lungs, visualized upper abdomen or musculoskeletal structures. There is metallic artifact in the region of the left glenohumeral joint secondary to a left shoulder arthroplasty prosthesis. IMPRESSION: 1. Stable dilatation of the aortic root measuring up to 5.3 cm at the sinuses of Valsalva (best measured on the sagittally reformatted images). On the axial images, the aortic root measures 5.1 cm which is also unchanged compared to prior imaging. 2. Stable fusiform aneurysmal dilatation of the tubular portion of the ascending thoracic  aorta at 4.3 cm. Signed, HCriselda Peaches MD [facility] Electronically Signed   By: HJacqulynn CadetM.D.   On: 03/11/2016 10:53   Mr Angiogram Chest W Wo Contrast  09/27/2015  CLINICAL DATA:  F/U for ascending aortic aneurysm. No symtoms. Hx of skin cancer. EXAM: MRA CHEST WITH OR WITHOUT CONTRAST TECHNIQUE: Angiographic images of the chest were obtained using MRA technique without and with intravenous contrast. CONTRAST:  131mMULTIHANCE GADOBENATE DIMEGLUMINE 529 MG/ML IV SOLN (Half dose OK'd per Dr. KrMaryland Pinkue to GFR of 37). COMPARISON:  None. FINDINGS: Thoracic aorta shows no significant atheromatous irregularity, dissection, or stenosis. Classic 3 vessel brachiocephalic arterial origin anatomy without proximal stenosis. Maximum transverse diameters as follows: 5.1 cm sinuses of Valsalva (previously 5.1 cm) 4.1 cm sino-tubular junction 4.1 cm mid ascending 3.9 cm distal ascending/ proximal arch 3.4 cm distal arch 3.5 cm proximal descending 3 x 2.6 cm distal descending above the diaphragm Central pulmonary arteries unremarkable. No axillary, anterior endplate spurring in the mid thoracic spine. Limited visualized portions of the upper abdomen grossly unremarkable. Mediastinal or hilar adenopathy. No pleural or pericardial effusion. IMPRESSION: 1. Stable aortic root dilatation, 5.1 cm sinuses of Valsalva, without complicating features. Electronically Signed   By: D Lucrezia Europe.D.   On: 09/27/2015 13:57   Mr Angiogram Chest W Wo Contrast  03/22/2014   CLINICAL DATA:  SIZE OF THORACIC AORTA (THORACIC ANEURYSM)  EXAM: MRA CHEST WITH OR WITHOUT CONTRAST  TECHNIQUE: Angiographic images of the chest were obtained using MRA technique without and with intravenous contrast.  BUN and creatinine [facility]  [address]  Results:  BUN 12 mg/dL,  Creatinine 1.6 mg/dL.  CONTRAST:  41m541mULTIHANCE GADOBENATE  DIMEGLUMINE 529 MG/ML IV SOLN  COMPARISON:  08/28/2013 and earlier studies  FINDINGS: The thoracic aorta is dilated to a diameter of 5.1 cm at the level of the sinuses  of Valsalva (stable by my measurement), 4.4 cm at the sino-tubular junction, 4.2 cm distal ascending/ proximal arch, 3.5 cm distal arch/proximal descending, 2.7 cm distal descending above the diaphragm. No evidence of dissection or stenosis. Classic 3 vessel brachiocephalic arterial origin anatomy without proximal stenosis. No significant atheromatous irregularity. No pleural or pericardial effusion. No hilar or mediastinal adenopathy. Visualized portions of upper abdomen and proximal abdominal aorta unremarkable. Usual degenerative spurring at multiple contiguous levels in the mid thoracic spine.  IMPRESSION: 1. Stable 5.1 cm ascending aortic aneurysm without complicating features.   Electronically Signed   By: Arne Cleveland M.D.   On: 03/22/2014 10:51    Mr Angiogram Chest W Wo Contrast  08/28/2013   CLINICAL DATA:  Aortic aneurysm  EXAM: MRA CHEST WITH OR WITHOUT CONTRAST  TECHNIQUE: Angiographic images of the chest were obtained using MRA technique without and with intravenous contrast. Gated sagittal oblique images through the aortic arch were obtained.  CONTRAST:  63m MULTIHANCE GADOBENATE DIMEGLUMINE 529 MG/ML IV SOLN  COMPARISON:  09/05/2012 and earlier studies  FINDINGS: The thoracic aorta is dilated to a diameter of 5.1 cm at the level the sinuses of Valsalva (stable by my measurement), 4.5 cm at the sino-tubular junction, 4.2 cm mid ascending, 3.6 cm proximal arch, 3.2 cm distal arch, 3.0 cm proximal descending, 2.6 cm distal descending above the diaphragm. No evidence of dissection or stenosis. Classic 3 vessel brachiocephalic arterial origin anatomy without proximal stenosis. No significant atheromatous irregularity.  No pleural or pericardial effusion. No hilar or mediastinal adenopathy. Visualized portions of upper abdomen and  proximal abdominal aorta unremarkable. Usual endplate spurring at multiple contiguous levels in the mid thoracic spine.  IMPRESSION: 1. Stable ascending aortic aneurysm without complicating features.   Electronically Signed   By: DArne ClevelandM.D.   On: 08/28/2013 12:29      Recent Lab Findings: Lab Results  Component Value Date   WBC 6.3 07/29/2017   HGB 10.9 (L) 07/29/2017   HCT 32.5 (L) 07/29/2017   PLT 170 07/29/2017   GLUCOSE 76 09/07/2017   CHOL 93 04/19/2015   TRIG 143 09/07/2016   HDL 31.60 (L) 04/19/2015   LDLDIRECT 28.0 04/19/2015   ALT 24 07/22/2017   AST 31 07/22/2017   NA 138 09/07/2017   K 4.5 09/07/2017   CL 109 09/07/2017   CREATININE 1.91 (H) 09/07/2017   BUN 21 (H) 09/07/2017   CO2 19 (L) 09/07/2017   TSH 4.560 (H) 11/05/2016   INR 1.04 06/02/2017   HGBA1C 5.0 07/22/2017   Study Conclusions  - Left ventricle: The cavity size was normal. There was mild focal basal hypertrophy of the septum. Systolic function was normal. The estimated ejection fraction was in the range of 55% to 60%. Wall motion was normal; there were no regional wall motion abnormalities. Doppler parameters are consistent with abnormal left ventricular relaxation (grade 1 diastolic dysfunction). - Aortic valve: There was mild to moderate regurgitation directed eccentrically in the LVOT and towards the mitral anterior leaflet. - Aorta: Aortic root dimension: 50 mm (ED). - Ascending aorta: The ascending aorta was moderately dilated. - Left atrium: The atrium was mildly dilated.  Impressions:  - Compared to the prior study, there has been no significant interval change.  Echocardiography. M-mode, complete 2D, spectral Doppler, and color Doppler. Birthdate: Patient birthdate: 010-09-1940 Age: Patient is 77yr old. Sex: Gender: male.  BMI: 28.2 kg/m^2. Blood pressure:   112/76 Patient status: Outpatient. Study date: Study date: 09/06/2015. Study time:  11:58 AM. Location: [facility]  -------------------------------------------------------------------  ------------------------------------------------------------------- Left ventricle: The cavity size was normal. There was mild focal basal hypertrophy of the septum. Systolic function was normal. The estimated ejection fraction was in the range of 55% to 60%. Wall motion was normal; there were no regional wall motion abnormalities. Doppler parameters are consistent with abnormal left ventricular relaxation (grade 1 diastolic dysfunction).  ------------------------------------------------------------------- Aortic valve:  Trileaflet; mildly thickened, mildly calcified leaflets. Mobility was not restricted. Doppler: Transvalvular velocity was within the normal range. There was no stenosis. There was mild to moderate regurgitation directed eccentrically in the LVOT and towards the mitral anterior leaflet.  ------------------------------------------------------------------- Aorta: Ascending aorta: The ascending aorta was moderately dilated.  ------------------------------------------------------------------- Mitral valve:  Structurally normal valve.  Mobility was not restricted. Doppler: Transvalvular velocity was within the normal range. There was no evidence for stenosis. There was no regurgitation.  ------------------------------------------------------------------- Left atrium: The atrium was mildly dilated.  ------------------------------------------------------------------- Right ventricle: The cavity size was normal. Wall thickness was normal. Systolic function was normal.  ------------------------------------------------------------------- Pulmonic valve:  Structurally normal valve.  Cusp separation was normal. Doppler: Transvalvular velocity was within the normal range. There was no evidence for stenosis. There was  trivial regurgitation.  ------------------------------------------------------------------- Tricuspid valve:  Structurally normal valve.  Doppler: Transvalvular velocity was within the normal range. There was trivial regurgitation.  ------------------------------------------------------------------- Pulmonary artery:  The main pulmonary artery was normal-sized. Systolic pressure was within the normal range.  ------------------------------------------------------------------- Right atrium: The atrium was normal in size.  ------------------------------------------------------------------- Pericardium: There was no pericardial effusion.  ------------------------------------------------------------------- Systemic veins: Inferior vena cava: The vessel was normal in size.  ------------------------------------------------------------------- Measurements  Left ventricle              Value    Reference LV ID, ED, PLAX chordal         46.9 mm   43 - 52 LV ID, ES, PLAX chordal         33.5 mm   23 - 38 LV fx shortening, PLAX chordal      29  %   >=29 LV PW thickness, ED           10.9 mm   --------- IVS/LV PW ratio, ED           1.17     <=1.3 Stroke volume, 2D            111  ml   --------- Stroke volume/bsa, 2D          50  ml/m^2 --------- LV ejection fraction, 1-p A4C      55  %   --------- LV end-diastolic volume, 2-p       129  ml   --------- LV end-systolic volume, 2-p       55  ml   --------- LV ejection fraction, 2-p        57  %   --------- Stroke volume, 2-p            74  ml   --------- LV end-diastolic volume/bsa, 2-p     58  ml/m^2 --------- LV end-systolic volume/bsa, 2-p     25  ml/m^2 --------- Stroke volume/bsa, 2-p          33.5 ml/m^2 --------- LV e&',  lateral              7.12 cm/s  --------- LV E/e&', lateral             9.28     ---------  LV e&', medial              5.17 cm/s  --------- LV E/e&', medial             12.79    --------- LV e&', average              6.15 cm/s  --------- LV E/e&', average             10.76    ---------  Ventricular septum            Value    Reference IVS thickness, ED            12.7 mm   ---------  LVOT                   Value    Reference LVOT ID, S                25  mm   --------- LVOT area                4.91 cm^2  --------- LVOT ID                 25  mm   --------- LVOT peak velocity, S          95.5 cm/s  --------- LVOT mean velocity, S          65.7 cm/s  --------- LVOT VTI, S               22.7 cm   --------- Stroke volume (SV), LVOT DP       111.4 ml   --------- Stroke index (SV/bsa), LVOT DP      50.5 ml/m^2 ---------  Aortic valve               Value    Reference Aortic regurg pressure half-time     664  ms   ---------  Aorta                  Value    Reference Aortic root ID, ED            50  mm   --------- Ascending aorta ID, A-P, S        44  mm   ---------  Left atrium               Value    Reference LA ID, A-P, ES              43  mm   --------- LA ID/bsa, A-P              1.95 cm/m^2 <=2.2 LA volume, S               67  ml   --------- LA volume/bsa, S             30.4 ml/m^2 --------- LA volume, ES, 1-p A4C          69  ml   --------- LA volume/bsa, ES, 1-p A4C        31.3 ml/m^2 --------- LA volume, ES, 1-p A2C           62  ml   --------- LA volume/bsa, ES, 1-p A2C        28.1 ml/m^2 ---------  Mitral valve               Value    Reference Mitral E-wave peak velocity  66.1 cm/s  --------- Mitral A-wave peak velocity       62.7 cm/s  --------- Mitral deceleration time     (H)   243  ms   150 - 230 Mitral E/A ratio, peak          1.1     ---------  Pulmonary arteries            Value    Reference PA pressure, S, DP            22  mm Hg <=30  Tricuspid valve             Value    Reference Tricuspid regurg peak velocity      217  cm/s  --------- Tricuspid peak RV-RA gradient      19  mm Hg ---------  Systemic veins              Value    Reference Estimated CVP              3   mm Hg ---------  Right ventricle             Value    Reference RV pressure, S, DP            22  mm Hg <=30 RV s&', lateral, S            11.4 cm/s  ---------  Legend: (L) and (H) mark values outside specified reference range.  ------------------------------------------------------------------- Prepared and Electronically Authenticated by  Candee Furbish, M.D. 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 AVD (I35.9).  ------------------------------------------------------------------- History:  PMH: Acquired from the patient and from the patient&'s chart. Mild aortic regurgitation. Deep vein thrombosis. Pulmonary embolic disease. Risk factors: Former tobacco use.  ------------------------------------------------------------------- Study Conclusions  - Left ventricle: The cavity size was normal. Wall thickness was normal.  Systolic function was normal. The estimated ejection fraction was in the range of 50% to 55%. Doppler parameters are consistent with abnormal left ventricular relaxation (grade 1 diastolic dysfunction). - Aortic valve: Trileaflet. There was mild to moderate regurgitation directed centrally in the LVOT. - Aortic root: The aortic root was moderately dilated. Sinuses of Valsalva 51 mm, mid ascending aorta 43 mm.  ------------------------------------------------------------------- Labs, prior tests, procedures, and surgery: Echocardiography (January 2015).   EF was 50%. Ascending aorta 50 mm. Aortic root 47 mm.  Magnetic resonance angiography (January 2015).  The study demonstrated an aneurysm. Sinus of valsalva 15m, 42 sino-tubular junction, and ascending aorta 42 mm. Transthoracic echocardiography. M-mode, complete 2D, spectral Doppler, and color Doppler. Birthdate: Patient birthdate: 0February 07, 1942 Age: Patient is 77yr old. Sex: Gender: male. BMI: 28.8 kg/m^2. Blood pressure:   118/73 Patient status: Outpatient. Study date: Study date: 09/21/2014. Study time: 10:39 AM. Location: [facility]  -------------------------------------------------------------------  ------------------------------------------------------------------- Left ventricle: The cavity size was normal. Wall thickness was normal. Systolic function was normal. The estimated ejection fraction was in the range of 50% to 55%. Doppler parameters are consistent with abnormal left ventricular relaxation (grade 1 diastolic dysfunction). There was no evidence of elevated ventricular filling pressure by Doppler parameters.  ------------------------------------------------------------------- Aortic valve:  Trileaflet. Doppler:  There was no stenosis. There was mild to moderate regurgitation directed centrally in  the LVOT.  ------------------------------------------------------------------- Aorta: Aortic root: The aortic root was moderately dilated. Sinuses of Valsalva 51 mm, mid ascending aorta 43 mm.  ------------------------------------------------------------------- Mitral valve:  Structurally  normal valve.  Leaflet separation was normal. Doppler: Transvalvular velocity was within the normal range. There was no evidence for stenosis. There was no regurgitation.  ------------------------------------------------------------------- Left atrium: The atrium was normal in size.  ------------------------------------------------------------------- Right ventricle: The cavity size was normal. Wall thickness was normal. Systolic function was normal.  ------------------------------------------------------------------- Pulmonic valve:  Structurally normal valve.  Cusp separation was normal. Doppler: Transvalvular velocity was within the normal range. There was no regurgitation.  ------------------------------------------------------------------- Tricuspid valve:  Structurally normal valve.  Leaflet separation was normal. Doppler: Transvalvular velocity was within the normal range. There was no regurgitation.  ------------------------------------------------------------------- Right atrium: The atrium was normal in size.  ------------------------------------------------------------------- Pericardium: There was no pericardial effusion.  Chronic Kidney Disease   Stage I     GFR >90  Stage II    GFR 60-89  Stage IIIA GFR 45-59  Stage IIIB GFR 30-44  Stage IV   GFR 15-29  Stage V    GFR  <15  Lab Results  Component Value Date   CREATININE 1.91 (H) 09/07/2017   CrCl cannot be calculated (Patient's most recent lab result is older than the maximum 21 days allowed.).   Aortic Size Index=      5.0   /Body surface area is 2.13 meters squared. =2.34  < 2.75 cm/m2      4%  risk per year 2.75 to 4.25          8% risk per year > 4.25 cm/m2    20% risk per year  Assessment / Plan:   Dilated Aortic root ascending aorta without history of Bicuspid aortic valve  or Marfan's or associated genetic disease, no family history of Aortic dissection or sudden unexplained death at young age.(sister with brain aneurysm and mother ruptured AAA). There has been very slow enlargement of aortic root over the past 9 years that patient has been followed. Patient should continue on beta blocker, BP control (which appears good ) good dental care (decrease risk of endocarditis). Consider elective root replacement at 5.5 cm.   He's now on anticoagulation with Eliquis due to atrial fibrillation  Follow-up MR of the chest with no contrast, in 1 year  Patient will need follow-up echocardiogram at some point last one was done 18 months ago   Grace Isaac MD      [address] [place] [address] Office [phone]   Beeper [phone]

## 2018-03-10 NOTE — Patient Instructions (Signed)

## 2018-03-15 ENCOUNTER — Telehealth: Payer: Self-pay | Admitting: Internal Medicine

## 2018-03-15 NOTE — Telephone Encounter (Signed)
   [facility] Pre-operative Risk Assessment    Request for surgical clearance:  1. What type of surgery is being performed? Dental extractions  2. When is this surgery scheduled? Not listed  3. What type of clearance is required (medical clearance vs. Pharmacy clearance to hold med vs. Both)? Not listed  4. Are there any medications that need to be held prior to surgery and how long? Not listed  Practice name and name of physician performing surgery? [facility] [name]  5. What is your office phone number not listed   7.   What is your office fax number [phone]  8.   Anesthesia type (None, local, MAC, general) ? Not listed   Derek [name] 03/15/2018, 12:03 PM  _________________________________________________________________   (provider comments below)

## 2018-03-16 NOTE — Telephone Encounter (Addendum)
Pt takes Eliquis for afib with CHADS2VASc score of 4 (age x2, CAD, HTN). Also has hx of remote PE. CrCl is 31m/min. If he is having a single implant, does not need to hold Eliquis. Typically only recommend holding Eliquis for > 2 extractions. Would recommend clarifying with dentist office to see how many extractions pt will need. If he is having > 2, ok to hold Eliquis for 1-2 days prior.

## 2018-03-16 NOTE — Telephone Encounter (Signed)
LMTCB- no sure how many teeth or type of anesthesia. I will ask pharmacy to weigh in on Eliquis while we are waiting for him to call back.  Kerin Ransom PA-C 03/16/2018 1:33 PM

## 2018-03-16 NOTE — Telephone Encounter (Signed)
   Primary Cardiologist: Nelva Bush, MD  Chart reviewed and patient interviewed over the phone today as part of pre-operative protocol coverage. Given past medical history and based on ACC/AHA guidelines, Derek [name] would be at acceptable risk for the planned procedure without further cardiovascular testing.   OK to hold Eliquis 1-2 days pre op if needed.   I will route this recommendation to the requesting party via Epic fax function and remove from pre-op pool.  Please call with questions.  Kerin Ransom, PA-C 03/16/2018, 3:01 PM

## 2018-03-16 NOTE — Telephone Encounter (Signed)
I spoke with the patient it sounds like he will need extensive dental work, possible implant. He is on Eliquis for PAF. I will ask pharmacy about hold recommendations, otherwise he is cleared for dental work from a cardiology standpoint. No need for SBE antibiotics.   Kerin Ransom PA-C 03/16/2018 2:47 PM

## 2018-03-22 DIAGNOSIS — K559 Vascular disorder of intestine, unspecified: Secondary | ICD-10-CM | POA: Diagnosis not present

## 2018-03-22 DIAGNOSIS — M109 Gout, unspecified: Secondary | ICD-10-CM | POA: Diagnosis not present

## 2018-03-22 DIAGNOSIS — D631 Anemia in chronic kidney disease: Secondary | ICD-10-CM | POA: Diagnosis not present

## 2018-03-22 DIAGNOSIS — I129 Hypertensive chronic kidney disease with stage 1 through stage 4 chronic kidney disease, or unspecified chronic kidney disease: Secondary | ICD-10-CM | POA: Diagnosis not present

## 2018-03-22 DIAGNOSIS — N183 Chronic kidney disease, stage 3 (moderate): Secondary | ICD-10-CM | POA: Diagnosis not present

## 2018-03-22 DIAGNOSIS — C189 Malignant neoplasm of colon, unspecified: Secondary | ICD-10-CM | POA: Diagnosis not present

## 2018-03-22 DIAGNOSIS — K635 Polyp of colon: Secondary | ICD-10-CM | POA: Diagnosis not present

## 2018-03-22 DIAGNOSIS — K912 Postsurgical malabsorption, not elsewhere classified: Secondary | ICD-10-CM | POA: Diagnosis not present

## 2018-03-22 DIAGNOSIS — K5090 Crohn's disease, unspecified, without complications: Secondary | ICD-10-CM | POA: Diagnosis not present

## 2018-03-22 DIAGNOSIS — N2581 Secondary hyperparathyroidism of renal origin: Secondary | ICD-10-CM | POA: Diagnosis not present

## 2018-03-22 DIAGNOSIS — R82992 Hyperoxaluria: Secondary | ICD-10-CM | POA: Diagnosis not present

## 2018-03-22 DIAGNOSIS — N200 Calculus of kidney: Secondary | ICD-10-CM | POA: Diagnosis not present

## 2018-03-24 DIAGNOSIS — N184 Chronic kidney disease, stage 4 (severe): Secondary | ICD-10-CM | POA: Diagnosis not present

## 2018-03-24 DIAGNOSIS — I714 Abdominal aortic aneurysm, without rupture: Secondary | ICD-10-CM | POA: Diagnosis not present

## 2018-03-24 DIAGNOSIS — Z6828 Body mass index (BMI) 28.0-28.9, adult: Secondary | ICD-10-CM | POA: Diagnosis not present

## 2018-03-24 DIAGNOSIS — Z01810 Encounter for preprocedural cardiovascular examination: Secondary | ICD-10-CM | POA: Diagnosis not present

## 2018-03-30 DIAGNOSIS — E663 Overweight: Secondary | ICD-10-CM | POA: Diagnosis not present

## 2018-03-30 DIAGNOSIS — Z6828 Body mass index (BMI) 28.0-28.9, adult: Secondary | ICD-10-CM | POA: Diagnosis not present

## 2018-03-30 DIAGNOSIS — M1000 Idiopathic gout, unspecified site: Secondary | ICD-10-CM | POA: Diagnosis not present

## 2018-04-08 IMAGING — DX DG CHEST 1V
1 series · 2 of 2 positions shown · non-contrast
Comparison: Earlier today.

CLINICAL DATA: Central line placement.

EXAM:
CHEST 1 VIEW

[Series 1: chest ap · 0.14mm/px · 2 of 2 slices shown]
[im 1/2]
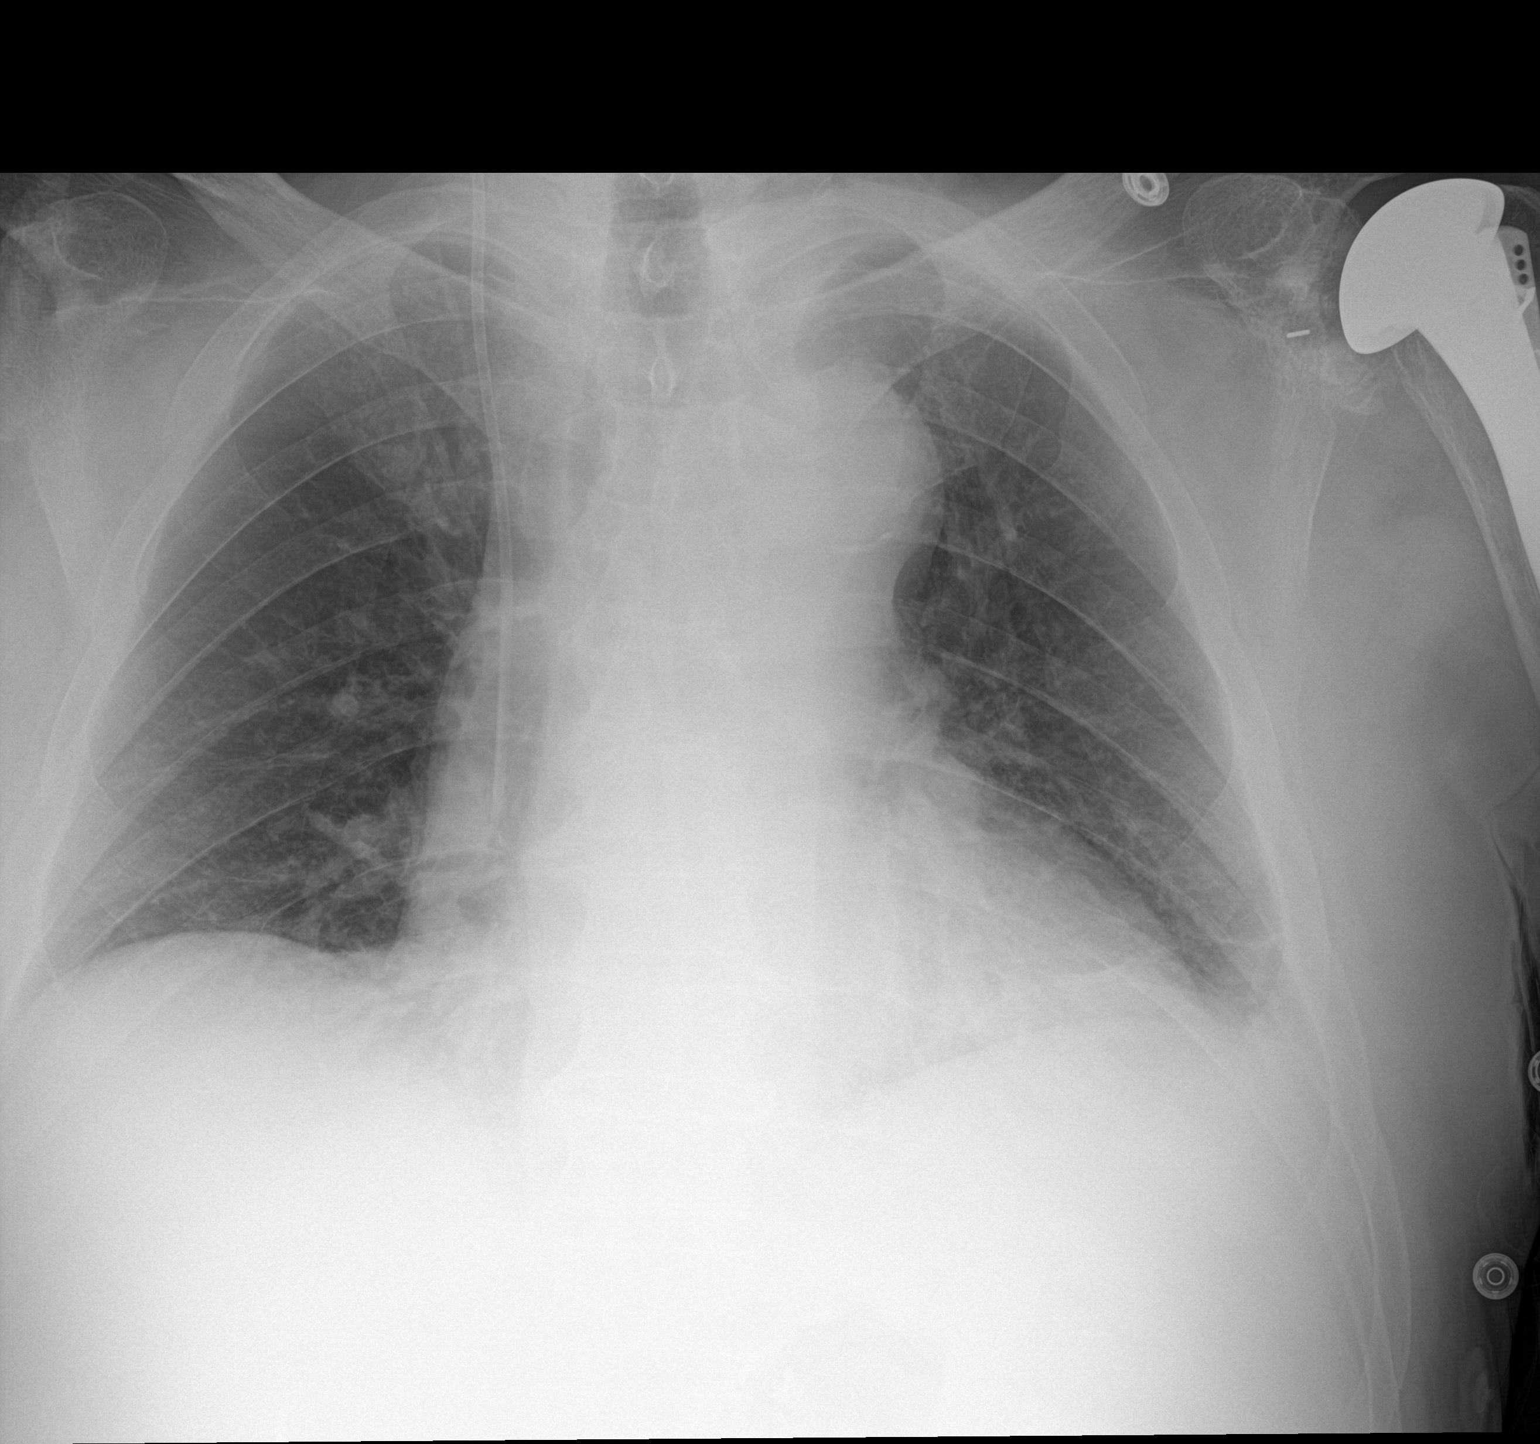
[im 2/2]
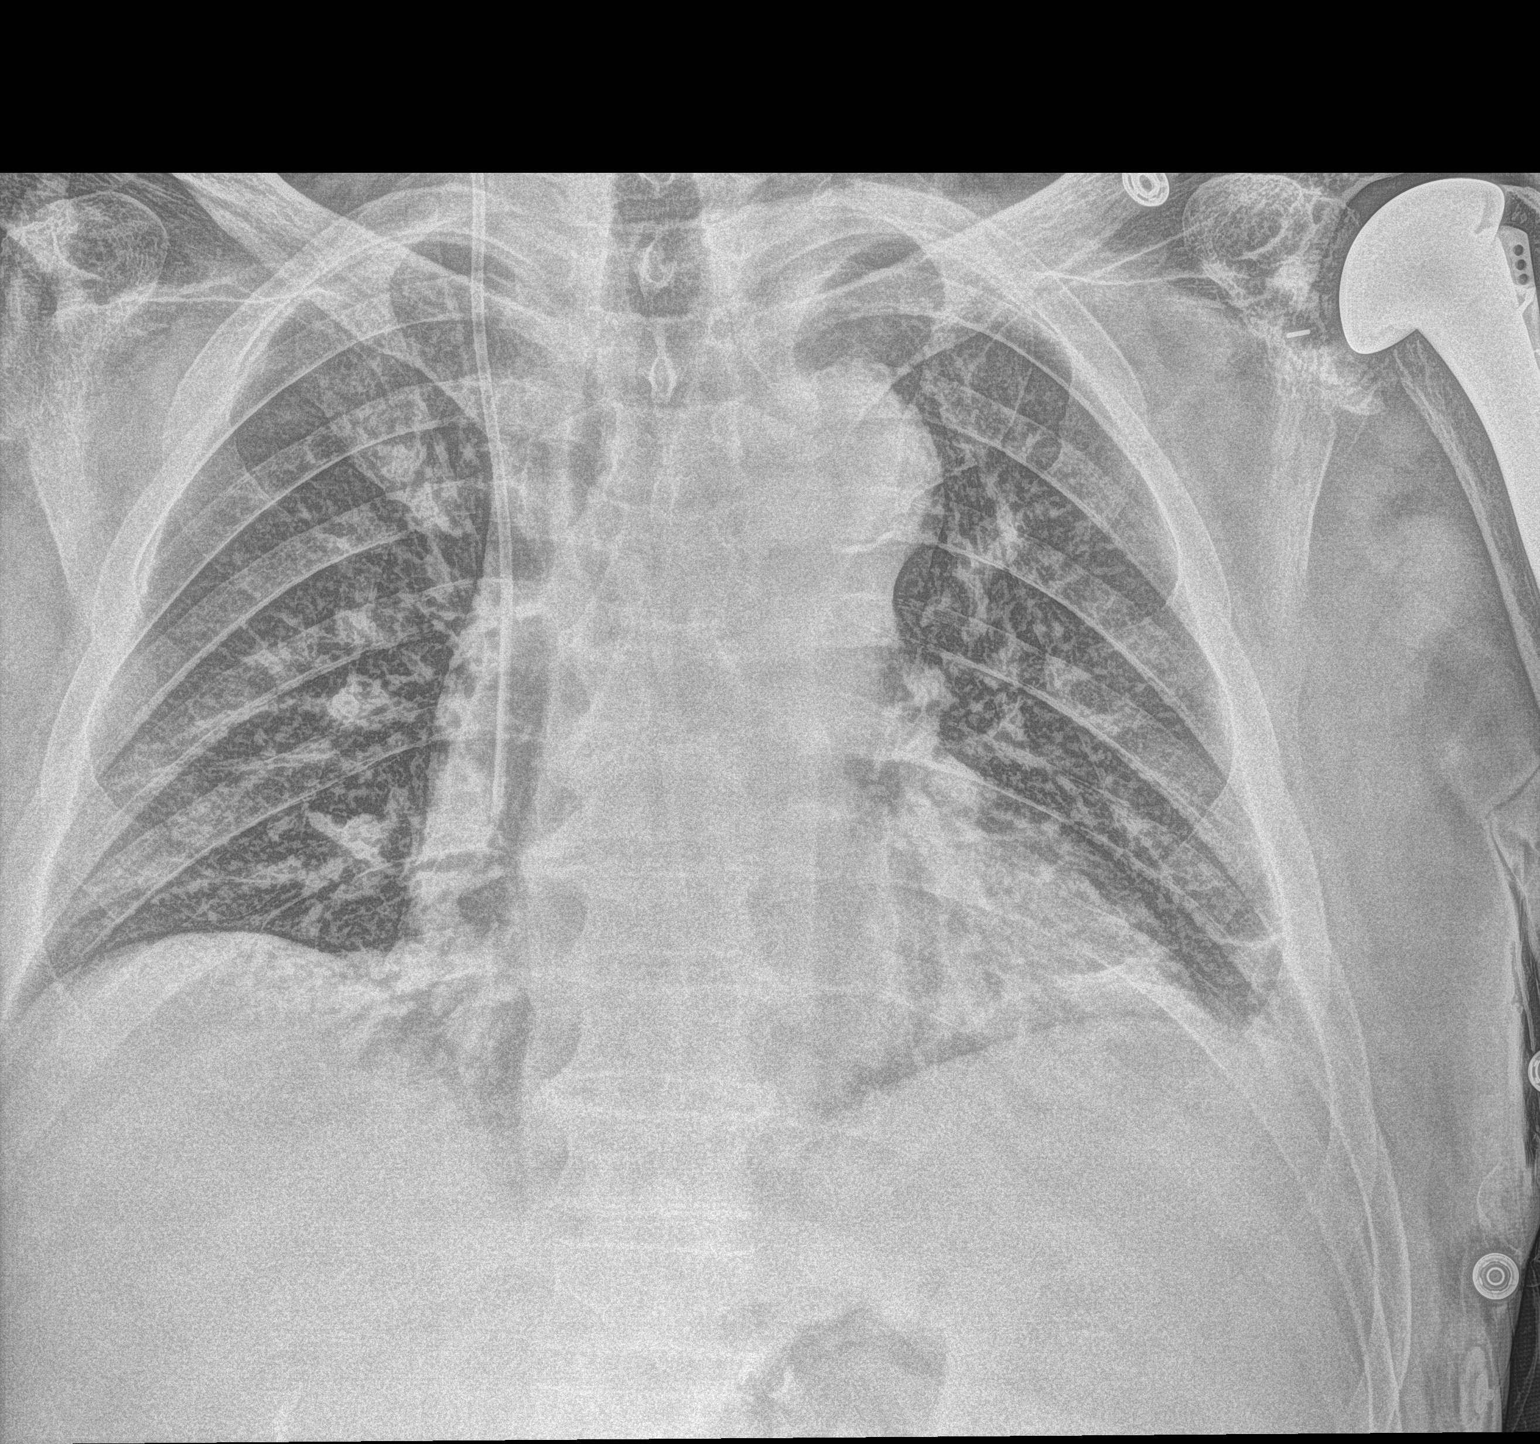

[2 of 2 positions shown; findings below may reference images not displayed]

FINDINGS: Interval enlargement of the cardiac silhouette. Resolved free
peritoneal air. Mild bibasilar atelectasis. Interval right jugular
catheter with its tip at the superior cavoatrial junction. No
pneumothorax. Aortic arch calcification. Diffuse osteopenia,
thoracic spine degenerative changes and left humeral head
prosthesis.
IMPRESSION: 1. Right jugular catheter tip at the superior cavoatrial junction
without pneumothorax.
2. Interval cardiomegaly and mild bibasilar atelectasis.
3. Aortic atherosclerosis.

## 2018-04-08 IMAGING — CT CT ABD-PELV W/O CM
2 of 4 series · 16 of 46 positions shown, 18 images · non-contrast
Comparison: 05/11/2016

CLINICAL DATA: History of recent colon surgery 13 days ago with
abdominal pain

EXAM:
CT ABDOMEN AND PELVIS WITHOUT CONTRAST
TECHNIQUE: Multidetector CT imaging of the abdomen and pelvis was performed
following the standard protocol without IV contrast.

[Series 2: abd/pel w/o · axial · non-contrast · 0.81mm/px · z∈[+1036,+1496]mm · 13 of 104 slices shown, 15 images]
[im 6/104  soft-tissue]
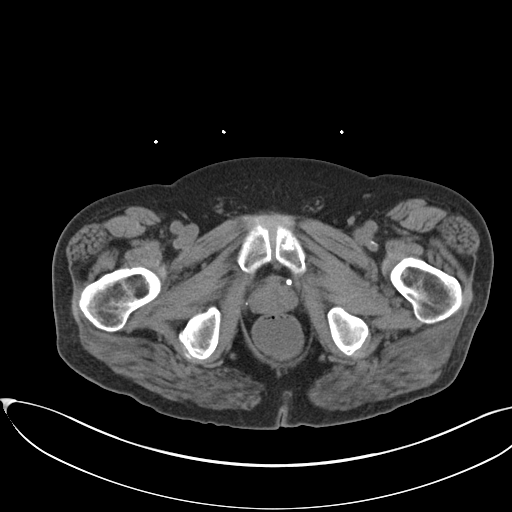
[im 6/104  bone]
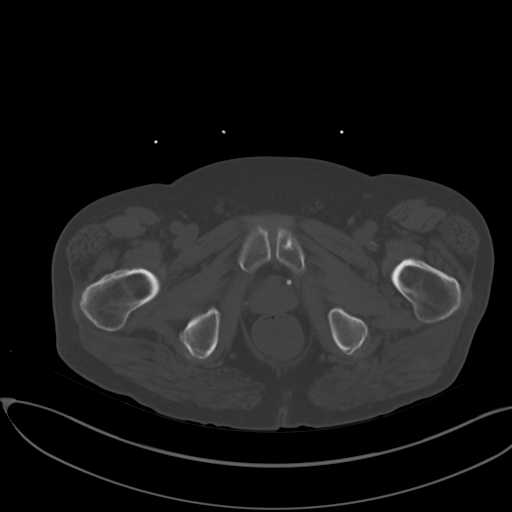
[im 17/104  soft-tissue]
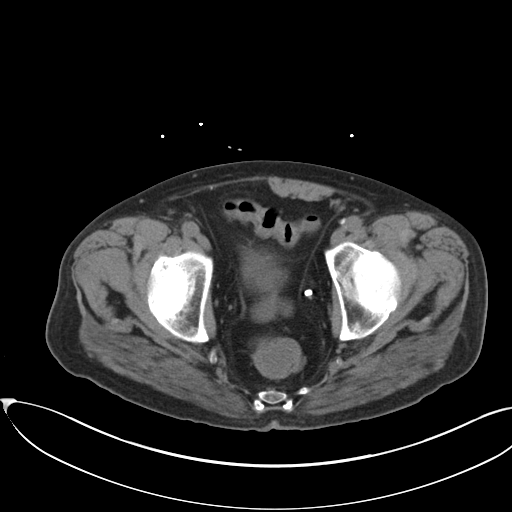
[im 22/104  soft-tissue]
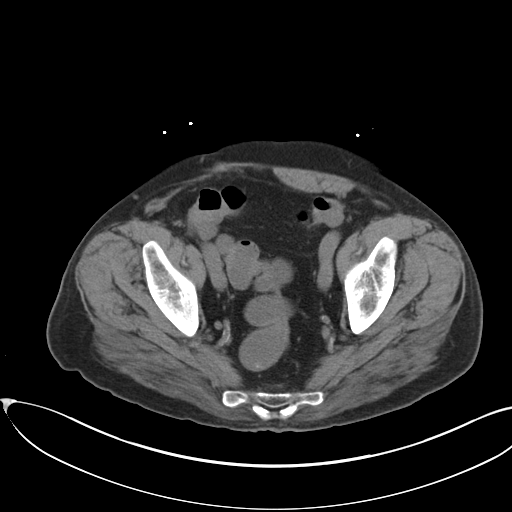
[im 28/104  soft-tissue]
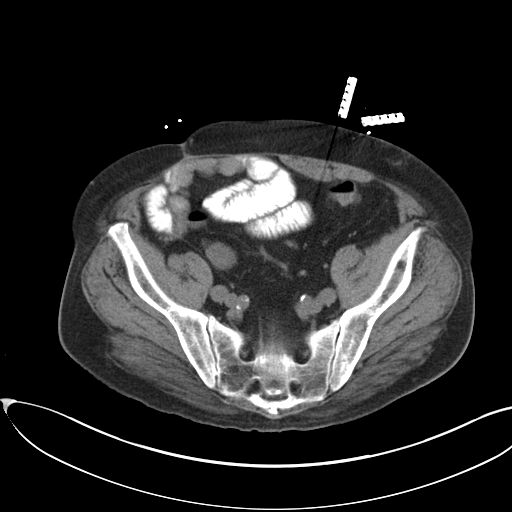
[im 38/104  soft-tissue]
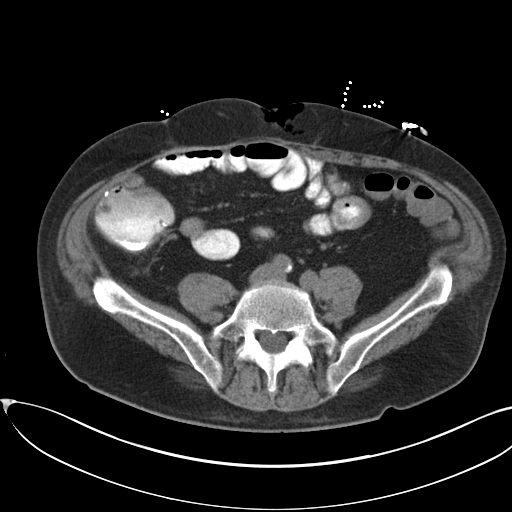
[im 44/104  soft-tissue]
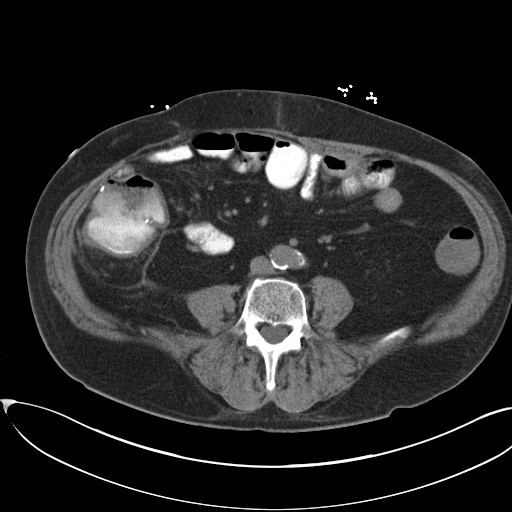
[im 55/104  soft-tissue]
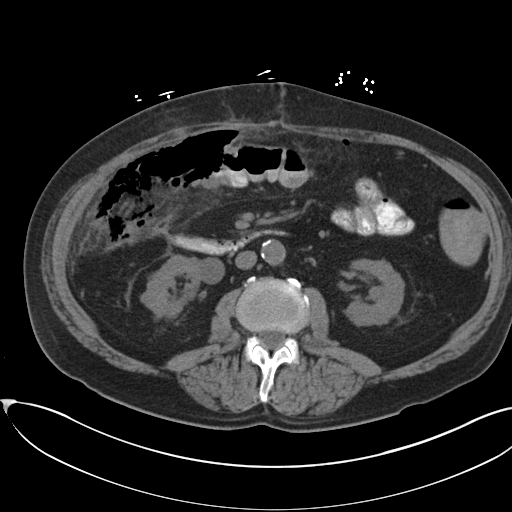
[im 60/104  soft-tissue]
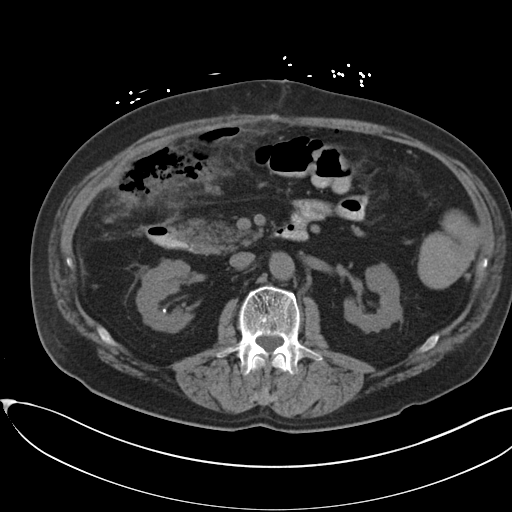
[im 66/104  soft-tissue]
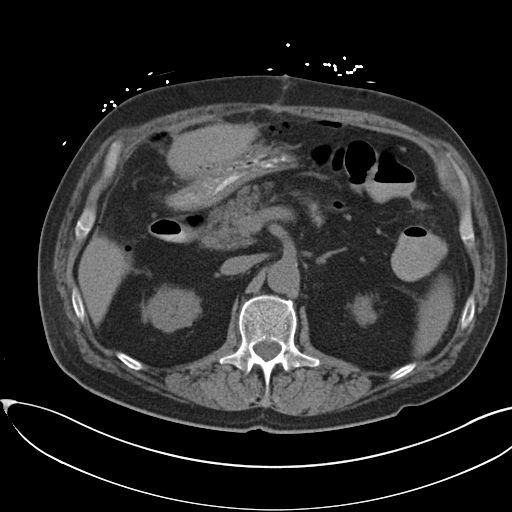
[im 66/104  bone]
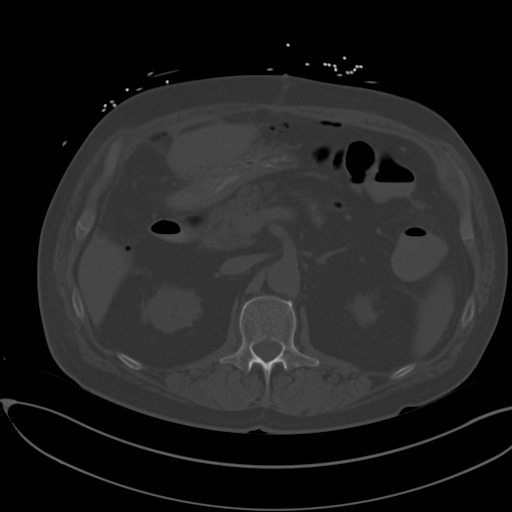
[im 76/104  soft-tissue]
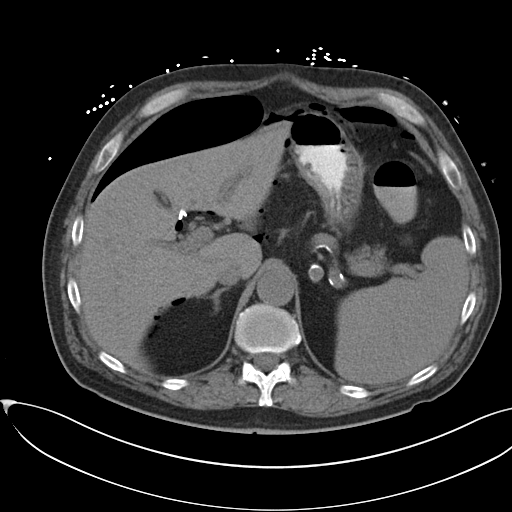
[im 82/104  soft-tissue]
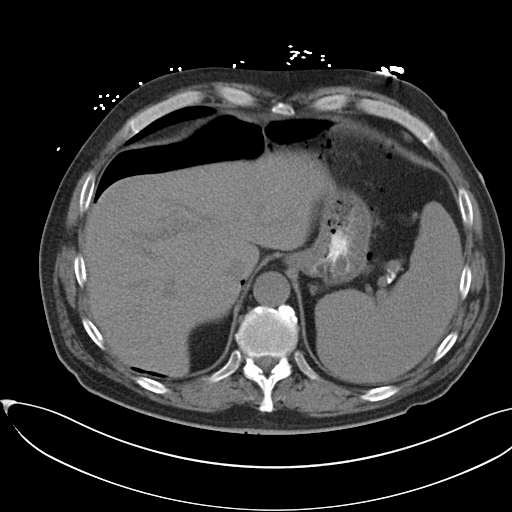
[im 87/104  soft-tissue]
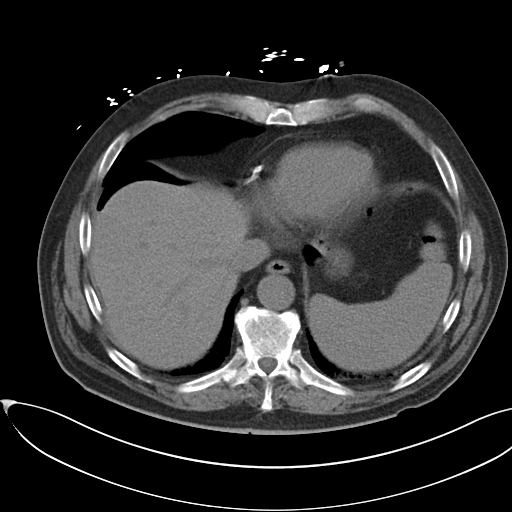
[im 98/104  soft-tissue]
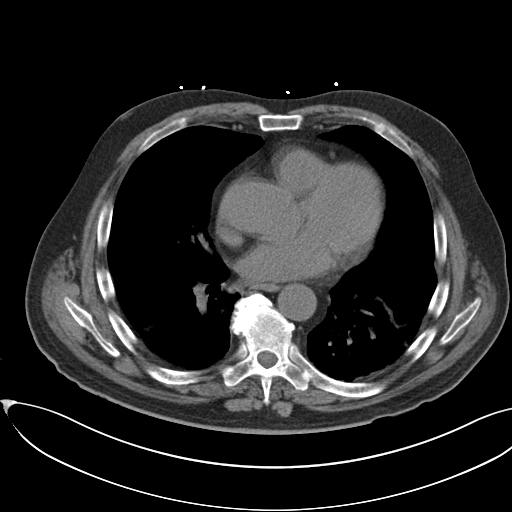

[Series 5: coronal · coronal · 0.83mm/px · 3 of 142 slices shown]
[im 48/142  soft-tissue]
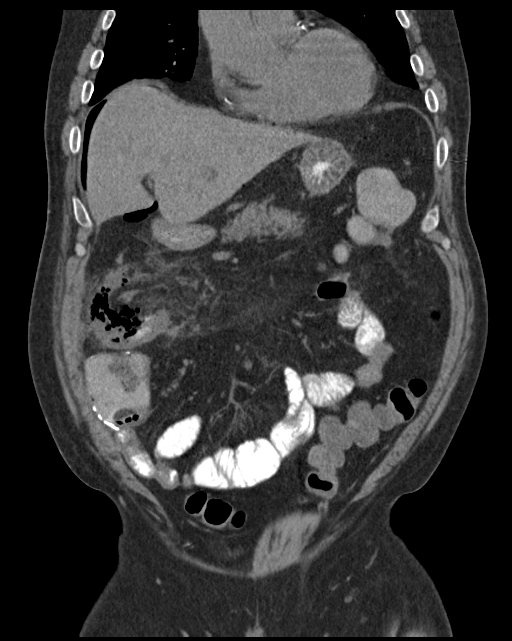
[im 63/142  soft-tissue]
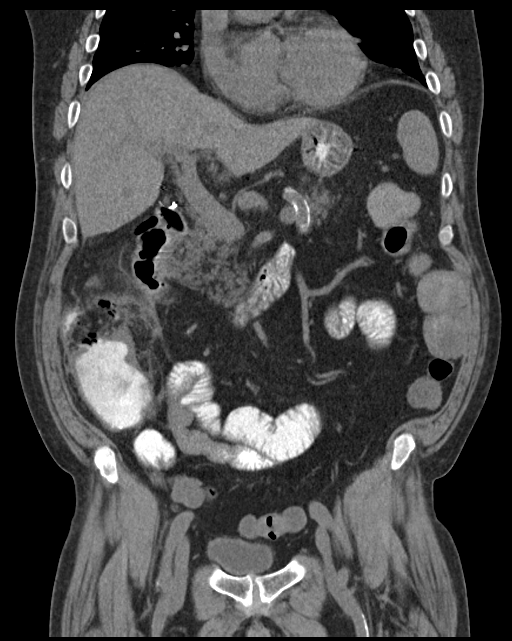
[im 79/142  soft-tissue]
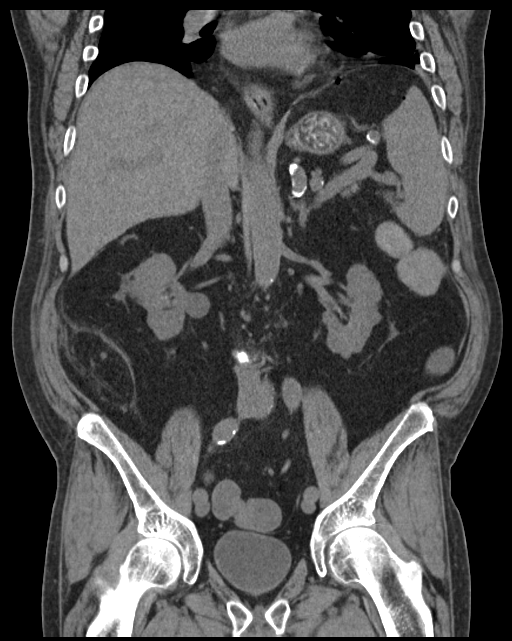

[16 of 46 positions shown; findings below may reference images not displayed]

FINDINGS: Lower chest: Lung bases are well aerated. Stable nodule is noted
along the major fissure on the left unchanged from the prior exam.
Mild scarring is noted in the bases bilaterally. Coronary
calcifications are seen.

Hepatobiliary: The liver is within normal limits. The gallbladder
has been surgically removed.

Pancreas: Unremarkable. No pancreatic ductal dilatation or
surrounding inflammatory changes.

Spleen: Normal in size without focal abnormality.

Adrenals/Urinary Tract: Adrenals are within normal limits
bilaterally. Bilateral nonobstructing renal calculi are seen. Small
renal cysts are noted bilaterally stable from the prior exam.

Stomach/Bowel: Postsurgical changes are noted in the midportion of
the transverse colon consistent with the given clinical history.
There is a large amount of free air and apparent fecal material
extrinsic to the bowel in the right mid abdomen. Considerable free
air is noted as well. Changes are consistent with anastomotic
breakdown. Some mild extravasated contrast is noted in the right
lateral abdomen best seen on image number [id number].

Vascular/Lymphatic: Aortic atherosclerosis. No enlarged abdominal or
pelvic lymph nodes.

Reproductive: Prostate is unremarkable.

Other: No abdominal wall hernia or abnormality. No abdominopelvic
ascites.

Musculoskeletal: Degenerative changes of lumbar spine are noted.
IMPRESSION: Changes consistent with breakdown of recent surgical anastomosis in
the right upper abdomen with spillage of both air and apparent fecal
material as well as contrast in to the peritoneum.

These findings were discussed with the referring surgeon Dr. Albantani
at the time of exam interpretation.

Stable nonobstructing renal calculi

Stable nodule in the left lower lobe

## 2018-05-02 ENCOUNTER — Encounter: Payer: Self-pay | Admitting: Internal Medicine

## 2018-05-02 ENCOUNTER — Ambulatory Visit (INDEPENDENT_AMBULATORY_CARE_PROVIDER_SITE_OTHER): Payer: Medicare Other | Admitting: Internal Medicine

## 2018-05-02 VITALS — BP 114/74 | HR 63 | Ht 72.0 in | Wt 208.0 lb

## 2018-05-02 DIAGNOSIS — I712 Thoracic aortic aneurysm, without rupture, unspecified: Secondary | ICD-10-CM

## 2018-05-02 DIAGNOSIS — Z79899 Other long term (current) drug therapy: Secondary | ICD-10-CM | POA: Diagnosis not present

## 2018-05-02 DIAGNOSIS — I480 Paroxysmal atrial fibrillation: Secondary | ICD-10-CM | POA: Diagnosis not present

## 2018-05-02 DIAGNOSIS — I428 Other cardiomyopathies: Secondary | ICD-10-CM | POA: Diagnosis not present

## 2018-05-02 NOTE — Progress Notes (Signed)
Follow-up Outpatient Visit Date: 05/02/2018  Primary Care Provider: Sharilyn Sites, MD [address] [place] [address]  Chief Complaint: Follow-up atrial fibrillation and NICM  HPI:  [name] is a 77 y.o. year-old male with history of aortic root aneurysm followed by Dr. Servando Snare, aortic regurgitation, nonischemic cardiomyopathy, paroxysmal atrial fibrillation, HTN, hyperlipidemia, chronic kidney disease, Crohns' disease, colon cancer s/p colectomy complicated by ischemic colitis in 08/2016, and remote PE, who presents for follow-up of nonischemic cardiomyopathy and paroxysmal atrial fibrillation.  I last saw Derek [name] in March, at which time he was doing well.  His renal insufficiency was still being closely monitored by Dr. Liana Crocker dating given frequent bowel movements and concerns for dehydration dating back to the 1970s in the setting of Crohn's disease.  Today, Derek [name] reports feeling about the same as at our last visit.  He has noted occasional episodes during which he feels like he needs to take a deep breath when in bed.  It does not happen every night and resolves within a few seconds.  He sleeps on 4 pillows due to his hiatal hernia but also notes that he gets short of breath when he tries to lie completely flat.  He denies chest pain, palpitations, and edema.  He generally sleeps well at night and is well-rested in the morning.  He has been compliant with his medications and has not experienced any bleeding, remaining on apixaban.  --------------------------------------------------------------------------------------------------  Cardiovascular History & Procedures: Cardiovascular Problems:  TAA  Aortic regurgitation  Non-ischemic cardiomyopathy  Paroxysmal atrial fibrillation  Remote pulmonary embolism  Risk Factors:  Hypertension, hyperlipidemia, male gender, and age > 63  Cath/PCI:  None  CV Surgery:  None  EP Procedures and  Devices:  None  Non-Invasive Evaluation(s):  TTE (11/25/16): Normal LV size with LVEFof 55-60% with normal wall motion. Grade 1 diastolic dysfunction. Moderate aortic root dilation (4.8 cm) with mild AI. Mild left atrial enlargement. Normal RV size and function.  MRA chest (10/29/16): Stable TAA measuring 5.3 cm at the sinuses of Valsalva and 4.4 cm above the sinotubular junction.  TEE (05/29/17): LVEF 45-50%. Mild to moderate AI. Moderately dilated aortic root. Dilated left atrium without thrombus. Dilated right atrium. No PFO.  TTE (05/27/17): Normal LV size with moderate LVH. LVEF 45-50%. Mild AI. Aortic root measures up to 5.1 cm. Mitral annular calcification. Normal RV size and function. Normal PA pressure.  TTE (09/06/15): Normal LV size with focal basal septal hypertrophy. LVEF 55-60% with grade 1 diastolic dysfunction. Mild to moderate AI with aortic root measuring up to 5.0 cm. Mild LA enlargement. Normal RV size and function.  Pharmacologic MPI (09/11/13): Normal apical thinning. No ischemia or scar. LVEF 50%.  Recent CV Pertinent Labs: Lab Results  Component Value Date   CHOL 93 04/19/2015   HDL 31.60 (L) 04/19/2015   LDLDIRECT 28.0 04/19/2015   TRIG 143 09/07/2016   CHOLHDL 3 04/19/2015   INR 1.04 06/02/2017   INR 1.70 (L) 01/18/2006   K 3.5 05/02/2018   K 4.5 11/23/2012   MG 1.3 (L) 05/11/2017   BUN 17 05/02/2018   BUN 25.3 11/23/2012   CREATININE 1.79 (H) 05/02/2018   CREATININE 1.8 (H) 09/25/2015   CREATININE 1.9 (H) 11/23/2012    Past medical and surgical history were reviewed and updated in EPIC.  Current Meds  Medication Sig  . Acidophilus Lactobacillus CAPS Take 1 capsule by mouth daily.  Marland Kitchen amiodarone (PACERONE) 200 MG tablet Take 1 tablet (200 mg total)  by mouth daily.  . calcium carbonate (TUMS - DOSED IN MG ELEMENTAL CALCIUM) 500 MG chewable tablet Chew 2 tablets by mouth 2 (two) times daily.  . carboxymethylcellulose (REFRESH PLUS) 0.5 % SOLN Place 1  drop into both eyes 4 (four) times daily.  . Cholecalciferol (VITAMIN D) 2000 units CAPS Take 2,000 Units by mouth daily.  . clindamycin (CLEOCIN T) 1 % external solution Apply 1 application topically daily as needed (head breakouts).  . cyanocobalamin (,VITAMIN B-12,) 1000 MCG/ML injection Inject 1,000 mcg into the muscle every 14 (fourteen) days.  Marland Kitchen ELIQUIS 5 MG TABS tablet Take 1 tablet (5 mg total) by mouth 2 (two) times daily.  . ferrous sulfate 325 (65 FE) MG tablet Take 325 mg by mouth daily with breakfast.  . gabapentin (NEURONTIN) 100 MG capsule Take one (1) capsules (100 mg) by mouth each morning and four (4) capsules (400 mg) by mouth each evening.  . magnesium oxide (MAGNESIUM-OXIDE) 400 (241.3 Mg) MG tablet Take 800 mg by mouth 2 (two) times daily.   . minocycline (DYNACIN) 100 MG tablet Take 100 mg by mouth 2 (two) times daily as needed (face breakout).  . Multiple Vitamin (MULTIVITAMIN WITH MINERALS) TABS tablet Take 1 tablet by mouth daily.  Marland Kitchen omeprazole (PRILOSEC) 20 MG capsule Take 20 mg by mouth daily.    Marland Kitchen PARoxetine (PAXIL) 20 MG tablet Take 20 mg by mouth at bedtime.  Marland Kitchen terazosin (HYTRIN) 2 MG capsule Take 2 mg by mouth at bedtime.  . traZODone (DESYREL) 100 MG tablet Take 100 mg by mouth at bedtime.    Allergies: Lorazepam; Humira [adalimumab]; and Quinolones  Social History   Tobacco Use  . Smoking status: Former Smoker    Packs/day: 2.00    Years: 20.00    Pack years: 40.00    Types: Cigarettes    Last attempt to quit: 08/04/1975    Years since quitting: 42.7  . Smokeless tobacco: Former Systems developer    Types: Chew    Quit date: 08/03/1978  . Tobacco comment: 09/21/2013 "quit smoking in the late 1970's; stopped chewing couple years after I quit smoking"  Substance Use Topics  . Alcohol use: No  . Drug use: No    Family History  Problem Relation Age of Onset  . Kidney disease Father   . Hypertension Father   . Aneurysm Mother   . Aneurysm Sister   . Esophageal  cancer Neg Hx   . Stomach cancer Neg Hx   . Rectal cancer Neg Hx     Review of Systems: A 12-system review of systems was performed and was negative except as noted in the HPI.  --------------------------------------------------------------------------------------------------  Physical Exam: BP 114/74   Pulse 63   Ht 6' (1.829 m)   Wt 208 lb (94.3 kg)   BMI 28.21 kg/m   General:  NAD HEENT: No conjunctival pallor or scleral icterus. Moist mucous membranes.  OP clear. Neck: Supple without lymphadenopathy, thyromegaly, JVD, or HJR. Lungs: Normal work of breathing. Clear to auscultation bilaterally without wheezes or crackles. Heart: Regular rate and rhythm without murmurs, rubs, or gallops. Non-displaced PMI. Abd: Bowel sounds present. Soft, NT/ND without hepatosplenomegaly Ext: No lower extremity edema. Skin: Warm and dry without rash.  EKG:  NSR with left axis deviation, borderline LVH.  Lab Results  Component Value Date   WBC 6.3 05/02/2018   HGB 13.7 05/02/2018   HCT 41.7 05/02/2018   MCV 90 05/02/2018   PLT 120 (L) 05/02/2018  Lab Results  Component Value Date   NA 143 05/02/2018   K 3.5 05/02/2018   CL 104 05/02/2018   CO2 23 05/02/2018   BUN 17 05/02/2018   CREATININE 1.79 (H) 05/02/2018   GLUCOSE 92 05/02/2018   ALT 59 (H) 05/02/2018    Lab Results  Component Value Date   CHOL 93 04/19/2015   HDL 31.60 (L) 04/19/2015   LDLDIRECT 28.0 04/19/2015   TRIG 143 09/07/2016   CHOLHDL 3 04/19/2015    --------------------------------------------------------------------------------------------------  ASSESSMENT AND PLAN: Paroxysmal atrial fibrillation EKG today shows sinus rhythm.  Patient previously asymptomatic while in a-fib.  We will plan to continue indefinite anticoagulation with apixaban as well as amiodarone, given failure to maintain sinus rhythm without antiarrhythmic therapy in the past.  I will check a CBC, CMP, and TSH today.  Patient has  ongoing follow-up with pulmonology, including PFTs.  Nonischemic cardiomyopathy Mr. Coury reports some shortness of breath concerning for PND as well as chronic orthopnea, though this is confounded by his hiatal hernia.  Echo last year showed normal LVEF with mild aortic regurgitation.  We have agreed to defer additional work-up for now.  If his symptoms worsen, we may need to repeat an echo.  Thoracic aortic aneurysm Continue blood pressure control and follow-up with Dr. Servando Snare.  I will defer adding a statin, given low LDL in the past (most recently 28 and 04/2017; continue lipid monitoring with PCP)..  Follow-up: Given my transition to [facility], I will have Mr. Davitt follow-up with Dr. Marlou Porch in 6 months.  Nelva Bush, MD 05/03/2018 9:20 PM

## 2018-05-02 NOTE — Patient Instructions (Addendum)
Medication Instructions:  Your physician recommends that you continue on your current medications as directed. Please refer to the Current Medication list given to you today.  -- If you need a refill on your cardiac medications before your next appointment, please call your pharmacy. --  Labwork:TODAY CBC TSH CMET  Testing/Procedures: None ordered  Follow-Up: Your physician wants you to follow-up in: 67 MONTHS with Dr. Dawna Part will receive a reminder letter in the mail two months in advance. If you don't receive a letter, please call our office to schedule the follow-up appointment.  Thank you for choosing [facility]!!    Any Other Special Instructions Will Be Listed Below (If Applicable).

## 2018-05-03 ENCOUNTER — Encounter: Payer: Self-pay | Admitting: Internal Medicine

## 2018-05-03 LAB — COMPREHENSIVE METABOLIC PANEL
ALT: 59 IU/L — ABNORMAL HIGH (ref 0–44)
AST: 32 IU/L (ref 0–40)
Albumin/Globulin Ratio: 1.8 (ref 1.2–2.2)
Albumin: 4.2 g/dL (ref 3.5–4.8)
Alkaline Phosphatase: 193 IU/L — ABNORMAL HIGH (ref 39–117)
BUN / CREAT RATIO: 9 — AB (ref 10–24)
BUN: 17 mg/dL (ref 8–27)
Bilirubin Total: 0.6 mg/dL (ref 0.0–1.2)
CALCIUM: 8.9 mg/dL (ref 8.6–10.2)
CO2: 23 mmol/L (ref 20–29)
CREATININE: 1.79 mg/dL — AB (ref 0.76–1.27)
Chloride: 104 mmol/L (ref 96–106)
GFR calc Af Amer: 42 mL/min/{1.73_m2} — ABNORMAL LOW (ref >59)
GFR, EST NON AFRICAN AMERICAN: 36 mL/min/{1.73_m2} — AB (ref >59)
GLUCOSE: 92 mg/dL (ref 65–99)
Globulin, Total: 2.3 g/dL (ref 1.5–4.5)
Potassium: 3.5 mmol/L (ref 3.5–5.2)
SODIUM: 143 mmol/L (ref 134–144)
Total Protein: 6.5 g/dL (ref 6.0–8.5)

## 2018-05-03 LAB — CBC
HEMOGLOBIN: 13.7 g/dL (ref 13.0–17.7)
Hematocrit: 41.7 % (ref 37.5–51.0)
MCH: 29.4 pg (ref 26.6–33.0)
MCHC: 32.9 g/dL (ref 31.5–35.7)
MCV: 90 fL (ref 79–97)
Platelets: 120 10*3/uL — ABNORMAL LOW (ref 150–450)
RBC: 4.66 x10E6/uL (ref 4.14–5.80)
RDW: 15.2 % (ref 12.3–15.4)
WBC: 6.3 10*3/uL (ref 3.4–10.8)

## 2018-05-03 LAB — TSH: TSH: 4.09 u[IU]/mL (ref 0.450–4.500)

## 2018-05-04 ENCOUNTER — Telehealth: Payer: Self-pay

## 2018-05-04 NOTE — Telephone Encounter (Signed)
Notes recorded by Frederik Schmidt, RN on 05/04/2018 at 8:18 AM EDT Spoke to patient's wife and informed her of results/recommendations. She verbalized understanding and will relay message.

## 2018-05-04 NOTE — Telephone Encounter (Signed)
-----   Message from Nelva Bush, MD sent at 05/03/2018  9:56 PM EDT ----- Please let Derek [name] know that his platelets are a little low but stable.  His kidney function and electrolytes are also at baseline.  His liver function and thyroid function are both normal.  He should continue his current medications and follow-up as discussed yesterday.

## 2018-05-16 DIAGNOSIS — I712 Thoracic aortic aneurysm, without rupture: Secondary | ICD-10-CM | POA: Diagnosis not present

## 2018-05-16 DIAGNOSIS — I4891 Unspecified atrial fibrillation: Secondary | ICD-10-CM | POA: Diagnosis not present

## 2018-05-16 DIAGNOSIS — C184 Malignant neoplasm of transverse colon: Secondary | ICD-10-CM | POA: Diagnosis not present

## 2018-05-25 ENCOUNTER — Telehealth: Payer: Self-pay | Admitting: Internal Medicine

## 2018-05-25 NOTE — Telephone Encounter (Signed)
We spoke when he was with his wife for her appointment.  He is due or overdue for a surveillance colonoscopy with a history of colon cancer in the setting of Crohn's disease.  We will try to set this up, I am in the process of checking with his cardiologist about holding his Eliquis.

## 2018-05-27 NOTE — Telephone Encounter (Signed)
End, Harrell Gave, MD  [name], MD        Hi Dr. Carlean Purl,   I think it is fine to hold Eliquis for 2 days prior to the colonoscopy and to restart it as soon as you feel that it is safe to do so. Please let me know if any other questions or concerns arise.   Gerald Stabs End   Previous Messages    ----- Message -----  From: [name], MD  Sent: 05/25/2018  5:07 PM EDT  To: Nelva Bush, MD  Subject: Hold Eliquis?                   Hi Dr. Saunders Revel,   This mutual patient needs a surveillance colonoscopy because of a history of Crohn's disease and colon cancer. He is on Eliquis. Would it be acceptable to hold this 2 days prior to his procedure?   Thanks   Silvano Rusk        PLEASE SEE THAT IT IS OK TO HOLD ELIQUIS SO WE CAN MAKE A PREVISIT APPOINTMENT AND COLONOSCOPY APPOINTMENT.  THANKS

## 2018-06-06 ENCOUNTER — Encounter: Payer: Self-pay | Admitting: Internal Medicine

## 2018-06-06 NOTE — Telephone Encounter (Signed)
Spoke to Derek [name] and scheduled his procedure for 06-23-18 and PV this Friday 06-10-18.

## 2018-06-10 ENCOUNTER — Ambulatory Visit (AMBULATORY_SURGERY_CENTER): Payer: Self-pay

## 2018-06-10 VITALS — Ht 72.0 in | Wt 212.0 lb

## 2018-06-10 DIAGNOSIS — Z86010 Personal history of colonic polyps: Secondary | ICD-10-CM

## 2018-06-10 NOTE — Progress Notes (Signed)
Denies allergies to eggs or soy products. Denies complication of anesthesia or sedation. Denies use of weight loss medication. Denies use of O2.   Emmi instructions declined.  

## 2018-06-23 ENCOUNTER — Ambulatory Visit (AMBULATORY_SURGERY_CENTER): Payer: Medicare Other | Admitting: Internal Medicine

## 2018-06-23 ENCOUNTER — Encounter: Payer: Self-pay | Admitting: Internal Medicine

## 2018-06-23 VITALS — BP 114/57 | HR 52 | Temp 97.1°F | Resp 13

## 2018-06-23 DIAGNOSIS — K633 Ulcer of intestine: Secondary | ICD-10-CM | POA: Diagnosis not present

## 2018-06-23 DIAGNOSIS — Z86010 Personal history of colonic polyps: Secondary | ICD-10-CM | POA: Diagnosis not present

## 2018-06-23 DIAGNOSIS — K5090 Crohn's disease, unspecified, without complications: Secondary | ICD-10-CM

## 2018-06-23 DIAGNOSIS — K9189 Other postprocedural complications and disorders of digestive system: Secondary | ICD-10-CM | POA: Diagnosis not present

## 2018-06-23 DIAGNOSIS — Z85038 Personal history of other malignant neoplasm of large intestine: Secondary | ICD-10-CM | POA: Diagnosis not present

## 2018-06-23 DIAGNOSIS — K5000 Crohn's disease of small intestine without complications: Secondary | ICD-10-CM

## 2018-06-23 MED ORDER — SODIUM CHLORIDE 0.9 % IV SOLN: 500.0000 mL | Freq: Once | INTRAVENOUS | Status: DC

## 2018-06-23 NOTE — Progress Notes (Signed)
PT taken to PACU. Monitors in place. VSS. Report given to RN. 

## 2018-06-23 NOTE — Progress Notes (Signed)
Pt's states no medical or surgical changes since previsit or office visit. 

## 2018-06-23 NOTE — Progress Notes (Signed)
Called to room to assist during endoscopic procedure.  Patient ID and intended procedure confirmed with present staff. Received instructions for my participation in the procedure from the performing physician.  

## 2018-06-23 NOTE — Patient Instructions (Addendum)
No polyps seen.  The crohn's is active - I took biopsies to confirm and will let you know.  We will consider repeating a colonoscopy in 3 years given the cancer history.  restart Eliquis tomorrow.  I appreciate the opportunity to care for you. [name], MD, FACG YOU HAD AN ENDOSCOPIC PROCEDURE TODAY AT [facility]:   Refer to the procedure report that was given to you for any specific questions about what was found during the examination.  If the procedure report does not answer your questions, please call your gastroenterologist to clarify.  If you requested that your care partner not be given the details of your procedure findings, then the procedure report has been included in a sealed envelope for you to review at your convenience later.  YOU SHOULD EXPECT: Some feelings of bloating in the abdomen. Passage of more gas than usual.  Walking can help get rid of the air that was put into your GI tract during the procedure and reduce the bloating. If you had a lower endoscopy (such as a colonoscopy or flexible sigmoidoscopy) you may notice spotting of blood in your stool or on the toilet paper. If you underwent a bowel prep for your procedure, you may not have a normal bowel movement for a few days.  Please Note:  You might notice some irritation and congestion in your nose or some drainage.  This is from the oxygen used during your procedure.  There is no need for concern and it should clear up in a day or so.  SYMPTOMS TO REPORT IMMEDIATELY:   Following lower endoscopy (colonoscopy or flexible sigmoidoscopy):  Excessive amounts of blood in the stool  Significant tenderness or worsening of abdominal pains  Swelling of the abdomen that is new, acute  Fever of 100F or higher  For urgent or emergent issues, a gastroenterologist can be reached at any hour by calling [phone].   DIET:  We do recommend a small meal at first, but then you may proceed to  your regular diet.  Drink plenty of fluids but you should avoid alcoholic beverages for 24 hours.  ACTIVITY:  You should plan to take it easy for the rest of today and you should NOT DRIVE or use heavy machinery until tomorrow (because of the sedation medicines used during the test).    FOLLOW UP: Our staff will call the number listed on your records the next business day following your procedure to check on you and address any questions or concerns that you may have regarding the information given to you following your procedure. If we do not reach you, we will leave a message.  However, if you are feeling well and you are not experiencing any problems, there is no need to return our call.  We will assume that you have returned to your regular daily activities without incident.  If any biopsies were taken you will be contacted by phone or by letter within the next 1-3 weeks.  Please call us at [phone] if you have not heard about the biopsies in 3 weeks.   Await for biopsy results Restart Eliquis tomorrow Hemorrhoids (handout given)   SIGNATURES/CONFIDENTIALITY: You and/or your care partner have signed paperwork which will be entered into your electronic medical record.  These signatures attest to the fact that that the information above on your After Visit Summary has been reviewed and is understood.  Full responsibility of the confidentiality of this discharge information  lies with you and/or your care-partner.

## 2018-06-23 NOTE — Op Note (Addendum)
[place] Patient Name: Derek [name] Procedure Date: 06/23/2018 8:42 AM MRN: [id number] Endoscopist: [name] , MD Age: 77 Referring MD:  Date of Birth: [birth year] Gender: Male Account #: [id number] Procedure:                Colonoscopy Indications:              High risk colon cancer surveillance: Personal                            history of colon cancer Medicines:                Propofol per Anesthesia, Monitored Anesthesia Care Procedure:                Pre-Anesthesia Assessment:                           - Prior to the procedure, a History and Physical                            was performed, and patient medications and                            allergies were reviewed. The patient's tolerance of                            previous anesthesia was also reviewed. The risks                            and benefits of the procedure and the sedation                            options and risks were discussed with the patient.                            All questions were answered, and informed consent                            was obtained. Prior Anticoagulants: The patient                            last took Eliquis (apixaban) 2 days prior to the                            procedure. ASA Grade Assessment: III - A patient                            with severe systemic disease. After reviewing the                            risks and benefits, the patient was deemed in                            satisfactory condition to undergo the procedure.  After obtaining informed consent, the colonoscope                            was passed under direct vision. Throughout the                            procedure, the patient's blood pressure, pulse, and                            oxygen saturations were monitored continuously. The                            Colonoscope was introduced through the anus and                            advanced to  the the ileocolonic anastomosis. The                            colonoscopy was performed without difficulty. The                            patient tolerated the procedure well. The quality                            of the bowel preparation was adequate. The rectum                            and Ileocolonic anastomsis areas were photographed.                            The bowel preparation used was Miralax. Scope In: 8:52:11 AM Scope Out: 9:06:20 AM Scope Withdrawal Time: 0 hours 11 minutes 30 seconds  Total Procedure Duration: 0 hours 14 minutes 9 seconds  Findings:                 The perianal and digital rectal examinations were                            normal. Pertinent negatives include normal prostate                            (size, shape, and consistency).                           Inflammation characterized by serpentine                            ulcerations was found. Biopsies were taken with a                            cold forceps for histology. Verification of patient                            identification for the specimen was done. Estimated  blood loss was minimal.                           There was evidence of a prior end-to-end                            ileo-colonic anastomosis in the transverse colon.                            This was patent and was characterized by ulceration                            and visible sutures.                           External and internal hemorrhoids were found. The                            hemorrhoids were small.                           The exam was otherwise without abnormality on                            direct and retroflexion views. Complications:            No immediate complications. Estimated Blood Loss:     Estimated blood loss was minimal. Impression:               - Crohn's disease with ileitis. Inflammation was                            found. Biopsied.                            - Patent end-to-end ileo-colonic anastomosis,                            characterized by ulceration and visible sutures.                           - External and internal hemorrhoids.                           - The examination was otherwise normal on direct                            and retroflexion views. Recommendation:           - Patient has a contact number available for                            emergencies. The signs and symptoms of potential                            delayed complications were discussed with the  patient. Return to normal activities tomorrow.                            Written discharge instructions were provided to the                            patient.                           - Resume previous diet.                           - Continue present medications.                           - Repeat colonoscopy is recommended for                            surveillance. The colonoscopy date will be                            determined after pathology results from today's                            exam become available for review.                           - Will contact him with pathology report and will                            consider treatment vs observation of Crohn's                           Will consider routine repeat colonoscopy in 3 years                            given cancer hx                           - Resume Eliquis (apixaban) at prior dose tomorrow. [name], MD 06/23/2018 9:18:11 AM This report has been signed electronically.

## 2018-06-24 ENCOUNTER — Telehealth: Payer: Self-pay

## 2018-06-24 NOTE — Telephone Encounter (Signed)
  Follow up Call-  Call back number 06/23/2018 03/18/2016  Post procedure Call Back phone  # [phone]  Permission to leave phone message Yes Yes  Some recent data might be hidden     Patient questions:  Do you have a fever, pain , or abdominal swelling? No. Pain Score  0 *  Have you tolerated food without any problems? Yes.    Have you been able to return to your normal activities? Yes.    Do you have any questions about your discharge instructions: Diet   No. Medications  No. Follow up visit  No.  Do you have questions or concerns about your Care? No.  Actions: * If pain score is 4 or above: No action needed, pain <4.

## 2018-07-03 ENCOUNTER — Encounter: Payer: Self-pay | Admitting: Internal Medicine

## 2018-07-03 NOTE — Progress Notes (Signed)
Biopsies suggest ulcers typical of anastomosis ulceration and NOT Crohn's  My Chart letter will ask him to schedule an appointment  Please be sure to send results to his PCP and Dr. [name]  Recall 3 years colon

## 2018-07-07 DIAGNOSIS — D225 Melanocytic nevi of trunk: Secondary | ICD-10-CM | POA: Diagnosis not present

## 2018-07-07 DIAGNOSIS — L821 Other seborrheic keratosis: Secondary | ICD-10-CM | POA: Diagnosis not present

## 2018-07-07 DIAGNOSIS — L570 Actinic keratosis: Secondary | ICD-10-CM | POA: Diagnosis not present

## 2018-07-07 DIAGNOSIS — L820 Inflamed seborrheic keratosis: Secondary | ICD-10-CM | POA: Diagnosis not present

## 2018-07-07 DIAGNOSIS — L738 Other specified follicular disorders: Secondary | ICD-10-CM | POA: Diagnosis not present

## 2018-07-07 DIAGNOSIS — L814 Other melanin hyperpigmentation: Secondary | ICD-10-CM | POA: Diagnosis not present

## 2018-07-07 DIAGNOSIS — D1801 Hemangioma of skin and subcutaneous tissue: Secondary | ICD-10-CM | POA: Diagnosis not present

## 2018-07-20 DIAGNOSIS — H01021 Squamous blepharitis right upper eyelid: Secondary | ICD-10-CM | POA: Diagnosis not present

## 2018-07-20 DIAGNOSIS — H01022 Squamous blepharitis right lower eyelid: Secondary | ICD-10-CM | POA: Diagnosis not present

## 2018-07-20 DIAGNOSIS — H10413 Chronic giant papillary conjunctivitis, bilateral: Secondary | ICD-10-CM | POA: Diagnosis not present

## 2018-07-20 DIAGNOSIS — Z961 Presence of intraocular lens: Secondary | ICD-10-CM | POA: Diagnosis not present

## 2018-07-20 DIAGNOSIS — H01024 Squamous blepharitis left upper eyelid: Secondary | ICD-10-CM | POA: Diagnosis not present

## 2018-07-20 DIAGNOSIS — L718 Other rosacea: Secondary | ICD-10-CM | POA: Diagnosis not present

## 2018-07-20 DIAGNOSIS — H01025 Squamous blepharitis left lower eyelid: Secondary | ICD-10-CM | POA: Diagnosis not present

## 2018-07-22 DIAGNOSIS — B079 Viral wart, unspecified: Secondary | ICD-10-CM | POA: Diagnosis not present

## 2018-07-22 DIAGNOSIS — L718 Other rosacea: Secondary | ICD-10-CM | POA: Diagnosis not present

## 2018-08-23 DIAGNOSIS — Z8739 Personal history of other diseases of the musculoskeletal system and connective tissue: Secondary | ICD-10-CM | POA: Insufficient documentation

## 2018-08-23 DIAGNOSIS — M25562 Pain in left knee: Secondary | ICD-10-CM | POA: Diagnosis not present

## 2018-08-23 DIAGNOSIS — M1712 Unilateral primary osteoarthritis, left knee: Secondary | ICD-10-CM | POA: Insufficient documentation

## 2018-08-25 ENCOUNTER — Ambulatory Visit (HOSPITAL_COMMUNITY)
Admission: RE | Admit: 2018-08-25 | Discharge: 2018-08-25 | Disposition: A | Discharge status: Home / Self Care | Payer: Medicare Other | Source: Ambulatory Visit | Attending: Family Medicine | Admitting: Family Medicine

## 2018-08-25 ENCOUNTER — Other Ambulatory Visit (HOSPITAL_COMMUNITY): Payer: Self-pay | Admitting: Family Medicine

## 2018-08-25 DIAGNOSIS — R0602 Shortness of breath: Secondary | ICD-10-CM | POA: Insufficient documentation

## 2018-08-25 DIAGNOSIS — M546 Pain in thoracic spine: Secondary | ICD-10-CM | POA: Diagnosis not present

## 2018-08-25 DIAGNOSIS — E663 Overweight: Secondary | ICD-10-CM | POA: Diagnosis not present

## 2018-08-25 DIAGNOSIS — Z6829 Body mass index (BMI) 29.0-29.9, adult: Secondary | ICD-10-CM | POA: Diagnosis not present

## 2018-08-25 DIAGNOSIS — Z1389 Encounter for screening for other disorder: Secondary | ICD-10-CM | POA: Diagnosis not present

## 2018-09-05 DIAGNOSIS — M546 Pain in thoracic spine: Secondary | ICD-10-CM | POA: Diagnosis not present

## 2018-09-05 DIAGNOSIS — M47814 Spondylosis without myelopathy or radiculopathy, thoracic region: Secondary | ICD-10-CM | POA: Diagnosis not present

## 2018-09-05 DIAGNOSIS — I712 Thoracic aortic aneurysm, without rupture: Secondary | ICD-10-CM | POA: Diagnosis not present

## 2018-09-05 DIAGNOSIS — K50119 Crohn's disease of large intestine with unspecified complications: Secondary | ICD-10-CM | POA: Diagnosis not present

## 2018-09-05 DIAGNOSIS — Z6829 Body mass index (BMI) 29.0-29.9, adult: Secondary | ICD-10-CM | POA: Diagnosis not present

## 2018-09-05 DIAGNOSIS — I4891 Unspecified atrial fibrillation: Secondary | ICD-10-CM | POA: Diagnosis not present

## 2018-09-05 DIAGNOSIS — N184 Chronic kidney disease, stage 4 (severe): Secondary | ICD-10-CM | POA: Diagnosis not present

## 2018-09-12 DIAGNOSIS — M5134 Other intervertebral disc degeneration, thoracic region: Secondary | ICD-10-CM | POA: Diagnosis not present

## 2018-09-12 DIAGNOSIS — I2510 Atherosclerotic heart disease of native coronary artery without angina pectoris: Secondary | ICD-10-CM | POA: Diagnosis not present

## 2018-09-12 DIAGNOSIS — R911 Solitary pulmonary nodule: Secondary | ICD-10-CM | POA: Diagnosis not present

## 2018-09-12 DIAGNOSIS — M4324 Fusion of spine, thoracic region: Secondary | ICD-10-CM | POA: Diagnosis not present

## 2018-09-12 DIAGNOSIS — I712 Thoracic aortic aneurysm, without rupture: Secondary | ICD-10-CM | POA: Diagnosis not present

## 2018-09-12 DIAGNOSIS — J479 Bronchiectasis, uncomplicated: Secondary | ICD-10-CM | POA: Diagnosis not present

## 2018-09-12 DIAGNOSIS — Z96612 Presence of left artificial shoulder joint: Secondary | ICD-10-CM | POA: Diagnosis not present

## 2018-09-16 DIAGNOSIS — K635 Polyp of colon: Secondary | ICD-10-CM | POA: Diagnosis not present

## 2018-09-16 DIAGNOSIS — R82992 Hyperoxaluria: Secondary | ICD-10-CM | POA: Diagnosis not present

## 2018-09-16 DIAGNOSIS — N2581 Secondary hyperparathyroidism of renal origin: Secondary | ICD-10-CM | POA: Diagnosis not present

## 2018-09-16 DIAGNOSIS — D631 Anemia in chronic kidney disease: Secondary | ICD-10-CM | POA: Diagnosis not present

## 2018-09-16 DIAGNOSIS — C189 Malignant neoplasm of colon, unspecified: Secondary | ICD-10-CM | POA: Diagnosis not present

## 2018-09-16 DIAGNOSIS — I129 Hypertensive chronic kidney disease with stage 1 through stage 4 chronic kidney disease, or unspecified chronic kidney disease: Secondary | ICD-10-CM | POA: Diagnosis not present

## 2018-09-16 DIAGNOSIS — K5090 Crohn's disease, unspecified, without complications: Secondary | ICD-10-CM | POA: Diagnosis not present

## 2018-09-16 DIAGNOSIS — N189 Chronic kidney disease, unspecified: Secondary | ICD-10-CM | POA: Diagnosis not present

## 2018-09-16 DIAGNOSIS — N200 Calculus of kidney: Secondary | ICD-10-CM | POA: Diagnosis not present

## 2018-09-16 DIAGNOSIS — M109 Gout, unspecified: Secondary | ICD-10-CM | POA: Diagnosis not present

## 2018-09-16 DIAGNOSIS — E538 Deficiency of other specified B group vitamins: Secondary | ICD-10-CM | POA: Diagnosis not present

## 2018-09-16 DIAGNOSIS — N183 Chronic kidney disease, stage 3 (moderate): Secondary | ICD-10-CM | POA: Diagnosis not present

## 2018-09-16 DIAGNOSIS — K912 Postsurgical malabsorption, not elsewhere classified: Secondary | ICD-10-CM | POA: Diagnosis not present

## 2018-10-27 DIAGNOSIS — Z6829 Body mass index (BMI) 29.0-29.9, adult: Secondary | ICD-10-CM | POA: Diagnosis not present

## 2018-10-27 DIAGNOSIS — I10 Essential (primary) hypertension: Secondary | ICD-10-CM | POA: Diagnosis not present

## 2018-10-27 DIAGNOSIS — E663 Overweight: Secondary | ICD-10-CM | POA: Diagnosis not present

## 2018-10-27 DIAGNOSIS — R457 State of emotional shock and stress, unspecified: Secondary | ICD-10-CM | POA: Diagnosis not present

## 2018-10-27 DIAGNOSIS — F419 Anxiety disorder, unspecified: Secondary | ICD-10-CM | POA: Diagnosis not present

## 2018-10-27 DIAGNOSIS — R946 Abnormal results of thyroid function studies: Secondary | ICD-10-CM | POA: Diagnosis not present

## 2018-11-25 DIAGNOSIS — M109 Gout, unspecified: Secondary | ICD-10-CM | POA: Diagnosis not present

## 2018-11-25 DIAGNOSIS — E663 Overweight: Secondary | ICD-10-CM | POA: Diagnosis not present

## 2018-11-25 DIAGNOSIS — Z6829 Body mass index (BMI) 29.0-29.9, adult: Secondary | ICD-10-CM | POA: Diagnosis not present

## 2018-12-22 ENCOUNTER — Telehealth (INDEPENDENT_AMBULATORY_CARE_PROVIDER_SITE_OTHER): Payer: Medicare Other | Admitting: Cardiology

## 2018-12-22 ENCOUNTER — Other Ambulatory Visit: Payer: Self-pay

## 2018-12-22 VITALS — Ht 72.0 in

## 2018-12-22 DIAGNOSIS — I428 Other cardiomyopathies: Secondary | ICD-10-CM

## 2018-12-22 DIAGNOSIS — I480 Paroxysmal atrial fibrillation: Secondary | ICD-10-CM | POA: Diagnosis not present

## 2018-12-22 DIAGNOSIS — I712 Thoracic aortic aneurysm, without rupture, unspecified: Secondary | ICD-10-CM

## 2018-12-22 DIAGNOSIS — N183 Chronic kidney disease, stage 3 unspecified: Secondary | ICD-10-CM

## 2018-12-22 NOTE — Patient Instructions (Signed)
Medication Instructions:  Your physician recommends that you continue on your current medications as directed. Please refer to the Current Medication list given to you today.  If you need a refill on your cardiac medications before your next appointment, please call your pharmacy.   Lab work: None If you have labs (blood work) drawn today and your tests are completely normal, you will receive your results only by: Marland Kitchen MyChart Message (if you have MyChart) OR . A paper copy in the mail If you have any lab test that is abnormal or we need to change your treatment, we will call you to review the results.  Testing/Procedures: None  Follow-Up: Your physician wants you to follow-up in: 6 months with Truitt Merle, NP. You will receive a reminder letter in the mail two months in advance. If you don't receive a letter, please call our office to schedule the follow-up appointment.  At [facility], you and your health needs are our priority.  As part of our continuing mission to provide you with exceptional heart care, we have created designated Provider Care Teams.  These Care Teams include your primary Cardiologist (physician) and Advanced Practice Providers (APPs -  Physician Assistants and Nurse Practitioners) who all work together to provide you with the care you need, when you need it. You will need a follow up appointment in 12 months.  Please call our office 2 months in advance to schedule this appointment.  You may see Dr. Marlou Porch or one of the following Advanced Practice Providers on your designated Care Team:   Truitt Merle, NP Cecilie Kicks, NP . Kathyrn Drown, NP  Any Other Special Instructions Will Be Listed Below (If Applicable).

## 2018-12-22 NOTE — Progress Notes (Signed)
Virtual Visit via Telephone Note   This visit type was conducted due to national recommendations for restrictions regarding the COVID-19 Pandemic (e.g. social distancing) in an effort to limit this patient's exposure and mitigate transmission in our community.  Due to his co-morbid illnesses, this patient is at least at moderate risk for complications without adequate follow up.  This format is felt to be most appropriate for this patient at this time.  The patient did not have access to video technology/had technical difficulties with video requiring transitioning to audio format only (telephone).  All issues noted in this document were discussed and addressed.  No physical exam could be performed with this format.  Please refer to the patient's chart for his  consent to telehealth for [facility].   Date:  12/22/2018   ID:  Derek [name], DOB 1940-12-13, MRN [id number]  Patient Location: Home Provider Location: Home  PCP:  Sharilyn Sites, MD  Cardiologist:  Nelva Bush, MD  Electrophysiologist:  None   Evaluation Performed:  Follow-Up Visit  Chief Complaint: Atrial fibrillation follow-up and nonischemic cardiomyopathy  History of Present Illness:    Derek [name] is a 78 y.o. male with paroxysmal atrial fibrillation nonischemic cardiomyopathy here for follow-up.  Formally seen by Dr. Saunders Revel.  Also has a history of aortic root aneurysm followed by Dr. Servando Snare with a regurgitation.  Has Crohn's disease status post colectomy with colon cancer.  Remote PE.  Ischemic colitis 2018.  Chronic kidney disease monitored by nephrology.  Previously he would reported feeling occasional episodes where he needs to take a deep breath when in bed.  Occasional.  Sleeps on 4 pillows due to hiatal hernia.  Compliant with medications.  No bleeding on Eliquis.  Except for occasional once every 6 months small amount of hemorrhoidal bleeding.  Overall he has been doing quite well stable.  No  significant change in breathing.  No chest pain no syncope.  Taking his medications.  Hilma Favors has recently checked blood work on him.  He has started him on low-dose thyroid medication.    The patient does not have symptoms concerning for COVID-19 infection (fever, chills, cough, or new shortness of breath).    Past Medical History:  Diagnosis Date  . Allergy   . Anemia   . Anxiety   . Aortic insufficiency    a. mild-mod by echo 09/2015.  . Arthritis    "knees; left shoulder" (09/21/2013)  . Ascending aortic aneurysm (HCC)    a. last measurement 5.3 cm 03/2016 -> f/u planned 09/2015 to continue to follow.  . Atrial fibrillation ([place])   . B12 deficiency    takes Vit 12 shot every 14days   . Cataract   . CKD (chronic kidney disease) stage 3, GFR 30-59 ml/min (HCC) 08/22/2011  . Clotting disorder ([place])   . Crohn's disease ([place])   . Depression   . Enlarged prostate   . Enteric hyperoxaluria 02/21/2016  . GERD (gastroesophageal reflux disease)    takes Omeprazole daily  . Gout    takes Uloric and Colchicine daily  . Heart murmur   . Hepatitis C 1978   negtive RNA load - spontaneously cleared  . Hiatal hernia   . History of blood transfusion 1978; 1990's; ?   "w/bowel resection; S/P allupurinol; ?" (09/21/2013)  . History of colon polyps   . History of kidney stones   . History of MRSA infection 2010  . History of pulmonary embolism 2006   both legs  and both lungs [name] 08/26/2008 (09/21/2013)  . History of small bowel obstruction   . History of staph infection 1978  . Hyperoxaluria    Intestinal  . Hypertension   . Insomnia    takes Trazodone nightly  . Internal hemorrhoids   . LV dysfunction    a. h/o EF 45-50% in 2015, normalized on subsequent echoes.  . Nephrolithiasis   . Pancreatitis 2010   elevated lipase and amylase, stranding in tail of pancreas, ? from Humira  . Pancytopenia    Hx of  . Peripheral neuropathy    takes Gabapentin daily  . Pneumonia    hx of    . Post-traumatic stress syndrome    takes Paxil nightly  . PTSD (post-traumatic stress disorder)   . Pulmonary nodule    a. 68m by CT 05/2015, recommended f/u 6-12 months.  . RLS (restless legs syndrome)   . Rosacea conjunctivitis(372.31)    takes Minocin daily  . [place] 02/21/2016  . Sinus bradycardia   . Skin cancer    "cut/burned off left ear and face" (09/21/2013)  . [place]   . Thrombocytopenia ([place]    hx of   Past Surgical History:  Procedure Laterality Date  . ANKLE SURGERY Right   . APPENDECTOMY  1978  . BOWEL RESECTION  1978 X 2  . CARDIOVERSION N/A 05/29/2016   Procedure: CARDIOVERSION;  Surgeon: MSanda Klein MD;  Location: [facility];  Service: Cardiovascular;  Laterality: N/A;  . CHOLECYSTECTOMY    . Colon Cancer    . COLON RESECTION N/A 08/14/2016   Procedure: LAPAROSCOPIC RESECTION TRANSVERSE COLON;  Surgeon: DAlphonsa Overall MD;  Location: [facility];  Service: General;  Laterality: N/A;  . COLON SURGERY    . COLONOSCOPY    . ESOPHAGOGASTRODUODENOSCOPY    . EYE SURGERY     cataract surgery bilateral  . FOOT SURGERY Right    "took gout out"  . HEMICOLECTOMY Right   . ILEOCECETOMY  1978   /[name]10/03/2000  (09/21/2013)  . ILEOSTOMY    . ILEOSTOMY CLOSURE N/A 07/23/2017   Procedure: ILEOSTOMY REVERSAL ;  Surgeon: NAlphonsa Overall MD;  Location: [facility];  Service: General;  Laterality: N/A;  . INGUINAL HERNIA REPAIR Right   . IR FLUORO GUIDE CV LINE RIGHT  05/07/2017  . IR REMOVAL TUN CV CATH W/O FL  10/08/2017  . IR [place] ACCESS RIGHT  05/07/2017  . KNEE ARTHROSCOPY Left   . LAPAROTOMY N/A 08/27/2016   Procedure: EXPLORATORYLAPAROTOMY, LYSIS OF ADHESIONS, ILEOSTOMY, RIGHT COLECTOMY;  Surgeon: DAlphonsa Overall MD;  Location: [facility];  Service: General;  Laterality: N/A;  . LIGAMENT REPAIR Left   . POLYPECTOMY    . TEE WITHOUT CARDIOVERSION N/A 05/29/2016   Procedure: TRANSESOPHAGEAL ECHOCARDIOGRAM (TEE);  Surgeon:  MSanda Klein MD;  [place]  Service: Cardiovascular;  Laterality: N/A;  . TOTAL SHOULDER ARTHROPLASTY Left 09/21/2013  . TOTAL SHOULDER ARTHROPLASTY Left 09/21/2013   Procedure: LEFT TOTAL SHOULDER ARTHROPLASTY;  Surgeon: KMarin Shutter MD;  [place]  Service: Orthopedics;  Laterality: Left;     Current Meds  Medication Sig  . Acidophilus Lactobacillus CAPS Take 1 capsule by mouth daily.  .Marland Kitchenamiodarone (PACERONE) 200 MG tablet Take 1 tablet (200 mg total) by mouth daily.  . calcium carbonate (TUMS - DOSED IN MG ELEMENTAL CALCIUM) 500 MG chewable tablet Chew 2 tablets by mouth 2 (two) times daily.  . carboxymethylcellulose (REFRESH PLUS) 0.5 %  SOLN Place 1 drop into both eyes 4 (four) times daily.  . Cholecalciferol (VITAMIN D) 2000 units CAPS Take 2,000 Units by mouth daily.  . clindamycin (CLEOCIN T) 1 % external solution Apply 1 application topically daily as needed (head breakouts).  . cyanocobalamin (,VITAMIN B-12,) 1000 MCG/ML injection Inject 1,000 mcg into the muscle every 14 (fourteen) days.  Marland Kitchen ELIQUIS 5 MG TABS tablet Take 1 tablet (5 mg total) by mouth 2 (two) times daily.  . febuxostat (ULORIC) 40 MG tablet Take 40 mg by mouth daily.  . ferrous sulfate 325 (65 FE) MG tablet Take 325 mg by mouth daily with breakfast.  . gabapentin (NEURONTIN) 100 MG capsule Take one (1) capsules (100 mg) by mouth each morning and four (4) capsules (400 mg) by mouth each evening.  Marland Kitchen levothyroxine (SYNTHROID) 50 MCG tablet Take 50 mcg by mouth daily before breakfast.  . magnesium oxide (MAGNESIUM-OXIDE) 400 (241.3 Mg) MG tablet Take 800 mg by mouth 2 (two) times daily.   . minocycline (DYNACIN) 100 MG tablet Take 100 mg by mouth 2 (two) times daily as needed (face breakout).  . Multiple Vitamin (MULTIVITAMIN WITH MINERALS) TABS tablet Take 1 tablet by mouth daily.  Marland Kitchen omeprazole (PRILOSEC) 20 MG capsule Take 20 mg by mouth daily.    Marland Kitchen PARoxetine (PAXIL) 20 MG tablet Take 20 mg  by mouth at bedtime.  . polyethylene glycol powder (GLYCOLAX/MIRALAX) powder Take 1 Container by mouth once.  . sodium bicarbonate 650 MG tablet Take 650 mg by mouth 2 (two) times daily.  Marland Kitchen terazosin (HYTRIN) 2 MG capsule Take 2 mg by mouth at bedtime.  . traZODone (DESYREL) 100 MG tablet Take 100 mg by mouth at bedtime.   Current Facility-Administered Medications for the 12/22/18 encounter (Telemedicine) with Jerline Pain, MD  Medication  . 0.9 %  sodium chloride infusion     Allergies:   Lorazepam; Humira [adalimumab]; and Quinolones   Social History   Tobacco Use  . Smoking status: Former Smoker    Packs/day: 2.00    Years: 20.00    Pack years: 40.00    Types: Cigarettes    Last attempt to quit: 08/04/1975    Years since quitting: 43.4  . Smokeless tobacco: Former Systems developer    Types: Chew    Quit date: 08/03/1978  . Tobacco comment: 09/21/2013 "quit smoking in the late 1970's; stopped chewing couple years after I quit smoking"  Substance Use Topics  . Alcohol use: No  . Drug use: No     Family Hx: The patient's family history includes Aneurysm in his mother and sister; Hypertension in his father; Kidney disease in his father. There is no history of Esophageal cancer, Stomach cancer, Rectal cancer, or Colon cancer.  ROS:   Please see the history of present illness.    Denies any fevers chills nausea vomiting syncope bleeding All other systems reviewed and are negative.   Prior CV studies:   The following studies were reviewed today:  Cardiovascular Problems:  TAA  Aortic regurgitation  Non-ischemic cardiomyopathy  Paroxysmal atrial fibrillation  Remote pulmonary embolism  Risk Factors:  Hypertension, hyperlipidemia, male gender, and age > 49  Cath/PCI:  None  CV Surgery:  None  EP Procedures and Devices:  None  Non-Invasive Evaluation(s):  TTE (11/25/16): Normal LV size with LVEFof 55-60% with normal wall motion. Grade 1 diastolic dysfunction.  Moderate aortic root dilation (4.8 cm) with mild AI. Mild left atrial enlargement. Normal RV size and function.  MRA chest (10/29/16): Stable TAA measuring 5.3 cm at the sinuses of Valsalva and 4.4 cm above the sinotubular junction.  TEE (05/29/17): LVEF 45-50%. Mild to moderate AI. Moderately dilated aortic root. Dilated left atrium without thrombus. Dilated right atrium. No PFO.  TTE (05/27/17): Normal LV size with moderate LVH. LVEF 45-50%. Mild AI. Aortic root measures up to 5.1 cm. Mitral annular calcification. Normal RV size and function. Normal PA pressure.  TTE (09/06/15): Normal LV size with focal basal septal hypertrophy. LVEF 55-60% with grade 1 diastolic dysfunction. Mild to moderate AI with aortic root measuring up to 5.0 cm. Mild LA enlargement. Normal RV size and function.  Pharmacologic MPI (09/11/13): Normal apical thinning. No ischemia or scar. LVEF 50%.  Labs/Other Tests and Data Reviewed:    EKG:  An ECG dated 05/02/18 was personally reviewed today and demonstrated:  Normal sinus rhythm, left axis deviation 73 bpm normal intervals  Recent Labs: 05/02/2018: ALT 59; BUN 17; Creatinine, Ser 1.79; Hemoglobin 13.7; Platelets 120; Potassium 3.5; Sodium 143; TSH 4.090   Recent Lipid Panel Lab Results  Component Value Date/Time   CHOL 93 04/19/2015 02:03 PM   TRIG 143 09/07/2016 05:27 AM   HDL 31.60 (L) 04/19/2015 02:03 PM   CHOLHDL 3 04/19/2015 02:03 PM   LDLDIRECT 28.0 04/19/2015 02:03 PM    Wt Readings from Last 3 Encounters:  06/10/18 212 lb (96.2 kg)  05/02/18 208 lb (94.3 kg)  03/10/18 206 lb (93.4 kg)     Objective:    Vital Signs:  Ht 6' (1.829 m)   BMI 28.75 kg/m    VITAL SIGNS:  reviewed  Pleasant, alert, able to complete full sentences without difficulty on phone normal respiratory effort  ASSESSMENT & PLAN:    Paroxysmal atrial fibrillation - Doing well.  Most recent ECG shows sinus rhythm.  Previously asymptomatic when in A. fib.  Indefinite  anticoagulation with Eliquis.  He had trouble to maintain sinus rhythm without antiarrhythmic amiodarone in the past therefore we are continuing with that medication.  Lab work has been followed, Dr. Hilma Favors TSH - put him on synthroid.   Pulmonary follow-up as well.  Chronic anticoagulation  - Eliquis.  About once every 6 months he may see some spotting of blood in his stool, has hemorrhoids.  Otherwise he is doing quite well.  Nonischemic cardiomyopathy - Most recent echo showed normal EF with mild aortic regurgitation.  Doing well.  Thoracic aortic aneurysm - Dr. Servando Snare has been monitoring.  53 mm.  Good blood pressure control.  Hypothyroidism - Recently placed on Synthroid by Dr. Hilma Favors.  Amiodarone may be playing a role.  Continue with amiodarone.  CKD 3  - Dr. Jimmy Footman, seeing him next month.   COVID-19 Education: The signs and symptoms of COVID-19 were discussed with the patient and how to seek care for testing (follow up with PCP or arrange E-visit).  The importance of social distancing was discussed today.  Time:   Today, I have spent 12 minutes with the patient with telehealth technology discussing the above problems.     Medication Adjustments/Labs and Tests Ordered: Current medicines are reviewed at length with the patient today.  Concerns regarding medicines are outlined above.   Tests Ordered: No orders of the defined types were placed in this encounter.   Medication Changes: No orders of the defined types were placed in this encounter.   Disposition:  Follow up in 6 month(s)  Signed, Candee Furbish, MD  12/22/2018 9:24 AM  [facility]

## 2019-01-02 DIAGNOSIS — I719 Aortic aneurysm of unspecified site, without rupture: Secondary | ICD-10-CM | POA: Diagnosis not present

## 2019-01-02 DIAGNOSIS — N183 Chronic kidney disease, stage 3 (moderate): Secondary | ICD-10-CM | POA: Diagnosis not present

## 2019-01-02 DIAGNOSIS — C189 Malignant neoplasm of colon, unspecified: Secondary | ICD-10-CM | POA: Diagnosis not present

## 2019-01-02 DIAGNOSIS — R82992 Hyperoxaluria: Secondary | ICD-10-CM | POA: Diagnosis not present

## 2019-01-02 DIAGNOSIS — M109 Gout, unspecified: Secondary | ICD-10-CM | POA: Diagnosis not present

## 2019-01-02 DIAGNOSIS — N2581 Secondary hyperparathyroidism of renal origin: Secondary | ICD-10-CM | POA: Diagnosis not present

## 2019-01-02 DIAGNOSIS — N189 Chronic kidney disease, unspecified: Secondary | ICD-10-CM | POA: Diagnosis not present

## 2019-01-02 DIAGNOSIS — D631 Anemia in chronic kidney disease: Secondary | ICD-10-CM | POA: Diagnosis not present

## 2019-01-02 DIAGNOSIS — A439 Nocardiosis, unspecified: Secondary | ICD-10-CM | POA: Diagnosis not present

## 2019-01-02 DIAGNOSIS — N200 Calculus of kidney: Secondary | ICD-10-CM | POA: Diagnosis not present

## 2019-01-02 DIAGNOSIS — E538 Deficiency of other specified B group vitamins: Secondary | ICD-10-CM | POA: Diagnosis not present

## 2019-01-02 DIAGNOSIS — K559 Vascular disorder of intestine, unspecified: Secondary | ICD-10-CM | POA: Diagnosis not present

## 2019-01-02 DIAGNOSIS — E039 Hypothyroidism, unspecified: Secondary | ICD-10-CM | POA: Diagnosis not present

## 2019-01-02 DIAGNOSIS — I129 Hypertensive chronic kidney disease with stage 1 through stage 4 chronic kidney disease, or unspecified chronic kidney disease: Secondary | ICD-10-CM | POA: Diagnosis not present

## 2019-02-07 DIAGNOSIS — D485 Neoplasm of uncertain behavior of skin: Secondary | ICD-10-CM | POA: Diagnosis not present

## 2019-02-07 DIAGNOSIS — L821 Other seborrheic keratosis: Secondary | ICD-10-CM | POA: Diagnosis not present

## 2019-02-07 DIAGNOSIS — L718 Other rosacea: Secondary | ICD-10-CM | POA: Diagnosis not present

## 2019-02-07 DIAGNOSIS — Z85828 Personal history of other malignant neoplasm of skin: Secondary | ICD-10-CM | POA: Diagnosis not present

## 2019-02-19 ENCOUNTER — Other Ambulatory Visit: Payer: Self-pay | Admitting: Cardiothoracic Surgery

## 2019-02-19 DIAGNOSIS — I712 Thoracic aortic aneurysm, without rupture, unspecified: Secondary | ICD-10-CM

## 2019-02-21 ENCOUNTER — Other Ambulatory Visit (HOSPITAL_COMMUNITY): Payer: Self-pay | Admitting: Internal Medicine

## 2019-02-21 ENCOUNTER — Other Ambulatory Visit: Payer: Self-pay

## 2019-02-21 ENCOUNTER — Ambulatory Visit (HOSPITAL_COMMUNITY)
Admission: RE | Admit: 2019-02-21 | Discharge: 2019-02-21 | Disposition: A | Discharge status: Home / Self Care | Payer: Medicare Other | Source: Ambulatory Visit | Attending: Internal Medicine | Admitting: Internal Medicine

## 2019-02-21 DIAGNOSIS — S299XXA Unspecified injury of thorax, initial encounter: Secondary | ICD-10-CM

## 2019-02-21 DIAGNOSIS — D6949 Other primary thrombocytopenia: Secondary | ICD-10-CM | POA: Diagnosis not present

## 2019-02-21 DIAGNOSIS — A419 Sepsis, unspecified organism: Secondary | ICD-10-CM | POA: Diagnosis not present

## 2019-02-21 DIAGNOSIS — S2231XA Fracture of one rib, right side, initial encounter for closed fracture: Secondary | ICD-10-CM | POA: Diagnosis not present

## 2019-02-21 DIAGNOSIS — Z6827 Body mass index (BMI) 27.0-27.9, adult: Secondary | ICD-10-CM | POA: Diagnosis not present

## 2019-02-21 DIAGNOSIS — Z1389 Encounter for screening for other disorder: Secondary | ICD-10-CM | POA: Diagnosis not present

## 2019-02-21 DIAGNOSIS — R079 Chest pain, unspecified: Secondary | ICD-10-CM | POA: Diagnosis not present

## 2019-03-12 ENCOUNTER — Telehealth: Payer: Self-pay | Admitting: Physician Assistant

## 2019-03-12 ENCOUNTER — Emergency Department (HOSPITAL_COMMUNITY): Payer: No Typology Code available for payment source

## 2019-03-12 ENCOUNTER — Other Ambulatory Visit: Payer: Self-pay

## 2019-03-12 ENCOUNTER — Encounter (HOSPITAL_COMMUNITY): Payer: Self-pay

## 2019-03-12 ENCOUNTER — Inpatient Hospital Stay (HOSPITAL_COMMUNITY): Payer: No Typology Code available for payment source

## 2019-03-12 ENCOUNTER — Inpatient Hospital Stay (HOSPITAL_COMMUNITY)
Admission: EM | Admit: 2019-03-12 | Discharge: 2019-03-14 | DRG: 389 | Disposition: A | Discharge status: Home / Self Care | Payer: No Typology Code available for payment source | Attending: Internal Medicine | Admitting: Internal Medicine

## 2019-03-12 DIAGNOSIS — R001 Bradycardia, unspecified: Secondary | ICD-10-CM | POA: Diagnosis present

## 2019-03-12 DIAGNOSIS — N200 Calculus of kidney: Secondary | ICD-10-CM | POA: Diagnosis present

## 2019-03-12 DIAGNOSIS — Z96612 Presence of left artificial shoulder joint: Secondary | ICD-10-CM | POA: Diagnosis present

## 2019-03-12 DIAGNOSIS — N183 Chronic kidney disease, stage 3 unspecified: Secondary | ICD-10-CM | POA: Diagnosis present

## 2019-03-12 DIAGNOSIS — Z79899 Other long term (current) drug therapy: Secondary | ICD-10-CM

## 2019-03-12 DIAGNOSIS — Z8249 Family history of ischemic heart disease and other diseases of the circulatory system: Secondary | ICD-10-CM

## 2019-03-12 DIAGNOSIS — I2510 Atherosclerotic heart disease of native coronary artery without angina pectoris: Secondary | ICD-10-CM | POA: Diagnosis present

## 2019-03-12 DIAGNOSIS — G2581 Restless legs syndrome: Secondary | ICD-10-CM | POA: Diagnosis present

## 2019-03-12 DIAGNOSIS — Z8619 Personal history of other infectious and parasitic diseases: Secondary | ICD-10-CM

## 2019-03-12 DIAGNOSIS — Z7901 Long term (current) use of anticoagulants: Secondary | ICD-10-CM

## 2019-03-12 DIAGNOSIS — Z86711 Personal history of pulmonary embolism: Secondary | ICD-10-CM | POA: Diagnosis not present

## 2019-03-12 DIAGNOSIS — K565 Intestinal adhesions [bands], unspecified as to partial versus complete obstruction: Secondary | ICD-10-CM | POA: Diagnosis present

## 2019-03-12 DIAGNOSIS — K5669 Other partial intestinal obstruction: Secondary | ICD-10-CM | POA: Diagnosis not present

## 2019-03-12 DIAGNOSIS — F4310 Post-traumatic stress disorder, unspecified: Secondary | ICD-10-CM | POA: Diagnosis present

## 2019-03-12 DIAGNOSIS — K5010 Crohn's disease of large intestine without complications: Secondary | ICD-10-CM | POA: Diagnosis present

## 2019-03-12 DIAGNOSIS — F419 Anxiety disorder, unspecified: Secondary | ICD-10-CM | POA: Diagnosis present

## 2019-03-12 DIAGNOSIS — H10829 Rosacea conjunctivitis, unspecified eye: Secondary | ICD-10-CM | POA: Diagnosis present

## 2019-03-12 DIAGNOSIS — I7121 Aneurysm of the ascending aorta, without rupture: Secondary | ICD-10-CM | POA: Diagnosis present

## 2019-03-12 DIAGNOSIS — I712 Thoracic aortic aneurysm, without rupture: Secondary | ICD-10-CM | POA: Diagnosis present

## 2019-03-12 DIAGNOSIS — N179 Acute kidney failure, unspecified: Secondary | ICD-10-CM | POA: Diagnosis present

## 2019-03-12 DIAGNOSIS — N1832 Chronic kidney disease, stage 3b: Secondary | ICD-10-CM | POA: Diagnosis present

## 2019-03-12 DIAGNOSIS — Z20828 Contact with and (suspected) exposure to other viral communicable diseases: Secondary | ICD-10-CM | POA: Diagnosis present

## 2019-03-12 DIAGNOSIS — I10 Essential (primary) hypertension: Secondary | ICD-10-CM | POA: Diagnosis present

## 2019-03-12 DIAGNOSIS — K56699 Other intestinal obstruction unspecified as to partial versus complete obstruction: Secondary | ICD-10-CM | POA: Diagnosis not present

## 2019-03-12 DIAGNOSIS — G629 Polyneuropathy, unspecified: Secondary | ICD-10-CM | POA: Diagnosis present

## 2019-03-12 DIAGNOSIS — I129 Hypertensive chronic kidney disease with stage 1 through stage 4 chronic kidney disease, or unspecified chronic kidney disease: Secondary | ICD-10-CM | POA: Diagnosis present

## 2019-03-12 DIAGNOSIS — R7989 Other specified abnormal findings of blood chemistry: Secondary | ICD-10-CM | POA: Diagnosis present

## 2019-03-12 DIAGNOSIS — I480 Paroxysmal atrial fibrillation: Secondary | ICD-10-CM | POA: Diagnosis present

## 2019-03-12 DIAGNOSIS — Z841 Family history of disorders of kidney and ureter: Secondary | ICD-10-CM | POA: Diagnosis not present

## 2019-03-12 DIAGNOSIS — Z9049 Acquired absence of other specified parts of digestive tract: Secondary | ICD-10-CM

## 2019-03-12 DIAGNOSIS — K56609 Unspecified intestinal obstruction, unspecified as to partial versus complete obstruction: Secondary | ICD-10-CM | POA: Diagnosis present

## 2019-03-12 DIAGNOSIS — Z87891 Personal history of nicotine dependence: Secondary | ICD-10-CM | POA: Diagnosis not present

## 2019-03-12 DIAGNOSIS — Z888 Allergy status to other drugs, medicaments and biological substances status: Secondary | ICD-10-CM

## 2019-03-12 DIAGNOSIS — K50119 Crohn's disease of large intestine with unspecified complications: Secondary | ICD-10-CM | POA: Diagnosis present

## 2019-03-12 DIAGNOSIS — Z0189 Encounter for other specified special examinations: Secondary | ICD-10-CM

## 2019-03-12 DIAGNOSIS — Z79890 Hormone replacement therapy (postmenopausal): Secondary | ICD-10-CM

## 2019-03-12 DIAGNOSIS — Z85038 Personal history of other malignant neoplasm of large intestine: Secondary | ICD-10-CM | POA: Diagnosis not present

## 2019-03-12 DIAGNOSIS — N184 Chronic kidney disease, stage 4 (severe): Secondary | ICD-10-CM | POA: Diagnosis present

## 2019-03-12 LAB — CBC
HCT: 53.9 % — ABNORMAL HIGH (ref 39.0–52.0)
Hemoglobin: 17.4 g/dL — ABNORMAL HIGH (ref 13.0–17.0)
MCH: 29.4 pg (ref 26.0–34.0)
MCHC: 32.3 g/dL (ref 30.0–36.0)
MCV: 91.2 fL (ref 80.0–100.0)
Platelets: 170 10*3/uL (ref 150–400)
RBC: 5.91 MIL/uL — ABNORMAL HIGH (ref 4.22–5.81)
RDW: 14.5 % (ref 11.5–15.5)
WBC: 11.9 10*3/uL — ABNORMAL HIGH (ref 4.0–10.5)
nRBC: 0 % (ref 0.0–0.2)

## 2019-03-12 LAB — APTT: aPTT: 35 seconds (ref 24–36)

## 2019-03-12 LAB — COMPREHENSIVE METABOLIC PANEL
ALT: 127 U/L — ABNORMAL HIGH (ref 0–44)
AST: 110 U/L — ABNORMAL HIGH (ref 15–41)
Albumin: 5.2 g/dL — ABNORMAL HIGH (ref 3.5–5.0)
Alkaline Phosphatase: 195 U/L — ABNORMAL HIGH (ref 38–126)
Anion gap: 19 — ABNORMAL HIGH (ref 5–15)
BUN: 29 mg/dL — ABNORMAL HIGH (ref 8–23)
CO2: 36 mmol/L — ABNORMAL HIGH (ref 22–32)
Calcium: 10.5 mg/dL — ABNORMAL HIGH (ref 8.9–10.3)
Chloride: 91 mmol/L — ABNORMAL LOW (ref 98–111)
Creatinine, Ser: 2.98 mg/dL — ABNORMAL HIGH (ref 0.61–1.24)
GFR calc Af Amer: 23 mL/min — ABNORMAL LOW (ref >60)
GFR calc non Af Amer: 19 mL/min — ABNORMAL LOW (ref >60)
Glucose, Bld: 134 mg/dL — ABNORMAL HIGH (ref 70–99)
Potassium: 4.6 mmol/L (ref 3.5–5.1)
Sodium: 146 mmol/L — ABNORMAL HIGH (ref 135–145)
Total Bilirubin: 1.8 mg/dL — ABNORMAL HIGH (ref 0.3–1.2)
Total Protein: 9 g/dL — ABNORMAL HIGH (ref 6.5–8.1)

## 2019-03-12 LAB — HEPARIN LEVEL (UNFRACTIONATED): Heparin Unfractionated: >2.2 IU/mL — ABNORMAL HIGH (ref 0.30–0.70)

## 2019-03-12 LAB — SARS CORONAVIRUS 2 BY RT PCR (HOSPITAL ORDER, PERFORMED IN ~~LOC~~ HOSPITAL LAB): SARS Coronavirus 2: NEGATIVE

## 2019-03-12 LAB — LIPASE, BLOOD: Lipase: 44 U/L (ref 11–51)

## 2019-03-12 MED ORDER — MORPHINE SULFATE (PF) 4 MG/ML IV SOLN
4.0000 mg | Freq: Once | INTRAVENOUS | Status: AC
  Administered 2019-03-12: 4 mg via INTRAVENOUS
  Filled 2019-03-12: qty 1

## 2019-03-12 MED ORDER — HEPARIN SODIUM (PORCINE) 5000 UNIT/ML IJ SOLN: 5000.0000 [IU] | Freq: Three times a day (TID) | INTRAMUSCULAR | Status: DC

## 2019-03-12 MED ORDER — SODIUM CHLORIDE 0.9 % IV SOLN
INTRAVENOUS | Status: DC
  Administered 2019-03-12 – 2019-03-14 (×6): via INTRAVENOUS

## 2019-03-12 MED ORDER — DIATRIZOATE MEGLUMINE & SODIUM 66-10 % PO SOLN
90.0000 mL | Freq: Once | ORAL | Status: AC
  Administered 2019-03-12: 90 mL via NASOGASTRIC
  Filled 2019-03-12: qty 90

## 2019-03-12 MED ORDER — SODIUM CHLORIDE 0.9% FLUSH: 3.0000 mL | Freq: Once | INTRAVENOUS | Status: DC

## 2019-03-12 MED ORDER — SODIUM CHLORIDE 0.9% FLUSH
3.0000 mL | Freq: Two times a day (BID) | INTRAVENOUS | Status: DC
  Administered 2019-03-12 – 2019-03-14 (×3): 3 mL via INTRAVENOUS

## 2019-03-12 MED ORDER — METOPROLOL TARTRATE 5 MG/5ML IV SOLN
2.5000 mg | Freq: Four times a day (QID) | INTRAVENOUS | Status: DC
  Administered 2019-03-12 – 2019-03-14 (×6): 2.5 mg via INTRAVENOUS
  Filled 2019-03-12 (×6): qty 5

## 2019-03-12 MED ORDER — HEPARIN (PORCINE) 25000 UT/250ML-% IV SOLN
1200.0000 [IU]/h | INTRAVENOUS | Status: DC
  Administered 2019-03-12: 1200 [IU]/h via INTRAVENOUS
  Filled 2019-03-12: qty 250

## 2019-03-12 MED ORDER — ONDANSETRON HCL 4 MG/2ML IJ SOLN
4.0000 mg | Freq: Three times a day (TID) | INTRAMUSCULAR | Status: DC | PRN
  Administered 2019-03-12 – 2019-03-13 (×2): 4 mg via INTRAVENOUS
  Filled 2019-03-12 (×2): qty 2

## 2019-03-12 MED ORDER — SODIUM CHLORIDE 0.9 % IV BOLUS
500.0000 mL | Freq: Once | INTRAVENOUS | Status: AC
  Administered 2019-03-12: 500 mL via INTRAVENOUS

## 2019-03-12 MED ORDER — MORPHINE SULFATE (PF) 2 MG/ML IV SOLN
1.0000 mg | INTRAVENOUS | Status: DC | PRN
  Administered 2019-03-12 – 2019-03-13 (×3): 2 mg via INTRAVENOUS
  Filled 2019-03-12 (×3): qty 1

## 2019-03-12 MED ORDER — PROMETHAZINE HCL 25 MG RE SUPP
12.5000 mg | Freq: Four times a day (QID) | RECTAL | Status: DC | PRN
  Administered 2019-03-13: 12.5 mg via RECTAL
  Filled 2019-03-12 (×2): qty 1

## 2019-03-12 MED ORDER — ONDANSETRON HCL 4 MG/2ML IJ SOLN
4.0000 mg | Freq: Once | INTRAMUSCULAR | Status: AC
  Administered 2019-03-12: 4 mg via INTRAVENOUS
  Filled 2019-03-12: qty 2

## 2019-03-12 NOTE — ED Triage Notes (Signed)
Patient states he has a blockage.   C/o Generalized abdominal pain, bloating, and N/V.  5/10 tightness/pressure pain.   5 occurences of emesis.  Last BM yesterday and watery.   Hx. Blockage 2019   A/ox4 Ambulatory in triage.

## 2019-03-12 NOTE — Telephone Encounter (Signed)
Agree with advice to send to the ER for presumed SBO. Will need evaluation, imaging, and likely surgical consultation.

## 2019-03-12 NOTE — Progress Notes (Signed)
ANTICOAGULATION CONSULT NOTE - Initial Consult  Pharmacy Consult for heparin drip Indication: atrial fibrillation  Allergies  Allergen Reactions  . Lorazepam Other (See Comments)    Reaction:  Hallucinations   . Humira [Adalimumab] Other (See Comments)    Pt states that he got pancreatitis.    . Quinolones     Patient was warned about not using Cipro and similar antibiotics. Recent studies have raised concern that fluoroquinolone antibiotics could be associated with an increased risk of aortic aneurysm Fluoroquinolones have non-antimicrobial properties that might jeopardise the integrity of the extracellular matrix of the vascular wall In a  propensity score matched cohort study in [place], there was a 66% increased rate of aortic aneurysm or dissection associated with oral fluoroquinolone use, compared wit    Patient Measurements: Weight: 192 lb (87.1 kg) Heparin Dosing Weight:   Vital Signs: Temp: 97.5 F (36.4 C) (08/09 1145) Temp Source: Oral (08/09 1145) BP: 123/85 (08/09 1800) Pulse Rate: 95 (08/09 1800)  Labs: Recent Labs    03/12/19 1152  HGB 17.4*  HCT 53.9*  PLT 170  CREATININE 2.98*    Estimated Creatinine Clearance: 23.1 mL/min (A) (by C-G formula based on SCr of 2.98 mg/dL (H)).   Medical History: Past Medical History:  Diagnosis Date  . Allergy   . Anemia   . Anxiety   . Aortic insufficiency    a. mild-mod by echo 09/2015.  . Arthritis    "knees; left shoulder" (09/21/2013)  . Ascending aortic aneurysm (HCC)    a. last measurement 5.3 cm 03/2016 -> f/u planned 09/2015 to continue to follow.  . Atrial fibrillation ([place])   . B12 deficiency    takes Vit 12 shot every 14days   . Cataract   . CKD (chronic kidney disease) stage 3, GFR 30-59 ml/min (HCC) 08/22/2011  . Clotting disorder ([place])   . Crohn's disease ([place])   . Depression   . Enlarged prostate   . Enteric hyperoxaluria 02/21/2016  . GERD (gastroesophageal reflux disease)    takes Omeprazole  daily  . Gout    takes Uloric and Colchicine daily  . Heart murmur   . Hepatitis C 1978   negtive RNA load - spontaneously cleared  . Hiatal hernia   . History of blood transfusion 1978; 1990's; ?   "w/bowel resection; S/P allupurinol; ?" (09/21/2013)  . History of colon polyps   . History of kidney stones   . History of MRSA infection 2010  . History of pulmonary embolism 2006   both legs and both lungs /notes 08/26/2008 (09/21/2013)  . History of small bowel obstruction   . History of staph infection 1978  . Hyperoxaluria    Intestinal  . Hypertension   . Insomnia    takes Trazodone nightly  . Internal hemorrhoids   . LV dysfunction    a. h/o EF 45-50% in 2015, normalized on subsequent echoes.  . Nephrolithiasis   . Pancreatitis 2010   elevated lipase and amylase, stranding in tail of pancreas, ? from Humira  . Pancytopenia    Hx of  . Peripheral neuropathy    takes Gabapentin daily  . Pneumonia    hx of   . Post-traumatic stress syndrome    takes Paxil nightly  . PTSD (post-traumatic stress disorder)   . Pulmonary nodule    a. 62m by CT 05/2015, recommended f/u 6-12 months.  . RLS (restless legs syndrome)   . Rosacea conjunctivitis(372.31)    takes Minocin daily  .  Secondary hyperparathyroidism ([place]) 02/21/2016  . Sinus bradycardia   . Skin cancer    "cut/burned off left ear and face" (09/21/2013)  . Small bowel obstruction ([place])   . Thrombocytopenia (HCC)    hx of      Assessment: 78 y.o. male with medical history significant of Crohn's disease, colon CA s/p resection. Pt reports that last night he started having significant nausea. He vomited several times.  Takes eliquis for AFib, last dose 8/8 @ 1900.  Pharmacy consulted to heparin drip  03/12/2019 CBC WNL Scr 2.98 Obtain stat HL and aPTT  Goal of Therapy:  Heparin level 0.3-0.7 units/ml APTT 66-102 seconds Monitor platelets by anticoagulation protocol:   Plan:  Start heparin drip at 1200 units/hr (No  bolus) APTT in 6 hours Daily CBC  Dolly Rias RPh 03/12/2019, 7:00 PM Pager [phone]
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
## 2019-03-12 NOTE — ED Provider Notes (Signed)
[facility] DEPT Provider Note   CSN: [id number] Arrival date & time: 03/12/19  1137    History   Chief Complaint Chief Complaint  Patient presents with   Abdominal Pain   Nausea   Possible Blockage    HPI SUHAAS AGENA is a 78 y.o. male.     HPI Patient presents thinking he has a bowel obstruction.  History of abdominal surgeries due to colon cancer.  Has had previous bowel obstructions also.  States that yesterday he felt as if he began to have a blockage.  Abdomen became more swollen and painful.  Has chronic diarrhea with states since yesterday no bowel movement or passing gas.  Has had nausea and vomiting.  States however the pain and swelling his abdomen is somewhat better.  No fevers. Past Medical History:  Diagnosis Date   Allergy    Anemia    Anxiety    Aortic insufficiency    a. mild-mod by echo 09/2015.   Arthritis    "knees; left shoulder" (09/21/2013)   Ascending aortic aneurysm (HCC)    a. last measurement 5.3 cm 03/2016 -> f/u planned 09/2015 to continue to follow.   Atrial fibrillation ([place])    B12 deficiency    takes Vit 12 shot every 14days    Cataract    CKD (chronic kidney disease) stage 3, GFR 30-59 ml/min (HCC) 08/22/2011   Clotting disorder ([place])    Crohn's disease ([place])    Depression    Enlarged prostate    Enteric hyperoxaluria 02/21/2016   GERD (gastroesophageal reflux disease)    takes Omeprazole daily   Gout    takes Uloric and Colchicine daily   Heart murmur    Hepatitis C 1978   negtive RNA load - spontaneously cleared   Hiatal hernia    History of blood transfusion 1978; 1990's; ?   "w/bowel resection; S/P allupurinol; ?" (09/21/2013)   History of colon polyps    History of kidney stones    History of MRSA infection 2010   History of pulmonary embolism 2006   both legs and both lungs /notes 08/26/2008 (09/21/2013)   History of small bowel obstruction    History of staph  infection 1978   Hyperoxaluria    Intestinal   Hypertension    Insomnia    takes Trazodone nightly   Internal hemorrhoids    LV dysfunction    a. h/o EF 45-50% in 2015, normalized on subsequent echoes.   Nephrolithiasis    Pancreatitis 2010   elevated lipase and amylase, stranding in tail of pancreas, ? from Humira   Pancytopenia    Hx of   Peripheral neuropathy    takes Gabapentin daily   Pneumonia    hx of    Post-traumatic stress syndrome    takes Paxil nightly   PTSD (post-traumatic stress disorder)    Pulmonary nodule    a. 40m by CT 05/2015, recommended f/u 6-12 months.   RLS (restless legs syndrome)    Rosacea conjunctivitis(372.31)    takes Minocin daily   Secondary hyperparathyroidism ([place] 02/21/2016   Sinus bradycardia    Skin cancer    "cut/burned off left ear and face" (09/21/2013)   Small bowel obstruction (HCC)    Thrombocytopenia (HCC)    hx of    Patient Active Problem List   Diagnosis Date Noted   Osteoarthritis of left knee 08/23/2018   History of gout 08/23/2018   Thoracic aortic aneurysm without rupture ([place] 10/26/2017  Status post reversal of ileostomy 07/23/2017   High output ileostomy ([place]) 06/02/2017   Nocardia infection    Malignant neoplasm of transverse colon ([place])    Crohn's disease of colon with complication (HCC)    Partial small bowel obstruction ([place]) 05/09/2017   Anorexia 02/04/2017   Aortic valve regurgitation 11/05/2016   Bowel perforation ([place]) 08/27/2016   Colonic ischemia (HCC) 08/27/2016   Pain of upper abdomen    Chronic anticoagulation 08/18/2016   Sinus bradycardia 08/17/2016   S/P colon resection 08/14/2016   Adenomatous polyp of transverse colon s/p partial colectomy 08/14/2016 08/14/2016   Infectious diarrhea    Chronic diarrhea    Essential hypertension 05/27/2016   Hypotension 05/27/2016   Hypomagnesemia 05/27/2016   Peripheral neuropathic pain 05/26/2016   GERD  (gastroesophageal reflux disease) 05/26/2016   Paroxysmal atrial fibrillation ([place]) 05/26/2016   Lung nodule 05/12/2016   SBO (small bowel obstruction) ([place]) 05/11/2016   Enteric hyperoxaluria 02/21/2016   Secondary hyperparathyroidism ([place]) 02/21/2016   Pain in joint, shoulder region 10/12/2013   Decreased range of motion of left shoulder 10/12/2013   Muscle weakness (generalized) 10/12/2013   Restriction of joint motion 10/12/2013   S/P shoulder replacement 09/21/2013   Nonischemic cardiomyopathy ([place]) 09/07/2013   Anxiety 08/22/2011   CKD (chronic kidney disease) stage 3, GFR 30-59 ml/min (HCC) 08/22/2011   Thrombocytopenia ([place]) 08/22/2011   CORONARY ATHEROSCLEROSIS NATIVE CORONARY ARTERY 06/25/2010   Ascending aortic aneurysm ([place]) 06/26/2009   Crohn's ileocolitis ([place]) 06/26/2009   B12 DEFICIENCY 05/09/2008   Gout 05/09/2008   POST TRAUMATIC STRESS SYNDROME 05/09/2008   GERD 05/09/2008   HEPATITIS C Ab positive RNA neg, HX OF 05/09/2008   PULMONARY EMBOLISM, HX OF 05/09/2008   GASTRITIS, HX OF 05/09/2008    Past Surgical History:  Procedure Laterality Date   ANKLE SURGERY Right    [place] X 2   CARDIOVERSION N/A 05/29/2016   Procedure: CARDIOVERSION;  Surgeon: Sanda Klein, MD;  Location: [facility];  Service: Cardiovascular;  Laterality: N/A;   CHOLECYSTECTOMY     Colon Cancer     COLON RESECTION N/A 08/14/2016   Procedure: LAPAROSCOPIC RESECTION TRANSVERSE COLON;  Surgeon: [name], MD;  Location: [facility];  Service: General;  Laterality: N/A;   COLON SURGERY     COLONOSCOPY     ESOPHAGOGASTRODUODENOSCOPY     EYE SURGERY     cataract surgery bilateral   FOOT SURGERY Right    "took gout out"   HEMICOLECTOMY Right    ILEOCECETOMY  1978   [name] 05/10/2000  (09/21/2013)   ILEOSTOMY     ILEOSTOMY CLOSURE N/A 07/23/2017   Procedure: ILEOSTOMY REVERSAL ;  Surgeon: [name], MD;   Location: [facility];  Service: General;  Laterality: N/A;   INGUINAL HERNIA REPAIR Right    IR FLUORO GUIDE CV LINE RIGHT  05/07/2017   IR REMOVAL TUN CV CATH W/O FL  10/08/2017   IR US GUIDE VASC ACCESS RIGHT  05/07/2017   KNEE ARTHROSCOPY Left    LAPAROTOMY N/A 08/27/2016   Procedure: EXPLORATORYLAPAROTOMY, LYSIS OF ADHESIONS, ILEOSTOMY, RIGHT COLECTOMY;  Surgeon: [name], MD;  Location: [facility];  Service: General;  Laterality: N/A;   LIGAMENT REPAIR Left    POLYPECTOMY     TEE WITHOUT CARDIOVERSION N/A 05/29/2016   Procedure: TRANSESOPHAGEAL ECHOCARDIOGRAM (TEE);  Surgeon: Sanda Klein, MD;  Location: [facility];  Service: Cardiovascular;  Laterality: N/A;   TOTAL SHOULDER ARTHROPLASTY Left 09/21/2013  TOTAL SHOULDER ARTHROPLASTY Left 09/21/2013   Procedure: LEFT TOTAL SHOULDER ARTHROPLASTY;  Surgeon: Marin Shutter, MD;  Location: [facility];  Service: Orthopedics;  Laterality: Left;        Home Medications    Prior to Admission medications   Medication Sig Start Date End Date Taking? Authorizing Provider  Acidophilus Lactobacillus CAPS Take 1 capsule by mouth daily. 06/08/16   [name], MD  amiodarone (PACERONE) 200 MG tablet Take 1 tablet (200 mg total) by mouth daily. 11/05/16   End, Harrell Gave, MD  calcium carbonate (TUMS - DOSED IN MG ELEMENTAL CALCIUM) 500 MG chewable tablet Chew 2 tablets by mouth 2 (two) times daily.    [provider]  carboxymethylcellulose (REFRESH PLUS) 0.5 % SOLN Place 1 drop into both eyes 4 (four) times daily.    [provider]  Cholecalciferol (VITAMIN D) 2000 units CAPS Take 2,000 Units by mouth daily.    [provider]  clindamycin (CLEOCIN T) 1 % external solution Apply 1 application topically daily as needed (head breakouts).    [provider]  cyanocobalamin (,VITAMIN B-12,) 1000 MCG/ML injection Inject 1,000 mcg into the muscle every 14 (fourteen) days.    [provider]  ELIQUIS 5 MG  TABS tablet Take 1 tablet (5 mg total) by mouth 2 (two) times daily. 11/05/16   End, Harrell Gave, MD  febuxostat (ULORIC) 40 MG tablet Take 40 mg by mouth daily.    [provider]  ferrous sulfate 325 (65 FE) MG tablet Take 325 mg by mouth daily with breakfast.    [provider]  gabapentin (NEURONTIN) 100 MG capsule Take one (1) capsules (100 mg) by mouth each morning and four (4) capsules (400 mg) by mouth each evening.    [provider]  levothyroxine (SYNTHROID) 50 MCG tablet Take 50 mcg by mouth daily before breakfast.    [provider]  magnesium oxide (MAGNESIUM-OXIDE) 400 (241.3 Mg) MG tablet Take 800 mg by mouth 2 (two) times daily.     [provider]  minocycline (DYNACIN) 100 MG tablet Take 100 mg by mouth 2 (two) times daily as needed (face breakout).    [provider]  Multiple Vitamin (MULTIVITAMIN WITH MINERALS) TABS tablet Take 1 tablet by mouth daily.    [provider]  omeprazole (PRILOSEC) 20 MG capsule Take 20 mg by mouth daily.      [provider]  PARoxetine (PAXIL) 20 MG tablet Take 20 mg by mouth at bedtime.    [provider]  polyethylene glycol powder (GLYCOLAX/MIRALAX) powder Take 1 Container by mouth once.    [provider]  sodium bicarbonate 650 MG tablet Take 650 mg by mouth 2 (two) times daily.    [provider]  terazosin (HYTRIN) 2 MG capsule Take 2 mg by mouth at bedtime.    [provider]  traZODone (DESYREL) 100 MG tablet Take 100 mg by mouth at bedtime.    [provider]  Calcium Carbonate (CALCIUM 500 PO) Take 2 tablets by mouth 2 (two) times daily.   05/11/16  [provider]    Family History Family History  Problem Relation Age of Onset   Kidney disease Father    Hypertension Father    Aneurysm Mother    Aneurysm Sister    Esophageal cancer Neg Hx    Stomach cancer Neg Hx    Rectal cancer Neg Hx    Colon  cancer Neg Hx  Social History Social History   Tobacco Use   Smoking status: Former Smoker    Packs/day: 2.00    Years: 20.00    Pack years: 40.00    Types: Cigarettes    Quit date: 08/04/1975    Years since quitting: 43.6   Smokeless tobacco: Former Systems developer    Types: [place] date: 08/03/1978   Tobacco comment: 09/21/2013 "quit smoking in the late 1970's; stopped chewing couple years after I quit smoking"  Substance Use Topics   Alcohol use: No   Drug use: No     Allergies   Lorazepam, Humira [adalimumab], and Quinolones   Review of Systems Review of Systems  Constitutional: Negative for appetite change.  HENT: Negative for congestion.   Respiratory: Negative for shortness of breath.   Cardiovascular: Negative for chest pain.  Gastrointestinal: Positive for abdominal pain, nausea and vomiting.  Genitourinary: Negative for flank pain.  Musculoskeletal: Negative for back pain.  Skin: Negative for rash.  Neurological: Negative for weakness.     Physical Exam Updated Vital Signs BP 137/83    Pulse 86    Temp (!) 97.5 F (36.4 C) (Oral)    Resp 18    SpO2 97%   Physical Exam Vitals signs and nursing note reviewed.  HENT:     Head: Normocephalic.  Cardiovascular:     Rate and Rhythm: Normal rate and regular rhythm.  Pulmonary:     Breath sounds: No wheezing or rhonchi.  Abdominal:     Comments: Some diffuse abdominal distention.  Reducible ventral hernia.  Skin:    Capillary Refill: Capillary refill takes less than 2 seconds.  Neurological:     Mental Status: He is alert and oriented to person, place, and time.      ED Treatments / Results  Labs (all labs ordered are listed, but only abnormal results are displayed) Labs Reviewed  COMPREHENSIVE METABOLIC PANEL - Abnormal; Notable for the following components:      Result Value   Sodium 146 (*)    Chloride 91 (*)    CO2 36 (*)    Glucose, Bld 134 (*)    BUN 29 (*)    Creatinine, Ser 2.98 (*)     Calcium 10.5 (*)    Total Protein 9.0 (*)    Albumin 5.2 (*)    AST 110 (*)    ALT 127 (*)    Alkaline Phosphatase 195 (*)    Total Bilirubin 1.8 (*)    GFR calc non Af Amer 19 (*)    GFR calc Af Amer 23 (*)    Anion gap 19 (*)    All other components within normal limits  CBC - Abnormal; Notable for the following components:   WBC 11.9 (*)    RBC 5.91 (*)    Hemoglobin 17.4 (*)    HCT 53.9 (*)    All other components within normal limits  SARS CORONAVIRUS 2 (HOSPITAL ORDER, [facility])  LIPASE, BLOOD  URINALYSIS, ROUTINE W REFLEX MICROSCOPIC    EKG None  Radiology Ct Abdomen Pelvis Wo Contrast  Result Date: 03/12/2019 CLINICAL DATA:  Abdominal pain with nausea and vomiting. Concern for small bowel obstruction. EXAM: CT CHEST, ABDOMEN AND PELVIS WITHOUT CONTRAST TECHNIQUE: Multidetector CT imaging of the chest, abdomen and pelvis was performed following the standard protocol without IV contrast. COMPARISON:  06/03/2017. FINDINGS: CT CHEST FINDINGS Cardiovascular: Aortic atherosclerosis. Mild cardiomegaly, with LAD coronary artery atherosclerosis. Ascending aortic  dilatation including at 4.2 cm on 32/2, similar. Tortuosity. Mediastinum/Nodes: No mediastinal or definite hilar adenopathy, given limitations of unenhanced CT. Lungs/Pleura: No pleural fluid. Left base subsegmental atelectasis or scar. A nodule along the left major fissure is similar at 4 mm and can be presumed benign. There is also a perifissural 2 mm right upper lobe pulmonary nodule which is unchanged. Calcified left upper lobe granuloma. Musculoskeletal: Left shoulder arthroplasty. Lower thoracic spondylosis. CT ABDOMEN PELVIS FINDINGS Hepatobiliary: Caudate lobe enlargement is nonspecific. Old granulomatous disease within. Cholecystectomy, without biliary ductal dilatation. Pancreas: Fatty replacement involving the pancreatic head and less so body. Spleen: Normal in size, without focal  abnormality. Adrenals/Urinary Tract: Normal adrenal glands. Mild bilateral renal cortical thinning. Low-density bilateral renal lesions are likely cysts. Other lesions are too small to characterize. Punctate right renal collecting system calculi. No hydronephrosis. A left pelvic calcification is positioned adjacent to the left ureter on 115/2. No bladder calculi. Stomach/Bowel: Stomach is contrast filled. The colon is normal to decompressed caliber. Right hemicolectomy. Proximal small bowel loops are fluid-filled and measure up to 4.0 cm. A "small bowel feces sign" is identified on 93/2 at the site of a transition to decompressed bowel. No mass in this area. No complicating ischemia. Vascular/Lymphatic: Aortic and branch vessel atherosclerosis. No abdominopelvic adenopathy. Reproductive: Normal prostate. Other: No significant free fluid. No free intraperitoneal air. No evidence of omental or peritoneal disease. Musculoskeletal: Mild osteopenia. Presumed bone islands within the pelvis. IMPRESSION: 1. Small bowel obstruction within the mid ileum, likely due to underlying adhesions. No complicating ischemia. 2. Status post right hemicolectomy. No noncontrast CT findings of metastatic disease in the abdomen or pelvis. 3.  No acute process in the chest. 4. Coronary artery atherosclerosis. Aortic Atherosclerosis (ICD10-I70.0). 5. Right nephrolithiasis. 6. Similar mild ascending aortic dilatation. Recommend annual imaging followup by CTA or MRA. This recommendation follows 2010 ACCF/AHA/AATS/ACR/ASA/SCA/SCAI/SIR/STS/SVM Guidelines for the Diagnosis and Management of Patients with Thoracic Aortic Disease. Circulation. 2010; 121: B017-P102. Aortic aneurysm NOS (ICD10-I71.9). Electronically Signed   By: Abigail Miyamoto M.D.   On: 03/12/2019 15:59   Ct Chest Wo Contrast  Result Date: 03/12/2019 CLINICAL DATA:  Abdominal pain with nausea and vomiting. Concern for small bowel obstruction. EXAM: CT CHEST, ABDOMEN AND PELVIS  WITHOUT CONTRAST TECHNIQUE: Multidetector CT imaging of the chest, abdomen and pelvis was performed following the standard protocol without IV contrast. COMPARISON:  06/03/2017. FINDINGS: CT CHEST FINDINGS Cardiovascular: Aortic atherosclerosis. Mild cardiomegaly, with LAD coronary artery atherosclerosis. Ascending aortic dilatation including at 4.2 cm on 32/2, similar. Tortuosity. Mediastinum/Nodes: No mediastinal or definite hilar adenopathy, given limitations of unenhanced CT. Lungs/Pleura: No pleural fluid. Left base subsegmental atelectasis or scar. A nodule along the left major fissure is similar at 4 mm and can be presumed benign. There is also a perifissural 2 mm right upper lobe pulmonary nodule which is unchanged. Calcified left upper lobe granuloma. Musculoskeletal: Left shoulder arthroplasty. Lower thoracic spondylosis. CT ABDOMEN PELVIS FINDINGS Hepatobiliary: Caudate lobe enlargement is nonspecific. Old granulomatous disease within. Cholecystectomy, without biliary ductal dilatation. Pancreas: Fatty replacement involving the pancreatic head and less so body. Spleen: Normal in size, without focal abnormality. Adrenals/Urinary Tract: Normal adrenal glands. Mild bilateral renal cortical thinning. Low-density bilateral renal lesions are likely cysts. Other lesions are too small to characterize. Punctate right renal collecting system calculi. No hydronephrosis. A left pelvic calcification is positioned adjacent to the left ureter on 115/2. No bladder calculi. Stomach/Bowel: Stomach is contrast filled. The colon is normal to decompressed caliber. Right hemicolectomy.  Proximal small bowel loops are fluid-filled and measure up to 4.0 cm. A "small bowel feces sign" is identified on 93/2 at the site of a transition to decompressed bowel. No mass in this area. No complicating ischemia. Vascular/Lymphatic: Aortic and branch vessel atherosclerosis. No abdominopelvic adenopathy. Reproductive: Normal prostate. Other:  No significant free fluid. No free intraperitoneal air. No evidence of omental or peritoneal disease. Musculoskeletal: Mild osteopenia. Presumed bone islands within the pelvis. IMPRESSION: 1. Small bowel obstruction within the mid ileum, likely due to underlying adhesions. No complicating ischemia. 2. Status post right hemicolectomy. No noncontrast CT findings of metastatic disease in the abdomen or pelvis. 3.  No acute process in the chest. 4. Coronary artery atherosclerosis. Aortic Atherosclerosis (ICD10-I70.0). 5. Right nephrolithiasis. 6. Similar mild ascending aortic dilatation. Recommend annual imaging followup by CTA or MRA. This recommendation follows 2010 ACCF/AHA/AATS/ACR/ASA/SCA/SCAI/SIR/STS/SVM Guidelines for the Diagnosis and Management of Patients with Thoracic Aortic Disease. Circulation. 2010; 121: X450-T888. Aortic aneurysm NOS (ICD10-I71.9). Electronically Signed   By: Abigail Miyamoto M.D.   On: 03/12/2019 15:59    Procedures Procedures (including critical care time)  Medications Ordered in ED Medications  morphine 4 MG/ML injection 4 mg (has no administration in time range)  sodium chloride 0.9 % bolus 500 mL (500 mLs Intravenous New Bag/Given 03/12/19 1410)  ondansetron (ZOFRAN) injection 4 mg (4 mg Intravenous Given 03/12/19 1410)     Initial Impression / Assessment and Plan / ED Course  I have reviewed the triage vital signs and the nursing notes.  Pertinent labs & imaging results that were available during my care of the patient were reviewed by me and considered in my medical decision making (see chart for details).        Patient with abdominal pain nausea vomiting.  History of bowel obstructions after surgery.  Appears to have another bowel obstruction.  States he did pass just a little bit of gas after the CAT scan.  NG tube will be placed.  Creatinine is elevated almost 3.  Last labs we have here were a year ago and were around 2, however patient states that he thinks he  may have been up near 3 recently.  Fluid boluses been given.  Will discuss with hospitalist for admission and general surgery for consult.  Dr. [name] is on for general surgery, who is familiar with the patient.  Final Clinical Impressions(s) / ED Diagnoses   Final diagnoses:  None    ED Discharge Orders    None       Davonna Belling, MD 03/12/19 1615

## 2019-03-12 NOTE — ED Notes (Signed)
Report given to [facility]  RN for 4E, Room 1413.

## 2019-03-12 NOTE — Consult Note (Addendum)
Re:   Derek [name]  He goes by Derek [name]".   DOB:   [birth year] MRN:   [id number]  Chief Complaint Bowel obstruction  ASSESEMENT AND PLAN: 1.  SBO  Has NGT.  Already over 1,000 cc removed.  Looks like SB protocol ordered.  Feels a little better.  2.  Multiple abdominal operations  Ileostomy reversal - 07/23/2017 - Farheen Pfahler  Transverse colectomy - 08/14/2016 for transverse colon cancer ---> right hemicolectomy and ileostomy for an ischemic leak - 08/27/2016 - Kimberli Winne  Remote ileocectomy for Crohn's disease around 1978, then required reoperation soon after that  3.  History of Crohn's disease 4.  History of transverse colon cancer  Resected - 08/14/2016 - 1.2 cm adenoca, 0.7 nodes (T2, N0) 5.  Paroxysmal A fib 6.  Anticoagulated on Eliquis 7.  Thoracic aortic aneurysm followed by Dr. Servando Snare 8.  CKD - followed by Dr. Jimmy Footman  Creatinine - 2.98 - 03/12/2019  This will make managing his volume more difficult. 9.  History of PE 10. Nephrolithiasis 11. Post traumatic stress syndrome 12. Covid-19 negative    Chief Complaint  Patient presents with  . Abdominal Pain  . Nausea  . Possible Blockage   PHYSICIAN REQUESTING CONSULTATION: Sharilyn Sites, MD  HISTORY OF PRESENT ILLNESS: Derek [name] is a 78 y.o. (DOB: 1940-10-02)  white male whose primary care physician is Sharilyn Sites, MD.  I tried to call his wife [name], but she did not answer.   The patient presents with the symptoms of a SBO.  He has had several of these in the past.  He had a couple of sandwiches yesterday, then got sick around 2:00 PM.  He normally has 5+ BM's day (secondary to his prior bowel resections).  His last BM was yesterday afternoon.  He also has had several SBO's in the past, so he is familiar with the symptoms.  He has a very complex bowel/surgical history involving both Crohn's disease and colon cancer.   He originally had an ileocectomy for Crohn's disease around 1978, then required reoperation  soon after that operation.  He was plagued with recurrent SBO's after those first operations.     Then in August 2017 he had a colonoscopy by Dr. Carlean Purl who found a suspicious polyp of his transverse colon.  He underwent a transverse colectomy on 08/14/2016 by Dr. Keturah Barre. Marilyne Haseley for what proved to be a transverse colon cancer.  He then developed an ischemic leak and required a right hemicolectomy and ileostomy for an ischemic leak on 08/27/2016.  The Ileostomy was reversed on 07/23/2017 by Dr. Keturah Barre. Goddess Gebbia.   CT chest/abdomen - 03/12/2019  1. Small bowel obstruction within the mid ileum, likely due to underlying adhesions. No complicating ischemia.  2. Status post right hemicolectomy. No noncontrast CT findings of metastatic disease in the abdomen or pelvis.  3.  No acute process in the chest.  4. Coronary artery atherosclerosis. Aortic Atherosclerosis (ICD10-I70.0).  5. Right nephrolithiasis.  6. Similar mild ascending aortic dilatation.   Past Medical History:  Diagnosis Date  . Allergy   . Anemia   . Anxiety   . Aortic insufficiency    a. mild-mod by echo 09/2015.  . Arthritis    "knees; left shoulder" (09/21/2013)  . Ascending aortic aneurysm (HCC)    a. last measurement 5.3 cm 03/2016 -> f/u planned 09/2015 to continue to follow.  . Atrial fibrillation ([place])   . B12 deficiency    takes Vit 12 shot every 14days   .  Cataract   . CKD (chronic kidney disease) stage 3, GFR 30-59 ml/min (HCC) 08/22/2011  . Clotting disorder ([place])   . Crohn's disease ([place])   . Depression   . Enlarged prostate   . Enteric hyperoxaluria 02/21/2016  . GERD (gastroesophageal reflux disease)    takes Omeprazole daily  . Gout    takes Uloric and Colchicine daily  . Heart murmur   . Hepatitis C 1978   negtive RNA load - spontaneously cleared  . Hiatal hernia   . History of blood transfusion 1978; 1990's; ?   "w/bowel resection; S/P allupurinol; ?" (09/21/2013)  . History of colon polyps   . History of kidney stones    . History of MRSA infection 2010  . History of pulmonary embolism 2006   both legs and both lungs /notes 08/26/2008 (09/21/2013)  . History of small bowel obstruction   . History of staph infection 1978  . Hyperoxaluria    Intestinal  . Hypertension   . Insomnia    takes Trazodone nightly  . Internal hemorrhoids   . LV dysfunction    a. h/o EF 45-50% in 2015, normalized on subsequent echoes.  . Nephrolithiasis   . Pancreatitis 2010   elevated lipase and amylase, stranding in tail of pancreas, ? from Humira  . Pancytopenia    Hx of  . Peripheral neuropathy    takes Gabapentin daily  . Pneumonia    hx of   . Post-traumatic stress syndrome    takes Paxil nightly  . PTSD (post-traumatic stress disorder)   . Pulmonary nodule    a. 43m by CT 05/2015, recommended f/u 6-12 months.  . RLS (restless legs syndrome)   . Rosacea conjunctivitis(372.31)    takes Minocin daily  . Secondary hyperparathyroidism ([place] 02/21/2016  . Sinus bradycardia   . Skin cancer    "cut/burned off left ear and face" (09/21/2013)  . Small bowel obstruction ([place]   . Thrombocytopenia ([place]    hx of      Past Surgical History:  Procedure Laterality Date  . ANKLE SURGERY Right   . APPENDECTOMY  1978  . BOWEL RESECTION  1978 X 2  . CARDIOVERSION N/A 05/29/2016   Procedure: CARDIOVERSION;  Surgeon: MSanda Klein MD;  Location: [facility];  Service: Cardiovascular;  Laterality: N/A;  . CHOLECYSTECTOMY    . Colon Cancer    . COLON RESECTION N/A 08/14/2016   Procedure: LAPAROSCOPIC RESECTION TRANSVERSE COLON;  Surgeon: DAlphonsa Overall MD;  Location: [facility];  Service: General;  Laterality: N/A;  . COLON SURGERY    . COLONOSCOPY    . ESOPHAGOGASTRODUODENOSCOPY    . EYE SURGERY     cataract surgery bilateral  . FOOT SURGERY Right    "took gout out"  . HEMICOLECTOMY Right   . ILEOCECETOMY  1978   /[name]10/03/2000  (09/21/2013)  . ILEOSTOMY    . ILEOSTOMY CLOSURE N/A 07/23/2017   Procedure: ILEOSTOMY  REVERSAL ;  Surgeon: NAlphonsa Overall MD;  Location: [facility];  Service: General;  Laterality: N/A;  . INGUINAL HERNIA REPAIR Right   . IR FLUORO GUIDE CV LINE RIGHT  05/07/2017  . IR REMOVAL TUN CV CATH W/O FL  10/08/2017  . IR [place]ASC ACCESS RIGHT  05/07/2017  . KNEE ARTHROSCOPY Left   . LAPAROTOMY N/A 08/27/2016   Procedure: EXPLORATORYLAPAROTOMY, LYSIS OF ADHESIONS, ILEOSTOMY, RIGHT COLECTOMY;  Surgeon: DAlphonsa Overall MD;  Location: [facility];  Service: General;  Laterality: N/A;  . LIGAMENT REPAIR  Left   . POLYPECTOMY    . TEE WITHOUT CARDIOVERSION N/A 05/29/2016   Procedure: TRANSESOPHAGEAL ECHOCARDIOGRAM (TEE);  Surgeon: Sanda Klein, MD;  Location: [facility];  Service: Cardiovascular;  Laterality: N/A;  . TOTAL SHOULDER ARTHROPLASTY Left 09/21/2013  . TOTAL SHOULDER ARTHROPLASTY Left 09/21/2013   Procedure: LEFT TOTAL SHOULDER ARTHROPLASTY;  Surgeon: Marin Shutter, MD;  Location: [facility];  Service: Orthopedics;  Laterality: Left;      Current Facility-Administered Medications  Medication Dose Route Frequency Provider Last Rate Last Dose  . 0.9 %  sodium chloride infusion  500 mL Intravenous Once [name], MD      . diatrizoate meglumine-sodium (GASTROGRAFIN) 66-10 % solution 90 mL  90 mL Per NG tube Once Cherylann Ratel A, DO       Current Outpatient Medications  Medication Sig Dispense Refill  . Acidophilus Lactobacillus CAPS Take 1 capsule by mouth daily. 90 capsule 3  . amiodarone (PACERONE) 200 MG tablet Take 1 tablet (200 mg total) by mouth daily. 90 tablet 3  . calcium carbonate (TUMS - DOSED IN MG ELEMENTAL CALCIUM) 500 MG chewable tablet Chew 2 tablets by mouth 2 (two) times daily.    . carboxymethylcellulose (REFRESH PLUS) 0.5 % SOLN Place 1 drop into both eyes 4 (four) times daily.    . Cholecalciferol (VITAMIN D) 2000 units CAPS Take 2,000 Units by mouth daily.    . clindamycin (CLEOCIN T) 1 % external solution Apply 1 application topically daily as needed (head  breakouts).    . cyanocobalamin (,VITAMIN B-12,) 1000 MCG/ML injection Inject 1,000 mcg into the muscle every 14 (fourteen) days.    Marland Kitchen ELIQUIS 5 MG TABS tablet Take 1 tablet (5 mg total) by mouth 2 (two) times daily. 180 tablet 3  . febuxostat (ULORIC) 40 MG tablet Take 40 mg by mouth daily.    . ferrous sulfate 325 (65 FE) MG tablet Take 325 mg by mouth daily with breakfast.    . gabapentin (NEURONTIN) 100 MG capsule Take one (1) capsules (100 mg) by mouth each morning and four (4) capsules (400 mg) by mouth each evening.    Marland Kitchen levothyroxine (SYNTHROID) 50 MCG tablet Take 50 mcg by mouth daily before breakfast.    . magnesium oxide (MAGNESIUM-OXIDE) 400 (241.3 Mg) MG tablet Take 800 mg by mouth 2 (two) times daily.     . minocycline (DYNACIN) 100 MG tablet Take 100 mg by mouth 2 (two) times daily as needed (face breakout).    . Multiple Vitamin (MULTIVITAMIN WITH MINERALS) TABS tablet Take 1 tablet by mouth daily.    Marland Kitchen omeprazole (PRILOSEC) 20 MG capsule Take 20 mg by mouth daily.      Marland Kitchen PARoxetine (PAXIL) 20 MG tablet Take 20 mg by mouth at bedtime.    . polyethylene glycol powder (GLYCOLAX/MIRALAX) powder Take 1 Container by mouth once.    . sodium bicarbonate 650 MG tablet Take 650 mg by mouth 2 (two) times daily.    Marland Kitchen terazosin (HYTRIN) 2 MG capsule Take 2 mg by mouth at bedtime.    . traZODone (DESYREL) 100 MG tablet Take 100 mg by mouth at bedtime.        Allergies  Allergen Reactions  . Lorazepam Other (See Comments)    Reaction:  Hallucinations   . Humira [Adalimumab] Other (See Comments)    Pt states that he got pancreatitis.    . Quinolones     Patient was warned about not using Cipro and similar antibiotics.  Recent studies have raised concern that fluoroquinolone antibiotics could be associated with an increased risk of aortic aneurysm Fluoroquinolones have non-antimicrobial properties that might jeopardise the integrity of the extracellular matrix of the vascular wall In a   propensity score matched cohort study in [place], there was a 66% increased rate of aortic aneurysm or dissection associated with oral fluoroquinolone use, compared wit    REVIEW OF SYSTEMS: Skin:  No history of rash.  No history of abnormal moles. Infection:  He had a Norcardia infection that was treated for a year in 2018 Neurologic:  No history of stroke.  No history of seizure.  No history of headaches. Cardiac:  History of A. Fib.   Dilated ascending aortic root - followed by Dr. Servando Snare Pulmonary:  Remote history of PE  Endocrine:  No diabetes. No thyroid disease. Gastrointestinal:  See HPI.  HIstory of Hep C. Urologic:  Nephrolithiasis.  Chronic kidney disease followed by Dr. Jimmy Footman Musculoskeletal:  No history of joint or back disease.  History of gout Hematologic:  Anticoagulated on Eliquis. Psycho-social:  The patient is oriented.   The patient has no obvious psychologic or social impairment to understanding our conversation and plan.  History of PTSD.  SOCIAL and FAMILY HISTORY: Married.  Wife [name]  .  PHYSICAL EXAM: BP 120/74 (BP Location: Left Arm)   Pulse 91   Temp (!) 97.5 F (36.4 C) (Oral)   Resp 14   SpO2 95%   General: Older WM who is alert.  Looks a little puny.  Has an NGT. Skin:  Inspection and palpation - no mass or rash. Eyes:  Conjunctiva and lids unremarkable.            Pupils are equal Ears, Nose, Mouth, and Throat:  Ears and nose unremarkable            Lips and teeth are unremarable. Neck: Supple. No mass, trachea midline.  No thyroid mass. Lymph Nodes:  No supraclavicular, cervical, or inguinal nodes. Lungs: Normal respiratory effort.  Clear to auscultation and symmetric breath sounds. Heart:  Palpation of the heart is normal.            Auscultation: RRR. No murmur or rub.  Abdomen: Mild distention.  Has multiple abdominal scars.  No obvious hernia.  No localized tenderness.  Has BS. Rectal: Not done. Musculoskeletal:  Good muscle  strength and ROM  in upper and lower extremities.  Neurologic:  Grossly intact to motor and sensory function. Psychiatric: Normal judgement and insight. Behavior is normal.            Oriented to time, person, place.   DATA REVIEWED, COUNSELING AND COORDINATION OF CARE: Epic notes reviewed. Counseling and coordination of care exceeded more than 50% of the time spent with patient. Total time spent with patient and charting: 45 minutes.  [name], MD,  [facility], [address]  [place], [place]    [place] Phone:  [phone] FAX:  [phone]

## 2019-03-12 NOTE — H&P (Addendum)
.  History and Physical    Derek [name] HYW:[id number] DOB: [birth year] DOA: 03/12/2019  PCP: Sharilyn Sites, MD  Patient coming from: Home   Chief Complaint: "I've vomited at least 8 times."  HPI: Derek QUEBEDEAUX is a 78 y.o. male with medical history significant of Crohn's disease, colon CA s/p resection. Pt reports that last night he started having significant nausea. He vomited several times. He reports abdominal pain started then as well. It is global in nature and sharp. Pressure and eating make his symptoms worse. Nothing has made it better. His nausea would come in wave, but his pain has been fairly persistent. He became concern and spoke with his GI service. They recommended that he come to the ED.   ED Course: Imaging obtained in the ED showed an SBO likely secondary to adhesions. [facility] was called for admission. ED staff consulted general surgery (Dr. [name]) for recommendations.   Review of Systems: Denies CP, palpitations, dyspnea. Reports chronic diarrhea, anxiety. Remainder of 10 point review of systems is otherwise negative for all not mentioned in HPI.    Past Medical History:  Diagnosis Date   Allergy    Anemia    Anxiety    Aortic insufficiency    a. mild-mod by echo 09/2015.   Arthritis    "knees; left shoulder" (09/21/2013)   Ascending aortic aneurysm (HCC)    a. last measurement 5.3 cm 03/2016 -> f/u planned 09/2015 to continue to follow.   Atrial fibrillation ([place])    B12 deficiency    takes Vit 12 shot every 14days    Cataract    CKD (chronic kidney disease) stage 3, GFR 30-59 ml/min (HCC) 08/22/2011   Clotting disorder ([place])    Crohn's disease ([place])    Depression    Enlarged prostate    Enteric hyperoxaluria 02/21/2016   GERD (gastroesophageal reflux disease)    takes Omeprazole daily   Gout    takes Uloric and Colchicine daily   Heart murmur    Hepatitis C 1978   negtive RNA load - spontaneously cleared   Hiatal hernia     History of blood transfusion 1978; 1990's; ?   "w/bowel resection; S/P allupurinol; ?" (09/21/2013)   History of colon polyps    History of kidney stones    History of MRSA infection 2010   History of pulmonary embolism 2006   both legs and both lungs /notes 08/26/2008 (09/21/2013)   History of small bowel obstruction    History of staph infection 1978   Hyperoxaluria    Intestinal   Hypertension    Insomnia    takes Trazodone nightly   Internal hemorrhoids    LV dysfunction    a. h/o EF 45-50% in 2015, normalized on subsequent echoes.   Nephrolithiasis    Pancreatitis 2010   elevated lipase and amylase, stranding in tail of pancreas, ? from Humira   Pancytopenia    Hx of   Peripheral neuropathy    takes Gabapentin daily   Pneumonia    hx of    Post-traumatic stress syndrome    takes Paxil nightly   PTSD (post-traumatic stress disorder)    Pulmonary nodule    a. 7m by CT 05/2015, recommended f/u 6-12 months.   RLS (restless legs syndrome)    Rosacea conjunctivitis(372.31)    takes Minocin daily   [place] 02/21[place]   Sinus bradycardia    Skin cancer    "cut/burned off left ear and face" (09/21/2013)  Small bowel obstruction (HCC)    Thrombocytopenia (HCC)    hx of    Past Surgical History:  Procedure Laterality Date   ANKLE SURGERY Right    APPENDECTOMY  1978   BOWEL RESECTION  1978 X 2   CARDIOVERSION N/A 05/29/2016   Procedure: CARDIOVERSION;  Surgeon: Sanda Klein, MD;  Location: [facility];  Service: Cardiovascular;  Laterality: N/A;   CHOLECYSTECTOMY     Colon Cancer     COLON RESECTION N/A 08/14/2016   Procedure: LAPAROSCOPIC RESECTION TRANSVERSE COLON;  Surgeon: [name], MD;  Location: [facility];  Service: General;  Laterality: N/A;   COLON SURGERY     COLONOSCOPY     ESOPHAGOGASTRODUODENOSCOPY     EYE SURGERY     cataract surgery bilateral   FOOT SURGERY Right    "took gout out"    HEMICOLECTOMY Right    ILEOCECETOMY  1978   [name] 05/10/2000  (09/21/2013)   ILEOSTOMY     ILEOSTOMY CLOSURE N/A 07/23/2017   Procedure: ILEOSTOMY REVERSAL ;  Surgeon: [name], MD;  Location: [facility];  Service: General;  Laterality: N/A;   INGUINAL HERNIA REPAIR Right    IR FLUORO GUIDE CV LINE RIGHT  05/07/2017   IR REMOVAL TUN CV CATH W/O FL  10/08/2017   IR US GUIDE VASC ACCESS RIGHT  05/07/2017   KNEE ARTHROSCOPY Left    LAPAROTOMY N/A 08/27/2016   Procedure: EXPLORATORYLAPAROTOMY, LYSIS OF ADHESIONS, ILEOSTOMY, RIGHT COLECTOMY;  Surgeon: [name], MD;  Location: [facility];  Service: General;  Laterality: N/A;   LIGAMENT REPAIR Left    POLYPECTOMY     TEE WITHOUT CARDIOVERSION N/A 05/29/2016   Procedure: TRANSESOPHAGEAL ECHOCARDIOGRAM (TEE);  Surgeon: Sanda Klein, MD;  Location: [facility];  Service: Cardiovascular;  Laterality: N/A;   TOTAL SHOULDER ARTHROPLASTY Left 09/21/2013   TOTAL SHOULDER ARTHROPLASTY Left 09/21/2013   Procedure: LEFT TOTAL SHOULDER ARTHROPLASTY;  Surgeon: Marin Shutter, MD;  Location: [facility];  Service: Orthopedics;  Laterality: Left;     reports that he quit smoking about 43 years ago. His smoking use included cigarettes. He has a 40.00 pack-year smoking history. He quit smokeless tobacco use about 40 years ago.  His smokeless tobacco use included chew. He reports that he does not drink alcohol or use drugs.  Allergies  Allergen Reactions   Lorazepam Other (See Comments)    Reaction:  Hallucinations    Humira [Adalimumab] Other (See Comments)    Pt states that he got pancreatitis.     Quinolones     Patient was warned about not using Cipro and similar antibiotics. Recent studies have raised concern that fluoroquinolone antibiotics could be associated with an increased risk of aortic aneurysm Fluoroquinolones have non-antimicrobial properties that might jeopardise the integrity of the extracellular matrix of the vascular wall In a   propensity score matched cohort study in [place], there was a 66% increased rate of aortic aneurysm or dissection associated with oral fluoroquinolone use, compared wit    Family History  Problem Relation Age of Onset   Kidney disease Father    Hypertension Father    Aneurysm Mother    Aneurysm Sister    Esophageal cancer Neg Hx    Stomach cancer Neg Hx    Rectal cancer Neg Hx    Colon cancer Neg Hx     Prior to Admission medications   Medication Sig Start Date End Date Taking? Authorizing Provider  Acidophilus Lactobacillus CAPS Take 1 capsule by mouth daily.  06/08/16   [name], MD  amiodarone (PACERONE) 200 MG tablet Take 1 tablet (200 mg total) by mouth daily. 11/05/16   End, Harrell Gave, MD  calcium carbonate (TUMS - DOSED IN MG ELEMENTAL CALCIUM) 500 MG chewable tablet Chew 2 tablets by mouth 2 (two) times daily.    [provider]  carboxymethylcellulose (REFRESH PLUS) 0.5 % SOLN Place 1 drop into both eyes 4 (four) times daily.    [provider]  Cholecalciferol (VITAMIN D) 2000 units CAPS Take 2,000 Units by mouth daily.    [provider]  clindamycin (CLEOCIN T) 1 % external solution Apply 1 application topically daily as needed (head breakouts).    [provider]  cyanocobalamin (,VITAMIN B-12,) 1000 MCG/ML injection Inject 1,000 mcg into the muscle every 14 (fourteen) days.    [provider]  ELIQUIS 5 MG TABS tablet Take 1 tablet (5 mg total) by mouth 2 (two) times daily. 11/05/16   End, Harrell Gave, MD  febuxostat (ULORIC) 40 MG tablet Take 40 mg by mouth daily.    [provider]  ferrous sulfate 325 (65 FE) MG tablet Take 325 mg by mouth daily with breakfast.    [provider]  gabapentin (NEURONTIN) 100 MG capsule Take one (1) capsules (100 mg) by mouth each morning and four (4) capsules (400 mg) by mouth each evening.    [provider]  levothyroxine (SYNTHROID) 50 MCG tablet Take 50  mcg by mouth daily before breakfast.    [provider]  magnesium oxide (MAGNESIUM-OXIDE) 400 (241.3 Mg) MG tablet Take 800 mg by mouth 2 (two) times daily.     [provider]  minocycline (DYNACIN) 100 MG tablet Take 100 mg by mouth 2 (two) times daily as needed (face breakout).    [provider]  Multiple Vitamin (MULTIVITAMIN WITH MINERALS) TABS tablet Take 1 tablet by mouth daily.    [provider]  omeprazole (PRILOSEC) 20 MG capsule Take 20 mg by mouth daily.      [provider]  PARoxetine (PAXIL) 20 MG tablet Take 20 mg by mouth at bedtime.    [provider]  polyethylene glycol powder (GLYCOLAX/MIRALAX) powder Take 1 Container by mouth once.    [provider]  sodium bicarbonate 650 MG tablet Take 650 mg by mouth 2 (two) times daily.    [provider]  terazosin (HYTRIN) 2 MG capsule Take 2 mg by mouth at bedtime.    [provider]  traZODone (DESYREL) 100 MG tablet Take 100 mg by mouth at bedtime.    [provider]  Calcium Carbonate (CALCIUM 500 PO) Take 2 tablets by mouth 2 (two) times daily.   05/11/16  [provider]    Physical Exam: Vitals:   03/12/19 1145 03/12/19 1354 03/12/19 1430 03/12/19 1630  BP: 132/83 127/89 137/83 (!) 142/86  Pulse: 99 94 86 93  Resp: _0 Temp: (!) 97.5 F (36.4 C)     TempSrc: Oral     SpO2: 96% 99% 97% 97%    Constitutional: 78 y.o. male NAD, calm, comfortable Vitals:   03/12/19 1145 03/12/19 1354 03/12/19 1430 03/12/19 1630  BP: 132/83 127/89 137/83 (!) 142/86  Pulse: 99 94 86 93  Resp: _1 Temp: (!) 97.5 F (36.4 C)     TempSrc: Oral     SpO2: 96% 99% 97% 97%   Eyes: PERRL, lids and conjunctivae normal ENMT: Mucous membranes  are moist. Posterior pharynx clear of any exudate or lesions.Normal dentition.  Neck: normal, supple, no masses, no thyromegaly Respiratory: clear to auscultation bilaterally, no  wheezing, no crackles. Normal respiratory effort. No accessory muscle use.  Cardiovascular: Regular rate and rhythm, no murmurs / rubs / gallops. No extremity edema. 2+ pedal pulses. No carotid bruits.  Abdomen: global tenderness, no masses palpated. No hepatosplenomegaly. Bowel sounds positive, distended  Musculoskeletal: no clubbing / cyanosis. No joint deformity upper and lower extremities. Good ROM, no contractures. Normal muscle tone.  Skin: no rashes, lesions, ulcers. No induration Neurologic: CN 2-12 grossly intact. Sensation intact, DTR normal. Strength 5/5 in all 4.  Psychiatric: Normal judgment and insight. Alert and oriented x 3. Somewhat anxious.    Labs on Admission: I have personally reviewed following labs and imaging studies  CBC: Recent Labs  Lab 03/12/19 1152  WBC 11.9*  HGB 17.4*  HCT 53.9*  MCV 91.2  PLT 629   Basic Metabolic Panel: Recent Labs  Lab 03/12/19 1152  NA 146*  K 4.6  CL 91*  CO2 36*  GLUCOSE 134*  BUN 29*  CREATININE 2.98*  CALCIUM 10.5*   GFR: CrCl cannot be calculated (Unknown ideal weight.). Liver Function Tests: Recent Labs  Lab 03/12/19 1152  AST 110*  ALT 127*  ALKPHOS 195*  BILITOT 1.8*  PROT 9.0*  ALBUMIN 5.2*   Recent Labs  Lab 03/12/19 1152  LIPASE 44   No results for input(s): AMMONIA in the last 168 hours. Coagulation Profile: No results for input(s): INR, PROTIME in the last 168 hours. Cardiac Enzymes: No results for input(s): CKTOTAL, CKMB, CKMBINDEX, TROPONINI in the last 168 hours. BNP (last 3 results) No results for input(s): PROBNP in the last 8760 hours. HbA1C: No results for input(s): HGBA1C in the last 72 hours. CBG: No results for input(s): GLUCAP in the last 168 hours. Lipid Profile: No results for input(s): CHOL, HDL, LDLCALC, TRIG, CHOLHDL, LDLDIRECT in the last 72 hours. Thyroid Function Tests: No results for input(s): TSH, T4TOTAL, FREET4, T3FREE, THYROIDAB in the last 72 hours. Anemia  Panel: No results for input(s): VITAMINB12, FOLATE, FERRITIN, TIBC, IRON, RETICCTPCT in the last 72 hours. Urine analysis:    Component Value Date/Time   COLORURINE YELLOW 10/31/2016 0705   APPEARANCEUR CLEAR 10/31/2016 0705   LABSPEC 1.013 10/31/2016 0705   PHURINE 5.0 10/31/2016 0705   GLUCOSEU NEGATIVE 10/31/2016 0705   HGBUR NEGATIVE 10/31/2016 0705   BILIRUBINUR NEGATIVE 10/31/2016 0705   KETONESUR NEGATIVE 10/31/2016 0705   PROTEINUR NEGATIVE 10/31/2016 0705   NITRITE NEGATIVE 10/31/2016 0705   LEUKOCYTESUR NEGATIVE 10/31/2016 0705    Radiological Exams on Admission: Ct Abdomen Pelvis Wo Contrast  Result Date: 03/12/2019 CLINICAL DATA:  Abdominal pain with nausea and vomiting. Concern for small bowel obstruction. EXAM: CT CHEST, ABDOMEN AND PELVIS WITHOUT CONTRAST TECHNIQUE: Multidetector CT imaging of the chest, abdomen and pelvis was performed following the standard protocol without IV contrast. COMPARISON:  06/03/2017. FINDINGS: CT CHEST FINDINGS Cardiovascular: Aortic atherosclerosis. Mild cardiomegaly, with LAD coronary artery atherosclerosis. Ascending aortic dilatation including at 4.2 cm on 32/2, similar. Tortuosity. Mediastinum/Nodes: No mediastinal or definite hilar adenopathy, given limitations of unenhanced CT. Lungs/Pleura: No pleural fluid. Left base subsegmental atelectasis or scar. A nodule along the left major fissure is similar at 4 mm and can be presumed benign. There is also a perifissural 2 mm right upper lobe pulmonary nodule which is unchanged. Calcified left upper lobe granuloma. Musculoskeletal: Left shoulder arthroplasty. Lower thoracic spondylosis. CT  ABDOMEN PELVIS FINDINGS Hepatobiliary: Caudate lobe enlargement is nonspecific. Old granulomatous disease within. Cholecystectomy, without biliary ductal dilatation. Pancreas: Fatty replacement involving the pancreatic head and less so body. Spleen: Normal in size, without focal abnormality. Adrenals/Urinary Tract:  Normal adrenal glands. Mild bilateral renal cortical thinning. Low-density bilateral renal lesions are likely cysts. Other lesions are too small to characterize. Punctate right renal collecting system calculi. No hydronephrosis. A left pelvic calcification is positioned adjacent to the left ureter on 115/2. No bladder calculi. Stomach/Bowel: Stomach is contrast filled. The colon is normal to decompressed caliber. Right hemicolectomy. Proximal small bowel loops are fluid-filled and measure up to 4.0 cm. A "small bowel feces sign" is identified on 93/2 at the site of a transition to decompressed bowel. No mass in this area. No complicating ischemia. Vascular/Lymphatic: Aortic and branch vessel atherosclerosis. No abdominopelvic adenopathy. Reproductive: Normal prostate. Other: No significant free fluid. No free intraperitoneal air. No evidence of omental or peritoneal disease. Musculoskeletal: Mild osteopenia. Presumed bone islands within the pelvis. IMPRESSION: 1. Small bowel obstruction within the mid ileum, likely due to underlying adhesions. No complicating ischemia. 2. Status post right hemicolectomy. No noncontrast CT findings of metastatic disease in the abdomen or pelvis. 3.  No acute process in the chest. 4. Coronary artery atherosclerosis. Aortic Atherosclerosis (ICD10-I70.0). 5. Right nephrolithiasis. 6. Similar mild ascending aortic dilatation. Recommend annual imaging followup by CTA or MRA. This recommendation follows 2010 ACCF/AHA/AATS/ACR/ASA/SCA/SCAI/SIR/STS/SVM Guidelines for the Diagnosis and Management of Patients with Thoracic Aortic Disease. Circulation. 2010; 121: X323-F573. Aortic aneurysm NOS (ICD10-I71.9). Electronically Signed   By: Abigail Miyamoto M.D.   On: 03/12/2019 15:59   Ct Chest Wo Contrast  Result Date: 03/12/2019 CLINICAL DATA:  Abdominal pain with nausea and vomiting. Concern for small bowel obstruction. EXAM: CT CHEST, ABDOMEN AND PELVIS WITHOUT CONTRAST TECHNIQUE: Multidetector  CT imaging of the chest, abdomen and pelvis was performed following the standard protocol without IV contrast. COMPARISON:  06/03/2017. FINDINGS: CT CHEST FINDINGS Cardiovascular: Aortic atherosclerosis. Mild cardiomegaly, with LAD coronary artery atherosclerosis. Ascending aortic dilatation including at 4.2 cm on 32/2, similar. Tortuosity. Mediastinum/Nodes: No mediastinal or definite hilar adenopathy, given limitations of unenhanced CT. Lungs/Pleura: No pleural fluid. Left base subsegmental atelectasis or scar. A nodule along the left major fissure is similar at 4 mm and can be presumed benign. There is also a perifissural 2 mm right upper lobe pulmonary nodule which is unchanged. Calcified left upper lobe granuloma. Musculoskeletal: Left shoulder arthroplasty. Lower thoracic spondylosis. CT ABDOMEN PELVIS FINDINGS Hepatobiliary: Caudate lobe enlargement is nonspecific. Old granulomatous disease within. Cholecystectomy, without biliary ductal dilatation. Pancreas: Fatty replacement involving the pancreatic head and less so body. Spleen: Normal in size, without focal abnormality. Adrenals/Urinary Tract: Normal adrenal glands. Mild bilateral renal cortical thinning. Low-density bilateral renal lesions are likely cysts. Other lesions are too small to characterize. Punctate right renal collecting system calculi. No hydronephrosis. A left pelvic calcification is positioned adjacent to the left ureter on 115/2. No bladder calculi. Stomach/Bowel: Stomach is contrast filled. The colon is normal to decompressed caliber. Right hemicolectomy. Proximal small bowel loops are fluid-filled and measure up to 4.0 cm. A "small bowel feces sign" is identified on 93/2 at the site of a transition to decompressed bowel. No mass in this area. No complicating ischemia. Vascular/Lymphatic: Aortic and branch vessel atherosclerosis. No abdominopelvic adenopathy. Reproductive: Normal prostate. Other: No significant free fluid. No free  intraperitoneal air. No evidence of omental or peritoneal disease. Musculoskeletal: Mild osteopenia. Presumed bone islands within the pelvis.  IMPRESSION: 1. Small bowel obstruction within the mid ileum, likely due to underlying adhesions. No complicating ischemia. 2. Status post right hemicolectomy. No noncontrast CT findings of metastatic disease in the abdomen or pelvis. 3.  No acute process in the chest. 4. Coronary artery atherosclerosis. Aortic Atherosclerosis (ICD10-I70.0). 5. Right nephrolithiasis. 6. Similar mild ascending aortic dilatation. Recommend annual imaging followup by CTA or MRA. This recommendation follows 2010 ACCF/AHA/AATS/ACR/ASA/SCA/SCAI/SIR/STS/SVM Guidelines for the Diagnosis and Management of Patients with Thoracic Aortic Disease. Circulation. 2010; 121: K440-N027. Aortic aneurysm NOS (ICD10-I71.9). Electronically Signed   By: Abigail Miyamoto M.D.   On: 03/12/2019 15:59    Assessment/Plan Principal Problem:   SBO (small bowel obstruction) (HCC) Active Problems:   POST TRAUMATIC STRESS SYNDROME   Ascending aortic aneurysm (HCC)   CKD (chronic kidney disease) stage 3, GFR 30-59 ml/min (HCC)   Essential hypertension   Crohn's disease of colon with complication (HCC)   AKI (acute kidney injury) ([place])   Elevated LFTs   SBO     - as seen on CT; ?adhesions     - history of recurrent SBO     - admit inpt, med-tele     - general surgery consulted by ED     - SBO protocol w/ NGT     - try to limit narcotics as much as possible  AKI on CKD3     - fluids, follow up renal function panel     - watch nephrotoxins     - check UA  Elevated LFTs     - check hep panel     - Hx of Hep C     - CT ab/pelvis noted     - chronic elevation of alk phos  HTN     - resume home meds when able     - metprolol IV q6h onboard  A fib     - on eliquis, amiodarone     - for now, cover w/ metoprolol IV q6h     - can't have PO meds right now, will cover [facility] w/ heparin gtt  Ascending  aortic aneurysm     - CT chest noted; continue imaging surveillance outpt.   PTSD     - resume paxil when able  DVT prophylaxis: heparin  Code Status: FULL  Family Communication: None at bedside  Disposition Plan: TBD  Consults called: General Surgery called by ED  Admission status: Inpatient. Med-tele. High risk of decompensation w/o acute intervention. Will need IV support until he can resume PO status.    COVID-19 Screen Negative.  Jonnie Finner DO [facility] Pager [phone]  If 7PM-7AM, please contact night-coverage [email or web address] Password [email or web address]  03/12/2019, 4:53 PM

## 2019-03-12 NOTE — Telephone Encounter (Signed)
Patient and his wife called.  Patient normally has up to 10 watery stools a day at baseline following extensive bowel resections.  Starting last night he developed abdominal distention and started having bowel movements.  He has had temporary relief after emesis of nonbloody material.  The swelling starts back and leads to vomiting.  The symptoms are consistent with previous episodes of bowel obstructions.  Patient has a history of Crohn's disease and multiple surgeries. Advised the patient that he would need to go to the ED.  He is going to [facility] ED today.

## 2019-03-13 ENCOUNTER — Inpatient Hospital Stay (HOSPITAL_COMMUNITY): Payer: No Typology Code available for payment source

## 2019-03-13 LAB — CBC
HCT: 50.2 % (ref 39.0–52.0)
Hemoglobin: 15.3 g/dL (ref 13.0–17.0)
MCH: 28.5 pg (ref 26.0–34.0)
MCHC: 30.5 g/dL (ref 30.0–36.0)
MCV: 93.7 fL (ref 80.0–100.0)
Platelets: 118 10*3/uL — ABNORMAL LOW (ref 150–400)
RBC: 5.36 MIL/uL (ref 4.22–5.81)
RDW: 14.7 % (ref 11.5–15.5)
WBC: 9.6 10*3/uL (ref 4.0–10.5)
nRBC: 0 % (ref 0.0–0.2)

## 2019-03-13 LAB — COMPREHENSIVE METABOLIC PANEL
ALT: 174 U/L — ABNORMAL HIGH (ref 0–44)
AST: 158 U/L — ABNORMAL HIGH (ref 15–41)
Albumin: 4.1 g/dL (ref 3.5–5.0)
Alkaline Phosphatase: 158 U/L — ABNORMAL HIGH (ref 38–126)
Anion gap: 18 — ABNORMAL HIGH (ref 5–15)
BUN: 33 mg/dL — ABNORMAL HIGH (ref 8–23)
CO2: 34 mmol/L — ABNORMAL HIGH (ref 22–32)
Calcium: 8.7 mg/dL — ABNORMAL LOW (ref 8.9–10.3)
Chloride: 89 mmol/L — ABNORMAL LOW (ref 98–111)
Creatinine, Ser: 3.04 mg/dL — ABNORMAL HIGH (ref 0.61–1.24)
GFR calc Af Amer: 22 mL/min — ABNORMAL LOW (ref >60)
GFR calc non Af Amer: 19 mL/min — ABNORMAL LOW (ref >60)
Glucose, Bld: 119 mg/dL — ABNORMAL HIGH (ref 70–99)
Potassium: 5.3 mmol/L — ABNORMAL HIGH (ref 3.5–5.1)
Sodium: 141 mmol/L (ref 135–145)
Total Bilirubin: 2.7 mg/dL — ABNORMAL HIGH (ref 0.3–1.2)
Total Protein: 7.3 g/dL (ref 6.5–8.1)

## 2019-03-13 LAB — URINALYSIS, ROUTINE W REFLEX MICROSCOPIC
Bacteria, UA: NONE SEEN
Bilirubin Urine: NEGATIVE
Glucose, UA: NEGATIVE mg/dL
Hgb urine dipstick: NEGATIVE
Ketones, ur: NEGATIVE mg/dL
Leukocytes,Ua: NEGATIVE
Nitrite: NEGATIVE
Protein, ur: 30 mg/dL — AB
Specific Gravity, Urine: 1.017 (ref 1.005–1.030)
pH: 8 (ref 5.0–8.0)

## 2019-03-13 LAB — APTT: aPTT: 68 seconds — ABNORMAL HIGH (ref 24–36)

## 2019-03-13 MED ORDER — PROMETHAZINE HCL 25 MG/ML IJ SOLN: 6.2500 mg | Freq: Four times a day (QID) | INTRAMUSCULAR | Status: DC | PRN

## 2019-03-13 MED ORDER — ONDANSETRON HCL 4 MG/2ML IJ SOLN
4.0000 mg | INTRAMUSCULAR | Status: DC | PRN
  Administered 2019-03-13: 4 mg via INTRAVENOUS
  Filled 2019-03-13: qty 2

## 2019-03-13 MED ORDER — PROMETHAZINE HCL 25 MG/ML IJ SOLN
12.5000 mg | Freq: Four times a day (QID) | INTRAMUSCULAR | Status: DC | PRN
  Administered 2019-03-13: 12.5 mg via INTRAVENOUS
  Filled 2019-03-13: qty 1

## 2019-03-13 NOTE — Progress Notes (Signed)
Patient had large loose BM - MD and PA made aware.  PA ok'd small bites of ice chips with small amount of ginger ale for flavor

## 2019-03-13 NOTE — Progress Notes (Signed)
Marland Kitchen  PROGRESS NOTE    Derek [name]  XTG:[id number] DOB: 10-05-1940 DOA: 03/12/2019 PCP: Sharilyn Sites, MD   Brief Narrative:   Derek [name] is a 78 y.o. male with medical history significant of Crohn's disease, colon CA s/p resection. Pt reports that last night he started having significant nausea. He vomited several times. He reports abdominal pain started then as well. It is global in nature and sharp. Pressure and eating make his symptoms worse. Nothing has made it better. His nausea would come in wave, but his pain has been fairly persistent. He became concern and spoke with his GI service. They recommended that he come to the ED.    Assessment & Plan:   Principal Problem:   SBO (small bowel obstruction) (HCC) Active Problems:   POST TRAUMATIC STRESS SYNDROME   Ascending aortic aneurysm (HCC)   CKD (chronic kidney disease) stage 3, GFR 30-59 ml/min (HCC)   Essential hypertension   Crohn's disease of colon with complication (HCC)   AKI (acute kidney injury) ([place])   Elevated LFTs   SBO     - as seen on CT; ?adhesions     - history of recurrent SBO     - admit inpt, med-tele     - general surgery onboard, appreciate assistance     - SBO protocol w/ NGT     - try to limit narcotics as much as possible  AKI on CKD3     - fluids, follow up renal function panel     - watch nephrotoxins     - UA pending     - SCr about stable on fluids; states that this may be his baseline, watch him on fluids today, speak with nephro if unimproved in AM  Elevated LFTs     - hep panel pending     - Hx of Hep C     - CT ab/pelvis noted     - chronic elevation of alk phos     - consider GI input  HTN     - resume home meds when able     - metprolol IV q6h onboard  A fib     - on eliquis, amiodarone     - for now, cover w/ metoprolol IV q6h     - can't have PO meds right now, will cover [facility] w/ heparin gtt     - plt drop on heparin; heparin held (spoke with pharm); HITT ab  ordered  Ascending aortic aneurysm     - CT chest noted; continue imaging surveillance outpt.   PTSD     - resume paxil when able  Resume home meds when cleared for PO. Large BM today. Seen walking room/hallway. Appreciate surgery assistance.   DVT prophylaxis: SCDs Code Status: FULL Family Communication: None at bedside.   Disposition Plan: TBD  Consultants:   General Surgery    ROS:  Reports improvement in ab pain; Denies CP/N/V . Remainder 10-pt ROS is negative for all not previously mentioned.  Subjective: "Well, it's in."  Objective: Vitals:   03/12/19 1800 03/12/19 2020 03/12/19 2022 03/13/19 0327  BP: 123/85 123/78  115/72  Pulse: 95 82  67  Resp: 16 20  (!) 24  Temp:  97.7 F (36.5 C)  98.2 F (36.8 C)  TempSrc:  Oral  Oral  SpO2: 95% 95%  94%  Weight: 87.1 kg     Height:   6' (1.829 m)     Intake/Output  Summary (Last 24 hours) at 03/13/2019 1534 Last data filed at 03/13/2019 1500 Gross per 24 hour  Intake 3015.4 ml  Output 2100 ml  Net 915.4 ml   Filed Weights   03/12/19 1800  Weight: 87.1 kg    Examination:  General: 78 y.o. male resting in bed in NAD Eyes: PERRL, normal sclera ENMT: Nares patent w/o discharge, orophaynx clear, dentition normal, ears w/o discharge/lesions/ulcers Cardiovascular: RRR, +S1, S2, no m/g/r, equal pulses throughout Respiratory: CTABL, no w/r/r, normal WOB GI: BS+, slightly distending, TTP improved from yesterday, no masses noted, no organomegaly noted, NGT in place MSK: No e/c/c Skin: No rashes, bruises, ulcerations noted Neuro: A&O x 3, no focal deficits Psyc: Appropriate interaction and affect, calm/cooperative   Data Reviewed: I have personally reviewed following labs and imaging studies.  CBC: Recent Labs  Lab 03/12/19 1152 03/13/19 0229  WBC 11.9* 9.6  HGB 17.4* 15.3  HCT 53.9* 50.2  MCV 91.2 93.7  PLT 170 591*   Basic Metabolic Panel: Recent Labs  Lab 03/12/19 1152 03/13/19 0229  NA 146*  141  K 4.6 5.3*  CL 91* 89*  CO2 36* 34*  GLUCOSE 134* 119*  BUN 29* 33*  CREATININE 2.98* 3.04*  CALCIUM 10.5* 8.7*   GFR: Estimated Creatinine Clearance: 22.7 mL/min (A) (by C-G formula based on SCr of 3.04 mg/dL (H)). Liver Function Tests: Recent Labs  Lab 03/12/19 1152 03/13/19 0229  AST 110* 158*  ALT 127* 174*  ALKPHOS 195* 158*  BILITOT 1.8* 2.7*  PROT 9.0* 7.3  ALBUMIN 5.2* 4.1   Recent Labs  Lab 03/12/19 1152  LIPASE 44   No results for input(s): AMMONIA in the last 168 hours. Coagulation Profile: No results for input(s): INR, PROTIME in the last 168 hours. Cardiac Enzymes: No results for input(s): CKTOTAL, CKMB, CKMBINDEX, TROPONINI in the last 168 hours. BNP (last 3 results) No results for input(s): PROBNP in the last 8760 hours. HbA1C: No results for input(s): HGBA1C in the last 72 hours. CBG: No results for input(s): GLUCAP in the last 168 hours. Lipid Profile: No results for input(s): CHOL, HDL, LDLCALC, TRIG, CHOLHDL, LDLDIRECT in the last 72 hours. Thyroid Function Tests: No results for input(s): TSH, T4TOTAL, FREET4, T3FREE, THYROIDAB in the last 72 hours. Anemia Panel: No results for input(s): VITAMINB12, FOLATE, FERRITIN, TIBC, IRON, RETICCTPCT in the last 72 hours. Sepsis Labs: No results for input(s): PROCALCITON, LATICACIDVEN in the last 168 hours.  Recent Results (from the past 240 hour(s))  SARS Coronavirus 2 [facility] order, Performed in [facility] hospital lab) Nasopharyngeal Nasopharyngeal Swab     Status: None   Collection Time: 03/12/19  1:42 PM   Specimen: Nasopharyngeal Swab  Result Value Ref Range Status   SARS Coronavirus 2 NEGATIVE NEGATIVE Final    Comment: (NOTE) If result is NEGATIVE SARS-CoV-2 target nucleic acids are NOT DETECTED. The SARS-CoV-2 RNA is generally detectable in upper and lower  respiratory specimens during the acute phase of infection. The lowest  concentration of SARS-CoV-2 viral copies this assay can  detect is 250  copies / mL. A negative result does not preclude SARS-CoV-2 infection  and should not be used as the sole basis for treatment or other  patient management decisions.  A negative result may occur with  improper specimen collection / handling, submission of specimen other  than nasopharyngeal swab, presence of viral mutation(s) within the  areas targeted by this assay, and inadequate number of viral copies  (<250 copies / mL). A  negative result must be combined with clinical  observations, patient history, and epidemiological information. If result is POSITIVE SARS-CoV-2 target nucleic acids are DETECTED. The SARS-CoV-2 RNA is generally detectable in upper and lower  respiratory specimens dur ing the acute phase of infection.  Positive  results are indicative of active infection with SARS-CoV-2.  Clinical  correlation with patient history and other diagnostic information is  necessary to determine patient infection status.  Positive results do  not rule out bacterial infection or co-infection with other viruses. If result is PRESUMPTIVE POSTIVE SARS-CoV-2 nucleic acids MAY BE PRESENT.   A presumptive positive result was obtained on the submitted specimen  and confirmed on repeat testing.  While 2019 novel coronavirus  (SARS-CoV-2) nucleic acids may be present in the submitted sample  additional confirmatory testing may be necessary for epidemiological  and / or clinical management purposes  to differentiate between  SARS-CoV-2 and other Sarbecovirus currently known to infect humans.  If clinically indicated additional testing with an alternate test  methodology [phone]) is advised. The SARS-CoV-2 RNA is generally  detectable in upper and lower respiratory sp ecimens during the acute  phase of infection. The expected result is Negative. Fact Sheet for Patients:  StrictlyIdeas.no Fact Sheet for Healthcare  Providers: BankingDealers.co.za This test is not yet approved or cleared by the [place] FDA and has been authorized for detection and/or diagnosis of SARS-CoV-2 by FDA under an Emergency Use Authorization (EUA).  This EUA will remain in effect (meaning this test can be used) for the duration of the COVID-19 declaration under Section 564(b)(1) of the Act, 21 U.S.C. section 360bbb-3(b)(1), unless the authorization is terminated or revoked sooner. Performed at [facility], [place] [address] [place], [place] [id number]       Radiology Studies: Ct Abdomen Pelvis Wo Contrast  Result Date: 03/12/2019 CLINICAL DATA:  Abdominal pain with nausea and vomiting. Concern for small bowel obstruction. EXAM: CT CHEST, ABDOMEN AND PELVIS WITHOUT CONTRAST TECHNIQUE: Multidetector CT imaging of the chest, abdomen and pelvis was performed following the standard protocol without IV contrast. COMPARISON:  06/03/2017. FINDINGS: CT CHEST FINDINGS Cardiovascular: Aortic atherosclerosis. Mild cardiomegaly, with LAD coronary artery atherosclerosis. Ascending aortic dilatation including at 4.2 cm on 32/2, similar. Tortuosity. Mediastinum/Nodes: No mediastinal or definite hilar adenopathy, given limitations of unenhanced CT. Lungs/Pleura: No pleural fluid. Left base subsegmental atelectasis or scar. A nodule along the left major fissure is similar at 4 mm and can be presumed benign. There is also a perifissural 2 mm right upper lobe pulmonary nodule which is unchanged. Calcified left upper lobe granuloma. Musculoskeletal: Left shoulder arthroplasty. Lower thoracic spondylosis. CT ABDOMEN PELVIS FINDINGS Hepatobiliary: Caudate lobe enlargement is nonspecific. Old granulomatous disease within. Cholecystectomy, without biliary ductal dilatation. Pancreas: Fatty replacement involving the pancreatic head and less so body. Spleen: Normal in size, without focal abnormality. Adrenals/Urinary  Tract: Normal adrenal glands. Mild bilateral renal cortical thinning. Low-density bilateral renal lesions are likely cysts. Other lesions are too small to characterize. Punctate right renal collecting system calculi. No hydronephrosis. A left pelvic calcification is positioned adjacent to the left ureter on 115/2. No bladder calculi. Stomach/Bowel: Stomach is contrast filled. The colon is normal to decompressed caliber. Right hemicolectomy. Proximal small bowel loops are fluid-filled and measure up to 4.0 cm. A "small bowel feces sign" is identified on 93/2 at the site of a transition to decompressed bowel. No mass in this area. No complicating ischemia. Vascular/Lymphatic: Aortic and branch vessel atherosclerosis. No abdominopelvic adenopathy. Reproductive:  Normal prostate. Other: No significant free fluid. No free intraperitoneal air. No evidence of omental or peritoneal disease. Musculoskeletal: Mild osteopenia. Presumed bone islands within the pelvis. IMPRESSION: 1. Small bowel obstruction within the mid ileum, likely due to underlying adhesions. No complicating ischemia. 2. Status post right hemicolectomy. No noncontrast CT findings of metastatic disease in the abdomen or pelvis. 3.  No acute process in the chest. 4. Coronary artery atherosclerosis. Aortic Atherosclerosis (ICD10-I70.0). 5. Right nephrolithiasis. 6. Similar mild ascending aortic dilatation. Recommend annual imaging followup by CTA or MRA. This recommendation follows 2010 ACCF/AHA/AATS/ACR/ASA/SCA/SCAI/SIR/STS/SVM Guidelines for the Diagnosis and Management of Patients with Thoracic Aortic Disease. Circulation. 2010; 121: M426-S341. Aortic aneurysm NOS (ICD10-I71.9). Electronically Signed   By: Abigail Miyamoto M.D.   On: 03/12/2019 15:59   Ct Chest Wo Contrast  Result Date: 03/12/2019 CLINICAL DATA:  Abdominal pain with nausea and vomiting. Concern for small bowel obstruction. EXAM: CT CHEST, ABDOMEN AND PELVIS WITHOUT CONTRAST TECHNIQUE:  Multidetector CT imaging of the chest, abdomen and pelvis was performed following the standard protocol without IV contrast. COMPARISON:  06/03/2017. FINDINGS: CT CHEST FINDINGS Cardiovascular: Aortic atherosclerosis. Mild cardiomegaly, with LAD coronary artery atherosclerosis. Ascending aortic dilatation including at 4.2 cm on 32/2, similar. Tortuosity. Mediastinum/Nodes: No mediastinal or definite hilar adenopathy, given limitations of unenhanced CT. Lungs/Pleura: No pleural fluid. Left base subsegmental atelectasis or scar. A nodule along the left major fissure is similar at 4 mm and can be presumed benign. There is also a perifissural 2 mm right upper lobe pulmonary nodule which is unchanged. Calcified left upper lobe granuloma. Musculoskeletal: Left shoulder arthroplasty. Lower thoracic spondylosis. CT ABDOMEN PELVIS FINDINGS Hepatobiliary: Caudate lobe enlargement is nonspecific. Old granulomatous disease within. Cholecystectomy, without biliary ductal dilatation. Pancreas: Fatty replacement involving the pancreatic head and less so body. Spleen: Normal in size, without focal abnormality. Adrenals/Urinary Tract: Normal adrenal glands. Mild bilateral renal cortical thinning. Low-density bilateral renal lesions are likely cysts. Other lesions are too small to characterize. Punctate right renal collecting system calculi. No hydronephrosis. A left pelvic calcification is positioned adjacent to the left ureter on 115/2. No bladder calculi. Stomach/Bowel: Stomach is contrast filled. The colon is normal to decompressed caliber. Right hemicolectomy. Proximal small bowel loops are fluid-filled and measure up to 4.0 cm. A "small bowel feces sign" is identified on 93/2 at the site of a transition to decompressed bowel. No mass in this area. No complicating ischemia. Vascular/Lymphatic: Aortic and branch vessel atherosclerosis. No abdominopelvic adenopathy. Reproductive: Normal prostate. Other: No significant free fluid.  No free intraperitoneal air. No evidence of omental or peritoneal disease. Musculoskeletal: Mild osteopenia. Presumed bone islands within the pelvis. IMPRESSION: 1. Small bowel obstruction within the mid ileum, likely due to underlying adhesions. No complicating ischemia. 2. Status post right hemicolectomy. No noncontrast CT findings of metastatic disease in the abdomen or pelvis. 3.  No acute process in the chest. 4. Coronary artery atherosclerosis. Aortic Atherosclerosis (ICD10-I70.0). 5. Right nephrolithiasis. 6. Similar mild ascending aortic dilatation. Recommend annual imaging followup by CTA or MRA. This recommendation follows 2010 ACCF/AHA/AATS/ACR/ASA/SCA/SCAI/SIR/STS/SVM Guidelines for the Diagnosis and Management of Patients with Thoracic Aortic Disease. Circulation. 2010; 121: D622-W979. Aortic aneurysm NOS (ICD10-I71.9). Electronically Signed   By: Abigail Miyamoto M.D.   On: 03/12/2019 15:59   Dg Abd Portable 1v-small Bowel Obstruction Protocol-initial, 8 Hr Delay  Result Date: 03/13/2019 CLINICAL DATA:  8 hour delay.  Small bowel obstruction EXAM: PORTABLE ABDOMEN - 1 VIEW COMPARISON:  CT from yesterday FINDINGS: Oral contrast is only  seen within the gastric fundus. There is moderate gaseous distention of the stomach and proximal small bowel. No colonic distension. IMPRESSION: Continued small bowel obstruction. Oral contrast is only seen in the proximal stomach. Electronically Signed   By: Monte Fantasia M.D.   On: 03/13/2019 07:30   Dg Abd Portable 1v-small Bowel Protocol-position Verification  Result Date: 03/12/2019 CLINICAL DATA:  NG tube placement. Small bowel obstruction. EXAM: PORTABLE ABDOMEN - 1 VIEW COMPARISON:  May 13, 2016 FINDINGS: Enteric catheter with tip overlying the expected location of gastric body/gastric cardia. Nonspecific bowel gas pattern. Postsurgical changes in the upper abdomen. Vascular calcifications noted. IMPRESSION: Enteric catheter with tip overlying the  expected location of gastric body/gastric cardia. Electronically Signed   By: Fidela Salisbury M.D.   On: 03/12/2019 19:48     Scheduled Meds:  metoprolol tartrate  2.5 mg Intravenous Q6H   sodium chloride flush  3 mL Intravenous Q12H   Continuous Infusions:  sodium chloride 125 mL/hr at 03/13/19 1148     LOS: 1 day    Time spent: 25 minutes spent in the coordination of care.    Jonnie Finner, DO [facility] Pager [phone]  If 7PM-7AM, please contact night-coverage [email or web address] Password [facility] 03/13/2019, 3:34 PM

## 2019-03-13 NOTE — Progress Notes (Signed)
[facility] Progress Note     Subjective: CC-  Patient states that he feels horrible. Abdomen less distended but he complains of severe nausea. Vomited once this morning despite NG tube. Output from NG appears rather thick, 1700cc recorded. No flatus. Last BM 8/8. At baseline he reports having chronic diarrhea. Xray this AM shows persistent SBO with gastrograffin only in the proximal stomach.  Objective: Vital signs in last 24 hours: Temp:  [97.5 F (36.4 C)-98.2 F (36.8 C)] 98.2 F (36.8 C) (08/10 0327) Pulse Rate:  [67-99] 67 (08/10 0327) Resp:  [14-24] 24 (08/10 0327) BP: (115-142)/(72-89) 115/72 (08/10 0327) SpO2:  [94 %-99 %] 94 % (08/10 0327) Weight:  [87.1 kg] 87.1 kg (08/09 1800) Last BM Date: 03/11/19  Intake/Output from previous day: 08/09 0701 - 08/10 0700 In: 1823.8 [I.V.:1323.8; IV Piggyback:500] Out: 1700 [Emesis/NG output:1700] Intake/Output this shift: No intake/output data recorded.  PE: Gen:  Alert, NAD, pleasant HEENT: EOM's intact, pupils round but unequal in size Pulm:  Rate and effort normal Abd: Soft, nondistended, nontender, +BS, no HSM, no hernia Psych: A&Ox3  Skin: no rashes noted, warm and dry  Lab Results:  Recent Labs    03/12/19 1152 03/13/19 0229  WBC 11.9* 9.6  HGB 17.4* 15.3  HCT 53.9* 50.2  PLT 170 118*   BMET Recent Labs    03/12/19 1152 03/13/19 0229  NA 146* 141  K 4.6 5.3*  CL 91* 89*  CO2 36* 34*  GLUCOSE 134* 119*  BUN 29* 33*  CREATININE 2.98* 3.04*  CALCIUM 10.5* 8.7*   PT/INR No results for input(s): LABPROT, INR in the last 72 hours. CMP     Component Value Date/Time   NA 141 03/13/2019 0229   NA 143 05/02/2018 1546   NA 139 11/23/2012 1047   K 5.3 (H) 03/13/2019 0229   K 4.5 11/23/2012 1047   CL 89 (L) 03/13/2019 0229   CL 103 11/23/2012 1047   CO2 34 (H) 03/13/2019 0229   CO2 26 11/23/2012 1047   GLUCOSE 119 (H) 03/13/2019 0229   GLUCOSE 91 11/23/2012 1047   BUN 33 (H) 03/13/2019  0229   BUN 17 05/02/2018 1546   BUN 25.3 11/23/2012 1047   CREATININE 3.04 (H) 03/13/2019 0229   CREATININE 1.8 (H) 09/25/2015 1020   CREATININE 1.9 (H) 11/23/2012 1047   CALCIUM 8.7 (L) 03/13/2019 0229   CALCIUM 8.8 11/23/2012 1047   PROT 7.3 03/13/2019 0229   PROT 6.5 05/02/2018 1546   PROT 6.7 11/23/2012 1047   ALBUMIN 4.1 03/13/2019 0229   ALBUMIN 4.2 05/02/2018 1546   ALBUMIN 3.4 (L) 11/23/2012 1047   AST 158 (H) 03/13/2019 0229   AST 28 11/23/2012 1047   ALT 174 (H) 03/13/2019 0229   ALT 43 11/23/2012 1047   ALKPHOS 158 (H) 03/13/2019 0229   ALKPHOS 150 11/23/2012 1047   BILITOT 2.7 (H) 03/13/2019 0229   BILITOT 0.6 05/02/2018 1546   BILITOT 0.74 11/23/2012 1047   GFRNONAA 19 (L) 03/13/2019 0229   GFRAA 22 (L) 03/13/2019 0229   Lipase     Component Value Date/Time   LIPASE 44 03/12/2019 1152       Studies/Results: Ct Abdomen Pelvis Wo Contrast  Result Date: 03/12/2019 CLINICAL DATA:  Abdominal pain with nausea and vomiting. Concern for small bowel obstruction. EXAM: CT CHEST, ABDOMEN AND PELVIS WITHOUT CONTRAST TECHNIQUE: Multidetector CT imaging of the chest, abdomen and pelvis was performed following the standard protocol without IV contrast. COMPARISON:  06/03/2017.  FINDINGS: CT CHEST FINDINGS Cardiovascular: Aortic atherosclerosis. Mild cardiomegaly, with LAD coronary artery atherosclerosis. Ascending aortic dilatation including at 4.2 cm on 32/2, similar. Tortuosity. Mediastinum/Nodes: No mediastinal or definite hilar adenopathy, given limitations of unenhanced CT. Lungs/Pleura: No pleural fluid. Left base subsegmental atelectasis or scar. A nodule along the left major fissure is similar at 4 mm and can be presumed benign. There is also a perifissural 2 mm right upper lobe pulmonary nodule which is unchanged. Calcified left upper lobe granuloma. Musculoskeletal: Left shoulder arthroplasty. Lower thoracic spondylosis. CT ABDOMEN PELVIS FINDINGS Hepatobiliary: Caudate  lobe enlargement is nonspecific. Old granulomatous disease within. Cholecystectomy, without biliary ductal dilatation. Pancreas: Fatty replacement involving the pancreatic head and less so body. Spleen: Normal in size, without focal abnormality. Adrenals/Urinary Tract: Normal adrenal glands. Mild bilateral renal cortical thinning. Low-density bilateral renal lesions are likely cysts. Other lesions are too small to characterize. Punctate right renal collecting system calculi. No hydronephrosis. A left pelvic calcification is positioned adjacent to the left ureter on 115/2. No bladder calculi. Stomach/Bowel: Stomach is contrast filled. The colon is normal to decompressed caliber. Right hemicolectomy. Proximal small bowel loops are fluid-filled and measure up to 4.0 cm. A "small bowel feces sign" is identified on 93/2 at the site of a transition to decompressed bowel. No mass in this area. No complicating ischemia. Vascular/Lymphatic: Aortic and branch vessel atherosclerosis. No abdominopelvic adenopathy. Reproductive: Normal prostate. Other: No significant free fluid. No free intraperitoneal air. No evidence of omental or peritoneal disease. Musculoskeletal: Mild osteopenia. Presumed bone islands within the pelvis. IMPRESSION: 1. Small bowel obstruction within the mid ileum, likely due to underlying adhesions. No complicating ischemia. 2. Status post right hemicolectomy. No noncontrast CT findings of metastatic disease in the abdomen or pelvis. 3.  No acute process in the chest. 4. Coronary artery atherosclerosis. Aortic Atherosclerosis (ICD10-I70.0). 5. Right nephrolithiasis. 6. Similar mild ascending aortic dilatation. Recommend annual imaging followup by CTA or MRA. This recommendation follows 2010 ACCF/AHA/AATS/ACR/ASA/SCA/SCAI/SIR/STS/SVM Guidelines for the Diagnosis and Management of Patients with Thoracic Aortic Disease. Circulation. 2010; 121: Z610-R604. Aortic aneurysm NOS (ICD10-I71.9). Electronically Signed    By: Abigail Miyamoto M.D.   On: 03/12/2019 15:59   Ct Chest Wo Contrast  Result Date: 03/12/2019 CLINICAL DATA:  Abdominal pain with nausea and vomiting. Concern for small bowel obstruction. EXAM: CT CHEST, ABDOMEN AND PELVIS WITHOUT CONTRAST TECHNIQUE: Multidetector CT imaging of the chest, abdomen and pelvis was performed following the standard protocol without IV contrast. COMPARISON:  06/03/2017. FINDINGS: CT CHEST FINDINGS Cardiovascular: Aortic atherosclerosis. Mild cardiomegaly, with LAD coronary artery atherosclerosis. Ascending aortic dilatation including at 4.2 cm on 32/2, similar. Tortuosity. Mediastinum/Nodes: No mediastinal or definite hilar adenopathy, given limitations of unenhanced CT. Lungs/Pleura: No pleural fluid. Left base subsegmental atelectasis or scar. A nodule along the left major fissure is similar at 4 mm and can be presumed benign. There is also a perifissural 2 mm right upper lobe pulmonary nodule which is unchanged. Calcified left upper lobe granuloma. Musculoskeletal: Left shoulder arthroplasty. Lower thoracic spondylosis. CT ABDOMEN PELVIS FINDINGS Hepatobiliary: Caudate lobe enlargement is nonspecific. Old granulomatous disease within. Cholecystectomy, without biliary ductal dilatation. Pancreas: Fatty replacement involving the pancreatic head and less so body. Spleen: Normal in size, without focal abnormality. Adrenals/Urinary Tract: Normal adrenal glands. Mild bilateral renal cortical thinning. Low-density bilateral renal lesions are likely cysts. Other lesions are too small to characterize. Punctate right renal collecting system calculi. No hydronephrosis. A left pelvic calcification is positioned adjacent to the left ureter on 115/2. No  bladder calculi. Stomach/Bowel: Stomach is contrast filled. The colon is normal to decompressed caliber. Right hemicolectomy. Proximal small bowel loops are fluid-filled and measure up to 4.0 cm. A "small bowel feces sign" is identified on 93/2  at the site of a transition to decompressed bowel. No mass in this area. No complicating ischemia. Vascular/Lymphatic: Aortic and branch vessel atherosclerosis. No abdominopelvic adenopathy. Reproductive: Normal prostate. Other: No significant free fluid. No free intraperitoneal air. No evidence of omental or peritoneal disease. Musculoskeletal: Mild osteopenia. Presumed bone islands within the pelvis. IMPRESSION: 1. Small bowel obstruction within the mid ileum, likely due to underlying adhesions. No complicating ischemia. 2. Status post right hemicolectomy. No noncontrast CT findings of metastatic disease in the abdomen or pelvis. 3.  No acute process in the chest. 4. Coronary artery atherosclerosis. Aortic Atherosclerosis (ICD10-I70.0). 5. Right nephrolithiasis. 6. Similar mild ascending aortic dilatation. Recommend annual imaging followup by CTA or MRA. This recommendation follows 2010 ACCF/AHA/AATS/ACR/ASA/SCA/SCAI/SIR/STS/SVM Guidelines for the Diagnosis and Management of Patients with Thoracic Aortic Disease. Circulation. 2010; 121: C163-A453. Aortic aneurysm NOS (ICD10-I71.9). Electronically Signed   By: Abigail Miyamoto M.D.   On: 03/12/2019 15:59   Dg Abd Portable 1v-small Bowel Obstruction Protocol-initial, 8 Hr Delay  Result Date: 03/13/2019 CLINICAL DATA:  8 hour delay.  Small bowel obstruction EXAM: PORTABLE ABDOMEN - 1 VIEW COMPARISON:  CT from yesterday FINDINGS: Oral contrast is only seen within the gastric fundus. There is moderate gaseous distention of the stomach and proximal small bowel. No colonic distension. IMPRESSION: Continued small bowel obstruction. Oral contrast is only seen in the proximal stomach. Electronically Signed   By: Monte Fantasia M.D.   On: 03/13/2019 07:30   Dg Abd Portable 1v-small Bowel Protocol-position Verification  Result Date: 03/12/2019 CLINICAL DATA:  NG tube placement. Small bowel obstruction. EXAM: PORTABLE ABDOMEN - 1 VIEW COMPARISON:  May 13, 2016  FINDINGS: Enteric catheter with tip overlying the expected location of gastric body/gastric cardia. Nonspecific bowel gas pattern. Postsurgical changes in the upper abdomen. Vascular calcifications noted. IMPRESSION: Enteric catheter with tip overlying the expected location of gastric body/gastric cardia. Electronically Signed   By: Fidela Salisbury M.D.   On: 03/12/2019 19:48    Anti-infectives: Anti-infectives (From admission, onward)   None       Assessment/Plan History of Crohn's disease History of transverse colon cancer: Resected - 08/14/2016 - 1.2 cm adenoca, 0.7 nodes (T2, N0) Paroxysmal A fib on Eliquis - medicine team switching to heparin gtt Thoracic aortic aneurysm followed by Dr. Servando Snare AKI on CKD-III - followed by Dr. Jimmy Footman. Cr 3.04. U/a pending History of PE HTN Nephrolithiasis Post traumatic stress syndrome Covid-19 negative Elevated LFTs - h/o Hep C, hepatitis panel pending  SBO - multiple prior abdominal surgeries: Remote ileocectomy for Crohn's disease around 1978, then required reoperation soon after that; Transverse colectomy 08/14/2016 for transverse colon cancer ---> right hemicolectomy and ileostomy for an ischemic leak 08/27/2016 Dr. [name]; Ileostomy reversal 07/23/2017 Dr. [name] - CT scan 8/9 shows Small bowel obstruction within the mid ileum, likely due to underlying adhesions - small bowel protocol started 8/9  ID - none FEN - IVF, NPO/NGT to LIWS VTE - SCDs, heparin gtt Foley - none Follow up - TBD Contact - wife Noha Karasik [phone], [phone]  Plan: NG tube with high output and no return in bowel function. Xray shows persistent SBO and contrast only into the proximal stomach. Continue NG tube to LIWS. NG output appears thick and may be getting clogged, will ask nurse  to flush with air q 4 hours. Mobilize. Repeat xray and labs in the AM.   LOS: 1 day    Wellington Hampshire , [facility] 03/13/2019, 8:34 AM Pager:  [phone] Mon-Thurs 7:00 am-4:30 pm Fri 7:00 am -11:30 AM Sat-Sun 7:00 am-11:30 am
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
## 2019-03-13 NOTE — Progress Notes (Signed)
[place] for heparin drip Indication: atrial fibrillation  Allergies  Allergen Reactions  . Lorazepam Other (See Comments)    Reaction:  Hallucinations   . Humira [Adalimumab] Other (See Comments)    Pt states that he got pancreatitis.    . Quinolones     Patient was warned about not using Cipro and similar antibiotics. Recent studies have raised concern that fluoroquinolone antibiotics could be associated with an increased risk of aortic aneurysm Fluoroquinolones have non-antimicrobial properties that might jeopardise the integrity of the extracellular matrix of the vascular wall In a  propensity score matched cohort study in [place], there was a 66% increased rate of aortic aneurysm or dissection associated with oral fluoroquinolone use, compared wit    Patient Measurements: Height: 6' (182.9 cm) Weight: 192 lb (87.1 kg) IBW/kg (Calculated) : 77.6 Heparin Dosing Weight:   Vital Signs: Temp: 98.2 F (36.8 C) (08/10 0327) Temp Source: Oral (08/10 0327) BP: 115/72 (08/10 0327) Pulse Rate: 67 (08/10 0327)  Labs: Recent Labs    03/12/19 1152 03/12/19 1804 03/13/19 0229  HGB 17.4*  --  15.3  HCT 53.9*  --  50.2  PLT 170  --  118*  APTT  --  35 68*  HEPARINUNFRC  --  >2.20*  --   CREATININE 2.98*  --  3.04*    Estimated Creatinine Clearance: 22.7 mL/min (A) (by C-G formula based on SCr of 3.04 mg/dL (H)).   Assessment: 44 yoM with PMH Afib on Eliquis, Crohn's disease, colon CA s/p resection. Pt reports that last night he started having significant nausea. He vomited several times. Admitted with SBO and Eliquis converted to heparin drip.   Baseline INR not done, aPTT WNL  Prior anticoagulation: Eliquis 5 mg bid, LD 8/8  Significant events:  Today, 03/13/2019:  CBC: Hgb WNL; Plt with marked decrease (30%) overnight  Most recent aPTT therapeutic on 1200 units/hr  No bleeding or infusion issues per nursing  Low 4T score  (timeline not c/w HIT and no recent heparin exposure) but MD would like to r/o HIT - checking HIT Ab panel  Goal of Therapy: Heparin level 0.3-0.7 units/ml Monitor platelets by anticoagulation protocol: Yes  Plan:  Hold heparin while HIT Ab pending, subsequent anticoag will depend on OD and 4T score   Reuel Boom, PharmD, BCPS [phone] 03/13/2019, 12:18 PM

## 2019-03-14 ENCOUNTER — Inpatient Hospital Stay (HOSPITAL_COMMUNITY): Payer: No Typology Code available for payment source

## 2019-03-14 LAB — COMPREHENSIVE METABOLIC PANEL
ALT: 177 U/L — ABNORMAL HIGH (ref 0–44)
AST: 119 U/L — ABNORMAL HIGH (ref 15–41)
Albumin: 3.6 g/dL (ref 3.5–5.0)
Alkaline Phosphatase: 122 U/L (ref 38–126)
Anion gap: 12 (ref 5–15)
BUN: 39 mg/dL — ABNORMAL HIGH (ref 8–23)
CO2: 30 mmol/L (ref 22–32)
Calcium: 7.7 mg/dL — ABNORMAL LOW (ref 8.9–10.3)
Chloride: 99 mmol/L (ref 98–111)
Creatinine, Ser: 2.58 mg/dL — ABNORMAL HIGH (ref 0.61–1.24)
GFR calc Af Amer: 27 mL/min — ABNORMAL LOW (ref >60)
GFR calc non Af Amer: 23 mL/min — ABNORMAL LOW (ref >60)
Glucose, Bld: 112 mg/dL — ABNORMAL HIGH (ref 70–99)
Potassium: 3.8 mmol/L (ref 3.5–5.1)
Sodium: 141 mmol/L (ref 135–145)
Total Bilirubin: 1.7 mg/dL — ABNORMAL HIGH (ref 0.3–1.2)
Total Protein: 6.5 g/dL (ref 6.5–8.1)

## 2019-03-14 LAB — CBC
HCT: 44.8 % (ref 39.0–52.0)
Hemoglobin: 13.5 g/dL (ref 13.0–17.0)
MCH: 29.2 pg (ref 26.0–34.0)
MCHC: 30.1 g/dL (ref 30.0–36.0)
MCV: 96.8 fL (ref 80.0–100.0)
Platelets: 108 10*3/uL — ABNORMAL LOW (ref 150–400)
RBC: 4.63 MIL/uL (ref 4.22–5.81)
RDW: 14.8 % (ref 11.5–15.5)
WBC: 6.4 10*3/uL (ref 4.0–10.5)
nRBC: 0 % (ref 0.0–0.2)

## 2019-03-14 LAB — HEPATITIS PANEL, ACUTE
HCV Ab: 0.2 s/co ratio (ref 0.0–0.9)
Hep A IgM: NEGATIVE
Hep B C IgM: NEGATIVE
Hepatitis B Surface Ag: NEGATIVE

## 2019-03-14 LAB — HEPARIN INDUCED PLATELET AB (HIT ANTIBODY): Heparin Induced Plt Ab: 0.16 OD (ref 0.000–0.400)

## 2019-03-14 LAB — MAGNESIUM: Magnesium: 2.1 mg/dL (ref 1.7–2.4)

## 2019-03-14 MED ORDER — APIXABAN 5 MG PO TABS: 5.0000 mg | ORAL_TABLET | Freq: Two times a day (BID) | ORAL | Status: DC

## 2019-03-14 MED ORDER — AMIODARONE HCL 200 MG PO TABS
200.0000 mg | ORAL_TABLET | Freq: Every day | ORAL | Status: DC
  Administered 2019-03-14: 200 mg via ORAL
  Filled 2019-03-14: qty 1

## 2019-03-14 MED ORDER — MAGNESIUM OXIDE 400 (241.3 MG) MG PO TABS
1200.0000 mg | ORAL_TABLET | Freq: Two times a day (BID) | ORAL | Status: DC
  Administered 2019-03-14: 1200 mg via ORAL
  Filled 2019-03-14: qty 3

## 2019-03-14 NOTE — Progress Notes (Signed)
Pt discharged home in stable condition. Discharge instructions given. Pt verbalized understanding. No immediate questions or concerns at this time. Pt discharged from unit via wheelchair.

## 2019-03-14 NOTE — Progress Notes (Signed)
[facility]  PROGRESS NOTE    Derek [name]  LSL:[id number] DOB: 1940-10-02 DOA: 03/12/2019 PCP: Sharilyn Sites, MD   Brief Narrative:   Gershom Brobeck Harrelsonis a 78 y.o.malewith medical history significant ofCrohn's disease, colon CA s/p resection. Pt reports that last night he started having significant nausea. He vomited several times. He reports abdominal pain started then as well. It is global in nature and sharp. Pressure and eating make his symptoms worse. Nothing has made it better. His nausea would come in wave, but his pain has been fairly persistent. He became concern and spoke with his GI service. They recommended that he come to the ED.   Assessment & Plan:   Principal Problem:   SBO (small bowel obstruction) (HCC) Active Problems:   POST TRAUMATIC STRESS SYNDROME   Ascending aortic aneurysm (HCC)   CKD (chronic kidney disease) stage 3, GFR 30-59 ml/min (HCC)   Essential hypertension   Crohn's disease of colon with complication (HCC)   AKI (acute kidney injury) ([place])   Elevated LFTs  SBO - as seen on CT; ?adhesions - history of recurrent SBO - admit inpt, med-tele - general surgery onboard, appreciate assistance - SBO protocol w/ NGT - try to limit narcotics as much as possible     - reports multiple BMs     - NGT d/c'd; started on CLD, advance as tolerated  AKI on CKD3 - fluids, follow up renal function panel - watch nephrotoxins - UA pending     - SCr about stable on fluids; states that this may be his baseline, watch him on fluids today, speak with nephro if unimproved in AM     - SCr improved to 2.5; continue fluids  Elevated LFTs - hep panel pending - Hx of Hep C - CT ab/pelvis noted - chronic elevation of alk phos     - consider GI input     - stable, monitor  HTN - BP ok; monitor  A fib - on eliquis, amiodarone - for now, cover w/ metoprolol IV q6h - can't have PO meds right  now, will cover [facility] w/ heparin gtt     - plt drop on heparin; heparin held (spoke with pharm); HITT ab ordered     - resume eliquis, amiodarone  Ascending aortic aneurysm - CT chest noted; continue imaging surveillance outpt.   PTSD - resume paxil  Resume home meds when cleared for PO. Large BM today. Seen walking room/hallway. Appreciate surgery assistance.   DVT prophylaxis: SCDs Code Status: FULL Family Communication: None at bedside.   Disposition Plan: To home when tolerating FLD  Consultants:   General Surgery    ROS:  Denies CP, dyspnea, N/V, ab pain; Denies CP/N/V . Remainder 10-pt ROS is negative for all not previously mentioned.  Subjective: "I'll poop right now if you want me to."  Objective: Vitals:   03/13/19 1933 03/14/19 0152 03/14/19 0440 03/14/19 1408  BP: 124/68 130/76 119/70 136/73  Pulse: 63 62 (!) 59 (!) 59  Resp: 20  18   Temp: 98 F (36.7 C)  98.1 F (36.7 C) 98 F (36.7 C)  TempSrc: Oral  Oral Oral  SpO2:   91% 100%  Weight:      Height:        Intake/Output Summary (Last 24 hours) at 03/14/2019 1430 Last data filed at 03/14/2019 1100 Gross per 24 hour  Intake 4290.94 ml  Output 1350 ml  Net 2940.94 ml   Filed Weights   03/12/19  1800  Weight: 87.1 kg    Examination:  General: 78 y.o. male resting in bed in NAD Eyes: PERRL, normal sclera ENMT: Nares patent w/o discharge, orophaynx clear, dentition normal, ears w/o discharge/lesions/ulcers Cardiovascular: RRR, +S1, S2, no m/g/r, equal pulses throughout Respiratory: CTABL, no w/r/r, normal WOB GI: BS+, slightly distended, NT, no masses noted, no organomegaly noted MSK: No e/c/c Skin: No rashes, bruises, ulcerations noted Neuro: A&O x 3, no focal deficits Psyc: Appropriate interaction and affect, calm/cooperative  Data Reviewed: I have personally reviewed following labs and imaging studies.  CBC: Recent Labs  Lab 03/12/19 1152 03/13/19 0229 03/14/19 0416  WBC  11.9* 9.6 6.4  HGB 17.4* 15.3 13.5  HCT 53.9* 50.2 44.8  MCV 91.2 93.7 96.8  PLT 170 118* 836*   Basic Metabolic Panel: Recent Labs  Lab 03/12/19 1152 03/13/19 0229 03/14/19 0416  NA 146* 141 141  K 4.6 5.3* 3.8  CL 91* 89* 99  CO2 36* 34* 30  GLUCOSE 134* 119* 112*  BUN 29* 33* 39*  CREATININE 2.98* 3.04* 2.58*  CALCIUM 10.5* 8.7* 7.7*  MG  --   --  2.1   GFR: Estimated Creatinine Clearance: 26.7 mL/min (A) (by C-G formula based on SCr of 2.58 mg/dL (H)). Liver Function Tests: Recent Labs  Lab 03/12/19 1152 03/13/19 0229 03/14/19 0416  AST 110* 158* 119*  ALT 127* 174* 177*  ALKPHOS 195* 158* 122  BILITOT 1.8* 2.7* 1.7*  PROT 9.0* 7.3 6.5  ALBUMIN 5.2* 4.1 3.6   Recent Labs  Lab 03/12/19 1152  LIPASE 44   No results for input(s): AMMONIA in the last 168 hours. Coagulation Profile: No results for input(s): INR, PROTIME in the last 168 hours. Cardiac Enzymes: No results for input(s): CKTOTAL, CKMB, CKMBINDEX, TROPONINI in the last 168 hours. BNP (last 3 results) No results for input(s): PROBNP in the last 8760 hours. HbA1C: No results for input(s): HGBA1C in the last 72 hours. CBG: No results for input(s): GLUCAP in the last 168 hours. Lipid Profile: No results for input(s): CHOL, HDL, LDLCALC, TRIG, CHOLHDL, LDLDIRECT in the last 72 hours. Thyroid Function Tests: No results for input(s): TSH, T4TOTAL, FREET4, T3FREE, THYROIDAB in the last 72 hours. Anemia Panel: No results for input(s): VITAMINB12, FOLATE, FERRITIN, TIBC, IRON, RETICCTPCT in the last 72 hours. Sepsis Labs: No results for input(s): PROCALCITON, LATICACIDVEN in the last 168 hours.  Recent Results (from the past 240 hour(s))  SARS Coronavirus 2 [facility] order, Performed in [facility] hospital lab) Nasopharyngeal Nasopharyngeal Swab     Status: None   Collection Time: 03/12/19  1:42 PM   Specimen: Nasopharyngeal Swab  Result Value Ref Range Status   SARS Coronavirus 2 NEGATIVE NEGATIVE  Final    Comment: (NOTE) If result is NEGATIVE SARS-CoV-2 target nucleic acids are NOT DETECTED. The SARS-CoV-2 RNA is generally detectable in upper and lower  respiratory specimens during the acute phase of infection. The lowest  concentration of SARS-CoV-2 viral copies this assay can detect is 250  copies / mL. A negative result does not preclude SARS-CoV-2 infection  and should not be used as the sole basis for treatment or other  patient management decisions.  A negative result may occur with  improper specimen collection / handling, submission of specimen other  than nasopharyngeal swab, presence of viral mutation(s) within the  areas targeted by this assay, and inadequate number of viral copies  (<250 copies / mL). A negative result must be combined with clinical  observations,  patient history, and epidemiological information. If result is POSITIVE SARS-CoV-2 target nucleic acids are DETECTED. The SARS-CoV-2 RNA is generally detectable in upper and lower  respiratory specimens dur ing the acute phase of infection.  Positive  results are indicative of active infection with SARS-CoV-2.  Clinical  correlation with patient history and other diagnostic information is  necessary to determine patient infection status.  Positive results do  not rule out bacterial infection or co-infection with other viruses. If result is PRESUMPTIVE POSTIVE SARS-CoV-2 nucleic acids MAY BE PRESENT.   A presumptive positive result was obtained on the submitted specimen  and confirmed on repeat testing.  While 2019 novel coronavirus  (SARS-CoV-2) nucleic acids may be present in the submitted sample  additional confirmatory testing may be necessary for epidemiological  and / or clinical management purposes  to differentiate between  SARS-CoV-2 and other Sarbecovirus currently known to infect humans.  If clinically indicated additional testing with an alternate test  methodology [phone]) is advised. The  SARS-CoV-2 RNA is generally  detectable in upper and lower respiratory sp ecimens during the acute  phase of infection. The expected result is Negative. Fact Sheet for Patients:  StrictlyIdeas.no Fact Sheet for Healthcare Providers: BankingDealers.co.za This test is not yet approved or cleared by the [place] FDA and has been authorized for detection and/or diagnosis of SARS-CoV-2 by FDA under an Emergency Use Authorization (EUA).  This EUA will remain in effect (meaning this test can be used) for the duration of the COVID-19 declaration under Section 564(b)(1) of the Act, 21 U.S.C. section 360bbb-3(b)(1), unless the authorization is terminated or revoked sooner. Performed at [facility], [place] [address], [place], [place] [address]       Radiology Studies: Ct Abdomen Pelvis Wo Contrast  Result Date: 03/12/2019 CLINICAL DATA:  Abdominal pain with nausea and vomiting. Concern for small bowel obstruction. EXAM: CT CHEST, ABDOMEN AND PELVIS WITHOUT CONTRAST TECHNIQUE: Multidetector CT imaging of the chest, abdomen and pelvis was performed following the standard protocol without IV contrast. COMPARISON:  06/03/2017. FINDINGS: CT CHEST FINDINGS Cardiovascular: Aortic atherosclerosis. Mild cardiomegaly, with LAD coronary artery atherosclerosis. Ascending aortic dilatation including at 4.2 cm on 32/2, similar. Tortuosity. Mediastinum/Nodes: No mediastinal or definite hilar adenopathy, given limitations of unenhanced CT. Lungs/Pleura: No pleural fluid. Left base subsegmental atelectasis or scar. A nodule along the left major fissure is similar at 4 mm and can be presumed benign. There is also a perifissural 2 mm right upper lobe pulmonary nodule which is unchanged. Calcified left upper lobe granuloma. Musculoskeletal: Left shoulder arthroplasty. Lower thoracic spondylosis. CT ABDOMEN PELVIS FINDINGS Hepatobiliary: Caudate lobe  enlargement is nonspecific. Old granulomatous disease within. Cholecystectomy, without biliary ductal dilatation. Pancreas: Fatty replacement involving the pancreatic head and less so body. Spleen: Normal in size, without focal abnormality. Adrenals/Urinary Tract: Normal adrenal glands. Mild bilateral renal cortical thinning. Low-density bilateral renal lesions are likely cysts. Other lesions are too small to characterize. Punctate right renal collecting system calculi. No hydronephrosis. A left pelvic calcification is positioned adjacent to the left ureter on 115/2. No bladder calculi. Stomach/Bowel: Stomach is contrast filled. The colon is normal to decompressed caliber. Right hemicolectomy. Proximal small bowel loops are fluid-filled and measure up to 4.0 cm. A "small bowel feces sign" is identified on 93/2 at the site of a transition to decompressed bowel. No mass in this area. No complicating ischemia. Vascular/Lymphatic: Aortic and branch vessel atherosclerosis. No abdominopelvic adenopathy. Reproductive: Normal prostate. Other: No significant free fluid. No free  intraperitoneal air. No evidence of omental or peritoneal disease. Musculoskeletal: Mild osteopenia. Presumed bone islands within the pelvis. IMPRESSION: 1. Small bowel obstruction within the mid ileum, likely due to underlying adhesions. No complicating ischemia. 2. Status post right hemicolectomy. No noncontrast CT findings of metastatic disease in the abdomen or pelvis. 3.  No acute process in the chest. 4. Coronary artery atherosclerosis. Aortic Atherosclerosis (ICD10-I70.0). 5. Right nephrolithiasis. 6. Similar mild ascending aortic dilatation. Recommend annual imaging followup by CTA or MRA. This recommendation follows 2010 ACCF/AHA/AATS/ACR/ASA/SCA/SCAI/SIR/STS/SVM Guidelines for the Diagnosis and Management of Patients with Thoracic Aortic Disease. Circulation. 2010; 121: W409-B353. Aortic aneurysm NOS (ICD10-I71.9). Electronically Signed    By: Abigail Miyamoto M.D.   On: 03/12/2019 15:59   Ct Chest Wo Contrast  Result Date: 03/12/2019 CLINICAL DATA:  Abdominal pain with nausea and vomiting. Concern for small bowel obstruction. EXAM: CT CHEST, ABDOMEN AND PELVIS WITHOUT CONTRAST TECHNIQUE: Multidetector CT imaging of the chest, abdomen and pelvis was performed following the standard protocol without IV contrast. COMPARISON:  06/03/2017. FINDINGS: CT CHEST FINDINGS Cardiovascular: Aortic atherosclerosis. Mild cardiomegaly, with LAD coronary artery atherosclerosis. Ascending aortic dilatation including at 4.2 cm on 32/2, similar. Tortuosity. Mediastinum/Nodes: No mediastinal or definite hilar adenopathy, given limitations of unenhanced CT. Lungs/Pleura: No pleural fluid. Left base subsegmental atelectasis or scar. A nodule along the left major fissure is similar at 4 mm and can be presumed benign. There is also a perifissural 2 mm right upper lobe pulmonary nodule which is unchanged. Calcified left upper lobe granuloma. Musculoskeletal: Left shoulder arthroplasty. Lower thoracic spondylosis. CT ABDOMEN PELVIS FINDINGS Hepatobiliary: Caudate lobe enlargement is nonspecific. Old granulomatous disease within. Cholecystectomy, without biliary ductal dilatation. Pancreas: Fatty replacement involving the pancreatic head and less so body. Spleen: Normal in size, without focal abnormality. Adrenals/Urinary Tract: Normal adrenal glands. Mild bilateral renal cortical thinning. Low-density bilateral renal lesions are likely cysts. Other lesions are too small to characterize. Punctate right renal collecting system calculi. No hydronephrosis. A left pelvic calcification is positioned adjacent to the left ureter on 115/2. No bladder calculi. Stomach/Bowel: Stomach is contrast filled. The colon is normal to decompressed caliber. Right hemicolectomy. Proximal small bowel loops are fluid-filled and measure up to 4.0 cm. A "small bowel feces sign" is identified on 93/2 at  the site of a transition to decompressed bowel. No mass in this area. No complicating ischemia. Vascular/Lymphatic: Aortic and branch vessel atherosclerosis. No abdominopelvic adenopathy. Reproductive: Normal prostate. Other: No significant free fluid. No free intraperitoneal air. No evidence of omental or peritoneal disease. Musculoskeletal: Mild osteopenia. Presumed bone islands within the pelvis. IMPRESSION: 1. Small bowel obstruction within the mid ileum, likely due to underlying adhesions. No complicating ischemia. 2. Status post right hemicolectomy. No noncontrast CT findings of metastatic disease in the abdomen or pelvis. 3.  No acute process in the chest. 4. Coronary artery atherosclerosis. Aortic Atherosclerosis (ICD10-I70.0). 5. Right nephrolithiasis. 6. Similar mild ascending aortic dilatation. Recommend annual imaging followup by CTA or MRA. This recommendation follows 2010 ACCF/AHA/AATS/ACR/ASA/SCA/SCAI/SIR/STS/SVM Guidelines for the Diagnosis and Management of Patients with Thoracic Aortic Disease. Circulation. 2010; 121: G992-E268. Aortic aneurysm NOS (ICD10-I71.9). Electronically Signed   By: Abigail Miyamoto M.D.   On: 03/12/2019 15:59   Dg Abd Portable 1v  Result Date: 03/14/2019 CLINICAL DATA:  Small bowel obstruction. EXAM: PORTABLE ABDOMEN - 1 VIEW COMPARISON:  Radiographs of March 13, 2019. FINDINGS: Distal tip of nasogastric tube is seen in proximal stomach. Residual contrast is seen in proximal small bowel as well as  left colon and rectum. Mildly dilated small bowel loop is seen in left side of abdomen which may represent ileus or less likely partial obstruction. IMPRESSION: Mildly dilated small bowel loop seen in left side of abdomen which may represent ileus or less likely partial small bowel obstruction. Residual contrast is now seen in the distal colon. Electronically Signed   By: Marijo Conception M.D.   On: 03/14/2019 08:26   Dg Abd Portable 1v-small Bowel Obstruction Protocol-initial,  8 Hr Delay  Result Date: 03/13/2019 CLINICAL DATA:  8 hour delay.  Small bowel obstruction EXAM: PORTABLE ABDOMEN - 1 VIEW COMPARISON:  CT from yesterday FINDINGS: Oral contrast is only seen within the gastric fundus. There is moderate gaseous distention of the stomach and proximal small bowel. No colonic distension. IMPRESSION: Continued small bowel obstruction. Oral contrast is only seen in the proximal stomach. Electronically Signed   By: Monte Fantasia M.D.   On: 03/13/2019 07:30   Dg Abd Portable 1v-small Bowel Protocol-position Verification  Result Date: 03/12/2019 CLINICAL DATA:  NG tube placement. Small bowel obstruction. EXAM: PORTABLE ABDOMEN - 1 VIEW COMPARISON:  May 13, 2016 FINDINGS: Enteric catheter with tip overlying the expected location of gastric body/gastric cardia. Nonspecific bowel gas pattern. Postsurgical changes in the upper abdomen. Vascular calcifications noted. IMPRESSION: Enteric catheter with tip overlying the expected location of gastric body/gastric cardia. Electronically Signed   By: Fidela Salisbury M.D.   On: 03/12/2019 19:48     Scheduled Meds:  metoprolol tartrate  2.5 mg Intravenous Q6H   sodium chloride flush  3 mL Intravenous Q12H   Continuous Infusions:  sodium chloride 125 mL/hr at 03/14/19 1147     LOS: 2 days    Time spent: 25 minutes spent in the coordination of care today.    Jonnie Finner, DO [facility] Pager [phone]  If 7PM-7AM, please contact night-coverage [email or web address] Password [email or web address] 03/14/2019, 2:30 PM

## 2019-03-14 NOTE — Progress Notes (Signed)
Subjective/Chief Complaint: Had BM Feels better   Objective: Vital signs in last 24 hours: Temp:  [98 F (36.7 C)-98.1 F (36.7 C)] 98.1 F (36.7 C) (08/11 0440) Pulse Rate:  [59-63] 59 (08/11 0440) Resp:  [18-20] 18 (08/11 0440) BP: (119-130)/(68-76) 119/70 (08/11 0440) SpO2:  [91 %] 91 % (08/11 0440) Last BM Date: 03/13/19  Intake/Output from previous day: 08/10 0701 - 08/11 0700 In: 3290.9 [P.O.:290; I.V.:3000.9] Out: 1550 [Urine:200; Emesis/NG output:1350] Intake/Output this shift: Total I/O In: -  Out: 200 [Urine:200]  Exam: Abdomen softer, non-tender  Lab Results:  Recent Labs    03/13/19 0229 03/14/19 0416  WBC 9.6 6.4  HGB 15.3 13.5  HCT 50.2 44.8  PLT 118* 108*   BMET Recent Labs    03/13/19 0229 03/14/19 0416  NA 141 141  K 5.3* 3.8  CL 89* 99  CO2 34* 30  GLUCOSE 119* 112*  BUN 33* 39*  CREATININE 3.04* 2.58*  CALCIUM 8.7* 7.7*   PT/INR No results for input(s): LABPROT, INR in the last 72 hours. ABG No results for input(s): PHART, HCO3 in the last 72 hours.  Invalid input(s): PCO2, PO2  Studies/Results: Ct Abdomen Pelvis Wo Contrast  Result Date: 03/12/2019 CLINICAL DATA:  Abdominal pain with nausea and vomiting. Concern for small bowel obstruction. EXAM: CT CHEST, ABDOMEN AND PELVIS WITHOUT CONTRAST TECHNIQUE: Multidetector CT imaging of the chest, abdomen and pelvis was performed following the standard protocol without IV contrast. COMPARISON:  06/03/2017. FINDINGS: CT CHEST FINDINGS Cardiovascular: Aortic atherosclerosis. Mild cardiomegaly, with LAD coronary artery atherosclerosis. Ascending aortic dilatation including at 4.2 cm on 32/2, similar. Tortuosity. Mediastinum/Nodes: No mediastinal or definite hilar adenopathy, given limitations of unenhanced CT. Lungs/Pleura: No pleural fluid. Left base subsegmental atelectasis or scar. A nodule along the left major fissure is similar at 4 mm and can be presumed benign. There is also a  perifissural 2 mm right upper lobe pulmonary nodule which is unchanged. Calcified left upper lobe granuloma. Musculoskeletal: Left shoulder arthroplasty. Lower thoracic spondylosis. CT ABDOMEN PELVIS FINDINGS Hepatobiliary: Caudate lobe enlargement is nonspecific. Old granulomatous disease within. Cholecystectomy, without biliary ductal dilatation. Pancreas: Fatty replacement involving the pancreatic head and less so body. Spleen: Normal in size, without focal abnormality. Adrenals/Urinary Tract: Normal adrenal glands. Mild bilateral renal cortical thinning. Low-density bilateral renal lesions are likely cysts. Other lesions are too small to characterize. Punctate right renal collecting system calculi. No hydronephrosis. A left pelvic calcification is positioned adjacent to the left ureter on 115/2. No bladder calculi. Stomach/Bowel: Stomach is contrast filled. The colon is normal to decompressed caliber. Right hemicolectomy. Proximal small bowel loops are fluid-filled and measure up to 4.0 cm. A "small bowel feces sign" is identified on 93/2 at the site of a transition to decompressed bowel. No mass in this area. No complicating ischemia. Vascular/Lymphatic: Aortic and branch vessel atherosclerosis. No abdominopelvic adenopathy. Reproductive: Normal prostate. Other: No significant free fluid. No free intraperitoneal air. No evidence of omental or peritoneal disease. Musculoskeletal: Mild osteopenia. Presumed bone islands within the pelvis. IMPRESSION: 1. Small bowel obstruction within the mid ileum, likely due to underlying adhesions. No complicating ischemia. 2. Status post right hemicolectomy. No noncontrast CT findings of metastatic disease in the abdomen or pelvis. 3.  No acute process in the chest. 4. Coronary artery atherosclerosis. Aortic Atherosclerosis (ICD10-I70.0). 5. Right nephrolithiasis. 6. Similar mild ascending aortic dilatation. Recommend annual imaging followup by CTA or MRA. This recommendation  follows 2010 ACCF/AHA/AATS/ACR/ASA/SCA/SCAI/SIR/STS/SVM Guidelines for the Diagnosis and Management of  Patients with Thoracic Aortic Disease. Circulation. 2010; 121: G295-M841. Aortic aneurysm NOS (ICD10-I71.9). Electronically Signed   By: Abigail Miyamoto M.D.   On: 03/12/2019 15:59   Ct Chest Wo Contrast  Result Date: 03/12/2019 CLINICAL DATA:  Abdominal pain with nausea and vomiting. Concern for small bowel obstruction. EXAM: CT CHEST, ABDOMEN AND PELVIS WITHOUT CONTRAST TECHNIQUE: Multidetector CT imaging of the chest, abdomen and pelvis was performed following the standard protocol without IV contrast. COMPARISON:  06/03/2017. FINDINGS: CT CHEST FINDINGS Cardiovascular: Aortic atherosclerosis. Mild cardiomegaly, with LAD coronary artery atherosclerosis. Ascending aortic dilatation including at 4.2 cm on 32/2, similar. Tortuosity. Mediastinum/Nodes: No mediastinal or definite hilar adenopathy, given limitations of unenhanced CT. Lungs/Pleura: No pleural fluid. Left base subsegmental atelectasis or scar. A nodule along the left major fissure is similar at 4 mm and can be presumed benign. There is also a perifissural 2 mm right upper lobe pulmonary nodule which is unchanged. Calcified left upper lobe granuloma. Musculoskeletal: Left shoulder arthroplasty. Lower thoracic spondylosis. CT ABDOMEN PELVIS FINDINGS Hepatobiliary: Caudate lobe enlargement is nonspecific. Old granulomatous disease within. Cholecystectomy, without biliary ductal dilatation. Pancreas: Fatty replacement involving the pancreatic head and less so body. Spleen: Normal in size, without focal abnormality. Adrenals/Urinary Tract: Normal adrenal glands. Mild bilateral renal cortical thinning. Low-density bilateral renal lesions are likely cysts. Other lesions are too small to characterize. Punctate right renal collecting system calculi. No hydronephrosis. A left pelvic calcification is positioned adjacent to the left ureter on 115/2. No bladder  calculi. Stomach/Bowel: Stomach is contrast filled. The colon is normal to decompressed caliber. Right hemicolectomy. Proximal small bowel loops are fluid-filled and measure up to 4.0 cm. A "small bowel feces sign" is identified on 93/2 at the site of a transition to decompressed bowel. No mass in this area. No complicating ischemia. Vascular/Lymphatic: Aortic and branch vessel atherosclerosis. No abdominopelvic adenopathy. Reproductive: Normal prostate. Other: No significant free fluid. No free intraperitoneal air. No evidence of omental or peritoneal disease. Musculoskeletal: Mild osteopenia. Presumed bone islands within the pelvis. IMPRESSION: 1. Small bowel obstruction within the mid ileum, likely due to underlying adhesions. No complicating ischemia. 2. Status post right hemicolectomy. No noncontrast CT findings of metastatic disease in the abdomen or pelvis. 3.  No acute process in the chest. 4. Coronary artery atherosclerosis. Aortic Atherosclerosis (ICD10-I70.0). 5. Right nephrolithiasis. 6. Similar mild ascending aortic dilatation. Recommend annual imaging followup by CTA or MRA. This recommendation follows 2010 ACCF/AHA/AATS/ACR/ASA/SCA/SCAI/SIR/STS/SVM Guidelines for the Diagnosis and Management of Patients with Thoracic Aortic Disease. Circulation. 2010; 121: L244-W102. Aortic aneurysm NOS (ICD10-I71.9). Electronically Signed   By: Abigail Miyamoto M.D.   On: 03/12/2019 15:59   Dg Abd Portable 1v  Result Date: 03/14/2019 CLINICAL DATA:  Small bowel obstruction. EXAM: PORTABLE ABDOMEN - 1 VIEW COMPARISON:  Radiographs of March 13, 2019. FINDINGS: Distal tip of nasogastric tube is seen in proximal stomach. Residual contrast is seen in proximal small bowel as well as left colon and rectum. Mildly dilated small bowel loop is seen in left side of abdomen which may represent ileus or less likely partial obstruction. IMPRESSION: Mildly dilated small bowel loop seen in left side of abdomen which may represent  ileus or less likely partial small bowel obstruction. Residual contrast is now seen in the distal colon. Electronically Signed   By: Marijo Conception M.D.   On: 03/14/2019 08:26   Dg Abd Portable 1v-small Bowel Obstruction Protocol-initial, 8 Hr Delay  Result Date: 03/13/2019 CLINICAL DATA:  8 hour delay.  Small  bowel obstruction EXAM: PORTABLE ABDOMEN - 1 VIEW COMPARISON:  CT from yesterday FINDINGS: Oral contrast is only seen within the gastric fundus. There is moderate gaseous distention of the stomach and proximal small bowel. No colonic distension. IMPRESSION: Continued small bowel obstruction. Oral contrast is only seen in the proximal stomach. Electronically Signed   By: Monte Fantasia M.D.   On: 03/13/2019 07:30   Dg Abd Portable 1v-small Bowel Protocol-position Verification  Result Date: 03/12/2019 CLINICAL DATA:  NG tube placement. Small bowel obstruction. EXAM: PORTABLE ABDOMEN - 1 VIEW COMPARISON:  May 13, 2016 FINDINGS: Enteric catheter with tip overlying the expected location of gastric body/gastric cardia. Nonspecific bowel gas pattern. Postsurgical changes in the upper abdomen. Vascular calcifications noted. IMPRESSION: Enteric catheter with tip overlying the expected location of gastric body/gastric cardia. Electronically Signed   By: Fidela Salisbury M.D.   On: 03/12/2019 19:48    Anti-infectives: Anti-infectives (From admission, onward)   None      Assessment/Plan:  SBO  Contrast in colon and having BM's.  Will remove NG and start po Potential discharge later today vs tomorrow  LOS: 2 days    Derek [name] 03/14/2019
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
## 2019-03-20 DIAGNOSIS — K5289 Other specified noninfective gastroenteritis and colitis: Secondary | ICD-10-CM | POA: Diagnosis not present

## 2019-03-20 DIAGNOSIS — K56609 Unspecified intestinal obstruction, unspecified as to partial versus complete obstruction: Secondary | ICD-10-CM | POA: Diagnosis not present

## 2019-03-20 DIAGNOSIS — E039 Hypothyroidism, unspecified: Secondary | ICD-10-CM | POA: Diagnosis not present

## 2019-03-20 DIAGNOSIS — Z0001 Encounter for general adult medical examination with abnormal findings: Secondary | ICD-10-CM | POA: Diagnosis not present

## 2019-03-20 DIAGNOSIS — Z6826 Body mass index (BMI) 26.0-26.9, adult: Secondary | ICD-10-CM | POA: Diagnosis not present

## 2019-03-21 ENCOUNTER — Other Ambulatory Visit: Payer: Self-pay | Admitting: *Deleted

## 2019-03-21 ENCOUNTER — Encounter: Payer: Self-pay | Admitting: *Deleted

## 2019-03-21 NOTE — Patient Outreach (Signed)
  Derek [name]) Care Management  03/21/2019  Derek [name] 1940-11-18 [id number]   EMMI-general discharge from [place] Day # 4 Date: Sunday March 19 2019  10 am  Red Alert Reason: sad/hopeless/anxious/empty? Yes   Insurance: [employer] account [employer] and [employer]  [facility] admissions x 1 ED visits x 1  in the last 6 months   Patient's wife returned a call to [facility] RN CM Derek [name] is listed on DPR with permission to speak with her She reports the patient is outside working in the yard [employer] wife is able to verify HIPAA Reviewed and addressed EMMI red alert/referral to [facility] with her   EMMI the answer to the EMMI question sad/hopeless/anxious/empty? Yes is reported to be incorrect. These s/s are denied. It is reported he had multiple and various calls once home and was frustrated with the automated EMMI system and may have provided an incorrect answer. Denies need of [facility] SW services for these s/s   Social: Derek [name] is a 78 year old retired [place] veteran who lives at home with his wife Derek [name]. He is independent with all care needs and transportation to medical appointments Assist provided by his wife Derek [name] and daughter prn   Conditions: SBO, Crohn's disease, colon CA s/p resection,PTSD, ascending aorti aneurysm, CKD stage 3 GFR 30-59, HTN, AKI , chronic diarrhea, hypotension, sinus bradycardia, osteoarthritis of left knee, gout, anxiety, hx of left shoulder replacement   DME: none   Medications: the wife denies concerns with taking medications as prescribed, affording medications, side effects of medications and questions about medications    Appointments:  Advance Directives: Denies need for assist with advance directives   Consent: [facility] RN CM reviewed [facility] services with patient. Patient gave verbal consent for services [facility] telephonic RN CM.    Plan: [facility] RN CM will close case at this time as patient has been assessed and no needs  identified/needs resolved.   Pt encouraged to return a call to [place] CM prn  [facility] RN CM discussed the outreach letter already sent on 03/21/19 with [facility] brochure enclosed for review  Derek L. Lavina Hamman, RN, BSN, [place] Coordinator Office number [phone] Mobile number [phone]  Main [facility] number [phone] Fax number [phone]

## 2019-03-21 NOTE — Patient Outreach (Signed)
[facility]) Care Management  03/21/2019  Derek [name] 1940-10-23 [id number]   EMMI-general discharge from [place] Day # 4 Date: Sunday March 19 2019  10 am  Red Alert Reason: ad/hopeless/anxious/empty? Yes   Insurance: [employer] [facility] admissions x 1 ED visits x 1  in the last 6 months    Outreach attempt # 1 No answer. [facility] RN CM left HIPAA compliant voicemail message along with CM's contact info.   Conditions: SBO, Crohn's disease, colon CA s/p resection,PTSD, ascending aorti aneurysm, CKD stage 3 GFR 30-59, HTN, AKI ,   Plan: [facility] RN CM sent an unsuccessful outreach letter and scheduled this patient for another call attempt within 4 business days  [name] L. Lavina Hamman, RN, BSN, [place] Coordinator Office number [phone] Mobile number [phone]  Main [facility] number [phone] Fax number [phone]

## 2019-03-22 ENCOUNTER — Other Ambulatory Visit: Payer: Self-pay | Admitting: *Deleted

## 2019-03-22 NOTE — Patient Outreach (Signed)
  [facility]) Care Management  03/22/2019  Derek [name] 06-16-1941 [id number]   Encounter opened in error Assessment completed on 03/21/19    [name], RN, BSN, [place] Coordinator Office number [phone] Mobile number [phone]  Main [facility] number [phone] Fax number [phone]

## 2019-03-23 ENCOUNTER — Other Ambulatory Visit: Payer: Self-pay

## 2019-03-23 ENCOUNTER — Encounter: Payer: Self-pay | Admitting: Cardiothoracic Surgery

## 2019-03-23 ENCOUNTER — Ambulatory Visit (INDEPENDENT_AMBULATORY_CARE_PROVIDER_SITE_OTHER): Payer: Medicare Other | Admitting: Cardiothoracic Surgery

## 2019-03-23 ENCOUNTER — Other Ambulatory Visit: Payer: Medicare Other

## 2019-03-23 VITALS — BP 120/73 | HR 60 | Temp 97.5°F | Resp 16 | Ht 72.0 in | Wt 197.0 lb

## 2019-03-23 DIAGNOSIS — I7121 Aneurysm of the ascending aorta, without rupture: Secondary | ICD-10-CM

## 2019-03-23 DIAGNOSIS — I712 Thoracic aortic aneurysm, without rupture: Secondary | ICD-10-CM | POA: Diagnosis not present

## 2019-03-23 NOTE — Progress Notes (Signed)
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 or abscess identified. Vascular/Lymphatic: There is moderate aortoiliac atherosclerotic disease. There is no aneurysmal dilatation or evidence of dissection. The origins of the mesenteric vasculature appear patent. The SMV, splenic vein, and main portal vein are patent. No portal venous gas identified. There is no adenopathy. Reproductive: The prostate and seminal vesicles appear unremarkable. Other: Postoperative changes of the anterior abdominal wall with a midline vertical anterior abdominal wall incisional scar. Abutment of loops of small bowel to the anterior peritoneal wall in the midline may represent adhesions. Musculoskeletal: Mild degenerative changes of the spine. No acute fracture. L2 hemangioma. IMPRESSION: 1. Postoperative changes of right hemicolectomy. Interval resolution of the previously  seen pneumoperitoneum and ascites. Residual inflammatory changes of the omental with interval improvement compared to prior study. No drainable fluid collection or abscess. 2. No evidence of bowel obstruction. Contrast traverses into the ostomy. 3. Abutment of loops of small bowel to the anterior peritoneal wall in the midline likely representing a degree of adhesion. 4. Splenomegaly. 5. Mild-to-moderate renal parenchyma atrophy and cortical irregularity. Small nonobstructing bilateral renal calculi as well as probable bilateral small renal cysts. No hydronephrosis. Electronically Signed   By: Anner Crete M.D.   On: 10/15/2016 02:43   Mr Angiogram Chest W Wo Contrast  Result Date: 03/11/2016 CLINICAL DATA:  78 year old male with a history of ascending thoracic aortic aneurysm. EXAM: MRA CHEST WITH OR W  ITHOUT CONTRAST TECHNIQUE: Angiographic images of the chest were obtained using MRA technique without and with intravenous contrast. CONTRAST:  52m MULTIHANCE GADOBENATE DIMEGLUMINE 529 MG/ML IV SOLN COMPARISON:  Most recent prior MRA chest 09/27/2015 FINDINGS: VASCULAR Unchanged dilatation of the aortic root measuring up to 5.3 cm at the sinuses of Valsalva (best measured on the sagittally reformatted images). This is unchanged compared to my measurement on the prior study. Similarly, the aortic root measures approximately 5.1 cm on the axial images although this is likely an underestimation. Aneurysmal dilatation of the tubular portion of the ascending thoracic aorta is also insignificantly changed at 4.3 cm. No effacement of the sino-tubular junction. No evidence of arch or descending aortic dilatation or aneurysm. No dissection. Conventional 3 vessel arch anatomy. The heart is normal in size. No pericardial effusion. The main and central pulmonary arteries are normal in size. No central pulmonary embolus. NON VASCULAR No focal signal abnormality or abnormal enhancement in the mediastinum, lungs,  visualized upper abdomen or musculoskeletal structures. There is metallic artifact in the region of the left glenohumeral joint secondary to a left shoulder arthroplasty prosthesis. IMPRESSION: 1. Stable dilatation of the aortic root measuring up to 5.3 cm at the sinuses of Valsalva (best measured on the sagittally reformatted images). On the axial images, the aortic root measures 5.1 cm which is also unchanged compared to prior imaging. 2. Stable fusiform aneurysmal dilatation of the tubular portion of the ascending thoracic aorta at 4.3 cm. Signed, HCriselda Peaches MD [facility] Electronically Signed   By: HJacqulynn CadetM.D.   On: 03/11/2016 10:53   Mr Angiogram Chest W Wo Contrast  09/27/2015  CLINICAL DATA:  F/U for ascending aortic aneurysm. No symtoms. Hx of skin cancer. EXAM: MRA CHEST WITH OR WITHOUT CONTRAST TECHNIQUE: Angiographic images of the chest were obtained using MRA technique without and with intravenous contrast. CONTRAST:  46m MULTIHANCE GADOBENATE DIMEGLUMINE 529 MG/ML IV SOLN (Half dose OK'd per Dr. KMaryland Pinkdue to GFR of 37). COMPARISON:  None. FINDINGS: Thoracic aorta shows no significant atheromatous irregularity, dissection, or stenosis. Classic 3 vessel brachiocephalic arterial origin anatomy without proximal stenosis. Maximum transverse diameters as follows: 5.1 cm sinuses of Valsalva (previously 5.1 cm) 4.1 cm sino-tubular junction 4.1 cm mid ascending 3.9 cm distal ascending/ proximal arch 3.4 cm distal arch 3.5 cm proximal descending 3 x 2.6 cm distal descending above the diaphragm Central pulmonary arteries unremarkable. No axillary, anterior endplate spurring in the mid thoracic spine. Limited visualized portions of the upper abdomen grossly unremarkable. Mediastinal or hilar adenopathy. No pleural or pericardial effusion. IMPRESSION: 1. Stable aortic root dilatation, 5.1 cm sinuses of Valsalva, without  complicating features. Electronically Signed   By: DLucrezia EuropeM.D.   On: 09/27/2015 13:57   Mr Angiogram Chest W Wo Contrast  03/22/2014   CLINICAL DATA:  SIZE OF THORACIC AORTA (THORACIC ANEURYSM)  EXAM: MRA CHEST WITH OR WITHOUT CONTRAST  TECHNIQUE: Angiographic images of the chest were obtained using MRA technique without and with intravenous contrast.  [place]  [address]  Results:  BUN 12 mg/dL,  Creatinine 1.6 mg/dL.  CONTRAST:  953mMULTIHANCE GADOBENATE DIMEGLUMINE 529 MG/ML IV SOLN  COMPARISON:  08/28/2013 and earlier studies  FINDINGS: The thoracic aorta is dilated to a diameter of 5.1 cm at the level of the sinuses of Valsalva (stable by my measurement), 4.4 cm at the sino-tubular junction, 4.2 cm distal ascending/ proximal arch, 3.5 cm distal arch/proximal descending, 2.7 cm distal descending above the diaphragm. No evidence of dissection or stenosis. Classic 3 vessel brachiocephalic arterial origin anatomy without proximal stenosis. No significant atheromatous irregularity. No pleural or pericardial effusion. No hilar or mediastinal adenopathy. Visualized portions of upper abdomen and proximal abdominal aorta unremarkable. Usual degenerative spurring at multiple contiguous levels in the mid thoracic spine.  IMPRESSION: 1. Stable 5.1 cm ascending aortic aneurysm without complicating features.   Electronically Signed   By: DaArne Cleveland.D.   On: 03/22/2014 10:51    Mr Angiogram Chest W Wo Contrast  08/28/2013   CLINICAL DATA:  Aortic aneurysm  EXAM: MRA CHEST WITH OR WITHOUT CONTRAST  TECHNIQUE: Angiographic images of the chest were obtained using MRA technique without and with intravenous contrast. Gated sagittal oblique images through the aortic arch were obtained.  CONTRAST:  1753mULTIHANCE GADOBENATE DIMEGLUMINE 529 MG/ML IV SOLN  COMPARISON:  09/05/2012 and earlier studies  FINDINGS: The thoracic aorta is dilated to a  diameter of 5.1 cm at the level the sinuses of Valsalva (stable by my measurement), 4.5 cm at the sino-tubular junction, 4.2 cm mid ascending, 3.6 cm proximal arch, 3.2 cm distal arch, 3.0 cm proximal descending, 2.6 cm distal descending above the diaphragm. No evidence of dissection or stenosis. Classic 3 vessel brachiocephalic arterial origin anatomy without proximal stenosis. No significant atheromatous irregularity.  No pleural or pericardial effusion. No hilar or mediastinal adenopathy. Visualized portions of upper abdomen and proximal abdominal aorta unremarkable. Usual endplate spurring at multiple contiguous levels in the mid thoracic spine.  IMPRESSION: 1. Stable ascending aortic aneurysm without complicating features.   Electronically  Signed   By: Arne Cleveland M.D.   On: 08/28/2013 12:29      Recent Lab Findings: Lab Results  Component Value Date   WBC 6.4 03/14/2019   HGB 13.5 03/14/2019   HCT 44.8 03/14/2019   PLT 108 (L) 03/14/2019   GLUCOSE 112 (H) 03/14/2019   CHOL 93 04/19/2015   TRIG 143 09/07/2016   HDL 31.60 (L) 04/19/2015   LDLDIRECT 28.0 04/19/2015   ALT 177 (H) 03/14/2019   AST 119 (H) 03/14/2019   NA 141 03/14/2019   K 3.8 03/14/2019   CL 99 03/14/2019   CREATININE 2.58 (H) 03/14/2019   BUN 39 (H) 03/14/2019   CO2 30 03/14/2019   TSH 4.090 05/02/2018   INR 1.04 06/02/2017   HGBA1C 5.0 07/22/2017   Study Conclusions  - Left ventricle: The cavity size was normal. There was mild focal basal hypertrophy of the septum. Systolic function was normal. The estimated ejection fraction was in the range of 55% to 60%. Wall motion was normal; there were no regional wall motion abnormalities. Doppler parameters are consistent with abnormal left ventricular relaxation (grade 1 diastolic dysfunction). - Aortic valve: There was mild to moderate regurgitation directed eccentrically in the LVOT and towards the mitral anterior leaflet. - Aorta: Aortic root  dimension: 50 mm (ED). - Ascending aorta: The ascending aorta was moderately dilated. - Left atrium: The atrium was mildly dilated.  Impressions:  - Compared to the prior study, there has been no significant interval change.  Echocardiography. M-mode, complete 2D, spectral Doppler, and color Doppler. Birthdate: Patient birthdate: 1941/08/01. Age: Patient is 78 yr old. Sex: Gender: male.  BMI: 28.2 kg/m^2. Blood pressure:   112/76 Patient status: Outpatient. Study date: Study date: 09/06/2015. Study time: 11:58 AM. Location: [facility]  -------------------------------------------------------------------  ------------------------------------------------------------------- Left ventricle: The cavity size was normal. There was mild focal basal hypertrophy of the septum. Systolic function was normal. The estimated ejection fraction was in the range of 55% to 60%. Wall motion was normal; there were no regional wall motion abnormalities. Doppler parameters are consistent with abnormal left ventricular relaxation (grade 1 diastolic dysfunction).  ------------------------------------------------------------------- Aortic valve:  Trileaflet; mildly thickened, mildly calcified leaflets. Mobility was not restricted. Doppler: Transvalvular velocity was within the normal range. There was no stenosis. There was mild to moderate regurgitation directed eccentrically in the LVOT and towards the mitral anterior leaflet.  ------------------------------------------------------------------- Aorta: Ascending aorta: The ascending aorta was moderately dilated.  ------------------------------------------------------------------- Mitral valve:  Structurally normal valve.  Mobility was not restricted. Doppler: Transvalvular velocity was within the normal range. There was no evidence for stenosis. There was  no regurgitation.  ------------------------------------------------------------------- Left atrium: The atrium was mildly dilated.  ------------------------------------------------------------------- Right ventricle: The cavity size was normal. Wall thickness was normal. Systolic function was normal.  ------------------------------------------------------------------- Pulmonic valve:  Structurally normal valve.  Cusp separation was normal. Doppler: Transvalvular velocity was within the normal range. There was no evidence for stenosis. There was trivial regurgitation.  ------------------------------------------------------------------- Tricuspid valve:  Structurally normal valve.  Doppler: Transvalvular velocity was within the normal range. There was trivial regurgitation.  ------------------------------------------------------------------- Pulmonary artery:  The main pulmonary artery was normal-sized. Systolic pressure was within the normal range.  ------------------------------------------------------------------- Right atrium: The atrium was normal in size.  ------------------------------------------------------------------- Pericardium: There was no pericardial effusion.  ------------------------------------------------------------------- Systemic veins: Inferior vena cava: The vessel was normal in size.  ------------------------------------------------------------------- Measurements  Left ventricle  Value    Reference LV ID, ED, PLAX chordal         46.9 mm   43 - 52 LV ID, ES, PLAX chordal         33.5 mm   23 - 38 LV fx shortening, PLAX chordal      29  %   >=29 LV PW thickness, ED           10.9 mm   --------- IVS/LV PW ratio, ED           1.17     <=1.3 Stroke volume, 2D            111  ml   --------- Stroke volume/bsa, 2D           50  ml/m^2 --------- LV ejection fraction, 1-p A4C      55  %   --------- LV end-diastolic volume, 2-p       129  ml   --------- LV end-systolic volume, 2-p       55  ml   --------- LV ejection fraction, 2-p        57  %   --------- Stroke volume, 2-p            74  ml   --------- LV end-diastolic volume/bsa, 2-p     58  ml/m^2 --------- LV end-systolic volume/bsa, 2-p     25  ml/m^2 --------- Stroke volume/bsa, 2-p          33.5 ml/m^2 --------- LV e&', lateral              7.12 cm/s  --------- LV E/e&', lateral             9.28     --------- LV e&', medial              5.17 cm/s  --------- LV E/e&', medial             12.79    --------- LV e&', average              6.15 cm/s  --------- LV E/e&', average             10.76    ---------  Ventricular septum            Value    Reference IVS thickness, ED            12.7 mm   ---------  LVOT                   Value    Reference LVOT ID, S                25  mm   --------- LVOT area                4.91 cm^2  --------- LVOT ID                 25  mm   --------- LVOT peak velocity, S          95.5 cm/s  --------- LVOT mean velocity, S          65.7 cm/s  --------- LVOT VTI, S               22.7 cm   --------- Stroke volume (SV), LVOT DP       111.4 ml   --------- Stroke index (SV/bsa), LVOT DP      50.5 ml/m^2 ---------  Aortic valve               Value    Reference Aortic regurg pressure half-time     664  ms   ---------  Aorta                  Value    Reference Aortic root ID, ED            50  mm    --------- Ascending aorta ID, A-P, S        44  mm   ---------  Left atrium               Value    Reference LA ID, A-P, ES              43  mm   --------- LA ID/bsa, A-P              1.95 cm/m^2 <=2.2 LA volume, S               67  ml   --------- LA volume/bsa, S             30.4 ml/m^2 --------- LA volume, ES, 1-p A4C          69  ml   --------- LA volume/bsa, ES, 1-p A4C        31.3 ml/m^2 --------- LA volume, ES, 1-p A2C          62  ml   --------- LA volume/bsa, ES, 1-p A2C        28.1 ml/m^2 ---------  Mitral valve               Value    Reference Mitral E-wave peak velocity       66.1 cm/s  --------- Mitral A-wave peak velocity       62.7 cm/s  --------- Mitral deceleration time     (H)   243  ms   150 - 230 Mitral E/A ratio, peak          1.1     ---------  Pulmonary arteries            Value    Reference PA pressure, S, DP            22  mm Hg <=30  Tricuspid valve             Value    Reference Tricuspid regurg peak velocity      217  cm/s  --------- Tricuspid peak RV-RA gradient      19  mm Hg ---------  Systemic veins              Value    Reference Estimated CVP              3   mm Hg ---------  Right ventricle             Value    Reference RV pressure, S, DP            22  mm Hg <=30 RV s&', lateral, S            11.4 cm/s  ---------  Legend: (L) and (H) mark values outside specified reference range.  ------------------------------------------------------------------- Prepared and Electronically Authenticated by  Candee Furbish, M.D. 2017-02-03T15:03:02   ECHO:09/2014  LV EF: 50% -   55%  ------------------------------------------------------------------- Indications:   I71.2 (Aneurysm of thoracic aorta). AVD (I35.9).  ------------------------------------------------------------------- History:  PMH:  Acquired from the patient and from the patient&'s chart. Mild aortic regurgitation. Deep vein thrombosis. Pulmonary embolic disease. Risk factors: Former tobacco use.  ------------------------------------------------------------------- Study Conclusions  - Left ventricle: The cavity size was normal. Wall thickness was normal. Systolic function was normal. The estimated ejection fraction was in the range of 50% to 55%. Doppler parameters are consistent with abnormal left ventricular relaxation (grade 1 diastolic dysfunction). - Aortic valve: Trileaflet. There was mild to moderate regurgitation directed centrally in the LVOT. - Aortic root: The aortic root was moderately dilated. Sinuses of Valsalva 51 mm, mid ascending aorta 43 mm.  ------------------------------------------------------------------- Labs, prior tests, procedures, and surgery: Echocardiography (January 2015).   EF was 50%. Ascending aorta 50 mm. Aortic root 47 mm.  Magnetic resonance angiography (January 2015).  The study demonstrated an aneurysm. Sinus of valsalva 42m, 42 sino-tubular junction, and ascending aorta 42 mm. Transthoracic echocardiography. M-mode, complete 2D, spectral Doppler, and color Doppler. Birthdate: Patient birthdate: [birth year] Age: Patient is 78yr old. Sex: Gender: male. BMI: 28.8 kg/m^2. Blood pressure:   118/73 Patient status: Outpatient. Study date: Study date: 09/21/2014. Study time: 10:39 AM. Location: [facility]  -------------------------------------------------------------------  ------------------------------------------------------------------- Left ventricle: The cavity size was normal. Wall thickness  was normal. Systolic function was normal. The estimated ejection fraction was in the range of 50% to 55%. Doppler parameters are consistent with abnormal left ventricular relaxation (grade 1 diastolic dysfunction). There was no evidence of elevated ventricular filling pressure by Doppler parameters.  ------------------------------------------------------------------- Aortic valve:  Trileaflet. Doppler:  There was no stenosis. There was mild to moderate regurgitation directed centrally in the LVOT.  ------------------------------------------------------------------- Aorta: Aortic root: The aortic root was moderately dilated. Sinuses of Valsalva 51 mm, mid ascending aorta 43 mm.  ------------------------------------------------------------------- Mitral valve:  Structurally normal valve.  Leaflet separation was normal. Doppler: Transvalvular velocity was within the normal range. There was no evidence for stenosis. There was no regurgitation.  ------------------------------------------------------------------- Left atrium: The atrium was normal in size.  ------------------------------------------------------------------- Right ventricle: The cavity size was normal. Wall thickness was normal. Systolic function was normal.  ------------------------------------------------------------------- Pulmonic valve:  Structurally normal valve.  Cusp separation was normal. Doppler: Transvalvular velocity was within the normal range. There was no regurgitation.  ------------------------------------------------------------------- Tricuspid valve:  Structurally normal valve.  Leaflet separation was normal. Doppler: Transvalvular velocity was within the normal range. There was no regurgitation.  ------------------------------------------------------------------- Right atrium: The atrium was normal in  size.  ------------------------------------------------------------------- Pericardium: There was no pericardial effusion.  Chronic Kidney Disease   Stage I     GFR >90  Stage II    GFR 60-89  Stage IIIA GFR 45-59  Stage IIIB GFR 30-44  Stage IV   GFR 15-29  Stage V    GFR  <15  Lab Results  Component Value Date   CREATININE 2.58 (H) 03/14/2019   Estimated Creatinine Clearance: 26.7 mL/min (A) (by C-G formula based on SCr of 2.58 mg/dL (H)).   Aortic Size Index=      5.0   /Body surface area is 2.13 meters squared. =2.34  < 2.75 cm/m2      4% risk per year 2.75 to 4.25          8% risk per year > 4.25 cm/m2    20% risk per year  Assessment / Plan: Dilated ascending aorta without history of bicuspid valve or Marfan's or other associated genetic disease.  The patient's recovering from small bowel obstruction.  Follow-up CT scan of the chest  done last week while the patient was hospitalized actually shows the aorta slightly smaller.   no family history of Aortic dissection or sudden unexplained death at young age.(sister with brain aneurysm and mother ruptured AAA).  He's now on anticoagulation with Eliquis due to atrial fibrillation  Follow-up MR of the chest with no contrast, in 1 year  Patient will need follow-up echocardiogram in 6 months and to see me at that time, plan repeat CTA of the chest in 1 year  Grace Isaac MD      [address] [place] [address] Office [phone]   Beeper [phone]

## 2019-03-27 DIAGNOSIS — E663 Overweight: Secondary | ICD-10-CM | POA: Diagnosis not present

## 2019-03-27 DIAGNOSIS — E039 Hypothyroidism, unspecified: Secondary | ICD-10-CM | POA: Diagnosis not present

## 2019-03-27 DIAGNOSIS — Z0001 Encounter for general adult medical examination with abnormal findings: Secondary | ICD-10-CM | POA: Diagnosis not present

## 2019-03-27 DIAGNOSIS — Z6826 Body mass index (BMI) 26.0-26.9, adult: Secondary | ICD-10-CM | POA: Diagnosis not present

## 2019-03-27 DIAGNOSIS — Z1389 Encounter for screening for other disorder: Secondary | ICD-10-CM | POA: Diagnosis not present

## 2019-04-03 DIAGNOSIS — N189 Chronic kidney disease, unspecified: Secondary | ICD-10-CM | POA: Diagnosis not present

## 2019-04-03 DIAGNOSIS — E781 Pure hyperglyceridemia: Secondary | ICD-10-CM | POA: Diagnosis not present

## 2019-04-03 DIAGNOSIS — N184 Chronic kidney disease, stage 4 (severe): Secondary | ICD-10-CM | POA: Diagnosis not present

## 2019-04-03 DIAGNOSIS — E039 Hypothyroidism, unspecified: Secondary | ICD-10-CM | POA: Diagnosis not present

## 2019-04-04 DIAGNOSIS — L820 Inflamed seborrheic keratosis: Secondary | ICD-10-CM | POA: Diagnosis not present

## 2019-04-04 DIAGNOSIS — L718 Other rosacea: Secondary | ICD-10-CM | POA: Diagnosis not present

## 2019-05-02 DIAGNOSIS — I129 Hypertensive chronic kidney disease with stage 1 through stage 4 chronic kidney disease, or unspecified chronic kidney disease: Secondary | ICD-10-CM | POA: Diagnosis not present

## 2019-05-02 DIAGNOSIS — N189 Chronic kidney disease, unspecified: Secondary | ICD-10-CM | POA: Diagnosis not present

## 2019-05-02 DIAGNOSIS — E039 Hypothyroidism, unspecified: Secondary | ICD-10-CM | POA: Diagnosis not present

## 2019-05-02 DIAGNOSIS — K912 Postsurgical malabsorption, not elsewhere classified: Secondary | ICD-10-CM | POA: Diagnosis not present

## 2019-05-02 DIAGNOSIS — N2589 Other disorders resulting from impaired renal tubular function: Secondary | ICD-10-CM | POA: Diagnosis not present

## 2019-05-02 DIAGNOSIS — C189 Malignant neoplasm of colon, unspecified: Secondary | ICD-10-CM | POA: Diagnosis not present

## 2019-05-02 DIAGNOSIS — N2581 Secondary hyperparathyroidism of renal origin: Secondary | ICD-10-CM | POA: Diagnosis not present

## 2019-05-02 DIAGNOSIS — N183 Chronic kidney disease, stage 3 (moderate): Secondary | ICD-10-CM | POA: Diagnosis not present

## 2019-05-02 DIAGNOSIS — D631 Anemia in chronic kidney disease: Secondary | ICD-10-CM | POA: Diagnosis not present

## 2019-05-02 DIAGNOSIS — K5090 Crohn's disease, unspecified, without complications: Secondary | ICD-10-CM | POA: Diagnosis not present

## 2019-05-02 DIAGNOSIS — R82992 Hyperoxaluria: Secondary | ICD-10-CM | POA: Diagnosis not present

## 2019-05-02 DIAGNOSIS — N200 Calculus of kidney: Secondary | ICD-10-CM | POA: Diagnosis not present

## 2019-05-02 DIAGNOSIS — M109 Gout, unspecified: Secondary | ICD-10-CM | POA: Diagnosis not present

## 2019-05-03 DIAGNOSIS — D6949 Other primary thrombocytopenia: Secondary | ICD-10-CM | POA: Diagnosis not present

## 2019-05-03 DIAGNOSIS — E781 Pure hyperglyceridemia: Secondary | ICD-10-CM | POA: Diagnosis not present

## 2019-05-03 DIAGNOSIS — I714 Abdominal aortic aneurysm, without rupture: Secondary | ICD-10-CM | POA: Diagnosis not present

## 2019-05-16 DIAGNOSIS — L718 Other rosacea: Secondary | ICD-10-CM | POA: Diagnosis not present

## 2019-06-26 DIAGNOSIS — M25562 Pain in left knee: Secondary | ICD-10-CM | POA: Diagnosis not present

## 2019-06-26 DIAGNOSIS — M1712 Unilateral primary osteoarthritis, left knee: Secondary | ICD-10-CM | POA: Diagnosis not present

## 2019-07-11 DIAGNOSIS — L218 Other seborrheic dermatitis: Secondary | ICD-10-CM | POA: Diagnosis not present

## 2019-07-11 DIAGNOSIS — L570 Actinic keratosis: Secondary | ICD-10-CM | POA: Diagnosis not present

## 2019-07-11 DIAGNOSIS — L718 Other rosacea: Secondary | ICD-10-CM | POA: Diagnosis not present

## 2019-07-19 DIAGNOSIS — M109 Gout, unspecified: Secondary | ICD-10-CM | POA: Diagnosis not present

## 2019-07-19 DIAGNOSIS — Z6827 Body mass index (BMI) 27.0-27.9, adult: Secondary | ICD-10-CM | POA: Diagnosis not present

## 2019-07-19 DIAGNOSIS — E663 Overweight: Secondary | ICD-10-CM | POA: Diagnosis not present

## 2019-07-19 DIAGNOSIS — M25462 Effusion, left knee: Secondary | ICD-10-CM | POA: Diagnosis not present

## 2019-07-19 DIAGNOSIS — E063 Autoimmune thyroiditis: Secondary | ICD-10-CM | POA: Diagnosis not present

## 2019-08-09 ENCOUNTER — Ambulatory Visit (INDEPENDENT_AMBULATORY_CARE_PROVIDER_SITE_OTHER): Payer: Medicare Other | Admitting: Internal Medicine

## 2019-08-09 ENCOUNTER — Encounter: Payer: Self-pay | Admitting: Internal Medicine

## 2019-08-09 VITALS — BP 124/70 | HR 80 | Temp 98.1°F | Ht 70.0 in | Wt 198.0 lb

## 2019-08-09 DIAGNOSIS — K5080 Crohn's disease of both small and large intestine without complications: Secondary | ICD-10-CM

## 2019-08-09 DIAGNOSIS — R7989 Other specified abnormal findings of blood chemistry: Secondary | ICD-10-CM

## 2019-08-09 DIAGNOSIS — Z85038 Personal history of other malignant neoplasm of large intestine: Secondary | ICD-10-CM | POA: Diagnosis not present

## 2019-08-09 DIAGNOSIS — Z8719 Personal history of other diseases of the digestive system: Secondary | ICD-10-CM | POA: Diagnosis not present

## 2019-08-09 NOTE — Patient Instructions (Signed)
We are going to get your lab results from the [place] for review and we will be in touch.   I appreciate the opportunity to care for you. Silvano Rusk, MD, [facility]

## 2019-08-09 NOTE — Assessment & Plan Note (Signed)
NED Observation Side effects w/ 6 MP and Humra in past He wants to avoid medications if possible Review [facility] labs

## 2019-08-14 ENCOUNTER — Encounter: Payer: Self-pay | Admitting: Internal Medicine

## 2019-08-14 DIAGNOSIS — Z85038 Personal history of other malignant neoplasm of large intestine: Secondary | ICD-10-CM | POA: Insufficient documentation

## 2019-08-14 NOTE — Assessment & Plan Note (Addendum)
Due to adhesions +/- Crohn's though think mainly re;ated to adhesions Conservative Tx is hope if recurs

## 2019-08-14 NOTE — Progress Notes (Signed)
Derek [name] 79 y.o. 1941/07/18 [id number]  Assessment & Plan:   Encounter Diagnoses  Name Primary?  . Crohn's disease of both small and large intestine without complication ([place]) Yes  . Personal history of colon cancer   . History of multiple small bowel obstructions   . Elevated LFTs     Crohn's ileocolitis (HCC) NED Observation Side effects w/ 6 MP and Humra in past He wants to avoid medications if possible Review [facility] labs  History of small bowel obstruction Due to adhesions +/- Crohn's though think mainly re;ated to adhesions Conservative Tx is hope if recurs   Personal history of colon cancer Repeat colonoscopy 2022 routine if vigorous  Elevated LFTs [facility] to reassess - ? If the abnormality was related to acute illness (SBO /2020) and actually nopn-specific Plan to review [facility] labs   Derek [name], Derek Reichmann, MD Derek Overall, MD  Subjective:   Chief Complaint: hx colon cancer, Crohn's disease  HPI Here for f/u colon cancer, Crohn's disease - no active c/o atthis time. Had colon cancer resection 2018 - malignancy in a polyp of transverse colon - had a subsequent perforation, ileostomy and then takedown. Before and after that he has been plagued with recurrent SBO's - most recently 03/2019 Tx w/ NG decompression - that resolved. CT and hospital records reviewed. Had abnl LFT's then. [place] did a set of labs not long ago. He is not aware of results. Was wondering if due for another colonoscopy Allergies  Allergen Reactions  . Lorazepam Other (See Comments)    Reaction:  Hallucinations   . Humira [Adalimumab] Other (See Comments)    Pt states that he got pancreatitis.    . Quinolones     Patient was warned about not using Cipro and similar antibiotics. Recent studies have raised concern that fluoroquinolone antibiotics could be associated with an increased risk of aortic aneurysm Fluoroquinolones have non-antimicrobial properties that might jeopardise  the integrity of the extracellular matrix of the vascular wall In a  propensity score matched cohort study in [place], there was a 66% increased rate of aortic aneurysm or dissection associated with oral fluoroquinolone use, compared wit   Current Meds  Medication Sig  . Acidophilus Lactobacillus CAPS Take 1 capsule by mouth daily.  Marland Kitchen amiodarone (PACERONE) 200 MG tablet Take 1 tablet (200 mg total) by mouth daily.  . calcium carbonate (TUMS - DOSED IN MG ELEMENTAL CALCIUM) 500 MG chewable tablet Chew 2 tablets by mouth 2 (two) times daily.  . Cholecalciferol (VITAMIN D) 2000 units CAPS Take 2,000 Units by mouth daily.  . cyanocobalamin (,VITAMIN B-12,) 1000 MCG/ML injection Inject 1,000 mcg into the muscle. THREE TIMES A MONTH  . ELIQUIS 5 MG TABS tablet Take 1 tablet (5 mg total) by mouth 2 (two) times daily.  . famotidine (PEPCID) 20 MG tablet Take 20 mg by mouth daily.  . febuxostat (ULORIC) 40 MG tablet Take 40 mg by mouth daily.  . ferrous sulfate 325 (65 FE) MG tablet Take 325 mg by mouth daily with breakfast.  . gabapentin (NEURONTIN) 100 MG capsule Take one (1) capsules (100 mg) by mouth each morning and 379m at bedtime.  .Marland Kitchenlevothyroxine (SYNTHROID) 50 MCG tablet Take 50 mcg by mouth daily before breakfast.  . magnesium oxide (MAGNESIUM-OXIDE) 400 (241.3 Mg) MG tablet Take 1,200 mg by mouth 2 (two) times daily.   . Multiple Vitamin (MULTIVITAMIN WITH MINERALS) TABS tablet Take 1 tablet by mouth daily.  .Marland KitchenPARoxetine (PAXIL) 20  MG tablet Take 20 mg by mouth daily.   . sodium bicarbonate 650 MG tablet Take 1,300 mg by mouth 2 (two) times daily.   . traZODone (DESYREL) 100 MG tablet Take 100 mg by mouth at bedtime.   Past Medical History:  Diagnosis Date  . Allergy   . Anemia   . Anxiety   . Aortic insufficiency    a. mild-mod by echo 09/2015.  . Arthritis    "knees; left shoulder" (09/21/2013)  . Ascending aortic aneurysm (HCC)    a. last measurement 5.3 cm 03/2016 -> f/u planned  09/2015 to continue to follow.  . Atrial fibrillation ([place])   . B12 deficiency    takes Vit 12 shot every 14days   . Bowel perforation ([place]) 08/27/2016  . Cataract   . CKD (chronic kidney disease) stage 3, GFR 30-59 ml/min 08/22/2011  . Clotting disorder ([place])   . Colonic ischemia ([place]) 08/27/2016  . Crohn's disease ([place])   . Depression   . Enlarged prostate   . Enteric hyperoxaluria 02/21/2016  . GERD (gastroesophageal reflux disease)    takes Omeprazole daily  . Gout    takes Uloric and Colchicine daily  . Heart murmur   . Hepatitis C 1978   negtive RNA load - spontaneously cleared  . Hiatal hernia   . High output ileostomy ([place]) 06/02/2017  . History of blood transfusion 1978; 1990's; ?   "w/bowel resection; S/P allupurinol; ?" (09/21/2013)  . History of colon polyps   . History of kidney stones   . History of MRSA infection 2010  . History of pulmonary embolism 2006   both legs and both lungs /notes 08/26/2008 (09/21/2013)  . History of small bowel obstruction   . History of staph infection 1978  . Hyperoxaluria    Intestinal  . Hypertension   . Insomnia    takes Trazodone nightly  . Internal hemorrhoids   . LV dysfunction    a. h/o EF 45-50% in 2015, normalized on subsequent echoes.  . Nephrolithiasis   . Nocardia infection   . Pancreatitis 2010   elevated lipase and amylase, stranding in tail of pancreas, ? from Humira  . Pancytopenia    Hx of  . Peripheral neuropathy    takes Gabapentin daily  . Pneumonia    hx of   . Post-traumatic stress syndrome    takes Paxil nightly  . PTSD (post-traumatic stress disorder)   . Pulmonary nodule    a. 70m by CT 05/2015, recommended f/u 6-12 months.  . RLS (restless legs syndrome)   . Rosacea conjunctivitis(372.31)    takes Minocin daily  . Secondary hyperparathyroidism ([place] 02/21/2016  . Sinus bradycardia   . Skin cancer    "cut/burned off left ear and face" (09/21/2013)  . [place] bowel obstruction ([place]   . Status post  reversal of ileostomy 07/23/2017  . Thrombocytopenia ([place]    hx of   Past Surgical History:  Procedure Laterality Date  . ANKLE SURGERY Right   . APPENDECTOMY  1978  . BOWEL RESECTION  1978 X 2  . CARDIOVERSION N/A 05/29/2016   Procedure: CARDIOVERSION;  Surgeon: MSanda Klein MD;  [facility];  Service: Cardiovascular;  Laterality: N/A;  . CHOLECYSTECTOMY    . Colon Cancer    . COLON RESECTION N/A 08/14/2016   Procedure: LAPAROSCOPIC RESECTION TRANSVERSE COLON;  Surgeon: DAlphonsa Overall MD;  Location: [facility];  Service: General;  Laterality: N/A;  . COLON SURGERY    . COLONOSCOPY    .  ESOPHAGOGASTRODUODENOSCOPY    . EYE SURGERY     cataract surgery bilateral  . FOOT SURGERY Right    "took gout out"  . HEMICOLECTOMY Right   . ILEOCECETOMY  1978   [name] 05/10/2000  (09/21/2013)  . ILEOSTOMY    . ILEOSTOMY CLOSURE N/A 07/23/2017   Procedure: ILEOSTOMY REVERSAL ;  Surgeon: Derek Overall, MD;  Location: [facility];  Service: General;  Laterality: N/A;  . INGUINAL HERNIA REPAIR Right   . IR FLUORO GUIDE CV LINE RIGHT  05/07/2017  . IR REMOVAL TUN CV CATH W/O FL  10/08/2017  . IR US GUIDE VASC ACCESS RIGHT  05/07/2017  . KNEE ARTHROSCOPY Left   . LAPAROTOMY N/A 08/27/2016   Procedure: EXPLORATORYLAPAROTOMY, LYSIS OF ADHESIONS, ILEOSTOMY, RIGHT COLECTOMY;  Surgeon: Derek Overall, MD;  Location: [facility];  Service: General;  Laterality: N/A;  . LIGAMENT REPAIR Left   . POLYPECTOMY    . TEE WITHOUT CARDIOVERSION N/A 05/29/2016   Procedure: TRANSESOPHAGEAL ECHOCARDIOGRAM (TEE);  Surgeon: Sanda Klein, MD;  Location: [facility];  Service: Cardiovascular;  Laterality: N/A;  . TOTAL SHOULDER ARTHROPLASTY Left 09/21/2013  . TOTAL SHOULDER ARTHROPLASTY Left 09/21/2013   Procedure: LEFT TOTAL SHOULDER ARTHROPLASTY;  Surgeon: Marin Shutter, MD;  Location: [facility];  Service: Orthopedics;  Laterality: Left;   Social History   Social History Narrative   Married 2 children and 6 grandchildren  all local   [employer]   Daily caffeine   Does not exercise regularly   family history includes Aneurysm in his mother and sister; Hypertension in his father; Kidney disease in his father.   Review of Systems As above  Objective:   Physical Exam BP 124/70 (BP Location: Left Arm, Patient Position: Sitting, Cuff Size: Normal)   Pulse 80   Temp 98.1 F (36.7 C)   Ht 5' 10"  (1.778 m)   Wt 198 lb (89.8 kg)   BMI 28.41 kg/m  NAD  23 mins time total

## 2019-08-14 NOTE — Assessment & Plan Note (Signed)
Repeat colonoscopy 2022 routine if vigorous

## 2019-08-14 NOTE — Assessment & Plan Note (Signed)
[facility] to reassess - ? If the abnormality was related to acute illness (SBO /2020) and actually nopn-specific Plan to review [facility] labs

## 2019-08-15 ENCOUNTER — Other Ambulatory Visit: Payer: Self-pay | Admitting: Cardiothoracic Surgery

## 2019-08-15 DIAGNOSIS — I7121 Aneurysm of the ascending aorta, without rupture: Secondary | ICD-10-CM

## 2019-08-15 DIAGNOSIS — I712 Thoracic aortic aneurysm, without rupture: Secondary | ICD-10-CM

## 2019-08-28 ENCOUNTER — Other Ambulatory Visit (HOSPITAL_COMMUNITY): Payer: Medicare Other

## 2019-09-06 ENCOUNTER — Ambulatory Visit (HOSPITAL_COMMUNITY): Payer: Medicare Other | Attending: Cardiology

## 2019-09-06 ENCOUNTER — Other Ambulatory Visit: Payer: Self-pay

## 2019-09-06 DIAGNOSIS — I712 Thoracic aortic aneurysm, without rupture: Secondary | ICD-10-CM | POA: Insufficient documentation

## 2019-09-06 DIAGNOSIS — N2581 Secondary hyperparathyroidism of renal origin: Secondary | ICD-10-CM | POA: Diagnosis not present

## 2019-09-06 DIAGNOSIS — K559 Vascular disorder of intestine, unspecified: Secondary | ICD-10-CM | POA: Diagnosis not present

## 2019-09-06 DIAGNOSIS — K5090 Crohn's disease, unspecified, without complications: Secondary | ICD-10-CM | POA: Diagnosis not present

## 2019-09-06 DIAGNOSIS — I129 Hypertensive chronic kidney disease with stage 1 through stage 4 chronic kidney disease, or unspecified chronic kidney disease: Secondary | ICD-10-CM | POA: Diagnosis not present

## 2019-09-06 DIAGNOSIS — I7121 Aneurysm of the ascending aorta, without rupture: Secondary | ICD-10-CM

## 2019-09-06 DIAGNOSIS — N189 Chronic kidney disease, unspecified: Secondary | ICD-10-CM | POA: Diagnosis not present

## 2019-09-06 DIAGNOSIS — M109 Gout, unspecified: Secondary | ICD-10-CM | POA: Diagnosis not present

## 2019-09-06 DIAGNOSIS — K912 Postsurgical malabsorption, not elsewhere classified: Secondary | ICD-10-CM | POA: Diagnosis not present

## 2019-09-06 DIAGNOSIS — N183 Chronic kidney disease, stage 3 unspecified: Secondary | ICD-10-CM | POA: Diagnosis not present

## 2019-09-06 DIAGNOSIS — C189 Malignant neoplasm of colon, unspecified: Secondary | ICD-10-CM | POA: Diagnosis not present

## 2019-09-06 DIAGNOSIS — N2589 Other disorders resulting from impaired renal tubular function: Secondary | ICD-10-CM | POA: Diagnosis not present

## 2019-09-06 DIAGNOSIS — D631 Anemia in chronic kidney disease: Secondary | ICD-10-CM | POA: Diagnosis not present

## 2019-09-06 DIAGNOSIS — N200 Calculus of kidney: Secondary | ICD-10-CM | POA: Diagnosis not present

## 2019-09-06 DIAGNOSIS — R82992 Hyperoxaluria: Secondary | ICD-10-CM | POA: Diagnosis not present

## 2019-09-11 DIAGNOSIS — E875 Hyperkalemia: Secondary | ICD-10-CM | POA: Diagnosis not present

## 2019-09-25 ENCOUNTER — Other Ambulatory Visit: Payer: Self-pay

## 2019-09-25 ENCOUNTER — Ambulatory Visit: Payer: Medicare Other | Attending: Internal Medicine

## 2019-09-25 DIAGNOSIS — Z20822 Contact with and (suspected) exposure to covid-19: Secondary | ICD-10-CM

## 2019-09-26 LAB — NOVEL CORONAVIRUS, NAA: SARS-CoV-2, NAA: NOT DETECTED

## 2019-09-28 ENCOUNTER — Ambulatory Visit: Payer: Medicare Other | Admitting: Cardiothoracic Surgery

## 2019-11-02 ENCOUNTER — Other Ambulatory Visit: Payer: Self-pay

## 2019-11-02 ENCOUNTER — Ambulatory Visit (INDEPENDENT_AMBULATORY_CARE_PROVIDER_SITE_OTHER): Payer: Medicare Other | Admitting: Cardiothoracic Surgery

## 2019-11-02 VITALS — BP 135/78 | HR 61 | Temp 97.7°F | Resp 20 | Ht 72.0 in | Wt 204.0 lb

## 2019-11-02 DIAGNOSIS — I712 Thoracic aortic aneurysm, without rupture: Secondary | ICD-10-CM

## 2019-11-02 DIAGNOSIS — I7121 Aneurysm of the ascending aorta, without rupture: Secondary | ICD-10-CM

## 2019-11-02 NOTE — Progress Notes (Signed)
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 The visualized ureters and urinary bladder appear unremarkable. Stomach/Bowel: There is postsurgical changes of right hemicolectomy with a right lower quadrant ileostomy. Oral contrast opacifies the stomach and multiple loops of small bowel and traversing into the ileostomy. There is no evidence of bowel obstruction. No active inflammatory changes of the small bowel. Contrast load distal colon and rectum presumably from prior study or related to rectal administration. Mild thickened appearance of the colonic mucosa similar to prior CT. There is diffuse omental edema with overall interval improvement compared to the prior CT. No drainable fluid collection or abscess identified. Vascular/Lymphatic: There is moderate aortoiliac atherosclerotic disease. There is no aneurysmal dilatation or evidence of dissection. The origins of the mesenteric vasculature appear patent. The SMV, splenic vein, and main portal vein are patent. No portal venous gas identified. There is no adenopathy. Reproductive: The prostate and seminal  vesicles appear unremarkable. Other: Postoperative changes of the anterior abdominal wall with a midline vertical anterior abdominal wall incisional scar. Abutment of loops of small bowel to the anterior peritoneal wall in the midline may represent adhesions. Musculoskeletal: Mild degenerative changes of the spine. No acute fracture. L2 hemangioma. IMPRESSION: 1. Postoperative changes of right hemicolectomy. Interval resolution of the previously seen pneumoperitoneum and ascites. Residual inflammatory changes of the omental with interval improvement compared to prior study. No drainable fluid collection or abscess. 2. No evidence of bowel obstruction. Contrast traverses into the ostomy. 3. Abutment of loops of small bowel to the anterior peritoneal wall in the midline likely representing a degree of adhesion. 4. Splenomegaly. 5. Mild-to-moderate renal parenchyma atrophy and cortical irregularity. Small nonobstructing bilateral renal calculi as well as probable bilateral small renal cysts. No hydronephrosis. Electronically Signed   By: Anner Crete M.D.   On: 10/15/2016 02:43   Mr Angiogram Chest W Wo Contrast  Result Date: 03/11/2016 CLINICAL DATA:  78 year old male with a history of ascending thoracic aortic aneurysm. EXAM: MRA CHEST WITH OR W  ITHOUT CONTRAST TECHNIQUE: Angiographic images of the chest were obtained using MRA technique without and with intravenous contrast. CONTRAST:  32m MULTIHANCE GADOBENATE DIMEGLUMINE 529 MG/ML IV SOLN COMPARISON:  Most recent prior MRA chest 09/27/2015 FINDINGS: VASCULAR Unchanged dilatation of the aortic root measuring up to 5.3 cm at the sinuses of Valsalva (best measured on the sagittally reformatted images). This is unchanged compared to my measurement on the prior study. Similarly, the aortic root measures approximately 5.1 cm on the axial images although this is likely an underestimation. Aneurysmal dilatation of the tubular portion of the ascending thoracic  aorta is also insignificantly changed at 4.3 cm. No effacement of the sino-tubular junction. No evidence of arch or descending aortic dilatation or aneurysm. No dissection. Conventional 3 vessel arch anatomy. The heart is normal in size. No pericardial effusion. The main and central pulmonary arteries are normal in size. No central pulmonary embolus. NON VASCULAR No focal signal abnormality or abnormal enhancement in the mediastinum, lungs, visualized upper abdomen or musculoskeletal structures. There is metallic artifact in the region of the left glenohumeral joint secondary to a left shoulder arthroplasty prosthesis. IMPRESSION: 1. Stable dilatation of the aortic root measuring up to 5.3 cm at the sinuses of Valsalva (best measured on the sagittally reformatted images). On the axial images, the aortic root measures 5.1 cm which is also unchanged compared to prior imaging. 2. Stable fusiform aneurysmal dilatation of the tubular portion of the  ascending thoracic aorta at 4.3 cm. Signed, Criselda Peaches, MD [facility] Electronically Signed   By: Jacqulynn Cadet M.D.   On: 03/11/2016 10:53   Mr Angiogram Chest W Wo Contrast  09/27/2015  CLINICAL DATA:  F/U for ascending aortic aneurysm. No symtoms. Hx of skin cancer. EXAM: MRA CHEST WITH OR WITHOUT CONTRAST TECHNIQUE: Angiographic images of the chest were obtained using MRA technique without and with intravenous contrast. CONTRAST:  62m MULTIHANCE GADOBENATE DIMEGLUMINE 529 MG/ML IV SOLN (Half dose OK'd per Dr. KMaryland Pinkdue to GFR of 37). COMPARISON:  None. FINDINGS: Thoracic aorta shows no significant atheromatous irregularity, dissection, or stenosis. Classic 3 vessel brachiocephalic arterial origin anatomy without proximal stenosis. Maximum transverse diameters as follows: 5.1 cm sinuses of Valsalva (previously 5.1 cm) 4.1 cm sino-tubular junction 4.1 cm mid ascending 3.9 cm distal ascending/  proximal arch 3.4 cm distal arch 3.5 cm proximal descending 3 x 2.6 cm distal descending above the diaphragm Central pulmonary arteries unremarkable. No axillary, anterior endplate spurring in the mid thoracic spine. Limited visualized portions of the upper abdomen grossly unremarkable. Mediastinal or hilar adenopathy. No pleural or pericardial effusion. IMPRESSION: 1. Stable aortic root dilatation, 5.1 cm sinuses of Valsalva, without complicating features. Electronically Signed   By: DLucrezia EuropeM.D.   On: 09/27/2015 13:57   Mr Angiogram Chest W Wo Contrast  03/22/2014   CLINICAL DATA:  SIZE OF THORACIC AORTA (THORACIC ANEURYSM)  EXAM: MRA CHEST WITH OR WITHOUT CONTRAST  TECHNIQUE: Angiographic images of the chest were obtained using MRA technique without and with intravenous contrast.  [facility]  [address]  Results:  BUN 12 mg/dL,  Creatinine 1.6 mg/dL.  CONTRAST:  95mMULTIHANCE GADOBENATE DIMEGLUMINE 529 MG/ML IV SOLN  COMPARISON:  08/28/2013 and earlier studies  FINDINGS: The thoracic aorta is dilated to a diameter of 5.1 cm at the level of the sinuses of Valsalva (stable by my measurement), 4.4 cm at the sino-tubular junction, 4.2 cm distal ascending/ proximal arch, 3.5 cm distal arch/proximal descending, 2.7 cm distal descending above the diaphragm. No evidence of dissection or stenosis. Classic 3 vessel brachiocephalic arterial origin anatomy without proximal stenosis. No significant atheromatous irregularity. No pleural or pericardial effusion. No hilar or mediastinal adenopathy. Visualized portions of upper abdomen and proximal abdominal aorta unremarkable. Usual degenerative spurring at multiple contiguous levels in the mid thoracic spine.  IMPRESSION: 1. Stable 5.1 cm ascending aortic aneurysm without complicating features.   Electronically Signed   By: DaArne Cleveland.D.   On: 03/22/2014 10:51    Mr Angiogram Chest W Wo Contrast   08/28/2013   CLINICAL DATA:  Aortic aneurysm  EXAM: MRA CHEST WITH OR WITHOUT CONTRAST  TECHNIQUE: Angiographic images of the chest were obtained using MRA technique without and with intravenous contrast. Gated sagittal oblique images through the aortic arch were obtained.  CONTRAST:  1761mULTIHANCE GADOBENATE DIMEGLUMINE 529 MG/ML IV SOLN  COMPARISON:  09/05/2012 and earlier studies  FINDINGS: The thoracic aorta is dilated to a diameter of 5.1 cm at the level the sinuses of Valsalva (stable by my measurement), 4.5 cm at the sino-tubular junction, 4.2 cm mid ascending, 3.6 cm proximal arch, 3.2 cm distal arch, 3.0 cm proximal descending, 2.6 cm distal descending above the diaphragm. No evidence of dissection or stenosis. Classic 3 vessel brachiocephalic arterial origin anatomy without proximal stenosis. No significant atheromatous irregularity.  No pleural or pericardial  effusion. No hilar or mediastinal adenopathy. Visualized portions of upper abdomen and proximal abdominal aorta unremarkable. Usual endplate spurring at multiple contiguous levels in the mid thoracic spine.  IMPRESSION: 1. Stable ascending aortic aneurysm without complicating features.   Electronically Signed   By: Arne Cleveland M.D.   On: 08/28/2013 12:29      Recent Lab Findings: Lab Results  Component Value Date   WBC 6.4 03/14/2019   HGB 13.5 03/14/2019   HCT 44.8 03/14/2019   PLT 108 (L) 03/14/2019   GLUCOSE 112 (H) 03/14/2019   CHOL 93 04/19/2015   TRIG 143 09/07/2016   HDL 31.60 (L) 04/19/2015   LDLDIRECT 28.0 04/19/2015   ALT 177 (H) 03/14/2019   AST 119 (H) 03/14/2019   NA 141 03/14/2019   K 3.8 03/14/2019   CL 99 03/14/2019   CREATININE 2.58 (H) 03/14/2019   BUN 39 (H) 03/14/2019   CO2 30 03/14/2019   TSH 4.090 05/02/2018   INR 1.04 06/02/2017   HGBA1C 5.0 07/22/2017   ECHOCARDIOGRAM REPORT     Patient Name:  Derek [name] Date of Exam: 09/06/2019  Medical Rec #: [id number]     Height:     70.0 in  Accession #:  [id number]     Weight:    198.0 lb  Date of Birth: 11/16/1940     BSA:     2.08 m  Patient Age:  63 years      BP:      124/70 mmHg  Patient Gender: M         HR:      64 bpm.  Exam Location: [facility]   Procedure: 2D Echo, 3D Echo, Cardiac Doppler, Color Doppler and Strain  Analysis   Indications:  I71.2 Ascending aortic aneurysm.    History:    Patient has prior history of Echocardiogram examinations,  most         recent 11/25/2016. Cardiomyopathy, CAD, Ascending aortic         aneurysm, Arrythmias:Atrial Fibrillation and Bradycardia;  Risk         Factors:Hypertension and Former Smoker. CKD stage 3.  Pulmonary         embolus.    Sonographer:  Jessee Avers, RDCS  Referring Phys: [phone] Lilia Argue [id number]   IMPRESSIONS    1. Left ventricular ejection fraction, by visual estimation, is 60 to  65%. The left ventricle has normal function. There is no left ventricular  hypertrophy.  2. Abnormal septal motion consistent with left bundle branch block.  3. The left ventricle has no regional wall motion abnormalities.  4. Global right ventricle has normal systolic function.The right  ventricular size is normal. No increase in right ventricular wall  thickness.  5. Left atrial size was mildly dilated.  6. Right atrial size was normal.  7. The mitral valve is normal in structure. No evidence of mitral valve  regurgitation. No evidence of mitral stenosis.  8. The tricuspid valve is normal in structure.  9. The tricuspid valve is normal in structure. Tricuspid valve  regurgitation is not demonstrated.  10. The aortic valve is tricuspid. Aortic valve regurgitation is mild. No  evidence of aortic valve sclerosis or stenosis.  11. The pulmonic valve was normal in structure. Pulmonic valve  regurgitation is not visualized.  12. 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  13. The inferior vena cava is normal in size with greater than 50%  respiratory variability, suggesting  right atrial pressure of 3 mmHg.  14. Aortic root 62m. Consider CTA of aorta for correlation of  measurement.  15. The average left ventricular global longitudinal strain is -18.8 %.   In comparison to the previous echocardiogram(s): 11/25/16 EF 55-60%. Aortic  root dimension 418m  FINDINGS  Left Ventricle: Left ventricular ejection fraction, by visual estimation,  is 60 to 65%. The left ventricle has normal function. The average left  ventricular global longitudinal strain is -18.8 %. The left ventricle has  no regional wall motion  abnormalities. There is no left ventricular hypertrophy. Abnormal  (paradoxical) septal motion, consistent with left bundle branch block.  Normal left atrial pressure.   Right Ventricle: The right ventricular size is normal. No increase in  right ventricular wall thickness. Global RV systolic function is has  normal systolic function.   Left Atrium: Left atrial size was mildly dilated.   Right Atrium: Right atrial size was normal in size   Pericardium: There is no evidence of pericardial effusion.   Mitral Valve: The mitral valve is normal in structure. No evidence of  mitral valve regurgitation. No evidence of mitral valve stenosis by  observation.   Tricuspid Valve: The tricuspid valve is normal in structure. Tricuspid  valve regurgitation is not demonstrated.   Aortic Valve: The aortic valve is tricuspid. . There is mild thickening  and mild calcification of the aortic valve. Aortic valve regurgitation is  mild. Aortic regurgitation PHT measures 597 msec. The aortic valve is  structurally normal, with no evidence  of sclerosis or stenosis. There is mild thickening of the aortic valve.  There is mild calcification of the aortic valve.   Pulmonic Valve: The pulmonic valve was normal in structure. Pulmonic  valve  regurgitation is not visualized. Pulmonic regurgitation is not visualized.   Aorta: The aortic root, ascending aorta and aortic arch are all  structurally normal, with no evidence of dilitation or obstruction. 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 The aneurysm  measures 50 mm. Ascending aorta distal to the  sinotubular junction is 4430m  Venous: The inferior vena cava is normal in size with greater than 50%  respiratory variability, suggesting right atrial pressure of 3 mmHg.   IAS/Shunts: No atrial level shunt detected by color flow Doppler. There is  no evidence of a patent foramen ovale. No ventricular septal defect is  seen or detected. There is no evidence of an atrial septal defect.     LEFT VENTRICLE  PLAX 2D  LVIDd:     4.60 cm Diastology  LVIDs:     3.10 cm LV e' lateral:  7.35 cm/s  LV PW:     1.10 cm LV E/e' lateral: 7.1  LV IVS:    1.30 cm LV e' medial:  3.30 cm/s  LVOT diam:   2.40 cm LV E/e' medial: 15.8  LV SV:     59 ml  LV SV Index:  27.96  2D Longitudinal Strain  LVOT Area:   4.52 cm 2D Strain GLS (A2C):  -17.4 %             2D Strain GLS (A3C):  -19.4 %             2D Strain GLS (A4C):  -19.6 %             2D Strain GLS Avg:   -18.8 %               3D  Volume EF:             3D EF:    50 %             LV EDV:    191 ml             LV ESV:    96 ml             LV SV:    95 ml   RIGHT VENTRICLE  RV Basal diam: 4.50 cm  RV Mid diam:  3.70 cm  RV S prime:   10.20 cm/s  TAPSE (M-mode): 1.8 cm   LEFT ATRIUM       Index    RIGHT ATRIUM      Index  LA diam:    2.90 cm 1.40 cm/m RA Pressure: 3.00 mmHg  LA Vol (A2C):  93.6 ml 45.03 ml/m RA Area:   20.90 cm  LA Vol (A4C):  63.3 ml 30.46 ml/m RA Volume:  64.10 ml 30.84 ml/m  LA Biplane  Vol: 80.0 ml 38.49 ml/m  AORTIC VALVE  LVOT Vmax:  110.00 cm/s  LVOT Vmean: 68.700 cm/s  LVOT VTI:  0.277 m  AI PHT:   597 msec    AORTA  Ao Root diam: 5.00 cm  Ao Asc diam: 4.40 cm   MITRAL VALVE            TRICUSPID VALVE                   Estimated RAP: 3.00 mmHg    MV Decel Time: 327 msec       SHUNTS  MV E velocity: 52.30 cm/s 103 cm/s Systemic VTI: 0.28 m  MV A velocity: 64.60 cm/s 70.3 cm/s Systemic Diam: 2.40 cm  MV E/A ratio: 0.81    1.5     Candee Furbish MD  Electronically signed by Candee Furbish MD  Signature Date/Time: 09/06/2019/11:55:40 AM         Chronic Kidney Disease   Stage I     GFR >90  Stage II    GFR 60-89  Stage IIIA GFR 45-59  Stage IIIB GFR 30-44  Stage IV   GFR 15-29  Stage V    GFR  <15  Lab Results  Component Value Date   CREATININE 2.58 (H) 03/14/2019   CrCl cannot be calculated (Patient's most recent lab result is older than the maximum 21 days allowed.).   Aortic Size Index=      5.0   /Body surface area is 2.13 meters squared. =2.34  < 2.75 cm/m2      4% risk per year 2.75 to 4.25          8% risk per year > 4.25 cm/m2    20% risk per year  Assessment / Plan: #1 mild dilatation of mid a sending aorta, moderate dilatation of aortic root 4.8 to 5 cm without significant  aortic insufficiency, trileaflet aortic valve  no family history of Aortic dissection or sudden unexplained death at young age.(sister with brain aneurysm and mother ruptured AAA).   He's now on anticoagulation with Eliquis due to atrial fibrillation -followed by cardiology   Plan to see back with CT of the chest no contrast to measure size of aorta August 2021   Grace Isaac MD      [address] [place] [address] Office [phone]   Beeper [phone]

## 2019-12-05 ENCOUNTER — Ambulatory Visit (HOSPITAL_COMMUNITY)
Admission: RE | Admit: 2019-12-05 | Discharge: 2019-12-05 | Disposition: A | Discharge status: Home / Self Care | Payer: Medicare Other | Source: Ambulatory Visit | Attending: Internal Medicine | Admitting: Internal Medicine

## 2019-12-05 ENCOUNTER — Other Ambulatory Visit (HOSPITAL_COMMUNITY): Payer: Self-pay | Admitting: Internal Medicine

## 2019-12-05 ENCOUNTER — Other Ambulatory Visit: Payer: Self-pay

## 2019-12-05 DIAGNOSIS — S2232XA Fracture of one rib, left side, initial encounter for closed fracture: Secondary | ICD-10-CM | POA: Diagnosis not present

## 2019-12-05 DIAGNOSIS — R0789 Other chest pain: Secondary | ICD-10-CM | POA: Diagnosis not present

## 2019-12-05 DIAGNOSIS — Z6827 Body mass index (BMI) 27.0-27.9, adult: Secondary | ICD-10-CM | POA: Diagnosis not present

## 2019-12-05 DIAGNOSIS — E663 Overweight: Secondary | ICD-10-CM | POA: Diagnosis not present

## 2019-12-05 DIAGNOSIS — I10 Essential (primary) hypertension: Secondary | ICD-10-CM | POA: Diagnosis not present

## 2019-12-05 DIAGNOSIS — N184 Chronic kidney disease, stage 4 (severe): Secondary | ICD-10-CM | POA: Diagnosis not present

## 2019-12-20 NOTE — Progress Notes (Signed)
CARDIOLOGY OFFICE NOTE  Date:  12/25/2019    Derek [name] Date of Birth: [birth year] Medical Record #[id number]  PCP:  Sharilyn Sites, MD  Cardiologist:  [facility]  Chief Complaint  Patient presents with  . Follow-up    History of Present Illness: Derek [name] is a 79 y.o. male who presents today for a one week check. Seen for Dr. Marlou Porch - has also seen Dr. Saunders Revel in the past as well.   He has a history of PAF, NICM, aortic root aneurysm followed by EBG, Crohn's disease with prior colectomy, colon cancer, remote PE and CKD - followed by [facility].   Had a telehealth visit with Dr. Marlou Porch last year - felt to be doing ok. Echo was updated in February of 2021.   The patient does not have symptoms concerning for COVID-19 infection (fever, chills, cough, or new shortness of breath).   Comes in today. Here alone. He is upset about our visitor policy. Does not know his medicines. His wife takes care of all that - he gets his medicines thru the [place]. He has been vaccinated. He says he is not having chest pain. Not short of breath. Gets a little dizzy if he gets up too fast. No falls. He did have some rib fractures noted last week - says this happened while working in his flowers - he did not fall.   Past Medical History:  Diagnosis Date  . Allergy   . Anemia   . Anxiety   . Aortic insufficiency    a. mild-mod by echo 09/2015.  . Arthritis    "knees; left shoulder" (09/21/2013)  . Ascending aortic aneurysm (HCC)    a. last measurement 5.3 cm 03/2016 -> f/u planned 09/2015 to continue to follow.  . Atrial fibrillation ([place])   . B12 deficiency    takes Vit 12 shot every 14days   . Bowel perforation ([place]) 08/27/2016  . Cataract   . CKD (chronic kidney disease) stage 3, GFR 30-59 ml/min 08/22/2011  . Clotting disorder ([place])   . Colonic ischemia ([place]) 08/27/2016  . Crohn's disease ([place])   . Depression   . Enlarged prostate   . Enteric hyperoxaluria 02/21/2016  . GERD  (gastroesophageal reflux disease)    takes Omeprazole daily  . Gout    takes Uloric and Colchicine daily  . Heart murmur   . Hepatitis C 1978   negtive RNA load - spontaneously cleared  . Hiatal hernia   . High output ileostomy ([place]) 06/02/2017  . History of blood transfusion 1978; 1990's; ?   "w/bowel resection; S/P allupurinol; ?" (09/21/2013)  . History of colon polyps   . History of kidney stones   . History of MRSA infection 2010  . History of pulmonary embolism 2006   both legs and both lungs /notes 08/26/2008 (09/21/2013)  . History of small bowel obstruction   . History of staph infection 1978  . Hyperoxaluria    Intestinal  . Hypertension   . Insomnia    takes Trazodone nightly  . Internal hemorrhoids   . LV dysfunction    a. h/o EF 45-50% in 2015, normalized on subsequent echoes.  . Nephrolithiasis   . Nocardia infection   . Pancreatitis 2010   elevated lipase and amylase, stranding in tail of pancreas, ? from Humira  . Pancytopenia    Hx of  . Peripheral neuropathy    takes Gabapentin daily  . Pneumonia    hx of   .  Post-traumatic stress syndrome    takes Paxil nightly  . PTSD (post-traumatic stress disorder)   . Pulmonary nodule    a. 75m by CT 05/2015, recommended f/u 6-12 months.  . RLS (restless legs syndrome)   . Rosacea conjunctivitis(372.31)    takes Minocin daily  . Secondary hyperparathyroidism ([place] 02/21/2016  . Sinus bradycardia   . Skin cancer    "cut/burned off left ear and face" (09/21/2013)  . Small bowel obstruction ([place]   . Status post reversal of ileostomy 07/23/2017  . Thrombocytopenia ([place]    hx of    Past Surgical History:  Procedure Laterality Date  . ANKLE SURGERY Right   . APPENDECTOMY  1978  . BOWEL RESECTION  1978 X 2  . CARDIOVERSION N/A 05/29/2016   Procedure: CARDIOVERSION;  Surgeon: MSanda Klein MD;  [facility]: [facility];  Service: Cardiovascular;  Laterality: N/A;  . CHOLECYSTECTOMY    . Colon Cancer    .  COLON RESECTION N/A 08/14/2016   Procedure: LAPAROSCOPIC RESECTION TRANSVERSE COLON;  Surgeon: DAlphonsa Overall MD;  Location: [facility];  Service: General;  Laterality: N/A;  . COLON SURGERY    . COLONOSCOPY    . ESOPHAGOGASTRODUODENOSCOPY    . EYE SURGERY     cataract surgery bilateral  . FOOT SURGERY Right    "took gout out"  . HEMICOLECTOMY Right   . ILEOCECETOMY  1978   /[name]10/03/2000  (09/21/2013)  . ILEOSTOMY    . ILEOSTOMY CLOSURE N/A 07/23/2017   Procedure: ILEOSTOMY REVERSAL ;  Surgeon: NAlphonsa Overall MD;  Location: [facility];  Service: General;  Laterality: N/A;  . INGUINAL HERNIA REPAIR Right   . IR FLUORO GUIDE CV LINE RIGHT  05/07/2017  . IR REMOVAL TUN CV CATH W/O FL  10/08/2017  . IR [place] VASC ACCESS RIGHT  05/07/2017  . KNEE ARTHROSCOPY Left   . LAPAROTOMY N/A 08/27/2016   Procedure: EXPLORATORYLAPAROTOMY, LYSIS OF ADHESIONS, ILEOSTOMY, RIGHT COLECTOMY;  Surgeon: DAlphonsa Overall MD;  Location: [facility];  Service: General;  Laterality: N/A;  . LIGAMENT REPAIR Left   . POLYPECTOMY    . TEE WITHOUT CARDIOVERSION N/A 05/29/2016   Procedure: TRANSESOPHAGEAL ECHOCARDIOGRAM (TEE);  Surgeon: MSanda Klein MD;  [facility]  Service: Cardiovascular;  Laterality: N/A;  . TOTAL SHOULDER ARTHROPLASTY Left 09/21/2013  . TOTAL SHOULDER ARTHROPLASTY Left 09/21/2013   Procedure: LEFT TOTAL SHOULDER ARTHROPLASTY;  Surgeon: KMarin Shutter MD;  [facility]  Service: Orthopedics;  Laterality: Left;     Medications: Current Meds  Medication Sig  . Acidophilus Lactobacillus CAPS Take 1 capsule by mouth daily.  .Marland Kitchenamiodarone (PACERONE) 200 MG tablet Take 1 tablet (200 mg total) by mouth daily.  . calcium carbonate (TUMS - DOSED IN MG ELEMENTAL CALCIUM) 500 MG chewable tablet Chew 2 tablets by mouth 2 (two) times daily.  . Cholecalciferol (VITAMIN D) 2000 units CAPS Take 2,000 Units by mouth daily.  . cyanocobalamin (,VITAMIN B-12,) 1000 MCG/ML injection Inject 1,000 mcg into the  muscle. THREE TIMES A MONTH  . ELIQUIS 5 MG TABS tablet Take 1 tablet (5 mg total) by mouth 2 (two) times daily.  . famotidine (PEPCID) 20 MG tablet Take 20 mg by mouth daily.  . febuxostat (ULORIC) 40 MG tablet Take 40 mg by mouth daily.  . ferrous sulfate 325 (65 FE) MG tablet Take 325 mg by mouth daily with breakfast.  . gabapentin (NEURONTIN) 100 MG capsule Take one (1) capsules (100 mg) by mouth each morning  and 379m at bedtime.  .Marland KitchenHYDROcodone-acetaminophen (NORCO) 10-325 MG tablet Take 1-2 tablets by mouth 4 (four) times daily as needed.  .Marland Kitchenlevothyroxine (SYNTHROID) 50 MCG tablet Take 50 mcg by mouth daily before breakfast.  . magnesium oxide (MAGNESIUM-OXIDE) 400 (241.3 Mg) MG tablet Take 1,200 mg by mouth 2 (two) times daily.   . Multiple Vitamin (MULTIVITAMIN WITH MINERALS) TABS tablet Take 1 tablet by mouth daily.  .Marland KitchenPARoxetine (PAXIL) 20 MG tablet Take 20 mg by mouth daily.   . sodium bicarbonate 650 MG tablet Take 1,300 mg by mouth 2 (two) times daily.   . traZODone (DESYREL) 100 MG tablet Take 100 mg by mouth at bedtime.     Allergies: Allergies  Allergen Reactions  . Lorazepam Other (See Comments)    Reaction:  Hallucinations   . Humira [Adalimumab] Other (See Comments)    Pt states that he got pancreatitis.    . Quinolones     Patient was warned about not using Cipro and similar antibiotics. Recent studies have raised concern that fluoroquinolone antibiotics could be associated with an increased risk of aortic aneurysm Fluoroquinolones have non-antimicrobial properties that might jeopardise the integrity of the extracellular matrix of the vascular wall In a  propensity score matched cohort study in [place] there was a 66% increased rate of aortic aneurysm or dissection associated with oral fluoroquinolone use, compared wit    Social History: The patient  reports that he quit smoking about 44 years ago. His smoking use included cigarettes. He has a 40.00 pack-year  smoking history. He quit smokeless tobacco use about 41 years ago.  His smokeless tobacco use included chew. He reports that he does not drink alcohol or use drugs.   Family History: The patient's family history includes Aneurysm in his mother and sister; Hypertension in his father; Kidney disease in his father.   Review of Systems: Please see the history of present illness.   All other systems are reviewed and negative.   Physical Exam: VS:  BP 138/80   Pulse (!) 57   Ht 6' (1.829 m)   Wt 203 lb 1.9 oz (92.1 kg)   SpO2 97%   BMI 27.55 kg/m  .  BMI Body mass index is 27.55 kg/m.  Wt Readings from Last 3 Encounters:  12/25/19 203 lb 1.9 oz (92.1 kg)  11/02/19 204 lb (92.5 kg)  08/09/19 198 lb (89.8 kg)    General: Alert and in no acute distress.   Neck: Supple, no JVD, carotid bruits, or masses noted.  Cardiac: Regular rate and rhythm. Heart tones are distant.  No edema.  Respiratory:  Lungs are fairly clear to auscultation bilaterally with normal work of breathing.  GI: Soft and nontender.  MS: No deformity or atrophy. Gait and ROM intact.  Skin: Warm and dry. Color is normal.  Neuro:  Strength and sensation are intact and no gross focal deficits noted.  Psych: Alert, appropriate and with normal affect.   LABORATORY DATA:  EKG:  EKG is ordered today.  Personally reviewed by me. This demonstrates sinus bradycardia - HR is 54 - borderline 1st degree AV block - PR is 2081m  Lab Results  Component Value Date   WBC 6.4 03/14/2019   HGB 13.5 03/14/2019   HCT 44.8 03/14/2019   PLT 108 (L) 03/14/2019   GLUCOSE 112 (H) 03/14/2019   CHOL 93 04/19/2015   TRIG 143 09/07/2016   HDL 31.60 (L) 04/19/2015   LDLDIRECT 28.0 04/19/2015   ALT 177 (  H) 03/14/2019   AST 119 (H) 03/14/2019   NA 141 03/14/2019   K 3.8 03/14/2019   CL 99 03/14/2019   CREATININE 2.58 (H) 03/14/2019   BUN 39 (H) 03/14/2019   CO2 30 03/14/2019   TSH 4.090 05/02/2018   PSA 0.2 11/08/2017   INR 1.04  06/02/2017   HGBA1C 5.0 07/22/2017        BNP (last 3 results) No results for input(s): BNP in the last 8760 hours.  ProBNP (last 3 results) No results for input(s): PROBNP in the last 8760 hours.   Other Studies Reviewed Today:  ECHO IMPRESSIONS 09/2019  1. Left ventricular ejection fraction, by visual estimation, is 60 to  65%. The left ventricle has normal function. There is no left ventricular  hypertrophy.  2. Abnormal septal motion consistent with left bundle branch block.  3. The left ventricle has no regional wall motion abnormalities.  4. Global right ventricle has normal systolic function.The right  ventricular size is normal. No increase in right ventricular wall  thickness.  5. Left atrial size was mildly dilated.  6. Right atrial size was normal.  7. The mitral valve is normal in structure. No evidence of mitral valve  regurgitation. No evidence of mitral stenosis.  8. The tricuspid valve is normal in structure.  9. The tricuspid valve is normal in structure. Tricuspid valve  regurgitation is not demonstrated.  10. The aortic valve is tricuspid. Aortic valve regurgitation is mild. No  evidence of aortic valve sclerosis or stenosis.  11. The pulmonic valve was normal in structure. Pulmonic valve  regurgitation is not visualized.  12. 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  13. The inferior vena cava is normal in size with greater than 50%  respiratory variability, suggesting right atrial pressure of 3 mmHg.  14. Aortic root 69m. Consider CTA of aorta for correlation of  measurement.  15. The average left ventricular global longitudinal strain is -18.8 %.   In comparison to the previous echocardiogram(s): 11/25/16 EF 55-60%. Aortic  root dimension 469m   ASSESSMENT & PLAN:    1. PAF - on amiodarone - on anticoagulation as well with Eliquis. Needs surveillance lab today. EKG with sinus noted.   2. Resting bradycardia - he is  not symptomatic.   3. Chronic anticoagulation - needs lab today.   4. ?spontaneous rib fracture - unclear etiology to me but seems worrisome. He did not seem to be too bothered by this.   5. NICM - he has had normalization of his EF. Mild aortic regurgitation noted.   6. Thoracic aortic aneurysm - followed by EBG - last seen last month. For repeat scan later this summer.   7. Hypothyroid   8. CKD - followed by Dr. DeJimmy Footman  9. COVID-19 Education: The signs and symptoms of COVID-19 were discussed with the patient and how to seek care for testing (follow up with PCP or arrange E-visit).  The importance of social distancing, staying at home, hand hygiene and wearing a mask when out in public were discussed today.  He has been vaccinated.   Current medicines are reviewed with the patient today.  The patient does not have concerns regarding medicines other than what has been noted above.  The following changes have been made:  See above.  Labs/ tests ordered today include:    Orders Placed This Encounter  Procedures  . Basic metabolic panel  . CBC  . Hepatic function panel  .  Lipid panel  . TSH  . EKG 12-Lead     Disposition:   FU with [place] in 6 months. Surveillance labs today.    Patient is agreeable to this plan and will call if any problems develop in the interim.   SignedTruitt Merle, NP  12/25/2019 10:49 AM  [facility] [address] [place], [place]  [address] Phone: [phone] Fax: [phone]

## 2019-12-25 ENCOUNTER — Ambulatory Visit (INDEPENDENT_AMBULATORY_CARE_PROVIDER_SITE_OTHER): Payer: Medicare Other | Admitting: Nurse Practitioner

## 2019-12-25 ENCOUNTER — Encounter: Payer: Self-pay | Admitting: Nurse Practitioner

## 2019-12-25 ENCOUNTER — Other Ambulatory Visit: Payer: Self-pay

## 2019-12-25 VITALS — BP 138/80 | HR 57 | Ht 72.0 in | Wt 203.1 lb

## 2019-12-25 DIAGNOSIS — I480 Paroxysmal atrial fibrillation: Secondary | ICD-10-CM | POA: Diagnosis not present

## 2019-12-25 DIAGNOSIS — I712 Thoracic aortic aneurysm, without rupture, unspecified: Secondary | ICD-10-CM

## 2019-12-25 DIAGNOSIS — I428 Other cardiomyopathies: Secondary | ICD-10-CM | POA: Diagnosis not present

## 2019-12-25 DIAGNOSIS — Z79899 Other long term (current) drug therapy: Secondary | ICD-10-CM

## 2019-12-25 DIAGNOSIS — Z7901 Long term (current) use of anticoagulants: Secondary | ICD-10-CM

## 2019-12-25 LAB — BASIC METABOLIC PANEL
BUN/Creatinine Ratio: 8 — ABNORMAL LOW (ref 10–24)
BUN: 15 mg/dL (ref 8–27)
CO2: 23 mmol/L (ref 20–29)
Calcium: 9 mg/dL (ref 8.6–10.2)
Chloride: 105 mmol/L (ref 96–106)
Creatinine, Ser: 1.79 mg/dL — ABNORMAL HIGH (ref 0.76–1.27)
GFR calc Af Amer: 41 mL/min/{1.73_m2} — ABNORMAL LOW (ref >59)
GFR calc non Af Amer: 36 mL/min/{1.73_m2} — ABNORMAL LOW (ref >59)
Glucose: 93 mg/dL (ref 65–99)
Potassium: 4.9 mmol/L (ref 3.5–5.2)
Sodium: 142 mmol/L (ref 134–144)

## 2019-12-25 LAB — HEPATIC FUNCTION PANEL
ALT: 23 IU/L (ref 0–44)
AST: 19 IU/L (ref 0–40)
Albumin: 4.4 g/dL (ref 3.7–4.7)
Alkaline Phosphatase: 128 IU/L — ABNORMAL HIGH (ref 48–121)
Bilirubin Total: 0.5 mg/dL (ref 0.0–1.2)
Bilirubin, Direct: 0.24 mg/dL (ref 0.00–0.40)
Total Protein: 6.6 g/dL (ref 6.0–8.5)

## 2019-12-25 LAB — CBC
Hematocrit: 41.9 % (ref 37.5–51.0)
Hemoglobin: 13.7 g/dL (ref 13.0–17.7)
MCH: 29.9 pg (ref 26.6–33.0)
MCHC: 32.7 g/dL (ref 31.5–35.7)
MCV: 92 fL (ref 79–97)
Platelets: 155 10*3/uL (ref 150–450)
RBC: 4.58 x10E6/uL (ref 4.14–5.80)
RDW: 13.4 % (ref 11.6–15.4)
WBC: 6.5 10*3/uL (ref 3.4–10.8)

## 2019-12-25 LAB — LIPID PANEL
Chol/HDL Ratio: 2.8 ratio (ref 0.0–5.0)
Cholesterol, Total: 105 mg/dL (ref 100–199)
HDL: 38 mg/dL — ABNORMAL LOW (ref >39)
LDL Chol Calc (NIH): 23 mg/dL (ref 0–99)
Triglycerides: 305 mg/dL — ABNORMAL HIGH (ref 0–149)
VLDL Cholesterol Cal: 44 mg/dL — ABNORMAL HIGH (ref 5–40)

## 2019-12-25 LAB — TSH: TSH: 3.87 u[IU]/mL (ref 0.450–4.500)

## 2019-12-25 NOTE — Patient Instructions (Addendum)
After Visit Summary:  We will be checking the following labs today - BMET, CBC, HPF, Lipids and TSH   Medication Instructions:    Continue with your current medicines.    If you need a refill on your cardiac medications before your next appointment, please call your pharmacy.     Testing/Procedures To Be Arranged:  N/A  Follow-Up:   See Dr. Marlou Porch in 6 months.     At [facility], you and your health needs are our priority.  As part of our continuing mission to provide you with exceptional heart care, we have created designated Provider Care Teams.  These Care Teams include your primary Cardiologist (physician) and Advanced Practice Providers (APPs -  Physician Assistants and Nurse Practitioners) who all work together to provide you with the care you need, when you need it.  Special Instructions:  . Stay safe, stay home, wash your hands for at least 20 seconds and wear a mask when out in public.  . It was good to talk with you today.    Call the [place] office at [phone] if you have any questions, problems or concerns.

## 2020-01-17 DIAGNOSIS — K912 Postsurgical malabsorption, not elsewhere classified: Secondary | ICD-10-CM | POA: Diagnosis not present

## 2020-01-17 DIAGNOSIS — N189 Chronic kidney disease, unspecified: Secondary | ICD-10-CM | POA: Diagnosis not present

## 2020-01-17 DIAGNOSIS — N183 Chronic kidney disease, stage 3 unspecified: Secondary | ICD-10-CM | POA: Diagnosis not present

## 2020-01-17 DIAGNOSIS — N2581 Secondary hyperparathyroidism of renal origin: Secondary | ICD-10-CM | POA: Diagnosis not present

## 2020-02-22 ENCOUNTER — Other Ambulatory Visit: Payer: Self-pay | Admitting: Cardiothoracic Surgery

## 2020-02-22 DIAGNOSIS — I712 Thoracic aortic aneurysm, without rupture, unspecified: Secondary | ICD-10-CM

## 2020-03-06 DIAGNOSIS — L309 Dermatitis, unspecified: Secondary | ICD-10-CM | POA: Diagnosis not present

## 2020-03-06 DIAGNOSIS — L738 Other specified follicular disorders: Secondary | ICD-10-CM | POA: Diagnosis not present

## 2020-03-21 ENCOUNTER — Ambulatory Visit (INDEPENDENT_AMBULATORY_CARE_PROVIDER_SITE_OTHER): Payer: Medicare Other | Admitting: Cardiothoracic Surgery

## 2020-03-21 ENCOUNTER — Inpatient Hospital Stay: Admission: RE | Admit: 2020-03-21 | Payer: Medicare Other | Source: Ambulatory Visit

## 2020-03-21 ENCOUNTER — Encounter: Payer: Self-pay | Admitting: Cardiothoracic Surgery

## 2020-03-21 ENCOUNTER — Other Ambulatory Visit: Payer: Self-pay

## 2020-03-21 ENCOUNTER — Ambulatory Visit
Admission: RE | Admit: 2020-03-21 | Discharge: 2020-03-21 | Disposition: A | Discharge status: Home / Self Care | Payer: Medicare Other | Source: Ambulatory Visit | Attending: Cardiothoracic Surgery | Admitting: Cardiothoracic Surgery

## 2020-03-21 VITALS — BP 122/73 | HR 79 | Temp 98.4°F | Resp 20 | Ht 72.0 in | Wt 205.2 lb

## 2020-03-21 DIAGNOSIS — I7121 Aneurysm of the ascending aorta, without rupture: Secondary | ICD-10-CM

## 2020-03-21 DIAGNOSIS — I712 Thoracic aortic aneurysm, without rupture, unspecified: Secondary | ICD-10-CM

## 2020-03-21 DIAGNOSIS — R918 Other nonspecific abnormal finding of lung field: Secondary | ICD-10-CM

## 2020-03-21 DIAGNOSIS — I700 Atherosclerosis of aorta: Secondary | ICD-10-CM | POA: Diagnosis not present

## 2020-03-21 DIAGNOSIS — J479 Bronchiectasis, uncomplicated: Secondary | ICD-10-CM | POA: Diagnosis not present

## 2020-03-21 DIAGNOSIS — I2510 Atherosclerotic heart disease of native coronary artery without angina pectoris: Secondary | ICD-10-CM | POA: Diagnosis not present

## 2020-03-21 NOTE — Progress Notes (Signed)
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 aorta measuring approximately 4.4 cm.  Signed,  Dulcy Fanny. Dellia Nims, RPVI  [facility]  [facility]   Electronically Signed   By: Corrie Mckusick D.O.   On: 02/02/2018 13:22  Mr Angiogram Chest W Wo Contrast  Result Date: 10/29/2016 CLINICAL DATA:  Aortic aneurysm EXAM: MRA CHEST WITH CONTRAST TECHNIQUE: Multiplanar, multiecho pulse sequences of the chest were obtained with intravenous contrast. Angiographic images of chest were obtained using MRA technique with intravenous contrast. CONTRAST:  9 cc MultiHance COMPARISON:  03/11/2016 FINDINGS: Maximal diameter of the ascending aorta at the sinus of all saw above, sino-tubular junction, and ascending aorta are 5.3 cm, 4.4 cm, and 4.4 cm, respectively. This compares with 5.3 cm, 4.4 cm, and 4.3 cm on the prior study respectively. These measurements are not significantly changed. There is no evidence of intramural hematoma or dissection. Great vessels are patent within the confines of the exam. Vertebral arteries are also grossly patent. No obvious pulmonary thromboembolism. No  obvious mediastinal mass effect. There is subsegmental atelectasis towards the lung bases. Visualized abdominal organs are unremarkable. IMPRESSION: Stable aneurysmal dilatation of the ascending aorta at 4.4 cm. Recommend annual imaging followup by CTA or MRA. This recommendation follows 2010 ACCF/AHA/AATS/ACR/ASA/SCA/SCAI/SIR/STS/SVM Guidelines for the Diagnosis and Management of Patients with Thoracic Aortic Disease. Circulation. 2010; 121: S568-L275 Electronically Signed   By: Marybelle Killings M.D.   On: 10/29/2016 16:58    Ct Abdomen Pelvis W Contrast  Result Date: 10/15/2016 CLINICAL DATA:  79 year old male with fever. An status post laparoscopic resection of the transverse colon on 08/14/2016. Burning sensation at the ostomy site. EXAM: CT ABDOMEN AND PELVIS WITH CONTRAST TECHNIQUE: Multidetector CT imaging of the abdomen and pelvis was performed using the standard protocol following bolus administration of intravenous contrast. CONTRAST:  74m ISOVUE-300 IOPAMIDOL (ISOVUE-300) INJECTION 61% COMPARISON:  Abdominal CT dated 09/03/2016 FINDINGS: Lower chest: Bibasilar linear atelectasis/ scarring. The visualized lung bases are otherwise clear. Partially visualized central venous line in the right atrium. No intra-abdominal free air. There is mild diffuse mesenteric and omental edema, improved compared to the prior CT. No free fluid. Hepatobiliary: Cholecystectomy. Mild intrahepatic biliary ductal dilatation versus mild periportal edema. The liver is unremarkable. Pancreas: Unremarkable. No pancreatic ductal dilatation or surrounding inflammatory changes. Spleen: Splenomegaly measuring up to 17 cm in length similar to prior exam. Adrenals/Urinary Tract: The adrenal glands are unremarkable. Bilateral renal cortical atrophy and irregularity. Small bilateral nonobstructing renal calculi measure up to 4 mm in the interpolar aspect of the right kidney. There is no hydronephrosis on either side. Bilateral renal  hypodense lesions measure up to 19 mm along the medial aspect of the inferior pole of the right kidney. The larger lesions represent cysts and the smaller lesions are too small to characterize. There is no hydronephrosis on either side. There is symmetric uptake and excretion of contrast by kidneys bilaterally. The visualized ureters and urinary bladder appear unremarkable. Stomach/Bowel: There is postsurgical changes of right hemicolectomy with a right lower quadrant ileostomy. Oral contrast opacifies the stomach and multiple loops of small bowel and traversing into the ileostomy. There is no evidence of bowel obstruction. No active inflammatory changes of the small bowel. Contrast load distal colon and rectum presumably from prior study or related to rectal administration. Mild thickened appearance of the colonic mucosa similar  to prior CT. There is diffuse omental edema with overall interval improvement compared to the prior CT. No drainable fluid collection or abscess identified. Vascular/Lymphatic: There is moderate aortoiliac atherosclerotic disease. There is no aneurysmal dilatation or evidence of dissection. The origins of the mesenteric vasculature appear patent. The SMV, splenic vein, and main portal vein are patent. No portal venous gas identified. There is no adenopathy. Reproductive: The prostate and seminal vesicles appear unremarkable. Other: Postoperative changes of the anterior abdominal wall with a midline vertical anterior abdominal wall incisional scar. Abutment of loops of small bowel to the anterior peritoneal wall in the midline may represent adhesions. Musculoskeletal: Mild degenerative changes of the spine. No acute fracture. L2 hemangioma. IMPRESSION: 1. Postoperative changes of right hemicolectomy. Interval resolution of the previously seen pneumoperitoneum and ascites. Residual inflammatory changes of the omental with interval improvement compared to prior study. No drainable fluid  collection or abscess. 2. No evidence of bowel obstruction. Contrast traverses into the ostomy. 3. Abutment of loops of small bowel to the anterior peritoneal wall in the midline likely representing a degree of adhesion. 4. Splenomegaly. 5. Mild-to-moderate renal parenchyma atrophy and cortical irregularity. Small nonobstructing bilateral renal calculi as well as probable bilateral small renal cysts. No hydronephrosis. Electronically Signed   By: Anner Crete M.D.   On: 10/15/2016 02:43   Mr Angiogram Chest W Wo Contrast  Result Date: 03/11/2016 CLINICAL DATA:  79 year old male with a history of ascending thoracic aortic aneurysm. EXAM: MRA CHEST WITH OR W  ITHOUT CONTRAST TECHNIQUE: Angiographic images of the chest were obtained using MRA technique without and with intravenous contrast. CONTRAST:  80m MULTIHANCE GADOBENATE DIMEGLUMINE 529 MG/ML IV SOLN COMPARISON:  Most recent prior MRA chest 09/27/2015 FINDINGS: VASCULAR Unchanged dilatation of the aortic root measuring up to 5.3 cm at the sinuses of Valsalva (best measured on the sagittally reformatted images). This is unchanged compared to my measurement on the prior study. Similarly, the aortic root measures approximately 5.1 cm on the axial images although this is likely an underestimation. Aneurysmal dilatation of the tubular portion of the ascending thoracic aorta is also insignificantly changed at 4.3 cm. No effacement of the sino-tubular junction. No evidence of arch or descending aortic dilatation or aneurysm. No dissection. Conventional 3 vessel arch anatomy. The heart is normal in size. No pericardial effusion. The main and central pulmonary arteries are normal in size. No central pulmonary embolus. NON VASCULAR No focal signal abnormality or abnormal enhancement in the mediastinum, lungs, visualized upper abdomen or musculoskeletal structures. There is metallic artifact in the region of the left glenohumeral joint secondary to a left shoulder  arthroplasty prosthesis. IMPRESSION: 1. Stable dilatation of the aortic root measuring up to 5.3 cm at the sinuses of Valsalva (best measured on the sagittally reformatted images). On the axial images, the aortic root measures 5.1 cm which is also unchanged compared to prior imaging. 2. Stable fusiform aneurysmal dilatation of the tubular portion of the ascending thoracic aorta at 4.3 cm. Signed, HCriselda Peaches MD [facility] Electronically Signed   By: HJacqulynn CadetM.D.   On: 03/11/2016 10:53   Mr Angiogram Chest W Wo Contrast  09/27/2015  CLINICAL DATA:  F/U for ascending aortic aneurysm. No symtoms. Hx of skin cancer. EXAM: MRA CHEST WITH OR WITHOUT CONTRAST TECHNIQUE: Angiographic images of the chest were obtained using MRA technique without and with intravenous contrast. CONTRAST:  135mMULTIHANCE GADOBENATE DIMEGLUMINE 529 MG/ML IV SOLN (Half dose OK'd  per Dr. [place] Pink due to GFR of 37). COMPARISON:  None. FINDINGS: Thoracic aorta shows no significant atheromatous irregularity, dissection, or stenosis. Classic 3 vessel brachiocephalic arterial origin anatomy without proximal stenosis. Maximum transverse diameters as follows: 5.1 cm sinuses of Valsalva (previously 5.1 cm) 4.1 cm sino-tubular junction 4.1 cm mid ascending 3.9 cm distal ascending/ proximal arch 3.4 cm distal arch 3.5 cm proximal descending 3 x 2.6 cm distal descending above the diaphragm Central pulmonary arteries unremarkable. No axillary, anterior endplate spurring in the mid thoracic spine. Limited visualized portions of the upper abdomen grossly unremarkable. Mediastinal or hilar adenopathy. No pleural or pericardial effusion. IMPRESSION: 1. Stable aortic root dilatation, 5.1 cm sinuses of Valsalva, without complicating features. Electronically Signed   By: Lucrezia Europe M.D.   On: 09/27/2015 13:57   Mr Angiogram Chest W Wo Contrast  03/22/2014   CLINICAL DATA:  SIZE OF  THORACIC AORTA (THORACIC ANEURYSM)  EXAM: MRA CHEST WITH OR WITHOUT CONTRAST  TECHNIQUE: Angiographic images of the chest were obtained using MRA technique without and with intravenous contrast.  BUN and creatinine were obtained on site at [place] at  [address]  Results:  BUN 12 mg/dL,  Creatinine 1.6 mg/dL.  CONTRAST:  79m MULTIHANCE GADOBENATE DIMEGLUMINE 529 MG/ML IV SOLN  COMPARISON:  08/28/2013 and earlier studies  FINDINGS: The thoracic aorta is dilated to a diameter of 5.1 cm at the level of the sinuses of Valsalva (stable by my measurement), 4.4 cm at the sino-tubular junction, 4.2 cm distal ascending/ proximal arch, 3.5 cm distal arch/proximal descending, 2.7 cm distal descending above the diaphragm. No evidence of dissection or stenosis. Classic 3 vessel brachiocephalic arterial origin anatomy without proximal stenosis. No significant atheromatous irregularity. No pleural or pericardial effusion. No hilar or mediastinal adenopathy. Visualized portions of upper abdomen and proximal abdominal aorta unremarkable. Usual degenerative spurring at multiple contiguous levels in the mid thoracic spine.  IMPRESSION: 1. Stable 5.1 cm ascending aortic aneurysm without complicating features.   Electronically Signed   By: DArne ClevelandM.D.   On: 03/22/2014 10:51    Mr Angiogram Chest W Wo Contrast  08/28/2013   CLINICAL DATA:  Aortic aneurysm  EXAM: MRA CHEST WITH OR WITHOUT CONTRAST  TECHNIQUE: Angiographic images of the chest were obtained using MRA technique without and with intravenous contrast. Gated sagittal oblique images through the aortic arch were obtained.  CONTRAST:  163mMULTIHANCE GADOBENATE DIMEGLUMINE 529 MG/ML IV SOLN  COMPARISON:  09/05/2012 and earlier studies  FINDINGS: The thoracic aorta is dilated to a diameter of 5.1 cm at the level the sinuses of Valsalva (stable by my measurement), 4.5 cm at the sino-tubular junction, 4.2 cm mid ascending, 3.6 cm proximal arch, 3.2 cm  distal arch, 3.0 cm proximal descending, 2.6 cm distal descending above the diaphragm. No evidence of dissection or stenosis. Classic 3 vessel brachiocephalic arterial origin anatomy without proximal stenosis. No significant atheromatous irregularity.  No pleural or pericardial effusion. No hilar or mediastinal adenopathy. Visualized portions of upper abdomen and proximal abdominal aorta unremarkable. Usual endplate spurring at multiple contiguous levels in the mid thoracic spine.  IMPRESSION: 1. Stable ascending aortic aneurysm without complicating features.   Electronically Signed   By: DaArne Cleveland.D.   On: 08/28/2013 12:29      Recent Lab Findings: Lab Results  Component Value Date   WBC 6.5 12/25/2019   HGB 13.7 12/25/2019   HCT 41.9 12/25/2019   PLT 155 12/25/2019   GLUCOSE 93 12/25/2019  CHOL 105 12/25/2019   TRIG 305 (H) 12/25/2019   HDL 38 (L) 12/25/2019   LDLDIRECT 28.0 04/19/2015   LDLCALC 23 12/25/2019   ALT 23 12/25/2019   AST 19 12/25/2019   NA 142 12/25/2019   K 4.9 12/25/2019   CL 105 12/25/2019   CREATININE 1.79 (H) 12/25/2019   BUN 15 12/25/2019   CO2 23 12/25/2019   TSH 3.870 12/25/2019   INR 1.04 06/02/2017   HGBA1C 5.0 07/22/2017   ECHOCARDIOGRAM REPORT     Patient Name:  LAYTON NAVES Date of Exam: 09/06/2019  Medical Rec #: [id number]     Height:    70.0 in  Accession #:  [id number]     Weight:    198.0 lb  Date of Birth: 1941/04/02     BSA:     2.08 m  Patient Age:  62 years      BP:      124/70 mmHg  Patient Gender: M         HR:      64 bpm.  Exam Location: [place]   Procedure: 2D Echo, 3D Echo, Cardiac Doppler, Color Doppler and Strain  Analysis   Indications:  I71.2 Ascending aortic aneurysm.    History:    Patient has prior history of Echocardiogram examinations,  most         recent 11/25/2016. Cardiomyopathy, CAD, Ascending aortic          aneurysm, Arrythmias:Atrial Fibrillation and Bradycardia;  Risk         Factors:Hypertension and Former Smoker. CKD stage 3.  Pulmonary         embolus.    Sonographer:  Jessee Avers, RDCS  Referring Phys: [phone] Lilia Argue GGYIRSWN   IMPRESSIONS    1. Left ventricular ejection fraction, by visual estimation, is 60 to  65%. The left ventricle has normal function. There is no left ventricular  hypertrophy.  2. Abnormal septal motion consistent with left bundle branch block.  3. The left ventricle has no regional wall motion abnormalities.  4. Global right ventricle has normal systolic function.The right  ventricular size is normal. No increase in right ventricular wall  thickness.  5. Left atrial size was mildly dilated.  6. Right atrial size was normal.  7. The mitral valve is normal in structure. No evidence of mitral valve  regurgitation. No evidence of mitral stenosis.  8. The tricuspid valve is normal in structure.  9. The tricuspid valve is normal in structure. Tricuspid valve  regurgitation is not demonstrated.  10. The aortic valve is tricuspid. Aortic valve regurgitation is mild. No  evidence of aortic valve sclerosis or stenosis.  11. The pulmonic valve was normal in structure. Pulmonic valve  regurgitation is not visualized.  12. 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  13. The inferior vena cava is normal in size with greater than 50%  respiratory variability, suggesting right atrial pressure of 3 mmHg.  14. Aortic root 56m. Consider CTA of aorta for correlation of  measurement.  15. The average left ventricular global longitudinal strain is -18.8 %.   In comparison to the previous echocardiogram(s): 11/25/16 EF 55-60%. Aortic  root dimension 455m  FINDINGS  Left Ventricle: Left ventricular ejection fraction, by visual estimation,  is 60 to 65%. The left ventricle has normal function. The average left   ventricular global longitudinal strain is -18.8 %. The left ventricle has  no regional wall motion  abnormalities. There  is no left ventricular hypertrophy. Abnormal  (paradoxical) septal motion, consistent with left bundle branch block.  Normal left atrial pressure.   Right Ventricle: The right ventricular size is normal. No increase in  right ventricular wall thickness. Global RV systolic function is has  normal systolic function.   Left Atrium: Left atrial size was mildly dilated.   Right Atrium: Right atrial size was normal in size   Pericardium: There is no evidence of pericardial effusion.   Mitral Valve: The mitral valve is normal in structure. No evidence of  mitral valve regurgitation. No evidence of mitral valve stenosis by  observation.   Tricuspid Valve: The tricuspid valve is normal in structure. Tricuspid  valve regurgitation is not demonstrated.   Aortic Valve: The aortic valve is tricuspid. . There is mild thickening  and mild calcification of the aortic valve. Aortic valve regurgitation is  mild. Aortic regurgitation PHT measures 597 msec. The aortic valve is  structurally normal, with no evidence  of sclerosis or stenosis. There is mild thickening of the aortic valve.  There is mild calcification of the aortic valve.   Pulmonic Valve: The pulmonic valve was normal in structure. Pulmonic valve  regurgitation is not visualized. Pulmonic regurgitation is not visualized.   Aorta: The aortic root, ascending aorta and aortic arch are all  structurally normal, with no evidence of dilitation or obstruction. 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 The aneurysm  measures 50 mm. Ascending aorta distal to the  sinotubular junction is 41m.   Venous: The inferior vena cava is normal in size with greater than 50%  respiratory variability, suggesting right atrial pressure of 3 mmHg.   IAS/Shunts: No atrial level shunt detected by color flow  Doppler. There is  no evidence of a patent foramen ovale. No ventricular septal defect is  seen or detected. There is no evidence of an atrial septal defect.     LEFT VENTRICLE  PLAX 2D  LVIDd:     4.60 cm Diastology  LVIDs:     3.10 cm LV e' lateral:  7.35 cm/s  LV PW:     1.10 cm LV E/e' lateral: 7.1  LV IVS:    1.30 cm LV e' medial:  3.30 cm/s  LVOT diam:   2.40 cm LV E/e' medial: 15.8  LV SV:     59 ml  LV SV Index:  27.96  2D Longitudinal Strain  LVOT Area:   4.52 cm 2D Strain GLS (A2C):  -17.4 %             2D Strain GLS (A3C):  -19.4 %             2D Strain GLS (A4C):  -19.6 %             2D Strain GLS Avg:   -18.8 %               3D Volume EF:             3D EF:    50 %             LV EDV:    191 ml             LV ESV:    96 ml             LV SV:    95 ml   RIGHT VENTRICLE  RV Basal diam: 4.50 cm  RV Mid diam:  3.70  cm  RV S prime:   10.20 cm/s  TAPSE (M-mode): 1.8 cm   LEFT ATRIUM       Index    RIGHT ATRIUM      Index  LA diam:    2.90 cm 1.40 cm/m RA Pressure: 3.00 mmHg  LA Vol (A2C):  93.6 ml 45.03 ml/m RA Area:   20.90 cm  LA Vol (A4C):  63.3 ml 30.46 ml/m RA Volume:  64.10 ml 30.84 ml/m  LA Biplane Vol: 80.0 ml 38.49 ml/m  AORTIC VALVE  LVOT Vmax:  110.00 cm/s  LVOT Vmean: 68.700 cm/s  LVOT VTI:  0.277 m  AI PHT:   597 msec    AORTA  Ao Root diam: 5.00 cm  Ao Asc diam: 4.40 cm   MITRAL VALVE            TRICUSPID VALVE                   Estimated RAP: 3.00 mmHg    MV Decel Time: 327 msec       SHUNTS  MV E velocity: 52.30 cm/s 103 cm/s Systemic VTI: 0.28 m  MV A velocity: 64.60 cm/s 70.3 cm/s Systemic Diam: 2.40 cm  MV E/A ratio: 0.81    1.5     Candee Furbish MD  Electronically signed by Candee Furbish  MD  Signature Date/Time: 09/06/2019/11:55:40 AM         Chronic Kidney Disease   Stage I     GFR >90  Stage II    GFR 60-89  Stage IIIA GFR 45-59  Stage IIIB GFR 30-44  Stage IV   GFR 15-29  Stage V    GFR  <15  Lab Results  Component Value Date   CREATININE 1.79 (H) 12/25/2019   CrCl cannot be calculated (Patient's most recent lab result is older than the maximum 21 days allowed.). Assessment / Plan:  #1 PAF now on amiodarone and anticoagulation with Eliquis due to atrial fibrillation -followed by cardiology  #2 chronic kidney disease  #3 mild aortic regurgitation  #4 ascending thoracic aortic aneurysm 4.8 cm at the aortic root as measured by echo, ascending aorta noncontrasted CT measures at 4.4 cm.  #5  New ground-glass opacities worse on the RIGHT in the RIGHT middle and upper lobe, also in the superior segment of the RIGHT lower lobe.-After reviewing the patient's CT scan recommended to him he had his wife that they get Covid tested with the development of symptoms and his son, and because of the new groundglass opacities noted on CT scan incidentally on the CT done to evaluate his aorta.  We will plan to see him back in 2 weeks with a follow-up chest x-ray to evaluate the groundglass opacities, follow-up CT of the chest in 8 months to evaluate his aorta.  Grace Isaac MD      [address] [place] [address] Office [phone]   Beeper [phone]

## 2020-04-01 ENCOUNTER — Other Ambulatory Visit (HOSPITAL_COMMUNITY): Payer: Self-pay | Admitting: Family Medicine

## 2020-04-01 ENCOUNTER — Ambulatory Visit (HOSPITAL_COMMUNITY)
Admission: RE | Admit: 2020-04-01 | Discharge: 2020-04-01 | Disposition: A | Discharge status: Home / Self Care | Payer: Medicare Other | Source: Ambulatory Visit | Attending: Family Medicine | Admitting: Family Medicine

## 2020-04-01 ENCOUNTER — Other Ambulatory Visit: Payer: Self-pay

## 2020-04-01 DIAGNOSIS — S2242XA Multiple fractures of ribs, left side, initial encounter for closed fracture: Secondary | ICD-10-CM | POA: Insufficient documentation

## 2020-04-01 DIAGNOSIS — S20212A Contusion of left front wall of thorax, initial encounter: Secondary | ICD-10-CM | POA: Diagnosis not present

## 2020-04-01 DIAGNOSIS — S2232XA Fracture of one rib, left side, initial encounter for closed fracture: Secondary | ICD-10-CM | POA: Diagnosis not present

## 2020-04-01 DIAGNOSIS — E663 Overweight: Secondary | ICD-10-CM | POA: Diagnosis not present

## 2020-04-01 DIAGNOSIS — I700 Atherosclerosis of aorta: Secondary | ICD-10-CM | POA: Diagnosis not present

## 2020-04-01 DIAGNOSIS — Z043 Encounter for examination and observation following other accident: Secondary | ICD-10-CM | POA: Diagnosis not present

## 2020-04-01 DIAGNOSIS — Z6827 Body mass index (BMI) 27.0-27.9, adult: Secondary | ICD-10-CM | POA: Diagnosis not present

## 2020-04-02 ENCOUNTER — Emergency Department (HOSPITAL_COMMUNITY): Payer: No Typology Code available for payment source

## 2020-04-02 ENCOUNTER — Other Ambulatory Visit: Payer: Self-pay

## 2020-04-02 ENCOUNTER — Emergency Department (HOSPITAL_COMMUNITY)
Admission: EM | Admit: 2020-04-02 | Discharge: 2020-04-02 | Disposition: A | Discharge status: Home / Self Care | Payer: No Typology Code available for payment source | Attending: Emergency Medicine | Admitting: Emergency Medicine

## 2020-04-02 ENCOUNTER — Encounter (HOSPITAL_COMMUNITY): Payer: Self-pay

## 2020-04-02 DIAGNOSIS — Z96612 Presence of left artificial shoulder joint: Secondary | ICD-10-CM | POA: Diagnosis not present

## 2020-04-02 DIAGNOSIS — Z87891 Personal history of nicotine dependence: Secondary | ICD-10-CM | POA: Insufficient documentation

## 2020-04-02 DIAGNOSIS — N183 Chronic kidney disease, stage 3 unspecified: Secondary | ICD-10-CM | POA: Diagnosis not present

## 2020-04-02 DIAGNOSIS — Z85038 Personal history of other malignant neoplasm of large intestine: Secondary | ICD-10-CM | POA: Insufficient documentation

## 2020-04-02 DIAGNOSIS — Z79899 Other long term (current) drug therapy: Secondary | ICD-10-CM | POA: Diagnosis not present

## 2020-04-02 DIAGNOSIS — I129 Hypertensive chronic kidney disease with stage 1 through stage 4 chronic kidney disease, or unspecified chronic kidney disease: Secondary | ICD-10-CM | POA: Insufficient documentation

## 2020-04-02 DIAGNOSIS — R930 Abnormal findings on diagnostic imaging of skull and head, not elsewhere classified: Secondary | ICD-10-CM | POA: Diagnosis not present

## 2020-04-02 DIAGNOSIS — I2510 Atherosclerotic heart disease of native coronary artery without angina pectoris: Secondary | ICD-10-CM | POA: Diagnosis not present

## 2020-04-02 DIAGNOSIS — Z85828 Personal history of other malignant neoplasm of skin: Secondary | ICD-10-CM | POA: Diagnosis not present

## 2020-04-02 DIAGNOSIS — R0789 Other chest pain: Secondary | ICD-10-CM | POA: Diagnosis not present

## 2020-04-02 DIAGNOSIS — Z7901 Long term (current) use of anticoagulants: Secondary | ICD-10-CM | POA: Diagnosis not present

## 2020-04-02 HISTORY — DX: Malignant neoplasm of colon, unspecified: C18.9

## 2020-04-02 LAB — TROPONIN I (HIGH SENSITIVITY): Troponin I (High Sensitivity): 5 ng/L (ref <18)

## 2020-04-02 MED ORDER — SODIUM CHLORIDE 0.9 % IV BOLUS: 1000.0000 mL | Freq: Once | INTRAVENOUS | Status: DC

## 2020-04-02 MED ORDER — FENTANYL CITRATE (PF) 100 MCG/2ML IJ SOLN: 50.0000 ug | Freq: Once | INTRAMUSCULAR | Status: DC

## 2020-04-02 MED ORDER — OXYCODONE HCL 5 MG PO TABS: 2.5000 mg | ORAL_TABLET | Freq: Four times a day (QID) | ORAL | 0 refills | Status: DC | PRN

## 2020-04-02 MED ORDER — ONDANSETRON 4 MG PO TBDP
4.0000 mg | ORAL_TABLET | Freq: Once | ORAL | Status: AC
  Administered 2020-04-02: 4 mg via ORAL
  Filled 2020-04-02: qty 1

## 2020-04-02 MED ORDER — ONDANSETRON HCL 4 MG/2ML IJ SOLN: 4.0000 mg | Freq: Once | INTRAMUSCULAR | Status: DC

## 2020-04-02 MED ORDER — OXYCODONE-ACETAMINOPHEN 5-325 MG PO TABS
2.0000 | ORAL_TABLET | Freq: Once | ORAL | Status: AC
  Administered 2020-04-02: 2 via ORAL
  Filled 2020-04-02: qty 2

## 2020-04-02 MED ORDER — LIDOCAINE 5 % EX PTCH: 1.0000 | MEDICATED_PATCH | CUTANEOUS | 0 refills | Status: DC

## 2020-04-02 NOTE — ED Provider Notes (Signed)
[facility] EMERGENCY DEPARTMENT Provider Note   CSN: [id number] Arrival date & time: 04/02/20  9678     History Chief Complaint  Patient presents with  . Fall    Derek [name] is a 79 y.o. male complaint of chest wall pain. The patient has a hx of Ascending thoracic aortic aneurysm. He has a hx of previous rib fractures. The patient fell after getting his feet tangled up 2 days ago. He saw his PCP yesterday who sent him for a cxr which was negative. The patient states that he has had previous rib fractures and this is "so much worse." The patient has severe pain whenever he breaths, changes position, or tries to lift his left arm. He denies any numbness or tingling or weakness in the extremities. He did not hit his head or lose consciousness. He has not taken any medication for pain. He is on Eliquis for Afib.  HPI     Past Medical History:  Diagnosis Date  . Allergy   . Anemia   . Anxiety   . Aortic insufficiency    a. mild-mod by echo 09/2015.  . Arthritis    "knees; left shoulder" (09/21/2013)  . Ascending aortic aneurysm (HCC)    a. last measurement 5.3 cm 03/2016 -> f/u planned 09/2015 to continue to follow.  . Atrial fibrillation ([place])   . B12 deficiency    takes Vit 12 shot every 14days   . Bowel perforation ([place]) 08/27/2016  . Cataract   . CKD (chronic kidney disease) stage 3, GFR 30-59 ml/min 08/22/2011  . Clotting disorder ([place])   . Colon cancer ([place])   . Colonic ischemia ([place]) 08/27/2016  . Crohn's disease ([place])   . Depression   . Enlarged prostate   . Enteric hyperoxaluria 02/21/2016  . GERD (gastroesophageal reflux disease)    takes Omeprazole daily  . Gout    takes Uloric and Colchicine daily  . Heart murmur   . Hepatitis C 1978   negtive RNA load - spontaneously cleared  . Hiatal hernia   . High output ileostomy ([place]) 06/02/2017  . History of blood transfusion 1978; 1990's; ?   "w/bowel resection; S/P allupurinol; ?" (09/21/2013)  . History of colon  polyps   . History of kidney stones   . History of MRSA infection 2010  . History of pulmonary embolism 2006   both legs and both lungs /notes 08/26/2008 (09/21/2013)  . History of small bowel obstruction   . History of staph infection 1978  . Hyperoxaluria    Intestinal  . Hypertension   . Insomnia    takes Trazodone nightly  . Internal hemorrhoids   . LV dysfunction    a. h/o EF 45-50% in 2015, normalized on subsequent echoes.  . Nephrolithiasis   . Nocardia infection   . Pancreatitis 2010   elevated lipase and amylase, stranding in tail of pancreas, ? from Humira  . Pancytopenia    Hx of  . Peripheral neuropathy    takes Gabapentin daily  . Pneumonia    hx of   . Post-traumatic stress syndrome    takes Paxil nightly  . PTSD (post-traumatic stress disorder)   . Pulmonary nodule    a. 36m by CT 05/2015, recommended f/u 6-12 months.  . RLS (restless legs syndrome)   . Rosacea conjunctivitis(372.31)    takes Minocin daily  . Secondary hyperparathyroidism ([place] 02/21/2016  . Sinus bradycardia   . Skin cancer    "cut/burned off left ear  and face" (09/21/2013)  . Small bowel obstruction ([place])   . Status post reversal of ileostomy 07/23/2017  . Thrombocytopenia ([place])    hx of    Patient Active Problem List   Diagnosis Date Noted  . Personal history of colon cancer 08/14/2019  . Elevated LFTs 03/12/2019  . Osteoarthritis of left knee 08/23/2018  . History of gout 08/23/2018  . Thoracic aortic aneurysm without rupture ([place]) 10/26/2017  . Malignant neoplasm of transverse colon ([place])   . Partial small bowel obstruction ([place]) 05/09/2017  . Aortic valve regurgitation 11/05/2016  . Chronic anticoagulation 08/18/2016  . Sinus bradycardia 08/17/2016  . S/P colon resection 08/14/2016  . Adenomatous polyp of transverse colon s/p partial colectomy 08/14/2016 08/14/2016  . Essential hypertension 05/27/2016  . Peripheral neuropathic pain 05/26/2016  . GERD (gastroesophageal reflux  disease) 05/26/2016  . Paroxysmal atrial fibrillation ([place]) 05/26/2016  . Lung nodule 05/12/2016  . SBO (small bowel obstruction) ([place]) 05/11/2016  . Enteric hyperoxaluria 02/21/2016  . Secondary hyperparathyroidism ([place]) 02/21/2016  . Pain in joint, shoulder region 10/12/2013  . Decreased range of motion of left shoulder 10/12/2013  . Muscle weakness (generalized) 10/12/2013  . Restriction of joint motion 10/12/2013  . S/P shoulder replacement 09/21/2013  . Nonischemic cardiomyopathy ([place]) 09/07/2013  . Anxiety 08/22/2011  . CKD (chronic kidney disease) stage 3, GFR 30-59 ml/min (HCC) 08/22/2011  . Thrombocytopenia ([place]) 08/22/2011  . CORONARY ATHEROSCLEROSIS NATIVE CORONARY ARTERY 06/25/2010  . Ascending aortic aneurysm ([place]) 06/26/2009  . Crohn's ileocolitis ([place]) 06/26/2009  . B12 DEFICIENCY 05/09/2008  . Gout 05/09/2008  . POST TRAUMATIC STRESS SYNDROME 05/09/2008  . GERD 05/09/2008  . HEPATITIS C Ab positive RNA neg, HX OF 05/09/2008  . PULMONARY EMBOLISM, HX OF 05/09/2008  . History of small bowel obstruction 05/09/2008    Past Surgical History:  Procedure Laterality Date  . ANKLE SURGERY Right   . APPENDECTOMY  1978  . BOWEL RESECTION  1978 X 2  . CARDIOVERSION N/A 05/29/2016   Procedure: CARDIOVERSION;  Surgeon: Sanda Klein, MD;  Location: [facility];  Service: Cardiovascular;  Laterality: N/A;  . CHOLECYSTECTOMY    . Colon Cancer    . COLON RESECTION N/A 08/14/2016   Procedure: LAPAROSCOPIC RESECTION TRANSVERSE COLON;  Surgeon: [name], MD;  Location: [facility]S;  Service: General;  Laterality: N/A;  . COLON SURGERY    . COLONOSCOPY    . ESOPHAGOGASTRODUODENOSCOPY    . EYE SURGERY     cataract surgery bilateral  . FOOT SURGERY Right    "took gout out"  . HEMICOLECTOMY Right   . ILEOCECETOMY  1978   [name] 05/10/2000  (09/21/2013)  . ILEOSTOMY    . ILEOSTOMY CLOSURE N/A 07/23/2017   Procedure: ILEOSTOMY REVERSAL ;  Surgeon: [name], MD;  Location:  [facility] ORS;  Service: General;  Laterality: N/A;  . INGUINAL HERNIA REPAIR Right   . IR FLUORO GUIDE CV LINE RIGHT  05/07/2017  . IR REMOVAL TUN CV CATH W/O FL  10/08/2017  . IR US GUIDE VASC ACCESS RIGHT  05/07/2017  . KNEE ARTHROSCOPY Left   . LAPAROTOMY N/A 08/27/2016   Procedure: EXPLORATORYLAPAROTOMY, LYSIS OF ADHESIONS, ILEOSTOMY, RIGHT COLECTOMY;  Surgeon: [name], MD;  Location: [facility];  Service: General;  Laterality: N/A;  . LIGAMENT REPAIR Left   . POLYPECTOMY    . TEE WITHOUT CARDIOVERSION N/A 05/29/2016   Procedure: TRANSESOPHAGEAL ECHOCARDIOGRAM (TEE);  Surgeon: Sanda Klein, MD;  Location: [place];  Service: Cardiovascular;  Laterality: N/A;  .  TOTAL SHOULDER ARTHROPLASTY Left 09/21/2013  . TOTAL SHOULDER ARTHROPLASTY Left 09/21/2013   Procedure: LEFT TOTAL SHOULDER ARTHROPLASTY;  Surgeon: Marin Shutter, MD;  Location: [place];  Service: Orthopedics;  Laterality: Left;       Family History  Problem Relation Age of Onset  . Kidney disease Father   . Hypertension Father   . Aneurysm Mother   . Aneurysm Sister   . Esophageal cancer Neg Hx   . Stomach cancer Neg Hx   . Rectal cancer Neg Hx   . Colon cancer Neg Hx     Social History   Tobacco Use  . Smoking status: Former Smoker    Packs/day: 2.00    Years: 20.00    Pack years: 40.00    Types: Cigarettes    Quit date: 08/04/1975    Years since quitting: 44.6  . Smokeless tobacco: Former Systems developer    Types: Chew    Quit date: 08/03/1978  . Tobacco comment: 09/21/2013 "quit smoking in the late 1970's; stopped chewing couple years after I quit smoking"  Vaping Use  . Vaping Use: Never used  Substance Use Topics  . Alcohol use: No  . Drug use: No    Home Medications Prior to Admission medications   Medication Sig Start Date End Date Taking? Authorizing Provider  Acidophilus Lactobacillus CAPS Take 1 capsule by mouth daily. 06/08/16   [name], MD  amiodarone (PACERONE) 200 MG tablet Take 1 tablet (200 mg  total) by mouth daily. 11/05/16   End, Harrell Gave, MD  calcium carbonate (TUMS - DOSED IN MG ELEMENTAL CALCIUM) 500 MG chewable tablet Chew 2 tablets by mouth 2 (two) times daily.    [provider]  Cholecalciferol (VITAMIN D) 2000 units CAPS Take 2,000 Units by mouth daily.    [provider]  cyanocobalamin (,VITAMIN B-12,) 1000 MCG/ML injection Inject 1,000 mcg into the muscle. THREE TIMES A MONTH    [provider]  ELIQUIS 5 MG TABS tablet Take 1 tablet (5 mg total) by mouth 2 (two) times daily. 11/05/16   End, Harrell Gave, MD  famotidine (PEPCID) 20 MG tablet Take 20 mg by mouth daily.    [provider]  febuxostat (ULORIC) 40 MG tablet Take 40 mg by mouth daily.    [provider]  ferrous sulfate 325 (65 FE) MG tablet Take 325 mg by mouth daily with breakfast.    [provider]  gabapentin (NEURONTIN) 100 MG capsule Take one (1) capsules (100 mg) by mouth each morning and 330m at bedtime.    [provider]  HYDROcodone-acetaminophen (NORCO) 10-325 MG tablet Take 1-2 tablets by mouth 4 (four) times daily as needed. 12/05/19   [provider]  levothyroxine (SYNTHROID) 50 MCG tablet Take 50 mcg by mouth daily before breakfast.    [provider]  lidocaine (LIDODERM) 5 % Place 1 patch onto the skin daily. Remove & Discard patch within 12 hours or as directed by MD 04/02/20   HMargarita Mail PA-C  magnesium oxide (MAGNESIUM-OXIDE) 400 (241.3 Mg) MG tablet Take 1,200 mg by mouth 2 (two) times daily.     [provider]  Multiple Vitamin (MULTIVITAMIN WITH MINERALS) TABS tablet Take 1 tablet by mouth daily.    [provider]  oxyCODONE (ROXICODONE) 5 MG immediate release tablet Take 0.5-1 tablets (2.5-5 mg total) by mouth every 6 (six) hours as needed for severe pain. 04/02/20   Devaeh Amadi, AVernie Shanks PA-C  PARoxetine (PAXIL) 20 MG tablet Take  20 mg by mouth daily.     [provider]  sodium  bicarbonate 650 MG tablet Take 1,300 mg by mouth 2 (two) times daily.     [provider]  traZODone (DESYREL) 100 MG tablet Take 100 mg by mouth at bedtime.    [provider]  Calcium Carbonate (CALCIUM 500 PO) Take 2 tablets by mouth 2 (two) times daily.   05/11/16  [provider]    Allergies    Lorazepam, Humira [adalimumab], and Quinolones  Review of Systems   Review of Systems Ten systems reviewed and are negative for acute change, except as noted in the HPI.   Physical Exam Updated Vital Signs BP (!) 156/75   Pulse (!) 54   Temp 97.7 F (36.5 C) (Oral)   Resp (!) 24   Ht 6' (1.829 m)   Wt 93 kg   SpO2 100%   BMI 27.81 kg/m   Physical Exam Vitals and nursing note reviewed.  Constitutional:      General: He is not in acute distress.    Appearance: He is well-developed. He is not diaphoretic.  HENT:     Head: Normocephalic and atraumatic.  Eyes:     General: No scleral icterus.    Conjunctiva/sclera: Conjunctivae normal.  Cardiovascular:     Rate and Rhythm: Normal rate and regular rhythm.     Heart sounds: Normal heart sounds.  Pulmonary:     Effort: Tachypnea present. No respiratory distress.     Comments: Respirations are splinted and shallow.  Chest:       Comments: exquisitely TTP. No obvious bruising or  Abdominal:     Palpations: Abdomen is soft.     Tenderness: There is no abdominal tenderness.  Musculoskeletal:     Cervical back: Normal range of motion and neck supple.  Skin:    General: Skin is warm and dry.  Neurological:     Mental Status: He is alert.  Psychiatric:        Behavior: Behavior normal.     ED Results / Procedures / Treatments   Labs (all labs ordered are listed, but only abnormal results are displayed) Labs Reviewed  TROPONIN I (HIGH SENSITIVITY)    EKG EKG Interpretation  Date/Time:  Tuesday April 02 2020 10:15:27 EDT Ventricular Rate:  72 PR Interval:  200 QRS Duration: 104 QT  Interval:  392 QTC Calculation: 429 R Axis:   -52 Text Interpretation: Normal sinus rhythm Left axis deviation Left ventricular hypertrophy ( R in aVL , Cornell product , Romhilt-Estes ) Abnormal ECG No significant change since 01/28/2017 Confirmed by Veryl Speak [phone]) on 04/02/2020 11:16:30 AM   Radiology DG Chest 2 View  Result Date: 04/02/2020 CLINICAL DATA:  Shortness of breath, fall, left chest pain EXAM: CHEST - 2 VIEW COMPARISON:  04/01/2020 FINDINGS: The heart size and mediastinal contours are within normal limits. Both lungs are clear. Disc degenerative disease of the thoracic spine. IMPRESSION: 1.  No acute abnormality of the lungs. 2. No displaced fracture or other radiographic abnormality of the left ribs. Electronically Signed   By: Eddie Candle M.D.   On: 04/02/2020 10:42   DG Ribs Unilateral W/Chest Left  Result Date: 04/01/2020 CLINICAL DATA:  Fall, left rib pain EXAM: LEFT RIBS AND CHEST - 3+ VIEW COMPARISON:  CT chest dated 03/21/2020 FINDINGS: Mild bibasilar opacities, likely atelectasis. Lungs are otherwise clear. No pleural effusion or pneumothorax. The heart is normal in size.  Thoracic aortic atherosclerosis. Left  shoulder arthroplasty. No displaced left rib fracture is seen. IMPRESSION: Mild bibasilar opacities, likely atelectasis. No displaced left rib fracture is seen. Thoracic aortic atherosclerosis. Electronically Signed   By: Julian Hy M.D.   On: 04/01/2020 08:27   CT Head Wo Contrast  Result Date: 04/02/2020 CLINICAL DATA:  Fall, head trauma, neck pain EXAM: CT HEAD WITHOUT CONTRAST CT CERVICAL SPINE WITHOUT CONTRAST TECHNIQUE: Multidetector CT imaging of the head and cervical spine was performed following the standard protocol without intravenous contrast. Multiplanar CT image reconstructions of the cervical spine were also generated. COMPARISON:  06/03/2017 FINDINGS: CT HEAD FINDINGS Brain: No evidence of acute infarction, hemorrhage, hydrocephalus,  extra-axial collection or mass lesion/mass effect. Scattered low-density changes within the periventricular and subcortical white matter compatible with chronic microvascular ischemic change. Mild diffuse cerebral volume loss. Vascular: Atherosclerotic calcifications involving the large vessels of the skull base. No unexpected hyperdense vessel. Skull: Negative for calvarial fracture or focal lesion. Sinuses/Orbits: No acute finding. Other: No soft tissue swelling or scalp hematoma. CT CERVICAL SPINE FINDINGS Alignment: Facet joints aligned without dislocation. Dens and lateral masses are aligned. Reversal of the cervical lordosis. Skull base and vertebrae: No acute fracture. No primary bone lesion or focal pathologic process. Soft tissues and spinal canal: No prevertebral fluid or swelling. No visible canal hematoma. Disc levels: Multilevel intervertebral disc height loss and degenerative endplate spurring, most pronounced at C5-6 and C6-7. Multilevel bilateral facet and uncovertebral arthropathy. Upper chest: Visualized lung apices are clear. Other: None. IMPRESSION: 1. No acute intracranial findings. 2. No acute fracture or subluxation of the cervical spine. 3. Multilevel degenerative disc and facet arthropathy of the cervical spine. Electronically Signed   By: Davina Poke D.O.   On: 04/02/2020 15:02   CT Cervical Spine Wo Contrast  Result Date: 04/02/2020 CLINICAL DATA:  Fall, head trauma, neck pain EXAM: CT HEAD WITHOUT CONTRAST CT CERVICAL SPINE WITHOUT CONTRAST TECHNIQUE: Multidetector CT imaging of the head and cervical spine was performed following the standard protocol without intravenous contrast. Multiplanar CT image reconstructions of the cervical spine were also generated. COMPARISON:  06/03/2017 FINDINGS: CT HEAD FINDINGS Brain: No evidence of acute infarction, hemorrhage, hydrocephalus, extra-axial collection or mass lesion/mass effect. Scattered low-density changes within the  periventricular and subcortical white matter compatible with chronic microvascular ischemic change. Mild diffuse cerebral volume loss. Vascular: Atherosclerotic calcifications involving the large vessels of the skull base. No unexpected hyperdense vessel. Skull: Negative for calvarial fracture or focal lesion. Sinuses/Orbits: No acute finding. Other: No soft tissue swelling or scalp hematoma. CT CERVICAL SPINE FINDINGS Alignment: Facet joints aligned without dislocation. Dens and lateral masses are aligned. Reversal of the cervical lordosis. Skull base and vertebrae: No acute fracture. No primary bone lesion or focal pathologic process. Soft tissues and spinal canal: No prevertebral fluid or swelling. No visible canal hematoma. Disc levels: Multilevel intervertebral disc height loss and degenerative endplate spurring, most pronounced at C5-6 and C6-7. Multilevel bilateral facet and uncovertebral arthropathy. Upper chest: Visualized lung apices are clear. Other: None. IMPRESSION: 1. No acute intracranial findings. 2. No acute fracture or subluxation of the cervical spine. 3. Multilevel degenerative disc and facet arthropathy of the cervical spine. Electronically Signed   By: Davina Poke D.O.   On: 04/02/2020 15:02    Procedures Procedures (including critical care time)  Medications Ordered in ED Medications  oxyCODONE-acetaminophen (PERCOCET/ROXICET) 5-325 MG per tablet 2 tablet (2 tablets Oral Given 04/02/20 1425)  ondansetron (ZOFRAN-ODT) disintegrating tablet 4 mg (4 mg Oral Given 04/02/20  1425)    ED Course  I have reviewed the triage vital signs and the nursing notes.  Pertinent labs & imaging results that were available during my care of the patient were reviewed by me and considered in my medical decision making (see chart for details).    MDM Rules/Calculators/A&P                          79 year old male here with complaint of left sided chest pain after recent fall.  His clinical  presentation is not consistent with ACS.  Opponent was ordered by triage nurse.  1 obtained and I do not feel that we needed to continue as ACS is not high on my differential.  Patient's EKG shows normal sinus rhythm at a rate of 72 without evidence of ischemic change.  I personally ordered and reviewed images of the chest (plain film and CT), head, and C-spine the patient had a traumatic injury on blood thinners.  There is no evidence of intracranial abnormality or skull fracture.  CT of the neck shows no acute fractures or subluxations.  There are no acute abnormal findings patient's chest x-ray.  CT of the chest shows no occult rib fractures.  The patient's left shoulder replacement appears intact on imaging.  Patient does not have any obvious signs of brachial plexopathy, rib fractures, clavicle fractures, lung contusions or other cause of pain.  He has a stable ascending aortic aneurysm and is scheduled for follow-up with Dr. Servando Snare this week.  His symptoms are not consistent with rupturing aneurysm or dissection.  Patient treated here with pain medications with some improvement.  Will discharge with incentive spirometer and close outpatient follow-up. I have reviewed the PDMP for this visit.  Patient appears approved for discharge at this time.  Discussed return precautions Final Clinical Impression(s) / ED Diagnoses Final diagnoses:  Chest wall pain    Rx / DC Orders ED Discharge Orders         Ordered    oxyCODONE (ROXICODONE) 5 MG immediate release tablet  Every 6 hours PRN        04/02/20 1619    lidocaine (LIDODERM) 5 %  Every 24 hours        04/02/20 1619           Margarita Mail, PA-C 04/02/20 1623    Veryl Speak, MD 04/03/20 [phone]

## 2020-04-02 NOTE — Discharge Instructions (Addendum)
Contact a health care provider if: You have a fever. Your chest pain becomes worse. You have new symptoms. Get help right away if: You have nausea or vomiting. You feel sweaty or light-headed. You have a cough with mucus from your lungs (sputum) or you cough up blood. You develop shortness of breath.

## 2020-04-02 NOTE — ED Triage Notes (Signed)
Pt saw pcp yesterday for fall.   Reports was sent here yesterday and had xray.  Reports no fractures were seen yesterday.  Reports still having pain with any movement in left chest.

## 2020-04-03 ENCOUNTER — Other Ambulatory Visit: Payer: Self-pay | Admitting: Cardiothoracic Surgery

## 2020-04-03 DIAGNOSIS — R911 Solitary pulmonary nodule: Secondary | ICD-10-CM

## 2020-04-03 NOTE — Progress Notes (Signed)
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 Signed   By: Davina Poke D.O.   On: 04/02/2020 15:02   CT Chest Wo Contrast  Result Date: 03/21/2020 CLINICAL DATA:  Thoracic aortic aneurysm follow-up. EXAM: CT CHEST WITHOUT CONTRAST TECHNIQUE: Multidetector CT imaging of the chest was performed following the standard protocol without IV contrast. COMPARISON:  March 12, 2019 FINDINGS: Cardiovascular: Ascending thoracic aorta dilated to 4.2 cm, similar to the prior study. Aortic caliber at the level of the aortic sinus approximately 4.6 cm also unchanged. Scattered atheromatous plaque with calcifications. Vascular structures not well assessed due to lack of intravenous contrast. Heart size is stable with signs of coronary artery disease as before. Central pulmonary vasculature is normal caliber. Mediastinum/Nodes: Thoracic inlet structures are normal. No axillary lymphadenopathy. No mediastinal lymphadenopathy. Esophagus mildly patulous. No gross hilar adenopathy. Lungs/Pleura: Signs of basilar atelectasis with patchy bilateral opacities in the chest. New ground-glass opacities are present worse on the RIGHT in the RIGHT middle and upper lobe, also in  the superior segment of the RIGHT lower lobe. In the RIGHT lower lobe there is a ground-glass nodule measuring 6 mm in the setting of generalized peribronchovascular ground-glass attenuation in this area. There is also suggestion of mild bronchiectasis in these areas as well that is new from the previous study. Process in the LEFT chest mainly atelectatic changes or scarring and with similar appearance to the prior study. No suspicious nodule. Airways are patent. Upper Abdomen: Incidental imaging of upper abdominal contents with post cholecystectomy changes. No acute upper abdominal process to the extent evaluated. Musculoskeletal: Spinal degenerative changes without acute or destructive bone process. LEFT shoulder arthroplasty. IMPRESSION: 1. Stable ascending thoracic aortic aneurysm. 2. New ground-glass opacities worse on the RIGHT in the RIGHT middle and upper lobe, also in the superior segment of the RIGHT lower lobe. Findings could be related to atypical infection, correlate with any recent symptoms. Consider short interval follow-up to ensure resolution as some of these areas show subtle nodular features. 3. Mild bronchiectatic changes are also demonstrated that are new compared to the previous exam these are present in areas affected by ground-glass in the RIGHT upper and RIGHT lower lobe. 4. Stable signs of coronary artery disease. 5. Aortic atherosclerosis. These results will be called to the ordering clinician or representative by the Radiologist Assistant, and communication documented in the PACS or [employer]. Aortic Atherosclerosis (ICD10-I70.0). Electronically Signed   By: Zetta Bills M.D.   On: 03/21/2020 13:56    Ct Abdomen Pelvis Wo Contrast  Result Date: 03/12/2019 CLINICAL DATA:  Abdominal pain with nausea and vomiting. Concern for small bowel obstruction. EXAM: CT CHEST, ABDOMEN AND PELVIS WITHOUT CONTRAST TECHNIQUE: Multidetector CT imaging of the chest, abdomen and pelvis was  performed following the standard protocol without IV contrast. COMPARISON:  06/03/2017. FINDINGS: CT CHEST FINDINGS Cardiovascular: Aortic atherosclerosis. Mild cardiomegaly, with LAD coronary artery atherosclerosis. Ascending aortic dilatation including at 4.2 cm on 32/2, similar. Tortuosity. Mediastinum/Nodes: No mediastinal or definite hilar adenopathy, given limitations of unenhanced CT. Lungs/Pleura: No pleural fluid. Left base subsegmental  atelectasis or scar. A nodule along the left major fissure is similar at 4 mm and can be presumed benign. There is also a perifissural 2 mm right upper lobe pulmonary nodule which is unchanged. Calcified left upper lobe granuloma. Musculoskeletal: Left shoulder arthroplasty. Lower thoracic spondylosis. CT ABDOMEN PELVIS FINDINGS Hepatobiliary: Caudate lobe enlargement is nonspecific. Old granulomatous disease within. Cholecystectomy, without biliary ductal dilatation. Pancreas: Fatty replacement involving the pancreatic head and less so body. Spleen: Normal in size, without focal abnormality. Adrenals/Urinary Tract: Normal adrenal glands. Mild bilateral renal cortical thinning. Low-density bilateral renal lesions are likely cysts. Other lesions are too small to characterize. Punctate right renal collecting system calculi. No hydronephrosis. A left pelvic calcification is positioned adjacent to the left ureter on 115/2. No bladder calculi. Stomach/Bowel: Stomach is contrast filled. The colon is normal to decompressed caliber. Right hemicolectomy. Proximal small bowel loops are fluid-filled and measure up to 4.0 cm. A "small bowel feces sign" is identified on 93/2 at the site of a transition to decompressed bowel. No mass in this area. No complicating ischemia. Vascular/Lymphatic: Aortic and branch vessel atherosclerosis. No abdominopelvic adenopathy. Reproductive: Normal prostate. Other: No significant free fluid. No free intraperitoneal air. No evidence of omental or  peritoneal disease. Musculoskeletal: Mild osteopenia. Presumed bone islands within the pelvis. IMPRESSION: 1. Small bowel obstruction within the mid ileum, likely due to underlying adhesions. No complicating ischemia. 2. Status post right hemicolectomy. No noncontrast CT findings of metastatic disease in the abdomen or pelvis. 3.  No acute process in the chest. 4. Coronary artery atherosclerosis. Aortic Atherosclerosis (ICD10-I70.0). 5. Right nephrolithiasis. 6. Similar mild ascending aortic dilatation. Recommend annual imaging followup by CTA or MRA. This recommendation follows 2010 ACCF/AHA/AATS/ACR/ASA/SCA/SCAI/SIR/STS/SVM Guidelines for the Diagnosis and Management of Patients with Thoracic Aortic Disease. Circulation. 2010; 121: S923-R007. Aortic aneurysm NOS (ICD10-I71.9). Electronically Signed   By: Abigail Miyamoto M.D.   On: 03/12/2019 15:59   Ct Chest Wo Contrast  Result Date: 03/12/2019 CLINICAL DATA:  Abdominal pain with nausea and vomiting. Concern for small bowel obstruction. EXAM: CT CHEST, ABDOMEN AND PELVIS WITHOUT CONTRAST TECHNIQUE: Multidetector CT imaging of the chest, abdomen and pelvis was performed following the standard protocol without IV contrast. COMPARISON:  06/03/2017. FINDINGS: CT CHEST FINDINGS Cardiovascular: Aortic atherosclerosis. Mild cardiomegaly, with LAD coronary artery atherosclerosis. Ascending aortic dilatation including at 4.2 cm on 32/2, similar. Tortuosity. Mediastinum/Nodes: No mediastinal or definite hilar adenopathy, given limitations of unenhanced CT. Lungs/Pleura: No pleural fluid. Left base subsegmental atelectasis or scar. A nodule along the left major fissure is similar at 4 mm and can be presumed benign. There is also a perifissural 2 mm right upper lobe pulmonary nodule which is unchanged. Calcified left upper lobe granuloma. Musculoskeletal: Left shoulder arthroplasty. Lower thoracic spondylosis. CT ABDOMEN PELVIS FINDINGS Hepatobiliary: Caudate lobe enlargement  is nonspecific. Old granulomatous disease within. Cholecystectomy, without biliary ductal dilatation. Pancreas: Fatty replacement involving the pancreatic head and less so body. Spleen: Normal in size, without focal abnormality. Adrenals/Urinary Tract: Normal adrenal glands. Mild bilateral renal cortical thinning. Low-density bilateral renal lesions are likely cysts. Other lesions are too small to characterize. Punctate right renal collecting system calculi. No hydronephrosis. A left pelvic calcification is positioned adjacent to the left ureter on 115/2. No bladder calculi. Stomach/Bowel: Stomach is contrast filled. The colon is normal to decompressed caliber. Right hemicolectomy. Proximal small bowel loops are fluid-filled and measure up to 4.0 cm. A "small bowel feces sign" is identified on 93/2 at the site of a transition to  decompressed bowel. No mass in this area. No complicating ischemia. Vascular/Lymphatic: Aortic and branch vessel atherosclerosis. No abdominopelvic adenopathy. Reproductive: Normal prostate. Other: No significant free fluid. No free intraperitoneal air. No evidence of omental or peritoneal disease. Musculoskeletal: Mild osteopenia. Presumed bone islands within the pelvis. IMPRESSION: 1. Small bowel obstruction within the mid ileum, likely due to underlying adhesions. No complicating ischemia. 2. Status post right hemicolectomy. No noncontrast CT findings of metastatic disease in the abdomen or pelvis. 3.  No acute process in the chest. 4. Coronary artery atherosclerosis. Aortic Atherosclerosis (ICD10-I70.0). 5. Right nephrolithiasis. 6. Similar mild ascending aortic dilatation. Recommend annual imaging followup by CTA or MRA. This recommendation follows 2010 ACCF/AHA/AATS/ACR/ASA/SCA/SCAI/SIR/STS/SVM Guidelines for the Diagnosis and Management of Patients with Thoracic Aortic Disease. Circulation. 2010; 121: C623-J628. Aortic aneurysm NOS (ICD10-I71.9). Electronically Signed   By: Abigail Miyamoto M.D.   On: 03/12/2019 15:59    CLINICAL DATA:  79 year old male with a history of thoracic aneurysm  EXAM: MRA CHEST WITH OR WITHOUT CONTRAST  TECHNIQUE: Angiographic images of the chest were obtained using MRA technique without intravenous contrast.  CONTRAST:  None  COMPARISON:  06/03/2017, 01/28/2017, MR 10/29/2016  FINDINGS: VASCULAR  Aorta: Thoracic aorta unchanged configuration compared to the prior studies. The greatest diameter of ascending aorta again measures approximately 4.4 cm. No dissection flap. No significant calcified atherosclerotic changes.  Heart: Heart size unchanged.  No pericardial fluid/thickening.  Pulmonary Arteries: Main pulmonary artery is not enlarged. Flow signal maintained within the proximal pulmonary arteries.  Other: No dissection flap of the descending thoracic aorta. No. Aortic fluid.  NON-VASCULAR  Chest wall unremarkable.  No mediastinal adenopathy.  Lungs grossly unremarkable.  Unremarkable appearance of the visualized musculoskeletal elements.  Unremarkable appearance of the upper abdomen.  IMPRESSION: Unchanged appearance of the thoracic aorta, with the greatest diameter of the ascending aorta measuring approximately 4.4 cm.  Signed,  Dulcy Fanny. Dellia Nims, RPVI  [facility]  [facility]   Electronically Signed   By: Corrie Mckusick D.O.   On: 02/02/2018 13:22  Mr Angiogram Chest W Wo Contrast  Result Date: 10/29/2016 CLINICAL DATA:  Aortic aneurysm EXAM: MRA CHEST WITH CONTRAST TECHNIQUE: Multiplanar, multiecho pulse sequences of the chest were obtained with intravenous contrast. Angiographic images of chest were obtained using MRA technique with intravenous contrast. CONTRAST:  9 cc MultiHance COMPARISON:  03/11/2016 FINDINGS: Maximal diameter of the ascending aorta at the sinus of all saw above, sino-tubular junction, and ascending aorta  are 5.3 cm, 4.4 cm, and 4.4 cm, respectively. This compares with 5.3 cm, 4.4 cm, and 4.3 cm on the prior study respectively. These measurements are not significantly changed. There is no evidence of intramural hematoma or dissection. Great vessels are patent within the confines of the exam. Vertebral arteries are also grossly patent. No obvious pulmonary thromboembolism. No obvious mediastinal mass effect. There is subsegmental atelectasis towards the lung bases. Visualized abdominal organs are unremarkable. IMPRESSION: Stable aneurysmal dilatation of the ascending aorta at 4.4 cm. Recommend annual imaging followup by CTA or MRA. This recommendation follows 2010 ACCF/AHA/AATS/ACR/ASA/SCA/SCAI/SIR/STS/SVM Guidelines for the Diagnosis and Management of Patients with Thoracic Aortic Disease. Circulation. 2010; 121: B151-V616 Electronically Signed   By: Marybelle Killings M.D.   On: 10/29/2016 16:58    Ct Abdomen Pelvis W Contrast  Result Date: 10/15/2016 CLINICAL DATA:  79 year old male with fever. An status post laparoscopic resection of the transverse colon on 08/14/2016. Burning sensation at the ostomy site. EXAM: CT ABDOMEN AND  PELVIS WITH CONTRAST TECHNIQUE: Multidetector CT imaging of the abdomen and pelvis was performed using the standard protocol following bolus administration of intravenous contrast. CONTRAST:  56m ISOVUE-300 IOPAMIDOL (ISOVUE-300) INJECTION 61% COMPARISON:  Abdominal CT dated 09/03/2016 FINDINGS: Lower chest: Bibasilar linear atelectasis/ scarring. The visualized lung bases are otherwise clear. Partially visualized central venous line in the right atrium. No intra-abdominal free air. There is mild diffuse mesenteric and omental edema, improved compared to the prior CT. No free fluid. Hepatobiliary: Cholecystectomy. Mild intrahepatic biliary ductal dilatation versus mild periportal edema. The liver is unremarkable. Pancreas: Unremarkable. No pancreatic ductal dilatation or surrounding  inflammatory changes. Spleen: Splenomegaly measuring up to 17 cm in length similar to prior exam. Adrenals/Urinary Tract: The adrenal glands are unremarkable. Bilateral renal cortical atrophy and irregularity. Small bilateral nonobstructing renal calculi measure up to 4 mm in the interpolar aspect of the right kidney. There is no hydronephrosis on either side. Bilateral renal hypodense lesions measure up to 19 mm along the medial aspect of the inferior pole of the right kidney. The larger lesions represent cysts and the smaller lesions are too small to characterize. There is no hydronephrosis on either side. There is symmetric uptake and excretion of contrast by kidneys bilaterally. The visualized ureters and urinary bladder appear unremarkable. Stomach/Bowel: There is postsurgical changes of right hemicolectomy with a right lower quadrant ileostomy. Oral contrast opacifies the stomach and multiple loops of small bowel and traversing into the ileostomy. There is no evidence of bowel obstruction. No active inflammatory changes of the small bowel. Contrast load distal colon and rectum presumably from prior study or related to rectal administration. Mild thickened appearance of the colonic mucosa similar to prior CT. There is diffuse omental edema with overall interval improvement compared to the prior CT. No drainable fluid collection or abscess identified. Vascular/Lymphatic: There is moderate aortoiliac atherosclerotic disease. There is no aneurysmal dilatation or evidence of dissection. The origins of the mesenteric vasculature appear patent. The SMV, splenic vein, and main portal vein are patent. No portal venous gas identified. There is no adenopathy. Reproductive: The prostate and seminal vesicles appear unremarkable. Other: Postoperative changes of the anterior abdominal wall with a midline vertical anterior abdominal wall incisional scar. Abutment of loops of small bowel to the anterior peritoneal wall in the  midline may represent adhesions. Musculoskeletal: Mild degenerative changes of the spine. No acute fracture. L2 hemangioma. IMPRESSION: 1. Postoperative changes of right hemicolectomy. Interval resolution of the previously seen pneumoperitoneum and ascites. Residual inflammatory changes of the omental with interval improvement compared to prior study. No drainable fluid collection or abscess. 2. No evidence of bowel obstruction. Contrast traverses into the ostomy. 3. Abutment of loops of small bowel to the anterior peritoneal wall in the midline likely representing a degree of adhesion. 4. Splenomegaly. 5. Mild-to-moderate renal parenchyma atrophy and cortical irregularity. Small nonobstructing bilateral renal calculi as well as probable bilateral small renal cysts. No hydronephrosis. Electronically Signed   By: AAnner CreteM.D.   On: 10/15/2016 02:43   Mr Angiogram Chest W Wo Contrast  Result Date: 03/11/2016 CLINICAL DATA:  79year old male with a history of ascending thoracic aortic aneurysm. EXAM: MRA CHEST WITH OR W  ITHOUT CONTRAST TECHNIQUE: Angiographic images of the chest were obtained using MRA technique without and with intravenous contrast. CONTRAST:  152mMULTIHANCE GADOBENATE DIMEGLUMINE 529 MG/ML IV SOLN COMPARISON:  Most recent prior MRA chest 09/27/2015 FINDINGS: VASCULAR Unchanged dilatation of the aortic root measuring up to 5.3 cm at the sinuses  of Valsalva (best measured on the sagittally reformatted images). This is unchanged compared to my measurement on the prior study. Similarly, the aortic root measures approximately 5.1 cm on the axial images although this is likely an underestimation. Aneurysmal dilatation of the tubular portion of the ascending thoracic aorta is also insignificantly changed at 4.3 cm. No effacement of the sino-tubular junction. No evidence of arch or descending aortic dilatation or aneurysm. No dissection. Conventional 3 vessel arch anatomy. The heart is normal  in size. No pericardial effusion. The main and central pulmonary arteries are normal in size. No central pulmonary embolus. NON VASCULAR No focal signal abnormality or abnormal enhancement in the mediastinum, lungs, visualized upper abdomen or musculoskeletal structures. There is metallic artifact in the region of the left glenohumeral joint secondary to a left shoulder arthroplasty prosthesis. IMPRESSION: 1. Stable dilatation of the aortic root measuring up to 5.3 cm at the sinuses of Valsalva (best measured on the sagittally reformatted images). On the axial images, the aortic root measures 5.1 cm which is also unchanged compared to prior imaging. 2. Stable fusiform aneurysmal dilatation of the tubular portion of the ascending thoracic aorta at 4.3 cm. Signed, Criselda Peaches, MD [facility] Electronically Signed   By: Jacqulynn Cadet M.D.   On: 03/11/2016 10:53   Mr Angiogram Chest W Wo Contrast  09/27/2015  CLINICAL DATA:  F/U for ascending aortic aneurysm. No symtoms. Hx of skin cancer. EXAM: MRA CHEST WITH OR WITHOUT CONTRAST TECHNIQUE: Angiographic images of the chest were obtained using MRA technique without and with intravenous contrast. CONTRAST:  41m MULTIHANCE GADOBENATE DIMEGLUMINE 529 MG/ML IV SOLN (Half dose OK'd per Dr. KMaryland Pinkdue to GFR of 37). COMPARISON:  None. FINDINGS: Thoracic aorta shows no significant atheromatous irregularity, dissection, or stenosis. Classic 3 vessel brachiocephalic arterial origin anatomy without proximal stenosis. Maximum transverse diameters as follows: 5.1 cm sinuses of Valsalva (previously 5.1 cm) 4.1 cm sino-tubular junction 4.1 cm mid ascending 3.9 cm distal ascending/ proximal arch 3.4 cm distal arch 3.5 cm proximal descending 3 x 2.6 cm distal descending above the diaphragm Central pulmonary arteries unremarkable. No axillary, anterior endplate spurring in the mid thoracic spine. Limited  visualized portions of the upper abdomen grossly unremarkable. Mediastinal or hilar adenopathy. No pleural or pericardial effusion. IMPRESSION: 1. Stable aortic root dilatation, 5.1 cm sinuses of Valsalva, without complicating features. Electronically Signed   By: DLucrezia EuropeM.D.   On: 09/27/2015 13:57   Mr Angiogram Chest W Wo Contrast  03/22/2014   CLINICAL DATA:  SIZE OF THORACIC AORTA (THORACIC ANEURYSM)  EXAM: MRA CHEST WITH OR WITHOUT CONTRAST  TECHNIQUE: Angiographic images of the chest were obtained using MRA technique without and with intravenous contrast.  [place] [place]  [address]  Results:  BUN 12 mg/dL,  Creatinine 1.6 mg/dL.  CONTRAST:  972mMULTIHANCE GADOBENATE DIMEGLUMINE 529 MG/ML IV SOLN  COMPARISON:  08/28/2013 and earlier studies  FINDINGS: The thoracic aorta is dilated to a diameter of 5.1 cm at the level of the sinuses of Valsalva (stable by my measurement), 4.4 cm at the sino-tubular junction, 4.2 cm distal ascending/ proximal arch, 3.5 cm distal arch/proximal descending, 2.7 cm distal descending above the diaphragm. No evidence of dissection or stenosis. Classic 3 vessel brachiocephalic arterial origin anatomy without proximal stenosis. No significant atheromatous irregularity. No pleural or pericardial effusion. No hilar or mediastinal adenopathy. Visualized portions of upper abdomen  and proximal abdominal aorta unremarkable. Usual degenerative spurring at multiple contiguous levels in the mid thoracic spine.  IMPRESSION: 1. Stable 5.1 cm ascending aortic aneurysm without complicating features.   Electronically Signed   By: Arne Cleveland M.D.   On: 03/22/2014 10:51    Mr Angiogram Chest W Wo Contrast  08/28/2013   CLINICAL DATA:  Aortic aneurysm  EXAM: MRA CHEST WITH OR WITHOUT CONTRAST  TECHNIQUE: Angiographic images of the chest were obtained using MRA technique without and with intravenous contrast. Gated sagittal  oblique images through the aortic arch were obtained.  CONTRAST:  53m MULTIHANCE GADOBENATE DIMEGLUMINE 529 MG/ML IV SOLN  COMPARISON:  09/05/2012 and earlier studies  FINDINGS: The thoracic aorta is dilated to a diameter of 5.1 cm at the level the sinuses of Valsalva (stable by my measurement), 4.5 cm at the sino-tubular junction, 4.2 cm mid ascending, 3.6 cm proximal arch, 3.2 cm distal arch, 3.0 cm proximal descending, 2.6 cm distal descending above the diaphragm. No evidence of dissection or stenosis. Classic 3 vessel brachiocephalic arterial origin anatomy without proximal stenosis. No significant atheromatous irregularity.  No pleural or pericardial effusion. No hilar or mediastinal adenopathy. Visualized portions of upper abdomen and proximal abdominal aorta unremarkable. Usual endplate spurring at multiple contiguous levels in the mid thoracic spine.  IMPRESSION: 1. Stable ascending aortic aneurysm without complicating features.   Electronically Signed   By: DArne ClevelandM.D.   On: 08/28/2013 12:29      Recent Lab Findings: Lab Results  Component Value Date   WBC 6.5 12/25/2019   HGB 13.7 12/25/2019   HCT 41.9 12/25/2019   PLT 155 12/25/2019   GLUCOSE 93 12/25/2019   CHOL 105 12/25/2019   TRIG 305 (H) 12/25/2019   HDL 38 (L) 12/25/2019   LDLDIRECT 28.0 04/19/2015   LDLCALC 23 12/25/2019   ALT 23 12/25/2019   AST 19 12/25/2019   NA 142 12/25/2019   K 4.9 12/25/2019   CL 105 12/25/2019   CREATININE 1.79 (H) 12/25/2019   BUN 15 12/25/2019   CO2 23 12/25/2019   TSH 3.870 12/25/2019   INR 1.04 06/02/2017   HGBA1C 5.0 07/22/2017   ECHOCARDIOGRAM REPORT     Patient Name:  WATLAS CROSSLANDDate of Exam: 09/06/2019  Medical Rec #: [id number]    Height:    70.0 in  Accession #:  [id number]    Weight:    198.0 lb  Date of Birth: [birth year]    BSA:     2.08 m  Patient Age:  89years      BP:      124/70 mmHg  Patient Gender: M          HR:      64 bpm.  [facility]  Procedure: 2D Echo, 3D Echo, Cardiac Doppler, Color Doppler and Strain  Analysis   Indications:  I71.2 Ascending aortic aneurysm.    History:    Patient has prior history of Echocardiogram examinations,  most         recent 11/25/2016. Cardiomyopathy, CAD, Ascending aortic         aneurysm, Arrythmias:Atrial Fibrillation and Bradycardia;  Risk         Factors:Hypertension and Former Smoker. CKD stage 3.  Pulmonary         embolus.    Sonographer:  KJessee Avers RDCS  Referring Phys: [phone] [email or web address]  IMPRESSIONS    1. Left ventricular ejection  fraction, by visual estimation, is 60 to  65%. The left ventricle has normal function. There is no left ventricular  hypertrophy.  2. Abnormal septal motion consistent with left bundle branch block.  3. The left ventricle has no regional wall motion abnormalities.  4. Global right ventricle has normal systolic function.The right  ventricular size is normal. No increase in right ventricular wall  thickness.  5. Left atrial size was mildly dilated.  6. Right atrial size was normal.  7. The mitral valve is normal in structure. No evidence of mitral valve  regurgitation. No evidence of mitral stenosis.  8. The tricuspid valve is normal in structure.  9. The tricuspid valve is normal in structure. Tricuspid valve  regurgitation is not demonstrated.  10. The aortic valve is tricuspid. Aortic valve regurgitation is mild. No  evidence of aortic valve sclerosis or stenosis.  11. The pulmonic valve was normal in structure. Pulmonic valve  regurgitation is not visualized.  12. 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  13. The inferior vena cava is normal in size with greater than 50%  respiratory variability, suggesting right atrial pressure of 3 mmHg.  14. Aortic root 28m. Consider CTA of aorta for  correlation of  measurement.  15. The average left ventricular global longitudinal strain is -18.8 %.   In comparison to the previous echocardiogram(s): 11/25/16 EF 55-60%. Aortic  root dimension 451m  FINDINGS  Left Ventricle: Left ventricular ejection fraction, by visual estimation,  is 60 to 65%. The left ventricle has normal function. The average left  ventricular global longitudinal strain is -18.8 %. The left ventricle has  no regional wall motion  abnormalities. There is no left ventricular hypertrophy. Abnormal  (paradoxical) septal motion, consistent with left bundle branch block.  Normal left atrial pressure.   Right Ventricle: The right ventricular size is normal. No increase in  right ventricular wall thickness. Global RV systolic function is has  normal systolic function.   Left Atrium: Left atrial size was mildly dilated.   Right Atrium: Right atrial size was normal in size   Pericardium: There is no evidence of pericardial effusion.   Mitral Valve: The mitral valve is normal in structure. No evidence of  mitral valve regurgitation. No evidence of mitral valve stenosis by  observation.   Tricuspid Valve: The tricuspid valve is normal in structure. Tricuspid  valve regurgitation is not demonstrated.   Aortic Valve: The aortic valve is tricuspid. . There is mild thickening  and mild calcification of the aortic valve. Aortic valve regurgitation is  mild. Aortic regurgitation PHT measures 597 msec. The aortic valve is  structurally normal, with no evidence  of sclerosis or stenosis. There is mild thickening of the aortic valve.  There is mild calcification of the aortic valve.   Pulmonic Valve: The pulmonic valve was normal in structure. Pulmonic valve  regurgitation is not visualized. Pulmonic regurgitation is not visualized.   Aorta: The aortic root, ascending aorta and aortic arch are all  structurally normal, with no evidence of dilitation or obstruction.  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 The aneurysm  measures 50 mm. Ascending aorta distal to the  sinotubular junction is 4431m  Venous: The inferior vena cava is normal in size with greater than 50%  respiratory variability, suggesting right atrial pressure of 3 mmHg.   IAS/Shunts: No atrial level shunt detected by color flow Doppler. There is  no evidence of a patent  foramen ovale. No ventricular septal defect is  seen or detected. There is no evidence of an atrial septal defect.     LEFT VENTRICLE  PLAX 2D  LVIDd:     4.60 cm Diastology  LVIDs:     3.10 cm LV e' lateral:  7.35 cm/s  LV PW:     1.10 cm LV E/e' lateral: 7.1  LV IVS:    1.30 cm LV e' medial:  3.30 cm/s  LVOT diam:   2.40 cm LV E/e' medial: 15.8  LV SV:     59 ml  LV SV Index:  27.96  2D Longitudinal Strain  LVOT Area:   4.52 cm 2D Strain GLS (A2C):  -17.4 %             2D Strain GLS (A3C):  -19.4 %             2D Strain GLS (A4C):  -19.6 %             2D Strain GLS Avg:   -18.8 %               3D Volume EF:             3D EF:    50 %             LV EDV:    191 ml             LV ESV:    96 ml             LV SV:    95 ml   RIGHT VENTRICLE  RV Basal diam: 4.50 cm  RV Mid diam:  3.70 cm  RV S prime:   10.20 cm/s  TAPSE (M-mode): 1.8 cm   LEFT ATRIUM       Index    RIGHT ATRIUM      Index  LA diam:    2.90 cm 1.40 cm/m RA Pressure: 3.00 mmHg  LA Vol (A2C):  93.6 ml 45.03 ml/m RA Area:   20.90 cm  LA Vol (A4C):  63.3 ml 30.46 ml/m RA Volume:  64.10 ml 30.84 ml/m  LA Biplane Vol: 80.0 ml 38.49 ml/m  AORTIC VALVE  LVOT Vmax:  110.00 cm/s  LVOT Vmean: 68.700 cm/s  LVOT VTI:  0.277 m  AI PHT:   597 msec    AORTA  Ao Root diam: 5.00 cm  Ao Asc diam: 4.40 cm   MITRAL VALVE             TRICUSPID VALVE                   Estimated RAP: 3.00 mmHg    MV Decel Time: 327 msec       SHUNTS  MV E velocity: 52.30 cm/s 103 cm/s Systemic VTI: 0.28 m  MV A velocity: 64.60 cm/s 70.3 cm/s Systemic Diam: 2.40 cm  MV E/A ratio: 0.81    1.5     Candee Furbish MD  Electronically signed by Candee Furbish MD  Signature Date/Time: 09/06/2019/11:55:40 AM         Chronic Kidney Disease   Stage I     GFR >90  Stage II    GFR 60-89  Stage IIIA GFR 45-59  Stage IIIB GFR 30-44  Stage IV   GFR 15-29  Stage V    GFR  <15  Lab Results  Component Value Date   CREATININE 1.79 (H) 12/25/2019   CrCl  cannot be calculated (Patient's most recent lab result is older than the maximum 21 days allowed.). Assessment / Plan:  #1 PAF now on amiodarone and anticoagulation with Eliquis due to atrial fibrillation -followed by cardiology  #2 chronic kidney disease  #3 mild aortic regurgitation  #4 ascending thoracic aortic aneurysm 4.8 cm at the aortic root as measured by echo, ascending aorta noncontrasted CT measures at 4.4 cm.  #5  New ground-glass opacities worse on the RIGHT in the RIGHT middle and upper lobe, also in the superior segment of the RIGHT lower lobe.-   Follow-up chest x-ray done today 2 weeks after his previous CT of the chest is clear of any acute changes  Plan see the patient back with a follow-up CT of the chest no contrast in 8 months to evaluate the size of his aorta-without contrast due to his known elevated creatinine    Grace Isaac MD      [address] [place] [address] Office [phone]   Beeper [phone]

## 2020-04-04 ENCOUNTER — Other Ambulatory Visit: Payer: Self-pay

## 2020-04-04 ENCOUNTER — Ambulatory Visit
Admission: RE | Admit: 2020-04-04 | Discharge: 2020-04-04 | Disposition: A | Discharge status: Home / Self Care | Payer: Medicare Other | Source: Ambulatory Visit | Attending: Cardiothoracic Surgery | Admitting: Cardiothoracic Surgery

## 2020-04-04 ENCOUNTER — Ambulatory Visit (INDEPENDENT_AMBULATORY_CARE_PROVIDER_SITE_OTHER): Payer: Medicare Other | Admitting: Cardiothoracic Surgery

## 2020-04-04 VITALS — BP 136/74 | HR 69 | Temp 97.6°F | Resp 20 | Ht 72.0 in | Wt 210.0 lb

## 2020-04-04 DIAGNOSIS — I700 Atherosclerosis of aorta: Secondary | ICD-10-CM | POA: Diagnosis not present

## 2020-04-04 DIAGNOSIS — R911 Solitary pulmonary nodule: Secondary | ICD-10-CM

## 2020-04-04 DIAGNOSIS — I712 Thoracic aortic aneurysm, without rupture: Secondary | ICD-10-CM

## 2020-04-04 DIAGNOSIS — J9811 Atelectasis: Secondary | ICD-10-CM | POA: Diagnosis not present

## 2020-04-04 DIAGNOSIS — I7121 Aneurysm of the ascending aorta, without rupture: Secondary | ICD-10-CM

## 2020-04-04 MED ORDER — OXYCODONE HCL 5 MG PO TABS: 2.5000 mg | ORAL_TABLET | Freq: Four times a day (QID) | ORAL | 0 refills | Status: DC | PRN

## 2020-04-11 DIAGNOSIS — G894 Chronic pain syndrome: Secondary | ICD-10-CM | POA: Diagnosis not present

## 2020-04-11 DIAGNOSIS — Z6827 Body mass index (BMI) 27.0-27.9, adult: Secondary | ICD-10-CM | POA: Diagnosis not present

## 2020-04-11 DIAGNOSIS — E663 Overweight: Secondary | ICD-10-CM | POA: Diagnosis not present

## 2020-05-05 NOTE — Progress Notes (Signed)
CARDIOLOGY OFFICE NOTE  Date:  05/15/2020    [name] Date of Birth: 1941-02-24 Medical Record #[id number]  PCP:  Sharilyn Sites, MD  Cardiologist:  Marisa Cyphers   Chief Complaint  Patient presents with  . Follow-up    Seen for Dr. Marlou Porch    History of Present Illness: Derek [name] is a 79 y.o. male who presents today for a 6 month check. Seen for Dr. Marlou Porch - has also seen Dr. Saunders Revel in the past as well.   He has a history of PAF, NICM, aortic root aneurysm followed by EBG, Crohn's disease with prior colectomy, colon cancer, remote PE and CKD - followed by Renal.   Had a telehealth visit with Dr. Marlou Porch in May of 2020 - felt to be doing ok. Echo was updated in February of 2021. I saw him this past May - he was upset about our no visitor policy at that time - wife does his medicines, he did not know what he was taking. Had had some rib fractures with no fall noted.   Comes in today. Here alone. He is doing ok. He feels good. Saw EBG last month - had had CT and had ground glass opacities on the right - but follow up CXR was ok. Unclear what this was - he was not having any symptoms. He has had his 3rd COVID vaccine. His aneurysm is a bit bigger - for repeat scan next spring. His BP is good. He is not dizzy or lightheaded. Has had a fall back at the end of August - got up during the night and did not turn the light on - fortunately, did not get hurt.  He did go to the ER due to the pain - hit his left chest - this is now resolved. No further chest pain. Breathing is ok. He notes that about a week or so prior to his next B12 injection he feels weaker - this improves after the shot. Overall, he feels like he is ok. He just had labs with Dr. Jimmy Footman.   Past Medical History:  Diagnosis Date  . Allergy   . Anemia   . Anxiety   . Aortic insufficiency    a. mild-mod by echo 09/2015.  . Arthritis    "knees; left shoulder" (09/21/2013)  . Ascending aortic aneurysm  (HCC)    a. last measurement 5.3 cm 03/2016 -> f/u planned 09/2015 to continue to follow.  . Atrial fibrillation ([place])   . B12 deficiency    takes Vit 12 shot every 14days   . Bowel perforation ([place]) 08/27/2016  . Cataract   . CKD (chronic kidney disease) stage 3, GFR 30-59 ml/min (HCC) 08/22/2011  . Clotting disorder ([place])   . Colon cancer ([place])   . Colonic ischemia ([place]) 08/27/2016  . Crohn's disease ([place])   . Depression   . Enlarged prostate   . Enteric hyperoxaluria 02/21/2016  . GERD (gastroesophageal reflux disease)    takes Omeprazole daily  . Gout    takes Uloric and Colchicine daily  . Heart murmur   . Hepatitis C 1978   negtive RNA load - spontaneously cleared  . Hiatal hernia   . High output ileostomy ([place]) 06/02/2017  . History of blood transfusion 1978; 1990's; ?   "w/bowel resection; S/P allupurinol; ?" (09/21/2013)  . History of colon polyps   . History of kidney stones   . History of MRSA infection 2010  . History of  pulmonary embolism 2006   both legs and both lungs /notes 08/26/2008 (09/21/2013)  . History of small bowel obstruction   . History of staph infection 1978  . Hyperoxaluria    Intestinal  . Hypertension   . Insomnia    takes Trazodone nightly  . Internal hemorrhoids   . LV dysfunction    a. h/o EF 45-50% in 2015, normalized on subsequent echoes.  . Nephrolithiasis   . Nocardia infection   . Pancreatitis 2010   elevated lipase and amylase, stranding in tail of pancreas, ? from Humira  . Pancytopenia    Hx of  . Peripheral neuropathy    takes Gabapentin daily  . Pneumonia    hx of   . Post-traumatic stress syndrome    takes Paxil nightly  . PTSD (post-traumatic stress disorder)   . Pulmonary nodule    a. 71m by CT 05/2015, recommended f/u 6-12 months.  . RLS (restless legs syndrome)   . Rosacea conjunctivitis(372.31)    takes Minocin daily  . Secondary hyperparathyroidism ([place] 02/21/2016  . Sinus bradycardia   . Skin cancer     "cut/burned off left ear and face" (09/21/2013)  . Small bowel obstruction ([place]   . Status post reversal of ileostomy 07/23/2017  . Thrombocytopenia ([place]    hx of    Past Surgical History:  Procedure Laterality Date  . ANKLE SURGERY Right   . APPENDECTOMY  1978  . BOWEL RESECTION  1978 X 2  . CARDIOVERSION N/A 05/29/2016   Procedure: CARDIOVERSION;  Surgeon: MSanda Klein MD;  Location: [facility];  Service: Cardiovascular;  Laterality: N/A;  . CHOLECYSTECTOMY    . Colon Cancer    . COLON RESECTION N/A 08/14/2016   Procedure: LAPAROSCOPIC RESECTION TRANSVERSE COLON;  Surgeon: DAlphonsa Overall MD;  Location: [facility];  Service: General;  Laterality: N/A;  . COLON SURGERY    . COLONOSCOPY    . ESOPHAGOGASTRODUODENOSCOPY    . EYE SURGERY     cataract surgery bilateral  . FOOT SURGERY Right    "took gout out"  . HEMICOLECTOMY Right   . ILEOCECETOMY  1978   /[name]10/03/2000  (09/21/2013)  . ILEOSTOMY    . ILEOSTOMY CLOSURE N/A 07/23/2017   Procedure: ILEOSTOMY REVERSAL ;  Surgeon: NAlphonsa Overall MD;  Location: [facility];  Service: General;  Laterality: N/A;  . INGUINAL HERNIA REPAIR Right   . IR FLUORO GUIDE CV LINE RIGHT  05/07/2017  . IR REMOVAL TUN CV CATH W/O FL  10/08/2017  . IR [place] VASC ACCESS RIGHT  05/07/2017  . KNEE ARTHROSCOPY Left   . LAPAROTOMY N/A 08/27/2016   Procedure: EXPLORATORYLAPAROTOMY, LYSIS OF ADHESIONS, ILEOSTOMY, RIGHT COLECTOMY;  Surgeon: DAlphonsa Overall MD;  Location: [facility];  Service: General;  Laterality: N/A;  . LIGAMENT REPAIR Left   . POLYPECTOMY    . TEE WITHOUT CARDIOVERSION N/A 05/29/2016   Procedure: TRANSESOPHAGEAL ECHOCARDIOGRAM (TEE);  Surgeon: MSanda Klein MD;  [place] [facility]  Service: Cardiovascular;  Laterality: N/A;  . TOTAL SHOULDER ARTHROPLASTY Left 09/21/2013  . TOTAL SHOULDER ARTHROPLASTY Left 09/21/2013   Procedure: LEFT TOTAL SHOULDER ARTHROPLASTY;  Surgeon: KMarin Shutter MD;  [facility]  Service: Orthopedics;   Laterality: Left;     Medications: Current Meds  Medication Sig  . Acidophilus Lactobacillus CAPS Take 1 capsule by mouth daily.  .Marland Kitchenamiodarone (PACERONE) 200 MG tablet Take 1 tablet (200 mg total) by mouth daily.  . calcium carbonate (TUMS - DOSED IN MG  ELEMENTAL CALCIUM) 500 MG chewable tablet Chew 2 tablets by mouth 2 (two) times daily.  . Cholecalciferol (VITAMIN D) 2000 units CAPS Take 2,000 Units by mouth daily.  . cyanocobalamin (,VITAMIN B-12,) 1000 MCG/ML injection Inject 1,000 mcg into the muscle. THREE TIMES A MONTH  . ELIQUIS 5 MG TABS tablet Take 1 tablet (5 mg total) by mouth 2 (two) times daily.  . famotidine (PEPCID) 20 MG tablet Take 20 mg by mouth daily.  . febuxostat (ULORIC) 40 MG tablet Take 40 mg by mouth daily.  . ferrous sulfate 325 (65 FE) MG tablet Take 325 mg by mouth daily with breakfast.  . gabapentin (NEURONTIN) 100 MG capsule Take one (1) capsules (100 mg) by mouth each morning and 350m at bedtime.  .Marland KitchenHYDROcodone-acetaminophen (NORCO) 10-325 MG tablet Take 1-2 tablets by mouth 4 (four) times daily as needed.  .Marland Kitchenlevothyroxine (SYNTHROID) 50 MCG tablet Take 50 mcg by mouth daily before breakfast.  . lidocaine (LIDODERM) 5 % Place 1 patch onto the skin daily. Remove & Discard patch within 12 hours or as directed by MD  . magnesium oxide (MAGNESIUM-OXIDE) 400 (241.3 Mg) MG tablet Take 1,200 mg by mouth 2 (two) times daily.   . Multiple Vitamin (MULTIVITAMIN WITH MINERALS) TABS tablet Take 1 tablet by mouth daily.  .Marland KitchenoxyCODONE (ROXICODONE) 5 MG immediate release tablet Take 0.5-1 tablets (2.5-5 mg total) by mouth every 6 (six) hours as needed for severe pain.  .Marland KitchenPARoxetine (PAXIL) 20 MG tablet Take 20 mg by mouth daily.   . sodium bicarbonate 650 MG tablet Take 1,300 mg by mouth 2 (two) times daily.   . traZODone (DESYREL) 100 MG tablet Take 100 mg by mouth at bedtime.     Allergies: Allergies  Allergen Reactions  . Lorazepam Other (See Comments)     Reaction:  Hallucinations   . Humira [Adalimumab] Other (See Comments)    Pt states that he got pancreatitis.    . Quinolones     Patient was warned about not using Cipro and similar antibiotics. Recent studies have raised concern that fluoroquinolone antibiotics could be associated with an increased risk of aortic aneurysm Fluoroquinolones have non-antimicrobial properties that might jeopardise the integrity of the extracellular matrix of the vascular wall In a  propensity score matched cohort study in [place] there was a 66% increased rate of aortic aneurysm or dissection associated with oral fluoroquinolone use, compared wit    Social History: The patient  reports that he quit smoking about 44 years ago. His smoking use included cigarettes. He has a 40.00 pack-year smoking history. He quit smokeless tobacco use about 41 years ago.  His smokeless tobacco use included chew. He reports that he does not drink alcohol and does not use drugs.   Family History: The patient's family history includes Aneurysm in his mother and sister; Hypertension in his father; Kidney disease in his father.   Review of Systems: Please see the history of present illness.   All other systems are reviewed and negative.   Physical Exam: VS:  BP 132/80   Pulse 73   Ht 6' (1.829 m)   Wt 196 lb 12.8 oz (89.3 kg)   SpO2 96%   BMI 26.69 kg/m  .  BMI Body mass index is 26.69 kg/m.  Wt Readings from Last 3 Encounters:  05/15/20 196 lb 12.8 oz (89.3 kg)  04/04/20 210 lb (95.3 kg)  04/02/20 205 lb 0.4 oz (93 kg)    General: Pleasant. Elderly. Alert  and in no acute distress. His weight is down.   Cardiac: Regular rate and rhythm. No murmurs, rubs, or gallops. No edema.  Respiratory:  Lungs are clear to auscultation bilaterally with normal work of breathing.  GI: Soft and nontender.  MS: No deformity or atrophy. Gait and ROM intact.  Skin: Warm and dry. Color is normal.  Neuro:  Strength and sensation are intact  and no gross focal deficits noted.  Psych: Alert, appropriate and with normal affect.   LABORATORY DATA:  EKG:  EKG is not ordered today.    Lab Results  Component Value Date   WBC 6.5 12/25/2019   HGB 13.7 12/25/2019   HCT 41.9 12/25/2019   PLT 155 12/25/2019   GLUCOSE 93 12/25/2019   CHOL 105 12/25/2019   TRIG 305 (H) 12/25/2019   HDL 38 (L) 12/25/2019   LDLDIRECT 28.0 04/19/2015   LDLCALC 23 12/25/2019   ALT 23 12/25/2019   AST 19 12/25/2019   NA 142 12/25/2019   K 4.9 12/25/2019   CL 105 12/25/2019   CREATININE 1.79 (H) 12/25/2019   BUN 15 12/25/2019   CO2 23 12/25/2019   TSH 3.870 12/25/2019   PSA 0.2 11/08/2017   INR 1.04 06/02/2017   HGBA1C 5.0 07/22/2017     BNP (last 3 results) No results for input(s): BNP in the last 8760 hours.  ProBNP (last 3 results) No results for input(s): PROBNP in the last 8760 hours.   Other Studies Reviewed Today:  ECHO IMPRESSIONS 09/2019  1. Left ventricular ejection fraction, by visual estimation, is 60 to  65%. The left ventricle has normal function. There is no left ventricular  hypertrophy.  2. Abnormal septal motion consistent with left bundle branch block.  3. The left ventricle has no regional wall motion abnormalities.  4. Global right ventricle has normal systolic function.The right  ventricular size is normal. No increase in right ventricular wall  thickness.  5. Left atrial size was mildly dilated.  6. Right atrial size was normal.  7. The mitral valve is normal in structure. No evidence of mitral valve  regurgitation. No evidence of mitral stenosis.  8. The tricuspid valve is normal in structure.  9. The tricuspid valve is normal in structure. Tricuspid valve  regurgitation is not demonstrated.  10. The aortic valve is tricuspid. Aortic valve regurgitation is mild. No  evidence of aortic valve sclerosis or stenosis.  11. The pulmonic valve was normal in structure. Pulmonic valve  regurgitation is  not visualized.  12. 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  13. The inferior vena cava is normal in size with greater than 50%  respiratory variability, suggesting right atrial pressure of 3 mmHg.  14. Aortic root 99m. Consider CTA of aorta for correlation of  measurement.  15. The average left ventricular global longitudinal strain is -18.8 %.   In comparison to the previous echocardiogram(s): 11/25/16 EF 55-60%. Aortic  root dimension 465m   ASSESSMENT & PLAN:   1. HTN - BP is good here today - no changes made. Would continue his current regimen.   2. PAF - on low dose amiodarone - remains on anticoagulation with Eliquis also - remains in NSR - EKG from last month reviewed.   3. History of bradycardia - not on beta blocker or other AV nodal agents. He is not symptomatic.   4. Chronic anticoagulation - no bleeding - he did have a fall - cautioned him accordingly. We will continue  his current regimen.   5. NICM - he has had normalization of his EF - weight is down. BP ok. Euvolemic on exam.   6. CKD- followed by Renal with recent visit and labs.   7. Thoracic aortic aneurysm - we reviewed precautions - he has good BP control - for repeat scan next spring.    Current medicines are reviewed with the patient today.  The patient does not have concerns regarding medicines other than what has been noted above.  The following changes have been made:  See above.  Labs/ tests ordered today include:   No orders of the defined types were placed in this encounter.    Disposition:   FU with Dr. Marlou Porch in March with EKG. Overall, he is felt to be stable from our standpoint.       Patient is agreeable to this plan and will call if any problems develop in the interim.   SignedTruitt Merle, NP  05/15/2020 8:52 AM  [place] [address] [place], [place]  [address] Phone: [phone] Fax: [phone]

## 2020-05-09 DIAGNOSIS — N189 Chronic kidney disease, unspecified: Secondary | ICD-10-CM | POA: Diagnosis not present

## 2020-05-15 ENCOUNTER — Ambulatory Visit (INDEPENDENT_AMBULATORY_CARE_PROVIDER_SITE_OTHER): Payer: Medicare Other | Admitting: Nurse Practitioner

## 2020-05-15 ENCOUNTER — Encounter: Payer: Self-pay | Admitting: Nurse Practitioner

## 2020-05-15 ENCOUNTER — Other Ambulatory Visit: Payer: Self-pay

## 2020-05-15 VITALS — BP 132/80 | HR 73 | Ht 72.0 in | Wt 196.8 lb

## 2020-05-15 DIAGNOSIS — Z7901 Long term (current) use of anticoagulants: Secondary | ICD-10-CM

## 2020-05-15 DIAGNOSIS — I480 Paroxysmal atrial fibrillation: Secondary | ICD-10-CM

## 2020-05-15 DIAGNOSIS — I712 Thoracic aortic aneurysm, without rupture, unspecified: Secondary | ICD-10-CM

## 2020-05-15 DIAGNOSIS — I428 Other cardiomyopathies: Secondary | ICD-10-CM | POA: Diagnosis not present

## 2020-05-15 DIAGNOSIS — Z79899 Other long term (current) drug therapy: Secondary | ICD-10-CM

## 2020-05-15 NOTE — Patient Instructions (Addendum)
After Visit Summary:  We will be checking the following labs today - NONE   Medication Instructions:    Continue with your current medicines.    If you need a refill on your cardiac medications before your next appointment, please call your pharmacy.     Testing/Procedures To Be Arranged:  N/A  Follow-Up:   See Dr. Marlou Porch in March with EKG     At [facility], you and your health needs are our priority.  As part of our continuing mission to provide you with exceptional heart care, we have created designated Provider Care Teams.  These Care Teams include your primary Cardiologist (physician) and Advanced Practice Providers (APPs -  Physician Assistants and Nurse Practitioners) who all work together to provide you with the care you need, when you need it.  Special Instructions:  . Stay safe, wash your hands for at least 20 seconds and wear a mask when needed.  . It was good to talk with you today.    Call the [place] office at [phone] if you have any questions, problems or concerns.

## 2020-05-16 DIAGNOSIS — N183 Chronic kidney disease, stage 3 unspecified: Secondary | ICD-10-CM | POA: Diagnosis not present

## 2020-05-16 DIAGNOSIS — N2581 Secondary hyperparathyroidism of renal origin: Secondary | ICD-10-CM | POA: Diagnosis not present

## 2020-05-16 DIAGNOSIS — D631 Anemia in chronic kidney disease: Secondary | ICD-10-CM | POA: Diagnosis not present

## 2020-05-16 DIAGNOSIS — R82992 Hyperoxaluria: Secondary | ICD-10-CM | POA: Diagnosis not present

## 2020-05-16 DIAGNOSIS — I129 Hypertensive chronic kidney disease with stage 1 through stage 4 chronic kidney disease, or unspecified chronic kidney disease: Secondary | ICD-10-CM | POA: Diagnosis not present

## 2020-05-24 DIAGNOSIS — N183 Chronic kidney disease, stage 3 unspecified: Secondary | ICD-10-CM | POA: Diagnosis not present

## 2020-07-05 DIAGNOSIS — N183 Chronic kidney disease, stage 3 unspecified: Secondary | ICD-10-CM | POA: Diagnosis not present

## 2020-07-08 DIAGNOSIS — I129 Hypertensive chronic kidney disease with stage 1 through stage 4 chronic kidney disease, or unspecified chronic kidney disease: Secondary | ICD-10-CM | POA: Diagnosis not present

## 2020-07-08 DIAGNOSIS — N1832 Chronic kidney disease, stage 3b: Secondary | ICD-10-CM | POA: Diagnosis not present

## 2020-07-08 DIAGNOSIS — D631 Anemia in chronic kidney disease: Secondary | ICD-10-CM | POA: Diagnosis not present

## 2020-07-08 DIAGNOSIS — N2581 Secondary hyperparathyroidism of renal origin: Secondary | ICD-10-CM | POA: Diagnosis not present

## 2020-07-31 ENCOUNTER — Encounter (HOSPITAL_COMMUNITY): Payer: Self-pay | Admitting: Emergency Medicine

## 2020-07-31 ENCOUNTER — Other Ambulatory Visit: Payer: Self-pay

## 2020-07-31 ENCOUNTER — Emergency Department (HOSPITAL_COMMUNITY)
Admission: EM | Admit: 2020-07-31 | Discharge: 2020-08-01 | Disposition: A | Discharge status: Home / Self Care | Payer: No Typology Code available for payment source | Attending: Emergency Medicine | Admitting: Emergency Medicine

## 2020-07-31 ENCOUNTER — Emergency Department (HOSPITAL_COMMUNITY): Payer: No Typology Code available for payment source

## 2020-07-31 DIAGNOSIS — M25551 Pain in right hip: Secondary | ICD-10-CM | POA: Insufficient documentation

## 2020-07-31 DIAGNOSIS — I2510 Atherosclerotic heart disease of native coronary artery without angina pectoris: Secondary | ICD-10-CM | POA: Diagnosis not present

## 2020-07-31 DIAGNOSIS — S7001XA Contusion of right hip, initial encounter: Secondary | ICD-10-CM

## 2020-07-31 DIAGNOSIS — Z87891 Personal history of nicotine dependence: Secondary | ICD-10-CM | POA: Insufficient documentation

## 2020-07-31 DIAGNOSIS — R531 Weakness: Secondary | ICD-10-CM | POA: Diagnosis not present

## 2020-07-31 DIAGNOSIS — Z85038 Personal history of other malignant neoplasm of large intestine: Secondary | ICD-10-CM | POA: Insufficient documentation

## 2020-07-31 DIAGNOSIS — W010XXA Fall on same level from slipping, tripping and stumbling without subsequent striking against object, initial encounter: Secondary | ICD-10-CM | POA: Insufficient documentation

## 2020-07-31 DIAGNOSIS — Z96612 Presence of left artificial shoulder joint: Secondary | ICD-10-CM | POA: Insufficient documentation

## 2020-07-31 DIAGNOSIS — R0902 Hypoxemia: Secondary | ICD-10-CM | POA: Diagnosis not present

## 2020-07-31 DIAGNOSIS — M25561 Pain in right knee: Secondary | ICD-10-CM | POA: Insufficient documentation

## 2020-07-31 DIAGNOSIS — N1831 Chronic kidney disease, stage 3a: Secondary | ICD-10-CM | POA: Insufficient documentation

## 2020-07-31 DIAGNOSIS — R410 Disorientation, unspecified: Secondary | ICD-10-CM | POA: Diagnosis not present

## 2020-07-31 DIAGNOSIS — W19XXXA Unspecified fall, initial encounter: Secondary | ICD-10-CM | POA: Diagnosis not present

## 2020-07-31 DIAGNOSIS — R52 Pain, unspecified: Secondary | ICD-10-CM | POA: Diagnosis not present

## 2020-07-31 DIAGNOSIS — I129 Hypertensive chronic kidney disease with stage 1 through stage 4 chronic kidney disease, or unspecified chronic kidney disease: Secondary | ICD-10-CM | POA: Diagnosis not present

## 2020-07-31 DIAGNOSIS — I10 Essential (primary) hypertension: Secondary | ICD-10-CM | POA: Diagnosis not present

## 2020-07-31 LAB — CBC WITH DIFFERENTIAL/PLATELET
Abs Immature Granulocytes: 0.07 10*3/uL (ref 0.00–0.07)
Basophils Absolute: 0 10*3/uL (ref 0.0–0.1)
Basophils Relative: 0 %
Eosinophils Absolute: 0.1 10*3/uL (ref 0.0–0.5)
Eosinophils Relative: 1 %
HCT: 43.9 % (ref 39.0–52.0)
Hemoglobin: 14.4 g/dL (ref 13.0–17.0)
Immature Granulocytes: 1 %
Lymphocytes Relative: 7 %
Lymphs Abs: 0.8 10*3/uL (ref 0.7–4.0)
MCH: 29.9 pg (ref 26.0–34.0)
MCHC: 32.8 g/dL (ref 30.0–36.0)
MCV: 91.1 fL (ref 80.0–100.0)
Monocytes Absolute: 0.7 10*3/uL (ref 0.1–1.0)
Monocytes Relative: 6 %
Neutro Abs: 10.1 10*3/uL — ABNORMAL HIGH (ref 1.7–7.7)
Neutrophils Relative %: 85 %
Platelets: 125 10*3/uL — ABNORMAL LOW (ref 150–400)
RBC: 4.82 MIL/uL (ref 4.22–5.81)
RDW: 14.2 % (ref 11.5–15.5)
WBC: 11.9 10*3/uL — ABNORMAL HIGH (ref 4.0–10.5)
nRBC: 0 % (ref 0.0–0.2)

## 2020-07-31 NOTE — ED Provider Notes (Addendum)
[facility] EMERGENCY DEPARTMENT Provider Note   CSN: [id number] Arrival date & time: 07/31/20  2041     History Chief Complaint  Patient presents with  . Weakness    Derek [name] is a 79 y.o. male.  HPI    79 year old male comes in a chief complaint of fall.  Patient has history of A. fib, ascending aorta aneurysm.  He is on sleep medications.  Wife reports that patient had 2 falls between 830 and 9.  He is complaining of right-sided hip pain.  Patient is groggy, likely because of his sleeping meds and unable to tell me the circumstances around the fall.  The wife reports that patient was getting out of the bed and fell, and he needed help getting up.  He fell 1 more time thereafter.  Patient has no complaints from his side besides pain in his right hip.  Past Medical History:  Diagnosis Date  . Allergy   . Anemia   . Anxiety   . Aortic insufficiency    a. mild-mod by echo 09/2015.  . Arthritis    "knees; left shoulder" (09/21/2013)  . Ascending aortic aneurysm (HCC)    a. last measurement 5.3 cm 03/2016 -> f/u planned 09/2015 to continue to follow.  . Atrial fibrillation ([place])   . B12 deficiency    takes Vit 12 shot every 14days   . Bowel perforation ([place]) 08/27/2016  . Cataract   . CKD (chronic kidney disease) stage 3, GFR 30-59 ml/min (HCC) 08/22/2011  . Clotting disorder ([place])   . Colon cancer ([place])   . Colonic ischemia ([place]) 08/27/2016  . Crohn's disease ([place])   . Depression   . Enlarged prostate   . Enteric hyperoxaluria 02/21/2016  . GERD (gastroesophageal reflux disease)    takes Omeprazole daily  . Gout    takes Uloric and Colchicine daily  . Heart murmur   . Hepatitis C 1978   negtive RNA load - spontaneously cleared  . Hiatal hernia   . High output ileostomy ([place]) 06/02/2017  . History of blood transfusion 1978; 1990's; ?   "w/bowel resection; S/P allupurinol; ?" (09/21/2013)  . History of colon polyps   . History of kidney stones   . History of MRSA  infection 2010  . History of pulmonary embolism 2006   both legs and both lungs /notes 08/26/2008 (09/21/2013)  . History of small bowel obstruction   . History of staph infection 1978  . Hyperoxaluria    Intestinal  . Hypertension   . Insomnia    takes Trazodone nightly  . Internal hemorrhoids   . LV dysfunction    a. h/o EF 45-50% in 2015, normalized on subsequent echoes.  . Nephrolithiasis   . Nocardia infection   . Pancreatitis 2010   elevated lipase and amylase, stranding in tail of pancreas, ? from Humira  . Pancytopenia    Hx of  . Peripheral neuropathy    takes Gabapentin daily  . Pneumonia    hx of   . Post-traumatic stress syndrome    takes Paxil nightly  . PTSD (post-traumatic stress disorder)   . Pulmonary nodule    a. 16m by CT 05/2015, recommended f/u 6-12 months.  . RLS (restless legs syndrome)   . Rosacea conjunctivitis(372.31)    takes Minocin daily  . Secondary hyperparathyroidism ([place] 02/21/2016  . Sinus bradycardia   . Skin cancer    "cut/burned off left ear and face" (09/21/2013)  . Small bowel obstruction ([place]   .  Status post reversal of ileostomy 07/23/2017  . Thrombocytopenia ([place])    hx of    Patient Active Problem List   Diagnosis Date Noted  . Personal history of colon cancer 08/14/2019  . Elevated LFTs 03/12/2019  . Osteoarthritis of left knee 08/23/2018  . History of gout 08/23/2018  . Thoracic aortic aneurysm without rupture ([place]) 10/26/2017  . Malignant neoplasm of transverse colon ([place])   . Partial small bowel obstruction ([place]) 05/09/2017  . Aortic valve regurgitation 11/05/2016  . Chronic anticoagulation 08/18/2016  . Sinus bradycardia 08/17/2016  . S/P colon resection 08/14/2016  . Adenomatous polyp of transverse colon s/p partial colectomy 08/14/2016 08/14/2016  . Essential hypertension 05/27/2016  . Peripheral neuropathic pain 05/26/2016  . GERD (gastroesophageal reflux disease) 05/26/2016  . Paroxysmal atrial fibrillation  ([place]) 05/26/2016  . Lung nodule 05/12/2016  . SBO (small bowel obstruction) ([place]) 05/11/2016  . Enteric hyperoxaluria 02/21/2016  . Secondary hyperparathyroidism ([place]) 02/21/2016  . Pain in joint, shoulder region 10/12/2013  . Decreased range of motion of left shoulder 10/12/2013  . Muscle weakness (generalized) 10/12/2013  . Restriction of joint motion 10/12/2013  . S/P shoulder replacement 09/21/2013  . Nonischemic cardiomyopathy ([place]) 09/07/2013  . Anxiety 08/22/2011  . CKD (chronic kidney disease) stage 3, GFR 30-59 ml/min (HCC) 08/22/2011  . Thrombocytopenia ([place]) 08/22/2011  . CORONARY ATHEROSCLEROSIS NATIVE CORONARY ARTERY 06/25/2010  . Ascending aortic aneurysm ([place]) 06/26/2009  . Crohn's ileocolitis ([place]) 06/26/2009  . B12 DEFICIENCY 05/09/2008  . Gout 05/09/2008  . POST TRAUMATIC STRESS SYNDROME 05/09/2008  . GERD 05/09/2008  . HEPATITIS C Ab positive RNA neg, HX OF 05/09/2008  . PULMONARY EMBOLISM, HX OF 05/09/2008  . History of small bowel obstruction 05/09/2008    Past Surgical History:  Procedure Laterality Date  . ANKLE SURGERY Right   . APPENDECTOMY  1978  . BOWEL RESECTION  1978 X 2  . CARDIOVERSION N/A 05/29/2016   Procedure: CARDIOVERSION;  Surgeon: Sanda Klein, MD;  Location: [facility];  Service: Cardiovascular;  Laterality: N/A;  . CHOLECYSTECTOMY    . Colon Cancer    . COLON RESECTION N/A 08/14/2016   Procedure: LAPAROSCOPIC RESECTION TRANSVERSE COLON;  Surgeon: [name], MD;  Location: [facility];  Service: General;  Laterality: N/A;  . COLON SURGERY    . COLONOSCOPY    . ESOPHAGOGASTRODUODENOSCOPY    . EYE SURGERY     cataract surgery bilateral  . FOOT SURGERY Right    "took gout out"  . HEMICOLECTOMY Right   . ILEOCECETOMY  1978   [name] 05/10/2000  (09/21/2013)  . ILEOSTOMY    . ILEOSTOMY CLOSURE N/A 07/23/2017   Procedure: ILEOSTOMY REVERSAL ;  Surgeon: [name], MD;  Location: [facility];  Service: General;  Laterality: N/A;  .  INGUINAL HERNIA REPAIR Right   . IR FLUORO GUIDE CV LINE RIGHT  05/07/2017  . IR REMOVAL TUN CV CATH W/O FL  10/08/2017  . IR US GUIDE VASC ACCESS RIGHT  05/07/2017  . KNEE ARTHROSCOPY Left   . LAPAROTOMY N/A 08/27/2016   Procedure: EXPLORATORYLAPAROTOMY, LYSIS OF ADHESIONS, ILEOSTOMY, RIGHT COLECTOMY;  Surgeon: [name], MD;  Location: [facility];  Service: General;  Laterality: N/A;  . LIGAMENT REPAIR Left   . POLYPECTOMY    . TEE WITHOUT CARDIOVERSION N/A 05/29/2016   Procedure: TRANSESOPHAGEAL ECHOCARDIOGRAM (TEE);  Surgeon: Sanda Klein, MD;  Location: [place];  Service: Cardiovascular;  Laterality: N/A;  . TOTAL SHOULDER ARTHROPLASTY Left 09/21/2013  . TOTAL SHOULDER ARTHROPLASTY Left  09/21/2013   Procedure: LEFT TOTAL SHOULDER ARTHROPLASTY;  Surgeon: Marin Shutter, MD;  Location: [place];  Service: Orthopedics;  Laterality: Left;       Family History  Problem Relation Age of Onset  . Kidney disease Father   . Hypertension Father   . Aneurysm Mother   . Aneurysm Sister   . Esophageal cancer Neg Hx   . Stomach cancer Neg Hx   . Rectal cancer Neg Hx   . Colon cancer Neg Hx     Social History   Tobacco Use  . Smoking status: Former Smoker    Packs/day: 2.00    Years: 20.00    Pack years: 40.00    Types: Cigarettes    Quit date: 08/04/1975    Years since quitting: 45.0  . Smokeless tobacco: Former Systems developer    Types: Chew    Quit date: 08/03/1978  . Tobacco comment: 09/21/2013 "quit smoking in the late 1970's; stopped chewing couple years after I quit smoking"  Vaping Use  . Vaping Use: Never used  Substance Use Topics  . Alcohol use: No  . Drug use: No    Home Medications Prior to Admission medications   Medication Sig Start Date End Date Taking? Authorizing Provider  Acidophilus Lactobacillus CAPS Take 1 capsule by mouth daily. 06/08/16   [name], MD  amiodarone (PACERONE) 200 MG tablet Take 1 tablet (200 mg total) by mouth daily. 11/05/16   End, Harrell Gave, MD   calcium carbonate (TUMS - DOSED IN MG ELEMENTAL CALCIUM) 500 MG chewable tablet Chew 2 tablets by mouth 2 (two) times daily.    [provider]  Cholecalciferol (VITAMIN D) 2000 units CAPS Take 2,000 Units by mouth daily.    [provider]  cyanocobalamin (,VITAMIN B-12,) 1000 MCG/ML injection Inject 1,000 mcg into the muscle. THREE TIMES A MONTH    [provider]  ELIQUIS 5 MG TABS tablet Take 1 tablet (5 mg total) by mouth 2 (two) times daily. 11/05/16   End, Harrell Gave, MD  famotidine (PEPCID) 20 MG tablet Take 20 mg by mouth daily.    [provider]  febuxostat (ULORIC) 40 MG tablet Take 40 mg by mouth daily.    [provider]  ferrous sulfate 325 (65 FE) MG tablet Take 325 mg by mouth daily with breakfast.    [provider]  gabapentin (NEURONTIN) 100 MG capsule Take one (1) capsules (100 mg) by mouth each morning and 330m at bedtime.    [provider]  HYDROcodone-acetaminophen (NORCO) 10-325 MG tablet Take 1-2 tablets by mouth 4 (four) times daily as needed. 12/05/19   [provider]  levothyroxine (SYNTHROID) 50 MCG tablet Take 50 mcg by mouth daily before breakfast.    [provider]  lidocaine (LIDODERM) 5 % Place 1 patch onto the skin daily. Remove & Discard patch within 12 hours or as directed by MD 04/02/20   HMargarita Mail PA-C  magnesium oxide (MAGNESIUM-OXIDE) 400 (241.3 Mg) MG tablet Take 1,200 mg by mouth 2 (two) times daily.     [provider]  Multiple Vitamin (MULTIVITAMIN WITH MINERALS) TABS tablet Take 1 tablet by mouth daily.    [provider]  oxyCODONE (ROXICODONE) 5 MG immediate release tablet Take 0.5-1 tablets (2.5-5 mg total) by mouth every 6 (six) hours as needed for severe pain. 04/04/20   GGrace Isaac MD  PARoxetine (PAXIL) 20 MG tablet Take 20 mg by mouth daily.     [provider]  sodium bicarbonate 650 MG tablet Take 1,300 mg by mouth 2 (two)  times daily.     [provider]  traZODone (DESYREL) 100 MG tablet Take 100 mg by mouth at bedtime.    [provider]  Calcium Carbonate (CALCIUM 500 PO) Take 2 tablets by mouth 2 (two) times daily.   05/11/16  [provider]    Allergies    Lorazepam, Humira [adalimumab], Calcium d-glucarate [calcium saccharate], and Quinolones  Review of Systems   Review of Systems  Constitutional: Positive for activity change.  Respiratory: Negative for shortness of breath.   Cardiovascular: Negative for chest pain.  Gastrointestinal: Negative for nausea and vomiting.  Musculoskeletal: Positive for arthralgias.  Neurological: Negative for headaches.    Physical Exam Updated Vital Signs BP (!) 144/103   Pulse 74   Temp 99.3 F (37.4 C) (Oral)   Resp (!) 30   Ht 6' (1.829 m)   Wt 89.3 kg   SpO2 99%   BMI 26.70 kg/m   Physical Exam Vitals and nursing note reviewed.  Constitutional:      Appearance: He is well-developed.  HENT:     Head: Atraumatic.  Cardiovascular:     Rate and Rhythm: Normal rate.  Pulmonary:     Effort: Pulmonary effort is normal.  Musculoskeletal:     Cervical back: Neck supple.  Skin:    General: Skin is warm.  Neurological:     Mental Status: He is alert and oriented to person, place, and time.     ED Results / Procedures / Treatments   Labs (all labs ordered are listed, but only abnormal results are displayed) Labs Reviewed  CBC WITH DIFFERENTIAL/PLATELET  COMPREHENSIVE METABOLIC PANEL    EKG EKG Interpretation  Date/Time:  Wednesday July 31 2020 20:49:28 EST Ventricular Rate:  80 PR Interval:    QRS Duration: 110 QT Interval:  371 QTC Calculation: 428 R Axis:   -49 Text Interpretation: Age not entered, assumed to be  79 years old for purpose of ECG interpretation Sinus rhythm Left anterior fascicular block Abnormal R-wave progression, late transition Left ventricular hypertrophy ST elev, probable normal early  repol pattern No acute changes No significant change since last tracing Confirmed by Varney Biles [phone]) on 07/31/2020 9:31:44 PM   Radiology DG Knee 2 Views Right  Result Date: 07/31/2020 CLINICAL DATA:  Multiple falls, right knee pain EXAM: RIGHT KNEE - 1-2 VIEW COMPARISON:  None. FINDINGS: Frontal and lateral views of the right knee demonstrate no fracture, subluxation, or dislocation. Chondrocalcinosis is seen within the medial and lateral compartments. Joint spaces are relatively well preserved. There is no joint effusion. The soft tissues are unremarkable. IMPRESSION: 1. No acute displaced fracture. 2. Chondrocalcinosis of the medial and lateral compartments. Electronically Signed   By: Randa Ngo M.D.   On: 07/31/2020 23:15   DG Hip Unilat W or Wo Pelvis 2-3 Views Right  Result Date: 07/31/2020 CLINICAL DATA:  [place], right hip pain EXAM: DG HIP (WITH OR WITHOUT PELVIS) 2-3V RIGHT COMPARISON:  None. FINDINGS: Frontal view of the pelvis as well as frontal and frogleg lateral views of the right hip are obtained. No fracture, subluxation, or dislocation. Joint spaces are well preserved. Sacroiliac joints are normal. Soft tissues are unremarkable. IMPRESSION: 1. Unremarkable pelvis and right hip. Electronically Signed   By: Randa Ngo M.D.   On: 07/31/2020 23:11    Procedures Procedures (including critical care time)  Medications Ordered in ED Medications -  No data to display  ED Course  I have reviewed the triage vital signs and the nursing notes.  Pertinent labs & imaging results that were available during my care of the patient were reviewed by me and considered in my medical decision making (see chart for details).  Clinical Course as of 08/01/20 0023  Thu Aug 01, 2020  0023 Patient has ambulated in the ED.  Wife made aware of the findings.  He stable for discharge. [AN]    Clinical Course User Index [AN] Varney Biles, MD   MDM Rules/Calculators/A&P                           DDx includes: - Mechanical falls - ICH - Fractures - Contusions - Soft tissue injury  Patient had 2 mechanical fall prior to ED arrival.  It appears to be that the falls were due to oversedation.  Neuro exam is nonfocal.  Patient has tenderness over the right hip.  We will get x-ray, and reassess.  CT head also ordered.  11:23 PM X-rays are reassuring.  We will ambulate the patient.  He was unable to ambulate and will need CT scan.  Final Clinical Impression(s) / ED Diagnoses Final diagnoses:  None    Rx / DC Orders ED Discharge Orders    None       Varney Biles, MD 07/31/20 [phone]

## 2020-07-31 NOTE — ED Notes (Signed)
Patient transported to CT 

## 2020-07-31 NOTE — ED Triage Notes (Addendum)
Per EMS, pt has had weakness throughout the day and has fallen out of med multiple times tonight. C/o right hip pain tender to palpitation but no rotation, shortening or deformity noted. Pt is alert to person only at this time.

## 2020-08-01 LAB — COMPREHENSIVE METABOLIC PANEL
ALT: 30 U/L (ref 0–44)
AST: 28 U/L (ref 15–41)
Albumin: 4.1 g/dL (ref 3.5–5.0)
Alkaline Phosphatase: 108 U/L (ref 38–126)
Anion gap: 8 (ref 5–15)
BUN: 14 mg/dL (ref 8–23)
CO2: 18 mmol/L — ABNORMAL LOW (ref 22–32)
Calcium: 9 mg/dL (ref 8.9–10.3)
Chloride: 112 mmol/L — ABNORMAL HIGH (ref 98–111)
Creatinine, Ser: 1.89 mg/dL — ABNORMAL HIGH (ref 0.61–1.24)
GFR, Estimated: 36 mL/min — ABNORMAL LOW (ref >60)
Glucose, Bld: 104 mg/dL — ABNORMAL HIGH (ref 70–99)
Potassium: 4.9 mmol/L (ref 3.5–5.1)
Sodium: 138 mmol/L (ref 135–145)
Total Bilirubin: 1.2 mg/dL (ref 0.3–1.2)
Total Protein: 7 g/dL (ref 6.5–8.1)

## 2020-08-01 NOTE — Discharge Instructions (Signed)
The x-rays do not show any fracture.  Derek [name] has been able to bear weight.  We suspect that he has hematoma/contusion.  Alternate between ice and heat over the next 24 hours. Take Tylenol.

## 2020-08-03 DIAGNOSIS — B029 Zoster without complications: Secondary | ICD-10-CM

## 2020-08-03 DIAGNOSIS — U071 COVID-19: Secondary | ICD-10-CM

## 2020-08-03 HISTORY — DX: COVID-19: U07.1

## 2020-08-03 HISTORY — DX: Zoster without complications: B02.9

## 2020-09-16 DIAGNOSIS — L570 Actinic keratosis: Secondary | ICD-10-CM | POA: Diagnosis not present

## 2020-09-16 DIAGNOSIS — L819 Disorder of pigmentation, unspecified: Secondary | ICD-10-CM | POA: Diagnosis not present

## 2020-09-16 DIAGNOSIS — L718 Other rosacea: Secondary | ICD-10-CM | POA: Diagnosis not present

## 2020-09-16 DIAGNOSIS — L821 Other seborrheic keratosis: Secondary | ICD-10-CM | POA: Diagnosis not present

## 2020-10-03 ENCOUNTER — Telehealth: Payer: Self-pay | Admitting: Cardiology

## 2020-10-03 DIAGNOSIS — I480 Paroxysmal atrial fibrillation: Secondary | ICD-10-CM

## 2020-10-03 DIAGNOSIS — I428 Other cardiomyopathies: Secondary | ICD-10-CM

## 2020-10-03 DIAGNOSIS — I351 Nonrheumatic aortic (valve) insufficiency: Secondary | ICD-10-CM

## 2020-10-03 MED ORDER — AMIODARONE HCL 200 MG PO TABS: 200.0000 mg | ORAL_TABLET | Freq: Every day | ORAL | 2 refills | Status: DC

## 2020-10-03 NOTE — Telephone Encounter (Signed)
Pt's medication was sent to pt's pharmacy as requested. Confirmation received.  °

## 2020-10-03 NOTE — Telephone Encounter (Signed)
*  STAT* If patient is at the pharmacy, call can be transferred to refill team.   1. Which medications need to be refilled? (please list name of each medication and dose if known) new prescription for  Amiodarone     2. Which pharmacy/location (including street and city if local pharmacy) is medication to be sent to?Please  fax to [facility] in  [place] [phone]    3. Do they need a 30 day or 90 day supply? 30 and refills

## 2020-10-10 ENCOUNTER — Other Ambulatory Visit: Payer: Self-pay | Admitting: Cardiothoracic Surgery

## 2020-10-10 DIAGNOSIS — I7121 Aneurysm of the ascending aorta, without rupture: Secondary | ICD-10-CM

## 2020-10-10 DIAGNOSIS — I712 Thoracic aortic aneurysm, without rupture, unspecified: Secondary | ICD-10-CM

## 2020-10-17 ENCOUNTER — Other Ambulatory Visit: Payer: Self-pay

## 2020-10-17 ENCOUNTER — Other Ambulatory Visit: Payer: Self-pay | Admitting: *Deleted

## 2020-10-17 ENCOUNTER — Encounter: Payer: Self-pay | Admitting: *Deleted

## 2020-10-17 ENCOUNTER — Encounter: Payer: Self-pay | Admitting: Cardiology

## 2020-10-17 ENCOUNTER — Ambulatory Visit (INDEPENDENT_AMBULATORY_CARE_PROVIDER_SITE_OTHER): Payer: Medicare Other | Admitting: Cardiology

## 2020-10-17 VITALS — BP 120/70 | HR 62 | Ht 72.0 in | Wt 203.0 lb

## 2020-10-17 DIAGNOSIS — Z79899 Other long term (current) drug therapy: Secondary | ICD-10-CM | POA: Diagnosis not present

## 2020-10-17 DIAGNOSIS — I480 Paroxysmal atrial fibrillation: Secondary | ICD-10-CM

## 2020-10-17 DIAGNOSIS — I428 Other cardiomyopathies: Secondary | ICD-10-CM

## 2020-10-17 DIAGNOSIS — I2510 Atherosclerotic heart disease of native coronary artery without angina pectoris: Secondary | ICD-10-CM

## 2020-10-17 DIAGNOSIS — I712 Thoracic aortic aneurysm, without rupture, unspecified: Secondary | ICD-10-CM

## 2020-10-17 MED ORDER — ROSUVASTATIN CALCIUM 10 MG PO TABS
10.0000 mg | ORAL_TABLET | Freq: Every day | ORAL | 3 refills | Status: DC
Start: 2020-10-17 — End: 2023-09-13

## 2020-10-17 MED ORDER — ROSUVASTATIN CALCIUM 10 MG PO TABS: 10.0000 mg | ORAL_TABLET | Freq: Every day | ORAL | 3 refills | Status: DC

## 2020-10-17 NOTE — Patient Instructions (Signed)
Medication Instructions:  Please start Crestor 10 mg oce daily.  Continue all other medications as listed.  *If you need a refill on your cardiac medications before your next appointment, please call your pharmacy*  Lab Work: Please return in 3 months for blood work (Lipid/ALT)  If you have labs (blood work) drawn today and your tests are completely normal, you will receive your results only by: Marland Kitchen MyChart Message (if you have MyChart) OR . A paper copy in the mail If you have any lab test that is abnormal or we need to change your treatment, we will call you to review the results.  Testing/Procedures: Your physician has requested that you have a lexiscan myoview. For further information please visit [email or web address]. Please follow instruction sheet, as given.  Follow-Up: At [facility], you and your health needs are our priority.  As part of our continuing mission to provide you with exceptional heart care, we have created designated Provider Care Teams.  These Care Teams include your primary Cardiologist (physician) and Advanced Practice Providers (APPs -  Physician Assistants and Nurse Practitioners) who all work together to provide you with the care you need, when you need it.  We recommend signing up for the patient portal called "MyChart".  Sign up information is provided on this After Visit Summary.  MyChart is used to connect with patients for Virtual Visits (Telemedicine).  Patients are able to view lab/test results, encounter notes, upcoming appointments, etc.  Non-urgent messages can be sent to your provider as well.   To learn more about what you can do with MyChart, go to [email or web address].    Your next appointment:   6 month(s)  The format for your next appointment:   In Person  Provider:   Candee Furbish, MD   Thank you for choosing [facility]!!

## 2020-10-17 NOTE — Progress Notes (Signed)
Cardiology Office Note:    Date:  10/17/2020   ID:  Derek [name], DOB [birth year], MRN [id number]  PCP:  Sharilyn Sites, MD   [place]  Cardiologist:  Candee Furbish, MD  Advanced Practice Provider:  No care team member to display Electrophysiologist:  None       Referring MD: Sharilyn Sites, MD     History of Present Illness:    Derek [name] is a 80 y.o. male here for the follow-up of paroxysmal atrial fibrillation, nonischemic cardiomyopathy, aortic root aneurysm, Crohn's disease with prior colectomy, colon cancer, remote PE and chronic kidney disease followed by renal.  Last saw Truitt Merle in October 2021.  Overall doing reasonably well.  No chest pain, no fevers chills nausea vomiting.  No dizziness or lightheadedness.  Did suffer a fall in the past.  Hit his left chest.  Takes B12 injections.  No prior cardiac catheterization.  Personally reviewed his CT scans with him to show him his aorta as well as coronary artery calcification.  Past Medical History:  Diagnosis Date  . Allergy   . Anemia   . Anxiety   . Aortic insufficiency    a. mild-mod by echo 09/2015.  . Arthritis    "knees; left shoulder" (09/21/2013)  . Ascending aortic aneurysm (HCC)    a. last measurement 5.3 cm 03/2016 -> f/u planned 09/2015 to continue to follow.  . Atrial fibrillation ([place])   . B12 deficiency    takes Vit 12 shot every 14days   . Bowel perforation ([place]) 08/27/2016  . Cataract   . CKD (chronic kidney disease) stage 3, GFR 30-59 ml/min (HCC) 08/22/2011  . Clotting disorder ([place])   . Colon cancer ([place])   . Colonic ischemia ([place]) 08/27/2016  . Crohn's disease ([place])   . Depression   . Enlarged prostate   . Enteric hyperoxaluria 02/21/2016  . GERD (gastroesophageal reflux disease)    takes Omeprazole daily  . Gout    takes Uloric and Colchicine daily  . Heart murmur   . Hepatitis C 1978   negtive RNA load - spontaneously cleared  . Hiatal hernia   .  High output ileostomy ([place]) 06/02/2017  . History of blood transfusion 1978; 1990's; ?   "w/bowel resection; S/P allupurinol; ?" (09/21/2013)  . History of colon polyps   . History of kidney stones   . History of MRSA infection 2010  . History of pulmonary embolism 2006   both legs and both lungs /notes 08/26/2008 (09/21/2013)  . History of small bowel obstruction   . History of staph infection 1978  . Hyperoxaluria    Intestinal  . Hypertension   . Insomnia    takes Trazodone nightly  . Internal hemorrhoids   . LV dysfunction    a. h/o EF 45-50% in 2015, normalized on subsequent echoes.  . Nephrolithiasis   . Nocardia infection   . Pancreatitis 2010   elevated lipase and amylase, stranding in tail of pancreas, ? from Humira  . Pancytopenia    Hx of  . Peripheral neuropathy    takes Gabapentin daily  . Pneumonia    hx of   . Post-traumatic stress syndrome    takes Paxil nightly  . PTSD (post-traumatic stress disorder)   . Pulmonary nodule    a. 15m by CT 05/2015, recommended f/u 6-12 months.  . RLS (restless legs syndrome)   . Rosacea conjunctivitis(372.31)    takes Minocin daily  . Secondary hyperparathyroidism (  [place]) 02/21/2016  . Sinus bradycardia   . Skin cancer    "cut/burned off left ear and face" (09/21/2013)  . Small bowel obstruction ([place])   . Status post reversal of ileostomy 07/23/2017  . Thrombocytopenia ([place])    hx of    Past Surgical History:  Procedure Laterality Date  . ANKLE SURGERY Right   . APPENDECTOMY  1978  . BOWEL RESECTION  1978 X 2  . CARDIOVERSION N/A 05/29/2016   Procedure: CARDIOVERSION;  Surgeon: Sanda Klein, MD;  Location: [facility];  Service: Cardiovascular;  Laterality: N/A;  . CHOLECYSTECTOMY    . Colon Cancer    . COLON RESECTION N/A 08/14/2016   Procedure: LAPAROSCOPIC RESECTION TRANSVERSE COLON;  Surgeon: [name], MD;  Location: [facility];  Service: General;  Laterality: N/A;  . COLON SURGERY    . COLONOSCOPY    .  ESOPHAGOGASTRODUODENOSCOPY    . EYE SURGERY     cataract surgery bilateral  . FOOT SURGERY Right    "took gout out"  . HEMICOLECTOMY Right   . ILEOCECETOMY  1978   [name] 05/10/2000  (09/21/2013)  . ILEOSTOMY    . ILEOSTOMY CLOSURE N/A 07/23/2017   Procedure: ILEOSTOMY REVERSAL ;  Surgeon: [name], MD;  Location: [facility];  Service: General;  Laterality: N/A;  . INGUINAL HERNIA REPAIR Right   . IR FLUORO GUIDE CV LINE RIGHT  05/07/2017  . IR REMOVAL TUN CV CATH W/O FL  10/08/2017  . IR US GUIDE VASC ACCESS RIGHT  05/07/2017  . KNEE ARTHROSCOPY Left   . LAPAROTOMY N/A 08/27/2016   Procedure: EXPLORATORYLAPAROTOMY, LYSIS OF ADHESIONS, ILEOSTOMY, RIGHT COLECTOMY;  Surgeon: [name], MD;  Location: [facility];  Service: General;  Laterality: N/A;  . LIGAMENT REPAIR Left   . POLYPECTOMY    . TEE WITHOUT CARDIOVERSION N/A 05/29/2016   Procedure: TRANSESOPHAGEAL ECHOCARDIOGRAM (TEE);  Surgeon: Sanda Klein, MD;  Location: [facility];  Service: Cardiovascular;  Laterality: N/A;  . TOTAL SHOULDER ARTHROPLASTY Left 09/21/2013  . TOTAL SHOULDER ARTHROPLASTY Left 09/21/2013   Procedure: LEFT TOTAL SHOULDER ARTHROPLASTY;  Surgeon: Marin Shutter, MD;  Location: [place];  Service: Orthopedics;  Laterality: Left;    Current Medications: Current Meds  Medication Sig  . Acidophilus Lactobacillus CAPS Take 1 capsule by mouth daily.  Marland Kitchen amiodarone (PACERONE) 200 MG tablet Take 1 tablet (200 mg total) by mouth daily.  . calcium carbonate (TUMS - DOSED IN MG ELEMENTAL CALCIUM) 500 MG chewable tablet Chew 2 tablets by mouth 2 (two) times daily.  . Cholecalciferol (VITAMIN D) 2000 units CAPS Take 2,000 Units by mouth daily.  . cyanocobalamin (,VITAMIN B-12,) 1000 MCG/ML injection Inject 1,000 mcg into the muscle. THREE TIMES A MONTH  . ELIQUIS 5 MG TABS tablet Take 1 tablet (5 mg total) by mouth 2 (two) times daily.  . famotidine (PEPCID) 20 MG tablet Take 20 mg by mouth daily.  . febuxostat (ULORIC) 40 MG  tablet Take 40 mg by mouth daily.  . ferrous sulfate 325 (65 FE) MG tablet Take 325 mg by mouth daily with breakfast.  . gabapentin (NEURONTIN) 100 MG capsule Take one (1) capsules (100 mg) by mouth each morning and 358m at bedtime.  .Marland KitchenHYDROcodone-acetaminophen (NORCO) 10-325 MG tablet Take 1-2 tablets by mouth 4 (four) times daily as needed.  .Marland Kitchenlevothyroxine (SYNTHROID) 50 MCG tablet Take 50 mcg by mouth daily before breakfast.  . lidocaine (LIDODERM) 5 % Place 1 patch onto the skin daily. Remove & Discard  patch within 12 hours or as directed by MD  . magnesium oxide (MAG-OX) 400 (241.3 Mg) MG tablet Take 1,200 mg by mouth 2 (two) times daily.   . Multiple Vitamin (MULTIVITAMIN WITH MINERALS) TABS tablet Take 1 tablet by mouth daily.  Marland Kitchen oxyCODONE (ROXICODONE) 5 MG immediate release tablet Take 0.5-1 tablets (2.5-5 mg total) by mouth every 6 (six) hours as needed for severe pain.  Marland Kitchen PARoxetine (PAXIL) 20 MG tablet Take 20 mg by mouth daily.   . rosuvastatin (CRESTOR) 10 MG tablet Take 1 tablet (10 mg total) by mouth daily.  . sodium bicarbonate 650 MG tablet Take 1,300 mg by mouth 2 (two) times daily.   . traZODone (DESYREL) 100 MG tablet Take 100 mg by mouth at bedtime.     Allergies:   Lorazepam, Humira [adalimumab], Calcium d-glucarate [calcium saccharate], and Quinolones   Social History   Socioeconomic History  . Marital status: Married    Spouse name: [name]  . Number of children: 2  . Years of education: Not on file  . Highest education level: Not on file  Occupational History  . Occupation: Retired  Tobacco Use  . Smoking status: Former Smoker    Packs/day: 2.00    Years: 20.00    Pack years: 40.00    Types: Cigarettes    Quit date: 08/04/1975    Years since quitting: 45.2  . Smokeless tobacco: Former Systems developer    Types: Chew    Quit date: 08/03/1978  . Tobacco comment: 09/21/2013 "quit smoking in the late 1970's; stopped chewing couple years after I quit smoking"  Vaping Use  .  Vaping Use: Never used  Substance and Sexual Activity  . Alcohol use: No  . Drug use: No  . Sexual activity: Not Currently  Other Topics Concern  . Not on file  Social History Narrative   Married 2 children and 6 grandchildren all local   Leisure centre manager   Daily caffeine   Does not exercise regularly   Social Determinants of Health   Financial Resource Strain: Not on file  Food Insecurity: Not on file  Transportation Needs: Not on file  Physical Activity: Not on file  Stress: Not on file  Social Connections: Not on file     Family History: The patient's family history includes Aneurysm in his mother and sister; Hypertension in his father; Kidney disease in his father. There is no history of Esophageal cancer, Stomach cancer, Rectal cancer, or Colon cancer.  ROS:   Please see the history of present illness.    All other systems reviewed and are negative.  EKGs/Labs/Other Studies Reviewed:    The following studies were reviewed today:  ECHOIMPRESSIONS2/2021  1. Left ventricular ejection fraction, by visual estimation, is 60 to  65%. The left ventricle has normal function. There is no left ventricular  hypertrophy.  2. Abnormal septal motion consistent with left bundle branch block.  3. The left ventricle has no regional wall motion abnormalities.  4. Global right ventricle has normal systolic function.The right  ventricular size is normal. No increase in right ventricular wall  thickness.  5. Left atrial size was mildly dilated.  6. Right atrial size was normal.  7. The mitral valve is normal in structure. No evidence of mitral valve  regurgitation. No evidence of mitral stenosis.  8. The tricuspid valve is normal in structure.  9. The tricuspid valve is normal in structure. Tricuspid valve  regurgitation is not demonstrated.  10. The aortic valve is  tricuspid. Aortic valve regurgitation is mild. No  evidence of aortic valve sclerosis or stenosis.  11.  The pulmonic valve was normal in structure. Pulmonic valve  regurgitation is not visualized.  12. 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  13. The inferior vena cava is normal in size with greater than 50%  respiratory variability, suggesting right atrial pressure of 3 mmHg.  14. Aortic root 35m. Consider CTA of aorta for correlation of  measurement.  15. The average left ventricular global longitudinal strain is -18.8 %.   In comparison to the previous echocardiogram(s): 11/25/16 EF 55-60%. Aortic  root dimension 4104m   CT of the chest without contrast on 04/02/2020: Cardiovascular: Atherosclerosis of thoracic aorta is noted. Aortic root measures 5.1 cm in diameter. Descending thoracic aorta measures 4.7 cm in diameter. Normal cardiac size. No pericardial effusion. Coronary artery calcifications are noted.   EKG:  07/31/2020: Sinus rhythm 76 with left anterior fascicular block  Recent Labs: 12/25/2019: TSH 3.870 07/31/2020: ALT 30; BUN 14; Creatinine, Ser 1.89; Hemoglobin 14.4; Platelets 125; Potassium 4.9; Sodium 138  Recent Lipid Panel    Component Value Date/Time   CHOL 105 12/25/2019 1042   TRIG 305 (H) 12/25/2019 1042   HDL 38 (L) 12/25/2019 1042   CHOLHDL 2.8 12/25/2019 1042   CHOLHDL 3 04/19/2015 1403   VLDL 75.4 (H) 04/19/2015 1403   LDLCALC 23 12/25/2019 1042   LDLDIRECT 28.0 04/19/2015 1403     Risk Assessment/Calculations:      Physical Exam:    VS:  BP 120/70 (BP Location: Left Arm, Patient Position: Sitting, Cuff Size: Normal)   Pulse 62   Ht 6' (1.829 m)   Wt 203 lb (92.1 kg)   SpO2 97%   BMI 27.53 kg/m     Wt Readings from Last 3 Encounters:  10/17/20 203 lb (92.1 kg)  07/31/20 196 lb 13.9 oz (89.3 kg)  05/15/20 196 lb 12.8 oz (89.3 kg)     GEN:  Well nourished, well developed in no acute distress HEENT: Normal NECK: No JVD; No carotid bruits LYMPHATICS: No lymphadenopathy CARDIAC: RRR, no murmurs, rubs,  gallops RESPIRATORY:  Clear to auscultation without rales, wheezing or rhonchi  ABDOMEN: Soft, non-tender, non-distended MUSCULOSKELETAL:  No edema; No deformity  SKIN: Warm and dry NEUROLOGIC:  Alert and oriented x 3 PSYCHIATRIC:  Normal affect   ASSESSMENT:    1. PAF (paroxysmal atrial fibrillation) ([facility]  2. Nonischemic cardiomyopathy ([facility]  3. Thoracic aortic aneurysm without rupture ([facility]  4. Atherosclerosis of native coronary artery of native heart without angina pectoris   5. Medication management    PLAN:    In order of problems listed above:  Nonischemic cardiomyopathy -Has had normalization of his ejection fraction see echocardiogram as above.  Reviewed.  Remains on amiodarone 20034mD for rhythm control of his atrial fibrillation.  He is not on ACE inhibitor or ARB secondary to renal disease.  He is not on beta-blocker because of bradycardia.  NYHA class I type symptoms.  Doing well.  Paroxysmal atrial fibrillation -On low-dose amiodarone, doing well with Eliquis.  No bleeding.  Remaining in sinus rhythm. -Following lab work closely, thyroid liver CBC etc. [facility] [facility]  Thoracic aortic aneurysm -Precautions reviewed.  5 cm noted on August noncontrast CT.  He is going to be following up with Dr. AtkOrvan Seen cardiothoracic surgery as well.  Used to see  Dr. Servando Snare.  Chronic kidney disease 3b -Followed by nephrology.  Avoid NSAIDs. -- Creat 2.2 on 09/30/20. ALT 31, Hgb 13.8  Coronary artery disease/coronary artery calcification -Significant LAD territory plaque noted on last CT scan in August 2021.  It has been several years since he has had a pharmacologic stress test.  We will go ahead and check a nuclear stress test.  He will occasionally have atypical chest discomfort type symptoms for instance when laying down may feel some shortness in his chest wall.  This is likely musculoskeletal. -Given his coronary plaque, we will  go ahead and start him on Crestor 10 mg once a day.  Goal dose will be 20 mg once a day.  His triglycerides were 305 previously LDL 23 HDL 38 total cholesterol 105.  In 3 months we will go ahead and recheck a lipid panel with ALT.      Medication Adjustments/Labs and Tests Ordered: Current medicines are reviewed at length with the patient today.  Concerns regarding medicines are outlined above.  Orders Placed This Encounter  Procedures  . ALT  . Lipid panel  . MYOCARDIAL PERFUSION IMAGING  . EKG 12-Lead   Meds ordered this encounter  Medications  . rosuvastatin (CRESTOR) 10 MG tablet    Sig: Take 1 tablet (10 mg total) by mouth daily.    Dispense:  90 tablet    Refill:  3    Patient Instructions  Medication Instructions:  Please start Crestor 10 mg oce daily.  Continue all other medications as listed.  *If you need a refill on your cardiac medications before your next appointment, please call your pharmacy*  Lab Work: Please return in 3 months for blood work (Lipid/ALT)  If you have labs (blood work) drawn today and your tests are completely normal, you will receive your results only by: Marland Kitchen MyChart Message (if you have MyChart) OR . A paper copy in the mail If you have any lab test that is abnormal or we need to change your treatment, we will call you to review the results.  Testing/Procedures: Your physician has requested that you have a lexiscan myoview. For further information please visit [email or web address]. Please follow instruction sheet, as given.  Follow-Up: At [facility], you and your health needs are our priority.  As part of our continuing mission to provide you with exceptional heart care, we have created designated Provider Care Teams.  These Care Teams include your primary Cardiologist (physician) and Advanced Practice Providers (APPs -  Physician Assistants and Nurse Practitioners) who all work together to provide you with the care you need, when you need  it.  We recommend signing up for the patient portal called "MyChart".  Sign up information is provided on this After Visit Summary.  MyChart is used to connect with patients for Virtual Visits (Telemedicine).  Patients are able to view lab/test results, encounter notes, upcoming appointments, etc.  Non-urgent messages can be sent to your provider as well.   To learn more about what you can do with MyChart, go to [email or web address].    Your next appointment:   6 month(s)  The format for your next appointment:   In Person  Provider:   Candee Furbish, MD   Thank you for choosing [facility]!!        Signed, Candee Furbish, MD  10/17/2020 9:32 AM    [place]

## 2020-10-18 ENCOUNTER — Other Ambulatory Visit: Payer: Self-pay | Admitting: *Deleted

## 2020-10-18 DIAGNOSIS — I351 Nonrheumatic aortic (valve) insufficiency: Secondary | ICD-10-CM

## 2020-10-18 DIAGNOSIS — I480 Paroxysmal atrial fibrillation: Secondary | ICD-10-CM

## 2020-10-18 DIAGNOSIS — I428 Other cardiomyopathies: Secondary | ICD-10-CM

## 2020-10-18 MED ORDER — AMIODARONE HCL 200 MG PO TABS: 200.0000 mg | ORAL_TABLET | Freq: Every day | ORAL | 2 refills | Status: DC

## 2020-10-21 DIAGNOSIS — N1832 Chronic kidney disease, stage 3b: Secondary | ICD-10-CM | POA: Diagnosis not present

## 2020-10-23 ENCOUNTER — Telehealth (HOSPITAL_COMMUNITY): Payer: Self-pay | Admitting: *Deleted

## 2020-10-23 NOTE — Telephone Encounter (Signed)
Patient given detailed instructions per Myocardial Perfusion Study Information Sheet for the test on 10/25/20 at 7:45. Patient notified to arrive 15 minutes early and that it is imperative to arrive on time for appointment to keep from having the test rescheduled.  If you need to cancel or reschedule your appointment, please call the office within 24 hours of your appointment. . Patient verbalized understanding.Veronia Beets

## 2020-10-25 ENCOUNTER — Ambulatory Visit (HOSPITAL_COMMUNITY): Payer: Medicare Other | Attending: Cardiology

## 2020-10-25 ENCOUNTER — Other Ambulatory Visit: Payer: Self-pay

## 2020-10-25 DIAGNOSIS — I2510 Atherosclerotic heart disease of native coronary artery without angina pectoris: Secondary | ICD-10-CM | POA: Diagnosis not present

## 2020-10-25 LAB — MYOCARDIAL PERFUSION IMAGING
LV dias vol: 152 mL (ref 62–150)
LV sys vol: 73 mL
Peak HR: 67 {beats}/min
Rest HR: 50 {beats}/min
SDS: 2
SRS: 0
SSS: 2
TID: 1.05

## 2020-10-25 MED ORDER — TECHNETIUM TC 99M TETROFOSMIN IV KIT
32.5000 | PACK | Freq: Once | INTRAVENOUS | Status: AC | PRN
  Administered 2020-10-25: 32.5 via INTRAVENOUS
  Filled 2020-10-25: qty 33

## 2020-10-25 MED ORDER — TECHNETIUM TC 99M TETROFOSMIN IV KIT
10.1000 | PACK | Freq: Once | INTRAVENOUS | Status: AC | PRN
  Administered 2020-10-25: 10.1 via INTRAVENOUS
  Filled 2020-10-25: qty 11

## 2020-10-25 MED ORDER — REGADENOSON 0.4 MG/5ML IV SOLN
0.4000 mg | Freq: Once | INTRAVENOUS | Status: AC
  Administered 2020-10-25: 0.4 mg via INTRAVENOUS

## 2020-10-30 ENCOUNTER — Other Ambulatory Visit: Payer: Self-pay

## 2020-10-30 DIAGNOSIS — I480 Paroxysmal atrial fibrillation: Secondary | ICD-10-CM

## 2020-10-30 DIAGNOSIS — I351 Nonrheumatic aortic (valve) insufficiency: Secondary | ICD-10-CM

## 2020-10-30 DIAGNOSIS — I428 Other cardiomyopathies: Secondary | ICD-10-CM

## 2020-10-30 MED ORDER — AMIODARONE HCL 200 MG PO TABS: 200.0000 mg | ORAL_TABLET | Freq: Every day | ORAL | 3 refills | Status: DC

## 2020-10-30 NOTE — Telephone Encounter (Signed)
Pt's medication was sent to pt's pharmacy as requested. Confirmation received.  °

## 2020-11-06 DIAGNOSIS — N1832 Chronic kidney disease, stage 3b: Secondary | ICD-10-CM | POA: Diagnosis not present

## 2020-11-06 DIAGNOSIS — D631 Anemia in chronic kidney disease: Secondary | ICD-10-CM | POA: Diagnosis not present

## 2020-11-06 DIAGNOSIS — N189 Chronic kidney disease, unspecified: Secondary | ICD-10-CM | POA: Diagnosis not present

## 2020-11-06 DIAGNOSIS — N2581 Secondary hyperparathyroidism of renal origin: Secondary | ICD-10-CM | POA: Diagnosis not present

## 2020-11-06 DIAGNOSIS — I129 Hypertensive chronic kidney disease with stage 1 through stage 4 chronic kidney disease, or unspecified chronic kidney disease: Secondary | ICD-10-CM | POA: Diagnosis not present

## 2020-11-21 ENCOUNTER — Ambulatory Visit
Admission: RE | Admit: 2020-11-21 | Discharge: 2020-11-21 | Disposition: A | Discharge status: Home / Self Care | Payer: Medicare Other | Source: Ambulatory Visit | Attending: Cardiothoracic Surgery | Admitting: Cardiothoracic Surgery

## 2020-11-21 ENCOUNTER — Ambulatory Visit (INDEPENDENT_AMBULATORY_CARE_PROVIDER_SITE_OTHER): Payer: Medicare Other | Admitting: Cardiothoracic Surgery

## 2020-11-21 ENCOUNTER — Other Ambulatory Visit: Payer: Self-pay

## 2020-11-21 VITALS — BP 136/52 | HR 65 | Temp 97.7°F | Resp 20 | Ht 72.0 in | Wt 203.0 lb

## 2020-11-21 DIAGNOSIS — I712 Thoracic aortic aneurysm, without rupture, unspecified: Secondary | ICD-10-CM

## 2020-11-21 DIAGNOSIS — M2578 Osteophyte, vertebrae: Secondary | ICD-10-CM | POA: Diagnosis not present

## 2020-11-21 DIAGNOSIS — J8410 Pulmonary fibrosis, unspecified: Secondary | ICD-10-CM | POA: Diagnosis not present

## 2020-11-21 DIAGNOSIS — I2510 Atherosclerotic heart disease of native coronary artery without angina pectoris: Secondary | ICD-10-CM | POA: Diagnosis not present

## 2020-11-28 ENCOUNTER — Other Ambulatory Visit: Payer: Self-pay

## 2020-11-28 ENCOUNTER — Emergency Department (HOSPITAL_COMMUNITY)
Admission: EM | Admit: 2020-11-28 | Discharge: 2020-11-28 | Disposition: A | Discharge status: Home / Self Care | Payer: No Typology Code available for payment source | Attending: Emergency Medicine | Admitting: Emergency Medicine

## 2020-11-28 ENCOUNTER — Encounter (HOSPITAL_COMMUNITY): Payer: Self-pay | Admitting: *Deleted

## 2020-11-28 DIAGNOSIS — X58XXXA Exposure to other specified factors, initial encounter: Secondary | ICD-10-CM | POA: Insufficient documentation

## 2020-11-28 DIAGNOSIS — Z4502 Encounter for adjustment and management of automatic implantable cardiac defibrillator: Secondary | ICD-10-CM | POA: Insufficient documentation

## 2020-11-28 DIAGNOSIS — Z87891 Personal history of nicotine dependence: Secondary | ICD-10-CM | POA: Diagnosis not present

## 2020-11-28 DIAGNOSIS — Z85038 Personal history of other malignant neoplasm of large intestine: Secondary | ICD-10-CM | POA: Insufficient documentation

## 2020-11-28 DIAGNOSIS — N183 Chronic kidney disease, stage 3 unspecified: Secondary | ICD-10-CM | POA: Insufficient documentation

## 2020-11-28 DIAGNOSIS — T1501XA Foreign body in cornea, right eye, initial encounter: Secondary | ICD-10-CM | POA: Insufficient documentation

## 2020-11-28 DIAGNOSIS — Z85828 Personal history of other malignant neoplasm of skin: Secondary | ICD-10-CM | POA: Diagnosis not present

## 2020-11-28 DIAGNOSIS — T1591XA Foreign body on external eye, part unspecified, right eye, initial encounter: Secondary | ICD-10-CM

## 2020-11-28 DIAGNOSIS — I129 Hypertensive chronic kidney disease with stage 1 through stage 4 chronic kidney disease, or unspecified chronic kidney disease: Secondary | ICD-10-CM | POA: Diagnosis not present

## 2020-11-28 MED ORDER — NEOMYCIN-POLYMYXIN-DEXAMETH 3.5-10000-0.1 OP SUSP
2.0000 [drp] | OPHTHALMIC | Status: DC
  Administered 2020-11-28: 2 [drp] via OPHTHALMIC
  Filled 2020-11-28: qty 5

## 2020-11-28 MED ORDER — TETRACAINE HCL 0.5 % OP SOLN
2.0000 [drp] | Freq: Once | OPHTHALMIC | Status: AC
  Administered 2020-11-28: 2 [drp] via OPHTHALMIC
  Filled 2020-11-28: qty 4

## 2020-11-28 MED ORDER — FLUORESCEIN SODIUM 1 MG OP STRP
1.0000 | ORAL_STRIP | Freq: Once | OPHTHALMIC | Status: AC
  Administered 2020-11-28: 1 via OPHTHALMIC
  Filled 2020-11-28: qty 1

## 2020-11-28 NOTE — ED Notes (Signed)
Morgan lens placed in right eye. Right eye irrigated with LR. Pt tolerated well.

## 2020-11-28 NOTE — Discharge Instructions (Signed)
Please place 2 drops in your right eye every 4 hours while awake for 5 days. Please call the eye doctor to set up a follow-up appointment.  The 29th would be best however if you are unable to be seen until Monday that is okay.  If you develop any worsening symptoms or have any new concerns please return to the emergency room sooner.

## 2020-11-28 NOTE — ED Notes (Signed)
No computer available to sign MSE at triage

## 2020-11-28 NOTE — ED Provider Notes (Signed)
[facility] EMERGENCY DEPARTMENT Provider Note   CSN: [id number] Arrival date & time: 11/28/20  1624     History Chief Complaint  Patient presents with  . Eye Problem    Derek [name] is a 80 y.o. male with a past medical history of thoracic aortic aneurysm, A. fib anticoagulated, colon cancer, CKD, cataracts who presents today for evaluation of eye issue. He was using minwax polycyrlic  clear to finish a table he had built and got some in his eye.  No pain or vision change.  He irrigated his eye for about 10 minutes after.  He denies any other concerns.  His right eye, that he got the minwax into, is his "good eye."  He notes due to prior injury his pupils are uneven at baseline.  HPI     Past Medical History:  Diagnosis Date  . Allergy   . Anemia   . Anxiety   . Aortic insufficiency    a. mild-mod by echo 09/2015.  . Arthritis    "knees; left shoulder" (09/21/2013)  . Ascending aortic aneurysm (HCC)    a. last measurement 5.3 cm 03/2016 -> f/u planned 09/2015 to continue to follow.  . Atrial fibrillation ([place])   . B12 deficiency    takes Vit 12 shot every 14days   . Bowel perforation ([place]) 08/27/2016  . Cataract   . CKD (chronic kidney disease) stage 3, GFR 30-59 ml/min (HCC) 08/22/2011  . Clotting disorder ([place])   . Colon cancer ([place])   . Colonic ischemia ([place]) 08/27/2016  . Crohn's disease ([place])   . Depression   . Enlarged prostate   . Enteric hyperoxaluria 02/21/2016  . GERD (gastroesophageal reflux disease)    takes Omeprazole daily  . Gout    takes Uloric and Colchicine daily  . Heart murmur   . Hepatitis C 1978   negtive RNA load - spontaneously cleared  . Hiatal hernia   . High output ileostomy ([place]) 06/02/2017  . History of blood transfusion 1978; 1990's; ?   "w/bowel resection; S/P allupurinol; ?" (09/21/2013)  . History of colon polyps   . History of kidney stones   . History of MRSA infection 2010  . History of pulmonary embolism 2006   both legs  and both lungs /notes 08/26/2008 (09/21/2013)  . History of small bowel obstruction   . History of staph infection 1978  . Hyperoxaluria    Intestinal  . Hypertension   . Insomnia    takes Trazodone nightly  . Internal hemorrhoids   . LV dysfunction    a. h/o EF 45-50% in 2015, normalized on subsequent echoes.  . Nephrolithiasis   . Nocardia infection   . Pancreatitis 2010   elevated lipase and amylase, stranding in tail of pancreas, ? from Humira  . Pancytopenia    Hx of  . Peripheral neuropathy    takes Gabapentin daily  . Pneumonia    hx of   . Post-traumatic stress syndrome    takes Paxil nightly  . PTSD (post-traumatic stress disorder)   . Pulmonary nodule    a. 59m by CT 05/2015, recommended f/u 6-12 months.  . RLS (restless legs syndrome)   . Rosacea conjunctivitis(372.31)    takes Minocin daily  . Secondary hyperparathyroidism ([place] 02/21/2016  . Sinus bradycardia   . Skin cancer    "cut/burned off left ear and face" (09/21/2013)  . Small bowel obstruction ([place]   . Status post reversal of ileostomy 07/23/2017  . Thrombocytopenia (  [place])    hx of    Patient Active Problem List   Diagnosis Date Noted  . Personal history of colon cancer 08/14/2019  . Elevated LFTs 03/12/2019  . Osteoarthritis of left knee 08/23/2018  . History of gout 08/23/2018  . Thoracic aortic aneurysm without rupture ([place]) 10/26/2017  . Malignant neoplasm of transverse colon ([place])   . Partial small bowel obstruction ([place]) 05/09/2017  . Aortic valve regurgitation 11/05/2016  . Chronic anticoagulation 08/18/2016  . Sinus bradycardia 08/17/2016  . S/P colon resection 08/14/2016  . Adenomatous polyp of transverse colon s/p partial colectomy 08/14/2016 08/14/2016  . Essential hypertension 05/27/2016  . Peripheral neuropathic pain 05/26/2016  . GERD (gastroesophageal reflux disease) 05/26/2016  . Paroxysmal atrial fibrillation ([place]) 05/26/2016  . Lung nodule 05/12/2016  . SBO (small bowel  obstruction) ([place]) 05/11/2016  . Enteric hyperoxaluria 02/21/2016  . Secondary hyperparathyroidism ([place]) 02/21/2016  . Pain in joint, shoulder region 10/12/2013  . Decreased range of motion of left shoulder 10/12/2013  . Muscle weakness (generalized) 10/12/2013  . Restriction of joint motion 10/12/2013  . S/P shoulder replacement 09/21/2013  . Nonischemic cardiomyopathy ([place]) 09/07/2013  . Anxiety 08/22/2011  . CKD (chronic kidney disease) stage 3, GFR 30-59 ml/min (HCC) 08/22/2011  . Thrombocytopenia ([place]) 08/22/2011  . CORONARY ATHEROSCLEROSIS NATIVE CORONARY ARTERY 06/25/2010  . Ascending aortic aneurysm ([place]) 06/26/2009  . Crohn's ileocolitis ([place]) 06/26/2009  . B12 DEFICIENCY 05/09/2008  . Gout 05/09/2008  . POST TRAUMATIC STRESS SYNDROME 05/09/2008  . GERD 05/09/2008  . HEPATITIS C Ab positive RNA neg, HX OF 05/09/2008  . PULMONARY EMBOLISM, HX OF 05/09/2008  . History of small bowel obstruction 05/09/2008    Past Surgical History:  Procedure Laterality Date  . ANKLE SURGERY Right   . APPENDECTOMY  1978  . BOWEL RESECTION  1978 X 2  . CARDIOVERSION N/A 05/29/2016   Procedure: CARDIOVERSION;  Surgeon: Sanda Klein, MD;  Location: [facility];  Service: Cardiovascular;  Laterality: N/A;  . CHOLECYSTECTOMY    . Colon Cancer    . COLON RESECTION N/A 08/14/2016   Procedure: LAPAROSCOPIC RESECTION TRANSVERSE COLON;  Surgeon: [name], MD;  Location: [facility];  Service: General;  Laterality: N/A;  . COLON SURGERY    . COLONOSCOPY    . ESOPHAGOGASTRODUODENOSCOPY    . EYE SURGERY     cataract surgery bilateral  . FOOT SURGERY Right    "took gout out"  . HEMICOLECTOMY Right   . ILEOCECETOMY  1978   [name] 05/10/2000  (09/21/2013)  . ILEOSTOMY    . ILEOSTOMY CLOSURE N/A 07/23/2017   Procedure: ILEOSTOMY REVERSAL ;  Surgeon: [name], MD;  Location: [facility];  Service: General;  Laterality: N/A;  . INGUINAL HERNIA REPAIR Right   . IR FLUORO GUIDE CV LINE RIGHT   05/07/2017  . IR REMOVAL TUN CV CATH W/O FL  10/08/2017  . IR US GUIDE VASC ACCESS RIGHT  05/07/2017  . KNEE ARTHROSCOPY Left   . LAPAROTOMY N/A 08/27/2016   Procedure: EXPLORATORYLAPAROTOMY, LYSIS OF ADHESIONS, ILEOSTOMY, RIGHT COLECTOMY;  Surgeon: [name], MD;  Location: [facility];  Service: General;  Laterality: N/A;  . LIGAMENT REPAIR Left   . POLYPECTOMY    . TEE WITHOUT CARDIOVERSION N/A 05/29/2016   Procedure: TRANSESOPHAGEAL ECHOCARDIOGRAM (TEE);  Surgeon: Sanda Klein, MD;  Location: [facility];  Service: Cardiovascular;  Laterality: N/A;  . TOTAL SHOULDER ARTHROPLASTY Left 09/21/2013  . TOTAL SHOULDER ARTHROPLASTY Left 09/21/2013   Procedure: LEFT TOTAL SHOULDER ARTHROPLASTY;  Surgeon: Marin Shutter, MD;  Location: [facility];  Service: Orthopedics;  Laterality: Left;       Family History  Problem Relation Age of Onset  . Kidney disease Father   . Hypertension Father   . Aneurysm Mother   . Aneurysm Sister   . Esophageal cancer Neg Hx   . Stomach cancer Neg Hx   . Rectal cancer Neg Hx   . Colon cancer Neg Hx     Social History   Tobacco Use  . Smoking status: Former Smoker    Packs/day: 2.00    Years: 20.00    Pack years: 40.00    Types: Cigarettes    Quit date: 08/04/1975    Years since quitting: 45.3  . Smokeless tobacco: Former Systems developer    Types: Chew    Quit date: 08/03/1978  . Tobacco comment: 09/21/2013 "quit smoking in the late 1970's; stopped chewing couple years after I quit smoking"  Vaping Use  . Vaping Use: Never used  Substance Use Topics  . Alcohol use: No  . Drug use: No    Home Medications Prior to Admission medications   Medication Sig Start Date End Date Taking? Authorizing Provider  Acidophilus Lactobacillus CAPS Take 1 capsule by mouth daily. 06/08/16   [name], MD  amiodarone (PACERONE) 200 MG tablet Take 1 tablet (200 mg total) by mouth daily. 10/30/20   Jerline Pain, MD  calcium carbonate (TUMS - DOSED IN MG ELEMENTAL CALCIUM) 500 MG  chewable tablet Chew 2 tablets by mouth 2 (two) times daily.    [provider]  Cholecalciferol (VITAMIN D) 2000 units CAPS Take 2,000 Units by mouth daily.    [provider]  cyanocobalamin (,VITAMIN B-12,) 1000 MCG/ML injection Inject 1,000 mcg into the muscle. THREE TIMES A MONTH    [provider]  ELIQUIS 5 MG TABS tablet Take 1 tablet (5 mg total) by mouth 2 (two) times daily. 11/05/16   End, Harrell Gave, MD  famotidine (PEPCID) 20 MG tablet Take 20 mg by mouth daily.    [provider]  febuxostat (ULORIC) 40 MG tablet Take 40 mg by mouth daily.    [provider]  ferrous sulfate 325 (65 FE) MG tablet Take 325 mg by mouth daily with breakfast.    [provider]  gabapentin (NEURONTIN) 100 MG capsule Take one (1) capsules (100 mg) by mouth each morning and 373m at bedtime.    [provider]  HYDROcodone-acetaminophen (NORCO) 10-325 MG tablet Take 1-2 tablets by mouth 4 (four) times daily as needed. 12/05/19   [provider]  levothyroxine (SYNTHROID) 50 MCG tablet Take 50 mcg by mouth daily before breakfast.    [provider]  lidocaine (LIDODERM) 5 % Place 1 patch onto the skin daily. Remove & Discard patch within 12 hours or as directed by MD 04/02/20   HMargarita Mail PA-C  magnesium oxide (MAG-OX) 400 (241.3 Mg) MG tablet Take 1,200 mg by mouth 2 (two) times daily.     [provider]  Multiple Vitamin (MULTIVITAMIN WITH MINERALS) TABS tablet Take 1 tablet by mouth daily.    [provider]  oxyCODONE (ROXICODONE) 5 MG immediate release tablet Take 0.5-1 tablets (2.5-5 mg total) by mouth every 6 (six) hours as needed for severe pain. 04/04/20   GGrace Isaac MD  PARoxetine (PAXIL) 20 MG tablet Take 20 mg by mouth daily.     [provider]  rosuvastatin (CRESTOR) 10 MG tablet  Take 1 tablet (10 mg total) by mouth daily. 10/17/20   Jerline Pain, MD  sodium bicarbonate 650 MG  tablet Take 1,300 mg by mouth 2 (two) times daily.     [provider]  traZODone (DESYREL) 100 MG tablet Take 100 mg by mouth at bedtime.    [provider]  Calcium Carbonate (CALCIUM 500 PO) Take 2 tablets by mouth 2 (two) times daily.   05/11/16  [provider]    Allergies    Lorazepam, Humira [adalimumab], Calcium d-glucarate [calcium saccharate], and Quinolones  Review of Systems   Review of Systems  Constitutional: Negative for chills and fever.  Eyes: Positive for redness and itching. Negative for photophobia, pain and visual disturbance.  Respiratory: Negative for chest tightness.   Neurological: Negative for weakness and headaches.  All other systems reviewed and are negative.   Physical Exam Updated Vital Signs BP (!) 140/58   Pulse 68   Temp 98.5 F (36.9 C) (Oral)   Resp 20   Ht 6' (1.829 m)   Wt 90.7 kg   SpO2 98%   BMI 27.12 kg/m   Physical Exam Vitals and nursing note reviewed.  Constitutional:      General: He is not in acute distress.    Appearance: He is not ill-appearing.  HENT:     Head: Normocephalic and atraumatic.  Eyes:     General: Lids are normal.     Comments: Right conjunctive is mildly injected.  There is no fluorescein uptake in the right eye. On the temporal side when viewed with slit-lamp there are multiple surface irregularities that are extremely reflective concerning for foreign body.  Cardiovascular:     Rate and Rhythm: Normal rate.  Pulmonary:     Effort: Pulmonary effort is normal. No respiratory distress.  Musculoskeletal:     Cervical back: No rigidity.  Neurological:     Mental Status: He is alert. Mental status is at baseline.     Comments: Awake and alert, answers all questions appropriately.  Speech is not slurred.    Psychiatric:        Mood and Affect: Mood normal.      Visual Acuity  Right Eye Distance: 20/20 Left Eye Distance: 20/20 Bilateral Distance: 20/20  Right Eye Near: R  Near: 20/20 Left Eye Near:  L Near: 20/20 Bilateral Near:  20/20     ED Results / Procedures / Treatments   Labs (all labs ordered are listed, but only abnormal results are displayed) Labs Reviewed - No data to display  EKG None  Radiology No results found.  Procedures Irrigation  Date/Time: 11/28/2020 7:22 PM Performed by: Lorin Glass, PA-C Authorized by: Lorin Glass, PA-C  Consent: Verbal consent obtained. Risks and benefits: risks, benefits and alternatives were discussed Consent given by: patient Local anesthesia used: yes Anesthesia: see MAR for details  Anesthesia: Local anesthesia used: yes  Sedation: Patient sedated: no  Patient tolerance: patient tolerated the procedure well with no immediate complications Comments: Lilia Pro lens was used to irrigate 500 cc of LR for the right eye.      Medications Ordered in ED Medications  neomycin-polymyxin b-dexamethasone (MAXITROL) ophthalmic suspension 2 drop (2 drops Right Eye Given 11/28/20 1844)  tetracaine (PONTOCAINE) 0.5 % ophthalmic solution 2 drop (2 drops Both Eyes Given 11/28/20 1659)  fluorescein ophthalmic strip 1 strip (1 strip Both Eyes Given 11/28/20 1659)    ED Course  I have reviewed the triage vital signs and  the nursing notes.  Pertinent labs & imaging results that were available during my care of the patient were reviewed by me and considered in my medical decision making (see chart for details).  Clinical Course as of 11/28/20 1921  Thu Nov 28, 2020  1731 I spoke with Dr. Alanda Slim of ophthalmology.  Recommended using a Morgan lens to irrigate with 500 cc of fluid into the eye followed by Polydex eyedrops.  He needs to be seen in the office tomorrow or with in the next week.  [EH]    Clinical Course User Index [EH] Ollen Gross   MDM Rules/Calculators/A&P                         Patient is a 80 year old man who presents today for evaluation after he got Minwax  polycrylic in his right eye shortly prior to arrival.  His vision is intact and he denies significant change in his vision or pain in the eye. He states his last tetanus shot was within the past 5 years. On slit-lamp exam there is no fluorescein uptake however there are multiple highly reflective areas that appear to distort the contour of the eye and appear consistent with the foreign body.   Spoke with on-call ophthalmology Dr. Alanda Slim who recommended using Lilia Pro lens to irrigate 500 cc of fluid in the eye and starting him on polymyxin dexamethasone eyedrops, specifically including the steroid. He recommended office follow-up tomorrow or as soon as patient is able to. This recommendation is discussed with patient who consented for irrigation.  He was given his eyedrops while in the emergency room and given written instructions on how to use them and when to use them.  Return precautions were discussed with patient who states their understanding.  At the time of discharge patient denied any unaddressed complaints or concerns.  Patient is agreeable for discharge home.  Note: Portions of this report may have been transcribed using voice recognition software. Every effort was made to ensure accuracy; however, inadvertent computerized transcription errors may be present  Final Clinical Impression(s) / ED Diagnoses Final diagnoses:  Foreign body of right eye, initial encounter    Rx / DC Orders ED Discharge Orders    None       Ollen Gross 11/28/20 1925    Milton Ferguson, MD 11/30/20 1438

## 2020-11-28 NOTE — Progress Notes (Signed)
[address]       [place] [address]             [phone]     CARDIOTHORACIC SURGERY OFFICE NOTE  Referring Provider is Larey Dresser, MD Primary Cardiologist is Candee Furbish, MD PCP is Sharilyn Sites, MD   HPI:  80 year old man has been previously followed by Dr. Servando Snare for dilation of the ascending aorta.  This has been stable over several years.  The patient denies any chest pain or shortness of breath recently.  His blood pressure is reasonably well controlled.  He continues to have chronic stable mild renal insufficiency   Current Outpatient Medications  Medication Sig Dispense Refill  . Acidophilus Lactobacillus CAPS Take 1 capsule by mouth daily. 90 capsule 3  . amiodarone (PACERONE) 200 MG tablet Take 1 tablet (200 mg total) by mouth daily. 90 tablet 3  . calcium carbonate (TUMS - DOSED IN MG ELEMENTAL CALCIUM) 500 MG chewable tablet Chew 2 tablets by mouth 2 (two) times daily.    . Cholecalciferol (VITAMIN D) 2000 units CAPS Take 2,000 Units by mouth daily.    . cyanocobalamin (,VITAMIN B-12,) 1000 MCG/ML injection Inject 1,000 mcg into the muscle. THREE TIMES A MONTH    . ELIQUIS 5 MG TABS tablet Take 1 tablet (5 mg total) by mouth 2 (two) times daily. 180 tablet 3  . famotidine (PEPCID) 20 MG tablet Take 20 mg by mouth daily.    . febuxostat (ULORIC) 40 MG tablet Take 40 mg by mouth daily.    . ferrous sulfate 325 (65 FE) MG tablet Take 325 mg by mouth daily with breakfast.    . gabapentin (NEURONTIN) 100 MG capsule Take one (1) capsules (100 mg) by mouth each morning and 384m at bedtime.    .Marland KitchenHYDROcodone-acetaminophen (NORCO) 10-325 MG tablet Take 1-2 tablets by mouth 4 (four) times daily as needed.    .Marland Kitchenlevothyroxine (SYNTHROID) 50 MCG tablet Take 50 mcg by mouth daily before breakfast.    . lidocaine (LIDODERM) 5 % Place 1 patch onto the skin daily. Remove & Discard patch within 12 hours or as directed by MD 30 patch 0  . magnesium oxide  (MAG-OX) 400 (241.3 Mg) MG tablet Take 1,200 mg by mouth 2 (two) times daily.     . Multiple Vitamin (MULTIVITAMIN WITH MINERALS) TABS tablet Take 1 tablet by mouth daily.    .Marland KitchenoxyCODONE (ROXICODONE) 5 MG immediate release tablet Take 0.5-1 tablets (2.5-5 mg total) by mouth every 6 (six) hours as needed for severe pain. 12 tablet 0  . PARoxetine (PAXIL) 20 MG tablet Take 20 mg by mouth daily.     . rosuvastatin (CRESTOR) 10 MG tablet Take 1 tablet (10 mg total) by mouth daily. 90 tablet 3  . sodium bicarbonate 650 MG tablet Take 1,300 mg by mouth 2 (two) times daily.     . traZODone (DESYREL) 100 MG tablet Take 100 mg by mouth at bedtime.     No current facility-administered medications for this visit.      Physical Exam:   BP (!) 136/52   Pulse 65   Temp 97.7 F (36.5 C) (Skin)   Resp 20   Ht 6' (1.829 m)   Wt 92.1 kg   SpO2 98% Comment: RA  BMI 27.53 kg/m   General:  Well-appearing no acute distress  Chest:   Clear to auscultation  CV:   Irregularly irregular  Neuro:  Grossly intact  Extremities:  Warm and well-perfused  Diagnostic Tests:  I have personally reviewed his available imaging studies Including chest CT from 11/21/20 and agree with their interpretation is a sub-5 cm ascending aortic aneurysm which is stable   Impression:  80 year old man with stable ascending aortic aneurysm  Plan:  Follow-up in 1 year with repeat CT scan Report any back pain or chest pain to local emergency department  I spent in excess of 20 minutes during the conduct of this office consultation and >50% of this time involved direct face-to-face encounter with the patient for counseling and/or coordination of their care.  Level 2                 10 minutes Level 3                 15 minutes Level 4                 25 minutes Level 5                 40 minutes  B.  Murvin Natal, MD 11/28/2020 1:38 PM

## 2020-11-28 NOTE — ED Triage Notes (Signed)
Patient was working with Polycrylic and got some drops in his right eye, denies pain

## 2020-11-29 DIAGNOSIS — H10211 Acute toxic conjunctivitis, right eye: Secondary | ICD-10-CM | POA: Diagnosis not present

## 2020-11-29 DIAGNOSIS — H0102B Squamous blepharitis left eye, upper and lower eyelids: Secondary | ICD-10-CM | POA: Diagnosis not present

## 2020-11-29 DIAGNOSIS — L719 Rosacea, unspecified: Secondary | ICD-10-CM | POA: Diagnosis not present

## 2020-11-29 DIAGNOSIS — H0102A Squamous blepharitis right eye, upper and lower eyelids: Secondary | ICD-10-CM | POA: Diagnosis not present

## 2020-11-29 DIAGNOSIS — Z961 Presence of intraocular lens: Secondary | ICD-10-CM | POA: Diagnosis not present

## 2020-11-29 DIAGNOSIS — H1011 Acute atopic conjunctivitis, right eye: Secondary | ICD-10-CM | POA: Diagnosis not present

## 2020-11-29 DIAGNOSIS — H10413 Chronic giant papillary conjunctivitis, bilateral: Secondary | ICD-10-CM | POA: Diagnosis not present

## 2020-12-16 DIAGNOSIS — I8393 Asymptomatic varicose veins of bilateral lower extremities: Secondary | ICD-10-CM | POA: Diagnosis not present

## 2020-12-16 DIAGNOSIS — L814 Other melanin hyperpigmentation: Secondary | ICD-10-CM | POA: Diagnosis not present

## 2020-12-16 DIAGNOSIS — L819 Disorder of pigmentation, unspecified: Secondary | ICD-10-CM | POA: Diagnosis not present

## 2020-12-16 DIAGNOSIS — Z872 Personal history of diseases of the skin and subcutaneous tissue: Secondary | ICD-10-CM | POA: Diagnosis not present

## 2020-12-16 DIAGNOSIS — L821 Other seborrheic keratosis: Secondary | ICD-10-CM | POA: Diagnosis not present

## 2020-12-16 DIAGNOSIS — L905 Scar conditions and fibrosis of skin: Secondary | ICD-10-CM | POA: Diagnosis not present

## 2020-12-16 DIAGNOSIS — L718 Other rosacea: Secondary | ICD-10-CM | POA: Diagnosis not present

## 2020-12-20 ENCOUNTER — Ambulatory Visit: Payer: Medicare Other | Admitting: Internal Medicine

## 2021-01-15 ENCOUNTER — Other Ambulatory Visit: Payer: Self-pay

## 2021-01-15 ENCOUNTER — Other Ambulatory Visit: Payer: Medicare Other | Admitting: *Deleted

## 2021-01-15 DIAGNOSIS — I2510 Atherosclerotic heart disease of native coronary artery without angina pectoris: Secondary | ICD-10-CM

## 2021-01-15 DIAGNOSIS — Z79899 Other long term (current) drug therapy: Secondary | ICD-10-CM

## 2021-01-15 LAB — LIPID PANEL
Chol/HDL Ratio: 2.4 ratio (ref 0.0–5.0)
Cholesterol, Total: 97 mg/dL — ABNORMAL LOW (ref 100–199)
HDL: 40 mg/dL (ref >39)
LDL Chol Calc (NIH): 25 mg/dL (ref 0–99)
Triglycerides: 203 mg/dL — ABNORMAL HIGH (ref 0–149)
VLDL Cholesterol Cal: 32 mg/dL (ref 5–40)

## 2021-01-15 LAB — ALT: ALT: 46 IU/L — ABNORMAL HIGH (ref 0–44)

## 2021-02-07 DIAGNOSIS — M79671 Pain in right foot: Secondary | ICD-10-CM | POA: Diagnosis not present

## 2021-02-07 DIAGNOSIS — M109 Gout, unspecified: Secondary | ICD-10-CM | POA: Diagnosis not present

## 2021-02-07 DIAGNOSIS — Z20822 Contact with and (suspected) exposure to covid-19: Secondary | ICD-10-CM | POA: Diagnosis not present

## 2021-02-07 DIAGNOSIS — M79672 Pain in left foot: Secondary | ICD-10-CM | POA: Diagnosis not present

## 2021-02-10 ENCOUNTER — Other Ambulatory Visit: Payer: Self-pay | Admitting: Podiatry

## 2021-02-10 ENCOUNTER — Other Ambulatory Visit (HOSPITAL_COMMUNITY): Payer: Self-pay | Admitting: Podiatry

## 2021-02-10 ENCOUNTER — Other Ambulatory Visit (HOSPITAL_COMMUNITY): Payer: Self-pay | Admitting: Family Medicine

## 2021-02-10 DIAGNOSIS — Z01818 Encounter for other preprocedural examination: Secondary | ICD-10-CM

## 2021-02-14 ENCOUNTER — Telehealth: Payer: Self-pay | Admitting: Internal Medicine

## 2021-02-14 NOTE — Telephone Encounter (Signed)
Spoke with the spouse. She agrees to this plan of care and will call the PCP now.

## 2021-02-14 NOTE — Telephone Encounter (Signed)
Pt's wife called stating that pt's stomach is very distented, red, and tender to the touch. Pt has an appt on 8/1 with Dr. Carlean Purl but is requesting a sooner appt.

## 2021-02-14 NOTE — Telephone Encounter (Signed)
The redness of the skin and tenderness there is concerning for a skin infection or cellulitis  He should get evaluation today whether it is urgent care or an emergency department or primary care to take a look at that  I do not think he necessarily needs to go to an emergency department but somebody should take a look at this today and see what is going on before it gets worse

## 2021-02-14 NOTE — Telephone Encounter (Signed)
Returned wife's call and she states for the past 3 days her husband has had redness, distention and tender to the touch on the right side of his abdomin. Denies fever, emesis, pain,  or change in bowel habits. Is having some nausea. He has an office visit scheduled with Dr. Carlean Purl on 03/03/21. There are no earlier openings with any of the apps. Please advise.

## 2021-02-17 ENCOUNTER — Ambulatory Visit (HOSPITAL_COMMUNITY)
Admission: RE | Admit: 2021-02-17 | Discharge: 2021-02-17 | Disposition: A | Discharge status: Home / Self Care | Payer: Medicare Other | Source: Ambulatory Visit | Attending: Podiatry | Admitting: Podiatry

## 2021-02-17 ENCOUNTER — Other Ambulatory Visit: Payer: Self-pay | Admitting: Family Medicine

## 2021-02-17 ENCOUNTER — Other Ambulatory Visit: Payer: Self-pay

## 2021-02-17 ENCOUNTER — Ambulatory Visit (HOSPITAL_COMMUNITY)
Admission: RE | Admit: 2021-02-17 | Discharge: 2021-02-17 | Disposition: A | Discharge status: Home / Self Care | Payer: Medicare Other | Source: Ambulatory Visit | Attending: Family Medicine | Admitting: Family Medicine

## 2021-02-17 DIAGNOSIS — I700 Atherosclerosis of aorta: Secondary | ICD-10-CM | POA: Diagnosis not present

## 2021-02-17 DIAGNOSIS — Z01818 Encounter for other preprocedural examination: Secondary | ICD-10-CM

## 2021-02-17 DIAGNOSIS — Z9049 Acquired absence of other specified parts of digestive tract: Secondary | ICD-10-CM | POA: Diagnosis not present

## 2021-02-17 DIAGNOSIS — K56609 Unspecified intestinal obstruction, unspecified as to partial versus complete obstruction: Secondary | ICD-10-CM | POA: Diagnosis not present

## 2021-02-17 DIAGNOSIS — E663 Overweight: Secondary | ICD-10-CM | POA: Diagnosis not present

## 2021-02-17 DIAGNOSIS — K5180 Other ulcerative colitis without complications: Secondary | ICD-10-CM | POA: Diagnosis not present

## 2021-02-17 DIAGNOSIS — Z6828 Body mass index (BMI) 28.0-28.9, adult: Secondary | ICD-10-CM | POA: Diagnosis not present

## 2021-02-17 DIAGNOSIS — I77810 Thoracic aortic ectasia: Secondary | ICD-10-CM | POA: Diagnosis not present

## 2021-02-17 DIAGNOSIS — N200 Calculus of kidney: Secondary | ICD-10-CM | POA: Diagnosis not present

## 2021-02-17 DIAGNOSIS — N184 Chronic kidney disease, stage 4 (severe): Secondary | ICD-10-CM | POA: Diagnosis not present

## 2021-02-17 DIAGNOSIS — R109 Unspecified abdominal pain: Secondary | ICD-10-CM

## 2021-02-18 DIAGNOSIS — M109 Gout, unspecified: Secondary | ICD-10-CM | POA: Diagnosis not present

## 2021-02-18 DIAGNOSIS — M79671 Pain in right foot: Secondary | ICD-10-CM | POA: Diagnosis not present

## 2021-02-18 DIAGNOSIS — M79672 Pain in left foot: Secondary | ICD-10-CM | POA: Diagnosis not present

## 2021-02-19 ENCOUNTER — Telehealth: Payer: Self-pay | Admitting: Internal Medicine

## 2021-02-19 NOTE — Telephone Encounter (Signed)
Patient calling wants to discuss CT results (02/17/21).. Plz advise  thanks

## 2021-02-20 ENCOUNTER — Ambulatory Visit: Payer: Medicare Other | Admitting: Internal Medicine

## 2021-02-21 NOTE — Telephone Encounter (Signed)
Please see if can be seen at [facility] for this (assuming he is still having problems)  Let me know if [facility] can see him today as a work in based upon CT  I can Rx some Abx in the meantime and will depending upon what we can find out re; appointment with surgeons

## 2021-02-24 DIAGNOSIS — L989 Disorder of the skin and subcutaneous tissue, unspecified: Secondary | ICD-10-CM | POA: Diagnosis not present

## 2021-02-24 DIAGNOSIS — B029 Zoster without complications: Secondary | ICD-10-CM | POA: Diagnosis not present

## 2021-02-25 DIAGNOSIS — B0239 Other herpes zoster eye disease: Secondary | ICD-10-CM | POA: Diagnosis not present

## 2021-02-27 DIAGNOSIS — B019 Varicella without complication: Secondary | ICD-10-CM | POA: Diagnosis not present

## 2021-02-27 DIAGNOSIS — B029 Zoster without complications: Secondary | ICD-10-CM | POA: Diagnosis not present

## 2021-03-03 ENCOUNTER — Ambulatory Visit (INDEPENDENT_AMBULATORY_CARE_PROVIDER_SITE_OTHER): Payer: Medicare Other | Admitting: Internal Medicine

## 2021-03-03 ENCOUNTER — Telehealth: Payer: Self-pay

## 2021-03-03 ENCOUNTER — Encounter: Payer: Self-pay | Admitting: Internal Medicine

## 2021-03-03 VITALS — BP 130/70 | HR 65 | Ht 72.0 in | Wt 206.0 lb

## 2021-03-03 DIAGNOSIS — Z85038 Personal history of other malignant neoplasm of large intestine: Secondary | ICD-10-CM | POA: Diagnosis not present

## 2021-03-03 DIAGNOSIS — M7989 Other specified soft tissue disorders: Secondary | ICD-10-CM | POA: Diagnosis not present

## 2021-03-03 DIAGNOSIS — I2510 Atherosclerotic heart disease of native coronary artery without angina pectoris: Secondary | ICD-10-CM | POA: Diagnosis not present

## 2021-03-03 DIAGNOSIS — K5080 Crohn's disease of both small and large intestine without complications: Secondary | ICD-10-CM | POA: Diagnosis not present

## 2021-03-03 DIAGNOSIS — K219 Gastro-esophageal reflux disease without esophagitis: Secondary | ICD-10-CM | POA: Diagnosis not present

## 2021-03-03 DIAGNOSIS — Z7901 Long term (current) use of anticoagulants: Secondary | ICD-10-CM

## 2021-03-03 MED ORDER — SUCRALFATE 1 GM/10ML PO SUSP: 1.0000 g | Freq: Four times a day (QID) | ORAL | 11 refills | Status: DC | PRN

## 2021-03-03 NOTE — Patient Instructions (Addendum)
You have been scheduled for a colonoscopy. Please follow written instructions given to you at your visit today.  Please pick up your prep supplies at the pharmacy within the next 1-3 days. If you use inhalers (even only as needed), please bring them with you on the day of your procedure.  You will be contaced by our office prior to your procedure for directions on holding your Eliquis. If you do not hear from our office 1 week prior to your scheduled procedure, please call [phone] to discuss. Ask for PJ   We have sent the following medications to your [facility] for you to pick up at your convenience: Carafate   I appreciate the opportunity to care for you. Silvano Rusk, MD, [facility]

## 2021-03-03 NOTE — Telephone Encounter (Signed)
[facility] Pre-operative Risk Assessment     Request for surgical clearance:     Endoscopy Procedure  What type of surgery is being performed?     colonoscopy  When is this surgery scheduled?     05/22/21  What type of clearance is required ?   Pharmacy  Are there any medications that need to be held prior to surgery and how long? Eliquis, 2 days  Practice name and name of physician performing surgery?      [facility]  What is your office phone and fax number?      Phone- [phone]  [phone]  Anesthesia type (None, local, MAC, general) ?       MAC

## 2021-03-03 NOTE — Progress Notes (Signed)
Derek [name] 80 y.o. 1941-03-28 [id number]  Assessment & Plan:   Encounter Diagnoses  Name Primary?   Inflammation of soft tissue Yes   Personal history of colon cancer    Long term current use of anticoagulant - Eliquis    Crohn's disease of both small and large intestine without complication (HCC)    Gastroesophageal reflux disease, unspecified whether esophagitis present      I am not sure what this process was that involve his abdomen and subcutaneous tissues but it is resolved as best I can tell.  Question if it was some sort of transient vascular issue in these tissues.  We will continue to observe.  I wished him luck with his zoster outbreak. Lifestyle changes   We will schedule a surveillance colonoscopy for October so he has some time to heal up from his toe surgery in September.The risks and benefits as well as alternatives of endoscopic procedure(s) have been discussed and reviewed. All questions answered. The patient agrees to proceed.   Will hold Eliquis 2 days prior as long as that is acceptable to cardiology, rare but real increased risk of stroke off the medication has been reviewed in the past and again, along with other typical risks of endoscopic procedures which the patient is well acquainted with.  Since sucralfate as needed helps I have prescribed that apparently they can get the suspension through the [place] in a cost effective way. Meds ordered this encounter  Medications   sucralfate (CARAFATE) 1 GM/10ML suspension    Sig: Take 10 mLs (1 g total) by mouth 4 (four) times daily as needed.    Dispense:  420 mL    Refill:  11     I appreciate the opportunity to care for this patient. CC: Derek Sites, MD  Subjective:   Chief Complaint: Abdominal inflammation, heartburn  HPI Derek [name] is here with his wife, he had had a problem in July where he had a red abdominal wall that was tender.  I was unable to see him I recommended that he get help  through primary care and he saw Dr. Hilma Favors who appropriately ordered a CT scan which showed subcutaneous inflammation in the abdominal wall area.  All of those signs and symptoms resolved over about 2 to 3 days.  Subsequently he unfortunately developed a left forehead shingles infection which he is being treated for now.  His Crohn's disease of the large and small intestine is without symptoms.  He has a history of colon cancer status post resection as well as perforated bowel in the past and adenomatous colon polyps.  His last colonoscopy was in 2019 without new polyps or other abnormalities and he has been in clinical remission on no therapy for his Crohn's disease.  He is due to have toe surgery effusion on the right hallux in September and is having dental implants in November.   Nocturnal heartburn is an intermittent problem and it responds to his wife's Carafate slurry and he and she request a prescription for this.  Triggers are pretty typical citrus, tomatoes spicy foods and eating late.     Allergies  Allergen Reactions   Lorazepam Other (See Comments)    Reaction:  Hallucinations    Humira [Adalimumab] Other (See Comments)    Pt states that he got pancreatitis.     Calcium D-Glucarate [Calcium Saccharate]    Quinolones     Patient was warned about not using Cipro and similar antibiotics. Recent studies have  raised concern that fluoroquinolone antibiotics could be associated with an increased risk of aortic aneurysm Fluoroquinolones have non-antimicrobial properties that might jeopardise the integrity of the extracellular matrix of the vascular wall In a  propensity score matched cohort study in [place], there was a 66% increased rate of aortic aneurysm or dissection associated with oral fluoroquinolone use, compared wit   Current Meds  Medication Sig   Acidophilus Lactobacillus CAPS Take 1 capsule by mouth daily.   amiodarone (PACERONE) 200 MG tablet Take 1 tablet (200 mg total)  by mouth daily.   calcium carbonate (TUMS - DOSED IN MG ELEMENTAL CALCIUM) 500 MG chewable tablet Chew 2 tablets by mouth 2 (two) times daily.   Cholecalciferol (VITAMIN D) 2000 units CAPS Take 2,000 Units by mouth daily.   cyanocobalamin (,VITAMIN B-12,) 1000 MCG/ML injection Inject 1,000 mcg into the muscle. THREE TIMES A MONTH   ELIQUIS 5 MG TABS tablet Take 1 tablet (5 mg total) by mouth 2 (two) times daily.   famotidine (PEPCID) 20 MG tablet Take 20 mg by mouth daily.   febuxostat (ULORIC) 40 MG tablet Take 40 mg by mouth daily.   fluticasone (CUTIVATE) 0.05 % cream Apply topically 2 (two) times daily.   gabapentin (NEURONTIN) 100 MG capsule Take one (1) capsules (100 mg) by mouth each morning and 487m at bedtime.   HYDROcodone-acetaminophen (NORCO) 10-325 MG tablet Take 1-2 tablets by mouth 4 (four) times daily as needed.   levothyroxine (SYNTHROID) 50 MCG tablet Take 50 mcg by mouth daily before breakfast.   lidocaine (LIDODERM) 5 % Place 1 patch onto the skin daily. Remove & Discard patch within 12 hours or as directed by MD   magnesium oxide (MAG-OX) 400 (241.3 Mg) MG tablet Take 1,200 mg by mouth 2 (two) times daily.    Multiple Vitamin (MULTIVITAMIN WITH MINERALS) TABS tablet Take 1 tablet by mouth daily.   PARoxetine (PAXIL) 20 MG tablet Take 20 mg by mouth daily.    rosuvastatin (CRESTOR) 10 MG tablet Take 1 tablet (10 mg total) by mouth daily.   sodium bicarbonate 650 MG tablet Take 1,300 mg by mouth 2 (two) times daily.    traZODone (DESYREL) 100 MG tablet Take 100 mg by mouth at bedtime.   valACYclovir (VALTREX) 1000 MG tablet Take 1,000 mg by mouth 3 (three) times daily.   Past Medical History:  Diagnosis Date   Allergy    Anemia    Anxiety    Aortic insufficiency    a. mild-mod by echo 09/2015.   Arthritis    "knees; left shoulder" (09/21/2013)   Ascending aortic aneurysm (HCC)    a. last measurement 5.3 cm 03/2016 -> f/u planned 09/2015 to continue to follow.   Atrial  fibrillation ([facility]    B12 deficiency    takes Vit 12 shot every 14days    Bowel perforation ([place] 08/27/2016   Cataract    CKD (chronic kidney disease) stage 3, GFR 30-59 ml/min (HCC) 08/22/2011   Clotting disorder ([facility]    Colon cancer ([facility]    Colonic ischemia ([facility] 08/27/2016   Crohn's disease ([facility]    Depression    Enlarged prostate    Enteric hyperoxaluria 02/21/2016   GERD (gastroesophageal reflux disease)    takes Omeprazole daily   Gout    takes Uloric and Colchicine daily   Heart murmur    Hepatitis C 1978   negtive RNA load - spontaneously cleared   Hiatal hernia    High output ileostomy ([facility] 06/02/2017  History of blood transfusion 1978; 1990's; ?   "w/bowel resection; S/P allupurinol; ?" (09/21/2013)   History of colon polyps    History of kidney stones    History of MRSA infection 2010   History of pulmonary embolism 2006   both legs and both lungs /notes 08/26/2008 (09/21/2013)   History of small bowel obstruction    History of staph infection 1978   Hyperoxaluria    Intestinal   Hypertension    Insomnia    takes Trazodone nightly   Internal hemorrhoids    LV dysfunction    a. h/o EF 45-50% in 2015, normalized on subsequent echoes.   Nephrolithiasis    Nocardia infection    Pancreatitis 2010   elevated lipase and amylase, stranding in tail of pancreas, ? from Humira   Pancytopenia    Hx of   Peripheral neuropathy    takes Gabapentin daily   Pneumonia    hx of    Post-traumatic stress syndrome    takes Paxil nightly   PTSD (post-traumatic stress disorder)    Pulmonary nodule    a. 76m by CT 05/2015, recommended f/u 6-12 months.   RLS (restless legs syndrome)    Rosacea conjunctivitis(372.31)    takes Minocin daily   Secondary hyperparathyroidism ([facility] 02/21/2016   Shingles    Sinus bradycardia    Skin cancer    "cut/burned off left ear and face" (09/21/2013)   Small bowel obstruction ([facility]    Status post reversal of ileostomy 07/23/2017    Thrombocytopenia ([facility]    hx of   Past Surgical History:  Procedure Laterality Date   ANKLE SURGERY Right    [place]   CARDIOVERSION N/A 05/29/2016   Procedure: CARDIOVERSION;  Surgeon: MSanda Klein MD;  [facility];  Service: Cardiovascular;  Laterality: N/A;   CHOLECYSTECTOMY     Colon Cancer     COLON RESECTION N/A 08/14/2016   Procedure: LAPAROSCOPIC RESECTION TRANSVERSE COLON;  Surgeon: DAlphonsa Overall MD;  Location: [facility];  Service: General;  Laterality: N/A;   COLON SURGERY     COLONOSCOPY     ESOPHAGOGASTRODUODENOSCOPY     EYE SURGERY     cataract surgery bilateral   FOOT SURGERY Right    "took gout out"   HEMICOLECTOMY Right    ILEOCECETOMY  1978   /[name]10/03/2000  (09/21/2013)   ILEOSTOMY     ILEOSTOMY CLOSURE N/A 07/23/2017   Procedure: ILEOSTOMY REVERSAL ;  Surgeon: NAlphonsa Overall MD;  Location: [facility];  Service: General;  Laterality: N/A;   INGUINAL HERNIA REPAIR Right    IR FLUORO GUIDE CV LINE RIGHT  05/07/2017   IR REMOVAL TUN CV CATH W/O FL  10/08/2017   IR [place] ACC[place] RIGHT  05/07/2017   KNEE ARTHROSCOPY Left    LAPAROTOMY N/A 08/27/2016   Procedure: EXPLORATORYLAPAROTOMY, LYSIS OF ADHESIONS, ILEOSTOMY, RIGHT COLECTOMY;  Surgeon: DAlphonsa Overall MD;  Location: [facility];  Service: General;  Laterality: N/A;   LIGAMENT REPAIR Left    POLYPECTOMY     TEE WITHOUT CARDIOVERSION N/A 05/29/2016   Procedure: TRANSESOPHAGEAL ECHOCARDIOGRAM (TEE);  Surgeon: MSanda Klein MD;  [facility]  Service: Cardiovascular;  Laterality: N/A;   TOTAL SHOULDER ARTHROPLASTY Left 09/21/2013   TOTAL SHOULDER ARTHROPLASTY Left 09/21/2013   Procedure: LEFT TOTAL SHOULDER ARTHROPLASTY;  Surgeon: KMarin Shutter MD;  [facility]  Service: Orthopedics;  Laterality: Left;   Social History  Social History Narrative   Married 2 children and 6 grandchildren all local   [place] [employer]   Daily caffeine   Does not  exercise regularly   family history includes Aneurysm in his mother and sister; Hypertension in his father; Kidney disease in his father.   Review of Systems As per HPI  Objective:   Physical Exam BP 130/70   Pulse 65   Ht 6' (1.829 m)   Wt 206 lb (93.4 kg)   BMI 27.94 kg/m  Lungs are clear Normal heart sounds S1-S2 no rubs murmurs or gallops The abdomen is obese there are multiple surgical scars there is a bulge around the umbilicus do not think this is a hernia.  He is nontender and there is no rash. There is a healing dark scabbing vesicular rash in the left forehead area

## 2021-03-03 NOTE — Telephone Encounter (Signed)
Clinical pharmacist to review Eliquis

## 2021-03-04 NOTE — Telephone Encounter (Signed)
I have left a detailed message about holding the Eliquis 2 days prior to his colonoscopy on his wife [name]'s voicemail. I told her to call us back so I know they got the message.

## 2021-03-04 NOTE — Telephone Encounter (Signed)
Patient with diagnosis of atrial fibrillatoin on Eliquis for anticoagulation.    Procedure: colonoscopy Date of procedure: 05/22/2021   CHA2DS2-VASc Score = 4  This indicates a 4.8% annual risk of stroke. The patient's score is based upon: CHF History: No HTN History: Yes Diabetes History: No Stroke History: No Vascular Disease History: Yes Age Score: 2 Gender Score: 0   CrCl 38 Platelet count 125  Per office protocol, patient can hold Eliquis for 2 days prior to procedure.   Patient will not need bridging with Lovenox (enoxaparin) around procedure.  Patient should restart Eliquis on the evening of procedure or day after, at discretion of procedure MD

## 2021-03-07 NOTE — Telephone Encounter (Signed)
I left them another message to call me back.

## 2021-03-07 NOTE — Telephone Encounter (Signed)
Wife [name] called back to let me know she got the message about holding the Eliquis for 2 days prior to the procedure.

## 2021-03-11 DIAGNOSIS — B0239 Other herpes zoster eye disease: Secondary | ICD-10-CM | POA: Diagnosis not present

## 2021-03-27 ENCOUNTER — Emergency Department (HOSPITAL_COMMUNITY): Payer: No Typology Code available for payment source

## 2021-03-27 ENCOUNTER — Other Ambulatory Visit: Payer: Self-pay

## 2021-03-27 ENCOUNTER — Encounter (HOSPITAL_COMMUNITY): Payer: Self-pay

## 2021-03-27 ENCOUNTER — Emergency Department (HOSPITAL_COMMUNITY)
Admission: EM | Admit: 2021-03-27 | Discharge: 2021-03-27 | Disposition: A | Discharge status: Home / Self Care | Payer: No Typology Code available for payment source | Attending: Emergency Medicine | Admitting: Emergency Medicine

## 2021-03-27 DIAGNOSIS — L0390 Cellulitis, unspecified: Secondary | ICD-10-CM | POA: Diagnosis not present

## 2021-03-27 DIAGNOSIS — Z96612 Presence of left artificial shoulder joint: Secondary | ICD-10-CM | POA: Insufficient documentation

## 2021-03-27 DIAGNOSIS — Z87891 Personal history of nicotine dependence: Secondary | ICD-10-CM | POA: Diagnosis not present

## 2021-03-27 DIAGNOSIS — Z85038 Personal history of other malignant neoplasm of large intestine: Secondary | ICD-10-CM | POA: Diagnosis not present

## 2021-03-27 DIAGNOSIS — I480 Paroxysmal atrial fibrillation: Secondary | ICD-10-CM | POA: Insufficient documentation

## 2021-03-27 DIAGNOSIS — I129 Hypertensive chronic kidney disease with stage 1 through stage 4 chronic kidney disease, or unspecified chronic kidney disease: Secondary | ICD-10-CM | POA: Diagnosis not present

## 2021-03-27 DIAGNOSIS — N183 Chronic kidney disease, stage 3 unspecified: Secondary | ICD-10-CM | POA: Insufficient documentation

## 2021-03-27 DIAGNOSIS — L089 Local infection of the skin and subcutaneous tissue, unspecified: Secondary | ICD-10-CM | POA: Diagnosis not present

## 2021-03-27 DIAGNOSIS — N200 Calculus of kidney: Secondary | ICD-10-CM | POA: Diagnosis not present

## 2021-03-27 DIAGNOSIS — K469 Unspecified abdominal hernia without obstruction or gangrene: Secondary | ICD-10-CM | POA: Diagnosis not present

## 2021-03-27 DIAGNOSIS — Z7901 Long term (current) use of anticoagulants: Secondary | ICD-10-CM | POA: Diagnosis not present

## 2021-03-27 DIAGNOSIS — D631 Anemia in chronic kidney disease: Secondary | ICD-10-CM | POA: Insufficient documentation

## 2021-03-27 DIAGNOSIS — Z681 Body mass index (BMI) 19 or less, adult: Secondary | ICD-10-CM | POA: Diagnosis not present

## 2021-03-27 DIAGNOSIS — K8689 Other specified diseases of pancreas: Secondary | ICD-10-CM | POA: Diagnosis not present

## 2021-03-27 DIAGNOSIS — R1030 Lower abdominal pain, unspecified: Secondary | ICD-10-CM | POA: Diagnosis present

## 2021-03-27 DIAGNOSIS — R1900 Intra-abdominal and pelvic swelling, mass and lump, unspecified site: Secondary | ICD-10-CM | POA: Diagnosis not present

## 2021-03-27 DIAGNOSIS — Z79899 Other long term (current) drug therapy: Secondary | ICD-10-CM | POA: Diagnosis not present

## 2021-03-27 DIAGNOSIS — K6389 Other specified diseases of intestine: Secondary | ICD-10-CM | POA: Diagnosis not present

## 2021-03-27 LAB — COMPREHENSIVE METABOLIC PANEL
ALT: 54 U/L — ABNORMAL HIGH (ref 0–44)
AST: 43 U/L — ABNORMAL HIGH (ref 15–41)
Albumin: 3.8 g/dL (ref 3.5–5.0)
Alkaline Phosphatase: 126 U/L (ref 38–126)
Anion gap: 9 (ref 5–15)
BUN: 15 mg/dL (ref 8–23)
CO2: 27 mmol/L (ref 22–32)
Calcium: 8.8 mg/dL — ABNORMAL LOW (ref 8.9–10.3)
Chloride: 101 mmol/L (ref 98–111)
Creatinine, Ser: 1.94 mg/dL — ABNORMAL HIGH (ref 0.61–1.24)
GFR, Estimated: 35 mL/min — ABNORMAL LOW (ref >60)
Glucose, Bld: 110 mg/dL — ABNORMAL HIGH (ref 70–99)
Potassium: 5.1 mmol/L (ref 3.5–5.1)
Sodium: 137 mmol/L (ref 135–145)
Total Bilirubin: 1.1 mg/dL (ref 0.3–1.2)
Total Protein: 6.8 g/dL (ref 6.5–8.1)

## 2021-03-27 LAB — CBC WITH DIFFERENTIAL/PLATELET
Abs Immature Granulocytes: 0.04 10*3/uL (ref 0.00–0.07)
Basophils Absolute: 0 10*3/uL (ref 0.0–0.1)
Basophils Relative: 0 %
Eosinophils Absolute: 0 10*3/uL (ref 0.0–0.5)
Eosinophils Relative: 1 %
HCT: 38.2 % — ABNORMAL LOW (ref 39.0–52.0)
Hemoglobin: 12.5 g/dL — ABNORMAL LOW (ref 13.0–17.0)
Immature Granulocytes: 1 %
Lymphocytes Relative: 7 %
Lymphs Abs: 0.4 10*3/uL — ABNORMAL LOW (ref 0.7–4.0)
MCH: 30.3 pg (ref 26.0–34.0)
MCHC: 32.7 g/dL (ref 30.0–36.0)
MCV: 92.5 fL (ref 80.0–100.0)
Monocytes Absolute: 0.6 10*3/uL (ref 0.1–1.0)
Monocytes Relative: 12 %
Neutro Abs: 4.1 10*3/uL (ref 1.7–7.7)
Neutrophils Relative %: 79 %
Platelets: 101 10*3/uL — ABNORMAL LOW (ref 150–400)
RBC: 4.13 MIL/uL — ABNORMAL LOW (ref 4.22–5.81)
RDW: 16.4 % — ABNORMAL HIGH (ref 11.5–15.5)
WBC: 5.2 10*3/uL (ref 4.0–10.5)
nRBC: 0 % (ref 0.0–0.2)

## 2021-03-27 MED ORDER — SODIUM CHLORIDE 0.9 % IV SOLN
2.0000 g | Freq: Once | INTRAVENOUS | Status: AC
  Administered 2021-03-27: 2 g via INTRAVENOUS
  Filled 2021-03-27: qty 20

## 2021-03-27 MED ORDER — ONDANSETRON HCL 4 MG/2ML IJ SOLN
4.0000 mg | Freq: Once | INTRAMUSCULAR | Status: AC
  Administered 2021-03-27: 4 mg via INTRAVENOUS
  Filled 2021-03-27: qty 2

## 2021-03-27 MED ORDER — DOXYCYCLINE HYCLATE 100 MG PO CAPS: 100.0000 mg | ORAL_CAPSULE | Freq: Two times a day (BID) | ORAL | 0 refills | Status: DC

## 2021-03-27 NOTE — Consult Note (Signed)
[facility] Consult  Reason for Consult: Cellulitis  Referring Physician: Dr. Roderic Palau   Chief Complaint   Abdominal Pain; Fever     HPI: Derek [name] is a 80 y.o. male with multiple prior surgeries who came into the ED with redness over his lower abdomen which had occurred in July and he had received antibiotics. He denies any drainage. Dr. Roderic Palau asked me to look at the CT and area to ensure no drainage was needed.   CT demonstrates stranding but no collection.  The patient denies any pain or fevers. He has had multiple surgeries and has scars all over his abdominal wall.   Past Medical History:  Diagnosis Date   Allergy    Anemia    Anxiety    Aortic insufficiency    a. mild-mod by echo 09/2015.   Arthritis    "knees; left shoulder" (09/21/2013)   Ascending aortic aneurysm (HCC)    a. last measurement 5.3 cm 03/2016 -> f/u planned 09/2015 to continue to follow.   Atrial fibrillation ([place])    B12 deficiency    takes Vit 12 shot every 14days    Bowel perforation ([place]) 08/27/2016   Cataract    CKD (chronic kidney disease) stage 3, GFR 30-59 ml/min (HCC) 08/22/2011   Clotting disorder ([place])    Colon cancer ([place])    Colonic ischemia ([place]) 08/27/2016   Crohn's disease ([place])    Depression    Enlarged prostate    Enteric hyperoxaluria 02/21/2016   GERD (gastroesophageal reflux disease)    takes Omeprazole daily   Gout    takes Uloric and Colchicine daily   Heart murmur    Hepatitis C 1978   negtive RNA load - spontaneously cleared   Hiatal hernia    High output ileostomy ([place]) 06/02/2017   History of blood transfusion 1978; 1990's; ?   "w/bowel resection; S/P allupurinol; ?" (09/21/2013)   History of colon polyps    History of kidney stones    History of MRSA infection 2010   History of pulmonary embolism 2006   both legs and both lungs /notes 08/26/2008 (09/21/2013)   History of small bowel obstruction    History of staph infection 1978    Hyperoxaluria    Intestinal   Hypertension    Insomnia    takes Trazodone nightly   Internal hemorrhoids    LV dysfunction    a. h/o EF 45-50% in 2015, normalized on subsequent echoes.   Nephrolithiasis    Nocardia infection    Pancreatitis 2010   elevated lipase and amylase, stranding in tail of pancreas, ? from Humira   Pancytopenia    Hx of   Peripheral neuropathy    takes Gabapentin daily   Pneumonia    hx of    Post-traumatic stress syndrome    takes Paxil nightly   PTSD (post-traumatic stress disorder)    Pulmonary nodule    a. 55m by CT 05/2015, recommended f/u 6-12 months.   RLS (restless legs syndrome)    Rosacea conjunctivitis(372.31)    takes Minocin daily   Secondary hyperparathyroidism ([place] 02/21/2016   Shingles    Shingles    Sinus bradycardia    Skin cancer    "cut/burned off left ear and face" (09/21/2013)   [place] bowel obstruction ([place]    Status post reversal of ileostomy 07/23/2017   Thrombocytopenia (HCC)    hx of    Past Surgical History:  Procedure Laterality Date   ANKLE SURGERY Right  [place] X 2   CARDIOVERSION N/A 05/29/2016   Procedure: CARDIOVERSION;  Surgeon: Sanda Klein, MD;  Location: [facility];  Service: Cardiovascular;  Laterality: N/A;   CHOLECYSTECTOMY     Colon Cancer     COLON RESECTION N/A 08/14/2016   Procedure: LAPAROSCOPIC RESECTION TRANSVERSE COLON;  Surgeon: [name], MD;  Location: [facility];  Service: General;  Laterality: N/A;   COLON SURGERY     COLONOSCOPY     ESOPHAGOGASTRODUODENOSCOPY     EYE SURGERY     cataract surgery bilateral   FOOT SURGERY Right    "took gout out"   HEMICOLECTOMY Right    ILEOCECETOMY  1978   [name] 05/10/2000  (09/21/2013)   ILEOSTOMY     ILEOSTOMY CLOSURE N/A 07/23/2017   Procedure: ILEOSTOMY REVERSAL ;  Surgeon: [name], MD;  Location: [facility];  Service: General;  Laterality: N/A;   INGUINAL HERNIA REPAIR Right    IR FLUORO GUIDE CV  LINE RIGHT  05/07/2017   IR REMOVAL TUN CV CATH W/O FL  10/08/2017   IR US GUIDE VASC ACCESS RIGHT  05/07/2017   KNEE ARTHROSCOPY Left    LAPAROTOMY N/A 08/27/2016   Procedure: EXPLORATORYLAPAROTOMY, LYSIS OF ADHESIONS, ILEOSTOMY, RIGHT COLECTOMY;  Surgeon: [name], MD;  Location: [facility];  Service: General;  Laterality: N/A;   LIGAMENT REPAIR Left    POLYPECTOMY     TEE WITHOUT CARDIOVERSION N/A 05/29/2016   Procedure: TRANSESOPHAGEAL ECHOCARDIOGRAM (TEE);  Surgeon: Sanda Klein, MD;  Location: [facility];  Service: Cardiovascular;  Laterality: N/A;   TOTAL SHOULDER ARTHROPLASTY Left 09/21/2013   TOTAL SHOULDER ARTHROPLASTY Left 09/21/2013   Procedure: LEFT TOTAL SHOULDER ARTHROPLASTY;  Surgeon: Marin Shutter, MD;  Location: [facility];  Service: Orthopedics;  Laterality: Left;    Family History  Problem Relation Age of Onset   Kidney disease Father    Hypertension Father    Aneurysm Mother    Aneurysm Sister    Esophageal cancer Neg Hx    Stomach cancer Neg Hx    Rectal cancer Neg Hx    Colon cancer Neg Hx     Social History   Tobacco Use   Smoking status: Former    Packs/day: 2.00    Years: 20.00    Pack years: 40.00    Types: Cigarettes    Quit date: 08/04/1975    Years since quitting: 45.6   Smokeless tobacco: Former    Types: Chew    Quit date: 08/03/1978   Tobacco comments:    09/21/2013 "quit smoking in the late 1970's; stopped chewing couple years after I quit smoking"  Vaping Use   Vaping Use: Never used  Substance Use Topics   Alcohol use: No   Drug use: No    Medications: I have reviewed the patient's current medications. No current facility-administered medications for this encounter.   Current Outpatient Medications  Medication Sig Dispense Refill Last Dose   cephALEXin (KEFLEX) 500 MG capsule Take 1 capsule (500 mg total) by mouth 3 (three) times daily. 18 capsule 0    predniSONE (DELTASONE) 50 MG tablet Take 1 tablet (50 mg total) by mouth daily with  breakfast. 7 tablet 0    Facility-Administered Medications Ordered in Other Encounters  Medication Dose Route Frequency Provider Last Rate Last Admin   acetaminophen (TYLENOL) tablet 650 mg  650 mg Oral Q6H PRN Adefeso, Oladapo, DO       albuterol (VENTOLIN HFA) 108 (  90 Base) MCG/ACT inhaler 2 puff  2 puff Inhalation Q4H PRN Tat, Shanon Brow, MD       amiodarone (PACERONE) tablet 200 mg  200 mg Oral Daily Adefeso, Oladapo, DO   200 mg at 03/31/21 1050   apixaban (ELIQUIS) tablet 5 mg  5 mg Oral BID Tat, Shanon Brow, MD   5 mg at 03/31/21 1050   ascorbic acid (VITAMIN C) tablet 500 mg  500 mg Oral Daily Adefeso, Oladapo, DO   500 mg at 03/31/21 1049   calcium carbonate (TUMS - dosed in mg elemental calcium) chewable tablet 400 mg of elemental calcium  2 tablet Oral BID Adefeso, Oladapo, DO   400 mg of elemental calcium at 03/31/21 1050   ceFAZolin (ANCEF) IVPB 2g/100 mL premix  2 g Intravenous Therisa Doyne, MD 200 mL/hr at 03/31/21 1127 2 g at 03/31/21 1127   chlorpheniramine-HYDROcodone (TUSSIONEX) 10-8 MG/5ML suspension 5 mL  5 mL Oral Q12H PRN Adefeso, Oladapo, DO       doxycycline (VIBRA-TABS) tablet 100 mg  100 mg Oral Q12H Tat, Shanon Brow, MD   100 mg at 03/31/21 1051   famotidine (PEPCID) tablet 20 mg  20 mg Oral Daily Adefeso, Oladapo, DO   20 mg at 03/31/21 1051   febuxostat (ULORIC) tablet 40 mg  40 mg Oral Daily Adefeso, Oladapo, DO   40 mg at 03/31/21 1050   gabapentin (NEURONTIN) capsule 100 mg  100 mg Oral q morning Tat, David, MD   100 mg at 03/31/21 1057   gabapentin (NEURONTIN) capsule 400 mg  400 mg Oral QHS Orson Eva, MD   400 mg at 03/30/21 2134   guaiFENesin-dextromethorphan (ROBITUSSIN DM) 100-10 MG/5ML syrup 10 mL  10 mL Oral Q4H PRN Adefeso, Oladapo, DO   10 mL at 03/29/21 0654   HYDROcodone-acetaminophen (NORCO/VICODIN) 5-325 MG per tablet 1 tablet  1 tablet Oral Q6H PRN Orson Eva, MD   1 tablet at 03/30/21 2146   methylPREDNISolone sodium succinate (SOLU-MEDROL) 125 mg/2 mL injection  60 mg  60 mg Intravenous Therisa Doyne, MD   60 mg at 03/31/21 0546   ondansetron (ZOFRAN) injection 4 mg  4 mg Intravenous Q6H PRN Adefeso, Oladapo, DO   4 mg at 03/29/21 0617   pantoprazole (PROTONIX) injection 40 mg  40 mg Intravenous QHS Adefeso, Oladapo, DO   40 mg at 03/30/21 2133   PARoxetine (PAXIL) tablet 20 mg  20 mg Oral Daily Tat, David, MD   20 mg at 03/31/21 1050   remdesivir 100 mg in sodium chloride 0.9 % 100 mL IVPB  100 mg Intravenous Daily Adefeso, Oladapo, DO 200 mL/hr at 03/31/21 1056 100 mg at 03/31/21 1056   rosuvastatin (CRESTOR) tablet 10 mg  10 mg Oral Daily Adefeso, Oladapo, DO   10 mg at 03/31/21 1051   traZODone (DESYREL) tablet 100 mg  100 mg Oral QHS Orson Eva, MD   100 mg at 03/30/21 2134   zinc sulfate capsule 220 mg  220 mg Oral Daily Adefeso, Oladapo, DO   220 mg at 03/31/21 1051    Allergies  Allergen Reactions   Lorazepam Other (See Comments)    Reaction:  Hallucinations    Humira [Adalimumab] Other (See Comments)    Pt states that he got pancreatitis.     Calcium D-Glucarate [Calcium Saccharate]    Quinolones     Patient was warned about not using Cipro and similar antibiotics. Recent studies have raised concern that fluoroquinolone antibiotics could be associated with  an increased risk of aortic aneurysm Fluoroquinolones have non-antimicrobial properties that might jeopardise the integrity of the extracellular matrix of the vascular wall In a  propensity score matched cohort study in [place], there was a 66% increased rate of aortic aneurysm or dissection associated with oral fluoroquinolone use, compared wit     ROS:  A comprehensive review of systems was negative except for: redness on abdomen wall  Blood pressure 134/72, pulse 75, temperature 98.9 F (37.2 C), temperature source Oral, resp. rate 18, height 6' (1.829 m), weight 94 kg, SpO2 98 %. Physical Exam Vitals reviewed.  HENT:     Head: Normocephalic.  Eyes:     Pupils: Pupils are  equal, round, and reactive to light.  Cardiovascular:     Rate and Rhythm: Normal rate.  Pulmonary:     Effort: Pulmonary effort is normal.  Abdominal:     General: There is no distension.     Palpations: Abdomen is soft.     Tenderness: There is abdominal tenderness.     Comments: Redness on lower abdomen  Skin:    General: Skin is warm.  Neurological:     General: No focal deficit present.     Mental Status: He is alert.  Psychiatric:        Mood and Affect: Mood normal.    Results: Results for orders placed or performed during the hospital encounter of 03/27/21 (from the past 48 hour(s))  CBC with Differential/Platelet     Status: Abnormal   Collection Time: 03/27/21  9:58 AM  Result Value Ref Range   WBC 5.2 4.0 - 10.5 K/uL   RBC 4.13 (L) 4.22 - 5.81 MIL/uL   Hemoglobin 12.5 (L) 13.0 - 17.0 g/dL   HCT 38.2 (L) 39.0 - 52.0 %   MCV 92.5 80.0 - 100.0 fL   MCH 30.3 26.0 - 34.0 pg   MCHC 32.7 30.0 - 36.0 g/dL   RDW 16.4 (H) 11.5 - 15.5 %   Platelets 101 (L) 150 - 400 K/uL    Comment: SPECIMEN CHECKED FOR CLOTS Immature Platelet Fraction may be clinically indicated, consider ordering this additional test [id number] PLATELET COUNT CONFIRMED BY SMEAR    nRBC 0.0 0.0 - 0.2 %   Neutrophils Relative % 79 %   Neutro Abs 4.1 1.7 - 7.7 K/uL   Lymphocytes Relative 7 %   Lymphs Abs 0.4 (L) 0.7 - 4.0 K/uL   Monocytes Relative 12 %   Monocytes Absolute 0.6 0.1 - 1.0 K/uL   Eosinophils Relative 1 %   Eosinophils Absolute 0.0 0.0 - 0.5 K/uL   Basophils Relative 0 %   Basophils Absolute 0.0 0.0 - 0.1 K/uL   Immature Granulocytes 1 %   Abs Immature Granulocytes 0.04 0.00 - 0.07 K/uL    Comment: Performed at [facility], [address], [place], [place] [address]  Comprehensive metabolic panel     Status: Abnormal   Collection Time: 03/27/21  9:58 AM  Result Value Ref Range   Sodium 137 135 - 145 mmol/L   Potassium 5.1 3.5 - 5.1 mmol/L   Chloride 101 98 - 111 mmol/L   CO2 27 22  - 32 mmol/L   Glucose, Bld 110 (H) 70 - 99 mg/dL    Comment: Glucose reference range applies only to samples taken after fasting for at least 8 hours.   BUN 15 8 - 23 mg/dL   Creatinine, Ser 1.94 (H) 0.61 - 1.24 mg/dL   Calcium 8.8 (L) 8.9 -  10.3 mg/dL   Total Protein 6.8 6.5 - 8.1 g/dL   Albumin 3.8 3.5 - 5.0 g/dL   AST 43 (H) 15 - 41 U/L   ALT 54 (H) 0 - 44 U/L   Alkaline Phosphatase 126 38 - 126 U/L   Total Bilirubin 1.1 0.3 - 1.2 mg/dL   GFR, Estimated 35 (L) >60 mL/min    Comment: (NOTE) Calculated using the CKD-EPI Creatinine Equation (2021)    Anion gap 9 5 - 15    Comment: Performed at [facility], [address], [place], [place] [address]   Personally reviewed the CT- no collection that can be drained at this time CT ABDOMEN PELVIS WO CONTRAST  Result Date: 03/27/2021 CLINICAL DATA:  Bulge in the mid abdomen with nausea for 2-3 days EXAM: CT ABDOMEN AND PELVIS WITHOUT CONTRAST TECHNIQUE: Multidetector CT imaging of the abdomen and pelvis was performed following the standard protocol without IV contrast. COMPARISON:  CT abdomen/pelvis 02/17/2021 FINDINGS: Lower chest: There is a 5 mm nodule in the left lower lobe, unchanged. Right coronary artery calcifications are again seen. There is no pericardial effusion. Hepatobiliary: A few scattered punctate calcified granulomas in the liver are unchanged. There are no new lesions, within the confines of noncontrast technique. The gallbladder is surgically absent. There is no biliary ductal dilatation. Pancreas: Fatty atrophy of the pancreatic head is unchanged. There is no focal lesion or contour abnormality. There is no main pancreatic ductal dilatation or peripancreatic inflammatory change. Spleen: The spleen is enlarged measuring up to 15.0 cm cc. Adrenals/Urinary Tract: The adrenals are unremarkable. The kidneys again demonstrate lobular surface contours bilaterally. Multiple hypodense lesions are again seen in each kidney measuring up to  2.7 cm arising from the right lower pole. These are incompletely characterized, most likely reflects cysts. Multiple nonobstructing renal calculi are again seen on the right. There is no hydronephrosis or hydroureter. The bladder is unremarkable. Stomach/Bowel: The stomach is unremarkable. The patient is status post right colectomy. There is no evidence of complication at the anastomotic site. There are multiple mildly fluid distended loops of small and large bowel throughout the abdomen without evidence of mechanical obstruction. There is no abnormal bowel wall thickening or inflammatory change. Some loops of small bowel are closely apposed to the ventral abdominal wall in a similar configuration to the prior study, raising suspicion for adhesion. Vascular/Lymphatic: There is extensive vascular calcification throughout the abdomen. The aorta is not aneurysmal. Reproductive: The prostate and seminal vesicles are unremarkable. Other: There is no ascites or free air. Soft tissue thickening in the region of the umbilicus measuring approximately 2.9 cm by 1.9 cm is unchanged. There is no evidence of fluid collection at this site. Musculoskeletal: There is mild degenerative change of the lumbar spine. There is no acute osseous abnormality. IMPRESSION: 1. Status post right colectomy without evidence of complication at the anastomotic site. 2. Multiple mildly fluid distended loops of small and large bowel throughout the abdomen without evidence of mechanical obstruction. This may reflect diarrhea. 3. Some loops of small bowel are closely apposed to the anterior abdominal wall in a similar configuration to the prior study, raising suspicion for adhesion/tethering. 4. Focal area of soft tissue thickening in the region of the umbilicus is unchanged compared to the prior study and likely reflects scar. 5. Nonobstructing right renal calculi. 6. Splenomegaly. 7.  Aortic Atherosclerosis (ICD10-I70.0). Electronically Signed   By:  Valetta Mole M.D.   On: 03/27/2021 12:39     Assessment &  Plan:  DRAVON NOTT is a 80 y.o. male with Cellulitis but no collection to be drained.  Abdominal wall cellulitis, no signs of abscess to drain. Oral antibiotics and follow up with your PCP.   Discussed reasons for returning to ED for drainage.     Virl Cagey 03/27/2021, 12:58 PM

## 2021-03-27 NOTE — ED Notes (Signed)
GI and ED physician at bedside to speak with patient about discharge paperwork.

## 2021-03-27 NOTE — ED Notes (Signed)
Patient transported to CT 

## 2021-03-27 NOTE — ED Provider Notes (Signed)
[facility] EMERGENCY DEPARTMENT Provider Note   CSN: [id number] Arrival date & time: 03/27/21  7972     History Chief Complaint  Patient presents with   Abdominal Pain   Fever    Derek [name] is a 80 y.o. male.  Patient complains some lower abdominal pain and tenderness.  Patient has a history of bowel resection years ago for colon cancer and adhesions lysed about 3 to 4 years ago.  Patient vomited 1 time today  The history is provided by the patient and a friend. No language interpreter was used.  Abdominal Pain Pain location:  Generalized Pain quality: aching   Pain radiates to:  Does not radiate Pain severity:  Moderate Onset quality:  Sudden Duration:  1 day Timing:  Constant Progression:  Waxing and waning Chronicity:  New Context: not alcohol use   Associated symptoms: fever   Associated symptoms: no chest pain, no cough, no diarrhea, no fatigue and no hematuria   Fever Associated symptoms: no chest pain, no congestion, no cough, no diarrhea, no headaches and no rash       Past Medical History:  Diagnosis Date   Allergy    Anemia    Anxiety    Aortic insufficiency    a. mild-mod by echo 09/2015.   Arthritis    "knees; left shoulder" (09/21/2013)   Ascending aortic aneurysm (HCC)    a. last measurement 5.3 cm 03/2016 -> f/u planned 09/2015 to continue to follow.   Atrial fibrillation ([place])    B12 deficiency    takes Vit 12 shot every 14days    Bowel perforation ([place]) 08/27/2016   Cataract    CKD (chronic kidney disease) stage 3, GFR 30-59 ml/min (HCC) 08/22/2011   Clotting disorder ([place])    Colon cancer ([place])    Colonic ischemia ([place]) 08/27/2016   Crohn's disease ([place])    Depression    Enlarged prostate    Enteric hyperoxaluria 02/21/2016   GERD (gastroesophageal reflux disease)    takes Omeprazole daily   Gout    takes Uloric and Colchicine daily   Heart murmur    Hepatitis C 1978   negtive RNA load - spontaneously cleared   Hiatal hernia     High output ileostomy ([place]) 06/02/2017   History of blood transfusion 1978; 1990's; ?   "w/bowel resection; S/P allupurinol; ?" (09/21/2013)   History of colon polyps    History of kidney stones    History of MRSA infection 2010   History of pulmonary embolism 2006   both legs and both lungs /notes 08/26/2008 (09/21/2013)   History of small bowel obstruction    History of staph infection 1978   Hyperoxaluria    Intestinal   Hypertension    Insomnia    takes Trazodone nightly   Internal hemorrhoids    LV dysfunction    a. h/o EF 45-50% in 2015, normalized on subsequent echoes.   Nephrolithiasis    Nocardia infection    Pancreatitis 2010   elevated lipase and amylase, stranding in tail of pancreas, ? from Humira   Pancytopenia    Hx of   Peripheral neuropathy    takes Gabapentin daily   Pneumonia    hx of    Post-traumatic stress syndrome    takes Paxil nightly   PTSD (post-traumatic stress disorder)    Pulmonary nodule    a. 89m by CT 05/2015, recommended f/u 6-12 months.   RLS (restless legs syndrome)    Rosacea conjunctivitis(372.31)  takes Minocin daily   Secondary hyperparathyroidism ([place]) 02/21/2016   Shingles    Shingles    Sinus bradycardia    Skin cancer    "cut/burned off left ear and face" (09/21/2013)   Small bowel obstruction ([place])    Status post reversal of ileostomy 07/23/2017   Thrombocytopenia ([place])    hx of    Patient Active Problem List   Diagnosis Date Noted   Personal history of colon cancer 08/14/2019   Elevated LFTs 03/12/2019   Osteoarthritis of left knee 08/23/2018   History of gout 08/23/2018   Thoracic aortic aneurysm without rupture ([place]) 10/26/2017   Malignant neoplasm of transverse colon (HCC)    Partial small bowel obstruction (HCC) 05/09/2017   Aortic valve regurgitation 11/05/2016   Chronic anticoagulation 08/18/2016   Sinus bradycardia 08/17/2016   S/P colon resection 08/14/2016   Adenomatous polyp of transverse colon s/p  partial colectomy 08/14/2016 08/14/2016   Essential hypertension 05/27/2016   Peripheral neuropathic pain 05/26/2016   GERD (gastroesophageal reflux disease) 05/26/2016   Paroxysmal atrial fibrillation ([place]) 05/26/2016   Lung nodule 05/12/2016   SBO (small bowel obstruction) ([place]) 05/11/2016   Enteric hyperoxaluria 02/21/2016   Secondary hyperparathyroidism ([place]) 02/21/2016   Pain in joint, shoulder region 10/12/2013   Decreased range of motion of left shoulder 10/12/2013   Muscle weakness (generalized) 10/12/2013   Restriction of joint motion 10/12/2013   S/P shoulder replacement 09/21/2013   Nonischemic cardiomyopathy ([place]) 09/07/2013   Anxiety 08/22/2011   CKD (chronic kidney disease) stage 3, GFR 30-59 ml/min (HCC) 08/22/2011   Thrombocytopenia ([place]) 08/22/2011   CORONARY ATHEROSCLEROSIS NATIVE CORONARY ARTERY 06/25/2010   Ascending aortic aneurysm ([place]) 06/26/2009   Crohn's ileocolitis ([place]) 06/26/2009   B12 DEFICIENCY 05/09/2008   Gout 05/09/2008   POST TRAUMATIC STRESS SYNDROME 05/09/2008   GERD 05/09/2008   HEPATITIS C Ab positive RNA neg, HX OF 05/09/2008   PULMONARY EMBOLISM, HX OF 05/09/2008   History of small bowel obstruction 05/09/2008    Past Surgical History:  Procedure Laterality Date   ANKLE SURGERY Right    [place] X 2   CARDIOVERSION N/A 05/29/2016   Procedure: CARDIOVERSION;  Surgeon: Sanda Klein, MD;  Location: [facility];  Service: Cardiovascular;  Laterality: N/A;   CHOLECYSTECTOMY     Colon Cancer     COLON RESECTION N/A 08/14/2016   Procedure: LAPAROSCOPIC RESECTION TRANSVERSE COLON;  Surgeon: [name], MD;  Location: [facility];  Service: General;  Laterality: N/A;   COLON SURGERY     COLONOSCOPY     ESOPHAGOGASTRODUODENOSCOPY     EYE SURGERY     cataract surgery bilateral   FOOT SURGERY Right    "took gout out"   HEMICOLECTOMY Right    ILEOCECETOMY  1978   [name] 05/10/2000  (09/21/2013)   ILEOSTOMY      ILEOSTOMY CLOSURE N/A 07/23/2017   Procedure: ILEOSTOMY REVERSAL ;  Surgeon: [name], MD;  Location: [facility];  Service: General;  Laterality: N/A;   INGUINAL HERNIA REPAIR Right    IR FLUORO GUIDE CV LINE RIGHT  05/07/2017   IR REMOVAL TUN CV CATH W/O FL  10/08/2017   IR US GUIDE VASC ACCESS RIGHT  05/07/2017   KNEE ARTHROSCOPY Left    LAPAROTOMY N/A 08/27/2016   Procedure: EXPLORATORYLAPAROTOMY, LYSIS OF ADHESIONS, ILEOSTOMY, RIGHT COLECTOMY;  Surgeon: [name], MD;  Location: [facility];  Service: General;  Laterality: N/A;   LIGAMENT REPAIR Left  POLYPECTOMY     TEE WITHOUT CARDIOVERSION N/A 05/29/2016   Procedure: TRANSESOPHAGEAL ECHOCARDIOGRAM (TEE);  Surgeon: Sanda Klein, MD;  Location: [facility];  Service: Cardiovascular;  Laterality: N/A;   TOTAL SHOULDER ARTHROPLASTY Left 09/21/2013   TOTAL SHOULDER ARTHROPLASTY Left 09/21/2013   Procedure: LEFT TOTAL SHOULDER ARTHROPLASTY;  Surgeon: Marin Shutter, MD;  Location: [facility];  Service: Orthopedics;  Laterality: Left;       Family History  Problem Relation Age of Onset   Kidney disease Father    Hypertension Father    Aneurysm Mother    Aneurysm Sister    Esophageal cancer Neg Hx    Stomach cancer Neg Hx    Rectal cancer Neg Hx    Colon cancer Neg Hx     Social History   Tobacco Use   Smoking status: Former    Packs/day: 2.00    Years: 20.00    Pack years: 40.00    Types: Cigarettes    Quit date: 08/04/1975    Years since quitting: 45.6   Smokeless tobacco: Former    Types: Chew    Quit date: 08/03/1978   Tobacco comments:    09/21/2013 "quit smoking in the late 1970's; stopped chewing couple years after I quit smoking"  Vaping Use   Vaping Use: Never used  Substance Use Topics   Alcohol use: No   Drug use: No    Home Medications Prior to Admission medications   Medication Sig Start Date End Date Taking? Authorizing Provider  doxycycline (VIBRAMYCIN) 100 MG capsule Take 1 capsule (100 mg total) by mouth  2 (two) times daily. One po bid x 7 days 03/27/21  Yes Milton Ferguson, MD  Acidophilus Lactobacillus CAPS Take 1 capsule by mouth daily. 06/08/16   [name], MD  amiodarone (PACERONE) 200 MG tablet Take 1 tablet (200 mg total) by mouth daily. 10/30/20   Jerline Pain, MD  calcium carbonate (TUMS - DOSED IN MG ELEMENTAL CALCIUM) 500 MG chewable tablet Chew 2 tablets by mouth 2 (two) times daily.    [provider]  Cholecalciferol (VITAMIN D) 2000 units CAPS Take 2,000 Units by mouth daily.    [provider]  cyanocobalamin (,VITAMIN B-12,) 1000 MCG/ML injection Inject 1,000 mcg into the muscle. THREE TIMES A MONTH    [provider]  ELIQUIS 5 MG TABS tablet Take 1 tablet (5 mg total) by mouth 2 (two) times daily. 11/05/16   End, Harrell Gave, MD  famotidine (PEPCID) 20 MG tablet Take 20 mg by mouth daily.    [provider]  febuxostat (ULORIC) 40 MG tablet Take 40 mg by mouth daily.    [provider]  fluticasone (CUTIVATE) 0.05 % cream Apply topically 2 (two) times daily. 02/24/21   [provider]  gabapentin (NEURONTIN) 100 MG capsule Take one (1) capsules (100 mg) by mouth each morning and 447m at bedtime.    [provider]  HYDROcodone-acetaminophen (NORCO) 10-325 MG tablet Take 1-2 tablets by mouth 4 (four) times daily as needed. 12/05/19   [provider]  levothyroxine (SYNTHROID) 50 MCG tablet Take 50 mcg by mouth daily before breakfast.    [provider]  lidocaine (LIDODERM) 5 % Place 1 patch onto the skin daily. Remove & Discard patch within 12 hours or as directed by MD 04/02/20   HMargarita Mail PA-C  magnesium oxide (MAG-OX) 400 (241.3 Mg) MG tablet Take 1,200 mg by mouth 2 (two) times daily.  [provider]  Multiple Vitamin (MULTIVITAMIN WITH MINERALS) TABS tablet Take 1 tablet by mouth daily.    [provider]  PARoxetine (PAXIL) 20 MG tablet Take 20 mg by mouth daily.      [provider]  rosuvastatin (CRESTOR) 10 MG tablet Take 1 tablet (10 mg total) by mouth daily. 10/17/20   Jerline Pain, MD  sodium bicarbonate 650 MG tablet Take 1,300 mg by mouth 2 (two) times daily.     [provider]  sucralfate (CARAFATE) 1 GM/10ML suspension Take 10 mLs (1 g total) by mouth 4 (four) times daily as needed. 03/03/21   [name], MD  traZODone (DESYREL) 100 MG tablet Take 100 mg by mouth at bedtime.    [provider]  valACYclovir (VALTREX) 1000 MG tablet Take 1,000 mg by mouth 3 (three) times daily. 02/24/21   [provider]  Calcium Carbonate (CALCIUM 500 PO) Take 2 tablets by mouth 2 (two) times daily.   05/11/16  [provider]    Allergies    Lorazepam, Humira [adalimumab], Calcium d-glucarate [calcium saccharate], and Quinolones  Review of Systems   Review of Systems  Constitutional:  Positive for fever. Negative for appetite change and fatigue.  HENT:  Negative for congestion, ear discharge and sinus pressure.   Eyes:  Negative for discharge.  Respiratory:  Negative for cough.   Cardiovascular:  Negative for chest pain.  Gastrointestinal:  Positive for abdominal pain. Negative for diarrhea.  Genitourinary:  Negative for frequency and hematuria.  Musculoskeletal:  Negative for back pain.  Skin:  Negative for rash.  Neurological:  Negative for seizures and headaches.  Psychiatric/Behavioral:  Negative for hallucinations.    Physical Exam Updated Vital Signs BP (!) 117/100   Pulse 70   Temp 98.9 F (37.2 C) (Oral)   Resp 18   Ht 6' (1.829 m)   Wt 94 kg   SpO2 100%   BMI 28.11 kg/m   Physical Exam Vitals and nursing note reviewed.  Constitutional:      Appearance: He is well-developed.  HENT:     Head: Normocephalic.     Nose: Nose normal.  Eyes:     General: No scleral icterus.    Conjunctiva/sclera: Conjunctivae normal.  Neck:     Thyroid: No thyromegaly.  Cardiovascular:     Rate and  Rhythm: Normal rate and regular rhythm.     Heart sounds: No murmur heard.   No friction rub. No gallop.  Pulmonary:     Breath sounds: No stridor. No wheezing or rales.  Chest:     Chest wall: No tenderness.  Abdominal:     General: There is no distension.     Tenderness: There is abdominal tenderness. There is no rebound.     Comments: Minimal tenderness and redness to lower abdomen mid area.  Musculoskeletal:        General: Normal range of motion.     Cervical back: Neck supple.  Lymphadenopathy:     Cervical: No cervical adenopathy.  Skin:    Findings: No erythema or rash.  Neurological:     Mental Status: He is oriented to person, place, and time.     Motor: No abnormal muscle tone.     Coordination: Coordination normal.  Psychiatric:        Behavior: Behavior normal.    ED Results / Procedures / Treatments   Labs (all labs ordered are listed, but only abnormal results are displayed) Labs  Reviewed  CBC WITH DIFFERENTIAL/PLATELET - Abnormal; Notable for the following components:      Result Value   RBC 4.13 (*)    Hemoglobin 12.5 (*)    HCT 38.2 (*)    RDW 16.4 (*)    Platelets 101 (*)    Lymphs Abs 0.4 (*)    All other components within normal limits  COMPREHENSIVE METABOLIC PANEL - Abnormal; Notable for the following components:   Glucose, Bld 110 (*)    Creatinine, Ser 1.94 (*)    Calcium 8.8 (*)    AST 43 (*)    ALT 54 (*)    GFR, Estimated 35 (*)    All other components within normal limits    EKG None  Radiology CT ABDOMEN PELVIS WO CONTRAST  Result Date: 03/27/2021 CLINICAL DATA:  Bulge in the mid abdomen with nausea for 2-3 days EXAM: CT ABDOMEN AND PELVIS WITHOUT CONTRAST TECHNIQUE: Multidetector CT imaging of the abdomen and pelvis was performed following the standard protocol without IV contrast. COMPARISON:  CT abdomen/pelvis 02/17/2021 FINDINGS: Lower chest: There is a 5 mm nodule in the left lower lobe, unchanged. Right coronary artery  calcifications are again seen. There is no pericardial effusion. Hepatobiliary: A few scattered punctate calcified granulomas in the liver are unchanged. There are no new lesions, within the confines of noncontrast technique. The gallbladder is surgically absent. There is no biliary ductal dilatation. Pancreas: Fatty atrophy of the pancreatic head is unchanged. There is no focal lesion or contour abnormality. There is no main pancreatic ductal dilatation or peripancreatic inflammatory change. Spleen: The spleen is enlarged measuring up to 15.0 cm cc. Adrenals/Urinary Tract: The adrenals are unremarkable. The kidneys again demonstrate lobular surface contours bilaterally. Multiple hypodense lesions are again seen in each kidney measuring up to 2.7 cm arising from the right lower pole. These are incompletely characterized, most likely reflects cysts. Multiple nonobstructing renal calculi are again seen on the right. There is no hydronephrosis or hydroureter. The bladder is unremarkable. Stomach/Bowel: The stomach is unremarkable. The patient is status post right colectomy. There is no evidence of complication at the anastomotic site. There are multiple mildly fluid distended loops of small and large bowel throughout the abdomen without evidence of mechanical obstruction. There is no abnormal bowel wall thickening or inflammatory change. Some loops of small bowel are closely apposed to the ventral abdominal wall in a similar configuration to the prior study, raising suspicion for adhesion. Vascular/Lymphatic: There is extensive vascular calcification throughout the abdomen. The aorta is not aneurysmal. Reproductive: The prostate and seminal vesicles are unremarkable. Other: There is no ascites or free air. Soft tissue thickening in the region of the umbilicus measuring approximately 2.9 cm by 1.9 cm is unchanged. There is no evidence of fluid collection at this site. Musculoskeletal: There is mild degenerative change  of the lumbar spine. There is no acute osseous abnormality. IMPRESSION: 1. Status post right colectomy without evidence of complication at the anastomotic site. 2. Multiple mildly fluid distended loops of small and large bowel throughout the abdomen without evidence of mechanical obstruction. This may reflect diarrhea. 3. Some loops of small bowel are closely apposed to the anterior abdominal wall in a similar configuration to the prior study, raising suspicion for adhesion/tethering. 4. Focal area of soft tissue thickening in the region of the umbilicus is unchanged compared to the prior study and likely reflects scar. 5. Nonobstructing right renal calculi. 6. Splenomegaly. 7.  Aortic Atherosclerosis (ICD10-I70.0). Electronically Signed   By:  Valetta Mole M.D.   On: 03/27/2021 12:39    Procedures Procedures   Medications Ordered in ED Medications  cefTRIAXone (ROCEPHIN) 2 g in sodium chloride 0.9 % 100 mL IVPB (0 g Intravenous Stopped 03/27/21 1116)  ondansetron (ZOFRAN) injection 4 mg (4 mg Intravenous Given 03/27/21 1030)    ED Course  I have reviewed the triage vital signs and the nursing notes.  Pertinent labs & imaging results that were available during my care of the patient were reviewed by me and considered in my medical decision making (see chart for details).    MDM Rules/Calculators/A&P                           CT scan shows no abscess.  Suspect cellulitis to abdominal wall.  Patient was seen by Dr. Constance Haw general surgery and she agrees with outpatient treatment with an antibiotic.  Patient will be given doxycycline and will follow up with his PCP next week Final Clinical Impression(s) / ED Diagnoses Final diagnoses:  Skin infection    Rx / DC Orders ED Discharge Orders          Ordered    doxycycline (VIBRAMYCIN) 100 MG capsule  2 times daily        03/27/21 1306             Milton Ferguson, MD 03/27/21 1317

## 2021-03-27 NOTE — ED Triage Notes (Signed)
Pt complaining of abd pain in hernia area, history of abd sx in 2018, 2019, pt weak has no appetite, fever and not feeling well, pt reporting dry heaves.  Pt alert and oriented, skin warm and dry. Abd had redness noted

## 2021-03-27 NOTE — Discharge Instructions (Addendum)
Follow-up with your family doctor next week for recheck.  Return if you have much vomiting or your abdomen becomes more painful

## 2021-03-28 ENCOUNTER — Other Ambulatory Visit: Payer: Self-pay

## 2021-03-28 DIAGNOSIS — I482 Chronic atrial fibrillation, unspecified: Secondary | ICD-10-CM | POA: Diagnosis not present

## 2021-03-28 DIAGNOSIS — Z8614 Personal history of Methicillin resistant Staphylococcus aureus infection: Secondary | ICD-10-CM

## 2021-03-28 DIAGNOSIS — Z86010 Personal history of colonic polyps: Secondary | ICD-10-CM

## 2021-03-28 DIAGNOSIS — Z96612 Presence of left artificial shoulder joint: Secondary | ICD-10-CM | POA: Diagnosis present

## 2021-03-28 DIAGNOSIS — E869 Volume depletion, unspecified: Secondary | ICD-10-CM | POA: Diagnosis present

## 2021-03-28 DIAGNOSIS — Z841 Family history of disorders of kidney and ureter: Secondary | ICD-10-CM

## 2021-03-28 DIAGNOSIS — Z9049 Acquired absence of other specified parts of digestive tract: Secondary | ICD-10-CM

## 2021-03-28 DIAGNOSIS — I2510 Atherosclerotic heart disease of native coronary artery without angina pectoris: Secondary | ICD-10-CM | POA: Diagnosis present

## 2021-03-28 DIAGNOSIS — G4700 Insomnia, unspecified: Secondary | ICD-10-CM | POA: Diagnosis present

## 2021-03-28 DIAGNOSIS — N179 Acute kidney failure, unspecified: Secondary | ICD-10-CM | POA: Diagnosis present

## 2021-03-28 DIAGNOSIS — L718 Other rosacea: Secondary | ICD-10-CM | POA: Diagnosis present

## 2021-03-28 DIAGNOSIS — U071 COVID-19: Principal | ICD-10-CM | POA: Diagnosis present

## 2021-03-28 DIAGNOSIS — I428 Other cardiomyopathies: Secondary | ICD-10-CM | POA: Diagnosis not present

## 2021-03-28 DIAGNOSIS — I712 Thoracic aortic aneurysm, without rupture: Secondary | ICD-10-CM | POA: Diagnosis present

## 2021-03-28 DIAGNOSIS — H10829 Rosacea conjunctivitis, unspecified eye: Secondary | ICD-10-CM | POA: Diagnosis present

## 2021-03-28 DIAGNOSIS — K5090 Crohn's disease, unspecified, without complications: Secondary | ICD-10-CM | POA: Diagnosis present

## 2021-03-28 DIAGNOSIS — M109 Gout, unspecified: Secondary | ICD-10-CM | POA: Diagnosis present

## 2021-03-28 DIAGNOSIS — Z8249 Family history of ischemic heart disease and other diseases of the circulatory system: Secondary | ICD-10-CM

## 2021-03-28 DIAGNOSIS — I480 Paroxysmal atrial fibrillation: Secondary | ICD-10-CM | POA: Diagnosis present

## 2021-03-28 DIAGNOSIS — Z85038 Personal history of other malignant neoplasm of large intestine: Secondary | ICD-10-CM

## 2021-03-28 DIAGNOSIS — Z79899 Other long term (current) drug therapy: Secondary | ICD-10-CM

## 2021-03-28 DIAGNOSIS — E039 Hypothyroidism, unspecified: Secondary | ICD-10-CM | POA: Diagnosis present

## 2021-03-28 DIAGNOSIS — Z888 Allergy status to other drugs, medicaments and biological substances status: Secondary | ICD-10-CM

## 2021-03-28 DIAGNOSIS — L03311 Cellulitis of abdominal wall: Secondary | ICD-10-CM | POA: Diagnosis present

## 2021-03-28 DIAGNOSIS — E538 Deficiency of other specified B group vitamins: Secondary | ICD-10-CM | POA: Diagnosis present

## 2021-03-28 DIAGNOSIS — K219 Gastro-esophageal reflux disease without esophagitis: Secondary | ICD-10-CM | POA: Diagnosis present

## 2021-03-28 DIAGNOSIS — Z87442 Personal history of urinary calculi: Secondary | ICD-10-CM

## 2021-03-28 DIAGNOSIS — E785 Hyperlipidemia, unspecified: Secondary | ICD-10-CM | POA: Diagnosis present

## 2021-03-28 DIAGNOSIS — R0602 Shortness of breath: Secondary | ICD-10-CM | POA: Diagnosis not present

## 2021-03-28 DIAGNOSIS — F4310 Post-traumatic stress disorder, unspecified: Secondary | ICD-10-CM | POA: Diagnosis present

## 2021-03-28 DIAGNOSIS — I129 Hypertensive chronic kidney disease with stage 1 through stage 4 chronic kidney disease, or unspecified chronic kidney disease: Secondary | ICD-10-CM | POA: Diagnosis present

## 2021-03-28 DIAGNOSIS — Z85828 Personal history of other malignant neoplasm of skin: Secondary | ICD-10-CM

## 2021-03-28 DIAGNOSIS — G2581 Restless legs syndrome: Secondary | ICD-10-CM | POA: Diagnosis present

## 2021-03-28 DIAGNOSIS — Z7901 Long term (current) use of anticoagulants: Secondary | ICD-10-CM

## 2021-03-28 DIAGNOSIS — J9601 Acute respiratory failure with hypoxia: Secondary | ICD-10-CM | POA: Diagnosis not present

## 2021-03-28 DIAGNOSIS — J1282 Pneumonia due to coronavirus disease 2019: Secondary | ICD-10-CM | POA: Diagnosis not present

## 2021-03-28 DIAGNOSIS — N184 Chronic kidney disease, stage 4 (severe): Secondary | ICD-10-CM | POA: Diagnosis present

## 2021-03-28 DIAGNOSIS — Z86711 Personal history of pulmonary embolism: Secondary | ICD-10-CM

## 2021-03-28 DIAGNOSIS — I517 Cardiomegaly: Secondary | ICD-10-CM | POA: Diagnosis not present

## 2021-03-28 DIAGNOSIS — Z79890 Hormone replacement therapy (postmenopausal): Secondary | ICD-10-CM

## 2021-03-28 DIAGNOSIS — L089 Local infection of the skin and subcutaneous tissue, unspecified: Secondary | ICD-10-CM | POA: Diagnosis not present

## 2021-03-28 DIAGNOSIS — Z87891 Personal history of nicotine dependence: Secondary | ICD-10-CM

## 2021-03-28 DIAGNOSIS — G629 Polyneuropathy, unspecified: Secondary | ICD-10-CM | POA: Diagnosis present

## 2021-03-29 ENCOUNTER — Encounter (HOSPITAL_COMMUNITY): Payer: Self-pay

## 2021-03-29 ENCOUNTER — Other Ambulatory Visit: Payer: Self-pay

## 2021-03-29 ENCOUNTER — Inpatient Hospital Stay (HOSPITAL_COMMUNITY)
Admission: EM | Admit: 2021-03-29 | Discharge: 2021-03-31 | DRG: 177 | Disposition: A | Discharge status: Home / Self Care | Payer: Medicare Other | Attending: Internal Medicine | Admitting: Internal Medicine

## 2021-03-29 ENCOUNTER — Emergency Department (HOSPITAL_COMMUNITY): Payer: Medicare Other

## 2021-03-29 DIAGNOSIS — U071 COVID-19: Principal | ICD-10-CM | POA: Diagnosis present

## 2021-03-29 DIAGNOSIS — I482 Chronic atrial fibrillation, unspecified: Secondary | ICD-10-CM | POA: Diagnosis not present

## 2021-03-29 DIAGNOSIS — I2510 Atherosclerotic heart disease of native coronary artery without angina pectoris: Secondary | ICD-10-CM

## 2021-03-29 DIAGNOSIS — R110 Nausea: Secondary | ICD-10-CM

## 2021-03-29 DIAGNOSIS — N189 Chronic kidney disease, unspecified: Secondary | ICD-10-CM

## 2021-03-29 DIAGNOSIS — I517 Cardiomegaly: Secondary | ICD-10-CM | POA: Diagnosis not present

## 2021-03-29 DIAGNOSIS — K219 Gastro-esophageal reflux disease without esophagitis: Secondary | ICD-10-CM | POA: Diagnosis not present

## 2021-03-29 DIAGNOSIS — I480 Paroxysmal atrial fibrillation: Secondary | ICD-10-CM

## 2021-03-29 DIAGNOSIS — E039 Hypothyroidism, unspecified: Secondary | ICD-10-CM | POA: Diagnosis not present

## 2021-03-29 DIAGNOSIS — E782 Mixed hyperlipidemia: Secondary | ICD-10-CM | POA: Diagnosis not present

## 2021-03-29 DIAGNOSIS — M10019 Idiopathic gout, unspecified shoulder: Secondary | ICD-10-CM | POA: Diagnosis not present

## 2021-03-29 DIAGNOSIS — J9601 Acute respiratory failure with hypoxia: Secondary | ICD-10-CM

## 2021-03-29 DIAGNOSIS — D696 Thrombocytopenia, unspecified: Secondary | ICD-10-CM

## 2021-03-29 DIAGNOSIS — R0602 Shortness of breath: Secondary | ICD-10-CM | POA: Diagnosis not present

## 2021-03-29 DIAGNOSIS — I4891 Unspecified atrial fibrillation: Secondary | ICD-10-CM | POA: Diagnosis present

## 2021-03-29 DIAGNOSIS — E785 Hyperlipidemia, unspecified: Secondary | ICD-10-CM

## 2021-03-29 DIAGNOSIS — M109 Gout, unspecified: Secondary | ICD-10-CM | POA: Diagnosis present

## 2021-03-29 DIAGNOSIS — N179 Acute kidney failure, unspecified: Secondary | ICD-10-CM

## 2021-03-29 DIAGNOSIS — N1832 Chronic kidney disease, stage 3b: Secondary | ICD-10-CM

## 2021-03-29 LAB — CBC WITH DIFFERENTIAL/PLATELET
Abs Immature Granulocytes: 0.03 10*3/uL (ref 0.00–0.07)
Basophils Absolute: 0 10*3/uL (ref 0.0–0.1)
Basophils Relative: 0 %
Eosinophils Absolute: 0 10*3/uL (ref 0.0–0.5)
Eosinophils Relative: 0 %
HCT: 36.2 % — ABNORMAL LOW (ref 39.0–52.0)
Hemoglobin: 12 g/dL — ABNORMAL LOW (ref 13.0–17.0)
Immature Granulocytes: 1 %
Lymphocytes Relative: 11 %
Lymphs Abs: 0.5 10*3/uL — ABNORMAL LOW (ref 0.7–4.0)
MCH: 30.6 pg (ref 26.0–34.0)
MCHC: 33.1 g/dL (ref 30.0–36.0)
MCV: 92.3 fL (ref 80.0–100.0)
Monocytes Absolute: 0.7 10*3/uL (ref 0.1–1.0)
Monocytes Relative: 15 %
Neutro Abs: 3.5 10*3/uL (ref 1.7–7.7)
Neutrophils Relative %: 73 %
Platelets: 98 10*3/uL — ABNORMAL LOW (ref 150–400)
RBC: 3.92 MIL/uL — ABNORMAL LOW (ref 4.22–5.81)
RDW: 16.3 % — ABNORMAL HIGH (ref 11.5–15.5)
WBC: 4.8 10*3/uL (ref 4.0–10.5)
nRBC: 0 % (ref 0.0–0.2)

## 2021-03-29 LAB — LACTIC ACID, PLASMA
Lactic Acid, Venous: 1 mmol/L (ref 0.5–1.9)
Lactic Acid, Venous: 0.9 mmol/L (ref 0.5–1.9)

## 2021-03-29 LAB — COMPREHENSIVE METABOLIC PANEL
ALT: 34 U/L (ref 0–44)
AST: 28 U/L (ref 15–41)
Albumin: 3.7 g/dL (ref 3.5–5.0)
Alkaline Phosphatase: 126 U/L (ref 38–126)
Anion gap: 7 (ref 5–15)
BUN: 24 mg/dL — ABNORMAL HIGH (ref 8–23)
CO2: 25 mmol/L (ref 22–32)
Calcium: 7.8 mg/dL — ABNORMAL LOW (ref 8.9–10.3)
Chloride: 101 mmol/L (ref 98–111)
Creatinine, Ser: 2.44 mg/dL — ABNORMAL HIGH (ref 0.61–1.24)
GFR, Estimated: 26 mL/min — ABNORMAL LOW (ref >60)
Glucose, Bld: 119 mg/dL — ABNORMAL HIGH (ref 70–99)
Potassium: 4.3 mmol/L (ref 3.5–5.1)
Sodium: 133 mmol/L — ABNORMAL LOW (ref 135–145)
Total Bilirubin: 1 mg/dL (ref 0.3–1.2)
Total Protein: 7 g/dL (ref 6.5–8.1)

## 2021-03-29 LAB — D-DIMER, QUANTITATIVE: D-Dimer, Quant: 0.78 ug/mL-FEU — ABNORMAL HIGH (ref 0.00–0.50)

## 2021-03-29 LAB — TRIGLYCERIDES: Triglycerides: 134 mg/dL (ref <150)

## 2021-03-29 LAB — C-REACTIVE PROTEIN: CRP: 10.1 mg/dL — ABNORMAL HIGH (ref <1.0)

## 2021-03-29 LAB — FERRITIN: Ferritin: 136 ng/mL (ref 24–336)

## 2021-03-29 LAB — RESP PANEL BY RT-PCR (FLU A&B, COVID) ARPGX2
Influenza A by PCR: NEGATIVE
Influenza B by PCR: NEGATIVE
SARS Coronavirus 2 by RT PCR: POSITIVE — AB

## 2021-03-29 LAB — LACTATE DEHYDROGENASE: LDH: 135 U/L (ref 98–192)

## 2021-03-29 LAB — PROCALCITONIN: Procalcitonin: 0.48 ng/mL

## 2021-03-29 LAB — FIBRINOGEN: Fibrinogen: 559 mg/dL — ABNORMAL HIGH (ref 210–475)

## 2021-03-29 MED ORDER — GUAIFENESIN-DM 100-10 MG/5ML PO SYRP
10.0000 mL | ORAL_SOLUTION | ORAL | Status: DC | PRN
  Administered 2021-03-29: 10 mL via ORAL
  Filled 2021-03-29: qty 10

## 2021-03-29 MED ORDER — AMIODARONE HCL 200 MG PO TABS
200.0000 mg | ORAL_TABLET | Freq: Every day | ORAL | Status: DC
  Administered 2021-03-29 – 2021-03-31 (×3): 200 mg via ORAL
  Filled 2021-03-29 (×3): qty 1

## 2021-03-29 MED ORDER — SODIUM CHLORIDE 0.9 % IV SOLN: INTRAVENOUS | Status: DC

## 2021-03-29 MED ORDER — METHYLPREDNISOLONE SODIUM SUCC 125 MG IJ SOLR
60.0000 mg | Freq: Two times a day (BID) | INTRAMUSCULAR | Status: DC
  Administered 2021-03-29 – 2021-03-31 (×5): 60 mg via INTRAVENOUS
  Filled 2021-03-29 (×5): qty 2

## 2021-03-29 MED ORDER — HYDROCOD POLST-CPM POLST ER 10-8 MG/5ML PO SUER: 5.0000 mL | Freq: Two times a day (BID) | ORAL | Status: DC | PRN

## 2021-03-29 MED ORDER — PANTOPRAZOLE SODIUM 40 MG IV SOLR
40.0000 mg | Freq: Every day | INTRAVENOUS | Status: DC
  Administered 2021-03-29 – 2021-03-30 (×2): 40 mg via INTRAVENOUS
  Filled 2021-03-29 (×2): qty 40

## 2021-03-29 MED ORDER — LEVOTHYROXINE SODIUM 50 MCG PO TABS
50.0000 ug | ORAL_TABLET | Freq: Every day | ORAL | Status: DC
  Administered 2021-03-29 – 2021-03-30 (×2): 50 ug via ORAL
  Filled 2021-03-29 (×2): qty 1

## 2021-03-29 MED ORDER — ONDANSETRON HCL 4 MG/2ML IJ SOLN
4.0000 mg | Freq: Four times a day (QID) | INTRAMUSCULAR | Status: DC | PRN
  Administered 2021-03-29: 4 mg via INTRAVENOUS
  Filled 2021-03-29: qty 2

## 2021-03-29 MED ORDER — ACETAMINOPHEN 325 MG PO TABS: 650.0000 mg | ORAL_TABLET | Freq: Four times a day (QID) | ORAL | Status: DC | PRN

## 2021-03-29 MED ORDER — GABAPENTIN 100 MG PO CAPS
100.0000 mg | ORAL_CAPSULE | Freq: Every morning | ORAL | Status: DC
  Administered 2021-03-29 – 2021-03-31 (×3): 100 mg via ORAL
  Filled 2021-03-29 (×3): qty 1

## 2021-03-29 MED ORDER — ROSUVASTATIN CALCIUM 10 MG PO TABS
10.0000 mg | ORAL_TABLET | Freq: Every day | ORAL | Status: DC
  Administered 2021-03-29 – 2021-03-31 (×3): 10 mg via ORAL
  Filled 2021-03-29 (×3): qty 1

## 2021-03-29 MED ORDER — SODIUM CHLORIDE 0.9 % IV SOLN
100.0000 mg | Freq: Every day | INTRAVENOUS | Status: DC
  Administered 2021-03-30 – 2021-03-31 (×2): 100 mg via INTRAVENOUS
  Filled 2021-03-29 (×2): qty 20

## 2021-03-29 MED ORDER — HYDROCODONE-ACETAMINOPHEN 5-325 MG PO TABS
1.0000 | ORAL_TABLET | Freq: Four times a day (QID) | ORAL | Status: DC | PRN
  Administered 2021-03-29 – 2021-03-30 (×3): 1 via ORAL
  Filled 2021-03-29 (×4): qty 1

## 2021-03-29 MED ORDER — LEVOTHYROXINE SODIUM 50 MCG PO TABS: 50.0000 ug | ORAL_TABLET | Freq: Every day | ORAL | Status: DC

## 2021-03-29 MED ORDER — FEBUXOSTAT 40 MG PO TABS
40.0000 mg | ORAL_TABLET | Freq: Every day | ORAL | Status: DC
  Administered 2021-03-29 – 2021-03-31 (×3): 40 mg via ORAL
  Filled 2021-03-29 (×3): qty 1

## 2021-03-29 MED ORDER — ALBUTEROL SULFATE HFA 108 (90 BASE) MCG/ACT IN AERS
2.0000 | INHALATION_SPRAY | Freq: Four times a day (QID) | RESPIRATORY_TRACT | Status: DC
  Administered 2021-03-29 – 2021-03-30 (×4): 2 via RESPIRATORY_TRACT
  Filled 2021-03-29: qty 6.7

## 2021-03-29 MED ORDER — DEXAMETHASONE SODIUM PHOSPHATE 10 MG/ML IJ SOLN
6.0000 mg | Freq: Once | INTRAMUSCULAR | Status: AC
  Administered 2021-03-29: 6 mg via INTRAVENOUS
  Filled 2021-03-29: qty 1

## 2021-03-29 MED ORDER — TRAZODONE HCL 50 MG PO TABS
100.0000 mg | ORAL_TABLET | Freq: Every day | ORAL | Status: DC
  Administered 2021-03-29 – 2021-03-30 (×2): 100 mg via ORAL
  Filled 2021-03-29 (×2): qty 2

## 2021-03-29 MED ORDER — APIXABAN 5 MG PO TABS
5.0000 mg | ORAL_TABLET | Freq: Two times a day (BID) | ORAL | Status: DC
  Administered 2021-03-29 – 2021-03-31 (×5): 5 mg via ORAL
  Filled 2021-03-29 (×5): qty 1

## 2021-03-29 MED ORDER — DOXYCYCLINE HYCLATE 100 MG PO TABS
100.0000 mg | ORAL_TABLET | Freq: Two times a day (BID) | ORAL | Status: DC
  Administered 2021-03-29 – 2021-03-31 (×5): 100 mg via ORAL
  Filled 2021-03-29 (×5): qty 1

## 2021-03-29 MED ORDER — ZINC SULFATE 220 (50 ZN) MG PO CAPS
220.0000 mg | ORAL_CAPSULE | Freq: Every day | ORAL | Status: DC
  Administered 2021-03-29 – 2021-03-31 (×3): 220 mg via ORAL
  Filled 2021-03-29 (×3): qty 1

## 2021-03-29 MED ORDER — CALCIUM CARBONATE ANTACID 500 MG PO CHEW
2.0000 | CHEWABLE_TABLET | Freq: Two times a day (BID) | ORAL | Status: DC
  Administered 2021-03-29 – 2021-03-31 (×5): 400 mg via ORAL
  Filled 2021-03-29 (×5): qty 2

## 2021-03-29 MED ORDER — REMDESIVIR 100 MG IV SOLR
100.0000 mg | INTRAVENOUS | Status: AC
Start: 2021-03-29 — End: 2021-03-29
  Administered 2021-03-29 (×2): 100 mg via INTRAVENOUS
  Filled 2021-03-29 (×2): qty 20

## 2021-03-29 MED ORDER — GABAPENTIN 400 MG PO CAPS
400.0000 mg | ORAL_CAPSULE | Freq: Every day | ORAL | Status: DC
  Administered 2021-03-29 – 2021-03-30 (×2): 400 mg via ORAL
  Filled 2021-03-29 (×2): qty 1

## 2021-03-29 MED ORDER — ASCORBIC ACID 500 MG PO TABS
500.0000 mg | ORAL_TABLET | Freq: Every day | ORAL | Status: DC
  Administered 2021-03-29 – 2021-03-31 (×3): 500 mg via ORAL
  Filled 2021-03-29 (×3): qty 1

## 2021-03-29 MED ORDER — ENOXAPARIN SODIUM 30 MG/0.3ML IJ SOSY: 30.0000 mg | PREFILLED_SYRINGE | Freq: Every day | INTRAMUSCULAR | Status: DC

## 2021-03-29 MED ORDER — FAMOTIDINE 20 MG PO TABS
20.0000 mg | ORAL_TABLET | Freq: Every day | ORAL | Status: DC
  Administered 2021-03-29 – 2021-03-31 (×3): 20 mg via ORAL
  Filled 2021-03-29 (×3): qty 1

## 2021-03-29 MED ORDER — PAROXETINE HCL 20 MG PO TABS
20.0000 mg | ORAL_TABLET | Freq: Every day | ORAL | Status: DC
  Administered 2021-03-29 – 2021-03-31 (×3): 20 mg via ORAL
  Filled 2021-03-29 (×3): qty 1

## 2021-03-29 NOTE — Care Management Obs Status (Signed)
[facility] NOTIFICATION   Patient Details  Name: DETRON CARRAS MRN: [id number] Date of Birth: September 28, 1940   Medicare Observation Status Notification Given:  Yes    Natasha Bence, LCSW 03/29/2021, 5:27 PM

## 2021-03-29 NOTE — ED Triage Notes (Signed)
Pt arrived via [facility]  states that he tested positive for COVID ar 1400 08/26. States that he is very weak, SOB, sore throat, unable to eat, et is very nauseated

## 2021-03-29 NOTE — Progress Notes (Signed)
PROGRESS NOTE  Derek [name] FTD:[id number] DOB: Feb 08, 1941 DOA: 03/29/2021 PCP: Sharilyn Sites, MD  Brief History:  80 year old male with a history of atrial fibrillation on apixaban, CKD stage IIIb, hyperlipidemia, B12 deficiency, cardiac arrest, peripheral neuropathy presenting with coughing and shortness of breath that began on 03/25/2021.  Patient states that his wife has been COVID-positive.  Because of his symptoms, he used a home test that was positive on 03/28/2021.  He stated that his shortness of breath and coughing have progressively worsened.  He has had some subjective fevers and chills.  He had 1 episode of nausea and vomiting without any blood.  He denies any hemoptysis but is coughing up yellow sputum.  He denies any headache, neck pain, worsening abdominal pain, diarrhea, hematochezia, melena, dysuria, hematuria.  He has been vaccinated x3. Notably, the patient visited the emergency department on 03/27/2021 because of abdominal pain.  CT of the abdomen and pelvis at that time showed some loops of small bowel that were closely opposed to the anterior abdominal wall and configuration which was essentially unchanged attributable to possible adhesions or tethering.  There was some focal soft tissue thickening of the umbilicus area that was unchanged.  The patient was seen by general surgery, Dr. Constance Haw.  The patient was discharged home in stable condition for cellulitis of his abdominal wall.  He has been taking his doxycycline.  He states that his abdominal pain is improving.  His last bowel movement was in the emergency department.  He is passing flatus. In the emergency department, the patient had an oxygen saturation of 86% on room air.  He was placed on 2 L with saturation up to 95%.  The patient was hemodynamically stable.  BMP showed a sodium 133, potassium 4.3, serum creatinine 2.44.  LFTs were unremarkable.  WBC 14.8, hemoglobin 12.0, platelets 98,000.  The patient was  given a dose of dexamethasone and started on remdesivir.  Assessment/Plan: Acute respiratory failure with hypoxia secondary to COVID-19 -Stable on 2 L nasal cannula -CRP 10.1>> -Ferritin 136>> -D-dimer 0.78>> -PCT 0.48 -Continue IV remdesivir -Restart IV steroids -Wean oxygen for saturation greater 90% -Personally reviewed chest x-ray--no infiltrates or edema  Abdominal wall cellulitis -Continue doxycycline  Acute on chronic renal failure--CKD stage IIIb -Baseline creatinine 1.8-1.9 -Secondary to volume depletion -Start judicious IV fluids -Presented with serum creatinine 2.44  Paroxysmal atrial fibrillation -Continue amiodarone  -Continue apixaban -Status post cardioversion 05/29/2016 -Remains in sinus rhythm  Thoracic aortic aneurysm -Patient follows up with cardiothoracic surgery, Dr. Orvan Seen -Noted to be 5 cm on previous CT  Nonischemic cardiomyopathy -Has had normalization of his EF -Follows Dr. Marlou Porch -09/06/2019 echo EF 60 to 65%  Hyperlipidemia -Continue statin  Hypothyroid -Continue Synthroid  Coronary artery disease -No chest pain presently -10/25/2020 Myoview low risk -continue statin       Status is: Observation  The patient will require care spanning > 2 midnights and should be moved to inpatient because: Inpatient level of care appropriate due to severity of illness  Dispo: The patient is from: Home              Anticipated d/c is to: Home              Patient currently is not medically stable to d/c.   Difficult to place patient No        Family Communication:   no Family at bedside  Consultants:  none  Code Status:  FULL   DVT Prophylaxis:  apixaban   Procedures: As Listed in Progress Note Above  Antibiotics: doxy   Total time spent 35 minutes.  Greater than 50% spent face to face counseling and coordinating care.    Subjective: Pt complains of cough with yellow sputum.  Complains of sore throat.  Denies n/v/d.  Abd  pain is improving.  Had BM last night in ED.  No HA  Objective: Vitals:   03/29/21 0200 03/29/21 0300 03/29/21 0400 03/29/21 0700  BP: 114/67 116/68 110/61 (!) 106/57  Pulse: 68 66 65 65  Resp: (!) 24 (!) 25 (!) 30 17  Temp:      TempSrc:      SpO2: 98% 95% 95% 93%    Intake/Output Summary (Last 24 hours) at 03/29/2021 0725 Last data filed at 03/29/2021 0655 Gross per 24 hour  Intake 197.79 ml  Output --  Net 197.79 ml   Weight change:  Exam:  General:  Pt is alert, follows commands appropriately, not in acute distress HEENT: No icterus, No thrush, No neck mass, [place]/AT Cardiovascular: RRR, S1/S2, no rubs, no gallops Respiratory: bibasilar rales. No wheeze Abdomen: Soft/+BS, non tender, non distended, no guarding Extremities: No edema, No lymphangitis, No petechiae, No rashes, no synovitis   Data Reviewed: I have personally reviewed following labs and imaging studies Basic Metabolic Panel: Recent Labs  Lab 03/27/21 0958 03/29/21 0223  NA 137 133*  K 5.1 4.3  CL 101 101  CO2 27 25  GLUCOSE 110* 119*  BUN 15 24*  CREATININE 1.94* 2.44*  CALCIUM 8.8* 7.8*   Liver Function Tests: Recent Labs  Lab 03/27/21 0958 03/29/21 0223  AST 43* 28  ALT 54* 34  ALKPHOS 126 126  BILITOT 1.1 1.0  PROT 6.8 7.0  ALBUMIN 3.8 3.7   No results for input(s): LIPASE, AMYLASE in the last 168 hours. No results for input(s): AMMONIA in the last 168 hours. Coagulation Profile: No results for input(s): INR, PROTIME in the last 168 hours. CBC: Recent Labs  Lab 03/27/21 0958 03/29/21 0223  WBC 5.2 4.8  NEUTROABS 4.1 3.5  HGB 12.5* 12.0*  HCT 38.2* 36.2*  MCV 92.5 92.3  PLT 101* 98*   Cardiac Enzymes: No results for input(s): CKTOTAL, CKMB, CKMBINDEX, TROPONINI in the last 168 hours. BNP: Invalid input(s): POCBNP CBG: No results for input(s): GLUCAP in the last 168 hours. HbA1C: No results for input(s): HGBA1C in the last 72 hours. Urine analysis:    Component Value  Date/Time   COLORURINE YELLOW 03/13/2019 1931   APPEARANCEUR CLEAR 03/13/2019 1931   LABSPEC 1.017 03/13/2019 1931   PHURINE 8.0 03/13/2019 1931   GLUCOSEU NEGATIVE 03/13/2019 1931   HGBUR NEGATIVE 03/13/2019 1931   BILIRUBINUR NEGATIVE 03/13/2019 [place] NEGATIVE 03/13/2019 1931   PROTEINUR 30 (A) 03/13/2019 1931   NITRITE NEGATIVE 03/13/2019 1931   LEUKOCYTESUR NEGATIVE 03/13/2019 1931   Sepsis Labs: @LABRCNTIP (procalcitonin:4,lacticidven:4) ) Recent Results (from the past 240 hour(s))  Blood Culture (routine x 2)     Status: None (Preliminary result)   Collection Time: 03/29/21  2:45 AM   Specimen: Vein; Blood  Result Value Ref Range Status   Specimen Description RIGHT ANTECUBITAL  Final   Special Requests   Final    BOTTLES DRAWN AEROBIC AND ANAEROBIC Blood Culture results may not be optimal due to an excessive volume of blood received in culture bottles Performed at [facility], [address] [place], [place] [address]    Culture  PENDING  Incomplete   Report Status PENDING  Incomplete  Blood Culture (routine x 2)     Status: None (Preliminary result)   Collection Time: 03/29/21  2:47 AM   Specimen: Vein; Blood  Result Value Ref Range Status   Specimen Description BLOOD LEFT HAND  Final   Special Requests   Final    BOTTLES DRAWN AEROBIC AND ANAEROBIC Blood Culture adequate volume Performed at [facility], [address], [place], [place] [address]    Culture PENDING  Incomplete   Report Status PENDING  Incomplete  Resp Panel by RT-PCR (Flu A&B, Covid) Nasopharyngeal Swab     Status: Abnormal   Collection Time: 03/29/21  3:42 AM   Specimen: Nasopharyngeal Swab; Nasopharyngeal(NP) swabs in vial transport medium  Result Value Ref Range Status   SARS Coronavirus 2 by RT PCR POSITIVE (A) NEGATIVE Final    Comment: RESULT CALLED TO, READ BACK BY AND VERIFIED WITH: EVANS,H @ 6378 ON 03/29/21 BY JUW (NOTE) SARS-CoV-2 target nucleic acids are DETECTED.  The  SARS-CoV-2 RNA is generally detectable in upper respiratory specimens during the acute phase of infection. Positive results are indicative of the presence of the identified virus, but do not rule out bacterial infection or co-infection with other pathogens not detected by the test. Clinical correlation with patient history and other diagnostic information is necessary to determine patient infection status. The expected result is Negative.  Fact Sheet for Patients: EntrepreneurPulse.com.au  Fact Sheet for Healthcare Providers: IncredibleEmployment.be  This test is not yet approved or cleared by the [place] FDA and  has been authorized for detection and/or diagnosis of SARS-CoV-2 by FDA under an Emergency Use Authorization (EUA).  This EUA will remain in effect (meaning this test can be  used) for the duration of  the COVID-19 declaration under Section 564(b)(1) of the Act, 21 U.S.C. section 360bbb-3(b)(1), unless the authorization is terminated or revoked sooner.     Influenza A by PCR NEGATIVE NEGATIVE Final   Influenza B by PCR NEGATIVE NEGATIVE Final    Comment: (NOTE) The Xpert Xpress SARS-CoV-2/FLU/RSV plus assay is intended as an aid in the diagnosis of influenza from Nasopharyngeal swab specimens and should not be used as a sole basis for treatment. Nasal washings and aspirates are unacceptable for Xpert Xpress SARS-CoV-2/FLU/RSV testing.  Fact Sheet for Patients: EntrepreneurPulse.com.au  Fact Sheet for Healthcare Providers: IncredibleEmployment.be  This test is not yet approved or cleared by the [place] FDA and has been authorized for detection and/or diagnosis of SARS-CoV-2 by FDA under an Emergency Use Authorization (EUA). This EUA will remain in effect (meaning this test can be used) for the duration of the COVID-19 declaration under Section 564(b)(1) of the Act, 21 U.S.C. section  360bbb-3(b)(1), unless the authorization is terminated or revoked.  Performed at [facility], [address], [place], [place] [address]      Scheduled Meds:  albuterol  2 puff Inhalation Q6H   amiodarone  200 mg Oral Daily   vitamin C  500 mg Oral Daily   calcium carbonate  2 tablet Oral BID   enoxaparin (LOVENOX) injection  30 mg Subcutaneous Daily   famotidine  20 mg Oral Daily   febuxostat  40 mg Oral Daily   levothyroxine  50 mcg Oral Q0600   pantoprazole (PROTONIX) IV  40 mg Intravenous QHS   rosuvastatin  10 mg Oral Daily   zinc sulfate  220 mg Oral Daily   Continuous Infusions:  [START ON 03/30/2021] remdesivir  100 mg in NS 100 mL      Procedures/Studies: CT ABDOMEN PELVIS WO CONTRAST  Result Date: 03/27/2021 CLINICAL DATA:  Bulge in the mid abdomen with nausea for 2-3 days EXAM: CT ABDOMEN AND PELVIS WITHOUT CONTRAST TECHNIQUE: Multidetector CT imaging of the abdomen and pelvis was performed following the standard protocol without IV contrast. COMPARISON:  CT abdomen/pelvis 02/17/2021 FINDINGS: Lower chest: There is a 5 mm nodule in the left lower lobe, unchanged. Right coronary artery calcifications are again seen. There is no pericardial effusion. Hepatobiliary: A few scattered punctate calcified granulomas in the liver are unchanged. There are no new lesions, within the confines of noncontrast technique. The gallbladder is surgically absent. There is no biliary ductal dilatation. Pancreas: Fatty atrophy of the pancreatic head is unchanged. There is no focal lesion or contour abnormality. There is no main pancreatic ductal dilatation or peripancreatic inflammatory change. Spleen: The spleen is enlarged measuring up to 15.0 cm cc. Adrenals/Urinary Tract: The adrenals are unremarkable. The kidneys again demonstrate lobular surface contours bilaterally. Multiple hypodense lesions are again seen in each kidney measuring up to 2.7 cm arising from the right lower pole. These are  incompletely characterized, most likely reflects cysts. Multiple nonobstructing renal calculi are again seen on the right. There is no hydronephrosis or hydroureter. The bladder is unremarkable. Stomach/Bowel: The stomach is unremarkable. The patient is status post right colectomy. There is no evidence of complication at the anastomotic site. There are multiple mildly fluid distended loops of small and large bowel throughout the abdomen without evidence of mechanical obstruction. There is no abnormal bowel wall thickening or inflammatory change. Some loops of small bowel are closely apposed to the ventral abdominal wall in a similar configuration to the prior study, raising suspicion for adhesion. Vascular/Lymphatic: There is extensive vascular calcification throughout the abdomen. The aorta is not aneurysmal. Reproductive: The prostate and seminal vesicles are unremarkable. Other: There is no ascites or free air. Soft tissue thickening in the region of the umbilicus measuring approximately 2.9 cm by 1.9 cm is unchanged. There is no evidence of fluid collection at this site. Musculoskeletal: There is mild degenerative change of the lumbar spine. There is no acute osseous abnormality. IMPRESSION: 1. Status post right colectomy without evidence of complication at the anastomotic site. 2. Multiple mildly fluid distended loops of small and large bowel throughout the abdomen without evidence of mechanical obstruction. This may reflect diarrhea. 3. Some loops of small bowel are closely apposed to the anterior abdominal wall in a similar configuration to the prior study, raising suspicion for adhesion/tethering. 4. Focal area of soft tissue thickening in the region of the umbilicus is unchanged compared to the prior study and likely reflects scar. 5. Nonobstructing right renal calculi. 6. Splenomegaly. 7.  Aortic Atherosclerosis (ICD10-I70.0). Electronically Signed   By: Valetta Mole M.D.   On: 03/27/2021 12:39   DG  Chest Port 1 View  Result Date: 03/29/2021 CLINICAL DATA:  Shortness of breath. EXAM: PORTABLE CHEST 1 VIEW COMPARISON:  Chest radiograph dated 04/04/2020 and CT dated 11/21/2020. FINDINGS: No focal consolidation, pleural effusion or pneumothorax. Mild cardiomegaly. Atherosclerotic calcification of the aorta. Degenerative changes of spine. Left shoulder hemiarthroplasty. Acute osseous pathology. IMPRESSION: No acute cardiopulmonary process. Electronically Signed   By: Anner Crete M.D.   On: 03/29/2021 02:08    Orson Eva, DO  [facility]  If 7PM-7AM, please contact night-coverage [email or web address] Password [email or web address] 03/29/2021, 7:25 AM   LOS: 0 days

## 2021-03-29 NOTE — ED Provider Notes (Addendum)
[facility] Emergency Department Provider Note MRN:  [id number]  Arrival date & time: 03/29/21     Chief Complaint   Shortness of Breath   History of Present Illness   Derek [name] is a 80 y.o. year-old male with a history of A. fib, Crohn's, CKD presenting to the ED with chief complaint of shortness of breath.  Location: Lungs Duration: 4 days Onset: Gradual Timing: Constant Description: Feels a bit short of air Severity: Mild Exacerbating/Alleviating Factors: None Associated Symptoms: Sore throat, productive cough Pertinent Negatives: Denies chest pain, no headache or vision change  Additional History: Tested positive for COVID at home  Review of Systems  A complete 10 system review of systems was obtained and all systems are negative except as noted in the HPI and PMH.   Patient's Health History    Past Medical History:  Diagnosis Date   Allergy    Anemia    Anxiety    Aortic insufficiency    a. mild-mod by echo 09/2015.   Arthritis    "knees; left shoulder" (09/21/2013)   Ascending aortic aneurysm (HCC)    a. last measurement 5.3 cm 03/2016 -> f/u planned 09/2015 to continue to follow.   Atrial fibrillation ([place])    B12 deficiency    takes Vit 12 shot every 14days    Bowel perforation ([place]) 08/27/2016   Cataract    CKD (chronic kidney disease) stage 3, GFR 30-59 ml/min (HCC) 08/22/2011   Clotting disorder ([place])    Colon cancer ([place])    Colonic ischemia ([place]) 08/27/2016   Crohn's disease ([place])    Depression    Enlarged prostate    Enteric hyperoxaluria 02/21/2016   GERD (gastroesophageal reflux disease)    takes Omeprazole daily   Gout    takes Uloric and Colchicine daily   Heart murmur    Hepatitis C 1978   negtive RNA load - spontaneously cleared   Hiatal hernia    High output ileostomy ([place]) 06/02/2017   History of blood transfusion 1978; 1990's; ?   "w/bowel resection; S/P allupurinol; ?" (09/21/2013)   History of colon  polyps    History of kidney stones    History of MRSA infection 2010   History of pulmonary embolism 2006   both legs and both lungs /notes 08/26/2008 (09/21/2013)   History of small bowel obstruction    History of staph infection 1978   Hyperoxaluria    Intestinal   Hypertension    Insomnia    takes Trazodone nightly   Internal hemorrhoids    LV dysfunction    a. h/o EF 45-50% in 2015, normalized on subsequent echoes.   Nephrolithiasis    Nocardia infection    Pancreatitis 2010   elevated lipase and amylase, stranding in tail of pancreas, ? from Humira   Pancytopenia    Hx of   Peripheral neuropathy    takes Gabapentin daily   Pneumonia    hx of    Post-traumatic stress syndrome    takes Paxil nightly   PTSD (post-traumatic stress disorder)    Pulmonary nodule    a. 25m by CT 05/2015, recommended f/u 6-12 months.   RLS (restless legs syndrome)    Rosacea conjunctivitis(372.31)    takes Minocin daily   Secondary hyperparathyroidism ([place] 02/21/2016   Shingles    Shingles    Sinus bradycardia    Skin cancer    "cut/burned off left ear and face" (09/21/2013)   Small bowel obstruction ([place]  Status post reversal of ileostomy 07/23/2017   Thrombocytopenia (HCC)    hx of    Past Surgical History:  Procedure Laterality Date   ANKLE SURGERY Right    APPENDECTOMY  1978   BOWEL RESECTION  1978 X 2   CARDIOVERSION N/A 05/29/2016   Procedure: CARDIOVERSION;  Surgeon: Sanda Klein, MD;  Location: [facility];  Service: Cardiovascular;  Laterality: N/A;   CHOLECYSTECTOMY     Colon Cancer     COLON RESECTION N/A 08/14/2016   Procedure: LAPAROSCOPIC RESECTION TRANSVERSE COLON;  Surgeon: [name], MD;  Location: [facility];  Service: General;  Laterality: N/A;   COLON SURGERY     COLONOSCOPY     ESOPHAGOGASTRODUODENOSCOPY     EYE SURGERY     cataract surgery bilateral   FOOT SURGERY Right    "took gout out"   HEMICOLECTOMY Right    ILEOCECETOMY  1978   [name]  05/10/2000  (09/21/2013)   ILEOSTOMY     ILEOSTOMY CLOSURE N/A 07/23/2017   Procedure: ILEOSTOMY REVERSAL ;  Surgeon: [name], MD;  Location: [facility];  Service: General;  Laterality: N/A;   INGUINAL HERNIA REPAIR Right    IR FLUORO GUIDE CV LINE RIGHT  05/07/2017   IR REMOVAL TUN CV CATH W/O FL  10/08/2017   IR US GUIDE VASC ACCESS RIGHT  05/07/2017   KNEE ARTHROSCOPY Left    LAPAROTOMY N/A 08/27/2016   Procedure: EXPLORATORYLAPAROTOMY, LYSIS OF ADHESIONS, ILEOSTOMY, RIGHT COLECTOMY;  Surgeon: [name], MD;  Location: [facility];  Service: General;  Laterality: N/A;   LIGAMENT REPAIR Left    POLYPECTOMY     TEE WITHOUT CARDIOVERSION N/A 05/29/2016   Procedure: TRANSESOPHAGEAL ECHOCARDIOGRAM (TEE);  Surgeon: Sanda Klein, MD;  Location: [facility];  Service: Cardiovascular;  Laterality: N/A;   TOTAL SHOULDER ARTHROPLASTY Left 09/21/2013   TOTAL SHOULDER ARTHROPLASTY Left 09/21/2013   Procedure: LEFT TOTAL SHOULDER ARTHROPLASTY;  Surgeon: Marin Shutter, MD;  Location: [facility];  Service: Orthopedics;  Laterality: Left;    Family History  Problem Relation Age of Onset   Kidney disease Father    Hypertension Father    Aneurysm Mother    Aneurysm Sister    Esophageal cancer Neg Hx    Stomach cancer Neg Hx    Rectal cancer Neg Hx    Colon cancer Neg Hx     Social History   Socioeconomic History   Marital status: Married    Spouse name: [name]   Number of children: 2   Years of education: Not on file   Highest education level: Not on file  Occupational History   Occupation: Retired  Tobacco Use   Smoking status: Former    Packs/day: 2.00    Years: 20.00    Pack years: 40.00    Types: Cigarettes    Quit date: 08/04/1975    Years since quitting: 45.6   Smokeless tobacco: Former    Types: Chew    Quit date: 08/03/1978   Tobacco comments:    09/21/2013 "quit smoking in the late 1970's; stopped chewing couple years after I quit smoking"  Vaping Use   Vaping Use: Never used  Substance  and Sexual Activity   Alcohol use: No   Drug use: No   Sexual activity: Not Currently  Other Topics Concern   Not on file  Social History Narrative   Married 2 children and 6 grandchildren all local   [place] [employer]   Daily caffeine   Does not exercise  regularly   Social Determinants of Health   Financial Resource Strain: Not on file  Food Insecurity: Not on file  Transportation Needs: Not on file  Physical Activity: Not on file  Stress: Not on file  Social Connections: Not on file  Intimate Partner Violence: Not on file     Physical Exam   Vitals:   03/29/21 0300 03/29/21 0400  BP: 116/68 110/61  Pulse: 66 65  Resp: (!) 25 (!) 30  Temp:    SpO2: 95% 95%    CONSTITUTIONAL: Chronically ill-appearing, NAD NEURO:  Alert and oriented x 3, no focal deficits EYES:  eyes equal and reactive ENT/NECK:  no LAD, no JVD CARDIO: Regular rate, well-perfused, normal S1 and S2 PULM:  CTAB no wheezing or rhonchi GI/GU:  normal bowel sounds, non-distended, non-tender MSK/SPINE:  No gross deformities, no edema SKIN:  no rash, atraumatic PSYCH:  Appropriate speech and behavior  *Additional and/or pertinent findings included in MDM below  Diagnostic and Interventional Summary    EKG Interpretation  Date/Time:  Saturday March 29 2021 00:59:10 EDT Ventricular Rate:  71 PR Interval:  201 QRS Duration: 109 QT Interval:  422 QTC Calculation: 459 R Axis:   -43 Text Interpretation: Sinus rhythm Left anterior fascicular block Abnormal R-wave progression, late transition Confirmed by Gerlene Fee [phone]) on 03/29/2021 5:02:17 AM       Labs Reviewed  RESP PANEL BY RT-PCR (FLU A&B, COVID) ARPGX2 - Abnormal; Notable for the following components:      Result Value   SARS Coronavirus 2 by RT PCR POSITIVE (*)    All other components within normal limits  CBC WITH DIFFERENTIAL/PLATELET - Abnormal; Notable for the following components:   RBC 3.92 (*)    Hemoglobin 12.0 (*)    HCT  36.2 (*)    RDW 16.3 (*)    Platelets 98 (*)    Lymphs Abs 0.5 (*)    All other components within normal limits  COMPREHENSIVE METABOLIC PANEL - Abnormal; Notable for the following components:   Sodium 133 (*)    Glucose, Bld 119 (*)    BUN 24 (*)    Creatinine, Ser 2.44 (*)    Calcium 7.8 (*)    GFR, Estimated 26 (*)    All other components within normal limits  D-DIMER, QUANTITATIVE - Abnormal; Notable for the following components:   D-Dimer, Quant 0.78 (*)    All other components within normal limits  FIBRINOGEN - Abnormal; Notable for the following components:   Fibrinogen 559 (*)    All other components within normal limits  C-REACTIVE PROTEIN - Abnormal; Notable for the following components:   CRP 10.1 (*)    All other components within normal limits  CULTURE, BLOOD (ROUTINE X 2)  CULTURE, BLOOD (ROUTINE X 2)  LACTIC ACID, PLASMA  PROCALCITONIN  LACTATE DEHYDROGENASE  FERRITIN  TRIGLYCERIDES  LACTIC ACID, PLASMA    DG Chest Port 1 View  Final Result      Medications  dexamethasone (DECADRON) injection 6 mg (has no administration in time range)     Procedures  /  Critical Care .Critical Care  Date/Time: 03/29/2021 5:03 AM Performed by: Maudie Flakes, MD Authorized by: Maudie Flakes, MD   Critical care provider statement:    Critical care time (minutes):  35   Critical care was necessary to treat or prevent imminent or life-threatening deterioration of the following conditions: COVID-19 with hypoxia.   Critical care was time spent personally by me  on the following activities:  Discussions with consultants, evaluation of patient's response to treatment, examination of patient, ordering and performing treatments and interventions, ordering and review of laboratory studies, ordering and review of radiographic studies, pulse oximetry, re-evaluation of patient's condition, obtaining history from patient or surrogate and review of old charts  ED Course and Medical  Decision Making  I have reviewed the triage vital signs, the nursing notes, and pertinent available records from the EMR.  Listed above are laboratory and imaging tests that I personally ordered, reviewed, and interpreted and then considered in my medical decision making (see below for details).  Tested positive for COVID-19, symptoms started 4 days ago, overall well-appearing, O2 saturations in the low 90s, no increased work of breathing, will obtain a screening EKG and chest x-ray and reassess.     Patient is now hypoxic to 86% on room air, given 2 L nasal cannula with good improvement.  COVID order set obtained, patient is confirmed COVID-positive with some elevated inflammatory markers.  Admitted to medicine for further care.  Barth Kirks. Sedonia Small, MD [place] [email or web address]  Final Clinical Impressions(s) / ED Diagnoses     ICD-10-CM   1. COVID-19  U07.1     2. Shortness of breath  R06.02       ED Discharge Orders     None        Discharge Instructions Discussed with and Provided to Patient:   Discharge Instructions   None       Maudie Flakes, MD 03/29/21 0503    Maudie Flakes, MD 03/29/21 [phone]

## 2021-03-29 NOTE — H&P (Signed)
History and Physical  [name] OVZ:[id number] DOB: [birth year] DOA: 03/29/2021  Referring physician: Maudie Flakes, MD PCP: Sharilyn Sites, MD  Patient coming from: Home  Chief Complaint: Shortness of breath  HPI: Derek [name] is a 80 y.o. male with medical history significant for atrial fibrillation on Eliquis, GERD, CKD stage 3B-4, gout, hyperlipidemia, gout who presents to the emergency department due to 4-day onset of shortness of breath, this was associated with sore throat and cough with production of yellowish sputum.  Home COVID test done on 8/26 was positive, he said that he was exposed to his wife who was also COVID-positive.  He complained of increased weakness, loss of taste and loss of smell.  Patient decided to go to the ED for further evaluation and management due to worsening of his symptoms.  He states that he already obtained COVID vaccines and the booster  ED Course: In the emergency department, he was tachypneic, supplemental oxygen via [place] was provided in the ED.  Work-up in the ED showed normocytic anemia, BUN/creatinine 24/2.44 (baseline creatinine at 1.9) calcium 7.8, CRP 10.1, D-dimer 0.78, fibrinogen 559.  Influenza A, B was negative.  SARS coronavirus 2 was positive. Chest x-ray showed no acute cardiopulmonary process. IV remdesivir and Decadron were given.  Hospitalist was asked to admit patient for further evaluation and management.  Review of Systems: Constitutional: Negative for chills and fever.  HENT: Negative for ear pain and sore throat.   Eyes: Negative for pain and visual disturbance.  Respiratory: Positive for cough and shortness of breath.   Cardiovascular: Negative for chest pain and palpitations.  Gastrointestinal: Negative for abdominal pain and vomiting.  Endocrine: Negative for polyphagia and polyuria.  Genitourinary: Negative for decreased urine volume, dysuria, enuresis Musculoskeletal: Negative for arthralgias and back pain.   Skin: Negative for color change and rash.  Allergic/Immunologic: Negative for immunocompromised state.  Neurological: Negative for tremors, syncope, speech difficulty, weakness, light-headedness and headaches.  Hematological: Does not bruise/bleed easily.  All other systems reviewed and are negative     Past Medical History:  Diagnosis Date   Allergy    Anemia    Anxiety    Aortic insufficiency    a. mild-mod by echo 09/2015.   Arthritis    "knees; left shoulder" (09/21/2013)   Ascending aortic aneurysm (HCC)    a. last measurement 5.3 cm 03/2016 -> f/u planned 09/2015 to continue to follow.   Atrial fibrillation ([place])    B12 deficiency    takes Vit 12 shot every 14days    Bowel perforation ([place]) 08/27/2016   Cataract    CKD (chronic kidney disease) stage 3, GFR 30-59 ml/min (HCC) 08/22/2011   Clotting disorder ([place])    Colon cancer ([place])    Colonic ischemia ([place]) 08/27/2016   Crohn's disease ([place])    Depression    Enlarged prostate    Enteric hyperoxaluria 02/21/2016   GERD (gastroesophageal reflux disease)    takes Omeprazole daily   Gout    takes Uloric and Colchicine daily   Heart murmur    Hepatitis C 1978   negtive RNA load - spontaneously cleared   Hiatal hernia    High output ileostomy ([place]) 06/02/2017   History of blood transfusion 1978; 1990's; ?   "w/bowel resection; S/P allupurinol; ?" (09/21/2013)   History of colon polyps    History of kidney stones    History of MRSA infection 2010   History of pulmonary embolism 2006   both legs and  both lungs [name] 08/26/2008 (09/21/2013)   History of small bowel obstruction    History of staph infection 1978   Hyperoxaluria    Intestinal   Hypertension    Insomnia    takes Trazodone nightly   Internal hemorrhoids    LV dysfunction    a. h/o EF 45-50% in 2015, normalized on subsequent echoes.   Nephrolithiasis    Nocardia infection    Pancreatitis 2010   elevated lipase and amylase, stranding in tail of  pancreas, ? from Humira   Pancytopenia    Hx of   Peripheral neuropathy    takes Gabapentin daily   Pneumonia    hx of    Post-traumatic stress syndrome    takes Paxil nightly   PTSD (post-traumatic stress disorder)    Pulmonary nodule    a. 63m by CT 05/2015, recommended f/u 6-12 months.   RLS (restless legs syndrome)    Rosacea conjunctivitis(372.31)    takes Minocin daily   Secondary hyperparathyroidism ([facility] 02/21/2016   Shingles    Shingles    Sinus bradycardia    Skin cancer    "cut/burned off left ear and face" (09/21/2013)   Small bowel obstruction ([facility]    Status post reversal of ileostomy 07/23/2017   [place]rombocytopenia ([facility]    hx of   Past Surgical History:  Procedure Laterality Date   ANKLE SURGERY Right    AColfaxX 2   CARDIOVERSION N/A 05/29/2016   Procedure: CARDIOVERSION;  Surgeon: MSanda Klein MD;  [facility];  Service: Cardiovascular;  Laterality: N/A;   CHOLECYSTECTOMY     Colon Cancer     COLON RESECTION N/A 08/14/2016   Procedure: LAPAROSCOPIC RESECTION TRANSVERSE COLON;  Surgeon: DAlphonsa Overall MD;  Location: [facility];  Service: General;  Laterality: N/A;   COLON SURGERY     COLONOSCOPY     ESOPHAGOGASTRODUODENOSCOPY     EYE SURGERY     cataract surgery bilateral   FOOT SURGERY Right    "took gout out"   HEMICOLECTOMY Right    ILEOCECETOMY  1978   /[name]10/03/2000  (09/21/2013)   ILEOSTOMY     ILEOSTOMY CLOSURE N/A 07/23/2017   Procedure: ILEOSTOMY REVERSAL ;  Surgeon: NAlphonsa Overall MD;  Location: [facility];  Service: General;  Laterality: N/A;   INGUINAL HERNIA REPAIR Right    IR FLUORO GUIDE CV LINE RIGHT  05/07/2017   IR REMOVAL TUN CV CATH W/O FL  10/08/2017   IR [place] ACCESS RIGHT  05/07/2017   KNEE ARTHROSCOPY Left    LAPAROTOMY N/A 08/27/2016   Procedure: EXPLORATORYLAPAROTOMY, LYSIS OF ADHESIONS, ILEOSTOMY, RIGHT COLECTOMY;  Surgeon: DAlphonsa Overall MD;  Location: [facility];  Service:  General;  Laterality: N/A;   LIGAMENT REPAIR Left    POLYPECTOMY     TEE WITHOUT CARDIOVERSION N/A 05/29/2016   Procedure: TRANSESOPHAGEAL ECHOCARDIOGRAM (TEE);  Surgeon: MSanda Klein MD;  [facility]  Service: Cardiovascular;  Laterality: N/A;   TOTAL SHOULDER ARTHROPLASTY Left 09/21/2013   TOTAL SHOULDER ARTHROPLASTY Left 09/21/2013   Procedure: LEFT TOTAL SHOULDER ARTHROPLASTY;  Surgeon: KMarin Shutter MD;  Location: [facility]  Service: Orthopedics;  Laterality: Left;    Social History:  reports that he quit smoking about 45 years ago. His smoking use included cigarettes. He has a 40.00 pack-year smoking history. He quit smokeless tobacco use about 42 years ago.  His smokeless tobacco use included chew. He reports that he  does not drink alcohol and does not use drugs.   Allergies  Allergen Reactions   Lorazepam Other (See Comments)    Reaction:  Hallucinations    Humira [Adalimumab] Other (See Comments)    Pt states that he got pancreatitis.     Calcium D-Glucarate [Calcium Saccharate]    Quinolones     Patient was warned about not using Cipro and similar antibiotics. Recent studies have raised concern that fluoroquinolone antibiotics could be associated with an increased risk of aortic aneurysm Fluoroquinolones have non-antimicrobial properties that might jeopardise the integrity of the extracellular matrix of the vascular wall In a  propensity score matched cohort study in [place], there was a 66% increased rate of aortic aneurysm or dissection associated with oral fluoroquinolone use, compared wit    Family History  Problem Relation Age of Onset   Kidney disease Father    Hypertension Father    Aneurysm Mother    Aneurysm Sister    Esophageal cancer Neg Hx    Stomach cancer Neg Hx    Rectal cancer Neg Hx    Colon cancer Neg Hx      Prior to Admission medications   Medication Sig Start Date End Date Taking? Authorizing Provider  Acidophilus Lactobacillus CAPS  Take 1 capsule by mouth daily. 06/08/16   [name], MD  amiodarone (PACERONE) 200 MG tablet Take 1 tablet (200 mg total) by mouth daily. 10/30/20   Jerline Pain, MD  calcium carbonate (TUMS - DOSED IN MG ELEMENTAL CALCIUM) 500 MG chewable tablet Chew 2 tablets by mouth 2 (two) times daily.    [provider]  Cholecalciferol (VITAMIN D) 2000 units CAPS Take 2,000 Units by mouth daily.    [provider]  cyanocobalamin (,VITAMIN B-12,) 1000 MCG/ML injection Inject 1,000 mcg into the muscle. THREE TIMES A MONTH    [provider]  doxycycline (VIBRAMYCIN) 100 MG capsule Take 1 capsule (100 mg total) by mouth 2 (two) times daily. One po bid x 7 days 03/27/21   Milton Ferguson, MD  ELIQUIS 5 MG TABS tablet Take 1 tablet (5 mg total) by mouth 2 (two) times daily. 11/05/16   End, Harrell Gave, MD  famotidine (PEPCID) 20 MG tablet Take 20 mg by mouth daily.    [provider]  febuxostat (ULORIC) 40 MG tablet Take 40 mg by mouth daily.    [provider]  fluticasone (CUTIVATE) 0.05 % cream Apply topically 2 (two) times daily. 02/24/21   [provider]  gabapentin (NEURONTIN) 100 MG capsule Take one (1) capsules (100 mg) by mouth each morning and 43m at bedtime.    [provider]  HYDROcodone-acetaminophen (NORCO) 10-325 MG tablet Take 1-2 tablets by mouth 4 (four) times daily as needed. 12/05/19   [provider]  levothyroxine (SYNTHROID) 50 MCG tablet Take 50 mcg by mouth daily before breakfast.    [provider]  lidocaine (LIDODERM) 5 % Place 1 patch onto the skin daily. Remove & Discard patch within 12 hours or as directed by MD 04/02/20   HMargarita Mail PA-C  magnesium oxide (MAG-OX) 400 (241.3 Mg) MG tablet Take 1,200 mg by mouth 2 (two) times daily.     [provider]  Multiple Vitamin (MULTIVITAMIN WITH MINERALS) TABS tablet Take 1 tablet by mouth daily.    [provider]  PARoxetine (PAXIL)  20 MG tablet Take 20 mg by mouth daily.     [provider]  rosuvastatin (CRESTOR) 10 MG  tablet Take 1 tablet (10 mg total) by mouth daily. 10/17/20   Jerline Pain, MD  sodium bicarbonate 650 MG tablet Take 1,300 mg by mouth 2 (two) times daily.     [provider]  sucralfate (CARAFATE) 1 GM/10ML suspension Take 10 mLs (1 g total) by mouth 4 (four) times daily as needed. 03/03/21   [name], MD  traZODone (DESYREL) 100 MG tablet Take 100 mg by mouth at bedtime.    [provider]  valACYclovir (VALTREX) 1000 MG tablet Take 1,000 mg by mouth 3 (three) times daily. 02/24/21   [provider]  Calcium Carbonate (CALCIUM 500 PO) Take 2 tablets by mouth 2 (two) times daily.   05/11/16  [provider]    Physical Exam: BP 110/61   Pulse 65   Temp 98.8 F (37.1 C) (Oral)   Resp (!) 30   SpO2 95%   General: 80 y.o. year-old male well developed, ill-appearing but in no acute distress.  Alert and oriented x3. HEENT: NCAT, EOMI Neck: Supple, trachea medial Cardiovascular: Regular rate and rhythm with no rubs or gallops.  No thyromegaly or JVD noted.  No lower extremity edema. 2/4 pulses in all 4 extremities. Respiratory: Clear to auscultation with no wheezes or rales. Good inspiratory effort. Abdomen: Soft, nontender nondistended with normal bowel sounds x4 quadrants. Muskuloskeletal: No cyanosis, clubbing or edema noted bilaterally Neuro: CN II-XII intact, strength 5/5 x 4, sensation, reflexes intact Skin: No ulcerative lesions noted or rashes Psychiatry: Mood is appropriate for condition and setting          Labs on Admission:  Basic Metabolic Panel: Recent Labs  Lab 03/27/21 0958 03/29/21 0223  NA 137 133*  K 5.1 4.3  CL 101 101  CO2 27 25  GLUCOSE 110* 119*  BUN 15 24*  CREATININE 1.94* 2.44*  CALCIUM 8.8* 7.8*   Liver Function Tests: Recent Labs  Lab 03/27/21 0958 03/29/21 0223  AST 43* 28  ALT 54* 34  ALKPHOS 126 126   BILITOT 1.1 1.0  PROT 6.8 7.0  ALBUMIN 3.8 3.7   No results for input(s): LIPASE, AMYLASE in the last 168 hours. No results for input(s): AMMONIA in the last 168 hours. CBC: Recent Labs  Lab 03/27/21 0958 03/29/21 0223  WBC 5.2 4.8  NEUTROABS 4.1 3.5  HGB 12.5* 12.0*  HCT 38.2* 36.2*  MCV 92.5 92.3  PLT 101* 98*   Cardiac Enzymes: No results for input(s): CKTOTAL, CKMB, CKMBINDEX, TROPONINI in the last 168 hours.  BNP (last 3 results) No results for input(s): BNP in the last 8760 hours.  ProBNP (last 3 results) No results for input(s): PROBNP in the last 8760 hours.  CBG: No results for input(s): GLUCAP in the last 168 hours.  Radiological Exams on Admission: CT ABDOMEN PELVIS WO CONTRAST  Result Date: 03/27/2021 CLINICAL DATA:  Bulge in the mid abdomen with nausea for 2-3 days EXAM: CT ABDOMEN AND PELVIS WITHOUT CONTRAST TECHNIQUE: Multidetector CT imaging of the abdomen and pelvis was performed following the standard protocol without IV contrast. COMPARISON:  CT abdomen/pelvis 02/17/2021 FINDINGS: Lower chest: There is a 5 mm nodule in the left lower lobe, unchanged. Right coronary artery calcifications are again seen. There is no pericardial effusion. Hepatobiliary: A few scattered punctate calcified granulomas in the liver are unchanged. There are no new lesions, within the confines of noncontrast technique. The gallbladder is surgically absent. There is no biliary ductal dilatation. Pancreas: Fatty atrophy of the pancreatic head  is unchanged. There is no focal lesion or contour abnormality. There is no main pancreatic ductal dilatation or peripancreatic inflammatory change. Spleen: The spleen is enlarged measuring up to 15.0 cm cc. Adrenals/Urinary Tract: The adrenals are unremarkable. The kidneys again demonstrate lobular surface contours bilaterally. Multiple hypodense lesions are again seen in each kidney measuring up to 2.7 cm arising from the right lower pole. These are  incompletely characterized, most likely reflects cysts. Multiple nonobstructing renal calculi are again seen on the right. There is no hydronephrosis or hydroureter. The bladder is unremarkable. Stomach/Bowel: The stomach is unremarkable. The patient is status post right colectomy. There is no evidence of complication at the anastomotic site. There are multiple mildly fluid distended loops of small and large bowel throughout the abdomen without evidence of mechanical obstruction. There is no abnormal bowel wall thickening or inflammatory change. Some loops of small bowel are closely apposed to the ventral abdominal wall in a similar configuration to the prior study, raising suspicion for adhesion. Vascular/Lymphatic: There is extensive vascular calcification throughout the abdomen. The aorta is not aneurysmal. Reproductive: The prostate and seminal vesicles are unremarkable. Other: There is no ascites or free air. Soft tissue thickening in the region of the umbilicus measuring approximately 2.9 cm by 1.9 cm is unchanged. There is no evidence of fluid collection at this site. Musculoskeletal: There is mild degenerative change of the lumbar spine. There is no acute osseous abnormality. IMPRESSION: 1. Status post right colectomy without evidence of complication at the anastomotic site. 2. Multiple mildly fluid distended loops of small and large bowel throughout the abdomen without evidence of mechanical obstruction. This may reflect diarrhea. 3. Some loops of small bowel are closely apposed to the anterior abdominal wall in a similar configuration to the prior study, raising suspicion for adhesion/tethering. 4. Focal area of soft tissue thickening in the region of the umbilicus is unchanged compared to the prior study and likely reflects scar. 5. Nonobstructing right renal calculi. 6. Splenomegaly. 7.  Aortic Atherosclerosis (ICD10-I70.0). Electronically Signed   By: Valetta Mole M.D.   On: 03/27/2021 12:39   DG  Chest Port 1 View  Result Date: 03/29/2021 CLINICAL DATA:  Shortness of breath. EXAM: PORTABLE CHEST 1 VIEW COMPARISON:  Chest radiograph dated 04/04/2020 and CT dated 11/21/2020. FINDINGS: No focal consolidation, pleural effusion or pneumothorax. Mild cardiomegaly. Atherosclerotic calcification of the aorta. Degenerative changes of spine. Left shoulder hemiarthroplasty. Acute osseous pathology. IMPRESSION: No acute cardiopulmonary process. Electronically Signed   By: Anner Crete M.D.   On: 03/29/2021 02:08    EKG: I independently viewed the EKG done and my findings are as followed: Normal sinus rhythm at a rate of 71 bpm  Assessment/Plan Present on Admission:  COVID-19 virus infection  GERD  Gout  Principal Problem:   COVID-19 virus infection Active Problems:   Gout   GERD   Acute kidney injury superimposed on CKD (HCC)   Hypocalcemia   Atrial fibrillation, chronic (HCC)   Hyperlipidemia   Hypothyroidism   Nausea   Generalized weakness in the setting of COVID-19 virus infection Continue albuterol q.6h V Decadron 6 mg daily was given in the ED Continue IV Remdesivir per pharmacy protocol Continue vitamin-C 500 mg p.o. Daily Continue zinc 220 mg p.o. Daily Continue Mucinex, Robitussin and Tussionex Continue Tylenol p.r.n. for fever Continue supplemental oxygen to maintain O2 sat > or = 94% with plan to wean patient off supplemental oxygen as tolerated (of note, patient does not use oxygen at baseline) Continue  incentive spirometry and flutter valve q98mn as tolerated Encourage proning, early ambulation, and side laying as tolerated Continue contact and airborne isolation precaution Inflammatory markers: LDH: 135 CRP:   10.1  D-dimer: 0.78 Ferritin: 136  Continue monitoring daily inflammatory markers Physician PPE:  Surgical mask with face shield, N-95, nonsterile gloves, disposable gown, head and shoe covers Patient PPE:  Face mask  Nausea Continue Zofran as  needed  Acute kidney injury on CKD stage IV BUN/creatinine 24/2.44 (baseline creatinine at 1.9) Renally adjust medications, avoid nephrotoxic agents/dehydration/hypotension  Hypocalcemia Continue calcium carbonate  Atrial fibrillation chronic Continue amiodarone and Eliquis  Hyperlipidemia Continue Crestor  GERD Continue Pepcid  Gout Continue Uloric  Hypothyroidism Continue Synthroid   DVT prophylaxis: Eliquis  Code Status: Full code  Family Communication: None at bedside  Disposition Plan:  Patient is from:                        home Anticipated DC to:                   SNF or family members home Anticipated DC date:               2-3 days Anticipated DC barriers:          Patient requires inpatient management due to COVID-19 virus infection with increased shortness of breath and weakness   Consults called: None  Admission status: Observation    OBernadette HoitMD [facility]  03/29/2021, 6:00 AM

## 2021-03-29 NOTE — Progress Notes (Signed)
PT arrived tdo room #339 from ED via stretcher. Pt able to ambulate with only standby assist from stretcher to bed. Denies any c/o pain or SOB at present, states only feels weak. Has congested, occasionally productive cough, expectorating thin clear sputum. IVF infusing without s/s infiltration. O2 @ 3lpm [place]. Pt oriented to room and safety procedures, states understanding.

## 2021-03-30 DIAGNOSIS — E869 Volume depletion, unspecified: Secondary | ICD-10-CM | POA: Diagnosis present

## 2021-03-30 DIAGNOSIS — I482 Chronic atrial fibrillation, unspecified: Secondary | ICD-10-CM

## 2021-03-30 DIAGNOSIS — G629 Polyneuropathy, unspecified: Secondary | ICD-10-CM | POA: Diagnosis present

## 2021-03-30 DIAGNOSIS — G4700 Insomnia, unspecified: Secondary | ICD-10-CM | POA: Diagnosis present

## 2021-03-30 DIAGNOSIS — L03311 Cellulitis of abdominal wall: Secondary | ICD-10-CM | POA: Diagnosis present

## 2021-03-30 DIAGNOSIS — D696 Thrombocytopenia, unspecified: Secondary | ICD-10-CM | POA: Diagnosis not present

## 2021-03-30 DIAGNOSIS — K5090 Crohn's disease, unspecified, without complications: Secondary | ICD-10-CM | POA: Diagnosis present

## 2021-03-30 DIAGNOSIS — E039 Hypothyroidism, unspecified: Secondary | ICD-10-CM | POA: Diagnosis present

## 2021-03-30 DIAGNOSIS — N179 Acute kidney failure, unspecified: Secondary | ICD-10-CM | POA: Diagnosis present

## 2021-03-30 DIAGNOSIS — R0602 Shortness of breath: Secondary | ICD-10-CM | POA: Diagnosis not present

## 2021-03-30 DIAGNOSIS — U071 COVID-19: Secondary | ICD-10-CM | POA: Diagnosis present

## 2021-03-30 DIAGNOSIS — K219 Gastro-esophageal reflux disease without esophagitis: Secondary | ICD-10-CM | POA: Diagnosis present

## 2021-03-30 DIAGNOSIS — G2581 Restless legs syndrome: Secondary | ICD-10-CM | POA: Diagnosis present

## 2021-03-30 DIAGNOSIS — F4310 Post-traumatic stress disorder, unspecified: Secondary | ICD-10-CM | POA: Diagnosis present

## 2021-03-30 DIAGNOSIS — J9601 Acute respiratory failure with hypoxia: Secondary | ICD-10-CM

## 2021-03-30 DIAGNOSIS — N1832 Chronic kidney disease, stage 3b: Secondary | ICD-10-CM | POA: Diagnosis not present

## 2021-03-30 DIAGNOSIS — E538 Deficiency of other specified B group vitamins: Secondary | ICD-10-CM | POA: Diagnosis present

## 2021-03-30 DIAGNOSIS — N184 Chronic kidney disease, stage 4 (severe): Secondary | ICD-10-CM | POA: Diagnosis present

## 2021-03-30 DIAGNOSIS — M109 Gout, unspecified: Secondary | ICD-10-CM | POA: Diagnosis present

## 2021-03-30 DIAGNOSIS — I712 Thoracic aortic aneurysm, without rupture: Secondary | ICD-10-CM | POA: Diagnosis present

## 2021-03-30 DIAGNOSIS — J1282 Pneumonia due to coronavirus disease 2019: Secondary | ICD-10-CM | POA: Diagnosis present

## 2021-03-30 DIAGNOSIS — E785 Hyperlipidemia, unspecified: Secondary | ICD-10-CM | POA: Diagnosis present

## 2021-03-30 DIAGNOSIS — I480 Paroxysmal atrial fibrillation: Secondary | ICD-10-CM | POA: Diagnosis present

## 2021-03-30 DIAGNOSIS — I129 Hypertensive chronic kidney disease with stage 1 through stage 4 chronic kidney disease, or unspecified chronic kidney disease: Secondary | ICD-10-CM | POA: Diagnosis present

## 2021-03-30 DIAGNOSIS — I2510 Atherosclerotic heart disease of native coronary artery without angina pectoris: Secondary | ICD-10-CM | POA: Diagnosis present

## 2021-03-30 DIAGNOSIS — I428 Other cardiomyopathies: Secondary | ICD-10-CM | POA: Diagnosis present

## 2021-03-30 LAB — CBC WITH DIFFERENTIAL/PLATELET
Abs Immature Granulocytes: 0.03 10*3/uL (ref 0.00–0.07)
Basophils Absolute: 0 10*3/uL (ref 0.0–0.1)
Basophils Relative: 0 %
Eosinophils Absolute: 0 10*3/uL (ref 0.0–0.5)
Eosinophils Relative: 0 %
HCT: 34.2 % — ABNORMAL LOW (ref 39.0–52.0)
Hemoglobin: 11 g/dL — ABNORMAL LOW (ref 13.0–17.0)
Immature Granulocytes: 1 %
Lymphocytes Relative: 5 %
Lymphs Abs: 0.3 10*3/uL — ABNORMAL LOW (ref 0.7–4.0)
MCH: 29.8 pg (ref 26.0–34.0)
MCHC: 32.2 g/dL (ref 30.0–36.0)
MCV: 92.7 fL (ref 80.0–100.0)
Monocytes Absolute: 0.3 10*3/uL (ref 0.1–1.0)
Monocytes Relative: 5 %
Neutro Abs: 5.4 10*3/uL (ref 1.7–7.7)
Neutrophils Relative %: 89 %
Platelets: 109 10*3/uL — ABNORMAL LOW (ref 150–400)
RBC: 3.69 MIL/uL — ABNORMAL LOW (ref 4.22–5.81)
RDW: 16 % — ABNORMAL HIGH (ref 11.5–15.5)
WBC: 6 10*3/uL (ref 4.0–10.5)
nRBC: 0 % (ref 0.0–0.2)

## 2021-03-30 LAB — COMPREHENSIVE METABOLIC PANEL
ALT: 26 U/L (ref 0–44)
AST: 25 U/L (ref 15–41)
Albumin: 3.1 g/dL — ABNORMAL LOW (ref 3.5–5.0)
Alkaline Phosphatase: 92 U/L (ref 38–126)
Anion gap: 8 (ref 5–15)
BUN: 36 mg/dL — ABNORMAL HIGH (ref 8–23)
CO2: 22 mmol/L (ref 22–32)
Calcium: 8.6 mg/dL — ABNORMAL LOW (ref 8.9–10.3)
Chloride: 107 mmol/L (ref 98–111)
Creatinine, Ser: 1.99 mg/dL — ABNORMAL HIGH (ref 0.61–1.24)
GFR, Estimated: 34 mL/min — ABNORMAL LOW (ref >60)
Glucose, Bld: 148 mg/dL — ABNORMAL HIGH (ref 70–99)
Potassium: 4.4 mmol/L (ref 3.5–5.1)
Sodium: 137 mmol/L (ref 135–145)
Total Bilirubin: 0.5 mg/dL (ref 0.3–1.2)
Total Protein: 5.9 g/dL — ABNORMAL LOW (ref 6.5–8.1)

## 2021-03-30 LAB — D-DIMER, QUANTITATIVE: D-Dimer, Quant: 0.53 ug/mL-FEU — ABNORMAL HIGH (ref 0.00–0.50)

## 2021-03-30 LAB — C-REACTIVE PROTEIN: CRP: 6.4 mg/dL — ABNORMAL HIGH (ref <1.0)

## 2021-03-30 LAB — PHOSPHORUS: Phosphorus: 4.2 mg/dL (ref 2.5–4.6)

## 2021-03-30 LAB — FERRITIN: Ferritin: 158 ng/mL (ref 24–336)

## 2021-03-30 LAB — MAGNESIUM: Magnesium: 1.8 mg/dL (ref 1.7–2.4)

## 2021-03-30 MED ORDER — ALBUTEROL SULFATE HFA 108 (90 BASE) MCG/ACT IN AERS: 2.0000 | INHALATION_SPRAY | RESPIRATORY_TRACT | Status: DC | PRN

## 2021-03-30 MED ORDER — CEFAZOLIN SODIUM-DEXTROSE 2-4 GM/100ML-% IV SOLN
2.0000 g | Freq: Two times a day (BID) | INTRAVENOUS | Status: DC
  Administered 2021-03-30 – 2021-03-31 (×3): 2 g via INTRAVENOUS
  Filled 2021-03-30 (×3): qty 100

## 2021-03-30 MED ORDER — SODIUM CHLORIDE 0.9 % IV SOLN: INTRAVENOUS | Status: AC

## 2021-03-30 NOTE — Discharge Summary (Signed)
Physician Discharge Summary  MELQUAN ERNSBERGER CNO:[id number] DOB: [birth year] DOA: 03/29/2021  PCP: Sharilyn Sites, MD  Admit date: 03/29/2021 Discharge date: 03/31/2021  Admitted From: Home Disposition:  Home   Recommendations for Outpatient Follow-up:  Follow up with PCP in 1-2 weeks Please obtain BMP/CBC in one week     Discharge Condition: Stable CODE STATUS:FULL Diet recommendation: Heart Healthy    Brief/Interim Summary: 80 year old male with a history of atrial fibrillation on apixaban, CKD stage IIIb, hyperlipidemia, B12 deficiency, nonischemic cardiomyopathy,aortic root aneurysm, Crohn's disease with prior colectomy, colon cancer, remote PE  peripheral neuropathy presenting with coughing and shortness of breath that began on 03/25/2021.  Patient states that his wife has been COVID-positive.  Because of his symptoms, he used a home test that was positive on 03/28/2021.  He stated that his shortness of breath and coughing have progressively worsened.  He has had some subjective fevers and chills.  He had 1 episode of nausea and vomiting without any blood.  He denies any hemoptysis but is coughing up yellow sputum.  He denies any headache, neck pain, worsening abdominal pain, diarrhea, hematochezia, melena, dysuria, hematuria.  He has been vaccinated x3. Notably, the patient visited the emergency department on 03/27/2021 because of abdominal pain.  CT of the abdomen and pelvis at that time showed some loops of small bowel that were closely opposed to the anterior abdominal wall and configuration which was essentially unchanged attributable to possible adhesions or tethering.  There was some focal soft tissue thickening of the umbilicus area that was unchanged.  The patient was seen by general surgery, Dr. Constance Haw.  The patient was discharged home in stable condition for cellulitis of his abdominal wall.  He has been taking his doxycycline.  He states that his abdominal pain is improving.   His last bowel movement was in the emergency department.  He is passing flatus. In the emergency department, the patient had an oxygen saturation of 86% on room air.  He was placed on 2 L with saturation up to 95%.  The patient was hemodynamically stable.  BMP showed a sodium 133, potassium 4.3, serum creatinine 2.44.  LFTs were unremarkable.  WBC 14.8, hemoglobin 12.0, platelets 98,000.  The patient was given a dose of dexamethasone and started on remdesivir in the ED.  Discharge Diagnoses:   Acute respiratory failure with hypoxia secondary to COVID-19 -Stable on 2 L nasal cannula>>weaned to RA -CRP 10.1>>6.4>>2.5 -Ferritin 136>>158>>177 -D-dimer 0.78>>0.53 -PCT 0.48>>0.11 -Continue IV remdesivir--received 3 days -continue IV steroids>>d/c home with prednisone x 7 more days -Personally reviewed chest x-ray--no infiltrates or edema   Abdominal wall cellulitis -initially on doxycycline -started  cefazolin>>d/c home with cephalexin x 6 more days -improving with cefazolin   Acute on chronic renal failure--CKD stage IIIb -Baseline creatinine 1.8-1.9 -Secondary to volume depletion -continue judicious IV fluids -Presented with serum creatinine 2.44 -serum creatinine 1.83 on day of d/c   Paroxysmal atrial fibrillation -Continue amiodarone  -Continue apixaban -Status post cardioversion 05/29/2016 -Remains in sinus rhythm   Thoracic aortic aneurysm -Patient follows up with cardiothoracic surgery, Dr. Orvan Seen -Noted to be 5 cm on previous CT   Nonischemic cardiomyopathy -Has had normalization of his EF -Follows Dr. Marlou Porch -09/06/2019 echo EF 60 to 65%   Hyperlipidemia -Continue statin   Hypothyroid -Continue Synthroid   Coronary artery disease -No chest pain presently -10/25/2020 Myoview low risk -continue statin  Discharge Instructions   Allergies as of 03/31/2021       Reactions  Lorazepam Other (See Comments)   Reaction:  Hallucinations    Humira [adalimumab] Other  (See Comments)   Pt states that he got pancreatitis.     Calcium D-glucarate [calcium Saccharate]    Quinolones    Patient was warned about not using Cipro and similar antibiotics. Recent studies have raised concern that fluoroquinolone antibiotics could be associated with an increased risk of aortic aneurysm Fluoroquinolones have non-antimicrobial properties that might jeopardise the integrity of the extracellular matrix of the vascular wall In a  propensity score matched cohort study in [place], there was a 66% increased rate of aortic aneurysm or dissection associated with oral fluoroquinolone use, compared wit        Medication List     STOP taking these medications    doxycycline 100 MG capsule Commonly known as: VIBRAMYCIN   levothyroxine 50 MCG tablet Commonly known as: SYNTHROID   valACYclovir 1000 MG tablet Commonly known as: VALTREX       TAKE these medications    Acidophilus Lactobacillus Caps Take 1 capsule by mouth daily.   amiodarone 200 MG tablet Commonly known as: PACERONE Take 1 tablet (200 mg total) by mouth daily.   calcium carbonate 500 MG chewable tablet Commonly known as: TUMS - dosed in mg elemental calcium Chew 2 tablets by mouth 2 (two) times daily.   cephALEXin 500 MG capsule Commonly known as: KEFLEX Take 1 capsule (500 mg total) by mouth 3 (three) times daily.   cyanocobalamin 1000 MCG/ML injection Commonly known as: (VITAMIN B-12) Inject 1,000 mcg into the muscle. THREE TIMES A MONTH   Eliquis 5 MG Tabs tablet Generic drug: apixaban Take 1 tablet (5 mg total) by mouth 2 (two) times daily.   famotidine 20 MG tablet Commonly known as: PEPCID Take 20 mg by mouth daily.   febuxostat 40 MG tablet Commonly known as: ULORIC Take 40 mg by mouth daily.   fluticasone 0.05 % cream Commonly known as: CUTIVATE Apply topically 2 (two) times daily.   HYDROcodone-acetaminophen 10-325 MG tablet Commonly known as: NORCO Take 1-2 tablets by  mouth 4 (four) times daily as needed.   levocetirizine 5 MG tablet Commonly known as: XYZAL Take 1 tablet by mouth every evening.   lidocaine 5 % Commonly known as: Lidoderm Place 1 patch onto the skin daily. Remove & Discard patch within 12 hours or as directed by MD   magnesium oxide 400 (241.3 Mg) MG tablet Commonly known as: MAG-OX Take 1,200 mg by mouth 2 (two) times daily.   multivitamin with minerals Tabs tablet Take 1 tablet by mouth daily.   PARoxetine 20 MG tablet Commonly known as: PAXIL Take 20 mg by mouth daily.   predniSONE 50 MG tablet Commonly known as: DELTASONE Take 1 tablet (50 mg total) by mouth daily with breakfast.   rosuvastatin 10 MG tablet Commonly known as: Crestor Take 1 tablet (10 mg total) by mouth daily.   sodium bicarbonate 650 MG tablet Take 1,300 mg by mouth 2 (two) times daily.   sucralfate 1 GM/10ML suspension Commonly known as: CARAFATE Take 10 mLs (1 g total) by mouth 4 (four) times daily as needed.   traZODone 100 MG tablet Commonly known as: DESYREL Take 100 mg by mouth at bedtime.   Vitamin D 50 MCG (2000 UT) Caps Take 2,000 Units by mouth daily.        Allergies  Allergen Reactions   Lorazepam Other (See Comments)    Reaction:  Hallucinations    Humira [Adalimumab]  Other (See Comments)    Pt states that he got pancreatitis.     Calcium D-Glucarate [Calcium Saccharate]    Quinolones     Patient was warned about not using Cipro and similar antibiotics. Recent studies have raised concern that fluoroquinolone antibiotics could be associated with an increased risk of aortic aneurysm Fluoroquinolones have non-antimicrobial properties that might jeopardise the integrity of the extracellular matrix of the vascular wall In a  propensity score matched cohort study in [place], there was a 66% increased rate of aortic aneurysm or dissection associated with oral fluoroquinolone use, compared wit     Consultations: none   Procedures/Studies: CT ABDOMEN PELVIS WO CONTRAST  Result Date: 03/27/2021 CLINICAL DATA:  Bulge in the mid abdomen with nausea for 2-3 days EXAM: CT ABDOMEN AND PELVIS WITHOUT CONTRAST TECHNIQUE: Multidetector CT imaging of the abdomen and pelvis was performed following the standard protocol without IV contrast. COMPARISON:  CT abdomen/pelvis 02/17/2021 FINDINGS: Lower chest: There is a 5 mm nodule in the left lower lobe, unchanged. Right coronary artery calcifications are again seen. There is no pericardial effusion. Hepatobiliary: A few scattered punctate calcified granulomas in the liver are unchanged. There are no new lesions, within the confines of noncontrast technique. The gallbladder is surgically absent. There is no biliary ductal dilatation. Pancreas: Fatty atrophy of the pancreatic head is unchanged. There is no focal lesion or contour abnormality. There is no main pancreatic ductal dilatation or peripancreatic inflammatory change. Spleen: The spleen is enlarged measuring up to 15.0 cm cc. Adrenals/Urinary Tract: The adrenals are unremarkable. The kidneys again demonstrate lobular surface contours bilaterally. Multiple hypodense lesions are again seen in each kidney measuring up to 2.7 cm arising from the right lower pole. These are incompletely characterized, most likely reflects cysts. Multiple nonobstructing renal calculi are again seen on the right. There is no hydronephrosis or hydroureter. The bladder is unremarkable. Stomach/Bowel: The stomach is unremarkable. The patient is status post right colectomy. There is no evidence of complication at the anastomotic site. There are multiple mildly fluid distended loops of small and large bowel throughout the abdomen without evidence of mechanical obstruction. There is no abnormal bowel wall thickening or inflammatory change. Some loops of small bowel are closely apposed to the ventral abdominal wall in a similar  configuration to the prior study, raising suspicion for adhesion. Vascular/Lymphatic: There is extensive vascular calcification throughout the abdomen. The aorta is not aneurysmal. Reproductive: The prostate and seminal vesicles are unremarkable. Other: There is no ascites or free air. Soft tissue thickening in the region of the umbilicus measuring approximately 2.9 cm by 1.9 cm is unchanged. There is no evidence of fluid collection at this site. Musculoskeletal: There is mild degenerative change of the lumbar spine. There is no acute osseous abnormality. IMPRESSION: 1. Status post right colectomy without evidence of complication at the anastomotic site. 2. Multiple mildly fluid distended loops of small and large bowel throughout the abdomen without evidence of mechanical obstruction. This may reflect diarrhea. 3. Some loops of small bowel are closely apposed to the anterior abdominal wall in a similar configuration to the prior study, raising suspicion for adhesion/tethering. 4. Focal area of soft tissue thickening in the region of the umbilicus is unchanged compared to the prior study and likely reflects scar. 5. Nonobstructing right renal calculi. 6. Splenomegaly. 7.  Aortic Atherosclerosis (ICD10-I70.0). Electronically Signed   By: Valetta Mole M.D.   On: 03/27/2021 12:39   DG Chest Port 1 View  Result Date: 03/29/2021  CLINICAL DATA:  Shortness of breath. EXAM: PORTABLE CHEST 1 VIEW COMPARISON:  Chest radiograph dated 04/04/2020 and CT dated 11/21/2020. FINDINGS: No focal consolidation, pleural effusion or pneumothorax. Mild cardiomegaly. Atherosclerotic calcification of the aorta. Degenerative changes of spine. Left shoulder hemiarthroplasty. Acute osseous pathology. IMPRESSION: No acute cardiopulmonary process. Electronically Signed   By: Anner Crete M.D.   On: 03/29/2021 02:08        Discharge Exam: Vitals:   03/30/21 2110 03/31/21 0503  BP: (!) 136/94 133/66  Pulse: (!) 58 (!) 56  Resp:  18 19  Temp: 97.6 F (36.4 C) 97.8 F (36.6 C)  SpO2: 96% 100%   Vitals:   03/30/21 0741 03/30/21 1456 03/30/21 2110 03/31/21 0503  BP:  110/66 (!) 136/94 133/66  Pulse:  60 (!) 58 (!) 56  Resp:  19 18 19   Temp:  98.1 F (36.7 C) 97.6 F (36.4 C) 97.8 F (36.6 C)  TempSrc:  Oral Oral   SpO2: 100% 98% 96% 100%  Weight:      Height:        General: Pt is alert, awake, not in acute distress Cardiovascular: RRR, S1/S2 +, no rubs, no gallops Respiratory: bibasilar rales. No wheeze Abdominal: Soft, NT, ND, bowel sounds + Extremities: no edema, no cyanosis   The results of significant diagnostics from this hospitalization (including imaging, microbiology, ancillary and laboratory) are listed below for reference.    Significant Diagnostic Studies: CT ABDOMEN PELVIS WO CONTRAST  Result Date: 03/27/2021 CLINICAL DATA:  Bulge in the mid abdomen with nausea for 2-3 days EXAM: CT ABDOMEN AND PELVIS WITHOUT CONTRAST TECHNIQUE: Multidetector CT imaging of the abdomen and pelvis was performed following the standard protocol without IV contrast. COMPARISON:  CT abdomen/pelvis 02/17/2021 FINDINGS: Lower chest: There is a 5 mm nodule in the left lower lobe, unchanged. Right coronary artery calcifications are again seen. There is no pericardial effusion. Hepatobiliary: A few scattered punctate calcified granulomas in the liver are unchanged. There are no new lesions, within the confines of noncontrast technique. The gallbladder is surgically absent. There is no biliary ductal dilatation. Pancreas: Fatty atrophy of the pancreatic head is unchanged. There is no focal lesion or contour abnormality. There is no main pancreatic ductal dilatation or peripancreatic inflammatory change. Spleen: The spleen is enlarged measuring up to 15.0 cm cc. Adrenals/Urinary Tract: The adrenals are unremarkable. The kidneys again demonstrate lobular surface contours bilaterally. Multiple hypodense lesions are again seen in  each kidney measuring up to 2.7 cm arising from the right lower pole. These are incompletely characterized, most likely reflects cysts. Multiple nonobstructing renal calculi are again seen on the right. There is no hydronephrosis or hydroureter. The bladder is unremarkable. Stomach/Bowel: The stomach is unremarkable. The patient is status post right colectomy. There is no evidence of complication at the anastomotic site. There are multiple mildly fluid distended loops of small and large bowel throughout the abdomen without evidence of mechanical obstruction. There is no abnormal bowel wall thickening or inflammatory change. Some loops of small bowel are closely apposed to the ventral abdominal wall in a similar configuration to the prior study, raising suspicion for adhesion. Vascular/Lymphatic: There is extensive vascular calcification throughout the abdomen. The aorta is not aneurysmal. Reproductive: The prostate and seminal vesicles are unremarkable. Other: There is no ascites or free air. Soft tissue thickening in the region of the umbilicus measuring approximately 2.9 cm by 1.9 cm is unchanged. There is no evidence of fluid collection at this site. Musculoskeletal: There  is mild degenerative change of the lumbar spine. There is no acute osseous abnormality. IMPRESSION: 1. Status post right colectomy without evidence of complication at the anastomotic site. 2. Multiple mildly fluid distended loops of small and large bowel throughout the abdomen without evidence of mechanical obstruction. This may reflect diarrhea. 3. Some loops of small bowel are closely apposed to the anterior abdominal wall in a similar configuration to the prior study, raising suspicion for adhesion/tethering. 4. Focal area of soft tissue thickening in the region of the umbilicus is unchanged compared to the prior study and likely reflects scar. 5. Nonobstructing right renal calculi. 6. Splenomegaly. 7.  Aortic Atherosclerosis (ICD10-I70.0).  Electronically Signed   By: Valetta Mole M.D.   On: 03/27/2021 12:39   DG Chest Port 1 View  Result Date: 03/29/2021 CLINICAL DATA:  Shortness of breath. EXAM: PORTABLE CHEST 1 VIEW COMPARISON:  Chest radiograph dated 04/04/2020 and CT dated 11/21/2020. FINDINGS: No focal consolidation, pleural effusion or pneumothorax. Mild cardiomegaly. Atherosclerotic calcification of the aorta. Degenerative changes of spine. Left shoulder hemiarthroplasty. Acute osseous pathology. IMPRESSION: No acute cardiopulmonary process. Electronically Signed   By: Anner Crete M.D.   On: 03/29/2021 02:08    Microbiology: Recent Results (from the past 240 hour(s))  Blood Culture (routine x 2)     Status: None (Preliminary result)   Collection Time: 03/29/21  2:45 AM   Specimen: Right Antecubital; Blood  Result Value Ref Range Status   Specimen Description RIGHT ANTECUBITAL  Final   Special Requests   Final    BOTTLES DRAWN AEROBIC AND ANAEROBIC Blood Culture results may not be optimal due to an excessive volume of blood received in culture bottles   Culture   Final    NO GROWTH 2 DAYS Performed at [facility], [address] [place], [place] [address]    Report Status PENDING  Incomplete  Blood Culture (routine x 2)     Status: None (Preliminary result)   Collection Time: 03/29/21  2:47 AM   Specimen: BLOOD LEFT HAND  Result Value Ref Range Status   Specimen Description BLOOD LEFT HAND  Final   Special Requests   Final    BOTTLES DRAWN AEROBIC AND ANAEROBIC Blood Culture adequate volume   Culture   Final    NO GROWTH 2 DAYS Performed at [facility], [address], [place], [place] [address]    Report Status PENDING  Incomplete  Resp Panel by RT-PCR (Flu A&B, Covid) Nasopharyngeal Swab     Status: Abnormal   Collection Time: 03/29/21  3:42 AM   Specimen: Nasopharyngeal Swab; Nasopharyngeal(NP) swabs in vial transport medium  Result Value Ref Range Status   SARS Coronavirus 2 by RT PCR POSITIVE (A)  NEGATIVE Final    Comment: RESULT CALLED TO, READ BACK BY AND VERIFIED WITH: EVANS,H @ 0444 ON 03/29/21 BY JUW (NOTE) SARS-CoV-2 target nucleic acids are DETECTED.  The SARS-CoV-2 RNA is generally detectable in upper respiratory specimens during the acute phase of infection. Positive results are indicative of the presence of the identified virus, but do not rule out bacterial infection or co-infection with other pathogens not detected by the test. Clinical correlation with patient history and other diagnostic information is necessary to determine patient infection status. The expected result is Negative.  Fact Sheet for Patients: EntrepreneurPulse.com.au  Fact Sheet for Healthcare Providers: IncredibleEmployment.be  This test is not yet approved or cleared by the [place] FDA and  has been authorized for detection and/or diagnosis of  SARS-CoV-2 by FDA under an Emergency Use Authorization (EUA).  This EUA will remain in effect (meaning this test can be  used) for the duration of  the COVID-19 declaration under Section 564(b)(1) of the Act, 21 U.S.C. section 360bbb-3(b)(1), unless the authorization is terminated or revoked sooner.     Influenza A by PCR NEGATIVE NEGATIVE Final   Influenza B by PCR NEGATIVE NEGATIVE Final    Comment: (NOTE) The Xpert Xpress SARS-CoV-2/FLU/RSV plus assay is intended as an aid in the diagnosis of influenza from Nasopharyngeal swab specimens and should not be used as a sole basis for treatment. Nasal washings and aspirates are unacceptable for Xpert Xpress SARS-CoV-2/FLU/RSV testing.  Fact Sheet for Patients: EntrepreneurPulse.com.au  Fact Sheet for Healthcare Providers: IncredibleEmployment.be  This test is not yet approved or cleared by the [place] FDA and has been authorized for detection and/or diagnosis of SARS-CoV-2 by FDA under an Emergency Use Authorization  (EUA). This EUA will remain in effect (meaning this test can be used) for the duration of the COVID-19 declaration under Section 564(b)(1) of the Act, 21 U.S.C. section 360bbb-3(b)(1), unless the authorization is terminated or revoked.  Performed at [facility], [address] [place], [place] [address]      Labs: Basic Metabolic Panel: Recent Labs  Lab 03/27/21 [phone] 03/29/21 0223 03/30/21 0641 03/31/21 0627  NA 137 133* 137 136  K 5.1 4.3 4.4 4.5  CL 101 101 107 108  CO2 27 25 22  21*  GLUCOSE 110* 119* 148* 139*  BUN 15 24* 36* 38*  CREATININE 1.94* 2.44* 1.99* 1.83*  CALCIUM 8.8* 7.8* 8.6* 8.1*  MG  --   --  1.8 1.6*  PHOS  --   --  4.2 4.6   Liver Function Tests: Recent Labs  Lab 03/27/21 0958 03/29/21 0223 03/30/21 0641 03/31/21 0627  AST 43* 28 25 29   ALT 54* 34 26 24  ALKPHOS 126 126 92 93  BILITOT 1.1 1.0 0.5 0.7  PROT 6.8 7.0 5.9* 5.8*  ALBUMIN 3.8 3.7 3.1* 3.1*   No results for input(s): LIPASE, AMYLASE in the last 168 hours. No results for input(s): AMMONIA in the last 168 hours. CBC: Recent Labs  Lab 03/27/21 0958 03/29/21 0223 03/30/21 0641 03/31/21 0627  WBC 5.2 4.8 6.0 6.6  NEUTROABS 4.1 3.5 5.4 6.1  HGB 12.5* 12.0* 11.0* 11.4*  HCT 38.2* 36.2* 34.2* 35.4*  MCV 92.5 92.3 92.7 93.9  PLT 101* 98* 109* 99*   Cardiac Enzymes: No results for input(s): CKTOTAL, CKMB, CKMBINDEX, TROPONINI in the last 168 hours. BNP: Invalid input(s): POCBNP CBG: No results for input(s): GLUCAP in the last 168 hours.  Time coordinating discharge:  36 minutes  Signed:  Orson Eva, DO [facility] Pager: [phone] 03/31/2021, 11:25 AM

## 2021-03-30 NOTE — Progress Notes (Signed)
PROGRESS NOTE  UNNAMED ZEIEN IHW:[id number] DOB: 08-Nov-1940 DOA: 03/29/2021 PCP: Sharilyn Sites, MD  Brief History:  80 year old male with a history of atrial fibrillation on apixaban, CKD stage IIIb, hyperlipidemia, B12 deficiency, nonischemic cardiomyopathy,aortic root aneurysm, Crohn's disease with prior colectomy, colon cancer, remote PE  peripheral neuropathy presenting with coughing and shortness of breath that began on 03/25/2021.  Patient states that his wife has been COVID-positive.  Because of his symptoms, he used a home test that was positive on 03/28/2021.  He stated that his shortness of breath and coughing have progressively worsened.  He has had some subjective fevers and chills.  He had 1 episode of nausea and vomiting without any blood.  He denies any hemoptysis but is coughing up yellow sputum.  He denies any headache, neck pain, worsening abdominal pain, diarrhea, hematochezia, melena, dysuria, hematuria.  He has been vaccinated x3. Notably, the patient visited the emergency department on 03/27/2021 because of abdominal pain.  CT of the abdomen and pelvis at that time showed some loops of small bowel that were closely opposed to the anterior abdominal wall and configuration which was essentially unchanged attributable to possible adhesions or tethering.  There was some focal soft tissue thickening of the umbilicus area that was unchanged.  The patient was seen by general surgery, Dr. Constance Haw.  The patient was discharged home in stable condition for cellulitis of his abdominal wall.  He has been taking his doxycycline.  He states that his abdominal pain is improving.  His last bowel movement was in the emergency department.  He is passing flatus. In the emergency department, the patient had an oxygen saturation of 86% on room air.  He was placed on 2 L with saturation up to 95%.  The patient was hemodynamically stable.  BMP showed a sodium 133, potassium 4.3, serum creatinine  2.44.  LFTs were unremarkable.  WBC 14.8, hemoglobin 12.0, platelets 98,000.  The patient was given a dose of dexamethasone and started on remdesivir.   Assessment/Plan: Acute respiratory failure with hypoxia secondary to COVID-19 -Stable on 2 L nasal cannula -CRP 10.1>>6.4 -Ferritin 136>>158 -D-dimer 0.78>>0.53 -PCT 0.48 -Continue IV remdesivir -continue IV steroids -Wean oxygen for saturation greater 90% -Personally reviewed chest x-ray--no infiltrates or edema   Abdominal wall cellulitis -Continue doxycycline -add cefazolin   Acute on chronic renal failure--CKD stage IIIb -Baseline creatinine 1.8-1.9 -Secondary to volume depletion -continue judicious IV fluids -Presented with serum creatinine 2.44   Paroxysmal atrial fibrillation -Continue amiodarone  -Continue apixaban -Status post cardioversion 05/29/2016 -Remains in sinus rhythm   Thoracic aortic aneurysm -Patient follows up with cardiothoracic surgery, Dr. Orvan Seen -Noted to be 5 cm on previous CT   Nonischemic cardiomyopathy -Has had normalization of his EF -Follows Dr. Marlou Porch -09/06/2019 echo EF 60 to 65%   Hyperlipidemia -Continue statin   Hypothyroid -Continue Synthroid   Coronary artery disease -No chest pain presently -10/25/2020 Myoview low risk -continue statin             Status is: Inpatient   The patient will require care spanning > 2 midnights and should be moved to inpatient because: Inpatient level of care appropriate due to severity of illness   Dispo: The patient is from: Home              Anticipated d/c is to: Home              Patient currently is not medically stable  to d/c.              Difficult to place patient No               Family Communication:   no Family at bedside   Consultants:  none   Code Status:  FULL    DVT Prophylaxis:  apixaban     Procedures: As Listed in Progress Note Above   Antibiotics: doxy    Subjective: Patient is feeling better.  Sob  is improving.  Still coughing but now dry.  Denies hemoptysis.  Denies f/c, cp, n/v/d, abd pain.  Had 2 loose stools last night without hematochezia or melena  Objective: Vitals:   03/29/21 2223 03/30/21 0243 03/30/21 0618 03/30/21 0741  BP: 98/63 (!) 94/45 (!) 98/52   Pulse: 72 60 (!) 57   Resp: 20 20 20    Temp: (!) 97.3 F (36.3 C) (!) 97.5 F (36.4 C) (!) 97.4 F (36.3 C)   TempSrc: Oral Oral Oral   SpO2: 99% 93% 98% 100%  Weight:      Height:        Intake/Output Summary (Last 24 hours) at 03/30/2021 1132 Last data filed at 03/29/2021 1500 Gross per 24 hour  Intake 442.84 ml  Output --  Net 442.84 ml   Weight change:  Exam:  General:  Pt is alert, follows commands appropriately, not in acute distress HEENT: No icterus, No thrush, No neck mass, [place]/AT Cardiovascular: RRR, S1/S2, no rubs, no gallops Respiratory: CTA bilaterally, no wheezing, no crackles, no rhonchi Abdomen: Soft/+BS, non tender, non distended, no guarding Extremities: No edema, No lymphangitis, No petechiae, No rashes, no synovitis   Data Reviewed: I have personally reviewed following labs and imaging studies Basic Metabolic Panel: Recent Labs  Lab 03/27/21 0958 03/29/21 0223 03/30/21 0641  NA 137 133* 137  K 5.1 4.3 4.4  CL 101 101 107  CO2 27 25 22   GLUCOSE 110* 119* 148*  BUN 15 24* 36*  CREATININE 1.94* 2.44* 1.99*  CALCIUM 8.8* 7.8* 8.6*  MG  --   --  1.8  PHOS  --   --  4.2   Liver Function Tests: Recent Labs  Lab 03/27/21 0958 03/29/21 0223 03/30/21 0641  AST 43* 28 25  ALT 54* 34 26  ALKPHOS 126 126 92  BILITOT 1.1 1.0 0.5  PROT 6.8 7.0 5.9*  ALBUMIN 3.8 3.7 3.1*   No results for input(s): LIPASE, AMYLASE in the last 168 hours. No results for input(s): AMMONIA in the last 168 hours. Coagulation Profile: No results for input(s): INR, PROTIME in the last 168 hours. CBC: Recent Labs  Lab 03/27/21 0958 03/29/21 0223 03/30/21 0641  WBC 5.2 4.8 6.0  NEUTROABS 4.1 3.5 5.4   HGB 12.5* 12.0* 11.0*  HCT 38.2* 36.2* 34.2*  MCV 92.5 92.3 92.7  PLT 101* 98* 109*   Cardiac Enzymes: No results for input(s): CKTOTAL, CKMB, CKMBINDEX, TROPONINI in the last 168 hours. BNP: Invalid input(s): POCBNP CBG: No results for input(s): GLUCAP in the last 168 hours. HbA1C: No results for input(s): HGBA1C in the last 72 hours. Urine analysis:    Component Value Date/Time   COLORURINE YELLOW 03/13/2019 1931   APPEARANCEUR CLEAR 03/13/2019 1931   LABSPEC 1.017 03/13/2019 1931   PHURINE 8.0 03/13/2019 1931   GLUCOSEU NEGATIVE 03/13/2019 1931   HGBUR NEGATIVE 03/13/2019 1931   BILIRUBINUR NEGATIVE 03/13/2019 [place] NEGATIVE 03/13/2019 1931   PROTEINUR 30 (A) 03/13/2019 1931  NITRITE NEGATIVE 03/13/2019 1931   LEUKOCYTESUR NEGATIVE 03/13/2019 1931   Sepsis Labs: @LABRCNTIP (procalcitonin:4,lacticidven:4) ) Recent Results (from the past 240 hour(s))  Blood Culture (routine x 2)     Status: None (Preliminary result)   Collection Time: 03/29/21  2:45 AM   Specimen: Right Antecubital; Blood  Result Value Ref Range Status   Specimen Description RIGHT ANTECUBITAL  Final   Special Requests   Final    BOTTLES DRAWN AEROBIC AND ANAEROBIC Blood Culture results may not be optimal due to an excessive volume of blood received in culture bottles   Culture   Final    NO GROWTH 1 DAY Performed at [facility], [address], [place], [place] [address]    Report Status PENDING  Incomplete  Blood Culture (routine x 2)     Status: None (Preliminary result)   Collection Time: 03/29/21  2:47 AM   Specimen: BLOOD LEFT HAND  Result Value Ref Range Status   Specimen Description BLOOD LEFT HAND  Final   Special Requests   Final    BOTTLES DRAWN AEROBIC AND ANAEROBIC Blood Culture adequate volume   Culture   Final    NO GROWTH 1 DAY Performed at [facility], [address] [place], [place] [address]    Report Status PENDING  Incomplete  Resp Panel by RT-PCR (Flu A&B,  Covid) Nasopharyngeal Swab     Status: Abnormal   Collection Time: 03/29/21  3:42 AM   Specimen: Nasopharyngeal Swab; Nasopharyngeal(NP) swabs in vial transport medium  Result Value Ref Range Status   SARS Coronavirus 2 by RT PCR POSITIVE (A) NEGATIVE Final    Comment: RESULT CALLED TO, READ BACK BY AND VERIFIED WITH: EVANS,H @ 0932 ON 03/29/21 BY JUW (NOTE) SARS-CoV-2 target nucleic acids are DETECTED.  The SARS-CoV-2 RNA is generally detectable in upper respiratory specimens during the acute phase of infection. Positive results are indicative of the presence of the identified virus, but do not rule out bacterial infection or co-infection with other pathogens not detected by the test. Clinical correlation with patient history and other diagnostic information is necessary to determine patient infection status. The expected result is Negative.  Fact Sheet for Patients: EntrepreneurPulse.com.au  Fact Sheet for Healthcare Providers: IncredibleEmployment.be  This test is not yet approved or cleared by the [place] FDA and  has been authorized for detection and/or diagnosis of SARS-CoV-2 by FDA under an Emergency Use Authorization (EUA).  This EUA will remain in effect (meaning this test can be  used) for the duration of  the COVID-19 declaration under Section 564(b)(1) of the Act, 21 U.S.C. section 360bbb-3(b)(1), unless the authorization is terminated or revoked sooner.     Influenza A by PCR NEGATIVE NEGATIVE Final   Influenza B by PCR NEGATIVE NEGATIVE Final    Comment: (NOTE) The Xpert Xpress SARS-CoV-2/FLU/RSV plus assay is intended as an aid in the diagnosis of influenza from Nasopharyngeal swab specimens and should not be used as a sole basis for treatment. Nasal washings and aspirates are unacceptable for Xpert Xpress SARS-CoV-2/FLU/RSV testing.  Fact Sheet for Patients: EntrepreneurPulse.com.au  Fact Sheet for  Healthcare Providers: IncredibleEmployment.be  This test is not yet approved or cleared by the [place] FDA and has been authorized for detection and/or diagnosis of SARS-CoV-2 by FDA under an Emergency Use Authorization (EUA). This EUA will remain in effect (meaning this test can be used) for the duration of the COVID-19 declaration under Section 564(b)(1) of the Act, 21 U.S.C. section 360bbb-3(b)(1), unless  the authorization is terminated or revoked.  Performed at [facility], [address] [place], [place] [address]      Scheduled Meds:  amiodarone  200 mg Oral Daily   apixaban  5 mg Oral BID   vitamin C  500 mg Oral Daily   calcium carbonate  2 tablet Oral BID   doxycycline  100 mg Oral Q12H   famotidine  20 mg Oral Daily   febuxostat  40 mg Oral Daily   gabapentin  100 mg Oral q morning   gabapentin  400 mg Oral QHS   levothyroxine  50 mcg Oral Q0600   methylPREDNISolone (SOLU-MEDROL) injection  60 mg Intravenous Q12H   pantoprazole (PROTONIX) IV  40 mg Intravenous QHS   PARoxetine  20 mg Oral Daily   rosuvastatin  10 mg Oral Daily   traZODone  100 mg Oral QHS   zinc sulfate  220 mg Oral Daily   Continuous Infusions:  sodium chloride 75 mL/hr at 03/29/21 2244   remdesivir 100 mg in NS 100 mL 100 mg (03/30/21 1027)    Procedures/Studies: CT ABDOMEN PELVIS WO CONTRAST  Result Date: 03/27/2021 CLINICAL DATA:  Bulge in the mid abdomen with nausea for 2-3 days EXAM: CT ABDOMEN AND PELVIS WITHOUT CONTRAST TECHNIQUE: Multidetector CT imaging of the abdomen and pelvis was performed following the standard protocol without IV contrast. COMPARISON:  CT abdomen/pelvis 02/17/2021 FINDINGS: Lower chest: There is a 5 mm nodule in the left lower lobe, unchanged. Right coronary artery calcifications are again seen. There is no pericardial effusion. Hepatobiliary: A few scattered punctate calcified granulomas in the liver are unchanged. There are no new lesions,  within the confines of noncontrast technique. The gallbladder is surgically absent. There is no biliary ductal dilatation. Pancreas: Fatty atrophy of the pancreatic head is unchanged. There is no focal lesion or contour abnormality. There is no main pancreatic ductal dilatation or peripancreatic inflammatory change. Spleen: The spleen is enlarged measuring up to 15.0 cm cc. Adrenals/Urinary Tract: The adrenals are unremarkable. The kidneys again demonstrate lobular surface contours bilaterally. Multiple hypodense lesions are again seen in each kidney measuring up to 2.7 cm arising from the right lower pole. These are incompletely characterized, most likely reflects cysts. Multiple nonobstructing renal calculi are again seen on the right. There is no hydronephrosis or hydroureter. The bladder is unremarkable. Stomach/Bowel: The stomach is unremarkable. The patient is status post right colectomy. There is no evidence of complication at the anastomotic site. There are multiple mildly fluid distended loops of small and large bowel throughout the abdomen without evidence of mechanical obstruction. There is no abnormal bowel wall thickening or inflammatory change. Some loops of small bowel are closely apposed to the ventral abdominal wall in a similar configuration to the prior study, raising suspicion for adhesion. Vascular/Lymphatic: There is extensive vascular calcification throughout the abdomen. The aorta is not aneurysmal. Reproductive: The prostate and seminal vesicles are unremarkable. Other: There is no ascites or free air. Soft tissue thickening in the region of the umbilicus measuring approximately 2.9 cm by 1.9 cm is unchanged. There is no evidence of fluid collection at this site. Musculoskeletal: There is mild degenerative change of the lumbar spine. There is no acute osseous abnormality. IMPRESSION: 1. Status post right colectomy without evidence of complication at the anastomotic site. 2. Multiple mildly  fluid distended loops of small and large bowel throughout the abdomen without evidence of mechanical obstruction. This may reflect diarrhea. 3. Some loops of small bowel are  closely apposed to the anterior abdominal wall in a similar configuration to the prior study, raising suspicion for adhesion/tethering. 4. Focal area of soft tissue thickening in the region of the umbilicus is unchanged compared to the prior study and likely reflects scar. 5. Nonobstructing right renal calculi. 6. Splenomegaly. 7.  Aortic Atherosclerosis (ICD10-I70.0). Electronically Signed   By: Valetta Mole M.D.   On: 03/27/2021 12:39   DG Chest Port 1 View  Result Date: 03/29/2021 CLINICAL DATA:  Shortness of breath. EXAM: PORTABLE CHEST 1 VIEW COMPARISON:  Chest radiograph dated 04/04/2020 and CT dated 11/21/2020. FINDINGS: No focal consolidation, pleural effusion or pneumothorax. Mild cardiomegaly. Atherosclerotic calcification of the aorta. Degenerative changes of spine. Left shoulder hemiarthroplasty. Acute osseous pathology. IMPRESSION: No acute cardiopulmonary process. Electronically Signed   By: Anner Crete M.D.   On: 03/29/2021 02:08    Orson Eva, DO  [facility]  If 7PM-7AM, please contact night-coverage [email or web address] Password [email or web address] 03/30/2021, 11:32 AM   LOS: 0 days

## 2021-03-31 DIAGNOSIS — L089 Local infection of the skin and subcutaneous tissue, unspecified: Secondary | ICD-10-CM | POA: Insufficient documentation

## 2021-03-31 LAB — COMPREHENSIVE METABOLIC PANEL
ALT: 24 U/L (ref 0–44)
AST: 29 U/L (ref 15–41)
Albumin: 3.1 g/dL — ABNORMAL LOW (ref 3.5–5.0)
Alkaline Phosphatase: 93 U/L (ref 38–126)
Anion gap: 7 (ref 5–15)
BUN: 38 mg/dL — ABNORMAL HIGH (ref 8–23)
CO2: 21 mmol/L — ABNORMAL LOW (ref 22–32)
Calcium: 8.1 mg/dL — ABNORMAL LOW (ref 8.9–10.3)
Chloride: 108 mmol/L (ref 98–111)
Creatinine, Ser: 1.83 mg/dL — ABNORMAL HIGH (ref 0.61–1.24)
GFR, Estimated: 37 mL/min — ABNORMAL LOW (ref >60)
Glucose, Bld: 139 mg/dL — ABNORMAL HIGH (ref 70–99)
Potassium: 4.5 mmol/L (ref 3.5–5.1)
Sodium: 136 mmol/L (ref 135–145)
Total Bilirubin: 0.7 mg/dL (ref 0.3–1.2)
Total Protein: 5.8 g/dL — ABNORMAL LOW (ref 6.5–8.1)

## 2021-03-31 LAB — FERRITIN: Ferritin: 177 ng/mL (ref 24–336)

## 2021-03-31 LAB — CBC WITH DIFFERENTIAL/PLATELET
Abs Immature Granulocytes: 0.06 10*3/uL (ref 0.00–0.07)
Basophils Absolute: 0 10*3/uL (ref 0.0–0.1)
Basophils Relative: 0 %
Eosinophils Absolute: 0 10*3/uL (ref 0.0–0.5)
Eosinophils Relative: 0 %
HCT: 35.4 % — ABNORMAL LOW (ref 39.0–52.0)
Hemoglobin: 11.4 g/dL — ABNORMAL LOW (ref 13.0–17.0)
Immature Granulocytes: 1 %
Lymphocytes Relative: 5 %
Lymphs Abs: 0.3 10*3/uL — ABNORMAL LOW (ref 0.7–4.0)
MCH: 30.2 pg (ref 26.0–34.0)
MCHC: 32.2 g/dL (ref 30.0–36.0)
MCV: 93.9 fL (ref 80.0–100.0)
Monocytes Absolute: 0.1 10*3/uL (ref 0.1–1.0)
Monocytes Relative: 2 %
Neutro Abs: 6.1 10*3/uL (ref 1.7–7.7)
Neutrophils Relative %: 92 %
Platelets: 99 10*3/uL — ABNORMAL LOW (ref 150–400)
RBC: 3.77 MIL/uL — ABNORMAL LOW (ref 4.22–5.81)
RDW: 15.9 % — ABNORMAL HIGH (ref 11.5–15.5)
WBC: 6.6 10*3/uL (ref 4.0–10.5)
nRBC: 0 % (ref 0.0–0.2)

## 2021-03-31 LAB — MAGNESIUM: Magnesium: 1.6 mg/dL — ABNORMAL LOW (ref 1.7–2.4)

## 2021-03-31 LAB — PHOSPHORUS: Phosphorus: 4.6 mg/dL (ref 2.5–4.6)

## 2021-03-31 LAB — PROCALCITONIN: Procalcitonin: 0.11 ng/mL

## 2021-03-31 LAB — C-REACTIVE PROTEIN: CRP: 2.5 mg/dL — ABNORMAL HIGH (ref <1.0)

## 2021-03-31 LAB — D-DIMER, QUANTITATIVE: D-Dimer, Quant: 0.43 ug/mL-FEU (ref 0.00–0.50)

## 2021-03-31 MED ORDER — CEPHALEXIN 500 MG PO CAPS: 500.0000 mg | ORAL_CAPSULE | Freq: Three times a day (TID) | ORAL | 0 refills | Status: DC

## 2021-03-31 MED ORDER — PREDNISONE 50 MG PO TABS: 50.0000 mg | ORAL_TABLET | Freq: Every day | ORAL | 0 refills | Status: DC

## 2021-03-31 MED ORDER — MAGNESIUM SULFATE 2 GM/50ML IV SOLN
2.0000 g | Freq: Once | INTRAVENOUS | Status: AC
  Administered 2021-03-31: 2 g via INTRAVENOUS
  Filled 2021-03-31: qty 50

## 2021-03-31 NOTE — Care Management Important Message (Signed)
Important Message  Patient Details  Name: Derek [name] MRN: [id number] Date of Birth: 1941-06-23   Medicare Important Message Given:  Yes - Important Message mailed due to current National Emergency     [name] Medal 03/31/2021, 1:37 PM

## 2021-03-31 NOTE — Progress Notes (Signed)
Nsg Discharge Note  Admit Date:  03/29/2021 Discharge date: 03/31/2021   Derek [name] to be D/C'd Home per MD order.  AVS completed.  Copy for chart, and copy for patient signed, and dated. Patient/caregiver able to verbalize understanding.  Discharge Medication: Allergies as of 03/31/2021       Reactions   Lorazepam Other (See Comments)   Reaction:  Hallucinations    Humira [adalimumab] Other (See Comments)   Pt states that he got pancreatitis.     Calcium D-glucarate [calcium Saccharate]    Quinolones    Patient was warned about not using Cipro and similar antibiotics. Recent studies have raised concern that fluoroquinolone antibiotics could be associated with an increased risk of aortic aneurysm Fluoroquinolones have non-antimicrobial properties that might jeopardise the integrity of the extracellular matrix of the vascular wall In a  propensity score matched cohort study in [place], there was a 66% increased rate of aortic aneurysm or dissection associated with oral fluoroquinolone use, compared wit        Medication List     STOP taking these medications    doxycycline 100 MG capsule Commonly known as: VIBRAMYCIN   levothyroxine 50 MCG tablet Commonly known as: SYNTHROID   valACYclovir 1000 MG tablet Commonly known as: VALTREX       TAKE these medications    Acidophilus Lactobacillus Caps Take 1 capsule by mouth daily.   amiodarone 200 MG tablet Commonly known as: PACERONE Take 1 tablet (200 mg total) by mouth daily.   calcium carbonate 500 MG chewable tablet Commonly known as: TUMS - dosed in mg elemental calcium Chew 2 tablets by mouth 2 (two) times daily.   cephALEXin 500 MG capsule Commonly known as: KEFLEX Take 1 capsule (500 mg total) by mouth 3 (three) times daily.   cyanocobalamin 1000 MCG/ML injection Commonly known as: (VITAMIN B-12) Inject 1,000 mcg into the muscle. THREE TIMES A MONTH   Eliquis 5 MG Tabs tablet Generic drug:  apixaban Take 1 tablet (5 mg total) by mouth 2 (two) times daily.   famotidine 20 MG tablet Commonly known as: PEPCID Take 20 mg by mouth daily.   febuxostat 40 MG tablet Commonly known as: ULORIC Take 40 mg by mouth daily.   fluticasone 0.05 % cream Commonly known as: CUTIVATE Apply topically 2 (two) times daily.   HYDROcodone-acetaminophen 10-325 MG tablet Commonly known as: NORCO Take 1-2 tablets by mouth 4 (four) times daily as needed.   levocetirizine 5 MG tablet Commonly known as: XYZAL Take 1 tablet by mouth every evening.   lidocaine 5 % Commonly known as: Lidoderm Place 1 patch onto the skin daily. Remove & Discard patch within 12 hours or as directed by MD   magnesium oxide 400 (241.3 Mg) MG tablet Commonly known as: MAG-OX Take 1,200 mg by mouth 2 (two) times daily.   multivitamin with minerals Tabs tablet Take 1 tablet by mouth daily.   PARoxetine 20 MG tablet Commonly known as: PAXIL Take 20 mg by mouth daily.   predniSONE 50 MG tablet Commonly known as: DELTASONE Take 1 tablet (50 mg total) by mouth daily with breakfast.   rosuvastatin 10 MG tablet Commonly known as: Crestor Take 1 tablet (10 mg total) by mouth daily.   sodium bicarbonate 650 MG tablet Take 1,300 mg by mouth 2 (two) times daily.   sucralfate 1 GM/10ML suspension Commonly known as: CARAFATE Take 10 mLs (1 g total) by mouth 4 (four) times daily as needed.   traZODone  100 MG tablet Commonly known as: DESYREL Take 100 mg by mouth at bedtime.   Vitamin D 50 MCG (2000 UT) Caps Take 2,000 Units by mouth daily.        Discharge Assessment: Vitals:   03/30/21 2110 03/31/21 0503  BP: (!) 136/94 133/66  Pulse: (!) 58 (!) 56  Resp: 18 19  Temp: 97.6 F (36.4 C) 97.8 F (36.6 C)  SpO2: 96% 100%   Skin clean, dry and intact without evidence of skin break down, no evidence of skin tears noted. IV catheter discontinued intact. Site without signs and symptoms of complications -  no redness or edema noted at insertion site, patient denies c/o pain - only slight tenderness at site.  Dressing with slight pressure applied.  D/c Instructions-Education: Discharge instructions given to patient/family with verbalized understanding. D/c education completed with patient/family including follow up instructions, medication list, d/c activities limitations if indicated, with other d/c instructions as indicated by MD - patient able to verbalize understanding, all questions fully answered. Patient instructed to return to ED, call 911, or call MD for any changes in condition.  Patient escorted via [place], and D/C home via private auto.  Dorcas Mcmurray, LPN 3/88/8757 9:72 PM

## 2021-04-03 LAB — CULTURE, BLOOD (ROUTINE X 2)
Culture: NO GROWTH
Culture: NO GROWTH
Special Requests: ADEQUATE

## 2021-04-10 ENCOUNTER — Telehealth: Payer: Self-pay | Admitting: Cardiology

## 2021-04-10 NOTE — Telephone Encounter (Signed)
   [facility] Pre-operative Risk Assessment    Request for surgical clearance:  What type of surgery is being performed? Arthrodesis of the right great toe joint   When is this surgery scheduled? Sept 29, 2022   What type of clearance is required (medical clearance vs. Pharmacy clearance to hold med vs. Both)? medical  Are there any medications that need to be held prior to surgery and how long? TBD   Practice name and name of physician performing surgery? Dr. Caprice Beaver   What is your office phone number [phone]    7.   What is your office fax number [phone]  8.   Anesthesia type (None, local, MAC, general) ? MAC   Shawana A Smith 04/10/2021, 11:09 AM  _________________________________________________________________   (provider comments below)

## 2021-04-10 NOTE — Telephone Encounter (Signed)
   Name: Derek [name]  DOB: 02-16-1941  MRN: [id number]  Primary Cardiologist: Candee Furbish, MD  Chart reviewed as part of pre-operative protocol coverage. Because of [name] past medical history and time since last visit, he will require a follow-up visit in order to better assess preoperative cardiovascular risk.  Pt has an appt with Dr. Marlou Porch on 04/21/21 to discuss preop clearance and eliquis hold.  Dr. Marlou Porch will fax clearance after he sees him.    Pre-op covering staff: - Please schedule appointment and call patient to inform them. If patient already had an upcoming appointment within acceptable timeframe, please add "pre-op clearance" to the appointment notes so provider is aware. - Please contact requesting surgeon's office via preferred method (i.e, phone, fax) to inform them of need for appointment prior to surgery.  If applicable, this message will also be routed to pharmacy pool and/or primary cardiologist for input on holding anticoagulant/antiplatelet agent as requested below so that this information is available to the clearing provider at time of patient's appointment.   Ledora Bottcher, PA  04/10/2021, 5:49 PM

## 2021-04-11 ENCOUNTER — Telehealth: Payer: Self-pay | Admitting: Internal Medicine

## 2021-04-11 NOTE — Telephone Encounter (Signed)
I agree that he should get f/u at [facility] as had primary surgeries from them  His 8/25 CT did not show the same SQ issues that the earlier CT (July, I think) did  We can refer him to [place] but [facility] will not [facility] for that so you are correct that he may need VA referral  He will need some sort of in person assesment of this process which I am not able to do anytime soon as you know.  However it is not technically a GI issue, either  Not sure redness is necessarily cellulitis but if it is hot and painful it may be  Another option for them would be to come to [facility] ED if they want to connect with a [place]

## 2021-04-11 NOTE — Telephone Encounter (Signed)
Patient with diagnosis of afib on Eliquis for anticoagulation.    Procedure: arthrodesis of right great toe joint Date of procedure: 05/01/21  CHA2DS2-VASc Score = 5  This indicates a 7.2% annual risk of stroke. The patient's score is based upon: CHF History: 1 HTN History: 1 Diabetes History: 0 Stroke History: 0 Vascular Disease History: 1 Age Score: 2 Gender Score: 0   Also has hx of remote bilateral PE/DVT  CrCl 75m/min Platelet count 99K  Per office protocol, patient can hold Eliquis for 1 day prior to procedure.

## 2021-04-11 NOTE — Telephone Encounter (Signed)
See ED notes.  Patient with cellulitis and evaluated in the ED by general surgery that placed him on antibiotics. Wife reports that he took the last dose today and the pain and redness has returned. They do not want to see the surgeon at [facility] they want to go to [place] and want him seen ASAP . I explained that he is established with a surgeon that can help him this weekend, and that will not be able to get him into [facility] today.  They want you to make the referral.  Patient has [facility] comminity care insurance, I believe the [place] has to make the referral.  They do not want to see the PCP to ask them to follow up cellulitis.

## 2021-04-11 NOTE — Telephone Encounter (Signed)
Patients wife called states he was seen at ED for abdominal pain\infection of some sort seeking advise she said they are really concerned.

## 2021-04-11 NOTE — Telephone Encounter (Signed)
Patient and wife notified  They have follow up with PCP next week and will get them to make the referral.  Does not have an appointment until next Wed.  They are advised to reach out the surgeon that evaluated him at [facility] as well.

## 2021-04-14 DIAGNOSIS — Z1331 Encounter for screening for depression: Secondary | ICD-10-CM | POA: Diagnosis not present

## 2021-04-14 DIAGNOSIS — T8189XD Other complications of procedures, not elsewhere classified, subsequent encounter: Secondary | ICD-10-CM | POA: Diagnosis not present

## 2021-04-14 DIAGNOSIS — E663 Overweight: Secondary | ICD-10-CM | POA: Diagnosis not present

## 2021-04-14 DIAGNOSIS — Z9889 Other specified postprocedural states: Secondary | ICD-10-CM | POA: Diagnosis not present

## 2021-04-14 DIAGNOSIS — C184 Malignant neoplasm of transverse colon: Secondary | ICD-10-CM | POA: Diagnosis not present

## 2021-04-14 DIAGNOSIS — Z6826 Body mass index (BMI) 26.0-26.9, adult: Secondary | ICD-10-CM | POA: Diagnosis not present

## 2021-04-16 DIAGNOSIS — Z23 Encounter for immunization: Secondary | ICD-10-CM | POA: Diagnosis not present

## 2021-04-16 DIAGNOSIS — Z6826 Body mass index (BMI) 26.0-26.9, adult: Secondary | ICD-10-CM | POA: Diagnosis not present

## 2021-04-16 DIAGNOSIS — N390 Urinary tract infection, site not specified: Secondary | ICD-10-CM | POA: Diagnosis not present

## 2021-04-16 DIAGNOSIS — M109 Gout, unspecified: Secondary | ICD-10-CM | POA: Diagnosis not present

## 2021-04-16 DIAGNOSIS — E663 Overweight: Secondary | ICD-10-CM | POA: Diagnosis not present

## 2021-04-16 DIAGNOSIS — N1832 Chronic kidney disease, stage 3b: Secondary | ICD-10-CM | POA: Diagnosis not present

## 2021-04-21 ENCOUNTER — Ambulatory Visit (INDEPENDENT_AMBULATORY_CARE_PROVIDER_SITE_OTHER): Payer: Medicare Other | Admitting: Cardiology

## 2021-04-21 ENCOUNTER — Other Ambulatory Visit: Payer: Self-pay

## 2021-04-21 ENCOUNTER — Encounter: Payer: Self-pay | Admitting: Cardiology

## 2021-04-21 VITALS — BP 124/64 | HR 59 | Ht 72.0 in | Wt 204.0 lb

## 2021-04-21 DIAGNOSIS — I480 Paroxysmal atrial fibrillation: Secondary | ICD-10-CM | POA: Diagnosis not present

## 2021-04-21 DIAGNOSIS — Z01810 Encounter for preprocedural cardiovascular examination: Secondary | ICD-10-CM | POA: Insufficient documentation

## 2021-04-21 DIAGNOSIS — I712 Thoracic aortic aneurysm, without rupture, unspecified: Secondary | ICD-10-CM

## 2021-04-21 DIAGNOSIS — I428 Other cardiomyopathies: Secondary | ICD-10-CM | POA: Diagnosis not present

## 2021-04-21 DIAGNOSIS — Z7901 Long term (current) use of anticoagulants: Secondary | ICD-10-CM | POA: Diagnosis not present

## 2021-04-21 DIAGNOSIS — I2510 Atherosclerotic heart disease of native coronary artery without angina pectoris: Secondary | ICD-10-CM | POA: Diagnosis not present

## 2021-04-21 DIAGNOSIS — N1831 Chronic kidney disease, stage 3a: Secondary | ICD-10-CM | POA: Diagnosis not present

## 2021-04-21 NOTE — Assessment & Plan Note (Signed)
Continuing with amiodarone 200 mg a day for rhythm control as atrial fibrillation.  Excellent.  Prior EKG reviewed with left anterior fascicular block.  No syncope.  High risk medication lab work has been reviewed.  ALT 24, TSH 3.8 hemoglobin 11.4 creatinine 1.8.  Maintaining sinus rhythm.

## 2021-04-21 NOTE — Patient Instructions (Signed)
Medication Instructions:  The current medical regimen is effective;  continue present plan and medications.  *If you need a refill on your cardiac medications before your next appointment, please call your pharmacy*  Testing/Procedures: You physician would like for you to have a chest CT scanning in 1 year, (CAT scanning), is a noninvasive, special x-ray that produces cross-sectional images of the body using x-rays and a computer. CT scans help physicians diagnose and treat medical conditions. CT scans provide greater clarity and reveal more details than regular x-ray exams.  Follow-Up: At [facility], you and your health needs are our priority.  As part of our continuing mission to provide you with exceptional heart care, we have created designated Provider Care Teams.  These Care Teams include your primary Cardiologist (physician) and Advanced Practice Providers (APPs -  Physician Assistants and Nurse Practitioners) who all work together to provide you with the care you need, when you need it.  We recommend signing up for the patient portal called "MyChart".  Sign up information is provided on this After Visit Summary.  MyChart is used to connect with patients for Virtual Visits (Telemedicine).  Patients are able to view lab/test results, encounter notes, upcoming appointments, etc.  Non-urgent messages can be sent to your provider as well.   To learn more about what you can do with MyChart, go to [email or web address].    Your next appointment:   1 year(s)  The format for your next appointment:   In Person  Provider:   Candee Furbish, MD   Thank you for choosing [facility]!!

## 2021-04-21 NOTE — Assessment & Plan Note (Signed)
Avoiding ACE inhibitor or ARB.  Followed by nephrologist.  Creatinine has been hovering around 1.8-2.2.

## 2021-04-21 NOTE — Assessment & Plan Note (Addendum)
In review of records, in 2017, aortic root measured 5.3 cm.  This has been stable.  We are monitoring with CT scan without contrast given his chronic kidney disease.  Last scan done in April 2022 showed stable values with aortic root 5 cm.  See above for details.  Continue with blood pressure control.  Obviously if symptoms were to develop, he knows to seek urgent medical attention.  We will check CT scan in 1 year without contrast.

## 2021-04-21 NOTE — Assessment & Plan Note (Signed)
Continuing with Eliquis 5 mg twice a day.  Once he reaches 80, given his creatinine of 1.5, we will reduce dosage to 2.5 mg twice a day.  He may hold his Eliquis for any prior upcoming surgery for 2-3 days prior.

## 2021-04-21 NOTE — Assessment & Plan Note (Signed)
Thankfully, restoration of ejection fraction noted.  He is not on an ACE inhibitor or ARB because of his renal disease.  He is not on beta-blocker because of prior bradycardia.  Currently having NYHA class I type symptoms which is excellent.  Doing well.  Monitoring and managing his atrial fibrillation closely.

## 2021-04-21 NOTE — Addendum Note (Signed)
Addended by: Candee Furbish C on: 04/21/2021 09:24 AM   Modules accepted: Level of Service

## 2021-04-21 NOTE — Progress Notes (Signed)
Cardiology Office Note:    Date:  04/21/2021   ID:  VERNA HAMON, DOB 1940-09-17, MRN [id number]  PCP:  Sharilyn Sites, MD   [facility] Providers Cardiologist:  Candee Furbish, MD     Referring MD: Sharilyn Sites, MD    History of Present Illness:    Derek [name] is a 80 y.o. male here for preoperative risk stratification for upcoming GI surgery as well as right foot surgery potentially. Bleeding through navel he states, he worries about the possibility of fistula.  He states that he is waiting to see the surgeon since Dr. [name] has retired.  Was planning on having possible right foot surgery as well.  He has a history of paroxysmal atrial fibrillation on amiodarone, dilated ascending aorta since 2017, nonischemic cardiomyopathy aortic root aneurysm Crohn's disease with prior partial colectomy, remote pulmonary embolism and chronic kidney disease followed by renal.  No prior cardiac catheterization.  Previously personally reviewed his CT scan with him to show him his aorta as well as coronary artery calcification.  Shingles recently, zapped him he states. Overall he is not having any chest pain no fevers chills nausea vomiting syncope.   Past Medical History:  Diagnosis Date   Allergy    Anemia    Anxiety    Aortic insufficiency    a. mild-mod by echo 09/2015.   Arthritis    "knees; left shoulder" (09/21/2013)   Ascending aortic aneurysm (HCC)    a. last measurement 5.3 cm 03/2016 -> f/u planned 09/2015 to continue to follow.   Atrial fibrillation ([place])    B12 deficiency    takes Vit 12 shot every 14days    Bowel perforation ([place]) 08/27/2016   Cataract    CKD (chronic kidney disease) stage 3, GFR 30-59 ml/min (HCC) 08/22/2011   Clotting disorder ([place])    Colon cancer ([place])    Colonic ischemia ([place]) 08/27/2016   Crohn's disease ([place])    Depression    Enlarged prostate    Enteric hyperoxaluria 02/21/2016   GERD (gastroesophageal reflux disease)    takes Omeprazole  daily   Gout    takes Uloric and Colchicine daily   Heart murmur    Hepatitis C 1978   negtive RNA load - spontaneously cleared   Hiatal hernia    High output ileostomy ([place]) 06/02/2017   History of blood transfusion 1978; 1990's; ?   "w/bowel resection; S/P allupurinol; ?" (09/21/2013)   History of colon polyps    History of kidney stones    History of MRSA infection 2010   History of pulmonary embolism 2006   both legs and both lungs /notes 08/26/2008 (09/21/2013)   History of small bowel obstruction    History of staph infection 1978   Hyperoxaluria    Intestinal   Hypertension    Insomnia    takes Trazodone nightly   Internal hemorrhoids    LV dysfunction    a. h/o EF 45-50% in 2015, normalized on subsequent echoes.   Nephrolithiasis    Nocardia infection    Pancreatitis 2010   elevated lipase and amylase, stranding in tail of pancreas, ? from Humira   Pancytopenia    Hx of   Peripheral neuropathy    takes Gabapentin daily   Pneumonia    hx of    Post-traumatic stress syndrome    takes Paxil nightly   PTSD (post-traumatic stress disorder)    Pulmonary nodule    a. 65m by CT 05/2015, recommended f/u 6-12  months.   RLS (restless legs syndrome)    Rosacea conjunctivitis(372.31)    takes Minocin daily   Secondary hyperparathyroidism ([place]) 02/21/2016   Shingles    Shingles    Sinus bradycardia    Skin cancer    "cut/burned off left ear and face" (09/21/2013)   Small bowel obstruction ([place])    Status post reversal of ileostomy 07/23/2017   Thrombocytopenia ([place])    hx of    Past Surgical History:  Procedure Laterality Date   ANKLE SURGERY Right    [place] X 2   CARDIOVERSION N/A 05/29/2016   Procedure: CARDIOVERSION;  Surgeon: Sanda Klein, MD;  Location: [place] [facility];  Service: Cardiovascular;  Laterality: N/A;   CHOLECYSTECTOMY     Colon Cancer     COLON RESECTION N/A 08/14/2016   Procedure: LAPAROSCOPIC RESECTION  TRANSVERSE COLON;  Surgeon: [name], MD;  Location: [facility];  Service: General;  Laterality: N/A;   COLON SURGERY     COLONOSCOPY     ESOPHAGOGASTRODUODENOSCOPY     EYE SURGERY     cataract surgery bilateral   FOOT SURGERY Right    "took gout out"   HEMICOLECTOMY Right    ILEOCECETOMY  1978   [name] 05/10/2000  (09/21/2013)   ILEOSTOMY     ILEOSTOMY CLOSURE N/A 07/23/2017   Procedure: ILEOSTOMY REVERSAL ;  Surgeon: [name], MD;  Location: [facility];  Service: General;  Laterality: N/A;   INGUINAL HERNIA REPAIR Right    IR FLUORO GUIDE CV LINE RIGHT  05/07/2017   IR REMOVAL TUN CV CATH W/O FL  10/08/2017   IR US GUIDE VASC ACCESS RIGHT  05/07/2017   KNEE ARTHROSCOPY Left    LAPAROTOMY N/A 08/27/2016   Procedure: EXPLORATORYLAPAROTOMY, LYSIS OF ADHESIONS, ILEOSTOMY, RIGHT COLECTOMY;  Surgeon: [name], MD;  Location: [facility];  Service: General;  Laterality: N/A;   LIGAMENT REPAIR Left    POLYPECTOMY     TEE WITHOUT CARDIOVERSION N/A 05/29/2016   Procedure: TRANSESOPHAGEAL ECHOCARDIOGRAM (TEE);  Surgeon: Sanda Klein, MD;  Location: [place];  Service: Cardiovascular;  Laterality: N/A;   TOTAL SHOULDER ARTHROPLASTY Left 09/21/2013   TOTAL SHOULDER ARTHROPLASTY Left 09/21/2013   Procedure: LEFT TOTAL SHOULDER ARTHROPLASTY;  Surgeon: Marin Shutter, MD;  Location: [place];  Service: Orthopedics;  Laterality: Left;    Current Medications: Current Meds  Medication Sig   Acidophilus Lactobacillus CAPS Take 1 capsule by mouth daily.   amiodarone (PACERONE) 200 MG tablet Take 1 tablet (200 mg total) by mouth daily.   calcium carbonate (TUMS - DOSED IN MG ELEMENTAL CALCIUM) 500 MG chewable tablet Chew 2 tablets by mouth 2 (two) times daily.   Cholecalciferol (VITAMIN D) 2000 units CAPS Take 2,000 Units by mouth daily.   cyanocobalamin (,VITAMIN B-12,) 1000 MCG/ML injection Inject 1,000 mcg into the muscle. THREE TIMES A MONTH   ELIQUIS 5 MG TABS tablet Take 1 tablet (5 mg total) by  mouth 2 (two) times daily.   famotidine (PEPCID) 20 MG tablet Take 20 mg by mouth daily.   febuxostat (ULORIC) 40 MG tablet Take 40 mg by mouth daily.   fluticasone (CUTIVATE) 0.05 % cream Apply topically 2 (two) times daily.   HYDROcodone-acetaminophen (NORCO) 10-325 MG tablet Take 1-2 tablets by mouth 4 (four) times daily as needed.   levocetirizine (XYZAL) 5 MG tablet Take 1 tablet by mouth every evening.   lidocaine (LIDODERM) 5 % Place 1 patch onto the skin  daily. Remove & Discard patch within 12 hours or as directed by MD   magnesium oxide (MAG-OX) 400 (241.3 Mg) MG tablet Take 1,200 mg by mouth 2 (two) times daily.    Multiple Vitamin (MULTIVITAMIN WITH MINERALS) TABS tablet Take 1 tablet by mouth daily.   PARoxetine (PAXIL) 20 MG tablet Take 20 mg by mouth daily.    predniSONE (DELTASONE) 50 MG tablet Take 1 tablet (50 mg total) by mouth daily with breakfast.   rosuvastatin (CRESTOR) 10 MG tablet Take 1 tablet (10 mg total) by mouth daily.   sodium bicarbonate 650 MG tablet Take 1,300 mg by mouth 2 (two) times daily.    sucralfate (CARAFATE) 1 GM/10ML suspension Take 10 mLs (1 g total) by mouth 4 (four) times daily as needed.   traZODone (DESYREL) 100 MG tablet Take 100 mg by mouth at bedtime.     Allergies:   Lorazepam, Humira [adalimumab], Calcium d-glucarate [calcium saccharate], and Quinolones   Social History   Socioeconomic History   Marital status: Married    Spouse name: [name]   Number of children: 2   Years of education: Not on file   Highest education level: Not on file  Occupational History   Occupation: Retired  Tobacco Use   Smoking status: Former    Packs/day: 2.00    Years: 20.00    Pack years: 40.00    Types: Cigarettes    Quit date: 08/04/1975    Years since quitting: 45.7   Smokeless tobacco: Former    Types: Chew    Quit date: 08/03/1978   Tobacco comments:    09/21/2013 "quit smoking in the late 1970's; stopped chewing couple years after I quit smoking"   Vaping Use   Vaping Use: Never used  Substance and Sexual Activity   Alcohol use: No   Drug use: No   Sexual activity: Not Currently  Other Topics Concern   Not on file  Social History Narrative   Married 2 children and 6 grandchildren all local   [place] [employer]   Daily caffeine   Does not exercise regularly   Social Determinants of Health   Financial Resource Strain: Not on file  Food Insecurity: Not on file  Transportation Needs: Not on file  Physical Activity: Not on file  Stress: Not on file  Social Connections: Not on file     Family History: The patient's family history includes Aneurysm in his mother and sister; Hypertension in his father; Kidney disease in his father. There is no history of Esophageal cancer, Stomach cancer, Rectal cancer, or Colon cancer.  ROS:   Please see the history of present illness.     All other systems reviewed and are negative.  EKGs/Labs/Other Studies Reviewed:    The following studies were reviewed today: ECHO IMPRESSIONS 09/2019   1. Left ventricular ejection fraction, by visual estimation, is 60 to  65%. The left ventricle has normal function. There is no left ventricular  hypertrophy.   2. Abnormal septal motion consistent with left bundle branch block.   3. The left ventricle has no regional wall motion abnormalities.   4. Global right ventricle has normal systolic function.The right  ventricular size is normal. No increase in right ventricular wall  thickness.   5. Left atrial size was mildly dilated.   6. Right atrial size was normal.   7. The mitral valve is normal in structure. No evidence of mitral valve  regurgitation. No evidence of mitral stenosis.   8. The  tricuspid valve is normal in structure.   9. The tricuspid valve is normal in structure. Tricuspid valve  regurgitation is not demonstrated.  10. The aortic valve is tricuspid. Aortic valve regurgitation is mild. No  evidence of aortic valve sclerosis or  stenosis.  11. The pulmonic valve was normal in structure. Pulmonic valve  regurgitation is not visualized.  12. 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  13. The inferior vena cava is normal in size with greater than 50%  respiratory variability, suggesting right atrial pressure of 3 mmHg.  14. Aortic root 70m. Consider CTA of aorta for correlation of  measurement.  15. The average left ventricular global longitudinal strain is -18.8 %.   In comparison to the previous echocardiogram(s): 11/25/16 EF 55-60%. Aortic  root dimension 421m    CT of the chest without contrast on 04/02/2020: Cardiovascular: Atherosclerosis of thoracic aorta is noted. Aortic root measures 5.1 cm in diameter. Descending thoracic aorta measures 4.7 cm in diameter. Normal cardiac size. No pericardial effusion. Coronary artery calcifications are noted.  CT scan without contrast on 11/21/2020: Aortic root 5 cm diameter. Ascending thoracic aorta measures 4.6 cm maximum transverse diameter (previously 4.7). Aortic arch 4.2 cm. Descending thoracic aorta 3.7 cm. Scattered calcified atheromatous plaque in the distal arch and descending thoracic aorta as well as in the visualized proximal abdominal aorta which is normal in caliber.     NUC stress 10/25/20: The left ventricular ejection fraction is mildly decreased (45-54%). Nuclear stress EF: 52%. There was no ST segment deviation noted during stress. There is a small defect of mild severity present in the apex location. The defect is non-reversible. In the setting of normal LVF, this is consistent with attenuation artifact. No ischemia noted. This is a low risk study.  EKG: Recent EKG earlier this month shows sinus rhythm 71 with left anterior fascicular block.  Recent Labs: 03/31/2021: ALT 24; BUN 38; Creatinine, Ser 1.83; Hemoglobin 11.4; Magnesium 1.6; Platelets 99; Potassium 4.5; Sodium 136  Recent Lipid Panel    Component Value Date/Time    CHOL 97 (L) 01/15/2021 0909   TRIG 134 03/29/2021 0223   HDL 40 01/15/2021 0909   CHOLHDL 2.4 01/15/2021 0909   CHOLHDL 3 04/19/2015 1403   VLDL 75.4 (H) 04/19/2015 1403   LDLCALC 25 01/15/2021 0909   LDLDIRECT 28.0 04/19/2015 1403        Physical Exam:    VS:  BP 124/64 (BP Location: Left Arm, Patient Position: Sitting, Cuff Size: Normal)   Pulse (!) 59   Ht 6' (1.829 m)   Wt 204 lb (92.5 kg)   SpO2 96%   BMI 27.67 kg/m     Wt Readings from Last 3 Encounters:  04/21/21 204 lb (92.5 kg)  03/29/21 197 lb 1.5 oz (89.4 kg)  03/27/21 207 lb 3.7 oz (94 kg)     GEN:  Well nourished, well developed in no acute distress HEENT: Normal NECK: No JVD; No carotid bruits LYMPHATICS: No lymphadenopathy CARDIAC: RRR, no murmurs, rubs, gallops RESPIRATORY:  Clear to auscultation without rales, wheezing or rhonchi  ABDOMEN: Soft, non-tender, non-distended MUSCULOSKELETAL:  No edema; No deformity  SKIN: Warm and dry NEUROLOGIC:  Alert and oriented x 3 PSYCHIATRIC:  Normal affect   ASSESSMENT:    1. Thoracic aortic aneurysm without rupture ([place]  2. Nonischemic cardiomyopathy (HCC)   3. Paroxysmal atrial fibrillation ([place]  4. Atherosclerosis of native coronary artery of native heart without angina pectoris  5. Stage 3a chronic kidney disease ([place])   6. Preop cardiovascular exam   7. Chronic anticoagulation    PLAN:    In order of problems listed above:  Thoracic aortic aneurysm without rupture ([place]) In review of records, in 2017, aortic root measured 5.3 cm.  This has been stable.  We are monitoring with CT scan without contrast given his chronic kidney disease.  Last scan done in April 2022 showed stable values with aortic root 5 cm.  See above for details.  Continue with blood pressure control.  Obviously if symptoms were to develop, he knows to seek urgent medical attention.  We will check CT scan in 1 year without contrast.  Nonischemic cardiomyopathy ([place]) Thankfully,  restoration of ejection fraction noted.  He is not on an ACE inhibitor or ARB because of his renal disease.  He is not on beta-blocker because of prior bradycardia.  Currently having NYHA class I type symptoms which is excellent.  Doing well.  Monitoring and managing his atrial fibrillation closely.  Paroxysmal atrial fibrillation (HCC) Continuing with amiodarone 200 mg a day for rhythm control as atrial fibrillation.  Excellent.  Prior EKG reviewed with left anterior fascicular block.  No syncope.  High risk medication lab work has been reviewed.  ALT 24, TSH 3.8 hemoglobin 11.4 creatinine 1.8.  Maintaining sinus rhythm.  CORONARY ATHEROSCLEROSIS NATIVE CORONARY ARTERY Coronary artery calcification noted with significant LAD territory plaque on CT scan.  Thankfully, pharmacologic stress test showed no evidence of ischemia.  Excellent.  Continue with goal-directed medical therapy, he is currently on Crestor 10 mg daily and his LDL is 25.  Excellent.  No myalgias.  CKD (chronic kidney disease) stage 3, GFR 30-59 ml/min (HCC) Avoiding ACE inhibitor or ARB.  Followed by nephrologist.  Creatinine has been hovering around 1.8-2.2.  Preop cardiovascular exam He may proceed with abdominal surgery/foot surgery with low overall cardiac risk based upon his most recent nuclear stress test that was low risk with no ischemia.  Pump function also has normalized.  He does have coronary artery calcification present and is currently stabilized Crestor 10 mg.  Chronic anticoagulation Continuing with Eliquis 5 mg twice a day.  Once he reaches 80, given his creatinine of 1.5, we will reduce dosage to 2.5 mg twice a day.  He may hold his Eliquis for any prior upcoming surgery for 2-3 days prior.   Medication Adjustments/Labs and Tests Ordered: Current medicines are reviewed at length with the patient today.  Concerns regarding medicines are outlined above.  Orders Placed This Encounter  Procedures   CT Chest Wo  Contrast    No orders of the defined types were placed in this encounter.   Patient Instructions  Medication Instructions:  The current medical regimen is effective;  continue present plan and medications.  *If you need a refill on your cardiac medications before your next appointment, please call your pharmacy*  Testing/Procedures: You physician would like for you to have a chest CT scanning in 1 year, (CAT scanning), is a noninvasive, special x-ray that produces cross-sectional images of the body using x-rays and a computer. CT scans help physicians diagnose and treat medical conditions. CT scans provide greater clarity and reveal more details than regular x-ray exams.  Follow-Up: At [facility], you and your health needs are our priority.  As part of our continuing mission to provide you with exceptional heart care, we have created designated Provider Care Teams.  These Care Teams include your primary Cardiologist (  physician) and Advanced Practice Providers (APPs -  Physician Assistants and Nurse Practitioners) who all work together to provide you with the care you need, when you need it.  We recommend signing up for the patient portal called "MyChart".  Sign up information is provided on this After Visit Summary.  MyChart is used to connect with patients for Virtual Visits (Telemedicine).  Patients are able to view lab/test results, encounter notes, upcoming appointments, etc.  Non-urgent messages can be sent to your provider as well.   To learn more about what you can do with MyChart, go to [email or web address].    Your next appointment:   1 year(s)  The format for your next appointment:   In Person  Provider:   Candee Furbish, MD   Thank you for choosing [facility]!!     Signed, Candee Furbish, MD  04/21/2021 9:23 AM    [place]

## 2021-04-21 NOTE — Assessment & Plan Note (Signed)
He may proceed with abdominal surgery/foot surgery with low overall cardiac risk based upon his most recent nuclear stress test that was low risk with no ischemia.  Pump function also has normalized.  He does have coronary artery calcification present and is currently stabilized Crestor 10 mg.

## 2021-04-21 NOTE — Assessment & Plan Note (Signed)
Coronary artery calcification noted with significant LAD territory plaque on CT scan.  Thankfully, pharmacologic stress test showed no evidence of ischemia.  Excellent.  Continue with goal-directed medical therapy, he is currently on Crestor 10 mg daily and his LDL is 25.  Excellent.  No myalgias.

## 2021-04-23 DIAGNOSIS — D631 Anemia in chronic kidney disease: Secondary | ICD-10-CM | POA: Diagnosis not present

## 2021-04-23 DIAGNOSIS — N1832 Chronic kidney disease, stage 3b: Secondary | ICD-10-CM | POA: Diagnosis not present

## 2021-04-23 DIAGNOSIS — I129 Hypertensive chronic kidney disease with stage 1 through stage 4 chronic kidney disease, or unspecified chronic kidney disease: Secondary | ICD-10-CM | POA: Diagnosis not present

## 2021-04-23 DIAGNOSIS — N2581 Secondary hyperparathyroidism of renal origin: Secondary | ICD-10-CM | POA: Diagnosis not present

## 2021-04-24 DIAGNOSIS — Z9889 Other specified postprocedural states: Secondary | ICD-10-CM | POA: Diagnosis not present

## 2021-04-24 DIAGNOSIS — C184 Malignant neoplasm of transverse colon: Secondary | ICD-10-CM | POA: Diagnosis not present

## 2021-04-24 DIAGNOSIS — N1832 Chronic kidney disease, stage 3b: Secondary | ICD-10-CM | POA: Diagnosis not present

## 2021-05-12 DIAGNOSIS — Z6827 Body mass index (BMI) 27.0-27.9, adult: Secondary | ICD-10-CM | POA: Diagnosis not present

## 2021-05-12 DIAGNOSIS — G894 Chronic pain syndrome: Secondary | ICD-10-CM | POA: Diagnosis not present

## 2021-05-22 ENCOUNTER — Ambulatory Visit (AMBULATORY_SURGERY_CENTER): Payer: Medicare Other | Admitting: Internal Medicine

## 2021-05-22 ENCOUNTER — Encounter: Payer: Self-pay | Admitting: Internal Medicine

## 2021-05-22 VITALS — BP 111/54 | HR 63 | Temp 98.6°F | Resp 14 | Ht 72.0 in | Wt 206.0 lb

## 2021-05-22 DIAGNOSIS — K5080 Crohn's disease of both small and large intestine without complications: Secondary | ICD-10-CM | POA: Diagnosis not present

## 2021-05-22 DIAGNOSIS — D124 Benign neoplasm of descending colon: Secondary | ICD-10-CM | POA: Diagnosis not present

## 2021-05-22 DIAGNOSIS — Z85038 Personal history of other malignant neoplasm of large intestine: Secondary | ICD-10-CM | POA: Diagnosis not present

## 2021-05-22 DIAGNOSIS — M7989 Other specified soft tissue disorders: Secondary | ICD-10-CM | POA: Diagnosis not present

## 2021-05-22 DIAGNOSIS — K9189 Other postprocedural complications and disorders of digestive system: Secondary | ICD-10-CM | POA: Diagnosis not present

## 2021-05-22 DIAGNOSIS — Z86010 Personal history of colonic polyps: Secondary | ICD-10-CM | POA: Diagnosis not present

## 2021-05-22 MED ORDER — SUCRALFATE 1 GM/10ML PO SUSP: 1.0000 g | Freq: Every evening | ORAL | 11 refills | Status: DC | PRN

## 2021-05-22 MED ORDER — SODIUM CHLORIDE 0.9 % IV SOLN: 500.0000 mL | Freq: Once | INTRAVENOUS | Status: DC

## 2021-05-22 NOTE — Op Note (Signed)
[place] Patient Name: Derek [name] Procedure Date: 05/22/2021 9:20 AM MRN: [id number] Endoscopist: [name] , MD Age: 80 Referring MD:  Date of Birth: [birth year] Gender: Male Account #: [id number] Procedure:                Colonoscopy Indications:              High risk colon cancer surveillance: Personal                            history of colon cancer, Last colonoscopy: 2019 Medicines:                Propofol per Anesthesia, Monitored Anesthesia Care Procedure:                Pre-Anesthesia Assessment:                           - Prior to the procedure, a History and Physical                            was performed, and patient medications and                            allergies were reviewed. The patient's tolerance of                            previous anesthesia was also reviewed. The risks                            and benefits of the procedure and the sedation                            options and risks were discussed with the patient.                            All questions were answered, and informed consent                            was obtained. Prior Anticoagulants: The patient                            last took Eliquis (apixaban) 2 days prior to the                            procedure. ASA Grade Assessment: III - A patient                            with severe systemic disease. After reviewing the                            risks and benefits, the patient was deemed in                            satisfactory condition to undergo the procedure.  After obtaining informed consent, the colonoscope                            was passed under direct vision. Throughout the                            procedure, the patient's blood pressure, pulse, and                            oxygen saturations were monitored continuously. The                            Olympus PCF-H190DL (#[id number]) Colonoscope was                             introduced through the anus and advanced to the the                            ileocolonic anastomosis. The colonoscopy was                            performed without difficulty. The patient tolerated                            the procedure well. The quality of the bowel                            preparation was good. The rectum and Ileocolonic                            anastomsis areas were photographed. Scope In: 9:44:51 AM Scope Out: 10:02:52 AM Scope Withdrawal Time: 0 hours 13 minutes 54 seconds  Total Procedure Duration: 0 hours 18 minutes 1 second  Findings:                 The perianal and digital rectal examinations were                            normal.                           There was evidence of a prior end-to-end                            colo-colonic anastomosis in the transverse colon.                            This was patent and was characterized by                            inflammation and visible sutures. The anastomosis                            was traversed. Biopsies were taken with a cold  forceps for histology. Verification of patient                            identification for the specimen was done. Estimated                            blood loss was minimal.                           A diminutive polyp was found in the descending                            colon. The polyp was sessile. The polyp was removed                            with a cold snare. Resection was complete, but the                            polyp tissue was not retrieved. Estimated blood                            loss was minimal.                           External and internal hemorrhoids were found. The                            hemorrhoids were small.                           The exam was otherwise without abnormality on                            direct and retroflexion views. Complications:            No immediate  complications. Estimated Blood Loss:     Estimated blood loss was minimal. Impression:               - Patent end-to-end colo-colonic anastomosis,                            characterized by inflammation and visible sutures.                            Biopsied.                           - One diminutive polyp in the descending colon,                            removed with a cold snare. Complete resection.                            Polyp tissue not retrieved.                           -  External and internal hemorrhoids.                           - The examination was otherwise normal on direct                            and retroflexion views.                           - Personal hx CRCA and Crohn's ileitis Recommendation:           - Patient has a contact number available for                            emergencies. The signs and symptoms of potential                            delayed complications were discussed with the                            patient. Return to normal activities tomorrow.                            Written discharge instructions were provided to the                            patient.                           - No repeat colonoscopy due to age.                           - Await pathology results.                           - Resume Eliquis (apixaban) at prior dose tomorrow. [name], MD 05/22/2021 10:12:55 AM This report has been signed electronically.

## 2021-05-22 NOTE — Progress Notes (Signed)
Called to room to assist during endoscopic procedure.  Patient ID and intended procedure confirmed with present staff. Received instructions for my participation in the procedure from the performing physician.  

## 2021-05-22 NOTE — Progress Notes (Signed)
[facility]

## 2021-05-22 NOTE — Patient Instructions (Addendum)
I found and removed one tiny colon polyp - it looked benign. It was not retrieved.  There was slight inflammation where the colon and small intestine are joined. Not bad at all. I took biopsies.  The abdominal lesion looks small and hopefully it will close up. Keep follow-up with surgeons.  I will let you know pathology results - I am not recommending a repeat colonoscopy on a routine basis.  I appreciate the opportunity to care for you.  [name], MD, [facility]   Handout given for polyps.  RESUME ELIQUIS TOMORROW, Friday 05/23/21.  You will not need another colonoscopy, however, please call and make an appointment with Dr Carlean Purl,  [phone], if you have a change in bowel habits, change in family history of colo-rectal cancer, rectal bleeding or other GI concern in the future.    YOU HAD AN ENDOSCOPIC PROCEDURE TODAY AT [facility]:   Refer to the procedure report that was given to you for any specific questions about what was found during the examination.  If the procedure report does not answer your questions, please call your gastroenterologist to clarify.  If you requested that your care partner not be given the details of your procedure findings, then the procedure report has been included in a sealed envelope for you to review at your convenience later.  YOU SHOULD EXPECT: Some feelings of bloating in the abdomen. Passage of more gas than usual.  Walking can help get rid of the air that was put into your GI tract during the procedure and reduce the bloating. If you had a lower endoscopy (such as a colonoscopy or flexible sigmoidoscopy) you may notice spotting of blood in your stool or on the toilet paper. If you underwent a bowel prep for your procedure, you may not have a normal bowel movement for a few days.  Please Note:  You might notice some irritation and congestion in your nose or some drainage.  This is from the oxygen used during your procedure.  There  is no need for concern and it should clear up in a day or so.  SYMPTOMS TO REPORT IMMEDIATELY:  Following lower endoscopy (colonoscopy or flexible sigmoidoscopy):  Excessive amounts of blood in the stool  Significant tenderness or worsening of abdominal pains  Swelling of the abdomen that is new, acute  Fever of 100F or higher  For urgent or emergent issues, a gastroenterologist can be reached at any hour by calling [phone]. Do not use MyChart messaging for urgent concerns.    DIET:  We do recommend a small meal at first, but then you may proceed to your regular diet.  Drink plenty of fluids but you should avoid alcoholic beverages for 24 hours.  ACTIVITY:  You should plan to take it easy for the rest of today and you should NOT DRIVE or use heavy machinery until tomorrow (because of the sedation medicines used during the test).    FOLLOW UP: Our staff will call the number listed on your records 48-72 hours following your procedure to check on you and address any questions or concerns that you may have regarding the information given to you following your procedure. If we do not reach you, we will leave a message.  We will attempt to reach you two times.  During this call, we will ask if you have developed any symptoms of COVID 19. If you develop any symptoms (ie: fever, flu-like symptoms, shortness of breath, cough etc.) before  then, please call [phone].  If you test positive for Covid 19 in the 2 weeks post procedure, please call and report this information to [place].    If any biopsies were taken you will be contacted by phone or by letter within the next 1-3 weeks.  Please call us at [phone] if you have not heard about the biopsies in 3 weeks.    SIGNATURES/CONFIDENTIALITY: You and/or your care partner have signed paperwork which will be entered into your electronic medical record.  These signatures attest to the fact that that the information above on your After Visit  Summary has been reviewed and is understood.  Full responsibility of the confidentiality of this discharge information lies with you and/or your care-partner.

## 2021-05-22 NOTE — Progress Notes (Signed)
[facility] History and Physical   Primary Care Physician:  Sharilyn Sites, MD   Reason for Procedure:   Hx colon cancer and Crohn's disease  Plan:    colonoscopy     HPI: Derek [name] is a 80 y.o. male w/ hx colon cancer and Crohn's diesease here for a surveillance colonoscopy   Past Medical History:  Diagnosis Date   Allergy    Anemia    Anxiety    Aortic insufficiency    a. mild-mod by echo 09/2015.   Arthritis    "knees; left shoulder" (09/21/2013)   Ascending aortic aneurysm    a. last measurement 5.3 cm 03/2016 -> f/u planned 09/2015 to continue to follow.   Atrial fibrillation ([place])    B12 deficiency    takes Vit 12 shot every 14days    Bowel perforation ([place]) 08/27/2016   Cataract    CKD (chronic kidney disease) stage 3, GFR 30-59 ml/min (HCC) 08/22/2011   Clotting disorder ([place])    DVT years ago   Colon cancer ([place])    Colonic ischemia ([place]) 08/27/2016   Crohn's disease ([place])    Depression    Enlarged prostate    Enteric hyperoxaluria 02/21/2016   GERD (gastroesophageal reflux disease)    takes Omeprazole daily   Gout    takes Uloric and Colchicine daily   Heart murmur    Hepatitis C 1978   negtive RNA load - spontaneously cleared   Hiatal hernia    High output ileostomy ([place]) 06/02/2017   History of blood transfusion 1978; 1990's; ?   "w/bowel resection; S/P allupurinol; ?" (09/21/2013)   History of colon polyps    History of kidney stones    History of MRSA infection 2010   History of pulmonary embolism 2006   both legs and both lungs /notes 08/26/2008 (09/21/2013)   History of small bowel obstruction    History of staph infection 1978   Hyperoxaluria    Intestinal   Hypertension    Insomnia    takes Trazodone nightly   Internal hemorrhoids    LV dysfunction    a. h/o EF 45-50% in 2015, normalized on subsequent echoes.   Nephrolithiasis    Nocardia infection    Pancreatitis 2010   elevated lipase and amylase, stranding in  tail of pancreas, ? from Humira   Pancytopenia    Hx of   Peripheral neuropathy    takes Gabapentin daily   Pneumonia    hx of    Post-traumatic stress syndrome    takes Paxil nightly   PTSD (post-traumatic stress disorder)    Pulmonary nodule    a. 2m by CT 05/2015, recommended f/u 6-12 months.   RLS (restless legs syndrome)    Rosacea conjunctivitis(372.31)    takes Minocin daily   Secondary hyperparathyroidism ([place] 02/21/2016   Shingles    Shingles    Sinus bradycardia    Skin cancer    "cut/burned off left ear and face" (09/21/2013)   [place] bowel obstruction ([place]    Status post reversal of ileostomy 07/23/2017   Thrombocytopenia ([place]    hx of   Thyroid disease    per wife pt is supposed to take a med for tyroid but he doesn't take it    Past Surgical History:  Procedure Laterality Date   [place] SURG[place] Right    [place]   CARDIOVERSION N/A 05/29/2016   Procedure: CARDIOVERSION;  Surgeon: MDani Gobble  Croitoru, MD;  Location: [facility];  Service: Cardiovascular;  Laterality: N/A;   CHOLECYSTECTOMY     Colon Cancer     COLON RESECTION N/A 08/14/2016   Procedure: LAPAROSCOPIC RESECTION TRANSVERSE COLON;  Surgeon: [name], MD;  Location: [facility];  Service: General;  Laterality: N/A;   COLON SURGERY     COLONOSCOPY     ESOPHAGOGASTRODUODENOSCOPY     EYE SURGERY     cataract surgery bilateral   FOOT SURGERY Right    "took gout out"   HEMICOLECTOMY Right    ILEOCECETOMY  1978   [name] 05/10/2000  (09/21/2013)   ILEOSTOMY     ILEOSTOMY CLOSURE N/A 07/23/2017   Procedure: ILEOSTOMY REVERSAL ;  Surgeon: [name], MD;  Location: [facility];  Service: General;  Laterality: N/A;   INGUINAL HERNIA REPAIR Right    IR FLUORO GUIDE CV LINE RIGHT  05/07/2017   IR REMOVAL TUN CV CATH W/O FL  10/08/2017   IR US GUIDE VASC ACCESS RIGHT  05/07/2017   KNEE ARTHROSCOPY Left    LAPAROTOMY N/A 08/27/2016   Procedure: EXPLORATORYLAPAROTOMY, LYSIS  OF ADHESIONS, ILEOSTOMY, RIGHT COLECTOMY;  Surgeon: [name], MD;  Location: [facility];  Service: General;  Laterality: N/A;   LIGAMENT REPAIR Left    POLYPECTOMY     TEE WITHOUT CARDIOVERSION N/A 05/29/2016   Procedure: TRANSESOPHAGEAL ECHOCARDIOGRAM (TEE);  Surgeon: Sanda Klein, MD;  Location: [facility];  Service: Cardiovascular;  Laterality: N/A;   TOTAL SHOULDER ARTHROPLASTY Left 09/21/2013   TOTAL SHOULDER ARTHROPLASTY Left 09/21/2013   Procedure: LEFT TOTAL SHOULDER ARTHROPLASTY;  Surgeon: Marin Shutter, MD;  Location: [facility];  Service: Orthopedics;  Laterality: Left;    Prior to Admission medications   Medication Sig Start Date End Date Taking? Authorizing Provider  Acidophilus Lactobacillus CAPS Take 1 capsule by mouth daily. 06/08/16  Yes [name], MD  amiodarone (PACERONE) 200 MG tablet Take 1 tablet (200 mg total) by mouth daily. 10/30/20  Yes Jerline Pain, MD  calcium carbonate (TUMS - DOSED IN MG ELEMENTAL CALCIUM) 500 MG chewable tablet Chew 2 tablets by mouth 2 (two) times daily.   Yes [provider]  Cholecalciferol (VITAMIN D) 2000 units CAPS Take 2,000 Units by mouth daily.   Yes [provider]  ELIQUIS 5 MG TABS tablet Take 1 tablet (5 mg total) by mouth 2 (two) times daily. 11/05/16  Yes End, Harrell Gave, MD  famotidine (PEPCID) 20 MG tablet Take 20 mg by mouth daily.   Yes [provider]  febuxostat (ULORIC) 40 MG tablet Take 40 mg by mouth daily.   Yes [provider]  HYDROcodone-acetaminophen (NORCO) 10-325 MG tablet Take 1-2 tablets by mouth 4 (four) times daily as needed. 12/05/19  Yes [provider]  magnesium oxide (MAG-OX) 400 (241.3 Mg) MG tablet Take 1,200 mg by mouth 2 (two) times daily.    Yes [provider]  Multiple Vitamin (MULTIVITAMIN WITH MINERALS) TABS tablet Take 1 tablet by mouth daily.   Yes [provider]  PARoxetine (PAXIL) 40 MG tablet TAKE ONE-HALF TABLET BY MOUTH ONCE A  DAY FOR MENTAL HEALTH 05/07/21  Yes [provider]  rosuvastatin (CRESTOR) 10 MG tablet Take 1 tablet (10 mg total) by mouth daily. 10/17/20  Yes Jerline Pain, MD  sodium bicarbonate 650 MG tablet Take 1,300 mg by mouth 2 (two) times daily.    Yes [provider]  traZODone (DESYREL) 150 MG tablet Take 1 tablet by mouth  at bedtime. 05/07/21  Yes [provider]  cyanocobalamin (,VITAMIN B-12,) 1000 MCG/ML injection Inject 1,000 mcg into the muscle. THREE TIMES A MONTH    [provider]  fluticasone (CUTIVATE) 0.05 % cream Apply topically 2 (two) times daily. 02/24/21   [provider]  levocetirizine (XYZAL) 5 MG tablet Take 1 tablet by mouth every evening. 03/13/21   [provider]  lidocaine (LIDODERM) 5 % Place 1 patch onto the skin daily. Remove & Discard patch within 12 hours or as directed by MD 04/02/20   Margarita Mail, PA-C  predniSONE (DELTASONE) 50 MG tablet Take 1 tablet (50 mg total) by mouth daily with breakfast. Patient not taking: Reported on 05/22/2021 03/31/21   Tat, Shanon Brow, MD  sucralfate (CARAFATE) 1 GM/10ML suspension Take 10 mLs (1 g total) by mouth 4 (four) times daily as needed. Patient not taking: Reported on 05/22/2021 03/03/21   [name], MD  Calcium Carbonate (CALCIUM 500 PO) Take 2 tablets by mouth 2 (two) times daily.   05/11/16  [provider]    Current Outpatient Medications  Medication Sig Dispense Refill   Acidophilus Lactobacillus CAPS Take 1 capsule by mouth daily. 90 capsule 3   amiodarone (PACERONE) 200 MG tablet Take 1 tablet (200 mg total) by mouth daily. 90 tablet 3   calcium carbonate (TUMS - DOSED IN MG ELEMENTAL CALCIUM) 500 MG chewable tablet Chew 2 tablets by mouth 2 (two) times daily.     Cholecalciferol (VITAMIN D) 2000 units CAPS Take 2,000 Units by mouth daily.     ELIQUIS 5 MG TABS tablet Take 1 tablet (5 mg total) by mouth 2 (two) times daily. 180 tablet 3   famotidine (PEPCID)  20 MG tablet Take 20 mg by mouth daily.     febuxostat (ULORIC) 40 MG tablet Take 40 mg by mouth daily.     HYDROcodone-acetaminophen (NORCO) 10-325 MG tablet Take 1-2 tablets by mouth 4 (four) times daily as needed.     magnesium oxide (MAG-OX) 400 (241.3 Mg) MG tablet Take 1,200 mg by mouth 2 (two) times daily.      Multiple Vitamin (MULTIVITAMIN WITH MINERALS) TABS tablet Take 1 tablet by mouth daily.     PARoxetine (PAXIL) 40 MG tablet TAKE ONE-HALF TABLET BY MOUTH ONCE A DAY FOR MENTAL HEALTH     rosuvastatin (CRESTOR) 10 MG tablet Take 1 tablet (10 mg total) by mouth daily. 90 tablet 3   sodium bicarbonate 650 MG tablet Take 1,300 mg by mouth 2 (two) times daily.      traZODone (DESYREL) 150 MG tablet Take 1 tablet by mouth at bedtime.     cyanocobalamin (,VITAMIN B-12,) 1000 MCG/ML injection Inject 1,000 mcg into the muscle. THREE TIMES A MONTH     fluticasone (CUTIVATE) 0.05 % cream Apply topically 2 (two) times daily.     levocetirizine (XYZAL) 5 MG tablet Take 1 tablet by mouth every evening.     lidocaine (LIDODERM) 5 % Place 1 patch onto the skin daily. Remove & Discard patch within 12 hours or as directed by MD 30 patch 0   predniSONE (DELTASONE) 50 MG tablet Take 1 tablet (50 mg total) by mouth daily with breakfast. (Patient not taking: Reported on 05/22/2021) 7 tablet 0   sucralfate (CARAFATE) 1 GM/10ML suspension Take 10 mLs (1 g total) by mouth 4 (four) times daily as needed. (Patient not taking: Reported on 05/22/2021) 420 mL 11   Current Facility-Administered Medications  Medication Dose Route Frequency  Provider Last Rate Last Admin   0.9 %  sodium chloride infusion  500 mL Intravenous Once [name], MD        Allergies as of 05/22/2021 - Review Complete 05/22/2021  Allergen Reaction Noted   Lorazepam Other (See Comments)    Humira [adalimumab] Other (See Comments) 05/11/2016   Calcium d-glucarate [calcium saccharate]  07/31/2020   Quinolones  03/10/2018     Family History  Problem Relation Age of Onset   Kidney disease Father    Hypertension Father    Aneurysm Mother    Aneurysm Sister    Esophageal cancer Neg Hx    Stomach cancer Neg Hx    Rectal cancer Neg Hx    Colon cancer Neg Hx     Social History   Socioeconomic History   Marital status: Married    Spouse name: [name]   Number of children: 2   Years of education: Not on file   Highest education level: Not on file  Occupational History   Occupation: Retired  Tobacco Use   Smoking status: Former    Packs/day: 2.00    Years: 20.00    Pack years: 40.00    Types: Cigarettes    Quit date: 08/04/1975    Years since quitting: 45.8   Smokeless tobacco: Former    Types: Chew    Quit date: 08/03/1978   Tobacco comments:    09/21/2013 "quit smoking in the late 1970's; stopped chewing couple years after I quit smoking"  Vaping Use   Vaping Use: Never used  Substance and Sexual Activity   Alcohol use: No   Drug use: No   Sexual activity: Not Currently  Other Topics Concern   Not on file  Social History Narrative   Married 2 children and 6 grandchildren all local   [place] [employer]   Daily caffeine   Does not exercise regularly    Review of Systems:  All other review of systems negative except as mentioned in the HPI.  Physical Exam: Vital signs BP 123/87   Pulse 78   Temp 98.6 F (37 C) (Temporal)   Ht 6' (1.829 m)   Wt 206 lb (93.4 kg)   SpO2 97%   BMI 27.94 kg/m   General:   Alert,  Well-developed, well-nourished, pleasant and cooperative in NAD Lungs:  Clear throughout to auscultation.   Heart:  Regular rate and rhythm; no murmurs, clicks, rubs,  or gallops. Abdomen:  Soft, nontender and nondistended. Normal bowel sounds.  Pinhead sized opening w/ slight serous dc in mid lower abdomen Neuro/Psych:  Alert and cooperative. Normal mood and affect. A and O x 3   @Willaim Mode  Simonne Maffucci, MD, [facility] [phone] (pager) 05/22/2021 9:31  AM@

## 2021-05-22 NOTE — Progress Notes (Signed)
To pacu, VSS. Report to [email or web address] 

## 2021-05-26 ENCOUNTER — Telehealth: Payer: Self-pay

## 2021-05-26 NOTE — Telephone Encounter (Signed)
  Follow up Call-  Call back number 05/22/2021  Post procedure Call Back phone  # [phone]  Permission to leave phone message Yes  Some recent data might be hidden     Patient questions:  Do you have a fever, pain , or abdominal swelling? No. Pain Score  0 *  Have you tolerated food without any problems? Yes.    Have you been able to return to your normal activities? Yes.    Do you have any questions about your discharge instructions: Diet   No. Medications  No. Follow up visit  No.  Do you have questions or concerns about your Care? No.  Actions: * If pain score is 4 or above: No action needed, pain <4.  Have you developed a fever since your procedure? no  2.   Have you had an respiratory symptoms (SOB or cough) since your procedure? no  3.   Have you tested positive for COVID 19 since your procedure no  4.   Have you had any family members/close contacts diagnosed with the COVID 19 since your procedure?  no   If yes to any of these questions please route to Joylene John, RN and Joella Prince, RN

## 2021-05-28 DIAGNOSIS — K08424 Partial loss of teeth due to periodontal diseases, class IV: Secondary | ICD-10-CM | POA: Insufficient documentation

## 2021-05-28 DIAGNOSIS — R531 Weakness: Secondary | ICD-10-CM | POA: Insufficient documentation

## 2021-05-28 DIAGNOSIS — L7682 Other postprocedural complications of skin and subcutaneous tissue: Secondary | ICD-10-CM | POA: Diagnosis not present

## 2021-05-28 DIAGNOSIS — D510 Vitamin B12 deficiency anemia due to intrinsic factor deficiency: Secondary | ICD-10-CM | POA: Insufficient documentation

## 2021-05-28 DIAGNOSIS — R091 Pleurisy: Secondary | ICD-10-CM | POA: Insufficient documentation

## 2021-05-28 DIAGNOSIS — E781 Pure hyperglyceridemia: Secondary | ICD-10-CM | POA: Insufficient documentation

## 2021-05-28 DIAGNOSIS — K047 Periapical abscess without sinus: Secondary | ICD-10-CM | POA: Insufficient documentation

## 2021-05-28 DIAGNOSIS — K922 Gastrointestinal hemorrhage, unspecified: Secondary | ICD-10-CM | POA: Insufficient documentation

## 2021-05-28 DIAGNOSIS — I482 Chronic atrial fibrillation, unspecified: Secondary | ICD-10-CM | POA: Insufficient documentation

## 2021-05-28 DIAGNOSIS — B182 Chronic viral hepatitis C: Secondary | ICD-10-CM | POA: Diagnosis present

## 2021-05-28 DIAGNOSIS — M2021 Hallux rigidus, right foot: Secondary | ICD-10-CM | POA: Insufficient documentation

## 2021-05-28 DIAGNOSIS — G4700 Insomnia, unspecified: Secondary | ICD-10-CM | POA: Diagnosis present

## 2021-05-28 DIAGNOSIS — L718 Other rosacea: Secondary | ICD-10-CM | POA: Insufficient documentation

## 2021-06-01 ENCOUNTER — Encounter: Payer: Self-pay | Admitting: Internal Medicine

## 2021-06-03 HISTORY — PX: OTHER SURGICAL HISTORY: SHX169

## 2021-06-06 DIAGNOSIS — E663 Overweight: Secondary | ICD-10-CM | POA: Diagnosis not present

## 2021-06-06 DIAGNOSIS — D72819 Decreased white blood cell count, unspecified: Secondary | ICD-10-CM | POA: Diagnosis not present

## 2021-06-06 DIAGNOSIS — R7309 Other abnormal glucose: Secondary | ICD-10-CM | POA: Diagnosis not present

## 2021-06-06 DIAGNOSIS — G894 Chronic pain syndrome: Secondary | ICD-10-CM | POA: Diagnosis not present

## 2021-06-06 DIAGNOSIS — B1710 Acute hepatitis C without hepatic coma: Secondary | ICD-10-CM | POA: Diagnosis not present

## 2021-06-06 DIAGNOSIS — A419 Sepsis, unspecified organism: Secondary | ICD-10-CM | POA: Diagnosis not present

## 2021-06-06 DIAGNOSIS — K5180 Other ulcerative colitis without complications: Secondary | ICD-10-CM | POA: Diagnosis not present

## 2021-06-06 DIAGNOSIS — D61818 Other pancytopenia: Secondary | ICD-10-CM | POA: Diagnosis not present

## 2021-06-06 DIAGNOSIS — D6949 Other primary thrombocytopenia: Secondary | ICD-10-CM | POA: Diagnosis not present

## 2021-06-06 DIAGNOSIS — E039 Hypothyroidism, unspecified: Secondary | ICD-10-CM | POA: Diagnosis not present

## 2021-06-06 DIAGNOSIS — Z6828 Body mass index (BMI) 28.0-28.9, adult: Secondary | ICD-10-CM | POA: Diagnosis not present

## 2021-06-06 DIAGNOSIS — I77810 Thoracic aortic ectasia: Secondary | ICD-10-CM | POA: Diagnosis not present

## 2021-06-11 NOTE — Telephone Encounter (Signed)
    Patient Name: Derek [name]  DOB: Jul 26, 1941 MRN: [id number]  Primary Cardiologist: Candee Furbish, MD  Chart reviewed as part of pre-operative protocol coverage. Patient was seen by Dr. Marlou Porch on 04/21/2021 at which time he discussed need for upcoming GI surgery as well as upcoming right foot surgery. Per Dr. Kingsley Plan office note, "He may proceed with abdominal surgery/foot surgery with low overall cardiac risk based upon his most recent nuclear stress test that was low risk with no ischemia.  Pump function also has normalized."  Per Pharmacy and office protocol, patient can hold Eliquis for 1 day prior to procedure. Please restart this as soon as possible afterwards.   I will route this recommendation to the requesting party via Epic fax function and remove from pre-op pool.  Please call with questions.  Darreld Mclean, PA-C 06/11/2021, 4:53 PM

## 2021-06-11 NOTE — Telephone Encounter (Signed)
Requesting office is requesting the preop clearance notes be faxed to [phone].

## 2021-06-12 DIAGNOSIS — Z20828 Contact with and (suspected) exposure to other viral communicable diseases: Secondary | ICD-10-CM | POA: Diagnosis not present

## 2021-06-16 DIAGNOSIS — L819 Disorder of pigmentation, unspecified: Secondary | ICD-10-CM | POA: Diagnosis not present

## 2021-06-16 DIAGNOSIS — L814 Other melanin hyperpigmentation: Secondary | ICD-10-CM | POA: Diagnosis not present

## 2021-06-16 DIAGNOSIS — L821 Other seborrheic keratosis: Secondary | ICD-10-CM | POA: Diagnosis not present

## 2021-06-16 DIAGNOSIS — D225 Melanocytic nevi of trunk: Secondary | ICD-10-CM | POA: Diagnosis not present

## 2021-06-16 DIAGNOSIS — L570 Actinic keratosis: Secondary | ICD-10-CM | POA: Diagnosis not present

## 2021-06-16 DIAGNOSIS — Z872 Personal history of diseases of the skin and subcutaneous tissue: Secondary | ICD-10-CM | POA: Diagnosis not present

## 2021-06-17 DIAGNOSIS — M109 Gout, unspecified: Secondary | ICD-10-CM | POA: Diagnosis not present

## 2021-06-17 DIAGNOSIS — M79672 Pain in left foot: Secondary | ICD-10-CM | POA: Diagnosis not present

## 2021-06-17 DIAGNOSIS — M79671 Pain in right foot: Secondary | ICD-10-CM | POA: Diagnosis not present

## 2021-06-17 DIAGNOSIS — M205X1 Other deformities of toe(s) (acquired), right foot: Secondary | ICD-10-CM | POA: Diagnosis not present

## 2021-06-19 DIAGNOSIS — I129 Hypertensive chronic kidney disease with stage 1 through stage 4 chronic kidney disease, or unspecified chronic kidney disease: Secondary | ICD-10-CM | POA: Diagnosis not present

## 2021-06-19 DIAGNOSIS — Z86718 Personal history of other venous thrombosis and embolism: Secondary | ICD-10-CM | POA: Diagnosis not present

## 2021-06-19 DIAGNOSIS — G8929 Other chronic pain: Secondary | ICD-10-CM | POA: Diagnosis not present

## 2021-06-19 DIAGNOSIS — G4700 Insomnia, unspecified: Secondary | ICD-10-CM | POA: Diagnosis not present

## 2021-06-19 DIAGNOSIS — M7989 Other specified soft tissue disorders: Secondary | ICD-10-CM | POA: Diagnosis not present

## 2021-06-19 DIAGNOSIS — M79671 Pain in right foot: Secondary | ICD-10-CM | POA: Diagnosis not present

## 2021-06-19 DIAGNOSIS — F419 Anxiety disorder, unspecified: Secondary | ICD-10-CM | POA: Diagnosis not present

## 2021-06-19 DIAGNOSIS — I2510 Atherosclerotic heart disease of native coronary artery without angina pectoris: Secondary | ICD-10-CM | POA: Diagnosis not present

## 2021-06-19 DIAGNOSIS — M109 Gout, unspecified: Secondary | ICD-10-CM | POA: Diagnosis not present

## 2021-06-19 DIAGNOSIS — M79674 Pain in right toe(s): Secondary | ICD-10-CM | POA: Diagnosis not present

## 2021-06-19 DIAGNOSIS — M205X1 Other deformities of toe(s) (acquired), right foot: Secondary | ICD-10-CM | POA: Diagnosis not present

## 2021-06-19 DIAGNOSIS — N183 Chronic kidney disease, stage 3 unspecified: Secondary | ICD-10-CM | POA: Diagnosis not present

## 2021-07-08 DIAGNOSIS — Z4889 Encounter for other specified surgical aftercare: Secondary | ICD-10-CM | POA: Diagnosis not present

## 2021-07-09 DIAGNOSIS — S31109D Unspecified open wound of abdominal wall, unspecified quadrant without penetration into peritoneal cavity, subsequent encounter: Secondary | ICD-10-CM | POA: Diagnosis not present

## 2021-07-22 DIAGNOSIS — Z4889 Encounter for other specified surgical aftercare: Secondary | ICD-10-CM | POA: Diagnosis not present

## 2021-08-12 ENCOUNTER — Other Ambulatory Visit: Payer: Self-pay | Admitting: Internal Medicine

## 2021-08-12 ENCOUNTER — Other Ambulatory Visit (HOSPITAL_COMMUNITY): Payer: Self-pay | Admitting: Internal Medicine

## 2021-08-12 ENCOUNTER — Ambulatory Visit (HOSPITAL_COMMUNITY)
Admission: RE | Admit: 2021-08-12 | Discharge: 2021-08-12 | Disposition: A | Discharge status: Home / Self Care | Payer: Medicare Other | Source: Ambulatory Visit | Attending: Internal Medicine | Admitting: Internal Medicine

## 2021-08-12 ENCOUNTER — Other Ambulatory Visit: Payer: Self-pay

## 2021-08-12 DIAGNOSIS — E039 Hypothyroidism, unspecified: Secondary | ICD-10-CM | POA: Diagnosis not present

## 2021-08-12 DIAGNOSIS — E663 Overweight: Secondary | ICD-10-CM | POA: Diagnosis not present

## 2021-08-12 DIAGNOSIS — S0990XA Unspecified injury of head, initial encounter: Secondary | ICD-10-CM | POA: Diagnosis not present

## 2021-08-12 DIAGNOSIS — R4182 Altered mental status, unspecified: Secondary | ICD-10-CM

## 2021-08-12 DIAGNOSIS — N184 Chronic kidney disease, stage 4 (severe): Secondary | ICD-10-CM | POA: Diagnosis not present

## 2021-08-12 DIAGNOSIS — Z6827 Body mass index (BMI) 27.0-27.9, adult: Secondary | ICD-10-CM | POA: Diagnosis not present

## 2021-08-14 DIAGNOSIS — Z4889 Encounter for other specified surgical aftercare: Secondary | ICD-10-CM | POA: Diagnosis not present

## 2021-08-18 DIAGNOSIS — N390 Urinary tract infection, site not specified: Secondary | ICD-10-CM | POA: Diagnosis not present

## 2021-08-18 DIAGNOSIS — N1832 Chronic kidney disease, stage 3b: Secondary | ICD-10-CM | POA: Diagnosis not present

## 2021-08-21 ENCOUNTER — Telehealth: Payer: Self-pay | Admitting: *Deleted

## 2021-08-21 DIAGNOSIS — L0889 Other specified local infections of the skin and subcutaneous tissue: Secondary | ICD-10-CM | POA: Diagnosis not present

## 2021-08-21 DIAGNOSIS — T8131XA Disruption of external operation (surgical) wound, not elsewhere classified, initial encounter: Secondary | ICD-10-CM | POA: Diagnosis not present

## 2021-08-21 DIAGNOSIS — L7682 Other postprocedural complications of skin and subcutaneous tissue: Secondary | ICD-10-CM | POA: Diagnosis not present

## 2021-08-21 NOTE — Telephone Encounter (Signed)
° °  Pre-operative Risk Assessment    Patient Name: Derek [name]  DOB: 09-Aug-1940 MRN: [id number]      Request for Surgical Clearance    Procedure:   ABDOMINAL WOUND EXPLORATION SURGERY  Date of Surgery:  Clearance TBD                                 Surgeon:  DR. PAUL STECHSCHULTE Surgeon's Group or Practice Name:  [facility] Phone number:  [phone] Fax number:  [phone] ATTN: Malachi Bonds, CMA   Type of Clearance Requested:   - Medical  - Pharmacy:  Hold Apixaban (Eliquis)     Type of Anesthesia:  General    Additional requests/questions:    Jiles Prows   08/21/2021, 11:34 AM

## 2021-08-21 NOTE — Telephone Encounter (Signed)
° °  Name: Derek [name]  DOB: [birth year]  MRN: [id number]   Primary Cardiologist: Candee Furbish, MD  Chart reviewed as part of pre-operative protocol coverage. Patient was contacted 08/21/2021 in reference to pre-operative risk assessment for pending surgery as outlined below.  Derek [name] was last seen on 04/21/2021 by Dr. Marlou Porch.  Since that day, Derek [name] has done well without exertional chest pain or worsening dyspnea.  Dr. Marlou Porch has previously cleared the patient for abdominal surgery in September.  Since the last visit, he has not had any new changes from the cardiac perspective.  Patient may hold Eliquis for 2 to 3 days prior to the surgery and restart as soon as possible afterward at the surgeon's discretion.  Therefore, based on ACC/AHA guidelines, the patient would be at acceptable risk for the planned procedure without further cardiovascular testing.   The patient was advised that if he develops new symptoms prior to surgery to contact our office to arrange for a follow-up visit, and he verbalized understanding.  I will route this recommendation to the requesting party via Epic fax function and remove from pre-op pool. Please call with questions.  Almyra Deforest, [place] 08/21/2021, 6:35 PM

## 2021-08-21 NOTE — Telephone Encounter (Signed)
Patient with diagnosis of afib on Eliquis for anticoagulation.    Procedure: ABDOMINAL WOUND EXPLORATION SURGERY Date of procedure: TBD  CHA2DS2-VASc Score = 5  This indicates a 7.2% annual risk of stroke. The patient's score is based upon: CHF History: 1 HTN History: 1 Diabetes History: 0 Stroke History: 0 Vascular Disease History: 1 Age Score: 2 Gender Score: 0   Bilateral PE > 10 years ago  CrCl 38m/min Platelet count 112K  Per protocol, pt can hold Eliquis for 2-3 days (range given if surgeon has preference for either) prior to procedure.

## 2021-08-21 NOTE — Telephone Encounter (Signed)
Patient was last seen by Dr. Marlou Porch in September at which time he was cleared for both upcoming foot and abdominal surgery.  Per Dr. Marlou Porch note, "He may hold his Eliquis for any prior upcoming surgery for 2-3 days prior."  Clinical pharmacist to verify holding time for Eliquis, Dr. Marlou Porch recommendation was a general recommendation for both foot and abdominal surgery.

## 2021-08-22 DIAGNOSIS — Z6828 Body mass index (BMI) 28.0-28.9, adult: Secondary | ICD-10-CM | POA: Diagnosis not present

## 2021-08-22 DIAGNOSIS — B1710 Acute hepatitis C without hepatic coma: Secondary | ICD-10-CM | POA: Diagnosis not present

## 2021-08-22 DIAGNOSIS — R4182 Altered mental status, unspecified: Secondary | ICD-10-CM | POA: Diagnosis not present

## 2021-08-22 DIAGNOSIS — D61818 Other pancytopenia: Secondary | ICD-10-CM | POA: Diagnosis not present

## 2021-08-22 DIAGNOSIS — R7309 Other abnormal glucose: Secondary | ICD-10-CM | POA: Diagnosis not present

## 2021-08-22 DIAGNOSIS — D6949 Other primary thrombocytopenia: Secondary | ICD-10-CM | POA: Diagnosis not present

## 2021-08-22 DIAGNOSIS — Z0000 Encounter for general adult medical examination without abnormal findings: Secondary | ICD-10-CM | POA: Diagnosis not present

## 2021-08-22 DIAGNOSIS — A419 Sepsis, unspecified organism: Secondary | ICD-10-CM | POA: Diagnosis not present

## 2021-08-22 DIAGNOSIS — Z1331 Encounter for screening for depression: Secondary | ICD-10-CM | POA: Diagnosis not present

## 2021-08-22 DIAGNOSIS — S0990XA Unspecified injury of head, initial encounter: Secondary | ICD-10-CM | POA: Diagnosis not present

## 2021-08-22 DIAGNOSIS — E663 Overweight: Secondary | ICD-10-CM | POA: Diagnosis not present

## 2021-08-22 DIAGNOSIS — G894 Chronic pain syndrome: Secondary | ICD-10-CM | POA: Diagnosis not present

## 2021-08-26 DIAGNOSIS — I129 Hypertensive chronic kidney disease with stage 1 through stage 4 chronic kidney disease, or unspecified chronic kidney disease: Secondary | ICD-10-CM | POA: Diagnosis not present

## 2021-08-26 DIAGNOSIS — N2581 Secondary hyperparathyroidism of renal origin: Secondary | ICD-10-CM | POA: Diagnosis not present

## 2021-08-26 DIAGNOSIS — N1832 Chronic kidney disease, stage 3b: Secondary | ICD-10-CM | POA: Diagnosis not present

## 2021-08-26 DIAGNOSIS — D631 Anemia in chronic kidney disease: Secondary | ICD-10-CM | POA: Diagnosis not present

## 2021-09-01 ENCOUNTER — Encounter (HOSPITAL_BASED_OUTPATIENT_CLINIC_OR_DEPARTMENT_OTHER): Payer: Self-pay | Admitting: Surgery

## 2021-09-01 ENCOUNTER — Other Ambulatory Visit: Payer: Self-pay

## 2021-09-01 ENCOUNTER — Ambulatory Visit: Payer: Self-pay | Admitting: Surgery

## 2021-09-01 NOTE — Progress Notes (Addendum)
Spoke w/ via phone for pre-op interview---pt wife pam per pt request Lab needs dos---- Lab results------bmp 08-18-2021 dr patel on chart, cbc with dif 08-18-2021 dr patel on chart, intact pth 08-18-2021 dr patel on chart, cmp 08-12-2021 dr Hilma Favors on chart, cortisol 08-12-2021 dr Hilma Favors on chart, cortison and tsh 08-12-2021 dr Hilma Favors on chart,  COVID test -----patient wife pam states asymptomatic no test needed Arrive at -------730 am 09-05-2021 NPO after MN NO Solid Food.  Clear liquids from MN until---630 am Med rec completed Medications to take morning of surgery -----Synthroid, Feboxostat, hydrocodone prn, onedesetron prn,  colchicine prn, Amiodarone, Rosuvastatin, Famotidine Diabetic medication -----n/a Patient instructed no nail polish to be worn day of surgery Patient instructed to bring photo id and insurance card day of surgery Patient aware to have Driver (ride ) / caregiver    for 24 hours after surgery  wife [name] Patient Special Instructions -----none Pre-Op special Istructions -----none Patient verbalized understanding of instructions that were given at this phone interview. Patient denies shortness of breath,  any cardiac s & s chest pain, fever, cough at this phone interview.   Anesthesia Review: hx of ht, cad, dvt years ago, afib with chronic anticoagulation, hypothroidism hepatitis c that resolved without treatment, thrombocytopenia, ckd stage 3 b,  mild aortic insufficiency, ascending aortic aneurysm (less than 5 cm), chron's disease,  has cardiac clearance hao mang pa 08-21-2021 charte/epic, pt states he can lie flat and no cardiac s & s at pre op phoe call  Addendum: spoke with dr j singer mda and reviewed pt has active gout flare right heel and received decadron 10 mg shot 09-02-2021, ok to proceed with surgery [facility] 09-05-2021 per dr j singer mda.  PCP: dr Sharilyn Sites, lov benjamin mann pa 09-02-2021 on chart Cardiologist : dr m Rolla Plate 04-21-2021 epic Chest x-ray :1 view 03-29-2021  epic Chest ct 11-22-2019 epic EKG :03-29-2021 chart/epic Echo :09-06-2019 epic ef 60-65 % Stress test: 12-25-2020 epic low risk Cardiac Cath : none Activity level: walks around house and does chores only mobility issue is right foot ankle/heel pain Cardiothoracic lov  dr Fredrich Romans  11-21-2020 epic Sleep Study/ CPAP :none Lov nephrology 08-26-2021 dr patel on chartr Blood Thinner/ Instructions Maryjane Hurter Dose: note to stop eliquis hao meng pa 08-21-2021 chart/epic, pt and wife aware last dose to be today 09-01-2021

## 2021-09-02 DIAGNOSIS — E663 Overweight: Secondary | ICD-10-CM | POA: Diagnosis not present

## 2021-09-02 DIAGNOSIS — M79671 Pain in right foot: Secondary | ICD-10-CM | POA: Diagnosis not present

## 2021-09-02 DIAGNOSIS — M109 Gout, unspecified: Secondary | ICD-10-CM

## 2021-09-02 DIAGNOSIS — Z6828 Body mass index (BMI) 28.0-28.9, adult: Secondary | ICD-10-CM | POA: Diagnosis not present

## 2021-09-02 HISTORY — DX: Gout, unspecified: M10.9

## 2021-09-03 ENCOUNTER — Encounter (HOSPITAL_BASED_OUTPATIENT_CLINIC_OR_DEPARTMENT_OTHER): Payer: Self-pay | Admitting: Surgery

## 2021-09-05 ENCOUNTER — Other Ambulatory Visit: Payer: Self-pay

## 2021-09-05 ENCOUNTER — Ambulatory Visit (HOSPITAL_BASED_OUTPATIENT_CLINIC_OR_DEPARTMENT_OTHER)
Admission: RE | Admit: 2021-09-05 | Discharge: 2021-09-05 | Disposition: A | Discharge status: Home / Self Care | Payer: Medicare Other | Attending: Surgery | Admitting: Surgery

## 2021-09-05 ENCOUNTER — Encounter (HOSPITAL_BASED_OUTPATIENT_CLINIC_OR_DEPARTMENT_OTHER)
Admission: RE | Disposition: A | Discharge status: Home / Self Care | Payer: Self-pay | Source: Home / Self Care | Attending: Surgery

## 2021-09-05 ENCOUNTER — Encounter (HOSPITAL_BASED_OUTPATIENT_CLINIC_OR_DEPARTMENT_OTHER): Payer: Self-pay | Admitting: Surgery

## 2021-09-05 ENCOUNTER — Ambulatory Visit (HOSPITAL_BASED_OUTPATIENT_CLINIC_OR_DEPARTMENT_OTHER): Payer: Medicare Other | Admitting: Anesthesiology

## 2021-09-05 ENCOUNTER — Ambulatory Visit: Payer: Self-pay | Admitting: Surgery

## 2021-09-05 DIAGNOSIS — Z86718 Personal history of other venous thrombosis and embolism: Secondary | ICD-10-CM | POA: Insufficient documentation

## 2021-09-05 DIAGNOSIS — M795 Residual foreign body in soft tissue: Secondary | ICD-10-CM | POA: Diagnosis not present

## 2021-09-05 DIAGNOSIS — I2510 Atherosclerotic heart disease of native coronary artery without angina pectoris: Secondary | ICD-10-CM | POA: Insufficient documentation

## 2021-09-05 DIAGNOSIS — D631 Anemia in chronic kidney disease: Secondary | ICD-10-CM | POA: Diagnosis not present

## 2021-09-05 DIAGNOSIS — N1832 Chronic kidney disease, stage 3b: Secondary | ICD-10-CM | POA: Insufficient documentation

## 2021-09-05 DIAGNOSIS — Y813 Surgical instruments, materials and general- and plastic-surgery devices (including sutures) associated with adverse incidents: Secondary | ICD-10-CM | POA: Diagnosis not present

## 2021-09-05 DIAGNOSIS — S31109A Unspecified open wound of abdominal wall, unspecified quadrant without penetration into peritoneal cavity, initial encounter: Secondary | ICD-10-CM | POA: Insufficient documentation

## 2021-09-05 DIAGNOSIS — R0600 Dyspnea, unspecified: Secondary | ICD-10-CM | POA: Diagnosis not present

## 2021-09-05 DIAGNOSIS — T8143XA Infection following a procedure, organ and space surgical site, initial encounter: Secondary | ICD-10-CM | POA: Diagnosis not present

## 2021-09-05 DIAGNOSIS — D759 Disease of blood and blood-forming organs, unspecified: Secondary | ICD-10-CM | POA: Insufficient documentation

## 2021-09-05 DIAGNOSIS — I129 Hypertensive chronic kidney disease with stage 1 through stage 4 chronic kidney disease, or unspecified chronic kidney disease: Secondary | ICD-10-CM | POA: Insufficient documentation

## 2021-09-05 DIAGNOSIS — Y838 Other surgical procedures as the cause of abnormal reaction of the patient, or of later complication, without mention of misadventure at the time of the procedure: Secondary | ICD-10-CM | POA: Insufficient documentation

## 2021-09-05 DIAGNOSIS — T8189XA Other complications of procedures, not elsewhere classified, initial encounter: Secondary | ICD-10-CM | POA: Insufficient documentation

## 2021-09-05 DIAGNOSIS — F419 Anxiety disorder, unspecified: Secondary | ICD-10-CM | POA: Diagnosis not present

## 2021-09-05 DIAGNOSIS — D649 Anemia, unspecified: Secondary | ICD-10-CM | POA: Insufficient documentation

## 2021-09-05 DIAGNOSIS — T8579XA Infection and inflammatory reaction due to other internal prosthetic devices, implants and grafts, initial encounter: Secondary | ICD-10-CM | POA: Insufficient documentation

## 2021-09-05 DIAGNOSIS — N189 Chronic kidney disease, unspecified: Secondary | ICD-10-CM | POA: Diagnosis not present

## 2021-09-05 DIAGNOSIS — I4891 Unspecified atrial fibrillation: Secondary | ICD-10-CM | POA: Insufficient documentation

## 2021-09-05 DIAGNOSIS — E039 Hypothyroidism, unspecified: Secondary | ICD-10-CM | POA: Diagnosis not present

## 2021-09-05 DIAGNOSIS — K219 Gastro-esophageal reflux disease without esophagitis: Secondary | ICD-10-CM | POA: Insufficient documentation

## 2021-09-05 DIAGNOSIS — T8149XA Infection following a procedure, other surgical site, initial encounter: Secondary | ICD-10-CM | POA: Diagnosis not present

## 2021-09-05 HISTORY — DX: Atherosclerotic heart disease of native coronary artery without angina pectoris: I25.10

## 2021-09-05 HISTORY — DX: Postsurgical malabsorption, not elsewhere classified: K91.2

## 2021-09-05 HISTORY — DX: Hematuria, unspecified: R31.9

## 2021-09-05 HISTORY — DX: Unspecified hearing loss, unspecified ear: H91.90

## 2021-09-05 HISTORY — DX: Hypothyroidism, unspecified: E03.9

## 2021-09-05 HISTORY — DX: Other disorders resulting from impaired renal tubular function: N25.89

## 2021-09-05 HISTORY — DX: Short bowel syndrome, unspecified: K90.829

## 2021-09-05 HISTORY — DX: Presence of spectacles and contact lenses: Z97.3

## 2021-09-05 HISTORY — PX: WOUND DEBRIDEMENT: SHX247

## 2021-09-05 SURGERY — DEBRIDEMENT, WOUND
Anesthesia: Monitor Anesthesia Care | Site: Abdomen

## 2021-09-05 MED ORDER — PHENYLEPHRINE HCL (PRESSORS) 10 MG/ML IV SOLN
INTRAVENOUS | Status: DC | PRN
  Administered 2021-09-05: 80 ug via INTRAVENOUS

## 2021-09-05 MED ORDER — CEFAZOLIN SODIUM-DEXTROSE 2-4 GM/100ML-% IV SOLN
2.0000 g | INTRAVENOUS | Status: AC
  Administered 2021-09-05: 2 g via INTRAVENOUS

## 2021-09-05 MED ORDER — ACETAMINOPHEN 500 MG PO TABS
1000.0000 mg | ORAL_TABLET | ORAL | Status: AC
  Administered 2021-09-05: 1000 mg via ORAL

## 2021-09-05 MED ORDER — PROPOFOL 10 MG/ML IV BOLUS
INTRAVENOUS | Status: DC | PRN
  Administered 2021-09-05: 30 mg via INTRAVENOUS

## 2021-09-05 MED ORDER — OXYCODONE HCL 5 MG/5ML PO SOLN: 5.0000 mg | Freq: Once | ORAL | Status: DC | PRN

## 2021-09-05 MED ORDER — FENTANYL CITRATE (PF) 100 MCG/2ML IJ SOLN: 25.0000 ug | INTRAMUSCULAR | Status: DC | PRN

## 2021-09-05 MED ORDER — LACTATED RINGERS IV SOLN: INTRAVENOUS | Status: DC

## 2021-09-05 MED ORDER — CEFAZOLIN SODIUM-DEXTROSE 2-4 GM/100ML-% IV SOLN
INTRAVENOUS | Status: AC
  Filled 2021-09-05: qty 100

## 2021-09-05 MED ORDER — FENTANYL CITRATE (PF) 100 MCG/2ML IJ SOLN
INTRAMUSCULAR | Status: AC
  Filled 2021-09-05: qty 2

## 2021-09-05 MED ORDER — DEXMEDETOMIDINE (PRECEDEX) IN NS 20 MCG/5ML (4 MCG/ML) IV SYRINGE
PREFILLED_SYRINGE | INTRAVENOUS | Status: AC
  Filled 2021-09-05: qty 10

## 2021-09-05 MED ORDER — OXYCODONE HCL 5 MG PO TABS: 5.0000 mg | ORAL_TABLET | Freq: Once | ORAL | Status: DC | PRN

## 2021-09-05 MED ORDER — DIPHENHYDRAMINE HCL 50 MG/ML IJ SOLN
INTRAMUSCULAR | Status: DC | PRN
  Administered 2021-09-05: 12.5 mg via INTRAVENOUS

## 2021-09-05 MED ORDER — 0.9 % SODIUM CHLORIDE (POUR BTL) OPTIME
TOPICAL | Status: DC | PRN
  Administered 2021-09-05: 500 mL

## 2021-09-05 MED ORDER — PROPOFOL 500 MG/50ML IV EMUL
INTRAVENOUS | Status: AC
  Filled 2021-09-05: qty 50

## 2021-09-05 MED ORDER — MIDAZOLAM HCL 2 MG/2ML IJ SOLN
INTRAMUSCULAR | Status: AC
  Filled 2021-09-05: qty 2

## 2021-09-05 MED ORDER — KETAMINE HCL 50 MG/5ML IJ SOSY
PREFILLED_SYRINGE | INTRAMUSCULAR | Status: AC
  Filled 2021-09-05: qty 5

## 2021-09-05 MED ORDER — LIDOCAINE HCL (CARDIAC) PF 100 MG/5ML IV SOSY
PREFILLED_SYRINGE | INTRAVENOUS | Status: DC | PRN
  Administered 2021-09-05: 60 mg via INTRAVENOUS

## 2021-09-05 MED ORDER — CHLORHEXIDINE GLUCONATE CLOTH 2 % EX PADS: 6.0000 | MEDICATED_PAD | Freq: Once | CUTANEOUS | Status: DC

## 2021-09-05 MED ORDER — PROPOFOL 500 MG/50ML IV EMUL
INTRAVENOUS | Status: AC
  Filled 2021-09-05: qty 150

## 2021-09-05 MED ORDER — DIPHENHYDRAMINE HCL 50 MG/ML IJ SOLN
INTRAMUSCULAR | Status: AC
  Filled 2021-09-05: qty 1

## 2021-09-05 MED ORDER — PROPOFOL 500 MG/50ML IV EMUL
INTRAVENOUS | Status: DC | PRN
Start: 2021-09-05 — End: 2021-09-05
  Administered 2021-09-05: 75 ug/kg/min via INTRAVENOUS

## 2021-09-05 MED ORDER — ACETAMINOPHEN 500 MG PO TABS
ORAL_TABLET | ORAL | Status: AC
  Filled 2021-09-05: qty 2

## 2021-09-05 MED ORDER — KETAMINE HCL 10 MG/ML IJ SOLN
INTRAMUSCULAR | Status: DC | PRN
  Administered 2021-09-05 (×2): 10 mg via INTRAVENOUS

## 2021-09-05 MED ORDER — ONDANSETRON HCL 4 MG/2ML IJ SOLN
INTRAMUSCULAR | Status: DC | PRN
  Administered 2021-09-05: 4 mg via INTRAVENOUS

## 2021-09-05 MED ORDER — FENTANYL CITRATE (PF) 100 MCG/2ML IJ SOLN
INTRAMUSCULAR | Status: DC | PRN
  Administered 2021-09-05: 50 ug via INTRAVENOUS

## 2021-09-05 MED ORDER — BUPIVACAINE-EPINEPHRINE 0.25% -1:200000 IJ SOLN
INTRAMUSCULAR | Status: DC | PRN
  Administered 2021-09-05: 20 mL

## 2021-09-05 SURGICAL SUPPLY — 50 items
ADH SKN CLS APL DERMABOND .7 (GAUZE/BANDAGES/DRESSINGS) ×1
APL PRP STRL LF DISP 70% ISPRP (MISCELLANEOUS) ×1
BLADE SURG 15 STRL LF DISP TIS (BLADE) ×1 IMPLANT
BLADE SURG 15 STRL SS (BLADE) ×2
CHLORAPREP W/TINT 26 (MISCELLANEOUS) ×1 IMPLANT
COVER BACK TABLE 60X90IN (DRAPES) ×2 IMPLANT
COVER MAYO STAND STRL (DRAPES) ×2 IMPLANT
DERMABOND ADVANCED (GAUZE/BANDAGES/DRESSINGS) ×1
DERMABOND ADVANCED .7 DNX12 (GAUZE/BANDAGES/DRESSINGS) IMPLANT
DRAIN PENROSE 0.25X18 (DRAIN) ×1 IMPLANT
DRAPE LAPAROTOMY 100X72 PEDS (DRAPES) ×1 IMPLANT
DRAPE UTILITY XL STRL (DRAPES) ×2 IMPLANT
ELECT REM PT RETURN 9FT ADLT (ELECTROSURGICAL) ×2
ELECTRODE REM PT RTRN 9FT ADLT (ELECTROSURGICAL) ×1 IMPLANT
GAUZE 4X4 16PLY ~~LOC~~+RFID DBL (SPONGE) ×2 IMPLANT
GAUZE SPONGE 4X4 12PLY STRL (GAUZE/BANDAGES/DRESSINGS) ×2 IMPLANT
GLOVE SRG 8 PF TXTR STRL LF DI (GLOVE) ×1 IMPLANT
GLOVE SURG PR MICRO ENCORE 7.5 (GLOVE) ×2 IMPLANT
GLOVE SURG UNDER POLY LF SZ6.5 (GLOVE) ×2 IMPLANT
GLOVE SURG UNDER POLY LF SZ7.5 (GLOVE) ×1 IMPLANT
GLOVE SURG UNDER POLY LF SZ8 (GLOVE) ×2
GOWN STRL REUS W/TWL LRG LVL3 (GOWN DISPOSABLE) ×3 IMPLANT
KIT TURNOVER CYSTO (KITS) ×2 IMPLANT
NDL HYPO 25X1 1.5 SAFETY (NEEDLE) IMPLANT
NEEDLE HYPO 22GX1.5 SAFETY (NEEDLE) ×1 IMPLANT
NEEDLE HYPO 25X1 1.5 SAFETY (NEEDLE) IMPLANT
NS IRRIG 500ML POUR BTL (IV SOLUTION) ×1 IMPLANT
PACK BASIN DAY SURGERY FS (CUSTOM PROCEDURE TRAY) ×2 IMPLANT
PENCIL SMOKE EVACUATOR (MISCELLANEOUS) ×2 IMPLANT
SPONGE T-LAP 18X18 ~~LOC~~+RFID (SPONGE) ×1 IMPLANT
SPONGE T-LAP 4X18 ~~LOC~~+RFID (SPONGE) IMPLANT
SUCTION FRAZIER HANDLE 10FR (MISCELLANEOUS)
SUCTION TUBE FRAZIER 10FR DISP (MISCELLANEOUS) IMPLANT
SUT CHROMIC 3 0 SH 27 (SUTURE) IMPLANT
SUT ETHILON 2 0 FS 18 (SUTURE) IMPLANT
SUT MNCRL AB 4-0 PS2 18 (SUTURE) ×2 IMPLANT
SUT SILK 2 0 PERMA HAND 18 BK (SUTURE) ×1 IMPLANT
SUT VIC AB 0 CT1 27 (SUTURE) ×2
SUT VIC AB 0 CT1 27XBRD ANTBC (SUTURE) IMPLANT
SUT VIC AB 2-0 SH 27 (SUTURE)
SUT VIC AB 2-0 SH 27XBRD (SUTURE) IMPLANT
SUT VIC AB 3-0 SH 18 (SUTURE) ×2 IMPLANT
SUT VIC AB 3-0 SH 27 (SUTURE) ×2
SUT VIC AB 3-0 SH 27X BRD (SUTURE) ×1 IMPLANT
SYR BULB IRRIG 60ML STRL (SYRINGE) ×2 IMPLANT
SYR CONTROL 10ML LL (SYRINGE) ×2 IMPLANT
TAPE PAPER 3X10 WHT MICROPORE (GAUZE/BANDAGES/DRESSINGS) ×1 IMPLANT
TOWEL OR 17X26 10 PK STRL BLUE (TOWEL DISPOSABLE) ×2 IMPLANT
TUBE CONNECTING 12X1/4 (SUCTIONS) ×2 IMPLANT
YANKAUER SUCT BULB TIP NO VENT (SUCTIONS) ×2 IMPLANT

## 2021-09-05 NOTE — Anesthesia Postprocedure Evaluation (Signed)
Anesthesia Post Note  Patient: Derek [name]  Procedure(s) Performed: CHRONIC ABDOMINAL WOUND EXPLORATION, REMOVAL OF PROLENE STITCH ABCESS (Abdomen)     Patient location during evaluation: PACU Anesthesia Type: MAC Level of consciousness: awake and alert Pain management: pain level controlled Vital Signs Assessment: post-procedure vital signs reviewed and stable Respiratory status: spontaneous breathing, nonlabored ventilation, respiratory function stable and patient connected to nasal cannula oxygen Cardiovascular status: stable and blood pressure returned to baseline Postop Assessment: no apparent nausea or vomiting Anesthetic complications: no   No notable events documented.  Last Vitals:  Vitals:   09/05/21 1000 09/05/21 1015  BP: 129/68 (!) 141/93  Pulse: (!) 49 (!) 52  Resp: 18 19  Temp:  (!) 36.4 C  SpO2: 98% 99%    Last Pain:  Vitals:   09/05/21 1015  TempSrc:   PainSc: 0-No pain                 Belenda Cruise P Jabir Dahlem

## 2021-09-05 NOTE — Discharge Instructions (Addendum)
°  Post Anesthesia Home Care Instructions  Activity: Get plenty of rest for the remainder of the day. A responsible individual must stay with you for 24 hours following the procedure.  For the next 24 hours, DO NOT: -Drive a car -Paediatric nurse -Drink alcoholic beverages -Take any medication unless instructed by your physician -Make any legal decisions or sign important papers.  Meals: Start with liquid foods such as gelatin or soup. Progress to regular foods as tolerated. Avoid greasy, spicy, heavy foods. If nausea and/or vomiting occur, drink only clear liquids until the nausea and/or vomiting subsides. Call your physician if vomiting continues.  Special Instructions/Symptoms: Your throat may feel dry or sore from the anesthesia or the breathing tube placed in your throat during surgery. If this causes discomfort, gargle with warm salt water. The discomfort should disappear within 24 hours.       Keep wound clean and dry.  Cover drain with absorbant dressing much like you did your previous wound.  We will remove the drain in office in about 7-10 days.  The wound can get wet and soapy in the shower - just don't submerge it underwater like in a bath or pool.     May take tylenol again at 2:00pm if needed.

## 2021-09-05 NOTE — Op Note (Signed)
Patient: Derek [name] (1941-04-20, [id number])  Date of Surgery: 09/05/2021   Preoperative Diagnosis: CHRONIC ABDOMINAL WOUND   Postoperative Diagnosis: CHRONIC ABDOMINAL WOUND   Surgical Procedure: CHRONIC ABDOMINAL WOUND EXPLORATION, REMOVAL OF PROLENE STITCH ABCESS:    Operative Team Members:  Surgeon(s) and Role:    * Krisinda Giovanni, Nickola Major, MD - Primary   Anesthesiologist: Darral Dash, DO CRNA: Rogers Blocker, CRNA; Georgeanne Nim, CRNA   Anesthesia: Monitor Anesthesia Care   Fluids:  Total I/O In: 500 [I.V.:400; IV Piggyback:100] Out: 2 [LZJQB:3]  Complications: None  Drains:  Penrose drain in the subcutaneous space    Specimen:  ID Type Source Tests Collected by Time Destination  1 :  Tissue PATH Other SURGICAL PATHOLOGY Devery Odwyer, Nickola Major, MD 09/05/2021 [phone]      Disposition:  PACU - hemodynamically stable.  Plan of Care: Discharge to home after PACU    Indications for Procedure: [name] is a 81 y.o. male who presented with a chronic abdominal wound at the inferior aspect of an old midline laparotomy incision.  Mr. Gilmer wound continues to fail to heal completely. At this point I recommend we proceed with wound exploration. Hopefully there is some foreign body at the base of this wound causing this infection and I can remove and get the wound healed up. Unfortunately, this may represent a small fistula to the intestines. I explained my plan would be to make a small incision around the wound and follow its tract to the abdominal wall musculature. Hopefully I am able to identify a foreign body and remove it. With his history, however, I do not plan to enter the abdomen or attempt to treat a enterocutaneous fistula during this operation. Instead I would abort and close an attempt to allow the wound to drain and heal back to its current state. He voiced understanding and agreement with this plan.  The procedure itself as well as its  risks, benefits and alternatives were discussed.  The risks discussed included but were not limited to the risk of infection, bleeding, damage to nearby structures.  After a full discussion and all questions answered the patient granted consent to proceed.  Findings: Prolene stitch removed, surrounded by inflammation, likely the source of this wound.   Description of Procedure:   On the date stated above the patient was taken operating room suite and placed in supine position.  Monitored anesthesia care was induced.  A timeout was completed verifying correct patient, procedure, positioning, and equipment needed for the case.  The patient's abdomen was prepped and draped in usual sterile fashion.  I made a small elliptical incision around the chronic wound to the inferior aspect of the midline laparotomy incision.  I probed the wound with a hemostat to find the tract of the wound down to the abdominal wall.  The wound reached the level of the abdominal fascia and I had not yet found any foreign body.  I cleared the fascia around the area of the wound.  I identified a Prolene suture at the fascial level.  This was retracted and carefully dissected out of the fascia and removed from the field.  Please see photo below.  Hopefully this was a foreign body causing the chronic wound.  I direct my attention to closure.  The fascia was reapproximated using 0 Vicryl suture.  1/4 inch Penrose drain was left in the wound.  The skin was reapproximated with Vicryl suture in the deep dermal layer and  Monocryl above the Penrose drain which exits the inferior aspect of the incision.  Dermabond was applied and a dressing was applied over the drain.  All sponge needle counts were correct at the end of this case.  At the end of the case we reviewed the infection status of the case. Patient: Private Patient Elective Case Case: Elective Infection Present At Time Of Surgery (PATOS):  infected prolene stitch and wound  Louanna Raw, MD General, Bariatric, & Minimally Invasive Surgery [facility], [place]

## 2021-09-05 NOTE — H&P (Signed)
Admitting Physician: Nickola Major Teion Ballin  Service: General surgery  CC: chronic abdominal wound  Subjective   HPI: Derek [name] is an 81 y.o. male who is here for abdominal wound exploration  Past Medical History:  Diagnosis Date   Anemia    Anxiety    Aortic insufficiency    a. mild-mod by echo 09/2015.   Arthritis    "knees; left shoulder" (09/21/2013) oa   Ascending aortic aneurysm    stable less than 5 cm per 11-21-2020 chest ct epic   Atrial fibrillation ([place])    B12 deficiency    takes Vit 12 shot every 14days    Bowel perforation ([place]) 08/27/2016   CKD (chronic kidney disease) stage 3 B, GFR 30-59 ml/min ([place]) 08/22/2011   dr patel Cassell Clement 08-26-2021   Colon cancer [facility]) 2018   surgery   Colonic ischemia ([place]) 08/27/2016   Coronary artery disease    COVID-19 03/29/2021   hospitalized from 03-29-2021 to 03-31-2021   Crohn's disease ([place])    Enteric hyperoxaluria 02/21/2016   GERD (gastroesophageal reflux disease)    Gout    takes Uloric  daily   Gout flare 09/02/2021   right heel decadron 10 mg im given   Hearing loss    Heart murmur    Hematuria    few weeks ago per pt wife pam on 09-03-2021   Hepatitis C 1978   negtive RNA load - spontaneously cleared   Hiatal hernia    High output ileostomy ([place]) 06/02/2017   History of blood transfusion 1978; 1990's; ?   "w/bowel resection; S/P allupurinol; ?" (09/21/2013)   History of colon polyps    History of dvt 2006   late 1980's earlt 1990's from jumping from plane left leg   History of kidney stones    1980's   History of MRSA infection 2010   History of small bowel obstruction    History of staph infection 1978   Hyperoxaluria    Intestinal   Hypertension    Hypothyroidism    Insomnia    takes Trazodone nightly   Internal hemorrhoids    LV dysfunction    a. h/o EF 45-50% in 2015, normalized on subsequent echoes.   Nephrolithiasis    Nocardia infection 09/2016   resolved   Pancreatitis 2010    elevated lipase and amylase, stranding in tail of pancreas, ? from Humira   Pancytopenia    Hx of   Peripheral neuropathy    takes Gabapentin daily in both legs   Pneumonia    several times last times 2012   PTSD (post-traumatic stress disorder)    takes paxil for   Pulmonary nodule    a. 75m by CT 05/2015, recommended f/u 6-12 months.   Renal tubular acidosis    RLS (restless legs syndrome)    Rosacea conjunctivitis(372.31)    [place]par[place]roidism [place] 02/21/2016   Shingles 2022   head close to eye   Short bowel syndrome    Sinus bradycardia 2018   none since   Skin cancer    "cut/burned off left ear and face" (09/21/2013)   Small bowel obstruction ([place] 1978   Status post reversal of ileostomy 07/23/2017   [place]rombocytopenia ([place]    problems since 1978   Wears glasses reading     Past Surgical History:  Procedure Laterality Date   [place]GERY Right    [place]   CARDIOVERSION N/A 05/29/2016  Procedure: CARDIOVERSION;  Surgeon: Sanda Klein, MD;  Location: [facility];  Service: Cardiovascular;  Laterality: N/A;   CHOLECYSTECTOMY     yrs ago   Colon Cancer     COLON RESECTION N/A 08/14/2016   Procedure: LAPAROSCOPIC RESECTION TRANSVERSE COLON;  Surgeon: [name], MD;  Location: [facility];  Service: General;  Laterality: N/A;   COLON SURGERY  2018   ileostomy   COLONOSCOPY     ESOPHAGOGASTRODUODENOSCOPY     EXTRACORPOREAL SHOCK WAVE LITHOTRIPSY     yrs ago   EYE SURGERY     cataract surgery bilateral   FOOT SURGERY Right    "took gout out"   HEMICOLECTOMY Right    ILEOCECETOMY  1978   [name] 05/10/2000  (09/21/2013)   ILEOSTOMY     ILEOSTOMY CLOSURE N/A 07/23/2017   Procedure: ILEOSTOMY REVERSAL ;  Surgeon: [name], MD;  Location: [facility];  Service: General;  Laterality: N/A;   INGUINAL HERNIA REPAIR Right    IR FLUORO GUIDE CV LINE RIGHT  05/07/2017   IR REMOVAL TUN CV CATH W/O FL  10/08/2017   IR US  GUIDE VASC ACCESS RIGHT  05/07/2017   KNEE ARTHROSCOPY Left    LAPAROTOMY N/A 08/27/2016   Procedure: EXPLORATORYLAPAROTOMY, LYSIS OF ADHESIONS, ILEOSTOMY, RIGHT COLECTOMY;  Surgeon: [name], MD;  Location: [facility];  Service: General;  Laterality: N/A;   LIGAMENT REPAIR Left    POLYPECTOMY     right big toe surgery  06/2021   in [place]   TEE WITHOUT CARDIOVERSION N/A 05/29/2016   Procedure: TRANSESOPHAGEAL ECHOCARDIOGRAM (TEE);  Surgeon: Sanda Klein, MD;  Location: [place];  Service: Cardiovascular;  Laterality: N/A;   TOTAL SHOULDER ARTHROPLASTY Left 09/21/2013   Procedure: LEFT TOTAL SHOULDER ARTHROPLASTY;  Surgeon: Marin Shutter, MD;  Location: [place];  Service: Orthopedics;  Laterality: Left;    Family History  Problem Relation Age of Onset   Kidney disease Father    Hypertension Father    Aneurysm Mother    Aneurysm Sister    Esophageal cancer Neg Hx    Stomach cancer Neg Hx    Rectal cancer Neg Hx    Colon cancer Neg Hx     Social:  reports that he quit smoking about 46 years ago. His smoking use included cigarettes. He has a 40.00 pack-year smoking history. He quit smokeless tobacco use about 43 years ago.  His smokeless tobacco use included chew. He reports that he does not drink alcohol and does not use drugs.  Allergies:  Allergies  Allergen Reactions   Lorazepam Other (See Comments)    Reaction:  Hallucinations    Humira [Adalimumab] Other (See Comments)    Pt states that he got pancreatitis.     Colchicine     cramps   Other     Cannot have ct scans with dye due to kidney function   Quinolones     Patient was warned about not using Cipro and similar antibiotics. Recent studies have raised concern that fluoroquinolone antibiotics could be associated with an increased risk of aortic aneurysm Fluoroquinolones have non-antimicrobial properties that might jeopardise the integrity of the extracellular matrix of the vascular wall In a  propensity score  matched cohort study in [place], there was a 66% increased rate of aortic aneurysm or dissection associated with oral fluoroquinolone use, compared wit    Medications: Current Outpatient Medications  Medication Instructions   Acidophilus Lactobacillus CAPS 1 capsule, Oral, Daily   amiodarone (PACERONE) 200  mg, Oral, Daily   calcium carbonate (TUMS - DOSED IN MG ELEMENTAL CALCIUM) 500 MG chewable tablet 2 tablets, Oral, 2 times daily   cyanocobalamin ((VITAMIN B-12)) 1,000 mcg, Intramuscular, THREE TIMES A MONTH   dexamethasone (DECADRON) 10 mg, Intravenous,  Once, For right  heel gout   Eliquis 5 mg, Oral, 2 times daily   famotidine (PEPCID) 40 mg, Oral, Daily   febuxostat (ULORIC) 80 mg, Oral, Daily   fluticasone (CUTIVATE) 0.05 % cream Topical, As needed   gabapentin (NEURONTIN) 500 mg, Oral, Daily at bedtime   HYDROcodone-acetaminophen (NORCO) 10-325 MG tablet 1-2 tablets, Oral, 4 times daily PRN   levothyroxine (SYNTHROID) 50 mcg, Oral, Daily before breakfast   lidocaine (LIDODERM) 5 % 1 patch, Transdermal, Every 24 hours, Remove & Discard patch within 12 hours or as directed by MD   magnesium oxide (MAG-OX) 400 (241.3 Mg) MG tablet Oral, 2 times daily, 420 mg 3 in am and 3 in pm   Multiple Vitamin (MULTIVITAMIN WITH MINERALS) TABS tablet 1 tablet, Oral, Daily   ondansetron (ZOFRAN) 4 mg, Oral, Every 8 hours PRN   PARoxetine (PAXIL) 20 mg, Daily   rosuvastatin (CRESTOR) 10 mg, Oral, Daily   sodium bicarbonate 650 MG tablet Oral, 2 times daily, 3 tabs am and 3 tabs in pm   sucralfate (CARAFATE) 1 g, Oral, At bedtime PRN   traZODone (DESYREL) 150 MG tablet 1 tablet, Oral, Daily at bedtime   Vitamin D 2,000 Units, Oral, Daily    ROS - all of the below systems have been reviewed with the patient and positives are indicated with bold text General: chills, fever or night sweats Eyes: blurry vision or double vision ENT: epistaxis or sore throat Allergy/Immunology: itchy/watery eyes or  nasal congestion Hematologic/Lymphatic: bleeding problems, blood clots or swollen lymph nodes Endocrine: temperature intolerance or unexpected weight changes Breast: new or changing breast lumps or nipple discharge Resp: cough, shortness of breath, or wheezing CV: chest pain or dyspnea on exertion GI: as per HPI GU: dysuria, trouble voiding, or hematuria MSK: joint pain or joint stiffness Neuro: TIA or stroke symptoms Derm: pruritus and skin lesion changes Psych: anxiety and depression  Objective   PE Blood pressure (!) 149/64, pulse 67, temperature (!) 97.4 F (36.3 C), temperature source Oral, resp. rate 17, height 6' (1.829 m), weight 96.5 kg, SpO2 100 %. General appearance - Consistent with stated age. Normal posture. Voice Normal. Mental status - alert and oriented Integumentary - No rash or lesion on limited exam Head - Normocephalic, atraumatic Face - Strength and tone intact Eyes - extraocular movement intact, sclera anicteric Chest - quiet, even and easy respiratory effort with no use of accessory muscles Neurological - able to articulate well with normal speech/language, rate, volume and coherence. Mood/affect - normal Judgement and insight - insight is appropriate concerning matters relevant to self and the patient displays appropriate judgment regarding every day activities. Thought Processes/Cognitive Function - aware of current events. Musculoskeletal - strength symmetrical throughout, no deformity Abdomen -1 cm circular area of heaped granulation tissue at the inferior aspect of his midline laparotomy incision which has not healed up much since from his previous visit.   No results found for this or any previous visit (from the past 24 hour(s)).  Imaging Orders     CT Abd/Pel 03/27/21 Reviewed prior to surgery 1. Status post right colectomy without evidence of complication at the anastomotic site. 2. Multiple mildly fluid distended loops of small and large  bowel throughout  the abdomen without evidence of mechanical obstruction. This may reflect diarrhea. 3. Some loops of small bowel are closely apposed to the anterior abdominal wall in a similar configuration to the prior study, raising suspicion for adhesion/tethering. 4. Focal area of soft tissue thickening in the region of the umbilicus is unchanged compared to the prior study and likely reflects scar. 5. Nonobstructing right renal calculi. 6. Splenomegaly. 7.  Aortic Atherosclerosis (ICD10-I70.0).   Assessment and Plan    Chronic abdominal wound infection, subsequent encounter   Mr. Holeman wound continues to fail to heal completely. At this point I recommend we proceed with wound exploration. Hopefully there is some foreign body at the base of this wound causing this infection and I can remove and get the wound healed up. Unfortunately this may represent a small fistula to the intestines. I explained my plan would be to make a small incision around the wound and follow its tract to the abdominal wall musculature. Hopefully I am able to identify a foreign body and remove it. With his history, however, I do not plan to enter the abdomen or attempt to treat a enterocutaneous fistula during this operation. Instead I would abort and close an attempt to allow the wound to drain and heal back to its current state. He voiced understanding and agreement with this plan.    Felicie Morn, MD  [facility] Use [email or web address] to contact on call provider

## 2021-09-05 NOTE — Transfer of Care (Signed)
Immediate Anesthesia Transfer of Care Note  Patient: Derek [name]  Procedure(s) Performed: CHRONIC ABDOMINAL WOUND EXPLORATION, REMOVAL OF PROLENE STITCH ABCESS (Abdomen)  Patient Location: PACU  Anesthesia Type:MAC  Level of Consciousness: awake, alert , oriented and patient cooperative  Airway & Oxygen Therapy: Patient Spontanous Breathing  Post-op Assessment: Report given to RN and Post -op Vital signs reviewed and stable  Post vital signs: Reviewed and stable  Last Vitals:  Vitals Value Taken Time  BP 137/60 09/05/21 0928  Temp    Pulse 51 09/05/21 0931  Resp 25 09/05/21 0931  SpO2 98 % 09/05/21 0931  Vitals shown include unvalidated device data.  Last Pain:  Vitals:   09/05/21 0747  TempSrc: Oral  PainSc: 0-No pain      Patients Stated Pain Goal: 5 (78/29/56 2130)  Complications: No notable events documented.

## 2021-09-05 NOTE — Anesthesia Preprocedure Evaluation (Addendum)
Anesthesia Evaluation  Patient identified by MRN, date of birth, ID band Patient awake    Reviewed: Allergy & Precautions, NPO status , Patient's Chart, lab work & pertinent test results  Airway Mallampati: II  TM Distance: >3 FB Neck ROM: Full    Dental no notable dental hx.    Pulmonary neg pulmonary ROS, former smoker,    Pulmonary exam normal        Cardiovascular hypertension, + CAD, + DOE and + DVT  + dysrhythmias Atrial Fibrillation  Rhythm:Irregular Rate:Normal     Neuro/Psych Anxiety negative neurological ROS     GI/Hepatic hiatal hernia, GERD  ,Chronic abdominal wound 2/2 multiple surgeries    Endo/Other  Hypothyroidism   Renal/GU CRFRenal disease  negative genitourinary   Musculoskeletal  (+) Arthritis , Osteoarthritis,    Abdominal Normal abdominal exam  (+)   Peds  Hematology  (+) Blood dyscrasia, anemia ,   Anesthesia Other Findings   Reproductive/Obstetrics                            Anesthesia Physical Anesthesia Plan  ASA: 3  Anesthesia Plan: MAC   Post-op Pain Management:    Induction: Intravenous  PONV Risk Score and Plan: 1 and Ondansetron, Dexamethasone and Treatment may vary due to age or medical condition  Airway Management Planned: Simple Face Mask, Natural Airway and Nasal Cannula  Additional Equipment: None  Intra-op Plan:   Post-operative Plan:   Informed Consent: I have reviewed the patients History and Physical, chart, labs and discussed the procedure including the risks, benefits and alternatives for the proposed anesthesia with the patient or authorized representative who has indicated his/her understanding and acceptance.     Dental advisory given  Plan Discussed with: CRNA  Anesthesia Plan Comments: (Stress 03/22: ? The left ventricular ejection fraction is mildly decreased (45-54%). ? Nuclear stress EF: 52%. ? There was no ST segment  deviation noted during stress. ? There is a small defect of mild severity present in the apex location. The defect is non-reversible. In the setting of normal LVF, this is consistent with attenuation artifact. No ischemia noted. ? This is a low risk study. )        Anesthesia Quick Evaluation

## 2021-09-08 ENCOUNTER — Encounter (HOSPITAL_BASED_OUTPATIENT_CLINIC_OR_DEPARTMENT_OTHER): Payer: Self-pay | Admitting: Surgery

## 2021-09-30 DIAGNOSIS — Z4889 Encounter for other specified surgical aftercare: Secondary | ICD-10-CM | POA: Diagnosis not present

## 2021-10-01 DIAGNOSIS — E039 Hypothyroidism, unspecified: Secondary | ICD-10-CM | POA: Diagnosis not present

## 2021-10-01 DIAGNOSIS — Z20822 Contact with and (suspected) exposure to covid-19: Secondary | ICD-10-CM | POA: Diagnosis not present

## 2021-10-01 DIAGNOSIS — M545 Low back pain, unspecified: Secondary | ICD-10-CM | POA: Diagnosis not present

## 2021-10-01 DIAGNOSIS — E663 Overweight: Secondary | ICD-10-CM | POA: Diagnosis not present

## 2021-10-01 DIAGNOSIS — Z6828 Body mass index (BMI) 28.0-28.9, adult: Secondary | ICD-10-CM | POA: Diagnosis not present

## 2021-10-06 ENCOUNTER — Other Ambulatory Visit: Payer: Self-pay | Admitting: Thoracic Surgery (Cardiothoracic Vascular Surgery)

## 2021-10-06 DIAGNOSIS — I712 Thoracic aortic aneurysm, without rupture, unspecified: Secondary | ICD-10-CM

## 2021-10-29 ENCOUNTER — Ambulatory Visit (INDEPENDENT_AMBULATORY_CARE_PROVIDER_SITE_OTHER): Payer: Medicare Other | Admitting: Internal Medicine

## 2021-10-29 ENCOUNTER — Encounter: Payer: Self-pay | Admitting: Internal Medicine

## 2021-10-29 ENCOUNTER — Other Ambulatory Visit: Payer: Self-pay

## 2021-10-29 DIAGNOSIS — L988 Other specified disorders of the skin and subcutaneous tissue: Secondary | ICD-10-CM | POA: Diagnosis not present

## 2021-10-29 MED ORDER — AMOXICILLIN-POT CLAVULANATE 500-125 MG PO TABS: 1.0000 | ORAL_TABLET | Freq: Two times a day (BID) | ORAL | 0 refills | Status: DC

## 2021-10-29 NOTE — Progress Notes (Signed)
?  ? ? ? ? ?[facility] ? ?Reason for Consult: Chronic draining sinus of the lower abdominal wall ?Referring Provider: Dr. Thermon Leyland ? ?Assessment: ?Derek [name] has developed a chronic draining abdominal wall sinus.  This has failed resolved after removal of a piece of Prolene suture.  Certainly concerned about a deeper, smoldering process.  If he has had mesh repair he could have smoldering abdominal wall infection.  However, with his last colon biopsy showing active colitis I am also concerned about the possibility of an enterocutaneous fistula.  I will treat him with a slightly longer course of amoxicillin/clavulanate.  I have encouraged him to arrange a follow-up visit with Dr. Carlean Purl.  He will follow-up here in 4 weeks. ? ?Plan: ?Amoxicillin/clavulanate 500 mg twice daily for 3 weeks ?Follow-up with Dr. Carlean Purl ?Follow-up here in 4 weeks ? ?Patient Active Problem List  ? Diagnosis Date Noted  ? Draining cutaneous sinus tract 10/29/2021  ?  Priority: High  ? Personal history of colon cancer 08/14/2019  ?  Priority: Medium   ? S/P colon resection 08/14/2016  ?  Priority: Medium   ? Crohn's ileocolitis ([place]) 06/26/2009  ?  Priority: Medium   ? History of small bowel obstruction 05/09/2008  ?  Priority: Medium   ? Preop cardiovascular exam 04/21/2021  ? COVID-19 virus infection 03/29/2021  ? Hypocalcemia 03/29/2021  ? Atrial fibrillation, chronic ([place]) 03/29/2021  ? Hyperlipidemia 03/29/2021  ? Hypothyroidism 03/29/2021  ? Elevated LFTs 03/12/2019  ? Osteoarthritis of left knee 08/23/2018  ? History of gout 08/23/2018  ? Thoracic aortic aneurysm without rupture 10/26/2017  ? Aortic valve regurgitation 11/05/2016  ? Chronic anticoagulation 08/18/2016  ? Sinus bradycardia 08/17/2016  ? Adenomatous polyp of transverse colon s/p partial colectomy 08/14/2016 08/14/2016  ? Essential hypertension 05/27/2016  ? Peripheral neuropathic pain 05/26/2016  ? GERD (gastroesophageal reflux disease)  05/26/2016  ? Paroxysmal atrial fibrillation ([place]) 05/26/2016  ? Lung nodule 05/12/2016  ? Enteric hyperoxaluria 02/21/2016  ? Secondary hyperparathyroidism ([place]) 02/21/2016  ? Pain in joint, shoulder region 10/12/2013  ? Decreased range of motion of left shoulder 10/12/2013  ? Muscle weakness (generalized) 10/12/2013  ? Restriction of joint motion 10/12/2013  ? S/P shoulder replacement 09/21/2013  ? Nonischemic cardiomyopathy ([place]) 09/07/2013  ? Anxiety 08/22/2011  ? CKD (chronic kidney disease) stage 3, GFR 30-59 ml/min (HCC) 08/22/2011  ? Thrombocytopenia ([place]) 08/22/2011  ? CORONARY ATHEROSCLEROSIS NATIVE CORONARY ARTERY 06/25/2010  ? B12 DEFICIENCY 05/09/2008  ? Gout 05/09/2008  ? POST TRAUMATIC STRESS SYNDROME 05/09/2008  ? GERD 05/09/2008  ? HEPATITIS C Ab positive RNA neg, HX OF 05/09/2008  ? PULMONARY EMBOLISM, HX OF 05/09/2008  ? ? ?Patient's Medications  ?New Prescriptions  ? AMOXICILLIN-CLAVULANATE (AUGMENTIN) 500-125 MG TABLET    Take 1 tablet (500 mg total) by mouth 2 (two) times daily.  ?Previous Medications  ? ACIDOPHILUS LACTOBACILLUS CAPS    Take 1 capsule by mouth daily.  ? AMIODARONE (PACERONE) 200 MG TABLET    Take 1 tablet (200 mg total) by mouth daily.  ? CALCIUM CARBONATE (TUMS - DOSED IN MG ELEMENTAL CALCIUM) 500 MG CHEWABLE TABLET    Chew 2 tablets by mouth 2 (two) times daily.  ? CHOLECALCIFEROL (VITAMIN D) 2000 UNITS CAPS    Take 2,000 Units by mouth daily.  ? CYANOCOBALAMIN (,VITAMIN B-12,) 1000 MCG/ML INJECTION    Inject 1,000 mcg into the muscle. THREE TIMES A MONTH  ? DEXAMETHASONE (DECADRON) 10 MG/ML INJECTION    Inject  10 mg into the vein once. For right  heel gout  ? ELIQUIS 5 MG TABS TABLET    Take 1 tablet (5 mg total) by mouth 2 (two) times daily.  ? FAMOTIDINE (PEPCID) 20 MG TABLET    Take 40 mg by mouth daily.  ? FEBUXOSTAT (ULORIC) 40 MG TABLET    Take 80 mg by mouth daily.  ? FLUTICASONE (CUTIVATE) 0.05 % CREAM    Apply topically as needed.  ? GABAPENTIN (NEURONTIN) 100 MG  CAPSULE    Take 500 mg by mouth at bedtime.  ? HYDROCODONE-ACETAMINOPHEN (NORCO) 10-325 MG TABLET    Take 1-2 tablets by mouth 4 (four) times daily as needed.  ? LEVOTHYROXINE (SYNTHROID) 50 MCG TABLET    Take 50 mcg by mouth daily before breakfast.  ? LIDOCAINE (LIDODERM) 5 %    Place 1 patch onto the skin daily. Remove & Discard patch within 12 hours or as directed by MD  ? MAGNESIUM OXIDE (MAG-OX) 400 (241.3 MG) MG TABLET    Take by mouth 2 (two) times daily. 420 mg 3 in am and 3 in pm  ? MULTIPLE VITAMIN (MULTIVITAMIN WITH MINERALS) TABS TABLET    Take 1 tablet by mouth daily.  ? ONDANSETRON (ZOFRAN) 4 MG TABLET    Take 4 mg by mouth every 8 (eight) hours as needed for nausea or vomiting.  ? PAROXETINE (PAXIL) 40 MG TABLET    20 mg daily.  ? ROSUVASTATIN (CRESTOR) 10 MG TABLET    Take 1 tablet (10 mg total) by mouth daily.  ? SODIUM BICARBONATE 650 MG TABLET    Take by mouth 2 (two) times daily. 3 tabs am and 3 tabs in pm  ? SUCRALFATE (CARAFATE) 1 GM/10ML SUSPENSION    Take 10 mLs (1 g total) by mouth at bedtime as needed.  ? TRAZODONE (DESYREL) 150 MG TABLET    Take 1 tablet by mouth at bedtime.  ?Modified Medications  ? No medications on file  ?Discontinued Medications  ? No medications on file  ? ? ?HPI: Derek [name] is a 81 y.o. male With a history of Crohn's ileocolitis and prior treatment for a fistula.  He was treated with Humira several years ago but developed an adverse reaction and it was stopped.  He tells me that he has not needed any treatment for Crohn's since that time.  He also required multiple surgeries in 2018.  He was found to have colon cancer and underwent partial colectomy with ileostomy.  He had extensive lysis of adhesions.  He eventually had a takedown of his ileostomy.  He tells me that he thinks he may have had a mesh repair of a hernia at some point. ? ?Last summer he developed an area of redness over one of his lower midline incision.  After about 1 month he developed some  spontaneous drainage that was foul-smelling.  A CT scan on 03/27/2021 revealed: ? ?IMPRESSION: ?1. Status post right colectomy without evidence of complication at ?the anastomotic site. ?2. Multiple mildly fluid distended loops of small and large bowel ?throughout the abdomen without evidence of mechanical obstruction. ?This may reflect diarrhea. ?3. Some loops of small bowel are closely apposed to the anterior ?abdominal wall in a similar configuration to the prior study, ?raising suspicion for adhesion/tethering. ?4. Focal area of soft tissue thickening in the region of the ?umbilicus is unchanged compared to the prior study and likely ?reflects scar. ?5. Nonobstructing right renal calculi. ?6. Splenomegaly. ?7.  Aortic Atherosclerosis (ICD10-I70.0). ? ?He was seen by general surgery for possible stitch abscess versus enterocutaneous fistula related to Crohn's.  He underwent superficial incision and drainage of the chronic draining sinus on 09/05/2021.  A small piece of Prolene stitch was removed.  No cultures were obtained.  He was started on empiric amoxicillin/clavulanate on 09/18/2021 and took it for 2 weeks.  He noted some decrease in the amount of drainage but the wound never healed completely. ? ?He was seen last October by his gastroenterologist, Dr. Silvano Rusk.  He underwent routine follow-up colonoscopy.  Some inflammation was noted at the ileocolonic anastomosis.  Biopsies were taken which showed: ? ?Surgical [P], ileo-colonic anastomosis ?- CHRONIC ACTIVE COLITIS ?- GRANULATION TISSUE ?- NO GRANULOMATA, DYSPLASIA OR MALIGNANCY IDENTIFIED ? ?Review of Systems: ?Review of Systems  ?Constitutional:  Negative for fever and weight loss.  ?Gastrointestinal:  Positive for diarrhea. Negative for abdominal pain, nausea and vomiting.  ?     He has chronic, unchanged watery diarrhea on a daily basis.  He estimates that he has 5-6 bowel movements daily on most days.  ? ? ? ?Past Medical History:  ?Diagnosis Date  ?  Anemia   ? Anxiety   ? Aortic insufficiency   ? a. mild-mod by echo 09/2015.  ? Arthritis   ? "knees; left shoulder" (09/21/2013) oa  ? Ascending aortic aneurysm   ? stable less than 5 cm per 11-21-2020 chest ct epi

## 2021-10-31 ENCOUNTER — Telehealth: Payer: Self-pay

## 2021-10-31 NOTE — Telephone Encounter (Signed)
-----   Message from [name], MD sent at 10/30/2021  5:27 PM EDT ----- ?Regarding: Needs appointment ?Please contact the patient and tell him I saw the note from Dr. Megan Salon about setting up an appointment, so please schedule in a next available follow-up slot its okay to use a banding appointment ?----- Message ----- ?From: Michel Bickers, MD ?Sent: 10/29/2021  11:59 AM EDT ?To: [name], MD ? ? ?

## 2021-10-31 NOTE — Telephone Encounter (Signed)
Pt was notified of Dr. Carlean Purl recommendation for an office visit: Pt  attempted to be scheduled for a sooner appointment but unfortunately pt had scheduling conflicts due to pt going to school: Pt was scheduled for 12/02/2021 at 9:10: Pt made aware: ?Pt verbalized understanding with all questions answered ?

## 2021-11-12 ENCOUNTER — Ambulatory Visit: Payer: Medicare Other | Admitting: Internal Medicine

## 2021-11-17 DIAGNOSIS — J329 Chronic sinusitis, unspecified: Secondary | ICD-10-CM | POA: Diagnosis not present

## 2021-11-17 DIAGNOSIS — R300 Dysuria: Secondary | ICD-10-CM | POA: Diagnosis not present

## 2021-11-17 DIAGNOSIS — Z6828 Body mass index (BMI) 28.0-28.9, adult: Secondary | ICD-10-CM | POA: Diagnosis not present

## 2021-11-17 DIAGNOSIS — K5180 Other ulcerative colitis without complications: Secondary | ICD-10-CM | POA: Diagnosis not present

## 2021-11-17 DIAGNOSIS — K56609 Unspecified intestinal obstruction, unspecified as to partial versus complete obstruction: Secondary | ICD-10-CM | POA: Diagnosis not present

## 2021-11-17 DIAGNOSIS — E663 Overweight: Secondary | ICD-10-CM | POA: Diagnosis not present

## 2021-11-18 DIAGNOSIS — Z20822 Contact with and (suspected) exposure to covid-19: Secondary | ICD-10-CM | POA: Diagnosis not present

## 2021-11-18 DIAGNOSIS — N1832 Chronic kidney disease, stage 3b: Secondary | ICD-10-CM | POA: Diagnosis not present

## 2021-11-25 ENCOUNTER — Ambulatory Visit (INDEPENDENT_AMBULATORY_CARE_PROVIDER_SITE_OTHER): Payer: Medicare Other | Admitting: Thoracic Surgery (Cardiothoracic Vascular Surgery)

## 2021-11-25 ENCOUNTER — Ambulatory Visit
Admission: RE | Admit: 2021-11-25 | Discharge: 2021-11-25 | Disposition: A | Discharge status: Home / Self Care | Payer: Medicare Other | Source: Ambulatory Visit | Attending: Thoracic Surgery (Cardiothoracic Vascular Surgery) | Admitting: Thoracic Surgery (Cardiothoracic Vascular Surgery)

## 2021-11-25 ENCOUNTER — Encounter: Payer: Self-pay | Admitting: Thoracic Surgery (Cardiothoracic Vascular Surgery)

## 2021-11-25 VITALS — BP 111/64 | HR 68 | Resp 20 | Ht 72.0 in | Wt 212.0 lb

## 2021-11-25 DIAGNOSIS — I7121 Aneurysm of the ascending aorta, without rupture: Secondary | ICD-10-CM | POA: Diagnosis not present

## 2021-11-25 DIAGNOSIS — I700 Atherosclerosis of aorta: Secondary | ICD-10-CM | POA: Diagnosis not present

## 2021-11-25 DIAGNOSIS — I712 Thoracic aortic aneurysm, without rupture, unspecified: Secondary | ICD-10-CM
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
## 2021-11-26 ENCOUNTER — Ambulatory Visit (INDEPENDENT_AMBULATORY_CARE_PROVIDER_SITE_OTHER): Payer: Medicare Other | Admitting: Internal Medicine

## 2021-11-26 ENCOUNTER — Encounter: Payer: Self-pay | Admitting: Internal Medicine

## 2021-11-26 ENCOUNTER — Other Ambulatory Visit: Payer: Self-pay

## 2021-11-26 DIAGNOSIS — R918 Other nonspecific abnormal finding of lung field: Secondary | ICD-10-CM | POA: Diagnosis not present

## 2021-11-26 DIAGNOSIS — R197 Diarrhea, unspecified: Secondary | ICD-10-CM

## 2021-11-26 DIAGNOSIS — L988 Other specified disorders of the skin and subcutaneous tissue: Secondary | ICD-10-CM

## 2021-11-26 NOTE — Assessment & Plan Note (Signed)
His chronic draining sinus has improved again the following a repeat course of amoxicillin/clavulanate.  I am still concerned about the possibility of a fistula related to active colitis.  He is scheduled to see Dr. Carlean Purl, his gastroenterologist next week. ?

## 2021-11-26 NOTE — Assessment & Plan Note (Signed)
He developed antibiotic associated diarrhea that is improving now after stopping amoxicillin/clavulanate. ?

## 2021-11-26 NOTE — Assessment & Plan Note (Signed)
Is no groundglass opacities may represent recent pneumonia that improved following amoxicillin/clavulanate therapy.  I will check a routine and AFB sputum culture and see him back in 4 weeks.  I agree with follow-up CT scan in 3 months. ?

## 2021-11-26 NOTE — Progress Notes (Signed)
?  ? ? ? ? ?[facility] ? ?Patient Active Problem List  ? Diagnosis Date Noted  ? Ground glass opacity present on imaging of lung 11/26/2021  ?  Priority: High  ? Diarrhea 11/26/2021  ?  Priority: High  ? Draining cutaneous sinus tract 10/29/2021  ?  Priority: High  ? Personal history of colon cancer 08/14/2019  ?  Priority: Medium   ? S/P colon resection 08/14/2016  ?  Priority: Medium   ? Crohn's ileocolitis ([place]) 06/26/2009  ?  Priority: Medium   ? History of small bowel obstruction 05/09/2008  ?  Priority: Medium   ? Preop cardiovascular exam 04/21/2021  ? COVID-19 virus infection 03/29/2021  ? Hypocalcemia 03/29/2021  ? Atrial fibrillation, chronic ([place]) 03/29/2021  ? Hyperlipidemia 03/29/2021  ? Hypothyroidism 03/29/2021  ? Elevated LFTs 03/12/2019  ? Osteoarthritis of left knee 08/23/2018  ? History of gout 08/23/2018  ? Thoracic aortic aneurysm without rupture ([place]) 10/26/2017  ? Aortic valve regurgitation 11/05/2016  ? Chronic anticoagulation 08/18/2016  ? Sinus bradycardia 08/17/2016  ? Adenomatous polyp of transverse colon s/p partial colectomy 08/14/2016 08/14/2016  ? Essential hypertension 05/27/2016  ? Peripheral neuropathic pain 05/26/2016  ? GERD (gastroesophageal reflux disease) 05/26/2016  ? Paroxysmal atrial fibrillation ([place]) 05/26/2016  ? Lung nodule 05/12/2016  ? Enteric hyperoxaluria 02/21/2016  ? Secondary hyperparathyroidism ([place]) 02/21/2016  ? Pain in joint, shoulder region 10/12/2013  ? Decreased range of motion of left shoulder 10/12/2013  ? Muscle weakness (generalized) 10/12/2013  ? Restriction of joint motion 10/12/2013  ? S/P shoulder replacement 09/21/2013  ? Nonischemic cardiomyopathy ([place]) 09/07/2013  ? Anxiety 08/22/2011  ? CKD (chronic kidney disease) stage 3, GFR 30-59 ml/min (HCC) 08/22/2011  ? Thrombocytopenia ([place]) 08/22/2011  ? CORONARY ATHEROSCLEROSIS NATIVE CORONARY ARTERY 06/25/2010  ? B12 DEFICIENCY 05/09/2008  ? Gout 05/09/2008  ? POST  TRAUMATIC STRESS SYNDROME 05/09/2008  ? GERD 05/09/2008  ? HEPATITIS C Ab positive RNA neg, HX OF 05/09/2008  ? PULMONARY EMBOLISM, HX OF 05/09/2008  ? ? ?Patient's Medications  ?New Prescriptions  ? No medications on file  ?Previous Medications  ? ACIDOPHILUS LACTOBACILLUS CAPS    Take 1 capsule by mouth daily.  ? AMIODARONE (PACERONE) 200 MG TABLET    Take 1 tablet (200 mg total) by mouth daily.  ? CALCIUM CARBONATE (TUMS - DOSED IN MG ELEMENTAL CALCIUM) 500 MG CHEWABLE TABLET    Chew 2 tablets by mouth 2 (two) times daily.  ? CHOLECALCIFEROL (VITAMIN D) 2000 UNITS CAPS    Take 2,000 Units by mouth daily.  ? CYANOCOBALAMIN (,VITAMIN B-12,) 1000 MCG/ML INJECTION    Inject 1,000 mcg into the muscle. THREE TIMES A MONTH  ? DEXAMETHASONE (DECADRON) 10 MG/ML INJECTION    Inject 10 mg into the vein once. For right  heel gout  ? ELIQUIS 5 MG TABS TABLET    Take 1 tablet (5 mg total) by mouth 2 (two) times daily.  ? FAMOTIDINE (PEPCID) 20 MG TABLET    Take 40 mg by mouth daily.  ? FEBUXOSTAT (ULORIC) 40 MG TABLET    Take 80 mg by mouth daily.  ? FLUTICASONE (CUTIVATE) 0.05 % CREAM    Apply topically as needed.  ? GABAPENTIN (NEURONTIN) 100 MG CAPSULE    Take 500 mg by mouth at bedtime.  ? HYDROCODONE-ACETAMINOPHEN (NORCO) 10-325 MG TABLET    Take 1-2 tablets by mouth 4 (four) times daily as needed.  ? LEVOTHYROXINE (SYNTHROID) 50 MCG TABLET  Take 50 mcg by mouth daily before breakfast.  ? LIDOCAINE (LIDODERM) 5 %    Place 1 patch onto the skin daily. Remove & Discard patch within 12 hours or as directed by MD  ? MAGNESIUM OXIDE (MAG-OX) 400 (241.3 MG) MG TABLET    Take by mouth 2 (two) times daily. 420 mg 3 in am and 3 in pm  ? MULTIPLE VITAMIN (MULTIVITAMIN WITH MINERALS) TABS TABLET    Take 1 tablet by mouth daily.  ? ONDANSETRON (ZOFRAN) 4 MG TABLET    Take 4 mg by mouth every 8 (eight) hours as needed for nausea or vomiting.  ? PAROXETINE (PAXIL) 40 MG TABLET    20 mg daily.  ? ROSUVASTATIN (CRESTOR) 10 MG TABLET     Take 1 tablet (10 mg total) by mouth daily.  ? SODIUM BICARBONATE 650 MG TABLET    Take by mouth 2 (two) times daily. 3 tabs am and 3 tabs in pm  ? SUCRALFATE (CARAFATE) 1 GM/10ML SUSPENSION    Take 10 mLs (1 g total) by mouth at bedtime as needed.  ? TRAZODONE (DESYREL) 150 MG TABLET    Take 1 tablet by mouth at bedtime.  ?Modified Medications  ? No medications on file  ?Discontinued Medications  ? AMOXICILLIN-CLAVULANATE (AUGMENTIN) 500-125 MG TABLET    Take 1 tablet (500 mg total) by mouth 2 (two) times daily.  ? ? ?Subjective: ?Derek [name] is in with his wife, Derek [name], for his routine follow-up visit.  He has a history of Crohn's ileocolitis and prior treatment for a fistula.  He was treated with Humira several years ago but developed an adverse reaction and it was stopped.  He tells me that he has not needed any treatment for Crohn's since that time.  He also required multiple surgeries in 2018.  He was found to have colon cancer and underwent partial colectomy with ileostomy.  He had extensive lysis of adhesions.  He eventually had a takedown of his ileostomy.  He tells me that he thinks he may have had a mesh repair of a hernia at some point. ?  ?Last summer he developed an area of redness over one of his lower midline incision.  After about 1 month he developed some spontaneous drainage that was foul-smelling.  A CT scan on 03/27/2021 revealed: ?  ?IMPRESSION: ?1. Status post right colectomy without evidence of complication at ?the anastomotic site. ?2. Multiple mildly fluid distended loops of small and large bowel ?throughout the abdomen without evidence of mechanical obstruction. ?This may reflect diarrhea. ?3. Some loops of small bowel are closely apposed to the anterior ?abdominal wall in a similar configuration to the prior study, ?raising suspicion for adhesion/tethering. ?4. Focal area of soft tissue thickening in the region of the ?umbilicus is unchanged compared to the prior study and likely ?reflects scar. ?5.  Nonobstructing right renal calculi. ?6. Splenomegaly. ?7.  Aortic Atherosclerosis (ICD10-I70.0). ?  ?He was seen by general surgery for possible stitch abscess versus enterocutaneous fistula related to Crohn's.  He underwent superficial incision and drainage of the chronic draining sinus on 09/05/2021.  A small piece of Prolene stitch was removed.  No cultures were obtained.  He was started on empiric amoxicillin/clavulanate on 09/18/2021 and took it for 2 weeks.  He noted some decrease in the amount of drainage but the wound never healed completely. ?  ?He was seen last October by his gastroenterologist, Dr. Silvano Rusk.  He underwent routine follow-up colonoscopy.  Some inflammation was  noted at the ileocolonic anastomosis.  Biopsies were taken which showed: ?  ?Surgical [P], ileo-colonic anastomosis ?- CHRONIC ACTIVE COLITIS ?- GRANULATION TISSUE ?- NO GRANULOMATA, DYSPLASIA OR MALIGNANCY IDENTIFIED ? ?When I first saw him on 10/29/2021 I elected to put him back on amoxicillin/clavulanate.  2 days after restarting it his sinus drainage stopped completely.  Around that same time he noticed onset of extreme fatigue and poor appetite.  He had 1 episode of chills but did not take his temperature.  He said that he had a very mild cough productive of a little bit of white sputum.  Those symptoms have slowly improved over the last few weeks.  He developed diarrhea about 2 weeks after restarting amoxicillin/clavulanate.  He stopped taking it and his diarrhea has slowly improved. ? ?He saw Dr. Roxan Hockey, his thoracic surgeon yesterday for routine evaluation of his known aortic aneurysm.  Routine follow-up CT scan showed new pulmonary infiltrates.  Dr. Roxan Hockey plans on follow-up CT scan in 3 months. ? ?IMPRESSION: ?1. Unchanged enlargement of the tubular ascending thoracic aorta ?measuring up to 4.6 x 4.5 cm. Ascending thoracic aortic aneurysm. ?Recommend semi-annual imaging followup by CTA or MRA and referral  to ?cardiothoracic surgery if not already obtained. This recommendation ?follows 2010 ACCF/AHA/AATS/ACR/ASA/SCA/SCAI/SIR/STS/SVM Guidelines ?for the Diagnosis and Management of Patients With Thoracic Aortic ?Disease

## 2021-12-01 DIAGNOSIS — N1832 Chronic kidney disease, stage 3b: Secondary | ICD-10-CM | POA: Diagnosis not present

## 2021-12-03 ENCOUNTER — Encounter: Payer: Self-pay | Admitting: Internal Medicine

## 2021-12-03 ENCOUNTER — Ambulatory Visit (INDEPENDENT_AMBULATORY_CARE_PROVIDER_SITE_OTHER): Payer: Medicare Other | Admitting: Internal Medicine

## 2021-12-03 VITALS — BP 120/50 | HR 93 | Ht 72.0 in | Wt 213.0 lb

## 2021-12-03 DIAGNOSIS — Z85038 Personal history of other malignant neoplasm of large intestine: Secondary | ICD-10-CM

## 2021-12-03 DIAGNOSIS — K5080 Crohn's disease of both small and large intestine without complications: Secondary | ICD-10-CM | POA: Diagnosis not present

## 2021-12-03 DIAGNOSIS — L988 Other specified disorders of the skin and subcutaneous tissue: Secondary | ICD-10-CM | POA: Diagnosis not present

## 2021-12-03 NOTE — Patient Instructions (Addendum)
Glad the drainage stopped. ?If it returns let me know and we will do a CT scan. ? ?I want to see you in 1 year - sooner if needed. ? ?I appreciate the opportunity to care for you. ?[name], MD, [facility] ? ?

## 2021-12-03 NOTE — Progress Notes (Signed)
? ?Derek [name] 81 y.o. [birth year] [id number] ? ?Assessment & Plan:  ? ?Encounter Diagnoses  ?Name Primary?  ? Crohn's disease of both small and large intestine without complication ([place]) Yes  ? Draining cutaneous sinus tract   ? Personal history of colon cancer   ? ?Though I understand the concern about the possibility of Crohn's disease causing this, the drainage has resolved and none of the imaging along the way indicated fistulous Crohn's disease.  He does have some chronic inflammation at his anastomosis but it was limited and he does not desire treatment and we will monitor that.  He is abdomen shows surgical scars but no signs of active inflammation.  We talked about CT scanning to double check things at this time but he is not inclined to do so and I think that is fine.  He will contact me if he has more drainage.  I guess at this point it seems like he just took a long time to heal related to this retained suture. ? ? ?Regarding his personal history of colon cancer he has had surveillance colonoscopy about 2 years after his diagnosis and resection and then 3 years after that so the next routine wound would be 5 years from last, and he was not so inclined to pursue that and we will just monitor things.  I will see him again in 1 year sooner if needed.  He will remain in observation off of Crohn's therapy. ? ?I appreciate the opportunity to care for this patient. ?CC: Sharilyn Sites, MD ?Dr. Michel Bickers ? ? ? ?Subjective:  ? ?Chief Complaint: Crohn's disease, draining sinus ? ?HPI ?Derek [name] is a 81 year old white man with a long history of Crohn's disease of the small and large intestine, colon cancer thought related to Crohn's disease and recent problems with a draining area in the abdominal wall.  This started in summer 2022, he saw surgery and a stitch was removed and it was thought that that was the issue, he was treated with Augmentin over weeks but it was very slow to heal so he was sent to  infectious disease and has seen Dr. Megan Salon.  In March he was still having problems and Dr. Megan Salon was concerned about the possibility of a fistula.  CT scanning in July and August of last year for these problems did show subcutaneous changes in the periumbilical area but no evidence of active Crohn's disease or fistula.  Fortunately the drainage did stop in the last month or so and he was not draining at a follow-up visit with Dr. Megan Salon in April.  He continues to have no problems at the area.  No tenderness no bowel habit changes (regular defecation formed stool no bleeding).  He had a colonoscopy last in October 2022, there was a diminutive polyp removed but not retrieved, there was some inflammatory change at the ileocolonic anastomosis, and biopsies did show chronic active colitis and that with his known Crohn's disease.  He has been off therapy for many years and he has not been symptomatic and does not desire treatment. ? ? ?He is here alone today, usually with his wife but her arthritis has been flaring and she also needs to get a pacemaker changed. ?Allergies  ?Allergen Reactions  ? Lorazepam Other (See Comments)  ?  Reaction:  Hallucinations   ? Humira [Adalimumab] Other (See Comments)  ?  Pt states that he got pancreatitis.    ? Ancef [Cefazolin] Hives  ? Colchicine   ?  cramps  ? Other   ?  Cannot have ct scans with dye due to kidney function  ? Quinolones   ?  Patient was warned about not using Cipro and similar antibiotics. ?Recent studies have raised concern that fluoroquinolone antibiotics could be associated with an increased risk of aortic aneurysm ?Fluoroquinolones have non-antimicrobial properties that might jeopardise the integrity of the extracellular matrix of the vascular wall ?In a  propensity score matched cohort study in [place], there was a 66% increased rate of aortic aneurysm or dissection associated with oral fluoroquinolone use, compared wit  ? ?Current Meds  ?Medication Sig  ?  Acidophilus Lactobacillus CAPS Take 1 capsule by mouth daily.  ? amiodarone (PACERONE) 200 MG tablet Take 1 tablet (200 mg total) by mouth daily.  ? calcium carbonate (TUMS - DOSED IN MG ELEMENTAL CALCIUM) 500 MG chewable tablet Chew 2 tablets by mouth 2 (two) times daily.  ? Cholecalciferol (VITAMIN D) 2000 units CAPS Take 2,000 Units by mouth daily.  ? cyanocobalamin (,VITAMIN B-12,) 1000 MCG/ML injection Inject 1,000 mcg into the muscle. THREE TIMES A MONTH  ? dexamethasone (DECADRON) 10 MG/ML injection Inject 10 mg into the vein once. For right  heel gout  ? ELIQUIS 5 MG TABS tablet Take 1 tablet (5 mg total) by mouth 2 (two) times daily.  ? famotidine (PEPCID) 20 MG tablet Take 40 mg by mouth daily.  ? febuxostat (ULORIC) 40 MG tablet Take 80 mg by mouth daily.  ? fluticasone (CUTIVATE) 0.05 % cream Apply topically as needed.  ? gabapentin (NEURONTIN) 100 MG capsule Take 500 mg by mouth at bedtime.  ? levothyroxine (SYNTHROID) 50 MCG tablet Take 50 mcg by mouth daily before breakfast.  ? lidocaine (LIDODERM) 5 % Place 1 patch onto the skin daily. Remove & Discard patch within 12 hours or as directed by MD (Patient taking differently: Place 1 patch onto the skin daily as needed. Remove & Discard patch within 12 hours or as directed by MD)  ? magnesium oxide (MAG-OX) 400 (241.3 Mg) MG tablet Take by mouth 2 (two) times daily. 420 mg 3 in am and 3 in pm  ? Multiple Vitamin (MULTIVITAMIN WITH MINERALS) TABS tablet Take 1 tablet by mouth daily.  ? ondansetron (ZOFRAN) 4 MG tablet Take 4 mg by mouth every 8 (eight) hours as needed for nausea or vomiting.  ? PARoxetine (PAXIL) 40 MG tablet 20 mg daily.  ? rosuvastatin (CRESTOR) 10 MG tablet Take 1 tablet (10 mg total) by mouth daily. (Patient taking differently: Take 10 mg by mouth daily. Cuts 20 mg in half in am)  ? sodium bicarbonate 650 MG tablet Take by mouth 2 (two) times daily. 3 tabs am and 3 tabs in pm  ? sucralfate (CARAFATE) 1 GM/10ML suspension Take 10 mLs  (1 g total) by mouth at bedtime as needed.  ? traZODone (DESYREL) 150 MG tablet Take 1 tablet by mouth at bedtime.  ? ?Past Medical History:  ?Diagnosis Date  ? Anemia   ? Anxiety   ? Aortic insufficiency   ? a. mild-mod by echo 09/2015.  ? Arthritis   ? "knees; left shoulder" (09/21/2013) oa  ? Ascending aortic aneurysm ([place])   ? stable less than 5 cm per 11-21-2020 chest ct epic  ? Atrial fibrillation ([place])   ? B12 deficiency   ? takes Vit 12 shot every 14days   ? Bowel perforation ([place]) 08/27/2016  ? CKD (chronic kidney disease) stage 3 B, GFR 30-59 ml/min ([facility])  08/22/2011  ? dr patel Cassell Clement 08-26-2021  ? Colon cancer ([place]) 2018  ? surgery  ? Colonic ischemia ([place]) 08/27/2016  ? Coronary artery disease   ? COVID-19 03/29/2021  ? hospitalized from 03-29-2021 to 03-31-2021  ? Crohn's disease ([place])   ? Enteric hyperoxaluria 02/21/2016  ? GERD (gastroesophageal reflux disease)   ? Gout   ? takes Uloric  daily  ? Gout flare 09/02/2021  ? right heel decadron 10 mg im given  ? Hearing loss   ? Heart murmur   ? Hematuria   ? few weeks ago per pt wife pam on 09-03-2021  ? Hepatitis C 1978  ? negtive RNA load - spontaneously cleared  ? Hiatal hernia   ? High output ileostomy ([facility]) 06/02/2017  ? History of blood transfusion 1978; 1990's; ?  ? "w/bowel resection; S/P allupurinol; ?" (09/21/2013)  ? History of colon polyps   ? History of dvt 2006  ? late 1980's earlt 1990's from jumping from plane left leg  ? History of kidney stones   ? 1980's  ? History of MRSA infection 2010  ? History of small bowel obstruction   ? History of staph infection 1978  ? Hyperoxaluria   ? Intestinal  ? Hypertension   ? Hypothyroidism   ? Insomnia   ? takes Trazodone nightly  ? Internal hemorrhoids   ? LV dysfunction   ? a. h/o EF 45-50% in 2015, normalized on subsequent echoes.  ? Nephrolithiasis   ? Nocardia infection 09/2016  ? resolved  ? Pancreatitis 2010  ? elevated lipase and amylase, stranding in tail of pancreas, ? from Humira  ? Pancytopenia   ?  Hx of  ? Peripheral neuropathy   ? takes Gabapentin daily in both legs  ? Pneumonia   ? several times last times 2012  ? PTSD (post-traumatic stress disorder)   ? takes paxil for  ? Pulmonary nodule   ? a. 1m

## 2021-12-08 DIAGNOSIS — N2581 Secondary hyperparathyroidism of renal origin: Secondary | ICD-10-CM | POA: Diagnosis not present

## 2021-12-08 DIAGNOSIS — N1832 Chronic kidney disease, stage 3b: Secondary | ICD-10-CM | POA: Diagnosis not present

## 2021-12-08 DIAGNOSIS — I129 Hypertensive chronic kidney disease with stage 1 through stage 4 chronic kidney disease, or unspecified chronic kidney disease: Secondary | ICD-10-CM | POA: Diagnosis not present

## 2021-12-08 DIAGNOSIS — D631 Anemia in chronic kidney disease: Secondary | ICD-10-CM | POA: Diagnosis not present

## 2021-12-23 DIAGNOSIS — Z4889 Encounter for other specified surgical aftercare: Secondary | ICD-10-CM | POA: Diagnosis not present

## 2021-12-24 ENCOUNTER — Ambulatory Visit: Payer: Medicare Other | Admitting: Internal Medicine

## 2022-01-07 ENCOUNTER — Other Ambulatory Visit: Payer: Self-pay | Admitting: Thoracic Surgery (Cardiothoracic Vascular Surgery)

## 2022-01-07 DIAGNOSIS — R911 Solitary pulmonary nodule: Secondary | ICD-10-CM

## 2022-01-14 DIAGNOSIS — Z6828 Body mass index (BMI) 28.0-28.9, adult: Secondary | ICD-10-CM | POA: Diagnosis not present

## 2022-01-14 DIAGNOSIS — S335XXA Sprain of ligaments of lumbar spine, initial encounter: Secondary | ICD-10-CM | POA: Diagnosis not present

## 2022-01-14 DIAGNOSIS — E6609 Other obesity due to excess calories: Secondary | ICD-10-CM | POA: Diagnosis not present

## 2022-01-14 DIAGNOSIS — G894 Chronic pain syndrome: Secondary | ICD-10-CM | POA: Diagnosis not present

## 2022-02-18 NOTE — Progress Notes (Signed)
Synopsis: Referred for dyspnea by Shella Spearing, PA-C  Subjective:   PATIENT ID: Derek [name] GENDER: male DOB: [birth year], MRN: [id number]  Chief Complaint  Patient presents with   Pulmonary Consult    Referred by Shella Spearing, NP. Pt on Amiodarone. He states occ SOB when he lies down at night.    81yM with history of anemia, anxiety, AF on amiodarone for 3-4 years, CAD, Crohn's, GERD, covid-19 - hospitalized in 2022, colon cancer, AAA, remote DVT, nocardia BSI 2018, remote hypothyroid referred for dyspnea  CT Chest obtained for surveillance of ascending aortic aneurysm revealed multifocal nodular opacities a couple weeks after febrile illness, cough. Seen by ID with plan for repeat CT Chest in 3 months and sputum cultures, sputum cultures never collected. At some point around that time had course of augmentin.  He says that once in a while he feels like he needs to take a deep breath. Especially when he lies down. He has had some cough, maybe some chest congestion. No fever. He plan to set up appointment for surveillance CT Chest later today.  He doesn't snore. No PND. Does take naps in evenings these days.   Otherwise pertinent review of systems is negative.  He has no family history of lung disease  40 py smoker quit 1977. 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 Tries to wear a mask. Doesn't feel worse from a respiratory standpoint when he's there.   Past Medical History:  Diagnosis Date   Anemia    Anxiety    Aortic insufficiency    a. mild-mod by echo 09/2015.   Arthritis    "knees; left shoulder" (09/21/2013) oa   Ascending aortic aneurysm (HCC)    stable less than 5 cm per 11-21-2020 chest ct epic   Atrial fibrillation ([place])    B12 deficiency    takes Vit 12 shot every 14days    Bowel perforation ([place]) 08/27/2016   CKD (chronic kidney disease) stage 3 B, GFR  30-59 ml/min ([place]) 08/22/2011   dr patel Cassell Clement 08-26-2021   Colon cancer [facility]) 2018   surgery   Colonic ischemia ([place]) 08/27/2016   Coronary artery disease    COVID-19 03/29/2021   hospitalized from 03-29-2021 to 03-31-2021   Crohn's disease ([place])    Enteric hyperoxaluria 02/21/2016   GERD (gastroesophageal reflux disease)    Gout    takes Uloric  daily   Gout flare 09/02/2021   right heel decadron 10 mg im given   Hearing loss    Heart murmur    Hematuria    few weeks ago per pt wife pam on 09-03-2021   Hepatitis C 1978   negtive RNA load - spontaneously cleared   Hiatal hernia    High output ileostomy ([place]) 06/02/2017   History of blood transfusion 1978; 1990's; ?   "w/bowel resection; S/P allupurinol; ?" (09/21/2013)   History of colon polyps    History of dvt 2006   late 1980's earlt 1990's from jumping from plane left leg   History of kidney stones    1980's   History of MRSA infection 2010   History of small bowel obstruction    History of staph infection 1978   Hyperoxaluria    Intestinal   Hypertension    Hypothyroidism    Insomnia    takes Trazodone nightly   Internal hemorrhoids    LV dysfunction  a. h/o EF 45-50% in 2015, normalized on subsequent echoes.   Nephrolithiasis    Nocardia infection 09/2016   resolved   Pancreatitis 2010   elevated lipase and amylase, stranding in tail of pancreas, ? from Humira   Pancytopenia    Hx of   Peripheral neuropathy    takes Gabapentin daily in both legs   Pneumonia    several times last times 2012   PTSD (post-traumatic stress disorder)    takes paxil for   Pulmonary nodule    a. 43m by CT 05/2015, recommended f/u 6-12 months.   Renal tubular acidosis    RLS (restless legs syndrome)    Rosacea conjunctivitis(372.31)    Secondary [place]parathyroidism [place] 02/21/2016   Shingles 2022   head close to eye   Short bowel syndrome    Sinus bradycardia 2018   none since   Skin cancer    "cut/burned off left ear and  face" (09/21/2013)   Small bowel obstruction ([place] 1978   Status post reversal of ileostomy 07/23/2017   Thrombocytopenia ([place]    problems since 1978   Wears glasses reading      Family History  Problem Relation Age of Onset   Aneurysm Mother    Emphysema Mother        smoked   Kidney disease Father    Hypertension Father    Aneurysm Sister    Esophageal cancer Neg Hx    Stomach cancer Neg Hx    Rectal cancer Neg Hx    Colon cancer Neg Hx      Past Surgical History:  Procedure Laterality Date   ANKLE SURGERY Right    APPENDECTOMY  1978   BOWEL RESECTION  1978 X 2   CARDIOVERSION N/A 05/29/2016   Procedure: CARDIOVERSION;  Surgeon: MSanda Klein MD;  [facility];  Service: Cardiovascular;  Laterality: N/A;   CHOLECYSTECTOMY     yrs ago   Colon Cancer     COLON RESECTION N/A 08/14/2016   Procedure: LAPAROSCOPIC RESECTION TRANSVERSE COLON;  Surgeon: DAlphonsa Overall MD;  [facility];  Service: General;  Laterality: N/A;   COLON SURGERY  2018   ileostomy   COLONOSCOPY     ESOPHAGOGASTRODUODENOSCOPY     EXTRACORPOREAL SHOCK WAVE LITHOTRIPSY     yrs ago   EYE SURGERY     cataract surgery bilateral   FOOT SURGERY Right    "took gout out"   HEMICOLECTOMY Right    ILEOCECETOMY  1978   /notes 05/10/2000  (09/21/2013)   ILEOSTOMY     ILEOSTOMY CLOSURE N/A 07/23/2017   Procedure: ILEOSTOMY REVERSAL ;  Surgeon: NAlphonsa Overall MD;  [facility];  Service: General;  Laterality: N/A;   INGUINAL HERNIA REPAIR Right    IR FLUORO GUIDE CV LINE RIGHT  05/07/2017   IR REMOVAL TUN CV CATH W/O FL  10/08/2017   IR [place]ASC ACCESS RIGHT  05/07/2017   KNEE ARTHROSCOPY Left    LAPAROTOMY N/A 08/27/2016   Procedure: EXPLORATORYLAPAROTOMY, LYSIS OF ADHESIONS, ILEOSTOMY, RIGHT COLECTOMY;  Surgeon: DAlphonsa Overall MD;  [facility];  Service: General;  Laterality: N/A;   LIGAMENT REPAIR Left    POLYPECTOMY     right big toe surgery  06/2021   [place]   TEE  WITHOUT CARDIOVERSION N/A 05/29/2016   Procedure: TRANSESOPHAGEAL ECHOCARDIOGRAM (TEE);  Surgeon: MSanda Klein MD;  [facility]  Service: Cardiovascular;  Laterality: N/A;   TOTAL SHOULDER ARTHROPLASTY Left 09/21/2013  Procedure: LEFT TOTAL SHOULDER ARTHROPLASTY;  Surgeon: Marin Shutter, MD;  Location: [place];  Service: Orthopedics;  Laterality: Left;   WOUND DEBRIDEMENT N/A 09/05/2021   Procedure: CHRONIC ABDOMINAL WOUND EXPLORATION, REMOVAL OF PROLENE STITCH ABCESS;  Surgeon: Stechschulte, Nickola Major, MD;  Location: [place];  Service: General;  Laterality: N/A;    Social History   Socioeconomic History   Marital status: Married    Spouse name: [name]   Number of children: 2   Years of education: Not on file   Highest education level: Not on file  Occupational History   Occupation: Retired  Tobacco Use   Smoking status: Former    Packs/day: 2.00    Years: 20.00    Total pack years: 40.00    Types: Cigarettes    Quit date: 08/04/1975    Years since quitting: 46.5   Smokeless tobacco: Former    Types: Chew    Quit date: 08/03/1978   Tobacco comments:    09/21/2013 "quit smoking in the late 1970's; stopped chewing couple years after I quit smoking"  Vaping Use   Vaping Use: Never used  Substance and Sexual Activity   Alcohol use: No   Drug use: No   Sexual activity: Not Currently  Other Topics Concern   Not on file  Social History Narrative   Married 2 children and 6 grandchildren all local   [place] [employer]   Daily caffeine   Does not exercise regularly   Social Determinants of Health   Financial Resource Strain: Low Risk  (03/21/2019)   Overall Financial Resource Strain (CARDIA)    Difficulty of Paying Living Expenses: Not hard at all  Food Insecurity: No Food Insecurity (03/21/2019)   Hunger Vital Sign    Worried About Running Out of Food in the Last Year: Never true    [place] in the Last Year: Never true  Transportation Needs: No  Transportation Needs (03/21/2019)   PRAPARE - Hydrologist (Medical): No    Lack of Transportation (Non-Medical): No  Physical Activity: Not on file  Stress: Not on file  Social Connections: Not on file  Intimate Partner Violence: Not on file     Allergies  Allergen Reactions   Lorazepam Other (See Comments)    Reaction:  Hallucinations    Humira [Adalimumab] Other (See Comments)    Pt states that he got pancreatitis.     Ancef [Cefazolin] Hives   Colchicine     cramps   Other     Cannot have ct scans with dye due to kidney function   Quinolones     Patient was warned about not using Cipro and similar antibiotics. Recent studies have raised concern that fluoroquinolone antibiotics could be associated with an increased risk of aortic aneurysm Fluoroquinolones have non-antimicrobial properties that might jeopardise the integrity of the extracellular matrix of the vascular wall In a  propensity score matched cohort study in [place], there was a 66% increased rate of aortic aneurysm or dissection associated with oral fluoroquinolone use, compared wit     Outpatient Medications Prior to Visit  Medication Sig Dispense Refill   Acidophilus Lactobacillus CAPS Take 1 capsule by mouth daily. 90 capsule 3   amiodarone (PACERONE) 200 MG tablet Take 1 tablet (200 mg total) by mouth daily. 90 tablet 3   calcium carbonate (TUMS - DOSED IN MG ELEMENTAL CALCIUM) 500 MG chewable tablet Chew 2 tablets by mouth 2 (two) times  daily.     cyanocobalamin (,VITAMIN B-12,) 1000 MCG/ML injection Inject 1,000 mcg into the muscle. THREE TIMES A MONTH     dexamethasone (DECADRON) 10 MG/ML injection Inject 10 mg into the vein once. For right  heel gout     ELIQUIS 5 MG TABS tablet Take 1 tablet (5 mg total) by mouth 2 (two) times daily. 180 tablet 3   famotidine (PEPCID) 20 MG tablet Take 40 mg by mouth daily.     febuxostat (ULORIC) 40 MG tablet Take 80 mg by mouth daily.      fluticasone (CUTIVATE) 0.05 % cream Apply topically as needed.     gabapentin (NEURONTIN) 100 MG capsule Take by mouth. 4 every am and 1 at bedtime     HYDROcodone-acetaminophen (NORCO) 10-325 MG tablet Take 1-2 tablets by mouth 4 (four) times daily as needed.     levothyroxine (SYNTHROID) 50 MCG tablet Take 50 mcg by mouth daily before breakfast.     lidocaine (LIDODERM) 5 % Place 1 patch onto the skin daily. Remove & Discard patch within 12 hours or as directed by MD (Patient taking differently: Place 1 patch onto the skin daily as needed. Remove & Discard patch within 12 hours or as directed by MD) 30 patch 0   magnesium oxide (MAG-OX) 400 (241.3 Mg) MG tablet Take by mouth 2 (two) times daily. 420 mg 3 in am and 3 in pm     Multiple Vitamin (MULTIVITAMIN WITH MINERALS) TABS tablet Take 1 tablet by mouth daily.     ondansetron (ZOFRAN) 4 MG tablet Take 4 mg by mouth every 8 (eight) hours as needed for nausea or vomiting.     PARoxetine (PAXIL) 40 MG tablet 20 mg daily.     rosuvastatin (CRESTOR) 10 MG tablet Take 1 tablet (10 mg total) by mouth daily. (Patient taking differently: Take 10 mg by mouth daily. Cuts 20 mg in half in am) 90 tablet 3   sodium bicarbonate 650 MG tablet Take by mouth 2 (two) times daily. 3 tabs am and 3 tabs in pm     traZODone (DESYREL) 150 MG tablet Take 1 tablet by mouth at bedtime.     Cholecalciferol (VITAMIN D) 2000 units CAPS Take 2,000 Units by mouth daily.     sucralfate (CARAFATE) 1 GM/10ML suspension Take 10 mLs (1 g total) by mouth at bedtime as needed. 420 mL 11   No facility-administered medications prior to visit.       Objective:   Physical Exam:  General appearance: 81 y.o., male, NAD, conversant  Eyes: anicteric sclerae; PERRL, tracking appropriately HENT: NCAT; MMM Neck: Trachea midline; no lymphadenopathy, no JVD Lungs: CTAB, no crackles, no wheeze, with normal respiratory effort CV: RRR, no murmur  Abdomen: Soft, non-tender;  non-distended, BS present  Extremities: No peripheral edema, warm Skin: Normal turgor and texture; no rash Psych: Appropriate affect Neuro: Alert and oriented to person and place, no focal deficit     Vitals:   02/20/22 1101  BP: 118/66  Pulse: 63  Temp: 98 F (36.7 C)  TempSrc: Oral  SpO2: 96%  Weight: 208 lb (94.3 kg)  Height: 6' (1.829 m)   96% on RA BMI Readings from Last 3 Encounters:  02/20/22 28.21 kg/m  12/03/21 28.89 kg/m  11/26/21 28.75 kg/m   Wt Readings from Last 3 Encounters:  02/20/22 208 lb (94.3 kg)  12/03/21 213 lb (96.6 kg)  11/26/21 212 lb (96.2 kg)     CBC  Component Value Date/Time   WBC 6.6 03/31/2021 0627   RBC 3.77 (L) 03/31/2021 0627   HGB 11.4 (L) 03/31/2021 0627   HGB 13.7 12/25/2019 1042   HGB 14.1 11/23/2012 1047   HCT 35.4 (L) 03/31/2021 0627   HCT 41.9 12/25/2019 1042   HCT 41.8 11/23/2012 1047   PLT 99 (L) 03/31/2021 0627   PLT 155 12/25/2019 1042   MCV 93.9 03/31/2021 0627   MCV 92 12/25/2019 1042   MCV 86.7 11/23/2012 1047   MCH 30.2 03/31/2021 0627   MCHC 32.2 03/31/2021 0627   RDW 15.9 (H) 03/31/2021 0627   RDW 13.4 12/25/2019 1042   RDW 13.9 11/23/2012 1047   LYMPHSABS 0.3 (L) 03/31/2021 0627   LYMPHSABS 0.9 11/23/2012 1047   MONOABS 0.1 03/31/2021 0627   MONOABS 0.3 11/23/2012 1047   EOSABS 0.0 03/31/2021 0627   EOSABS 0.2 11/23/2012 1047   BASOSABS 0.0 03/31/2021 0627   BASOSABS 0.0 11/23/2012 1047      Chest Imaging: CT Chest 11/25/21 with multifocal nodular opacities centered on bronchovascular bundles, new relative to priors  Borderline lower lobe predominant traction bronchiectasis on prior CT Chests  Pulmonary Functions Testing Results:    Latest Ref Rng & Units 01/05/2017   11:54 AM 11/06/2016    2:45 PM  PFT Results  FVC-Pre L 3.64  1.36   FVC-Predicted Pre % 82  30   FVC-Post L 3.89    FVC-Predicted Post % 87    Pre FEV1/FVC % % 82  97   Post FEV1/FCV % % 81    FEV1-Pre L 2.99  1.32    FEV1-Predicted Pre % 92  40   FEV1-Post L 3.14    DLCO uncorrected ml/min/mmHg 20.30    DLCO UNC% % 60    DLCO corrected ml/min/mmHg 21.26    DLCO COR %Predicted % 63    DLVA Predicted % 85    TLC L 5.05    TLC % Predicted % 69    RV % Predicted % 52        Echocardiogram:  TTE 2021:  1. Left ventricular ejection fraction, by visual estimation, is 60 to  65%. The left ventricle has normal function. There is no left ventricular  hypertrophy.   2. Abnormal septal motion consistent with left bundle branch block.   3. The left ventricle has no regional wall motion abnormalities.   4. Global right ventricle has normal systolic function.The right  ventricular size is normal. No increase in right ventricular wall  thickness.   5. Left atrial size was mildly dilated.   6. Right atrial size was normal.   7. The mitral valve is normal in structure. No evidence of mitral valve  regurgitation. No evidence of mitral stenosis.   8. The tricuspid valve is normal in structure.   9. The tricuspid valve is normal in structure. Tricuspid valve  regurgitation is not demonstrated.  10. The aortic valve is tricuspid. Aortic valve regurgitation is mild. No  evidence of aortic valve sclerosis or stenosis.  11. The pulmonic valve was normal in structure. Pulmonic valve  regurgitation is not visualized.  12. 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  13. The inferior vena cava is normal in size with greater than 50%  respiratory variability, suggesting right atrial pressure of 3 mmHg.  14. Aortic root 69m. Consider CTA of aorta for correlation of  measurement.  15. The average left ventricular global longitudinal strain is -18.8 %.  NM stress 10/2020: The left ventricular ejection fraction is mildly decreased (45-54%). Nuclear stress EF: 52%. There was no ST segment deviation noted during stress. There is a small defect of mild severity present in the apex location. The  defect is non-reversible. In the setting of normal LVF, this is consistent with attenuation artifact. No ischemia noted. This is a low risk study.       Assessment & Plan:   # Atypical pneumonia Symptomatically doing far better than he was back in the spring. Plans for surveillance CT Chest with TCTS, ID.  # Restrictive lung disease # Mildly reduced diffusing capacity Maybe has borderline traction bronchiectasis on prior CT Chest.  # Long term amiodarone use While he does have restriction on last PFT and mildly reduced diffusing capacity, it's not clear that he's developed related ILD from this.   Plan: - will follow up on results of upcoming CT Chest - PFTs next visit   RTC 6 weeks to review PFTs and CT Chest  Maryjane Hurter, MD [facility] 02/20/2022 11:06 AM

## 2022-02-20 ENCOUNTER — Encounter: Payer: Self-pay | Admitting: Student

## 2022-02-20 ENCOUNTER — Ambulatory Visit (INDEPENDENT_AMBULATORY_CARE_PROVIDER_SITE_OTHER): Payer: Medicare Other | Admitting: Student

## 2022-02-20 VITALS — BP 118/66 | HR 63 | Temp 98.0°F | Ht 72.0 in | Wt 208.0 lb

## 2022-02-20 DIAGNOSIS — J984 Other disorders of lung: Secondary | ICD-10-CM | POA: Diagnosis not present

## 2022-02-20 DIAGNOSIS — J189 Pneumonia, unspecified organism: Secondary | ICD-10-CM

## 2022-02-20 NOTE — Patient Instructions (Signed)
-   We will check breathing tests on same day as next clinic appointment in 6 weeks - Make sure to get your CT Chest done before next appointment - if you have any trouble scheduling this before our next appointment please call our clinic or send [place] a my chart message - see you in 6 weeks!

## 2022-02-24 ENCOUNTER — Ambulatory Visit: Payer: Medicare Other | Admitting: Thoracic Surgery (Cardiothoracic Vascular Surgery)

## 2022-02-24 ENCOUNTER — Other Ambulatory Visit: Payer: Medicare Other

## 2022-03-09 DIAGNOSIS — M542 Cervicalgia: Secondary | ICD-10-CM | POA: Diagnosis not present

## 2022-03-09 DIAGNOSIS — M47892 Other spondylosis, cervical region: Secondary | ICD-10-CM | POA: Diagnosis not present

## 2022-03-24 ENCOUNTER — Encounter: Payer: Self-pay | Admitting: Thoracic Surgery (Cardiothoracic Vascular Surgery)

## 2022-03-24 ENCOUNTER — Ambulatory Visit
Admission: RE | Admit: 2022-03-24 | Discharge: 2022-03-24 | Disposition: A | Discharge status: Home / Self Care | Payer: Medicare Other | Source: Ambulatory Visit | Attending: Thoracic Surgery (Cardiothoracic Vascular Surgery) | Admitting: Thoracic Surgery (Cardiothoracic Vascular Surgery)

## 2022-03-24 ENCOUNTER — Ambulatory Visit (INDEPENDENT_AMBULATORY_CARE_PROVIDER_SITE_OTHER): Payer: Medicare Other | Admitting: Thoracic Surgery (Cardiothoracic Vascular Surgery)

## 2022-03-24 VITALS — BP 127/72 | HR 68 | Resp 20 | Ht 72.0 in | Wt 206.0 lb

## 2022-03-24 DIAGNOSIS — R918 Other nonspecific abnormal finding of lung field: Secondary | ICD-10-CM | POA: Diagnosis not present

## 2022-03-24 DIAGNOSIS — R911 Solitary pulmonary nodule: Secondary | ICD-10-CM | POA: Diagnosis not present

## 2022-03-24 DIAGNOSIS — I7121 Aneurysm of the ascending aorta, without rupture: Secondary | ICD-10-CM | POA: Diagnosis not present

## 2022-03-24 DIAGNOSIS — I700 Atherosclerosis of aorta: Secondary | ICD-10-CM | POA: Diagnosis not present

## 2022-03-24 NOTE — Progress Notes (Signed)
[address]       [place] [address]             [phone]     HPI: Derek [name] returns for follow-up of pulmonary infiltrates noted on recent CT of the chest.  Derek [name] is a 81 year old gentleman with a history of hypertension, paroxysmal A-fib, nonischemic cardiomyopathy, ascending aneurysm, Crohn's disease, colon cancer, remote PE and stage IV chronic kidney disease.  He was previously followed by Derek [name].  I saw him in the office back in April.  He had recently had pneumonia treated with Augmentin.  His CT showed a stable 4.6 cm aneurysm but he was also noted to have extensive groundglass opacities in the lungs.  He has been feeling well.  He denies any chest pain, pressure, or tightness.  Gets short of breath with heavy exertion but not with routine activities.  Past Medical History:  Diagnosis Date   Anemia    Anxiety    Aortic insufficiency    a. mild-mod by echo 09/2015.   Arthritis    "knees; left shoulder" (09/21/2013) oa   Ascending aortic aneurysm (HCC)    stable less than 5 cm per 11-21-2020 chest ct epic   Atrial fibrillation ([place])    B12 deficiency    takes Vit 12 shot every 14days    Bowel perforation ([place]) 08/27/2016   CKD (chronic kidney disease) stage 3 B, GFR 30-59 ml/min ([place]) 08/22/2011   dr patel Cassell Clement 08-26-2021   Colon cancer [facility]) 2018   surgery   Colonic ischemia ([place]) 08/27/2016   Coronary artery disease    COVID-19 03/29/2021   hospitalized from 03-29-2021 to 03-31-2021   Crohn's disease ([place])    Enteric hyperoxaluria 02/21/2016   GERD (gastroesophageal reflux disease)    Gout    takes Uloric  daily   Gout flare 09/02/2021   right heel decadron 10 mg im given   Hearing loss    Heart murmur    Hematuria    few weeks ago per pt wife pam on 09-03-2021   Hepatitis C 1978   negtive RNA load - spontaneously cleared   Hiatal hernia    High output ileostomy ([place]) 06/02/2017   History of blood transfusion  1978; 1990's; ?   "w/bowel resection; S/P allupurinol; ?" (09/21/2013)   History of colon polyps    History of dvt 2006   late 1980's earlt 1990's from jumping from plane left leg   History of kidney stones    1980's   History of MRSA infection 2010   History of small bowel obstruction    History of staph infection 1978   Hyperoxaluria    Intestinal   Hypertension    Hypothyroidism    Insomnia    takes Trazodone nightly   Internal hemorrhoids    LV dysfunction    a. h/o EF 45-50% in 2015, normalized on subsequent echoes.   Nephrolithiasis    Nocardia infection 09/2016   resolved   Pancreatitis 2010   elevated lipase and amylase, stranding in tail of pancreas, ? from Humira   Pancytopenia    Hx of   Peripheral neuropathy    takes Gabapentin daily in both legs   Pneumonia    several times last times 2012   PTSD (post-traumatic stress disorder)    takes paxil for   Pulmonary nodule    a. 63m by CT 05/2015, recommended f/u 6-12 months.   Renal tubular  acidosis    RLS (restless legs syndrome)    Rosacea conjunctivitis(372.31)    Secondary hyperparathyroidism ([place]) 02/21/2016   Shingles 2022   head close to eye   Short bowel syndrome    Sinus bradycardia 2018   none since   Skin cancer    "cut/burned off left ear and face" (09/21/2013)   Small bowel obstruction ([place]) 1978   Status post reversal of ileostomy 07/23/2017   Thrombocytopenia ([place])    problems since 1978   Wears glasses reading     Current Outpatient Medications  Medication Sig Dispense Refill   Acidophilus Lactobacillus CAPS Take 1 capsule by mouth daily. 90 capsule 3   amiodarone (PACERONE) 200 MG tablet Take 1 tablet (200 mg total) by mouth daily. 90 tablet 3   calcium carbonate (TUMS - DOSED IN MG ELEMENTAL CALCIUM) 500 MG chewable tablet Chew 2 tablets by mouth 2 (two) times daily.     cyanocobalamin (,VITAMIN B-12,) 1000 MCG/ML injection Inject 1,000 mcg into the muscle. THREE TIMES A MONTH      dexamethasone (DECADRON) 10 MG/ML injection Inject 10 mg into the vein once. For right  heel gout     ELIQUIS 5 MG TABS tablet Take 1 tablet (5 mg total) by mouth 2 (two) times daily. 180 tablet 3   famotidine (PEPCID) 20 MG tablet Take 40 mg by mouth daily.     febuxostat (ULORIC) 40 MG tablet Take 80 mg by mouth daily.     fluticasone (CUTIVATE) 0.05 % cream Apply topically as needed.     gabapentin (NEURONTIN) 100 MG capsule Take by mouth. 4 every am and 1 at bedtime     HYDROcodone-acetaminophen (NORCO) 10-325 MG tablet Take 1-2 tablets by mouth 4 (four) times daily as needed.     levothyroxine (SYNTHROID) 50 MCG tablet Take 50 mcg by mouth daily before breakfast.     lidocaine (LIDODERM) 5 % Place 1 patch onto the skin daily. Remove & Discard patch within 12 hours or as directed by MD (Patient taking differently: Place 1 patch onto the skin daily as needed. Remove & Discard patch within 12 hours or as directed by MD) 30 patch 0   magnesium oxide (MAG-OX) 400 (241.3 Mg) MG tablet Take by mouth 2 (two) times daily. 420 mg 3 in am and 3 in pm     Multiple Vitamin (MULTIVITAMIN WITH MINERALS) TABS tablet Take 1 tablet by mouth daily.     ondansetron (ZOFRAN) 4 MG tablet Take 4 mg by mouth every 8 (eight) hours as needed for nausea or vomiting.     PARoxetine (PAXIL) 40 MG tablet 20 mg daily.     rosuvastatin (CRESTOR) 10 MG tablet Take 1 tablet (10 mg total) by mouth daily. (Patient taking differently: Take 10 mg by mouth daily. Cuts 20 mg in half in am) 90 tablet 3   sodium bicarbonate 650 MG tablet Take by mouth 2 (two) times daily. 3 tabs am and 3 tabs in pm     traZODone (DESYREL) 150 MG tablet Take 1 tablet by mouth at bedtime.     No current facility-administered medications for this visit.    Physical Exam BP 127/72   Pulse 68   Resp 20   Ht 6' (1.829 m)   Wt 206 lb (93.4 kg)   SpO2 99%   BMI 27.60 kg/m  82 year old man in no acute distress Alert and oriented x3 no focal  deficits Lungs clear bilaterally Cardiac regular rate and rhythm  with faint systolic murmur  Diagnostic Tests: Official reading of CT pending  I personally reviewed the CT images.  No change in ascending aorta measuring about 4.5- 4.6 cm.  Resolved pulmonary infiltrates.  Impression: Derek [name] is a 81 year old man with a history of hypertension, paroxysmal A-fib, nonischemic cardiomyopathy, ascending aneurysm, Crohn's disease, colon cancer, remote PE and stage IV chronic kidney disease.  About 3 months ago he had a CT of the chest to follow-up an ascending aneurysm.  The aneurysm was stable but there were new infiltrates in the lungs right greater than left.  He has had a recent illness treated with antibiotics.  He now returns with a CT to ensure resolution.  Ascending aneurysm-stable at 4.6 cm.  Needs continued semiannual follow-up.  Pulmonary infiltrates-noted on CT 3 months ago.  Resolved in the interim.  Consistent with a resolved pneumonia.  Plan: Return in 6 months with CT chest to follow-up ascending aneurysm  Melrose Nakayama, MD [facility] [phone]

## 2022-04-08 ENCOUNTER — Telehealth: Payer: Self-pay | Admitting: *Deleted

## 2022-04-08 NOTE — Patient Outreach (Signed)
  Care Coordination   Initial Visit Note   04/08/2022 Name: Derek [name] MRN: [id number] DOB: 11/23/1940  Derek [name] is a 81 y.o. year old male who sees Sharilyn Sites, MD for primary care. I  spoke with wife, [name], by telephone today.  What matters to the patients health and wellness today?  Ongoing self health management    Goals Addressed             This Visit's Progress    Care Coordination Services (no follow-up required)       Care Coordination Interventions: Reviewed medications with patient and discussed affordability. Patient receives all meds free of charge from the [place]. Assessed social determinant of health barriers Assessed mobility and ability to perform ADLs Assessed family/Social support Provided patient/caregiver with verbal information on [facility] [place] [phone]) Encouraged patient to request a referral for [place] from PCP if services are needed in the future        SDOH assessments and interventions completed:  Yes  SDOH Interventions Today    Flowsheet Row Most Recent Value  SDOH Interventions   Food Insecurity Interventions Intervention Not Indicated  Housing Interventions Intervention Not Indicated  Transportation Interventions Intervention Not Indicated  Utilities Interventions Intervention Not Indicated  Financial Strain Interventions Intervention Not Indicated        Care Coordination Interventions Activated:  Yes  Care Coordination Interventions:  Yes, provided   Follow up plan: No further intervention required.   Encounter Outcome:  Pt. Visit Completed   Chong Sicilian, BSN, RN-BC [facility], community Dial: [phone]

## 2022-04-12 NOTE — Progress Notes (Unsigned)
Synopsis: Referred for dyspnea by Sharilyn Sites, MD  Subjective:   PATIENT ID: Derek [name] GENDER: male DOB: 1940/08/16, MRN: [id number]  No chief complaint on file.  81yM with history of anemia, anxiety, AF on amiodarone for 3-4 years, CAD, Crohn's, GERD, covid-19 - hospitalized in 2022, colon cancer, AAA, remote DVT, nocardia BSI 2018, remote hypothyroid referred for dyspnea  CT Chest obtained for surveillance of ascending aortic aneurysm revealed multifocal nodular opacities a couple weeks after febrile illness, cough. Seen by ID with plan for repeat CT Chest in 3 months and sputum cultures, sputum cultures never collected. At some point around that time had course of augmentin.  He says that once in a while he feels like he needs to take a deep breath. Especially when he lies down. He has had some cough, maybe some chest congestion. No fever. He plan to set up appointment for surveillance CT Chest later today.  He doesn't snore. No PND. Does take naps in evenings these days.   He has no family history of lung disease  40 py smoker quit 1977. 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 Tries to wear a mask. Doesn't feel worse from a respiratory standpoint when he's there.   Interval HPi:  Resolved nodular consolidation  PFTs   Otherwise pertinent review of systems is negative.  Past Medical History:  Diagnosis Date   Anemia    Anxiety    Aortic insufficiency    a. mild-mod by echo 09/2015.   Arthritis    "knees; left shoulder" (09/21/2013) oa   Ascending aortic aneurysm (HCC)    stable less than 5 cm per 11-21-2020 chest ct epic   Atrial fibrillation ([place])    B12 deficiency    takes Vit 12 shot every 14days    Bowel perforation ([place]) 08/27/2016   CKD (chronic kidney disease) stage 3 B, GFR 30-59 ml/min ([place]) 08/22/2011   dr patel Cassell Clement 08-26-2021   Colon cancer [facility]) 2018    surgery   Colonic ischemia ([place]) 08/27/2016   Coronary artery disease    COVID-19 03/29/2021   hospitalized from 03-29-2021 to 03-31-2021   Crohn's disease ([place])    Enteric hyperoxaluria 02/21/2016   GERD (gastroesophageal reflux disease)    Gout    takes Uloric  daily   Gout flare 09/02/2021   right heel decadron 10 mg im given   Hearing loss    Heart murmur    Hematuria    few weeks ago per pt wife pam on 09-03-2021   Hepatitis C 1978   negtive RNA load - spontaneously cleared   Hiatal hernia    High output ileostomy ([place]) 06/02/2017   History of blood transfusion 1978; 1990's; ?   "w/bowel resection; S/P allupurinol; ?" (09/21/2013)   History of colon polyps    History of dvt 2006   late 1980's earlt 1990's from jumping from plane left leg   History of kidney stones    1980's   History of MRSA infection 2010   History of small bowel obstruction    History of staph infection 1978   Hyperoxaluria    Intestinal   Hypertension    Hypothyroidism    Insomnia    takes Trazodone nightly   Internal hemorrhoids    LV dysfunction    a. h/o EF 45-50% in 2015, normalized on subsequent echoes.   Nephrolithiasis  Nocardia infection 09/2016   resolved   Pancreatitis 2010   elevated lipase and amylase, stranding in tail of pancreas, ? from Humira   Pancytopenia    Hx of   Peripheral neuropathy    takes Gabapentin daily in both legs   Pneumonia    several times last times 2012   PTSD (post-traumatic stress disorder)    takes paxil for   Pulmonary nodule    a. 11m by CT 05/2015, recommended f/u 6-12 months.   Renal tubular acidosis    RLS (restless legs syndrome)    Rosacea conjunctivitis(372.31)    Secondary hyperparathyroidism ([place] 02/21/2016   Shingles 2022   head close to eye   Short bowel syndrome    Sinus bradycardia 2018   none since   Skin cancer    "cut/burned off left ear and face" (09/21/2013)   Small bowel obstruction ([facility] 1978   Status post reversal of  ileostomy 07/23/2017   Thrombocytopenia ([place]    problems since 1978   Wears glasses reading      Family History  Problem Relation Age of Onset   Aneurysm Mother    Emphysema Mother        smoked   Kidney disease Father    Hypertension Father    Aneurysm Sister    Esophageal cancer Neg Hx    Stomach cancer Neg Hx    Rectal cancer Neg Hx    Colon cancer Neg Hx      Past Surgical History:  Procedure Laterality Date   ANKLE SURGERY Right    APPENDECTOMY  1978   BOWEL RESECTION  1978 X 2   CARDIOVERSION N/A 05/29/2016   Procedure: CARDIOVERSION;  Surgeon: MSanda Klein MD;  [facility];  Service: Cardiovascular;  Laterality: N/A;   CHOLECYSTECTOMY     yrs ago   Colon Cancer     COLON RESECTION N/A 08/14/2016   Procedure: LAPAROSCOPIC RESECTION TRANSVERSE COLON;  Surgeon: DAlphonsa Overall MD;  Location: [facility];  Service: General;  Laterality: N/A;   COLON SURGERY  2018   ileostomy   COLONOSCOPY     ESOPHAGOGASTRODUODENOSCOPY     EXTRACORPOREAL SHOCK WAVE LITHOTRIPSY     yrs ago   EYE SURGERY     cataract surgery bilateral   FOOT SURGERY Right    "took gout out"   HEMICOLECTOMY Right    ILEOCECETOMY  1978   /notes 05/10/2000  (09/21/2013)   ILEOSTOMY     ILEOSTOMY CLOSURE N/A 07/23/2017   Procedure: ILEOSTOMY REVERSAL ;  Surgeon: NAlphonsa Overall MD;  Location: [facility];  Service: General;  Laterality: N/A;   INGUINAL HERNIA REPAIR Right    IR FLUORO GUIDE CV LINE RIGHT  05/07/2017   IR REMOVAL TUN CV CATH W/O FL  10/08/2017   IR [place] ACCESS RIGHT  05/07/2017   KNEE ARTHROSCOPY Left    LAPAROTOMY N/A 08/27/2016   Procedure: EXPLORATORYLAPAROTOMY, LYSIS OF ADHESIONS, ILEOSTOMY, RIGHT COLECTOMY;  Surgeon: DAlphonsa Overall MD;  Location: [facility];  Service: General;  Laterality: N/A;   LIGAMENT REPAIR Left    POLYPECTOMY     right big toe surgery  06/2021   [place]   TEE WITHOUT CARDIOVERSION N/A 05/29/2016   Procedure: TRANSESOPHAGEAL ECHOCARDIOGRAM (TEE);   Surgeon: MSanda Klein MD;  [place] [facility]  Service: Cardiovascular;  Laterality: N/A;   TOTAL SHOULDER ARTHROPLASTY Left 09/21/2013   Procedure: LEFT TOTAL SHOULDER ARTHROPLASTY;  Surgeon: KMarin Shutter MD;  [facility] [facility]  Service: Orthopedics;  Laterality: Left;   WOUND DEBRIDEMENT N/A 09/05/2021   Procedure: CHRONIC ABDOMINAL WOUND EXPLORATION, REMOVAL OF PROLENE STITCH ABCESS;  Surgeon: Stechschulte, Nickola Major, MD;  Location: [place];  Service: General;  Laterality: N/A;    Social History   Socioeconomic History   Marital status: Married    Spouse name: [name]   Number of children: 2   Years of education: Not on file   Highest education level: Not on file  Occupational History   Occupation: Retired  Tobacco Use   Smoking status: Former    Packs/day: 2.00    Years: 20.00    Total pack years: 40.00    Types: Cigarettes    Quit date: 08/04/1975    Years since quitting: 46.7   Smokeless tobacco: Former    Types: Chew    Quit date: 08/03/1978   Tobacco comments:    09/21/2013 "quit smoking in the late 1970's; stopped chewing couple years after I quit smoking"  Vaping Use   Vaping Use: Never used  Substance and Sexual Activity   Alcohol use: No   Drug use: No   Sexual activity: Not Currently  Other Topics Concern   Not on file  Social History Narrative   Married 2 children and 6 grandchildren all local   [place] [employer]   Daily caffeine   Does not exercise regularly   Social Determinants of Health   Financial Resource Strain: Low Risk  (04/08/2022)   Overall Financial Resource Strain (CARDIA)    Difficulty of Paying Living Expenses: Not hard at all  Food Insecurity: No Food Insecurity (04/08/2022)   Hunger Vital Sign    Worried About Running Out of Food in the Last Year: Never true    [place] in the Last Year: Never true  Transportation Needs: No Transportation Needs (04/08/2022)   PRAPARE - Hydrologist  (Medical): No    Lack of Transportation (Non-Medical): No  Physical Activity: Not on file  Stress: Not on file  Social Connections: Not on file  Intimate Partner Violence: Not on file     Allergies  Allergen Reactions   Lorazepam Other (See Comments)    Reaction:  Hallucinations    Humira [Adalimumab] Other (See Comments)    Pt states that he got pancreatitis.     Ancef [Cefazolin] Hives   Colchicine     cramps   Other     Cannot have ct scans with dye due to kidney function   Quinolones     Patient was warned about not using Cipro and similar antibiotics. Recent studies have raised concern that fluoroquinolone antibiotics could be associated with an increased risk of aortic aneurysm Fluoroquinolones have non-antimicrobial properties that might jeopardise the integrity of the extracellular matrix of the vascular wall In a  propensity score matched cohort study in [place], there was a 66% increased rate of aortic aneurysm or dissection associated with oral fluoroquinolone use, compared wit     Outpatient Medications Prior to Visit  Medication Sig Dispense Refill   Acidophilus Lactobacillus CAPS Take 1 capsule by mouth daily. 90 capsule 3   amiodarone (PACERONE) 200 MG tablet Take 1 tablet (200 mg total) by mouth daily. 90 tablet 3   calcium carbonate (TUMS - DOSED IN MG ELEMENTAL CALCIUM) 500 MG chewable tablet Chew 2 tablets by mouth 2 (two) times daily.     cyanocobalamin (,VITAMIN B-12,) 1000 MCG/ML injection Inject 1,000 mcg into the  muscle. THREE TIMES A MONTH     dexamethasone (DECADRON) 10 MG/ML injection Inject 10 mg into the vein once. For right  heel gout     ELIQUIS 5 MG TABS tablet Take 1 tablet (5 mg total) by mouth 2 (two) times daily. 180 tablet 3   famotidine (PEPCID) 20 MG tablet Take 40 mg by mouth daily.     febuxostat (ULORIC) 40 MG tablet Take 80 mg by mouth daily.     fluticasone (CUTIVATE) 0.05 % cream Apply topically as needed.     gabapentin (NEURONTIN) 100  MG capsule Take by mouth. 4 every am and 1 at bedtime     HYDROcodone-acetaminophen (NORCO) 10-325 MG tablet Take 1-2 tablets by mouth 4 (four) times daily as needed.     levothyroxine (SYNTHROID) 50 MCG tablet Take 50 mcg by mouth daily before breakfast.     lidocaine (LIDODERM) 5 % Place 1 patch onto the skin daily. Remove & Discard patch within 12 hours or as directed by MD (Patient taking differently: Place 1 patch onto the skin daily as needed. Remove & Discard patch within 12 hours or as directed by MD) 30 patch 0   magnesium oxide (MAG-OX) 400 (241.3 Mg) MG tablet Take by mouth 2 (two) times daily. 420 mg 3 in am and 3 in pm     Multiple Vitamin (MULTIVITAMIN WITH MINERALS) TABS tablet Take 1 tablet by mouth daily.     ondansetron (ZOFRAN) 4 MG tablet Take 4 mg by mouth every 8 (eight) hours as needed for nausea or vomiting.     PARoxetine (PAXIL) 40 MG tablet 20 mg daily.     rosuvastatin (CRESTOR) 10 MG tablet Take 1 tablet (10 mg total) by mouth daily. (Patient taking differently: Take 10 mg by mouth daily. Cuts 20 mg in half in am) 90 tablet 3   sodium bicarbonate 650 MG tablet Take by mouth 2 (two) times daily. 3 tabs am and 3 tabs in pm     traZODone (DESYREL) 150 MG tablet Take 1 tablet by mouth at bedtime.     No facility-administered medications prior to visit.       Objective:   Physical Exam:  General appearance: 81 y.o., male, NAD, conversant  Eyes: anicteric sclerae; PERRL, tracking appropriately HENT: NCAT; MMM Neck: Trachea midline; no lymphadenopathy, no JVD Lungs: CTAB, no crackles, no wheeze, with normal respiratory effort CV: RRR, no murmur  Abdomen: Soft, non-tender; non-distended, BS present  Extremities: No peripheral edema, warm Skin: Normal turgor and texture; no rash Psych: Appropriate affect Neuro: Alert and oriented to person and place, no focal deficit     There were no vitals filed for this visit.    on RA BMI Readings from Last 3 Encounters:   03/24/22 27.94 kg/m  02/20/22 28.21 kg/m  12/03/21 28.89 kg/m   Wt Readings from Last 3 Encounters:  03/24/22 206 lb (93.4 kg)  02/20/22 208 lb (94.3 kg)  12/03/21 213 lb (96.6 kg)     CBC    Component Value Date/Time   WBC 6.6 03/31/2021 0627   RBC 3.77 (L) 03/31/2021 0627   HGB 11.4 (L) 03/31/2021 0627   HGB 13.7 12/25/2019 1042   HGB 14.1 11/23/2012 1047   HCT 35.4 (L) 03/31/2021 0627   HCT 41.9 12/25/2019 1042   HCT 41.8 11/23/2012 1047   PLT 99 (L) 03/31/2021 0627   PLT 155 12/25/2019 1042   MCV 93.9 03/31/2021 0627   MCV 92 12/25/2019 1042  MCV 86.7 11/23/2012 1047   MCH 30.2 03/31/2021 0627   MCHC 32.2 03/31/2021 0627   RDW 15.9 (H) 03/31/2021 0627   RDW 13.4 12/25/2019 1042   RDW 13.9 11/23/2012 1047   LYMPHSABS 0.3 (L) 03/31/2021 0627   LYMPHSABS 0.9 11/23/2012 1047   MONOABS 0.1 03/31/2021 0627   MONOABS 0.3 11/23/2012 1047   EOSABS 0.0 03/31/2021 0627   EOSABS 0.2 11/23/2012 1047   BASOSABS 0.0 03/31/2021 0627   BASOSABS 0.0 11/23/2012 1047      Chest Imaging: CT Chest 11/25/21 with multifocal nodular opacities centered on bronchovascular bundles, new relative to priors  Borderline lower lobe predominant traction bronchiectasis on prior CT Chests  CT Chest 03/26/22 reviewed by me with resolved areas of patchy nodular consolidation  Pulmonary Functions Testing Results:    Latest Ref Rng & Units 01/05/2017   11:54 AM 11/06/2016    2:45 PM  PFT Results  FVC-Pre L 3.64  1.36   FVC-Predicted Pre % 82  30   FVC-Post L 3.89    FVC-Predicted Post % 87    Pre FEV1/FVC % % 82  97   Post FEV1/FCV % % 81    FEV1-Pre L 2.99  1.32   FEV1-Predicted Pre % 92  40   FEV1-Post L 3.14    DLCO uncorrected ml/min/mmHg 20.30    DLCO UNC% % 60    DLCO corrected ml/min/mmHg 21.26    DLCO COR %Predicted % 63    DLVA Predicted % 85    TLC L 5.05    TLC % Predicted % 69    RV % Predicted % 52        Echocardiogram:  TTE 2021:  1. Left ventricular  ejection fraction, by visual estimation, is 60 to  65%. The left ventricle has normal function. There is no left ventricular  hypertrophy.   2. Abnormal septal motion consistent with left bundle branch block.   3. The left ventricle has no regional wall motion abnormalities.   4. Global right ventricle has normal systolic function.The right  ventricular size is normal. No increase in right ventricular wall  thickness.   5. Left atrial size was mildly dilated.   6. Right atrial size was normal.   7. The mitral valve is normal in structure. No evidence of mitral valve  regurgitation. No evidence of mitral stenosis.   8. The tricuspid valve is normal in structure.   9. The tricuspid valve is normal in structure. Tricuspid valve  regurgitation is not demonstrated.  10. The aortic valve is tricuspid. Aortic valve regurgitation is mild. No  evidence of aortic valve sclerosis or stenosis.  11. The pulmonic valve was normal in structure. Pulmonic valve  regurgitation is not visualized.  12. 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  13. The inferior vena cava is normal in size with greater than 50%  respiratory variability, suggesting right atrial pressure of 3 mmHg.  14. Aortic root 37m. Consider CTA of aorta for correlation of  measurement.  15. The average left ventricular global longitudinal strain is -18.8 %.   NM stress 10/2020: The left ventricular ejection fraction is mildly decreased (45-54%). Nuclear stress EF: 52%. There was no ST segment deviation noted during stress. There is a small defect of mild severity present in the apex location. The defect is non-reversible. In the setting of normal LVF, this is consistent with attenuation artifact. No ischemia noted. This is a low risk study.  Assessment & Plan:   # Atypical pneumonia Symptomatically doing far better than he was back in the spring. Plans for surveillance CT Chest with TCTS, ID.  #  Restrictive lung disease # Mildly reduced diffusing capacity Maybe has borderline traction bronchiectasis on prior CT Chest.  # Long term amiodarone use While he does have restriction on last PFT and mildly reduced diffusing capacity, it's not clear that he's developed related ILD from this.   Plan: - will follow up on results of upcoming CT Chest - PFTs next visit   RTC 6 weeks to review PFTs and CT Chest  Maryjane Hurter, MD [facility] 04/12/2022 2:23 PM

## 2022-04-13 ENCOUNTER — Other Ambulatory Visit: Payer: Self-pay | Admitting: Orthopaedic Surgery

## 2022-04-13 DIAGNOSIS — M542 Cervicalgia: Secondary | ICD-10-CM

## 2022-04-13 DIAGNOSIS — Z6829 Body mass index (BMI) 29.0-29.9, adult: Secondary | ICD-10-CM | POA: Diagnosis not present

## 2022-04-13 DIAGNOSIS — M47892 Other spondylosis, cervical region: Secondary | ICD-10-CM | POA: Diagnosis not present

## 2022-04-14 ENCOUNTER — Ambulatory Visit (INDEPENDENT_AMBULATORY_CARE_PROVIDER_SITE_OTHER): Payer: Medicare Other | Admitting: Student

## 2022-04-14 ENCOUNTER — Encounter: Payer: Self-pay | Admitting: Student

## 2022-04-14 VITALS — BP 120/70 | HR 64 | Ht 71.0 in | Wt 211.8 lb

## 2022-04-14 DIAGNOSIS — Z9189 Other specified personal risk factors, not elsewhere classified: Secondary | ICD-10-CM | POA: Diagnosis not present

## 2022-04-14 DIAGNOSIS — J984 Other disorders of lung: Secondary | ICD-10-CM | POA: Diagnosis not present

## 2022-04-14 DIAGNOSIS — Z79899 Other long term (current) drug therapy: Secondary | ICD-10-CM

## 2022-04-14 LAB — PULMONARY FUNCTION TEST
DL/VA % pred: 81 %
DL/VA: 3.17 ml/min/mmHg/L
DLCO cor % pred: 78 %
DLCO cor: 19.9 ml/min/mmHg
DLCO unc % pred: 78 %
DLCO unc: 19.9 ml/min/mmHg
FEF 25-75 Post: 3.33 L/sec
FEF 25-75 Pre: 2.55 L/sec
FEF2575-%Change-Post: 30 %
FEF2575-%Pred-Post: 157 %
FEF2575-%Pred-Pre: 120 %
FEV1-%Change-Post: 4 %
FEV1-%Pred-Post: 100 %
FEV1-%Pred-Pre: 95 %
FEV1-Post: 3.04 L
FEV1-Pre: 2.9 L
FEV1FVC-%Change-Post: 1 %
FEV1FVC-%Pred-Pre: 109 %
FEV6-%Change-Post: 2 %
FEV6-%Pred-Post: 95 %
FEV6-%Pred-Pre: 93 %
FEV6-Post: 3.8 L
FEV6-Pre: 3.7 L
FEV6FVC-%Change-Post: 0 %
FEV6FVC-%Pred-Post: 106 %
FEV6FVC-%Pred-Pre: 106 %
FVC-%Change-Post: 2 %
FVC-%Pred-Post: 89 %
FVC-%Pred-Pre: 87 %
FVC-Post: 3.81 L
FVC-Pre: 3.7 L
Post FEV1/FVC ratio: 80 %
Post FEV6/FVC ratio: 100 %
Pre FEV1/FVC ratio: 78 %
Pre FEV6/FVC Ratio: 100 %
RV % pred: 77 %
RV: 2.08 L
TLC % pred: 87 %
TLC: 6.36 L

## 2022-04-14 NOTE — Patient Instructions (Signed)
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

## 2022-04-14 NOTE — Progress Notes (Signed)
Full PFT Performed Today  

## 2022-04-14 NOTE — Patient Instructions (Signed)
Full PFT Performed Today  

## 2022-04-17 DIAGNOSIS — N1832 Chronic kidney disease, stage 3b: Secondary | ICD-10-CM | POA: Diagnosis not present

## 2022-04-28 DIAGNOSIS — N2581 Secondary hyperparathyroidism of renal origin: Secondary | ICD-10-CM | POA: Diagnosis not present

## 2022-04-28 DIAGNOSIS — D631 Anemia in chronic kidney disease: Secondary | ICD-10-CM | POA: Diagnosis not present

## 2022-04-28 DIAGNOSIS — N1832 Chronic kidney disease, stage 3b: Secondary | ICD-10-CM | POA: Diagnosis not present

## 2022-04-28 DIAGNOSIS — L570 Actinic keratosis: Secondary | ICD-10-CM | POA: Diagnosis not present

## 2022-04-28 DIAGNOSIS — I129 Hypertensive chronic kidney disease with stage 1 through stage 4 chronic kidney disease, or unspecified chronic kidney disease: Secondary | ICD-10-CM | POA: Diagnosis not present

## 2022-04-28 DIAGNOSIS — L821 Other seborrheic keratosis: Secondary | ICD-10-CM | POA: Diagnosis not present

## 2022-04-28 DIAGNOSIS — C44219 Basal cell carcinoma of skin of left ear and external auricular canal: Secondary | ICD-10-CM | POA: Diagnosis not present

## 2022-04-28 DIAGNOSIS — D492 Neoplasm of unspecified behavior of bone, soft tissue, and skin: Secondary | ICD-10-CM | POA: Diagnosis not present

## 2022-05-01 ENCOUNTER — Ambulatory Visit
Admission: RE | Admit: 2022-05-01 | Discharge: 2022-05-01 | Disposition: A | Discharge status: Home / Self Care | Payer: Medicare Other | Source: Ambulatory Visit | Attending: Orthopaedic Surgery | Admitting: Orthopaedic Surgery

## 2022-05-01 DIAGNOSIS — M47892 Other spondylosis, cervical region: Secondary | ICD-10-CM

## 2022-05-01 DIAGNOSIS — M4802 Spinal stenosis, cervical region: Secondary | ICD-10-CM | POA: Diagnosis not present

## 2022-05-01 DIAGNOSIS — M542 Cervicalgia: Secondary | ICD-10-CM | POA: Diagnosis not present

## 2022-05-07 DIAGNOSIS — J069 Acute upper respiratory infection, unspecified: Secondary | ICD-10-CM | POA: Diagnosis not present

## 2022-05-07 DIAGNOSIS — E663 Overweight: Secondary | ICD-10-CM | POA: Diagnosis not present

## 2022-05-07 DIAGNOSIS — Z6828 Body mass index (BMI) 28.0-28.9, adult: Secondary | ICD-10-CM | POA: Diagnosis not present

## 2022-05-28 DIAGNOSIS — M47812 Spondylosis without myelopathy or radiculopathy, cervical region: Secondary | ICD-10-CM | POA: Diagnosis not present

## 2022-05-28 DIAGNOSIS — M7918 Myalgia, other site: Secondary | ICD-10-CM | POA: Diagnosis not present

## 2022-06-04 DIAGNOSIS — C4441 Basal cell carcinoma of skin of scalp and neck: Secondary | ICD-10-CM | POA: Diagnosis not present

## 2022-06-12 DIAGNOSIS — M47812 Spondylosis without myelopathy or radiculopathy, cervical region: Secondary | ICD-10-CM | POA: Diagnosis not present

## 2022-06-17 DIAGNOSIS — M47812 Spondylosis without myelopathy or radiculopathy, cervical region: Secondary | ICD-10-CM | POA: Diagnosis not present

## 2022-06-19 DIAGNOSIS — M47812 Spondylosis without myelopathy or radiculopathy, cervical region: Secondary | ICD-10-CM | POA: Diagnosis not present

## 2022-06-22 DIAGNOSIS — J329 Chronic sinusitis, unspecified: Secondary | ICD-10-CM | POA: Diagnosis not present

## 2022-06-22 DIAGNOSIS — E663 Overweight: Secondary | ICD-10-CM | POA: Diagnosis not present

## 2022-06-22 DIAGNOSIS — J069 Acute upper respiratory infection, unspecified: Secondary | ICD-10-CM | POA: Diagnosis not present

## 2022-06-22 DIAGNOSIS — Z6828 Body mass index (BMI) 28.0-28.9, adult: Secondary | ICD-10-CM | POA: Diagnosis not present

## 2022-06-30 DIAGNOSIS — M47812 Spondylosis without myelopathy or radiculopathy, cervical region: Secondary | ICD-10-CM | POA: Diagnosis not present

## 2022-07-02 DIAGNOSIS — M47812 Spondylosis without myelopathy or radiculopathy, cervical region: Secondary | ICD-10-CM | POA: Diagnosis not present

## 2022-07-03 DIAGNOSIS — M47812 Spondylosis without myelopathy or radiculopathy, cervical region: Secondary | ICD-10-CM | POA: Diagnosis not present

## 2022-08-10 DIAGNOSIS — M47812 Spondylosis without myelopathy or radiculopathy, cervical region: Secondary | ICD-10-CM | POA: Diagnosis not present

## 2022-08-17 DIAGNOSIS — N1832 Chronic kidney disease, stage 3b: Secondary | ICD-10-CM | POA: Diagnosis not present

## 2022-08-21 DIAGNOSIS — M47812 Spondylosis without myelopathy or radiculopathy, cervical region: Secondary | ICD-10-CM | POA: Diagnosis not present

## 2022-09-03 DIAGNOSIS — D61818 Other pancytopenia: Secondary | ICD-10-CM | POA: Diagnosis not present

## 2022-09-03 DIAGNOSIS — E663 Overweight: Secondary | ICD-10-CM | POA: Diagnosis not present

## 2022-09-03 DIAGNOSIS — I77810 Thoracic aortic ectasia: Secondary | ICD-10-CM | POA: Diagnosis not present

## 2022-09-03 DIAGNOSIS — Z6828 Body mass index (BMI) 28.0-28.9, adult: Secondary | ICD-10-CM | POA: Diagnosis not present

## 2022-09-03 DIAGNOSIS — B1710 Acute hepatitis C without hepatic coma: Secondary | ICD-10-CM | POA: Diagnosis not present

## 2022-09-03 DIAGNOSIS — D6949 Other primary thrombocytopenia: Secondary | ICD-10-CM | POA: Diagnosis not present

## 2022-09-03 DIAGNOSIS — M109 Gout, unspecified: Secondary | ICD-10-CM | POA: Diagnosis not present

## 2022-09-03 DIAGNOSIS — K5180 Other ulcerative colitis without complications: Secondary | ICD-10-CM | POA: Diagnosis not present

## 2022-09-03 DIAGNOSIS — N184 Chronic kidney disease, stage 4 (severe): Secondary | ICD-10-CM | POA: Diagnosis not present

## 2022-09-18 ENCOUNTER — Encounter: Payer: Self-pay | Admitting: Internal Medicine

## 2022-09-18 ENCOUNTER — Ambulatory Visit (INDEPENDENT_AMBULATORY_CARE_PROVIDER_SITE_OTHER): Payer: No Typology Code available for payment source | Admitting: Internal Medicine

## 2022-09-18 VITALS — BP 124/70 | HR 64 | Ht 71.0 in | Wt 212.0 lb

## 2022-09-18 DIAGNOSIS — Z85038 Personal history of other malignant neoplasm of large intestine: Secondary | ICD-10-CM

## 2022-09-18 DIAGNOSIS — K5080 Crohn's disease of both small and large intestine without complications: Secondary | ICD-10-CM

## 2022-09-18 DIAGNOSIS — K219 Gastro-esophageal reflux disease without esophagitis: Secondary | ICD-10-CM

## 2022-09-18 NOTE — Patient Instructions (Signed)
_______________________________________________________  If your blood pressure at your visit was 140/90 or greater, please contact your primary care physician to follow up on this.  _______________________________________________________  If you are age 80 or older, your body mass index should be between 23-30. Your Body mass index is 29.57 kg/m. If this is out of the aforementioned range listed, please consider follow up with your Primary Care Provider.  If you are age 50 or younger, your body mass index should be between 19-25. Your Body mass index is 29.57 kg/m. If this is out of the aformentioned range listed, please consider follow up with your Primary Care Provider.   ________________________________________________________  The [facility] providers would like to encourage you to use [facility] to communicate with providers for non-urgent requests or questions.  Due to long hold times on the telephone, sending your provider a message by [facility] may be a faster and more efficient way to get a response.  Please allow 48 business hours for a response.  Please remember that this is for non-urgent requests.  _______________________________________________________  I appreciate the opportunity to care for you. Silvano Rusk, MD, [facility]

## 2022-09-18 NOTE — Progress Notes (Signed)
Derek [name] 82 y.o. [birth year] Derek [name]  Assessment & Plan:   Encounter Diagnoses  Name Primary?   Crohn's disease of both small and large intestine without complication ([place]) Yes   Personal history of colon cancer    Gastroesophageal reflux disease, unspecified whether esophagitis present    Derek [name] is doing well and we will continue in observation and he will return in 1 year for office visit.  Recall placed.  Sooner if signs or symptoms warrant.  We have not committed to repeating a colonoscopy he would rather not we will see how that goes he would be due in October 2025 if we were going to do a surveillance exam because of colon cancer.  CC: Derek Sites, MD  Subjective:   Chief Complaint: Follow-up of Crohn's disease and prior colon cancer  HPI 82 year old white man here he has a history of Crohn's disease of the small and large intestine and colon cancer status post resection in 2017.  Ever since he had that resection his recurrent small bowel obstructions have not returned.  He was to suffer from that several times a year.  Last year he had a recurrent draining abdominal wall lesion that turned out to be related to a stitch or suture.  Once that retained suture was removed that stopped.  He is not having any diarrhea or abdominal pain issues and is really happy with things are going.  He is continuing woodworking classes and building furniture and other items as a hobby.  He is followed at the [name] as well as by Dr. Hilma [name].  His hemoglobin was normal at 12.7 in September 2023.  Creatinine is 2.01 and GFR 24. Allergies  Allergen Reactions   Lorazepam Other (See Comments)    Reaction:  Hallucinations    Humira [Adalimumab] Other (See Comments)    Pt states that he got pancreatitis.     Ancef [Cefazolin] Hives   Colchicine     cramps   Other     Cannot have ct scans with dye due to kidney function   Quinolones     Patient was warned about not using Cipro and similar  antibiotics. Recent studies have raised concern that fluoroquinolone antibiotics could be associated with an increased risk of aortic aneurysm Fluoroquinolones have non-antimicrobial properties that might jeopardise the integrity of the extracellular matrix of the vascular wall In a  propensity score matched cohort study in [place], there was a 66% increased rate of aortic aneurysm or dissection associated with oral fluoroquinolone use, compared wit   Current Meds  Medication Sig   Acidophilus Lactobacillus CAPS Take 1 capsule by mouth daily.   amiodarone (PACERONE) 200 MG tablet Take 1 tablet (200 mg total) by mouth daily.   calcium carbonate (TUMS - DOSED IN MG ELEMENTAL CALCIUM) 500 MG chewable tablet Chew 2 tablets by mouth 2 (two) times daily.   cyanocobalamin (,VITAMIN B-12,) 1000 MCG/ML injection Inject 1,000 mcg into the muscle. THREE TIMES A MONTH   dexamethasone (DECADRON) 10 MG/ML injection Inject 10 mg into the vein once. For right  heel gout   ELIQUIS 5 MG TABS tablet Take 1 tablet (5 mg total) by mouth 2 (two) times daily.   famotidine (PEPCID) 20 MG tablet Take 40 mg by mouth daily.   febuxostat (ULORIC) 40 MG tablet Take 80 mg by mouth daily.   fluticasone (CUTIVATE) 0.05 % cream Apply topically as needed.   gabapentin (NEURONTIN) 100 MG capsule Take by mouth. 4 every am and  1 at bedtime   HYDROcodone-acetaminophen (NORCO) 10-325 MG tablet Take 1-2 tablets by mouth 4 (four) times daily as needed.   levothyroxine (SYNTHROID) 50 MCG tablet Take 50 mcg by mouth daily before breakfast.   lidocaine (LIDODERM) 5 % Place 1 patch onto the skin daily. Remove & Discard patch within 12 hours or as directed by MD (Patient taking differently: Place 1 patch onto the skin daily as needed. Remove & Discard patch within 12 hours or as directed by MD)   magnesium oxide (MAG-OX) 400 (241.3 Mg) MG tablet Take by mouth 2 (two) times daily. 420 mg 3 in am and 3 in pm   Multiple Vitamin (MULTIVITAMIN  WITH MINERALS) TABS tablet Take 1 tablet by mouth daily.   ondansetron (ZOFRAN) 4 MG tablet Take 4 mg by mouth every 8 (eight) hours as needed for nausea or vomiting.   PARoxetine (PAXIL) 40 MG tablet 20 mg daily.   rosuvastatin (CRESTOR) 10 MG tablet Take 1 tablet (10 mg total) by mouth daily. (Patient taking differently: Take 10 mg by mouth daily. Cuts 20 mg in half in am)   sodium bicarbonate 650 MG tablet Take by mouth 2 (two) times daily. 3 tabs am and 3 tabs in pm   traZODone (DESYREL) 150 MG tablet Take 1 tablet by mouth at bedtime.   Past Medical History:  Diagnosis Date   Anemia    Anxiety    Aortic insufficiency    a. mild-mod by echo 09/2015.   Arthritis    "knees; left shoulder" (09/21/2013) oa   Ascending aortic aneurysm ([name])    stable less than 5 cm per 11-21-2020 chest ct epic   Atrial fibrillation ([name])    B12 deficiency    takes Vit 12 shot every 14days    Bowel perforation ([name]) 08/27/2016   CKD (chronic kidney disease) stage 3 B, GFR 30-59 ml/min ([name]) 08/22/2011   dr patel Cassell Clement 08-26-2021   Colon cancer [name]) 2018   surgery   Colonic ischemia ([name]) 08/27/2016   Coronary artery disease    COVID-19 03/29/2021   hospitalized from 03-29-2021 to 03-31-2021   Crohn's disease ([name])    Enteric hyperoxaluria 02/21/2016   GERD (gastroesophageal reflux disease)    Gout    takes Uloric  daily   Gout flare 09/02/2021   right heel decadron 10 mg im given   Hearing loss    Heart murmur    Hematuria    few weeks ago per pt wife pam on 09-03-2021   Hepatitis C 1978   negtive RNA load - spontaneously cleared   Hiatal hernia    High output ileostomy ([name]) 06/02/2017   History of blood transfusion 1978; 1990's; ?   "w/bowel resection; S/P allupurinol; ?" (09/21/2013)   History of colon polyps    History of dvt 2006   late 1980's earlt 1990's from jumping from plane left leg   History of kidney stones    1980's   History of MRSA infection 2010   History of small bowel  obstruction    History of staph infection 1978   Hyperoxaluria    Intestinal   Hypertension    Hypothyroidism    Insomnia    takes Trazodone nightly   Internal hemorrhoids    LV dysfunction    a. h/o EF 45-50% in 2015, normalized on subsequent echoes.   Nephrolithiasis    Nocardia infection 09/2016   resolved   Pancreatitis 2010   elevated lipase and amylase, stranding in  tail of pancreas, ? from Humira   Pancytopenia    Hx of   Peripheral neuropathy    takes Gabapentin daily in both legs   Pneumonia    several times last times 2012   PTSD (post-traumatic stress disorder)    takes paxil for   Pulmonary nodule    a. 2m by CT 05/2015, recommended f/u 6-12 months.   Renal tubular acidosis    RLS (restless legs syndrome)    Rosacea conjunctivitis(372.31)    Secondary hyperparathyroidism ([facility] 02/21/2016   Shingles 2022   head close to eye   Short bowel syndrome    Sinus bradycardia 2018   none since   Skin cancer    "cut/burned off left ear and face" (09/21/2013)   Small bowel obstruction ([place] 1978   Status post reversal of ileostomy 07/23/2017   Thrombocytopenia ([facility]    problems since 1978   Wears glasses reading    Past Surgical History:  Procedure Laterality Date   ANKLE SURGERY Right    [place]   CARDIOVERSION N/A 05/29/2016   Procedure: CARDIOVERSION;  Surgeon: MSanda Klein MD;  [facility];  Service: Cardiovascular;  Laterality: N/A;   CHOLECYSTECTOMY     yrs ago   Colon Cancer     COLON RESECTION N/A 08/14/2016   Procedure: LAPAROSCOPIC RESECTION TRANSVERSE COLON;  Surgeon: DAlphonsa Overall MD;  Location: [facility];  Service: General;  Laterality: N/A;   COLON SURGERY  2018   ileostomy   COLONOSCOPY     ESOPHAGOGASTRODUODENOSCOPY     EXTRACORPOREAL SHOCK WAVE LITHOTRIPSY     yrs ago   EYE SURGERY     cataract surgery bilateral   FOOT SURGERY Right    "took gout out"   HEMICOLECTOMY Right     ILEOCECETOMY  1978   /[name]10/03/2000  (09/21/2013)   ILEOSTOMY     ILEOSTOMY CLOSURE N/A 07/23/2017   Procedure: ILEOSTOMY REVERSAL ;  Surgeon: NAlphonsa Overall MD;  [facility]: [facility];  Service: General;  Laterality: N/A;   INGUINAL HERNIA REPAIR Right    IR FLUORO GUIDE CV LINE RIGHT  05/07/2017   IR REMOVAL TUN CV CATH W/O FL  10/08/2017   IR [place] ACCESS RIGHT  05/07/2017   KNEE ARTHROSCOPY Left    LAPAROTOMY N/A 08/27/2016   Procedure: EXPLORATORYLAPAROTOMY, LYSIS OF ADHESIONS, ILEOSTOMY, RIGHT COLECTOMY;  Surgeon: DAlphonsa Overall MD;  Location: [facility];  Service: General;  Laterality: N/A;   LIGAMENT REPAIR Left    POLYPECTOMY     right big toe surgery  06/2021   [place]   TEE WITHOUT CARDIOVERSION N/A 05/29/2016   Procedure: TRANSESOPHAGEAL ECHOCARDIOGRAM (TEE);  Surgeon: MSanda Klein MD;  [facility]  Service: Cardiovascular;  Laterality: N/A;   TOTAL SHOULDER ARTHROPLASTY Left 09/21/2013   Procedure: LEFT TOTAL SHOULDER ARTHROPLASTY;  Surgeon: KMarin Shutter MD;  [facility]  Service: Orthopedics;  Laterality: Left;   WOUND DEBRIDEMENT N/A 09/05/2021   Procedure: CHRONIC ABDOMINAL WOUND EXPLORATION, REMOVAL OF PROLENE STITCH ABCESS;  Surgeon: Stechschulte, PNickola Major MD;  [facility]  Service: General;  Laterality: N/A;   Social History   Social History Narrative   Married 2 children and 6 grandchildren all local   [place] [employer]   Daily caffeine   Does not exercise regularly   family history includes Aneurysm in his mother and sister; Emphysema in his mother; Hypertension in his father;  Kidney disease in his father.   Review of Systems As above  Objective:   Physical Exam @BP$  124/70   Pulse 64   Ht 5' 11"$  (1.803 m)   Wt 212 lb (96.2 kg)   BMI 29.57 kg/m @  General:  NAD Eyes:   anicteric Lungs:  Clear but diffusely decreased BS Heart::  S1S2 no rubs, murmurs or gallops Abdomen:  soft and nontender, BS+ - numerous  scars from multiple surgeries    Data Reviewed:  See HPI

## 2022-10-15 DIAGNOSIS — D492 Neoplasm of unspecified behavior of bone, soft tissue, and skin: Secondary | ICD-10-CM | POA: Diagnosis not present

## 2022-10-15 DIAGNOSIS — L821 Other seborrheic keratosis: Secondary | ICD-10-CM | POA: Diagnosis not present

## 2022-10-15 DIAGNOSIS — L02821 Furuncle of head [any part, except face]: Secondary | ICD-10-CM | POA: Diagnosis not present

## 2022-10-15 DIAGNOSIS — L570 Actinic keratosis: Secondary | ICD-10-CM | POA: Diagnosis not present

## 2022-11-11 DIAGNOSIS — L02821 Furuncle of head [any part, except face]: Secondary | ICD-10-CM | POA: Diagnosis not present

## 2022-11-16 ENCOUNTER — Other Ambulatory Visit: Payer: Self-pay | Admitting: Thoracic Surgery (Cardiothoracic Vascular Surgery)

## 2022-11-16 DIAGNOSIS — I7121 Aneurysm of the ascending aorta, without rupture: Secondary | ICD-10-CM

## 2022-11-16 DIAGNOSIS — I712 Thoracic aortic aneurysm, without rupture, unspecified: Secondary | ICD-10-CM

## 2022-12-02 DIAGNOSIS — L0202 Furuncle of face: Secondary | ICD-10-CM | POA: Diagnosis not present

## 2022-12-02 DIAGNOSIS — L814 Other melanin hyperpigmentation: Secondary | ICD-10-CM | POA: Diagnosis not present

## 2022-12-02 DIAGNOSIS — L02821 Furuncle of head [any part, except face]: Secondary | ICD-10-CM | POA: Diagnosis not present

## 2022-12-02 DIAGNOSIS — L570 Actinic keratosis: Secondary | ICD-10-CM | POA: Diagnosis not present

## 2022-12-02 DIAGNOSIS — D225 Melanocytic nevi of trunk: Secondary | ICD-10-CM | POA: Diagnosis not present

## 2022-12-02 DIAGNOSIS — L821 Other seborrheic keratosis: Secondary | ICD-10-CM | POA: Diagnosis not present

## 2022-12-07 ENCOUNTER — Ambulatory Visit
Admission: RE | Admit: 2022-12-07 | Discharge: 2022-12-07 | Disposition: A | Discharge status: Home / Self Care | Payer: Medicare PPO | Source: Ambulatory Visit | Attending: Thoracic Surgery (Cardiothoracic Vascular Surgery) | Admitting: Thoracic Surgery (Cardiothoracic Vascular Surgery)

## 2022-12-07 ENCOUNTER — Other Ambulatory Visit: Payer: Self-pay | Admitting: Thoracic Surgery (Cardiothoracic Vascular Surgery)

## 2022-12-07 DIAGNOSIS — I7121 Aneurysm of the ascending aorta, without rupture: Secondary | ICD-10-CM | POA: Diagnosis not present

## 2022-12-07 DIAGNOSIS — I719 Aortic aneurysm of unspecified site, without rupture: Secondary | ICD-10-CM | POA: Diagnosis not present

## 2022-12-07 DIAGNOSIS — I712 Thoracic aortic aneurysm, without rupture, unspecified: Secondary | ICD-10-CM

## 2022-12-07 DIAGNOSIS — Z87891 Personal history of nicotine dependence: Secondary | ICD-10-CM | POA: Diagnosis not present

## 2022-12-07 DIAGNOSIS — J439 Emphysema, unspecified: Secondary | ICD-10-CM | POA: Diagnosis not present

## 2022-12-08 ENCOUNTER — Encounter: Payer: Self-pay | Admitting: Thoracic Surgery (Cardiothoracic Vascular Surgery)

## 2022-12-08 ENCOUNTER — Ambulatory Visit (INDEPENDENT_AMBULATORY_CARE_PROVIDER_SITE_OTHER): Payer: Medicare PPO | Admitting: Thoracic Surgery (Cardiothoracic Vascular Surgery)

## 2022-12-08 VITALS — BP 150/84 | HR 76 | Resp 20 | Wt 215.0 lb

## 2022-12-08 DIAGNOSIS — I7121 Aneurysm of the ascending aorta, without rupture: Secondary | ICD-10-CM

## 2022-12-08 NOTE — Progress Notes (Signed)
[address]       [name] [id number]             [phone]     HPI: Derek [name] returns for follow-up of his ascending aneurysm.  Derek [name] is an 82 year old man with a history of hypertension, paroxysmal atrial fibrillation, nonischemic cardiomyopathy, ascending aortic aneurysm, Crohn's disease, colon cancer, remote PE, and stage IV chronic kidney disease.  He has been followed for an ascending aneurysm by Dr. Tyrone Sage.  After he retired I began following Derek [name].  I last saw him in August 2023.  The aneurysm measured 4.6 cm.  He has been feeling reasonably well.  His primary complaint is back and hip pain.  No chest pain, pressure, or tightness.  No shortness of breath with normal activities.  Past Medical History:  Diagnosis Date   Anemia    Anxiety    Aortic insufficiency    a. mild-mod by echo 09/2015.   Arthritis    "knees; left shoulder" (09/21/2013) oa   Ascending aortic aneurysm ([facility])    stable less than 5 cm per 11-21-2020 chest ct epic   Atrial fibrillation ([facility])    B12 deficiency    takes Vit 12 shot every 14days    Bowel perforation ([facility]) 08/27/2016   CKD (chronic kidney disease) stage 3 B, GFR 30-59 ml/min ([facility]) 08/22/2011   dr patel Theron Arista 08-26-2021   Colon cancer [facility]) 2018   surgery   Colonic ischemia ([facility]) 08/27/2016   Coronary artery disease    COVID-19 03/29/2021   hospitalized from 03-29-2021 to 03-31-2021   Crohn's disease ([facility])    Enteric hyperoxaluria 02/21/2016   GERD (gastroesophageal reflux disease)    Gout    takes Uloric  daily   Gout flare 09/02/2021   right heel decadron 10 mg im given   Hearing loss    Heart murmur    Hematuria    few weeks ago per pt wife pam on 09-03-2021   Hepatitis C 1978   negtive RNA load - spontaneously cleared   Hiatal hernia    High output ileostomy ([facility]) 06/02/2017   History of blood transfusion 1978; 1990's; ?   "w/bowel resection; S/P allupurinol; ?" (09/21/2013)    History of colon polyps    History of dvt 2006   late 1980's earlt 1990's from jumping from plane left leg   History of kidney stones    1980's   History of MRSA infection 2010   History of small bowel obstruction    History of staph infection 1978   Hyperoxaluria    Intestinal   Hypertension    Hypothyroidism    Insomnia    takes Trazodone nightly   Internal hemorrhoids    LV dysfunction    a. h/o EF 45-50% in 2015, normalized on subsequent echoes.   Nephrolithiasis    Nocardia infection 09/2016   resolved   Pancreatitis 2010   elevated lipase and amylase, stranding in tail of pancreas, ? from Humira   Pancytopenia    Hx of   Peripheral neuropathy    takes Gabapentin daily in both legs   Pneumonia    several times last times 2012   PTSD (post-traumatic stress disorder)    takes paxil for   Pulmonary nodule    a. 6mm by CT 05/2015, recommended f/u 6-12 months.   Renal tubular acidosis    RLS (restless legs syndrome)    Rosacea conjunctivitis(372.31)  Secondary hyperparathyroidism ([facility]) 02/21/2016   Shingles 2022   head close to eye   Short bowel syndrome    Sinus bradycardia 2018   none since   Skin cancer    "cut/burned off left ear and face" (09/21/2013)   Small bowel obstruction ([facility]) 1978   Status post reversal of ileostomy 07/23/2017   Thrombocytopenia ([facility])    problems since 1978   Wears glasses reading     Current Outpatient Medications  Medication Sig Dispense Refill   Acidophilus Lactobacillus CAPS Take 1 capsule by mouth daily. 90 capsule 3   amiodarone (PACERONE) 200 MG tablet Take 1 tablet (200 mg total) by mouth daily. 90 tablet 3   calcium carbonate (TUMS - DOSED IN MG ELEMENTAL CALCIUM) 500 MG chewable tablet Chew 2 tablets by mouth 2 (two) times daily.     cyanocobalamin (,VITAMIN B-12,) 1000 MCG/ML injection Inject 1,000 mcg into the muscle. THREE TIMES A MONTH     dexamethasone (DECADRON) 10 MG/ML injection Inject 10 mg into the vein once.  For right  heel gout     ELIQUIS 5 MG TABS tablet Take 1 tablet (5 mg total) by mouth 2 (two) times daily. 180 tablet 3   famotidine (PEPCID) 20 MG tablet Take 40 mg by mouth daily.     febuxostat (ULORIC) 40 MG tablet Take 80 mg by mouth daily.     fluticasone (CUTIVATE) 0.05 % cream Apply topically as needed.     gabapentin (NEURONTIN) 100 MG capsule Take by mouth. 4 every am and 1 at bedtime     HYDROcodone-acetaminophen (NORCO) 10-325 MG tablet Take 1-2 tablets by mouth 4 (four) times daily as needed.     levothyroxine (SYNTHROID) 50 MCG tablet Take 50 mcg by mouth daily before breakfast.     lidocaine (LIDODERM) 5 % Place 1 patch onto the skin daily. Remove & Discard patch within 12 hours or as directed by MD (Patient taking differently: Place 1 patch onto the skin daily as needed. Remove & Discard patch within 12 hours or as directed by MD) 30 patch 0   magnesium oxide (MAG-OX) 400 (241.3 Mg) MG tablet Take by mouth 2 (two) times daily. 420 mg 3 in am and 3 in pm     Multiple Vitamin (MULTIVITAMIN WITH MINERALS) TABS tablet Take 1 tablet by mouth daily.     ondansetron (ZOFRAN) 4 MG tablet Take 4 mg by mouth every 8 (eight) hours as needed for nausea or vomiting.     PARoxetine (PAXIL) 40 MG tablet 20 mg daily.     rosuvastatin (CRESTOR) 10 MG tablet Take 1 tablet (10 mg total) by mouth daily. (Patient taking differently: Take 10 mg by mouth daily. Cuts 20 mg in half in am) 90 tablet 3   sodium bicarbonate 650 MG tablet Take by mouth 2 (two) times daily. 3 tabs am and 3 tabs in pm     traZODone (DESYREL) 150 MG tablet Take 1 tablet by mouth at bedtime.     No current facility-administered medications for this visit.    Physical Exam BP (!) 150/84   Pulse 76   Resp 20   Wt 215 lb (97.5 kg)   SpO2 95%   BMI 29.88 kg/m  82 year old man in no acute distress Alert and oriented x 3 with no focal deficits Lungs clear bilaterally No carotid bruits Cardiac regular rate and rhythm with  normal S1 and S2, faint systolic murmur No peripheral edema  Diagnostic Tests:  I  personally reviewed the CT images.  No change in the ascending aortic aneurysm measuring about 4.6 cm.  No change in 5 mm left lower lobe lung nodule. Official reading pending.  Impression: Derek [name] is an 82 year old man with a history of hypertension, paroxysmal atrial fibrillation, nonischemic cardiomyopathy, ascending aortic aneurysm, Crohn's disease, colon cancer, remote PE, and stage IV chronic kidney disease.  Ascending aneurysm-stable at 4.6 cm.  Needs continued semiannual follow-up.  Hypertension-blood pressure elevated at 150/84 today.  He typically is well-controlled on his current regimen.  He says he has been compliant with medications.  He will follow-up with his primary.   Plan: Return in 6 months with CT chest, no contrast, to follow-up ascending aneurysm.  I spent over 20 minutes in review of records, images, and in consultation with Derek [name] today. Loreli Slot, MD [facility] [phone]

## 2022-12-31 DIAGNOSIS — E663 Overweight: Secondary | ICD-10-CM | POA: Diagnosis not present

## 2022-12-31 DIAGNOSIS — Z6829 Body mass index (BMI) 29.0-29.9, adult: Secondary | ICD-10-CM | POA: Diagnosis not present

## 2022-12-31 DIAGNOSIS — S43402A Unspecified sprain of left shoulder joint, initial encounter: Secondary | ICD-10-CM | POA: Diagnosis not present

## 2023-01-06 DIAGNOSIS — J069 Acute upper respiratory infection, unspecified: Secondary | ICD-10-CM | POA: Diagnosis not present

## 2023-03-02 ENCOUNTER — Other Ambulatory Visit (HOSPITAL_COMMUNITY): Payer: Self-pay

## 2023-03-02 DIAGNOSIS — J8410 Pulmonary fibrosis, unspecified: Secondary | ICD-10-CM

## 2023-03-16 ENCOUNTER — Encounter (HOSPITAL_COMMUNITY): Payer: Self-pay

## 2023-03-16 ENCOUNTER — Ambulatory Visit (HOSPITAL_COMMUNITY): Admission: RE | Admit: 2023-03-16 | Payer: No Typology Code available for payment source | Source: Ambulatory Visit

## 2023-04-12 DIAGNOSIS — J069 Acute upper respiratory infection, unspecified: Secondary | ICD-10-CM | POA: Diagnosis not present

## 2023-04-12 DIAGNOSIS — Z20828 Contact with and (suspected) exposure to other viral communicable diseases: Secondary | ICD-10-CM | POA: Diagnosis not present

## 2023-04-12 DIAGNOSIS — E663 Overweight: Secondary | ICD-10-CM | POA: Diagnosis not present

## 2023-04-12 DIAGNOSIS — Z6826 Body mass index (BMI) 26.0-26.9, adult: Secondary | ICD-10-CM | POA: Diagnosis not present

## 2023-05-03 DIAGNOSIS — L821 Other seborrheic keratosis: Secondary | ICD-10-CM | POA: Diagnosis not present

## 2023-05-03 DIAGNOSIS — L570 Actinic keratosis: Secondary | ICD-10-CM | POA: Diagnosis not present

## 2023-05-03 DIAGNOSIS — L814 Other melanin hyperpigmentation: Secondary | ICD-10-CM | POA: Diagnosis not present

## 2023-05-03 DIAGNOSIS — L710 Perioral dermatitis: Secondary | ICD-10-CM | POA: Diagnosis not present

## 2023-05-03 DIAGNOSIS — D225 Melanocytic nevi of trunk: Secondary | ICD-10-CM | POA: Diagnosis not present

## 2023-05-06 ENCOUNTER — Telehealth: Payer: Self-pay | Admitting: Internal Medicine

## 2023-05-06 ENCOUNTER — Ambulatory Visit: Payer: Medicare PPO | Admitting: Physician Assistant

## 2023-05-06 ENCOUNTER — Encounter (HOSPITAL_COMMUNITY): Payer: Self-pay | Admitting: *Deleted

## 2023-05-06 ENCOUNTER — Inpatient Hospital Stay (HOSPITAL_COMMUNITY)
Admission: EM | Admit: 2023-05-06 | Discharge: 2023-05-09 | DRG: 378 | Disposition: A | Discharge status: Home / Self Care | Payer: No Typology Code available for payment source | Attending: Internal Medicine | Admitting: Internal Medicine

## 2023-05-06 ENCOUNTER — Other Ambulatory Visit: Payer: Self-pay

## 2023-05-06 DIAGNOSIS — Z79899 Other long term (current) drug therapy: Secondary | ICD-10-CM | POA: Diagnosis not present

## 2023-05-06 DIAGNOSIS — Z825 Family history of asthma and other chronic lower respiratory diseases: Secondary | ICD-10-CM

## 2023-05-06 DIAGNOSIS — Z841 Family history of disorders of kidney and ureter: Secondary | ICD-10-CM

## 2023-05-06 DIAGNOSIS — E039 Hypothyroidism, unspecified: Secondary | ICD-10-CM | POA: Diagnosis present

## 2023-05-06 DIAGNOSIS — Z8616 Personal history of COVID-19: Secondary | ICD-10-CM | POA: Diagnosis not present

## 2023-05-06 DIAGNOSIS — E8809 Other disorders of plasma-protein metabolism, not elsewhere classified: Secondary | ICD-10-CM | POA: Diagnosis present

## 2023-05-06 DIAGNOSIS — Z8614 Personal history of Methicillin resistant Staphylococcus aureus infection: Secondary | ICD-10-CM

## 2023-05-06 DIAGNOSIS — Z7901 Long term (current) use of anticoagulants: Secondary | ICD-10-CM

## 2023-05-06 DIAGNOSIS — I2510 Atherosclerotic heart disease of native coronary artery without angina pectoris: Secondary | ICD-10-CM | POA: Diagnosis present

## 2023-05-06 DIAGNOSIS — R001 Bradycardia, unspecified: Secondary | ICD-10-CM | POA: Diagnosis present

## 2023-05-06 DIAGNOSIS — F419 Anxiety disorder, unspecified: Secondary | ICD-10-CM | POA: Diagnosis present

## 2023-05-06 DIAGNOSIS — E875 Hyperkalemia: Secondary | ICD-10-CM | POA: Diagnosis present

## 2023-05-06 DIAGNOSIS — K921 Melena: Secondary | ICD-10-CM | POA: Diagnosis not present

## 2023-05-06 DIAGNOSIS — Z888 Allergy status to other drugs, medicaments and biological substances status: Secondary | ICD-10-CM

## 2023-05-06 DIAGNOSIS — I10 Essential (primary) hypertension: Secondary | ICD-10-CM | POA: Diagnosis not present

## 2023-05-06 DIAGNOSIS — G629 Polyneuropathy, unspecified: Secondary | ICD-10-CM | POA: Diagnosis present

## 2023-05-06 DIAGNOSIS — I129 Hypertensive chronic kidney disease with stage 1 through stage 4 chronic kidney disease, or unspecified chronic kidney disease: Secondary | ICD-10-CM | POA: Diagnosis present

## 2023-05-06 DIAGNOSIS — D649 Anemia, unspecified: Secondary | ICD-10-CM

## 2023-05-06 DIAGNOSIS — K264 Chronic or unspecified duodenal ulcer with hemorrhage: Principal | ICD-10-CM | POA: Diagnosis present

## 2023-05-06 DIAGNOSIS — Z713 Dietary counseling and surveillance: Secondary | ICD-10-CM | POA: Diagnosis not present

## 2023-05-06 DIAGNOSIS — I480 Paroxysmal atrial fibrillation: Secondary | ICD-10-CM | POA: Diagnosis not present

## 2023-05-06 DIAGNOSIS — Z6827 Body mass index (BMI) 27.0-27.9, adult: Secondary | ICD-10-CM

## 2023-05-06 DIAGNOSIS — Z85828 Personal history of other malignant neoplasm of skin: Secondary | ICD-10-CM

## 2023-05-06 DIAGNOSIS — I447 Left bundle-branch block, unspecified: Secondary | ICD-10-CM | POA: Diagnosis present

## 2023-05-06 DIAGNOSIS — E785 Hyperlipidemia, unspecified: Secondary | ICD-10-CM | POA: Diagnosis present

## 2023-05-06 DIAGNOSIS — G2581 Restless legs syndrome: Secondary | ICD-10-CM | POA: Diagnosis present

## 2023-05-06 DIAGNOSIS — R030 Elevated blood-pressure reading, without diagnosis of hypertension: Secondary | ICD-10-CM | POA: Diagnosis present

## 2023-05-06 DIAGNOSIS — H5702 Anisocoria: Secondary | ICD-10-CM | POA: Diagnosis present

## 2023-05-06 DIAGNOSIS — N1832 Chronic kidney disease, stage 3b: Secondary | ICD-10-CM | POA: Diagnosis not present

## 2023-05-06 DIAGNOSIS — Z96612 Presence of left artificial shoulder joint: Secondary | ICD-10-CM | POA: Diagnosis present

## 2023-05-06 DIAGNOSIS — N184 Chronic kidney disease, stage 4 (severe): Secondary | ICD-10-CM | POA: Diagnosis present

## 2023-05-06 DIAGNOSIS — D696 Thrombocytopenia, unspecified: Secondary | ICD-10-CM | POA: Diagnosis present

## 2023-05-06 DIAGNOSIS — Z86718 Personal history of other venous thrombosis and embolism: Secondary | ICD-10-CM | POA: Diagnosis not present

## 2023-05-06 DIAGNOSIS — I4891 Unspecified atrial fibrillation: Secondary | ICD-10-CM | POA: Diagnosis present

## 2023-05-06 DIAGNOSIS — Z85038 Personal history of other malignant neoplasm of large intestine: Secondary | ICD-10-CM

## 2023-05-06 DIAGNOSIS — D61818 Other pancytopenia: Secondary | ICD-10-CM | POA: Diagnosis present

## 2023-05-06 DIAGNOSIS — E663 Overweight: Secondary | ICD-10-CM | POA: Diagnosis present

## 2023-05-06 DIAGNOSIS — I7121 Aneurysm of the ascending aorta, without rupture: Secondary | ICD-10-CM | POA: Diagnosis present

## 2023-05-06 DIAGNOSIS — Z87891 Personal history of nicotine dependence: Secondary | ICD-10-CM

## 2023-05-06 DIAGNOSIS — Z79890 Hormone replacement therapy (postmenopausal): Secondary | ICD-10-CM | POA: Diagnosis not present

## 2023-05-06 DIAGNOSIS — K269 Duodenal ulcer, unspecified as acute or chronic, without hemorrhage or perforation: Secondary | ICD-10-CM

## 2023-05-06 DIAGNOSIS — D62 Acute posthemorrhagic anemia: Secondary | ICD-10-CM | POA: Diagnosis not present

## 2023-05-06 DIAGNOSIS — Z8619 Personal history of other infectious and parasitic diseases: Secondary | ICD-10-CM

## 2023-05-06 DIAGNOSIS — Z8249 Family history of ischemic heart disease and other diseases of the circulatory system: Secondary | ICD-10-CM | POA: Diagnosis not present

## 2023-05-06 DIAGNOSIS — K219 Gastro-esophageal reflux disease without esophagitis: Secondary | ICD-10-CM | POA: Diagnosis not present

## 2023-05-06 DIAGNOSIS — Z860100 Personal history of colon polyps, unspecified: Secondary | ICD-10-CM

## 2023-05-06 DIAGNOSIS — Z87442 Personal history of urinary calculi: Secondary | ICD-10-CM

## 2023-05-06 DIAGNOSIS — Z9049 Acquired absence of other specified parts of digestive tract: Secondary | ICD-10-CM

## 2023-05-06 LAB — COMPREHENSIVE METABOLIC PANEL
ALT: 25 U/L (ref 0–44)
AST: 29 U/L (ref 15–41)
Albumin: 3.3 g/dL — ABNORMAL LOW (ref 3.5–5.0)
Alkaline Phosphatase: 102 U/L (ref 38–126)
Anion gap: 5 (ref 5–15)
BUN: 33 mg/dL — ABNORMAL HIGH (ref 8–23)
CO2: 22 mmol/L (ref 22–32)
Calcium: 8.3 mg/dL — ABNORMAL LOW (ref 8.9–10.3)
Chloride: 113 mmol/L — ABNORMAL HIGH (ref 98–111)
Creatinine, Ser: 2.11 mg/dL — ABNORMAL HIGH (ref 0.61–1.24)
GFR, Estimated: 31 mL/min — ABNORMAL LOW (ref >60)
Glucose, Bld: 98 mg/dL (ref 70–99)
Potassium: 5.3 mmol/L — ABNORMAL HIGH (ref 3.5–5.1)
Sodium: 140 mmol/L (ref 135–145)
Total Bilirubin: 0.2 mg/dL — ABNORMAL LOW (ref 0.3–1.2)
Total Protein: 5.8 g/dL — ABNORMAL LOW (ref 6.5–8.1)

## 2023-05-06 LAB — CBC
HCT: 30.5 % — ABNORMAL LOW (ref 39.0–52.0)
Hemoglobin: 9.4 g/dL — ABNORMAL LOW (ref 13.0–17.0)
MCH: 29.8 pg (ref 26.0–34.0)
MCHC: 30.8 g/dL (ref 30.0–36.0)
MCV: 96.8 fL (ref 80.0–100.0)
Platelets: 145 10*3/uL — ABNORMAL LOW (ref 150–400)
RBC: 3.15 MIL/uL — ABNORMAL LOW (ref 4.22–5.81)
RDW: 15.2 % (ref 11.5–15.5)
WBC: 4.4 10*3/uL (ref 4.0–10.5)
nRBC: 0 % (ref 0.0–0.2)

## 2023-05-06 LAB — HEMOGLOBIN AND HEMATOCRIT, BLOOD
HCT: 26.2 % — ABNORMAL LOW (ref 39.0–52.0)
Hemoglobin: 8.1 g/dL — ABNORMAL LOW (ref 13.0–17.0)

## 2023-05-06 LAB — PROTIME-INR
INR: 1.2 (ref 0.8–1.2)
Prothrombin Time: 15.3 s — ABNORMAL HIGH (ref 11.4–15.2)

## 2023-05-06 MED ORDER — SODIUM CHLORIDE 0.9 % IV SOLN: INTRAVENOUS | Status: DC

## 2023-05-06 MED ORDER — ONDANSETRON HCL 4 MG/2ML IJ SOLN
4.0000 mg | Freq: Four times a day (QID) | INTRAMUSCULAR | Status: DC | PRN
  Administered 2023-05-07: 4 mg via INTRAVENOUS
  Filled 2023-05-06: qty 2

## 2023-05-06 MED ORDER — ORAL CARE MOUTH RINSE: 15.0000 mL | OROMUCOSAL | Status: DC | PRN

## 2023-05-06 MED ORDER — ACETAMINOPHEN 325 MG PO TABS: 650.0000 mg | ORAL_TABLET | Freq: Four times a day (QID) | ORAL | Status: DC | PRN

## 2023-05-06 MED ORDER — ONDANSETRON HCL 4 MG PO TABS
4.0000 mg | ORAL_TABLET | Freq: Four times a day (QID) | ORAL | Status: DC | PRN
  Administered 2023-05-08: 4 mg via ORAL
  Filled 2023-05-06: qty 1

## 2023-05-06 MED ORDER — MELATONIN 3 MG PO TABS
3.0000 mg | ORAL_TABLET | Freq: Once | ORAL | Status: AC
  Administered 2023-05-06: 3 mg via ORAL
  Filled 2023-05-06: qty 1

## 2023-05-06 MED ORDER — PANTOPRAZOLE SODIUM 40 MG IV SOLR: 40.0000 mg | Freq: Two times a day (BID) | INTRAVENOUS | Status: DC

## 2023-05-06 MED ORDER — LEVOTHYROXINE SODIUM 25 MCG PO TABS
25.0000 ug | ORAL_TABLET | Freq: Every day | ORAL | Status: DC
  Administered 2023-05-08 – 2023-05-09 (×2): 25 ug via ORAL
  Filled 2023-05-06 (×2): qty 1

## 2023-05-06 MED ORDER — AMIODARONE HCL 200 MG PO TABS
200.0000 mg | ORAL_TABLET | Freq: Every day | ORAL | Status: DC
  Administered 2023-05-07 – 2023-05-09 (×3): 200 mg via ORAL
  Filled 2023-05-06 (×3): qty 1

## 2023-05-06 MED ORDER — TRAZODONE HCL 50 MG PO TABS
150.0000 mg | ORAL_TABLET | Freq: Every day | ORAL | Status: DC
  Administered 2023-05-06 – 2023-05-08 (×3): 150 mg via ORAL
  Filled 2023-05-06 (×3): qty 1

## 2023-05-06 MED ORDER — ACETAMINOPHEN 650 MG RE SUPP: 650.0000 mg | Freq: Four times a day (QID) | RECTAL | Status: DC | PRN

## 2023-05-06 MED ORDER — ROSUVASTATIN CALCIUM 5 MG PO TABS
5.0000 mg | ORAL_TABLET | Freq: Every day | ORAL | Status: DC
  Administered 2023-05-07 – 2023-05-09 (×3): 5 mg via ORAL
  Filled 2023-05-06 (×3): qty 1

## 2023-05-06 MED ORDER — SODIUM BICARBONATE 650 MG PO TABS
1950.0000 mg | ORAL_TABLET | Freq: Two times a day (BID) | ORAL | Status: DC
  Administered 2023-05-06 – 2023-05-09 (×6): 1950 mg via ORAL
  Filled 2023-05-06 (×7): qty 3

## 2023-05-06 MED ORDER — SODIUM CHLORIDE 0.9 % IV BOLUS
500.0000 mL | Freq: Once | INTRAVENOUS | Status: AC
  Administered 2023-05-06: 500 mL via INTRAVENOUS

## 2023-05-06 MED ORDER — FEBUXOSTAT 40 MG PO TABS
80.0000 mg | ORAL_TABLET | Freq: Every day | ORAL | Status: DC
  Administered 2023-05-07 – 2023-05-09 (×2): 80 mg via ORAL
  Filled 2023-05-06 (×3): qty 2

## 2023-05-06 MED ORDER — PANTOPRAZOLE SODIUM 40 MG IV SOLR: 40.0000 mg | Freq: Once | INTRAVENOUS | Status: DC

## 2023-05-06 MED ORDER — PANTOPRAZOLE INFUSION (NEW) - SIMPLE MED
8.0000 mg/h | INTRAVENOUS | Status: DC
  Administered 2023-05-06 – 2023-05-08 (×4): 8 mg/h via INTRAVENOUS
  Filled 2023-05-06: qty 80
  Filled 2023-05-06: qty 100
  Filled 2023-05-06 (×3): qty 80
  Filled 2023-05-06: qty 100

## 2023-05-06 MED ORDER — PANTOPRAZOLE SODIUM 40 MG IV SOLR
40.0000 mg | Freq: Once | INTRAVENOUS | Status: AC
  Administered 2023-05-06: 40 mg via INTRAVENOUS
  Filled 2023-05-06: qty 10

## 2023-05-06 MED ORDER — PAROXETINE HCL 20 MG PO TABS
20.0000 mg | ORAL_TABLET | Freq: Every day | ORAL | Status: DC
  Administered 2023-05-07 – 2023-05-09 (×3): 20 mg via ORAL
  Filled 2023-05-06 (×3): qty 1

## 2023-05-06 NOTE — Telephone Encounter (Signed)
Correction I think he should be evaluated in the emergency department given all of these problems.  He could be having gastrointestinal bleeding and need admission.

## 2023-05-06 NOTE — Telephone Encounter (Signed)
Spoke with wife regarding MD recommendations to go to ED. They plan to go. MD updated.

## 2023-05-06 NOTE — ED Triage Notes (Signed)
Sent from GI due to [facility] tarry stools for last 2 days

## 2023-05-06 NOTE — Telephone Encounter (Signed)
I suggest he come see Victorino Dike at 3 PM she has an opening.

## 2023-05-06 NOTE — Telephone Encounter (Signed)
Patient's wife called in stating patient started to have black non-tarry stools, nausea, chills, and weakness since yesterday. She's unsure of recent temp. He typically has a loose bm 4-5/day which is his normal. He has gone to the store to pick up hemoccult cards. Denies any pain. No recent pepto use. Patient was last seen for OV with Dr. Leone Payor 09/2022. She is concerned d/t his history of Crohns.

## 2023-05-06 NOTE — ED Notes (Signed)
ED TO INPATIENT HANDOFF REPORT  Name/Age/Gender Derek [name] 82 y.o. male  Code Status    Code Status Orders  (From admission, onward)           Start     Ordered   05/06/23 1836  Full code  Continuous       Question:  By:  Answer:  Consent: discussion documented in EHR   05/06/23 1835           Code Status History     Date Active Date Inactive Code Status Order ID Comments User Context   03/29/2021 0615 03/31/2021 2031 Full Code [id number]  Frankey Shown, DO ED   03/12/2019 1855 03/14/2019 1949 Full Code [id number]  Teddy Spike, DO ED   07/23/2017 1417 07/31/2017 1517 Full Code [id number]  Ovidio Kin, MD Inpatient   05/09/2017 0838 05/13/2017 1611 Full Code [id number]  Eston Esters, MD ED   08/28/2016 0034 09/14/2016 1743 Full Code [id number]  Ovidio Kin, MD Inpatient   08/14/2016 1320 08/21/2016 1657 Full Code [id number]  Ovidio Kin, MD Inpatient   05/26/2016 1437 06/02/2016 1722 Full Code [id number]  Calvert Cantor, MD Inpatient   05/12/2016 0117 05/14/2016 1551 Full Code [id number]  Alberteen Sam, MD Inpatient   09/21/2013 1121 09/22/2013 1510 Full Code [id number]  Ralene Bathe, PA-C Inpatient      Advance Directive Documentation    Flowsheet Row Most Recent Value  Type of Advance Directive Healthcare Power of Attorney, Living will  Pre-existing out of facility DNR order (yellow form or pink MOST form) --  "MOST" Form in Place? --       Home/SNF/Other Home  Chief Complaint Gastrointestinal hemorrhage with melena [K92.1]  Level of Care/Admitting Diagnosis ED Disposition     ED Disposition  Admit   Condition  --   Comment  Hospital Area: [facility] [[id number]]  Level of Care: Progressive [102]  Admit to Progressive based on following criteria: GI, ENDOCRINE disease patients with GI bleeding, acute liver failure or pancreatitis, stable with diabetic ketoacidosis or thyrotoxicosis (hypothyroid) state.  May admit patient  to [facility] or [facility] if equivalent level of care is available:: No  Covid Evaluation: Asymptomatic - no recent exposure (last 10 days) testing not required  Diagnosis: Gastrointestinal hemorrhage with melena [[id number]]  Admitting Physician: Bobette Mo [[id number]]  Attending Physician: Bobette Mo [[id number]]  Certification:: I certify this patient will need inpatient services for at least 2 midnights  Expected Medical Readiness: 05/08/2023          Medical History Past Medical History:  Diagnosis Date   Anemia    Anxiety    Aortic insufficiency    a. mild-mod by echo 09/2015.   Arthritis    "knees; left shoulder" (09/21/2013) oa   Ascending aortic aneurysm (HCC)    stable less than 5 cm per 11-21-2020 chest ct epic   Atrial fibrillation (HCC)    B12 deficiency    takes Vit 12 shot every 14days    Bowel perforation (HCC) 08/27/2016   CKD (chronic kidney disease) stage 3 B, GFR 30-59 ml/min (HCC) 08/22/2011   dr patel Theron Arista 08-26-2021   Colon cancer [facility]) 2018   surgery   Colonic ischemia (HCC) 08/27/2016   Coronary artery disease    COVID-19 03/29/2021   hospitalized from 03-29-2021 to 03-31-2021   Crohn's disease (HCC)    Enteric hyperoxaluria 02/21/2016   GERD (gastroesophageal reflux disease)    Gout    takes  Uloric  daily   Gout flare 09/02/2021   right heel decadron 10 mg im given   Hearing loss    Heart murmur    Hematuria    few weeks ago per pt wife pam on 09-03-2021   Hepatitis C 1978   negtive RNA load - spontaneously cleared   Hiatal hernia    High output ileostomy (HCC) 06/02/2017   History of blood transfusion 1978; 1990's; ?   "w/bowel resection; S/P allupurinol; ?" (09/21/2013)   History of colon polyps    History of dvt 2006   late 1980's earlt 1990's from jumping from plane left leg   History of kidney stones    1980's   History of MRSA infection 2010   History of small bowel obstruction    History of staph infection 1978    Hyperoxaluria    Intestinal   Hypertension    Hypothyroidism    Insomnia    takes Trazodone nightly   Internal hemorrhoids    LV dysfunction    a. h/o EF 45-50% in 2015, normalized on subsequent echoes.   Nephrolithiasis    Nocardia infection 09/2016   resolved   Pancreatitis 2010   elevated lipase and amylase, stranding in tail of pancreas, ? from Humira   Pancytopenia    Hx of   Peripheral neuropathy    takes Gabapentin daily in both legs   Pneumonia    several times last times 2012   PTSD (post-traumatic stress disorder)    takes paxil for   Pulmonary nodule    a. 6mm by CT 05/2015, recommended f/u 6-12 months.   Renal tubular acidosis    RLS (restless legs syndrome)    Rosacea conjunctivitis(372.31)    Secondary hyperparathyroidism (HCC) 02/21/2016   Shingles 2022   head close to eye   Short bowel syndrome    Sinus bradycardia 2018   none since   Skin cancer    "cut/burned off left ear and face" (09/21/2013)   Small bowel obstruction (HCC) 1978   Status post reversal of ileostomy 07/23/2017   Thrombocytopenia (HCC)    problems since 1978   Wears glasses reading     Allergies Allergies  Allergen Reactions   Lorazepam Other (See Comments)    Hallucinations    Other Other (See Comments)    Cannot have ct scans with dye due to kidney function   Humira [Adalimumab] Other (See Comments)    Pt states that he got pancreatitis because of this    Colchicine Other (See Comments)    Cramps    Quinolones Other (See Comments)    Patient was warned about not using Cipro and similar antibiotics. Recent studies have raised concern that fluoroquinolone antibiotics could be associated with an increased risk of aortic aneurysm Fluoroquinolones have non-antimicrobial properties that might jeopardise the integrity of the extracellular matrix of the vascular wall In a  propensity score matched cohort study in [place], there was a 66% increased rate of aortic aneurysm or  dissection associated with oral fluoroquinolone use, compared wit    IV Location/Drains/Wounds Patient Lines/Drains/Airways Status     Active Line/Drains/Airways     Name Placement date Placement time Site Days   Peripheral IV 05/06/23 20 G 1.88" Right Antecubital 05/06/23  1514  Antecubital  less than 1   Open Drain 1 Medial Abdomen 0.3 Fr. 09/05/21  0911  Abdomen  608            Labs/Imaging Results for orders placed  or performed during the hospital encounter of 05/06/23 (from the past 48 hour(s))  Type and screen [facility]     Status: None   Collection Time: 05/06/23  2:01 PM  Result Value Ref Range   ABO/RH(D) O POS    Antibody Screen NEG    Sample Expiration      05/09/2023,2359 Performed at [facility], [address] [place], [place] [address]   Comprehensive metabolic panel     Status: Abnormal   Collection Time: 05/06/23  2:03 PM  Result Value Ref Range   Sodium 140 135 - 145 mmol/L   Potassium 5.3 (H) 3.5 - 5.1 mmol/L   Chloride 113 (H) 98 - 111 mmol/L   CO2 22 22 - 32 mmol/L   Glucose, Bld 98 70 - 99 mg/dL    Comment: Glucose reference range applies only to samples taken after fasting for at least 8 hours.   BUN 33 (H) 8 - 23 mg/dL   Creatinine, Ser 6.04 (H) 0.61 - 1.24 mg/dL   Calcium 8.3 (L) 8.9 - 10.3 mg/dL   Total Protein 5.8 (L) 6.5 - 8.1 g/dL   Albumin 3.3 (L) 3.5 - 5.0 g/dL   AST 29 15 - 41 U/L   ALT 25 0 - 44 U/L   Alkaline Phosphatase 102 38 - 126 U/L   Total Bilirubin 0.2 (L) 0.3 - 1.2 mg/dL   GFR, Estimated 31 (L) >60 mL/min    Comment: (NOTE) Calculated using the CKD-EPI Creatinine Equation (2021)    Anion gap 5 5 - 15    Comment: Performed at [facility], [address], [place], [place] [address]  CBC     Status: Abnormal   Collection Time: 05/06/23  2:03 PM  Result Value Ref Range   WBC 4.4 4.0 - 10.5 K/uL   RBC 3.15 (L) 4.22 - 5.81 MIL/uL   Hemoglobin 9.4 (L) 13.0 - 17.0  g/dL   HCT 11.9 (L) 14.7 - 82.9 %   MCV 96.8 80.0 - 100.0 fL   MCH 29.8 26.0 - 34.0 pg   MCHC 30.8 30.0 - 36.0 g/dL   RDW 56.2 13.0 - 86.5 %   Platelets 145 (L) 150 - 400 K/uL   nRBC 0.0 0.0 - 0.2 %    Comment: Performed at [facility], [address] [place], [place] [address]  Protime-INR     Status: Abnormal   Collection Time: 05/06/23  3:15 PM  Result Value Ref Range   Prothrombin Time 15.3 (H) 11.4 - 15.2 seconds   INR 1.2 0.8 - 1.2    Comment: (NOTE) INR goal varies based on device and disease states. Performed at [facility], [address], [place], [place] [address]    No results found.  Pending Labs Unresulted Labs (From admission, onward)     Start     Ordered   05/07/23 0500  CBC  Tomorrow morning,   R        05/06/23 1835   05/07/23 0500  Comprehensive metabolic panel  Tomorrow morning,   R        05/06/23 1835   05/06/23 2300  Hemoglobin and hematocrit, blood  Once-Timed,   TIMED        05/06/23 1855   05/06/23 1856  Hemoglobin and hematocrit, blood  ONCE - STAT,   STAT        05/06/23 1855  Vitals/Pain Today's Vitals   05/06/23 1745 05/06/23 1800 05/06/23 1815 05/06/23 1830  BP: 117/61 119/63 116/69 134/69  Pulse: (!) 52 (!) 55 (!) 51 (!) 54  Resp:    16  Temp:      TempSrc:      SpO2: 100% 100% 100% 100%  Weight:      Height:      PainSc:        Isolation Precautions No active isolations  Medications Medications  levothyroxine (SYNTHROID) tablet 25 mcg (has no administration in time range)  rosuvastatin (CRESTOR) tablet 5 mg (has no administration in time range)  traZODone (DESYREL) tablet 150 mg (has no administration in time range)  PARoxetine (PAXIL) tablet 20 mg (has no administration in time range)  acetaminophen (TYLENOL) tablet 650 mg (has no administration in time range)    Or  acetaminophen (TYLENOL) suppository 650 mg (has no administration in time range)  ondansetron  (ZOFRAN) tablet 4 mg (has no administration in time range)    Or  ondansetron (ZOFRAN) injection 4 mg (has no administration in time range)  pantoprozole (PROTONIX) 80 mg /NS 100 mL infusion (8 mg/hr Intravenous New Bag/Given 05/06/23 2030)  pantoprazole (PROTONIX) injection 40 mg (has no administration in time range)  pantoprazole (PROTONIX) injection 40 mg (has no administration in time range)  0.9 %  sodium chloride infusion (has no administration in time range)  amiodarone (PACERONE) tablet 200 mg (has no administration in time range)  febuxostat (ULORIC) tablet 80 mg (has no administration in time range)  sodium bicarbonate tablet 1,950 mg (has no administration in time range)  pantoprazole (PROTONIX) injection 40 mg (40 mg Intravenous Given 05/06/23 1542)  sodium chloride 0.9 % bolus 500 mL (500 mLs Intravenous New Bag/Given 05/06/23 2008)    Mobility walks with person assist
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
## 2023-05-06 NOTE — Telephone Encounter (Signed)
PT has had black stools since yesterday and is concerned. Please advise.

## 2023-05-06 NOTE — ED Provider Notes (Signed)
[facility] AT [facility] Provider Note   CSN: [id number] Arrival date & time: 05/06/23  1344     History  Chief Complaint  Patient presents with   GI Bleeding    Derek [name] is a 82 y.o. male.  82 year old male with past medical history of colon cancer s/p resection who is in remission as well as atrial fibrillation on Eliquis and hyperlipidemia presenting to the emergency department today with melena.  The patient states that he has been having melanotic stools now over the past 2 to 3 days.  He reports that he has been feeling fatigue and lightheadedness with this.  He denies any associated chest pain.  He does have a history of GERD as well.  Denies any known history of ulcers.  He does not drink any alcohol.  He states that the first day he noticed some maroon-colored stools but since then he has been having dark black stools.  He called and talked to his gastroenterologist        Home Medications Prior to Admission medications   Medication Sig Start Date End Date Taking? Authorizing Provider  Acidophilus Lactobacillus CAPS Take 1 capsule by mouth daily. 06/08/16   Iva Boop, MD  amiodarone (PACERONE) 200 MG tablet Take 1 tablet (200 mg total) by mouth daily. 10/30/20   Jake Bathe, MD  calcium carbonate (TUMS - DOSED IN MG ELEMENTAL CALCIUM) 500 MG chewable tablet Chew 2 tablets by mouth 2 (two) times daily.    [provider]  cyanocobalamin (,VITAMIN B-12,) 1000 MCG/ML injection Inject 1,000 mcg into the muscle. THREE TIMES A MONTH    [provider]  dexamethasone (DECADRON) 10 MG/ML injection Inject 10 mg into the vein once. For right  heel gout    Assunta Found, MD  ELIQUIS 5 MG TABS tablet Take 1 tablet (5 mg total) by mouth 2 (two) times daily. 11/05/16   End, Cristal Deer, MD  famotidine (PEPCID) 20 MG tablet Take 40 mg by mouth daily.    [provider]  febuxostat (ULORIC) 40 MG tablet Take 80 mg  by mouth daily.    [provider]  fluticasone (CUTIVATE) 0.05 % cream Apply topically as needed. 02/24/21   [provider]  gabapentin (NEURONTIN) 100 MG capsule Take by mouth. 4 every am and 1 at bedtime    [provider]  HYDROcodone-acetaminophen (NORCO) 10-325 MG tablet Take 1-2 tablets by mouth 4 (four) times daily as needed. 12/05/19   [provider]  levothyroxine (SYNTHROID) 50 MCG tablet Take 50 mcg by mouth daily before breakfast.    [provider]  lidocaine (LIDODERM) 5 % Place 1 patch onto the skin daily. Remove & Discard patch within 12 hours or as directed by MD Patient taking differently: Place 1 patch onto the skin daily as needed. Remove & Discard patch within 12 hours or as directed by MD 04/02/20   Arthor Captain, PA-C  magnesium oxide (MAG-OX) 400 (241.3 Mg) MG tablet Take by mouth 2 (two) times daily. 420 mg 3 in am and 3 in pm    [provider]  Multiple Vitamin (MULTIVITAMIN WITH MINERALS) TABS tablet Take 1 tablet by mouth daily.    [provider]  ondansetron (ZOFRAN) 4 MG tablet Take 4 mg by mouth every 8 (eight) hours as needed for nausea or vomiting.    Assunta Found, MD  PARoxetine (PAXIL) 40 MG tablet 20 mg daily. 05/07/21   [provider]  rosuvastatin (CRESTOR) 10 MG tablet Take 1 tablet (10 mg total) by mouth daily. Patient taking differently: Take 10 mg by mouth daily. Cuts 20 mg in half in am 10/17/20   Jake Bathe, MD  sodium bicarbonate 650 MG tablet Take by mouth 2 (two) times daily. 3 tabs am and 3 tabs in pm    [provider]  traZODone (DESYREL) 150 MG tablet Take 1 tablet by mouth at bedtime. 05/07/21   [provider]  Calcium Carbonate (CALCIUM 500 PO) Take 2 tablets by mouth 2 (two) times daily.   05/11/16  [provider]      Allergies    Lorazepam, Humira [adalimumab], Ancef [cefazolin], Colchicine, Other, and Quinolones    Review of Systems    Review of Systems  Gastrointestinal:  Positive for blood in stool. Negative for abdominal distention, abdominal pain, diarrhea, nausea, rectal pain and vomiting.  All other systems reviewed and are negative.   Physical Exam Updated Vital Signs BP 128/65   Pulse 63   Temp 97.6 F (36.4 C) (Oral)   Resp 17   Ht 5\' 10"  (1.778 m)   Wt 87.1 kg   SpO2 100%   BMI 27.55 kg/m  Physical Exam Gen: NAD Eyes: PERRL, EOMI HEENT: no oropharyngeal swelling Neck: trachea midline Resp: clear to auscultation bilaterally Card: RRR, no murmurs, rubs, or gallops Abd: nontender, nondistended Extremities: no calf tenderness, no edema Vascular: 2+ radial pulses bilaterally, 2+ DP pulses bilaterally Skin: no rashes Psyc: acting appropriately  ED Results / Procedures / Treatments   Labs (all labs ordered are listed, but only abnormal results are displayed) Labs Reviewed  COMPREHENSIVE METABOLIC PANEL - Abnormal; Notable for the following components:      Result Value   Potassium 5.3 (*)    Chloride 113 (*)    BUN 33 (*)    Creatinine, Ser 2.11 (*)    Calcium 8.3 (*)    Total Protein 5.8 (*)    Albumin 3.3 (*)    Total Bilirubin 0.2 (*)    GFR, Estimated 31 (*)    All other components within normal limits  CBC - Abnormal; Notable for the following components:   RBC 3.15 (*)    Hemoglobin 9.4 (*)    HCT 30.5 (*)    Platelets 145 (*)    All other components within normal limits  PROTIME-INR - Abnormal; Notable for the following components:   Prothrombin Time 15.3 (*)    All other components within normal limits  POC OCCULT BLOOD, ED  TYPE AND SCREEN    EKG None  Radiology No results found.  Procedures Procedures    Medications Ordered in ED Medications  pantoprazole (PROTONIX) injection 40 mg (40 mg Intravenous Given 05/06/23 1542)    ED Course/ Medical Decision Making/ A&P                                 Medical Decision Making 82 year old male with past medical  history of colon cancer s/p resection in remission as well as hyperlipidemia and hypothyroidism as well as atrial fibrillation on Eliquis presenting to the emergency department today with symptoms concerning for GI bleeding.  I will obtain basic labs here to evaluate for anemia.  Will obtain an INR as well.  Will obtain a type and screen in the event the patient requires transfusion.  After his initial labs,, discussed his case  with his gastroenterologist for admission.  The patient's hemoglobin did drop 2 g.  The patient otherwise remained stable here.  I did discuss case with hospitalist service.  They will admit him for further evaluation.  I did discuss this with gastroenterology on-call who recommends twice daily PPI.  Also recommend n.p.o. after midnight for potential endoscopy tomorrow.  He will be admitted for further evaluation management.  Amount and/or Complexity of Data Reviewed Labs: ordered.  Risk Prescription drug management. Decision regarding hospitalization.           Final Clinical Impression(s) / ED Diagnoses Final diagnoses:  Melena  Anemia, unspecified type    Rx / DC Orders ED Discharge Orders     None         Durwin Glaze, MD 05/06/23 1704

## 2023-05-07 DIAGNOSIS — I480 Paroxysmal atrial fibrillation: Secondary | ICD-10-CM

## 2023-05-07 DIAGNOSIS — Z7901 Long term (current) use of anticoagulants: Secondary | ICD-10-CM | POA: Diagnosis not present

## 2023-05-07 DIAGNOSIS — N1832 Chronic kidney disease, stage 3b: Secondary | ICD-10-CM

## 2023-05-07 DIAGNOSIS — I10 Essential (primary) hypertension: Secondary | ICD-10-CM

## 2023-05-07 DIAGNOSIS — D62 Acute posthemorrhagic anemia: Secondary | ICD-10-CM | POA: Diagnosis not present

## 2023-05-07 DIAGNOSIS — K921 Melena: Secondary | ICD-10-CM | POA: Diagnosis not present

## 2023-05-07 DIAGNOSIS — E039 Hypothyroidism, unspecified: Secondary | ICD-10-CM

## 2023-05-07 DIAGNOSIS — I2510 Atherosclerotic heart disease of native coronary artery without angina pectoris: Secondary | ICD-10-CM | POA: Diagnosis not present

## 2023-05-07 DIAGNOSIS — D696 Thrombocytopenia, unspecified: Secondary | ICD-10-CM

## 2023-05-07 LAB — IRON AND TIBC
Iron: 79 ug/dL (ref 45–182)
Saturation Ratios: 17 % — ABNORMAL LOW (ref 17.9–39.5)
TIBC: 458 ug/dL — ABNORMAL HIGH (ref 250–450)
UIBC: 379 ug/dL

## 2023-05-07 LAB — CBC
HCT: 25.2 % — ABNORMAL LOW (ref 39.0–52.0)
Hemoglobin: 7.9 g/dL — ABNORMAL LOW (ref 13.0–17.0)
MCH: 29.8 pg (ref 26.0–34.0)
MCHC: 31.3 g/dL (ref 30.0–36.0)
MCV: 95.1 fL (ref 80.0–100.0)
Platelets: 108 10*3/uL — ABNORMAL LOW (ref 150–400)
RBC: 2.65 MIL/uL — ABNORMAL LOW (ref 4.22–5.81)
RDW: 15.4 % (ref 11.5–15.5)
WBC: 3.8 10*3/uL — ABNORMAL LOW (ref 4.0–10.5)
nRBC: 0 % (ref 0.0–0.2)

## 2023-05-07 LAB — COMPREHENSIVE METABOLIC PANEL
ALT: 40 U/L (ref 0–44)
AST: 50 U/L — ABNORMAL HIGH (ref 15–41)
Albumin: 3.1 g/dL — ABNORMAL LOW (ref 3.5–5.0)
Alkaline Phosphatase: 95 U/L (ref 38–126)
Anion gap: 7 (ref 5–15)
BUN: 30 mg/dL — ABNORMAL HIGH (ref 8–23)
CO2: 24 mmol/L (ref 22–32)
Calcium: 8.3 mg/dL — ABNORMAL LOW (ref 8.9–10.3)
Chloride: 108 mmol/L (ref 98–111)
Creatinine, Ser: 2.15 mg/dL — ABNORMAL HIGH (ref 0.61–1.24)
GFR, Estimated: 30 mL/min — ABNORMAL LOW (ref >60)
Glucose, Bld: 107 mg/dL — ABNORMAL HIGH (ref 70–99)
Potassium: 5.8 mmol/L — ABNORMAL HIGH (ref 3.5–5.1)
Sodium: 139 mmol/L (ref 135–145)
Total Bilirubin: 0.8 mg/dL (ref 0.3–1.2)
Total Protein: 5.2 g/dL — ABNORMAL LOW (ref 6.5–8.1)

## 2023-05-07 LAB — FOLATE: Folate: 35.7 ng/mL (ref >5.9)

## 2023-05-07 LAB — VITAMIN B12: Vitamin B-12: 89 pg/mL — ABNORMAL LOW (ref 180–914)

## 2023-05-07 LAB — RETICULOCYTES
Immature Retic Fract: 24.4 % — ABNORMAL HIGH (ref 2.3–15.9)
RBC.: 2.64 MIL/uL — ABNORMAL LOW (ref 4.22–5.81)
Retic Count, Absolute: 80.3 10*3/uL (ref 19.0–186.0)
Retic Ct Pct: 3 % (ref 0.4–3.1)

## 2023-05-07 LAB — FERRITIN: Ferritin: 28 ng/mL (ref 24–336)

## 2023-05-07 LAB — HEMOGLOBIN AND HEMATOCRIT, BLOOD
HCT: 26.7 % — ABNORMAL LOW (ref 39.0–52.0)
Hemoglobin: 8.3 g/dL — ABNORMAL LOW (ref 13.0–17.0)

## 2023-05-07 MED ORDER — SODIUM CHLORIDE 0.9 % IV SOLN: INTRAVENOUS | Status: DC

## 2023-05-07 MED ORDER — SODIUM ZIRCONIUM CYCLOSILICATE 10 G PO PACK
10.0000 g | PACK | Freq: Once | ORAL | Status: AC
  Administered 2023-05-07: 10 g via ORAL
  Filled 2023-05-07: qty 1

## 2023-05-07 MED ORDER — CYANOCOBALAMIN 1000 MCG/ML IJ SOLN
1000.0000 ug | Freq: Once | INTRAMUSCULAR | Status: AC
  Administered 2023-05-08: 1000 ug via INTRAMUSCULAR
  Filled 2023-05-07 (×2): qty 1

## 2023-05-07 MED ORDER — MELATONIN 3 MG PO TABS
6.0000 mg | ORAL_TABLET | Freq: Every evening | ORAL | Status: DC | PRN
  Administered 2023-05-07 – 2023-05-08 (×2): 6 mg via ORAL
  Filled 2023-05-07 (×3): qty 2
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
## 2023-05-07 NOTE — Plan of Care (Signed)
  Problem: Elimination: Goal: Will not experience complications related to bowel motility Outcome: Progressing   Problem: Safety: Goal: Ability to remain free from injury will improve Outcome: Progressing   Problem: Skin Integrity: Goal: Risk for impaired skin integrity will decrease Outcome: Progressing   

## 2023-05-07 NOTE — Hospital Course (Addendum)
The patient is a 82 year old Caucasian male with a past medical history significant for but limited to anemia, anxiety, aortic insufficiency, aortic ascending aneurysm, proximal atrial fibrillation on anticoagulant with Eliquis, heart murmur, hypertension, left ventricular dysfunction, CAD, osteoarthritis, B12 deficiency, history of colon cancer, history of Crohn's disease, history of bowel perforation, history of small bowel obstruction, colonic ischemia, history of pancreatitis, history of GERD, history of high output ileostomy and ileostomy reversal, stage IIIb kidney disease as well as other comorbidities who presented to the ED with multiple episodes of melena associate with dizziness, mild dyspnea and generalized weakness.  He initially had been made n.p.o. and GI been consulted and they are planning EGD and now they are planning it for 05/08/2023 so he will be on a clear liquid diet.  His anticoagulation has been held.  Currently getting very low-dose maintenance IV fluids at 50 mL/h  Assessment and Plan:  ABLA in the setting of Melena with associated Dizziness, Weakness and Slight Dypsnea -Baseline hemoglobin is around 10-11 and patient admits to fatigue and he is on anticoagulation with Eliquis -Hgb/Hct Trend: Recent Labs  Lab 05/06/23 1403 05/06/23 2327 05/07/23 0449 05/07/23 1534  HGB 9.4* 8.1* 7.9* 8.3*  HCT 30.5* 26.2* 25.2* 26.7*  MCV 96.8  --  95.1  --   -Checked Anemia Panel i and it showed an iron level of 79, UIBC 371, TIBC 458, saturation ratios of 17%, ferritin level 20, folate level is 35.7 and vitamin B12 89 -Will start vitamin B12 supplementation with 1000 mcg -continue to hold Eliquis -He was initiated on a pantoprazole drip -Continue to Monitor for S/Sx of Bleeding; -He is on a clear liquid diet and will be recommended for n.p.o. at midnight for EGD in the morning for possible biopsies and possible esophageal dilatation -Repeat CBC int he AM -Plan is for EGD on  05/08/2023  CORONARY ATHEROSCLEROSIS NATIVE CORONARY ARTERY -Continue statin. -Hold anticoagulation. -Monitor for any CP   Thrombocytopenia (HCC) -Platelet Count Trend: Recent Labs  Lab 05/06/23 1403 05/07/23 0449  PLT 145* 108*  -Continue to Monitor and Trend -Repeat CBC in the AM   Paroxysmal atrial fibrillation (HCC) -Hold DOAC. -Continue Amiodarone 200 mg p.o. twice daily. -Continue to Monitor on Telemetry   Essential hypertension -Hold antihypertensives due to blood loss. -Continue to Monitor BP per Protocol   Hypothyroidism -Continue LLevothyroxine 25 mcg p.o. daily.  Generalized weakness -In the setting of his acute blood loss anemia -Monitor for further blood loss and he is on gentle IV fluid hydration at 50 cc/h -Obtain PT OT to further evaluate and treat in the a.m.  CKD Stage 3b -BUN/Cr Trend: Recent Labs  Lab 05/06/23 1403 05/07/23 0449  BUN 33* 30*  CREATININE 2.11* 2.15*  -Currently getting IVF with NS at 50 mL/hr -C/w Sodium Bicarbonate 1950 mg po BID -Avoid Nephrotoxic Medications, Contrast Dyes, Hypotension and Dehydration to Ensure Adequate Renal Perfusion and will need to Renally Adjust Meds -Continue to Monitor and Trend Renal Function carefully and repeat CMP in the AM   HLD -C/w Rosuvastatin 5 mg po Daily  Pancytopenia -CBC Trend: Recent Labs  Lab 05/06/23 1403 05/06/23 2327 05/07/23 0449 05/07/23 1534  WBC 4.4  --  3.8*  --   HGB 9.4*   < > 7.9* 8.3*  HCT 30.5*   < > 25.2* 26.7*  MCV 96.8  --  95.1  --   PLT 145*  --  108*  --    < > = values in this  interval not displayed.  -Continue to Monitor and Trend and repeat CBC in the AM  Hypoalbuminemia -Patient's Albumin Trend: Recent Labs  Lab 05/06/23 1403 05/07/23 0449  ALBUMIN 3.3* 3.1*  -Continue to Monitor and Trend and repeat CMP in the AM  Overweight -Complicates overall prognosis and care -Estimated body mass index is 27.55 kg/m as calculated from the following:    Height as of this encounter: 5\' 10"  (1.778 m).   Weight as of this encounter: 87.1 kg.  -Weight Loss and Dietary Counseling given

## 2023-05-07 NOTE — Progress Notes (Signed)
PROGRESS NOTE    Derek [name]  ZOX:[id number] DOB: 02-09-1941 DOA: 05/06/2023 PCP: Assunta Found, MD   Brief Narrative:  The patient is a 82 year old Caucasian male with a past medical history significant for but limited to anemia, anxiety, aortic insufficiency, aortic ascending aneurysm, proximal atrial fibrillation on anticoagulant with Eliquis, heart murmur, hypertension, left ventricular dysfunction, CAD, osteoarthritis, B12 deficiency, history of colon cancer, history of Crohn's disease, history of bowel perforation, history of small bowel obstruction, colonic ischemia, history of pancreatitis, history of GERD, history of high output ileostomy and ileostomy reversal, stage IIIb kidney disease as well as other comorbidities who presented to the ED with multiple episodes of melena associate with dizziness, mild dyspnea and generalized weakness.  He initially had been made n.p.o. and GI been consulted and they are planning EGD and now they are planning it for 05/08/2023 so he will be on a clear liquid diet.  His anticoagulation has been held.  Currently getting very low-dose maintenance IV fluids at 50 mL/h  Assessment and Plan:  ABLA in the setting of Melena with associated Dizziness, Weakness and Slight Dypsnea -Baseline hemoglobin is around 10-11 and patient admits to fatigue and he is on anticoagulation with Eliquis -Hgb/Hct Trend: Recent Labs  Lab 05/06/23 1403 05/06/23 2327 05/07/23 0449  HGB 9.4* 8.1* 7.9*  HCT 30.5* 26.2* 25.2*  MCV 96.8  --  95.1  -Check Anemia Panel in the AM and continue to hold Eliquis -He was initiated on a pantoprazole drip -Continue to Monitor for S/Sx of Bleeding; -He is on a clear liquid diet and will be recommended for n.p.o. at midnight for EGD in the morning for possible biopsies and possible esophageal dilatation -Repeat CBC int he AM -Plan is for EGD on 05/08/2023  CORONARY ATHEROSCLEROSIS NATIVE CORONARY ARTERY -Continue statin. -Hold  anticoagulation. -Monitor for any CP   Thrombocytopenia (HCC) -Platelet Count Trend: Recent Labs  Lab 05/06/23 1403 05/07/23 0449  PLT 145* 108*  -Continue to Monitor and Trend -Repeat CBC in the AM   Paroxysmal atrial fibrillation (HCC) -Hold DOAC. -Continue Amiodarone 200 mg p.o. twice daily. -Continue to Monitor on Telemetry   Essential hypertension -Hold antihypertensives due to blood loss. -Continue to Monitor BP per Protocol   Hypothyroidism -Continue LLevothyroxine 25 mcg p.o. daily.  CKD Stage 3b -BUN/Cr Trend: Recent Labs  Lab 05/06/23 1403 05/07/23 0449  BUN 33* 30*  CREATININE 2.11* 2.15*  -Currently getting IVF with NS at 50 mL/hr -C/w Sodium Bicarbonate 1950 mg po BID -Avoid Nephrotoxic Medications, Contrast Dyes, Hypotension and Dehydration to Ensure Adequate Renal Perfusion and will need to Renally Adjust Meds -Continue to Monitor and Trend Renal Function carefully and repeat CMP in the AM   HLD -C/w Rosuvastatin 5 mg po Daily  Pancytopenia -CBC Trend: Recent Labs  Lab 05/06/23 1403 05/06/23 2327 05/07/23 0449  WBC 4.4  --  3.8*  HGB 9.4*   < > 7.9*  HCT 30.5*   < > 25.2*  MCV 96.8  --  95.1  PLT 145*  --  108*   < > = values in this interval not displayed.  -Continue to Monitor and Trend and repeat CBC in the AM  Hypoalbuminemia -Patient's Albumin Trend: Recent Labs  Lab 05/06/23 1403 05/07/23 0449  ALBUMIN 3.3* 3.1*  -Continue to Monitor and Trend and repeat CMP in the AM  Overweight -Complicates overall prognosis and care -Estimated body mass index is 27.55 kg/m as calculated from the following:   Height as of  this encounter: 5\' 10"  (1.778 m).   Weight as of this encounter: 87.1 kg.  -Weight Loss and Dietary Counseling given   DVT prophylaxis: SCDs Start: 05/06/23 1835    Code Status: Full Code Family Communication: Discussed with wife at bedside  Disposition Plan:  Level of care: Progressive Status is:  Inpatient Remains inpatient appropriate because: His further workup for his melena and generalized weakness   Consultants:  Gastroenterology  Procedures:  None but going for an EGD in the morning  Antimicrobials:  Anti-infectives (From admission, onward)    None       Subjective: Seen and examined at bedside and he is still feeling weak.  Thinks the bleeding has slowed down and has improved.  No nausea.  Feels okay.  Denies any other concerns or complaints at this time.  Objective: Vitals:   05/07/23 0202 05/07/23 0533 05/07/23 1032 05/07/23 1934  BP: (!) 101/53 (!) 101/51 (!) 107/55 115/65  Pulse: 63 (!) 52 60 (!) 57  Resp: 16 18 19 18   Temp: 98.1 F (36.7 C) 97.9 F (36.6 C) 97.8 F (36.6 C) 98.2 F (36.8 C)  TempSrc: Oral Oral Oral Oral  SpO2: 96% 100% 100% 99%  Weight:      Height:        Intake/Output Summary (Last 24 hours) at 05/07/2023 2033 Last data filed at 05/07/2023 1900 Gross per 24 hour  Intake 1352.17 ml  Output 300 ml  Net 1052.17 ml   Filed Weights   05/06/23 1350  Weight: 87.1 kg   Examination: Physical Exam:  Constitutional: WN/WD elderly Caucasian male in no acute distress.  Calm Respiratory: Diminished to auscultation bilaterally, no wheezing, rales, rhonchi or crackles. Normal respiratory effort and patient is not tachypenic. No accessory muscle use.  Unlabored breathing Cardiovascular: RRR, no murmurs / rubs / gallops. S1 and S2 auscultated.  Very trace extremity edema Abdomen: Soft, non-tender, distended secondary to body habitus. Bowel sounds positive.  GU: Deferred. Musculoskeletal: No clubbing / cyanosis of digits/nails. No joint deformity upper and lower extremities.  Skin: No rashes, lesions, ulcers on limited skin evaluation. No induration; Warm and dry.  Neurologic: CN 2-12 grossly intact with no focal deficits. Romberg sign and cerebellar reflexes not assessed.  Psychiatric: Normal judgment and insight. Alert and oriented x 3.  Normal mood and appropriate affect.   Data Reviewed: I have personally reviewed following labs and imaging studies  CBC: Recent Labs  Lab 05/06/23 1403 05/06/23 2327 05/07/23 0449 05/07/23 1534  WBC 4.4  --  3.8*  --   HGB 9.4* 8.1* 7.9* 8.3*  HCT 30.5* 26.2* 25.2* 26.7*  MCV 96.8  --  95.1  --   PLT 145*  --  108*  --    Basic Metabolic Panel: Recent Labs  Lab 05/06/23 1403 05/07/23 0449  NA 140 139  K 5.3* 5.8*  CL 113* 108  CO2 22 24  GLUCOSE 98 107*  BUN 33* 30*  CREATININE 2.11* 2.15*  CALCIUM 8.3* 8.3*   GFR: Estimated Creatinine Clearance: 28.3 mL/min (A) (by C-G formula based on SCr of 2.15 mg/dL (H)). Liver Function Tests: Recent Labs  Lab 05/06/23 1403 05/07/23 0449  AST 29 50*  ALT 25 40  ALKPHOS 102 95  BILITOT 0.2* 0.8  PROT 5.8* 5.2*  ALBUMIN 3.3* 3.1*   No results for input(s): "LIPASE", "AMYLASE" in the last 168 hours. No results for input(s): "AMMONIA" in the last 168 hours. Coagulation Profile: Recent Labs  Lab 05/06/23 1515  INR 1.2   Cardiac Enzymes: No results for input(s): "CKTOTAL", "CKMB", "CKMBINDEX", "TROPONINI" in the last 168 hours. BNP (last 3 results) No results for input(s): "PROBNP" in the last 8760 hours. HbA1C: No results for input(s): "HGBA1C" in the last 72 hours. CBG: No results for input(s): "GLUCAP" in the last 168 hours. Lipid Profile: No results for input(s): "CHOL", "HDL", "LDLCALC", "TRIG", "CHOLHDL", "LDLDIRECT" in the last 72 hours. Thyroid Function Tests: No results for input(s): "TSH", "T4TOTAL", "FREET4", "T3FREE", "THYROIDAB" in the last 72 hours. Anemia Panel: Recent Labs    05/07/23 1050 05/07/23 1218  VITAMINB12 89*  --   FOLATE 35.7  --   FERRITIN 28  --   TIBC 458*  --   IRON 79  --   RETICCTPCT  --  3.0   Sepsis Labs: No results for input(s): "PROCALCITON", "LATICACIDVEN" in the last 168 hours.  No results found for this or any previous visit (from the past 240 hour(s)).    Radiology Studies: No results found.  Scheduled Meds:  amiodarone  200 mg Oral Daily   febuxostat  80 mg Oral Daily   levothyroxine  25 mcg Oral Q0600   [START ON 05/10/2023] pantoprazole  40 mg Intravenous Q12H   pantoprazole (PROTONIX) IV  40 mg Intravenous Once   PARoxetine  20 mg Oral Daily   rosuvastatin  5 mg Oral Daily   sodium bicarbonate  1,950 mg Oral BID   traZODone  150 mg Oral QHS   Continuous Infusions:  sodium chloride 50 mL/hr at 05/07/23 2018   sodium chloride     pantoprazole 8 mg/hr (05/07/23 2018)    LOS: 1 day   Marguerita Merles, DO [facility] Available via Epic secure chat 7am-7pm After these hours, please refer to coverage provider listed on [email or web address] 05/07/2023, 8:33 PM

## 2023-05-07 NOTE — TOC Initial Note (Signed)
Transition of Care [facility]) - Initial/Assessment Note    Patient Details  Name: Derek [name] MRN: [id number] Date of Birth: 1940-09-15  [facility] of [facility]) CM/SW Contact:    Adrian Prows, RN Phone Number: 05/07/2023, 10:04 AM  Clinical Narrative:                 Sherron Monday w/ pt in room; pt says he is from home and plans to return at d/c; he identified POC wife Tayo Letona [phone]); he has transportation, and denies SDOH risks; pt says he has glasses; he does not have DME, HH services or home oxygen; no TOC needs; please place consult if needed.  Expected Discharge Plan: Home/Self Care Barriers to Discharge: Continued Medical Work up   Patient Goals and CMS Choice Patient states their goals for this hospitalization and ongoing recovery are:: home          Expected Discharge Plan and Services   Discharge Planning Services: CM Consult   Living arrangements for the past 2 months: Single Family Home                                      Prior Living Arrangements/Services Living arrangements for the past 2 months: Single Family Home Lives with:: Spouse Patient language and need for interpreter reviewed:: Yes Do you feel safe going back to the place where you live?: Yes      Need for Family Participation in Patient Care: Yes (Comment) Care giver support system in place?: Yes (comment) Current home services:  (n/a) Criminal Activity/Legal Involvement Pertinent to Current Situation/Hospitalization: No - Comment as needed  Activities of Daily Living   ADL Screening (condition at time of admission) Independently performs ADLs?: Yes (appropriate for developmental age) Is the patient deaf or have difficulty hearing?: No Does the patient have difficulty seeing, even when wearing glasses/contacts?: No Does the patient have difficulty concentrating, remembering, or making decisions?: No  Permission Sought/Granted Permission sought to share information  with : Case Manager Permission granted to share information with : Yes, Verbal Permission Granted  Share Information with NAME: Case Manager     Permission granted to share info w Relationship: Vahid Graessle (spouse) [phone]     Emotional Assessment Appearance:: Appears stated age Attitude/Demeanor/Rapport: Gracious Affect (typically observed): Accepting Orientation: : Oriented to Self, Oriented to Place, Oriented to  Time, Oriented to Situation Alcohol / Substance Use: Not Applicable Psych Involvement: No (comment)  Admission diagnosis:  Melena [K92.1] Gastrointestinal hemorrhage with melena [K92.1] Anemia, unspecified type [D64.9] Patient Active Problem List   Diagnosis Date Noted   Gastrointestinal hemorrhage with melena 05/06/2023   Ground glass opacity present on imaging of lung 11/26/2021   Diarrhea 11/26/2021   Draining cutaneous sinus tract 10/29/2021   Hemorrhage of gastrointestinal tract 05/28/2021   Preop cardiovascular exam 04/21/2021   COVID-19 virus infection 03/29/2021   Hypocalcemia 03/29/2021   Atrial fibrillation, chronic (HCC) 03/29/2021   Hyperlipidemia 03/29/2021   Hypothyroidism 03/29/2021   Personal history of colon cancer 08/14/2019   Elevated LFTs 03/12/2019   Osteoarthritis of left knee 08/23/2018   History of gout 08/23/2018   Thoracic aortic aneurysm without rupture (HCC) 10/26/2017   Aortic valve regurgitation 11/05/2016   Chronic anticoagulation 08/18/2016   Sinus bradycardia 08/17/2016   S/P colon resection 08/14/2016   Adenomatous polyp of transverse colon s/p partial colectomy 08/14/2016 08/14/2016   Essential hypertension 05/27/2016  Peripheral neuropathic pain 05/26/2016   GERD (gastroesophageal reflux disease) 05/26/2016   Paroxysmal atrial fibrillation (HCC) 05/26/2016   Lung nodule 05/12/2016   Enteric hyperoxaluria 02/21/2016   Secondary hyperparathyroidism (HCC) 02/21/2016   Pain in joint, shoulder region 10/12/2013    Decreased range of motion of left shoulder 10/12/2013   Muscle weakness (generalized) 10/12/2013   Restriction of joint motion 10/12/2013   S/P shoulder replacement 09/21/2013   Nonischemic cardiomyopathy (HCC) 09/07/2013   Anxiety 08/22/2011   Stage 3b chronic kidney disease (CKD) (HCC) 08/22/2011   Thrombocytopenia (HCC) 08/22/2011   CORONARY ATHEROSCLEROSIS NATIVE CORONARY ARTERY 06/25/2010   Crohn's ileocolitis (HCC) 06/26/2009   B12 DEFICIENCY 05/09/2008   Gout 05/09/2008   POST TRAUMATIC STRESS SYNDROME 05/09/2008   GERD 05/09/2008   HEPATITIS C Ab positive RNA neg, HX OF 05/09/2008   PULMONARY EMBOLISM, HX OF 05/09/2008   History of small bowel obstruction 05/09/2008   PCP:  Assunta Found, MD Pharmacy:   [facility] - [place], [place] - [address] [place] [address] Phone: [phone] Fax: [phone]     Social Determinants of Health (SDOH) Social History: SDOH Screenings   Food Insecurity: No Food Insecurity (05/07/2023)  Housing: Low Risk  (05/07/2023)  Transportation Needs: No Transportation Needs (05/07/2023)  Utilities: Not At Risk (05/07/2023)  Depression (PHQ2-9): Low Risk  (11/26/2021)  Financial Resource Strain: Low Risk  (04/08/2022)  Tobacco Use: Medium Risk (05/06/2023)   SDOH Interventions: Food Insecurity Interventions: Intervention Not Indicated, Inpatient TOC Housing Interventions: Intervention Not Indicated, Inpatient TOC Transportation Interventions: Intervention Not Indicated, Inpatient TOC Utilities Interventions: Intervention Not Indicated, Inpatient TOC   Readmission Risk Interventions     No data to display

## 2023-05-07 NOTE — Anesthesia Preprocedure Evaluation (Signed)
Anesthesia Evaluation  Patient identified by MRN, date of birth, ID band Patient awake    Reviewed: Allergy & Precautions, NPO status , Patient's Chart, lab work & pertinent test results  History of Anesthesia Complications Negative for: history of anesthetic complications  Airway Mallampati: II  TM Distance: >3 FB Neck ROM: Full    Dental  (+) Dental Advisory Given, Implants   Pulmonary former smoker   Pulmonary exam normal        Cardiovascular hypertension, Pt. on medications + CAD and + DVT  Normal cardiovascular exam+ dysrhythmias Atrial Fibrillation      Neuro/Psych  PSYCHIATRIC DISORDERS Anxiety      Neuromuscular disease    GI/Hepatic Neg liver ROS, hiatal hernia,GERD  Controlled and Medicated,, Crohn's disease    Endo/Other  Hypothyroidism    Renal/GU CRFRenal disease     Musculoskeletal  (+) Arthritis ,    Abdominal   Peds  Hematology  On eliquis    Anesthesia Other Findings   Reproductive/Obstetrics                             Anesthesia Physical Anesthesia Plan  ASA: 3  Anesthesia Plan: MAC   Post-op Pain Management: Minimal or no pain anticipated   Induction:   PONV Risk Score and Plan: 1 and Propofol infusion and Treatment may vary due to age or medical condition  Airway Management Planned: Nasal Cannula and Natural Airway  Additional Equipment: None  Intra-op Plan:   Post-operative Plan:   Informed Consent: I have reviewed the patients History and Physical, chart, labs and discussed the procedure including the risks, benefits and alternatives for the proposed anesthesia with the patient or authorized representative who has indicated his/her understanding and acceptance.       Plan Discussed with: CRNA and Anesthesiologist  Anesthesia Plan Comments:        Anesthesia Quick Evaluation
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
## 2023-05-08 ENCOUNTER — Inpatient Hospital Stay (HOSPITAL_COMMUNITY): Payer: No Typology Code available for payment source | Admitting: Anesthesiology

## 2023-05-08 ENCOUNTER — Encounter (HOSPITAL_COMMUNITY): Payer: Self-pay | Admitting: Internal Medicine

## 2023-05-08 ENCOUNTER — Encounter (HOSPITAL_COMMUNITY)
Admission: EM | Disposition: A | Discharge status: Home / Self Care | Payer: Self-pay | Source: Home / Self Care | Attending: Internal Medicine

## 2023-05-08 DIAGNOSIS — K219 Gastro-esophageal reflux disease without esophagitis: Secondary | ICD-10-CM | POA: Diagnosis not present

## 2023-05-08 DIAGNOSIS — E039 Hypothyroidism, unspecified: Secondary | ICD-10-CM

## 2023-05-08 DIAGNOSIS — K269 Duodenal ulcer, unspecified as acute or chronic, without hemorrhage or perforation: Secondary | ICD-10-CM | POA: Diagnosis not present

## 2023-05-08 DIAGNOSIS — K921 Melena: Secondary | ICD-10-CM | POA: Diagnosis not present

## 2023-05-08 DIAGNOSIS — I2510 Atherosclerotic heart disease of native coronary artery without angina pectoris: Secondary | ICD-10-CM | POA: Diagnosis not present

## 2023-05-08 HISTORY — PX: BIOPSY: SHX5522

## 2023-05-08 HISTORY — PX: ESOPHAGOGASTRODUODENOSCOPY (EGD) WITH PROPOFOL: SHX5813

## 2023-05-08 LAB — CBC WITH DIFFERENTIAL/PLATELET
Abs Immature Granulocytes: 0.02 10*3/uL (ref 0.00–0.07)
Abs Immature Granulocytes: 0.02 10*3/uL (ref 0.00–0.07)
Basophils Absolute: 0 10*3/uL (ref 0.0–0.1)
Basophils Absolute: 0 10*3/uL (ref 0.0–0.1)
Basophils Relative: 1 %
Basophils Relative: 0 %
Eosinophils Absolute: 0.1 10*3/uL (ref 0.0–0.5)
Eosinophils Absolute: 0.1 10*3/uL (ref 0.0–0.5)
Eosinophils Relative: 2 %
Eosinophils Relative: 2 %
HCT: 24.8 % — ABNORMAL LOW (ref 39.0–52.0)
HCT: 23.7 % — ABNORMAL LOW (ref 39.0–52.0)
Hemoglobin: 7.5 g/dL — ABNORMAL LOW (ref 13.0–17.0)
Hemoglobin: 7.3 g/dL — ABNORMAL LOW (ref 13.0–17.0)
Immature Granulocytes: 1 %
Immature Granulocytes: 1 %
Lymphocytes Relative: 22 %
Lymphocytes Relative: 23 %
Lymphs Abs: 0.7 10*3/uL (ref 0.7–4.0)
Lymphs Abs: 0.6 10*3/uL — ABNORMAL LOW (ref 0.7–4.0)
MCH: 29.6 pg (ref 26.0–34.0)
MCH: 30 pg (ref 26.0–34.0)
MCHC: 30.2 g/dL (ref 30.0–36.0)
MCHC: 30.8 g/dL (ref 30.0–36.0)
MCV: 98 fL (ref 80.0–100.0)
MCV: 97.5 fL (ref 80.0–100.0)
Monocytes Absolute: 0.2 10*3/uL (ref 0.1–1.0)
Monocytes Absolute: 0.2 10*3/uL (ref 0.1–1.0)
Monocytes Relative: 5 %
Monocytes Relative: 8 %
Neutro Abs: 2.2 10*3/uL (ref 1.7–7.7)
Neutro Abs: 1.9 10*3/uL (ref 1.7–7.7)
Neutrophils Relative %: 69 %
Neutrophils Relative %: 66 %
Platelets: 115 10*3/uL — ABNORMAL LOW (ref 150–400)
Platelets: 102 10*3/uL — ABNORMAL LOW (ref 150–400)
RBC: 2.53 MIL/uL — ABNORMAL LOW (ref 4.22–5.81)
RBC: 2.43 MIL/uL — ABNORMAL LOW (ref 4.22–5.81)
RDW: 15.3 % (ref 11.5–15.5)
RDW: 15.4 % (ref 11.5–15.5)
WBC: 3.1 10*3/uL — ABNORMAL LOW (ref 4.0–10.5)
WBC: 2.8 10*3/uL — ABNORMAL LOW (ref 4.0–10.5)
nRBC: 0 % (ref 0.0–0.2)
nRBC: 0 % (ref 0.0–0.2)

## 2023-05-08 LAB — COMPREHENSIVE METABOLIC PANEL
ALT: 38 U/L (ref 0–44)
AST: 39 U/L (ref 15–41)
Albumin: 3.2 g/dL — ABNORMAL LOW (ref 3.5–5.0)
Alkaline Phosphatase: 102 U/L (ref 38–126)
Anion gap: 9 (ref 5–15)
BUN: 22 mg/dL (ref 8–23)
CO2: 21 mmol/L — ABNORMAL LOW (ref 22–32)
Calcium: 8.1 mg/dL — ABNORMAL LOW (ref 8.9–10.3)
Chloride: 108 mmol/L (ref 98–111)
Creatinine, Ser: 2.16 mg/dL — ABNORMAL HIGH (ref 0.61–1.24)
GFR, Estimated: 30 mL/min — ABNORMAL LOW (ref >60)
Glucose, Bld: 103 mg/dL — ABNORMAL HIGH (ref 70–99)
Potassium: 5.2 mmol/L — ABNORMAL HIGH (ref 3.5–5.1)
Sodium: 138 mmol/L (ref 135–145)
Total Bilirubin: 0.7 mg/dL (ref 0.3–1.2)
Total Protein: 5.2 g/dL — ABNORMAL LOW (ref 6.5–8.1)

## 2023-05-08 LAB — GLUCOSE, CAPILLARY: Glucose-Capillary: 93 mg/dL (ref 70–99)

## 2023-05-08 LAB — PHOSPHORUS: Phosphorus: 3.9 mg/dL (ref 2.5–4.6)

## 2023-05-08 LAB — PREPARE RBC (CROSSMATCH)

## 2023-05-08 LAB — MAGNESIUM: Magnesium: 1.8 mg/dL (ref 1.7–2.4)

## 2023-05-08 SURGERY — ESOPHAGOGASTRODUODENOSCOPY (EGD) WITH PROPOFOL
Anesthesia: Monitor Anesthesia Care

## 2023-05-08 MED ORDER — SODIUM CHLORIDE 0.9% IV SOLUTION: Freq: Once | INTRAVENOUS | Status: AC

## 2023-05-08 MED ORDER — PANTOPRAZOLE SODIUM 40 MG PO TBEC
40.0000 mg | DELAYED_RELEASE_TABLET | Freq: Two times a day (BID) | ORAL | Status: DC
  Administered 2023-05-08 – 2023-05-09 (×3): 40 mg via ORAL
  Filled 2023-05-08 (×3): qty 1

## 2023-05-08 MED ORDER — PROPOFOL 500 MG/50ML IV EMUL
INTRAVENOUS | Status: DC | PRN
  Administered 2023-05-08: 125 ug/kg/min via INTRAVENOUS

## 2023-05-08 MED ORDER — SODIUM ZIRCONIUM CYCLOSILICATE 10 G PO PACK
10.0000 g | PACK | Freq: Once | ORAL | Status: AC
  Administered 2023-05-08: 10 g via ORAL
  Filled 2023-05-08: qty 1

## 2023-05-08 MED ORDER — PROPOFOL 10 MG/ML IV BOLUS
INTRAVENOUS | Status: DC | PRN
  Administered 2023-05-08 (×2): 20 mg via INTRAVENOUS

## 2023-05-08 MED ORDER — PROPOFOL 10 MG/ML IV BOLUS
INTRAVENOUS | Status: AC
  Filled 2023-05-08: qty 20

## 2023-05-08 MED ORDER — VITAMIN B-12 1000 MCG PO TABS
1000.0000 ug | ORAL_TABLET | Freq: Every day | ORAL | Status: DC
  Administered 2023-05-09: 1000 ug via ORAL
  Filled 2023-05-08: qty 1

## 2023-05-08 MED ORDER — LIDOCAINE 2% (20 MG/ML) 5 ML SYRINGE
INTRAMUSCULAR | Status: DC | PRN
  Administered 2023-05-08: 100 mg via INTRAVENOUS

## 2023-05-08 MED ORDER — PROPOFOL 1000 MG/100ML IV EMUL
INTRAVENOUS | Status: AC
  Filled 2023-05-08: qty 100

## 2023-05-08 MED ORDER — EPHEDRINE SULFATE-NACL 50-0.9 MG/10ML-% IV SOSY
PREFILLED_SYRINGE | INTRAVENOUS | Status: DC | PRN
  Administered 2023-05-08: 10 mg via INTRAVENOUS

## 2023-05-08 SURGICAL SUPPLY — 15 items

## 2023-05-08 NOTE — Transfer of Care (Signed)
Immediate Anesthesia Transfer of Care Note  Patient: Derek [name]  Procedure(s) Performed: ESOPHAGOGASTRODUODENOSCOPY (EGD) WITH PROPOFOL  Patient Location: Endoscopy Unit  Anesthesia Type:MAC  Level of Consciousness: drowsy and patient cooperative  Airway & Oxygen Therapy: Patient Spontanous Breathing and Patient connected to face mask oxygen  Post-op Assessment: Report given to RN and Post -op Vital signs reviewed and stable  Post vital signs: Reviewed and stable  Last Vitals:  Vitals Value Taken Time  BP    Temp    Pulse 63 05/08/23 0955  Resp 15 05/08/23 0955  SpO2 100 % 05/08/23 0955    Last Pain:  Vitals:   05/08/23 0909  TempSrc: Tympanic  PainSc: 0-No pain         Complications: No notable events documented.

## 2023-05-08 NOTE — Progress Notes (Signed)
Mobility Specialist - Progress Note   05/08/23 1421  Mobility  Activity Ambulated independently to bathroom  Level of Assistance Independent  Assistive Device None  Distance Ambulated (ft) 5 ft  Range of Motion/Exercises Active  Activity Response Tolerated well  Mobility Referral Yes  $Mobility charge 1 Mobility   Pt sitting EOB wanting assistance to bathroom. Pt able to ambulate to bathroom without assistance. NT to room.  Derek [name] Mobility Specialist

## 2023-05-08 NOTE — Progress Notes (Signed)
PROGRESS NOTE    Derek [name]  GEX:[id number] DOB: January 10, 1941 DOA: 05/06/2023 PCP: Assunta Found, MD   Brief Narrative:  The patient is a 82 year old Caucasian male with a past medical history significant for but limited to anemia, anxiety, aortic insufficiency, aortic ascending aneurysm, proximal atrial fibrillation on anticoagulant with Eliquis, heart murmur, hypertension, left ventricular dysfunction, CAD, osteoarthritis, B12 deficiency, history of colon cancer, history of Crohn's disease, history of bowel perforation, history of small bowel obstruction, colonic ischemia, history of pancreatitis, history of GERD, history of high output ileostomy and ileostomy reversal, stage IIIb kidney disease as well as other comorbidities who presented to the ED with multiple episodes of melena associate with dizziness, mild dyspnea and generalized weakness.  He initially had been made n.p.o. and GI been consulted and they are planning EGD and now they are planning it for 05/08/2023 so he will be on a clear liquid diet.  His anticoagulation has been held.  Currently getting very low-dose maintenance IV fluids at 50 mL/h but will stop and give him 1 unit of pRBCs.   Assessment and Plan:  ABLA in the setting of Melena with associated Dizziness, Weakness and Slight Dyspnea from a Duodenal Ulcer -Baseline hemoglobin is around 10-11 and patient admits to fatigue and he is on anticoagulation with Eliquis -Hgb/Hct Trend: Recent Labs  Lab 05/06/23 1403 05/06/23 2327 05/07/23 0449 05/07/23 1534 05/08/23 0526 05/08/23 1416  HGB 9.4* 8.1* 7.9* 8.3* 7.5* 7.3*  HCT 30.5* 26.2* 25.2* 26.7* 24.8* 23.7*  MCV 96.8  --  95.1  --  98.0 97.5  -Checked Anemia Panel i and it showed an iron level of 79, UIBC 371, TIBC 458, saturation ratios of 17%, ferritin level 20, folate level is 35.7 and vitamin B12 89 -Will start vitamin B12 supplementation with 1000 mcg and got a x1 IM shot and will start po supplementation 1000  mcg po Daily  -Continue to hold Eliquis -Given dropping Hgb will Type and Screen and transfuse 1 unit of prRBC today -He was initiated on a pantoprazole drip and will need BID PPI at D/C  -Continue to Monitor for S/Sx of Bleeding; -EGD done and showed "The esophagus was normal. The stomach was normal. Biopsies were taken with a cold forceps from the gastric antrum for histology (rule out H. pylori). One non-bleeding cratered duodenal ulcer with no stigmata of bleeding was found in the duodenal bulb. The lesion was 8 mm in largest dimension. The examined duodenum was otherwise normal.The cardia and gastric fundus were normal on retroflexion." -GI recommends changing the PPI to 40 mg p.o. twice daily for 1 month and then 40 mg p.o. daily as well as resuming regular diet and resuming Eliquis upon discharge as well as awaiting pathology results -Repeat CBC in the AM  CORONARY ATHEROSCLEROSIS NATIVE CORONARY ARTERY -Continue statin. -Hold anticoagulation. -Monitor for any CP   Thrombocytopenia (HCC) -Platelet Count Trend: Recent Labs  Lab 05/06/23 1403 05/07/23 0449 05/08/23 0526 05/08/23 1416  PLT 145* 108* 115* 102*  -Continue to Monitor and Trend -Repeat CBC in the AM   Paroxysmal Atrial Fibrillation (HCC) -Hold DOA for now and resume at D/C -Continue Amiodarone 200 mg p.o. twice daily. -Continue to Monitor on Telemetry  Hyperkalemia -Patient's K+ Level Trend: Recent Labs  Lab 05/06/23 1403 05/07/23 0449 05/08/23 0526  K 5.3* 5.8* 5.2*  -Give Lokelma 10 mg x1 -Continue to Monitor and Replete as Necessary -Repeat CMP in the AM    Essential Hypertension -Hold antihypertensives due to  blood loss. -Continue to Monitor BP per Protocol -Last BP reading was 111/52   Hypothyroidism -Continue Levothyroxine 25 mcg p.o. daily.  Generalized Weakness -In the setting of his acute blood loss anemia -Monitor for further blood loss and he is on gentle IV fluid hydration at 50  cc/h -Obtain PT OT to further evaluate and treat in the a.m.  CKD Stage 3b -BUN/Cr Trend: Recent Labs  Lab 05/06/23 1403 05/07/23 0449 05/08/23 0526  BUN 33* 30* 22  CREATININE 2.11* 2.15* 2.16*  -Will discontinue IVF with NS at 50 mL/hr given that we will get 1 unit of pRBC -C/w Sodium Bicarbonate 1950 mg po BID -Avoid Nephrotoxic Medications, Contrast Dyes, Hypotension and Dehydration to Ensure Adequate Renal Perfusion and will need to Renally Adjust Meds -Continue to Monitor and Trend Renal Function carefully and repeat CMP in the AM   HLD -C/w Rosuvastatin 5 mg po Daily  Pancytopenia -CBC Trend: Recent Labs  Lab 05/06/23 1403 05/06/23 2327 05/07/23 0449 05/07/23 1534 05/08/23 0526 05/08/23 1416  WBC 4.4  --  3.8*  --  3.1* 2.8*  HGB 9.4*   < > 7.9*   < > 7.5* 7.3*  HCT 30.5*   < > 25.2*   < > 24.8* 23.7*  MCV 96.8  --  95.1  --  98.0 97.5  PLT 145*  --  108*  --  115* 102*   < > = values in this interval not displayed.  -Continue to Monitor and Trend and repeat CBC in the AM  Hypoalbuminemia -Patient's Albumin Trend: Recent Labs  Lab 05/06/23 1403 05/07/23 0449 05/08/23 0526  ALBUMIN 3.3* 3.1* 3.2*  -Continue to Monitor and Trend and repeat CMP in the AM  Overweight -Complicates overall prognosis and care -Estimated body mass index is 27.55 kg/m as calculated from the following:   Height as of this encounter: 5\' 10"  (1.778 m).   Weight as of this encounter: 87.1 kg.  -Weight Loss and Dietary Counseling given   DVT prophylaxis: SCDs Start: 05/06/23 1835    Code Status: Full Code Family Communication: Discussed with Wife and Daughter at bedside  Disposition Plan:  Level of care: Progressive Status is: Inpatient Remains inpatient appropriate because: Will monitor overnight and give him a unit of blood and see how he does   Consultants:  Gastroenterology  Procedures:  EGD:  Findings:      The esophagus was normal.      The stomach was normal.  Biopsies were taken with a cold forceps from the       gastric antrum for histology (rule out H. pylori).      One non-bleeding cratered duodenal ulcer with no stigmata of bleeding       was found in the duodenal bulb. The lesion was 8 mm in largest dimension.      The examined duodenum was otherwise normal.      The cardia and gastric fundus were normal on retroflexion. Impression:               - Normal esophagus.                           - Normal stomach. Biopsied.                           - Non-bleeding duodenal ulcer with no stigmata of  bleeding.                           - Normal examined duodenum. Moderate Sedation:      none Recommendation:           1. Changed to pantoprazole 40 mg p.o. twice daily                            for 1 month then 40 mg p.o. daily indefinitely                           2. Regular diet                           3. Okay to resume Eliquis upon discharge                           4. Await pathology results. We will follow-up on                            this result.                           5. Avoid aspirin and NSAIDs                           6. Okay for discharge home today. He does not need                            outpatient GI follow-up. He should have his                            hemoglobin trended by his PCP.  Antimicrobials:  Anti-infectives (From admission, onward)    None       Subjective: Seen and examined at bedside and he had just come back from his endoscopy and was feeling okay.  Blood count continues to drop slowly so we will give him 1 unit of PRBC.  Will need to ensure that he has no further bleeding episodes and anticipate discharging home in the next 24 hours.  Patient is hungry and will restart diet.  Objective: Vitals:   05/08/23 1025 05/08/23 1029 05/08/23 1322 05/08/23 1326  BP:  (!) 106/44 (!) 111/52   Pulse: (!) 57 (!) 57 (!) 56 (!) 58  Resp: 14 14    Temp:   97.8 F (36.6 C)    TempSrc:   Oral   SpO2: 100% 97% 100%   Weight:      Height:        Intake/Output Summary (Last 24 hours) at 05/08/2023 1606 Last data filed at 05/08/2023 0945 Gross per 24 hour  Intake 1070.06 ml  Output 400 ml  Net 670.06 ml   Filed Weights   05/06/23 1350 05/08/23 0909  Weight: 87.1 kg 87.1 kg   Examination: Physical Exam:  Constitutional: WN/WD overweight Caucasian male in NAD appears calm Respiratory: Diminished to auscultation bilaterally, no wheezing, rales, rhonchi or crackles. Normal respiratory effort and patient is not tachypenic. No accessory muscle use. Unlabored breathing  Cardiovascular: RRR, no murmurs /  rubs / gallops. S1 and S2 auscultated. Trace edema.  Abdomen: Soft, non-tender, Distended 2/2 body habitus. Bowel sounds positive.  GU: Deferred. Musculoskeletal: No clubbing / cyanosis of digits/nails. No joint deformity upper and lower extremities.  Skin: No rashes, lesions, ulcers on a limited skin evaluation. No induration; Warm and dry.  Neurologic: CN 2-12 grossly intact with no focal deficits. Romberg sign and cerebellar reflexes not assessed.  Psychiatric: Normal judgment and insight. Alert and oriented x 3. Normal mood and appropriate affect.   Data Reviewed: I have personally reviewed following labs and imaging studies  CBC: Recent Labs  Lab 05/06/23 1403 05/06/23 2327 05/07/23 0449 05/07/23 1534 05/08/23 0526 05/08/23 1416  WBC 4.4  --  3.8*  --  3.1* 2.8*  NEUTROABS  --   --   --   --  2.2 1.9  HGB 9.4* 8.1* 7.9* 8.3* 7.5* 7.3*  HCT 30.5* 26.2* 25.2* 26.7* 24.8* 23.7*  MCV 96.8  --  95.1  --  98.0 97.5  PLT 145*  --  108*  --  115* 102*   Basic Metabolic Panel: Recent Labs  Lab 05/06/23 1403 05/07/23 0449 05/08/23 0526  NA 140 139 138  K 5.3* 5.8* 5.2*  CL 113* 108 108  CO2 22 24 21*  GLUCOSE 98 107* 103*  BUN 33* 30* 22  CREATININE 2.11* 2.15* 2.16*  CALCIUM 8.3* 8.3* 8.1*  MG  --   --  1.8  PHOS  --   --  3.9    GFR: Estimated Creatinine Clearance: 28.2 mL/min (A) (by C-G formula based on SCr of 2.16 mg/dL (H)). Liver Function Tests: Recent Labs  Lab 05/06/23 1403 05/07/23 0449 05/08/23 0526  AST 29 50* 39  ALT 25 40 38  ALKPHOS 102 95 102  BILITOT 0.2* 0.8 0.7  PROT 5.8* 5.2* 5.2*  ALBUMIN 3.3* 3.1* 3.2*   No results for input(s): "LIPASE", "AMYLASE" in the last 168 hours. No results for input(s): "AMMONIA" in the last 168 hours. Coagulation Profile: Recent Labs  Lab 05/06/23 1515  INR 1.2   Cardiac Enzymes: No results for input(s): "CKTOTAL", "CKMB", "CKMBINDEX", "TROPONINI" in the last 168 hours. BNP (last 3 results) No results for input(s): "PROBNP" in the last 8760 hours. HbA1C: No results for input(s): "HGBA1C" in the last 72 hours. CBG: Recent Labs  Lab 05/08/23 0527  GLUCAP 93   Lipid Profile: No results for input(s): "CHOL", "HDL", "LDLCALC", "TRIG", "CHOLHDL", "LDLDIRECT" in the last 72 hours. Thyroid Function Tests: No results for input(s): "TSH", "T4TOTAL", "FREET4", "T3FREE", "THYROIDAB" in the last 72 hours. Anemia Panel: Recent Labs    05/07/23 1050 05/07/23 1218  VITAMINB12 89*  --   FOLATE 35.7  --   FERRITIN 28  --   TIBC 458*  --   IRON 79  --   RETICCTPCT  --  3.0   Sepsis Labs: No results for input(s): "PROCALCITON", "LATICACIDVEN" in the last 168 hours.  No results found for this or any previous visit (from the past 240 hour(s)).   Radiology Studies: No results found.  Scheduled Meds:  sodium chloride   Intravenous Once   amiodarone  200 mg Oral Daily   [START ON 05/09/2023] vitamin B-12  1,000 mcg Oral Daily   febuxostat  80 mg Oral Daily   levothyroxine  25 mcg Oral Q0600   pantoprazole  40 mg Oral BID   PARoxetine  20 mg Oral Daily   rosuvastatin  5 mg Oral Daily   sodium  bicarbonate  1,950 mg Oral BID   traZODone  150 mg Oral QHS   Continuous Infusions:   LOS: 2 days   Marguerita Merles, DO [facility] Available via  Epic secure chat 7am-7pm After these hours, please refer to coverage provider listed on [email or web address] 05/08/2023, 4:06 PM

## 2023-05-08 NOTE — Anesthesia Postprocedure Evaluation (Signed)
Anesthesia Post Note  Patient: Derek [name]  Procedure(s) Performed: ESOPHAGOGASTRODUODENOSCOPY (EGD) WITH PROPOFOL BIOPSY     Patient location during evaluation: PACU Anesthesia Type: MAC Level of consciousness: awake and alert Pain management: pain level controlled Vital Signs Assessment: post-procedure vital signs reviewed and stable Respiratory status: spontaneous breathing, nonlabored ventilation and respiratory function stable Cardiovascular status: stable and blood pressure returned to baseline Anesthetic complications: no   No notable events documented.  Last Vitals:  Vitals:   05/08/23 1025 05/08/23 1029  BP:  (!) 106/44  Pulse: (!) 57 (!) 57  Resp: 14 14  Temp:    SpO2: 100% 97%    Last Pain:  Vitals:   05/08/23 1025  TempSrc:   PainSc: 0-No pain                 Beryle Lathe

## 2023-05-08 NOTE — Op Note (Signed)
[facility] Patient Name: Derek [name] Procedure Date: 05/08/2023 MRN: [id number] Attending MD: Wilhemina Bonito. Marina Goodell , MD, [id number] Date of Birth: [birth year] CSN: [id number] Age: 82 Admit Type: Inpatient Procedure:                Upper GI endoscopy with biopsies Indications:              Melena Providers:                Wilhemina Bonito. Marina Goodell, MD, Janae Sauce. Steele Berg, RN, Marja Kays, Technician Referring MD:             [facility] Medicines:                Monitored Anesthesia Care Complications:            No immediate complications. Estimated Blood Loss:     Estimated blood loss: none. Procedure:                Pre-Anesthesia Assessment:                           - Prior to the procedure, a History and Physical                            was performed, and patient medications and                            allergies were reviewed. The patient's tolerance of                            previous anesthesia was also reviewed. The risks                            and benefits of the procedure and the sedation                            options and risks were discussed with the patient.                            All questions were answered, and informed consent                            was obtained. Prior Anticoagulants: The patient has                            taken Eliquis (apixaban), last dose was 3 days                            prior to procedure. ASA Grade Assessment: III - A                            patient with severe systemic disease. After  reviewing the risks and benefits, the patient was                            deemed in satisfactory condition to undergo the                            procedure.                           After obtaining informed consent, the endoscope was                            passed under direct vision. Throughout the                            procedure, the patient's blood  pressure, pulse, and                            oxygen saturations were monitored continuously. The                            [id number]-[id number] ([id number]) Olympus endoscope was introduced                            through the mouth, and advanced to the second part                            of duodenum. The upper GI endoscopy was                            accomplished without difficulty. The patient                            tolerated the procedure well. Scope In: Scope Out: Findings:      The esophagus was normal.      The stomach was normal. Biopsies were taken with a cold forceps from the       gastric antrum for histology (rule out H. pylori).      One non-bleeding cratered duodenal ulcer with no stigmata of bleeding       was found in the duodenal bulb. The lesion was 8 mm in largest dimension.      The examined duodenum was otherwise normal.      The cardia and gastric fundus were normal on retroflexion. Impression:               - Normal esophagus.                           - Normal stomach. Biopsied.                           - Non-bleeding duodenal ulcer with no stigmata of                            bleeding.                           -  Normal examined duodenum. Moderate Sedation:      none Recommendation:           1. Changed to pantoprazole 40 mg p.o. twice daily                            for 1 month then 40 mg p.o. daily indefinitely                           2. Regular diet                           3. Okay to resume Eliquis upon discharge                           4. Await pathology results. We will follow-up on                            this result.                           5. Avoid aspirin and NSAIDs                           6. Okay for discharge home today. He does not need                            outpatient GI follow-up. He should have his                            hemoglobin trended by his PCP.                           I have reviewed the results of this  procedure as                            well as the outlined recommendations with the                            patient and his wife. They were provided a copy of                            this report. Procedure Code(s):        --- Professional ---                           [phone], Esophagogastroduodenoscopy, flexible,                            transoral; with biopsy, single or multiple Diagnosis Code(s):        --- Professional ---                           K26.9, Duodenal ulcer, unspecified as acute or  chronic, without hemorrhage or perforation                           K92.1, Melena (includes Hematochezia) CPT copyright 2022 American Medical Association. All rights reserved. The codes documented in this report are preliminary and upon coder review may  be revised to meet current compliance requirements. Wilhemina Bonito. Marina Goodell, MD 05/08/2023 10:02:09 AM This report has been signed electronically. Number of Addenda: 0

## 2023-05-08 NOTE — Interval H&P Note (Signed)
History and Physical Interval Note:  05/08/2023 8:19 AM  Derek [name]  has presented today for surgery, with the diagnosis of , history of Crohns, Hiatal hernia.  The various methods of treatment have been discussed with the patient and family. After consideration of risks, benefits and other options for treatment, the patient has consented to  Procedure(s): ESOPHAGOGASTRODUODENOSCOPY (EGD) WITH PROPOFOL (N/A) as a surgical intervention.  The patient's history has been reviewed, patient examined, no change in status, stable for surgery.  I have reviewed the patient's chart and labs.  Questions were answered to the patient's satisfaction.     [name]

## 2023-05-08 NOTE — Progress Notes (Signed)
Report received from [facility],  Clinical research associate will resume at this time. Pt resting  plan of care ongoing,

## 2023-05-09 DIAGNOSIS — I10 Essential (primary) hypertension: Secondary | ICD-10-CM | POA: Diagnosis not present

## 2023-05-09 DIAGNOSIS — K921 Melena: Secondary | ICD-10-CM | POA: Diagnosis not present

## 2023-05-09 DIAGNOSIS — I2510 Atherosclerotic heart disease of native coronary artery without angina pectoris: Secondary | ICD-10-CM | POA: Diagnosis not present

## 2023-05-09 DIAGNOSIS — K269 Duodenal ulcer, unspecified as acute or chronic, without hemorrhage or perforation: Secondary | ICD-10-CM | POA: Diagnosis not present

## 2023-05-09 LAB — COMPREHENSIVE METABOLIC PANEL
ALT: 33 U/L (ref 0–44)
AST: 32 U/L (ref 15–41)
Albumin: 3.2 g/dL — ABNORMAL LOW (ref 3.5–5.0)
Alkaline Phosphatase: 102 U/L (ref 38–126)
Anion gap: 8 (ref 5–15)
BUN: 20 mg/dL (ref 8–23)
CO2: 24 mmol/L (ref 22–32)
Calcium: 8.1 mg/dL — ABNORMAL LOW (ref 8.9–10.3)
Chloride: 109 mmol/L (ref 98–111)
Creatinine, Ser: 2.23 mg/dL — ABNORMAL HIGH (ref 0.61–1.24)
GFR, Estimated: 29 mL/min — ABNORMAL LOW (ref >60)
Glucose, Bld: 110 mg/dL — ABNORMAL HIGH (ref 70–99)
Potassium: 4.7 mmol/L (ref 3.5–5.1)
Sodium: 141 mmol/L (ref 135–145)
Total Bilirubin: 0.9 mg/dL (ref 0.3–1.2)
Total Protein: 5.4 g/dL — ABNORMAL LOW (ref 6.5–8.1)

## 2023-05-09 LAB — CBC WITH DIFFERENTIAL/PLATELET
Abs Immature Granulocytes: 0.04 10*3/uL (ref 0.00–0.07)
Basophils Absolute: 0 10*3/uL (ref 0.0–0.1)
Basophils Relative: 1 %
Eosinophils Absolute: 0.1 10*3/uL (ref 0.0–0.5)
Eosinophils Relative: 3 %
HCT: 25.6 % — ABNORMAL LOW (ref 39.0–52.0)
Hemoglobin: 8.2 g/dL — ABNORMAL LOW (ref 13.0–17.0)
Immature Granulocytes: 1 %
Lymphocytes Relative: 21 %
Lymphs Abs: 0.6 10*3/uL — ABNORMAL LOW (ref 0.7–4.0)
MCH: 30.4 pg (ref 26.0–34.0)
MCHC: 32 g/dL (ref 30.0–36.0)
MCV: 94.8 fL (ref 80.0–100.0)
Monocytes Absolute: 0.2 10*3/uL (ref 0.1–1.0)
Monocytes Relative: 7 %
Neutro Abs: 2 10*3/uL (ref 1.7–7.7)
Neutrophils Relative %: 67 %
Platelets: 101 10*3/uL — ABNORMAL LOW (ref 150–400)
RBC: 2.7 MIL/uL — ABNORMAL LOW (ref 4.22–5.81)
RDW: 15.7 % — ABNORMAL HIGH (ref 11.5–15.5)
WBC: 2.9 10*3/uL — ABNORMAL LOW (ref 4.0–10.5)
nRBC: 0 % (ref 0.0–0.2)

## 2023-05-09 LAB — MAGNESIUM: Magnesium: 1.6 mg/dL — ABNORMAL LOW (ref 1.7–2.4)

## 2023-05-09 LAB — PHOSPHORUS: Phosphorus: 4.7 mg/dL — ABNORMAL HIGH (ref 2.5–4.6)

## 2023-05-09 MED ORDER — MAGNESIUM SULFATE 2 GM/50ML IV SOLN
2.0000 g | Freq: Once | INTRAVENOUS | Status: AC
  Administered 2023-05-09: 2 g via INTRAVENOUS
  Filled 2023-05-09: qty 50

## 2023-05-09 MED ORDER — PANTOPRAZOLE SODIUM 40 MG PO TBEC: DELAYED_RELEASE_TABLET | ORAL | 0 refills | Status: AC

## 2023-05-09 MED ORDER — CYANOCOBALAMIN 1000 MCG PO TABS: 1000.0000 ug | ORAL_TABLET | Freq: Every day | ORAL | 0 refills | Status: DC

## 2023-05-09 NOTE — Evaluation (Signed)
Physical Therapy Evaluation-1x Patient Details Name: Derek [name] MRN: [id number] DOB: 1941/04/26 Today's Date: 05/09/2023  History of Present Illness  82 yo male admitted with ABLA,dizziness, weakness. Hx of AAA, Crohns disease, neuropathy, colon Ca, CKD, L TSR, PTSD, chronic thrombocytopenia, RLS, Hep C, PE, gout  Clinical Impression  On eval, pt was Supv-Mod Ind with mobility. He walked ~400 feet around the unit. Mild unsteadiness intermittently but no LOB. He ascended/descended 5 steps with use of railing without difficulty. O2 99% on RA. Pt tolerated activity well. He feels ready to d/c home. 1x eval. No acute PT needs.         If plan is discharge home, recommend the following:     Can travel by private vehicle        Equipment Recommendations None recommended by PT  Recommendations for Other Services       Functional Status Assessment Patient has had a recent decline in their functional status and demonstrates the ability to make significant improvements in function in a reasonable and predictable amount of time.     Precautions / Restrictions Precautions Precautions: Fall Restrictions Weight Bearing Restrictions: No      Mobility  Bed Mobility Overal bed mobility: Modified Independent                  Transfers Overall transfer level: Modified independent                      Ambulation/Gait Ambulation/Gait assistance: Supervision Gait Distance (Feet): 400 Feet Assistive device: None Gait Pattern/deviations: Step-through pattern       General Gait Details: Intermittent unsteadiness but no overt LOB. Tolerated distance well. O2 99% on RA. Pt denied dizziness.  Stairs Stairs: Yes Stairs assistance: Supervision Stair Management: One rail Left, Forwards Number of Stairs: 5 General stair comments: Supv for safety.  Wheelchair Mobility     Tilt Bed    Modified Rankin (Stroke Patients Only)       Balance Overall balance  assessment: Mild deficits observed, not formally tested                                           Pertinent Vitals/Pain Pain Assessment Pain Assessment: No/denies pain    Home Living Family/patient expects to be discharged to:: Private residence Living Arrangements: Spouse/significant other Available Help at Discharge: Family Type of Home: House Home Access: Stairs to enter Entrance Stairs-Rails: Doctor, general practice of Steps: 3   Home Layout: One level Home Equipment: Agricultural consultant (2 wheels);Cane - single point      Prior Function Prior Level of Function : Independent/Modified Independent             Mobility Comments: ind no device ADLs Comments: drives     Extremity/Trunk Assessment   Upper Extremity Assessment Upper Extremity Assessment: Overall WFL for tasks assessed    Lower Extremity Assessment Lower Extremity Assessment: Overall WFL for tasks assessed    Cervical / Trunk Assessment Cervical / Trunk Assessment: Normal  Communication   Communication Communication: No apparent difficulties  Cognition Arousal: Alert Behavior During Therapy: WFL for tasks assessed/performed Overall Cognitive Status: Within Functional Limits for tasks assessed  General Comments      Exercises     Assessment/Plan    PT Assessment Patient does not need any further PT services  PT Problem List         PT Treatment Interventions      PT Goals (Current goals can be found in the Care Plan section)  Acute Rehab PT Goals Patient Stated Goal: home PT Goal Formulation: All assessment and education complete, DC therapy    Frequency       Co-evaluation               AM-PAC PT "6 Clicks" Mobility  Outcome Measure Help needed turning from your back to your side while in a flat bed without using bedrails?: None Help needed moving from lying on your back to sitting on the  side of a flat bed without using bedrails?: None Help needed moving to and from a bed to a chair (including a wheelchair)?: None Help needed standing up from a chair using your arms (e.g., wheelchair or bedside chair)?: None Help needed to walk in hospital room?: None Help needed climbing 3-5 steps with a railing? : None 6 Click Score: 24    End of Session Equipment Utilized During Treatment: Gait belt Activity Tolerance: Patient tolerated treatment well Patient left: in bed;with call bell/phone within reach;with family/visitor present        Time: 1610-9604 PT Time Calculation (min) (ACUTE ONLY): 9 min   Charges:   PT Evaluation $PT Eval Low Complexity: 1 Low   PT General Charges $$ ACUTE PT VISIT: 1 Visit            Faye Ramsay, PT Acute Rehabilitation  Office: [phone]

## 2023-05-09 NOTE — Discharge Summary (Signed)
Physician Discharge Summary   Patient: Derek [name] MRN: [id number] DOB: [birth year]  Admit date:     05/06/2023  Discharge date: 05/09/23  Discharge Physician: Marguerita Merles, DO   PCP: Assunta Found, MD   Recommendations at discharge:  {Tip this will not be part of the note when signed- Example include specific recommendations for outpatient follow-up, pending tests to follow-up on. (Optional):26781}  ***  Discharge Diagnoses: Principal Problem:   Gastrointestinal hemorrhage with melena Active Problems:   CORONARY ATHEROSCLEROSIS NATIVE CORONARY ARTERY   Stage 3b chronic kidney disease (CKD) (HCC)   Thrombocytopenia (HCC)   GERD (gastroesophageal reflux disease)   Paroxysmal atrial fibrillation (HCC)   Essential hypertension   Hypothyroidism   Duodenal ulcer  Resolved Problems:   * No resolved hospital problems. [facility] Course: The patient is a 82 year old Caucasian male with a past medical history significant for but limited to anemia, anxiety, aortic insufficiency, aortic ascending aneurysm, proximal atrial fibrillation on anticoagulant with Eliquis, heart murmur, hypertension, left ventricular dysfunction, CAD, osteoarthritis, B12 deficiency, history of colon cancer, history of Crohn's disease, history of bowel perforation, history of small bowel obstruction, colonic ischemia, history of pancreatitis, history of GERD, history of high output ileostomy and ileostomy reversal, stage IIIb kidney disease as well as other comorbidities who presented to the ED with multiple episodes of melena associate with dizziness, mild dyspnea and generalized weakness.  He initially had been made n.p.o. and GI been consulted and they are planning EGD and now they are planning it for 05/08/2023 so he will be on a clear liquid diet.  His anticoagulation has been held.  Currently getting very low-dose maintenance IV fluids at 50 mL/h but will stop and give him 1 unit of pRBCs.   Assessment and  Plan:  ABLA in the setting of Melena with associated Dizziness, Weakness and Slight Dyspnea from a Duodenal Ulcer -Baseline hemoglobin is around 10-11 and patient admits to fatigue and he is on anticoagulation with Eliquis -Hgb/Hct Trend: Recent Labs  Lab 05/06/23 1403 05/06/23 2327 05/07/23 0449 05/07/23 1534 05/08/23 0526 05/08/23 1416 05/09/23 0513  HGB 9.4* 8.1* 7.9* 8.3* 7.5* 7.3* 8.2*  HCT 30.5* 26.2* 25.2* 26.7* 24.8* 23.7* 25.6*  MCV 96.8  --  95.1  --  98.0 97.5 94.8  -Checked Anemia Panel i and it showed an iron level of 79, UIBC 371, TIBC 458, saturation ratios of 17%, ferritin level 20, folate level is 35.7 and vitamin B12 89 -Will start vitamin B12 supplementation with 1000 mcg and got a x1 IM shot and will start po supplementation 1000 mcg po Daily  -Continue to hold Eliquis -Given dropping Hgb will Type and Screen and transfuse 1 unit of prRBC today -He was initiated on a pantoprazole drip and will need BID PPI at D/C  -Continue to Monitor for S/Sx of Bleeding; -EGD done and showed "The esophagus was normal. The stomach was normal. Biopsies were taken with a cold forceps from the gastric antrum for histology (rule out H. pylori). One non-bleeding cratered duodenal ulcer with no stigmata of bleeding was found in the duodenal bulb. The lesion was 8 mm in largest dimension. The examined duodenum was otherwise normal.The cardia and gastric fundus were normal on retroflexion." -GI recommends changing the PPI to 40 mg p.o. twice daily for 1 month and then 40 mg p.o. daily as well as resuming regular diet and resuming Eliquis upon discharge as well as awaiting pathology results -Repeat CBC in the AM  CORONARY ATHEROSCLEROSIS  NATIVE CORONARY ARTERY -Continue statin. -Hold anticoagulation. -Monitor for any CP   Thrombocytopenia (HCC) -Platelet Count Trend: Recent Labs  Lab 05/06/23 1403 05/07/23 0449 05/08/23 0526 05/08/23 1416 05/09/23 0513  PLT 145* 108* 115* 102*  101*  -Continue to Monitor and Trend -Repeat CBC in the AM   Paroxysmal Atrial Fibrillation (HCC) -Hold DOA for now and resume at D/C -Continue Amiodarone 200 mg p.o. twice daily. -Continue to Monitor on Telemetry  Hyperkalemia -Patient's K+ Level Trend: Recent Labs  Lab 05/06/23 1403 05/07/23 0449 05/08/23 0526 05/09/23 0513  K 5.3* 5.8* 5.2* 4.7  -Give Lokelma 10 mg x1 -Continue to Monitor and Replete as Necessary -Repeat CMP in the AM    Essential Hypertension -Hold antihypertensives due to blood loss. -Continue to Monitor BP per Protocol -Last BP reading was 111/52   Hypothyroidism -Continue Levothyroxine 25 mcg p.o. daily.  Generalized Weakness -In the setting of his acute blood loss anemia -Monitor for further blood loss and he is on gentle IV fluid hydration at 50 cc/h -Obtain PT OT to further evaluate and treat in the a.m.  CKD Stage 3b -BUN/Cr Trend: Recent Labs  Lab 05/06/23 1403 05/07/23 0449 05/08/23 0526 05/09/23 0513  BUN 33* 30* 22 20  CREATININE 2.11* 2.15* 2.16* 2.23*  -Will discontinue IVF with NS at 50 mL/hr given that we will get 1 unit of pRBC -C/w Sodium Bicarbonate 1950 mg po BID -Avoid Nephrotoxic Medications, Contrast Dyes, Hypotension and Dehydration to Ensure Adequate Renal Perfusion and will need to Renally Adjust Meds -Continue to Monitor and Trend Renal Function carefully and repeat CMP in the AM   HLD -C/w Rosuvastatin 5 mg po Daily  Pancytopenia -CBC Trend: Recent Labs  Lab 05/06/23 1403 05/06/23 2327 05/07/23 0449 05/07/23 1534 05/08/23 0526 05/08/23 1416 05/09/23 0513  WBC 4.4  --  3.8*  --  3.1* 2.8* 2.9*  HGB 9.4*   < > 7.9*   < > 7.5* 7.3* 8.2*  HCT 30.5*   < > 25.2*   < > 24.8* 23.7* 25.6*  MCV 96.8  --  95.1  --  98.0 97.5 94.8  PLT 145*  --  108*  --  115* 102* 101*   < > = values in this interval not displayed.  -Continue to Monitor and Trend and repeat CBC in the AM  Hypoalbuminemia -Patient's Albumin  Trend: Recent Labs  Lab 05/06/23 1403 05/07/23 0449 05/08/23 0526 05/09/23 0513  ALBUMIN 3.3* 3.1* 3.2* 3.2*  -Continue to Monitor and Trend and repeat CMP in the AM  Overweight -Complicates overall prognosis and care -Estimated body mass index is 27.55 kg/m as calculated from the following:   Height as of this encounter: 5\' 10"  (1.778 m).   Weight as of this encounter: 87.1 kg.  -Weight Loss and Dietary Counseling given   Assessment and Plan: No notes have been filed under this hospital service. Service: Hospitalist     {Tip this will not be part of the note when signed Body mass index is 27.55 kg/m. , ,  (Optional):26781}  {(NOTE) Pain control PDMP Statment (Optional):26782} Consultants: *** Procedures performed: ***  Disposition: {Plan; Disposition:26390} Diet recommendation:  Discharge Diet Orders (From admission, onward)     Start     Ordered   05/09/23 0000  Diet - low sodium heart healthy        05/09/23 1245           {Diet_Plan:26776} DISCHARGE MEDICATION: Allergies as of 05/09/2023  Reactions   Lorazepam Other (See Comments)   Hallucinations    Other Other (See Comments)   Cannot have ct scans with dye due to kidney function   Humira [adalimumab] Other (See Comments)   Pt states that he got pancreatitis because of this    Colchicine Other (See Comments)   Cramps   Quinolones Other (See Comments)   Patient was warned about not using Cipro and similar antibiotics. Recent studies have raised concern that fluoroquinolone antibiotics could be associated with an increased risk of aortic aneurysm Fluoroquinolones have non-antimicrobial properties that might jeopardise the integrity of the extracellular matrix of the vascular wall In a  propensity score matched cohort study in [place], there was a 66% increased rate of aortic aneurysm or dissection associated with oral fluoroquinolone use, compared wit        Medication List     STOP taking  these medications    hydrALAZINE 25 MG tablet Commonly known as: APRESOLINE   lidocaine 5 % Commonly known as: Lidoderm       TAKE these medications    Acidophilus Lactobacillus Caps Take 1 capsule by mouth daily.   amiodarone 200 MG tablet Commonly known as: PACERONE Take 1 tablet (200 mg total) by mouth daily.   calcium carbonate 500 MG chewable tablet Commonly known as: TUMS - dosed in mg elemental calcium Chew 2 tablets by mouth 2 (two) times daily as needed for indigestion or heartburn.   cyanocobalamin 1000 MCG tablet Take 1 tablet (1,000 mcg total) by mouth daily.   Eliquis 5 MG Tabs tablet Generic drug: apixaban Take 1 tablet (5 mg total) by mouth 2 (two) times daily. What changed:  how much to take when to take this   famotidine 40 MG tablet Commonly known as: PEPCID Take 40 mg by mouth daily before breakfast.   fluticasone 50 MCG/ACT nasal spray Commonly known as: FLONASE Place 1 spray into both nostrils 2 (two) times daily as needed for allergies or rhinitis.   HYDROcodone-acetaminophen 10-325 MG tablet Commonly known as: NORCO Take 1 tablet by mouth 4 (four) times daily as needed (FOR PAIN).   levothyroxine 25 MCG tablet Commonly known as: SYNTHROID Take 25 mcg by mouth daily before breakfast.   Magnesium Oxide 420 MG Tabs Take 1,260 mg by mouth in the morning and at bedtime.   multivitamin with minerals Tabs tablet Take 1 tablet by mouth daily with breakfast.   ondansetron 4 MG tablet Commonly known as: ZOFRAN Take 4 mg by mouth every 8 (eight) hours as needed for nausea or vomiting.   pantoprazole 40 MG tablet Commonly known as: PROTONIX Take 1 tablet (40 mg total) by mouth 2 (two) times daily for 30 days, THEN 1 tablet (40 mg total) daily. Start taking on: May 09, 2023   PARoxetine 40 MG tablet Commonly known as: PAXIL Take 20 mg by mouth in the morning.   rosuvastatin 10 MG tablet Commonly known as: Crestor Take 1 tablet (10 mg  total) by mouth daily. What changed: how much to take   sodium bicarbonate 650 MG tablet Take 1,950 mg by mouth in the morning and at bedtime.   sucralfate 1 GM/10ML suspension Commonly known as: CARAFATE Take 1 g by mouth at bedtime as needed (to coat the stomach).   traZODone 150 MG tablet Commonly known as: DESYREL Take 150 mg by mouth at bedtime.   TYLENOL 500 MG tablet Generic drug: acetaminophen Take 1,000 mg by mouth every 6 (six) hours as  needed for mild pain or headache.   Uloric 80 MG Tabs Generic drug: Febuxostat Take 80 mg by mouth in the morning.   Vitamin D3 50 MCG (2000 UT) Tabs Take 2,000 Units by mouth daily.        Discharge Exam: Filed Weights   05/06/23 1350 05/08/23 0909  Weight: 87.1 kg 87.1 kg   ***  Condition at discharge: {DC Condition:26389}  The results of significant diagnostics from this hospitalization (including imaging, microbiology, ancillary and laboratory) are listed below for reference.   Imaging Studies: No results found.  Microbiology: Results for orders placed or performed during the hospital encounter of 03/29/21  Blood Culture (routine x 2)     Status: None   Collection Time: 03/29/21  2:45 AM   Specimen: Right Antecubital; Blood  Result Value Ref Range Status   Specimen Description RIGHT ANTECUBITAL  Final   Special Requests   Final    BOTTLES DRAWN AEROBIC AND ANAEROBIC Blood Culture results may not be optimal due to an excessive volume of blood received in culture bottles   Culture   Final    NO GROWTH 5 DAYS Performed at [facility], [address] [place], [place] [address]    Report Status 04/03/2021 FINAL  Final  Blood Culture (routine x 2)     Status: None   Collection Time: 03/29/21  2:47 AM   Specimen: BLOOD LEFT HAND  Result Value Ref Range Status   Specimen Description BLOOD LEFT HAND  Final   Special Requests   Final    BOTTLES DRAWN AEROBIC AND ANAEROBIC Blood Culture adequate volume   Culture    Final    NO GROWTH 5 DAYS Performed at [facility], [address] [place], [place] [address]    Report Status 04/03/2021 FINAL  Final  Resp Panel by RT-PCR (Flu A&B, Covid) Nasopharyngeal Swab     Status: Abnormal   Collection Time: 03/29/21  3:42 AM   Specimen: Nasopharyngeal Swab; Nasopharyngeal(NP) swabs in vial transport medium  Result Value Ref Range Status   SARS Coronavirus 2 by RT PCR POSITIVE (A) NEGATIVE Final    Comment: RESULT CALLED TO, READ BACK BY AND VERIFIED WITH: EVANS,H @ 0444 ON 03/29/21 BY JUW (NOTE) SARS-CoV-2 target nucleic acids are DETECTED.  The SARS-CoV-2 RNA is generally detectable in upper respiratory specimens during the acute phase of infection. Positive results are indicative of the presence of the identified virus, but do not rule out bacterial infection or co-infection with other pathogens not detected by the test. Clinical correlation with patient history and other diagnostic information is necessary to determine patient infection status. The expected result is Negative.  Fact Sheet for Patients: BloggerCourse.com  Fact Sheet for Healthcare Providers: SeriousBroker.it  This test is not yet approved or cleared by the [place] FDA and  has been authorized for detection and/or diagnosis of SARS-CoV-2 by FDA under an Emergency Use Authorization (EUA).  This EUA will remain in effect (meaning this test can be  used) for the duration of  the COVID-19 declaration under Section 564(b)(1) of the Act, 21 U.S.C. section 360bbb-3(b)(1), unless the authorization is terminated or revoked sooner.     Influenza A by PCR NEGATIVE NEGATIVE Final   Influenza B by PCR NEGATIVE NEGATIVE Final    Comment: (NOTE) The Xpert Xpress SARS-CoV-2/FLU/RSV plus assay is intended as an aid in the diagnosis of influenza from Nasopharyngeal swab specimens and should not be used as a sole basis for treatment. Nasal  washings and aspirates are unacceptable for Xpert Xpress SARS-CoV-2/FLU/RSV testing.  Fact Sheet for Patients: BloggerCourse.com  Fact Sheet for Healthcare Providers: SeriousBroker.it  This test is not yet approved or cleared by the [place] FDA and has been authorized for detection and/or diagnosis of SARS-CoV-2 by FDA under an Emergency Use Authorization (EUA). This EUA will remain in effect (meaning this test can be used) for the duration of the COVID-19 declaration under Section 564(b)(1) of the Act, 21 U.S.C. section 360bbb-3(b)(1), unless the authorization is terminated or revoked.  Performed at [facility], [address], [place], [place] [address]     Labs: CBC: Recent Labs  Lab 05/06/23 1403 05/06/23 2327 05/07/23 0449 05/07/23 1534 05/08/23 0526 05/08/23 1416 05/09/23 0513  WBC 4.4  --  3.8*  --  3.1* 2.8* 2.9*  NEUTROABS  --   --   --   --  2.2 1.9 2.0  HGB 9.4*   < > 7.9* 8.3* 7.5* 7.3* 8.2*  HCT 30.5*   < > 25.2* 26.7* 24.8* 23.7* 25.6*  MCV 96.8  --  95.1  --  98.0 97.5 94.8  PLT 145*  --  108*  --  115* 102* 101*   < > = values in this interval not displayed.   Basic Metabolic Panel: Recent Labs  Lab 05/06/23 1403 05/07/23 0449 05/08/23 0526 05/09/23 0513  NA 140 139 138 141  K 5.3* 5.8* 5.2* 4.7  CL 113* 108 108 109  CO2 22 24 21* 24  GLUCOSE 98 107* 103* 110*  BUN 33* 30* 22 20  CREATININE 2.11* 2.15* 2.16* 2.23*  CALCIUM 8.3* 8.3* 8.1* 8.1*  MG  --   --  1.8 1.6*  PHOS  --   --  3.9 4.7*   Liver Function Tests: Recent Labs  Lab 05/06/23 1403 05/07/23 0449 05/08/23 0526 05/09/23 0513  AST 29 50* 39 32  ALT 25 40 38 33  ALKPHOS 102 95 102 102  BILITOT 0.2* 0.8 0.7 0.9  PROT 5.8* 5.2* 5.2* 5.4*  ALBUMIN 3.3* 3.1* 3.2* 3.2*   CBG: Recent Labs  Lab 05/08/23 0527  GLUCAP 93    Discharge time spent: {LESS THAN/GREATER THAN:26388} 30 minutes.  Signed: Merlene Laughter,  DO [facility] 05/09/2023

## 2023-05-09 NOTE — Progress Notes (Signed)
OT Cancellation Note  Patient Details Name: Derek [name] MRN: [id number] DOB: Feb 10, 1941   Cancelled Treatment:    Reason Eval/Treat Not Completed: OT screened, no needs identified, will sign off.   Reuben Likes 05/09/2023, 3:31 PM

## 2023-05-10 LAB — TYPE AND SCREEN
ABO/RH(D): O POS
Antibody Screen: NEGATIVE
Unit division: 0

## 2023-05-10 LAB — BPAM RBC
Blood Product Expiration Date: 202411022359
ISSUE DATE / TIME: 202410051817
Unit Type and Rh: 5100

## 2023-05-11 ENCOUNTER — Encounter (HOSPITAL_COMMUNITY): Payer: Self-pay | Admitting: Internal Medicine

## 2023-05-11 LAB — SURGICAL PATHOLOGY

## 2023-05-11 NOTE — Telephone Encounter (Signed)
Pt wife [name] stated that pt was recently discharged from the hospital and was notified to follow up with his Gastroenterologist in 1-2 weeks. Pt was hospitalized for a GI bleed.  Pt was scheduled for a follow up appointment with Dr. Leone Payor on 05/18/2023 at 1:50 PM.  [name] made aware. [name] verbalized understanding with all questions answered.

## 2023-05-11 NOTE — Telephone Encounter (Signed)
Inbound call from patient's wife, stating he went to the ED and was told to follow up in 1-2 weeks with Dr. Leone Payor. Please advise.

## 2023-05-13 ENCOUNTER — Other Ambulatory Visit: Payer: Self-pay | Admitting: Thoracic Surgery (Cardiothoracic Vascular Surgery)

## 2023-05-13 DIAGNOSIS — I7121 Aneurysm of the ascending aorta, without rupture: Secondary | ICD-10-CM

## 2023-05-16 ENCOUNTER — Other Ambulatory Visit: Payer: Self-pay

## 2023-05-16 ENCOUNTER — Inpatient Hospital Stay (HOSPITAL_COMMUNITY)
Admission: EM | Admit: 2023-05-16 | Discharge: 2023-05-19 | DRG: 389 | Disposition: A | Discharge status: Home / Self Care | Payer: No Typology Code available for payment source | Attending: Family Medicine | Admitting: Family Medicine

## 2023-05-16 ENCOUNTER — Encounter (HOSPITAL_COMMUNITY): Payer: Self-pay | Admitting: Internal Medicine

## 2023-05-16 ENCOUNTER — Emergency Department (HOSPITAL_COMMUNITY): Payer: No Typology Code available for payment source

## 2023-05-16 ENCOUNTER — Observation Stay (HOSPITAL_COMMUNITY): Payer: No Typology Code available for payment source

## 2023-05-16 DIAGNOSIS — G2581 Restless legs syndrome: Secondary | ICD-10-CM | POA: Diagnosis present

## 2023-05-16 DIAGNOSIS — Z96612 Presence of left artificial shoulder joint: Secondary | ICD-10-CM | POA: Diagnosis present

## 2023-05-16 DIAGNOSIS — K319 Disease of stomach and duodenum, unspecified: Secondary | ICD-10-CM | POA: Diagnosis present

## 2023-05-16 DIAGNOSIS — R1084 Generalized abdominal pain: Secondary | ICD-10-CM

## 2023-05-16 DIAGNOSIS — I480 Paroxysmal atrial fibrillation: Secondary | ICD-10-CM | POA: Diagnosis present

## 2023-05-16 DIAGNOSIS — Z85038 Personal history of other malignant neoplasm of large intestine: Secondary | ICD-10-CM

## 2023-05-16 DIAGNOSIS — D696 Thrombocytopenia, unspecified: Secondary | ICD-10-CM | POA: Diagnosis present

## 2023-05-16 DIAGNOSIS — F419 Anxiety disorder, unspecified: Secondary | ICD-10-CM | POA: Diagnosis present

## 2023-05-16 DIAGNOSIS — Z860101 Personal history of adenomatous and serrated colon polyps: Secondary | ICD-10-CM

## 2023-05-16 DIAGNOSIS — D649 Anemia, unspecified: Secondary | ICD-10-CM | POA: Diagnosis present

## 2023-05-16 DIAGNOSIS — G4700 Insomnia, unspecified: Secondary | ICD-10-CM | POA: Diagnosis present

## 2023-05-16 DIAGNOSIS — Z8719 Personal history of other diseases of the digestive system: Secondary | ICD-10-CM

## 2023-05-16 DIAGNOSIS — F4310 Post-traumatic stress disorder, unspecified: Secondary | ICD-10-CM | POA: Diagnosis present

## 2023-05-16 DIAGNOSIS — B182 Chronic viral hepatitis C: Secondary | ICD-10-CM | POA: Diagnosis present

## 2023-05-16 DIAGNOSIS — N184 Chronic kidney disease, stage 4 (severe): Secondary | ICD-10-CM | POA: Diagnosis present

## 2023-05-16 DIAGNOSIS — I2510 Atherosclerotic heart disease of native coronary artery without angina pectoris: Secondary | ICD-10-CM | POA: Diagnosis present

## 2023-05-16 DIAGNOSIS — K219 Gastro-esophageal reflux disease without esophagitis: Secondary | ICD-10-CM | POA: Diagnosis present

## 2023-05-16 DIAGNOSIS — Z841 Family history of disorders of kidney and ureter: Secondary | ICD-10-CM

## 2023-05-16 DIAGNOSIS — K269 Duodenal ulcer, unspecified as acute or chronic, without hemorrhage or perforation: Secondary | ICD-10-CM | POA: Diagnosis not present

## 2023-05-16 DIAGNOSIS — Z8614 Personal history of Methicillin resistant Staphylococcus aureus infection: Secondary | ICD-10-CM

## 2023-05-16 DIAGNOSIS — Z79899 Other long term (current) drug therapy: Secondary | ICD-10-CM

## 2023-05-16 DIAGNOSIS — Z8249 Family history of ischemic heart disease and other diseases of the circulatory system: Secondary | ICD-10-CM

## 2023-05-16 DIAGNOSIS — N2581 Secondary hyperparathyroidism of renal origin: Secondary | ICD-10-CM | POA: Diagnosis present

## 2023-05-16 DIAGNOSIS — Z9049 Acquired absence of other specified parts of digestive tract: Secondary | ICD-10-CM

## 2023-05-16 DIAGNOSIS — E039 Hypothyroidism, unspecified: Secondary | ICD-10-CM | POA: Diagnosis present

## 2023-05-16 DIAGNOSIS — K5080 Crohn's disease of both small and large intestine without complications: Secondary | ICD-10-CM | POA: Diagnosis present

## 2023-05-16 DIAGNOSIS — M109 Gout, unspecified: Secondary | ICD-10-CM | POA: Diagnosis present

## 2023-05-16 DIAGNOSIS — I129 Hypertensive chronic kidney disease with stage 1 through stage 4 chronic kidney disease, or unspecified chronic kidney disease: Secondary | ICD-10-CM | POA: Diagnosis present

## 2023-05-16 DIAGNOSIS — M170 Bilateral primary osteoarthritis of knee: Secondary | ICD-10-CM | POA: Diagnosis present

## 2023-05-16 DIAGNOSIS — K56609 Unspecified intestinal obstruction, unspecified as to partial versus complete obstruction: Principal | ICD-10-CM | POA: Diagnosis present

## 2023-05-16 DIAGNOSIS — R739 Hyperglycemia, unspecified: Secondary | ICD-10-CM

## 2023-05-16 DIAGNOSIS — N1832 Chronic kidney disease, stage 3b: Secondary | ICD-10-CM

## 2023-05-16 DIAGNOSIS — I4891 Unspecified atrial fibrillation: Secondary | ICD-10-CM | POA: Diagnosis present

## 2023-05-16 DIAGNOSIS — Z8616 Personal history of COVID-19: Secondary | ICD-10-CM

## 2023-05-16 DIAGNOSIS — Z79890 Hormone replacement therapy (postmenopausal): Secondary | ICD-10-CM

## 2023-05-16 DIAGNOSIS — I7121 Aneurysm of the ascending aorta, without rupture: Secondary | ICD-10-CM | POA: Diagnosis present

## 2023-05-16 DIAGNOSIS — Z7901 Long term (current) use of anticoagulants: Secondary | ICD-10-CM

## 2023-05-16 DIAGNOSIS — J8410 Pulmonary fibrosis, unspecified: Secondary | ICD-10-CM | POA: Diagnosis present

## 2023-05-16 DIAGNOSIS — Z87891 Personal history of nicotine dependence: Secondary | ICD-10-CM

## 2023-05-16 DIAGNOSIS — H9190 Unspecified hearing loss, unspecified ear: Secondary | ICD-10-CM | POA: Diagnosis present

## 2023-05-16 DIAGNOSIS — K565 Intestinal adhesions [bands], unspecified as to partial versus complete obstruction: Secondary | ICD-10-CM | POA: Diagnosis not present

## 2023-05-16 DIAGNOSIS — N2589 Other disorders resulting from impaired renal tubular function: Secondary | ICD-10-CM | POA: Diagnosis present

## 2023-05-16 DIAGNOSIS — D123 Benign neoplasm of transverse colon: Secondary | ICD-10-CM | POA: Diagnosis present

## 2023-05-16 DIAGNOSIS — Z825 Family history of asthma and other chronic lower respiratory diseases: Secondary | ICD-10-CM

## 2023-05-16 DIAGNOSIS — K267 Chronic duodenal ulcer without hemorrhage or perforation: Secondary | ICD-10-CM | POA: Diagnosis present

## 2023-05-16 DIAGNOSIS — Z1152 Encounter for screening for COVID-19: Secondary | ICD-10-CM

## 2023-05-16 DIAGNOSIS — N289 Disorder of kidney and ureter, unspecified: Secondary | ICD-10-CM

## 2023-05-16 DIAGNOSIS — Z87442 Personal history of urinary calculi: Secondary | ICD-10-CM

## 2023-05-16 DIAGNOSIS — Z85828 Personal history of other malignant neoplasm of skin: Secondary | ICD-10-CM

## 2023-05-16 DIAGNOSIS — Z86718 Personal history of other venous thrombosis and embolism: Secondary | ICD-10-CM

## 2023-05-16 LAB — COMPREHENSIVE METABOLIC PANEL
ALT: 28 U/L (ref 0–44)
AST: 29 U/L (ref 15–41)
Albumin: 3.6 g/dL (ref 3.5–5.0)
Alkaline Phosphatase: 132 U/L — ABNORMAL HIGH (ref 38–126)
Anion gap: 10 (ref 5–15)
BUN: 17 mg/dL (ref 8–23)
CO2: 23 mmol/L (ref 22–32)
Calcium: 8.7 mg/dL — ABNORMAL LOW (ref 8.9–10.3)
Chloride: 105 mmol/L (ref 98–111)
Creatinine, Ser: 2.19 mg/dL — ABNORMAL HIGH (ref 0.61–1.24)
GFR, Estimated: 30 mL/min — ABNORMAL LOW (ref >60)
Glucose, Bld: 123 mg/dL — ABNORMAL HIGH (ref 70–99)
Potassium: 4.9 mmol/L (ref 3.5–5.1)
Sodium: 138 mmol/L (ref 135–145)
Total Bilirubin: 0.6 mg/dL (ref 0.3–1.2)
Total Protein: 6.1 g/dL — ABNORMAL LOW (ref 6.5–8.1)

## 2023-05-16 LAB — CBC WITH DIFFERENTIAL/PLATELET
Abs Immature Granulocytes: 0.02 10*3/uL (ref 0.00–0.07)
Basophils Absolute: 0 10*3/uL (ref 0.0–0.1)
Basophils Relative: 1 %
Eosinophils Absolute: 0.1 10*3/uL (ref 0.0–0.5)
Eosinophils Relative: 2 %
HCT: 29.1 % — ABNORMAL LOW (ref 39.0–52.0)
Hemoglobin: 9.3 g/dL — ABNORMAL LOW (ref 13.0–17.0)
Immature Granulocytes: 0 %
Lymphocytes Relative: 11 %
Lymphs Abs: 0.5 10*3/uL — ABNORMAL LOW (ref 0.7–4.0)
MCH: 29.8 pg (ref 26.0–34.0)
MCHC: 32 g/dL (ref 30.0–36.0)
MCV: 93.3 fL (ref 80.0–100.0)
Monocytes Absolute: 0.2 10*3/uL (ref 0.1–1.0)
Monocytes Relative: 5 %
Neutro Abs: 3.8 10*3/uL (ref 1.7–7.7)
Neutrophils Relative %: 81 %
Platelets: 129 10*3/uL — ABNORMAL LOW (ref 150–400)
RBC: 3.12 MIL/uL — ABNORMAL LOW (ref 4.22–5.81)
RDW: 15.9 % — ABNORMAL HIGH (ref 11.5–15.5)
WBC: 4.6 10*3/uL (ref 4.0–10.5)
nRBC: 0 % (ref 0.0–0.2)

## 2023-05-16 LAB — URINALYSIS, ROUTINE W REFLEX MICROSCOPIC
Bilirubin Urine: NEGATIVE
Glucose, UA: NEGATIVE mg/dL
Hgb urine dipstick: NEGATIVE
Ketones, ur: NEGATIVE mg/dL
Leukocytes,Ua: NEGATIVE
Nitrite: NEGATIVE
Protein, ur: NEGATIVE mg/dL
Specific Gravity, Urine: 1.013 (ref 1.005–1.030)
pH: 7 (ref 5.0–8.0)

## 2023-05-16 LAB — I-STAT CG4 LACTIC ACID, ED: Lactic Acid, Venous: 2 mmol/L (ref 0.5–1.9)

## 2023-05-16 LAB — MAGNESIUM: Magnesium: 1.7 mg/dL (ref 1.7–2.4)

## 2023-05-16 LAB — LIPASE, BLOOD: Lipase: 33 U/L (ref 11–51)

## 2023-05-16 MED ORDER — ONDANSETRON HCL 4 MG/2ML IJ SOLN
4.0000 mg | Freq: Four times a day (QID) | INTRAMUSCULAR | Status: DC | PRN
  Administered 2023-05-16 – 2023-05-17 (×3): 4 mg via INTRAVENOUS
  Filled 2023-05-16 (×3): qty 2

## 2023-05-16 MED ORDER — APIXABAN 2.5 MG PO TABS
2.5000 mg | ORAL_TABLET | Freq: Two times a day (BID) | ORAL | Status: DC
  Administered 2023-05-16 (×2): 2.5 mg via ORAL
  Filled 2023-05-16 (×2): qty 1

## 2023-05-16 MED ORDER — SODIUM BICARBONATE 650 MG PO TABS
1950.0000 mg | ORAL_TABLET | Freq: Two times a day (BID) | ORAL | Status: DC
  Administered 2023-05-16 – 2023-05-17 (×3): 1950 mg via ORAL
  Filled 2023-05-16 (×3): qty 3

## 2023-05-16 MED ORDER — HYDROMORPHONE HCL 1 MG/ML IJ SOLN
0.5000 mg | INTRAMUSCULAR | Status: DC | PRN
  Administered 2023-05-16 – 2023-05-17 (×2): 0.5 mg via INTRAVENOUS
  Filled 2023-05-16 (×2): qty 0.5

## 2023-05-16 MED ORDER — ACETAMINOPHEN 650 MG RE SUPP: 650.0000 mg | Freq: Four times a day (QID) | RECTAL | Status: DC | PRN

## 2023-05-16 MED ORDER — ONDANSETRON HCL 4 MG/2ML IJ SOLN
4.0000 mg | Freq: Once | INTRAMUSCULAR | Status: DC
  Filled 2023-05-16: qty 2

## 2023-05-16 MED ORDER — AMIODARONE HCL 200 MG PO TABS
200.0000 mg | ORAL_TABLET | Freq: Every day | ORAL | Status: DC
  Administered 2023-05-16 – 2023-05-19 (×4): 200 mg via ORAL
  Filled 2023-05-16 (×4): qty 1

## 2023-05-16 MED ORDER — MORPHINE SULFATE (PF) 4 MG/ML IV SOLN
4.0000 mg | Freq: Once | INTRAVENOUS | Status: DC
  Filled 2023-05-16: qty 1

## 2023-05-16 MED ORDER — PROCHLORPERAZINE EDISYLATE 10 MG/2ML IJ SOLN
10.0000 mg | Freq: Once | INTRAMUSCULAR | Status: AC
  Administered 2023-05-16: 10 mg via INTRAVENOUS
  Filled 2023-05-16: qty 2

## 2023-05-16 MED ORDER — PANTOPRAZOLE SODIUM 40 MG PO TBEC
40.0000 mg | DELAYED_RELEASE_TABLET | Freq: Two times a day (BID) | ORAL | Status: DC
  Administered 2023-05-16 – 2023-05-17 (×3): 40 mg via ORAL
  Filled 2023-05-16 (×3): qty 1

## 2023-05-16 MED ORDER — MORPHINE SULFATE (PF) 4 MG/ML IV SOLN
4.0000 mg | Freq: Once | INTRAVENOUS | Status: AC
  Administered 2023-05-16: 4 mg via INTRAMUSCULAR
  Filled 2023-05-16: qty 1

## 2023-05-16 MED ORDER — PANTOPRAZOLE SODIUM 40 MG PO TBEC: 40.0000 mg | DELAYED_RELEASE_TABLET | Freq: Every day | ORAL | Status: DC

## 2023-05-16 MED ORDER — LACTATED RINGERS IV SOLN: INTRAVENOUS | Status: AC

## 2023-05-16 MED ORDER — HYDROMORPHONE HCL 1 MG/ML IJ SOLN
0.5000 mg | Freq: Once | INTRAMUSCULAR | Status: AC
  Administered 2023-05-16: 0.5 mg via INTRAVENOUS
  Filled 2023-05-16: qty 0.5

## 2023-05-16 MED ORDER — ONDANSETRON 8 MG PO TBDP
8.0000 mg | ORAL_TABLET | Freq: Once | ORAL | Status: AC
  Administered 2023-05-16: 8 mg via ORAL
  Filled 2023-05-16: qty 1

## 2023-05-16 MED ORDER — ONDANSETRON HCL 4 MG PO TABS: 4.0000 mg | ORAL_TABLET | Freq: Four times a day (QID) | ORAL | Status: DC | PRN

## 2023-05-16 MED ORDER — ACETAMINOPHEN 325 MG PO TABS: 650.0000 mg | ORAL_TABLET | Freq: Four times a day (QID) | ORAL | Status: DC | PRN

## 2023-05-16 MED ORDER — MORPHINE SULFATE (PF) 4 MG/ML IV SOLN
4.0000 mg | Freq: Once | INTRAVENOUS | Status: AC
  Administered 2023-05-16: 4 mg via INTRAVENOUS
  Filled 2023-05-16: qty 1

## 2023-05-16 NOTE — Consult Note (Signed)
[facility] Consult Note   History Derek [name] MRN # [id number]  Date of Admission: 05/16/2023 Date of Consultation: 05/16/2023 Referring physician: Dr. Barnetta Chapel, MD Primary Care Provider: Assunta Found, MD Primary Gastroenterologist: Dr. Stan Head   Reason for Consultation/Chief Complaint: Small bowel obstruction  Subjective  HPI:  This is an 82 year old man known to Dr. Leone Payor for history of ileocolonic Crohn's disease under good control after surgery in 2017, little if any activity and last colonoscopy in 2022, currently on no Crohn's treatment, history of colon cancer status post resection who came to the ED last night for progressive abdominal pain and distention with inability to pass gas throughout the evening yesterday.  His wife called me at about 11:30 PM last night with the reported symptoms and said he had a history of SBO.  I recommended going to the ED, he was evaluated and found to have a high-grade SBO on CT scan that is not centered around his ileocolonic anastomosis.  For unclear reasons he has not yet had an NG tube placed, so I have just entered those orders and communicated with the hospital physician.  He has not been vomiting but he has generalized abdominal pressure and bloating and no passage of gas or stool since sometime yesterday.  ROS:   All other systems are negative except as noted above in the HPI  Past Medical History Past Medical History:  Diagnosis Date   Anemia    Anxiety    Aortic insufficiency    a. mild-mod by echo 09/2015.   Arthritis    "knees; left shoulder" (09/21/2013) oa   Ascending aortic aneurysm (HCC)    stable less than 5 cm per 11-21-2020 chest ct epic   Atrial fibrillation (HCC)    B12 deficiency    takes Vit 12 shot every 14days    Bowel perforation (HCC) 08/27/2016   CKD (chronic kidney disease) stage 3 B, GFR 30-59 ml/min (HCC) 08/22/2011   dr patel Theron Arista 08-26-2021   Colon cancer [facility]) 2018    surgery   Colonic ischemia (HCC) 08/27/2016   Coronary artery disease    COVID-19 03/29/2021   hospitalized from 03-29-2021 to 03-31-2021   Crohn's disease (HCC)    Enteric hyperoxaluria 02/21/2016   GERD (gastroesophageal reflux disease)    GI bleed    Gout    takes Uloric  daily   Gout flare 09/02/2021   right heel decadron 10 mg im given   Hearing loss    Heart murmur    Hematuria    few weeks ago per pt wife pam on 09-03-2021   Hepatitis C 1978   negtive RNA load - spontaneously cleared   Hiatal hernia    High output ileostomy (HCC) 06/02/2017   History of blood transfusion 1978; 1990's; ?   "w/bowel resection; S/P allupurinol; ?" (09/21/2013)   History of colon polyps    History of dvt 2006   late 1980's earlt 1990's from jumping from plane left leg   History of kidney stones    1980's   History of MRSA infection 2010   History of small bowel obstruction    History of staph infection 1978   Hyperoxaluria    Intestinal   Hypertension    Hypothyroidism    Insomnia    takes Trazodone nightly   Internal hemorrhoids    LV dysfunction    a. h/o EF 45-50% in 2015, normalized on subsequent echoes.   Nephrolithiasis    Nocardia infection  09/2016   resolved   Pancreatitis 2010   elevated lipase and amylase, stranding in tail of pancreas, ? from Humira   Pancytopenia    Hx of   Peripheral neuropathy    takes Gabapentin daily in both legs   Pneumonia    several times last times 2012   PTSD (post-traumatic stress disorder)    takes paxil for   Pulmonary nodule    a. 6mm by CT 05/2015, recommended f/u 6-12 months.   Renal tubular acidosis    RLS (restless legs syndrome)    Rosacea conjunctivitis(372.31)    Secondary hyperparathyroidism (HCC) 02/21/2016   Shingles 2022   head close to eye   Short bowel syndrome    Sinus bradycardia 2018   none since   Skin cancer    "cut/burned off left ear and face" (09/21/2013)   Sleep apnea    Small bowel obstruction (HCC)  1978   Status post reversal of ileostomy 07/23/2017   Thrombocytopenia (HCC)    problems since 1978   Wears glasses reading    Summary of patient's Crohn's, colon cancer and surgical history in Dr. Marvell Fuller most recent office note several months ago. Past Surgical History Past Surgical History:  Procedure Laterality Date   ANKLE SURGERY Right    APPENDECTOMY  1978   BIOPSY  05/08/2023   Procedure: BIOPSY;  Surgeon: Hilarie Fredrickson, MD;  Location: [facility];  Service: Gastroenterology;;   BOWEL RESECTION  1978 X 2   CARDIOVERSION N/A 05/29/2016   Procedure: CARDIOVERSION;  Surgeon: Thurmon Fair, MD;  Location: [facility];  Service: Cardiovascular;  Laterality: N/A;   CHOLECYSTECTOMY     yrs ago   COLON RESECTION N/A 08/14/2016   Procedure: LAPAROSCOPIC RESECTION TRANSVERSE COLON;  Surgeon: Ovidio Kin, MD;  Location: [facility];  Service: General;  Laterality: N/A;   COLON SURGERY  2018   ileostomy   COLONOSCOPY     ESOPHAGOGASTRODUODENOSCOPY     ESOPHAGOGASTRODUODENOSCOPY (EGD) WITH PROPOFOL N/A 05/08/2023   Procedure: ESOPHAGOGASTRODUODENOSCOPY (EGD) WITH PROPOFOL;  Surgeon: Hilarie Fredrickson, MD;  Location: [facility];  Service: Gastroenterology;  Laterality: N/A;   EXTRACORPOREAL SHOCK WAVE LITHOTRIPSY     yrs ago   EYE SURGERY     cataract surgery bilateral   FOOT SURGERY Right    "took gout out"   HEMICOLECTOMY Right    ILEOCECETOMY  1978   /notes 05/10/2000  (09/21/2013)   ILEOSTOMY     ILEOSTOMY CLOSURE N/A 07/23/2017   Procedure: ILEOSTOMY REVERSAL ;  Surgeon: Ovidio Kin, MD;  Location: [facility];  Service: General;  Laterality: N/A;   INGUINAL HERNIA REPAIR Right    IR FLUORO GUIDE CV LINE RIGHT  05/07/2017   IR REMOVAL TUN CV CATH W/O FL  10/08/2017   IR US GUIDE VASC ACCESS RIGHT  05/07/2017   KNEE ARTHROSCOPY Left    LAPAROTOMY N/A 08/27/2016   Procedure: EXPLORATORYLAPAROTOMY, LYSIS OF ADHESIONS, ILEOSTOMY, RIGHT COLECTOMY;  Surgeon: Ovidio Kin, MD;  Location:  [facility];  Service: General;  Laterality: N/A;   LIGAMENT REPAIR Left    POLYPECTOMY     right big toe surgery  06/2021   in [place]   TEE WITHOUT CARDIOVERSION N/A 05/29/2016   Procedure: TRANSESOPHAGEAL ECHOCARDIOGRAM (TEE);  Surgeon: Thurmon Fair, MD;  Location: [facility];  Service: Cardiovascular;  Laterality: N/A;   TOTAL SHOULDER ARTHROPLASTY Left 09/21/2013   Procedure: LEFT TOTAL SHOULDER ARTHROPLASTY;  Surgeon: Senaida Lange, MD;  Location: [facility];  Service: Orthopedics;  Laterality: Left;   WOUND DEBRIDEMENT N/A 09/05/2021   Procedure: CHRONIC ABDOMINAL WOUND EXPLORATION, REMOVAL OF PROLENE STITCH ABCESS;  Surgeon: Stechschulte, Hyman Hopes, MD;  Location: [facility];  Service: General;  Laterality: N/A;    Family History Family History  Problem Relation Age of Onset   Aneurysm Mother    Emphysema Mother        smoked   Kidney disease Father    Hypertension Father    Aneurysm Sister    Esophageal cancer Neg Hx    Stomach cancer Neg Hx    Rectal cancer Neg Hx    Colon cancer Neg Hx     Social History Social History   Socioeconomic History   Marital status: Married    Spouse name: [name]   Number of children: 2   Years of education: Not on file   Highest education level: Not on file  Occupational History   Occupation: Retired  Tobacco Use   Smoking status: Former    Current packs/day: 0.00    Average packs/day: 2.0 packs/day for 20.0 years (40.0 ttl pk-yrs)    Types: Cigarettes    Start date: 08/04/1955    Quit date: 08/04/1975    Years since quitting: 47.8   Smokeless tobacco: Former    Types: Chew    Quit date: 08/03/1978   Tobacco comments:    09/21/2013 "quit smoking in the late 1970's; stopped chewing couple years after I quit smoking"  Vaping Use   Vaping status: Never Used  Substance and Sexual Activity   Alcohol use: No   Drug use: No   Sexual activity: Not Currently  Other Topics Concern   Not on file  Social History Narrative   Married 2  children and 6 grandchildren all local   [employer] 2+ tours [phone] - some care from [place] also   Daily caffeine   Does not exercise regularly   Social Determinants of Health   Financial Resource Strain: Low Risk  (04/08/2022)   Overall Financial Resource Strain (CARDIA)    Difficulty of Paying Living Expenses: Not hard at all  Food Insecurity: No Food Insecurity (05/16/2023)   Hunger Vital Sign    Worried About Running Out of Food in the Last Year: Never true    Ran Out of Food in the Last Year: Never true  Transportation Needs: No Transportation Needs (05/16/2023)   PRAPARE - Administrator, Civil Service (Medical): No    Lack of Transportation (Non-Medical): No  Physical Activity: Not on file  Stress: Not on file  Social Connections: Not on file    Allergies Allergies  Allergen Reactions   Lorazepam Other (See Comments)    Hallucinations    Other Other (See Comments)    Cannot have ct scans with dye due to kidney function   Humira [Adalimumab] Other (See Comments)    Pt states that he got pancreatitis because of this    Colchicine Other (See Comments)    Cramps    Quinolones Other (See Comments)    Patient was warned about not using Cipro and similar antibiotics. Recent studies have raised concern that fluoroquinolone antibiotics could be associated with an increased risk of aortic aneurysm Fluoroquinolones have non-antimicrobial properties that might jeopardise the integrity of the extracellular matrix of the vascular wall In a  propensity score matched cohort study in [place], there was a 66% increased rate of aortic aneurysm or dissection associated with oral fluoroquinolone use, compared wit  Outpatient Meds Home medications from the H+P and/or nursing med reconciliation reviewed.  Inpatient med list reviewed  _____________________________________________________________________ Objective   Exam:  Current vital signs  Patient Vitals for  the past 8 hrs:  BP Temp Temp src Pulse Resp SpO2 Height Weight  05/16/23 0636 (!) 144/66 98.2 F (36.8 C) Oral 64 16 99 % -- --  05/16/23 0630 -- -- -- -- -- -- 5\' 10"  (1.778 m) 86.1 kg  05/16/23 0300 (!) 142/62 -- -- (!) 56 18 97 % -- --  05/16/23 0210 (!) 150/67 98.3 F (36.8 C) Oral (!) 57 18 98 % -- --    Intake/Output Summary (Last 24 hours) at 05/16/2023 0958 Last data filed at 05/16/2023 4166 Gross per 24 hour  Intake 0 ml  Output 0 ml  Net 0 ml    Physical Exam:  Alert, conversational, normal mental status, nontoxic.  He is however visibly uncomfortable from abdominal distention and cramps. Eyes: sclera anicteric, no redness ENT: oral mucosa moist without lesions, no cervical or supraclavicular lymphadenopathy, normal dentition CV: RRR without murmur, S1/S2, no JVD,, no peripheral edema Resp: clear to auscultation bilaterally, normal RR and effort noted GI: Distended and tympanitic, no active bowel sounds, diffuse tenderness without guarding Skin; warm and dry, no rash or jaundice noted Neuro: awake, alert and oriented x 3. Normal gross motor function and fluent speech. Current IV fluids normal saline at 100 cc an hour Labs:     Latest Ref Rng & Units 05/16/2023    1:38 AM 05/09/2023    5:13 AM 05/08/2023    2:16 PM  CBC  WBC 4.0 - 10.5 K/uL 4.6  2.9  2.8   Hemoglobin 13.0 - 17.0 g/dL 9.3  8.2  7.3   Hematocrit 39.0 - 52.0 % 29.1  25.6  23.7   Platelets 150 - 400 K/uL 129  101  102        Latest Ref Rng & Units 05/16/2023    1:38 AM 05/09/2023    5:13 AM 05/08/2023    5:26 AM  CMP  Glucose 70 - 99 mg/dL 063  016  010   BUN 8 - 23 mg/dL 17  20  22    Creatinine 0.61 - 1.24 mg/dL 9.32  3.55  7.32   Sodium 135 - 145 mmol/L 138  141  138   Potassium 3.5 - 5.1 mmol/L 4.9  4.7  5.2   Chloride 98 - 111 mmol/L 105  109  108   CO2 22 - 32 mmol/L 23  24  21    Calcium 8.9 - 10.3 mg/dL 8.7  8.1  8.1   Total Protein 6.5 - 8.1 g/dL 6.1  5.4  5.2   Total Bilirubin 0.3 -  1.2 mg/dL 0.6  0.9  0.7   Alkaline Phos 38 - 126 U/L 132  102  102   AST 15 - 41 U/L 29  32  39   ALT 0 - 44 U/L 28  33  38     No results for input(s): "INR" in the last 168 hours. _________________________________________________________ Radiologic studies: CLINICAL DATA:  Severe abdominal pain.  Bowel obstruction suspected   EXAM: CT ABDOMEN AND PELVIS WITHOUT CONTRAST   TECHNIQUE: Multidetector CT imaging of the abdomen and pelvis was performed following the standard protocol without IV contrast.   RADIATION DOSE REDUCTION: This exam was performed according to the departmental dose-optimization program which includes automated exposure control, adjustment of the mA and/or kV according to patient  size and/or use of iterative reconstruction technique.   COMPARISON:  03/27/2021   FINDINGS: Lower chest: Coronary artery and aortic atherosclerosis. Heart normal size. No effusions.   Hepatobiliary: Prior cholecystectomy. Scattered calcifications. No suspicious focal hepatic abnormality or biliary ductal dilatation.   Pancreas: No focal abnormality or ductal dilatation.   Spleen: No focal abnormality.  Normal size.   Adrenals/Urinary Tract: Multiple 1-2 mm stones in the right kidney. No ureteral stones or hydronephrosis. Exophytic lesion off the superior pole of the left kidney measures 2 cm and is stable dating back to prior study most compatible with cyst. 2.5 cm exophytic lesion off the midpole of the right kidney, also stable since prior study compatible with cyst. No follow-up imaging recommended. Adrenal glands and urinary bladder unremarkable.   Stomach/Bowel: Numerous dilated small bowel loops measuring up to 5 cm in diameter. Changes of prior right hemicolectomy. Distal small bowel decompressed. Findings compatible with high-grade small bowel obstruction. Exact transition not visualized, presumably adhesions related. Stomach unremarkable.   Vascular/Lymphatic:  Aortic atherosclerosis. No evidence of aneurysm or adenopathy.   Reproductive: No visible focal abnormality.   Other: No free fluid or free air.   Musculoskeletal: No acute bony abnormality.   IMPRESSION: Prior right hemicolectomy. Dilated small bowel loop into the pelvis with decompressed distal small bowel. Findings compatible with high-grade small bowel obstruction.   Right punctate nephrolithiasis.  No hydronephrosis.   Coronary artery disease, aortic atherosclerosis.     Electronically Signed   By: Charlett Nose M.D.   On: 05/16/2023 02:16    ______________________________________________________ Other studies:   _______________________________________________________ Assessment & Plan  Impression: High-grade small bowel obstruction, most likely due to adhesions from prior surgery. History of quiescent Crohn's disease History of colon cancer, no recurrence on subsequent surveillance colonoscopies.  This obstruction does not appear to be from active Crohn's disease.  He needs NG tube drainage now and then sufficient IV fluids to keep up with his output and baseline fluid requirements. H&P indicates that surgical consult was obtained.  He does not appear in immediate need of surgery, and hopefully he will respond to decompression and conservative management and not need operative intervention.  He is not acidemic or toxic appearing or have leukocytosis to suggest ischemic bowel.  We will follow, Dr. Meridee Score will begin managing the consult service tomorrow.   Thank you for the courtesy of this consult.  Please contact me with any questions or concerns.  Charlie Pitter III Office: [phone]

## 2023-05-16 NOTE — ED Notes (Signed)
Pt given urinal and made aware of need for urine specimen

## 2023-05-16 NOTE — ED Notes (Signed)
Patient has a critical lactic value of 2.01. Physician was notified
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
## 2023-05-16 NOTE — ED Notes (Signed)
No urine noted at this time in urinal

## 2023-05-16 NOTE — ED Triage Notes (Signed)
Pt c/o severe abdominal pain that started today around 5 pm. Last BM yesterday. Stst this pain feels like his bowel obstructions in the past. Recommended ED visit by pts gastroenterologist.

## 2023-05-16 NOTE — ED Provider Notes (Signed)
[place] EMERGENCY DEPARTMENT AT [facility] Provider Note   CSN: [id number] Arrival date & time: 05/16/23  0016     History  Chief Complaint  Patient presents with   Abdominal Pain    Derek [name] is a 82 y.o. male.  The history is provided by the patient.  Abdominal Pain He has history of hypertension, coronary artery disease paroxysmal atrial fibrillation anticoagulated on apixaban, duodenal ulcer, ascending aortic aneurysm, chronic kidney disease, renal tubular acidosis, multiple abdominal surgeries with multiple bowel obstructions and comes in because of generalized abdominal pain with nausea which started about 5 PM.  Symptoms started about 1 hour after eating some pork.  Symptoms are similar to what he has had with bowel obstructions in the past.  Pain does not radiate to the back, chest, shoulder.  He has not done anything to treat his symptoms.   Home Medications Prior to Admission medications   Medication Sig Start Date End Date Taking? Authorizing Provider  Acidophilus Lactobacillus CAPS Take 1 capsule by mouth daily. 06/08/16   Iva Boop, MD  amiodarone (PACERONE) 200 MG tablet Take 1 tablet (200 mg total) by mouth daily. 10/30/20   Jake Bathe, MD  calcium carbonate (TUMS - DOSED IN MG ELEMENTAL CALCIUM) 500 MG chewable tablet Chew 2 tablets by mouth 2 (two) times daily as needed for indigestion or heartburn.    [provider]  Cholecalciferol (VITAMIN D3) 50 MCG (2000 UT) TABS Take 2,000 Units by mouth daily.    [provider]  cyanocobalamin 1000 MCG tablet Take 1 tablet (1,000 mcg total) by mouth daily. 05/09/23   Sheikh, Omair Latif, DO  ELIQUIS 5 MG TABS tablet Take 1 tablet (5 mg total) by mouth 2 (two) times daily. Patient taking differently: Take 2.5 mg by mouth in the morning and at bedtime. 11/05/16   End, Cristal Deer, MD  famotidine (PEPCID) 40 MG tablet Take 40 mg by mouth daily before breakfast.    [provider]  Febuxostat (ULORIC) 80 MG TABS Take 80 mg by mouth in the morning.    [provider]  fluticasone (FLONASE) 50 MCG/ACT nasal spray Place 1 spray into both nostrils 2 (two) times daily as needed for allergies or rhinitis.    [provider]  HYDROcodone-acetaminophen (NORCO) 10-325 MG tablet Take 1 tablet by mouth 4 (four) times daily as needed (FOR PAIN). 12/05/19   [provider]  levothyroxine (SYNTHROID) 25 MCG tablet Take 25 mcg by mouth daily before breakfast.    [provider]  Magnesium Oxide 420 MG TABS Take 1,260 mg by mouth in the morning and at bedtime.    [provider]  Multiple Vitamin (MULTIVITAMIN WITH MINERALS) TABS tablet Take 1 tablet by mouth daily with breakfast.    [provider]  ondansetron (ZOFRAN) 4 MG tablet Take 4 mg by mouth every 8 (eight) hours as needed for nausea or vomiting.    Assunta Found, MD  pantoprazole (PROTONIX) 40 MG tablet Take 1 tablet (40 mg total) by mouth 2 (two) times daily for 30 days, THEN 1 tablet (40 mg total) daily. 05/09/23 07/08/23  Marguerita Merles Latif, DO  PARoxetine (PAXIL) 40 MG tablet Take 20 mg by mouth in the morning. 05/07/21   [provider]  rosuvastatin (CRESTOR) 10 MG tablet Take 1 tablet (10 mg total) by mouth daily. Patient taking differently: Take 5 mg by mouth daily. 10/17/20   Jake Bathe, MD  sodium bicarbonate  650 MG tablet Take 1,950 mg by mouth in the morning and at bedtime.    [provider]  sucralfate (CARAFATE) 1 GM/10ML suspension Take 1 g by mouth at bedtime as needed (to coat the stomach).    [provider]  traZODone (DESYREL) 150 MG tablet Take 150 mg by mouth at bedtime. 05/07/21   [provider]  TYLENOL 500 MG tablet Take 1,000 mg by mouth every 6 (six) hours as needed for mild pain or headache.    [provider]  Calcium Carbonate (CALCIUM 500 PO) Take 2 tablets by mouth 2 (two) times daily.    05/11/16  [provider]      Allergies    Lorazepam, Other, Humira [adalimumab], Colchicine, and Quinolones    Review of Systems   Review of Systems  Gastrointestinal:  Positive for abdominal pain.  All other systems reviewed and are negative.   Physical Exam Updated Vital Signs BP (!) 149/93 (BP Location: Left Arm)   Pulse 60   Temp (!) 97.4 F (36.3 C) (Oral)   Resp (!) 21   SpO2 100%  Physical Exam Vitals and nursing note reviewed.   82 year old male, appears mildly uncomfortable, but is in no acute distress. Vital signs are significant for mildly elevated blood pressure. Oxygen saturation is 100%, which is normal. Head is normocephalic and atraumatic. PERRLA, EOMI. Oropharynx is clear. Neck is nontender and supple. Back is nontender and there is no CVA tenderness. Lungs are clear without rales, wheezes, or rhonchi. Chest is nontender. Heart has regular rate and rhythm without murmur. Abdomen is soft, flat, with moderate tenderness diffusely.  There is no rebound or guarding.  Peristalsis is diminished but present. Extremities have no cyanosis or edema, full range of motion is present. Skin is warm and dry without rash. Neurologic: Mental status is normal, cranial nerves are intact, moves all extremities equally.  ED Results / Procedures / Treatments   Labs (all labs ordered are listed, but only abnormal results are displayed) Labs Reviewed  COMPREHENSIVE METABOLIC PANEL - Abnormal; Notable for the following components:      Result Value   Glucose, Bld 123 (*)    Creatinine, Ser 2.19 (*)    Calcium 8.7 (*)    Total Protein 6.1 (*)    Alkaline Phosphatase 132 (*)    GFR, Estimated 30 (*)    All other components within normal limits  CBC WITH DIFFERENTIAL/PLATELET - Abnormal; Notable for the following components:   RBC 3.12 (*)    Hemoglobin 9.3 (*)    HCT 29.1 (*)    RDW 15.9 (*)    Platelets 129 (*)    Lymphs Abs 0.5 (*)    All other components  within normal limits  I-STAT CG4 LACTIC ACID, ED - Abnormal; Notable for the following components:   Lactic Acid, Venous 2.0 (*)    All other components within normal limits  LIPASE, BLOOD  MAGNESIUM  URINALYSIS, ROUTINE W REFLEX MICROSCOPIC   Radiology CT ABDOMEN PELVIS WO CONTRAST  Result Date: 05/16/2023 CLINICAL DATA:  Severe abdominal pain.  Bowel obstruction suspected EXAM: CT ABDOMEN AND PELVIS WITHOUT CONTRAST TECHNIQUE: Multidetector CT imaging of the abdomen and pelvis was performed following the standard protocol without IV contrast. RADIATION DOSE REDUCTION: This exam was performed according to the departmental dose-optimization program which includes automated exposure control, adjustment of the mA and/or kV according to patient size and/or use of iterative reconstruction technique. COMPARISON:  03/27/2021 FINDINGS: Lower  chest: Coronary artery and aortic atherosclerosis. Heart normal size. No effusions. Hepatobiliary: Prior cholecystectomy. Scattered calcifications. No suspicious focal hepatic abnormality or biliary ductal dilatation. Pancreas: No focal abnormality or ductal dilatation. Spleen: No focal abnormality.  Normal size. Adrenals/Urinary Tract: Multiple 1-2 mm stones in the right kidney. No ureteral stones or hydronephrosis. Exophytic lesion off the superior pole of the left kidney measures 2 cm and is stable dating back to prior study most compatible with cyst. 2.5 cm exophytic lesion off the midpole of the right kidney, also stable since prior study compatible with cyst. No follow-up imaging recommended. Adrenal glands and urinary bladder unremarkable. Stomach/Bowel: Numerous dilated small bowel loops measuring up to 5 cm in diameter. Changes of prior right hemicolectomy. Distal small bowel decompressed. Findings compatible with high-grade small bowel obstruction. Exact transition not visualized, presumably adhesions related. Stomach unremarkable. Vascular/Lymphatic: Aortic  atherosclerosis. No evidence of aneurysm or adenopathy. Reproductive: No visible focal abnormality. Other: No free fluid or free air. Musculoskeletal: No acute bony abnormality. IMPRESSION: Prior right hemicolectomy. Dilated small bowel loop into the pelvis with decompressed distal small bowel. Findings compatible with high-grade small bowel obstruction. Right punctate nephrolithiasis.  No hydronephrosis. Coronary artery disease, aortic atherosclerosis. Electronically Signed   By: Charlett Nose M.D.   On: 05/16/2023 02:16    Procedures Procedures    Medications Ordered in ED Medications  ondansetron (ZOFRAN-ODT) disintegrating tablet 8 mg (8 mg Oral Given 05/16/23 0143)  morphine (PF) 4 MG/ML injection 4 mg (4 mg Intramuscular Given 05/16/23 0142)    ED Course/ Medical Decision Making/ A&P                                 Medical Decision Making Amount and/or Complexity of Data Reviewed Labs: ordered. Radiology: ordered.  Risk Prescription drug management. Decision regarding hospitalization.   Abdominal pain concerning for recurrent small bowel obstruction.  Differential diagnosis also includes diverticulitis vascular insufficiency, perforated viscus.  This differential diagnosis includes conditions with significant risk for morbidity and complications..  I have reviewed his past records, and he had been admitted 05/06/2023-05/09/2023 for GI bleeding.  He had hospitalizations in 2013, 2017, 2018 for small bowel obstructions.  I have ordered morphine for pain, ondansetron for nausea and I have ordered CT scan of abdomen and pelvis to evaluate for possible bowel obstruction.  I have also ordered screening labs of CBC, comprehensive metabolic panel, lipase.  I note he had hypomagnesemia on 05/09/2023, so I have also ordered a magnesium level.  CT scan shows high-grade small bowel obstruction.  I have independently viewed the images, and agree with the radiologist's interpretation.  I have  reviewed his laboratory test, my interpretation is stable renal insufficiency, mild elevation of random glucose, stable anemia.  Lactate is minimally elevated and not felt to be clinically significant.  It is not related to for sepsis but is related to his bowel obstruction.  I have discussed the case with Dr. Julian Reil of [facility], who agrees to admit the patient.  Final Clinical Impression(s) / ED Diagnoses Final diagnoses:  Small bowel obstruction (HCC)  Renal insufficiency  Normochromic normocytic anemia  Thrombocytopenia (HCC)    Rx / DC Orders ED Discharge Orders     None         Dione Booze, MD 05/16/23 [phone]

## 2023-05-16 NOTE — Assessment & Plan Note (Signed)
Cont BID PPI for the remainder of month (then back to once daily) as per GI recs from prior admit.

## 2023-05-16 NOTE — Assessment & Plan Note (Addendum)
In setting of: 1) numerous abdominal surgeries. 2) Prior Crohn's disease 3) prior colon cancer 4) recent duodenal ulcer with GIB - though pathology on duodenal ulcer from earlier this month looks benign.  Last prior SBO in 2018 it looks like.  NPO IVF Call gen surg in AM for formal consult PRN morphine PRN zofran

## 2023-05-16 NOTE — Assessment & Plan Note (Signed)
Mild, platelet count of 129 today is slightly improved from 101 at time of DC earlier this month.

## 2023-05-16 NOTE — Assessment & Plan Note (Addendum)
Creat unchanged compared to admit earlier this month, presumably baseline. Cont BID bicarb.

## 2023-05-16 NOTE — Assessment & Plan Note (Addendum)
Cont home amiodarone. Looks like eliquis was to be resumed on discharge after GIB admit earlier this month, so will continue eliquis.

## 2023-05-16 NOTE — Progress Notes (Addendum)
Brief progress note: -Patient was admitted earlier today. -As per H&P done earlier today: "Derek [name] is a 82 y.o. male with medical history significant of PAF on eliquis, CKD 3b, RTA, multiple abdominal surgeries previously for Crohn's disease, as well as colon cancer, prior SBO (though looks like most recent was 2018)   Pt recently admitted last week for GIB due to duodenal ulcer.  Pathology on duodenal ulcer appears to have come back benign.   Pt now in to ED today with c/o abd pain, nausea.  Symptoms onset 5 pm after eating.  Similar to prior SBOs in past.   W/u in ED confirms SBO".  05/16/2023: Patient seen alongside patient's nurse.  Surgery team's input is appreciated.  NG tube has been placed to low suction.  Patient feels a lot better.  Patient reported having broken wind.  Continue to manage expectantly.

## 2023-05-17 ENCOUNTER — Inpatient Hospital Stay (HOSPITAL_COMMUNITY): Payer: No Typology Code available for payment source

## 2023-05-17 ENCOUNTER — Encounter (HOSPITAL_COMMUNITY): Payer: Self-pay | Admitting: Internal Medicine

## 2023-05-17 ENCOUNTER — Observation Stay (HOSPITAL_COMMUNITY): Payer: No Typology Code available for payment source

## 2023-05-17 DIAGNOSIS — K56609 Unspecified intestinal obstruction, unspecified as to partial versus complete obstruction: Secondary | ICD-10-CM | POA: Diagnosis not present

## 2023-05-17 DIAGNOSIS — G2581 Restless legs syndrome: Secondary | ICD-10-CM | POA: Diagnosis present

## 2023-05-17 DIAGNOSIS — E039 Hypothyroidism, unspecified: Secondary | ICD-10-CM | POA: Diagnosis present

## 2023-05-17 DIAGNOSIS — N183 Chronic kidney disease, stage 3 unspecified: Secondary | ICD-10-CM | POA: Insufficient documentation

## 2023-05-17 DIAGNOSIS — Z8719 Personal history of other diseases of the digestive system: Secondary | ICD-10-CM | POA: Diagnosis not present

## 2023-05-17 DIAGNOSIS — N2581 Secondary hyperparathyroidism of renal origin: Secondary | ICD-10-CM | POA: Diagnosis present

## 2023-05-17 DIAGNOSIS — K565 Intestinal adhesions [bands], unspecified as to partial versus complete obstruction: Secondary | ICD-10-CM | POA: Diagnosis present

## 2023-05-17 DIAGNOSIS — J984 Other disorders of lung: Secondary | ICD-10-CM | POA: Insufficient documentation

## 2023-05-17 DIAGNOSIS — R0683 Snoring: Secondary | ICD-10-CM | POA: Insufficient documentation

## 2023-05-17 DIAGNOSIS — M2022 Hallux rigidus, left foot: Secondary | ICD-10-CM | POA: Insufficient documentation

## 2023-05-17 DIAGNOSIS — I129 Hypertensive chronic kidney disease with stage 1 through stage 4 chronic kidney disease, or unspecified chronic kidney disease: Secondary | ICD-10-CM | POA: Diagnosis present

## 2023-05-17 DIAGNOSIS — Z85038 Personal history of other malignant neoplasm of large intestine: Secondary | ICD-10-CM | POA: Diagnosis not present

## 2023-05-17 DIAGNOSIS — N1832 Chronic kidney disease, stage 3b: Secondary | ICD-10-CM | POA: Diagnosis not present

## 2023-05-17 DIAGNOSIS — N184 Chronic kidney disease, stage 4 (severe): Secondary | ICD-10-CM | POA: Diagnosis present

## 2023-05-17 DIAGNOSIS — Z1152 Encounter for screening for COVID-19: Secondary | ICD-10-CM | POA: Diagnosis not present

## 2023-05-17 DIAGNOSIS — H524 Presbyopia: Secondary | ICD-10-CM | POA: Insufficient documentation

## 2023-05-17 DIAGNOSIS — Z79890 Hormone replacement therapy (postmenopausal): Secondary | ICD-10-CM | POA: Diagnosis not present

## 2023-05-17 DIAGNOSIS — D696 Thrombocytopenia, unspecified: Secondary | ICD-10-CM | POA: Diagnosis present

## 2023-05-17 DIAGNOSIS — K269 Duodenal ulcer, unspecified as acute or chronic, without hemorrhage or perforation: Secondary | ICD-10-CM | POA: Diagnosis present

## 2023-05-17 DIAGNOSIS — J8410 Pulmonary fibrosis, unspecified: Secondary | ICD-10-CM | POA: Diagnosis present

## 2023-05-17 DIAGNOSIS — K5080 Crohn's disease of both small and large intestine without complications: Secondary | ICD-10-CM | POA: Diagnosis present

## 2023-05-17 DIAGNOSIS — Z7901 Long term (current) use of anticoagulants: Secondary | ICD-10-CM | POA: Diagnosis not present

## 2023-05-17 DIAGNOSIS — K219 Gastro-esophageal reflux disease without esophagitis: Secondary | ICD-10-CM | POA: Diagnosis present

## 2023-05-17 DIAGNOSIS — N400 Enlarged prostate without lower urinary tract symptoms: Secondary | ICD-10-CM | POA: Insufficient documentation

## 2023-05-17 DIAGNOSIS — K319 Disease of stomach and duodenum, unspecified: Secondary | ICD-10-CM | POA: Insufficient documentation

## 2023-05-17 DIAGNOSIS — I2510 Atherosclerotic heart disease of native coronary artery without angina pectoris: Secondary | ICD-10-CM | POA: Diagnosis present

## 2023-05-17 DIAGNOSIS — I7121 Aneurysm of the ascending aorta, without rupture: Secondary | ICD-10-CM | POA: Diagnosis present

## 2023-05-17 DIAGNOSIS — K5669 Other partial intestinal obstruction: Secondary | ICD-10-CM | POA: Diagnosis not present

## 2023-05-17 DIAGNOSIS — G4730 Sleep apnea, unspecified: Secondary | ICD-10-CM | POA: Insufficient documentation

## 2023-05-17 DIAGNOSIS — B182 Chronic viral hepatitis C: Secondary | ICD-10-CM | POA: Diagnosis present

## 2023-05-17 DIAGNOSIS — Z8616 Personal history of COVID-19: Secondary | ICD-10-CM | POA: Diagnosis not present

## 2023-05-17 DIAGNOSIS — F419 Anxiety disorder, unspecified: Secondary | ICD-10-CM | POA: Diagnosis present

## 2023-05-17 DIAGNOSIS — L719 Rosacea, unspecified: Secondary | ICD-10-CM | POA: Insufficient documentation

## 2023-05-17 DIAGNOSIS — Z8249 Family history of ischemic heart disease and other diseases of the circulatory system: Secondary | ICD-10-CM | POA: Diagnosis not present

## 2023-05-17 DIAGNOSIS — N189 Chronic kidney disease, unspecified: Secondary | ICD-10-CM | POA: Insufficient documentation

## 2023-05-17 DIAGNOSIS — D649 Anemia, unspecified: Secondary | ICD-10-CM | POA: Diagnosis present

## 2023-05-17 DIAGNOSIS — J84178 Other interstitial pulmonary diseases with fibrosis in diseases classified elsewhere: Secondary | ICD-10-CM | POA: Insufficient documentation

## 2023-05-17 DIAGNOSIS — Z7729 Contact with and (suspected ) exposure to other hazardous substances: Secondary | ICD-10-CM | POA: Insufficient documentation

## 2023-05-17 DIAGNOSIS — F4310 Post-traumatic stress disorder, unspecified: Secondary | ICD-10-CM | POA: Insufficient documentation

## 2023-05-17 DIAGNOSIS — L710 Perioral dermatitis: Secondary | ICD-10-CM | POA: Insufficient documentation

## 2023-05-17 DIAGNOSIS — N2589 Other disorders resulting from impaired renal tubular function: Secondary | ICD-10-CM | POA: Diagnosis present

## 2023-05-17 DIAGNOSIS — I480 Paroxysmal atrial fibrillation: Secondary | ICD-10-CM | POA: Diagnosis not present

## 2023-05-17 DIAGNOSIS — Z85828 Personal history of other malignant neoplasm of skin: Secondary | ICD-10-CM | POA: Diagnosis not present

## 2023-05-17 LAB — BASIC METABOLIC PANEL
Anion gap: 6 (ref 5–15)
BUN: 18 mg/dL (ref 8–23)
CO2: 26 mmol/L (ref 22–32)
Calcium: 8 mg/dL — ABNORMAL LOW (ref 8.9–10.3)
Chloride: 104 mmol/L (ref 98–111)
Creatinine, Ser: 1.84 mg/dL — ABNORMAL HIGH (ref 0.61–1.24)
GFR, Estimated: 37 mL/min — ABNORMAL LOW (ref >60)
Glucose, Bld: 109 mg/dL — ABNORMAL HIGH (ref 70–99)
Potassium: 4.8 mmol/L (ref 3.5–5.1)
Sodium: 136 mmol/L (ref 135–145)

## 2023-05-17 LAB — MAGNESIUM: Magnesium: 1.7 mg/dL (ref 1.7–2.4)

## 2023-05-17 LAB — CBC
HCT: 29.9 % — ABNORMAL LOW (ref 39.0–52.0)
Hemoglobin: 9.3 g/dL — ABNORMAL LOW (ref 13.0–17.0)
MCH: 29.4 pg (ref 26.0–34.0)
MCHC: 31.1 g/dL (ref 30.0–36.0)
MCV: 94.6 fL (ref 80.0–100.0)
Platelets: 122 10*3/uL — ABNORMAL LOW (ref 150–400)
RBC: 3.16 MIL/uL — ABNORMAL LOW (ref 4.22–5.81)
RDW: 15.9 % — ABNORMAL HIGH (ref 11.5–15.5)
WBC: 4.2 10*3/uL (ref 4.0–10.5)
nRBC: 0 % (ref 0.0–0.2)

## 2023-05-17 LAB — SEDIMENTATION RATE: Sed Rate: 10 mm/h (ref 0–16)

## 2023-05-17 LAB — C-REACTIVE PROTEIN: CRP: 0.8 mg/dL (ref <1.0)

## 2023-05-17 MED ORDER — MENTHOL 3 MG MT LOZG: 1.0000 | LOZENGE | OROMUCOSAL | Status: DC | PRN

## 2023-05-17 MED ORDER — METHOCARBAMOL 1000 MG/10ML IJ SOLN
1000.0000 mg | Freq: Four times a day (QID) | INTRAVENOUS | Status: DC | PRN
  Administered 2023-05-17: 1000 mg via INTRAVENOUS
  Filled 2023-05-17: qty 1000

## 2023-05-17 MED ORDER — SALINE SPRAY 0.65 % NA SOLN: 1.0000 | Freq: Four times a day (QID) | NASAL | Status: DC | PRN

## 2023-05-17 MED ORDER — ALUM & MAG HYDROXIDE-SIMETH 200-200-20 MG/5ML PO SUSP: 30.0000 mL | Freq: Four times a day (QID) | ORAL | Status: DC | PRN

## 2023-05-17 MED ORDER — MAGIC MOUTHWASH: 15.0000 mL | Freq: Four times a day (QID) | ORAL | Status: DC | PRN

## 2023-05-17 MED ORDER — ENOXAPARIN SODIUM 40 MG/0.4ML IJ SOSY
40.0000 mg | PREFILLED_SYRINGE | Freq: Every day | INTRAMUSCULAR | Status: DC
  Administered 2023-05-18 – 2023-05-19 (×2): 40 mg via SUBCUTANEOUS
  Filled 2023-05-17 (×2): qty 0.4

## 2023-05-17 MED ORDER — LACTATED RINGERS IV SOLN: INTRAVENOUS | Status: AC

## 2023-05-17 MED ORDER — SIMETHICONE 40 MG/0.6ML PO SUSP: 80.0000 mg | Freq: Four times a day (QID) | ORAL | Status: DC | PRN

## 2023-05-17 MED ORDER — ONDANSETRON HCL 4 MG/2ML IJ SOLN: 4.0000 mg | Freq: Four times a day (QID) | INTRAMUSCULAR | Status: DC | PRN

## 2023-05-17 MED ORDER — DIPHENHYDRAMINE HCL 50 MG/ML IJ SOLN
12.5000 mg | Freq: Four times a day (QID) | INTRAMUSCULAR | Status: DC | PRN
  Administered 2023-05-18: 12.5 mg via INTRAVENOUS
  Filled 2023-05-17: qty 1

## 2023-05-17 MED ORDER — PHENOL 1.4 % MT LIQD
2.0000 | OROMUCOSAL | Status: DC | PRN
  Administered 2023-05-17: 2 via OROMUCOSAL
  Filled 2023-05-17: qty 177

## 2023-05-17 MED ORDER — SODIUM CHLORIDE 0.9 % IV SOLN: 8.0000 mg | Freq: Four times a day (QID) | INTRAVENOUS | Status: DC | PRN

## 2023-05-17 MED ORDER — PANTOPRAZOLE SODIUM 40 MG IV SOLR
40.0000 mg | Freq: Two times a day (BID) | INTRAVENOUS | Status: DC
  Administered 2023-05-17 – 2023-05-18 (×3): 40 mg via INTRAVENOUS
  Filled 2023-05-17 (×3): qty 10

## 2023-05-17 MED ORDER — LACTATED RINGERS IV BOLUS: 1000.0000 mL | Freq: Three times a day (TID) | INTRAVENOUS | Status: DC | PRN

## 2023-05-17 MED ORDER — DIATRIZOATE MEGLUMINE & SODIUM 66-10 % PO SOLN
90.0000 mL | Freq: Once | ORAL | Status: AC
  Administered 2023-05-17: 90 mL via NASOGASTRIC
  Filled 2023-05-17: qty 90

## 2023-05-17 MED ORDER — ONDANSETRON HCL 4 MG/2ML IJ SOLN
4.0000 mg | Freq: Four times a day (QID) | INTRAMUSCULAR | Status: DC | PRN
Start: 2023-05-17 — End: 2023-05-17

## 2023-05-17 MED ORDER — NAPHAZOLINE-GLYCERIN 0.012-0.25 % OP SOLN
1.0000 [drp] | Freq: Four times a day (QID) | OPHTHALMIC | Status: DC | PRN
  Administered 2023-05-18: 2 [drp] via OPHTHALMIC
  Filled 2023-05-17: qty 15

## 2023-05-17 MED ORDER — ONDANSETRON HCL 4 MG/2ML IJ SOLN
4.0000 mg | Freq: Four times a day (QID) | INTRAMUSCULAR | Status: DC | PRN
  Administered 2023-05-17 – 2023-05-18 (×2): 4 mg via INTRAVENOUS
  Filled 2023-05-17 (×2): qty 2

## 2023-05-17 MED ORDER — MAGNESIUM SULFATE 2 GM/50ML IV SOLN
2.0000 g | Freq: Once | INTRAVENOUS | Status: AC
  Administered 2023-05-17: 2 g via INTRAVENOUS
  Filled 2023-05-17: qty 50

## 2023-05-17 MED ORDER — PROCHLORPERAZINE EDISYLATE 10 MG/2ML IJ SOLN: 5.0000 mg | INTRAMUSCULAR | Status: DC | PRN

## 2023-05-17 MED ORDER — BISACODYL 10 MG RE SUPP: 10.0000 mg | Freq: Two times a day (BID) | RECTAL | Status: DC | PRN

## 2023-05-17 MED ORDER — HYDROMORPHONE HCL 1 MG/ML IJ SOLN
0.5000 mg | INTRAMUSCULAR | Status: DC | PRN
  Administered 2023-05-17 – 2023-05-18 (×3): 0.5 mg via INTRAVENOUS
  Filled 2023-05-17 (×3): qty 0.5

## 2023-05-17 NOTE — Progress Notes (Signed)
Progress Note    Derek [name]   ZOX:[id number]  DOB: 03/24/1941  DOA: 05/16/2023     0 PCP: Derek Found, MD  Initial CC: abd pain, N/V  Hospital Course: [name] is an 82 yo male with PMH Crohn's disease, not on current treatment, colon cancer s/p resection, prior SBO, PAF on Eliquis, CKD3b who presented with abdominal pain and nausea/vomiting. CT abdomen/pelvis was performed which showed prior right hemicolectomy and dilated small bowel loop into the pelvis with decompressed distal small bowel concerning for small bowel obstruction. He underwent NG tube placement and was admitted for surgery evaluation.  Interval History:  Feeling a little better since admission.  No further vomiting since NG tube placed.  Assessment and Plan: * SBO (small bowel obstruction) (HCC) - At risk for adhesions and further obstructions due to prior history of abdominal surgeries, history of Crohn's -Continue NG tube - GI and general surgery following, appreciate assistance - Check ESR and CRP - Continue fluids  Stage 3b chronic kidney disease (CKD) (HCC) - patient has history of CKD3b. Baseline creat ~ 1.8 - 2, eGFR~ 35 - at baseline - continue IVF   Duodenal ulcer Cont BID PPI for the remainder of month (then back to once daily) as per GI recs from prior admit.  Paroxysmal atrial fibrillation (HCC) Cont home amiodarone. Looks like eliquis was to be resumed on discharge after GIB admit earlier this month - hold for now in case of need for intervention  Thrombocytopenia (HCC) - chronically low; baseline appears around 100-120k - remains at baseline  Anxiety - Hold Paxil for now, will resume as able   Old records reviewed in assessment of this patient  Antimicrobials:   DVT prophylaxis:   Lovenox  Code Status:   Code Status: Full Code  Mobility Assessment (Last 72 Hours)     Mobility Assessment     Row Name 05/17/23 0749 05/16/23 2040 05/16/23 0958 05/16/23 0630      Does patient have an order for bedrest or is patient medically unstable No - Continue assessment No - Continue assessment No - Continue assessment No - Continue assessment    What is the highest level of mobility based on the progressive mobility assessment? Level 6 (Walks independently in room and hall) - Balance while walking in room without assist - Complete Level 6 (Walks independently in room and hall) - Balance while walking in room without assist - Complete Level 6 (Walks independently in room and hall) - Balance while walking in room without assist - Complete Level 6 (Walks independently in room and hall) - Balance while walking in room without assist - Complete             Barriers to discharge: none Disposition Plan:  Home Status is: Inpt  Objective: Blood pressure 138/78, pulse 63, temperature 98 F (36.7 C), temperature source Oral, resp. rate 17, height 5\' 10"  (1.778 m), weight 86.1 kg, SpO2 97%.  Examination:  Physical Exam Constitutional:      General: He is not in acute distress.    Appearance: Normal appearance.  HENT:     Head: Normocephalic and atraumatic.     Mouth/Throat:     Mouth: Mucous membranes are moist.  Eyes:     Extraocular Movements: Extraocular movements intact.  Cardiovascular:     Rate and Rhythm: Normal rate and regular rhythm.  Pulmonary:     Effort: Pulmonary effort is normal. No respiratory distress.     Breath sounds: Normal  breath sounds. No wheezing.  Abdominal:     General: Bowel sounds are decreased. There is no distension.     Palpations: Abdomen is soft.     Tenderness: There is no abdominal tenderness.  Musculoskeletal:        General: Normal range of motion.     Cervical back: Normal range of motion and neck supple.  Skin:    General: Skin is warm and dry.  Neurological:     General: No focal deficit present.     Mental Status: He is alert.  Psychiatric:        Mood and Affect: Mood normal.        Behavior: Behavior  normal.      Consultants:  GI General surgery  Procedures:    Data Reviewed: Results for orders placed or performed during the hospital encounter of 05/16/23 (from the past 24 hour(s))  CBC     Status: Abnormal   Collection Time: 05/17/23  4:19 AM  Result Value Ref Range   WBC 4.2 4.0 - 10.5 K/uL   RBC 3.16 (L) 4.22 - 5.81 MIL/uL   Hemoglobin 9.3 (L) 13.0 - 17.0 g/dL   HCT 16.1 (L) 09.6 - 04.5 %   MCV 94.6 80.0 - 100.0 fL   MCH 29.4 26.0 - 34.0 pg   MCHC 31.1 30.0 - 36.0 g/dL   RDW 40.9 (H) 81.1 - 91.4 %   Platelets 122 (L) 150 - 400 K/uL   nRBC 0.0 0.0 - 0.2 %  Basic metabolic panel     Status: Abnormal   Collection Time: 05/17/23  4:19 AM  Result Value Ref Range   Sodium 136 135 - 145 mmol/L   Potassium 4.8 3.5 - 5.1 mmol/L   Chloride 104 98 - 111 mmol/L   CO2 26 22 - 32 mmol/L   Glucose, Bld 109 (H) 70 - 99 mg/dL   BUN 18 8 - 23 mg/dL   Creatinine, Ser 7.82 (H) 0.61 - 1.24 mg/dL   Calcium 8.0 (L) 8.9 - 10.3 mg/dL   GFR, Estimated 37 (L) >60 mL/min   Anion gap 6 5 - 15  Magnesium     Status: None   Collection Time: 05/17/23  4:19 AM  Result Value Ref Range   Magnesium 1.7 1.7 - 2.4 mg/dL  Sedimentation rate     Status: None   Collection Time: 05/17/23  9:12 AM  Result Value Ref Range   Sed Rate 10 0 - 16 mm/hr  C-reactive protein     Status: None   Collection Time: 05/17/23  9:12 AM  Result Value Ref Range   CRP 0.8 <1.0 mg/dL    I have reviewed pertinent nursing notes, vitals, labs, and images as necessary. I have ordered labwork to follow up on as indicated.  I have reviewed the last notes from staff over past 24 hours. I have discussed patient's care plan and test results with nursing staff, CM/SW, and other staff as appropriate.  Time spent: Greater than 50% of the 55 minute visit was spent in counseling/coordination of care for the patient as laid out in the A&P.   LOS: 0 days   Lewie Chamber, MD [facility] 05/17/2023, 1:14 PM

## 2023-05-17 NOTE — Assessment & Plan Note (Signed)
-   Resume Paxil

## 2023-05-17 NOTE — Hospital Course (Addendum)
Mr. Capozzi is an 82 yo male with PMH Crohn's disease, not on current treatment, colon cancer s/p resection, prior SBO, PAF on Eliquis, CKD3b who presented with abdominal pain and nausea/vomiting. CT abdomen/pelvis was performed which showed prior right hemicolectomy and dilated small bowel loop into the pelvis with decompressed distal small bowel concerning for small bowel obstruction. He underwent NG tube placement and was admitted for surgery evaluation.

## 2023-05-17 NOTE — Progress Notes (Signed)
Mobility Specialist - Progress Note   05/17/23 1000  Mobility  Activity Ambulated with assistance in hallway  Level of Assistance Modified independent, requires aide device or extra time  Assistive Device Other (Comment) (IV Pole)  Distance Ambulated (ft) 500 ft  Activity Response Tolerated well  Mobility Referral Yes  $Mobility charge 1 Mobility  Mobility Specialist Start Time (ACUTE ONLY) [id number]  Mobility Specialist Stop Time (ACUTE ONLY) 0959  Mobility Specialist Time Calculation (min) (ACUTE ONLY) 3 min   Pt received in recliner and agreeable to mobility. No complaints during session. Pt to recliner after session with all needs met.     [facility]

## 2023-05-17 NOTE — Consult Note (Addendum)
Derek [name]  1941/06/01 [id number]  CARE TEAM:  PCP: Assunta Found, MD  Outpatient Care Team: Patient Care Team: Assunta Found, MD as PCP - General (Family Medicine) Jake Bathe, MD as PCP - Cardiology (Cardiology) Deterding, Fayrene Fearing, MD as Consulting Physician (Nephrology) Francena Hanly, MD as Consulting Physician (Orthopedic Surgery) Laurey Morale, MD as Consulting Physician (Cardiology) Iva Boop, MD as Consulting Physician (Gastroenterology) Ovidio Kin, MD as Consulting Physician (General Surgery) Ginnie Smart, MD as Consulting Physician (Infectious Diseases) Stechschulte, Hyman Hopes, MD as Consulting Physician (Surgery)  Inpatient Treatment Team: Treatment Team:  Lewie Chamber, MD Sherrilyn Rist, MD Lilyan Gilford, MD Ivan Anchors, RN Misty Stanley, NT Hessie Knows, [facility], Md, MD Anselm Pancoast, LCSW Nathanial Rancher, RN   This patient is a 82 y.o.male who presents today for surgical evaluation at the request of Dr Myrtie Neither.   Chief complaint / Reason for evaluation: SBO.  H/o Crohns  82 year old male familiar to our group.  History of Crohn's disease by [facility].  Last colonoscopy 2022 showing no frank colitis or ileitis.  I think he had issues on Humira so has been off and his suppression for some time for some time and apparently is quiescent.  Dr. Leone Payor made Gastroenterology's.  First resection ilio sick ectomy 1978.  Reoperation for recurrence and then [facility] treatment.  Developed transverse colon cancer requiring resection January 2018.  Developed a leak requiring Luz Brazen with end ileostomy.  Ileostomy reversal December 2018.  No other surgeries.  He has had issues with bowel obstruction is come in every few years.  Having nausea vomiting abdominal pain.  Came to the ER concern for bowel obstruction NG tube placed.  Gastroenterology consulted.  Dr. Myrtie Neither does not feel the patient has any active Crohn's  disease.  Claims he has had a bowel movement and wants the NG tube out.   Assessment  Derek [name]  82 y.o. male       Problem List:  Principal Problem:   SBO (small bowel obstruction) (HCC) Active Problems:   CORONARY ATHEROSCLEROSIS NATIVE CORONARY ARTERY   GERD   Anxiety   Stage 3b chronic kidney disease (CKD) (HCC)   Thrombocytopenia (HCC)   Paroxysmal atrial fibrillation (HCC)   Adenomatous polyp of transverse colon s/p partial colectomy 08/14/2016   Chronic anticoagulation   Personal history of colon cancer   Hypothyroidism   Duodenal ulcer   Hepatitis C, chronic (HCC)   Insomnia   Small bowel obstruction most likely due to adhesions.  Plan:  NG tube.  Small bowel protocol.  IV fluids.  Nausea and pain control.  There is no evidence of any carcinomatosis or recurrent colon cancer.  Looks like it was very early stage anyway.  I see no obvious inflammation or bowel wall thickening concerning for recurrence of Crohn's disease but that would be a concern.  Gastroenterology does not feel strongly anything needs to happen right now.  Patient due to follow-up with Dr. Leone Payor couple weeks to see if something else needs to be treated.  Patient markedly worsens or does not improve by 72 hours, may require operative exploration.  We will see.  -VTE prophylaxis- SCDs, etc  -mobilize as tolerated to help recovery  I reviewed nursing notes, Consultant GI notes, hospitalist notes, last 24 h vitals and pain scores, last 48 h intake and output, last 24 h labs and trends, and last 24 h imaging results. I  have reviewed this patient's available data, including medical history, events of note, test results, etc as part of my evaluation.  A significant portion of that time was spent in counseling.  Care during the described time interval was provided by me.  This care required moderate level of medical decision making.  05/17/2023  Ardeth Sportsman, MD, FACS,  MASCRS Esophageal, Gastrointestinal & Colorectal Surgery Robotic and Minimally Invasive Surgery  [facility] [address] [place], [place] [address] [phone] Fax [phone] Main  CONTACT INFORMATION: Weekday (9AM-5PM): Call [facility] main office at [phone] Weeknight (5PM-9AM) or Weekend/Holiday: Check EPIC "Web Links" tab & use "AMION" (password " [email or web address]") for General Surgery [facility] coverage  Please, DO NOT use SecureChat  (it is not reliable communication to reach operating surgeons & will lead to a delay in care).   Epic staff messaging available for outptient concerns needing 1-2 business day response.      05/17/2023      Past Medical History:  Diagnosis Date   Anemia    Anxiety    Aortic insufficiency    a. mild-mod by echo 09/2015.   Arthritis    "knees; left shoulder" (09/21/2013) oa   Ascending aortic aneurysm (HCC)    stable less than 5 cm per 11-21-2020 chest ct epic   Atrial fibrillation (HCC)    B12 deficiency    takes Vit 12 shot every 14days    Bowel perforation (HCC) 08/27/2016   CKD (chronic kidney disease) stage 3 B, GFR 30-59 ml/min (HCC) 08/22/2011   dr patel Theron Arista 08-26-2021   Colon cancer [facility]) 2018   surgery   Colonic ischemia (HCC) 08/27/2016   Coronary artery disease    COVID-19 03/29/2021   hospitalized from 03-29-2021 to 03-31-2021   Crohn's disease (HCC)    Crohn's ileocolitis (HCC) 06/26/2009   1980 - inflammatory mass - terminal ileal (R hemicolectomy) resection  Reaction to 6 MP 2006  ? Pancreatitis from Humira  Recurrent SBO's  Has had ileocolonic stricture dilated to 18 mm last 2013        Enteric hyperoxaluria 02/21/2016   GERD (gastroesophageal reflux disease)    GI bleed    Gout    takes Uloric  daily   Gout 05/09/2008   Qualifier: Diagnosis of   By: Nelson-Smith CMA (AAMA), Dottie         Gout flare 09/02/2021   right heel decadron 10 mg im given   Hearing loss     Heart murmur    Hematuria    few weeks ago per pt wife pam on 09-03-2021   Hepatitis C 1978   negtive RNA load - spontaneously cleared   Hiatal hernia    High output ileostomy (HCC) 06/02/2017   History of blood transfusion 1978; 1990's; ?   "w/bowel resection; S/P allupurinol; ?" (09/21/2013)   History of colon polyps    History of dvt 2006   late 1980's earlt 1990's from jumping from plane left leg   History of kidney stones    1980's   History of MRSA infection 2010   History of small bowel obstruction    History of staph infection 1978   Hyperoxaluria    Intestinal   Hypertension    Hypothyroidism    Insomnia    takes Trazodone nightly   Internal hemorrhoids    LV dysfunction    a. h/o EF 45-50% in 2015, normalized on subsequent echoes.  Nephrolithiasis    Nocardia infection 09/2016   resolved   Pancreatitis 2010   elevated lipase and amylase, stranding in tail of pancreas, ? from Humira   Pancytopenia    Hx of   Peripheral neuropathy    takes Gabapentin daily in both legs   Pneumonia    several times last times 2012   POST TRAUMATIC STRESS SYNDROME 05/09/2008   Qualifier: Diagnosis of   By: Candice Camp CMA (AAMA), Dottie         PTSD (post-traumatic stress disorder)    takes paxil for   Pulmonary nodule    a. 6mm by CT 05/2015, recommended f/u 6-12 months.   Renal tubular acidosis    RLS (restless legs syndrome)    Rosacea conjunctivitis(372.31)    Secondary hyperparathyroidism (HCC) 02/21/2016   Shingles 2022   head close to eye   Short bowel syndrome    Sinus bradycardia 2018   none since   Skin cancer    "cut/burned off left ear and face" (09/21/2013)   Sleep apnea    Small bowel obstruction (HCC) 1978   Status post reversal of ileostomy 07/23/2017   Thrombocytopenia (HCC)    problems since 1978   Wears glasses reading     Past Surgical History:  Procedure Laterality Date   ANKLE SURGERY Right    APPENDECTOMY  1978   BIOPSY  05/08/2023    Procedure: BIOPSY;  Surgeon: Hilarie Fredrickson, MD;  Location: [facility];  Service: Gastroenterology;;   BOWEL RESECTION  1978 X 2   CARDIOVERSION N/A 05/29/2016   Procedure: CARDIOVERSION;  Surgeon: Thurmon Fair, MD;  Location: [facility];  Service: Cardiovascular;  Laterality: N/A;   CHOLECYSTECTOMY     yrs ago   COLON RESECTION N/A 08/14/2016   Procedure: LAPAROSCOPIC RESECTION TRANSVERSE COLON;  Surgeon: Ovidio Kin, MD;  Location: [facility];  Service: General;  Laterality: N/A;   COLON SURGERY  2018   ileostomy   COLONOSCOPY     ESOPHAGOGASTRODUODENOSCOPY     ESOPHAGOGASTRODUODENOSCOPY (EGD) WITH PROPOFOL N/A 05/08/2023   Procedure: ESOPHAGOGASTRODUODENOSCOPY (EGD) WITH PROPOFOL;  Surgeon: Hilarie Fredrickson, MD;  Location: [facility];  Service: Gastroenterology;  Laterality: N/A;   EXTRACORPOREAL SHOCK WAVE LITHOTRIPSY     yrs ago   EYE SURGERY     cataract surgery bilateral   FOOT SURGERY Right    "took gout out"   HEMICOLECTOMY Right    ILEOCECETOMY  1978   /notes 05/10/2000  (09/21/2013)   ILEOSTOMY     ILEOSTOMY CLOSURE N/A 07/23/2017   Procedure: ILEOSTOMY REVERSAL ;  Surgeon: Ovidio Kin, MD;  Location: [facility];  Service: General;  Laterality: N/A;   INGUINAL HERNIA REPAIR Right    IR FLUORO GUIDE CV LINE RIGHT  05/07/2017   IR REMOVAL TUN CV CATH W/O FL  10/08/2017   IR US GUIDE VASC ACCESS RIGHT  05/07/2017   KNEE ARTHROSCOPY Left    LAPAROTOMY N/A 08/27/2016   Procedure: EXPLORATORYLAPAROTOMY, LYSIS OF ADHESIONS, ILEOSTOMY, RIGHT COLECTOMY;  Surgeon: Ovidio Kin, MD;  Location: [facility];  Service: General;  Laterality: N/A;   LIGAMENT REPAIR Left    POLYPECTOMY     right big toe surgery  06/2021   [place]   TEE WITHOUT CARDIOVERSION N/A 05/29/2016   Procedure: TRANSESOPHAGEAL ECHOCARDIOGRAM (TEE);  Surgeon: Thurmon Fair, MD;  Location: [facility];  Service: Cardiovascular;  Laterality: N/A;   TOTAL SHOULDER ARTHROPLASTY Left 09/21/2013   Procedure: LEFT TOTAL  SHOULDER ARTHROPLASTY;  Surgeon: Caryn Bee  Gregor Hams, MD;  Location: [facility];  Service: Orthopedics;  Laterality: Left;   WOUND DEBRIDEMENT N/A 09/05/2021   Procedure: CHRONIC ABDOMINAL WOUND EXPLORATION, REMOVAL OF PROLENE STITCH ABCESS;  Surgeon: Stechschulte, Hyman Hopes, MD;  Location: [facility];  Service: General;  Laterality: N/A;    Social History   Socioeconomic History   Marital status: Married    Spouse name: [name]   Number of children: 2   Years of education: Not on file   Highest education level: Not on file  Occupational History   Occupation: Retired  Tobacco Use   Smoking status: Former    Current packs/day: 0.00    Average packs/day: 2.0 packs/day for 20.0 years (40.0 ttl pk-yrs)    Types: Cigarettes    Start date: 08/04/1955    Quit date: 08/04/1975    Years since quitting: 47.8   Smokeless tobacco: Former    Types: Chew    Quit date: 08/03/1978   Tobacco comments:    09/21/2013 "quit smoking in the late 1970's; stopped chewing couple years after I quit smoking"  Vaping Use   Vaping status: Never Used  Substance and Sexual Activity   Alcohol use: No   Drug use: No   Sexual activity: Not Currently  Other Topics Concern   Not on file  Social History Narrative   Married 2 children and 6 grandchildren all local   [employer] 2+ tours [phone] - some care from [place] also   Daily caffeine   Does not exercise regularly   Social Determinants of Health   Financial Resource Strain: Low Risk  (04/08/2022)   Overall Financial Resource Strain (CARDIA)    Difficulty of Paying Living Expenses: Not hard at all  Food Insecurity: No Food Insecurity (05/16/2023)   Hunger Vital Sign    Worried About Running Out of Food in the Last Year: Never true    Ran Out of Food in the Last Year: Never true  Transportation Needs: No Transportation Needs (05/16/2023)   PRAPARE - Administrator, Civil Service (Medical): No    Lack of Transportation (Non-Medical): No   Physical Activity: Not on file  Stress: Not on file  Social Connections: Not on file  Intimate Partner Violence: Not At Risk (05/16/2023)   Humiliation, Afraid, Rape, and Kick questionnaire    Fear of Current or Ex-Partner: No    Emotionally Abused: No    Physically Abused: No    Sexually Abused: No    Family History  Problem Relation Age of Onset   Aneurysm Mother    Emphysema Mother        smoked   Kidney disease Father    Hypertension Father    Aneurysm Sister    Esophageal cancer Neg Hx    Stomach cancer Neg Hx    Rectal cancer Neg Hx    Colon cancer Neg Hx     Current Facility-Administered Medications  Medication Dose Route Frequency Provider Last Rate Last Admin   acetaminophen (TYLENOL) tablet 650 mg  650 mg Oral Q6H PRN Hillary Bow, DO       Or   acetaminophen (TYLENOL) suppository 650 mg  650 mg Rectal Q6H PRN Hillary Bow, DO       alum & mag hydroxide-simeth (MAALOX/MYLANTA) 200-200-20 MG/5ML suspension 30 mL  30 mL Oral Q6H PRN Karie Soda, MD       amiodarone (PACERONE) tablet 200 mg  200 mg Oral Daily Hillary Bow,  DO   200 mg at 05/17/23 0935   bisacodyl (DULCOLAX) suppository 10 mg  10 mg Rectal Q12H PRN Karie Soda, MD       diphenhydrAMINE (BENADRYL) injection 12.5-25 mg  12.5-25 mg Intravenous Q6H PRN Karie Soda, MD       HYDROmorphone (DILAUDID) injection 0.5 mg  0.5 mg Intravenous Q4H PRN Lewie Chamber, MD       lactated ringers bolus 1,000 mL  1,000 mL Intravenous Q8H PRN Karie Soda, MD       lactated ringers infusion   Intravenous Continuous Lewie Chamber, MD 75 mL/hr at 05/17/23 0751 Restarted at 05/17/23 0751   magic mouthwash  15 mL Oral QID PRN Karie Soda, MD       menthol-cetylpyridinium (CEPACOL) lozenge 3 mg  1 lozenge Oral PRN Karie Soda, MD       methocarbamol (ROBAXIN) 1,000 mg in dextrose 5 % 100 mL IVPB  1,000 mg Intravenous Q6H PRN Karie Soda, MD       naphazoline-glycerin (CLEAR EYES REDNESS) ophth  solution 1-2 drop  1-2 drop Both Eyes QID PRN Karie Soda, MD       ondansetron [facility]) injection 4 mg  4 mg Intravenous Q6H PRN Karie Soda, MD       Or   ondansetron (ZOFRAN) 8 mg in sodium chloride 0.9 % 50 mL IVPB  8 mg Intravenous Q6H PRN Karie Soda, MD       ondansetron [facility]) tablet 4 mg  4 mg Oral Q6H PRN Hillary Bow, DO       pantoprazole (PROTONIX) injection 40 mg  40 mg Intravenous Q12H May, Deanna J, NP       phenol (CHLORASEPTIC) mouth spray 2 spray  2 spray Mouth/Throat PRN Karie Soda, MD       prochlorperazine (COMPAZINE) injection 5-10 mg  5-10 mg Intravenous Q4H PRN Karie Soda, MD       simethicone (MYLICON) 40 MG/0.6ML suspension 80 mg  80 mg Oral QID PRN Karie Soda, MD       sodium bicarbonate tablet 1,950 mg  1,950 mg Oral BID Lyda Perone M, DO   1,950 mg at 05/17/23 0935   sodium chloride (OCEAN) 0.65 % nasal spray 1-2 spray  1-2 spray Each Nare Q6H PRN Karie Soda, MD         Allergies  Allergen Reactions   Lorazepam Other (See Comments)    Hallucinations    Other Other (See Comments)    Cannot have ct scans with dye due to kidney function   Humira [Adalimumab] Other (See Comments)    Pt states that he got pancreatitis because of this    Colchicine Other (See Comments)    Cramps    Quinolones Other (See Comments)    Patient was warned about not using Cipro and similar antibiotics. Recent studies have raised concern that fluoroquinolone antibiotics could be associated with an increased risk of aortic aneurysm Fluoroquinolones have non-antimicrobial properties that might jeopardise the integrity of the extracellular matrix of the vascular wall In a  propensity score matched cohort study in [place], there was a 66% increased rate of aortic aneurysm or dissection associated with oral fluoroquinolone use, compared wit    ROS:   All other systems reviewed & are negative except per HPI or as noted below: Constitutional:  No fevers, chills,  sweats.  Weight stable Eyes:  No vision changes, No discharge HENT:  No sore throats, nasal drainage Lymph: No neck swelling, No bruising easily Pulmonary:  No cough, productive sputum CV: No orthopnea, PND  Patient walks 20 minutes without difficulty.  No exertional chest/neck/shoulder/arm pain.  GI:  No personal nor family history of irritable bowel syndrome, allergy such as Celiac Sprue, dietary/dairy problems, colitis, ulcers nor gastritis.  No recent sick contacts/gastroenteritis.  No travel outside the country.  No changes in diet.  Renal: No UTIs, No hematuria Genital:  No drainage, bleeding, masses Musculoskeletal: No severe joint pain.  Good ROM major joints Skin:  No sores or lesions Heme/Lymph:  No easy bleeding.  No swollen lymph nodes   BP 132/65 (BP Location: Right Arm)   Pulse (!) 59   Temp 98 F (36.7 C) (Oral)   Resp 20   Ht 5\' 10"  (1.778 m)   Wt 86.1 kg   SpO2 93%   BMI 27.24 kg/m   Physical Exam:  Constitutional: Not cachectic.  Hygeine adequate.  Vitals signs as above.   Eyes: Pupils reactive, normal extraocular movements. Sclera nonicteric Neuro: CN II-XII intact.  No major focal sensory defects.  No major motor deficits. Lymph: No head/neck/groin lymphadenopathy Psych:  No severe agitation.  No severe anxiety.  Judgment & insight Adequate, Oriented x4, HENT: Normocephalic, Mucus membranes moist.  No thrush.   Neck: Supple, No tracheal deviation.  No obvious thyromegaly Chest: No pain to chest wall compression.  Good respiratory excursion.  No audible wheezing CV:  Pulses intact.  regular rhythm.  No major extremity edema  Abdomen:  Flat Hernia: Not present. Diastasis recti: Mild supraumbilical midline. Soft.   Moderately distended.  Nontender.  No hepatomegaly.  No splenomegaly  Gen:  Inguinal hernia: Not present.  Inguinal lymph nodes: without lymphadenopathy.    Rectal: (Deferred)  Ext: No obvious deformity or contracture.  Edema: Not present.  No  cyanosis Skin: No major subcutaneous nodules.  Warm and dry Musculoskeletal: Severe joint rigidity not present.  No obvious clubbing.  No digital petechiae.     Results:   Labs: Results for orders placed or performed during the hospital encounter of 05/16/23 (from the past 48 hour(s))  Comprehensive metabolic panel     Status: Abnormal   Collection Time: 05/16/23  1:38 AM  Result Value Ref Range   Sodium 138 135 - 145 mmol/L   Potassium 4.9 3.5 - 5.1 mmol/L   Chloride 105 98 - 111 mmol/L   CO2 23 22 - 32 mmol/L   Glucose, Bld 123 (H) 70 - 99 mg/dL    Comment: Glucose reference range applies only to samples taken after fasting for at least 8 hours.   BUN 17 8 - 23 mg/dL   Creatinine, Ser 0.27 (H) 0.61 - 1.24 mg/dL   Calcium 8.7 (L) 8.9 - 10.3 mg/dL   Total Protein 6.1 (L) 6.5 - 8.1 g/dL   Albumin 3.6 3.5 - 5.0 g/dL   AST 29 15 - 41 U/L   ALT 28 0 - 44 U/L   Alkaline Phosphatase 132 (H) 38 - 126 U/L   Total Bilirubin 0.6 0.3 - 1.2 mg/dL   GFR, Estimated 30 (L) >60 mL/min    Comment: (NOTE) Calculated using the CKD-EPI Creatinine Equation (2021)    Anion gap 10 5 - 15    Comment: Performed at [facility], [address], [place], [place] [address]  Lipase, blood     Status: None   Collection Time: 05/16/23  1:38 AM  Result Value Ref Range   Lipase 33 11 - 51 U/L    Comment:  Performed at [facility], [address], [place], [place] [address]  CBC with Differential     Status: Abnormal   Collection Time: 05/16/23  1:38 AM  Result Value Ref Range   WBC 4.6 4.0 - 10.5 K/uL   RBC 3.12 (L) 4.22 - 5.81 MIL/uL   Hemoglobin 9.3 (L) 13.0 - 17.0 g/dL   HCT 96.2 (L) 95.2 - 84.1 %   MCV 93.3 80.0 - 100.0 fL   MCH 29.8 26.0 - 34.0 pg   MCHC 32.0 30.0 - 36.0 g/dL   RDW 32.4 (H) 40.1 - 02.7 %   Platelets 129 (L) 150 - 400 K/uL   nRBC 0.0 0.0 - 0.2 %   Neutrophils Relative % 81 %   Neutro Abs 3.8 1.7 - 7.7 K/uL   Lymphocytes Relative 11 %    Lymphs Abs 0.5 (L) 0.7 - 4.0 K/uL   Monocytes Relative 5 %   Monocytes Absolute 0.2 0.1 - 1.0 K/uL   Eosinophils Relative 2 %   Eosinophils Absolute 0.1 0.0 - 0.5 K/uL   Basophils Relative 1 %   Basophils Absolute 0.0 0.0 - 0.1 K/uL   Immature Granulocytes 0 %   Abs Immature Granulocytes 0.02 0.00 - 0.07 K/uL    Comment: Performed at [facility], [address], [place], [place] [address]  Magnesium     Status: None   Collection Time: 05/16/23  1:38 AM  Result Value Ref Range   Magnesium 1.7 1.7 - 2.4 mg/dL    Comment: Performed at [facility], [address] [place], [place] [address]  I-Stat Lactic Acid     Status: Abnormal   Collection Time: 05/16/23  1:53 AM  Result Value Ref Range   Lactic Acid, Venous 2.0 (HH) 0.5 - 1.9 mmol/L   Comment NOTIFIED PHYSICIAN   Urinalysis, Routine w reflex microscopic -Urine, Clean Catch     Status: None   Collection Time: 05/16/23 11:34 AM  Result Value Ref Range   Color, Urine YELLOW YELLOW   APPearance CLEAR CLEAR   Specific Gravity, Urine 1.013 1.005 - 1.030   pH 7.0 5.0 - 8.0   Glucose, UA NEGATIVE NEGATIVE mg/dL   Hgb urine dipstick NEGATIVE NEGATIVE   Bilirubin Urine NEGATIVE NEGATIVE   Ketones, ur NEGATIVE NEGATIVE mg/dL   Protein, ur NEGATIVE NEGATIVE mg/dL   Nitrite NEGATIVE NEGATIVE   Leukocytes,Ua NEGATIVE NEGATIVE    Comment: Performed at [facility], [address] [place], [place] [address]  CBC     Status: Abnormal   Collection Time: 05/17/23  4:19 AM  Result Value Ref Range   WBC 4.2 4.0 - 10.5 K/uL   RBC 3.16 (L) 4.22 - 5.81 MIL/uL   Hemoglobin 9.3 (L) 13.0 - 17.0 g/dL   HCT 56.3 (L) 87.5 - 64.3 %   MCV 94.6 80.0 - 100.0 fL   MCH 29.4 26.0 - 34.0 pg   MCHC 31.1 30.0 - 36.0 g/dL   RDW 32.9 (H) 51.8 - 84.1 %   Platelets 122 (L) 150 - 400 K/uL    Comment: REPEATED TO VERIFY   nRBC 0.0 0.0 - 0.2 %    Comment: Performed at [facility], [address], [place], [place] [address]  Basic metabolic panel     Status: Abnormal   Collection Time: 05/17/23  4:19 AM  Result Value Ref Range   Sodium 136 135 - 145 mmol/L   Potassium 4.8 3.5 -  5.1 mmol/L   Chloride 104 98 - 111 mmol/L   CO2 26 22 - 32 mmol/L   Glucose, Bld 109 (H) 70 - 99 mg/dL    Comment: Glucose reference range applies only to samples taken after fasting for at least 8 hours.   BUN 18 8 - 23 mg/dL   Creatinine, Ser 1.61 (H) 0.61 - 1.24 mg/dL   Calcium 8.0 (L) 8.9 - 10.3 mg/dL   GFR, Estimated 37 (L) >60 mL/min    Comment: (NOTE) Calculated using the CKD-EPI Creatinine Equation (2021)    Anion gap 6 5 - 15    Comment: Performed at [facility], [address] [place], [place] [address]  Magnesium     Status: None   Collection Time: 05/17/23  4:19 AM  Result Value Ref Range   Magnesium 1.7 1.7 - 2.4 mg/dL    Comment: Performed at [facility], [address] [place], [place] [address]    Imaging / Studies: DG Abd 1 View  Result Date: 05/16/2023 CLINICAL DATA:  [id number] Encounter for imaging study to confirm nasogastric (NG) tube placement [id number]. EXAM: ABDOMEN - 1 VIEW COMPARISON:  CT abdomen/pelvis 05/16/2023. FINDINGS: Enteric tube tip and side port project over the stomach. Unchanged diffuse gaseous distention of the small bowel, consistent with small-bowel obstruction. IMPRESSION: Enteric tube tip and side port project over the stomach. Electronically Signed   By: Orvan Falconer M.D.   On: 05/16/2023 12:59   CT ABDOMEN PELVIS WO CONTRAST  Result Date: 05/16/2023 CLINICAL DATA:  Severe abdominal pain.  Bowel obstruction suspected EXAM: CT ABDOMEN AND PELVIS WITHOUT CONTRAST TECHNIQUE: Multidetector CT imaging of the abdomen and pelvis was performed following the standard protocol without IV contrast. RADIATION DOSE REDUCTION: This exam was performed according to the departmental dose-optimization program which includes  automated exposure control, adjustment of the mA and/or kV according to patient size and/or use of iterative reconstruction technique. COMPARISON:  03/27/2021 FINDINGS: Lower chest: Coronary artery and aortic atherosclerosis. Heart normal size. No effusions. Hepatobiliary: Prior cholecystectomy. Scattered calcifications. No suspicious focal hepatic abnormality or biliary ductal dilatation. Pancreas: No focal abnormality or ductal dilatation. Spleen: No focal abnormality.  Normal size. Adrenals/Urinary Tract: Multiple 1-2 mm stones in the right kidney. No ureteral stones or hydronephrosis. Exophytic lesion off the superior pole of the left kidney measures 2 cm and is stable dating back to prior study most compatible with cyst. 2.5 cm exophytic lesion off the midpole of the right kidney, also stable since prior study compatible with cyst. No follow-up imaging recommended. Adrenal glands and urinary bladder unremarkable. Stomach/Bowel: Numerous dilated small bowel loops measuring up to 5 cm in diameter. Changes of prior right hemicolectomy. Distal small bowel decompressed. Findings compatible with high-grade small bowel obstruction. Exact transition not visualized, presumably adhesions related. Stomach unremarkable. Vascular/Lymphatic: Aortic atherosclerosis. No evidence of aneurysm or adenopathy. Reproductive: No visible focal abnormality. Other: No free fluid or free air. Musculoskeletal: No acute bony abnormality. IMPRESSION: Prior right hemicolectomy. Dilated small bowel loop into the pelvis with decompressed distal small bowel. Findings compatible with high-grade small bowel obstruction. Right punctate nephrolithiasis.  No hydronephrosis. Coronary artery disease, aortic atherosclerosis. Electronically Signed   By: Charlett Nose M.D.   On: 05/16/2023 02:16    Medications / Allergies: per chart  Antibiotics: Anti-infectives (From admission, onward)    None         Note: Portions of this report may have  been transcribed using voice recognition software. Every  effort was made to ensure accuracy; however, inadvertent computerized transcription errors may be present.   Any transcriptional errors that result from this process are unintentional.    Ardeth Sportsman, MD, FACS, MASCRS Esophageal, Gastrointestinal & Colorectal Surgery Robotic and Minimally Invasive Surgery  [facility] [address], [address] [place], [place] [address] [phone] Fax [phone] Main  CONTACT INFORMATION: Weekday (9AM-5PM): Call [facility] main office at [phone] Weeknight (5PM-9AM) or Weekend/Holiday: Check EPIC "Web Links" tab & use "AMION" (password " [email or web address]") for General Surgery [facility] coverage  Please, DO NOT use SecureChat  (it is not reliable communication to reach operating surgeons & will lead to a delay in care).   Epic staff messaging available for outptient concerns needing 1-2 business day response.       05/17/2023  10:34 AM

## 2023-05-17 NOTE — Progress Notes (Addendum)
Progress Note  Primary GI: Dr. Leone Payor  LOS: 0 days   Chief Complaint:Small bowel obstruction   Subjective   Patient lying in bed, NG tube in place with approximately 350cc green output since yesterday afternoon. He  reports he has had 4 large unformed bowel movements since last night. No blood in stool. The generalized abdominal discomfort and distention has improved significantly with NG tube. Mild nausea relieved with Ondansetron. He reports he has been walking the halls. No new complaints.  No family was present at the time of my evaluation.   Objective   Vital signs in last 24 hours: Temp:  [98 F (36.7 C)-99.1 F (37.3 C)] 98 F (36.7 C) (10/14 0535) Pulse Rate:  [58-60] 59 (10/14 0535) Resp:  [18-20] 20 (10/14 0535) BP: (125-148)/(59-81) 132/65 (10/14 0535) SpO2:  [93 %-98 %] 93 % (10/14 0535) Last BM Date : 05/17/23 Last BM recorded by nurses in past 5 days Stool Type: Type 7 (Liquid consistency with no solid pieces) (05/17/2023  4:56 AM)  General:   male in no acute distress  Heart:  Regular rate and rhythm; no murmurs Pulm: Clear anteriorly; no wheezing Abdomen: soft,  mildly distended, hypoactive bowel sounds in all quadrants. Mild generalized tenderness with palpation, without guarding. No organomegaly appreciated. Extremities:  No edema Neurologic:  Alert and  oriented x4;  No focal deficits.  Psych:  Cooperative. Normal mood and affect.  Intake/Output from previous day: 10/13 0701 - 10/14 0700 In: 1168.2 [I.V.:1138.2; NG/GT:30] Out: 1650 [Urine:600; Emesis/NG output:1050] Intake/Output this shift: No intake/output data recorded.  Studies/Results: DG Abd 1 View  Result Date: 05/16/2023 CLINICAL DATA:  [id number] Encounter for imaging study to confirm nasogastric (NG) tube placement [id number]. EXAM: ABDOMEN - 1 VIEW COMPARISON:  CT abdomen/pelvis 05/16/2023. FINDINGS: Enteric tube tip and side port project over the stomach. Unchanged diffuse gaseous distention  of the small bowel, consistent with small-bowel obstruction. IMPRESSION: Enteric tube tip and side port project over the stomach. Electronically Signed   By: Orvan Falconer M.D.   On: 05/16/2023 12:59   CT ABDOMEN PELVIS WO CONTRAST  Result Date: 05/16/2023 CLINICAL DATA:  Severe abdominal pain.  Bowel obstruction suspected EXAM: CT ABDOMEN AND PELVIS WITHOUT CONTRAST TECHNIQUE: Multidetector CT imaging of the abdomen and pelvis was performed following the standard protocol without IV contrast. RADIATION DOSE REDUCTION: This exam was performed according to the departmental dose-optimization program which includes automated exposure control, adjustment of the mA and/or kV according to patient size and/or use of iterative reconstruction technique. COMPARISON:  03/27/2021 FINDINGS: Lower chest: Coronary artery and aortic atherosclerosis. Heart normal size. No effusions. Hepatobiliary: Prior cholecystectomy. Scattered calcifications. No suspicious focal hepatic abnormality or biliary ductal dilatation. Pancreas: No focal abnormality or ductal dilatation. Spleen: No focal abnormality.  Normal size. Adrenals/Urinary Tract: Multiple 1-2 mm stones in the right kidney. No ureteral stones or hydronephrosis. Exophytic lesion off the superior pole of the left kidney measures 2 cm and is stable dating back to prior study most compatible with cyst. 2.5 cm exophytic lesion off the midpole of the right kidney, also stable since prior study compatible with cyst. No follow-up imaging recommended. Adrenal glands and urinary bladder unremarkable. Stomach/Bowel: Numerous dilated small bowel loops measuring up to 5 cm in diameter. Changes of prior right hemicolectomy. Distal small bowel decompressed. Findings compatible with high-grade small bowel obstruction. Exact transition not visualized, presumably adhesions related. Stomach unremarkable. Vascular/Lymphatic: Aortic atherosclerosis. No evidence of aneurysm or adenopathy.  Reproductive: No visible focal  abnormality. Other: No free fluid or free air. Musculoskeletal: No acute bony abnormality. IMPRESSION: Prior right hemicolectomy. Dilated small bowel loop into the pelvis with decompressed distal small bowel. Findings compatible with high-grade small bowel obstruction. Right punctate nephrolithiasis.  No hydronephrosis. Coronary artery disease, aortic atherosclerosis. Electronically Signed   By: Charlett Nose M.D.   On: 05/16/2023 02:16    Lab Results: Recent Labs    05/16/23 0138 05/17/23 0419  WBC 4.6 4.2  HGB 9.3* 9.3*  HCT 29.1* 29.9*  PLT 129* 122*   BMET Recent Labs    05/16/23 0138 05/17/23 0419  NA 138 136  K 4.9 4.8  CL 105 104  CO2 23 26  GLUCOSE 123* 109*  BUN 17 18  CREATININE 2.19* 1.84*  CALCIUM 8.7* 8.0*   LFT Recent Labs    05/16/23 0138  PROT 6.1*  ALBUMIN 3.6  AST 29  ALT 28  ALKPHOS 132*  BILITOT 0.6   PT/INR No results for input(s): "LABPROT", "INR" in the last 72 hours.   Scheduled Meds:  amiodarone  200 mg Oral Daily   pantoprazole  40 mg Oral BID   Followed by   Melene Muller ON 06/08/2023] pantoprazole  40 mg Oral Daily   sodium bicarbonate  1,950 mg Oral BID   Continuous Infusions:  lactated ringers 75 mL/hr at 05/17/23 0751   magnesium sulfate bolus IVPB 2 g (05/17/23 0752)     Patient Narrative:  82 year old man  with history of ileocolonic Crohn's disease under good control after surgery in 2017, little if any activity and last colonoscopy in 2022, currently on no Crohn's treatment, history of colon cancer status post resection who came to the ED on 10/13 for progressive abdominal pain and distention with inability to pass gas.He was evaluated and found to have a high-grade SBO on CT, suspected to be related to adhesions.   Impression/Plan:  82 year old male patient with history of ileocolonic Crohn's, colon cancer,multiple abdominal surgeries, and prior SBO. He presents to ED with complaints of abdominal pain  and distention without ability to pass gas. CT scan shows high grade SBO, suspected to be from adhesions.   High-grade small bowel obstruction (uncomplicated), most likely due to adhesions from prior surgeries. No s/s ischemia. History of Crohn's disease- little if no activity- not on treatment. History of colon cancer, no recurrence on subsequent surveillance colonoscopies. Pt reports some improvement today, had 4 BM's since last night. NG tube 350cc output since 1400. Hypoactive bowel sounds x4. WBC 4.2 BUN 18 - Continue NG tube for decompression - Follow-up Abd KUB tomorrow -NPO, Lennyn Gange consider Clears in afternoon if having BM's and continues to improve -IV fluids -pain management and antiemetics per hospitalists -encourage ambulating the halls -consider CT enterography outpt in 4 weeks  Acute on chronic anemia. Hgb 9.3. Baseline 8-9. EGD on 10/5 showed one non- bleeding cratered duodenal ulcer. PPI therapy increased to BID. Pt has been taking as prescribed. No overt bleeding. -Switched to IV Pantoprazole 40mg  BID  Thrombocytopenia. Plt 122. Baseline 110s.  CKD stage IIIb BUN 18/Creat 1.84 -Avoid Nephrotoxic Medications   Paroxysmal atrial fibrillation -on amiodarone and Eliquis   Principal Problem:   SBO (small bowel obstruction) (HCC) Active Problems:   Stage 3b chronic kidney disease (CKD) (HCC)   Thrombocytopenia (HCC)   Paroxysmal atrial fibrillation (HCC)   Personal history of colon cancer   Duodenal ulcer   Lachell Rochette J Geniene List  05/17/2023, 8:42 AM

## 2023-05-18 ENCOUNTER — Ambulatory Visit: Payer: Medicare PPO | Admitting: Internal Medicine

## 2023-05-18 ENCOUNTER — Inpatient Hospital Stay (HOSPITAL_COMMUNITY): Payer: No Typology Code available for payment source

## 2023-05-18 DIAGNOSIS — K56609 Unspecified intestinal obstruction, unspecified as to partial versus complete obstruction: Secondary | ICD-10-CM | POA: Diagnosis not present

## 2023-05-18 DIAGNOSIS — Z8719 Personal history of other diseases of the digestive system: Secondary | ICD-10-CM | POA: Diagnosis not present

## 2023-05-18 DIAGNOSIS — I480 Paroxysmal atrial fibrillation: Secondary | ICD-10-CM | POA: Diagnosis not present

## 2023-05-18 LAB — BASIC METABOLIC PANEL
Anion gap: 8 (ref 5–15)
BUN: 16 mg/dL (ref 8–23)
CO2: 27 mmol/L (ref 22–32)
Calcium: 8.4 mg/dL — ABNORMAL LOW (ref 8.9–10.3)
Chloride: 102 mmol/L (ref 98–111)
Creatinine, Ser: 1.95 mg/dL — ABNORMAL HIGH (ref 0.61–1.24)
GFR, Estimated: 34 mL/min — ABNORMAL LOW (ref >60)
Glucose, Bld: 104 mg/dL — ABNORMAL HIGH (ref 70–99)
Potassium: 4.4 mmol/L (ref 3.5–5.1)
Sodium: 137 mmol/L (ref 135–145)

## 2023-05-18 LAB — CBC
HCT: 30.8 % — ABNORMAL LOW (ref 39.0–52.0)
Hemoglobin: 9.5 g/dL — ABNORMAL LOW (ref 13.0–17.0)
MCH: 29.4 pg (ref 26.0–34.0)
MCHC: 30.8 g/dL (ref 30.0–36.0)
MCV: 95.4 fL (ref 80.0–100.0)
Platelets: 124 10*3/uL — ABNORMAL LOW (ref 150–400)
RBC: 3.23 MIL/uL — ABNORMAL LOW (ref 4.22–5.81)
RDW: 16 % — ABNORMAL HIGH (ref 11.5–15.5)
WBC: 4.7 10*3/uL (ref 4.0–10.5)
nRBC: 0 % (ref 0.0–0.2)

## 2023-05-18 LAB — MAGNESIUM: Magnesium: 1.9 mg/dL (ref 1.7–2.4)

## 2023-05-18 MED ORDER — FEBUXOSTAT 40 MG PO TABS
80.0000 mg | ORAL_TABLET | Freq: Every morning | ORAL | Status: DC
  Administered 2023-05-19: 80 mg via ORAL
  Filled 2023-05-18: qty 2

## 2023-05-18 MED ORDER — PAROXETINE HCL 20 MG PO TABS
20.0000 mg | ORAL_TABLET | Freq: Every morning | ORAL | Status: DC
  Administered 2023-05-18 – 2023-05-19 (×2): 20 mg via ORAL
  Filled 2023-05-18 (×2): qty 1

## 2023-05-18 MED ORDER — LEVOTHYROXINE SODIUM 25 MCG PO TABS
25.0000 ug | ORAL_TABLET | Freq: Every day | ORAL | Status: DC
  Administered 2023-05-19: 25 ug via ORAL
  Filled 2023-05-18: qty 1

## 2023-05-18 NOTE — Assessment & Plan Note (Addendum)
-   ESR and CRP normal; agree, speaks to supporting quiescent CD - managed by GI - possible CT enterography outpatient with GI in ~4 weeks

## 2023-05-18 NOTE — Assessment & Plan Note (Signed)
-   Continue Protonix

## 2023-05-18 NOTE — Progress Notes (Signed)
Subjective: CC: No current abdominal pain. NGT clamped. 200cc out from NGT over the last 24 hours. Some nausea that he thinks is from ngt. No vomiting. Passing flatus. Liquid bm yesterday and today. Reports this feels similar to prior sbo's and feels like it is getting better.   Objective: Vital signs in last 24 hours: Temp:  [97.5 F (36.4 C)-98.5 F (36.9 C)] 97.6 F (36.4 C) (10/15 0448) Pulse Rate:  [63-65] 63 (10/15 0448) Resp:  [17-20] 17 (10/15 0448) BP: (138-158)/(67-78) 153/67 (10/15 0448) SpO2:  [97 %-99 %] 97 % (10/15 0448) Last BM Date : 05/17/23  Intake/Output from previous day: 10/14 0701 - 10/15 0700 In: 1771.3 [I.V.:1661.3; IV Piggyback:110] Out: 200 [Emesis/NG output:200] Intake/Output this shift: No intake/output data recorded.  PE: Gen:  Alert, NAD, pleasant Abd: Soft, mild distension, NT, +BS. NGT clamped.   Lab Results:  Recent Labs    05/17/23 0419 05/18/23 0441  WBC 4.2 4.7  HGB 9.3* 9.5*  HCT 29.9* 30.8*  PLT 122* 124*   BMET Recent Labs    05/17/23 0419 05/18/23 0441  NA 136 137  K 4.8 4.4  CL 104 102  CO2 26 27  GLUCOSE 109* 104*  BUN 18 16  CREATININE 1.84* 1.95*  CALCIUM 8.0* 8.4*   PT/INR No results for input(s): "LABPROT", "INR" in the last 72 hours. CMP     Component Value Date/Time   NA 137 05/18/2023 0441   NA 142 12/25/2019 1042   NA 139 11/23/2012 1047   K 4.4 05/18/2023 0441   K 4.5 11/23/2012 1047   CL 102 05/18/2023 0441   CL 103 11/23/2012 1047   CO2 27 05/18/2023 0441   CO2 26 11/23/2012 1047   GLUCOSE 104 (H) 05/18/2023 0441   GLUCOSE 91 11/23/2012 1047   BUN 16 05/18/2023 0441   BUN 15 12/25/2019 1042   BUN 25.3 11/23/2012 1047   CREATININE 1.95 (H) 05/18/2023 0441   CREATININE 1.8 (H) 09/25/2015 1020   CREATININE 1.9 (H) 11/23/2012 1047   CALCIUM 8.4 (L) 05/18/2023 0441   CALCIUM 8.8 11/23/2012 1047   PROT 6.1 (L) 05/16/2023 0138   PROT 6.6 12/25/2019 1042   PROT 6.7 11/23/2012 1047    ALBUMIN 3.6 05/16/2023 0138   ALBUMIN 4.4 12/25/2019 1042   ALBUMIN 3.4 (L) 11/23/2012 1047   AST 29 05/16/2023 0138   AST 28 11/23/2012 1047   ALT 28 05/16/2023 0138   ALT 43 11/23/2012 1047   ALKPHOS 132 (H) 05/16/2023 0138   ALKPHOS 150 11/23/2012 1047   BILITOT 0.6 05/16/2023 0138   BILITOT 0.5 12/25/2019 1042   BILITOT 0.74 11/23/2012 1047   GFRNONAA 34 (L) 05/18/2023 0441   GFRAA 41 (L) 12/25/2019 1042   Lipase     Component Value Date/Time   LIPASE 33 05/16/2023 0138    Studies/Results: DG Abd Portable 1V-Small Bowel Obstruction Protocol-initial, 8 hr delay  Result Date: 05/17/2023 CLINICAL DATA:  8 hour delay for small-bowel obstruction EXAM: PORTABLE ABDOMEN - 1 VIEW COMPARISON:  05/17/2023 at 9:34 a.m. FINDINGS: Images are obtained 8 hours after administration of contrast material via enteric tube. Contrast material is demonstrated in the descending colon and rectum suggesting no evidence of high-grade bowel obstruction. Visualized bowel gas pattern is normal. IMPRESSION: Contrast material is demonstrated in the descending colon and rectum suggesting no evidence of high-grade bowel obstruction. Electronically Signed   By: Burman Nieves M.D.   On: 05/17/2023 21:54   DG  Abd Portable 1V-Small Bowel Protocol-Position Verification  Result Date: 05/17/2023 CLINICAL DATA:  [id number] Encounter for imaging study to confirm nasogastric (NG) tube placement [id number] EXAM: PORTABLE ABDOMEN - 1 VIEW COMPARISON:  05/16/2023 FINDINGS: Limited radiograph of the lower chest and upper abdomen was obtained for the purposes of enteric tube localization. Enteric tube is seen coursing below the diaphragm with distal tip and side port terminating within the expected location of the gastric body. Densely calcified splenic artery. IMPRESSION: Enteric tube terminates within the expected location of the gastric body. Electronically Signed   By: Duanne Guess D.O.   On: 05/17/2023 13:29   DG Abd 1  View  Result Date: 05/16/2023 CLINICAL DATA:  [id number] Encounter for imaging study to confirm nasogastric (NG) tube placement [id number]. EXAM: ABDOMEN - 1 VIEW COMPARISON:  CT abdomen/pelvis 05/16/2023. FINDINGS: Enteric tube tip and side port project over the stomach. Unchanged diffuse gaseous distention of the small bowel, consistent with small-bowel obstruction. IMPRESSION: Enteric tube tip and side port project over the stomach. Electronically Signed   By: Orvan Falconer M.D.   On: 05/16/2023 12:59    Anti-infectives: Anti-infectives (From admission, onward)    None      Remote ileocectomy for Crohn's disease around 1978, then required reoperation soon after that Ileostomy reversal - 07/23/2017 - [facility] Standing Transverse colectomy - 08/14/2016 for transverse colon cancer ---> right hemicolectomy and ileostomy for an ischemic leak - 08/27/2016 - [facility] Standing              Assessment/Plan SBO - CT w/ Dilated small bowel loop into the pelvis with decompressed distal small bowel. Patient with hx of multiple prior surgeries as stated above. He has a hx of Crohn's. He has been seen by GI and does not feel this is 2/2 active crohn's.  - HDS without fever, tachycardia or hypotension. No peritonitis on exam. WBC wnl. No current indication for emergency surgery - Keep K > 4, Mg > 2 and mobilize as able for bowel function - Hopefully patient will improve with conservative management. If patient fails to improve with conservative management, they may require exploratory surgery during admission - Clinically improving. Contrast in colon and having bowel function. Will clamp ngt and give cld. If tolerates, plan d/c of ngt later today. Discussed with [facility]  FEN - NGT clamped. CLD. IVF per [facility] VTE - SCDs, Lovenox ID - None  I reviewed nursing notes, last 24 h vitals and pain scores, last 48 h intake and output, last 24 h labs and trends, and last 24 h imaging results.   LOS: 1 day    Jacinto Halim , [facility] Surgery 05/18/2023, 10:03 AM Please see Amion for pager number during day hours 7:00am-4:30pm

## 2023-05-18 NOTE — Progress Notes (Addendum)
Progress Note  Primary GI: Dr. Leone Payor  LOS: 1 day   Chief Complaint:Small bowel obstruction   Subjective    Pt lying in bed, NG tube clamped. 250cc green output since yesterday morning. Reports he is feeling much better and back to his norm of 6 unformed stools per day. No blood per rectum. Mild nausea. Started on Clear liquids today to see if he can tolerate. No new complaints.  No family was present at the time of my evaluation.   Objective   Vital signs in last 24 hours: Temp:  [97.5 F (36.4 C)-98.5 F (36.9 C)] 97.6 F (36.4 C) (10/15 0448) Pulse Rate:  [63-65] 63 (10/15 0448) Resp:  [17-20] 17 (10/15 0448) BP: (138-158)/(67-78) 153/67 (10/15 0448) SpO2:  [97 %-99 %] 97 % (10/15 0448) Last BM Date : 05/17/23 Last BM recorded by nurses in past 5 days Stool Type: Type 7 (Liquid consistency with no solid pieces) (05/17/2023 11:20 PM)  General:   male in no acute distress  Heart:  Regular rate and rhythm; no murmurs EENT: irritation noted to left eye, NG tube in nare, clamped. Pulm: Clear anteriorly; no wheezing Abdomen: soft,  mildly distended, hypoactive bowel sounds in all quadrants. No tenderness with palpation, without guarding. No organomegaly appreciated. Extremities:  No edema Neurologic:  Alert and  oriented x4;  No focal deficits.  Psych:  Cooperative. Normal mood and affect.  Intake/Output from previous day: 10/14 0701 - 10/15 0700 In: 1771.3 [I.V.:1661.3; IV Piggyback:110] Out: 200 [Emesis/NG output:200] Intake/Output this shift: No intake/output data recorded.  Studies/Results: DG Abd Portable 1V-Small Bowel Obstruction Protocol-initial, 8 hr delay  Result Date: 05/17/2023 CLINICAL DATA:  8 hour delay for small-bowel obstruction EXAM: PORTABLE ABDOMEN - 1 VIEW COMPARISON:  05/17/2023 at 9:34 a.m. FINDINGS: Images are obtained 8 hours after administration of contrast material via enteric tube. Contrast material is demonstrated in the descending colon  and rectum suggesting no evidence of high-grade bowel obstruction. Visualized bowel gas pattern is normal. IMPRESSION: Contrast material is demonstrated in the descending colon and rectum suggesting no evidence of high-grade bowel obstruction. Electronically Signed   By: Burman Nieves M.D.   On: 05/17/2023 21:54   DG Abd Portable 1V-Small Bowel Protocol-Position Verification  Result Date: 05/17/2023 CLINICAL DATA:  [id number] Encounter for imaging study to confirm nasogastric (NG) tube placement [id number] EXAM: PORTABLE ABDOMEN - 1 VIEW COMPARISON:  05/16/2023 FINDINGS: Limited radiograph of the lower chest and upper abdomen was obtained for the purposes of enteric tube localization. Enteric tube is seen coursing below the diaphragm with distal tip and side port terminating within the expected location of the gastric body. Densely calcified splenic artery. IMPRESSION: Enteric tube terminates within the expected location of the gastric body. Electronically Signed   By: Duanne Guess D.O.   On: 05/17/2023 13:29   DG Abd 1 View  Result Date: 05/16/2023 CLINICAL DATA:  [id number] Encounter for imaging study to confirm nasogastric (NG) tube placement [id number]. EXAM: ABDOMEN - 1 VIEW COMPARISON:  CT abdomen/pelvis 05/16/2023. FINDINGS: Enteric tube tip and side port project over the stomach. Unchanged diffuse gaseous distention of the small bowel, consistent with small-bowel obstruction. IMPRESSION: Enteric tube tip and side port project over the stomach. Electronically Signed   By: Orvan Falconer M.D.   On: 05/16/2023 12:59    Lab Results: Recent Labs    05/16/23 0138 05/17/23 0419 05/18/23 0441  WBC 4.6 4.2 4.7  HGB 9.3* 9.3* 9.5*  HCT 29.1* 29.9* 30.8*  PLT 129* 122* 124*   BMET Recent Labs    05/16/23 0138 05/17/23 0419 05/18/23 0441  NA 138 136 137  K 4.9 4.8 4.4  CL 105 104 102  CO2 23 26 27   GLUCOSE 123* 109* 104*  BUN 17 18 16   CREATININE 2.19* 1.84* 1.95*  CALCIUM 8.7* 8.0* 8.4*    LFT Recent Labs    05/16/23 0138  PROT 6.1*  ALBUMIN 3.6  AST 29  ALT 28  ALKPHOS 132*  BILITOT 0.6   PT/INR No results for input(s): "LABPROT", "INR" in the last 72 hours.   Scheduled Meds:  amiodarone  200 mg Oral Daily   enoxaparin (LOVENOX) injection  40 mg Subcutaneous Daily   pantoprazole (PROTONIX) IV  40 mg Intravenous Q12H   Continuous Infusions:  methocarbamol (ROBAXIN) IV 1,000 mg (05/17/23 2355)     Patient Narrative:  82 year old man  with history of ileocolonic Crohn's disease under good control after surgery in 2017, little if any activity and last colonoscopy in 2022, currently on no Crohn's treatment, history of colon cancer status post resection who came to the ED on 10/13 for progressive abdominal pain and distention with inability to pass gas.He was evaluated and found to have a high-grade SBO on CT, suspected to be related to adhesions.   Impression/Plan:  82 year old male patient with history of ileocolonic Crohn's, colon cancer,multiple abdominal surgeries, and prior SBO. He presented to ED with complaints of abdominal pain, distention without ability to pass gas. CT scan shows high grade SBO, suspected to be from adhesions.   High-grade small bowel obstruction (uncomplicated), most likely due to adhesions from prior surgeries. No s/s ischemia. History of Crohn's disease- little if no activity- not on treatment. Inflammatory markers normal. History of colon cancer, no recurrence on subsequent surveillance colonoscopies. Pt reports he is back to his norm of 6 unformed stools per day. Nausea and abd pain has improved. No evidence of high grade bowel obstruction on xray yesterday. WBC 4.7 BUN 16 - Ng tube clamped 6 hour trial on Clears, if tolerates per surgery advance diet, anticipate possible discharge tomorrow or Thursday -  follow-up Abd KUB tomorrow -IV fluids -pain management and antiemetics per hospitalists -No plan for endoscopic reevaluation  currently.  -encourage ambulating the halls -consider CT enterography outpt in 4 weeks  Acute on chronic anemia. Hgb 9.5. Baseline 8-9. EGD on 10/5 showed one non- bleeding cratered duodenal ulcer. PPI therapy increased to BID. Pt has been taking as prescribed. No overt bleeding. -Continue IV Pantoprazole 40mg  BID  Thrombocytopenia. Plt 124. Baseline 110s.  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

## 2023-05-18 NOTE — Progress Notes (Signed)
Mobility Specialist - Progress Note   05/18/23 0947  Mobility  Activity Ambulated independently in hallway  Level of Assistance Independent  Assistive Device None  Distance Ambulated (ft) 500 ft  Activity Response Tolerated well  Mobility Referral Yes  $Mobility charge 1 Mobility  Mobility Specialist Start Time (ACUTE ONLY) [id number]  Mobility Specialist Stop Time (ACUTE ONLY) 0946  Mobility Specialist Time Calculation (min) (ACUTE ONLY) 7 min   Pt received in bed and agreeable to mobility. No complaints during session. Pt to bed after session with all needs met.    [facility]

## 2023-05-18 NOTE — Progress Notes (Signed)
Progress Note    Derek [name]   EAV:[id number]  DOB: 1941/06/09  DOA: 05/16/2023     1 PCP: Assunta Found, MD  Initial CC: abd pain, N/V  Hospital Course: Derek [name] is an 82 yo male with PMH Crohn's disease, not on current treatment, colon cancer s/p resection, prior SBO, PAF on Eliquis, CKD3b who presented with abdominal pain and nausea/vomiting. CT abdomen/pelvis was performed which showed prior right hemicolectomy and dilated small bowel loop into the pelvis with decompressed distal small bowel concerning for small bowel obstruction. He underwent NG tube placement and was admitted for surgery evaluation.  Interval History:  Decreased NGT output since admission. Undergoing clamp trial today then possible CLD. Ambulating well and still have BMs he says.   Assessment and Plan: * SBO (small bowel obstruction) (HCC) - At risk for adhesions and further obstructions due to prior history of abdominal surgeries, history of Crohn's -Continue NG tube per surgery; undergoing clamp trial today so might be removed later if passes - CLD trial today per surgery - continue IVF for now until diet advances  History of Crohn's disease - ESR and CRP normal; agree, speaks to supporting quiescent CD - managed by GI - possible CT enterography outpatient with GI in ~4 weeks  Paroxysmal atrial fibrillation (HCC) Cont home amiodarone. Looks like eliquis was to be resumed on discharge after GIB admit earlier this month - hold for now in case of need for intervention; resume as able  CKD (chronic kidney disease) stage 4, GFR 15-29 ml/min (HCC) - patient has history of CKD3b. Baseline creat ~ 1.8 - 2, eGFR~ 35 - at baseline - continue IVF  Duodenal ulcer Cont BID PPI for the remainder of month (then back to once daily) as per GI recs from prior admit.  Thrombocytopenia (HCC) - chronically low; baseline appears around 100-120k - remains at baseline  Anxiety - Resume Paxil  GERD -  Continue Protonix   Old records reviewed in assessment of this patient  Antimicrobials:   DVT prophylaxis:  enoxaparin (LOVENOX) injection 40 mg Start: 05/18/23 1000   Code Status:   Code Status: Full Code  Mobility Assessment (Last 72 Hours)     Mobility Assessment     Row Name 05/18/23 1005 05/17/23 2200 05/17/23 0749 05/16/23 2040 05/16/23 0958   Does patient have an order for bedrest or is patient medically unstable No - Continue assessment No - Continue assessment No - Continue assessment No - Continue assessment No - Continue assessment   What is the highest level of mobility based on the progressive mobility assessment? Level 6 (Walks independently in room and hall) - Balance while walking in room without assist - Complete Level 6 (Walks independently in room and hall) - Balance while walking in room without assist - Complete Level 6 (Walks independently in room and hall) - Balance while walking in room without assist - Complete Level 6 (Walks independently in room and hall) - Balance while walking in room without assist - Complete Level 6 (Walks independently in room and hall) - Balance while walking in room without assist - Complete    Row Name 05/16/23 0630           Does patient have an order for bedrest or is patient medically unstable No - Continue assessment       What is the highest level of mobility based on the progressive mobility assessment? Level 6 (Walks independently in room and hall) - Balance while walking in  room without assist - Complete                Barriers to discharge: none Disposition Plan:  Home Status is: Inpt  Objective: Blood pressure (!) 153/67, pulse 63, temperature 97.6 F (36.4 C), temperature source Oral, resp. rate 17, height 5\' 10"  (1.778 m), weight 86.1 kg, SpO2 97%.  Examination:  Physical Exam Constitutional:      General: He is not in acute distress.    Appearance: Normal appearance.  HENT:     Head: Normocephalic and  atraumatic.     Mouth/Throat:     Mouth: Mucous membranes are moist.  Eyes:     Extraocular Movements: Extraocular movements intact.  Cardiovascular:     Rate and Rhythm: Normal rate and regular rhythm.  Pulmonary:     Effort: Pulmonary effort is normal. No respiratory distress.     Breath sounds: Normal breath sounds. No wheezing.  Abdominal:     General: Bowel sounds are normal. There is no distension.     Palpations: Abdomen is soft.     Tenderness: There is no abdominal tenderness.  Musculoskeletal:        General: Normal range of motion.     Cervical back: Normal range of motion and neck supple.  Skin:    General: Skin is warm and dry.  Neurological:     General: No focal deficit present.     Mental Status: He is alert.  Psychiatric:        Mood and Affect: Mood normal.        Behavior: Behavior normal.      Consultants:  GI General surgery  Procedures:    Data Reviewed: Results for orders placed or performed during the hospital encounter of 05/16/23 (from the past 24 hour(s))  CBC     Status: Abnormal   Collection Time: 05/18/23  4:41 AM  Result Value Ref Range   WBC 4.7 4.0 - 10.5 K/uL   RBC 3.23 (L) 4.22 - 5.81 MIL/uL   Hemoglobin 9.5 (L) 13.0 - 17.0 g/dL   HCT 16.1 (L) 09.6 - 04.5 %   MCV 95.4 80.0 - 100.0 fL   MCH 29.4 26.0 - 34.0 pg   MCHC 30.8 30.0 - 36.0 g/dL   RDW 40.9 (H) 81.1 - 91.4 %   Platelets 124 (L) 150 - 400 K/uL   nRBC 0.0 0.0 - 0.2 %  Basic metabolic panel     Status: Abnormal   Collection Time: 05/18/23  4:41 AM  Result Value Ref Range   Sodium 137 135 - 145 mmol/L   Potassium 4.4 3.5 - 5.1 mmol/L   Chloride 102 98 - 111 mmol/L   CO2 27 22 - 32 mmol/L   Glucose, Bld 104 (H) 70 - 99 mg/dL   BUN 16 8 - 23 mg/dL   Creatinine, Ser 7.82 (H) 0.61 - 1.24 mg/dL   Calcium 8.4 (L) 8.9 - 10.3 mg/dL   GFR, Estimated 34 (L) >60 mL/min   Anion gap 8 5 - 15  Magnesium     Status: None   Collection Time: 05/18/23  4:41 AM  Result Value Ref  Range   Magnesium 1.9 1.7 - 2.4 mg/dL    I have reviewed pertinent nursing notes, vitals, labs, and images as necessary. I have ordered labwork to follow up on as indicated.  I have reviewed the last notes from staff over past 24 hours. I have discussed patient's care plan and test results with  nursing staff, CM/SW, and other staff as appropriate.  Time spent: Greater than 50% of the 55 minute visit was spent in counseling/coordination of care for the patient as laid out in the A&P.   LOS: 1 day   Lewie Chamber, MD [facility] 05/18/2023, 12:53 PM

## 2023-05-18 NOTE — Progress Notes (Signed)
   05/18/23 0835  TOC Brief Assessment  Insurance and Status Reviewed  Patient has primary care physician Yes  Home environment has been reviewed home with spouse  Prior level of function: independent  Prior/Current Home Services No current home services  Social Determinants of Health Reivew SDOH reviewed no interventions necessary  Readmission risk has been reviewed Yes  Transition of care needs no transition of care needs at this time

## 2023-05-19 ENCOUNTER — Inpatient Hospital Stay (HOSPITAL_COMMUNITY): Payer: No Typology Code available for payment source

## 2023-05-19 DIAGNOSIS — K56609 Unspecified intestinal obstruction, unspecified as to partial versus complete obstruction: Secondary | ICD-10-CM | POA: Diagnosis not present

## 2023-05-19 DIAGNOSIS — Z8719 Personal history of other diseases of the digestive system: Secondary | ICD-10-CM | POA: Diagnosis not present

## 2023-05-19 LAB — BASIC METABOLIC PANEL
Anion gap: 12 (ref 5–15)
BUN: 13 mg/dL (ref 8–23)
CO2: 22 mmol/L (ref 22–32)
Calcium: 8.8 mg/dL — ABNORMAL LOW (ref 8.9–10.3)
Chloride: 105 mmol/L (ref 98–111)
Creatinine, Ser: 1.81 mg/dL — ABNORMAL HIGH (ref 0.61–1.24)
GFR, Estimated: 37 mL/min — ABNORMAL LOW (ref >60)
Glucose, Bld: 117 mg/dL — ABNORMAL HIGH (ref 70–99)
Potassium: 4 mmol/L (ref 3.5–5.1)
Sodium: 139 mmol/L (ref 135–145)

## 2023-05-19 LAB — MAGNESIUM: Magnesium: 1.7 mg/dL (ref 1.7–2.4)

## 2023-05-19 MED ORDER — PANTOPRAZOLE SODIUM 40 MG PO TBEC
40.0000 mg | DELAYED_RELEASE_TABLET | Freq: Two times a day (BID) | ORAL | Status: DC
  Administered 2023-05-19: 40 mg via ORAL
  Filled 2023-05-19: qty 1

## 2023-05-19 MED ORDER — PANTOPRAZOLE SODIUM 40 MG PO TBEC: 40.0000 mg | DELAYED_RELEASE_TABLET | Freq: Every day | ORAL | Status: DC

## 2023-05-19 NOTE — Discharge Summary (Signed)
Physician Discharge Summary   Patient: Derek [name] MRN: [id number] DOB: [birth year]  Admit date:     05/16/2023  Discharge date: 05/19/23  Discharge Physician: Alberteen Sam   PCP: Assunta Found, MD     Recommendations at discharge:  Follow up with PCP Dr. Phillips Odor in 1 week for small bowel obstruction Follow up with GI Dr. Leone Payor for Crohn's disease and duodenal ulcer CT enterography per GI     Discharge Diagnoses: Principal Problem:   SBO (small bowel obstruction) (HCC) Active Problems:   Paroxysmal atrial fibrillation (HCC)   History of Crohn's disease   CKD (chronic kidney disease) stage 4, GFR 15-29 ml/min (HCC)   Duodenal ulcer   CORONARY ATHEROSCLEROSIS NATIVE CORONARY ARTERY   GERD   Anxiety   Thrombocytopenia (HCC)   Chronic anticoagulation   Personal history of pT2pN0 transverse colon cancer s/p colectomy Jan 2018   Hypothyroidism   Hepatitis C, chronic (HCC)   Insomnia   Pulmonary fibrosis, unspecified (HCC)   Gastropathy   History of colon cancer   Small bowel obstruction [facility])      Hospital Course: [name] is an 82 yo male with PMH Crohn's disease, not on current treatment, colon cancer s/p resection, prior SBO, PAF on Eliquis, CKD3b who presented with abdominal pain and nausea/vomiting.  CT abdomen/pelvis was performed which showed prior right hemicolectomy and dilated small bowel loop into the pelvis with decompressed distal small bowel concerning for small bowel obstruction. He underwent NG tube placement and was admitted for surgery evaluation.      * SBO (small bowel obstruction) (HCC) Admitted and NG tube placed.  General surgery consulted.  Patient did well, advance his diet, was able to have the tube removed, had bowel movement, and tolerated solid diet well prior to discharge.  GI were consulted, and there was consensus that is symptoms were from adhesion related small bowel obstruction, not flare of Crohn's  disease    History of Crohn's disease ESR and CRP normal.  Follow-up with GI as needed.  Paroxysmal atrial fibrillation (HCC) Eliquis resumed at discharge.  Continued on amiodarone.  CKD (chronic kidney disease) stage IIIb, GFR 15-29 ml/min (HCC) CKD4 ruled out.  Baseline creatinine 1.8-2.  Creatinine stable here.  Duodenal ulcer Patient had just recently (1 week prior) been admitted for melena.  GI were consulted and patient underwent EGD that showed a duodenal ulcer.  Cont BID PPI for the remainder of month (then back to once daily) as per GI recs from prior admit.  Thrombocytopenia (HCC) Chronically low; baseline appears around 100-120k  Anxiety On Paxil  GERD On Protonix            The [facility] Controlled Substances Registry was reviewed for this patient prior to discharge.  Consultants: GI, General Surgery    Disposition: Home Diet recommendation:  Regular diet  DISCHARGE MEDICATION: Allergies as of 05/19/2023       Reactions   Lorazepam Other (See Comments)   Hallucinations    Other Other (See Comments)   Cannot have ct scans with dye due to kidney function   Humira [adalimumab] Other (See Comments)   Pt states that he got pancreatitis because of this    Colchicine Other (See Comments)   Cramps   Quinolones Other (See Comments)   Patient was warned about not using Cipro and similar antibiotics. Recent studies have raised concern that fluoroquinolone antibiotics could be associated with an increased risk of aortic aneurysm Fluoroquinolones have non-antimicrobial properties  that might jeopardise the integrity of the extracellular matrix of the vascular wall In a  propensity score matched cohort study in [place], there was a 66% increased rate of aortic aneurysm or dissection associated with oral fluoroquinolone use, compared wit        Medication List     TAKE these medications    Acidophilus Lactobacillus Caps Take 1 capsule by mouth  daily.   amiodarone 200 MG tablet Commonly known as: PACERONE Take 1 tablet (200 mg total) by mouth daily.   calcium carbonate 500 MG chewable tablet Commonly known as: TUMS - dosed in mg elemental calcium Chew 2 tablets by mouth as needed for indigestion or heartburn.   cyanocobalamin 1000 MCG tablet Take 1 tablet (1,000 mcg total) by mouth daily.   Eliquis 5 MG Tabs tablet Generic drug: apixaban Take 1 tablet (5 mg total) by mouth 2 (two) times daily. What changed:  how much to take when to take this   famotidine 40 MG tablet Commonly known as: PEPCID Take 40 mg by mouth daily before breakfast.   fluticasone 50 MCG/ACT nasal spray Commonly known as: FLONASE Place 2 sprays into both nostrils as needed for allergies or rhinitis.   levothyroxine 25 MCG tablet Commonly known as: SYNTHROID Take 25 mcg by mouth daily before breakfast.   Magnesium Oxide 420 MG Tabs Take 1,260 mg by mouth in the morning and at bedtime.   multivitamin with minerals Tabs tablet Take 1 tablet by mouth daily with breakfast.   ondansetron 4 MG tablet Commonly known as: ZOFRAN Take 4 mg by mouth as needed for nausea or vomiting.   pantoprazole 40 MG tablet Commonly known as: PROTONIX Take 1 tablet (40 mg total) by mouth 2 (two) times daily for 30 days, THEN 1 tablet (40 mg total) daily. Start taking on: May 09, 2023   PARoxetine 40 MG tablet Commonly known as: PAXIL Take 20 mg by mouth in the morning.   rosuvastatin 10 MG tablet Commonly known as: Crestor Take 1 tablet (10 mg total) by mouth daily. What changed: how much to take   sodium bicarbonate 650 MG tablet Take 1,950 mg by mouth in the morning and at bedtime.   sucralfate 1 GM/10ML suspension Commonly known as: CARAFATE Take 1 g by mouth as needed (to coat the stomach).   traZODone 150 MG tablet Commonly known as: DESYREL Take 150 mg by mouth at bedtime.   TYLENOL 500 MG tablet Generic drug: acetaminophen Take 1,000  mg by mouth as needed for mild pain or headache.   Uloric 80 MG Tabs Generic drug: Febuxostat Take 80 mg by mouth in the morning.   Vitamin D 50 MCG (2000 UT) Caps Take 2,000 Units by mouth daily.        Follow-up Information     Assunta Found, MD. Schedule an appointment as soon as possible for a visit in 1 week(s).   Specialty: Family Medicine Contact information: [address] [place] [address] [phone]         Iva Boop, MD. Schedule an appointment as soon as possible for a visit in 1 month(s).   Specialty: Gastroenterology Contact information: [address] [place] [address] [phone]         Iva Boop, MD Follow up on 07/08/2023.   Specialty: Gastroenterology Why: at 9:50 am. Please arrive 10 minutes early Contact information: [address] [place] [address] [phone]  Discharge Instructions     Discharge instructions   Complete by: As directed    **IMPORTANT DISCHARGE INSTRUCTIONS**   From Dr. Maryfrances Bunnell: You were admitted for a small bowel obstruction  You were evaluated with CT abdomen and pelvis (a "Cat scan") that showed the obstruction and no other concerning complications  Dr. Marvell Fuller partners from [facility] evaluated you and felt there was no active Crohn's disease.  They recommend you follow up in their office in 1-2 months.  You were also evaluated by the surgeons, thankfully, no surgery was needed and your bowel obstructions resolved with a nasogastric tube and time.  We recommend a daily fiber bowel regimen with Metamucil or MiraLAX to avoid future events.  Go see Dr. Phillips Odor in 1 week for labs recheck.   Increase activity slowly   Complete by: As directed        Discharge Exam: Filed Weights   05/16/23 0630  Weight: 86.1 kg    General: Pt is alert, awake, not in acute distress Cardiovascular: RRR, nl S1-S2, no murmurs appreciated.   No LE edema.    Respiratory: Normal respiratory rate and rhythm.  CTAB without rales or wheezes. Abdominal: Abdomen soft and non-tender.  No distension or HSM.   Neuro/Psych: Strength symmetric in upper and lower extremities.  Judgment and insight appear normal.   Condition at discharge: good  The results of significant diagnostics from this hospitalization (including imaging, microbiology, ancillary and laboratory) are listed below for reference.   Imaging Studies: DG Abd 1 View  Result Date: 05/19/2023 CLINICAL DATA:  Warm [id number] with small bowel obstruction. EXAM: ABDOMEN - 1 VIEW COMPARISON:  Flat plate abdomen study yesterday at 10:18 a.m., flat plate abdomen 42/59/5638 FINDINGS: No dilated bowel is evident. There are right upper quadrant surgical clips. Interval NGT removal or interval pullback above the plane of the study. No radio-opaque urinary calculi or other significant radiographic abnormality are seen. There are calcifications within the tortuous splenic artery. Calcified pelvic phleboliths. IMPRESSION: No dilated bowel is evident. Interval NGT removal or interval pullback above the plane of the study. Electronically Signed   By: Almira Bar M.D.   On: 05/19/2023 07:13   DG Abd 1 View  Result Date: 05/18/2023 CLINICAL DATA:  Small bowel obstruction (HCC) EXAM: ABDOMEN - 1 VIEW COMPARISON:  October 14 24. FINDINGS: Gastric tube in the distal stomach or proximal duodenum. Bowel gas pattern is similar. No residual bowel gas pattern. Right upper quadrant clips. No obvious free air on the supine radiographs. No abnormal calcifications. Calcified phleboliths in the anatomic pelvis. IMPRESSION: Gastric tube in the distal stomach or proximal duodenum. Bowel gas pattern is similar. No residual oral contrast. Electronically Signed   By: Feliberto Harts M.D.   On: 05/18/2023 14:26   DG Abd Portable 1V-Small Bowel Obstruction Protocol-initial, 8 hr delay  Result Date: 05/17/2023 CLINICAL DATA:  8 hour  delay for small-bowel obstruction EXAM: PORTABLE ABDOMEN - 1 VIEW COMPARISON:  05/17/2023 at 9:34 a.m. FINDINGS: Images are obtained 8 hours after administration of contrast material via enteric tube. Contrast material is demonstrated in the descending colon and rectum suggesting no evidence of high-grade bowel obstruction. Visualized bowel gas pattern is normal. IMPRESSION: Contrast material is demonstrated in the descending colon and rectum suggesting no evidence of high-grade bowel obstruction. Electronically Signed   By: Burman Nieves M.D.   On: 05/17/2023 21:54   DG Abd Portable 1V-Small Bowel Protocol-Position Verification  Result Date: 05/17/2023 CLINICAL DATA:  [id number] Encounter  for imaging study to confirm nasogastric (NG) tube placement [id number] EXAM: PORTABLE ABDOMEN - 1 VIEW COMPARISON:  05/16/2023 FINDINGS: Limited radiograph of the lower chest and upper abdomen was obtained for the purposes of enteric tube localization. Enteric tube is seen coursing below the diaphragm with distal tip and side port terminating within the expected location of the gastric body. Densely calcified splenic artery. IMPRESSION: Enteric tube terminates within the expected location of the gastric body. Electronically Signed   By: Duanne Guess D.O.   On: 05/17/2023 13:29   DG Abd 1 View  Result Date: 05/16/2023 CLINICAL DATA:  [id number] Encounter for imaging study to confirm nasogastric (NG) tube placement [id number]. EXAM: ABDOMEN - 1 VIEW COMPARISON:  CT abdomen/pelvis 05/16/2023. FINDINGS: Enteric tube tip and side port project over the stomach. Unchanged diffuse gaseous distention of the small bowel, consistent with small-bowel obstruction. IMPRESSION: Enteric tube tip and side port project over the stomach. Electronically Signed   By: Orvan Falconer M.D.   On: 05/16/2023 12:59   CT ABDOMEN PELVIS WO CONTRAST  Result Date: 05/16/2023 CLINICAL DATA:  Severe abdominal pain.  Bowel obstruction suspected EXAM: CT  ABDOMEN AND PELVIS WITHOUT CONTRAST TECHNIQUE: Multidetector CT imaging of the abdomen and pelvis was performed following the standard protocol without IV contrast. RADIATION DOSE REDUCTION: This exam was performed according to the departmental dose-optimization program which includes automated exposure control, adjustment of the mA and/or kV according to patient size and/or use of iterative reconstruction technique. COMPARISON:  03/27/2021 FINDINGS: Lower chest: Coronary artery and aortic atherosclerosis. Heart normal size. No effusions. Hepatobiliary: Prior cholecystectomy. Scattered calcifications. No suspicious focal hepatic abnormality or biliary ductal dilatation. Pancreas: No focal abnormality or ductal dilatation. Spleen: No focal abnormality.  Normal size. Adrenals/Urinary Tract: Multiple 1-2 mm stones in the right kidney. No ureteral stones or hydronephrosis. Exophytic lesion off the superior pole of the left kidney measures 2 cm and is stable dating back to prior study most compatible with cyst. 2.5 cm exophytic lesion off the midpole of the right kidney, also stable since prior study compatible with cyst. No follow-up imaging recommended. Adrenal glands and urinary bladder unremarkable. Stomach/Bowel: Numerous dilated small bowel loops measuring up to 5 cm in diameter. Changes of prior right hemicolectomy. Distal small bowel decompressed. Findings compatible with high-grade small bowel obstruction. Exact transition not visualized, presumably adhesions related. Stomach unremarkable. Vascular/Lymphatic: Aortic atherosclerosis. No evidence of aneurysm or adenopathy. Reproductive: No visible focal abnormality. Other: No free fluid or free air. Musculoskeletal: No acute bony abnormality. IMPRESSION: Prior right hemicolectomy. Dilated small bowel loop into the pelvis with decompressed distal small bowel. Findings compatible with high-grade small bowel obstruction. Right punctate nephrolithiasis.  No  hydronephrosis. Coronary artery disease, aortic atherosclerosis. Electronically Signed   By: Charlett Nose M.D.   On: 05/16/2023 02:16    Microbiology: Results for orders placed or performed during the hospital encounter of 03/29/21  Blood Culture (routine x 2)     Status: None   Collection Time: 03/29/21  2:45 AM   Specimen: Right Antecubital; Blood  Result Value Ref Range Status   Specimen Description RIGHT ANTECUBITAL  Final   Special Requests   Final    BOTTLES DRAWN AEROBIC AND ANAEROBIC Blood Culture results may not be optimal due to an excessive volume of blood received in culture bottles   Culture   Final    NO GROWTH 5 DAYS Performed at [facility], [address] [place], [place] [address]    Report Status 04/03/2021  FINAL  Final  Blood Culture (routine x 2)     Status: None   Collection Time: 03/29/21  2:47 AM   Specimen: BLOOD LEFT HAND  Result Value Ref Range Status   Specimen Description BLOOD LEFT HAND  Final   Special Requests   Final    BOTTLES DRAWN AEROBIC AND ANAEROBIC Blood Culture adequate volume   Culture   Final    NO GROWTH 5 DAYS Performed at [facility], [address], [place], [place] [address]    Report Status 04/03/2021 FINAL  Final  Resp Panel by RT-PCR (Flu A&B, Covid) Nasopharyngeal Swab     Status: Abnormal   Collection Time: 03/29/21  3:42 AM   Specimen: Nasopharyngeal Swab; Nasopharyngeal(NP) swabs in vial transport medium  Result Value Ref Range Status   SARS Coronavirus 2 by RT PCR POSITIVE (A) NEGATIVE Final    Comment: RESULT CALLED TO, READ BACK BY AND VERIFIED WITH: EVANS,H @ 0444 ON 03/29/21 BY JUW (NOTE) SARS-CoV-2 target nucleic acids are DETECTED.  The SARS-CoV-2 RNA is generally detectable in upper respiratory specimens during the acute phase of infection. Positive results are indicative of the presence of the identified virus, but do not rule out bacterial infection or co-infection with other pathogens not detected by the test.  Clinical correlation with patient history and other diagnostic information is necessary to determine patient infection status. The expected result is Negative.  Fact Sheet for Patients: BloggerCourse.com  Fact Sheet for Healthcare Providers: SeriousBroker.it  This test is not yet approved or cleared by the [place] FDA and  has been authorized for detection and/or diagnosis of SARS-CoV-2 by FDA under an Emergency Use Authorization (EUA).  This EUA will remain in effect (meaning this test can be  used) for the duration of  the COVID-19 declaration under Section 564(b)(1) of the Act, 21 U.S.C. section 360bbb-3(b)(1), unless the authorization is terminated or revoked sooner.     Influenza A by PCR NEGATIVE NEGATIVE Final   Influenza B by PCR NEGATIVE NEGATIVE Final    Comment: (NOTE) The Xpert Xpress SARS-CoV-2/FLU/RSV plus assay is intended as an aid in the diagnosis of influenza from Nasopharyngeal swab specimens and should not be used as a sole basis for treatment. Nasal washings and aspirates are unacceptable for Xpert Xpress SARS-CoV-2/FLU/RSV testing.  Fact Sheet for Patients: BloggerCourse.com  Fact Sheet for Healthcare Providers: SeriousBroker.it  This test is not yet approved or cleared by the [place] FDA and has been authorized for detection and/or diagnosis of SARS-CoV-2 by FDA under an Emergency Use Authorization (EUA). This EUA will remain in effect (meaning this test can be used) for the duration of the COVID-19 declaration under Section 564(b)(1) of the Act, 21 U.S.C. section 360bbb-3(b)(1), unless the authorization is terminated or revoked.  Performed at [facility], [address] [place], [place] [address]     Labs: CBC: Recent Labs  Lab 05/16/23 0138 05/17/23 0419 05/18/23 0441  WBC 4.6 4.2 4.7  NEUTROABS 3.8  --   --   HGB 9.3* 9.3* 9.5*   HCT 29.1* 29.9* 30.8*  MCV 93.3 94.6 95.4  PLT 129* 122* 124*   Basic Metabolic Panel: Recent Labs  Lab 05/16/23 0138 05/17/23 0419 05/18/23 0441 05/19/23 0429  NA 138 136 137 139  K 4.9 4.8 4.4 4.0  CL 105 104 102 105  CO2 23 26 27 22   GLUCOSE 123* 109* 104* 117*  BUN 17 18 16 13   CREATININE 2.19* 1.84* 1.95* 1.81*  CALCIUM 8.7* 8.0* 8.4* 8.8*  MG 1.7 1.7 1.9 1.7   Liver Function Tests: Recent Labs  Lab 05/16/23 0138  AST 29  ALT 28  ALKPHOS 132*  BILITOT 0.6  PROT 6.1*  ALBUMIN 3.6   CBG: No results for input(s): "GLUCAP" in the last 168 hours.  Discharge time spent: approximately 35 minutes spent on discharge counseling, evaluation of patient on day of discharge, and coordination of discharge planning with nursing, social work, pharmacy and case management  Signed: Alberteen Sam, MD [facility] 05/19/2023

## 2023-05-19 NOTE — Progress Notes (Signed)
Daily Progress Note  DOA: 05/16/2023 Hospital Day: 4  Chief Complaint: SBO  ASSESSMENT    Brief Narrative:  SOTA HETZ is a 82 y.o. year old male with a history of PAF on Eliquis, CKD 3b, PUD,  ileocolonic Crohn's disease under good control after surgery in 2017, little if any activity and last colonoscopy in 2022, currently on no Crohn's treatment, history of colon cancer status post resection Admitted 10/13 with SBO. GI consulted 10/13  High grade SBO.  CT scan >> Numerous dilated small bowel loops measuring up to 5 cm in diameter. Distal small bowel decompressed. Findings compatible with high-grade small bowel obstruction. Most likely 2/2 to adhesions from prior abdominal surgeries *Interval history:  Today's KUB is negative. He is asymptomatic.   Duodenal ulcer. Non-bleeding DU without stigmata of bleeding on EGD 10/5 done for melena  PAF, on Eliquis   Principal Problem:   SBO (small bowel obstruction) (HCC) Active Problems:   CORONARY ATHEROSCLEROSIS NATIVE CORONARY ARTERY   GERD   Anxiety   CKD (chronic kidney disease) stage 4, GFR 15-29 ml/min (HCC)   Thrombocytopenia (HCC)   Paroxysmal atrial fibrillation (HCC)   Chronic anticoagulation   Personal history of pT2pN0 transverse colon cancer s/p colectomy Jan 2018   Hypothyroidism   Duodenal ulcer   Hepatitis C, chronic (HCC)   Insomnia   Pulmonary fibrosis, unspecified (HCC)   Gastropathy   History of colon cancer   History of Crohn's disease   Small bowel obstruction (HCC)   PLAN   --Continue BID PPI for another 3 weeks then once daily indefinitely --He is stable for discharge from GI standpoint --Follow up with Dr. Leone Payor in office on 12/5 at 9:50 am   Subjective   Passing flatus. Has had 3 BMs today Tolerating soft diet , ordering lunch right now.   Objective     Recent Labs    05/17/23 0419 05/18/23 0441  WBC 4.2 4.7  HGB 9.3* 9.5*  HCT 29.9* 30.8*  PLT 122* 124*    BMET Recent Labs    05/17/23 0419 05/18/23 0441 05/19/23 0429  NA 136 137 139  K 4.8 4.4 4.0  CL 104 102 105  CO2 26 27 22   GLUCOSE 109* 104* 117*  BUN 18 16 13   CREATININE 1.84* 1.95* 1.81*  CALCIUM 8.0* 8.4* 8.8*   LFT No results for input(s): "PROT", "ALBUMIN", "AST", "ALT", "ALKPHOS", "BILITOT", "BILIDIR", "IBILI" in the last 72 hours. PT/INR No results for input(s): "LABPROT", "INR" in the last 72 hours.   Imaging:  DG Abd 1 View CLINICAL DATA:  Warm [id number] with small bowel obstruction.  EXAM: ABDOMEN - 1 VIEW  COMPARISON:  Flat plate abdomen study yesterday at 10:18 a.m., flat plate abdomen 60/45/4098  FINDINGS: No dilated bowel is evident. There are right upper quadrant surgical clips.  Interval NGT removal or interval pullback above the plane of the study.  No radio-opaque urinary calculi or other significant radiographic abnormality are seen.  There are calcifications within the tortuous splenic artery. Calcified pelvic phleboliths.  IMPRESSION: No dilated bowel is evident. Interval NGT removal or interval pullback above the plane of the study.  Electronically Signed   By: Almira Bar M.D.   On: 05/19/2023 07:13     Scheduled inpatient medications:   amiodarone  200 mg Oral Daily   enoxaparin (LOVENOX) injection  40 mg Subcutaneous Daily   febuxostat  80 mg Oral q AM   levothyroxine  25 mcg Oral  QAC breakfast   pantoprazole  40 mg Oral BID   PARoxetine  20 mg Oral q AM   Continuous inpatient infusions:   methocarbamol (ROBAXIN) IV 1,000 mg (05/17/23 2355)   PRN inpatient medications: acetaminophen **OR** acetaminophen, alum & mag hydroxide-simeth, bisacodyl, diphenhydrAMINE, HYDROmorphone (DILAUDID) injection, magic mouthwash, menthol-cetylpyridinium, methocarbamol (ROBAXIN) IV, naphazoline-glycerin, [DISCONTINUED] ondansetron (ZOFRAN) IV **OR** ondansetron (ZOFRAN) IV, phenol, prochlorperazine, simethicone, sodium chloride  Vital  signs in last 24 hours: Temp:  [98.5 F (36.9 C)-99.5 F (37.5 C)] 98.5 F (36.9 C) (10/16 0554) Pulse Rate:  [59-106] 78 (10/16 0946) Resp:  [16-18] 16 (10/16 0554) BP: (122-135)/(72-87) 122/87 (10/16 0554) SpO2:  [97 %-100 %] 100 % (10/16 0946) Last BM Date : 05/19/23  Intake/Output Summary (Last 24 hours) at 05/19/2023 1037 Last data filed at 05/19/2023 1000 Gross per 24 hour  Intake 780 ml  Output 0 ml  Net 780 ml    Intake/Output from previous day: 10/15 0701 - 10/16 0700 In: 540 [P.O.:540] Out: 600 [Emesis/NG output:600] Intake/Output this shift: Total I/O In: 240 [P.O.:240] Out: -    Physical Exam:  General: Alert male in NAD Pulmonary: Normal respiratory effort Abdomen: Soft, nondistended, nontender. Normal bowel sounds. Extremities: No lower extremity edema  Neurologic: Alert and oriented Psych: Pleasant. Cooperative. Insight appears normal.      LOS: 2 days   Willette Cluster ,NP 05/19/2023, 10:37 AM

## 2023-05-19 NOTE — Progress Notes (Addendum)
Subjective: CC: No current abdominal pain. Tolerating dys1 diet but doesn't like the options so hasn't eaten much. No PRN pain or anti-nausea medication since diet was advanced yesterday. No n/v. Passing flatus. BM today. Afebrile.   Objective: Vital signs in last 24 hours: Temp:  [98.5 F (36.9 C)-99.5 F (37.5 C)] 98.5 F (36.9 C) (10/16 0554) Pulse Rate:  [59-106] 106 (10/16 0554) Resp:  [16-18] 16 (10/16 0554) BP: (122-135)/(72-87) 122/87 (10/16 0554) SpO2:  [97 %-98 %] 98 % (10/16 0554) Last BM Date : 05/19/23 RN rechecking HR - tachycardia resolved.   Intake/Output from previous day: 10/15 0701 - 10/16 0700 In: 540 [P.O.:540] Out: 600 [Emesis/NG output:600] Intake/Output this shift: No intake/output data recorded.  PE: Gen:  Alert, NAD, pleasant Abd: Soft, ND, NT, +BS.   Lab Results:  Recent Labs    05/17/23 0419 05/18/23 0441  WBC 4.2 4.7  HGB 9.3* 9.5*  HCT 29.9* 30.8*  PLT 122* 124*   BMET Recent Labs    05/18/23 0441 05/19/23 0429  NA 137 139  K 4.4 4.0  CL 102 105  CO2 27 22  GLUCOSE 104* 117*  BUN 16 13  CREATININE 1.95* 1.81*  CALCIUM 8.4* 8.8*   PT/INR No results for input(s): "LABPROT", "INR" in the last 72 hours. CMP     Component Value Date/Time   NA 139 05/19/2023 0429   NA 142 12/25/2019 1042   NA 139 11/23/2012 1047   K 4.0 05/19/2023 0429   K 4.5 11/23/2012 1047   CL 105 05/19/2023 0429   CL 103 11/23/2012 1047   CO2 22 05/19/2023 0429   CO2 26 11/23/2012 1047   GLUCOSE 117 (H) 05/19/2023 0429   GLUCOSE 91 11/23/2012 1047   BUN 13 05/19/2023 0429   BUN 15 12/25/2019 1042   BUN 25.3 11/23/2012 1047   CREATININE 1.81 (H) 05/19/2023 0429   CREATININE 1.8 (H) 09/25/2015 1020   CREATININE 1.9 (H) 11/23/2012 1047   CALCIUM 8.8 (L) 05/19/2023 0429   CALCIUM 8.8 11/23/2012 1047   PROT 6.1 (L) 05/16/2023 0138   PROT 6.6 12/25/2019 1042   PROT 6.7 11/23/2012 1047   ALBUMIN 3.6 05/16/2023 0138   ALBUMIN 4.4 12/25/2019  1042   ALBUMIN 3.4 (L) 11/23/2012 1047   AST 29 05/16/2023 0138   AST 28 11/23/2012 1047   ALT 28 05/16/2023 0138   ALT 43 11/23/2012 1047   ALKPHOS 132 (H) 05/16/2023 0138   ALKPHOS 150 11/23/2012 1047   BILITOT 0.6 05/16/2023 0138   BILITOT 0.5 12/25/2019 1042   BILITOT 0.74 11/23/2012 1047   GFRNONAA 37 (L) 05/19/2023 0429   GFRAA 41 (L) 12/25/2019 1042   Lipase     Component Value Date/Time   LIPASE 33 05/16/2023 0138    Studies/Results: DG Abd 1 View  Result Date: 05/19/2023 CLINICAL DATA:  Warm [address] with small bowel obstruction. EXAM: ABDOMEN - 1 VIEW COMPARISON:  Flat plate abdomen study yesterday at 10:18 a.m., flat plate abdomen 60/45/4098 FINDINGS: No dilated bowel is evident. There are right upper quadrant surgical clips. Interval NGT removal or interval pullback above the plane of the study. No radio-opaque urinary calculi or other significant radiographic abnormality are seen. There are calcifications within the tortuous splenic artery. Calcified pelvic phleboliths. IMPRESSION: No dilated bowel is evident. Interval NGT removal or interval pullback above the plane of the study. Electronically Signed   By: Almira Bar M.D.   On: 05/19/2023 07:13  DG Abd 1 View  Result Date: 05/18/2023 CLINICAL DATA:  Small bowel obstruction (HCC) EXAM: ABDOMEN - 1 VIEW COMPARISON:  October 14 24. FINDINGS: Gastric tube in the distal stomach or proximal duodenum. Bowel gas pattern is similar. No residual bowel gas pattern. Right upper quadrant clips. No obvious free air on the supine radiographs. No abnormal calcifications. Calcified phleboliths in the anatomic pelvis. IMPRESSION: Gastric tube in the distal stomach or proximal duodenum. Bowel gas pattern is similar. No residual oral contrast. Electronically Signed   By: Feliberto Harts M.D.   On: 05/18/2023 14:26   DG Abd Portable 1V-Small Bowel Obstruction Protocol-initial, 8 hr delay  Result Date: 05/17/2023 CLINICAL DATA:  8 hour  delay for small-bowel obstruction EXAM: PORTABLE ABDOMEN - 1 VIEW COMPARISON:  05/17/2023 at 9:34 a.m. FINDINGS: Images are obtained 8 hours after administration of contrast material via enteric tube. Contrast material is demonstrated in the descending colon and rectum suggesting no evidence of high-grade bowel obstruction. Visualized bowel gas pattern is normal. IMPRESSION: Contrast material is demonstrated in the descending colon and rectum suggesting no evidence of high-grade bowel obstruction. Electronically Signed   By: Burman Nieves M.D.   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 Patient with hx of multiple prior surgeries as stated above. He has a hx of Crohn's. He has been seen by GI and does not feel this is 2/2 active crohn's.  - No current indication for emergency surgery - Patient appears to be clinically improving. Contrast in colon on xray. NGT out, he is tolerating dys1 diet without n/v and having bowel function. ND/NT on exam. Will advance to soft diet. If he tolerates diet advancement he is okay for d/c from our standpoint. Discussed with RN and [facility].   FEN - Soft. IVF per [facility] VTE - SCDs, Lovenox ID - None  I reviewed nursing notes, last 24 h vitals and pain scores, last 48 h intake and output, last 24 h labs and trends, and last 24 h imaging results.   LOS: 2 days    Jacinto Halim , [facility] 05/19/2023, 9:42 AM Please see Amion for pager number during day hours 7:00am-4:30pm

## 2023-05-19 NOTE — Progress Notes (Signed)
Patient was given discharge instructions, and all questions were answered. Patient was stable for discharge and was walked to the main exit.

## 2023-05-24 DIAGNOSIS — K56609 Unspecified intestinal obstruction, unspecified as to partial versus complete obstruction: Secondary | ICD-10-CM | POA: Diagnosis not present

## 2023-05-24 DIAGNOSIS — Z4659 Encounter for fitting and adjustment of other gastrointestinal appliance and device: Secondary | ICD-10-CM | POA: Diagnosis not present

## 2023-05-24 DIAGNOSIS — K5180 Other ulcerative colitis without complications: Secondary | ICD-10-CM | POA: Diagnosis not present

## 2023-05-24 DIAGNOSIS — E663 Overweight: Secondary | ICD-10-CM | POA: Diagnosis not present

## 2023-05-24 DIAGNOSIS — Z6823 Body mass index (BMI) 23.0-23.9, adult: Secondary | ICD-10-CM | POA: Diagnosis not present

## 2023-06-08 ENCOUNTER — Ambulatory Visit: Payer: Medicare PPO | Attending: Cardiology | Admitting: Cardiology

## 2023-06-08 ENCOUNTER — Encounter: Payer: Self-pay | Admitting: Cardiology

## 2023-06-08 VITALS — BP 118/62 | HR 56 | Ht 70.0 in | Wt 198.0 lb

## 2023-06-08 DIAGNOSIS — Z09 Encounter for follow-up examination after completed treatment for conditions other than malignant neoplasm: Secondary | ICD-10-CM

## 2023-06-08 DIAGNOSIS — I7121 Aneurysm of the ascending aorta, without rupture: Secondary | ICD-10-CM

## 2023-06-08 DIAGNOSIS — I480 Paroxysmal atrial fibrillation: Secondary | ICD-10-CM | POA: Diagnosis not present

## 2023-06-08 DIAGNOSIS — I2510 Atherosclerotic heart disease of native coronary artery without angina pectoris: Secondary | ICD-10-CM | POA: Diagnosis not present

## 2023-06-08 DIAGNOSIS — I428 Other cardiomyopathies: Secondary | ICD-10-CM

## 2023-06-08 NOTE — Patient Instructions (Signed)
Medication Instructions:  Please discontinue your Amiodarone. Continue all other medications as listed.  *If you need a refill on your cardiac medications before your next appointment, please call your pharmacy*   Follow-Up: At [facility], you and your health needs are our priority.  As part of our continuing mission to provide you with exceptional heart care, we have created designated Provider Care Teams.  These Care Teams include your primary Cardiologist (physician) and Advanced Practice Providers (APPs -  Physician Assistants and Nurse Practitioners) who all work together to provide you with the care you need, when you need it.  We recommend signing up for the patient portal called "MyChart".  Sign up information is provided on this After Visit Summary.  MyChart is used to connect with patients for Virtual Visits (Telemedicine).  Patients are able to view lab/test results, encounter notes, upcoming appointments, etc.  Non-urgent messages can be sent to your provider as well.   To learn more about what you can do with MyChart, go to [email or web address].    Your next appointment:   6 month(s)  Provider:   Jari Favre, PA-C, Robin Searing, NP, Jacolyn Reedy, PA-C, Eligha Bridegroom, NP, [name], PA-C, or Perlie Gold, PA-C     Then, Donato Schultz, MD will plan to see you again in 1 year(s).

## 2023-06-08 NOTE — Progress Notes (Signed)
Cardiology Office Note:  .   Date:  06/08/2023  ID:  Derek [name], DOB [birth year], MRN [id number] PCP: Assunta Found, MD  [facility] Providers Cardiologist:  Donato Schultz, MD     History of Present Illness: .   Derek [name] is a 82 y.o. male Discussed with the use of AI scribe software   History of Present Illness   The patient, Derek [name], has a history of paroxysmal atrial fibrillation on amiodarone, ascending aortic aneurysm, non-ischemic cardiomyopathy normal ejection fraction, and chronic kidney disease.   He has been stable with no recent episodes of atrial fibrillation. His heart pump function, which was previously reduced, has improved to normal as of February 2021. He underwent a stress test in 2022, which showed no ischemia. He also has coronary artery calcification.  His creatinine level is 2.29, which increased due to dehydration during a recent hospitalization for a bowel obstruction.  He has been on amiodarone for atrial fibrillation, which has kept him in normal rhythm. His liver function was normal as of October 2024. He also has hypothyroidism, which is being managed with an increased dosage of medication.  In addition to these conditions, he has been diagnosed with alpha-gal syndrome. He has been experiencing some symptoms, but it is unclear whether these are related to the alpha-gal syndrome or his other conditions.  He was recently started on a blood pressure medication, hydrochlorothiazide, by [facility] due to high blood pressure. Since starting the medication, his blood pressure has been stable. He also reported that his potassium level was high at 5.8 during a recent check at the [place], and this is being monitored.  The patient has expressed a preference to reduce his medication load if possible.      Recent hospitalization with small bowel obstruction.    ROS: Denies chest pain, no shortness of breath  Studies Reviewed: Marland Kitchen   EKG  Interpretation Date/Time:  Tuesday June 08 2023 11:28:36 EST Ventricular Rate:  56 PR Interval:  184 QRS Duration:  114 QT Interval:  430 QTC Calculation: 414 R Axis:   -27  Text Interpretation: Sinus bradycardia Moderate voltage criteria for LVH, may be normal variant ( R in aVL , Cornell product ) When compared with ECG of 29-Mar-2021 00:59, No significant change since last tracing Confirmed by Donato Schultz ([id number]) on 06/08/2023 11:41:40 AM    Results LABS Creatinine: 2.29 (06/07/2023) Liver function tests: AST 28 U/L (05/2023) Potassium: 5.8 mmol/L (05/2023)  RADIOLOGY Aortic aneurysm measurement: 4.6 cm (12/2022) CT scan: Coronary artery calcification (2022)  DIAGNOSTIC EKG: Normal (06/07/2023) Echocardiogram: EF 60-65% (09/2019) Stress test: No ischemia (2022)  Risk Assessment/Calculations:            Physical Exam:   VS:  BP 118/62   Pulse (!) 56   Ht 5\' 10"  (1.778 m)   Wt 198 lb (89.8 kg)   SpO2 97%   BMI 28.41 kg/m    Wt Readings from Last 3 Encounters:  06/08/23 198 lb (89.8 kg)  05/16/23 189 lb 13.1 oz (86.1 kg)  05/08/23 192 lb 0.3 oz (87.1 kg)    GEN: Well nourished, well developed in no acute distress NECK: No JVD; No carotid bruits CARDIAC: RRR, no murmurs, no rubs, no gallops RESPIRATORY:  Clear to auscultation without rales, wheezing or rhonchi  ABDOMEN: Soft, non-tender, non-distended EXTREMITIES:  No edema; No deformity   ASSESSMENT AND PLAN: .    Assessment and Plan    Paroxysmal Atrial Fibrillation -Isolated  episode in 2017 prior to surgery.  EKG reviewed with heart rate of 130 bpm. Stable on Amiodarone with no recent episodes of AFib. EKG shows normal rhythm. Discussed potential side effects of Amiodarone including effects on thyroid and lungs. -Discontinue Amiodarone and monitor for recurrence of AFib.  Non-ischemic Cardiomyopathy Improved heart pump function with EF 60-65%. No recent signs of ischemia on stress test. -Continue  current management.  Aortic Aneurysm Stable at 4.6 cm since last check in May 2024. -Continue monitoring.  Dr. Dorris Fetch has been seeing.  Hypertension Blood pressure under control with Hydrochlorothiazide 25mg  daily. -Continue Hydrochlorothiazide 25mg  daily, started recently by the [place].  Chronic Kidney Disease Creatinine at 2.29, slightly elevated due to recent dehydration. Under management by nephrologist. -Continue current management and follow-up with nephrologist.  Hyperkalemia Recent potassium level at 5.8. -Repeat potassium level check at [facility].  Follow-up Plans -Schedule follow-up with APP in 6 months. -Schedule follow-up with me in 1 year.               Signed, Donato Schultz, MD

## 2023-06-10 ENCOUNTER — Other Ambulatory Visit: Payer: Self-pay

## 2023-06-10 ENCOUNTER — Encounter (HOSPITAL_COMMUNITY): Payer: Self-pay

## 2023-06-10 ENCOUNTER — Emergency Department (HOSPITAL_COMMUNITY)
Admission: EM | Admit: 2023-06-10 | Discharge: 2023-06-10 | Disposition: A | Discharge status: Home / Self Care | Payer: No Typology Code available for payment source | Attending: Emergency Medicine | Admitting: Emergency Medicine

## 2023-06-10 DIAGNOSIS — W268XXA Contact with other sharp object(s), not elsewhere classified, initial encounter: Secondary | ICD-10-CM | POA: Diagnosis not present

## 2023-06-10 DIAGNOSIS — S61211A Laceration without foreign body of left index finger without damage to nail, initial encounter: Secondary | ICD-10-CM | POA: Diagnosis present

## 2023-06-10 DIAGNOSIS — Z7901 Long term (current) use of anticoagulants: Secondary | ICD-10-CM | POA: Insufficient documentation

## 2023-06-10 MED ORDER — LIDOCAINE-EPINEPHRINE 2 %-1:100000 IJ SOLN
20.0000 mL | Freq: Once | INTRAMUSCULAR | Status: DC
  Filled 2023-06-10: qty 20

## 2023-06-10 MED ORDER — BACITRACIN ZINC 500 UNIT/GM EX OINT
TOPICAL_OINTMENT | Freq: Once | CUTANEOUS | Status: AC
  Administered 2023-06-10: 1 via TOPICAL

## 2023-06-10 MED ORDER — LIDOCAINE-EPINEPHRINE (PF) 2 %-1:200000 IJ SOLN
INTRAMUSCULAR | Status: AC
  Administered 2023-06-10: 20 mL
  Filled 2023-06-10: qty 20

## 2023-06-10 MED ORDER — BACITRACIN ZINC 500 UNIT/GM EX OINT
TOPICAL_OINTMENT | CUTANEOUS | Status: AC
  Filled 2023-06-10: qty 0.9

## 2023-06-10 NOTE — ED Triage Notes (Signed)
Pt has a laceration to his the first knuckle of left hand. Pt said he was using a hand saw and cut his hand by accident. Pt states his is on a blood thinner, Eliquis. Cleaned laceration and applied a dressing during triage.

## 2023-06-10 NOTE — Discharge Instructions (Signed)
Stitches can come out in 7 to 10 days at your doctor's office, this will likely have some bleeding for the next few days, do not be concerned by a small amount of bleeding.  You can take Tylenol or ibuprofen as needed for pain, please keep a sterile dressing on this at all times until the stitches come out, topical antibiotics twice a day  ER for worsening symptoms

## 2023-06-10 NOTE — ED Provider Notes (Signed)
[place] [facility] DEPARTMENT AT [facility] Provider Note   CSN: [id number] Arrival date & time: 06/10/23  0848     History  Chief Complaint  Patient presents with   Laceration    Derek [name] is a 82 y.o. male.   Laceration  This patient is a very pleasant 82 year old male on Eliquis for atrial fibrillation who presents after accidentally slicing his left index finger with a saw, this occurred approximately 30 minutes ago.  Bleeding was controlled prehospital.  He denies any other injuries    Home Medications Prior to Admission medications   Medication Sig Start Date End Date Taking? Authorizing Provider  calcium carbonate (TUMS - DOSED IN MG ELEMENTAL CALCIUM) 500 MG chewable tablet Chew 2 tablets by mouth as needed for indigestion or heartburn.    [provider]  Cholecalciferol (VITAMIN D) 50 MCG (2000 UT) CAPS Take 2,000 Units by mouth daily.    [provider]  cyanocobalamin 1000 MCG tablet Take 1 tablet (1,000 mcg total) by mouth daily. 05/09/23   Sheikh, Omair Latif, DO  ELIQUIS 5 MG TABS tablet Take 1 tablet (5 mg total) by mouth 2 (two) times daily. Patient taking differently: Take 2.5 mg by mouth in the morning and at bedtime. 11/05/16   End, Cristal Deer, MD  famotidine (PEPCID) 40 MG tablet Take 40 mg by mouth daily before breakfast.    [provider]  Febuxostat (ULORIC) 80 MG TABS Take 80 mg by mouth in the morning.    [provider]  fluticasone (FLONASE) 50 MCG/ACT nasal spray Place 2 sprays into both nostrils as needed for allergies or rhinitis.    [provider]  hydrALAZINE (APRESOLINE) 25 MG tablet Take 25 mg by mouth daily.    [provider]  levothyroxine (SYNTHROID) 25 MCG tablet Take 25 mcg by mouth daily before breakfast.    [provider]  Magnesium Oxide 420 MG TABS Take 1,260 mg by mouth in the morning and at bedtime.    [provider]  Multiple Vitamin  (MULTIVITAMIN WITH MINERALS) TABS tablet Take 1 tablet by mouth daily with breakfast.    [provider]  ondansetron (ZOFRAN) 4 MG tablet Take 4 mg by mouth as needed for nausea or vomiting.    Assunta Found, MD  pantoprazole (PROTONIX) 40 MG tablet Take 1 tablet (40 mg total) by mouth 2 (two) times daily for 30 days, THEN 1 tablet (40 mg total) daily. 05/09/23 07/08/23  Marguerita Merles Latif, DO  PARoxetine (PAXIL) 40 MG tablet Take 20 mg by mouth in the morning. 05/07/21   [provider]  rosuvastatin (CRESTOR) 10 MG tablet Take 1 tablet (10 mg total) by mouth daily. Patient taking differently: Take 5 mg by mouth daily. 10/17/20   Jake Bathe, MD  sodium bicarbonate 650 MG tablet Take 1,950 mg by mouth in the morning and at bedtime.    [provider]  sucralfate (CARAFATE) 1 GM/10ML suspension Take 1 g by mouth as needed (to coat the stomach).    [provider]  traZODone (DESYREL) 150 MG tablet Take 150 mg by mouth at bedtime. 05/07/21   [provider]  TYLENOL 500 MG tablet Take 1,000 mg by mouth as needed for mild pain or headache.    [provider]  Calcium Carbonate (CALCIUM 500 PO) Take 2 tablets by mouth 2 (two) times daily.   05/11/16  [provider]      Allergies  Lorazepam, Other, Humira [adalimumab], Colchicine, and Quinolones    Review of Systems   Review of Systems  All other systems reviewed and are negative.   Physical Exam Updated Vital Signs BP 118/66   Pulse 75   Temp 98.2 F (36.8 C)   Resp 18   Ht 1.778 m (5\' 10" )   Wt 89.8 kg   SpO2 98%   BMI 28.41 kg/m  Physical Exam Constitutional:      General: He is not in acute distress.    Appearance: Normal appearance. He is not ill-appearing.  HENT:     Head: Normocephalic and atraumatic.  Pulmonary:     Effort: Pulmonary effort is normal.  Musculoskeletal:        General: Signs of injury present.     Right lower leg: No edema.     Left lower  leg: No edema.  Skin:    Comments: + laceration, left index finger, proximal radial aspect of the digit distal to the MCP, flap type laceration approximately 5 cm in total length  Neurological:     General: No focal deficit present.     Mental Status: He is alert.     Comments: Will to fully extend and flex the index finger of the left hand, totally normal sensation distal to the injury on the radial proximal aspect of the left index finger just distal to the MCP joint.  Wound explored without evidence of exposed tendons or ligaments, he is able to fully abduct all fingers against resistance     ED Results / Procedures / Treatments   Labs (all labs ordered are listed, but only abnormal results are displayed) Labs Reviewed - No data to display  EKG None  Radiology No results found.  Procedures .Marland KitchenLaceration Repair  Date/Time: 06/10/2023 9:28 AM  Performed by: Eber Hong, MD Authorized by: Eber Hong, MD   Consent:    Consent obtained:  Verbal   Consent given by:  Patient   Risks, benefits, and alternatives were discussed: yes     Risks discussed:  Pain, retained foreign body, tendon damage, vascular damage, poor cosmetic result, poor wound healing, infection, nerve damage and need for additional repair   Alternatives discussed:  Delayed treatment Universal protocol:    Procedure explained and questions answered to patient or proxy's satisfaction: yes     Required blood products, implants, devices, and special equipment available: yes     Site/side marked: yes     Immediately prior to procedure, a time out was called: yes     Patient identity confirmed:  Verbally with patient Anesthesia:    Anesthesia method:  Local infiltration   Local anesthetic:  Lidocaine 1% WITH epi Laceration details:    Location:  Finger   Finger location:  L index finger   Length (cm):  5   Depth (mm):  3 Pre-procedure details:    Preparation:  Patient was prepped and draped in usual sterile  fashion Exploration:    Limited defect created (wound extended): no     Wound exploration: wound explored through full range of motion and entire depth of wound visualized     Wound extent: areolar tissue not violated, fascia not violated, no foreign body, no signs of injury, no nerve damage, no tendon damage, no underlying fracture and no vascular damage   Treatment:    Area cleansed with:  Povidone-iodine and saline   Amount of cleaning:  Extensive   Irrigation solution:  Sterile saline   Irrigation  volume:  1000   Debridement:  None   Undermining:  None Skin repair:    Repair method:  Sutures   Suture size:  5-0   Suture material:  Prolene   Suture technique:  Simple interrupted   Number of sutures:  5 Approximation:    Approximation:  Close Repair type:    Repair type:  Intermediate Post-procedure details:    Dressing:  Antibiotic ointment and sterile dressing   Procedure completion:  Tolerated Comments:           Medications Ordered in ED Medications  lidocaine-EPINEPHrine (XYLOCAINE W/EPI) 2 %-1:100000 (with pres) injection 20 mL (has no administration in time range)  lidocaine-EPINEPHrine (XYLOCAINE W/EPI) 2 %-1:200000 (PF) injection (has no administration in time range)    ED Course/ Medical Decision Making/ A&P                                 Medical Decision Making Risk Prescription drug management.   Wound, no need for imaging, will need to anesthetize explore irrigate and close, patient agreeable, vitals normal, no active bleeding, patient stable appearing        Final Clinical Impression(s) / ED Diagnoses Final diagnoses:  Laceration of left index finger without foreign body without damage to nail, initial encounter     Eber Hong, MD 06/10/23 [phone]

## 2023-06-16 DIAGNOSIS — E663 Overweight: Secondary | ICD-10-CM | POA: Diagnosis not present

## 2023-06-16 DIAGNOSIS — S61412A Laceration without foreign body of left hand, initial encounter: Secondary | ICD-10-CM | POA: Diagnosis not present

## 2023-06-16 DIAGNOSIS — Z6827 Body mass index (BMI) 27.0-27.9, adult: Secondary | ICD-10-CM | POA: Diagnosis not present

## 2023-06-22 ENCOUNTER — Ambulatory Visit
Admission: RE | Admit: 2023-06-22 | Discharge: 2023-06-22 | Disposition: A | Discharge status: Home / Self Care | Payer: Medicare PPO | Source: Ambulatory Visit | Attending: Thoracic Surgery (Cardiothoracic Vascular Surgery) | Admitting: Thoracic Surgery (Cardiothoracic Vascular Surgery)

## 2023-06-22 ENCOUNTER — Ambulatory Visit (INDEPENDENT_AMBULATORY_CARE_PROVIDER_SITE_OTHER): Payer: No Typology Code available for payment source | Admitting: Thoracic Surgery (Cardiothoracic Vascular Surgery)

## 2023-06-22 VITALS — BP 146/78 | HR 62 | Resp 18 | Ht 70.0 in | Wt 201.0 lb

## 2023-06-22 DIAGNOSIS — I7121 Aneurysm of the ascending aorta, without rupture: Secondary | ICD-10-CM | POA: Diagnosis not present
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
## 2023-07-07 DIAGNOSIS — Z20828 Contact with and (suspected) exposure to other viral communicable diseases: Secondary | ICD-10-CM | POA: Diagnosis not present

## 2023-07-07 DIAGNOSIS — R6889 Other general symptoms and signs: Secondary | ICD-10-CM | POA: Diagnosis not present

## 2023-07-07 DIAGNOSIS — E663 Overweight: Secondary | ICD-10-CM | POA: Diagnosis not present

## 2023-07-07 DIAGNOSIS — J069 Acute upper respiratory infection, unspecified: Secondary | ICD-10-CM | POA: Diagnosis not present

## 2023-07-07 DIAGNOSIS — Z6826 Body mass index (BMI) 26.0-26.9, adult: Secondary | ICD-10-CM | POA: Diagnosis not present

## 2023-07-08 ENCOUNTER — Ambulatory Visit: Payer: Medicare PPO | Admitting: Internal Medicine

## 2023-07-13 ENCOUNTER — Ambulatory Visit: Payer: Medicare PPO | Admitting: Physician Assistant

## 2023-07-13 ENCOUNTER — Other Ambulatory Visit (HOSPITAL_COMMUNITY): Payer: Self-pay | Admitting: Family Medicine

## 2023-07-13 ENCOUNTER — Ambulatory Visit (HOSPITAL_COMMUNITY)
Admission: RE | Admit: 2023-07-13 | Discharge: 2023-07-13 | Disposition: A | Discharge status: Home / Self Care | Payer: Medicare PPO | Source: Ambulatory Visit | Attending: Family Medicine | Admitting: Family Medicine

## 2023-07-13 DIAGNOSIS — N1832 Chronic kidney disease, stage 3b: Secondary | ICD-10-CM | POA: Diagnosis not present

## 2023-07-13 DIAGNOSIS — R296 Repeated falls: Secondary | ICD-10-CM

## 2023-07-13 DIAGNOSIS — E663 Overweight: Secondary | ICD-10-CM | POA: Diagnosis not present

## 2023-07-13 DIAGNOSIS — R4182 Altered mental status, unspecified: Secondary | ICD-10-CM | POA: Insufficient documentation

## 2023-07-13 DIAGNOSIS — R8290 Unspecified abnormal findings in urine: Secondary | ICD-10-CM | POA: Diagnosis not present

## 2023-07-13 DIAGNOSIS — N184 Chronic kidney disease, stage 4 (severe): Secondary | ICD-10-CM | POA: Diagnosis not present

## 2023-07-13 DIAGNOSIS — R410 Disorientation, unspecified: Secondary | ICD-10-CM | POA: Diagnosis not present

## 2023-07-13 DIAGNOSIS — Z6827 Body mass index (BMI) 27.0-27.9, adult: Secondary | ICD-10-CM | POA: Diagnosis not present

## 2023-07-16 ENCOUNTER — Other Ambulatory Visit (HOSPITAL_COMMUNITY): Payer: Self-pay | Admitting: Physician Assistant

## 2023-07-16 DIAGNOSIS — R109 Unspecified abdominal pain: Secondary | ICD-10-CM

## 2023-07-19 ENCOUNTER — Other Ambulatory Visit: Payer: Self-pay

## 2023-07-19 ENCOUNTER — Emergency Department (HOSPITAL_COMMUNITY): Payer: No Typology Code available for payment source

## 2023-07-19 ENCOUNTER — Encounter (HOSPITAL_COMMUNITY): Payer: Self-pay

## 2023-07-19 ENCOUNTER — Inpatient Hospital Stay (HOSPITAL_COMMUNITY)
Admission: EM | Admit: 2023-07-19 | Discharge: 2023-07-24 | DRG: 071 | Disposition: A | Discharge status: Home / Self Care | Payer: No Typology Code available for payment source | Attending: Internal Medicine | Admitting: Internal Medicine

## 2023-07-19 DIAGNOSIS — E039 Hypothyroidism, unspecified: Secondary | ICD-10-CM | POA: Diagnosis not present

## 2023-07-19 DIAGNOSIS — Z85828 Personal history of other malignant neoplasm of skin: Secondary | ICD-10-CM

## 2023-07-19 DIAGNOSIS — K219 Gastro-esophageal reflux disease without esophagitis: Secondary | ICD-10-CM | POA: Diagnosis present

## 2023-07-19 DIAGNOSIS — Z86718 Personal history of other venous thrombosis and embolism: Secondary | ICD-10-CM

## 2023-07-19 DIAGNOSIS — Z85038 Personal history of other malignant neoplasm of large intestine: Secondary | ICD-10-CM

## 2023-07-19 DIAGNOSIS — N200 Calculus of kidney: Secondary | ICD-10-CM | POA: Diagnosis present

## 2023-07-19 DIAGNOSIS — I480 Paroxysmal atrial fibrillation: Secondary | ICD-10-CM | POA: Diagnosis not present

## 2023-07-19 DIAGNOSIS — K5080 Crohn's disease of both small and large intestine without complications: Secondary | ICD-10-CM | POA: Diagnosis present

## 2023-07-19 DIAGNOSIS — Z7901 Long term (current) use of anticoagulants: Secondary | ICD-10-CM | POA: Diagnosis not present

## 2023-07-19 DIAGNOSIS — I428 Other cardiomyopathies: Secondary | ICD-10-CM | POA: Diagnosis not present

## 2023-07-19 DIAGNOSIS — Z79890 Hormone replacement therapy (postmenopausal): Secondary | ICD-10-CM

## 2023-07-19 DIAGNOSIS — R410 Disorientation, unspecified: Principal | ICD-10-CM

## 2023-07-19 DIAGNOSIS — I4891 Unspecified atrial fibrillation: Secondary | ICD-10-CM | POA: Diagnosis present

## 2023-07-19 DIAGNOSIS — N183 Chronic kidney disease, stage 3 unspecified: Secondary | ICD-10-CM | POA: Diagnosis present

## 2023-07-19 DIAGNOSIS — Z8249 Family history of ischemic heart disease and other diseases of the circulatory system: Secondary | ICD-10-CM

## 2023-07-19 DIAGNOSIS — N3000 Acute cystitis without hematuria: Secondary | ICD-10-CM | POA: Diagnosis not present

## 2023-07-19 DIAGNOSIS — N390 Urinary tract infection, site not specified: Secondary | ICD-10-CM | POA: Diagnosis not present

## 2023-07-19 DIAGNOSIS — N2581 Secondary hyperparathyroidism of renal origin: Secondary | ICD-10-CM | POA: Diagnosis present

## 2023-07-19 DIAGNOSIS — H9190 Unspecified hearing loss, unspecified ear: Secondary | ICD-10-CM | POA: Diagnosis present

## 2023-07-19 DIAGNOSIS — M109 Gout, unspecified: Secondary | ICD-10-CM | POA: Diagnosis present

## 2023-07-19 DIAGNOSIS — I7121 Aneurysm of the ascending aorta, without rupture: Secondary | ICD-10-CM | POA: Diagnosis present

## 2023-07-19 DIAGNOSIS — I2510 Atherosclerotic heart disease of native coronary artery without angina pectoris: Secondary | ICD-10-CM | POA: Diagnosis present

## 2023-07-19 DIAGNOSIS — Z841 Family history of disorders of kidney and ureter: Secondary | ICD-10-CM

## 2023-07-19 DIAGNOSIS — N1832 Chronic kidney disease, stage 3b: Secondary | ICD-10-CM | POA: Diagnosis not present

## 2023-07-19 DIAGNOSIS — F39 Unspecified mood [affective] disorder: Secondary | ICD-10-CM | POA: Diagnosis present

## 2023-07-19 DIAGNOSIS — G9341 Metabolic encephalopathy: Principal | ICD-10-CM | POA: Diagnosis present

## 2023-07-19 DIAGNOSIS — I129 Hypertensive chronic kidney disease with stage 1 through stage 4 chronic kidney disease, or unspecified chronic kidney disease: Secondary | ICD-10-CM | POA: Diagnosis present

## 2023-07-19 DIAGNOSIS — Z8616 Personal history of COVID-19: Secondary | ICD-10-CM | POA: Diagnosis not present

## 2023-07-19 DIAGNOSIS — G2581 Restless legs syndrome: Secondary | ICD-10-CM | POA: Diagnosis present

## 2023-07-19 DIAGNOSIS — N184 Chronic kidney disease, stage 4 (severe): Secondary | ICD-10-CM | POA: Diagnosis not present

## 2023-07-19 DIAGNOSIS — E538 Deficiency of other specified B group vitamins: Secondary | ICD-10-CM | POA: Diagnosis not present

## 2023-07-19 DIAGNOSIS — Z825 Family history of asthma and other chronic lower respiratory diseases: Secondary | ICD-10-CM | POA: Diagnosis not present

## 2023-07-19 DIAGNOSIS — Z96612 Presence of left artificial shoulder joint: Secondary | ICD-10-CM | POA: Diagnosis present

## 2023-07-19 DIAGNOSIS — Z888 Allergy status to other drugs, medicaments and biological substances status: Secondary | ICD-10-CM

## 2023-07-19 DIAGNOSIS — I351 Nonrheumatic aortic (valve) insufficiency: Secondary | ICD-10-CM

## 2023-07-19 DIAGNOSIS — G9340 Encephalopathy, unspecified: Principal | ICD-10-CM | POA: Diagnosis present

## 2023-07-19 DIAGNOSIS — F4310 Post-traumatic stress disorder, unspecified: Secondary | ICD-10-CM | POA: Diagnosis not present

## 2023-07-19 DIAGNOSIS — G629 Polyneuropathy, unspecified: Secondary | ICD-10-CM | POA: Diagnosis present

## 2023-07-19 DIAGNOSIS — M170 Bilateral primary osteoarthritis of knee: Secondary | ICD-10-CM | POA: Diagnosis present

## 2023-07-19 DIAGNOSIS — Z79899 Other long term (current) drug therapy: Secondary | ICD-10-CM

## 2023-07-19 DIAGNOSIS — Z9049 Acquired absence of other specified parts of digestive tract: Secondary | ICD-10-CM

## 2023-07-19 DIAGNOSIS — E872 Acidosis, unspecified: Secondary | ICD-10-CM | POA: Diagnosis not present

## 2023-07-19 DIAGNOSIS — R8279 Other abnormal findings on microbiological examination of urine: Secondary | ICD-10-CM

## 2023-07-19 LAB — CBC WITH DIFFERENTIAL/PLATELET
Abs Immature Granulocytes: 0.05 10*3/uL (ref 0.00–0.07)
Basophils Absolute: 0.1 10*3/uL (ref 0.0–0.1)
Basophils Relative: 1 %
Eosinophils Absolute: 0.1 10*3/uL (ref 0.0–0.5)
Eosinophils Relative: 2 %
HCT: 37 % — ABNORMAL LOW (ref 39.0–52.0)
Hemoglobin: 11.5 g/dL — ABNORMAL LOW (ref 13.0–17.0)
Immature Granulocytes: 1 %
Lymphocytes Relative: 14 %
Lymphs Abs: 1 10*3/uL (ref 0.7–4.0)
MCH: 28.5 pg (ref 26.0–34.0)
MCHC: 31.1 g/dL (ref 30.0–36.0)
MCV: 91.6 fL (ref 80.0–100.0)
Monocytes Absolute: 0.5 10*3/uL (ref 0.1–1.0)
Monocytes Relative: 6 %
Neutro Abs: 5.4 10*3/uL (ref 1.7–7.7)
Neutrophils Relative %: 76 %
Platelets: 142 10*3/uL — ABNORMAL LOW (ref 150–400)
RBC: 4.04 MIL/uL — ABNORMAL LOW (ref 4.22–5.81)
RDW: 15 % (ref 11.5–15.5)
WBC: 7.1 10*3/uL (ref 4.0–10.5)
nRBC: 0 % (ref 0.0–0.2)

## 2023-07-19 LAB — URINALYSIS, ROUTINE W REFLEX MICROSCOPIC
Bilirubin Urine: NEGATIVE
Glucose, UA: NEGATIVE mg/dL
Hgb urine dipstick: NEGATIVE
Ketones, ur: NEGATIVE mg/dL
Leukocytes,Ua: NEGATIVE
Nitrite: NEGATIVE
Protein, ur: 30 mg/dL — AB
Specific Gravity, Urine: 1.026 (ref 1.005–1.030)
pH: 5 (ref 5.0–8.0)

## 2023-07-19 LAB — COMPREHENSIVE METABOLIC PANEL
ALT: 16 U/L (ref 0–44)
AST: 28 U/L (ref 15–41)
Albumin: 3.9 g/dL (ref 3.5–5.0)
Alkaline Phosphatase: 129 U/L — ABNORMAL HIGH (ref 38–126)
Anion gap: 11 (ref 5–15)
BUN: 20 mg/dL (ref 8–23)
CO2: 19 mmol/L — ABNORMAL LOW (ref 22–32)
Calcium: 8.8 mg/dL — ABNORMAL LOW (ref 8.9–10.3)
Chloride: 109 mmol/L (ref 98–111)
Creatinine, Ser: 2.31 mg/dL — ABNORMAL HIGH (ref 0.61–1.24)
GFR, Estimated: 28 mL/min — ABNORMAL LOW (ref >60)
Glucose, Bld: 99 mg/dL (ref 70–99)
Potassium: 4.6 mmol/L (ref 3.5–5.1)
Sodium: 139 mmol/L (ref 135–145)
Total Bilirubin: 0.6 mg/dL (ref <1.2)
Total Protein: 6.7 g/dL (ref 6.5–8.1)

## 2023-07-19 MED ORDER — ONDANSETRON HCL 4 MG/2ML IJ SOLN
4.0000 mg | Freq: Four times a day (QID) | INTRAMUSCULAR | Status: DC | PRN
Start: 2023-07-19 — End: 2023-07-24
  Administered 2023-07-20 – 2023-07-22 (×3): 4 mg via INTRAVENOUS
  Filled 2023-07-19 (×3): qty 2

## 2023-07-19 MED ORDER — ONDANSETRON HCL 4 MG/2ML IJ SOLN
4.0000 mg | Freq: Once | INTRAMUSCULAR | Status: AC
Start: 2023-07-19 — End: 2023-07-19
  Administered 2023-07-19: 4 mg via INTRAVENOUS
  Filled 2023-07-19: qty 2

## 2023-07-19 MED ORDER — ACETAMINOPHEN 650 MG RE SUPP: 650.0000 mg | Freq: Four times a day (QID) | RECTAL | Status: DC | PRN

## 2023-07-19 MED ORDER — SORBITOL 70 % SOLN: 30.0000 mL | Freq: Every day | Status: DC | PRN

## 2023-07-19 MED ORDER — KCL-LACTATED RINGERS-D5W 20 MEQ/L IV SOLN
INTRAVENOUS | Status: AC
  Filled 2023-07-19 (×4): qty 1000

## 2023-07-19 MED ORDER — ONDANSETRON HCL 4 MG PO TABS: 4.0000 mg | ORAL_TABLET | Freq: Four times a day (QID) | ORAL | Status: DC | PRN

## 2023-07-19 MED ORDER — APIXABAN 2.5 MG PO TABS
2.5000 mg | ORAL_TABLET | Freq: Two times a day (BID) | ORAL | Status: DC
  Administered 2023-07-20 – 2023-07-24 (×9): 2.5 mg via ORAL
  Filled 2023-07-19 (×10): qty 1

## 2023-07-19 MED ORDER — ACETAMINOPHEN 325 MG PO TABS
650.0000 mg | ORAL_TABLET | Freq: Four times a day (QID) | ORAL | Status: DC | PRN
  Administered 2023-07-23 – 2023-07-24 (×3): 650 mg via ORAL
  Filled 2023-07-19 (×4): qty 2

## 2023-07-19 MED ORDER — SODIUM CHLORIDE 0.9 % IV SOLN
2.0000 g | INTRAVENOUS | Status: AC
  Administered 2023-07-19: 2 g via INTRAVENOUS
  Filled 2023-07-19: qty 12.5

## 2023-07-19 MED ORDER — MORPHINE SULFATE (PF) 4 MG/ML IV SOLN
4.0000 mg | Freq: Once | INTRAVENOUS | Status: AC
  Administered 2023-07-19: 4 mg via INTRAVENOUS
  Filled 2023-07-19: qty 1

## 2023-07-19 NOTE — ED Triage Notes (Signed)
Pt arrives via POV. Pt reports he was seen at the [place] on Friday at an UC. Pt received a call from the [place] reports that his urine culture came back positive. Pt denies urinary symptoms, reports lower back pain for months. Pt AxOx4.

## 2023-07-19 NOTE — ED Provider Triage Note (Addendum)
Emergency Medicine Provider Triage Evaluation Note  Derek [name] , a 82 y.o. male  was evaluated in triage.  Pt complains of abnormal lab. States that he has been having right flank pain for 5-6 weeks. Wife states he has been more confused in the past week. Went to his doctor at the [place] and had CT that showed kidney stones. Received a phone call this morning to come here as his urine culture grew Morganella and Pseudomonas. They were reportedly concerned for septic kidney stone. Patient is alert and oriented, denies fevers, chills, nausea, vomiting, diarrhea, or urinary symptoms.  Review of Systems  Positive:  Negative:   Physical Exam  BP 113/60   Pulse 83   Temp 97.6 F (36.4 C) (Oral)   Resp 17   Ht 5\' 11"  (1.803 m)   Wt 86.6 kg   SpO2 97%   BMI 26.64 kg/m  Gen:   Awake, no distress   Resp:  Normal effort  MSK:   Moves extremities without difficulty  Other:  Alert and oriented and neurologically intact without focal deficits.  Medical Decision Making  Medically screening exam initiated at 1:22 PM.  Appropriate orders placed.  Derek [name] was informed that the remainder of the evaluation will be completed by another provider, this initial triage assessment does not replace that evaluation, and the importance of remaining in the ED until their evaluation is complete.  Unable to review notes from patients pcp visit given it was at the [place]. Work-up initiated   Patient states that family is getting his images transferred over here from the [place]   Derek [name] 07/19/23 1325    SmootShawn Route, PA-C 07/19/23 1401

## 2023-07-19 NOTE — ED Notes (Addendum)
Pt reported to staff that he is having a burning sensation in his chest. EKG ordered by this RN, patient refused. Paschal Dopp, NT witnessed patient refusal.

## 2023-07-19 NOTE — ED Notes (Signed)
Pt ambulated to bathroom without incident.
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
## 2023-07-19 NOTE — ED Provider Notes (Signed)
[place] EMERGENCY DEPARTMENT AT [facility] Provider Note  CSN: [id number] Arrival date & time: 07/19/23 1228  Chief Complaint(s) Abnormal Lab (Urine culture abdnormal/)  HPI GALAN MANNOR is a 82 y.o. male with PMH ascending thoracic aortic aneurysm, A-fib, history of Crohn's status post bowel perforation and multiple abdominal surgeries, GERD who presents emergency room and for evaluation of confusion, flank pain and abnormal labs.  Patient states that for the last 1 week he has been progressively more confused.  Also with worsening right flank pain.  Was seen at urgent care at the [facility] where he receives a urinalysis with urine culture growing Morganella morganii and Pseudomonas resistant to multiple oral medications.  Patient instructed to go to the emergency department for further evaluation.  Denies chest pain, shortness of breath, headache, fever or other systemic symptoms.   Past Medical History Past Medical History:  Diagnosis Date   Anemia    Anxiety    Aortic insufficiency    a. mild-mod by echo 09/2015.   Arthritis    "knees; left shoulder" (09/21/2013) oa   Ascending aortic aneurysm (HCC)    stable less than 5 cm per 11-21-2020 chest ct epic   Atrial fibrillation (HCC)    B12 deficiency    takes Vit 12 shot every 14days    Bowel perforation (HCC) 08/27/2016   CKD (chronic kidney disease) stage 3 B, GFR 30-59 ml/min (HCC) 08/22/2011   dr patel Theron Arista 08-26-2021   Colon cancer [facility]) 2018   surgery   Colonic ischemia (HCC) 08/27/2016   Coronary artery disease    COVID-19 03/29/2021   hospitalized from 03-29-2021 to 03-31-2021   Crohn's disease (HCC)    Crohn's ileocolitis (HCC) 06/26/2009   1980 - inflammatory mass - terminal ileal (R hemicolectomy) resection  Reaction to 6 MP 2006  ? Pancreatitis from Humira  Recurrent SBO's  Has had ileocolonic stricture dilated to 18 mm last 2013        Enteric hyperoxaluria 02/21/2016   GERD (gastroesophageal reflux  disease)    GI bleed    Gout    takes Uloric  daily   Gout 05/09/2008   Qualifier: Diagnosis of   By: Nelson-Smith CMA (AAMA), Dottie         Gout flare 09/02/2021   right heel decadron 10 mg im given   Hearing loss    Heart murmur    Hematuria    few weeks ago per pt wife pam on 09-03-2021   Hepatitis C 1978   negtive RNA load - spontaneously cleared   Hiatal hernia    High output ileostomy (HCC) 06/02/2017   History of blood transfusion 1978; 1990's; ?   "w/bowel resection; S/P allupurinol; ?" (09/21/2013)   History of colon polyps    History of dvt 2006   late 1980's earlt 1990's from jumping from plane left leg   History of kidney stones    1980's   History of MRSA infection 2010   History of small bowel obstruction    History of staph infection 1978   Hyperoxaluria    Intestinal   Hypertension    Hypothyroidism    Insomnia    takes Trazodone nightly   Internal hemorrhoids    LV dysfunction    a. h/o EF 45-50% in 2015, normalized on subsequent echoes.   Nephrolithiasis    Nocardia infection 09/2016   resolved   Pancreatitis 2010   elevated lipase and amylase, stranding in tail of pancreas, ? from  Humira   Pancytopenia    Hx of   Peripheral neuropathy    takes Gabapentin daily in both legs   Pneumonia    several times last times 2012   POST TRAUMATIC STRESS SYNDROME 05/09/2008   Qualifier: Diagnosis of   By: Candice Camp CMA (AAMA), Dottie         PTSD (post-traumatic stress disorder)    takes paxil for   Pulmonary nodule    a. 6mm by CT 05/2015, recommended f/u 6-12 months.   Renal tubular acidosis    RLS (restless legs syndrome)    Rosacea conjunctivitis(372.31)    Secondary hyperparathyroidism (HCC) 02/21/2016   Shingles 2022   head close to eye   Short bowel syndrome    Sinus bradycardia 2018   none since   Skin cancer    "cut/burned off left ear and face" (09/21/2013)   Sleep apnea    Small bowel obstruction (HCC) 1978   Status post reversal of  ileostomy 07/23/2017   Thrombocytopenia (HCC)    problems since 1978   Wears glasses reading    Patient Active Problem List   Diagnosis Date Noted   Benign prostatic hyperplasia 05/17/2023   PTSD (post-traumatic stress disorder) 05/17/2023   Rosacea 05/17/2023   Exposure to potentially hazardous substance 05/17/2023   Hallux rigidus, left foot 05/17/2023   Perioral dermatitis 05/17/2023   Presbyopia 05/17/2023   Restrictive lung disease 05/17/2023   Other interstitial pulmonary diseases with fibrosis in diseases classified elsewhere (HCC) 05/17/2023   Sleep apnea, unspecified 05/17/2023   Snoring 05/17/2023   Pulmonary fibrosis, unspecified (HCC) 05/17/2023   Gastropathy 05/17/2023   History of colon cancer 05/17/2023   History of Crohn's disease 05/17/2023   Small bowel obstruction (HCC) 05/17/2023   SBO (small bowel obstruction) (HCC) 05/16/2023   Duodenal ulcer 05/08/2023   Gastrointestinal hemorrhage with melena 05/06/2023   Ground glass opacity present on imaging of lung 11/26/2021   Diarrhea 11/26/2021   Draining cutaneous sinus tract 10/29/2021   Hemorrhage of gastrointestinal tract 05/28/2021   Hallux rigidus, right foot 05/28/2021   Hepatitis C, chronic (HCC) 05/28/2021   Insomnia 05/28/2021   Ocular rosacea 05/28/2021   Partial loss of teeth due to periodontal diseases, class IV 05/28/2021   Periapical abscess without sinus tract 05/28/2021   Pernicious anemia 05/28/2021   Pleurisy 05/28/2021   Pure hypertriglyceridemia 05/28/2021   Weakness 05/28/2021   Chronic atrial fibrillation, unspecified (HCC) 05/28/2021   COVID-19 virus infection 03/29/2021   Hypocalcemia 03/29/2021   Atrial fibrillation, chronic (HCC) 03/29/2021   Hyperlipidemia 03/29/2021   Hypothyroidism 03/29/2021   Personal history of pT2pN0 transverse colon cancer s/p colectomy Jan 2018 08/14/2019   Elevated LFTs 03/12/2019   Osteoarthritis of left knee 08/23/2018   History of gout 08/23/2018    Thoracic aortic aneurysm without rupture (HCC) 10/26/2017   Aortic valve regurgitation 11/05/2016   Chronic anticoagulation 08/18/2016   Sinus bradycardia 08/17/2016   S/P colon resection 08/14/2016   Essential hypertension 05/27/2016   Peripheral neuropathic pain 05/26/2016   GERD (gastroesophageal reflux disease) 05/26/2016   Paroxysmal atrial fibrillation (HCC) 05/26/2016   Lung nodule 05/12/2016   Enteric hyperoxaluria 02/21/2016   Secondary hyperparathyroidism (HCC) 02/21/2016   Pain in joint, shoulder region 10/12/2013   Decreased range of motion of left shoulder 10/12/2013   Muscle weakness (generalized) 10/12/2013   Restriction of joint motion 10/12/2013   Nonischemic cardiomyopathy (HCC) 09/07/2013   Anxiety 08/22/2011   CKD (chronic kidney disease) stage 4, GFR 15-29  ml/min (HCC) 08/22/2011   Thrombocytopenia (HCC) 08/22/2011   CORONARY ATHEROSCLEROSIS NATIVE CORONARY ARTERY 06/25/2010   Crohn's ileocolitis (HCC) 06/26/2009   B12 DEFICIENCY 05/09/2008   Gout 05/09/2008   POST TRAUMATIC STRESS SYNDROME 05/09/2008   GERD 05/09/2008   HEPATITIS C Ab positive RNA neg, HX OF 05/09/2008   PULMONARY EMBOLISM, HX OF 05/09/2008   History of small bowel obstruction 05/09/2008   Deficiency of other specified B group vitamins 05/09/2008   Home Medication(s) Prior to Admission medications   Medication Sig Start Date End Date Taking? Authorizing Provider  calcium carbonate (TUMS - DOSED IN MG ELEMENTAL CALCIUM) 500 MG chewable tablet Chew 2 tablets by mouth as needed for indigestion or heartburn.    [provider]  Cholecalciferol (VITAMIN D) 50 MCG (2000 UT) CAPS Take 2,000 Units by mouth daily.    [provider]  cyanocobalamin 1000 MCG tablet Take 1 tablet (1,000 mcg total) by mouth daily. 05/09/23   Sheikh, Omair Latif, DO  ELIQUIS 5 MG TABS tablet Take 1 tablet (5 mg total) by mouth 2 (two) times daily. Patient taking differently: Take 2.5 mg by mouth in  the morning and at bedtime. 11/05/16   End, Cristal Deer, MD  famotidine (PEPCID) 40 MG tablet Take 40 mg by mouth daily before breakfast.    [provider]  Febuxostat (ULORIC) 80 MG TABS Take 80 mg by mouth in the morning.    [provider]  fluticasone (FLONASE) 50 MCG/ACT nasal spray Place 2 sprays into both nostrils as needed for allergies or rhinitis.    [provider]  hydrALAZINE (APRESOLINE) 25 MG tablet Take 25 mg by mouth daily.    [provider]  levothyroxine (SYNTHROID) 25 MCG tablet Take 50 mcg by mouth daily before breakfast.    [provider]  Magnesium Oxide 420 MG TABS Take 1,260 mg by mouth in the morning and at bedtime.    [provider]  Multiple Vitamin (MULTIVITAMIN WITH MINERALS) TABS tablet Take 1 tablet by mouth daily with breakfast.    [provider]  ondansetron (ZOFRAN) 4 MG tablet Take 4 mg by mouth as needed for nausea or vomiting.    Assunta Found, MD  pantoprazole (PROTONIX) 40 MG tablet Take 1 tablet (40 mg total) by mouth 2 (two) times daily for 30 days, THEN 1 tablet (40 mg total) daily. 05/09/23 07/08/23  Marguerita Merles Latif, DO  PARoxetine (PAXIL) 40 MG tablet Take 20 mg by mouth in the morning. 05/07/21   [provider]  rosuvastatin (CRESTOR) 10 MG tablet Take 1 tablet (10 mg total) by mouth daily. Patient taking differently: Take 5 mg by mouth daily. 10/17/20   Jake Bathe, MD  sodium bicarbonate 650 MG tablet Take 1,950 mg by mouth in the morning and at bedtime.    [provider]  sucralfate (CARAFATE) 1 GM/10ML suspension Take 1 g by mouth as needed (to coat the stomach).    [provider]  traZODone (DESYREL) 150 MG tablet Take 150 mg by mouth at bedtime. 05/07/21   [provider]  TYLENOL 500 MG tablet Take 1,000 mg by mouth as needed for mild pain or headache.    [provider]  Calcium Carbonate (CALCIUM 500 PO) Take 2 tablets by mouth 2  (two) times daily.   05/11/16  [provider]  Past Surgical History Past Surgical History:  Procedure Laterality Date   ANKLE SURGERY Right    APPENDECTOMY  1978   BIOPSY  05/08/2023   Procedure: BIOPSY;  Surgeon: Hilarie Fredrickson, MD;  Location: [facility];  Service: Gastroenterology;;   BOWEL RESECTION  1978 X 2   CARDIOVERSION N/A 05/29/2016   Procedure: CARDIOVERSION;  Surgeon: Thurmon Fair, MD;  Location: [facility];  Service: Cardiovascular;  Laterality: N/A;   CHOLECYSTECTOMY     yrs ago   COLON RESECTION N/A 08/14/2016   Procedure: LAPAROSCOPIC RESECTION TRANSVERSE COLON;  Surgeon: Ovidio Kin, MD;  Location: [facility];  Service: General;  Laterality: N/A;   COLON SURGERY  2018   ileostomy   COLONOSCOPY     ESOPHAGOGASTRODUODENOSCOPY     ESOPHAGOGASTRODUODENOSCOPY (EGD) WITH PROPOFOL N/A 05/08/2023   Procedure: ESOPHAGOGASTRODUODENOSCOPY (EGD) WITH PROPOFOL;  Surgeon: Hilarie Fredrickson, MD;  Location: [facility];  Service: Gastroenterology;  Laterality: N/A;   EXTRACORPOREAL SHOCK WAVE LITHOTRIPSY     yrs ago   EYE SURGERY     cataract surgery bilateral   FOOT SURGERY Right    "took gout out"   HEMICOLECTOMY Right    ILEOCECETOMY  1978   /notes 05/10/2000  (09/21/2013)   ILEOSTOMY     ILEOSTOMY CLOSURE N/A 07/23/2017   Procedure: ILEOSTOMY REVERSAL ;  Surgeon: Ovidio Kin, MD;  Location: [facility];  Service: General;  Laterality: N/A;   INGUINAL HERNIA REPAIR Right    IR FLUORO GUIDE CV LINE RIGHT  05/07/2017   IR REMOVAL TUN CV CATH W/O FL  10/08/2017   IR US GUIDE VASC ACCESS RIGHT  05/07/2017   KNEE ARTHROSCOPY Left    LAPAROTOMY N/A 08/27/2016   Procedure: EXPLORATORYLAPAROTOMY, LYSIS OF ADHESIONS, ILEOSTOMY, RIGHT COLECTOMY;  Surgeon: Ovidio Kin, MD;  Location: [facility];  Service: General;  Laterality: N/A;   LIGAMENT REPAIR Left     POLYPECTOMY     right big toe surgery  06/2021   in [place]   TEE WITHOUT CARDIOVERSION N/A 05/29/2016   Procedure: TRANSESOPHAGEAL ECHOCARDIOGRAM (TEE);  Surgeon: Thurmon Fair, MD;  Location: [facility];  Service: Cardiovascular;  Laterality: N/A;   TOTAL SHOULDER ARTHROPLASTY Left 09/21/2013   Procedure: LEFT TOTAL SHOULDER ARTHROPLASTY;  Surgeon: Senaida Lange, MD;  Location: [facility];  Service: Orthopedics;  Laterality: Left;   WOUND DEBRIDEMENT N/A 09/05/2021   Procedure: CHRONIC ABDOMINAL WOUND EXPLORATION, REMOVAL OF PROLENE STITCH ABCESS;  Surgeon: Stechschulte, Hyman Hopes, MD;  Location: [facility];  Service: General;  Laterality: N/A;   Family History Family History  Problem Relation Age of Onset   Aneurysm Mother    Emphysema Mother        smoked   Kidney disease Father    Hypertension Father    Aneurysm Sister    Esophageal cancer Neg Hx    Stomach cancer Neg Hx    Rectal cancer Neg Hx    Colon cancer Neg Hx     Social History Social History   Tobacco Use   Smoking status: Former    Current packs/day: 0.00    Average packs/day: 2.0 packs/day for 20.0 years (40.0 ttl pk-yrs)    Types: Cigarettes    Start date: 08/04/1955    Quit date: 08/04/1975    Years since quitting: 47.9   Smokeless tobacco: Former    Types: Chew    Quit date: 08/03/1978   Tobacco comments:    09/21/2013 "quit smoking in the late 1970's; stopped chewing  couple years after I quit smoking"  Vaping Use   Vaping status: Never Used  Substance Use Topics   Alcohol use: No   Drug use: No   Allergies Lorazepam, Other, Humira [adalimumab], Colchicine, and Quinolones  Review of Systems Review of Systems  Genitourinary:  Positive for flank pain.  Psychiatric/Behavioral:  Positive for confusion.     Physical Exam Vital Signs  I have reviewed the triage vital signs BP (!) 142/69   Pulse 63   Temp 97.7 F (36.5 C) (Oral)   Resp 12   Ht 5\' 11"  (1.803 m)   Wt 86.6 kg   SpO2 100%    BMI 26.64 kg/m   Physical Exam Constitutional:      General: He is not in acute distress.    Appearance: Normal appearance.  HENT:     Head: Normocephalic and atraumatic.     Nose: No congestion or rhinorrhea.  Eyes:     General:        Right eye: No discharge.        Left eye: No discharge.     Extraocular Movements: Extraocular movements intact.     Pupils: Pupils are equal, round, and reactive to light.  Cardiovascular:     Rate and Rhythm: Normal rate and regular rhythm.     Heart sounds: No murmur heard. Pulmonary:     Effort: No respiratory distress.     Breath sounds: No wheezing or rales.  Abdominal:     General: There is no distension.     Tenderness: There is no abdominal tenderness. There is right CVA tenderness.  Musculoskeletal:        General: Normal range of motion.     Cervical back: Normal range of motion.  Skin:    General: Skin is warm and dry.  Neurological:     General: No focal deficit present.     Mental Status: He is alert.     ED Results and Treatments Labs (all labs ordered are listed, but only abnormal results are displayed) Labs Reviewed  COMPREHENSIVE METABOLIC PANEL - Abnormal; Notable for the following components:      Result Value   CO2 19 (*)    Creatinine, Ser 2.31 (*)    Calcium 8.8 (*)    Alkaline Phosphatase 129 (*)    GFR, Estimated 28 (*)    All other components within normal limits  URINALYSIS, ROUTINE W REFLEX MICROSCOPIC - Abnormal; Notable for the following components:   Color, Urine AMBER (*)    Protein, ur 30 (*)    Bacteria, UA RARE (*)    All other components within normal limits  CBC WITH DIFFERENTIAL/PLATELET - Abnormal; Notable for the following components:   RBC 4.04 (*)    Hemoglobin 11.5 (*)    HCT 37.0 (*)    Platelets 142 (*)    All other components within normal limits  URINE CULTURE  CBC WITH DIFFERENTIAL/PLATELET  Radiology No results found.  Pertinent labs & imaging results that were available during my care of the patient were reviewed by me and considered in my medical decision making (see MDM for details).  Medications Ordered in ED Medications  ceFEPIme (MAXIPIME) 2 g in sodium chloride 0.9 % 100 mL IVPB (2 g Intravenous New Bag/Given 07/19/23 2148)  morphine (PF) 4 MG/ML injection 4 mg (4 mg Intravenous Given 07/19/23 2145)  ondansetron (ZOFRAN) injection 4 mg (4 mg Intravenous Given 07/19/23 2145)                                                                                                                                     Procedures Procedures  (including critical care time)  Medical Decision Making / ED Course   This patient presents to the ED for concern of altered mental status, positive urine cultures, this involves an extensive number of treatment options, and is a complaint that carries with it a high risk of complications and morbidity.  The differential diagnosis includes UTI, pyelonephritis, nephrolithiasis, metabolic encephalopathy, toxic encephalopathy  MDM: Patient seen emergency room for evaluation of flank pain and confusion with positive urine cultures.  Physical exam with right CVA tenderness but is otherwise unremarkable.  Neurologic exam unremarkable with no focal motor or sensory deficits.  Laboratory evaluation is overall reassuring with no segment leukocytosis, hemoglobin 11.5, CO2 19, creatinine 2.31 which is an elevation for this patient, urinalysis without positive nitrites or leuk esterase but there are white blood cell casts.  Stone study largely unremarkable.  Based on culture data, patient started on cefepime and given altered mental status in the setting of positive urine cultures patient require hospital admission.  Patient admitted   Additional history obtained: -Additional history obtained from wife -External records  from outside source obtained and reviewed including: Chart review including previous notes, labs, imaging, consultation notes   Lab Tests: -I ordered, reviewed, and interpreted labs.   The pertinent results include:   Labs Reviewed  COMPREHENSIVE METABOLIC PANEL - Abnormal; Notable for the following components:      Result Value   CO2 19 (*)    Creatinine, Ser 2.31 (*)    Calcium 8.8 (*)    Alkaline Phosphatase 129 (*)    GFR, Estimated 28 (*)    All other components within normal limits  URINALYSIS, ROUTINE W REFLEX MICROSCOPIC - Abnormal; Notable for the following components:   Color, Urine AMBER (*)    Protein, ur 30 (*)    Bacteria, UA RARE (*)    All other components within normal limits  CBC WITH DIFFERENTIAL/PLATELET - Abnormal; Notable for the following components:   RBC 4.04 (*)    Hemoglobin 11.5 (*)    HCT 37.0 (*)    Platelets 142 (*)    All other components within normal limits  URINE CULTURE  CBC WITH DIFFERENTIAL/PLATELET      EKG  EKG Interpretation Date/Time:  Monday July 19 2023 21:11:16 EST Ventricular Rate:  63 PR Interval:  184 QRS Duration:  110 QT Interval:  467 QTC Calculation: 479 R Axis:   -35  Text Interpretation: Sinus rhythm Incomplete left bundle branch block Confirmed by Jarmarcus Wambold ([id number]) on 07/19/2023 10:09:33 PM         Imaging Studies ordered: I ordered imaging studies including CT stone study I independently visualized and interpreted imaging. I agree with the radiologist interpretation   Medicines ordered and prescription drug management: Meds ordered this encounter  Medications   morphine (PF) 4 MG/ML injection 4 mg   ondansetron (ZOFRAN) injection 4 mg   ceFEPIme (MAXIPIME) 2 g in sodium chloride 0.9 % 100 mL IVPB    Antibiotic Indication::   UTI    -I have reviewed the patients home medicines and have made adjustments as needed  Critical interventions none    Cardiac Monitoring: The patient was  maintained on a cardiac monitor.  I personally viewed and interpreted the cardiac monitored which showed an underlying rhythm of: NSR  Social Determinants of Health:  Factors impacting patients care include: none   Reevaluation: After the interventions noted above, I reevaluated the patient and found that they have :stayed the same  Co morbidities that complicate the patient evaluation  Past Medical History:  Diagnosis Date   Anemia    Anxiety    Aortic insufficiency    a. mild-mod by echo 09/2015.   Arthritis    "knees; left shoulder" (09/21/2013) oa   Ascending aortic aneurysm (HCC)    stable less than 5 cm per 11-21-2020 chest ct epic   Atrial fibrillation (HCC)    B12 deficiency    takes Vit 12 shot every 14days    Bowel perforation (HCC) 08/27/2016   CKD (chronic kidney disease) stage 3 B, GFR 30-59 ml/min (HCC) 08/22/2011   dr patel Theron Arista 08-26-2021   Col[facility]) 2018   surgery   Colonic ischemia (HCC) 08/27/2016   Coronary artery disease    COVID-19 03/29/2021   hospitalized from 03-29-2021 to 03-31-2021   Crohn's disease (HCC)    Crohn's ileocolitis (HCC) 06/26/2009   1980 - inflammatory mass - terminal ileal (R hemicolectomy) resection  Reaction to 6 MP 2006  ? Pancreatitis from Humira  Recurrent SBO's  Has had ileocolonic stricture dilated to 18 mm last 2013        Enteric hyperoxaluria 02/21/2016   GERD (gastroesophageal reflux disease)    GI bleed    Gout    takes Uloric  daily   Gout 05/09/2008   Qualifier: Diagnosis of   By: Nelson-Smith CMA (AAMA), Dottie         Gout flare 09/02/2021   right heel decadron 10 mg im given   Hearing loss    Heart murmur    Hematuria    few weeks ago per pt wife pam on 09-03-2021   Hepatitis C 1978   negtive RNA load - spontaneously cleared   Hiatal hernia    High output ileostomy (HCC) 06/02/2017   History of blood transfusion 1978; 1990's; ?   "w/bowel resection; S/P allupurinol; ?" (09/21/2013)   History of colon  polyps    History of dvt 2006   late 1980's earlt 1990's from jumping from plane left leg   History of kidney stones    1980's   History of MRSA infection 2010   History of small bowel obstruction    History of  staph infection 1978   Hyperoxaluria    Intestinal   Hypertension    Hypothyroidism    Insomnia    takes Trazodone nightly   Internal hemorrhoids    LV dysfunction    a. h/o EF 45-50% in 2015, normalized on subsequent echoes.   Nephrolithiasis    Nocardia infection 09/2016   resolved   Pancreatitis 2010   elevated lipase and amylase, stranding in tail of pancreas, ? from Humira   Pancytopenia    Hx of   Peripheral neuropathy    takes Gabapentin daily in both legs   Pneumonia    several times last times 2012   POST TRAUMATIC STRESS SYNDROME 05/09/2008   Qualifier: Diagnosis of   By: Candice Camp CMA (AAMA), Dottie         PTSD (post-traumatic stress disorder)    takes paxil for   Pulmonary nodule    a. 6mm by CT 05/2015, recommended f/u 6-12 months.   Renal tubular acidosis    RLS (restless legs syndrome)    Rosacea conjunctivitis(372.31)    Secondary hyperparathyroidism (HCC) 02/21/2016   Shingles 2022   head close to eye   Short bowel syndrome    Sinus bradycardia 2018   none since   Skin cancer    "cut/burned off left ear and face" (09/21/2013)   Sleep apnea    Small bowel obstruction (HCC) 1978   Status post reversal of ileostomy 07/23/2017   Thrombocytopenia (HCC)    problems since 1978   Wears glasses reading       Dispostion: I considered admission for this patient, and given confusion with positive urine cultures patient require hospital admission     Final Clinical Impression(s) / ED Diagnoses Final diagnoses:  None     @PCDICTATION @    Glendora Score, MD 07/20/23 1142

## 2023-07-19 NOTE — Progress Notes (Signed)
ED Pharmacy Antibiotic Sign Off An antibiotic consult was received from an ED provider for Cefepime per pharmacy dosing for UTI. A chart review was completed to assess appropriateness.   The following one time order(s) were placed:  Cefepime 2g IV x1  Further antibiotic and/or antibiotic pharmacy consults should be ordered by the admitting provider if indicated.   Thank you for allowing pharmacy to be a part of this patient's care.   Lynann Beaver PharmD, BCPS [facility] main pharmacy [phone] 07/19/2023 9:04 PM

## 2023-07-20 DIAGNOSIS — N390 Urinary tract infection, site not specified: Secondary | ICD-10-CM

## 2023-07-20 DIAGNOSIS — N1832 Chronic kidney disease, stage 3b: Secondary | ICD-10-CM

## 2023-07-20 DIAGNOSIS — E538 Deficiency of other specified B group vitamins: Secondary | ICD-10-CM

## 2023-07-20 LAB — CBC
HCT: 32.7 % — ABNORMAL LOW (ref 39.0–52.0)
Hemoglobin: 10 g/dL — ABNORMAL LOW (ref 13.0–17.0)
MCH: 28.3 pg (ref 26.0–34.0)
MCHC: 30.6 g/dL (ref 30.0–36.0)
MCV: 92.6 fL (ref 80.0–100.0)
Platelets: 108 10*3/uL — ABNORMAL LOW (ref 150–400)
RBC: 3.53 MIL/uL — ABNORMAL LOW (ref 4.22–5.81)
RDW: 15.1 % (ref 11.5–15.5)
WBC: 4.6 10*3/uL (ref 4.0–10.5)
nRBC: 0 % (ref 0.0–0.2)

## 2023-07-20 LAB — BASIC METABOLIC PANEL
Anion gap: 6 (ref 5–15)
BUN: 22 mg/dL (ref 8–23)
CO2: 22 mmol/L (ref 22–32)
Calcium: 8.6 mg/dL — ABNORMAL LOW (ref 8.9–10.3)
Chloride: 110 mmol/L (ref 98–111)
Creatinine, Ser: 2.11 mg/dL — ABNORMAL HIGH (ref 0.61–1.24)
GFR, Estimated: 31 mL/min — ABNORMAL LOW (ref >60)
Glucose, Bld: 127 mg/dL — ABNORMAL HIGH (ref 70–99)
Potassium: 4.5 mmol/L (ref 3.5–5.1)
Sodium: 138 mmol/L (ref 135–145)

## 2023-07-20 MED ORDER — ALUM & MAG HYDROXIDE-SIMETH 200-200-20 MG/5ML PO SUSP: 30.0000 mL | Freq: Four times a day (QID) | ORAL | Status: DC | PRN

## 2023-07-20 MED ORDER — LEVOTHYROXINE SODIUM 50 MCG PO TABS: 75.0000 ug | ORAL_TABLET | Freq: Every morning | ORAL | Status: DC

## 2023-07-20 MED ORDER — VITAMIN B-12 1000 MCG PO TABS
1000.0000 ug | ORAL_TABLET | Freq: Every day | ORAL | Status: DC
  Administered 2023-07-20 – 2023-07-24 (×5): 1000 ug via ORAL
  Filled 2023-07-20 (×5): qty 1

## 2023-07-20 MED ORDER — HYDRALAZINE HCL 25 MG PO TABS
25.0000 mg | ORAL_TABLET | Freq: Every day | ORAL | Status: DC
  Administered 2023-07-20 – 2023-07-24 (×5): 25 mg via ORAL
  Filled 2023-07-20 (×5): qty 1

## 2023-07-20 MED ORDER — VITAMIN D 25 MCG (1000 UNIT) PO TABS
2000.0000 [IU] | ORAL_TABLET | Freq: Every day | ORAL | Status: DC
  Administered 2023-07-20 – 2023-07-24 (×5): 2000 [IU] via ORAL
  Filled 2023-07-20 (×5): qty 2

## 2023-07-20 MED ORDER — SODIUM BICARBONATE 650 MG PO TABS
1300.0000 mg | ORAL_TABLET | Freq: Two times a day (BID) | ORAL | Status: DC
  Administered 2023-07-20 – 2023-07-24 (×10): 1300 mg via ORAL
  Filled 2023-07-20 (×10): qty 2

## 2023-07-20 MED ORDER — ROSUVASTATIN CALCIUM 10 MG PO TABS
10.0000 mg | ORAL_TABLET | Freq: Every day | ORAL | Status: DC
  Administered 2023-07-20 – 2023-07-24 (×5): 10 mg via ORAL
  Filled 2023-07-20 (×5): qty 1

## 2023-07-20 MED ORDER — FEBUXOSTAT 80 MG PO TABS: 80.0000 mg | ORAL_TABLET | Freq: Every morning | ORAL | Status: DC

## 2023-07-20 MED ORDER — PANTOPRAZOLE SODIUM 40 MG PO TBEC
40.0000 mg | DELAYED_RELEASE_TABLET | Freq: Every day | ORAL | Status: DC
  Administered 2023-07-20 – 2023-07-24 (×5): 40 mg via ORAL
  Filled 2023-07-20 (×5): qty 1

## 2023-07-20 MED ORDER — MORPHINE SULFATE (PF) 2 MG/ML IV SOLN
1.0000 mg | Freq: Once | INTRAVENOUS | Status: AC | PRN
  Administered 2023-07-20: 1 mg via INTRAVENOUS
  Filled 2023-07-20: qty 1

## 2023-07-20 MED ORDER — SODIUM CHLORIDE 0.9 % IV SOLN
2.0000 g | INTRAVENOUS | Status: DC
  Administered 2023-07-20: 2 g via INTRAVENOUS
  Filled 2023-07-20 (×2): qty 12.5

## 2023-07-20 MED ORDER — AMIODARONE HCL 200 MG PO TABS
200.0000 mg | ORAL_TABLET | Freq: Every day | ORAL | Status: DC
  Administered 2023-07-20 – 2023-07-24 (×5): 200 mg via ORAL
  Filled 2023-07-20 (×6): qty 1

## 2023-07-20 MED ORDER — PAROXETINE HCL 20 MG PO TABS: 20.0000 mg | ORAL_TABLET | Freq: Every morning | ORAL | Status: DC

## 2023-07-20 MED ORDER — FEBUXOSTAT 40 MG PO TABS
80.0000 mg | ORAL_TABLET | Freq: Every day | ORAL | Status: DC
  Administered 2023-07-20 – 2023-07-24 (×5): 80 mg via ORAL
  Filled 2023-07-20 (×5): qty 2

## 2023-07-20 MED ORDER — LEVOTHYROXINE SODIUM 50 MCG PO TABS
50.0000 ug | ORAL_TABLET | Freq: Every morning | ORAL | Status: DC
  Administered 2023-07-21 – 2023-07-24 (×4): 50 ug via ORAL
  Filled 2023-07-20 (×4): qty 1

## 2023-07-20 MED ORDER — PAROXETINE HCL 20 MG PO TABS
20.0000 mg | ORAL_TABLET | Freq: Every day | ORAL | Status: DC
  Administered 2023-07-20 – 2023-07-24 (×5): 20 mg via ORAL
  Filled 2023-07-20 (×5): qty 1

## 2023-07-20 NOTE — Progress Notes (Signed)
Pharmacy Antibiotic Note  Derek [name] is a 82 y.o. male admitted on 07/19/2023 with UTI.  Pharmacy has been consulted for Cefepime dosing.  Active Problem(s): Confusion, worsening R flank pain x 6 wks,  - Was seen at urgent care at the [facility] where he receives a urinalysis with urine culture growing Morganella morganii and Pseudomonas resistant to multiple oral medications.    ID: MDR UTI from [place] (but UA at [facility] negative)  UCx from [place]: to be uploaded when found  Cefepime 12/16>>  Plan: Cefepime 2g IV q24h Will try to locate paper culture report in ED for upload.    Height: 5\' 11"  (180.3 cm) Weight: 86.6 kg (191 lb) IBW/kg (Calculated) : 75.3  Temp (24hrs), Avg:97.8 F (36.6 C), Min:97.6 F (36.4 C), Max:98.2 F (36.8 C)  Recent Labs  Lab 07/19/23 1340 07/19/23 1817 07/20/23 0530  WBC  --  7.1 4.6  CREATININE 2.31*  --  2.11*    Estimated Creatinine Clearance: 29.7 mL/min (A) (by C-G formula based on SCr of 2.11 mg/dL (H)).    Allergies  Allergen Reactions   Lorazepam Other (See Comments)    Hallucinations    Other Other (See Comments)    Cannot have ct scans with dye due to kidney function   Humira [Adalimumab] Other (See Comments)    Pt states that he got pancreatitis because of this    Colchicine Other (See Comments)    Cramps    Quinolones Other (See Comments)    Patient was warned about not using Cipro and similar antibiotics. Recent studies have raised concern that fluoroquinolone antibiotics could be associated with an increased risk of aortic aneurysm Fluoroquinolones have non-antimicrobial properties that might jeopardise the integrity of the extracellular matrix of the vascular wall In a  propensity score matched cohort study in [place], there was a 66% increased rate of aortic aneurysm or dissection associated with oral fluoroquinolone use, compared wit   Abrish Erny S. Merilynn Finland, PharmD, BCPS Clinical Staff Pharmacist [email or web address]  Pasty Spillers 07/20/2023 9:06 AM

## 2023-07-20 NOTE — Assessment & Plan Note (Signed)
-  Continue home B12 supplement

## 2023-07-20 NOTE — Assessment & Plan Note (Signed)
Unable to track down his [facility] report.  Per patient he was told from [place] that he need to go to ED for IV antibiotics based on urine culture results. UA was not very impressive but urine cultures are pending. -Continue with cefepime for now-we will discontinue if cultures came back negative

## 2023-07-20 NOTE — Progress Notes (Signed)
Progress Note   Patient: Derek [name]:[id number] DOB: [birth year] DOA: 07/19/2023     1 DOS: the patient was seen and examined on 07/20/2023   Brief hospital course: Taken from H&P.  Derek [name] is a 82 y.o. male with medical history significant for paroxysmal atrial fibrillation on Eliquis, nonischemic cardiomyopathy, remote PE, stage III CKD, history of ascending aneurysm, PTSD, and hypertension who is brought in by his wife because of confusion.   Most of the history was obtained via chart review.  Per patient he was having right flank pain for about 6 weeks, recently was seen at a [place] urgent care where UA and urine cultures were obtained and then he received a call at home to come to emergency room due to his urine culture.  Per patient he has a long history of resistant infection but could not recall more.  Patient brought urine culture report with him and according to that fine to have MDR Morganella and Pseudomonas, based on that report he was started on cefepime.  12/17: Hemodynamically stable, labs with creatinine of 2.11, slightly above the baseline but not meeting the criteria for AKI.  No leukocytosis, UA was mostly negative except rare bacteria and positive WBC cast, urine cultures were obtained and pending.    Assessment and Plan: * Encephalopathy acute Mentation at baseline now. -Continue to monitor  UTI (urinary tract infection) Unable to track down his [facility] report.  Per patient he was told from [place] that he need to go to ED for IV antibiotics based on urine culture results. UA was not very impressive but urine cultures are pending. -Continue with cefepime for now-we will discontinue if cultures came back negative  Atrial fibrillation (HCC) -Continue home amiodarone and Eliquis  CKD (chronic kidney disease) stage 3, GFR 30-59 ml/min (HCC) Creatinine close to baseline. Getting some IV fluid -Monitor renal function -Avoid nephro toxins  B12  deficiency -Continue home B12 supplement   Subjective: Patient was seen and examined today.  No new concern, on no urinary symptoms.  Per patient he recently had a urine culture from [place] and received a call yesterday morning that he need to go to ED for IV antibiotics.  Per patient he did bring the report and handed over to ED.  Physical Exam: Vitals:   07/20/23 1130 07/20/23 1245 07/20/23 1350 07/20/23 1430  BP: (!) 142/63  111/63 131/69  Pulse: (!) 53 60 70 (!) 58  Resp: 14 (!) 22 20 18   Temp:  97.6 F (36.4 C)  (!) 97.5 F (36.4 C)  TempSrc:  Oral  Oral  SpO2: 100% 100% 100% 99%  Weight:      Height:       General.  Well-developed elderly man, in no acute distress. Pulmonary.  Lungs clear bilaterally, normal respiratory effort. CV.  Regular rate and rhythm, no JVD, rub or murmur. Abdomen.  Soft, nontender, nondistended, BS positive. CNS.  Alert and oriented .  No focal neurologic deficit. Extremities.  No edema, no cyanosis, pulses intact and symmetrical. Psychiatry.  Judgment and insight appears normal.   Data Reviewed: Prior data reviewed  Family Communication:   Disposition: Status is: Inpatient Remains inpatient appropriate because: Severity of illness  Planned Discharge Destination: Home  DVT prophylaxis.  Eliquis Time spent: 50 minutes  This record has been created using Conservation officer, historic buildings. Errors have been sought and corrected,but may not always be located. Such creation errors do not reflect on the standard of care.  Author: Arnetha Courser, MD 07/20/2023 2:31 PM  For on call review [email or web address].

## 2023-07-20 NOTE — Assessment & Plan Note (Signed)
Creatinine close to baseline. Getting some IV fluid -Monitor renal function -Avoid nephro toxins

## 2023-07-20 NOTE — ED Notes (Signed)
Sent messages to pharmacy about medications due that are not verified

## 2023-07-20 NOTE — Assessment & Plan Note (Signed)
 Continue home amiodarone and Eliquis ?

## 2023-07-20 NOTE — Assessment & Plan Note (Signed)
Mentation at baseline now. -Continue to monitor

## 2023-07-20 NOTE — Hospital Course (Signed)
Taken from H&P.  Derek [name] is a 82 y.o. male with medical history significant for paroxysmal atrial fibrillation on Eliquis, nonischemic cardiomyopathy, remote PE, stage III CKD, history of ascending aneurysm, PTSD, and hypertension who is brought in by his wife because of confusion.   Most of the history was obtained via chart review.  Per patient he was having right flank pain for about 6 weeks, recently was seen at a [place] urgent care where UA and urine cultures were obtained and then he received a call at home to come to emergency room due to his urine culture.  Per patient he has a long history of resistant infection but could not recall more.  Patient brought urine culture report with him and according to that fine to have MDR Morganella and Pseudomonas, based on that report he was started on cefepime.  12/17: Hemodynamically stable, labs with creatinine of 2.11, slightly above the baseline but not meeting the criteria for AKI.  No leukocytosis, UA was mostly negative except rare bacteria and positive WBC cast, urine cultures were obtained and pending.

## 2023-07-21 LAB — MAGNESIUM: Magnesium: 1.5 mg/dL — ABNORMAL LOW (ref 1.7–2.4)

## 2023-07-21 LAB — BASIC METABOLIC PANEL
Anion gap: 7 (ref 5–15)
BUN: 21 mg/dL (ref 8–23)
CO2: 20 mmol/L — ABNORMAL LOW (ref 22–32)
Calcium: 8.6 mg/dL — ABNORMAL LOW (ref 8.9–10.3)
Chloride: 109 mmol/L (ref 98–111)
Creatinine, Ser: 1.98 mg/dL — ABNORMAL HIGH (ref 0.61–1.24)
GFR, Estimated: 34 mL/min — ABNORMAL LOW (ref >60)
Glucose, Bld: 97 mg/dL (ref 70–99)
Potassium: 4.4 mmol/L (ref 3.5–5.1)
Sodium: 136 mmol/L (ref 135–145)

## 2023-07-21 MED ORDER — GENTAMICIN SULFATE 40 MG/ML IJ SOLN
5.0000 mg/kg | Freq: Once | INTRAVENOUS | Status: AC
  Administered 2023-07-21: 400 mg via INTRAVENOUS
  Filled 2023-07-21: qty 10

## 2023-07-21 MED ORDER — OXYCODONE HCL 5 MG PO TABS
5.0000 mg | ORAL_TABLET | ORAL | Status: AC | PRN
  Administered 2023-07-21 – 2023-07-22 (×4): 5 mg via ORAL
  Filled 2023-07-21 (×4): qty 1

## 2023-07-21 MED ORDER — SENNOSIDES-DOCUSATE SODIUM 8.6-50 MG PO TABS
2.0000 | ORAL_TABLET | Freq: Two times a day (BID) | ORAL | Status: DC
  Administered 2023-07-21 – 2023-07-24 (×3): 2 via ORAL
  Filled 2023-07-21 (×8): qty 2

## 2023-07-21 MED ORDER — MAGNESIUM SULFATE 2 GM/50ML IV SOLN
2.0000 g | Freq: Once | INTRAVENOUS | Status: AC
  Administered 2023-07-21: 2 g via INTRAVENOUS
  Filled 2023-07-21: qty 50

## 2023-07-21 NOTE — Assessment & Plan Note (Signed)
VA report is in the media section of the chart. It seems to show infection with psuedomonas as well as morganella.  The Pseudomonas is pansensitive.  On the other hand it is noted that Morganella is resistant to penicillins and cefazolin.  It has not been tested for cefepime.  It is sensitive to Zosyn and Bactrim. No mention of ESBL.  Patient is pending urine cultures from our admission 2 days ago.  Patient is not having any fevers or rigors or any suprapubic pain.  Patient has intermittent right flank pain that I attribute to his known kidney stone disease. Clinically paient is rsponsding well.   I discussed with our pharmacy team and it seems like we do not test Morganella for cefepime sensitivity. I called the micro lab and requested that cefepime testing be completed.. I think based on current data we have, patient should be able to treat patient with cefepime for complicated urinary tract infection in the setting of renal stones. Obviously if the morganella turns out to be resistatn, we will change course.  We will follow-up the final sensitivity for Morganella from the cultures done at [facility] system.

## 2023-07-21 NOTE — Progress Notes (Signed)
Mobility Specialist - Progress Note   07/21/23 0848  Mobility  Activity Ambulated independently in hallway  Level of Assistance Independent  Assistive Device None  Distance Ambulated (ft) 500 ft  Activity Response Tolerated well  Mobility Referral Yes  Mobility visit 1 Mobility  Mobility Specialist Start Time (ACUTE ONLY) 0841  Mobility Specialist Stop Time (ACUTE ONLY) 0847  Mobility Specialist Time Calculation (min) (ACUTE ONLY) 6 min   Pt received in bed and agreeable to mobility. C/o lower back pain during ambulation. Pt to bed after session with all needs met.   [facility]

## 2023-07-21 NOTE — Progress Notes (Signed)
   07/21/23 1005  TOC Brief Assessment  Insurance and Status Reviewed  Patient has primary care physician Yes  Home environment has been reviewed home with spouse  Prior level of function: independent  Prior/Current Home Services No current home services  Social Drivers of Health Review SDOH reviewed no interventions necessary  Readmission risk has been reviewed Yes  Transition of care needs no transition of care needs at this time

## 2023-07-21 NOTE — Progress Notes (Signed)
Progress Note   Patient: Derek [name]:[id number] DOB: 11/02/1940 DOA: 07/19/2023     2 DOS: the patient was seen and examined on 07/21/2023   Brief hospital course: Taken from H&P.  Derek [name] is a 82 y.o. male with medical history significant for paroxysmal atrial fibrillation on Eliquis, nonischemic cardiomyopathy, remote PE, stage III CKD, history of ascending aneurysm, PTSD, and hypertension who is brought in by his wife because of confusion.   Most of the history was obtained via chart review.  Per patient he was having right flank pain for about 6 weeks, recently was seen at a [place] urgent care where UA and urine cultures were obtained and then he received a call at home to come to emergency room due to his urine culture.  Per patient he has a long history of resistant infection but could not recall more.  Patient brought urine culture report with him and according to that fine to have MDR Morganella and Pseudomonas, based on that report he was started on cefepime.  12/17: Hemodynamically stable, labs with creatinine of 2.11, slightly above the baseline but not meeting the criteria for AKI.  No leukocytosis, UA was mostly negative except rare bacteria and positive WBC cast, urine cultures were obtained and pending.    Assessment and Plan: * Encephalopathy acute Mentation at baseline now. -Continue to monitor  UTI (urinary tract infection) [facility] report is in the media section of the chart. It seems to show infection with psuedomonas as well as morganella.  The Pseudomonas is pansensitive.  On the other hand it is noted that Morganella is resistant to penicillins and cefazolin.  It has not been tested for cefepime.  It is sensitive to Zosyn and Bactrim. No mention of ESBL.  Patient is pending urine cultures from our admission 2 days ago.  Patient is not having any fevers or rigors or any suprapubic pain.  Patient has intermittent right flank pain that I attribute to his known  kidney stone disease. Clinically paient is rsponsding well.   I discussed with our pharmacy team and it seems like we do not test Morganella for cefepime sensitivity. I called the micro lab and requested that cefepime testing be completed.. I think based on current data we have, patient should be able to treat patient with cefepime for complicated urinary tract infection in the setting of renal stones. Obviously if the morganella turns out to be resistatn, we will change course.  We will follow-up the final sensitivity for Morganella from the cultures done at [facility].  Atrial fibrillation (HCC) -Continue home amiodarone and Eliquis  CKD (chronic kidney disease) stage 3, GFR 30-59 ml/min (HCC) Creatinine close to baseline. Getting some IV fluid -Monitor renal function -Avoid nephro toxins  B12 deficiency -Continue home B12 supplement        Subjective: Patient has been having intermittent right flank pain unrelated to posture.  This is subacute and proceeds to current admission.  Physical Exam: Vitals:   07/20/23 1718 07/20/23 2041 07/21/23 0513 07/21/23 1200  BP: (!) 112/53 124/65 116/69 122/67  Pulse: 66 (!) 57 (!) 59 62  Resp: (!) 21 18 17 18   Temp: 97.7 F (36.5 C) 97.7 F (36.5 C) (!) 97.5 F (36.4 C) (!) 97.5 F (36.4 C)  TempSrc: Oral Oral Oral Oral  SpO2: 100% 99% 97% 99%  Weight:      Height:       : Patient is alert and awake no distress, gives a coherent  account of his medical history from before this admission. Respiratory exam: Bilateral intravesicular Cardiovascular exam S1-S2 normal Abdomen all quadrant soft nontender Extremities warm without edema Data Reviewed:  Labs on Admission:  Results for orders placed or performed during the hospital encounter of 07/19/23 (from the past 24 hours)  Basic metabolic panel     Status: Abnormal   Collection Time: 07/21/23  4:57 AM  Result Value Ref Range   Sodium 136 135 - 145 mmol/L   Potassium 4.4 3.5 - 5.1  mmol/L   Chloride 109 98 - 111 mmol/L   CO2 20 (L) 22 - 32 mmol/L   Glucose, Bld 97 70 - 99 mg/dL   BUN 21 8 - 23 mg/dL   Creatinine, Ser 8.65 (H) 0.61 - 1.24 mg/dL   Calcium 8.6 (L) 8.9 - 10.3 mg/dL   GFR, Estimated 34 (L) >60 mL/min   Anion gap 7 5 - 15  Magnesium     Status: Abnormal   Collection Time: 07/21/23  4:57 AM  Result Value Ref Range   Magnesium 1.5 (L) 1.7 - 2.4 mg/dL   Basic Metabolic Panel: Recent Labs  Lab 07/19/23 1340 07/20/23 0530 07/21/23 0457  NA 139 138 136  K 4.6 4.5 4.4  CL 109 110 109  CO2 19* 22 20*  GLUCOSE 99 127* 97  BUN 20 22 21   CREATININE 2.31* 2.11* 1.98*  CALCIUM 8.8* 8.6* 8.6*  MG  --   --  1.5*   Liver Function Tests: Recent Labs  Lab 07/19/23 1340  AST 28  ALT 16  ALKPHOS 129*  BILITOT 0.6  PROT 6.7  ALBUMIN 3.9   No results for input(s): "LIPASE", "AMYLASE" in the last 168 hours. No results for input(s): "AMMONIA" in the last 168 hours. CBC: Recent Labs  Lab 07/19/23 1817 07/20/23 0530  WBC 7.1 4.6  NEUTROABS 5.4  --   HGB 11.5* 10.0*  HCT 37.0* 32.7*  MCV 91.6 92.6  PLT 142* 108*   Cardiac Enzymes: No results for input(s): "CKTOTAL", "CKMB", "CKMBINDEX", "TROPONINIHS" in the last 168 hours.  BNP (last 3 results) No results for input(s): "PROBNP" in the last 8760 hours. CBG: No results for input(s): "GLUCAP" in the last 168 hours.  Radiological Exams on Admission:  CT Renal Stone Study Result Date: 07/19/2023 CLINICAL DATA:  Right-sided flank pain EXAM: CT ABDOMEN AND PELVIS WITHOUT CONTRAST TECHNIQUE: Multidetector CT imaging of the abdomen and pelvis was performed following the standard protocol without IV contrast. RADIATION DOSE REDUCTION: This exam was performed according to the departmental dose-optimization program which includes automated exposure control, adjustment of the mA and/or kV according to patient size and/or use of iterative reconstruction technique. COMPARISON:  CT 05/16/2023, 03/27/2021  FINDINGS: Lower chest: Lung bases demonstrate no acute airspace disease. Stable small Peri fissural left lower lobe pulmonary nodule on series 5, image 5, no imaging follow-up is recommended. No acute airspace disease. Hepatobiliary: Cholecystectomy. No focal hepatic abnormality or biliary dilatation. Pancreas: Unremarkable. No pancreatic ductal dilatation or surrounding inflammatory changes. Spleen: Normal in size without focal abnormality. Adrenals/Urinary Tract: Adrenal glands are normal. Kidneys show no hydronephrosis. Cortical scarring within the kidneys. Multiple hypodense renal cysts, no imaging follow-up is recommended. Small bilateral kidney stones. No ureteral stone. The bladder is unremarkable Stomach/Bowel: The stomach is unremarkable. Fluid-filled bowel without wall thickening or convincing obstruction. Status post right hemicolectomy. Vascular/Lymphatic: Moderate aortic atherosclerosis. No aneurysm. No suspicious lymph nodes Reproductive: Prostate is unremarkable. Other: Negative for pelvic effusion or free air.  Scarring within the anterior abdominal wall Musculoskeletal: No acute or suspicious osseous abnormality. IMPRESSION: 1. Negative for hydronephrosis or hydroureter. Small bilateral kidney stones. 2. Status post right hemicolectomy. Fluid-filled bowel without wall thickening or convincing obstruction. 3. Aortic atherosclerosis. Aortic Atherosclerosis (ICD10-I70.0). Electronically Signed   By: Jasmine Pang M.D.   On: 07/19/2023 22:29     Family Communication: patient declined offer to call family.  Disposition: Status is: Inpatient Remains inpatient appropriate because: need morganela sensistivity  Planned Discharge Destination: Home    Time spent: 35 minutes  Author: Nolberto Hanlon, MD 07/21/2023 4:06 PM  For on call review [email or web address].

## 2023-07-22 DIAGNOSIS — G9340 Encephalopathy, unspecified: Secondary | ICD-10-CM | POA: Diagnosis not present

## 2023-07-22 LAB — BASIC METABOLIC PANEL
Anion gap: 10 (ref 5–15)
BUN: 18 mg/dL (ref 8–23)
CO2: 17 mmol/L — ABNORMAL LOW (ref 22–32)
Calcium: 8.6 mg/dL — ABNORMAL LOW (ref 8.9–10.3)
Chloride: 109 mmol/L (ref 98–111)
Creatinine, Ser: 1.94 mg/dL — ABNORMAL HIGH (ref 0.61–1.24)
GFR, Estimated: 34 mL/min — ABNORMAL LOW (ref >60)
Glucose, Bld: 91 mg/dL (ref 70–99)
Potassium: 4.6 mmol/L (ref 3.5–5.1)
Sodium: 136 mmol/L (ref 135–145)

## 2023-07-22 LAB — CBC
HCT: 33.8 % — ABNORMAL LOW (ref 39.0–52.0)
Hemoglobin: 10.3 g/dL — ABNORMAL LOW (ref 13.0–17.0)
MCH: 28.1 pg (ref 26.0–34.0)
MCHC: 30.5 g/dL (ref 30.0–36.0)
MCV: 92.3 fL (ref 80.0–100.0)
Platelets: 113 10*3/uL — ABNORMAL LOW (ref 150–400)
RBC: 3.66 MIL/uL — ABNORMAL LOW (ref 4.22–5.81)
RDW: 15.1 % (ref 11.5–15.5)
WBC: 5 10*3/uL (ref 4.0–10.5)
nRBC: 0 % (ref 0.0–0.2)

## 2023-07-22 LAB — MAGNESIUM: Magnesium: 1.8 mg/dL (ref 1.7–2.4)

## 2023-07-22 MED ORDER — SODIUM CHLORIDE 0.9 % IV SOLN
2.0000 g | INTRAVENOUS | Status: DC
  Administered 2023-07-22: 2 g via INTRAVENOUS
  Filled 2023-07-22 (×2): qty 12.5

## 2023-07-22 MED ORDER — OXYCODONE HCL 5 MG PO TABS
5.0000 mg | ORAL_TABLET | ORAL | Status: AC | PRN
  Filled 2023-07-22 (×2): qty 1

## 2023-07-22 NOTE — Progress Notes (Signed)
Pharmacy Antibiotic Note  Derek [name] is a 82 y.o. male admitted on 07/19/2023 with UTI.  Pharmacy has been consulted for Cefepime dosing.  Active Problem(s): Confusion, worsening R flank pain x 6 wks,  - Was seen at urgent care at the [facility] where he receives a urinalysis with urine culture growing Morganella morganii and Pseudomonas resistant to multiple oral medications.    ID: sent to ED from [place] for MDR UTI  - [facility] UCx growing M morganii and PsA (see sensitivities under Media tab) - Pt denies urinary symptoms, reports lower back pain for months, possibly colonization - MD wants to continue abx for now and see what [facility] UCx grows  12/16 Cefepime >>12/17, 12/19>> 12/18 gent x 1 (ID PharmD recomm.) (FQ contraindicated with AAA)  12/16 UCx: 50K PsA (pan-sensitive), 20K Morganella morganii ( R to Amp/Unasyn, NItrofur, Cipro) VA cultures uploaded under Media Tab  Plan: Cefepime 2g IV q24h   Height: 5\' 11"  (180.3 cm) Weight: 86.6 kg (191 lb) IBW/kg (Calculated) : 75.3  Temp (24hrs), Avg:97.8 F (36.6 C), Min:97.5 F (36.4 C), Max:98 F (36.7 C)  Recent Labs  Lab 07/19/23 1340 07/19/23 1817 07/20/23 0530 07/21/23 0457 07/22/23 0508  WBC  --  7.1 4.6  --  5.0  CREATININE 2.31*  --  2.11* 1.98* 1.94*    Estimated Creatinine Clearance: 32.3 mL/min (A) (by C-G formula based on SCr of 1.94 mg/dL (H)).    Allergies  Allergen Reactions   Lorazepam Other (See Comments)    Hallucinations    Other Other (See Comments)    Cannot have ct scans with dye due to kidney function   Humira [Adalimumab] Other (See Comments)    Pt states that he got pancreatitis because of this    Colchicine Other (See Comments)    Cramps    Quinolones Other (See Comments)    Patient was warned about not using Cipro and similar antibiotics. Recent studies have raised concern that fluoroquinolone antibiotics could be associated with an increased risk of aortic aneurysm Fluoroquinolones have  non-antimicrobial properties that might jeopardise the integrity of the extracellular matrix of the vascular wall In a  propensity score matched cohort study in [place], there was a 66% increased rate of aortic aneurysm or dissection associated with oral fluoroquinolone use, compared wit   Harbor Paster S. Merilynn Finland, PharmD, BCPS Clinical Staff Pharmacist [email or web address]  Pasty Spillers 07/22/2023 3:55 PM

## 2023-07-22 NOTE — Progress Notes (Signed)
Mobility Specialist - Progress Note   07/22/23 0839  Mobility  Activity Ambulated independently in hallway  Level of Assistance Independent  Assistive Device None  Distance Ambulated (ft) 600 ft  Activity Response Tolerated well  Mobility Referral Yes  Mobility visit 1 Mobility  Mobility Specialist Start Time (ACUTE ONLY) 0934  Mobility Specialist Stop Time (ACUTE ONLY) 0939  Mobility Specialist Time Calculation (min) (ACUTE ONLY) 5 min   Pt received in bed and agreeable to mobility. No complaints during session. Pt to bed after session with all needs met.    [facility]

## 2023-07-22 NOTE — Plan of Care (Signed)

## 2023-07-22 NOTE — Plan of Care (Signed)
?  Problem: Clinical Measurements: ?Goal: Will remain free from infection ?Outcome: Progressing ?  ?

## 2023-07-22 NOTE — Progress Notes (Signed)
PROGRESS NOTE    Derek [name]  ZOX:[id number] DOB: May 18, 1941 DOA: 07/19/2023 PCP: Assunta Found, MD   Brief Narrative: 82 year old with past medical history significant for paroxysmal A-fib on Eliquis, nonischemic cardiomyopathy, remote PE, stage III CKD, history of ascending aneurysm, PTSD, hypertension presents from home with concern for confusion.  Patient has been having flank pain for about 6 weeks, recently seen at the [facility] urgent care where UA and urine culture were obtained, he received a call from the emergency room and advised to presented for further evaluation.  Urine culture was growing resistance organism, Morganella and Pseudomonas.   Assessment & Plan:   Principal Problem:   Encephalopathy acute Active Problems:   UTI (urinary tract infection)   Atrial fibrillation (HCC)   CKD (chronic kidney disease) stage 3, GFR 30-59 ml/min (HCC)   B12 deficiency   1-Acute metabolic encephalopathy in the setting of UTI Improving  2- Urinary tract infection: Patient presented with flank pain for 6 weeks now was evaluated in the ED PA and urine culture was growing Pseudomonas and Morganella.  Pseudomonas was pansensitive, Morganella is resistant to penicillin and cefazolin. -Awaiting sensitivity for Morganella to cefepime -Continue with IV cefepime for now -Will discuss case with ID in the morning -He received a dose of gentamicin  3-A-fib: Continue with amiodarone and Eliquis  CKD stage IIIb: Creatinine baseline 1.9-2.1 Renal function is stable. Metabolic acidosis: Continue with  bicarb supplements  Hypomagnesemia replace    B12 deficiency: Continue with B12 supplements Estimated body mass index is 26.64 kg/m as calculated from the following:   Height as of this encounter: 5\' 11"  (1.803 m).   Weight as of this encounter: 86.6 kg.   DVT prophylaxis: Eliquis Code Status: Full Code Family Communication: Wife at bedside Disposition Plan:  Status is:  Inpatient Remains inpatient appropriate because: Management of urinary tract infection    Consultants:  None  Procedures:  none  Antimicrobials:    Subjective: , Report flank pain has been improving, he did had some suprapubic pain.  Denies dysuria  Objective: Vitals:   07/21/23 0513 07/21/23 1200 07/21/23 2015 07/22/23 0620  BP: 116/69 122/67 132/66 137/70  Pulse: (!) 59 62 (!) 56 (!) 57  Resp: 17 18 17 17   Temp: (!) 97.5 F (36.4 C) (!) 97.5 F (36.4 C) 98 F (36.7 C) 98 F (36.7 C)  TempSrc: Oral Oral Oral Oral  SpO2: 97% 99% 100% 97%  Weight:      Height:        Intake/Output Summary (Last 24 hours) at 07/22/2023 0757 Last data filed at 07/22/2023 0600 Gross per 24 hour  Intake 410 ml  Output --  Net 410 ml   Filed Weights   07/19/23 1246  Weight: 86.6 kg    Examination:  General exam: Appears calm and comfortable  Respiratory system: Clear to auscultation. Respiratory effort normal. Cardiovascular system: S1 & S2 heard, RRR. No JVD, murmurs, rubs, gallops or clicks. No pedal edema. Gastrointestinal system: Abdomen is nondistended, soft and nontender. No organomegaly or masses felt. Normal bowel sounds heard. Central nervous system: Alert and oriented. No focal neurological deficits. Extremities: Symmetric 5 x 5 power. Skin: No rashes, lesions or ulcers Psychiatry: Judgement and insight appear normal. Mood & affect appropriate.     Data Reviewed: I have personally reviewed following labs and imaging studies  CBC: Recent Labs  Lab 07/19/23 1817 07/20/23 0530 07/22/23 0508  WBC 7.1 4.6 5.0  NEUTROABS 5.4  --   --  HGB 11.5* 10.0* 10.3*  HCT 37.0* 32.7* 33.8*  MCV 91.6 92.6 92.3  PLT 142* 108* 113*   Basic Metabolic Panel: Recent Labs  Lab 07/19/23 1340 07/20/23 0530 07/21/23 0457 07/22/23 0508  NA 139 138 136 136  K 4.6 4.5 4.4 4.6  CL 109 110 109 109  CO2 19* 22 20* 17*  GLUCOSE 99 127* 97 91  BUN 20 22 21 18   CREATININE 2.31*  2.11* 1.98* 1.94*  CALCIUM 8.8* 8.6* 8.6* 8.6*  MG  --   --  1.5* 1.8   GFR: Estimated Creatinine Clearance: 32.3 mL/min (A) (by C-G formula based on SCr of 1.94 mg/dL (H)). Liver Function Tests: Recent Labs  Lab 07/19/23 1340  AST 28  ALT 16  ALKPHOS 129*  BILITOT 0.6  PROT 6.7  ALBUMIN 3.9   No results for input(s): "LIPASE", "AMYLASE" in the last 168 hours. No results for input(s): "AMMONIA" in the last 168 hours. Coagulation Profile: No results for input(s): "INR", "PROTIME" in the last 168 hours. Cardiac Enzymes: No results for input(s): "CKTOTAL", "CKMB", "CKMBINDEX", "TROPONINI" in the last 168 hours. BNP (last 3 results) No results for input(s): "PROBNP" in the last 8760 hours. HbA1C: No results for input(s): "HGBA1C" in the last 72 hours. CBG: No results for input(s): "GLUCAP" in the last 168 hours. Lipid Profile: No results for input(s): "CHOL", "HDL", "LDLCALC", "TRIG", "CHOLHDL", "LDLDIRECT" in the last 72 hours. Thyroid Function Tests: No results for input(s): "TSH", "T4TOTAL", "FREET4", "T3FREE", "THYROIDAB" in the last 72 hours. Anemia Panel: No results for input(s): "VITAMINB12", "FOLATE", "FERRITIN", "TIBC", "IRON", "RETICCTPCT" in the last 72 hours. Sepsis Labs: No results for input(s): "PROCALCITON", "LATICACIDVEN" in the last 168 hours.  Recent Results (from the past 240 hours)  Urine Culture     Status: Abnormal (Preliminary result)   Collection Time: 07/19/23  1:45 PM   Specimen: Urine, Clean Catch  Result Value Ref Range Status   Specimen Description   Final    URINE, CLEAN CATCH Performed at [facility], [address] [place], [place] [address]    Special Requests   Final    NONE Performed at [facility], [address], [place], [place] [address]    Culture (A)  Final    50,000 COLONIES/mL PSEUDOMONAS AERUGINOSA 20,000 COLONIES/mL MORGANELLA MORGANII SUSCEPTIBILITIES TO FOLLOW Performed at [facility], [address] [place], [place] [address]    Report Status PENDING  Incomplete   Organism ID, Bacteria PSEUDOMONAS AERUGINOSA (A)  Final      Susceptibility   Pseudomonas aeruginosa - MIC*    CEFTAZIDIME 2 SENSITIVE Sensitive     CIPROFLOXACIN 0.5 SENSITIVE Sensitive     GENTAMICIN 2 SENSITIVE Sensitive     IMIPENEM 1 SENSITIVE Sensitive     PIP/TAZO 8 SENSITIVE Sensitive ug/mL    CEFEPIME 2 SENSITIVE Sensitive     * 50,000 COLONIES/mL PSEUDOMONAS AERUGINOSA         Radiology Studies: No results found.      Scheduled Meds:  amiodarone  200 mg Oral Daily   apixaban  2.5 mg Oral BID   cholecalciferol  2,000 Units Oral Daily   cyanocobalamin  1,000 mcg Oral Daily   febuxostat  80 mg Oral Daily   hydrALAZINE  25 mg Oral Daily   levothyroxine  50 mcg Oral q morning   pantoprazole  40 mg Oral Daily   PARoxetine  20 mg Oral Daily   rosuvastatin  10  mg Oral Daily   senna-docusate  2 tablet Oral BID   sodium bicarbonate  1,300 mg Oral BID   Continuous Infusions:   LOS: 3 days    Time spent: 35 minutes    Zahriah Roes A Tasmin Exantus, MD [facility]   If 7PM-7AM, please contact night-coverage [email or web address]  07/22/2023, 7:57 AM

## 2023-07-23 ENCOUNTER — Inpatient Hospital Stay (HOSPITAL_COMMUNITY): Payer: No Typology Code available for payment source

## 2023-07-23 DIAGNOSIS — G9340 Encephalopathy, unspecified: Secondary | ICD-10-CM | POA: Diagnosis not present

## 2023-07-23 DIAGNOSIS — R410 Disorientation, unspecified: Secondary | ICD-10-CM | POA: Diagnosis not present

## 2023-07-23 LAB — CBC
HCT: 32.6 % — ABNORMAL LOW (ref 39.0–52.0)
Hemoglobin: 10.2 g/dL — ABNORMAL LOW (ref 13.0–17.0)
MCH: 28.7 pg (ref 26.0–34.0)
MCHC: 31.3 g/dL (ref 30.0–36.0)
MCV: 91.6 fL (ref 80.0–100.0)
Platelets: 106 10*3/uL — ABNORMAL LOW (ref 150–400)
RBC: 3.56 MIL/uL — ABNORMAL LOW (ref 4.22–5.81)
RDW: 14.9 % (ref 11.5–15.5)
WBC: 4.4 10*3/uL (ref 4.0–10.5)
nRBC: 0 % (ref 0.0–0.2)
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
## 2023-07-23 NOTE — Progress Notes (Signed)
PROGRESS NOTE    Derek [name]  YQM:[id number] DOB: 10-14-1940 DOA: 07/19/2023 PCP: Assunta Found, MD   Brief Narrative: 82 year old with past medical history significant for paroxysmal A-fib on Eliquis, nonischemic cardiomyopathy, remote PE, stage III CKD, history of ascending aneurysm, PTSD, hypertension presents from home with concern for confusion.  Patient has been having flank pain for about 6 weeks, recently seen at the [facility] urgent care where UA and urine culture were obtained, he received a call from the emergency room and advised to presented for further evaluation.  Urine culture was growing resistance organism, Morganella and Pseudomonas.   Assessment & Plan:   Principal Problem:   Encephalopathy acute Active Problems:   UTI (urinary tract infection)   Atrial fibrillation (HCC)   CKD (chronic kidney disease) stage 3, GFR 30-59 ml/min (HCC)   B12 deficiency   1-Acute metabolic encephalopathy in the setting of UTI Improving Could be related to muscle relaxant ?   2- Bacteruria not consistent with UTI. Urinary tract infection: Was ruled out. ID doesn't think patient has UTI>  Patient presented with flank pain for 6 weeks now was evaluated in the ED PA and urine culture was growing Pseudomonas and Morganella.  Pseudomonas was pansensitive, Morganella is resistant to penicillin and cefazolin. -received Cefepime for 4 days.   3-A-fib: Continue with amiodarone and Eliquis  CKD stage IIIb: Creatinine baseline 1.9-2.1 Renal function is stable. Metabolic acidosis: Continue with  bicarb supplements  Hypomagnesemia replaced  Back pain: ID recommend MRI Lumbar spine.   B12 deficiency: Continue with B12 supplements Estimated body mass index is 26.64 kg/m as calculated from the following:   Height as of this encounter: 5\' 11"  (1.803 m).   Weight as of this encounter: 86.6 kg.   DVT prophylaxis: Eliquis Code Status: Full Code Family Communication: Wife at  bedside Disposition Plan:  Status is: Inpatient Remains inpatient appropriate because: Management of urinary tract infection    Consultants:  None  Procedures:  none  Antimicrobials:    Subjective: Per wife confusion has improved.  We discussed with ID about stopping antibiotics and observed him. Proceed with MRI to evlauate back pain.   Objective: Vitals:   07/22/23 1328 07/22/23 2155 07/23/23 0621 07/23/23 1344  BP: 129/66 (!) 120/53 128/71 111/62  Pulse: (!) 57 (!) 58 (!) 58 61  Resp: 17 18 18 17   Temp: (!) 97.5 F (36.4 C) 99.1 F (37.3 C) 98.8 F (37.1 C) 97.9 F (36.6 C)  TempSrc: Oral Oral Oral Oral  SpO2: 99% 97% 97% 100%  Weight:      Height:        Intake/Output Summary (Last 24 hours) at 07/23/2023 1608 Last data filed at 07/23/2023 1352 Gross per 24 hour  Intake 1540 ml  Output --  Net 1540 ml   Filed Weights   07/19/23 1246  Weight: 86.6 kg    Examination:  General exam: NAD Respiratory system: CTA Cardiovascular system: SS 1, S 2 RRR Gastrointestinal system: BS present, soft, nt Central nervous system: alert Extremities: no edema   Data Reviewed: I have personally reviewed following labs and imaging studies  CBC: Recent Labs  Lab 07/19/23 1817 07/20/23 0530 07/22/23 0508 07/23/23 0434  WBC 7.1 4.6 5.0 4.4  NEUTROABS 5.4  --   --   --   HGB 11.5* 10.0* 10.3* 10.2*  HCT 37.0* 32.7* 33.8* 32.6*  MCV 91.6 92.6 92.3 91.6  PLT 142* 108* 113* 106*   Basic Metabolic Panel: Recent Labs  Lab 07/19/23 1340 07/20/23 0530 07/21/23 0457 07/22/23 0508  NA 139 138 136 136  K 4.6 4.5 4.4 4.6  CL 109 110 109 109  CO2 19* 22 20* 17*  GLUCOSE 99 127* 97 91  BUN 20 22 21 18   CREATININE 2.31* 2.11* 1.98* 1.94*  CALCIUM 8.8* 8.6* 8.6* 8.6*  MG  --   --  1.5* 1.8   GFR: Estimated Creatinine Clearance: 32.3 mL/min (A) (by C-G formula based on SCr of 1.94 mg/dL (H)). Liver Function Tests: Recent Labs  Lab 07/19/23 1340  AST 28   ALT 16  ALKPHOS 129*  BILITOT 0.6  PROT 6.7  ALBUMIN 3.9   No results for input(s): "LIPASE", "AMYLASE" in the last 168 hours. No results for input(s): "AMMONIA" in the last 168 hours. Coagulation Profile: No results for input(s): "INR", "PROTIME" in the last 168 hours. Cardiac Enzymes: No results for input(s): "CKTOTAL", "CKMB", "CKMBINDEX", "TROPONINI" in the last 168 hours. BNP (last 3 results) No results for input(s): "PROBNP" in the last 8760 hours. HbA1C: No results for input(s): "HGBA1C" in the last 72 hours. CBG: No results for input(s): "GLUCAP" in the last 168 hours. Lipid Profile: No results for input(s): "CHOL", "HDL", "LDLCALC", "TRIG", "CHOLHDL", "LDLDIRECT" in the last 72 hours. Thyroid Function Tests: No results for input(s): "TSH", "T4TOTAL", "FREET4", "T3FREE", "THYROIDAB" in the last 72 hours. Anemia Panel: No results for input(s): "VITAMINB12", "FOLATE", "FERRITIN", "TIBC", "IRON", "RETICCTPCT" in the last 72 hours. Sepsis Labs: No results for input(s): "PROCALCITON", "LATICACIDVEN" in the last 168 hours.  Recent Results (from the past 240 hours)  Urine Culture     Status: Abnormal (Preliminary result)   Collection Time: 07/19/23  1:45 PM   Specimen: Urine, Clean Catch  Result Value Ref Range Status   Specimen Description   Final    URINE, CLEAN CATCH Performed at [facility], [address], [place], [place] [address]    Special Requests   Final    NONE Performed at [facility], [address] [place], [place] [address]    Culture (A)  Final    50,000 COLONIES/mL PSEUDOMONAS AERUGINOSA 20,000 COLONIES/mL MORGANELLA MORGANII Sent to [facility] for further susceptibility testing. Performed at [facility], [address] [place], [place] [address]    Report Status PENDING  Incomplete   Organism ID, Bacteria PSEUDOMONAS AERUGINOSA (A)  Final   Organism ID, Bacteria MORGANELLA MORGANII (A)  Final       Susceptibility   Morganella morganii - MIC*    AMPICILLIN >=32 RESISTANT Resistant     CEFTAZIDIME 2 SENSITIVE Sensitive     CIPROFLOXACIN 0.5 INTERMEDIATE Intermediate     GENTAMICIN <=1 SENSITIVE Sensitive     IMIPENEM 4 SENSITIVE Sensitive     NITROFURANTOIN 128 RESISTANT Resistant     TRIMETH/SULFA <=20 SENSITIVE Sensitive     AMPICILLIN/SULBACTAM >=32 RESISTANT Resistant     PIP/TAZO <=4 SENSITIVE Sensitive ug/mL    * 20,000 COLONIES/mL MORGANELLA MORGANII   Pseudomonas aeruginosa - MIC*    CEFTAZIDIME 2 SENSITIVE Sensitive     CIPROFLOXACIN 0.5 SENSITIVE Sensitive     GENTAMICIN 2 SENSITIVE Sensitive     IMIPENEM 1 SENSITIVE Sensitive     PIP/TAZO 8 SENSITIVE Sensitive ug/mL    CEFEPIME 2 SENSITIVE Sensitive     * 50,000 COLONIES/mL PSEUDOMONAS AERUGINOSA         Radiology Studies: No results found.      Scheduled Meds:  amiodarone  200  mg Oral Daily   apixaban  2.5 mg Oral BID   cholecalciferol  2,000 Units Oral Daily   cyanocobalamin  1,000 mcg Oral Daily   febuxostat  80 mg Oral Daily   hydrALAZINE  25 mg Oral Daily   levothyroxine  50 mcg Oral q morning   pantoprazole  40 mg Oral Daily   PARoxetine  20 mg Oral Daily   rosuvastatin  10 mg Oral Daily   senna-docusate  2 tablet Oral BID   sodium bicarbonate  1,300 mg Oral BID   Continuous Infusions:   LOS: 4 days    Time spent: 35 minutes    Derek Tunks A Kameron Blethen, MD [facility]   If 7PM-7AM, please contact night-coverage [email or web address]  07/23/2023, 4:08 PM

## 2023-07-23 NOTE — Plan of Care (Signed)

## 2023-07-24 DIAGNOSIS — G9340 Encephalopathy, unspecified: Secondary | ICD-10-CM | POA: Diagnosis not present

## 2023-07-24 LAB — BASIC METABOLIC PANEL
Anion gap: 10 (ref 5–15)
BUN: 19 mg/dL (ref 8–23)
CO2: 18 mmol/L — ABNORMAL LOW (ref 22–32)
Calcium: 8.6 mg/dL — ABNORMAL LOW (ref 8.9–10.3)
Chloride: 110 mmol/L (ref 98–111)
Creatinine, Ser: 1.85 mg/dL — ABNORMAL HIGH (ref 0.61–1.24)
GFR, Estimated: 36 mL/min — ABNORMAL LOW (ref >60)
Glucose, Bld: 93 mg/dL (ref 70–99)
Potassium: 4.2 mmol/L (ref 3.5–5.1)
Sodium: 138 mmol/L (ref 135–145)

## 2023-07-24 LAB — CBC
HCT: 34.7 % — ABNORMAL LOW (ref 39.0–52.0)
Hemoglobin: 10.8 g/dL — ABNORMAL LOW (ref 13.0–17.0)
MCH: 28.2 pg (ref 26.0–34.0)
MCHC: 31.1 g/dL (ref 30.0–36.0)
MCV: 90.6 fL (ref 80.0–100.0)
Platelets: 114 10*3/uL — ABNORMAL LOW (ref 150–400)
RBC: 3.83 MIL/uL — ABNORMAL LOW (ref 4.22–5.81)
RDW: 15.1 % (ref 11.5–15.5)
WBC: 4.7 10*3/uL (ref 4.0–10.5)
nRBC: 0 % (ref 0.0–0.2)

## 2023-07-24 MED ORDER — TRAZODONE HCL 50 MG PO TABS: 50.0000 mg | ORAL_TABLET | Freq: Every day | ORAL | 0 refills | Status: DC

## 2023-07-24 MED ORDER — TRAZODONE HCL 50 MG PO TABS: 25.0000 mg | ORAL_TABLET | Freq: Once | ORAL | Status: DC

## 2023-07-24 MED ORDER — TRAZODONE HCL 50 MG PO TABS: 50.0000 mg | ORAL_TABLET | Freq: Every day | ORAL | Status: DC

## 2023-07-24 MED ORDER — TRAZODONE HCL 50 MG PO TABS: 150.0000 mg | ORAL_TABLET | Freq: Every day | ORAL | Status: DC

## 2023-07-24 MED ORDER — MELATONIN 5 MG PO TABS: 5.0000 mg | ORAL_TABLET | Freq: Once | ORAL | Status: DC

## 2023-07-24 MED ORDER — APIXABAN 2.5 MG PO TABS: 2.5000 mg | ORAL_TABLET | Freq: Two times a day (BID) | ORAL | 0 refills | Status: DC

## 2023-07-24 NOTE — Plan of Care (Signed)
  Problem: Education: Goal: Knowledge of General Education information will improve Description: Including pain rating scale, medication(s)/side effects and non-pharmacologic comfort measures 07/24/2023 1031 by Candyce Churn, RN Outcome: Adequate for Discharge 07/24/2023 0759 by Candyce Churn, RN Outcome: Progressing   Problem: Health Behavior/Discharge Planning: Goal: Ability to manage health-related needs will improve 07/24/2023 1031 by Candyce Churn, RN Outcome: Adequate for Discharge 07/24/2023 0759 by Candyce Churn, RN Outcome: Progressing   Problem: Clinical Measurements: Goal: Ability to maintain clinical measurements within normal limits will improve 07/24/2023 1031 by Candyce Churn, RN Outcome: Adequate for Discharge 07/24/2023 0759 by Candyce Churn, RN Outcome: Progressing Goal: Will remain free from infection 07/24/2023 1031 by Candyce Churn, RN Outcome: Adequate for Discharge 07/24/2023 0759 by Candyce Churn, RN Outcome: Progressing Goal: Diagnostic test results will improve 07/24/2023 1031 by Candyce Churn, RN Outcome: Adequate for Discharge 07/24/2023 0759 by Candyce Churn, RN Outcome: Progressing Goal: Respiratory complications will improve 07/24/2023 1031 by Candyce Churn, RN Outcome: Adequate for Discharge 07/24/2023 0759 by Candyce Churn, RN Outcome: Progressing Goal: Cardiovascular complication will be avoided 07/24/2023 1031 by Candyce Churn, RN Outcome: Adequate for Discharge 07/24/2023 0759 by Candyce Churn, RN Outcome: Progressing   Problem: Activity: Goal: Risk for activity intolerance will decrease 07/24/2023 1031 by Candyce Churn, RN Outcome: Adequate for Discharge 07/24/2023 0759 by Candyce Churn, RN Outcome: Progressing   Problem: Nutrition: Goal: Adequate nutrition will be maintained 07/24/2023 1031 by Candyce Churn, RN Outcome: Adequate for Discharge 07/24/2023 0759 by Candyce Churn, RN Outcome: Progressing    Problem: Coping: Goal: Level of anxiety will decrease 07/24/2023 1031 by Candyce Churn, RN Outcome: Adequate for Discharge 07/24/2023 0759 by Candyce Churn, RN Outcome: Progressing   Problem: Safety: Goal: Ability to remain free from injury will improve 07/24/2023 1031 by Candyce Churn, RN Outcome: Adequate for Discharge 07/24/2023 0759 by Candyce Churn, RN Outcome: Progressing   Problem: Skin Integrity: Goal: Risk for impaired skin integrity will decrease 07/24/2023 1031 by Candyce Churn, RN Outcome: Adequate for Discharge 07/24/2023 0759 by Candyce Churn, RN Outcome: Progressing

## 2023-07-24 NOTE — Plan of Care (Signed)

## 2023-07-24 NOTE — Progress Notes (Addendum)
Pt reported that he typically takes Trazodone 50mg  every night at bedtime. Nurse shared that this medication hasn't been ordered. He requested that the provider be notified of this now, as he would get his wife to bring the medication up to unit, if need be. Nurse informed him that it is against hospital policy to bring outside meds up to unit. Prior to this concern, he requested medication for sleep. Shared with him that it is against hospital policy to give sleeping aide past 2am, as he requested this at 2:30am. Nurse also shared that he didn't have any medication ordered for sleep. Nurse shared that she would notify provider of his concerns- Anthoney Harada, NP. Nurse Maurilio Lovely about these concerns. Anthoney Harada, NP stated that she could order Melatonin. However, hospital policy, we don't give it at this hour of the day. No new order for Trazodone at present time. Informed pt of this. He then requested to speak with someone "higher up". Nurse informed him that she will let the AC/Supervisor know of his concerns. Nurse also updated charge nurse of events- Carla. Nurse spoke with [facility] and shared with her the events, as stated above. She stated that she would be up to the pt's room to speak with him. Nurse informed pt that the AC/Supervisor will be up to speak with him. He stated "okay".

## 2023-07-24 NOTE — Discharge Summary (Signed)
Physician Discharge Summary   Patient: Derek [name] MRN: [id number] DOB: 09-May-1941  Admit date:     07/19/2023  Discharge date: 07/24/23  Discharge Physician: Alba Cory   PCP: Assunta Found, MD   Recommendations at discharge:    Follow up for renal function.  Avoid Flexeril.   Discharge Diagnoses: Principal Problem:   Encephalopathy acute Active Problems:   UTI (urinary tract infection)   Atrial fibrillation (HCC)   CKD (chronic kidney disease) stage 3, GFR 30-59 ml/min (HCC)   B12 deficiency  Resolved Problems:   * No resolved hospital problems. *  Hospital Course: 82 year old with past medical history significant for paroxysmal A-fib on Eliquis, nonischemic cardiomyopathy, remote PE, stage III CKD, history of ascending aneurysm, PTSD, hypertension presents from home with concern for confusion. Patient has been having flank pain for about 6 weeks, recently seen at the [facility] urgent care where UA and urine culture were obtained, he received a call from the emergency room and advised to presented for further evaluation. Urine culture was growing resistance organism, Morganella and Pseudomonas.   Assessment and Plan: 1-Acute metabolic encephalopathy could be in setting of flexeril. UTI was ruled out.  Improving Could be related to muscle relaxant ?    2- Bacteruria not consistent with UTI. Urinary tract infection: Was ruled out. ID doesn't think patient has UTI>  Patient presented with flank pain for 6 weeks now was evaluated in the ED PA and urine culture was growing Pseudomonas and Morganella.  Pseudomonas was pansensitive, Morganella is resistant to penicillin and cefazolin. -received Cefepime for 4 days.   MRI only showed arthritis.   3-A-fib: Continue with amiodarone and Eliquis   CKD stage IIIb: Creatinine baseline 1.9-2.1 Renal function is stable. Metabolic acidosis: Continue with  bicarb supplements   Hypomagnesemia replaced   Back pain: ID recommend  MRI Lumbar spine.    B12 deficiency: Continue with B12 supplements Estimated body mass index is 26.64 kg/m as calculated from the following:   Height as of this encounter: 5\' 11"  (1.803 m).   Weight as of this encounter: 86.6 kg.   Mood disorder.  Resume home dose trazadone 50 mg. His PCP has started him on taper./  I offer to give him lower dose prior to discharge.       Consultants: ID Procedures performed: None Disposition: Home Diet recommendation:  Discharge Diet Orders (From admission, onward)     Start     Ordered   07/24/23 0000  Diet - low sodium heart healthy        07/24/23 0928           Cardiac diet DISCHARGE MEDICATION: Allergies as of 07/24/2023       Reactions   Lorazepam Other (See Comments)   Hallucinations    Other Other (See Comments)   Cannot have ct scans with dye due to kidney function   Humira [adalimumab] Other (See Comments)   Pt states that he got pancreatitis because of this    Colchicine Other (See Comments)   Cramps   Quinolones Other (See Comments)   Patient was warned about not using Cipro and similar antibiotics. Recent studies have raised concern that fluoroquinolone antibiotics could be associated with an increased risk of aortic aneurysm Fluoroquinolones have non-antimicrobial properties that might jeopardise the integrity of the extracellular matrix of the vascular wall In a  propensity score matched cohort study in [place], there was a 66% increased rate of aortic aneurysm or dissection associated with oral  fluoroquinolone use, compared wit        Medication List     STOP taking these medications    calcium carbonate 500 MG chewable tablet Commonly known as: TUMS - dosed in mg elemental calcium   cyclobenzaprine 10 MG tablet Commonly known as: FLEXERIL   tamsulosin 0.4 MG Caps capsule Commonly known as: FLOMAX       TAKE these medications    apixaban 2.5 MG Tabs tablet Commonly known as: ELIQUIS Take 1  tablet (2.5 mg total) by mouth 2 (two) times daily. What changed:  medication strength how much to take   cetirizine 10 MG tablet Commonly known as: ZYRTEC Take 10 mg by mouth daily.   cyanocobalamin 500 MCG tablet Commonly known as: VITAMIN B12 Take 2 tablets by mouth daily. What changed: Another medication with the same name was removed. Continue taking this medication, and follow the directions you see here.   famotidine 40 MG tablet Commonly known as: PEPCID Take 40 mg by mouth daily before breakfast.   fluticasone 50 MCG/ACT nasal spray Commonly known as: FLONASE Place 2 sprays into both nostrils as needed for allergies or rhinitis.   hydrALAZINE 25 MG tablet Commonly known as: APRESOLINE Take 25 mg by mouth daily.   Magnesium Oxide 420 MG Tabs Take 1,260 mg by mouth in the morning and at bedtime.   multivitamin with minerals Tabs tablet Take 1 tablet by mouth daily with breakfast.   ondansetron 4 MG tablet Commonly known as: ZOFRAN Take 4 mg by mouth as needed for nausea or vomiting.   Pacerone 200 MG tablet Generic drug: amiodarone Take 1 tablet by mouth daily.   pantoprazole 40 MG tablet Commonly known as: PROTONIX Take 1 tablet (40 mg total) by mouth 2 (two) times daily for 30 days, THEN 1 tablet (40 mg total) daily. Start taking on: May 09, 2023   PARoxetine 40 MG tablet Commonly known as: PAXIL Take 20 mg by mouth in the morning.   rosuvastatin 10 MG tablet Commonly known as: Crestor Take 1 tablet (10 mg total) by mouth daily. What changed: how much to take   sodium bicarbonate 650 MG tablet Take 1,950 mg by mouth in the morning and at bedtime.   sucralfate 1 GM/10ML suspension Commonly known as: CARAFATE Take 1 g by mouth as needed (to coat the stomach).   Synthroid 75 MCG tablet Generic drug: levothyroxine Take 75 mcg by mouth every morning. What changed: Another medication with the same name was removed. Continue taking this medication,  and follow the directions you see here.   traZODone 50 MG tablet Commonly known as: DESYREL Take 1 tablet (50 mg total) by mouth at bedtime. What changed:  medication strength how much to take   TYLENOL 500 MG tablet Generic drug: acetaminophen Take 1,000 mg by mouth as needed for mild pain or headache.   Uloric 80 MG Tabs Generic drug: Febuxostat Take 80 mg by mouth in the morning.   Vitamin D 50 MCG (2000 UT) Caps Take 2,000 Units by mouth daily.        Discharge Exam: Filed Weights   07/19/23 1246  Weight: 86.6 kg   General; NAD  Condition at discharge: stable  The results of significant diagnostics from this hospitalization (including imaging, microbiology, ancillary and laboratory) are listed below for reference.   Imaging Studies: MR LUMBAR SPINE WO CONTRAST Result Date: 07/24/2023 CLINICAL DATA:  Low back pain with infection suspected EXAM: MRI LUMBAR SPINE WITHOUT CONTRAST TECHNIQUE:  Multiplanar, multisequence MR imaging of the lumbar spine was performed. No intravenous contrast was administered. COMPARISON:  None Available. FINDINGS: Segmentation:  Standard. Alignment:  Physiologic. Vertebrae:  L2 hemangioma.  No fracture.  No discitis-osteomyelitis. Conus medullaris and cauda equina: Conus extends to the L1 level. Conus and cauda equina appear normal. Paraspinal and other soft tissues: Negative. Disc levels: L1-L2: Normal disc space and facet joints. No spinal canal stenosis. No neural foraminal stenosis. L2-L3: Normal disc space and facet joints. No spinal canal stenosis. No neural foraminal stenosis. L3-L4: Normal disc space and facet joints. No spinal canal stenosis. No neural foraminal stenosis. L4-L5: Small disc bulge. No spinal canal stenosis. No neural foraminal stenosis. L5-S1: Minimal disc bulge. No spinal canal stenosis. No neural foraminal stenosis. Visualized sacrum: Normal. IMPRESSION: 1. No acute abnormality of the lumbar spine. 2. Minimal lower lumbar  degenerative disc disease without spinal canal or neural foraminal stenosis. Electronically Signed   By: Deatra Robinson M.D.   On: 07/24/2023 00:03   CT Renal Stone Study Result Date: 07/19/2023 CLINICAL DATA:  Right-sided flank pain EXAM: CT ABDOMEN AND PELVIS WITHOUT CONTRAST TECHNIQUE: Multidetector CT imaging of the abdomen and pelvis was performed following the standard protocol without IV contrast. RADIATION DOSE REDUCTION: This exam was performed according to the departmental dose-optimization program which includes automated exposure control, adjustment of the mA and/or kV according to patient size and/or use of iterative reconstruction technique. COMPARISON:  CT 05/16/2023, 03/27/2021 FINDINGS: Lower chest: Lung bases demonstrate no acute airspace disease. Stable small Peri fissural left lower lobe pulmonary nodule on series 5, image 5, no imaging follow-up is recommended. No acute airspace disease. Hepatobiliary: Cholecystectomy. No focal hepatic abnormality or biliary dilatation. Pancreas: Unremarkable. No pancreatic ductal dilatation or surrounding inflammatory changes. Spleen: Normal in size without focal abnormality. Adrenals/Urinary Tract: Adrenal glands are normal. Kidneys show no hydronephrosis. Cortical scarring within the kidneys. Multiple hypodense renal cysts, no imaging follow-up is recommended. Small bilateral kidney stones. No ureteral stone. The bladder is unremarkable Stomach/Bowel: The stomach is unremarkable. Fluid-filled bowel without wall thickening or convincing obstruction. Status post right hemicolectomy. Vascular/Lymphatic: Moderate aortic atherosclerosis. No aneurysm. No suspicious lymph nodes Reproductive: Prostate is unremarkable. Other: Negative for pelvic effusion or free air. Scarring within the anterior abdominal wall Musculoskeletal: No acute or suspicious osseous abnormality. IMPRESSION: 1. Negative for hydronephrosis or hydroureter. Small bilateral kidney stones. 2.  Status post right hemicolectomy. Fluid-filled bowel without wall thickening or convincing obstruction. 3. Aortic atherosclerosis. Aortic Atherosclerosis (ICD10-I70.0). Electronically Signed   By: Jasmine Pang M.D.   On: 07/19/2023 22:29   CT HEAD WO CONTRAST ( ) Result Date: 07/13/2023 CLINICAL DATA:  Altered mental status.  Falling. EXAM: CT HEAD WITHOUT CONTRAST TECHNIQUE: Contiguous axial images were obtained from the base of the skull through the vertex without intravenous contrast. RADIATION DOSE REDUCTION: This exam was performed according to the departmental dose-optimization program which includes automated exposure control, adjustment of the mA and/or kV according to patient size and/or use of iterative reconstruction technique. COMPARISON:  08/12/2021 FINDINGS: Brain: Age related volume loss. No evidence of old or acute focal infarction, mass lesion, hemorrhage, hydrocephalus or extra-axial collection. Vascular: There is atherosclerotic calcification of the major vessels at the base of the brain. Skull: Negative Sinuses/Orbits: Clear/normal Other: None IMPRESSION: No acute CT finding. Age related volume loss.  No change. Electronically Signed   By: Paulina Fusi M.D.   On: 07/13/2023 11:41    Microbiology: Results for orders placed or performed during the hospital  encounter of 07/19/23  Urine Culture     Status: Abnormal (Preliminary result)   Collection Time: 07/19/23  1:45 PM   Specimen: Urine, Clean Catch  Result Value Ref Range Status   Specimen Description   Final    URINE, CLEAN CATCH Performed at [facility], [address] [place], [place] [address]    Special Requests   Final    NONE Performed at [facility], [address], [place], [place] [address]    Culture (A)  Final    50,000 COLONIES/mL PSEUDOMONAS AERUGINOSA 20,000 COLONIES/mL MORGANELLA MORGANII Sent to [facility] for further susceptibility testing. Performed at [facility], [address], [place], [place] [address]    Report Status PENDING  Incomplete   Organism ID, Bacteria PSEUDOMONAS AERUGINOSA (A)  Final   Organism ID, Bacteria MORGANELLA MORGANII (A)  Final      Susceptibility   Morganella morganii - MIC*    AMPICILLIN >=32 RESISTANT Resistant     CEFTAZIDIME 2 SENSITIVE Sensitive     CIPROFLOXACIN 0.5 INTERMEDIATE Intermediate     GENTAMICIN <=1 SENSITIVE Sensitive     IMIPENEM 4 SENSITIVE Sensitive     NITROFURANTOIN 128 RESISTANT Resistant     TRIMETH/SULFA <=20 SENSITIVE Sensitive     AMPICILLIN/SULBACTAM >=32 RESISTANT Resistant     PIP/TAZO <=4 SENSITIVE Sensitive ug/mL    * 20,000 COLONIES/mL MORGANELLA MORGANII   Pseudomonas aeruginosa - MIC*    CEFTAZIDIME 2 SENSITIVE Sensitive     CIPROFLOXACIN 0.5 SENSITIVE Sensitive     GENTAMICIN 2 SENSITIVE Sensitive     IMIPENEM 1 SENSITIVE Sensitive     PIP/TAZO 8 SENSITIVE Sensitive ug/mL    CEFEPIME 2 SENSITIVE Sensitive     * 50,000 COLONIES/mL PSEUDOMONAS AERUGINOSA  MIC (1 Drug)-urine culture; 07/19/2023; Urine; morganella; Other; cefepime     Status: None   Collection Time: 07/19/23  1:45 PM   Specimen: Urine  Result Value Ref Range Status   Min Inhibitory Conc (1 Drug) Preliminary report  Final    Comment: (NOTE) Performed At: [facility] [address] [place], [place] [id number] Jolene Schimke MD MW:[id number]    Source URINE, RANDOM  Final    Comment: Performed at [facility], [address], [place], [place] [address]    Labs: CBC: Recent Labs  Lab 07/19/23 1817 07/20/23 0530 07/22/23 0508 07/23/23 0434 07/24/23 0527  WBC 7.1 4.6 5.0 4.4 4.7  NEUTROABS 5.4  --   --   --   --   HGB 11.5* 10.0* 10.3* 10.2* 10.8*  HCT 37.0* 32.7* 33.8* 32.6* 34.7*  MCV 91.6 92.6 92.3 91.6 90.6  PLT 142* 108* 113* 106* 114*   Basic Metabolic Panel: Recent Labs  Lab 07/19/23 1340 07/20/23 0530 07/21/23 0457 07/22/23 0508 07/24/23 0527  NA 139 138 136 136  138  K 4.6 4.5 4.4 4.6 4.2  CL 109 110 109 109 110  CO2 19* 22 20* 17* 18*  GLUCOSE 99 127* 97 91 93  BUN 20 22 21 18 19   CREATININE 2.31* 2.11* 1.98* 1.94* 1.85*  CALCIUM 8.8* 8.6* 8.6* 8.6* 8.6*  MG  --   --  1.5* 1.8  --    Liver Function Tests: Recent Labs  Lab 07/19/23 1340  AST 28  ALT 16  ALKPHOS 129*  BILITOT 0.6  PROT 6.7  ALBUMIN 3.9   CBG: No results for input(s): "GLUCAP" in the last 168 hours.  Discharge time spent:  greater than 30 minutes.  Signed: Alba Cory, MD [facility] 07/24/2023

## 2023-07-27 LAB — MIC RESULT

## 2023-07-27 LAB — MINIMUM INHIBITORY CONC. (1 DRUG)

## 2023-07-29 LAB — URINE CULTURE: Culture: 50000 — AB

## 2023-08-06 DIAGNOSIS — H8110 Benign paroxysmal vertigo, unspecified ear: Secondary | ICD-10-CM | POA: Diagnosis not present

## 2023-08-06 DIAGNOSIS — R296 Repeated falls: Secondary | ICD-10-CM | POA: Diagnosis not present

## 2023-08-06 DIAGNOSIS — A858 Other specified viral encephalitis: Secondary | ICD-10-CM | POA: Diagnosis not present

## 2023-08-06 DIAGNOSIS — Z6826 Body mass index (BMI) 26.0-26.9, adult: Secondary | ICD-10-CM | POA: Diagnosis not present

## 2023-08-16 DIAGNOSIS — N1832 Chronic kidney disease, stage 3b: Secondary | ICD-10-CM | POA: Diagnosis not present

## 2023-08-23 ENCOUNTER — Encounter (HOSPITAL_COMMUNITY): Payer: Self-pay

## 2023-08-23 ENCOUNTER — Ambulatory Visit (HOSPITAL_COMMUNITY): Payer: Medicare PPO

## 2023-08-30 ENCOUNTER — Ambulatory Visit (HOSPITAL_COMMUNITY)
Admission: RE | Admit: 2023-08-30 | Discharge: 2023-08-30 | Disposition: A | Discharge status: Home / Self Care | Payer: No Typology Code available for payment source | Source: Ambulatory Visit | Attending: Physician Assistant | Admitting: Physician Assistant

## 2023-08-30 DIAGNOSIS — R109 Unspecified abdominal pain: Secondary | ICD-10-CM | POA: Diagnosis present

## 2023-09-13 ENCOUNTER — Encounter (HOSPITAL_COMMUNITY): Payer: Self-pay | Admitting: *Deleted

## 2023-09-13 ENCOUNTER — Other Ambulatory Visit: Payer: Self-pay

## 2023-09-13 ENCOUNTER — Emergency Department (HOSPITAL_COMMUNITY)
Admission: EM | Admit: 2023-09-13 | Discharge: 2023-10-02 | Disposition: E | Payer: No Typology Code available for payment source | Attending: Emergency Medicine | Admitting: Emergency Medicine

## 2023-09-13 DIAGNOSIS — I469 Cardiac arrest, cause unspecified: Secondary | ICD-10-CM | POA: Insufficient documentation

## 2023-09-13 DIAGNOSIS — Z7901 Long term (current) use of anticoagulants: Secondary | ICD-10-CM | POA: Diagnosis not present

## 2023-09-13 DIAGNOSIS — N183 Chronic kidney disease, stage 3 unspecified: Secondary | ICD-10-CM | POA: Insufficient documentation

## 2023-09-13 DIAGNOSIS — R7309 Other abnormal glucose: Secondary | ICD-10-CM | POA: Insufficient documentation

## 2023-09-13 LAB — CBG MONITORING, ED: Glucose-Capillary: 166 mg/dL — ABNORMAL HIGH (ref 70–99)

## 2023-09-13 MED ORDER — EPINEPHRINE 1 MG/10ML IJ SOSY
1.0000 mg | PREFILLED_SYRINGE | Freq: Once | INTRAMUSCULAR | Status: AC
  Administered 2023-09-13: 1 mg via INTRAVENOUS

## 2023-09-13 MED ORDER — SODIUM BICARBONATE 8.4 % IV SOLN
50.0000 meq | Freq: Once | INTRAVENOUS | Status: AC
  Administered 2023-09-13: 50 meq via INTRAVENOUS

## 2023-09-27 ENCOUNTER — Ambulatory Visit: Payer: Medicare PPO | Admitting: Physician Assistant

## 2023-10-02 NOTE — ED Triage Notes (Signed)
 Pt BIB [facility] from home for cardiac arrest; ems reports when they arrived pt was found lying on floor with agonal breaths and no pulse; cpr was started at [phone]  EMS reports they were called to the house last night for patient had complaints of dizziness and lightheaded  Pt was given epi drip by ems with 5 rounds of epinephrine  given   Pt was intubated in the field with 7.5 ETT, 22 at the lip  Asystole in the field with ems  0837 pulse check, asystole 0839 amp of epinephrine  administered 0839 amp of sodium bicarb administered 0840 pulse check, asystole 0841 amp of epinephrine  administered 0843 pulse check, asystole 0843 time of death called  Family at bedside during CPR

## 2023-10-02 NOTE — ED Provider Notes (Signed)
 [place] EMERGENCY DEPARTMENT AT [facility] Provider Note   CSN: [id number] Arrival date & time: September 27, 2023  6962     History  Chief Complaint  Patient presents with   Cardiac Arrest    Abram Plesha is a 83 y.o. male.  HPI    83 year old male comes in with chief complaint of cardiac arrest.  Per EMS, when they arrived, patient was found to have agonal respirations, no pulse and they initiated CPR.  CPR was initiated at 7:35 AM.  There was a call for EMS yesterday from the family when patient was complaining of dizziness.  Patient's vital signs were stable, and transportation to hospital was declined.  Per our records Mr. Nabors has past medical history significant for paroxysmal A-fib on Eliquis , nonischemic cardiomyopathy, remote PE, stage III CKD, stable ascending aneurysm.  According to the wife, who is also at the bedside, patient woke up this morning, went to the bathroom.  He took a heavy breath, which got her attention.  When she went to the bathroom, patient was breathing heavily, was clammy and unresponsive.  Per EMS, patient has had continuous CPR since 7:35 AM.  He is on epi drip and has received 4 or 5 additional epi pushes.  Home Medications Prior to Admission medications   Medication Sig Start Date End Date Taking? Authorizing Provider  amiodarone  (PACERONE ) 200 MG tablet Take 1 tablet by mouth daily. 06/02/23   [provider]  apixaban  (ELIQUIS ) 2.5 MG TABS tablet Take 1 tablet (2.5 mg total) by mouth 2 (two) times daily. 07/24/23   Regalado, Belkys A, MD  cetirizine (ZYRTEC) 10 MG tablet Take 10 mg by mouth daily. 06/02/23   [provider]  Cholecalciferol  (VITAMIN D ) 50 MCG (2000 UT) CAPS Take 2,000 Units by mouth daily.    [provider]  cyanocobalamin  (VITAMIN B12) 500 MCG tablet Take 2 tablets by mouth daily. 06/02/23   [provider]  famotidine  (PEPCID ) 40 MG tablet Take 40 mg by mouth daily  before breakfast.    [provider]  Febuxostat  (ULORIC ) 80 MG TABS Take 80 mg by mouth in the morning.    [provider]  fluticasone (FLONASE) 50 MCG/ACT nasal spray Place 2 sprays into both nostrils as needed for allergies or rhinitis.    [provider]  hydrALAZINE  (APRESOLINE ) 25 MG tablet Take 25 mg by mouth daily.    [provider]  levothyroxine  (SYNTHROID ) 75 MCG tablet Take 75 mcg by mouth every morning. Patient not taking: Reported on 07/20/2023 06/16/23   [provider]  Magnesium  Oxide 420 MG TABS Take 1,260 mg by mouth in the morning and at bedtime.    [provider]  Multiple Vitamin (MULTIVITAMIN WITH MINERALS) TABS tablet Take 1 tablet by mouth daily with breakfast.    [provider]  ondansetron  (ZOFRAN ) 4 MG tablet Take 4 mg by mouth as needed for nausea or vomiting.    Minus Amel, MD  pantoprazole  (PROTONIX ) 40 MG tablet Take 1 tablet (40 mg total) by mouth 2 (two) times daily for 30 days, THEN 1 tablet (40 mg total) daily. 05/09/23 07/20/23  Aura Leeds Latif, DO  PARoxetine  (PAXIL ) 40 MG tablet Take 20 mg by mouth in the morning. 05/07/21   [provider]  rosuvastatin  (CRESTOR ) 10 MG tablet Take 1 tablet (10 mg total) by mouth daily. Patient taking differently: Take 5 mg by mouth daily. 10/17/20   Hugh Madura, MD  sodium  bicarbonate 650 MG tablet Take 1,950 mg by mouth in the morning and at bedtime.    [provider]  sucralfate  (CARAFATE ) 1 GM/10ML suspension Take 1 g by mouth as needed (to coat the stomach).    [provider]  traZODone  (DESYREL ) 50 MG tablet Take 1 tablet (50 mg total) by mouth at bedtime. 07/24/23   Regalado, Belkys A, MD  TYLENOL  500 MG tablet Take 1,000 mg by mouth as needed for mild pain or headache.    [provider]  Calcium  Carbonate (CALCIUM  500 PO) Take 2 tablets by mouth 2 (two) times daily.   05/11/16  [provider]       Allergies    Lorazepam, Other, Humira [adalimumab], Colchicine , and Quinolones    Review of Systems   Review of Systems  Physical Exam Updated Vital Signs There were no vitals taken for this visit. Physical Exam Constitutional:      Appearance: He is well-developed.     Comments: Unresponsive, GCS 3  Eyes:     Comments: Unequal pupils, left dilated at 4 mm, right at 2 mm  Cardiovascular:     Comments: Pulseless Pulmonary:     Comments: ET tube in place, bilateral chest rise, easy bagging Abdominal:     Palpations: Abdomen is soft.  Skin:    Comments: Cool, pale  Neurological:     Mental Status: He is oriented to person, place, and time.     Comments: GCS - 3     ED Results / Procedures / Treatments   Labs (all labs ordered are listed, but only abnormal results are displayed) Labs Reviewed  CBG MONITORING, ED - Abnormal; Notable for the following components:      Result Value   Glucose-Capillary 166 (*)    All other components within normal limits    EKG None  Radiology No results found.  Procedures CPR  Date/Time: 23-Sep-2023 8:59 AM  Performed by: Deatra Face, MD Authorized by: Deatra Face, MD  CPR Procedure Details:      Amount of time prior to administration of ACLS/BLS (minutes):  5   ACLS/BLS initiated by EMS: Yes     CPR/ACLS performed in the ED: Yes     Duration of CPR (minutes):  8   Outcome: Pt declared dead    CPR performed via ACLS guidelines under my direct supervision.  See RN documentation for details including defibrillator use, medications, doses and timing. Comments:     2 rounds of epi in the emergency room along with amp of bicarb. Patient remained in asystole     Medications Ordered in ED Medications - No data to display  ED Course/ Medical Decision Making/ A&P                                 Medical Decision Making  Mr. Vonwald is a 83 year old male with past medical history significant for paroxysmal A-fib on  Eliquis , nonischemic cardiomyopathy, remote PE, stage III CKD, and a ascending aneurysm.  He comes to the emergency room after being found by EMS having agonal respirations and no pulse.  There was a call made out to EMS yesterday for dizziness as well.  On exam, unequal pupil, pulseless patient who is pale, cool to touch.  Patient already intubated on the field.  Has received multiple rounds of epi and close to an hour of CPR with constant asystole.  Differential diagnosis:  Ruptured aneurysm, massive stroke, brain hemorrhage, dysrhythmia.  We continued CPR in the emergency room.  Patient received additional epi, bicarb.  He remained in asystole here.  Wife is at the bedside.   With CPR going over 1 hour, persistent asystole for the entirety of CPR -we ceased the CPR/ACLS efforts at 8:43 AM.  I consulted Dr. Glady Laming and spoke with him.  He is the patient's PCP.  They will sign the death certificate.  Final Clinical Impression(s) / ED Diagnoses Final diagnoses:  Cardiac arrest [facility])    Rx / DC Orders ED Discharge Orders     None         Deatra Face, MD 09/15/2023 [id number]

## 2023-10-02 DEATH — deceased
